# Patient Record
Sex: Male | Born: 1970 | Race: White | Hispanic: No | Marital: Married | Smoking: Never smoker
Health system: Southern US, Community
[De-identification: ages and names within clinical notes are randomized; demographics above are authoritative.]

## PROBLEM LIST (undated history)

## (undated) DIAGNOSIS — I4510 Unspecified right bundle-branch block: Secondary | ICD-10-CM

## (undated) DIAGNOSIS — K219 Gastro-esophageal reflux disease without esophagitis: Secondary | ICD-10-CM

## (undated) DIAGNOSIS — M722 Plantar fascial fibromatosis: Secondary | ICD-10-CM

## (undated) DIAGNOSIS — G4733 Obstructive sleep apnea (adult) (pediatric): Secondary | ICD-10-CM

## (undated) DIAGNOSIS — E669 Obesity, unspecified: Secondary | ICD-10-CM

## (undated) DIAGNOSIS — M109 Gout, unspecified: Secondary | ICD-10-CM

## (undated) DIAGNOSIS — Z6841 Body Mass Index (BMI) 40.0 and over, adult: Secondary | ICD-10-CM

## (undated) DIAGNOSIS — Z8739 Personal history of other diseases of the musculoskeletal system and connective tissue: Secondary | ICD-10-CM

## (undated) DIAGNOSIS — R5383 Other fatigue: Secondary | ICD-10-CM

## (undated) DIAGNOSIS — T4145XA Adverse effect of unspecified anesthetic, initial encounter: Secondary | ICD-10-CM

## (undated) DIAGNOSIS — I509 Heart failure, unspecified: Secondary | ICD-10-CM

## (undated) DIAGNOSIS — E7800 Pure hypercholesterolemia, unspecified: Secondary | ICD-10-CM

## (undated) DIAGNOSIS — I10 Essential (primary) hypertension: Secondary | ICD-10-CM

## (undated) DIAGNOSIS — R0789 Other chest pain: Secondary | ICD-10-CM

## (undated) DIAGNOSIS — E119 Type 2 diabetes mellitus without complications: Secondary | ICD-10-CM

## (undated) DIAGNOSIS — E349 Endocrine disorder, unspecified: Secondary | ICD-10-CM

## (undated) DIAGNOSIS — N529 Male erectile dysfunction, unspecified: Secondary | ICD-10-CM

## (undated) DIAGNOSIS — Z9989 Dependence on other enabling machines and devices: Secondary | ICD-10-CM

## (undated) DIAGNOSIS — I2510 Atherosclerotic heart disease of native coronary artery without angina pectoris: Secondary | ICD-10-CM

## (undated) DIAGNOSIS — R9431 Abnormal electrocardiogram [ECG] [EKG]: Secondary | ICD-10-CM

## (undated) HISTORY — DX: Other fatigue: R53.83

## (undated) HISTORY — DX: Body Mass Index (BMI) 40.0 and over, adult: Z684

## (undated) HISTORY — DX: Gastro-esophageal reflux disease without esophagitis: K21.9

## (undated) HISTORY — DX: Other chest pain: R07.89

## (undated) HISTORY — DX: Essential (primary) hypertension: I10

## (undated) HISTORY — DX: Plantar fascial fibromatosis: M72.2

## (undated) HISTORY — DX: Obesity, unspecified: E66.9

## (undated) HISTORY — DX: Endocrine disorder, unspecified: E34.9

## (undated) HISTORY — DX: Male erectile dysfunction, unspecified: N52.9

## (undated) HISTORY — DX: Gout, unspecified: M10.9

## (undated) HISTORY — DX: Abnormal electrocardiogram (ECG) (EKG): R94.31

## (undated) HISTORY — PX: LUMBAR SPINE SURGERY: SHX701

## (undated) HISTORY — DX: Unspecified right bundle-branch block: I45.10

## (undated) HISTORY — DX: Obstructive sleep apnea (adult) (pediatric): G47.33

## (undated) HISTORY — PX: BACK SURGERY: SHX140

## (undated) HISTORY — PX: KNEE ARTHROSCOPY: SHX127

---

## 1993-06-19 DIAGNOSIS — T8859XA Other complications of anesthesia, initial encounter: Secondary | ICD-10-CM

## 1993-06-19 HISTORY — DX: Other complications of anesthesia, initial encounter: T88.59XA

## 1993-06-19 HISTORY — PX: VASECTOMY: SHX75

## 2015-03-08 ENCOUNTER — Ambulatory Visit (INDEPENDENT_AMBULATORY_CARE_PROVIDER_SITE_OTHER): Payer: Managed Care, Other (non HMO) | Admitting: Cardiovascular Disease

## 2015-03-08 ENCOUNTER — Encounter: Payer: Self-pay | Admitting: Cardiovascular Disease

## 2015-03-08 VITALS — BP 130/100 | HR 93 | Ht 75.0 in | Wt 386.8 lb

## 2015-03-08 DIAGNOSIS — M722 Plantar fascial fibromatosis: Secondary | ICD-10-CM | POA: Insufficient documentation

## 2015-03-08 DIAGNOSIS — E349 Endocrine disorder, unspecified: Secondary | ICD-10-CM | POA: Insufficient documentation

## 2015-03-08 DIAGNOSIS — M109 Gout, unspecified: Secondary | ICD-10-CM | POA: Insufficient documentation

## 2015-03-08 DIAGNOSIS — R0789 Other chest pain: Secondary | ICD-10-CM | POA: Insufficient documentation

## 2015-03-08 DIAGNOSIS — I4510 Unspecified right bundle-branch block: Secondary | ICD-10-CM

## 2015-03-08 DIAGNOSIS — R5383 Other fatigue: Secondary | ICD-10-CM | POA: Insufficient documentation

## 2015-03-08 DIAGNOSIS — I10 Essential (primary) hypertension: Secondary | ICD-10-CM | POA: Diagnosis not present

## 2015-03-08 DIAGNOSIS — N529 Male erectile dysfunction, unspecified: Secondary | ICD-10-CM | POA: Insufficient documentation

## 2015-03-08 DIAGNOSIS — G4733 Obstructive sleep apnea (adult) (pediatric): Secondary | ICD-10-CM | POA: Insufficient documentation

## 2015-03-08 DIAGNOSIS — R9431 Abnormal electrocardiogram [ECG] [EKG]: Secondary | ICD-10-CM

## 2015-03-08 DIAGNOSIS — K219 Gastro-esophageal reflux disease without esophagitis: Secondary | ICD-10-CM | POA: Insufficient documentation

## 2015-03-08 DIAGNOSIS — E669 Obesity, unspecified: Secondary | ICD-10-CM | POA: Insufficient documentation

## 2015-03-08 NOTE — Patient Instructions (Addendum)
Estelle Grumbles, Nutritionist  [phone]   Medication Instructions:  Your physician recommends that you continue on your current medications as directed. Please refer to the Current Medication list given to you today.   Labwork: None Ordered   Testing/Procedures: None Ordered   Follow-Up: Your physician recommends that you schedule a follow-up appointment in: as needed with Dr. Acie Fredrickson.

## 2015-03-08 NOTE — Progress Notes (Signed)
Cardiology Office Note   Date:  03/08/2015   ID:  Joe [name], DOB July 13, 1970, MRN [id number]  PCP:  Fae Pippin  Cardiologist:   Nahser, Wonda Cheng, MD   Chief Complaint  Patient presents with  . Abnormal ECG   Problem LIst 1. RBBB 2. Chronic back pain  3.  Mild OSA      History of Present Illness: Joe [name] is a 44 y.o. male who presents for evaluation of a RBBB that was noted incidentally on has physical.    Has occasional episodes of musculoskeletal type chest pain. Does not get any regular exercise.  His job is occasionally active.   Has cut back on his salt recently .    eatslots of fast foods. Most lunches are fast food  Does not have any CP with exertion. Has some DOE .      Past Medical History  Diagnosis Date  . Fatigue   . Obstructive sleep apnea   . Plantar fasciitis   . GERD (gastroesophageal reflux disease)   . Erectile dysfunction   . Essential hypertension, benign   . Obesity   . Testosterone deficiency   . Gout   . Abnormal EKG   . Right bundle branch block   . Muscular chest pain   . Body mass index 45.0-49.9, adult     Past Surgical History  Procedure Laterality Date  . Vasectomy       Current Outpatient Prescriptions  Medication Sig Dispense Refill  . allopurinol (ZYLOPRIM) 100 MG tablet Take 100 mg by mouth daily.    . indomethacin (INDOCIN) 50 MG capsule Take 50 mg by mouth 3 (three) times daily as needed.     . meloxicam (MOBIC) 15 MG tablet Take 15 mg by mouth daily.    Marland Kitchen omeprazole (PRILOSEC) 40 MG capsule Take 40 mg by mouth daily.    . TESTOSTERONE IM Inject 200 mg into the muscle every 14 (fourteen) days.     No current facility-administered medications for this visit.    Allergies:   Review of patient's allergies indicates no known allergies.    Social History:  The patient  reports that he has never smoked. He does not have any smokeless tobacco history on file.   Family History:  The patient's  family history includes Diabetes Mellitus II in his brother and paternal grandfather; Lymphoma in his father.    ROS:  Please see the history of present illness.    Review of Systems: Constitutional:  denies fever, chills, diaphoresis, appetite change and fatigue.  HEENT: denies photophobia, eye pain, redness, hearing loss, ear pain, congestion, sore throat, rhinorrhea, sneezing, neck pain, neck stiffness and tinnitus.  Respiratory: admits to  DOE,    Cardiovascular: denies chest pain, palpitations and leg swelling.  Gastrointestinal: denies nausea, vomiting, abdominal pain, diarrhea, constipation, blood in stool.  Genitourinary: denies dysuria, urgency, frequency, hematuria, flank pain and difficulty urinating.  Musculoskeletal: denies  myalgias, back pain, joint swelling, arthralgias and gait problem.   Skin: denies pallor, rash and wound.  Neurological: denies dizziness, seizures, syncope, weakness, light-headedness, numbness and headaches.   Hematological: denies adenopathy, easy bruising, personal or family bleeding history.  Psychiatric/ Behavioral: denies suicidal ideation, mood changes, confusion, nervousness, sleep disturbance and agitation.       All other systems are reviewed and negative.    PHYSICAL EXAM: VS:  BP 130/100 mmHg  Pulse 93  Ht 6\' 3"  (1.905 m)  Wt 175.451 kg (386 lb 12.8 oz)  BMI 48.35 kg/m2 , BMI Body mass index is 48.35 kg/(m^2). GEN: Well nourished, well developed, in no acute distress HEENT: normal Neck: no JVD, carotid bruits, or masses Cardiac: RRR; no murmurs, rubs, or gallops,no edema  Respiratory:  clear to auscultation bilaterally, normal work of breathing GI: soft, nontender, nondistended, + BS MS: no deformity or atrophy Skin: warm and dry, no rash Neuro:  Strength and sensation are intact Psych: normal   EKG:  EKG is ordered today. The ekg ordered today demonstrates NSR,  RBBB   Recent Labs: No results found for requested labs  within last 365 days.    Lipid Panel No results found for: CHOL, TRIG, HDL, CHOLHDL, VLDL, LDLCALC, LDLDIRECT    Wt Readings from Last 3 Encounters:  03/08/15 175.451 kg (386 lb 12.8 oz)      Other studies Reviewed: Additional studies/ records that were reviewed today include: . Review of the above records demonstrates:    ASSESSMENT AND PLAN:  1.  Right bundle branch block: Patient presents with right bundle branch block. At this point he does not have any signs or symptoms of congestive heart failure or coronary artery disease. I've reassured him that this is a benign condition at this time. He'll be getting EKGs on a regular basis. Ive recommended that he start a regular exercise program. I've advised him to call me back if he has any episodes of chest pain or shortness breath and is not able to advance in his exercise program. We'll see him back on an as-needed basis.    2. Elevated blood pressure: The patient has intermittent episodes of elevated blood pressure. He eats a relatively high salt diet. He eats fast food 3 orally. I've recommended that he take his lunch to work. Recommended weight loss program.     Current medicines are reviewed at length with the patient today.  The patient does not have concerns regarding medicines.  The following changes have been made:  no change  Labs/ tests ordered today include:  No orders of the defined types were placed in this encounter.     Disposition:   FU with me as needed      Nahser, Wonda Cheng, MD  03/08/2015 10:26 AM    [facility] [place], [place], [place]  [address] Phone: [phone]; Fax: [phone]   [facility]  [address] [place], [place]  [address] [phone]   Fax [phone]

## 2015-03-31 ENCOUNTER — Ambulatory Visit: Payer: Self-pay | Admitting: Cardiology

## 2015-08-16 DIAGNOSIS — M79642 Pain in left hand: Secondary | ICD-10-CM | POA: Insufficient documentation

## 2015-08-16 DIAGNOSIS — G5602 Carpal tunnel syndrome, left upper limb: Secondary | ICD-10-CM | POA: Insufficient documentation

## 2015-08-27 DIAGNOSIS — G5601 Carpal tunnel syndrome, right upper limb: Secondary | ICD-10-CM | POA: Insufficient documentation

## 2017-08-31 ENCOUNTER — Telehealth: Payer: Self-pay

## 2017-08-31 NOTE — Telephone Encounter (Signed)
REFERRAL SENT TO [place]

## 2017-09-05 ENCOUNTER — Ambulatory Visit (HOSPITAL_BASED_OUTPATIENT_CLINIC_OR_DEPARTMENT_OTHER)
Admission: RE | Admit: 2017-09-05 | Discharge: 2017-09-05 | Disposition: A | Discharge status: Home / Self Care | Payer: BLUE CROSS/BLUE SHIELD | Source: Ambulatory Visit | Attending: Cardiology | Admitting: Cardiology

## 2017-09-05 ENCOUNTER — Encounter: Payer: Self-pay | Admitting: Cardiology

## 2017-09-05 ENCOUNTER — Ambulatory Visit (INDEPENDENT_AMBULATORY_CARE_PROVIDER_SITE_OTHER): Payer: BLUE CROSS/BLUE SHIELD | Admitting: Cardiology

## 2017-09-05 VITALS — BP 120/88 | HR 112 | Ht 75.0 in | Wt 386.8 lb

## 2017-09-05 DIAGNOSIS — R0602 Shortness of breath: Secondary | ICD-10-CM

## 2017-09-05 DIAGNOSIS — R918 Other nonspecific abnormal finding of lung field: Secondary | ICD-10-CM | POA: Insufficient documentation

## 2017-09-05 DIAGNOSIS — G4733 Obstructive sleep apnea (adult) (pediatric): Secondary | ICD-10-CM | POA: Diagnosis not present

## 2017-09-05 DIAGNOSIS — R9431 Abnormal electrocardiogram [ECG] [EKG]: Secondary | ICD-10-CM

## 2017-09-05 DIAGNOSIS — I209 Angina pectoris, unspecified: Secondary | ICD-10-CM | POA: Insufficient documentation

## 2017-09-05 DIAGNOSIS — R072 Precordial pain: Secondary | ICD-10-CM | POA: Diagnosis not present

## 2017-09-05 DIAGNOSIS — I10 Essential (primary) hypertension: Secondary | ICD-10-CM

## 2017-09-05 DIAGNOSIS — E118 Type 2 diabetes mellitus with unspecified complications: Secondary | ICD-10-CM | POA: Diagnosis not present

## 2017-09-05 DIAGNOSIS — I4510 Unspecified right bundle-branch block: Secondary | ICD-10-CM

## 2017-09-05 DIAGNOSIS — R079 Chest pain, unspecified: Secondary | ICD-10-CM

## 2017-09-05 HISTORY — DX: Angina pectoris, unspecified: I20.9

## 2017-09-05 HISTORY — DX: Shortness of breath: R06.02

## 2017-09-05 HISTORY — DX: Type 2 diabetes mellitus with unspecified complications: E11.8

## 2017-09-05 MED ORDER — ASPIRIN EC 81 MG PO TBEC: 81.0000 mg | DELAYED_RELEASE_TABLET | Freq: Every day | ORAL | 3 refills | Status: DC

## 2017-09-05 MED ORDER — ROSUVASTATIN CALCIUM 40 MG PO TABS: 40.0000 mg | ORAL_TABLET | Freq: Every evening | ORAL | 3 refills | Status: DC

## 2017-09-05 MED ORDER — NITROGLYCERIN 0.4 MG SL SUBL: 0.4000 mg | SUBLINGUAL_TABLET | SUBLINGUAL | 11 refills | Status: DC | PRN

## 2017-09-05 NOTE — Progress Notes (Signed)
Cardiology Consultation:    Date:  09/05/2017   ID:  Joe [name], DOB 11/13/1970, MRN [id number]  PCP:  Cyndi Bender, PA-C  Cardiologist:  Jenne Campus, MD   Referring MD: Cyndi Bender, PA-C   Chief Complaint  Patient presents with  . Shortness of Breath  . Chest Pain  I have a chest pain  History of Present Illness:    Joe [name] is a 47 y.o. male who is being seen today for the evaluation of chest pain at the request of Cyndi Bender, PA-C.  For last 2 weeks he experienced exertional chest tightness.  This is associated with significant shortness of breath.  He comes to my office with his wife and she clearly states that when he goes outside goes from one building to the other building he will get tightness in the chest to the point that he needed to slow down and also significant shortness of breath.  He kind of down complained of symptoms he said he had it all the time however he admits that definitely within last 2 weeks this situation became significantly worse.  He ended up going to his primary care physician who recognized new onset of diabetes as well as hypertension and started appropriate therapy for it.  He was also referred to [place].  Also one episode of chest pain happening at rest.  That happened a few days ago.  Not exercise on the regular basis he is at work and does not required much physical effort. Have history of significant sleep apnea however have difficulty with the equipment and basically does not use any CPAP mask now.  He is also morbidly obese.  Past Medical History:  Diagnosis Date  . Abnormal EKG   . Body mass index 45.0-49.9, adult ([place])   . Diabetes mellitus without complication ([place])   . Erectile dysfunction   . Essential hypertension, benign   . Fatigue   . GERD (gastroesophageal reflux disease)   . Gout   . Muscular chest pain   . Obesity   . Obstructive sleep apnea   . Plantar fasciitis   . Right bundle branch block   .  Testosterone deficiency     Past Surgical History:  Procedure Laterality Date  . BACK SURGERY    . KNEE SURGERY    . VASECTOMY      Current Medications: Current Meds  Medication Sig  . allopurinol (ZYLOPRIM) 100 MG tablet Take 100 mg by mouth daily.  . indomethacin (INDOCIN) 50 MG capsule Take 50 mg by mouth 3 (three) times daily as needed.   . meloxicam (MOBIC) 15 MG tablet Take 15 mg by mouth daily.  . metoprolol succinate (TOPROL-XL) 25 MG 24 hr tablet Take 1 tablet by mouth daily.  Marland Kitchen omeprazole (PRILOSEC) 40 MG capsule Take 40 mg by mouth daily.  . TESTOSTERONE IM Inject 200 mg into the muscle every 14 (fourteen) days.     Allergies:   Patient has no known allergies.   Social History   Socioeconomic History  . Marital status: Married    Spouse name: None  . Number of children: None  . Years of education: None  . Highest education level: None  Social Needs  . Financial resource strain: None  . Food insecurity - worry: None  . Food insecurity - inability: None  . Transportation needs - medical: None  . Transportation needs - non-medical: None  Occupational History  . None  Tobacco Use  . Smoking status: Never  Smoker  . Smokeless tobacco: Never Used  Substance and Sexual Activity  . Alcohol use: No    Frequency: Never  . Drug use: No  . Sexual activity: None  Other Topics Concern  . None  Social History Narrative  . None     Family History: The patient's family history includes Brain cancer in his mother; Diabetes Mellitus II in his brother and paternal grandfather; Lung cancer in his mother; Lymphoma in his father. ROS:   Please see the history of present illness.    All 14 point review of systems negative except as described per history of present illness.  EKGs/Labs/Other Studies Reviewed:    The following studies were reviewed today:   EKG:  EKG is  ordered today.  The ekg ordered today demonstrates normal sinus rhythm normal PR interval, right  bundle branch block left anterior hemiblock.  Recent Labs: No results found for requested labs within last 8760 hours.  Recent Lipid Panel No results found for: CHOL, TRIG, HDL, CHOLHDL, VLDL, LDLCALC, LDLDIRECT  Physical Exam:    VS:  BP 120/88   Pulse (!) 112   Ht 6\' 3"  (1.905 m)   Wt (!) 386 lb 12.8 oz (175.5 kg)   SpO2 95%   BMI 48.35 kg/m     Wt Readings from Last 3 Encounters:  09/05/17 (!) 386 lb 12.8 oz (175.5 kg)  03/08/15 (!) 386 lb 12.8 oz (175.5 kg)     GEN:  Well nourished, well developed in no acute distress HEENT: Normal NECK: No JVD; No carotid bruits LYMPHATICS: No lymphadenopathy CARDIAC: RRR, no murmurs, no rubs, no gallops RESPIRATORY:  Clear to auscultation without rales, wheezing or rhonchi  ABDOMEN: Soft, non-tender, non-distended MUSCULOSKELETAL:  No edema; No deformity  SKIN: Warm and dry NEUROLOGIC:  Alert and oriented x 3 PSYCHIATRIC:  Normal affect   ASSESSMENT:    1. Essential hypertension, benign   2. Type 2 diabetes mellitus with complication, without long-term current use of insulin ([place])   3. Right bundle branch block   4. Obstructive sleep apnea   5. Class 3 severe obesity due to excess calories without serious comorbidity in adult, unspecified BMI ([place])   6. Precordial pain    PLAN:    In order of problems listed above:  1. Exertional chest pain very suspicious for angina pectoris.  He does have multiple risk factors for coronary artery disease.  We discussed options for this situation option being medical therapy versus stress testing versus cardiac catheterization.  In his situation with his symptomatology in my opinion the best approach will be to proceed with cardiac catheterization.  Procedure was explained to him including all risk benefits as well as alternatives.  We will proceed.  In the meantime I will initiate aspirin.  He was given aspirin by his primary care physician but did not have a chance to take it I gave him samples  today and will initiate this medication right away.  Will give him nitroglycerin prescription.  He is already on beta-blocker which I will continue.  He denies having any recent chest pain.  Also told him until we do cardiac catheterization not to exert himself.  I will also start statin therapy.  His LDL was 110.  I will initiate Crestor 40 mg daily. 2. Type 2 diabetes: Recently recognized.  Metformin has been initiated. 3. Obstructive sleep apnea which appears to be severe.  Unable to tolerate mask.  Will continue discussion regarding that problem after he  comes back after cardiac catheterization. 4. Obesity: Significant problem I told him right away if he will be able to lose about 50 pounds his diabetes will likely significantly improve his cholesterol will be better overall his well-being will improve dramatically.  But again this is discussion that we will continue after cardiac catheterization will be done.  Apparently he sign up for the gym however I told him not to go there until the cardiac catheterization   Medication Adjustments/Labs and Tests Ordered: Current medicines are reviewed at length with the patient today.  Concerns regarding medicines are outlined above.  No orders of the defined types were placed in this encounter.  No orders of the defined types were placed in this encounter.   Signed, Park Liter, MD, [facility]. 09/05/2017 3:22 PM    [place]

## 2017-09-05 NOTE — Patient Instructions (Addendum)
Medication Instructions:  Your physician has recommended you make the following change in your medication:  START crestor 40 mg daily START aspirin 81 mg daily START nitroglycerin as needed for chest pain. If you start having chest pain, stop what you are doing and sit down. Put a nitroglycerin tablet under your tongue and let it dissolve. Wait 5 minutes. If you are still having chest pain, this can be done 2 more times. After the third nitroglycerin, call 911.   Labwork: Your physician recommends that you return for lab work today: INR  Testing/Procedures:   [facility] [address] [place] [address] Dept: [phone] Loc: [place]  09/05/2017  You are scheduled for a Cardiac Catheterization on Thursday, March 21 with Dr. Larae Grooms.  1. Please arrive at the [facility] (Main Entrance A) at [facility]: [address] [place], [place] [address] at 5:30 AM (two hours before your procedure to ensure your preparation). Free valet parking service is available.   Special note: Every effort is made to have your procedure done on time. Please understand that emergencies sometimes delay scheduled procedures.  2. Diet: Do not eat or drink anything after midnight prior to your procedure except sips of water to take medications.  3. Labs: None needed.  4. Medication instructions in preparation for your procedure:   Current Outpatient Medications (Endocrine & Metabolic):  .  TESTOSTERONE IM, Inject 200 mg into the muscle every 14 (fourteen) days. .  metFORMIN (GLUCOPHAGE-XR) 500 MG 24 hr tablet, Take 2 tablets by mouth 2 (two) times daily.  Current Outpatient Medications (Cardiovascular):  .  metoprolol succinate (TOPROL-XL) 25 MG 24 hr tablet, Take 1 tablet by mouth daily. .  nitroGLYCERIN (NITROSTAT) 0.4 MG SL tablet, Place 1 tablet (0.4 mg total) under  the tongue every 5 (five) minutes as needed for chest pain. .  rosuvastatin (CRESTOR) 40 MG tablet, Take 1 tablet (40 mg total) by mouth every evening.   Current Outpatient Medications (Analgesics):  .  allopurinol (ZYLOPRIM) 100 MG tablet, Take 100 mg by mouth daily. .  indomethacin (INDOCIN) 50 MG capsule, Take 50 mg by mouth 3 (three) times daily as needed.  .  meloxicam (MOBIC) 15 MG tablet, Take 15 mg by mouth daily. Marland Kitchen  aspirin EC 81 MG tablet, Take 1 tablet (81 mg total) by mouth daily.   Current Outpatient Medications (Other):  .  omeprazole (PRILOSEC) 40 MG capsule, Take 40 mg by mouth daily. *For reference purposes while preparing patient instructions.   Delete this med list prior to printing instructions for patient.*  Stop taking, Glucophage (Metformin) on Thursday, March 20.  Hold for 48 hours after procedure.   On the morning of your procedure, take your Aspirin and any morning medicines NOT listed above.  You may use sips of water.  5. Plan for one night stay--bring personal belongings. 6. Bring a current list of your medications and current insurance cards. 7. You MUST have a responsible person to drive you home. 8. Someone MUST be with you the first 24 hours after you arrive home or your discharge will be delayed. 9. Please wear clothes that are easy to get on and off and wear slip-on shoes.  Thank you for allowing [place] to care for you!   -- [facility]   Follow-Up: Your physician recommends that you schedule a follow-up appointment in: 1 month.  Any Other Special Instructions Will Be Listed Below (If Applicable).     If you need a refill on your cardiac medications before your next appointment, please call your pharmacy.

## 2017-09-06 ENCOUNTER — Other Ambulatory Visit (HOSPITAL_COMMUNITY): Payer: BLUE CROSS/BLUE SHIELD

## 2017-09-06 ENCOUNTER — Encounter (HOSPITAL_COMMUNITY): Payer: Self-pay | Admitting: Interventional Cardiology

## 2017-09-06 ENCOUNTER — Inpatient Hospital Stay (HOSPITAL_COMMUNITY)
Admission: AD | Disposition: A | Discharge status: Home / Self Care | Payer: Self-pay | Source: Ambulatory Visit | Attending: Interventional Cardiology

## 2017-09-06 ENCOUNTER — Inpatient Hospital Stay (HOSPITAL_COMMUNITY)
Admission: AD | Admit: 2017-09-06 | Discharge: 2017-09-08 | DRG: 246 | Disposition: A | Discharge status: Home / Self Care | Payer: BLUE CROSS/BLUE SHIELD | Source: Ambulatory Visit | Attending: Interventional Cardiology | Admitting: Interventional Cardiology

## 2017-09-06 ENCOUNTER — Other Ambulatory Visit: Payer: Self-pay

## 2017-09-06 DIAGNOSIS — I10 Essential (primary) hypertension: Secondary | ICD-10-CM | POA: Diagnosis present

## 2017-09-06 DIAGNOSIS — I2510 Atherosclerotic heart disease of native coronary artery without angina pectoris: Secondary | ICD-10-CM | POA: Diagnosis not present

## 2017-09-06 DIAGNOSIS — E669 Obesity, unspecified: Secondary | ICD-10-CM | POA: Diagnosis present

## 2017-09-06 DIAGNOSIS — Z807 Family history of other malignant neoplasms of lymphoid, hematopoietic and related tissues: Secondary | ICD-10-CM

## 2017-09-06 DIAGNOSIS — E118 Type 2 diabetes mellitus with unspecified complications: Secondary | ICD-10-CM | POA: Diagnosis not present

## 2017-09-06 DIAGNOSIS — K219 Gastro-esophageal reflux disease without esophagitis: Secondary | ICD-10-CM | POA: Diagnosis present

## 2017-09-06 DIAGNOSIS — Z833 Family history of diabetes mellitus: Secondary | ICD-10-CM

## 2017-09-06 DIAGNOSIS — I255 Ischemic cardiomyopathy: Secondary | ICD-10-CM | POA: Diagnosis present

## 2017-09-06 DIAGNOSIS — I209 Angina pectoris, unspecified: Secondary | ICD-10-CM | POA: Diagnosis present

## 2017-09-06 DIAGNOSIS — E119 Type 2 diabetes mellitus without complications: Secondary | ICD-10-CM | POA: Diagnosis present

## 2017-09-06 DIAGNOSIS — Z9861 Coronary angioplasty status: Secondary | ICD-10-CM

## 2017-09-06 DIAGNOSIS — I5021 Acute systolic (congestive) heart failure: Secondary | ICD-10-CM

## 2017-09-06 DIAGNOSIS — G4733 Obstructive sleep apnea (adult) (pediatric): Secondary | ICD-10-CM | POA: Diagnosis present

## 2017-09-06 DIAGNOSIS — Z6841 Body Mass Index (BMI) 40.0 and over, adult: Secondary | ICD-10-CM | POA: Diagnosis not present

## 2017-09-06 DIAGNOSIS — Z801 Family history of malignant neoplasm of trachea, bronchus and lung: Secondary | ICD-10-CM

## 2017-09-06 DIAGNOSIS — I5023 Acute on chronic systolic (congestive) heart failure: Secondary | ICD-10-CM | POA: Diagnosis present

## 2017-09-06 DIAGNOSIS — I110 Hypertensive heart disease with heart failure: Secondary | ICD-10-CM | POA: Diagnosis present

## 2017-09-06 DIAGNOSIS — I4510 Unspecified right bundle-branch block: Secondary | ICD-10-CM | POA: Diagnosis present

## 2017-09-06 DIAGNOSIS — I340 Nonrheumatic mitral (valve) insufficiency: Secondary | ICD-10-CM | POA: Diagnosis not present

## 2017-09-06 DIAGNOSIS — M109 Gout, unspecified: Secondary | ICD-10-CM | POA: Diagnosis present

## 2017-09-06 DIAGNOSIS — R0602 Shortness of breath: Secondary | ICD-10-CM | POA: Diagnosis present

## 2017-09-06 DIAGNOSIS — I429 Cardiomyopathy, unspecified: Secondary | ICD-10-CM | POA: Diagnosis not present

## 2017-09-06 DIAGNOSIS — I25119 Atherosclerotic heart disease of native coronary artery with unspecified angina pectoris: Secondary | ICD-10-CM | POA: Diagnosis present

## 2017-09-06 DIAGNOSIS — Z955 Presence of coronary angioplasty implant and graft: Secondary | ICD-10-CM

## 2017-09-06 DIAGNOSIS — Z808 Family history of malignant neoplasm of other organs or systems: Secondary | ICD-10-CM | POA: Diagnosis not present

## 2017-09-06 HISTORY — DX: Type 2 diabetes mellitus without complications: E11.9

## 2017-09-06 HISTORY — PX: CORONARY ANGIOPLASTY WITH STENT PLACEMENT: SHX49

## 2017-09-06 HISTORY — PX: LEFT HEART CATH AND CORONARY ANGIOGRAPHY: CATH118249

## 2017-09-06 HISTORY — DX: Obstructive sleep apnea (adult) (pediatric): G47.33

## 2017-09-06 HISTORY — DX: Pure hypercholesterolemia, unspecified: E78.00

## 2017-09-06 HISTORY — PX: CORONARY STENT INTERVENTION: CATH118234

## 2017-09-06 HISTORY — DX: Dependence on other enabling machines and devices: Z99.89

## 2017-09-06 HISTORY — DX: Personal history of other diseases of the musculoskeletal system and connective tissue: Z87.39

## 2017-09-06 HISTORY — DX: Atherosclerotic heart disease of native coronary artery without angina pectoris: I25.10

## 2017-09-06 HISTORY — DX: Acute systolic (congestive) heart failure: I50.21

## 2017-09-06 HISTORY — DX: Adverse effect of unspecified anesthetic, initial encounter: T41.45XA

## 2017-09-06 LAB — GLUCOSE, CAPILLARY
Glucose-Capillary: 217 mg/dL — ABNORMAL HIGH (ref 65–99)
Glucose-Capillary: 218 mg/dL — ABNORMAL HIGH (ref 65–99)
Glucose-Capillary: 263 mg/dL — ABNORMAL HIGH (ref 65–99)
Glucose-Capillary: 251 mg/dL — ABNORMAL HIGH (ref 65–99)
Glucose-Capillary: 189 mg/dL — ABNORMAL HIGH (ref 65–99)

## 2017-09-06 LAB — PROTIME-INR
INR: 1 (ref 0.8–1.2)
INR: 1.03
Prothrombin Time: 10.7 s (ref 9.1–12.0)
Prothrombin Time: 13.4 seconds (ref 11.4–15.2)

## 2017-09-06 LAB — POCT ACTIVATED CLOTTING TIME
Activated Clotting Time: 252 seconds
Activated Clotting Time: 417 seconds

## 2017-09-06 SURGERY — LEFT HEART CATH AND CORONARY ANGIOGRAPHY
Anesthesia: LOCAL

## 2017-09-06 MED ORDER — ALLOPURINOL 100 MG PO TABS: 100.0000 mg | ORAL_TABLET | Freq: Every day | ORAL | Status: DC | PRN

## 2017-09-06 MED ORDER — FENTANYL CITRATE (PF) 100 MCG/2ML IJ SOLN
INTRAMUSCULAR | Status: AC
  Filled 2017-09-06: qty 2

## 2017-09-06 MED ORDER — NITROGLYCERIN 1 MG/10 ML FOR IR/CATH LAB
INTRA_ARTERIAL | Status: DC | PRN
  Administered 2017-09-06 (×2): 200 ug via INTRACORONARY
  Administered 2017-09-06: 100 ug via INTRACORONARY

## 2017-09-06 MED ORDER — SODIUM CHLORIDE 0.9% FLUSH: 3.0000 mL | INTRAVENOUS | Status: DC | PRN

## 2017-09-06 MED ORDER — SODIUM CHLORIDE 0.9 % IV SOLN: 250.0000 mL | INTRAVENOUS | Status: DC | PRN

## 2017-09-06 MED ORDER — HEPARIN SODIUM (PORCINE) 1000 UNIT/ML IJ SOLN
INTRAMUSCULAR | Status: DC | PRN
  Administered 2017-09-06: 10000 [IU] via INTRAVENOUS
  Administered 2017-09-06: 6000 [IU] via INTRAVENOUS
  Administered 2017-09-06: 4000 [IU] via INTRAVENOUS

## 2017-09-06 MED ORDER — TIROFIBAN (AGGRASTAT) BOLUS VIA INFUSION
INTRAVENOUS | Status: DC | PRN
  Administered 2017-09-06: 4377.5 ug via INTRAVENOUS

## 2017-09-06 MED ORDER — IOPAMIDOL (ISOVUE-370) INJECTION 76%
INTRAVENOUS | Status: AC
  Filled 2017-09-06: qty 100

## 2017-09-06 MED ORDER — TICAGRELOR 90 MG PO TABS
ORAL_TABLET | ORAL | Status: AC
  Filled 2017-09-06: qty 2

## 2017-09-06 MED ORDER — ASPIRIN 81 MG PO CHEW: 81.0000 mg | CHEWABLE_TABLET | ORAL | Status: DC

## 2017-09-06 MED ORDER — SODIUM CHLORIDE 0.9% FLUSH
3.0000 mL | Freq: Two times a day (BID) | INTRAVENOUS | Status: DC
  Administered 2017-09-06: 3 mL via INTRAVENOUS

## 2017-09-06 MED ORDER — ONDANSETRON HCL 4 MG/2ML IJ SOLN: 4.0000 mg | Freq: Four times a day (QID) | INTRAMUSCULAR | Status: DC | PRN

## 2017-09-06 MED ORDER — SODIUM CHLORIDE 0.9 % WEIGHT BASED INFUSION: 1.0000 mL/kg/h | INTRAVENOUS | Status: DC

## 2017-09-06 MED ORDER — IOPAMIDOL (ISOVUE-370) INJECTION 76%
INTRAVENOUS | Status: DC | PRN
  Administered 2017-09-06: 170 mL via INTRA_ARTERIAL

## 2017-09-06 MED ORDER — FENTANYL CITRATE (PF) 100 MCG/2ML IJ SOLN
INTRAMUSCULAR | Status: DC | PRN
  Administered 2017-09-06: 25 ug via INTRAVENOUS

## 2017-09-06 MED ORDER — HEPARIN SODIUM (PORCINE) 1000 UNIT/ML IJ SOLN
INTRAMUSCULAR | Status: AC
  Filled 2017-09-06: qty 1

## 2017-09-06 MED ORDER — ROSUVASTATIN CALCIUM 40 MG PO TABS
40.0000 mg | ORAL_TABLET | Freq: Every evening | ORAL | Status: DC
  Administered 2017-09-06 – 2017-09-07 (×2): 40 mg via ORAL
  Filled 2017-09-06: qty 1
  Filled 2017-09-06 (×2): qty 2
  Filled 2017-09-06: qty 1

## 2017-09-06 MED ORDER — ASPIRIN EC 81 MG PO TBEC
81.0000 mg | DELAYED_RELEASE_TABLET | Freq: Every day | ORAL | Status: DC
  Administered 2017-09-07 – 2017-09-08 (×2): 81 mg via ORAL
  Filled 2017-09-06 (×2): qty 1

## 2017-09-06 MED ORDER — TIROFIBAN HCL IN NACL 5-0.9 MG/100ML-% IV SOLN
INTRAVENOUS | Status: AC
  Filled 2017-09-06: qty 100

## 2017-09-06 MED ORDER — TICAGRELOR 90 MG PO TABS
90.0000 mg | ORAL_TABLET | Freq: Two times a day (BID) | ORAL | Status: DC
  Administered 2017-09-06 – 2017-09-08 (×4): 90 mg via ORAL
  Filled 2017-09-06 (×4): qty 1

## 2017-09-06 MED ORDER — NITROGLYCERIN 1 MG/10 ML FOR IR/CATH LAB
INTRA_ARTERIAL | Status: AC
  Filled 2017-09-06: qty 10

## 2017-09-06 MED ORDER — THE SENSUOUS HEART BOOK
Freq: Once | Status: AC
  Administered 2017-09-07: 04:00:00
  Filled 2017-09-06: qty 1

## 2017-09-06 MED ORDER — HEPARIN (PORCINE) IN NACL 2-0.9 UNIT/ML-% IJ SOLN
INTRAMUSCULAR | Status: AC | PRN
  Administered 2017-09-06 (×2): 500 mL

## 2017-09-06 MED ORDER — HEPARIN (PORCINE) IN NACL 2-0.9 UNIT/ML-% IJ SOLN
INTRAMUSCULAR | Status: AC
  Filled 2017-09-06: qty 1000

## 2017-09-06 MED ORDER — HYDRALAZINE HCL 20 MG/ML IJ SOLN: 5.0000 mg | INTRAMUSCULAR | Status: AC | PRN

## 2017-09-06 MED ORDER — METOPROLOL SUCCINATE ER 25 MG PO TB24
25.0000 mg | ORAL_TABLET | Freq: Every day | ORAL | Status: DC
  Administered 2017-09-07: 10:00:00 25 mg via ORAL
  Filled 2017-09-06: qty 1

## 2017-09-06 MED ORDER — INSULIN ASPART 100 UNIT/ML ~~LOC~~ SOLN
0.0000 [IU] | Freq: Three times a day (TID) | SUBCUTANEOUS | Status: DC
  Administered 2017-09-06 (×2): 8 [IU] via SUBCUTANEOUS
  Administered 2017-09-07 (×2): 5 [IU] via SUBCUTANEOUS
  Administered 2017-09-07: 18:00:00 3 [IU] via SUBCUTANEOUS

## 2017-09-06 MED ORDER — ACETAMINOPHEN 325 MG PO TABS: 650.0000 mg | ORAL_TABLET | ORAL | Status: DC | PRN

## 2017-09-06 MED ORDER — TICAGRELOR 90 MG PO TABS
ORAL_TABLET | ORAL | Status: DC | PRN
  Administered 2017-09-06: 180 mg via ORAL

## 2017-09-06 MED ORDER — METOPROLOL TARTRATE 5 MG/5ML IV SOLN
INTRAVENOUS | Status: AC
  Filled 2017-09-06: qty 5

## 2017-09-06 MED ORDER — SODIUM CHLORIDE 0.9 % WEIGHT BASED INFUSION
3.0000 mL/kg/h | INTRAVENOUS | Status: DC
  Administered 2017-09-06: 3 mL/kg/h via INTRAVENOUS

## 2017-09-06 MED ORDER — FUROSEMIDE 10 MG/ML IJ SOLN
40.0000 mg | Freq: Two times a day (BID) | INTRAMUSCULAR | Status: DC
  Administered 2017-09-06 – 2017-09-08 (×5): 40 mg via INTRAVENOUS
  Filled 2017-09-06 (×4): qty 4

## 2017-09-06 MED ORDER — ANGIOPLASTY BOOK
Freq: Once | Status: AC
  Administered 2017-09-07: 04:00:00
  Filled 2017-09-06: qty 1

## 2017-09-06 MED ORDER — TIROFIBAN HCL IN NACL 5-0.9 MG/100ML-% IV SOLN
INTRAVENOUS | Status: AC | PRN
  Administered 2017-09-06: 09:00:00
  Administered 2017-09-06: 0.15 ug/kg/min via INTRAVENOUS

## 2017-09-06 MED ORDER — LIDOCAINE HCL (PF) 1 % IJ SOLN
INTRAMUSCULAR | Status: AC
  Filled 2017-09-06: qty 30

## 2017-09-06 MED ORDER — NITROGLYCERIN 0.4 MG SL SUBL: 0.4000 mg | SUBLINGUAL_TABLET | SUBLINGUAL | Status: DC | PRN

## 2017-09-06 MED ORDER — PANTOPRAZOLE SODIUM 40 MG PO TBEC
40.0000 mg | DELAYED_RELEASE_TABLET | Freq: Every day | ORAL | Status: DC
  Administered 2017-09-07 – 2017-09-08 (×2): 40 mg via ORAL
  Filled 2017-09-06 (×2): qty 1

## 2017-09-06 MED ORDER — MIDAZOLAM HCL 2 MG/2ML IJ SOLN
INTRAMUSCULAR | Status: DC | PRN
  Administered 2017-09-06: 2 mg via INTRAVENOUS

## 2017-09-06 MED ORDER — MIDAZOLAM HCL 2 MG/2ML IJ SOLN
INTRAMUSCULAR | Status: AC
  Filled 2017-09-06: qty 2

## 2017-09-06 MED ORDER — FUROSEMIDE 10 MG/ML IJ SOLN
INTRAMUSCULAR | Status: AC
  Filled 2017-09-06: qty 4

## 2017-09-06 MED ORDER — VERAPAMIL HCL 2.5 MG/ML IV SOLN
INTRAVENOUS | Status: DC | PRN
  Administered 2017-09-06: 10 mL via INTRA_ARTERIAL

## 2017-09-06 MED ORDER — ASPIRIN 81 MG PO CHEW: 81.0000 mg | CHEWABLE_TABLET | Freq: Every day | ORAL | Status: DC

## 2017-09-06 MED ORDER — SODIUM CHLORIDE 0.9% FLUSH
3.0000 mL | Freq: Two times a day (BID) | INTRAVENOUS | Status: DC
  Administered 2017-09-06 – 2017-09-07 (×3): 3 mL via INTRAVENOUS

## 2017-09-06 MED ORDER — METOPROLOL TARTRATE 5 MG/5ML IV SOLN
INTRAVENOUS | Status: DC | PRN
  Administered 2017-09-06 (×2): 5 mg via INTRAVENOUS

## 2017-09-06 MED ORDER — LIDOCAINE HCL (PF) 1 % IJ SOLN
INTRAMUSCULAR | Status: DC | PRN
  Administered 2017-09-06: 2 mL

## 2017-09-06 MED ORDER — LABETALOL HCL 5 MG/ML IV SOLN: 10.0000 mg | INTRAVENOUS | Status: AC | PRN

## 2017-09-06 MED ORDER — VERAPAMIL HCL 2.5 MG/ML IV SOLN
INTRAVENOUS | Status: AC
  Filled 2017-09-06: qty 2

## 2017-09-06 SURGICAL SUPPLY — 19 items
BALLN SAPPHIRE 2.25X15 (BALLOONS) ×2
BALLOON SAPPHIRE 2.25X15 (BALLOONS) ×1 IMPLANT
BAND ZEPHYR COMPRESS 30 LONG (HEMOSTASIS) ×2 IMPLANT
CATH IMPULSE 5F ANG/FL3.5 (CATHETERS) ×2 IMPLANT
CATH VISTA GUIDE 6FR XBLAD3.5 (CATHETERS) ×2 IMPLANT
GUIDEWIRE INQWIRE 1.5J.035X260 (WIRE) ×1 IMPLANT
INQWIRE 1.5J .035X260CM (WIRE) ×2
KIT ENCORE 26 ADVANTAGE (KITS) ×2 IMPLANT
KIT HEART LEFT (KITS) ×2 IMPLANT
KIT HEMO VALVE WATCHDOG (MISCELLANEOUS) ×2 IMPLANT
NEEDLE PERC 21GX4CM (NEEDLE) ×2 IMPLANT
PACK CARDIAC CATHETERIZATION (CUSTOM PROCEDURE TRAY) ×2 IMPLANT
SHEATH RAIN RADIAL 21G 6FR (SHEATH) ×2 IMPLANT
SHIELD RADPAD SCOOP 12X17 (MISCELLANEOUS) ×2 IMPLANT
STENT SYNERGY DES 2.25X28 (Permanent Stent) ×2 IMPLANT
STENT SYNERGY DES 2.25X32 (Permanent Stent) ×2 IMPLANT
STENT SYNERGY DES 2.5X12 (Permanent Stent) ×2 IMPLANT
TRANSDUCER W/STOPCOCK (MISCELLANEOUS) ×2 IMPLANT
TUBING CIL FLEX 10 FLL-RA (TUBING) ×2 IMPLANT

## 2017-09-06 NOTE — Care Management Note (Addendum)
Case Management Note  Patient Details  Name: Joe [name] MRN: [id number] Date of Birth: 12/20/1970  Subjective/Objective:  Pt presented for Chest Pain- s/p cath. Plan for home on Brilinta. Benefits Check completed. CM did provide pt with [employer].  [facility] [place] and medication is available. No further needs from CM at this time.                  Action/Plan: S/W TRACY @ [facility] RX # [phone]    BRILINTA 90 MG BID  COVER- YES  CO-PAY- $ 30.00  TIER- 3 DRUG  PRIOR APPROVAL- NO   PREFERRED PHARM[facility]Y [facility]   Expected Discharge Date:                  Expected Discharge Plan:  Home/Self Care  In-House Referral:  NA  Discharge planning Services  CM Consult  Post Acute Care Choice:  NA Choice offered to:  NA  DME Arranged:  N/A DME Agency:  NA  HH Arranged:  NA HH Agency:  NA  Status of Service:  Completed, signed off  If discussed at [place] of Stay Meetings, dates discussed:    Additional Comments:  Bethena Roys, RN 09/06/2017, 4:15 PM

## 2017-09-06 NOTE — Progress Notes (Signed)
Patient arrived to unit with shortness of breath. O2 sats 99-100% on 2liters oxygen [place]. Reviewed information on Brilinta and shortness of breath experienced by some patients. Patient stated ,"I can't catch my breath." which is the exact description used by patients who experience the side effect. Patient given diet coke to drink and experienced some relief. He had several episodes of shortness of breath, relieved with sips of diet coke.  Episodes lessened and patient maintained O2 sats 98-100 on room air. Encouraged to try having a can of diet coke when taking Brilinta this evening to see if that would decrease symptoms. Patient and wife verbalizing good understanding of information given.

## 2017-09-06 NOTE — Progress Notes (Signed)
[facility] BAND REMOVAL  LOCATION:    right radial  DEFLATED PER PROTOCOL:    Yes.    TIME BAND OFF / DRESSING APPLIED:    1500   SITE UPON ARRIVAL:    Level 0  SITE AFTER BAND REMOVAL:    Level 0  CIRCULATION SENSATION AND MOVEMENT:    Within Normal Limits   Yes.    COMMENTS:   Rechecked during shift with no change

## 2017-09-06 NOTE — Research (Signed)
CADFEM Informed Consent   Subject Name: Joe [name]  Subject met inclusion and exclusion criteria.  The informed consent form, study requirements and expectations were reviewed with the subject and questions and concerns were addressed prior to the signing of the consent form.  The subject verbalized understanding of the trail requirements.  The subject agreed to participate in the CADFEM trial and signed the informed consent.  The informed consent was obtained prior to performance of any protocol-specific procedures for the subject.  A copy of the signed informed consent was given to the subject and a copy was placed in the subject's medical record.  Christena Flake 09/06/2017, 06:47 AM

## 2017-09-06 NOTE — Progress Notes (Signed)
1045-post ambulating to rest room pt became diaphoretic, short of breath (o2 sat high 90's) increased WOB. Experienced quick recovery in recliner. Dr Irish Lack notified.

## 2017-09-06 NOTE — Progress Notes (Signed)
    Cath Lab Visit (complete for each Cath Lab visit)  Clinical Evaluation Leading to the Procedure:   ACS: No.  Non-ACS:    Anginal Classification: CCS III  Anti-ischemic medical therapy: Minimal Therapy (1 class of medications)  Non-Invasive Test Results: No non-invasive testing performed  Prior CABG: No previous CABG   Patient with sx concerning for angina.  PLan for cath per Dr. Agustin Cree.  Joe Booze, MD

## 2017-09-06 NOTE — Progress Notes (Signed)
Aggrastst off at 1109.

## 2017-09-06 NOTE — Progress Notes (Addendum)
Noted several episodes today when patients heart rate dropped to the 30s. Spoke to patient and wife, patient has been diagnosed with sleep apnea and has a CPAP but the mask is not the right one and patient will not use it. They discussed this with PCP Dr. Ovid Curd and he is working on getting him the proper mask so he can resume using his CPAP machine. Discussed the importance of using the machine and patient and wife verbalized understanding. Notifying provider of above information. [place] Notified Reino Bellis of above, no orders or change in care.

## 2017-09-07 ENCOUNTER — Inpatient Hospital Stay (HOSPITAL_COMMUNITY): Payer: BLUE CROSS/BLUE SHIELD

## 2017-09-07 DIAGNOSIS — I255 Ischemic cardiomyopathy: Secondary | ICD-10-CM | POA: Diagnosis present

## 2017-09-07 DIAGNOSIS — I2510 Atherosclerotic heart disease of native coronary artery without angina pectoris: Secondary | ICD-10-CM

## 2017-09-07 DIAGNOSIS — E118 Type 2 diabetes mellitus with unspecified complications: Secondary | ICD-10-CM

## 2017-09-07 DIAGNOSIS — G4733 Obstructive sleep apnea (adult) (pediatric): Secondary | ICD-10-CM

## 2017-09-07 DIAGNOSIS — Z9861 Coronary angioplasty status: Secondary | ICD-10-CM

## 2017-09-07 DIAGNOSIS — I209 Angina pectoris, unspecified: Secondary | ICD-10-CM

## 2017-09-07 DIAGNOSIS — I5021 Acute systolic (congestive) heart failure: Secondary | ICD-10-CM

## 2017-09-07 DIAGNOSIS — I340 Nonrheumatic mitral (valve) insufficiency: Secondary | ICD-10-CM

## 2017-09-07 DIAGNOSIS — I10 Essential (primary) hypertension: Secondary | ICD-10-CM

## 2017-09-07 HISTORY — DX: Atherosclerotic heart disease of native coronary artery without angina pectoris: I25.10

## 2017-09-07 HISTORY — DX: Ischemic cardiomyopathy: I25.5

## 2017-09-07 HISTORY — DX: Atherosclerotic heart disease of native coronary artery without angina pectoris: Z98.61

## 2017-09-07 LAB — ECHOCARDIOGRAM COMPLETE
Height: 75 in
Weight: 5936.55 oz

## 2017-09-07 LAB — GLUCOSE, CAPILLARY
Glucose-Capillary: 220 mg/dL — ABNORMAL HIGH (ref 65–99)
Glucose-Capillary: 211 mg/dL — ABNORMAL HIGH (ref 65–99)
Glucose-Capillary: 182 mg/dL — ABNORMAL HIGH (ref 65–99)
Glucose-Capillary: 214 mg/dL — ABNORMAL HIGH (ref 65–99)

## 2017-09-07 LAB — BASIC METABOLIC PANEL
Anion gap: 10 (ref 5–15)
BUN: 13 mg/dL (ref 6–20)
CO2: 26 mmol/L (ref 22–32)
Calcium: 8.9 mg/dL (ref 8.9–10.3)
Chloride: 99 mmol/L — ABNORMAL LOW (ref 101–111)
Creatinine, Ser: 1.03 mg/dL (ref 0.61–1.24)
GFR calc Af Amer: >60 mL/min (ref >60)
GFR calc non Af Amer: >60 mL/min (ref >60)
Glucose, Bld: 217 mg/dL — ABNORMAL HIGH (ref 65–99)
Potassium: 4.1 mmol/L (ref 3.5–5.1)
Sodium: 135 mmol/L (ref 135–145)

## 2017-09-07 LAB — CBC
HCT: 43.9 % (ref 39.0–52.0)
Hemoglobin: 15.2 g/dL (ref 13.0–17.0)
MCH: 31.5 pg (ref 26.0–34.0)
MCHC: 34.6 g/dL (ref 30.0–36.0)
MCV: 90.9 fL (ref 78.0–100.0)
Platelets: 224 10*3/uL (ref 150–400)
RBC: 4.83 MIL/uL (ref 4.22–5.81)
RDW: 12.6 % (ref 11.5–15.5)
WBC: 8.6 10*3/uL (ref 4.0–10.5)

## 2017-09-07 MED ORDER — CARVEDILOL 3.125 MG PO TABS
6.2500 mg | ORAL_TABLET | Freq: Two times a day (BID) | ORAL | Status: DC
  Administered 2017-09-07 – 2017-09-08 (×2): 6.25 mg via ORAL
  Filled 2017-09-07 (×2): qty 2

## 2017-09-07 MED ORDER — SPIRONOLACTONE 12.5 MG HALF TABLET
12.5000 mg | ORAL_TABLET | Freq: Every day | ORAL | Status: DC
  Administered 2017-09-07 – 2017-09-08 (×2): 12.5 mg via ORAL
  Filled 2017-09-07 (×3): qty 1

## 2017-09-07 MED ORDER — METFORMIN HCL 500 MG PO TABS: 500.0000 mg | ORAL_TABLET | Freq: Two times a day (BID) | ORAL | Status: DC

## 2017-09-07 MED ORDER — PERFLUTREN LIPID MICROSPHERE
1.0000 mL | INTRAVENOUS | Status: AC | PRN
  Administered 2017-09-07: 2 mL via INTRAVENOUS
  Filled 2017-09-07: qty 10

## 2017-09-07 MED ORDER — LIVING BETTER WITH HEART FAILURE BOOK
Freq: Once | Status: AC
  Administered 2017-09-07: 07:00:00

## 2017-09-07 MED ORDER — IRBESARTAN 75 MG PO TABS
75.0000 mg | ORAL_TABLET | Freq: Every day | ORAL | Status: DC
  Administered 2017-09-07 – 2017-09-08 (×2): 75 mg via ORAL
  Filled 2017-09-07 (×2): qty 1

## 2017-09-07 MED ORDER — LIVING WELL WITH DIABETES BOOK
Freq: Once | Status: AC
  Administered 2017-09-07: 13:00:00
  Filled 2017-09-07 (×2): qty 1

## 2017-09-07 MED ORDER — INSULIN ASPART 100 UNIT/ML ~~LOC~~ SOLN
0.0000 [IU] | Freq: Three times a day (TID) | SUBCUTANEOUS | Status: DC
  Administered 2017-09-08: 5 [IU] via SUBCUTANEOUS

## 2017-09-07 MED ORDER — INSULIN ASPART 100 UNIT/ML ~~LOC~~ SOLN
0.0000 [IU] | Freq: Every day | SUBCUTANEOUS | Status: DC
  Administered 2017-09-07: 22:00:00 2 [IU] via SUBCUTANEOUS

## 2017-09-07 MED FILL — Heparin Sodium (Porcine) 2 Unit/ML in Sodium Chloride 0.9%: INTRAMUSCULAR | Qty: 1000 | Status: AC

## 2017-09-07 NOTE — Progress Notes (Signed)
  Echocardiogram 2D Echocardiogram has been performed.  Jennette Dubin 09/07/2017, 9:30 AM

## 2017-09-07 NOTE — Progress Notes (Signed)
Inpatient Diabetes Program Recommendations  AACE/ADA: New Consensus Statement on Inpatient Glycemic Control (2015)  Target Ranges:  Prepandial:   less than 140 mg/dL      Peak postprandial:   less than 180 mg/dL (1-2 hours)      Critically ill patients:  140 - 180 mg/dL   Spoke with patient about DM. Patient reports being newly diagnosed last week at his PCP office and was told to take Metformin 500 mg BID. Patient reports his A1c was around 9%. Wife at bedside also reports having DM. Spoke with patient and wife about glucose and A1C goals. Discussed importance of checking CBGs and maintaining good CBG control to prevent long-term and short-term complications. Explained how hyperglycemia leads to damage within blood vessels which lead to the common complications seen with uncontrolled diabetes. Stressed to the patient the importance of improving glycemic control to prevent further complications from uncontrolled diabetes. Discussed impact of nutrition, exercise, stress, sickness, and medications on diabetes control. Discussed carbohydrates, carbohydrate goals per day and meal, along with portion sizes. Encouraged them to check glucose at least 2 times a day. Wife had questions about the freestyle Libre continuous glucose monitor. Encouraged patient and wife to seek out an Musician. Patient and wife verbalize understanding of information discussed and have no further questions at this time related to diabetes.  Thanks,  Tama Headings RN, MSN, [facility] Inpatient Diabetes Coordinator Team Pager [phone] (8a-5p)

## 2017-09-07 NOTE — Progress Notes (Signed)
Inpatient Diabetes Program Recommendations  AACE/ADA: New Consensus Statement on Inpatient Glycemic Control (2015)  Target Ranges:  Prepandial:   less than 140 mg/dL      Peak postprandial:   less than 180 mg/dL (1-2 hours)      Critically ill patients:  140 - 180 mg/dL   Results for ZANNIE, RUNKLE (MRN [id number]) as of 09/07/2017 12:28  Ref. Range 09/06/2017 05:53 09/06/2017 09:12 09/06/2017 12:08 09/06/2017 17:03 09/06/2017 20:52 09/07/2017 06:11 09/07/2017 12:14  Glucose-Capillary Latest Ref Range: 65 - 99 mg/dL 217 (H) 218 (H) 263 (H) 251 (H) 189 (H) 220 (H) 211 (H)   Review of Glycemic Control  Diabetes history: DM2 Outpatient Diabetes medications: Metformin 1000 mg BID Current orders for Inpatient glycemic control: Novolog 0-15 units TID with meals, Metformin 500 mg BID (start 09/09/17)  Inpatient Diabetes Program Recommendations:  Insulin - Basal: While inpatient, please consider ordering Lantus 10 units Q24H (starting now). HgbA1C: Please consider ordering an A1C to evaluate glycemic control over the past 2-3 months.  Thanks, Barnie Alderman, RN, MSN, CDE Diabetes Coordinator Inpatient Diabetes Program [phone] (Team Pager from 8am to 5pm)

## 2017-09-07 NOTE — Progress Notes (Addendum)
Progress Note  Patient Name: Joe [name] Date of Encounter: 09/07/2017  Primary Cardiologist: Dr Agustin Cree   Subjective   Just back from echo. No SOB at rest  Inpatient Medications    Scheduled Meds: . aspirin EC  81 mg Oral Daily  . furosemide  40 mg Intravenous BID  . insulin aspart  0-15 Units Subcutaneous TID WC  . metoprolol succinate  25 mg Oral Daily  . pantoprazole  40 mg Oral Daily  . rosuvastatin  40 mg Oral QPM  . sodium chloride flush  3 mL Intravenous Q12H  . ticagrelor  90 mg Oral BID   Continuous Infusions: . sodium chloride     PRN Meds: sodium chloride, acetaminophen, allopurinol, nitroGLYCERIN, ondansetron (ZOFRAN) IV, sodium chloride flush   Vital Signs    Vitals:   09/06/17 2000 09/06/17 2015 09/07/17 0324 09/07/17 0809  BP:  137/88 (!) 152/112 (!) 134/99  Pulse:  (!) 103 (!) 104 98  Resp: (!) 21 16 (!) 22 18  Temp:  97.8 F (36.6 C) 97.6 F (36.4 C) 98 F (36.7 C)  TempSrc:  Oral Oral Oral  SpO2:  94% 96% 94%  Weight:   (!) 371 lb 0.6 oz (168.3 kg)   Height:        Intake/Output Summary (Last 24 hours) at 09/07/2017 0855 Last data filed at 09/07/2017 0700 Gross per 24 hour  Intake 1146 ml  Output 2950 ml  Net -1804 ml   Filed Weights   09/06/17 0541 09/07/17 0324  Weight: (!) 386 lb (175.1 kg) (!) 371 lb 0.6 oz (168.3 kg)    Telemetry    NSR, ST- Personally Reviewed  ECG    09/07/17- NSR-96, LAD RBBB - Personally Reviewed  Physical Exam   GEN: Morbidly obese male, no acute distress.   Neck: No JVD Cardiac: RRR, no murmurs, rubs, or gallops.  Respiratory: Clear to auscultation bilaterally. GI: Soft, nontender, non-distended  MS: No edema; No deformity. Rt radial site without hematoma Neuro:  Nonfocal  Psych: Normal affect   Labs    Chemistry Recent Labs  Lab 09/07/17 0341  NA 135  K 4.1  CL 99*  CO2 26  GLUCOSE 217*  BUN 13  CREATININE 1.03  CALCIUM 8.9  GFRNONAA >60  GFRAA >60  ANIONGAP 10      Hematology Recent Labs  Lab 09/07/17 0341  WBC 8.6  RBC 4.83  HGB 15.2  HCT 43.9  MCV 90.9  MCH 31.5  MCHC 34.6  RDW 12.6  PLT 224    Cardiac EnzymesNo results for input(s): TROPONINI in the last 168 hours. No results for input(s): TROPIPOC in the last 168 hours.   BNPNo results for input(s): BNP, PROBNP in the last 168 hours.   DDimer No results for input(s): DDIMER in the last 168 hours.   Radiology    Dg Chest 2 View  Result Date: 09/06/2017 CLINICAL DATA:  Shortness of breath, right-sided chest pain, headache, and dizziness for the past 2 weeks. History of angina pectoris and CHF. Nonsmoker. EXAM: CHEST - 2 VIEW COMPARISON:  None in PACs FINDINGS: The lungs are well-expanded. The interstitial markings are mildly prominent. The cardiac silhouette is mildly enlarged. The pulmonary vascularity is not clearly engorged. The mediastinum is normal in width. There is no pleural effusion. The bony thorax exhibits no acute abnormality. IMPRESSION: Mild interstitial prominence may reflect acute bronchitic change. One cannot exclude low-grade pulmonary edema in the appropriate clinical setting. There is no alveolar pneumonia.  Electronically Signed   By: David  Martinique M.D.   On: 09/06/2017 09:27    Cardiac Studies   Echo pending-  See cath report 09/06/17-  Patient Profile     47 y.o. male with a history of obesity, OSA, NIDDM, RBBB, and HLD seen as an OP 09/05/17 with c/o dyspnea and chest pain c/w angina. Pt admitted for cath 09/06/17.   Assessment & Plan    Exertional angina- Presenting symptoms 09/05/17  CAD s/p PCI- Cath 09/06/17 showed moderate CFX disease (50%), moderate RCA disease (50% dRCA), and 75% mLAD and 90% dLAD. He underwent PCI with DES to both LAD lesions.  Acute systolic CHF- EF 95-28% at cath, elevated LVEDP at cath. He has diuresed 1.8L so far on Lasix 40 mg IV BID.   Obesity- BMI 48  Sleep apnea- Reportedly not compliant with C-pap  Type 2 NIDDM- On  Glucophage prior to admission  RBBB- Chronic  Plan: Dr Irish Lack felt the pt would need 24-48 hour of diuresis and adjustment of his medications, not ready for discharge today. Resume Glucophage 3/24. Change Toprol to Coreg  (DM and ICM), add ARB -Micardis is available as an OP. Pt will work with his PCP about C-pap mask replacement. He'll need a f/u echo in 2-3 months.  For questions or updates, please contact [place] Please consult [email or web address] for contact info under Cardiology/STEMI.      Signed, Kerin Ransom, PA-C  09/07/2017, 8:55 AM

## 2017-09-07 NOTE — Progress Notes (Signed)
CARDIAC REHAB PHASE I   PRE:  Rate/Rhythm: 107 ST    BP: sitting 149/107    SaO2:   MODE:  Ambulation: 800 ft   POST:  Rate/Rhythm: 119 ST    BP: sitting 141/83     SaO2:   Pt denied CP walking. He did have one instance of SOB after 600 ft with talking. HR 119 ST at the time. BP improved after walk, which made me question previous BPs accuracy. In depth education done with pt and wife. Discussed Brilinta, stent, restrictions, HF management, diet, ex, NTG, and CRPII. Pt with good reception and requests his referral be sent to [facility]. He understands importance of Brilinta. Encouraged him to read information today and ask questions tomorrow. To walk independently.  [phone]   [place], ACSM 09/07/2017 10:20 AM

## 2017-09-07 NOTE — Plan of Care (Signed)
  RD consulted for nutrition education regarding diabetes.   No results found for: HGBA1C   Spoke with pt at bedside (wife was sleeping soundly in recliner). He reports he was diagnosed with DM PTA at PCP's office. He was given a prescription for Metformin and a pamphlet for DM resources and support groups. Pt reports that he eats 3 meals per day and has cut back on fast food. Meals are usually eaten out and pt consumes foods such as [place], Hibachi, or pizza. Commonly consumed beverages are water, diet soda, and sweet and unsweetened tea, occasionally coffee with cream and sugar. 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  DM coordinator met with pt earlier; pt was able to teach back to this RD basics of DM self-management. RD also reinforced portion control and ways to make healthier choices at restaurants. Additionally, educated pt on low calorie beverage options.  Pt very receptive to recommendations, but verbalizes being overwhelmed with new information. Both he and his wife are receptive to attending outpatient diabetes self-management education classes. RD will place referral.  Case and plan discussed with RN.  RD provided "Heart Healthy, Consistent Carbohydrate" handout from the Academy of Nutrition and Dietetics. Reviewed patient's dietary recall. Provided examples on ways to decrease sodium and fat intake in diet. Discouraged intake of processed foods and use of salt shaker. Encouraged fresh fruits and vegetables as well as whole grain sources of carbohydrates to maximize fiber intake.  Discussed different food groups and their effects on blood sugar, emphasizing carbohydrate-containing foods. Provided list of carbohydrates and  recommended serving sizes of common foods.  Discussed importance of controlled and consistent carbohydrate intake throughout the day. Provided examples of ways to balance meals/snacks and encouraged intake of high-fiber, whole grain complex carbohydrates. Teach back method used.  Expect fair to good compliance.  Body mass index is 46.38 kg/m. Pt meets criteria for extreme obesity, class III based on current BMI.  Current diet order is Carb Modified, patient is consuming approximately 100% of meals at this time. Labs and medications reviewed. No further nutrition interventions warranted at this time. RD contact information provided. If additional nutrition issues arise, please re-consult RD.  Joe [name] A. Jimmye Norman, RD, LDN, CDE Pager: [phone] After hours Pager: [phone]

## 2017-09-08 DIAGNOSIS — I429 Cardiomyopathy, unspecified: Secondary | ICD-10-CM

## 2017-09-08 LAB — BASIC METABOLIC PANEL
Anion gap: 13 (ref 5–15)
BUN: 16 mg/dL (ref 6–20)
CO2: 25 mmol/L (ref 22–32)
Calcium: 8.6 mg/dL — ABNORMAL LOW (ref 8.9–10.3)
Chloride: 99 mmol/L — ABNORMAL LOW (ref 101–111)
Creatinine, Ser: 1.1 mg/dL (ref 0.61–1.24)
GFR calc Af Amer: >60 mL/min (ref >60)
GFR calc non Af Amer: >60 mL/min (ref >60)
Glucose, Bld: 203 mg/dL — ABNORMAL HIGH (ref 65–99)
Potassium: 4 mmol/L (ref 3.5–5.1)
Sodium: 137 mmol/L (ref 135–145)

## 2017-09-08 LAB — GLUCOSE, CAPILLARY: Glucose-Capillary: 220 mg/dL — ABNORMAL HIGH (ref 65–99)

## 2017-09-08 MED ORDER — SPIRONOLACTONE 25 MG PO TABS: 12.5000 mg | ORAL_TABLET | Freq: Every day | ORAL | 6 refills | Status: DC

## 2017-09-08 MED ORDER — FUROSEMIDE 40 MG PO TABS: 40.0000 mg | ORAL_TABLET | Freq: Every day | ORAL | 11 refills | Status: DC

## 2017-09-08 MED ORDER — TICAGRELOR 90 MG PO TABS: 90.0000 mg | ORAL_TABLET | Freq: Two times a day (BID) | ORAL | 11 refills | Status: DC

## 2017-09-08 MED ORDER — CARVEDILOL 6.25 MG PO TABS: 6.2500 mg | ORAL_TABLET | Freq: Two times a day (BID) | ORAL | 6 refills | Status: DC

## 2017-09-08 MED ORDER — IRBESARTAN 75 MG PO TABS: 75.0000 mg | ORAL_TABLET | Freq: Every day | ORAL | 6 refills | Status: DC

## 2017-09-08 NOTE — Discharge Summary (Signed)
Discharge Summary    Patient ID: Joe [name],  MRN: [id number], DOB/AGE: [birth year] 47 y.o.  Admit date: 09/06/2017 Discharge date: 09/08/2017  Primary Care Provider: Cyndi Bender Primary Cardiologist: Dr. Agustin Cree  Discharge Diagnoses    Principal Problem:   Angina pectoris [place]) Active Problems:   Obstructive sleep apnea   GERD (gastroesophageal reflux disease)   Essential hypertension, benign   Obesity   Right bundle branch block   Type 2 diabetes mellitus with complication, without long-term current use of insulin (HCC)   Acute systolic heart failure (HCC)   CAD S/P percutaneous coronary angioplasty   Ischemic cardiomyopathy   Allergies No Known Allergies  Diagnostic Studies/Procedures    Echo 09/07/17 Study Conclusions  - Left ventricle: Not well visualized, Definity utilized to help estimate EF. The cavity size was normal. Wall thickness was increased in a pattern of mild LVH. Systolic function was moderately reduced. The estimated ejection fraction was in the range of 35% to 40%. Diffuse hypokinesis. - Mitral valve: There was mild regurgitation. - Left atrium: The atrium was mildly dilated.  CORONARY STENT INTERVENTION 09/06/17  LEFT HEART CATH AND CORONARY ANGIOGRAPHY  Conclusion     Mid RCA lesion is 25% stenosed.  Ost RPDA to RPDA lesion is 50% stenosed.  Dist RCA lesion is 25% stenosed with 25% stenosed side branch in Post Atrio.  Prox Cx to Mid Cx lesion is 50% stenosed.  Dist LAD lesion is 40% stenosed.  Mid LAD-2 lesion is 90% stenosed.  A drug-eluting stent was successfully placed using a STENT SYNERGY DES 2.25X28.  Post intervention, there is a 0% residual stenosis.  More proximal Mid LAD-1 lesion is 75% stenosed.  A drug-eluting stent was successfully placed using a STENT SYNERGY DES 2.25X32.  A drug-eluting stent was successfully placed using a STENT SYNERGY DES 2.5X12, overlapping proximal edge of the above  stent.  Post intervention, there is a 0% residual stenosis.  There is moderate to severe left ventricular systolic dysfunction.  The left ventricular ejection fraction is 25-35% by visual estimate, although V-gram was suboptimal due to LV dilatation.  LV end diastolic pressure is moderately elevated.  Severe LAD disease treated with stents above. Residual diffuse distal/apical LAD disease. Acute systolic heart failure. He will need echo and diuresis. I expect he will be here for a few days in the hospital. He will need to be started on CHF meds as well. HR stayed elevated despite IV beta blocker. Unclear whether there could be some tachycardia component contributing to decreased LVEF.   Continue DAPT for 6-12 months. Higher likelihood of clopidogrel resistance given his size, so Brilinta was used.     History of Present Illness     47 y.o.malewith a history of obesity, OSA, NIDDM, RBBB, and HLD seen as an OP 09/05/17 with c/o dyspnea and chest pain c/w angina. Pt admitted for cath 09/06/17.  For the past 2 weeks, he experienced exertional chest tightness.  This is associated with significant shortness of breath.  When he goes outside from one building to the other building, he will get tightness in the chest to the point that he needed to slow down and also significant shortness of breath.  He kind of down complained of symptoms he said he had it all the time however he admits that definitely within last 2 weeks this situation became significantly worse.   Have history of significant sleep apnea however have difficulty with the equipment and basically does not use any  CPAP mask now.  He is also morbidly obese  Hospital Course     Consultants: None  1. Exertional angina/CAD s/p PCI - Cath 09/06/17 showed moderate CFX disease (50%), moderate RCA disease (50% dRCA), and 75% mLAD and 90% dLAD. He underwent PCI with DES to both LAD lesions. No recurrent chest pain. Ambulated well.    - Continue ASA, Brillinta and Crestor and BB.   2. Chronic systolic CHF/ICM - LVEF of 25-35% by cath. He diuresed 6.4L since admit. Weight down 22 lb (386-->364lb).  - He is euvolemic. Continue coreg, ivapro and spironolactone. Will discharge on po lasix 40mg  QD. Heart failure education given.  BMET during follow up.   3. Sinus arrhythmia - He does have some evidence of conduction system disease with IVCD and prolonged PR interval, pause (longest 3.38 sec) noted on telemetry during the early AM which was asymptomatic, although also in the setting of OSA. No further up titration of Coreg at this time.   4. Sleep apnea - He is working with PCP to get correct mask. Encouraged compliance and weight loss.   5. HLD - No results found for requested labs within last 8760 hours. managed by PCP. LDL goal less than 70. Continue statin.  6. DM - resume home meds  7. HTN - BP stable   The patient has been seen by Dr. Domenic Polite  today and deemed ready for discharge home. All follow-up appointments have been scheduled. Discharge medications are listed below.  _____________   Discharge Vitals Blood pressure 117/78, pulse 98, temperature 97.8 F (36.6 C), temperature source Oral, resp. rate 13, height 6\' 3"  (1.905 m), weight (!) 364 lb 3.2 oz (165.2 kg), SpO2 100 %.  Filed Weights   09/06/17 0541 09/07/17 0324 09/08/17 0318  Weight: (!) 386 lb (175.1 kg) (!) 371 lb 0.6 oz (168.3 kg) (!) 364 lb 3.2 oz (165.2 kg)    Labs & Radiologic Studies     CBC Recent Labs    09/07/17 0341  WBC 8.6  HGB 15.2  HCT 43.9  MCV 90.9  PLT 188   Basic Metabolic Panel Recent Labs    09/07/17 0341 09/08/17 0528  NA 135 137  K 4.1 4.0  CL 99* 99*  CO2 26 25  GLUCOSE 217* 203*  BUN 13 16  CREATININE 1.03 1.10  CALCIUM 8.9 8.6*    Dg Chest 2 View  Result Date: 09/06/2017 CLINICAL DATA:  Shortness of breath, right-sided chest pain, headache, and dizziness for the past 2 weeks. History of  angina pectoris and CHF. Nonsmoker. EXAM: CHEST - 2 VIEW COMPARISON:  None in PACs FINDINGS: The lungs are well-expanded. The interstitial markings are mildly prominent. The cardiac silhouette is mildly enlarged. The pulmonary vascularity is not clearly engorged. The mediastinum is normal in width. There is no pleural effusion. The bony thorax exhibits no acute abnormality. IMPRESSION: Mild interstitial prominence may reflect acute bronchitic change. One cannot exclude low-grade pulmonary edema in the appropriate clinical setting. There is no alveolar pneumonia. Electronically Signed   By: David  Martinique M.D.   On: 09/06/2017 09:27    Disposition   Pt is being discharged home today in good condition.  Follow-up Plans & Appointments    Follow-up Information    [facility] Follow up.   Specialty:  Cardiology Why:  office will call with time and date of appointment with Dr. Agustin Cree. Please call us if you did not heard by Tuesday noon  Contact information: [phone], [place]         Discharge Instructions    Amb Referral to Cardiac Rehabilitation   Complete by:  As directed    Diagnosis:   PTCA Coronary Stents     Amb Referral to Nutrition and Diabetic E   Complete by:  As directed    Diet - low sodium heart healthy   Complete by:  As directed    Discharge instructions   Complete by:  As directed    No driving for 48 hours. No lifting over 5 lbs for 1 week. No sexual activity for 1 week. You may return to work on 09/17/17. Keep procedure site clean & dry. If you notice increased pain, swelling, bleeding or pus, call/return!  You may shower, but no soaking baths/hot tubs/pools for 1 week.   Hold metformin today. Resume tomorrow 09/09/17.  *Weigh yourself on the same scale at same time of day and keep a log. *Report weight gain of > 3 lbs in 1 day or 5 lbs over the course of a week and/or symptoms of excess  fluid (shortness of breath, difficulty lying flat, swelling, poor appetite, abdominal fullness/bloating, etc) to your doctor immediately. *Avoid foods that are high in sodium (processed, pre-packaged/canned goods, fast foods, etc). *Please attend all scheduled and reccommended follow up appointments   Increase activity slowly   Complete by:  As directed       Discharge Medications   Allergies as of 09/08/2017   No Known Allergies     Medication List    STOP taking these medications   indomethacin 50 MG capsule Commonly known as:  INDOCIN   meloxicam 15 MG tablet Commonly known as:  MOBIC   metoprolol succinate 25 MG 24 hr tablet Commonly known as:  TOPROL-XL     TAKE these medications   allopurinol 100 MG tablet Commonly known as:  ZYLOPRIM Take 100 mg by mouth daily as needed (gout).   aspirin EC 81 MG tablet Take 1 tablet (81 mg total) by mouth daily.   carvedilol 6.25 MG tablet Commonly known as:  COREG Take 1 tablet (6.25 mg total) by mouth 2 (two) times daily with a meal.   furosemide 40 MG tablet Commonly known as:  LASIX Take 1 tablet (40 mg total) by mouth daily.   irbesartan 75 MG tablet Commonly known as:  AVAPRO Take 1 tablet (75 mg total) by mouth daily.   metFORMIN 500 MG 24 hr tablet Commonly known as:  GLUCOPHAGE-XR Take 1,000 mg by mouth 2 (two) times daily.   nitroGLYCERIN 0.4 MG SL tablet Commonly known as:  NITROSTAT Place 1 tablet (0.4 mg total) under the tongue every 5 (five) minutes as needed for chest pain.   omeprazole 40 MG capsule Commonly known as:  PRILOSEC Take 40 mg by mouth daily.   rosuvastatin 40 MG tablet Commonly known as:  CRESTOR Take 1 tablet (40 mg total) by mouth every evening.   spironolactone 25 MG tablet Commonly known as:  ALDACTONE Take 0.5 tablets (12.5 mg total) by mouth daily.   TESTOSTERONE IM Inject 200 mg into the muscle every 14 (fourteen) days.   ticagrelor 90 MG Tabs tablet Commonly known as:   BRILINTA Take 1 tablet (90 mg total) by mouth 2 (two) times daily.          Outstanding Labs/Studies   BMET  Duration of Discharge Encounter   Greater than 30 minutes including physician  time.  Jarrett Soho PA-C 09/08/2017, 8:02 AM

## 2017-09-08 NOTE — Progress Notes (Addendum)
Progress Note  Patient Name: Joe [name] Date of Encounter: 09/08/2017  Primary Cardiologist: Joe Campus, MD   Subjective   Feeling well. No chest pain, sob or palpitations.   Inpatient Medications    Scheduled Meds: . aspirin EC  81 mg Oral Daily  . carvedilol  6.25 mg Oral BID WC  . furosemide  40 mg Intravenous BID  . insulin aspart  0-15 Units Subcutaneous TID WC  . insulin aspart  0-5 Units Subcutaneous QHS  . irbesartan  75 mg Oral Daily  . [START ON 09/09/2017] metFORMIN  500 mg Oral BID WC  . pantoprazole  40 mg Oral Daily  . rosuvastatin  40 mg Oral QPM  . sodium chloride flush  3 mL Intravenous Q12H  . spironolactone  12.5 mg Oral Daily  . ticagrelor  90 mg Oral BID   Continuous Infusions: . sodium chloride     PRN Meds: sodium chloride, acetaminophen, allopurinol, nitroGLYCERIN, ondansetron (ZOFRAN) IV, sodium chloride flush   Vital Signs    Vitals:   09/07/17 1214 09/07/17 2035 09/08/17 0318 09/08/17 0707  BP: (!) 147/87 109/70 122/66 115/81  Pulse: (!) 104 (!) 104 87 95  Resp: (!) 23 (!) 27 19   Temp: 98.5 F (36.9 C) 98.1 F (36.7 C) 97.8 F (36.6 C)   TempSrc: Oral Oral Oral   SpO2: 97% 95% 100%   Weight:   (!) 364 lb 3.2 oz (165.2 kg)   Height:        Intake/Output Summary (Last 24 hours) at 09/08/2017 0729 Last data filed at 09/08/2017 2263 Gross per 24 hour  Intake 240 ml  Output 4150 ml  Net -3910 ml   Filed Weights   09/06/17 0541 09/07/17 0324 09/08/17 0318  Weight: (!) 386 lb (175.1 kg) (!) 371 lb 0.6 oz (168.3 kg) (!) 364 lb 3.2 oz (165.2 kg)    Telemetry    Sinus tachycardia at rate of 100s, intermittent pause, longest 3.38 sec  - Personally Reviewed  ECG    n/a  Physical Exam   GEN: obese male in acute distress.   Neck: No JVD Cardiac: RRR, no murmurs, rubs, or gallops.  Respiratory: Clear to auscultation bilaterally. GI: Soft, nontender, non-distended  MS: Trace edema; No deformity. Neuro:  Nonfocal    Psych: Normal affect   Labs    Chemistry Recent Labs  Lab 09/07/17 0341 09/08/17 0528  NA 135 137  K 4.1 4.0  CL 99* 99*  CO2 26 25  GLUCOSE 217* 203*  BUN 13 16  CREATININE 1.03 1.10  CALCIUM 8.9 8.6*  GFRNONAA >60 >60  GFRAA >60 >60  ANIONGAP 10 13     Hematology Recent Labs  Lab 09/07/17 0341  WBC 8.6  RBC 4.83  HGB 15.2  HCT 43.9  MCV 90.9  MCH 31.5  MCHC 34.6  RDW 12.6  PLT 224   Radiology    No results found.  Cardiac Studies   Echo 09/07/17 Study Conclusions  - Left ventricle: Not well visualized, Definity utilized to help   estimate EF. The cavity size was normal. Wall thickness was   increased in a pattern of mild LVH. Systolic function was   moderately reduced. The estimated ejection fraction was in the   range of 35% to 40%. Diffuse hypokinesis. - Mitral valve: There was mild regurgitation. - Left atrium: The atrium was mildly dilated.  CORONARY STENT INTERVENTION 09/06/17  LEFT HEART CATH AND CORONARY ANGIOGRAPHY  Conclusion  Mid RCA lesion is 25% stenosed.  Ost RPDA to RPDA lesion is 50% stenosed.  Dist RCA lesion is 25% stenosed with 25% stenosed side branch in Post Atrio.  Prox Cx to Mid Cx lesion is 50% stenosed.  Dist LAD lesion is 40% stenosed.  Mid LAD-2 lesion is 90% stenosed.  A drug-eluting stent was successfully placed using a STENT SYNERGY DES 2.25X28.  Post intervention, there is a 0% residual stenosis.  More proximal Mid LAD-1 lesion is 75% stenosed.  A drug-eluting stent was successfully placed using a STENT SYNERGY DES 2.25X32.  A drug-eluting stent was successfully placed using a STENT SYNERGY DES 2.5X12, overlapping proximal edge of the above stent.  Post intervention, there is a 0% residual stenosis.  There is moderate to severe left ventricular systolic dysfunction.  The left ventricular ejection fraction is 25-35% by visual estimate, although V-gram was suboptimal due to LV dilatation.  LV end  diastolic pressure is moderately elevated.   Severe LAD disease treated with stents above.  Residual diffuse distal/apical LAD disease.  Acute systolic heart failure.  He will need echo and diuresis.  I expect he will be here for a few days in the hospital.  He will need to be started on CHF meds as well.  HR stayed elevated despite IV beta blocker.  Unclear whether there could be some tachycardia component contributing to decreased LVEF.     Continue DAPT for 6-12 months.  Higher likelihood of clopidogrel resistance given his size, so Brilinta was used.       Patient Profile     47 y.o. male with a history of obesity, OSA, NIDDM, RBBB, and HLD seen as an OP 09/05/17 with c/o dyspnea and chest pain c/w angina. Pt admitted for cath 09/06/17.   Assessment & Plan    1. Exertional angina/CAD s/p PCI - Cath 09/06/17 showed moderate CFX disease (50%), moderate RCA disease (50% dRCA), and 75% mLAD and 90% dLAD. He underwent PCI with DES to both LAD lesions. No recurrent chest pain.  - Continue ASA, Brillinta and Crestor and BB.   2. Chronic systolic CHF/ICM - LVEF of 25-35% by cath. He diuresed 6.4L since admit. Weight down 22 lb (386-->364lb).  - He is very close to euvolemic. Continue coreg, ivapro and spironolactone. MD to decide po lasix dose. He is getting IV 40mg  BID here.  - His rate in 100s may be contributing to cardiomyopathy.   3. Sleep apnea - he is working with PCP to get correct mask.   4. Obesity - encouraged weight loss.  5. DM - resume home meds   For questions or updates, please contact Joe [name] Please consult [email or web address] for contact info under Cardiology/STEMI.      Joe Longest [place], PA  09/08/2017, 7:29 AM     Attending note:  Patient seen and examined. Case reviewed with Mr. Joe [name].  Mr. Joe [name] reports no chest pain shortness of breath.  Status post DES x2 to the LAD on March 21.  LVEF 35-40% by follow-up echocardiogram.  Medical therapy  has been adjusted during stay including the addition of Coreg.  He does have some evidence of conduction system disease with IVCD and prolonged PR interval, pause noted on telemetry during the early AM which was asymptomatic, although also in the setting of OSA.  On examination he appears comfortable this morning.  Heart rate 80s-90s in sinus rhythm by telemetry which I personally reviewed.  Blood pressure 117/78.  He is morbidly obese.  Lungs clear without labored breathing.  Cardiac exam with indistinct PMI, no gallop.  Lab work shows potassium 4.0, BUN 16, creatinine 1.1.  Echocardiogram shows LVEF 35-40% with diffuse hypokinesis.  Plan is for discharge home today, no further up titration of Coreg at this time.  Continue aspirin and Brilinta.  Change Lasix to 40 mg once daily for now and continue Aldactone.  He will need a follow-up BMET, office visit in about 7 days.  He will be out of work until office reassessment.  Satira Sark, M.D., F.A.C.C.

## 2017-09-09 ENCOUNTER — Other Ambulatory Visit: Payer: Self-pay | Admitting: Medical

## 2017-09-09 ENCOUNTER — Telehealth: Payer: Self-pay | Admitting: Medical

## 2017-09-09 DIAGNOSIS — I5022 Chronic systolic (congestive) heart failure: Secondary | ICD-10-CM

## 2017-09-09 MED ORDER — LISINOPRIL 10 MG PO TABS: 10.0000 mg | ORAL_TABLET | Freq: Every day | ORAL | 11 refills | Status: DC

## 2017-09-09 NOTE — Telephone Encounter (Signed)
Received call from wife stating they were unable to pick up irbesartan due to backorder. Prescription for lisinopril 10mg  daily as an alternative for HTN and systolic CHF was sent to [facility].

## 2017-09-10 ENCOUNTER — Telehealth (HOSPITAL_COMMUNITY): Payer: Self-pay

## 2017-09-10 ENCOUNTER — Telehealth: Payer: Self-pay | Admitting: *Deleted

## 2017-09-10 MED ORDER — IRBESARTAN 150 MG PO TABS: 75.0000 mg | ORAL_TABLET | Freq: Every day | ORAL | 0 refills | Status: DC

## 2017-09-10 NOTE — Telephone Encounter (Signed)
Patients insurance is active and benefits verified through [place] - $30.00 co-pay, no deductible, out of pocket amount of $6,000/$185.20 has been met, no co-insurance, and no pre-authorization is required. Passport/reference [id number]  Patient will be contacted and scheduled upon review by the RN Navigator.

## 2017-09-10 NOTE — Telephone Encounter (Signed)
Patient's wife, Wenda Low, called asking to resend prescription for avapro. Refill sent. Patient has follow up appointment on Friday, 09/14/2017.

## 2017-09-13 ENCOUNTER — Telehealth (HOSPITAL_COMMUNITY): Payer: Self-pay

## 2017-09-13 NOTE — Telephone Encounter (Signed)
Called and spoke with patient in regards to Cardiac Rehab - Patient wanted more information in regards to the program. Explained program to patient. He would like to follow up with his Cardiologist tomorrow then make a decision. Will follow up after appt has been completed.

## 2017-09-14 ENCOUNTER — Encounter: Payer: Self-pay | Admitting: *Deleted

## 2017-09-14 ENCOUNTER — Encounter: Payer: Self-pay | Admitting: Cardiology

## 2017-09-14 ENCOUNTER — Ambulatory Visit: Payer: BLUE CROSS/BLUE SHIELD | Admitting: Cardiology

## 2017-09-14 VITALS — BP 120/60 | HR 102 | Ht 75.0 in | Wt 368.4 lb

## 2017-09-14 DIAGNOSIS — E118 Type 2 diabetes mellitus with unspecified complications: Secondary | ICD-10-CM | POA: Diagnosis not present

## 2017-09-14 DIAGNOSIS — Z9861 Coronary angioplasty status: Secondary | ICD-10-CM | POA: Diagnosis not present

## 2017-09-14 DIAGNOSIS — I255 Ischemic cardiomyopathy: Secondary | ICD-10-CM

## 2017-09-14 DIAGNOSIS — I2510 Atherosclerotic heart disease of native coronary artery without angina pectoris: Secondary | ICD-10-CM

## 2017-09-14 DIAGNOSIS — G4733 Obstructive sleep apnea (adult) (pediatric): Secondary | ICD-10-CM | POA: Diagnosis not present

## 2017-09-14 MED ORDER — SACUBITRIL-VALSARTAN 49-51 MG PO TABS: 1.0000 | ORAL_TABLET | Freq: Two times a day (BID) | ORAL | 3 refills | Status: DC

## 2017-09-14 NOTE — Progress Notes (Signed)
Cardiology Office Note:    Date:  09/14/2017   ID:  Joe [name], DOB 1970/09/08, MRN [id number]  PCP:  Cyndi Bender, PA-C  Cardiologist:  Jenne Campus, MD    Referring MD: Cyndi Bender, PA-C   Chief Complaint  Patient presents with  . Follow up Cath  I have cardiac catheterization feel much better  History of Present Illness:    Joe [name] is a 47 y.o. male with coronary artery disease, cardiomyopathy which is most likely ischemic in origin, hypertension, morbid obesity, obstructive sleep apnea, diabetes.  Met him first on a few weeks ago because of his symptoms he was sent for cardiac catheterization following day cardiac catheterization revealed significant lesion in mid LAD which was addressed by stenting with drug-eluting stent.  He also spent a few days in the hospital but he required diuresis.  He is feeling dramatically better no shortness of breath no chest pain tightness squeezing pressure burning chest.  Spent the entire visit talking about what was done to him and explained to him what was the problem we discussed the issue of cardiomyopathy.  We were talking about the prevention.  We talked about fluid restriction good diet in the very conscious about that already.  His medications are appropriate which I will continue.  I stressed the importance of taking aspirin and Brilinta on the daily basis.  On top of that we did stress importance of taking Crestor on the regular basis.  In terms of his cardiomyopathy management.  I will give him samples of Entresto 49/51 today.  Check his Chem-7 if Chem-7 is normal will initiate Entresto twice daily and Avapro will be discontinued at the time.  He is already on beta-blocker which I will continue he is also on Aldactone which is very appropriate.  Again I will check his Chem-7 today to look at his kidney function and potassium.  Overall he seems to be doing well and I hope that his prognosis will be +1.  Within next few months we will  repeat echocardiogram to recheck his left ventricular ejection fraction.  Past Medical History:  Diagnosis Date  . Abnormal EKG   . Body mass index 45.0-49.9, adult ([place])   . Complication of anesthesia 1995   "had hard time waking up; breathing w/vasectomy"  . Coronary artery disease   . Erectile dysfunction   . Essential hypertension, benign   . Fatigue   . GERD (gastroesophageal reflux disease)   . High cholesterol    "just started tx yesterday" (09/06/2017)  . History of gout    "RX prn" (09/06/2017)  . Muscular chest pain   . Obesity   . OSA on CPAP   . Plantar fasciitis   . Right bundle branch block   . Testosterone deficiency   . Type II diabetes mellitus ([place])    "started tx 08/28/2017"    Past Surgical History:  Procedure Laterality Date  . BACK SURGERY    . CORONARY ANGIOPLASTY WITH STENT PLACEMENT  09/06/2017   "3 stents"  . CORONARY STENT INTERVENTION N/A 09/06/2017   Procedure: CORONARY STENT INTERVENTION;  Surgeon: Jettie Booze, MD;  Location: [facility];  Service: Cardiovascular;  Laterality: N/A;  . KNEE ARTHROSCOPY Right   . LEFT HEART CATH AND CORONARY ANGIOGRAPHY N/A 09/06/2017   Procedure: LEFT HEART CATH AND CORONARY ANGIOGRAPHY;  Surgeon: Jettie Booze, MD;  Location: [facility];  Service: Cardiovascular;  Laterality: N/A;  . LUMBAR SPINE SURGERY  ~  2001   Intradiscal Electrothermoplasty  . VASECTOMY  1995    Current Medications: Current Meds  Medication Sig  . allopurinol (ZYLOPRIM) 100 MG tablet Take 100 mg by mouth daily as needed (gout).   Marland Kitchen aspirin EC 81 MG tablet Take 1 tablet (81 mg total) by mouth daily.  . carvedilol (COREG) 6.25 MG tablet Take 1 tablet (6.25 mg total) by mouth 2 (two) times daily with a meal.  . furosemide (LASIX) 40 MG tablet Take 1 tablet (40 mg total) by mouth daily.  . irbesartan (AVAPRO) 150 MG tablet Take 0.5 tablets (75 mg total) by mouth daily.  . metFORMIN (GLUCOPHAGE-XR) 500 MG 24 hr  tablet Take 500 mg by mouth 2 (two) times daily.   . nitroGLYCERIN (NITROSTAT) 0.4 MG SL tablet Place 1 tablet (0.4 mg total) under the tongue every 5 (five) minutes as needed for chest pain.  Marland Kitchen omeprazole (PRILOSEC) 40 MG capsule Take 40 mg by mouth daily.  . rosuvastatin (CRESTOR) 40 MG tablet Take 1 tablet (40 mg total) by mouth every evening.  Marland Kitchen spironolactone (ALDACTONE) 25 MG tablet Take 0.5 tablets (12.5 mg total) by mouth daily.  . TESTOSTERONE IM Inject 200 mg into the muscle every 14 (fourteen) days.  . ticagrelor (BRILINTA) 90 MG TABS tablet Take 1 tablet (90 mg total) by mouth 2 (two) times daily.     Allergies:   Patient has no known allergies.   Social History   Socioeconomic History  . Marital status: Married    Spouse name: Not on file  . Number of children: Not on file  . Years of education: Not on file  . Highest education level: Not on file  Occupational History  . Not on file  Social Needs  . Financial resource strain: Not on file  . Food insecurity:    Worry: Not on file    Inability: Not on file  . Transportation needs:    Medical: Not on file    Non-medical: Not on file  Tobacco Use  . Smoking status: Never Smoker  . Smokeless tobacco: Never Used  Substance and Sexual Activity  . Alcohol use: No    Frequency: Never  . Drug use: Never  . Sexual activity: Not Currently  Lifestyle  . Physical activity:    Days per week: Not on file    Minutes per session: Not on file  . Stress: Not on file  Relationships  . Social connections:    Talks on phone: Not on file    Gets together: Not on file    Attends religious service: Not on file    Active member of club or organization: Not on file    Attends meetings of clubs or organizations: Not on file    Relationship status: Not on file  Other Topics Concern  . Not on file  Social History Narrative  . Not on file     Family History: The patient's family history includes Brain cancer in his mother;  Diabetes Mellitus II in his brother and paternal grandfather; Lung cancer in his mother; Lymphoma in his father. ROS:   Please see the history of present illness.    All 14 point review of systems negative except as described per history of present illness  EKGs/Labs/Other Studies Reviewed:      Recent Labs: 09/07/2017: Hemoglobin 15.2; Platelets 224 09/08/2017: BUN 16; Creatinine, Ser 1.10; Potassium 4.0; Sodium 137  Recent Lipid Panel No results found for: CHOL, TRIG, HDL, CHOLHDL, VLDL,  LDLCALC, LDLDIRECT  Physical Exam:    VS:  BP 120/60   Pulse (!) 102   Ht _0  (1.905 m)   Wt (!) 368 lb 6.4 oz (167.1 kg)   SpO2 97%   BMI 46.05 kg/m     Wt Readings from Last 3 Encounters:  09/14/17 (!) 368 lb 6.4 oz (167.1 kg)  09/08/17 (!) 364 lb 3.2 oz (165.2 kg)  09/05/17 (!) 386 lb 12.8 oz (175.5 kg)     GEN:  Well nourished, well developed in no acute distress HEENT: Normal NECK: No JVD; No carotid bruits LYMPHATICS: No lymphadenopathy CARDIAC: RRR, no murmurs, no rubs, no gallops RESPIRATORY:  Clear to auscultation without rales, wheezing or rhonchi  ABDOMEN: Soft, non-tender, non-distended MUSCULOSKELETAL:  No edema; No deformity  SKIN: Warm and dry LOWER EXTREMITIES: no swelling NEUROLOGIC:  Alert and oriented x 3 PSYCHIATRIC:  Normal affect   ASSESSMENT:    1. CAD S/P percutaneous coronary angioplasty   2. Obstructive sleep apnea   3. Type 2 diabetes mellitus with complication, without long-term current use of insulin ([place])   4. Ischemic cardiomyopathy    PLAN:    In order of problems listed above:  1. Coronary artery disease: Plan as outlined above.  Stable right now. 2. Type 2 diabetes: Followed by internal medicine team. 3. Obstructive sleep apnea: Scheduled to see pulmonary specialist for adjustment. 4. Cardiomyopathy with ejection fraction 25-50%.  On appropriate medications however we will switch him from ARB to [facility].  Directions given in terms of diet  avoiding salt daily weight that he already does.  We will continue still with diuretic at the present dose.  Him back in 1 month   Medication Adjustments/Labs and Tests Ordered: Current medicines are reviewed at length with the patient today.  Concerns regarding medicines are outlined above.  No orders of the defined types were placed in this encounter.  Medication changes: No orders of the defined types were placed in this encounter.   Signed, Park Liter, MD, [facility] 09/14/2017 9:53 AM    [place]

## 2017-09-14 NOTE — Patient Instructions (Addendum)
Medication Instructions:  Your physician has recommended you make the following change in your medication:  STOP irbesartan (avapro) START entresto 49-51 mg twice daily after you receive a phone call from our office about lab results. Do not start this medication until then. Samples will be provided to you.   Labwork: Your physician recommends that you return for lab work today: BMP.  Testing/Procedures: None  Follow-Up: Your physician recommends that you schedule a follow-up appointment in: 1 month.  Any Other Special Instructions Will Be Listed Below (If Applicable).     If you need a refill on your cardiac medications before your next appointment, please call your pharmacy.

## 2017-09-14 NOTE — Addendum Note (Signed)
Addended by: Austin Miles on: 09/14/2017 10:15 AM   Modules accepted: Orders

## 2017-09-15 LAB — BASIC METABOLIC PANEL
BUN/Creatinine Ratio: 13 (ref 9–20)
BUN: 15 mg/dL (ref 6–24)
CO2: 25 mmol/L (ref 20–29)
Calcium: 9.1 mg/dL (ref 8.7–10.2)
Chloride: 96 mmol/L (ref 96–106)
Creatinine, Ser: 1.12 mg/dL (ref 0.76–1.27)
GFR calc Af Amer: 91 mL/min/{1.73_m2} (ref >59)
GFR calc non Af Amer: 78 mL/min/{1.73_m2} (ref >59)
Glucose: 238 mg/dL — ABNORMAL HIGH (ref 65–99)
Potassium: 4.6 mmol/L (ref 3.5–5.2)
Sodium: 137 mmol/L (ref 134–144)

## 2017-09-18 ENCOUNTER — Telehealth (HOSPITAL_COMMUNITY): Payer: Self-pay

## 2017-09-18 NOTE — Telephone Encounter (Signed)
Attempted to call patient in regards to Cardiac Rehab - lm on vm °

## 2017-09-26 ENCOUNTER — Telehealth (HOSPITAL_COMMUNITY): Payer: Self-pay

## 2017-09-26 NOTE — Telephone Encounter (Signed)
Called and spoke with patient in regards to Cardiac Rehab - Scheduled orientation on 11/27/17 at 8:00am. Patient will attend the 2:45pm exc class. Went over insurance with patient and patient verbally stated he understands what he is responsible for. Mailed packet.

## 2017-09-29 ENCOUNTER — Other Ambulatory Visit: Payer: Self-pay | Admitting: Physician Assistant

## 2017-10-08 ENCOUNTER — Ambulatory Visit: Payer: BLUE CROSS/BLUE SHIELD | Admitting: Cardiology

## 2017-10-16 ENCOUNTER — Encounter: Payer: Self-pay | Admitting: Cardiology

## 2017-10-16 ENCOUNTER — Ambulatory Visit: Payer: BLUE CROSS/BLUE SHIELD | Admitting: Cardiology

## 2017-10-16 VITALS — BP 130/66 | HR 92 | Ht 75.0 in | Wt 364.8 lb

## 2017-10-16 DIAGNOSIS — Z9861 Coronary angioplasty status: Secondary | ICD-10-CM | POA: Diagnosis not present

## 2017-10-16 DIAGNOSIS — G4733 Obstructive sleep apnea (adult) (pediatric): Secondary | ICD-10-CM | POA: Diagnosis not present

## 2017-10-16 DIAGNOSIS — I255 Ischemic cardiomyopathy: Secondary | ICD-10-CM

## 2017-10-16 DIAGNOSIS — I10 Essential (primary) hypertension: Secondary | ICD-10-CM | POA: Diagnosis not present

## 2017-10-16 DIAGNOSIS — I2510 Atherosclerotic heart disease of native coronary artery without angina pectoris: Secondary | ICD-10-CM

## 2017-10-16 DIAGNOSIS — E118 Type 2 diabetes mellitus with unspecified complications: Secondary | ICD-10-CM | POA: Diagnosis not present

## 2017-10-16 NOTE — Progress Notes (Signed)
Cardiology Office Note:    Date:  10/16/2017   ID:  Joe [name], DOB Nov 16, 1970, MRN [id number]  PCP:  Joe Bender, PA-C  Cardiologist:  Joe Campus, MD    Referring MD: Joe Bender, PA-C   Chief Complaint  Patient presents with  . 1 month follow up  Doing well  History of Present Illness:    Joe [name] is a 47 y.o. male with coronary artery disease status post PTCA and stenting of LAD few months ago.  Doing well denies have any chest pain tightness squeezing pressure burning chest.  Still described to have some shortness of breath while walking is also when she is trying to bend forward he will get shortness of breath.  But overall feels dramatically better as compared to before his angioplasty.  He does have cardiomyopathy with a diminished ejection fraction with gradually to put him on the right medications.  I will ask him to have a EKG today if EKG is fine we will double the dose of carvedilol.  Past Medical History:  Diagnosis Date  . Abnormal EKG   . Body mass index 45.0-49.9, adult ([place])   . Complication of anesthesia 1995   "had hard time waking up; breathing w/vasectomy"  . Coronary artery disease   . Erectile dysfunction   . Essential hypertension, benign   . Fatigue   . GERD (gastroesophageal reflux disease)   . High cholesterol    "just started tx yesterday" (09/06/2017)  . History of gout    "RX prn" (09/06/2017)  . Muscular chest pain   . Obesity   . OSA on CPAP   . Plantar fasciitis   . Right bundle branch block   . Testosterone deficiency   . Type II diabetes mellitus ([place])    "started tx 08/28/2017"    Past Surgical History:  Procedure Laterality Date  . BACK SURGERY    . CORONARY ANGIOPLASTY WITH STENT PLACEMENT  09/06/2017   "3 stents"  . CORONARY STENT INTERVENTION N/A 09/06/2017   Procedure: CORONARY STENT INTERVENTION;  Surgeon: Jettie Booze, MD;  Location: [facility];  Service: Cardiovascular;  Laterality: N/A;  .  KNEE ARTHROSCOPY Right   . LEFT HEART CATH AND CORONARY ANGIOGRAPHY N/A 09/06/2017   Procedure: LEFT HEART CATH AND CORONARY ANGIOGRAPHY;  Surgeon: Jettie Booze, MD;  Location: [facility];  Service: Cardiovascular;  Laterality: N/A;  . LUMBAR SPINE SURGERY  ~ 2001   Intradiscal Electrothermoplasty  . VASECTOMY  1995    Current Medications: Current Meds  Medication Sig  . allopurinol (ZYLOPRIM) 100 MG tablet Take 100 mg by mouth daily as needed (gout).   Marland Kitchen aspirin EC 81 MG tablet Take 1 tablet (81 mg total) by mouth daily.  Marland Kitchen BRILINTA 90 MG TABS tablet TAKE 1 TABLET BY MOUTH TWICE A DAY  . carvedilol (COREG) 6.25 MG tablet Take 1 tablet (6.25 mg total) by mouth 2 (two) times daily with a meal.  . furosemide (LASIX) 40 MG tablet Take 1 tablet (40 mg total) by mouth daily.  . metFORMIN (GLUCOPHAGE-XR) 500 MG 24 hr tablet Take 500 mg by mouth 3 (three) times daily.   . nitroGLYCERIN (NITROSTAT) 0.4 MG SL tablet Place 1 tablet (0.4 mg total) under the tongue every 5 (five) minutes as needed for chest pain.  Marland Kitchen omeprazole (PRILOSEC) 40 MG capsule Take 40 mg by mouth daily.  . rosuvastatin (CRESTOR) 40 MG tablet Take 1 tablet (40 mg total) by mouth every  evening.  . sacubitril-valsartan (ENTRESTO) 49-51 MG Take 1 tablet by mouth 2 (two) times daily.  Marland Kitchen spironolactone (ALDACTONE) 25 MG tablet Take 0.5 tablets (12.5 mg total) by mouth daily.  . TESTOSTERONE IM Inject 200 mg into the muscle every 14 (fourteen) days.     Allergies:   Patient has no known allergies.   Social History   Socioeconomic History  . Marital status: Married    Spouse name: Not on file  . Number of children: Not on file  . Years of education: Not on file  . Highest education level: Not on file  Occupational History  . Not on file  Social Needs  . Financial resource strain: Not on file  . Food insecurity:    Worry: Not on file    Inability: Not on file  . Transportation needs:    Medical: Not on  file    Non-medical: Not on file  Tobacco Use  . Smoking status: Never Smoker  . Smokeless tobacco: Never Used  Substance and Sexual Activity  . Alcohol use: No    Frequency: Never  . Drug use: Never  . Sexual activity: Not Currently  Lifestyle  . Physical activity:    Days per week: Not on file    Minutes per session: Not on file  . Stress: Not on file  Relationships  . Social connections:    Talks on phone: Not on file    Gets together: Not on file    Attends religious service: Not on file    Active member of club or organization: Not on file    Attends meetings of clubs or organizations: Not on file    Relationship status: Not on file  Other Topics Concern  . Not on file  Social History Narrative  . Not on file     Family History: The patient's family history includes Brain cancer in his mother; Diabetes Mellitus II in his brother and paternal grandfather; Lung cancer in his mother; Lymphoma in his father. ROS:   Please see the history of present illness.    All 14 point review of systems negative except as described per history of present illness  EKGs/Labs/Other Studies Reviewed:      Recent Labs: 09/07/2017: Hemoglobin 15.2; Platelets 224 09/14/2017: BUN 15; Creatinine, Ser 1.12; Potassium 4.6; Sodium 137  Recent Lipid Panel No results found for: CHOL, TRIG, HDL, CHOLHDL, VLDL, LDLCALC, LDLDIRECT  Physical Exam:    VS:  BP 130/66   Pulse 92   Ht 6\' 3"  (1.905 m)   Wt (!) 364 lb 12.8 oz (165.5 kg)   SpO2 95%   BMI 45.60 kg/m     Wt Readings from Last 3 Encounters:  10/16/17 (!) 364 lb 12.8 oz (165.5 kg)  09/14/17 (!) 368 lb 6.4 oz (167.1 kg)  09/08/17 (!) 364 lb 3.2 oz (165.2 kg)     GEN:  Well nourished, well developed in no acute distress HEENT: Normal NECK: No JVD; No carotid bruits LYMPHATICS: No lymphadenopathy CARDIAC: RRR, no murmurs, no rubs, no gallops RESPIRATORY:  Clear to auscultation without rales, wheezing or rhonchi  ABDOMEN: Soft,  non-tender, non-distended MUSCULOSKELETAL:  No edema; No deformity  SKIN: Warm and dry LOWER EXTREMITIES: no swelling NEUROLOGIC:  Alert and oriented x 3 PSYCHIATRIC:  Normal affect   ASSESSMENT:    1. CAD S/P percutaneous coronary angioplasty   2. Essential hypertension, benign   3. Ischemic cardiomyopathy   4. Obstructive sleep apnea   5. Type  2 diabetes mellitus with complication, without long-term current use of insulin (HCC)    PLAN:    In order of problems listed above:  1. Coronary artery disease status post PTCA doing well we will continue present management. 2. Essential hypertension: Blood pressure seems to be well controlled continue present management. 3. Ischemic cardiomyopathy on Entresto which I will continue.  We will do EKG to see if we can increase dose of his beta-blocker. 4. Obstructive sleep apnea: Use mask on the regular basis doing well 5. Type 2 diabetes: Sugars still unacceptably high but he see his primary care physician quite often and hopefully his sugar will be better controlled.  In my opinion he can benefit from Jardiance   Medication Adjustments/Labs and Tests Ordered: Current medicines are reviewed at length with the patient today.  Concerns regarding medicines are outlined above.  No orders of the defined types were placed in this encounter.  Medication changes: No orders of the defined types were placed in this encounter.   Signed, Park Liter, MD, [facility] 10/16/2017 10:02 AM    [place]

## 2017-10-16 NOTE — Patient Instructions (Addendum)
Medication Instructions:  Your physician recommends that you continue on your current medications as directed. Please refer to the Current Medication list given to you today.  You were provided with entresto 49-51 mg samples today.   Labwork: None  Testing/Procedures: You had an EKG today.   Follow-Up: Your physician wants you to follow-up in: 3 months. You will receive a reminder letter in the mail two months in advance. If you don't receive a letter, please call our office to schedule the follow-up appointment.   Any Other Special Instructions Will Be Listed Below (If Applicable).     If you need a refill on your cardiac medications before your next appointment, please call your pharmacy.

## 2017-10-17 ENCOUNTER — Telehealth: Payer: Self-pay | Admitting: *Deleted

## 2017-10-17 NOTE — Telephone Encounter (Signed)
Returned Joe [name]'s call with Cover My Meds. She states that she needs to verify insurance information with the patient's pharmacy. Provided her with pharmacy phone number and she states she will call back with an update regarding prior authorization of entresto.

## 2017-10-19 NOTE — Telephone Encounter (Signed)
Received a phone call back from CoverMyMeds representative stating I needed to call and verify insurance information with patient/pharmacy. Received different insurance information than what we have on file. CoverMyMeds representative given this information. Will finish prior authorization information and resubmit.

## 2017-11-20 ENCOUNTER — Telehealth (HOSPITAL_COMMUNITY): Payer: Self-pay | Admitting: Pharmacist

## 2017-11-22 NOTE — Telephone Encounter (Signed)
Cardiac Rehab - Pharmacy Resident Documentation   Patient unable to be reached after three call attempts. Please complete allergy verification and medication review during patient's cardiac rehab appointment.    Joe [name], PharmD PGY1 Acute Care Pharmacy Resident

## 2017-11-27 ENCOUNTER — Ambulatory Visit (HOSPITAL_COMMUNITY): Payer: BLUE CROSS/BLUE SHIELD

## 2017-12-03 ENCOUNTER — Ambulatory Visit (HOSPITAL_COMMUNITY): Payer: BLUE CROSS/BLUE SHIELD

## 2017-12-05 ENCOUNTER — Ambulatory Visit (HOSPITAL_COMMUNITY): Payer: BLUE CROSS/BLUE SHIELD

## 2017-12-07 ENCOUNTER — Ambulatory Visit (HOSPITAL_COMMUNITY): Payer: BLUE CROSS/BLUE SHIELD

## 2017-12-10 ENCOUNTER — Ambulatory Visit (HOSPITAL_COMMUNITY): Payer: BLUE CROSS/BLUE SHIELD

## 2017-12-12 ENCOUNTER — Ambulatory Visit (HOSPITAL_COMMUNITY): Payer: BLUE CROSS/BLUE SHIELD

## 2017-12-14 ENCOUNTER — Ambulatory Visit (HOSPITAL_COMMUNITY): Payer: BLUE CROSS/BLUE SHIELD

## 2017-12-14 ENCOUNTER — Telehealth (HOSPITAL_COMMUNITY): Payer: Self-pay

## 2017-12-14 NOTE — Telephone Encounter (Signed)
Attempted to call patient to see if he was interested in rescheduling orientation for Cardiac Rehab - lm on vm

## 2017-12-17 ENCOUNTER — Ambulatory Visit (HOSPITAL_COMMUNITY): Payer: BLUE CROSS/BLUE SHIELD

## 2017-12-19 ENCOUNTER — Ambulatory Visit (HOSPITAL_COMMUNITY): Payer: BLUE CROSS/BLUE SHIELD

## 2017-12-21 ENCOUNTER — Ambulatory Visit (HOSPITAL_COMMUNITY): Payer: BLUE CROSS/BLUE SHIELD

## 2017-12-24 ENCOUNTER — Ambulatory Visit (HOSPITAL_COMMUNITY): Payer: BLUE CROSS/BLUE SHIELD

## 2017-12-26 ENCOUNTER — Ambulatory Visit (HOSPITAL_COMMUNITY): Payer: BLUE CROSS/BLUE SHIELD

## 2017-12-28 ENCOUNTER — Ambulatory Visit (HOSPITAL_COMMUNITY): Payer: BLUE CROSS/BLUE SHIELD

## 2017-12-31 ENCOUNTER — Ambulatory Visit (HOSPITAL_COMMUNITY): Payer: BLUE CROSS/BLUE SHIELD

## 2018-01-02 ENCOUNTER — Ambulatory Visit (HOSPITAL_COMMUNITY): Payer: BLUE CROSS/BLUE SHIELD

## 2018-01-04 ENCOUNTER — Ambulatory Visit (HOSPITAL_COMMUNITY): Payer: BLUE CROSS/BLUE SHIELD

## 2018-01-07 ENCOUNTER — Ambulatory Visit (HOSPITAL_COMMUNITY): Payer: BLUE CROSS/BLUE SHIELD

## 2018-01-09 ENCOUNTER — Ambulatory Visit (HOSPITAL_COMMUNITY): Payer: BLUE CROSS/BLUE SHIELD

## 2018-01-11 ENCOUNTER — Ambulatory Visit (HOSPITAL_COMMUNITY): Payer: BLUE CROSS/BLUE SHIELD

## 2018-01-14 ENCOUNTER — Ambulatory Visit (HOSPITAL_COMMUNITY): Payer: BLUE CROSS/BLUE SHIELD

## 2018-01-16 ENCOUNTER — Ambulatory Visit (HOSPITAL_COMMUNITY): Payer: BLUE CROSS/BLUE SHIELD

## 2018-01-18 ENCOUNTER — Ambulatory Visit (HOSPITAL_COMMUNITY): Payer: BLUE CROSS/BLUE SHIELD

## 2018-01-21 ENCOUNTER — Ambulatory Visit (HOSPITAL_COMMUNITY): Payer: BLUE CROSS/BLUE SHIELD

## 2018-01-23 ENCOUNTER — Ambulatory Visit (HOSPITAL_COMMUNITY): Payer: BLUE CROSS/BLUE SHIELD

## 2018-01-25 ENCOUNTER — Ambulatory Visit (HOSPITAL_COMMUNITY): Payer: BLUE CROSS/BLUE SHIELD

## 2018-01-28 ENCOUNTER — Ambulatory Visit (HOSPITAL_COMMUNITY): Payer: BLUE CROSS/BLUE SHIELD

## 2018-01-30 ENCOUNTER — Ambulatory Visit (HOSPITAL_COMMUNITY): Payer: BLUE CROSS/BLUE SHIELD

## 2018-02-01 ENCOUNTER — Ambulatory Visit (HOSPITAL_COMMUNITY): Payer: BLUE CROSS/BLUE SHIELD

## 2018-02-04 ENCOUNTER — Ambulatory Visit (HOSPITAL_COMMUNITY): Payer: BLUE CROSS/BLUE SHIELD

## 2018-02-06 ENCOUNTER — Ambulatory Visit (HOSPITAL_COMMUNITY): Payer: BLUE CROSS/BLUE SHIELD

## 2018-02-08 ENCOUNTER — Encounter: Payer: Self-pay | Admitting: Cardiology

## 2018-02-08 ENCOUNTER — Ambulatory Visit (HOSPITAL_COMMUNITY): Payer: BLUE CROSS/BLUE SHIELD

## 2018-02-08 ENCOUNTER — Ambulatory Visit (INDEPENDENT_AMBULATORY_CARE_PROVIDER_SITE_OTHER): Payer: BLUE CROSS/BLUE SHIELD | Admitting: Cardiology

## 2018-02-08 VITALS — BP 118/68 | HR 93 | Ht 75.0 in | Wt 377.0 lb

## 2018-02-08 DIAGNOSIS — I209 Angina pectoris, unspecified: Secondary | ICD-10-CM | POA: Diagnosis not present

## 2018-02-08 DIAGNOSIS — Z9861 Coronary angioplasty status: Secondary | ICD-10-CM

## 2018-02-08 DIAGNOSIS — I4510 Unspecified right bundle-branch block: Secondary | ICD-10-CM | POA: Diagnosis not present

## 2018-02-08 DIAGNOSIS — I2510 Atherosclerotic heart disease of native coronary artery without angina pectoris: Secondary | ICD-10-CM

## 2018-02-08 DIAGNOSIS — I10 Essential (primary) hypertension: Secondary | ICD-10-CM

## 2018-02-08 MED ORDER — RANOLAZINE ER 500 MG PO TB12: 500.0000 mg | ORAL_TABLET | Freq: Two times a day (BID) | ORAL | 3 refills | Status: DC

## 2018-02-08 NOTE — Progress Notes (Signed)
Cardiology Office Note:    Date:  02/08/2018   ID:  Joe [name], DOB 1970/12/20, MRN [id number]  PCP:  Cyndi Bender, PA-C  Cardiologist:  Jenne Campus, MD    Referring MD: Cyndi Bender, PA-C   No chief complaint on file. Not feeling well  History of Present Illness:    Joe [name] is a 47 y.o. male with coronary artery disease status post PTCA and stenting of LAD in March of this year.  He is been doing great after intervention however for last few weeks he started experiencing chest pain.  He said this is a similar to the sensation he had before.  He described to have some uneasy sensation of the chest did not have a chance to take nitroglycerin for it typically resting well relieved the pain.  Denies having any dizziness passing also swelling of lower extremities  Past Medical History:  Diagnosis Date  . Abnormal EKG   . Body mass index 45.0-49.9, adult ([place])   . Complication of anesthesia 1995   "had hard time waking up; breathing w/vasectomy"  . Coronary artery disease   . Erectile dysfunction   . Essential hypertension, benign   . Fatigue   . GERD (gastroesophageal reflux disease)   . High cholesterol    "just started tx yesterday" (09/06/2017)  . History of gout    "RX prn" (09/06/2017)  . Muscular chest pain   . Obesity   . OSA on CPAP   . Plantar fasciitis   . Right bundle branch block   . Testosterone deficiency   . Type II diabetes mellitus ([place])    "started tx 08/28/2017"    Past Surgical History:  Procedure Laterality Date  . BACK SURGERY    . CORONARY ANGIOPLASTY WITH STENT PLACEMENT  09/06/2017   "3 stents"  . CORONARY STENT INTERVENTION N/A 09/06/2017   Procedure: CORONARY STENT INTERVENTION;  Surgeon: Jettie Booze, MD;  Location: [facility];  Service: Cardiovascular;  Laterality: N/A;  . KNEE ARTHROSCOPY Right   . LEFT HEART CATH AND CORONARY ANGIOGRAPHY N/A 09/06/2017   Procedure: LEFT HEART CATH AND CORONARY ANGIOGRAPHY;   Surgeon: Jettie Booze, MD;  Location: [facility];  Service: Cardiovascular;  Laterality: N/A;  . LUMBAR SPINE SURGERY  ~ 2001   Intradiscal Electrothermoplasty  . VASECTOMY  1995    Current Medications: Current Meds  Medication Sig  . allopurinol (ZYLOPRIM) 100 MG tablet Take 100 mg by mouth daily as needed (gout).   Marland Kitchen aspirin EC 81 MG tablet Take 1 tablet (81 mg total) by mouth daily.  Marland Kitchen BRILINTA 90 MG TABS tablet TAKE 1 TABLET BY MOUTH TWICE A DAY  . carvedilol (COREG) 6.25 MG tablet Take 1 tablet (6.25 mg total) by mouth 2 (two) times daily with a meal.  . furosemide (LASIX) 40 MG tablet Take 1 tablet (40 mg total) by mouth daily.  . metFORMIN (GLUCOPHAGE-XR) 500 MG 24 hr tablet Take 500 mg by mouth 3 (three) times daily.   Marland Kitchen omeprazole (PRILOSEC) 40 MG capsule Take 40 mg by mouth daily.  . sacubitril-valsartan (ENTRESTO) 49-51 MG Take 1 tablet by mouth 2 (two) times daily.  Marland Kitchen spironolactone (ALDACTONE) 25 MG tablet Take 0.5 tablets (12.5 mg total) by mouth daily.     Allergies:   Patient has no known allergies.   Social History   Socioeconomic History  . Marital status: Married    Spouse name: Not on file  . Number of children:  Not on file  . Years of education: Not on file  . Highest education level: Not on file  Occupational History  . Not on file  Social Needs  . Financial resource strain: Not on file  . Food insecurity:    Worry: Not on file    Inability: Not on file  . Transportation needs:    Medical: Not on file    Non-medical: Not on file  Tobacco Use  . Smoking status: Never Smoker  . Smokeless tobacco: Never Used  Substance and Sexual Activity  . Alcohol use: No    Frequency: Never  . Drug use: Never  . Sexual activity: Not Currently  Lifestyle  . Physical activity:    Days per week: Not on file    Minutes per session: Not on file  . Stress: Not on file  Relationships  . Social connections:    Talks on phone: Not on file    Gets  together: Not on file    Attends religious service: Not on file    Active member of club or organization: Not on file    Attends meetings of clubs or organizations: Not on file    Relationship status: Not on file  Other Topics Concern  . Not on file  Social History Narrative  . Not on file     Family History: The patient's family history includes Brain cancer in his mother; Diabetes Mellitus II in his brother and paternal grandfather; Lung cancer in his mother; Lymphoma in his father. ROS:   Please see the history of present illness.    All 14 point review of systems negative except as described per history of present illness  EKGs/Labs/Other Studies Reviewed:      Recent Labs: 09/07/2017: Hemoglobin 15.2; Platelets 224 09/14/2017: BUN 15; Creatinine, Ser 1.12; Potassium 4.6; Sodium 137  Recent Lipid Panel No results found for: CHOL, TRIG, HDL, CHOLHDL, VLDL, LDLCALC, LDLDIRECT  Physical Exam:    VS:  BP 118/68 (BP Location: Right Arm, Patient Position: Sitting, Cuff Size: Normal)   Pulse 93   Ht 6\' 3"  (1.905 m)   Wt (!) 377 lb (171 kg)   SpO2 98%   BMI 47.12 kg/m     Wt Readings from Last 3 Encounters:  02/08/18 (!) 377 lb (171 kg)  10/16/17 (!) 364 lb 12.8 oz (165.5 kg)  09/14/17 (!) 368 lb 6.4 oz (167.1 kg)     GEN:  Well nourished, well developed in no acute distress HEENT: Normal NECK: No JVD; No carotid bruits LYMPHATICS: No lymphadenopathy CARDIAC: RRR, no murmurs, no rubs, no gallops RESPIRATORY:  Clear to auscultation without rales, wheezing or rhonchi  ABDOMEN: Soft, non-tender, non-distended MUSCULOSKELETAL:  No edema; No deformity  SKIN: Warm and dry LOWER EXTREMITIES: no swelling NEUROLOGIC:  Alert and oriented x 3 PSYCHIATRIC:  Normal affect   ASSESSMENT:    1. Angina pectoris ([place])   2. CAD S/P percutaneous coronary angioplasty   3. Essential hypertension, benign   4. Right bundle branch block    PLAN:    In order of problems listed  above:  1. Coronary artery disease it looks like we have reactivation of the problem history of having symptoms again starting about 3 weeks ago.  I will send in the hospital to get troponin I.  His EKG was nondiagnostic today.  I will also give him rales and if his troponin is negative we may elect to go with a stress testing however he is pushing  towards getting back to cardiac catheterization.  I know that he does have residual disease in 2 additional vessels therefore visualization of the location of the problem may be beneficial therefore I think stress test will be the way to go unless he get abnormal troponin I. 2. Right bundle branch block is a chronic problem.  Noted. 3. Essential hypertension blood pressure well controlled continue present management.   Medication Adjustments/Labs and Tests Ordered: Current medicines are reviewed at length with the patient today.  Concerns regarding medicines are outlined above.  Orders Placed This Encounter  Procedures  . Troponin I  . EKG 12-Lead   Medication changes:  Meds ordered this encounter  Medications  . ranolazine (RANEXA) 500 MG 12 hr tablet    Sig: Take 1 tablet (500 mg total) by mouth 2 (two) times daily.    Dispense:  60 tablet    Refill:  3    Signed, Park Liter, MD, [facility] 02/08/2018 2:56 PM    [place]

## 2018-02-08 NOTE — Patient Instructions (Addendum)
Medication Instructions:  Your physician has recommended you make the following change in your medication:   START ranolazine (ranexa) 500 mg twice daily    Labwork: Your physician recommends that you return for lab work today: STAT troponin I.   Testing/Procedures: You had an EKG today.   Follow-Up: Your physician recommends that you schedule a follow-up appointment in: 1 month.   If you need a refill on your cardiac medications before your next appointment, please call your pharmacy.   Thank you for choosing [facility]! Robyne Peers, RN [phone]   Cardiac-Specific Troponin I and T Test Why am I having this test? You may have this test if you have experienced chest pain. The test can be used to determine if you have had a heart attack or injury to heart (cardiac) muscle. This test can also help predict the possibility of future heart attacks. This test measures the concentration of cardiac-specific troponin in your blood. Troponins are proteins that help muscles contract. There are three forms of troponin, including troponins C, I, and T. The types of troponins I and T that are found in cardiac muscle are different from the troponins I and T that are found in skeletal muscle. Therefore, testing can be done for cardiac-specific troponins I and T. These types of troponin are normally present in very small quantities in the blood. When there is damage to heart muscle cells, cardiac troponins I and T are released into circulation. The more damage there is, the greater the concentration of troponins I and T. When a person has a heart attack, levels of troponin can become elevated in the blood within 3-4 hours after injury and may remain elevated for 10-14 days. What kind of sample is taken? A blood sample is required for this test. It is usually collected by inserting a needle into a vein. Usually, an initial blood sample is collected, and then another blood sample is collected  12 hours later. After these samples, you will have your blood tested daily for 3-5 days. You might also have it tested weekly for 5-6 weeks. How do I prepare for this test? There is no preparation required for this test. However, be aware that you will need to make arrangements to have your blood collected frequently. What are the reference ranges? Reference values are considered healthy values established after testing a large group of healthy people. Reference values may vary among different people, labs, and hospitals. It is your responsibility to obtain your test results. Ask the lab or department performing the test when and how you will get your results. Reference values for cardiac troponins are as follows:  Cardiac troponin T: less than 0.1 ng/mL.  Cardiac troponin I: less than 0.03 ng/mL.  What do the results mean? Troponin values above the reference values may indicate:  Injury to the heart muscle.  Heart attack.  Talk with your health care provider to discuss your results, treatment options, and if necessary, the need for more tests. Talk with your health care provider if you have any questions about your results. Talk with your health care provider to discuss your results, treatment options, and if necessary, the need for more tests. Talk with your health care provider if you have any questions about your results. This information is not intended to replace advice given to you by your health care provider. Make sure you discuss any questions you have with your health care provider. Document Released: 07/08/2004 Document Revised: 02/08/2016 Document Reviewed: 10/29/2013 Elsevier  Interactive Patient Education  [employer].

## 2018-02-08 NOTE — H&P (View-Only) (Signed)
Cardiology Office Note:    Date:  02/08/2018   ID:  Joe [name], DOB 07/27/1970, MRN [id number]  PCP:  Cyndi Bender, PA-C  Cardiologist:  Jenne Campus, MD    Referring MD: Cyndi Bender, PA-C   No chief complaint on file. Not feeling well  History of Present Illness:    Joe [name] is a 47 y.o. male with coronary artery disease status post PTCA and stenting of LAD in March of this year.  He is been doing great after intervention however for last few weeks he started experiencing chest pain.  He said this is a similar to the sensation he had before.  He described to have some uneasy sensation of the chest did not have a chance to take nitroglycerin for it typically resting well relieved the pain.  Denies having any dizziness passing also swelling of lower extremities  Past Medical History:  Diagnosis Date  . Abnormal EKG   . Body mass index 45.0-49.9, adult ([place])   . Complication of anesthesia 1995   "had hard time waking up; breathing w/vasectomy"  . Coronary artery disease   . Erectile dysfunction   . Essential hypertension, benign   . Fatigue   . GERD (gastroesophageal reflux disease)   . High cholesterol    "just started tx yesterday" (09/06/2017)  . History of gout    "RX prn" (09/06/2017)  . Muscular chest pain   . Obesity   . OSA on CPAP   . Plantar fasciitis   . Right bundle branch block   . Testosterone deficiency   . Type II diabetes mellitus ([place])    "started tx 08/28/2017"    Past Surgical History:  Procedure Laterality Date  . BACK SURGERY    . CORONARY ANGIOPLASTY WITH STENT PLACEMENT  09/06/2017   "3 stents"  . CORONARY STENT INTERVENTION N/A 09/06/2017   Procedure: CORONARY STENT INTERVENTION;  Surgeon: Jettie Booze, MD;  Location: [facility];  Service: Cardiovascular;  Laterality: N/A;  . KNEE ARTHROSCOPY Right   . LEFT HEART CATH AND CORONARY ANGIOGRAPHY N/A 09/06/2017   Procedure: LEFT HEART CATH AND CORONARY ANGIOGRAPHY;   Surgeon: Jettie Booze, MD;  Location: [facility];  Service: Cardiovascular;  Laterality: N/A;  . LUMBAR SPINE SURGERY  ~ 2001   Intradiscal Electrothermoplasty  . VASECTOMY  1995    Current Medications: Current Meds  Medication Sig  . allopurinol (ZYLOPRIM) 100 MG tablet Take 100 mg by mouth daily as needed (gout).   Marland Kitchen aspirin EC 81 MG tablet Take 1 tablet (81 mg total) by mouth daily.  Marland Kitchen BRILINTA 90 MG TABS tablet TAKE 1 TABLET BY MOUTH TWICE A DAY  . carvedilol (COREG) 6.25 MG tablet Take 1 tablet (6.25 mg total) by mouth 2 (two) times daily with a meal.  . furosemide (LASIX) 40 MG tablet Take 1 tablet (40 mg total) by mouth daily.  . metFORMIN (GLUCOPHAGE-XR) 500 MG 24 hr tablet Take 500 mg by mouth 3 (three) times daily.   Marland Kitchen omeprazole (PRILOSEC) 40 MG capsule Take 40 mg by mouth daily.  . sacubitril-valsartan (ENTRESTO) 49-51 MG Take 1 tablet by mouth 2 (two) times daily.  Marland Kitchen spironolactone (ALDACTONE) 25 MG tablet Take 0.5 tablets (12.5 mg total) by mouth daily.     Allergies:   Patient has no known allergies.   Social History   Socioeconomic History  . Marital status: Married    Spouse name: Not on file  . Number of children:  Not on file  . Years of education: Not on file  . Highest education level: Not on file  Occupational History  . Not on file  Social Needs  . Financial resource strain: Not on file  . Food insecurity:    Worry: Not on file    Inability: Not on file  . Transportation needs:    Medical: Not on file    Non-medical: Not on file  Tobacco Use  . Smoking status: Never Smoker  . Smokeless tobacco: Never Used  Substance and Sexual Activity  . Alcohol use: No    Frequency: Never  . Drug use: Never  . Sexual activity: Not Currently  Lifestyle  . Physical activity:    Days per week: Not on file    Minutes per session: Not on file  . Stress: Not on file  Relationships  . Social connections:    Talks on phone: Not on file    Gets  together: Not on file    Attends religious service: Not on file    Active member of club or organization: Not on file    Attends meetings of clubs or organizations: Not on file    Relationship status: Not on file  Other Topics Concern  . Not on file  Social History Narrative  . Not on file     Family History: The patient's family history includes Brain cancer in his mother; Diabetes Mellitus II in his brother and paternal grandfather; Lung cancer in his mother; Lymphoma in his father. ROS:   Please see the history of present illness.    All 14 point review of systems negative except as described per history of present illness  EKGs/Labs/Other Studies Reviewed:      Recent Labs: 09/07/2017: Hemoglobin 15.2; Platelets 224 09/14/2017: BUN 15; Creatinine, Ser 1.12; Potassium 4.6; Sodium 137  Recent Lipid Panel No results found for: CHOL, TRIG, HDL, CHOLHDL, VLDL, LDLCALC, LDLDIRECT  Physical Exam:    VS:  BP 118/68 (BP Location: Right Arm, Patient Position: Sitting, Cuff Size: Normal)   Pulse 93   Ht 6\' 3"  (1.905 m)   Wt (!) 377 lb (171 kg)   SpO2 98%   BMI 47.12 kg/m     Wt Readings from Last 3 Encounters:  02/08/18 (!) 377 lb (171 kg)  10/16/17 (!) 364 lb 12.8 oz (165.5 kg)  09/14/17 (!) 368 lb 6.4 oz (167.1 kg)     GEN:  Well nourished, well developed in no acute distress HEENT: Normal NECK: No JVD; No carotid bruits LYMPHATICS: No lymphadenopathy CARDIAC: RRR, no murmurs, no rubs, no gallops RESPIRATORY:  Clear to auscultation without rales, wheezing or rhonchi  ABDOMEN: Soft, non-tender, non-distended MUSCULOSKELETAL:  No edema; No deformity  SKIN: Warm and dry LOWER EXTREMITIES: no swelling NEUROLOGIC:  Alert and oriented x 3 PSYCHIATRIC:  Normal affect   ASSESSMENT:    1. Angina pectoris ([place])   2. CAD S/P percutaneous coronary angioplasty   3. Essential hypertension, benign   4. Right bundle branch block    PLAN:    In order of problems listed  above:  1. Coronary artery disease it looks like we have reactivation of the problem history of having symptoms again starting about 3 weeks ago.  I will send in the hospital to get troponin I.  His EKG was nondiagnostic today.  I will also give him rales and if his troponin is negative we may elect to go with a stress testing however he is pushing  towards getting back to cardiac catheterization.  I know that he does have residual disease in 2 additional vessels therefore visualization of the location of the problem may be beneficial therefore I think stress test will be the way to go unless he get abnormal troponin I. 2. Right bundle branch block is a chronic problem.  Noted. 3. Essential hypertension blood pressure well controlled continue present management.   Medication Adjustments/Labs and Tests Ordered: Current medicines are reviewed at length with the patient today.  Concerns regarding medicines are outlined above.  Orders Placed This Encounter  Procedures  . Troponin I  . EKG 12-Lead   Medication changes:  Meds ordered this encounter  Medications  . ranolazine (RANEXA) 500 MG 12 hr tablet    Sig: Take 1 tablet (500 mg total) by mouth 2 (two) times daily.    Dispense:  60 tablet    Refill:  3    Signed, Park Liter, MD, [name] 02/08/2018 2:56 PM    [place]

## 2018-02-11 ENCOUNTER — Encounter: Payer: Self-pay | Admitting: *Deleted

## 2018-02-11 ENCOUNTER — Ambulatory Visit (HOSPITAL_COMMUNITY): Payer: BLUE CROSS/BLUE SHIELD

## 2018-02-11 ENCOUNTER — Telehealth: Payer: Self-pay | Admitting: *Deleted

## 2018-02-11 DIAGNOSIS — Z01818 Encounter for other preprocedural examination: Secondary | ICD-10-CM | POA: Insufficient documentation

## 2018-02-11 HISTORY — DX: Encounter for other preprocedural examination: Z01.818

## 2018-02-11 NOTE — Telephone Encounter (Signed)
Called patient to check on him and see how he is doing per Dr. Agustin Cree. Patient is still experiencing occasional chest pain and shortness of breath on exertion. Dr. Agustin Cree recommended cardiac catheterization be done and patient is agreeable. Patient has been scheduled for heart catheterization on 02/15/18 at 10:30 am with Dr. Irish Lack. Reviewed pre-cath instructions and advised him to go to [facility] today or tomorrow for chest xray and lab work. Order forms have been faxed to [facility]. Patient verbalized understanding. No further questions. Copy of instructions sent to patient in the mail.

## 2018-02-12 ENCOUNTER — Other Ambulatory Visit: Payer: Self-pay

## 2018-02-12 DIAGNOSIS — Z01818 Encounter for other preprocedural examination: Secondary | ICD-10-CM

## 2018-02-13 ENCOUNTER — Ambulatory Visit (HOSPITAL_COMMUNITY): Payer: BLUE CROSS/BLUE SHIELD

## 2018-02-14 ENCOUNTER — Other Ambulatory Visit: Payer: Self-pay | Admitting: *Deleted

## 2018-02-14 ENCOUNTER — Telehealth: Payer: Self-pay | Admitting: *Deleted

## 2018-02-14 DIAGNOSIS — I209 Angina pectoris, unspecified: Secondary | ICD-10-CM

## 2018-02-14 DIAGNOSIS — Z01818 Encounter for other preprocedural examination: Secondary | ICD-10-CM

## 2018-02-14 NOTE — Telephone Encounter (Addendum)
Pt contacted pre-catheterization scheduled at [facility] for: Friday Augsut 30, 3019 10:30 AM Verified arrival time and place: [facility] Entrance A at: 8 AM  No solid food after midnight prior to cath, clear liquids until 5 AM day of procedure.  Hold: Metformin -day of procedure and 48 hours post procedure. Spironolactone -AM of procedure Furosemide-AM of procedure  Except hold medications AM meds can be  taken pre-cath with sip of water including: ASA 81 mg Brilinta 90 mg  Confirm patient has responsible person to drive home post procedure and for 24 hours after you arrive home.   LMTCB to discuss procedure instructions with patient.  I discussed instructions  with patient, he verbalized understanding, thanked me for call.

## 2018-02-15 ENCOUNTER — Other Ambulatory Visit: Payer: Self-pay

## 2018-02-15 ENCOUNTER — Ambulatory Visit (HOSPITAL_COMMUNITY): Payer: BLUE CROSS/BLUE SHIELD

## 2018-02-15 ENCOUNTER — Ambulatory Visit (HOSPITAL_COMMUNITY)
Admission: RE | Admit: 2018-02-15 | Discharge: 2018-02-15 | Disposition: A | Discharge status: Home / Self Care | Payer: BLUE CROSS/BLUE SHIELD | Source: Ambulatory Visit | Attending: Interventional Cardiology | Admitting: Interventional Cardiology

## 2018-02-15 ENCOUNTER — Encounter (HOSPITAL_COMMUNITY)
Admission: RE | Disposition: A | Discharge status: Home / Self Care | Payer: Self-pay | Source: Ambulatory Visit | Attending: Interventional Cardiology

## 2018-02-15 DIAGNOSIS — Z9889 Other specified postprocedural states: Secondary | ICD-10-CM | POA: Diagnosis not present

## 2018-02-15 DIAGNOSIS — I2510 Atherosclerotic heart disease of native coronary artery without angina pectoris: Secondary | ICD-10-CM

## 2018-02-15 DIAGNOSIS — I209 Angina pectoris, unspecified: Secondary | ICD-10-CM | POA: Diagnosis present

## 2018-02-15 DIAGNOSIS — Z6841 Body Mass Index (BMI) 40.0 and over, adult: Secondary | ICD-10-CM | POA: Insufficient documentation

## 2018-02-15 DIAGNOSIS — K219 Gastro-esophageal reflux disease without esophagitis: Secondary | ICD-10-CM | POA: Diagnosis not present

## 2018-02-15 DIAGNOSIS — E119 Type 2 diabetes mellitus without complications: Secondary | ICD-10-CM | POA: Diagnosis not present

## 2018-02-15 DIAGNOSIS — M109 Gout, unspecified: Secondary | ICD-10-CM | POA: Insufficient documentation

## 2018-02-15 DIAGNOSIS — Z833 Family history of diabetes mellitus: Secondary | ICD-10-CM | POA: Insufficient documentation

## 2018-02-15 DIAGNOSIS — Z79899 Other long term (current) drug therapy: Secondary | ICD-10-CM | POA: Insufficient documentation

## 2018-02-15 DIAGNOSIS — E118 Type 2 diabetes mellitus with unspecified complications: Secondary | ICD-10-CM | POA: Diagnosis present

## 2018-02-15 DIAGNOSIS — E669 Obesity, unspecified: Secondary | ICD-10-CM | POA: Diagnosis not present

## 2018-02-15 DIAGNOSIS — G4733 Obstructive sleep apnea (adult) (pediatric): Secondary | ICD-10-CM | POA: Diagnosis present

## 2018-02-15 DIAGNOSIS — E7800 Pure hypercholesterolemia, unspecified: Secondary | ICD-10-CM | POA: Diagnosis not present

## 2018-02-15 DIAGNOSIS — Z7984 Long term (current) use of oral hypoglycemic drugs: Secondary | ICD-10-CM | POA: Insufficient documentation

## 2018-02-15 DIAGNOSIS — Z955 Presence of coronary angioplasty implant and graft: Secondary | ICD-10-CM

## 2018-02-15 DIAGNOSIS — I4510 Unspecified right bundle-branch block: Secondary | ICD-10-CM | POA: Diagnosis present

## 2018-02-15 DIAGNOSIS — Z7982 Long term (current) use of aspirin: Secondary | ICD-10-CM | POA: Insufficient documentation

## 2018-02-15 DIAGNOSIS — Z9861 Coronary angioplasty status: Secondary | ICD-10-CM

## 2018-02-15 DIAGNOSIS — I10 Essential (primary) hypertension: Secondary | ICD-10-CM | POA: Diagnosis present

## 2018-02-15 DIAGNOSIS — I25118 Atherosclerotic heart disease of native coronary artery with other forms of angina pectoris: Secondary | ICD-10-CM

## 2018-02-15 DIAGNOSIS — I25119 Atherosclerotic heart disease of native coronary artery with unspecified angina pectoris: Secondary | ICD-10-CM | POA: Insufficient documentation

## 2018-02-15 DIAGNOSIS — Z9852 Vasectomy status: Secondary | ICD-10-CM | POA: Insufficient documentation

## 2018-02-15 HISTORY — PX: LEFT HEART CATH AND CORONARY ANGIOGRAPHY: CATH118249

## 2018-02-15 HISTORY — PX: CORONARY STENT INTERVENTION: CATH118234

## 2018-02-15 LAB — GLUCOSE, CAPILLARY
Glucose-Capillary: 164 mg/dL — ABNORMAL HIGH (ref 70–99)
Glucose-Capillary: 132 mg/dL — ABNORMAL HIGH (ref 70–99)

## 2018-02-15 LAB — POCT ACTIVATED CLOTTING TIME: Activated Clotting Time: 384 seconds

## 2018-02-15 SURGERY — LEFT HEART CATH AND CORONARY ANGIOGRAPHY
Anesthesia: LOCAL

## 2018-02-15 MED ORDER — MIDAZOLAM HCL 2 MG/2ML IJ SOLN
INTRAMUSCULAR | Status: AC
  Filled 2018-02-15: qty 2

## 2018-02-15 MED ORDER — TICAGRELOR 90 MG PO TABS: 90.0000 mg | ORAL_TABLET | Freq: Two times a day (BID) | ORAL | Status: DC

## 2018-02-15 MED ORDER — CARVEDILOL 6.25 MG PO TABS: 6.2500 mg | ORAL_TABLET | Freq: Two times a day (BID) | ORAL | Status: DC

## 2018-02-15 MED ORDER — ASPIRIN 81 MG PO CHEW: 81.0000 mg | CHEWABLE_TABLET | ORAL | Status: DC

## 2018-02-15 MED ORDER — FUROSEMIDE 40 MG PO TABS: 40.0000 mg | ORAL_TABLET | Freq: Every day | ORAL | Status: DC

## 2018-02-15 MED ORDER — HEPARIN SODIUM (PORCINE) 1000 UNIT/ML IJ SOLN
INTRAMUSCULAR | Status: DC | PRN
  Administered 2018-02-15: 6000 [IU] via INTRAVENOUS
  Administered 2018-02-15: 10000 [IU] via INTRAVENOUS

## 2018-02-15 MED ORDER — ONDANSETRON HCL 4 MG/2ML IJ SOLN: 4.0000 mg | Freq: Four times a day (QID) | INTRAMUSCULAR | Status: DC | PRN

## 2018-02-15 MED ORDER — LIDOCAINE HCL (PF) 1 % IJ SOLN
INTRAMUSCULAR | Status: DC | PRN
  Administered 2018-02-15: 3 mL

## 2018-02-15 MED ORDER — NITROGLYCERIN 0.4 MG SL SUBL: 0.4000 mg | SUBLINGUAL_TABLET | SUBLINGUAL | Status: DC | PRN

## 2018-02-15 MED ORDER — SPIRONOLACTONE 12.5 MG HALF TABLET: 12.5000 mg | ORAL_TABLET | Freq: Every day | ORAL | Status: DC

## 2018-02-15 MED ORDER — NITROGLYCERIN 1 MG/10 ML FOR IR/CATH LAB
INTRA_ARTERIAL | Status: DC | PRN
  Administered 2018-02-15: 200 ug via INTRACORONARY

## 2018-02-15 MED ORDER — HEPARIN (PORCINE) IN NACL 1000-0.9 UT/500ML-% IV SOLN
INTRAVENOUS | Status: AC
  Filled 2018-02-15: qty 1000

## 2018-02-15 MED ORDER — SACUBITRIL-VALSARTAN 49-51 MG PO TABS: 1.0000 | ORAL_TABLET | Freq: Two times a day (BID) | ORAL | Status: DC

## 2018-02-15 MED ORDER — ASPIRIN EC 81 MG PO TBEC: 81.0000 mg | DELAYED_RELEASE_TABLET | Freq: Every day | ORAL | Status: DC

## 2018-02-15 MED ORDER — LIDOCAINE HCL (PF) 1 % IJ SOLN
INTRAMUSCULAR | Status: AC
  Filled 2018-02-15: qty 30

## 2018-02-15 MED ORDER — FENTANYL CITRATE (PF) 100 MCG/2ML IJ SOLN
INTRAMUSCULAR | Status: AC
  Filled 2018-02-15: qty 2

## 2018-02-15 MED ORDER — SODIUM CHLORIDE 0.9% FLUSH: 3.0000 mL | Freq: Two times a day (BID) | INTRAVENOUS | Status: DC

## 2018-02-15 MED ORDER — SODIUM CHLORIDE 0.9 % IV SOLN
INTRAVENOUS | Status: DC
  Administered 2018-02-15: 09:00:00 via INTRAVENOUS

## 2018-02-15 MED ORDER — SODIUM CHLORIDE 0.9 % IV SOLN: INTRAVENOUS | Status: AC

## 2018-02-15 MED ORDER — VERAPAMIL HCL 2.5 MG/ML IV SOLN
INTRAVENOUS | Status: AC
  Filled 2018-02-15: qty 2

## 2018-02-15 MED ORDER — SODIUM CHLORIDE 0.9 % IV SOLN: 250.0000 mL | INTRAVENOUS | Status: DC | PRN

## 2018-02-15 MED ORDER — SODIUM CHLORIDE 0.9% FLUSH: 3.0000 mL | INTRAVENOUS | Status: DC | PRN

## 2018-02-15 MED ORDER — PANTOPRAZOLE SODIUM 40 MG PO TBEC: 40.0000 mg | DELAYED_RELEASE_TABLET | Freq: Every day | ORAL | Status: DC

## 2018-02-15 MED ORDER — ASPIRIN 81 MG PO CHEW: 81.0000 mg | CHEWABLE_TABLET | Freq: Every day | ORAL | Status: DC

## 2018-02-15 MED ORDER — IOHEXOL 350 MG/ML SOLN
INTRAVENOUS | Status: DC | PRN
  Administered 2018-02-15: 165 mL via INTRA_ARTERIAL

## 2018-02-15 MED ORDER — ROSUVASTATIN CALCIUM 40 MG PO TABS: 40.0000 mg | ORAL_TABLET | Freq: Every evening | ORAL | Status: DC

## 2018-02-15 MED ORDER — HEPARIN (PORCINE) IN NACL 1000-0.9 UT/500ML-% IV SOLN
INTRAVENOUS | Status: DC | PRN
  Administered 2018-02-15 (×2): 500 mL

## 2018-02-15 MED ORDER — VERAPAMIL HCL 2.5 MG/ML IV SOLN
INTRAVENOUS | Status: DC | PRN
  Administered 2018-02-15: 10 mL via INTRA_ARTERIAL

## 2018-02-15 MED ORDER — FENTANYL CITRATE (PF) 100 MCG/2ML IJ SOLN
INTRAMUSCULAR | Status: DC | PRN
  Administered 2018-02-15 (×2): 25 ug via INTRAVENOUS

## 2018-02-15 MED ORDER — HYDRALAZINE HCL 20 MG/ML IJ SOLN: 5.0000 mg | INTRAMUSCULAR | Status: DC | PRN

## 2018-02-15 MED ORDER — MIDAZOLAM HCL 2 MG/2ML IJ SOLN
INTRAMUSCULAR | Status: DC | PRN
  Administered 2018-02-15: 2 mg via INTRAVENOUS

## 2018-02-15 MED ORDER — LABETALOL HCL 5 MG/ML IV SOLN: 10.0000 mg | INTRAVENOUS | Status: DC | PRN

## 2018-02-15 MED ORDER — NITROGLYCERIN 1 MG/10 ML FOR IR/CATH LAB
INTRA_ARTERIAL | Status: AC
  Filled 2018-02-15: qty 10

## 2018-02-15 MED ORDER — ACETAMINOPHEN 325 MG PO TABS: 650.0000 mg | ORAL_TABLET | ORAL | Status: DC | PRN

## 2018-02-15 MED ORDER — HEPARIN SODIUM (PORCINE) 1000 UNIT/ML IJ SOLN
INTRAMUSCULAR | Status: AC
  Filled 2018-02-15: qty 1

## 2018-02-15 MED ORDER — ALLOPURINOL 100 MG PO TABS: 100.0000 mg | ORAL_TABLET | Freq: Every day | ORAL | Status: DC | PRN

## 2018-02-15 SURGICAL SUPPLY — 20 items
BALLN EUPHORA RX 2.5X15 (BALLOONS) ×2
BALLN ~~LOC~~ EUPHORA RX 3.25X12 (BALLOONS) ×2
BALLOON EUPHORA RX 2.5X15 (BALLOONS) ×1 IMPLANT
BALLOON ~~LOC~~ EUPHORA RX 3.25X12 (BALLOONS) ×1 IMPLANT
CATH IMPULSE 5F ANG/FL3.5 (CATHETERS) ×2 IMPLANT
CATH LAUNCHER 6FR EBU3.5 (CATHETERS) ×2 IMPLANT
DEVICE RAD COMP TR BAND LRG (VASCULAR PRODUCTS) ×2 IMPLANT
GLIDESHEATH SLEND SS 6F .021 (SHEATH) ×2 IMPLANT
GUIDEWIRE INQWIRE 1.5J.035X260 (WIRE) ×1 IMPLANT
INQWIRE 1.5J .035X260CM (WIRE) ×2
KIT ENCORE 26 ADVANTAGE (KITS) ×2 IMPLANT
KIT HEART LEFT (KITS) ×2 IMPLANT
KIT HEMO VALVE WATCHDOG (MISCELLANEOUS) ×2 IMPLANT
PACK CARDIAC CATHETERIZATION (CUSTOM PROCEDURE TRAY) ×2 IMPLANT
STENT RESOLUTE ONYX 2.75X22 (Permanent Stent) ×2 IMPLANT
STENT RESOLUTE ONYX 3.0X18 (Permanent Stent) ×2 IMPLANT
TRANSDUCER W/STOPCOCK (MISCELLANEOUS) ×2 IMPLANT
TUBING CIL FLEX 10 FLL-RA (TUBING) ×2 IMPLANT
WIRE ASAHI PROWATER 180CM (WIRE) ×2 IMPLANT
WIRE HI TORQ BMW 190CM (WIRE) ×2 IMPLANT

## 2018-02-15 NOTE — Discharge Instructions (Signed)
Radial Site Care Refer to this sheet in the next few weeks. These instructions provide you with information about caring for yourself after your procedure. Your health care provider may also give you more specific instructions. Your treatment has been planned according to current medical practices, but problems sometimes occur. Call your health care provider if you have any problems or questions after your procedure. What can I expect after the procedure? After your procedure, it is typical to have the following:  Bruising at the radial site that usually fades within 1-2 weeks.  Blood collecting in the tissue (hematoma) that may be painful to the touch. It should usually decrease in size and tenderness within 1-2 weeks.  Follow these instructions at home:  Take medicines only as directed by your health care provider.  You may shower 24-48 hours after the procedure or as directed by your health care provider. Remove the bandage (dressing) and gently wash the site with plain soap and water. Pat the area dry with a clean towel. Do not rub the site, because this may cause bleeding.  Do not take baths, swim, or use a hot tub until your health care provider approves.  Check your insertion site every day for redness, swelling, or drainage.  Do not apply powder or lotion to the site.  Do not flex or bend the affected arm for 24 hours or as directed by your health care provider.  Do not push or pull heavy objects with the affected arm for 24 hours or as directed by your health care provider.  Do not lift over 10 lb (4.5 kg) for 5 days after your procedure or as directed by your health care provider.  Ask your health care provider when it is okay to: ? Return to work or school. ? Resume usual physical activities or sports. ? Resume sexual activity.  Do not drive home if you are discharged the same day as the procedure. Have someone else drive you.  You may drive 24 hours after the procedure  unless otherwise instructed by your health care provider.  Do not operate machinery or power tools for 24 hours after the procedure.  If your procedure was done as an outpatient procedure, which means that you went home the same day as your procedure, a responsible adult should be with you for the first 24 hours after you arrive home.  Keep all follow-up visits as directed by your health care provider. This is important. Contact a health care provider if:  You have a fever.  You have chills.  You have increased bleeding from the radial site. Hold pressure on the site. Get help right away if:  You have unusual pain at the radial site.  You have redness, warmth, or swelling at the radial site.  You have drainage (other than a small amount of blood on the dressing) from the radial site.  The radial site is bleeding, and the bleeding does not stop after 30 minutes of holding steady pressure on the site.  Your arm or hand becomes pale, cool, tingly, or numb. This information is not intended to replace advice given to you by your health care provider. Make sure you discuss any questions you have with your health care provider. Document Released: 07/08/2010 Document Revised: 11/11/2015 Document Reviewed: 12/22/2013 Elsevier Interactive Patient Education  2018 [place] about your medication: Brilinta (anti-platelet agent)  Generic Name (Brand): ticagrelor (Brilinta), twice daily medication  PURPOSE: You are taking  this medication along with aspirin to lower your chance of having a heart attack, stroke, or blood clots in your heart stent. These can be fatal. Brilinta and aspirin help prevent platelets from sticking together and forming a clot that can block an artery or your stent.   Common SIDE EFFECTS you may experience include: bruising or bleeding more easily, shortness of breath  Do not stop taking BRILINTA without talking to the doctor who prescribes it  for you. People who are treated with a stent and stop taking Brilinta too soon, have a higher risk of getting a blood clot in the stent, having a heart attack, or dying. If you stop Brilinta because of bleeding, or for other reasons, your risk of a heart attack or stroke may increase.   Tell all of your doctors and dentists that you are taking Brilinta. They should talk to the doctor who prescribed Brilinta for you before you have any surgery or invasive procedure.   Contact your health care provider if you experience: severe or uncontrollable bleeding, pink/red/brown urine, vomiting blood or vomit that looks like "coffee grounds", red or black stools (looks like tar), coughing up blood or blood clots ----------------------------------------------------------------------------------------------------------------------

## 2018-02-15 NOTE — Interval H&P Note (Signed)
Cath Lab Visit (complete for each Cath Lab visit)  Clinical Evaluation Leading to the Procedure:   ACS: No.  Non-ACS:    Anginal Classification: CCS III  Anti-ischemic medical therapy: Minimal Therapy (1 class of medications)  Non-Invasive Test Results: No non-invasive testing performed  Prior CABG: No previous CABG      History and Physical Interval Note:  02/15/2018 10:51 AM  Joe [name]  has presented today for surgery, with the diagnosis of cp - shortness of breath  The various methods of treatment have been discussed with the patient and family. After consideration of risks, benefits and other options for treatment, the patient has consented to  Procedure(s): LEFT HEART CATH AND CORONARY ANGIOGRAPHY (N/A) as a surgical intervention .  The patient's history has been reviewed, patient examined, no change in status, stable for surgery.  I have reviewed the patient's chart and labs.  Questions were answered to the patient's satisfaction.     Larae Grooms

## 2018-02-15 NOTE — Progress Notes (Signed)
CARDIAC REHAB PHASE I   Education completed with pt and wife. Pt and wife educated on importance of Brilinta, ASA, statin, and NTG. Pt given stent card. Talked with pt about stress and stress reduction. Pt relates a lot of stress to his job. Pt given heart healthy and diabetic diets. Reviewed restrictions and exercise guidelines. Will refer to CRP II [place], and encouraged pt to attend.  [id number] Rufina Falco, RN BSN 02/15/2018 1:38 PM

## 2018-02-15 NOTE — Discharge Summary (Addendum)
Discharge Summary    Patient ID: Joe [name],  MRN: [id number], DOB/AGE: [birth year] 47 y.o.  Admit date: 02/15/2018 Discharge date: 02/15/2018  Primary Care Provider: Cyndi Bender Primary Cardiologist: Jenne Campus, MD  Discharge Diagnoses    Principal Problem:   CAD S/P percutaneous coronary angioplasty Active Problems:   Obstructive sleep apnea   Essential hypertension, benign   Obesity   Right bundle branch block   Type 2 diabetes mellitus with complication, without long-term current use of insulin (HCC)   Allergies No Known Allergies  Diagnostic Studies/Procedures    Cath 02/15/2018  Dist LAD lesion is 40% stenosed.  Previously placed Mid LAD-2 drug eluting stent is widely patent.  Previously placed Mid LAD-1 drug eluting stent is widely patent.  Mid RCA lesion is 25% stenosed.  Ost RPDA to RPDA lesion is 50% stenosed.  Dist RCA lesion is 25% stenosed with 25% stenosed side branch in Post Atrio.  Mid Cx lesion is 75% stenosed.  A drug-eluting stent was successfully placed using a STENT RESOLUTE ONYX [id number].  Post intervention, there is a 0% residual stenosis.  Prox Cx lesion is 80% stenosed.  A drug-eluting stent was successfully placed using a STENT RESOLUTE ONYX [id number].  Post intervention, there is a 0% residual stenosis.  The left ventricular ejection fraction is 35-45% by visual estimate.  LV end diastolic pressure is mildly elevated.  There is mild to moderate left ventricular systolic dysfunction.  There is no aortic valve stenosis.     Recommend uninterrupted dual antiplatelet therapy with Aspirin 81mg  daily and Ticagrelor 90mg  twice daily for a minimum of 12 months (ACS - Class I recommendation).   _____________   History of Present Illness     Joe [name] is a 47 y.o. male with coronary artery disease status post PTCA and stenting of LAD in March of this year.  He is been doing great after intervention however for  last few weeks he started experiencing chest pain.  He said this is a similar to the sensation he had before.  He described to have some uneasy sensation of the chest did not have a chance to take nitroglycerin for it typically resting well relieved the pain.  Denies having any dizziness passing also swelling of lower extremities.  Patient was prescribed Ranexa by Dr. Agustin Cree during the last office visit on 02/08/2018, unfortunately he never started on this.  Due to persistent intermittent chest pain, Dr. Agustin Cree recommended cardiac catheterization for more definitive evaluation.  Hospital Course    Patient presented for the planned procedure on 02/15/2018 which showed 40% distal LAD lesion, widely patent mid LAD stents, 50% ostial RPDA lesion, 75% mid left circumflex lesion treated with a 2.75 x 22 mm DES, 80% proximal left circumflex lesion treated with a 3.0 x 18 mm Onyx resolute DES.  Postprocedure, patient was instructed to continue aspirin and Brilinta for minimum of 12 months.  He will need to hold metformin for 2 days after cardiac catheterization, the first day to restart metformin is on February 17, 2018.  He will need to hold Lasix for the next 24 hours and memory resume Lasix afterward.  He has been instructed to take additional dose of Lasix on a as needed basis if weight increased by more than 3 pounds overnight or 5 pounds in single week.  Ranexa has been discontinued during this admission since the patient never started on it.  _____________  Discharge Vitals Blood pressure 95/64, pulse 81, temperature 98 F (  36.7 C), temperature source Oral, resp. rate 15, height 6\' 3"  (1.905 m), weight (!) 163.3 kg, SpO2 96 %.  Filed Weights   02/15/18 0804  Weight: (!) 163.3 kg    Labs & Radiologic Studies    CBC No results for input(s): WBC, NEUTROABS, HGB, HCT, MCV, PLT in the last 72 hours. Basic Metabolic Panel No results for input(s): NA, K, CL, CO2, GLUCOSE, BUN, CREATININE, CALCIUM,  MG, PHOS in the last 72 hours. Liver Function Tests No results for input(s): AST, ALT, ALKPHOS, BILITOT, PROT, ALBUMIN in the last 72 hours. No results for input(s): LIPASE, AMYLASE in the last 72 hours. Cardiac Enzymes No results for input(s): CKTOTAL, CKMB, CKMBINDEX, TROPONINI in the last 72 hours. BNP Invalid input(s): POCBNP D-Dimer No results for input(s): DDIMER in the last 72 hours. Hemoglobin A1C No results for input(s): HGBA1C in the last 72 hours. Fasting Lipid Panel No results for input(s): CHOL, HDL, LDLCALC, TRIG, CHOLHDL, LDLDIRECT in the last 72 hours. Thyroid Function Tests No results for input(s): TSH, T4TOTAL, T3FREE, THYROIDAB in the last 72 hours.  Invalid input(s): FREET3 _____________  No results found. Disposition   Pt is being discharged home today in good condition.  Follow-up Plans & Appointments    Follow-up Information    Park Liter, MD Follow up.   Specialty:  Cardiology Why:  Keep appt Sept 9 Contact information: [place] [address] [phone]          Discharge Instructions    AMB Referral to Cardiac Rehabilitation - Phase II   Complete by:  As directed    Diagnosis:   Coronary Stents Stable Angina     Amb Referral to Cardiac Rehabilitation   Complete by:  As directed    Diagnosis:  Coronary Stents      Discharge Medications   Allergies as of 02/15/2018   No Known Allergies     Medication List    STOP taking these medications   ranolazine 500 MG 12 hr tablet Commonly known as:  RANEXA     TAKE these medications   allopurinol 100 MG tablet Commonly known as:  ZYLOPRIM Take 100 mg by mouth daily as needed (gout).   aspirin EC 81 MG tablet Take 1 tablet (81 mg total) by mouth daily.   BRILINTA 90 MG Tabs tablet Generic drug:  ticagrelor TAKE 1 TABLET BY MOUTH TWICE A DAY   carvedilol 6.25 MG tablet Commonly known as:  COREG Take 1 tablet (6.25 mg total) by mouth 2 (two) times  daily with a meal.   furosemide 40 MG tablet Commonly known as:  LASIX Take 1 tablet (40 mg total) by mouth daily.   metFORMIN 500 MG 24 hr tablet Commonly known as:  GLUCOPHAGE-XR Take 500 mg by mouth 3 (three) times daily.   nitroGLYCERIN 0.4 MG SL tablet Commonly known as:  NITROSTAT Place 1 tablet (0.4 mg total) under the tongue every 5 (five) minutes as needed for chest pain.   omeprazole 40 MG capsule Commonly known as:  PRILOSEC Take 40 mg by mouth daily.   rosuvastatin 40 MG tablet Commonly known as:  CRESTOR Take 1 tablet (40 mg total) by mouth every evening.   sacubitril-valsartan 49-51 MG Commonly known as:  ENTRESTO Take 1 tablet by mouth 2 (two) times daily.   spironolactone 25 MG tablet Commonly known as:  ALDACTONE Take 0.5 tablets (12.5 mg total) by mouth daily.        Acute coronary  syndrome (MI, NSTEMI, STEMI, etc) this admission?: No.    Outstanding Labs/Studies   None  Duration of Discharge Encounter   Greater than 30 minutes including physician time.  Signed, Almyra Deforest, PA 02/15/2018, 4:55 PM  I have examined the patient and reviewed assessment and plan and discussed with patient.  Agree with above as stated.  S/p stents to circumflex.  EF still appears to be low.  He would benefit from diuresis as hs LVEDP was elevated.  He needs aggressive medical therapy and RF modification.  F/u with Dr. Agustin Cree.   Larae Grooms

## 2018-02-18 ENCOUNTER — Encounter (HOSPITAL_COMMUNITY): Payer: Self-pay | Admitting: Interventional Cardiology

## 2018-02-20 ENCOUNTER — Ambulatory Visit (HOSPITAL_COMMUNITY): Payer: BLUE CROSS/BLUE SHIELD

## 2018-02-20 ENCOUNTER — Telehealth (HOSPITAL_COMMUNITY): Payer: Self-pay

## 2018-02-20 ENCOUNTER — Telehealth: Payer: Self-pay | Admitting: Emergency Medicine

## 2018-02-20 ENCOUNTER — Other Ambulatory Visit: Payer: Self-pay | Admitting: Physician Assistant

## 2018-02-20 MED ORDER — CARVEDILOL 6.25 MG PO TABS: 6.2500 mg | ORAL_TABLET | Freq: Two times a day (BID) | ORAL | 3 refills | Status: DC

## 2018-02-20 NOTE — Telephone Encounter (Signed)
Attempted to contact patient in regards to Cardiac Rehab - lm on vm °

## 2018-02-20 NOTE — Telephone Encounter (Signed)
Left message for patient to return call.

## 2018-02-20 NOTE — Telephone Encounter (Signed)
Patients insurance is active and benefits verified through [place] - No co-pay, deductible amount of $500.00/$500.00 has been met, out of pocket amount of $6,000/$6,000 has been met, no co-insurance, and no pre-authorization is required. Passport/reference [id number]

## 2018-02-22 ENCOUNTER — Ambulatory Visit (HOSPITAL_COMMUNITY): Payer: BLUE CROSS/BLUE SHIELD

## 2018-02-25 ENCOUNTER — Ambulatory Visit (HOSPITAL_COMMUNITY): Payer: BLUE CROSS/BLUE SHIELD

## 2018-02-25 ENCOUNTER — Encounter: Payer: Self-pay | Admitting: Cardiology

## 2018-02-25 ENCOUNTER — Ambulatory Visit (INDEPENDENT_AMBULATORY_CARE_PROVIDER_SITE_OTHER): Payer: BLUE CROSS/BLUE SHIELD | Admitting: Cardiology

## 2018-02-25 ENCOUNTER — Ambulatory Visit: Payer: BLUE CROSS/BLUE SHIELD | Admitting: Cardiology

## 2018-02-25 VITALS — BP 112/64 | HR 91 | Ht 75.0 in | Wt 372.8 lb

## 2018-02-25 DIAGNOSIS — I10 Essential (primary) hypertension: Secondary | ICD-10-CM

## 2018-02-25 DIAGNOSIS — I209 Angina pectoris, unspecified: Secondary | ICD-10-CM

## 2018-02-25 DIAGNOSIS — I255 Ischemic cardiomyopathy: Secondary | ICD-10-CM

## 2018-02-25 DIAGNOSIS — Z9861 Coronary angioplasty status: Secondary | ICD-10-CM

## 2018-02-25 DIAGNOSIS — I2510 Atherosclerotic heart disease of native coronary artery without angina pectoris: Secondary | ICD-10-CM | POA: Diagnosis not present

## 2018-02-25 NOTE — Patient Instructions (Signed)
Medication Instructions:   Your physician recommends that you continue on your current medications as directed. Please refer to the Current Medication list given to you today.  Labwork:  None  Testing/Procedures:  None  Follow-Up:  Your physician recommends that you schedule a follow-up appointment in: 1 month  Any Other Special Instructions Will Be Listed Below (If Applicable).  If you need a refill on your cardiac medications before your next appointment, please call your pharmacy. 

## 2018-02-25 NOTE — Progress Notes (Signed)
Cardiology Office Note:    Date:  02/25/2018   ID:  Joe [name], DOB 11/28/1970, MRN [id number]  PCP:  Cyndi Bender, PA-C  Cardiologist:  Jenne Campus, MD    Referring MD: Cyndi Bender, PA-C   Chief Complaint  Patient presents with  . Follow up Cath  Doing better  History of Present Illness:    Joe [name] is a 47 y.o. male with coronary artery disease.  Few months ago he required stenting to left anterior descending artery he did great after that and then he started having problems again he was sent back to the cardiac Cath Lab this time 2 lesions in his circumflex artery were identified and addressed by PTCA and stenting since that time he is doing much better interestingly day after his been discharged from the hospital he took nitroglycerin first time ever.  With good relief since that time he said he gets short of breath quite easily while moving around when he goes to [place] he walks around he will get shortness of breath.  Sometimes also chest pain.  Overall he feels dramatically better compared to before I seen him last time.  With talking length about risk factors modification he sign up with rehab which is an excellent idea I asked him to go back on ranolazine to see if we can help with his symptoms.  Past Medical History:  Diagnosis Date  . Abnormal EKG   . Body mass index 45.0-49.9, adult ([place])   . Complication of anesthesia 1995   "had hard time waking up; breathing w/vasectomy"  . Coronary artery disease   . Erectile dysfunction   . Essential hypertension, benign   . Fatigue   . GERD (gastroesophageal reflux disease)   . High cholesterol    "just started tx yesterday" (09/06/2017)  . History of gout    "RX prn" (09/06/2017)  . Muscular chest pain   . Obesity   . OSA on CPAP   . Plantar fasciitis   . Right bundle branch block   . Testosterone deficiency   . Type II diabetes mellitus ([place])    "started tx 08/28/2017"    Past Surgical History:    Procedure Laterality Date  . BACK SURGERY    . CORONARY ANGIOPLASTY WITH STENT PLACEMENT  09/06/2017   "3 stents"  . CORONARY STENT INTERVENTION N/A 09/06/2017   Procedure: CORONARY STENT INTERVENTION;  Surgeon: Jettie Booze, MD;  Location: [facility];  Service: Cardiovascular;  Laterality: N/A;  . CORONARY STENT INTERVENTION N/A 02/15/2018   Procedure: CORONARY STENT INTERVENTION;  Surgeon: Jettie Booze, MD;  Location: [facility];  Service: Cardiovascular;  Laterality: N/A;  . KNEE ARTHROSCOPY Right   . LEFT HEART CATH AND CORONARY ANGIOGRAPHY N/A 09/06/2017   Procedure: LEFT HEART CATH AND CORONARY ANGIOGRAPHY;  Surgeon: Jettie Booze, MD;  Location: [facility];  Service: Cardiovascular;  Laterality: N/A;  . LEFT HEART CATH AND CORONARY ANGIOGRAPHY N/A 02/15/2018   Procedure: LEFT HEART CATH AND CORONARY ANGIOGRAPHY;  Surgeon: Jettie Booze, MD;  Location: [facility];  Service: Cardiovascular;  Laterality: N/A;  . LUMBAR SPINE SURGERY  ~ 2001   Intradiscal Electrothermoplasty  . VASECTOMY  1995    Current Medications: Current Meds  Medication Sig  . allopurinol (ZYLOPRIM) 100 MG tablet Take 100 mg by mouth daily as needed (gout).   Marland Kitchen aspirin EC 81 MG tablet Take 1 tablet (81 mg total) by mouth daily.  Marland Kitchen  BRILINTA 90 MG TABS tablet TAKE 1 TABLET BY MOUTH TWICE A DAY  . carvedilol (COREG) 6.25 MG tablet TAKE 1 TABLET (6.25 MG TOTAL) BY MOUTH 2 (TWO) TIMES DAILY WITH A MEAL.  . carvedilol (COREG) 6.25 MG tablet Take 1 tablet (6.25 mg total) by mouth 2 (two) times daily with a meal.  . furosemide (LASIX) 40 MG tablet Take 1 tablet (40 mg total) by mouth daily.  . metFORMIN (GLUCOPHAGE-XR) 500 MG 24 hr tablet Take 500 mg by mouth 3 (three) times daily.   . nitroGLYCERIN (NITROSTAT) 0.4 MG SL tablet Place 1 tablet (0.4 mg total) under the tongue every 5 (five) minutes as needed for chest pain.  Marland Kitchen omeprazole (PRILOSEC) 40 MG capsule  Take 40 mg by mouth daily.  . rosuvastatin (CRESTOR) 40 MG tablet Take 1 tablet (40 mg total) by mouth every evening.  . sacubitril-valsartan (ENTRESTO) 49-51 MG Take 1 tablet by mouth 2 (two) times daily.  Marland Kitchen spironolactone (ALDACTONE) 25 MG tablet Take 0.5 tablets (12.5 mg total) by mouth daily.     Allergies:   Patient has no known allergies.   Social History   Socioeconomic History  . Marital status: Married    Spouse name: Not on file  . Number of children: Not on file  . Years of education: Not on file  . Highest education level: Not on file  Occupational History  . Not on file  Social Needs  . Financial resource strain: Not on file  . Food insecurity:    Worry: Not on file    Inability: Not on file  . Transportation needs:    Medical: Not on file    Non-medical: Not on file  Tobacco Use  . Smoking status: Never Smoker  . Smokeless tobacco: Never Used  Substance and Sexual Activity  . Alcohol use: No    Frequency: Never  . Drug use: Never  . Sexual activity: Not Currently  Lifestyle  . Physical activity:    Days per week: Not on file    Minutes per session: Not on file  . Stress: Not on file  Relationships  . Social connections:    Talks on phone: Not on file    Gets together: Not on file    Attends religious service: Not on file    Active member of club or organization: Not on file    Attends meetings of clubs or organizations: Not on file    Relationship status: Not on file  Other Topics Concern  . Not on file  Social History Narrative  . Not on file     Family History: The patient's family history includes Brain cancer in his mother; Diabetes Mellitus II in his brother and paternal grandfather; Lung cancer in his mother; Lymphoma in his father. ROS:   Please see the history of present illness.    All 14 point review of systems negative except as described per history of present illness  EKGs/Labs/Other Studies Reviewed:      Recent  Labs: 09/07/2017: Hemoglobin 15.2; Platelets 224 09/14/2017: BUN 15; Creatinine, Ser 1.12; Potassium 4.6; Sodium 137  Recent Lipid Panel No results found for: CHOL, TRIG, HDL, CHOLHDL, VLDL, LDLCALC, LDLDIRECT  Physical Exam:    VS:  BP 112/64   Pulse 91   Ht 6\' 3"  (1.905 m)   Wt (!) 372 lb 12.8 oz (169.1 kg)   SpO2 96%   BMI 46.60 kg/m     Wt Readings from Last 3 Encounters:  02/25/18 (!) 372 lb 12.8 oz (169.1 kg)  02/15/18 (!) 360 lb (163.3 kg)  02/08/18 (!) 377 lb (171 kg)     GEN:  Well nourished, well developed in no acute distress HEENT: Normal NECK: No JVD; No carotid bruits LYMPHATICS: No lymphadenopathy CARDIAC: RRR, no murmurs, no rubs, no gallops RESPIRATORY:  Clear to auscultation without rales, wheezing or rhonchi  ABDOMEN: Soft, non-tender, non-distended MUSCULOSKELETAL:  No edema; No deformity  SKIN: Warm and dry LOWER EXTREMITIES: no swelling NEUROLOGIC:  Alert and oriented x 3 PSYCHIATRIC:  Normal affect   ASSESSMENT:    1. CAD S/P percutaneous coronary angioplasty   2. Essential hypertension, benign   3. Ischemic cardiomyopathy   4. Angina pectoris (HCC)   5. Class 3 severe obesity due to excess calories without serious comorbidity in adult, unspecified BMI ([place])    PLAN:    In order of problems listed above:  1. Coronary artery disease status post PTCA and stenting of the circumflex artery done at the end of last month.  We review medications as well as notes from hospital.  I explained to him what was done I stressed importance of needs to take all medication the proper way.  He is going to go and sign up with rehab which is very beneficial. 2. Essential hypertension blood pressure well controlled continue present management. 3. Cardiomyopathy he is on Coreg as well as Entresto which I will continue.  I see him back in my office very quickly within next month and at that time we will try to augment his therapy for cardiomyopathy. 4. Angina pectoris.   We will reinitiate ranolazine 500 mg twice daily. 5. Obesity again he is signing up for rehab which will be very beneficial for him.   Medication Adjustments/Labs and Tests Ordered: Current medicines are reviewed at length with the patient today.  Concerns regarding medicines are outlined above.  No orders of the defined types were placed in this encounter.  Medication changes: No orders of the defined types were placed in this encounter.   Signed, Park Liter, MD, [facility] 02/25/2018 11:06 AM    [place]

## 2018-02-27 ENCOUNTER — Encounter (HOSPITAL_COMMUNITY): Payer: Self-pay

## 2018-02-27 ENCOUNTER — Telehealth (HOSPITAL_COMMUNITY): Payer: Self-pay

## 2018-02-27 ENCOUNTER — Ambulatory Visit (HOSPITAL_COMMUNITY): Payer: BLUE CROSS/BLUE SHIELD

## 2018-02-27 NOTE — Telephone Encounter (Signed)
Attempted to call pt a 2nd time- LM ON VM ° °Mailed letter out °

## 2018-03-01 ENCOUNTER — Ambulatory Visit (HOSPITAL_COMMUNITY): Payer: BLUE CROSS/BLUE SHIELD

## 2018-03-04 ENCOUNTER — Ambulatory Visit (HOSPITAL_COMMUNITY): Payer: BLUE CROSS/BLUE SHIELD

## 2018-03-06 ENCOUNTER — Ambulatory Visit (HOSPITAL_COMMUNITY): Payer: BLUE CROSS/BLUE SHIELD

## 2018-03-11 ENCOUNTER — Telehealth (HOSPITAL_COMMUNITY): Payer: Self-pay

## 2018-03-11 NOTE — Telephone Encounter (Signed)
3rd attempt to contact patient in regards to Cardiac Rehab - lm on vm °

## 2018-03-27 ENCOUNTER — Encounter: Payer: Self-pay | Admitting: Cardiology

## 2018-03-27 ENCOUNTER — Ambulatory Visit (INDEPENDENT_AMBULATORY_CARE_PROVIDER_SITE_OTHER): Payer: BLUE CROSS/BLUE SHIELD | Admitting: Cardiology

## 2018-03-27 VITALS — BP 114/68 | HR 87 | Ht 75.0 in | Wt 379.0 lb

## 2018-03-27 DIAGNOSIS — I2510 Atherosclerotic heart disease of native coronary artery without angina pectoris: Secondary | ICD-10-CM | POA: Diagnosis not present

## 2018-03-27 DIAGNOSIS — E118 Type 2 diabetes mellitus with unspecified complications: Secondary | ICD-10-CM

## 2018-03-27 DIAGNOSIS — Z9861 Coronary angioplasty status: Secondary | ICD-10-CM | POA: Diagnosis not present

## 2018-03-27 DIAGNOSIS — I10 Essential (primary) hypertension: Secondary | ICD-10-CM | POA: Diagnosis not present

## 2018-03-27 DIAGNOSIS — I255 Ischemic cardiomyopathy: Secondary | ICD-10-CM

## 2018-03-27 MED ORDER — CARVEDILOL 12.5 MG PO TABS: 12.5000 mg | ORAL_TABLET | Freq: Two times a day (BID) | ORAL | 3 refills | Status: DC

## 2018-03-27 NOTE — Patient Instructions (Addendum)
Medication Instructions:  Your physician has recommended you make the following change in your medication:   INCREASE:  carvedilol to 12.5 mg twice daily.   If you need a refill on your cardiac medications before your next appointment, please call your pharmacy.   Lab work: Your physician recommends that you return for lab work today: LFT and LIPIDS  If you have labs (blood work) drawn today and your tests are completely normal, you will receive your results only by: Marland Kitchen MyChart Message (if you have MyChart) OR . A paper copy in the mail If you have any lab test that is abnormal or we need to change your treatment, we will call you to review the results.  Testing/Procedures: You had an ekg.   Follow-Up: At [facility], you and your health needs are our priority.  As part of our continuing mission to provide you with exceptional heart care, we have created designated Provider Care Teams.  These Care Teams include your primary Cardiologist (physician) and Advanced Practice Providers (APPs -  Physician Assistants and Nurse Practitioners) who all work together to provide you with the care you need, when you need it. You will need a follow up appointment in 3 months.  Please call our office 2 months in advance to schedule this appointment.  You may see Jenne Campus, MD or another member of our [place] Provider Team in [place]: Shirlee More, MD . Jyl Heinz, MD  Any Other Special Instructions Will Be Listed Below (If Applicable).

## 2018-03-27 NOTE — Progress Notes (Signed)
Cardiology Office Note:    Date:  03/27/2018   ID:  Joe [name], DOB 12/17/1970, MRN [id number]  PCP:  Cyndi Bender, PA-C  Cardiologist:  Jenne Campus, MD    Referring MD: Cyndi Bender, PA-C   Chief Complaint  Patient presents with  . 1 month follow up  Doing well  History of Present Illness:    Joe [name] is a 47 y.o. male with coronary artery disease with multiple stenting.  Overall doing well denies have any chest pain tightness squeezing pressure burning chest.  Shortness of breath with exertion still there but overall seems to be doing better.  She also have cardiomyopathy with ejection fraction 35 to 40% gradually and try to put him on right medications.  Past Medical History:  Diagnosis Date  . Abnormal EKG   . Body mass index 45.0-49.9, adult ([place])   . Complication of anesthesia 1995   "had hard time waking up; breathing w/vasectomy"  . Coronary artery disease   . Erectile dysfunction   . Essential hypertension, benign   . Fatigue   . GERD (gastroesophageal reflux disease)   . High cholesterol    "just started tx yesterday" (09/06/2017)  . History of gout    "RX prn" (09/06/2017)  . Muscular chest pain   . Obesity   . OSA on CPAP   . Plantar fasciitis   . Right bundle branch block   . Testosterone deficiency   . Type II diabetes mellitus ([place])    "started tx 08/28/2017"    Past Surgical History:  Procedure Laterality Date  . BACK SURGERY    . CORONARY ANGIOPLASTY WITH STENT PLACEMENT  09/06/2017   "3 stents"  . CORONARY STENT INTERVENTION N/A 09/06/2017   Procedure: CORONARY STENT INTERVENTION;  Surgeon: Jettie Booze, MD;  Location: [facility];  Service: Cardiovascular;  Laterality: N/A;  . CORONARY STENT INTERVENTION N/A 02/15/2018   Procedure: CORONARY STENT INTERVENTION;  Surgeon: Jettie Booze, MD;  Location: [facility];  Service: Cardiovascular;  Laterality: N/A;  . KNEE ARTHROSCOPY Right   . LEFT HEART CATH  AND CORONARY ANGIOGRAPHY N/A 09/06/2017   Procedure: LEFT HEART CATH AND CORONARY ANGIOGRAPHY;  Surgeon: Jettie Booze, MD;  Location: [facility];  Service: Cardiovascular;  Laterality: N/A;  . LEFT HEART CATH AND CORONARY ANGIOGRAPHY N/A 02/15/2018   Procedure: LEFT HEART CATH AND CORONARY ANGIOGRAPHY;  Surgeon: Jettie Booze, MD;  Location: [facility];  Service: Cardiovascular;  Laterality: N/A;  . LUMBAR SPINE SURGERY  ~ 2001   Intradiscal Electrothermoplasty  . VASECTOMY  1995    Current Medications: Current Meds  Medication Sig  . allopurinol (ZYLOPRIM) 100 MG tablet Take 100 mg by mouth daily as needed (gout).   Marland Kitchen aspirin EC 81 MG tablet Take 1 tablet (81 mg total) by mouth daily.  Marland Kitchen BRILINTA 90 MG TABS tablet TAKE 1 TABLET BY MOUTH TWICE A DAY  . carvedilol (COREG) 6.25 MG tablet TAKE 1 TABLET (6.25 MG TOTAL) BY MOUTH 2 (TWO) TIMES DAILY WITH A MEAL.  . furosemide (LASIX) 40 MG tablet Take 1 tablet (40 mg total) by mouth daily.  . metFORMIN (GLUCOPHAGE-XR) 500 MG 24 hr tablet Take 500 mg by mouth 3 (three) times daily.   . nitroGLYCERIN (NITROSTAT) 0.4 MG SL tablet Place 1 tablet (0.4 mg total) under the tongue every 5 (five) minutes as needed for chest pain.  Marland Kitchen omeprazole (PRILOSEC) 40 MG capsule Take 40 mg  by mouth daily.  . rosuvastatin (CRESTOR) 40 MG tablet Take 1 tablet (40 mg total) by mouth every evening.  . sacubitril-valsartan (ENTRESTO) 49-51 MG Take 1 tablet by mouth 2 (two) times daily.  Marland Kitchen spironolactone (ALDACTONE) 25 MG tablet Take 0.5 tablets (12.5 mg total) by mouth daily.     Allergies:   Patient has no known allergies.   Social History   Socioeconomic History  . Marital status: Married    Spouse name: Not on file  . Number of children: Not on file  . Years of education: Not on file  . Highest education level: Not on file  Occupational History  . Not on file  Social Needs  . Financial resource strain: Not on file  . Food  insecurity:    Worry: Not on file    Inability: Not on file  . Transportation needs:    Medical: Not on file    Non-medical: Not on file  Tobacco Use  . Smoking status: Never Smoker  . Smokeless tobacco: Never Used  Substance and Sexual Activity  . Alcohol use: No    Frequency: Never  . Drug use: Never  . Sexual activity: Not Currently  Lifestyle  . Physical activity:    Days per week: Not on file    Minutes per session: Not on file  . Stress: Not on file  Relationships  . Social connections:    Talks on phone: Not on file    Gets together: Not on file    Attends religious service: Not on file    Active member of club or organization: Not on file    Attends meetings of clubs or organizations: Not on file    Relationship status: Not on file  Other Topics Concern  . Not on file  Social History Narrative  . Not on file     Family History: The patient's family history includes Brain cancer in his mother; Diabetes Mellitus II in his brother and paternal grandfather; Lung cancer in his mother; Lymphoma in his father. ROS:   Please see the history of present illness.    All 14 point review of systems negative except as described per history of present illness  EKGs/Labs/Other Studies Reviewed:      Recent Labs: 09/07/2017: Hemoglobin 15.2; Platelets 224 09/14/2017: BUN 15; Creatinine, Ser 1.12; Potassium 4.6; Sodium 137  Recent Lipid Panel No results found for: CHOL, TRIG, HDL, CHOLHDL, VLDL, LDLCALC, LDLDIRECT  Physical Exam:    VS:  BP 114/68   Pulse 87   Ht 6\' 3"  (1.905 m)   Wt (!) 379 lb (171.9 kg)   SpO2 98%   BMI 47.37 kg/m     Wt Readings from Last 3 Encounters:  03/27/18 (!) 379 lb (171.9 kg)  02/25/18 (!) 372 lb 12.8 oz (169.1 kg)  02/15/18 (!) 360 lb (163.3 kg)     GEN:  Well nourished, well developed in no acute distress HEENT: Normal NECK: No JVD; No carotid bruits LYMPHATICS: No lymphadenopathy CARDIAC: RRR, no murmurs, no rubs, no  gallops RESPIRATORY:  Clear to auscultation without rales, wheezing or rhonchi  ABDOMEN: Soft, non-tender, non-distended MUSCULOSKELETAL:  No edema; No deformity  SKIN: Warm and dry LOWER EXTREMITIES: no swelling NEUROLOGIC:  Alert and oriented x 3 PSYCHIATRIC:  Normal affect   ASSESSMENT:    1. CAD S/P percutaneous coronary angioplasty   2. Ischemic cardiomyopathy   3. Essential hypertension, benign   4. Type 2 diabetes mellitus with complication, without long-term  current use of insulin ([place])    PLAN:    In order of problems listed above:  1. Coronary artery disease stable on appropriate medications I stressed importance of taking dual antiplatelets therapy he is compliant with that. 2. Essential hypertension blood pressure well controlled continue present management. 3. Ischemic cardiomyopathy he is on Entresto, also beta-blocker today I will try to increase dose of beta-blocker EKG will be done if EKG is fine will increase the dose of your echo to 12.5. 4. Dyslipidemia is time to check his fasting lipid profile which we will do today.  Overall he is doing well I see him back in 3 months   Medication Adjustments/Labs and Tests Ordered: Current medicines are reviewed at length with the patient today.  Concerns regarding medicines are outlined above.  No orders of the defined types were placed in this encounter.  Medication changes: No orders of the defined types were placed in this encounter.   Signed, Park Liter, MD, [facility] 03/27/2018 9:27 AM    Joe [name]

## 2018-03-28 LAB — LIPID PANEL
Chol/HDL Ratio: 3.5 ratio (ref 0.0–5.0)
Cholesterol, Total: 124 mg/dL (ref 100–199)
HDL: 35 mg/dL — ABNORMAL LOW (ref >39)
LDL Calculated: 45 mg/dL (ref 0–99)
Triglycerides: 218 mg/dL — ABNORMAL HIGH (ref 0–149)
VLDL Cholesterol Cal: 44 mg/dL — ABNORMAL HIGH (ref 5–40)

## 2018-03-28 LAB — HEPATIC FUNCTION PANEL
ALT: 18 IU/L (ref 0–44)
AST: 15 IU/L (ref 0–40)
Albumin: 4.1 g/dL (ref 3.5–5.5)
Alkaline Phosphatase: 65 IU/L (ref 39–117)
Bilirubin Total: 0.3 mg/dL (ref 0.0–1.2)
Bilirubin, Direct: 0.13 mg/dL (ref 0.00–0.40)
Total Protein: 6.9 g/dL (ref 6.0–8.5)

## 2018-03-29 ENCOUNTER — Telehealth: Payer: Self-pay | Admitting: Emergency Medicine

## 2018-03-29 NOTE — Telephone Encounter (Signed)
Left message for patient to return call regarding lab results.  

## 2018-04-01 ENCOUNTER — Telehealth (HOSPITAL_COMMUNITY): Payer: Self-pay

## 2018-04-01 NOTE — Telephone Encounter (Signed)
Patient returned phone call in regards to Cardiac rehab - scheduled orientation on 06/04/18 at 1:30pm. Patient will attend the 2:45pm exc class. Mailed packet.

## 2018-04-01 NOTE — Telephone Encounter (Signed)
Left message for patient to return call.

## 2018-04-30 ENCOUNTER — Telehealth (HOSPITAL_COMMUNITY): Payer: Self-pay

## 2018-05-13 MED ORDER — TICAGRELOR 90 MG PO TABS: 90.0000 mg | ORAL_TABLET | Freq: Two times a day (BID) | ORAL | 6 refills | Status: DC

## 2018-05-24 ENCOUNTER — Telehealth (HOSPITAL_COMMUNITY): Payer: Self-pay

## 2018-05-31 ENCOUNTER — Telehealth (HOSPITAL_COMMUNITY): Payer: Self-pay | Admitting: Pharmacist

## 2018-05-31 NOTE — Progress Notes (Signed)
Joe [name] 47 y.o. male DOB 09-18-1970 MRN [id number]       Nutrition  1. 02/15/2018 Stented coronary artery    Past Medical History:  Diagnosis Date  . Abnormal EKG   . Body mass index 45.0-49.9, adult ([place])   . Complication of anesthesia 1995   "had hard time waking up; breathing w/vasectomy"  . Coronary artery disease   . Erectile dysfunction   . Essential hypertension, benign   . Fatigue   . GERD (gastroesophageal reflux disease)   . High cholesterol    "just started tx yesterday" (09/06/2017)  . History of gout    "RX prn" (09/06/2017)  . Muscular chest pain   . Obesity   . OSA on CPAP   . Plantar fasciitis   . Right bundle branch block   . Testosterone deficiency   . Type II diabetes mellitus ([place])    "started tx 08/28/2017"   Meds reviewed.    Current Outpatient Medications (Endocrine & Metabolic):  .  metFORMIN (GLUCOPHAGE-XR) 500 MG 24 hr tablet, Take 500 mg by mouth 3 (three) times daily.   Current Outpatient Medications (Cardiovascular):  .  carvedilol (COREG) 12.5 MG tablet, Take 1 tablet (12.5 mg total) by mouth 2 (two) times daily. .  furosemide (LASIX) 40 MG tablet, Take 1 tablet (40 mg total) by mouth daily. .  nitroGLYCERIN (NITROSTAT) 0.4 MG SL tablet, Place 1 tablet (0.4 mg total) under the tongue every 5 (five) minutes as needed for chest pain. .  rosuvastatin (CRESTOR) 40 MG tablet, Take 1 tablet (40 mg total) by mouth every evening. .  sacubitril-valsartan (ENTRESTO) 49-51 MG, Take 1 tablet by mouth 2 (two) times daily. Marland Kitchen  spironolactone (ALDACTONE) 25 MG tablet, Take 0.5 tablets (12.5 mg total) by mouth daily.   Current Outpatient Medications (Analgesics):  .  allopurinol (ZYLOPRIM) 100 MG tablet, Take 100 mg by mouth daily as needed (gout).  Marland Kitchen  aspirin EC 81 MG tablet, Take 1 tablet (81 mg total) by mouth daily.  Current Outpatient Medications (Hematological):  .  ticagrelor (BRILINTA) 90 MG TABS tablet, Take 1 tablet (90 mg total) by mouth 2  (two) times daily.  Current Outpatient Medications (Other):  .  omeprazole (PRILOSEC) 40 MG capsule, Take 40 mg by mouth daily.   HT: Ht Readings from Last 1 Encounters:  03/27/18 6\' 3"  (1.905 m)    WT: Wt Readings from Last 5 Encounters:  03/27/18 (!) 379 lb (171.9 kg)  02/25/18 (!) 372 lb 12.8 oz (169.1 kg)  02/15/18 (!) 360 lb (163.3 kg)  02/08/18 (!) 377 lb (171 kg)  10/16/17 (!) 364 lb 12.8 oz (165.5 kg)     BMI= 47.37  03/27/18  Current tobacco use? No       Labs:  Lipid Panel     Component Value Date/Time   CHOL 124 03/27/2018 0940   TRIG 218 (H) 03/27/2018 0940   HDL 35 (L) 03/27/2018 0940   CHOLHDL 3.5 03/27/2018 0940   LDLCALC 45 03/27/2018 0940    No results found for: HGBA1C CBG (last 3)  No results for input(s): GLUCAP in the last 72 hours.  Nutrition Diagnosis ? Food-and nutrition-related knowledge deficit related to lack of exposure to information as related to diagnosis of: ? CVD ? Type 2 Diabetes ? Obese  III = 40+ related to excessive energy intake as evidenced by a BMI= 47.37  03/27/18  Nutrition Goal(s):  ? To be determined  Plan:  Pt to attend nutrition classes ?  Nutrition I ? Nutrition II ? Portion Distortion  ? Diabetes Blitz ? Diabetes Q & A Will provide client-centered nutrition education as part of interdisciplinary care.   Monitor and evaluate progress toward nutrition goal with team.  Laurina Bustle, MS, RD, LDN 05/31/2018 2:05 PM

## 2018-06-03 NOTE — Telephone Encounter (Signed)
Cardiac Rehab Medication Review by a Pharmacist  Does the patient  feel that his/her medications are working for him/her?  yes  Has the patient been experiencing any side effects to the medications prescribed?  no  Does the patient measure his/her own blood pressure or blood glucose at home?  Not often  Does the patient have any problems obtaining medications due to transportation or finances?   no  Understanding of regimen: good Understanding of indications: good Potential of compliance: good    Pharmacist comments: Patient reports that he does not frequently check blood pressure or blood glucose levels. The last time he reports checking his blood glucose levels was one week ago and was measured at around 150.    Gwenlyn Found, Sherian Rein D PGY1 Pharmacy Resident  Phone [phone] 06/03/2018   11:25 AM

## 2018-06-04 ENCOUNTER — Encounter (HOSPITAL_COMMUNITY)
Admission: RE | Admit: 2018-06-04 | Discharge: 2018-06-04 | Disposition: A | Discharge status: Home / Self Care | Payer: BLUE CROSS/BLUE SHIELD | Source: Ambulatory Visit | Attending: Cardiology | Admitting: Cardiology

## 2018-06-04 ENCOUNTER — Encounter (HOSPITAL_COMMUNITY): Payer: Self-pay

## 2018-06-04 VITALS — BP 118/60 | HR 81 | Ht 75.0 in | Wt 388.2 lb

## 2018-06-04 DIAGNOSIS — Z955 Presence of coronary angioplasty implant and graft: Secondary | ICD-10-CM | POA: Diagnosis not present

## 2018-06-04 LAB — GLUCOSE, CAPILLARY: Glucose-Capillary: 188 mg/dL — ABNORMAL HIGH (ref 70–99)

## 2018-06-04 NOTE — Progress Notes (Addendum)
Cardiac Individual Treatment Plan  Patient Details  Name: Joe [name] MRN: [id number] Date of Birth: 1970-10-07 Referring Provider:   Flowsheet Row CARDIAC REHAB PHASE II ORIENTATION from 06/04/2018 in [place]  Referring Provider  Park Liter, MD      Initial Encounter Date:  [facility] PHASE II ORIENTATION from 06/04/2018 in [place]  Date  06/04/18      Visit Diagnosis: 02/15/2018 Stented coronary artery, S/P DES CFX  Patient's Home Medications on Admission:  Current Outpatient Medications:  .  allopurinol (ZYLOPRIM) 100 MG tablet, Take 100 mg by mouth daily as needed (gout). , Disp: , Rfl:  .  aspirin EC 81 MG tablet, Take 1 tablet (81 mg total) by mouth daily., Disp: 90 tablet, Rfl: 3 .  carvedilol (COREG) 12.5 MG tablet, Take 1 tablet (12.5 mg total) by mouth 2 (two) times daily., Disp: 180 tablet, Rfl: 3 .  furosemide (LASIX) 40 MG tablet, Take 1 tablet (40 mg total) by mouth daily., Disp: 30 tablet, Rfl: 11 .  metFORMIN (GLUCOPHAGE-XR) 500 MG 24 hr tablet, Take 500 mg by mouth 3 (three) times daily. , Disp: , Rfl: 6 .  nitroGLYCERIN (NITROSTAT) 0.4 MG SL tablet, Place 1 tablet (0.4 mg total) under the tongue every 5 (five) minutes as needed for chest pain., Disp: 25 tablet, Rfl: 11 .  omeprazole (PRILOSEC) 40 MG capsule, Take 40 mg by mouth daily., Disp: , Rfl:  .  rosuvastatin (CRESTOR) 40 MG tablet, Take 1 tablet (40 mg total) by mouth every evening., Disp: 90 tablet, Rfl: 3 .  sacubitril-valsartan (ENTRESTO) 49-51 MG, Take 1 tablet by mouth 2 (two) times daily., Disp: 180 tablet, Rfl: 3 .  spironolactone (ALDACTONE) 25 MG tablet, Take 0.5 tablets (12.5 mg total) by mouth daily., Disp: 30 tablet, Rfl: 6 .  ticagrelor (BRILINTA) 90 MG TABS tablet, Take 1 tablet (90 mg total) by mouth 2 (two) times daily., Disp: 60 tablet, Rfl: 6  Past Medical History: Past Medical History:   Diagnosis Date  . Abnormal EKG   . Body mass index 45.0-49.9, adult ([place])   . Complication of anesthesia 1995   "had hard time waking up; breathing w/vasectomy"  . Coronary artery disease   . Erectile dysfunction   . Essential hypertension, benign   . Fatigue   . GERD (gastroesophageal reflux disease)   . High cholesterol    "just started tx yesterday" (09/06/2017)  . History of gout    "RX prn" (09/06/2017)  . Muscular chest pain   . Obesity   . OSA on CPAP   . Plantar fasciitis   . Right bundle branch block   . Testosterone deficiency   . Type II diabetes mellitus ([place])    "started tx 08/28/2017"    Tobacco Use: Social History   Tobacco Use  Smoking Status Never Smoker  Smokeless Tobacco Never Used    Labs: Recent Review Scientist, physiological    Labs for ITP Cardiac and Pulmonary Rehab Latest Ref Rng & Units 03/27/2018   Cholestrol 100 - 199 mg/dL 124   LDLCALC 0 - 99 mg/dL 45   HDL >39 mg/dL 35(L)   Trlycerides 0 - 149 mg/dL 218(H)      Capillary Blood Glucose: Lab Results  Component Value Date   GLUCAP 188 (H) 06/04/2018   GLUCAP 132 (H) 02/15/2018   GLUCAP 164 (H) 02/15/2018   GLUCAP 220 (H) 09/08/2017   GLUCAP 214 (H)  09/07/2017     Exercise Target Goals: Exercise Program Goal: Individual exercise prescription set using results from initial 6 min walk test and THRR while considering  patient's activity barriers and safety.   Exercise Prescription Goal: Initial exercise prescription builds to 30-45 minutes a day of aerobic activity, 2-3 days per week.  Home exercise guidelines will be given to patient during program as part of exercise prescription that the participant will acknowledge.  Activity Barriers & Risk Stratification: Activity Barriers & Cardiac Risk Stratification - 06/04/18 1422    Activity Barriers & Cardiac Risk Stratification          Activity Barriers  Back Problems;Other (comment)    Comments  Occassional back pain. Tingling sensation in  quads with a lot of walking.    Cardiac Risk Stratification  Moderate           6 Minute Walk: 6 Minute Walk    6 Minute Walk    Row Name 06/04/18 1431   Phase  Initial   Distance  1400 feet   Walk Time  6 minutes   # of Rest Breaks  0   MPH  2.65   METS  2.86   RPE  11   Perceived Dyspnea   0   VO2 Peak  10.02   Symptoms  Yes (comment)   Comments  Patient c/o chest tightness, whcih he rated a 1/10 on the pain scale. Chest tightness resolved with rest.   Resting HR  81 bpm   Resting BP  118/60   Resting Oxygen Saturation   98 %   Exercise Oxygen Saturation  during 6 min walk  95 %   Max Ex. HR  104 bpm   Max Ex. BP  118/62   2 Minute Post BP  104/62          Oxygen Initial Assessment:   Oxygen Re-Evaluation:   Oxygen Discharge (Final Oxygen Re-Evaluation):   Initial Exercise Prescription: Initial Exercise Prescription - 06/04/18 1500    Date of Initial Exercise RX and Referring Provider          Date  06/04/18    Referring Provider  Park Liter, MD    Expected Discharge Date  09/09/18        T5 Nustep          Level  3    SPM  85    Minutes  20    METs  2.8        Track          Laps  10    Minutes  10    METs  2.74        Prescription Details          Frequency (times per week)  3    Duration  Progress to 30 minutes of continuous aerobic without signs/symptoms of physical distress        Intensity          THRR 40-80% of Max Heartrate  69-138    Ratings of Perceived Exertion  11-13    Perceived Dyspnea  0-4        Progression          Progression  Continue to progress workloads to maintain intensity without signs/symptoms of physical distress.        Resistance Training          Training Prescription  Yes    Weight  4lbs    Reps  10-15           Perform Capillary Blood Glucose checks as needed.  Exercise Prescription Changes:   Exercise Comments:   Exercise Goals and Review:  Exercise Goals    Exercise  Goals    Row Name 06/04/18 1450   Increase Physical Activity  Yes   Intervention  Provide advice, education, support and counseling about physical activity/exercise needs.;Develop an individualized exercise prescription for aerobic and resistive training based on initial evaluation findings, risk stratification, comorbidities and participant's personal goals.   Expected Outcomes  Short Term: Attend rehab on a regular basis to increase amount of physical activity.;Long Term: Add in home exercise to make exercise part of routine and to increase amount of physical activity.;Long Term: Exercising regularly at least 3-5 days a week.   Increase Strength and Stamina  Yes   Intervention  Develop an individualized exercise prescription for aerobic and resistive training based on initial evaluation findings, risk stratification, comorbidities and participant's personal goals.;Provide advice, education, support and counseling about physical activity/exercise needs.   Expected Outcomes  Short Term: Increase workloads from initial exercise prescription for resistance, speed, and METs.;Short Term: Perform resistance training exercises routinely during rehab and add in resistance training at home;Long Term: Improve cardiorespiratory fitness, muscular endurance and strength as measured by increased METs and functional capacity (6MWT)   Able to understand and use rate of perceived exertion (RPE) scale  Yes   Intervention  Provide education and explanation on how to use RPE scale   Expected Outcomes  Short Term: Able to use RPE daily in rehab to express subjective intensity level;Long Term:  Able to use RPE to guide intensity level when exercising independently   Knowledge and understanding of Target Heart Rate Range (THRR)  Yes   Intervention  Provide education and explanation of THRR including how the numbers were predicted and where they are located for reference   Expected Outcomes  Short Term: Able to state/look up  THRR;Long Term: Able to use THRR to govern intensity when exercising independently;Short Term: Able to use daily as guideline for intensity in rehab   Able to check pulse independently  Yes   Intervention  Provide education and demonstration on how to check pulse in carotid and radial arteries.;Review the importance of being able to check your own pulse for safety during independent exercise   Expected Outcomes  Short Term: Able to explain why pulse checking is important during independent exercise;Long Term: Able to check pulse independently and accurately   Understanding of Exercise Prescription  Yes   Intervention  Provide education, explanation, and written materials on patient's individual exercise prescription   Expected Outcomes  Short Term: Able to explain program exercise prescription;Long Term: Able to explain home exercise prescription to exercise independently          Exercise Goals Re-Evaluation :   Discharge Exercise Prescription (Final Exercise Prescription Changes):   Nutrition:  Target Goals: Understanding of nutrition guidelines, daily intake of sodium 1500mg , cholesterol 200mg , calories 30% from fat and 7% or less from saturated fats, daily to have 5 or more servings of fruits and vegetables.  Biometrics: Pre Biometrics - 06/04/18 1431    Pre Biometrics          Height  6\' 3"  (1.905 m)    Weight  (Abnormal) 176.1 kg    Waist Circumference  58.5 inches    Hip Circumference  53.25 inches    Waist to Hip Ratio  1.1 %  BMI (Calculated)  48.53    Triceps Skinfold  58 mm    % Body Fat  48 %    Grip Strength  45.25 kg    Flexibility  0 in   Unable to straighten legs completely   Single Leg Stand  2.43 seconds            Nutrition Therapy Plan and Nutrition Goals:   Nutrition Assessments:   Nutrition Goals Re-Evaluation:   Nutrition Goals Re-Evaluation:   Nutrition Goals Discharge (Final Nutrition Goals Re-Evaluation):   Psychosocial: Target  Goals: Acknowledge presence or absence of significant depression and/or stress, maximize coping skills, provide positive support system. Participant is able to verbalize types and ability to use techniques and skills needed for reducing stress and depression.  Initial Review & Psychosocial Screening: Initial Psych Review & Screening - 06/04/18 1457    Initial Review          Current issues with  Current Stress Concerns    Source of Stress Concerns  Chronic Illness        Family Dynamics          Good Support System?  Yes   Carman has his wife and children for support       Barriers          Psychosocial barriers to participate in program  The patient should benefit from training in stress management and relaxation.        Screening Interventions          Interventions  Encouraged to exercise;To provide support and resources with identified psychosocial needs;Provide feedback about the scores to participant    Expected Outcomes  Short Term goal: Utilizing psychosocial counselor, staff and physician to assist with identification of specific Stressors or current issues interfering with healing process. Setting desired goal for each stressor or current issue identified.;Long Term Goal: Stressors or current issues are controlled or eliminated.;Short Term goal: Identification and review with participant of any Quality of Life or Depression concerns found by scoring the questionnaire.;Long Term goal: The participant improves quality of Life and PHQ9 Scores as seen by post scores and/or verbalization of changes           Quality of Life Scores: Quality of Life - 06/04/18 1405    Quality of Life          Select  Quality of Life        Quality of Life Scores          Health/Function Pre  16 %    Socioeconomic Pre  24.86 %    Psych/Spiritual Pre  13.71 %    Family Pre  21.6 %    GLOBAL Pre  18.18 %          Scores of 19 and below usually indicate a poorer quality of life in  these areas.  A difference of  2-3 points is a clinically meaningful difference.  A difference of 2-3 points in the total score of the Quality of Life Index has been associated with significant improvement in overall quality of life, self-image, physical symptoms, and general health in studies assessing change in quality of life.  PHQ-9: Recent Review Flowsheet Data    There is no flowsheet data to display.     Interpretation of Total Score  Total Score Depression Severity:  1-4 = Minimal depression, 5-9 = Mild depression, 10-14 = Moderate depression, 15-19 = Moderately severe depression, 20-27 = Severe depression   Psychosocial Evaluation  and Intervention:   Psychosocial Re-Evaluation:   Psychosocial Discharge (Final Psychosocial Re-Evaluation):   Vocational Rehabilitation: Provide vocational rehab assistance to qualifying candidates.   Vocational Rehab Evaluation & Intervention: Vocational Rehab - 06/04/18 1459    Initial Vocational Rehab Evaluation & Intervention          Assessment shows need for Vocational Rehabilitation  No   Wilmar is currently working and does not need vocational rehab at this time          Education: Education Goals: Education classes will be provided on a weekly basis, covering required topics. Participant will state understanding/return demonstration of topics presented.  Learning Barriers/Preferences: Learning Barriers/Preferences - 06/04/18 1458    Learning Barriers/Preferences          Learning Barriers  None    Learning Preferences  Skilled Demonstration;Pictoral;Video           Education Topics: Count Your Pulse:  -Group instruction provided by verbal instruction, demonstration, patient participation and written materials to support subject.  Instructors address importance of being able to find your pulse and how to count your pulse when at home without a heart monitor.  Patients get hands on experience counting their pulse with  staff help and individually.   Heart Attack, Angina, and Risk Factor Modification:  -Group instruction provided by verbal instruction, video, and written materials to support subject.  Instructors address signs and symptoms of angina and heart attacks.    Also discuss risk factors for heart disease and how to make changes to improve heart health risk factors.   Functional Fitness:  -Group instruction provided by verbal instruction, demonstration, patient participation, and written materials to support subject.  Instructors address safety measures for doing things around the house.  Discuss how to get up and down off the floor, how to pick things up properly, how to safely get out of a chair without assistance, and balance training.   Meditation and Mindfulness:  -Group instruction provided by verbal instruction, patient participation, and written materials to support subject.  Instructor addresses importance of mindfulness and meditation practice to help reduce stress and improve awareness.  Instructor also leads participants through a meditation exercise.    Stretching for Flexibility and Mobility:  -Group instruction provided by verbal instruction, patient participation, and written materials to support subject.  Instructors lead participants through series of stretches that are designed to increase flexibility thus improving mobility.  These stretches are additional exercise for major muscle groups that are typically performed during regular warm up and cool down.   Hands Only CPR:  -Group verbal, video, and participation provides a basic overview of AHA guidelines for community CPR. Role-play of emergencies allow participants the opportunity to practice calling for help and chest compression technique with discussion of AED use.   Hypertension: -Group verbal and written instruction that provides a basic overview of hypertension including the most recent diagnostic guidelines, risk factor  reduction with self-care instructions and medication management.    Nutrition I class: Heart Healthy Eating:  -Group instruction provided by PowerPoint slides, verbal discussion, and written materials to support subject matter. The instructor gives an explanation and review of the Therapeutic Lifestyle Changes diet recommendations, which includes a discussion on lipid goals, dietary fat, sodium, fiber, plant stanol/sterol esters, sugar, and the components of a well-balanced, healthy diet.   Nutrition II class: Lifestyle Skills:  -Group instruction provided by PowerPoint slides, verbal discussion, and written materials to support subject matter. The instructor gives an explanation and review  of label reading, grocery shopping for heart health, heart healthy recipe modifications, and ways to make healthier choices when eating out.   Diabetes Question & Answer:  -Group instruction provided by PowerPoint slides, verbal discussion, and written materials to support subject matter. The instructor gives an explanation and review of diabetes co-morbidities, pre- and post-prandial blood glucose goals, pre-exercise blood glucose goals, signs, symptoms, and treatment of hypoglycemia and hyperglycemia, and foot care basics.   Diabetes Blitz:  -Group instruction provided by PowerPoint slides, verbal discussion, and written materials to support subject matter. The instructor gives an explanation and review of the physiology behind type 1 and type 2 diabetes, diabetes medications and rational behind using different medications, pre- and post-prandial blood glucose recommendations and Hemoglobin A1c goals, diabetes diet, and exercise including blood glucose guidelines for exercising safely.    Portion Distortion:  -Group instruction provided by PowerPoint slides, verbal discussion, written materials, and food models to support subject matter. The instructor gives an explanation of serving size versus portion  size, changes in portions sizes over the last 20 years, and what consists of a serving from each food group.   Stress Management:  -Group instruction provided by verbal instruction, video, and written materials to support subject matter.  Instructors review role of stress in heart disease and how to cope with stress positively.     Exercising on Your Own:  -Group instruction provided by verbal instruction, power point, and written materials to support subject.  Instructors discuss benefits of exercise, components of exercise, frequency and intensity of exercise, and end points for exercise.  Also discuss use of nitroglycerin and activating EMS.  Review options of places to exercise outside of rehab.  Review guidelines for sex with heart disease.   Cardiac Drugs I:  -Group instruction provided by verbal instruction and written materials to support subject.  Instructor reviews cardiac drug classes: antiplatelets, anticoagulants, beta blockers, and statins.  Instructor discusses reasons, side effects, and lifestyle considerations for each drug class.   Cardiac Drugs II:  -Group instruction provided by verbal instruction and written materials to support subject.  Instructor reviews cardiac drug classes: angiotensin converting enzyme inhibitors (ACE-I), angiotensin II receptor blockers (ARBs), nitrates, and calcium channel blockers.  Instructor discusses reasons, side effects, and lifestyle considerations for each drug class.   Anatomy and Physiology of the Circulatory System:  Group verbal and written instruction and models provide basic cardiac anatomy and physiology, with the coronary electrical and arterial systems. Review of: AMI, Angina, Valve disease, Heart Failure, Peripheral Artery Disease, Cardiac Arrhythmia, Pacemakers, and the ICD.   Other Education:  -Group or individual verbal, written, or video instructions that support the educational goals of the cardiac rehab  program.   Holiday Eating Survival Tips:  -Group instruction provided by PowerPoint slides, verbal discussion, and written materials to support subject matter. The instructor gives patients tips, tricks, and techniques to help them not only survive but enjoy the holidays despite the onslaught of food that accompanies the holidays.   Knowledge Questionnaire Score: Knowledge Questionnaire Score - 06/04/18 1405    Knowledge Questionnaire Score          Pre Score  19/24           Core Components/Risk Factors/Patient Goals at Admission: Personal Goals and Risk Factors at Admission - 06/04/18 1452    Core Components/Risk Factors/Patient Goals on Admission           Weight Management  Yes;Obesity;Weight Loss    Intervention  Weight  Management/Obesity: Establish reasonable short term and long term weight goals.;Obesity: Provide education and appropriate resources to help participant work on and attain dietary goals.    Admit Weight  388 lb 3.7 oz (176.1 kg)    Goal Weight: Short Term  382 lb (173.3 kg)    Goal Weight: Long Term  288 lb (130.6 kg)    Expected Outcomes  Short Term: Continue to assess and modify interventions until short term weight is achieved;Long Term: Adherence to nutrition and physical activity/exercise program aimed toward attainment of established weight goal;Weight Loss: Understanding of general recommendations for a balanced deficit meal plan, which promotes 1-2 lb weight loss per week and includes a negative energy balance of [phone] kcal/d;Understanding recommendations for meals to include 15-35% energy as protein, 25-35% energy from fat, 35-60% energy from carbohydrates, less than 200mg  of dietary cholesterol, 20-35 gm of total fiber daily;Understanding of distribution of calorie intake throughout the day with the consumption of 4-5 meals/snacks    Diabetes  Yes    Intervention  Provide education about signs/symptoms and action to take for hypo/hyperglycemia.;Provide  education about proper nutrition, including hydration, and aerobic/resistive exercise prescription along with prescribed medications to achieve blood glucose in normal ranges: Fasting glucose 65-99 mg/dL    Expected Outcomes  Long Term: Attainment of HbA1C < 7%.;Short Term: Participant verbalizes understanding of the signs/symptoms and immediate care of hyper/hypoglycemia, proper foot care and importance of medication, aerobic/resistive exercise and nutrition plan for blood glucose control.    Hypertension  Yes    Intervention  Provide education on lifestyle modifcations including regular physical activity/exercise, weight management, moderate sodium restriction and increased consumption of fresh fruit, vegetables, and low fat dairy, alcohol moderation, and smoking cessation.;Monitor prescription use compliance.    Expected Outcomes  Short Term: Continued assessment and intervention until BP is < 140/32mm HG in hypertensive participants. < 130/90mm HG in hypertensive participants with diabetes, heart failure or chronic kidney disease.;Long Term: Maintenance of blood pressure at goal levels.    Lipids  Yes    Intervention  Provide education and support for participant on nutrition & aerobic/resistive exercise along with prescribed medications to achieve LDL 70mg , HDL >40mg .    Expected Outcomes  Short Term: Participant states understanding of desired cholesterol values and is compliant with medications prescribed. Participant is following exercise prescription and nutrition guidelines.;Long Term: Cholesterol controlled with medications as prescribed, with individualized exercise RX and with personalized nutrition plan. Value goals: LDL < 70mg , HDL > 40 mg.    Stress  Yes    Intervention  Offer individual and/or small group education and counseling on adjustment to heart disease, stress management and health-related lifestyle change. Teach and support self-help strategies.;Refer participants experiencing  significant psychosocial distress to appropriate mental health specialists for further evaluation and treatment. When possible, include family members and significant others in education/counseling sessions.    Expected Outcomes  Short Term: Participant demonstrates changes in health-related behavior, relaxation and other stress management skills, ability to obtain effective social support, and compliance with psychotropic medications if prescribed.;Long Term: Emotional wellbeing is indicated by absence of clinically significant psychosocial distress or social isolation.           Core Components/Risk Factors/Patient Goals Review:    Core Components/Risk Factors/Patient Goals at Discharge (Final Review):    ITP Comments: ITP Comments    Row Name 06/04/18 1412   ITP Comments  Medical Director- Dr. Fransico Him, MD      Comments: Patient attended orientation from 1400to [id number]  to review rules and guidelines for program. Completed 6 minute walk test, Intitial ITP, and exercise prescription.  VSS. Telemetry-sinus rhythm, RBBB, frequent PVC bigeminal  couplets at times. Pt c/o chest tightness with walk test, relieved with rest. Dr. Agustin Cree made aware.  Rhythm strips faxed for review. Office visit scheduled for 06/06/2018 @ 4:20pm.  Understanding verbalized.  Andi Hence, RN, BSN Cardiac Pulmonary Rehab

## 2018-06-04 NOTE — Progress Notes (Signed)
OUTPATIENT CARDIAC REHAB  PMH:  02/15/2018 DES Cfx  Primary Cardiologist:  Dr. Agustin Cree   Pt arrived at cardiac rehab orientation with telemetry sinus rhythm, RBBB with frequent PVC, bigeminal at times with couplets,  Pt currently asymptomatic. Pt report approximately 2 week increased dyspnea with ADLs primarily bathing and dressing, occasional fleeting chest pain. Pt also reports dry heaves qAM worse past few days. Otherwise asymptomatic. Pt does not weigh at home.  Weight today 176.1kg (387.42lb), which is up 9lb  from previous office weight in October. PC to Dr. Wendy Poet office to report symptoms and frequent arrhythmia. Dr. Agustin Cree recommends proceeding with 6 minute walk test.  Pt reported mild chest tightness and dyspnea with walking, relieved with rest. Ectopy decreased.  Work in office visit scheduled for Thursday 06/06/2018 at 4:20.  Pt given written and verbal instructions. Pt also instructed in proper NTG use including when to call 911. Lastly pt instructed to obtain weight for home use. Understanding verbalized.  Andi Hence, RN, BSN, Cardiac Pulmonary Rehab

## 2018-06-05 NOTE — Progress Notes (Signed)
Joe [name] 47 y.o. male DOB: 06-06-1971 MRN: [id number]      Nutrition Note  1. 02/15/2018 Stented coronary artery, S/P DES CFX    Past Medical History:  Diagnosis Date  . Abnormal EKG   . Body mass index 45.0-49.9, adult ([place])   . Complication of anesthesia 1995   "had hard time waking up; breathing w/vasectomy"  . Coronary artery disease   . Erectile dysfunction   . Essential hypertension, benign   . Fatigue   . GERD (gastroesophageal reflux disease)   . High cholesterol    "just started tx yesterday" (09/06/2017)  . History of gout    "RX prn" (09/06/2017)  . Muscular chest pain   . Obesity   . OSA on CPAP   . Plantar fasciitis   . Right bundle branch block   . Testosterone deficiency   . Type II diabetes mellitus ([place])    "started tx 08/28/2017"   Meds reviewed.   Current Outpatient Medications (Endocrine & Metabolic):  .  metFORMIN (GLUCOPHAGE-XR) 500 MG 24 hr tablet, Take 500 mg by mouth 3 (three) times daily.   Current Outpatient Medications (Cardiovascular):  .  carvedilol (COREG) 12.5 MG tablet, Take 1 tablet (12.5 mg total) by mouth 2 (two) times daily. .  furosemide (LASIX) 40 MG tablet, Take 1 tablet (40 mg total) by mouth daily. .  nitroGLYCERIN (NITROSTAT) 0.4 MG SL tablet, Place 1 tablet (0.4 mg total) under the tongue every 5 (five) minutes as needed for chest pain. .  rosuvastatin (CRESTOR) 40 MG tablet, Take 1 tablet (40 mg total) by mouth every evening. .  sacubitril-valsartan (ENTRESTO) 49-51 MG, Take 1 tablet by mouth 2 (two) times daily. Marland Kitchen  spironolactone (ALDACTONE) 25 MG tablet, Take 0.5 tablets (12.5 mg total) by mouth daily.   Current Outpatient Medications (Analgesics):  .  allopurinol (ZYLOPRIM) 100 MG tablet, Take 100 mg by mouth daily as needed (gout).  Marland Kitchen  aspirin EC 81 MG tablet, Take 1 tablet (81 mg total) by mouth daily.  Current Outpatient Medications (Hematological):  .  ticagrelor (BRILINTA) 90 MG TABS tablet, Take 1 tablet (90 mg  total) by mouth 2 (two) times daily.  Current Outpatient Medications (Other):  .  omeprazole (PRILOSEC) 40 MG capsule, Take 40 mg by mouth daily.   HT: Ht Readings from Last 1 Encounters:  06/04/18 6\' 3"  (1.905 m)    WT: Wt Readings from Last 5 Encounters:  06/04/18 (!) 388 lb 3.7 oz (176.1 kg)  03/27/18 (!) 379 lb (171.9 kg)  02/25/18 (!) 372 lb 12.8 oz (169.1 kg)  02/15/18 (!) 360 lb (163.3 kg)  02/08/18 (!) 377 lb (171 kg)     Body mass index is 48.53 kg/m.   Current tobacco use? No  Labs:  Lipid Panel     Component Value Date/Time   CHOL 124 03/27/2018 0940   TRIG 218 (H) 03/27/2018 0940   HDL 35 (L) 03/27/2018 0940   CHOLHDL 3.5 03/27/2018 0940   LDLCALC 45 03/27/2018 0940    No results found for: HGBA1C CBG (last 3)  Recent Labs    06/04/18 1414  GLUCAP 188*    Nutrition Note Spoke with pt. Nutrition plan and goals reviewed with pt. Pt is following Step 1 of the Therapeutic Lifestyle Changes diet. Pt wants to lose wt and eat to manage his heart disease and diabetes. Pt shared he had not been educated on eating to manage diabetes or heart disease. Pt expressed willingness to learn and  make changes. Heart healthy diabetic eating tips reviewed (label reading, how to build a healthy plate, portion sizes, eating frequently across the day). Taught pt the difference between simple and complex carbohydrates. Recommended pt replace simple carbs with complex carbs in his diet. Pt had not been taught how to count carbohydrates in his diet, along with label reading taught pt how to calculate carbohydrate servings. Distributed carb counting sheet and diabetic exchanges for carbohydrates. Discussed with pt how to balance eating carbohydrates across the day to manage blood glucose levels. Pt shared he typically eats 1-2 meals a day. Recommended pt eat a minimum of 3x a day to manage blood glucose levels. Discussed how to accomplish this with batch cooking, and meal prep and planning.  Pt has Type 2 Diabetes. Last A1c indicates blood glucose not optimally controlled. This Probation officer went over Diabetes Education test results. Pt checks CBG's 2 times a day. Fasting CBG's reportedly 150's-160's mg/dL. Reviewed the benefits of checking CBG's before and after meals with patient, as well as the benefits of keeping a log of CBG's. Pt was resistant tot he idea of keeping a log, will revisit topic at a later date. Per discussion, pt does not use canned/convenience foods often. Pt does not add salt to food. Pt does eat out frequently, pt shared that he is always on call. Discussed using batch cooking and meal prep, as well as heart healthy snacks to help minimize meals eating away from home. Pt expressed understanding of the information reviewed. Pt aware of nutrition education classes offered and would like to attend nutrition classes.  Nutrition Diagnosis ? Food-and nutrition-related knowledge deficit related to lack of exposure to information as related to diagnosis of: ? CVD ? Type 2 Diabetes ? Obese  III = 40+ related to excessive energy intake as evidenced by a Body mass index is 48.53 kg/m.  Nutrition Intervention ? Pt's individual nutrition plan and goals reviewed with pt. ? Pt given handouts for: ? Nutrition I class ? Nutrition II class  ? Diabetes Blitz Class ? Diabetes Q & A class ? low sodium ? DM   Nutrition Goal(s):  ? Pt to identify and limit food sources of saturated fat, trans fat, refined carbohydrates and sodium ? Pt to identify food quantities necessary to achieve weight loss of 6-24 lbs. at graduation from cardiac rehab ? Pt to eat more frequently across the day ? Pt able to name foods that affect blood glucose  Plan:  ? Pt to attend nutrition classes ? Nutrition I ? Nutrition II ? Portion Distortion  ? Diabetes Blitz ? Diabetes Q & Ae determined ? Will provide client-centered nutrition education as part of interdisciplinary care ? Monitor and evaluate progress toward  nutrition goal with team.  Laurina Bustle, MS, RD, LDN 06/05/2018 8:52 AM

## 2018-06-06 ENCOUNTER — Ambulatory Visit (INDEPENDENT_AMBULATORY_CARE_PROVIDER_SITE_OTHER): Payer: BLUE CROSS/BLUE SHIELD | Admitting: Cardiology

## 2018-06-06 ENCOUNTER — Encounter: Payer: Self-pay | Admitting: Cardiology

## 2018-06-06 VITALS — BP 122/60 | HR 89 | Ht 75.0 in | Wt 387.0 lb

## 2018-06-06 DIAGNOSIS — I255 Ischemic cardiomyopathy: Secondary | ICD-10-CM

## 2018-06-06 DIAGNOSIS — R079 Chest pain, unspecified: Secondary | ICD-10-CM | POA: Diagnosis not present

## 2018-06-06 DIAGNOSIS — I10 Essential (primary) hypertension: Secondary | ICD-10-CM

## 2018-06-06 DIAGNOSIS — R9431 Abnormal electrocardiogram [ECG] [EKG]: Secondary | ICD-10-CM

## 2018-06-06 NOTE — Patient Instructions (Signed)
Medication Instructions:  Your physician recommends that you continue on your current medications as directed. Please refer to the Current Medication list given to you today.  If you need a refill on your cardiac medications before your next appointment, please call your pharmacy.   Lab work: None.  If you have labs (blood work) drawn today and your tests are completely normal, you will receive your results only by: Marland Kitchen MyChart Message (if you have MyChart) OR . A paper copy in the mail If you have any lab test that is abnormal or we need to change your treatment, we will call you to review the results.  Testing/Procedures: Your physician has recommended that you wear a holter monitor. Holter monitors are medical devices that record the heart's electrical activity. Doctors most often use these monitors to diagnose arrhythmias. Arrhythmias are problems with the speed or rhythm of the heartbeat. The monitor is a small, portable device. You can wear one while you do your normal daily activities. This is usually used to diagnose what is causing palpitations/syncope (passing out). Wear for 48 hours   Your physician has requested that you have a lexiscan myoview. For further information please visit [email or web address]. Please follow instruction sheet, as given.      Follow-Up: At [facility], you and your health needs are our priority.  As part of our continuing mission to provide you with exceptional heart care, we have created designated Provider Care Teams.  These Care Teams include your primary Cardiologist (physician) and Advanced Practice Providers (APPs -  Physician Assistants and Nurse Practitioners) who all work together to provide you with the care you need, when you need it. You will need a follow up appointment in 1 months.  Please call our office 2 months in advance to schedule this appointment.  You may see Jenne Campus, MD or another member of our [place] Provider Team  in [place]: Shirlee More, MD . Jyl Heinz, MD  Any Other Special Instructions Will Be Listed Below (If Applicable).   Holter Monitoring A Holter monitor is a small device that is used to detect abnormal heart rhythms. It clips to your clothing and is connected by wires to flat, sticky disks (electrodes) that attach to your chest. It is worn continuously for 24-48 hours. Follow these instructions at home:  Wear your Holter monitor at all times, even while exercising and sleeping, for as long as directed by your health care provider.  Make sure that the Holter monitor is safely clipped to your clothing or close to your body as recommended by your health care provider.  Do not get the monitor or wires wet.  Do not put body lotion or moisturizer on your chest.  Keep your skin clean.  Keep a diary of your daily activities, such as walking and doing chores. If you feel that your heartbeat is abnormal or that your heart is fluttering or skipping a beat: ? Record what you are doing when it happens. ? Record what time of day the symptoms occur.  Return your Holter monitor as directed by your health care provider.  Keep all follow-up visits as directed by your health care provider. This is important. Get help right away if:  You feel lightheaded or you faint.  You have trouble breathing.  You feel pain in your chest, upper arm, or jaw.  You feel sick to your stomach and your skin is pale, cool, or damp.  You heartbeat feels unusual or abnormal. This information  is not intended to replace advice given to you by your health care provider. Make sure you discuss any questions you have with your health care provider. Document Released: 03/03/2004 Document Revised: 11/11/2015 Document Reviewed: 01/12/2014 Elsevier Interactive Patient Education  2018 [employer].    Cardiac Nuclear Scan A cardiac nuclear scan is a test that measures blood flow to the heart when a person is  resting and when he or she is exercising. The test looks for problems such as:  Not enough blood reaching a portion of the heart.  The heart muscle not working normally. You may need this test if:  You have heart disease.  You have had abnormal lab results.  You have had heart surgery or a balloon procedure to open up blocked arteries (angioplasty).  You have chest pain.  You have shortness of breath. In this test, a radioactive dye (tracer) is injected into your bloodstream. After the tracer has traveled to your heart, an imaging device is used to measure how much of the tracer is absorbed by or distributed to various areas of your heart. This procedure is usually done at a hospital and takes 2-4 hours. Tell a health care provider about:  Any allergies you have.  All medicines you are taking, including vitamins, herbs, eye drops, creams, and over-the-counter medicines.  Any problems you or family members have had with anesthetic medicines.  Any blood disorders you have.  Any surgeries you have had.  Any medical conditions you have.  Whether you are pregnant or may be pregnant. What are the risks? Generally, this is a safe procedure. However, problems may occur, including:  Serious chest pain and heart attack. This is only a risk if the stress portion of the test is done.  Rapid heartbeat.  Sensation of warmth in your chest. This usually passes quickly.  Allergic reaction to the tracer. What happens before the procedure?  Ask your health care provider about changing or stopping your regular medicines. This is especially important if you are taking diabetes medicines or blood thinners.  Follow instructions from your health care provider about eating or drinking restrictions.  Remove your jewelry on the day of the procedure. What happens during the procedure?  An IV will be inserted into one of your veins.  Your health care provider will inject a small amount of  radioactive tracer through the IV.  You will wait for 20-40 minutes while the tracer travels through your bloodstream.  Your heart activity will be monitored with an electrocardiogram (ECG).  You will lie down on an exam table.  Images of your heart will be taken for about 15-20 minutes.  You may also have a stress test. For this test, one of the following may be done: ? You will exercise on a treadmill or stationary bike. While you exercise, your heart's activity will be monitored with an ECG, and your blood pressure will be checked. ? You will be given medicines that will increase blood flow to parts of your heart. This is done if you are unable to exercise.  When blood flow to your heart has peaked, a tracer will again be injected through the IV.  After 20-40 minutes, you will get back on the exam table and have more images taken of your heart.  Depending on the type of tracer used, scans may need to be repeated 3-4 hours later.  Your IV line will be removed when the procedure is over. The procedure may vary among health  care providers and hospitals. What happens after the procedure?  Unless your health care provider tells you otherwise, you may return to your normal schedule, including diet, activities, and medicines.  Unless your health care provider tells you otherwise, you may increase your fluid intake. This will help to flush the contrast dye from your body. Drink enough fluid to keep your urine pale yellow.  Ask your health care provider, or the department that is doing the test: ? When will my results be ready? ? How will I get my results? Summary  A cardiac nuclear scan measures the blood flow to the heart when a person is resting and when he or she is exercising.  Tell your health care provider if you are pregnant.  Before the procedure, ask your health care provider about changing or stopping your regular medicines. This is especially important if you are taking  diabetes medicines or blood thinners.  After the procedure, unless your health care provider tells you otherwise, increase your fluid intake. This will help flush the contrast dye from your body.  After the procedure, unless your health care provider tells you otherwise, you may return to your normal schedule, including diet, activities, and medicines. This information is not intended to replace advice given to you by your health care provider. Make sure you discuss any questions you have with your health care provider. Document Released: 06/30/2004 Document Revised: 11/19/2017 Document Reviewed: 11/19/2017 Elsevier Interactive Patient Education  2019 [employer].

## 2018-06-06 NOTE — Progress Notes (Signed)
Cardiology Office Note:    Date:  06/06/2018   ID:  Joe [name], DOB [birth year], MRN [id number]  PCP:  Cyndi Bender, PA-C  Cardiologist:  Jenne Campus, MD    Referring MD: Cyndi Bender, PA-C   Chief Complaint  Patient presents with  . Chest Pain  . Shortness of Breath  Not feeling well  History of Present Illness:    Joe [name] is a 47 y.o. male with complex cardiac history.  He started rehab recently however he cannot perform well does have a lot of extrasystole.  Also complained of having shortness of breath and some atypical chest pain.  Recently went for a cruise had a good time but could not tolerate he could not walk much.  Past Medical History:  Diagnosis Date  . Abnormal EKG   . Body mass index 45.0-49.9, adult ([place])   . Complication of anesthesia 1995   "had hard time waking up; breathing w/vasectomy"  . Coronary artery disease   . Erectile dysfunction   . Essential hypertension, benign   . Fatigue   . GERD (gastroesophageal reflux disease)   . High cholesterol    "just started tx yesterday" (09/06/2017)  . History of gout    "RX prn" (09/06/2017)  . Muscular chest pain   . Obesity   . OSA on CPAP   . Plantar fasciitis   . Right bundle branch block   . Testosterone deficiency   . Type II diabetes mellitus ([place])    "started tx 08/28/2017"    Past Surgical History:  Procedure Laterality Date  . BACK SURGERY    . CORONARY ANGIOPLASTY WITH STENT PLACEMENT  09/06/2017   "3 stents"  . CORONARY STENT INTERVENTION N/A 09/06/2017   Procedure: CORONARY STENT INTERVENTION;  Surgeon: Jettie Booze, MD;  Location: [facility];  Service: Cardiovascular;  Laterality: N/A;  . CORONARY STENT INTERVENTION N/A 02/15/2018   Procedure: CORONARY STENT INTERVENTION;  Surgeon: Jettie Booze, MD;  Location: [facility];  Service: Cardiovascular;  Laterality: N/A;  . KNEE ARTHROSCOPY Right   . LEFT HEART CATH AND CORONARY ANGIOGRAPHY N/A  09/06/2017   Procedure: LEFT HEART CATH AND CORONARY ANGIOGRAPHY;  Surgeon: Jettie Booze, MD;  Location: [facility];  Service: Cardiovascular;  Laterality: N/A;  . LEFT HEART CATH AND CORONARY ANGIOGRAPHY N/A 02/15/2018   Procedure: LEFT HEART CATH AND CORONARY ANGIOGRAPHY;  Surgeon: Jettie Booze, MD;  Location: [facility];  Service: Cardiovascular;  Laterality: N/A;  . LUMBAR SPINE SURGERY  ~ 2001   Intradiscal Electrothermoplasty  . VASECTOMY  1995    Current Medications: Current Meds  Medication Sig  . allopurinol (ZYLOPRIM) 100 MG tablet Take 100 mg by mouth daily as needed (gout).   Marland Kitchen aspirin EC 81 MG tablet Take 1 tablet (81 mg total) by mouth daily.  . carvedilol (COREG) 12.5 MG tablet Take 1 tablet (12.5 mg total) by mouth 2 (two) times daily.  . furosemide (LASIX) 40 MG tablet Take 1 tablet (40 mg total) by mouth daily.  . metFORMIN (GLUCOPHAGE-XR) 500 MG 24 hr tablet Take 500 mg by mouth 3 (three) times daily.   . nitroGLYCERIN (NITROSTAT) 0.4 MG SL tablet Place 1 tablet (0.4 mg total) under the tongue every 5 (five) minutes as needed for chest pain.  Marland Kitchen omeprazole (PRILOSEC) 40 MG capsule Take 40 mg by mouth daily.  . rosuvastatin (CRESTOR) 40 MG tablet Take 1 tablet (40 mg total) by mouth  every evening.  . sacubitril-valsartan (ENTRESTO) 49-51 MG Take 1 tablet by mouth 2 (two) times daily.  Marland Kitchen spironolactone (ALDACTONE) 25 MG tablet Take 0.5 tablets (12.5 mg total) by mouth daily.  . ticagrelor (BRILINTA) 90 MG TABS tablet Take 1 tablet (90 mg total) by mouth 2 (two) times daily.     Allergies:   Patient has no known allergies.   Social History   Socioeconomic History  . Marital status: Married    Spouse name: Not on file  . Number of children: Not on file  . Years of education: Not on file  . Highest education level: Not on file  Occupational History  . Not on file  Social Needs  . Financial resource strain: Not on file  . Food  insecurity:    Worry: Not on file    Inability: Not on file  . Transportation needs:    Medical: Not on file    Non-medical: Not on file  Tobacco Use  . Smoking status: Never Smoker  . Smokeless tobacco: Never Used  Substance and Sexual Activity  . Alcohol use: No    Frequency: Never  . Drug use: Never  . Sexual activity: Not Currently  Lifestyle  . Physical activity:    Days per week: Not on file    Minutes per session: Not on file  . Stress: Not on file  Relationships  . Social connections:    Talks on phone: Not on file    Gets together: Not on file    Attends religious service: Not on file    Active member of club or organization: Not on file    Attends meetings of clubs or organizations: Not on file    Relationship status: Not on file  Other Topics Concern  . Not on file  Social History Narrative  . Not on file     Family History: The patient's family history includes Brain cancer in his mother; Diabetes Mellitus II in his brother and paternal grandfather; Lung cancer in his mother; Lymphoma in his father. ROS:   Please see the history of present illness.    All 14 point review of systems negative except as described per history of present illness  EKGs/Labs/Other Studies Reviewed:      Recent Labs: 09/07/2017: Hemoglobin 15.2; Platelets 224 09/14/2017: BUN 15; Creatinine, Ser 1.12; Potassium 4.6; Sodium 137 03/27/2018: ALT 18  Recent Lipid Panel    Component Value Date/Time   CHOL 124 03/27/2018 0940   TRIG 218 (H) 03/27/2018 0940   HDL 35 (L) 03/27/2018 0940   CHOLHDL 3.5 03/27/2018 0940   LDLCALC 45 03/27/2018 0940    Physical Exam:    VS:  BP 122/60   Pulse 89   Ht 6\' 3"  (1.905 m)   Wt (!) 387 lb (175.5 kg)   SpO2 96%   BMI 48.37 kg/m     Wt Readings from Last 3 Encounters:  06/06/18 (!) 387 lb (175.5 kg)  06/04/18 (!) 388 lb 3.7 oz (176.1 kg)  03/27/18 (!) 379 lb (171.9 kg)     GEN:  Well nourished, well developed in no acute  distress HEENT: Normal NECK: No JVD; No carotid bruits LYMPHATICS: No lymphadenopathy CARDIAC: RRR, no murmurs, no rubs, no gallops RESPIRATORY:  Clear to auscultation without rales, wheezing or rhonchi  ABDOMEN: Soft, non-tender, non-distended MUSCULOSKELETAL:  No edema; No deformity  SKIN: Warm and dry LOWER EXTREMITIES: no swelling NEUROLOGIC:  Alert and oriented x 3 PSYCHIATRIC:  Normal affect  ASSESSMENT:    1. Essential hypertension, benign   2. Abnormal EKG   3. Ischemic cardiomyopathy   4. Chest pain, unspecified type    PLAN:    In order of problems listed above:  1. Ischemic cardiomyopathy on appropriate medication that I will continue however he still having some new worrisome symptoms he will wear 48 hours Holter monitor.  I will schedule him to have 2 days protocol stress test.  In the meantime continue present management. 2. Essential hypertension blood pressure well controlled.   Medication Adjustments/Labs and Tests Ordered: Current medicines are reviewed at length with the patient today.  Concerns regarding medicines are outlined above.  Orders Placed This Encounter  Procedures  . MYOCARDIAL PERFUSION IMAGING  . Holter monitor - 48 hour  . EKG 12-Lead   Medication changes: No orders of the defined types were placed in this encounter.   Signed, Park Liter, MD, [facility] 06/06/2018 5:00 PM    [place]

## 2018-06-07 ENCOUNTER — Telehealth (HOSPITAL_COMMUNITY): Payer: Self-pay | Admitting: *Deleted

## 2018-06-10 ENCOUNTER — Ambulatory Visit (HOSPITAL_COMMUNITY): Payer: BLUE CROSS/BLUE SHIELD

## 2018-06-11 ENCOUNTER — Ambulatory Visit (INDEPENDENT_AMBULATORY_CARE_PROVIDER_SITE_OTHER): Payer: BLUE CROSS/BLUE SHIELD

## 2018-06-11 ENCOUNTER — Other Ambulatory Visit: Payer: Self-pay | Admitting: Cardiology

## 2018-06-11 DIAGNOSIS — I4949 Other premature depolarization: Secondary | ICD-10-CM

## 2018-06-11 DIAGNOSIS — R079 Chest pain, unspecified: Secondary | ICD-10-CM

## 2018-06-11 DIAGNOSIS — R9431 Abnormal electrocardiogram [ECG] [EKG]: Secondary | ICD-10-CM

## 2018-06-11 DIAGNOSIS — R002 Palpitations: Secondary | ICD-10-CM

## 2018-06-11 DIAGNOSIS — I10 Essential (primary) hypertension: Secondary | ICD-10-CM

## 2018-06-11 DIAGNOSIS — I255 Ischemic cardiomyopathy: Secondary | ICD-10-CM

## 2018-06-11 NOTE — Addendum Note (Signed)
Addended by: Jerl Santos R on: 06/11/2018 08:20 AM   Modules accepted: Orders

## 2018-06-14 ENCOUNTER — Telehealth: Payer: Self-pay | Admitting: Cardiology

## 2018-06-14 ENCOUNTER — Ambulatory Visit (HOSPITAL_COMMUNITY): Payer: BLUE CROSS/BLUE SHIELD

## 2018-06-14 DIAGNOSIS — R079 Chest pain, unspecified: Secondary | ICD-10-CM

## 2018-06-14 NOTE — Telephone Encounter (Signed)
Patient did the second half of lexiscan this morning at [facility]. Please call with results when received.

## 2018-06-14 NOTE — Telephone Encounter (Signed)
Results are not back will call patient when they are.

## 2018-06-17 ENCOUNTER — Ambulatory Visit (HOSPITAL_COMMUNITY): Payer: BLUE CROSS/BLUE SHIELD

## 2018-06-17 ENCOUNTER — Telehealth: Payer: Self-pay | Admitting: Cardiology

## 2018-06-17 NOTE — Telephone Encounter (Signed)
Per Dr. Agustin Cree patient informed that a office visit is needed to discuss results. Patient verbally understands. Patient is scheduled and aware

## 2018-06-21 ENCOUNTER — Ambulatory Visit (HOSPITAL_COMMUNITY): Payer: BLUE CROSS/BLUE SHIELD

## 2018-06-24 ENCOUNTER — Ambulatory Visit (HOSPITAL_COMMUNITY): Payer: BLUE CROSS/BLUE SHIELD

## 2018-06-25 ENCOUNTER — Ambulatory Visit: Payer: BLUE CROSS/BLUE SHIELD | Admitting: Cardiology

## 2018-06-25 ENCOUNTER — Encounter: Payer: Self-pay | Admitting: Cardiology

## 2018-06-25 VITALS — BP 100/70 | HR 56 | Wt 380.6 lb

## 2018-06-25 DIAGNOSIS — R9431 Abnormal electrocardiogram [ECG] [EKG]: Secondary | ICD-10-CM

## 2018-06-25 DIAGNOSIS — I255 Ischemic cardiomyopathy: Secondary | ICD-10-CM

## 2018-06-25 DIAGNOSIS — I2510 Atherosclerotic heart disease of native coronary artery without angina pectoris: Secondary | ICD-10-CM

## 2018-06-25 DIAGNOSIS — R0602 Shortness of breath: Secondary | ICD-10-CM

## 2018-06-25 DIAGNOSIS — I209 Angina pectoris, unspecified: Secondary | ICD-10-CM

## 2018-06-25 DIAGNOSIS — Z01818 Encounter for other preprocedural examination: Secondary | ICD-10-CM

## 2018-06-25 DIAGNOSIS — Z9861 Coronary angioplasty status: Secondary | ICD-10-CM

## 2018-06-25 NOTE — Patient Instructions (Addendum)
Medication Instructions:   Your physician recommends that you continue on your current medications as directed. Please refer to the Current Medication list given to you today.  If you need a refill on your cardiac medications before your next appointment, please call your pharmacy.   Lab work: Your physician recommends that you return for lab work today: CBC, BMP  If you have labs (blood work) drawn today and your tests are completely normal, you will receive your results only by: Marland Kitchen MyChart Message (if you have MyChart) OR . A paper copy in the mail If you have any lab test that is abnormal or we need to change your treatment, we will call you to review the results.  Testing/Procedures:   A chest x-ray takes a picture of the organs and structures inside the chest, including the heart, lungs, and blood vessels. This test can show several things, including, whether the heart is enlarges; whether fluid is building up in the lungs; and whether pacemaker / defibrillator leads are still in place.     [place] [id number] Dept: [phone] Loc: [place]  06/25/2018  You are scheduled for a Cardiac Catheterization on Thursday, January 9 with Dr. Larae Grooms.  1. Please arrive at the [facility] (Main Entrance A) at [facility]: [address] [place], [place] [address] at 10:00 AM (This time is two hours before your procedure to ensure your preparation). Free valet parking service is available.   Special note: Every effort is made to have your procedure done on time. Please understand that emergencies sometimes delay scheduled procedures.  2. Diet: Do not eat solid foods after midnight.  The patient may have clear liquids until 5am upon the day of the procedure.  3. Labs: NONE Needed   4. Medication instructions in preparation for your  procedure:   Contrast Allergy: No    Stop taking, Lasix (Furosemide)  Thursday, January 9, And hold Spirolactone    Do not take Diabetes Med Glucophage (Metformin) on the day of the procedure and HOLD 48 HOURS AFTER THE PROCEDURE.  On the morning of your procedure, take your Brilinta/Ticagrelor aspirin, and any morning medicines NOT listed above.  You may use sips of water.  5. Plan for one night stay--bring personal belongings. 6. Bring a current list of your medications and current insurance cards. 7. You MUST have a responsible person to drive you home. 8. Someone MUST be with you the first 24 hours after you arrive home or your discharge will be delayed. 9. Please wear clothes that are easy to get on and off and wear slip-on shoes.  Thank you for allowing [place] to care for you!   -- [place] [facility]   Follow-Up: At [facility], you and your health needs are our priority.  As part of our continuing mission to provide you with exceptional heart care, we have created designated Provider Care Teams.  These Care Teams include your primary Cardiologist (physician) and Advanced Practice Providers (APPs -  Physician Assistants and Nurse Practitioners) who all work together to provide you with the care you need, when you need it. . You will need a follow up appointment in 1 months.        Coronary Angiogram With Stent Coronary angiogram with stent placement is a procedure to widen or open a narrow blood vessel of the heart (coronary artery). Arteries  may become blocked by cholesterol buildup (plaques) in the lining of the wall. When a coronary artery becomes partially blocked, blood flow to that area decreases. This may lead to chest pain or a heart attack (myocardial infarction). A stent is a small piece of metal that looks like mesh or a spring. Stent placement may be done as treatment for a heart attack or right after a coronary angiogram in which a blocked  artery is found. Let your health care provider know about:  Any allergies you have.  All medicines you are taking, including vitamins, herbs, eye drops, creams, and over-the-counter medicines.  Any problems you or family members have had with anesthetic medicines.  Any blood disorders you have.  Any surgeries you have had.  Any medical conditions you have.  Whether you are pregnant or may be pregnant. What are the risks? Generally, this is a safe procedure. However, problems may occur, including:  Damage to the heart or its blood vessels.  A return of blockage.  Bleeding, infection, or bruising at the insertion site.  A collection of blood under the skin (hematoma) at the insertion site.  A blood clot in another part of the body.  Kidney injury.  Allergic reaction to the dye or contrast that is used.  Bleeding into the abdomen (retroperitoneal bleeding). What happens before the procedure? Staying hydrated Follow instructions from your health care provider about hydration, which may include:  Up to 2 hours before the procedure - you may continue to drink clear liquids, such as water, clear fruit juice, black coffee, and plain tea.  Eating and drinking restrictions Follow instructions from your health care provider about eating and drinking, which may include:  8 hours before the procedure - stop eating heavy meals or foods such as meat, fried foods, or fatty foods.  6 hours before the procedure - stop eating light meals or foods, such as toast or cereal.  2 hours before the procedure - stop drinking clear liquids. Ask your health care provider about:  Changing or stopping your regular medicines. This is especially important if you are taking diabetes medicines or blood thinners.  Taking medicines such as ibuprofen. These medicines can thin your blood. Do not take these medicines before your procedure if your health care provider instructs you not to. Generally,  aspirin is recommended before a procedure of passing a small, thin tube (catheter) through a blood vessel and into the heart (cardiac catheterization). What happens during the procedure?   An IV tube will be inserted into one of your veins.  You will be given one or more of the following: ? A medicine to help you relax (sedative). ? A medicine to numb the area where the catheter will be inserted into an artery (local anesthetic).  To reduce your risk of infection: ? Your health care team will wash or sanitize their hands. ? Your skin will be washed with soap. ? Hair may be removed from the area where the catheter will be inserted.  Using a guide wire, the catheter will be inserted into an artery. The location may be in your groin, in your wrist, or in the fold of your arm (near your elbow).  A type of X-ray (fluoroscopy) will be used to help guide the catheter to the opening of the arteries in the heart.  A dye will be injected into the catheter, and X-rays will be taken. The dye will help to show where any narrowing or blockages are located in  the arteries.  A tiny wire will be guided to the blocked spot, and a balloon will be inflated to make the artery wider.  The stent will be expanded and will crush the plaques into the wall of the vessel. The stent will hold the area open and improve the blood flow. Most stents have a drug coating to reduce the risk of the stent narrowing over time.  The artery may be made wider using a drill, laser, or other tools to remove plaques.  When the blood flow is better, the catheter will be removed. The lining of the artery will grow over the stent, which stays where it was placed. This procedure may vary among health care providers and hospitals. What happens after the procedure?  If the procedure is done through the leg, you will be kept in bed lying flat for about 6 hours. You will be instructed to not bend and not cross your legs.  The insertion  site will be checked frequently.  The pulse in your foot or wrist will be checked frequently.  You may have additional blood tests, X-rays, and a test that records the electrical activity of your heart (electrocardiogram, or ECG). This information is not intended to replace advice given to you by your health care provider. Make sure you discuss any questions you have with your health care provider. Document Released: 12/10/2002 Document Revised: 09/14/2017 Document Reviewed: 01/09/2016 Elsevier Interactive Patient Education  2019 [employer].

## 2018-06-25 NOTE — H&P (View-Only) (Signed)
Cardiology Office Note:    Date:  06/25/2018   ID:  Joe [name], DOB August 20, 1970, MRN [id number]  PCP:  Cyndi Bender, PA-C  Cardiologist:  Jenne Campus, MD    Referring MD: Cyndi Bender, PA-C   Chief Complaint  Patient presents with  . Follow up on Stress Echo  Not doing well  History of Present Illness:    Joe [name] is a 48 y.o. male with coronary artery disease status post PTCA and stenting to proximal LAD done in March of last year.  After that he had cardiac catheterization in August that required stenting of his circumflex artery.  Few weeks ago he showed up in my office complaining of not feeling well he said the same thing happening again.  Try medical therapy with limited response eventually we ended up doing stress testing stress test showed old apical infarct however also shows some peri-infarct ischemia he as well as his wife upset terrified with those findings I spent at least half an hour talking to them today explained to them what this finding indicates they did already Google search diuretic about ejection fraction being 30% which is reading by stress test and he absolutely paralyzed with those nurses.  I told him that this is something we can manage medically but I think honestly we reached the point that the only way to make him happy and satisfied will be to proceed again to cardiac catheterization.  If cardiac catheterization will not show any new target lesion then we continue medical therapy and then may be psychological support will be needed.  I explained to them cardiac catheterization again including all risk benefits as well as alternatives.  That will be third cardiac catheterization within a year for him.  Past Medical History:  Diagnosis Date  . Abnormal EKG   . Body mass index 45.0-49.9, adult ([place])   . Complication of anesthesia 1995   "had hard time waking up; breathing w/vasectomy"  . Coronary artery disease   . Erectile dysfunction   .  Essential hypertension, benign   . Fatigue   . GERD (gastroesophageal reflux disease)   . High cholesterol    "just started tx yesterday" (09/06/2017)  . History of gout    "RX prn" (09/06/2017)  . Muscular chest pain   . Obesity   . OSA on CPAP   . Plantar fasciitis   . Right bundle branch block   . Testosterone deficiency   . Type II diabetes mellitus ([place])    "started tx 08/28/2017"    Past Surgical History:  Procedure Laterality Date  . BACK SURGERY    . CORONARY ANGIOPLASTY WITH STENT PLACEMENT  09/06/2017   "3 stents"  . CORONARY STENT INTERVENTION N/A 09/06/2017   Procedure: CORONARY STENT INTERVENTION;  Surgeon: Jettie Booze, MD;  Location: [place] [facility];  Service: Cardiovascular;  Laterality: N/A;  . CORONARY STENT INTERVENTION N/A 02/15/2018   Procedure: CORONARY STENT INTERVENTION;  Surgeon: Jettie Booze, MD;  Location: [place] [facility];  Service: Cardiovascular;  Laterality: N/A;  . KNEE ARTHROSCOPY Right   . LEFT HEART CATH AND CORONARY ANGIOGRAPHY N/A 09/06/2017   Procedure: LEFT HEART CATH AND CORONARY ANGIOGRAPHY;  Surgeon: Jettie Booze, MD;  Location: [place] [facility];  Service: Cardiovascular;  Laterality: N/A;  . LEFT HEART CATH AND CORONARY ANGIOGRAPHY N/A 02/15/2018   Procedure: LEFT HEART CATH AND CORONARY ANGIOGRAPHY;  Surgeon: Jettie Booze, MD;  Location: [place] [facility];  Service:  Cardiovascular;  Laterality: N/A;  . LUMBAR SPINE SURGERY  ~ 2001   Intradiscal Electrothermoplasty  . VASECTOMY  1995    Current Medications: Current Meds  Medication Sig  . allopurinol (ZYLOPRIM) 100 MG tablet Take 100 mg by mouth daily as needed (gout).   Marland Kitchen aspirin EC 81 MG tablet Take 1 tablet (81 mg total) by mouth daily.  . carvedilol (COREG) 12.5 MG tablet Take 1 tablet (12.5 mg total) by mouth 2 (two) times daily.  . furosemide (LASIX) 40 MG tablet Take 1 tablet (40 mg total) by mouth daily.  . metFORMIN (GLUCOPHAGE-XR) 500 MG  24 hr tablet Take 500 mg by mouth 3 (three) times daily.   . nitroGLYCERIN (NITROSTAT) 0.4 MG SL tablet Place 1 tablet (0.4 mg total) under the tongue every 5 (five) minutes as needed for chest pain.  Marland Kitchen omeprazole (PRILOSEC) 40 MG capsule Take 40 mg by mouth daily.  . ranolazine (RANEXA) 500 MG 12 hr tablet Take 500 mg by mouth 2 (two) times daily.  . rosuvastatin (CRESTOR) 40 MG tablet Take 1 tablet (40 mg total) by mouth every evening.  . sacubitril-valsartan (ENTRESTO) 49-51 MG Take 1 tablet by mouth 2 (two) times daily.  Marland Kitchen spironolactone (ALDACTONE) 25 MG tablet Take 0.5 tablets (12.5 mg total) by mouth daily.  . ticagrelor (BRILINTA) 90 MG TABS tablet Take 1 tablet (90 mg total) by mouth 2 (two) times daily.     Allergies:   Patient has no known allergies.   Social History   Socioeconomic History  . Marital status: Married    Spouse name: Not on file  . Number of children: Not on file  . Years of education: Not on file  . Highest education level: Not on file  Occupational History  . Not on file  Social Needs  . Financial resource strain: Not on file  . Food insecurity:    Worry: Not on file    Inability: Not on file  . Transportation needs:    Medical: Not on file    Non-medical: Not on file  Tobacco Use  . Smoking status: Never Smoker  . Smokeless tobacco: Never Used  Substance and Sexual Activity  . Alcohol use: No    Frequency: Never  . Drug use: Never  . Sexual activity: Not Currently  Lifestyle  . Physical activity:    Days per week: Not on file    Minutes per session: Not on file  . Stress: Not on file  Relationships  . Social connections:    Talks on phone: Not on file    Gets together: Not on file    Attends religious service: Not on file    Active member of club or organization: Not on file    Attends meetings of clubs or organizations: Not on file    Relationship status: Not on file  Other Topics Concern  . Not on file  Social History Narrative  .  Not on file     Family History: The patient's family history includes Brain cancer in his mother; Diabetes Mellitus II in his brother and paternal grandfather; Lung cancer in his mother; Lymphoma in his father. ROS:   Please see the history of present illness.    All 14 point review of systems negative except as described per history of present illness  EKGs/Labs/Other Studies Reviewed:      Recent Labs: 09/07/2017: Hemoglobin 15.2; Platelets 224 09/14/2017: BUN 15; Creatinine, Ser 1.12; Potassium 4.6; Sodium 137 03/27/2018: ALT  18  Recent Lipid Panel    Component Value Date/Time   CHOL 124 03/27/2018 0940   TRIG 218 (H) 03/27/2018 0940   HDL 35 (L) 03/27/2018 0940   CHOLHDL 3.5 03/27/2018 0940   LDLCALC 45 03/27/2018 0940    Physical Exam:    VS:  BP 100/70   Pulse (!) 56   Wt (!) 380 lb 9.6 oz (172.6 kg)   SpO2 95%   BMI 47.57 kg/m     Wt Readings from Last 3 Encounters:  06/25/18 (!) 380 lb 9.6 oz (172.6 kg)  06/06/18 (!) 387 lb (175.5 kg)  06/04/18 (!) 388 lb 3.7 oz (176.1 kg)     GEN:  Well nourished, well developed in no acute distress HEENT: Normal NECK: No JVD; No carotid bruits LYMPHATICS: No lymphadenopathy CARDIAC: RRR, no murmurs, no rubs, no gallops RESPIRATORY:  Clear to auscultation without rales, wheezing or rhonchi  ABDOMEN: Soft, non-tender, non-distended MUSCULOSKELETAL:  No edema; No deformity  SKIN: Warm and dry LOWER EXTREMITIES: no swelling NEUROLOGIC:  Alert and oriented x 3 PSYCHIATRIC:  Normal affect   ASSESSMENT:    1. Abnormal EKG   2. Ischemic cardiomyopathy   3. Pre-operative clearance   4. Pre-op evaluation   5. CAD S/P percutaneous coronary angioplasty   6. Angina pectoris ([place])   7. Shortness of breath   8. Class 3 severe obesity due to excess calories without serious comorbidity in adult, unspecified BMI ([place])    PLAN:    In order of problems listed above:  1. Ischemic cardiomyopathy on maximal tolerated medication  the biggest limiting factor is blood pressure being low.  We will continue 2. Coronary artery disease status post PTCA and stenting to proximal LAD in March of last year and then PTCA and stenting circumflex done in August of last year.  Plan as outlined above in view of his symptoms and his anxiety medicine therapy situation will need to go to the definite test which will be cardiac catheterization. 3. Shortness of breath most likely multifactorial cardiac catheterization will be done to clarify cardiac nature of the problem 4. Ischemic cardiomyopathy with ejection fraction 35-40 in August by cardiac catheterization now stress test telling us ejection fraction 30%.  Again we reached the point of cardiac catheterization will be needed and will proceed.   Medication Adjustments/Labs and Tests Ordered: Current medicines are reviewed at length with the patient today.  Concerns regarding medicines are outlined above.  Orders Placed This Encounter  Procedures  . DG Chest 2 View  . CBC  . Basic metabolic panel  . EKG 12-Lead   Medication changes: No orders of the defined types were placed in this encounter.   Signed, Park Liter, MD, [facility] 06/25/2018 5:00 PM    [place]

## 2018-06-25 NOTE — Progress Notes (Signed)
Cardiology Office Note:    Date:  06/25/2018   ID:  Joe [name], DOB 11-27-1970, MRN [id number]  PCP:  Cyndi Bender, PA-C  Cardiologist:  Jenne Campus, MD    Referring MD: Cyndi Bender, PA-C   Chief Complaint  Patient presents with  . Follow up on Stress Echo  Not doing well  History of Present Illness:    Joe [name] is a 48 y.o. male with coronary artery disease status post PTCA and stenting to proximal LAD done in March of last year.  After that he had cardiac catheterization in August that required stenting of his circumflex artery.  Few weeks ago he showed up in my office complaining of not feeling well he said the same thing happening again.  Try medical therapy with limited response eventually we ended up doing stress testing stress test showed old apical infarct however also shows some peri-infarct ischemia he as well as his wife upset terrified with those findings I spent at least half an hour talking to them today explained to them what this finding indicates they did already Google search diuretic about ejection fraction being 30% which is reading by stress test and he absolutely paralyzed with those nurses.  I told him that this is something we can manage medically but I think honestly we reached the point that the only way to make him happy and satisfied will be to proceed again to cardiac catheterization.  If cardiac catheterization will not show any new target lesion then we continue medical therapy and then may be psychological support will be needed.  I explained to them cardiac catheterization again including all risk benefits as well as alternatives.  That will be third cardiac catheterization within a year for him.  Past Medical History:  Diagnosis Date  . Abnormal EKG   . Body mass index 45.0-49.9, adult ([place])   . Complication of anesthesia 1995   "had hard time waking up; breathing w/vasectomy"  . Coronary artery disease   . Erectile dysfunction   .  Essential hypertension, benign   . Fatigue   . GERD (gastroesophageal reflux disease)   . High cholesterol    "just started tx yesterday" (09/06/2017)  . History of gout    "RX prn" (09/06/2017)  . Muscular chest pain   . Obesity   . OSA on CPAP   . Plantar fasciitis   . Right bundle branch block   . Testosterone deficiency   . Type II diabetes mellitus ([place])    "started tx 08/28/2017"    Past Surgical History:  Procedure Laterality Date  . BACK SURGERY    . CORONARY ANGIOPLASTY WITH STENT PLACEMENT  09/06/2017   "3 stents"  . CORONARY STENT INTERVENTION N/A 09/06/2017   Procedure: CORONARY STENT INTERVENTION;  Surgeon: Jettie Booze, MD;  Location: [facility];  Service: Cardiovascular;  Laterality: N/A;  . CORONARY STENT INTERVENTION N/A 02/15/2018   Procedure: CORONARY STENT INTERVENTION;  Surgeon: Jettie Booze, MD;  Location: [facility];  Service: Cardiovascular;  Laterality: N/A;  . KNEE ARTHROSCOPY Right   . LEFT HEART CATH AND CORONARY ANGIOGRAPHY N/A 09/06/2017   Procedure: LEFT HEART CATH AND CORONARY ANGIOGRAPHY;  Surgeon: Jettie Booze, MD;  Location: [facility];  Service: Cardiovascular;  Laterality: N/A;  . LEFT HEART CATH AND CORONARY ANGIOGRAPHY N/A 02/15/2018   Procedure: LEFT HEART CATH AND CORONARY ANGIOGRAPHY;  Surgeon: Jettie Booze, MD;  Location: [facility];  Service:  Cardiovascular;  Laterality: N/A;  . LUMBAR SPINE SURGERY  ~ 2001   Intradiscal Electrothermoplasty  . VASECTOMY  1995    Current Medications: Current Meds  Medication Sig  . allopurinol (ZYLOPRIM) 100 MG tablet Take 100 mg by mouth daily as needed (gout).   Marland Kitchen aspirin EC 81 MG tablet Take 1 tablet (81 mg total) by mouth daily.  . carvedilol (COREG) 12.5 MG tablet Take 1 tablet (12.5 mg total) by mouth 2 (two) times daily.  . furosemide (LASIX) 40 MG tablet Take 1 tablet (40 mg total) by mouth daily.  . metFORMIN (GLUCOPHAGE-XR) 500 MG  24 hr tablet Take 500 mg by mouth 3 (three) times daily.   . nitroGLYCERIN (NITROSTAT) 0.4 MG SL tablet Place 1 tablet (0.4 mg total) under the tongue every 5 (five) minutes as needed for chest pain.  Marland Kitchen omeprazole (PRILOSEC) 40 MG capsule Take 40 mg by mouth daily.  . ranolazine (RANEXA) 500 MG 12 hr tablet Take 500 mg by mouth 2 (two) times daily.  . rosuvastatin (CRESTOR) 40 MG tablet Take 1 tablet (40 mg total) by mouth every evening.  . sacubitril-valsartan (ENTRESTO) 49-51 MG Take 1 tablet by mouth 2 (two) times daily.  Marland Kitchen spironolactone (ALDACTONE) 25 MG tablet Take 0.5 tablets (12.5 mg total) by mouth daily.  . ticagrelor (BRILINTA) 90 MG TABS tablet Take 1 tablet (90 mg total) by mouth 2 (two) times daily.     Allergies:   Patient has no known allergies.   Social History   Socioeconomic History  . Marital status: Married    Spouse name: Not on file  . Number of children: Not on file  . Years of education: Not on file  . Highest education level: Not on file  Occupational History  . Not on file  Social Needs  . Financial resource strain: Not on file  . Food insecurity:    Worry: Not on file    Inability: Not on file  . Transportation needs:    Medical: Not on file    Non-medical: Not on file  Tobacco Use  . Smoking status: Never Smoker  . Smokeless tobacco: Never Used  Substance and Sexual Activity  . Alcohol use: No    Frequency: Never  . Drug use: Never  . Sexual activity: Not Currently  Lifestyle  . Physical activity:    Days per week: Not on file    Minutes per session: Not on file  . Stress: Not on file  Relationships  . Social connections:    Talks on phone: Not on file    Gets together: Not on file    Attends religious service: Not on file    Active member of club or organization: Not on file    Attends meetings of clubs or organizations: Not on file    Relationship status: Not on file  Other Topics Concern  . Not on file  Social History Narrative  .  Not on file     Family History: The patient's family history includes Brain cancer in his mother; Diabetes Mellitus II in his brother and paternal grandfather; Lung cancer in his mother; Lymphoma in his father. ROS:   Please see the history of present illness.    All 14 point review of systems negative except as described per history of present illness  EKGs/Labs/Other Studies Reviewed:      Recent Labs: 09/07/2017: Hemoglobin 15.2; Platelets 224 09/14/2017: BUN 15; Creatinine, Ser 1.12; Potassium 4.6; Sodium 137 03/27/2018: ALT  18  Recent Lipid Panel    Component Value Date/Time   CHOL 124 03/27/2018 0940   TRIG 218 (H) 03/27/2018 0940   HDL 35 (L) 03/27/2018 0940   CHOLHDL 3.5 03/27/2018 0940   LDLCALC 45 03/27/2018 0940    Physical Exam:    VS:  BP 100/70   Pulse (!) 56   Wt (!) 380 lb 9.6 oz (172.6 kg)   SpO2 95%   BMI 47.57 kg/m     Wt Readings from Last 3 Encounters:  06/25/18 (!) 380 lb 9.6 oz (172.6 kg)  06/06/18 (!) 387 lb (175.5 kg)  06/04/18 (!) 388 lb 3.7 oz (176.1 kg)     GEN:  Well nourished, well developed in no acute distress HEENT: Normal NECK: No JVD; No carotid bruits LYMPHATICS: No lymphadenopathy CARDIAC: RRR, no murmurs, no rubs, no gallops RESPIRATORY:  Clear to auscultation without rales, wheezing or rhonchi  ABDOMEN: Soft, non-tender, non-distended MUSCULOSKELETAL:  No edema; No deformity  SKIN: Warm and dry LOWER EXTREMITIES: no swelling NEUROLOGIC:  Alert and oriented x 3 PSYCHIATRIC:  Normal affect   ASSESSMENT:    1. Abnormal EKG   2. Ischemic cardiomyopathy   3. Pre-operative clearance   4. Pre-op evaluation   5. CAD S/P percutaneous coronary angioplasty   6. Angina pectoris ([place])   7. Shortness of breath   8. Class 3 severe obesity due to excess calories without serious comorbidity in adult, unspecified BMI ([place])    PLAN:    In order of problems listed above:  1. Ischemic cardiomyopathy on maximal tolerated medication  the biggest limiting factor is blood pressure being low.  We will continue 2. Coronary artery disease status post PTCA and stenting to proximal LAD in March of last year and then PTCA and stenting circumflex done in August of last year.  Plan as outlined above in view of his symptoms and his anxiety medicine therapy situation will need to go to the definite test which will be cardiac catheterization. 3. Shortness of breath most likely multifactorial cardiac catheterization will be done to clarify cardiac nature of the problem 4. Ischemic cardiomyopathy with ejection fraction 35-40 in August by cardiac catheterization now stress test telling us ejection fraction 30%.  Again we reached the point of cardiac catheterization will be needed and will proceed.   Medication Adjustments/Labs and Tests Ordered: Current medicines are reviewed at length with the patient today.  Concerns regarding medicines are outlined above.  Orders Placed This Encounter  Procedures  . DG Chest 2 View  . CBC  . Basic metabolic panel  . EKG 12-Lead   Medication changes: No orders of the defined types were placed in this encounter.   Signed, Park Liter, MD, [facility] 06/25/2018 5:00 PM    [place]

## 2018-06-26 ENCOUNTER — Ambulatory Visit (HOSPITAL_COMMUNITY): Payer: BLUE CROSS/BLUE SHIELD

## 2018-06-26 LAB — CBC
Hematocrit: 42.5 % (ref 37.5–51.0)
Hemoglobin: 14.9 g/dL (ref 13.0–17.7)
MCH: 32 pg (ref 26.6–33.0)
MCHC: 35.1 g/dL (ref 31.5–35.7)
MCV: 91 fL (ref 79–97)
Platelets: 266 10*3/uL (ref 150–450)
RBC: 4.66 x10E6/uL (ref 4.14–5.80)
RDW: 12.9 % (ref 11.6–15.4)
WBC: 8.4 10*3/uL (ref 3.4–10.8)

## 2018-06-26 LAB — BASIC METABOLIC PANEL
BUN/Creatinine Ratio: 14 (ref 9–20)
BUN: 19 mg/dL (ref 6–24)
CO2: 25 mmol/L (ref 20–29)
Calcium: 9.6 mg/dL (ref 8.7–10.2)
Chloride: 99 mmol/L (ref 96–106)
Creatinine, Ser: 1.34 mg/dL — ABNORMAL HIGH (ref 0.76–1.27)
GFR calc Af Amer: 72 mL/min/{1.73_m2} (ref >59)
GFR calc non Af Amer: 63 mL/min/{1.73_m2} (ref >59)
Glucose: 140 mg/dL — ABNORMAL HIGH (ref 65–99)
Potassium: 4.6 mmol/L (ref 3.5–5.2)
Sodium: 140 mmol/L (ref 134–144)

## 2018-06-27 ENCOUNTER — Other Ambulatory Visit: Payer: Self-pay

## 2018-06-27 ENCOUNTER — Ambulatory Visit (HOSPITAL_COMMUNITY)
Admission: RE | Admit: 2018-06-27 | Discharge: 2018-06-27 | Disposition: A | Discharge status: Home / Self Care | Payer: BLUE CROSS/BLUE SHIELD | Attending: Interventional Cardiology | Admitting: Interventional Cardiology

## 2018-06-27 ENCOUNTER — Encounter (HOSPITAL_COMMUNITY): Payer: Self-pay | Admitting: *Deleted

## 2018-06-27 ENCOUNTER — Encounter (HOSPITAL_COMMUNITY)
Admission: RE | Disposition: A | Discharge status: Home / Self Care | Payer: Self-pay | Source: Home / Self Care | Attending: Interventional Cardiology

## 2018-06-27 DIAGNOSIS — K219 Gastro-esophageal reflux disease without esophagitis: Secondary | ICD-10-CM | POA: Diagnosis not present

## 2018-06-27 DIAGNOSIS — I2510 Atherosclerotic heart disease of native coronary artery without angina pectoris: Secondary | ICD-10-CM

## 2018-06-27 DIAGNOSIS — Z955 Presence of coronary angioplasty implant and graft: Secondary | ICD-10-CM

## 2018-06-27 DIAGNOSIS — I25118 Atherosclerotic heart disease of native coronary artery with other forms of angina pectoris: Secondary | ICD-10-CM | POA: Diagnosis present

## 2018-06-27 DIAGNOSIS — I255 Ischemic cardiomyopathy: Secondary | ICD-10-CM | POA: Diagnosis not present

## 2018-06-27 DIAGNOSIS — E7800 Pure hypercholesterolemia, unspecified: Secondary | ICD-10-CM | POA: Insufficient documentation

## 2018-06-27 DIAGNOSIS — G4733 Obstructive sleep apnea (adult) (pediatric): Secondary | ICD-10-CM | POA: Insufficient documentation

## 2018-06-27 DIAGNOSIS — Z7984 Long term (current) use of oral hypoglycemic drugs: Secondary | ICD-10-CM | POA: Diagnosis not present

## 2018-06-27 DIAGNOSIS — M109 Gout, unspecified: Secondary | ICD-10-CM | POA: Insufficient documentation

## 2018-06-27 DIAGNOSIS — F419 Anxiety disorder, unspecified: Secondary | ICD-10-CM | POA: Diagnosis not present

## 2018-06-27 DIAGNOSIS — I10 Essential (primary) hypertension: Secondary | ICD-10-CM | POA: Diagnosis not present

## 2018-06-27 DIAGNOSIS — Z7982 Long term (current) use of aspirin: Secondary | ICD-10-CM | POA: Insufficient documentation

## 2018-06-27 DIAGNOSIS — Z6841 Body Mass Index (BMI) 40.0 and over, adult: Secondary | ICD-10-CM | POA: Insufficient documentation

## 2018-06-27 DIAGNOSIS — E119 Type 2 diabetes mellitus without complications: Secondary | ICD-10-CM | POA: Insufficient documentation

## 2018-06-27 HISTORY — PX: LEFT HEART CATH AND CORONARY ANGIOGRAPHY: CATH118249

## 2018-06-27 LAB — GLUCOSE, CAPILLARY: Glucose-Capillary: 121 mg/dL — ABNORMAL HIGH (ref 70–99)

## 2018-06-27 SURGERY — LEFT HEART CATH AND CORONARY ANGIOGRAPHY
Anesthesia: LOCAL

## 2018-06-27 MED ORDER — VERAPAMIL HCL 2.5 MG/ML IV SOLN
INTRAVENOUS | Status: AC
  Filled 2018-06-27: qty 2

## 2018-06-27 MED ORDER — ASPIRIN 81 MG PO CHEW: 81.0000 mg | CHEWABLE_TABLET | ORAL | Status: DC

## 2018-06-27 MED ORDER — FENTANYL CITRATE (PF) 100 MCG/2ML IJ SOLN
INTRAMUSCULAR | Status: DC | PRN
  Administered 2018-06-27: 25 ug via INTRAVENOUS

## 2018-06-27 MED ORDER — SODIUM CHLORIDE 0.9 % IV SOLN
INTRAVENOUS | Status: DC
  Administered 2018-06-27: 11:00:00 via INTRAVENOUS

## 2018-06-27 MED ORDER — SODIUM CHLORIDE 0.9 % IV SOLN: 250.0000 mL | INTRAVENOUS | Status: DC | PRN

## 2018-06-27 MED ORDER — VERAPAMIL HCL 2.5 MG/ML IV SOLN
INTRAVENOUS | Status: DC | PRN
  Administered 2018-06-27: 10 mL via INTRA_ARTERIAL

## 2018-06-27 MED ORDER — SODIUM CHLORIDE 0.9% FLUSH: 3.0000 mL | Freq: Two times a day (BID) | INTRAVENOUS | Status: DC

## 2018-06-27 MED ORDER — HEPARIN SODIUM (PORCINE) 1000 UNIT/ML IJ SOLN
INTRAMUSCULAR | Status: DC | PRN
  Administered 2018-06-27: 6000 [IU] via INTRAVENOUS

## 2018-06-27 MED ORDER — ACETAMINOPHEN 325 MG PO TABS: 650.0000 mg | ORAL_TABLET | ORAL | Status: DC | PRN

## 2018-06-27 MED ORDER — METFORMIN HCL ER 500 MG PO TB24: 500.0000 mg | ORAL_TABLET | ORAL | 6 refills | Status: DC

## 2018-06-27 MED ORDER — HEPARIN (PORCINE) IN NACL 1000-0.9 UT/500ML-% IV SOLN
INTRAVENOUS | Status: AC
  Filled 2018-06-27: qty 1000

## 2018-06-27 MED ORDER — TICAGRELOR 90 MG PO TABS: 90.0000 mg | ORAL_TABLET | ORAL | Status: DC

## 2018-06-27 MED ORDER — MIDAZOLAM HCL 2 MG/2ML IJ SOLN
INTRAMUSCULAR | Status: AC
  Filled 2018-06-27: qty 2

## 2018-06-27 MED ORDER — SODIUM CHLORIDE 0.9% FLUSH: 3.0000 mL | INTRAVENOUS | Status: DC | PRN

## 2018-06-27 MED ORDER — FENTANYL CITRATE (PF) 100 MCG/2ML IJ SOLN
INTRAMUSCULAR | Status: AC
  Filled 2018-06-27: qty 2

## 2018-06-27 MED ORDER — MIDAZOLAM HCL 2 MG/2ML IJ SOLN
INTRAMUSCULAR | Status: DC | PRN
  Administered 2018-06-27: 1 mg via INTRAVENOUS

## 2018-06-27 MED ORDER — LIDOCAINE HCL (PF) 1 % IJ SOLN
INTRAMUSCULAR | Status: DC | PRN
  Administered 2018-06-27: 2 mL

## 2018-06-27 MED ORDER — IOHEXOL 350 MG/ML SOLN
INTRAVENOUS | Status: DC | PRN
  Administered 2018-06-27: 70 mL via INTRAVENOUS

## 2018-06-27 MED ORDER — ONDANSETRON HCL 4 MG/2ML IJ SOLN: 4.0000 mg | Freq: Four times a day (QID) | INTRAMUSCULAR | Status: DC | PRN

## 2018-06-27 MED ORDER — LIDOCAINE HCL (PF) 1 % IJ SOLN
INTRAMUSCULAR | Status: AC
  Filled 2018-06-27: qty 30

## 2018-06-27 MED ORDER — HEPARIN (PORCINE) IN NACL 1000-0.9 UT/500ML-% IV SOLN
INTRAVENOUS | Status: DC | PRN
  Administered 2018-06-27 (×2): 500 mL

## 2018-06-27 MED ORDER — HEPARIN SODIUM (PORCINE) 1000 UNIT/ML IJ SOLN
INTRAMUSCULAR | Status: AC
  Filled 2018-06-27: qty 1

## 2018-06-27 SURGICAL SUPPLY — 10 items
CATH INFINITI 5FR JL4 (CATHETERS) ×1 IMPLANT
CATH INFINITI JR4 5F (CATHETERS) ×1 IMPLANT
DEVICE RAD COMP TR BAND LRG (VASCULAR PRODUCTS) ×1 IMPLANT
GLIDESHEATH SLEND SS 6F .021 (SHEATH) ×1 IMPLANT
GUIDEWIRE INQWIRE 1.5J.035X260 (WIRE) IMPLANT
INQWIRE 1.5J .035X260CM (WIRE) ×2
KIT HEART LEFT (KITS) ×2 IMPLANT
PACK CARDIAC CATHETERIZATION (CUSTOM PROCEDURE TRAY) ×2 IMPLANT
TRANSDUCER W/STOPCOCK (MISCELLANEOUS) ×2 IMPLANT
TUBING CIL FLEX 10 FLL-RA (TUBING) ×2 IMPLANT

## 2018-06-27 NOTE — Interval H&P Note (Signed)
Cath Lab Visit (complete for each Cath Lab visit)  Clinical Evaluation Leading to the Procedure:   ACS: No.  Non-ACS:    Anginal Classification: CCS III  Anti-ischemic medical therapy: Minimal Therapy (1 class of medications)  Non-Invasive Test Results: Intermediate-risk stress test findings: cardiac mortality 1-3%/year  Prior CABG: No previous CABG    Low EF  History and Physical Interval Note:  06/27/2018 1:20 PM  Joe [name]  has presented today for surgery, with the diagnosis of abnormal nuc  The various methods of treatment have been discussed with the patient and family. After consideration of risks, benefits and other options for treatment, the patient has consented to  Procedure(s): LEFT HEART CATH AND CORONARY ANGIOGRAPHY (N/A) as a surgical intervention .  The patient's history has been reviewed, patient examined, no change in status, stable for surgery.  I have reviewed the patient's chart and labs.  Questions were answered to the patient's satisfaction.     Larae Grooms

## 2018-06-27 NOTE — Progress Notes (Signed)
Cardiac Individual Treatment Plan  Patient Details  Name: Joe [name] MRN: [id number] Date of Birth: 1971-03-23 Referring Provider:     CARDIAC REHAB PHASE II ORIENTATION from 06/04/2018 in [facility]  Referring Provider  Park Liter, MD      Initial Encounter Date:    CARDIAC REHAB PHASE II ORIENTATION from 06/04/2018 in [facility]  Date  06/04/18      Visit Diagnosis: 02/15/2018 Stented coronary artery, S/P DES CFX  Patient's Home Medications on Admission: No current facility-administered medications for this visit.   Current Outpatient Medications:  .  [START ON 06/29/2018] metFORMIN (GLUCOPHAGE-XR) 500 MG 24 hr tablet, Take 1-2 tablets (500-1,000 mg total) by mouth See admin instructions. Take 500 mg by mouth in the morning and take 1000 mg by mouth in the evening, Disp: , Rfl: 6  Facility-Administered Medications Ordered in Other Visits:  .  0.9 %  sodium chloride infusion, 250 mL, Intravenous, PRN, Park Liter, MD .  0.9 %  sodium chloride infusion, , Intravenous, Continuous, Park Liter, MD, Last Rate: 10 mL/hr at 06/27/18 1037 .  0.9 %  sodium chloride infusion, 250 mL, Intravenous, PRN, Jettie Booze, MD .  acetaminophen (TYLENOL) tablet 650 mg, 650 mg, Oral, Q4H PRN, Jettie Booze, MD .  aspirin chewable tablet 81 mg, 81 mg, Oral, Pre-Cath, Park Liter, MD .  ondansetron [facility]) injection 4 mg, 4 mg, Intravenous, Q6H PRN, Jettie Booze, MD .  sodium chloride flush (NS) 0.9 % injection 3 mL, 3 mL, Intravenous, Q12H, Park Liter, MD .  sodium chloride flush (NS) 0.9 % injection 3 mL, 3 mL, Intravenous, PRN, Park Liter, MD .  sodium chloride flush (NS) 0.9 % injection 3 mL, 3 mL, Intravenous, Q12H, Varanasi, Jayadeep S, MD .  sodium chloride flush (NS) 0.9 % injection 3 mL, 3 mL, Intravenous, PRN, Jettie Booze, MD .  ticagrelor  [facility]) tablet 90 mg, 90 mg, Oral, Pre-Cath, Park Liter, MD  Past Medical History: Past Medical History:  Diagnosis Date  . Abnormal EKG   . Body mass index 45.0-49.9, adult ([place])   . Complication of anesthesia 1995   "had hard time waking up; breathing w/vasectomy"  . Coronary artery disease   . Erectile dysfunction   . Essential hypertension, benign   . Fatigue   . GERD (gastroesophageal reflux disease)   . High cholesterol    "just started tx yesterday" (09/06/2017)  . History of gout    "RX prn" (09/06/2017)  . Muscular chest pain   . Obesity   . OSA on CPAP   . Plantar fasciitis   . Right bundle branch block   . Testosterone deficiency   . Type II diabetes mellitus ([place])    "started tx 08/28/2017"    Tobacco Use: Social History   Tobacco Use  Smoking Status Never Smoker  Smokeless Tobacco Never Used    Labs: Recent Review Scientist, physiological    Labs for ITP Cardiac and Pulmonary Rehab Latest Ref Rng & Units 03/27/2018   Cholestrol 100 - 199 mg/dL 124   LDLCALC 0 - 99 mg/dL 45   HDL >39 mg/dL 35(L)   Trlycerides 0 - 149 mg/dL 218(H)      Capillary Blood Glucose: Lab Results  Component Value Date   GLUCAP 121 (H) 06/27/2018   GLUCAP 188 (H) 06/04/2018   GLUCAP 132 (H) 02/15/2018   GLUCAP 164 (H)  02/15/2018   GLUCAP 220 (H) 09/08/2017     Exercise Target Goals: Exercise Program Goal: Individual exercise prescription set using results from initial 6 min walk test and THRR while considering  patient's activity barriers and safety.   Exercise Prescription Goal: Initial exercise prescription builds to 30-45 minutes a day of aerobic activity, 2-3 days per week.  Home exercise guidelines will be given to patient during program as part of exercise prescription that the participant will acknowledge.  Activity Barriers & Risk Stratification: Activity Barriers & Cardiac Risk Stratification - 06/04/18 1422      Activity Barriers & Cardiac Risk  Stratification   Activity Barriers  Back Problems;Other (comment)    Comments  Occassional back pain. Tingling sensation in quads with a lot of walking.    Cardiac Risk Stratification  Moderate       6 Minute Walk: 6 Minute Walk    Row Name 06/04/18 1431         6 Minute Walk   Phase  Initial     Distance  1400 feet     Walk Time  6 minutes     # of Rest Breaks  0     MPH  2.65     METS  2.86     RPE  11     Perceived Dyspnea   0     VO2 Peak  10.02     Symptoms  Yes (comment)     Comments  Patient c/o chest tightness, whcih he rated a 1/10 on the pain scale. Chest tightness resolved with rest.     Resting HR  81 bpm     Resting BP  118/60     Resting Oxygen Saturation   98 %     Exercise Oxygen Saturation  during 6 min walk  95 %     Max Ex. HR  104 bpm     Max Ex. BP  118/62     2 Minute Post BP  104/62        Oxygen Initial Assessment:   Oxygen Re-Evaluation:   Oxygen Discharge (Final Oxygen Re-Evaluation):   Initial Exercise Prescription: Initial Exercise Prescription - 06/04/18 1500      Date of Initial Exercise RX and Referring Provider   Date  06/04/18    Referring Provider  Park Liter, MD    Expected Discharge Date  09/09/18      T5 Nustep   Level  3    SPM  85    Minutes  20    METs  2.8      Track   Laps  10    Minutes  10    METs  2.74      Prescription Details   Frequency (times per week)  3    Duration  Progress to 30 minutes of continuous aerobic without signs/symptoms of physical distress      Intensity   THRR 40-80% of Max Heartrate  69-138    Ratings of Perceived Exertion  11-13    Perceived Dyspnea  0-4      Progression   Progression  Continue to progress workloads to maintain intensity without signs/symptoms of physical distress.      Resistance Training   Training Prescription  Yes    Weight  4lbs    Reps  10-15       Perform Capillary Blood Glucose checks as needed.  Exercise Prescription  Changes:   Exercise Comments:  Exercise Goals and Review:  Exercise Goals    Row Name 06/04/18 1450             Exercise Goals   Increase Physical Activity  Yes       Intervention  Provide advice, education, support and counseling about physical activity/exercise needs.;Develop an individualized exercise prescription for aerobic and resistive training based on initial evaluation findings, risk stratification, comorbidities and participant's personal goals.       Expected Outcomes  Short Term: Attend rehab on a regular basis to increase amount of physical activity.;Long Term: Add in home exercise to make exercise part of routine and to increase amount of physical activity.;Long Term: Exercising regularly at least 3-5 days a week.       Increase Strength and Stamina  Yes       Intervention  Develop an individualized exercise prescription for aerobic and resistive training based on initial evaluation findings, risk stratification, comorbidities and participant's personal goals.;Provide advice, education, support and counseling about physical activity/exercise needs.       Expected Outcomes  Short Term: Increase workloads from initial exercise prescription for resistance, speed, and METs.;Short Term: Perform resistance training exercises routinely during rehab and add in resistance training at home;Long Term: Improve cardiorespiratory fitness, muscular endurance and strength as measured by increased METs and functional capacity (6MWT)       Able to understand and use rate of perceived exertion (RPE) scale  Yes       Intervention  Provide education and explanation on how to use RPE scale       Expected Outcomes  Short Term: Able to use RPE daily in rehab to express subjective intensity level;Long Term:  Able to use RPE to guide intensity level when exercising independently       Knowledge and understanding of Target Heart Rate Range (THRR)  Yes       Intervention  Provide education and  explanation of THRR including how the numbers were predicted and where they are located for reference       Expected Outcomes  Short Term: Able to state/look up THRR;Long Term: Able to use THRR to govern intensity when exercising independently;Short Term: Able to use daily as guideline for intensity in rehab       Able to check pulse independently  Yes       Intervention  Provide education and demonstration on how to check pulse in carotid and radial arteries.;Review the importance of being able to check your own pulse for safety during independent exercise       Expected Outcomes  Short Term: Able to explain why pulse checking is important during independent exercise;Long Term: Able to check pulse independently and accurately       Understanding of Exercise Prescription  Yes       Intervention  Provide education, explanation, and written materials on patient's individual exercise prescription       Expected Outcomes  Short Term: Able to explain program exercise prescription;Long Term: Able to explain home exercise prescription to exercise independently          Exercise Goals Re-Evaluation :   Discharge Exercise Prescription (Final Exercise Prescription Changes):   Nutrition:  Target Goals: Understanding of nutrition guidelines, daily intake of sodium 1500mg , cholesterol 200mg , calories 30% from fat and 7% or less from saturated fats, daily to have 5 or more servings of fruits and vegetables.  Biometrics: Pre Biometrics - 06/04/18 1431      Pre Biometrics  Height  6\' 3"  (1.905 m)    Weight  (!) 388 lb 3.7 oz (176.1 kg)    Waist Circumference  58.5 inches    Hip Circumference  53.25 inches    Waist to Hip Ratio  1.1 %    BMI (Calculated)  48.53    Triceps Skinfold  58 mm    % Body Fat  48 %    Grip Strength  45.25 kg    Flexibility  0 in   Unable to straighten legs completely   Single Leg Stand  2.43 seconds        Nutrition Therapy Plan and Nutrition Goals: Nutrition  Therapy & Goals - 06/05/18 0904      Nutrition Therapy   Diet  heart healthy, diabetic      Personal Nutrition Goals   Nutrition Goal  Pt to identify and limit food sources of saturated fat, trans fat, refined carbohydrates and sodium    Personal Goal #2  Pt to identify food quantities necessary to achieve weight loss of 6-24 lbs. at graduation from cardiac rehab    Personal Goal #3  Pt to eat more frequently across the day    Personal Goal #4  Pt able to name foods that affect blood glucose.      Intervention Plan   Intervention  Prescribe, educate and counsel regarding individualized specific dietary modifications aiming towards targeted core components such as weight, hypertension, lipid management, diabetes, heart failure and other comorbidities.    Expected Outcomes  Short Term Goal: Understand basic principles of dietary content, such as calories, fat, sodium, cholesterol and nutrients.;Long Term Goal: Adherence to prescribed nutrition plan.       Nutrition Assessments: Nutrition Assessments - 06/05/18 0906      MEDFICTS Scores   Pre Score  69       Nutrition Goals Re-Evaluation:   Nutrition Goals Re-Evaluation:   Nutrition Goals Discharge (Final Nutrition Goals Re-Evaluation):   Psychosocial: Target Goals: Acknowledge presence or absence of significant depression and/or stress, maximize coping skills, provide positive support system. Participant is able to verbalize types and ability to use techniques and skills needed for reducing stress and depression.  Initial Review & Psychosocial Screening: Initial Psych Review & Screening - 06/04/18 1457      Initial Review   Current issues with  Current Stress Concerns    Source of Stress Concerns  Chronic Illness      Family Dynamics   Good Support System?  Yes   Derel has his wife and children for support     Barriers   Psychosocial barriers to participate in program  The patient should benefit from training in  stress management and relaxation.      Screening Interventions   Interventions  Encouraged to exercise;To provide support and resources with identified psychosocial needs;Provide feedback about the scores to participant    Expected Outcomes  Short Term goal: Utilizing psychosocial counselor, staff and physician to assist with identification of specific Stressors or current issues interfering with healing process. Setting desired goal for each stressor or current issue identified.;Long Term Goal: Stressors or current issues are controlled or eliminated.;Short Term goal: Identification and review with participant of any Quality of Life or Depression concerns found by scoring the questionnaire.;Long Term goal: The participant improves quality of Life and PHQ9 Scores as seen by post scores and/or verbalization of changes       Quality of Life Scores: Quality of Life - 06/04/18 1405  Quality of Life   Select  Quality of Life      Quality of Life Scores   Health/Function Pre  16 %    Socioeconomic Pre  24.86 %    Psych/Spiritual Pre  13.71 %    Family Pre  21.6 %    GLOBAL Pre  18.18 %      Scores of 19 and below usually indicate a poorer quality of life in these areas.  A difference of  2-3 points is a clinically meaningful difference.  A difference of 2-3 points in the total score of the Quality of Life Index has been associated with significant improvement in overall quality of life, self-image, physical symptoms, and general health in studies assessing change in quality of life.  PHQ-9: Recent Review Flowsheet Data    There is no flowsheet data to display.     Interpretation of Total Score  Total Score Depression Severity:  1-4 = Minimal depression, 5-9 = Mild depression, 10-14 = Moderate depression, 15-19 = Moderately severe depression, 20-27 = Severe depression   Psychosocial Evaluation and Intervention:   Psychosocial Re-Evaluation:   Psychosocial Discharge (Final  Psychosocial Re-Evaluation):   Vocational Rehabilitation: Provide vocational rehab assistance to qualifying candidates.   Vocational Rehab Evaluation & Intervention: Vocational Rehab - 06/04/18 1459      Initial Vocational Rehab Evaluation & Intervention   Assessment shows need for Vocational Rehabilitation  No   Shashwat is currently working and does not need vocational rehab at this time      Education: Education Goals: Education classes will be provided on a weekly basis, covering required topics. Participant will state understanding/return demonstration of topics presented.  Learning Barriers/Preferences: Learning Barriers/Preferences - 06/04/18 1458      Learning Barriers/Preferences   Learning Barriers  None    Learning Preferences  Skilled Demonstration;Pictoral;Video       Education Topics: Count Your Pulse:  -Group instruction provided by verbal instruction, demonstration, patient participation and written materials to support subject.  Instructors address importance of being able to find your pulse and how to count your pulse when at home without a heart monitor.  Patients get hands on experience counting their pulse with staff help and individually.   Heart Attack, Angina, and Risk Factor Modification:  -Group instruction provided by verbal instruction, video, and written materials to support subject.  Instructors address signs and symptoms of angina and heart attacks.    Also discuss risk factors for heart disease and how to make changes to improve heart health risk factors.   Functional Fitness:  -Group instruction provided by verbal instruction, demonstration, patient participation, and written materials to support subject.  Instructors address safety measures for doing things around the house.  Discuss how to get up and down off the floor, how to pick things up properly, how to safely get out of a chair without assistance, and balance training.   Meditation and  Mindfulness:  -Group instruction provided by verbal instruction, patient participation, and written materials to support subject.  Instructor addresses importance of mindfulness and meditation practice to help reduce stress and improve awareness.  Instructor also leads participants through a meditation exercise.    Stretching for Flexibility and Mobility:  -Group instruction provided by verbal instruction, patient participation, and written materials to support subject.  Instructors lead participants through series of stretches that are designed to increase flexibility thus improving mobility.  These stretches are additional exercise for major muscle groups that are typically performed during regular warm  up and cool down.   Hands Only CPR:  -Group verbal, video, and participation provides a basic overview of AHA guidelines for community CPR. Role-play of emergencies allow participants the opportunity to practice calling for help and chest compression technique with discussion of AED use.   Hypertension: -Group verbal and written instruction that provides a basic overview of hypertension including the most recent diagnostic guidelines, risk factor reduction with self-care instructions and medication management.    Nutrition I class: Heart Healthy Eating:  -Group instruction provided by PowerPoint slides, verbal discussion, and written materials to support subject matter. The instructor gives an explanation and review of the Therapeutic Lifestyle Changes diet recommendations, which includes a discussion on lipid goals, dietary fat, sodium, fiber, plant stanol/sterol esters, sugar, and the components of a well-balanced, healthy diet.   Nutrition II class: Lifestyle Skills:  -Group instruction provided by PowerPoint slides, verbal discussion, and written materials to support subject matter. The instructor gives an explanation and review of label reading, grocery shopping for heart health, heart  healthy recipe modifications, and ways to make healthier choices when eating out.   Diabetes Question & Answer:  -Group instruction provided by PowerPoint slides, verbal discussion, and written materials to support subject matter. The instructor gives an explanation and review of diabetes co-morbidities, pre- and post-prandial blood glucose goals, pre-exercise blood glucose goals, signs, symptoms, and treatment of hypoglycemia and hyperglycemia, and foot care basics.   Diabetes Blitz:  -Group instruction provided by PowerPoint slides, verbal discussion, and written materials to support subject matter. The instructor gives an explanation and review of the physiology behind type 1 and type 2 diabetes, diabetes medications and rational behind using different medications, pre- and post-prandial blood glucose recommendations and Hemoglobin A1c goals, diabetes diet, and exercise including blood glucose guidelines for exercising safely.    Portion Distortion:  -Group instruction provided by PowerPoint slides, verbal discussion, written materials, and food models to support subject matter. The instructor gives an explanation of serving size versus portion size, changes in portions sizes over the last 20 years, and what consists of a serving from each food group.   Stress Management:  -Group instruction provided by verbal instruction, video, and written materials to support subject matter.  Instructors review role of stress in heart disease and how to cope with stress positively.     Exercising on Your Own:  -Group instruction provided by verbal instruction, power point, and written materials to support subject.  Instructors discuss benefits of exercise, components of exercise, frequency and intensity of exercise, and end points for exercise.  Also discuss use of nitroglycerin and activating EMS.  Review options of places to exercise outside of rehab.  Review guidelines for sex with heart  disease.   Cardiac Drugs I:  -Group instruction provided by verbal instruction and written materials to support subject.  Instructor reviews cardiac drug classes: antiplatelets, anticoagulants, beta blockers, and statins.  Instructor discusses reasons, side effects, and lifestyle considerations for each drug class.   Cardiac Drugs II:  -Group instruction provided by verbal instruction and written materials to support subject.  Instructor reviews cardiac drug classes: angiotensin converting enzyme inhibitors (ACE-I), angiotensin II receptor blockers (ARBs), nitrates, and calcium channel blockers.  Instructor discusses reasons, side effects, and lifestyle considerations for each drug class.   Anatomy and Physiology of the Circulatory System:  Group verbal and written instruction and models provide basic cardiac anatomy and physiology, with the coronary electrical and arterial systems. Review of: AMI, Angina, Valve disease, Heart  Failure, Peripheral Artery Disease, Cardiac Arrhythmia, Pacemakers, and the ICD.   Other Education:  -Group or individual verbal, written, or video instructions that support the educational goals of the cardiac rehab program.   Holiday Eating Survival Tips:  -Group instruction provided by PowerPoint slides, verbal discussion, and written materials to support subject matter. The instructor gives patients tips, tricks, and techniques to help them not only survive but enjoy the holidays despite the onslaught of food that accompanies the holidays.   Knowledge Questionnaire Score: Knowledge Questionnaire Score - 06/04/18 1405      Knowledge Questionnaire Score   Pre Score  19/24       Core Components/Risk Factors/Patient Goals at Admission: Personal Goals and Risk Factors at Admission - 06/04/18 1452      Core Components/Risk Factors/Patient Goals on Admission    Weight Management  Yes;Obesity;Weight Loss    Intervention  Weight Management/Obesity: Establish  reasonable short term and long term weight goals.;Obesity: Provide education and appropriate resources to help participant work on and attain dietary goals.    Admit Weight  388 lb 3.7 oz (176.1 kg)    Goal Weight: Short Term  382 lb (173.3 kg)    Goal Weight: Long Term  288 lb (130.6 kg)    Expected Outcomes  Short Term: Continue to assess and modify interventions until short term weight is achieved;Long Term: Adherence to nutrition and physical activity/exercise program aimed toward attainment of established weight goal;Weight Loss: Understanding of general recommendations for a balanced deficit meal plan, which promotes 1-2 lb weight loss per week and includes a negative energy balance of [phone] kcal/d;Understanding recommendations for meals to include 15-35% energy as protein, 25-35% energy from fat, 35-60% energy from carbohydrates, less than 200mg  of dietary cholesterol, 20-35 gm of total fiber daily;Understanding of distribution of calorie intake throughout the day with the consumption of 4-5 meals/snacks    Diabetes  Yes    Intervention  Provide education about signs/symptoms and action to take for hypo/hyperglycemia.;Provide education about proper nutrition, including hydration, and aerobic/resistive exercise prescription along with prescribed medications to achieve blood glucose in normal ranges: Fasting glucose 65-99 mg/dL    Expected Outcomes  Long Term: Attainment of HbA1C < 7%.;Short Term: Participant verbalizes understanding of the signs/symptoms and immediate care of hyper/hypoglycemia, proper foot care and importance of medication, aerobic/resistive exercise and nutrition plan for blood glucose control.    Hypertension  Yes    Intervention  Provide education on lifestyle modifcations including regular physical activity/exercise, weight management, moderate sodium restriction and increased consumption of fresh fruit, vegetables, and low fat dairy, alcohol moderation, and smoking  cessation.;Monitor prescription use compliance.    Expected Outcomes  Short Term: Continued assessment and intervention until BP is < 140/80mm HG in hypertensive participants. < 130/63mm HG in hypertensive participants with diabetes, heart failure or chronic kidney disease.;Long Term: Maintenance of blood pressure at goal levels.    Lipids  Yes    Intervention  Provide education and support for participant on nutrition & aerobic/resistive exercise along with prescribed medications to achieve LDL 70mg , HDL >40mg .    Expected Outcomes  Short Term: Participant states understanding of desired cholesterol values and is compliant with medications prescribed. Participant is following exercise prescription and nutrition guidelines.;Long Term: Cholesterol controlled with medications as prescribed, with individualized exercise RX and with personalized nutrition plan. Value goals: LDL < 70mg , HDL > 40 mg.    Stress  Yes    Intervention  Offer individual and/or small group education and  counseling on adjustment to heart disease, stress management and health-related lifestyle change. Teach and support self-help strategies.;Refer participants experiencing significant psychosocial distress to appropriate mental health specialists for further evaluation and treatment. When possible, include family members and significant others in education/counseling sessions.    Expected Outcomes  Short Term: Participant demonstrates changes in health-related behavior, relaxation and other stress management skills, ability to obtain effective social support, and compliance with psychotropic medications if prescribed.;Long Term: Emotional wellbeing is indicated by absence of clinically significant psychosocial distress or social isolation.       Core Components/Risk Factors/Patient Goals Review:    Core Components/Risk Factors/Patient Goals at Discharge (Final Review):    ITP Comments: ITP Comments    Row Name 06/04/18 1412  06/27/18 1544         ITP Comments  Medical Director- Dr. Fransico Him, MD  30 Day ITP comment. Mr Pickel's cardiac rehab reamins on hold pending further testing         Comments: See ITP comments. Mr Violante's cardiac rehab is currently on hold.Barnet Pall, RN,BSN 06/27/2018 3:46 PM

## 2018-06-27 NOTE — Discharge Instructions (Signed)
Radial Site Care ° °This sheet gives you information about how to care for yourself after your procedure. Your health care provider may also give you more specific instructions. If you have problems or questions, contact your health care provider. °What can I expect after the procedure? °After the procedure, it is common to have: °· Bruising and tenderness at the catheter insertion area. °Follow these instructions at home: °Medicines °· Take over-the-counter and prescription medicines only as told by your health care provider. °Insertion site care °· Follow instructions from your health care provider about how to take care of your insertion site. Make sure you: °? Wash your hands with soap and water before you change your bandage (dressing). If soap and water are not available, use hand sanitizer. °? Change your dressing as told by your health care provider. °? Leave stitches (sutures), skin glue, or adhesive strips in place. These skin closures may need to stay in place for 2 weeks or longer. If adhesive strip edges start to loosen and curl up, you may trim the loose edges. Do not remove adhesive strips completely unless your health care provider tells you to do that. °· Check your insertion site every day for signs of infection. Check for: °? Redness, swelling, or pain. °? Fluid or blood. °? Pus or a bad smell. °? Warmth. °· Do not take baths, swim, or use a hot tub until your health care provider approves. °· You may shower 24-48 hours after the procedure, or as directed by your health care provider. °? Remove the dressing and gently wash the site with plain soap and water. °? Pat the area dry with a clean towel. °? Do not rub the site. That could cause bleeding. °· Do not apply powder or lotion to the site. °Activity ° °· For 24 hours after the procedure, or as directed by your health care provider: °? Do not flex or bend the affected arm. °? Do not push or pull heavy objects with the affected arm. °? Do not  drive yourself home from the hospital or clinic. You may drive 24 hours after the procedure unless your health care provider tells you not to. °? Do not operate machinery or power tools. °· Do not lift anything that is heavier than 10 lb (4.5 kg), or the limit that you are told, until your health care provider says that it is safe. °· Ask your health care provider when it is okay to: °? Return to work or school. °? Resume usual physical activities or sports. °? Resume sexual activity. °General instructions °· If the catheter site starts to bleed, raise your arm and put firm pressure on the site. If the bleeding does not stop, get help right away. This is a medical emergency. °· If you went home on the same day as your procedure, a responsible adult should be with you for the first 24 hours after you arrive home. °· Keep all follow-up visits as told by your health care provider. This is important. °Contact a health care provider if: °· You have a fever. °· You have redness, swelling, or yellow drainage around your insertion site. °Get help right away if: °· You have unusual pain at the radial site. °· The catheter insertion area swells very fast. °· The insertion area is bleeding, and the bleeding does not stop when you hold steady pressure on the area. °· Your arm or hand becomes pale, cool, tingly, or numb. °These symptoms may represent a serious problem   that is an emergency. Do not wait to see if the symptoms will go away. Get medical help right away. Call your local emergency services (911 in the [place] Do not drive yourself to the hospital. °Summary °· After the procedure, it is common to have bruising and tenderness at the site. °· Follow instructions from your health care provider about how to take care of your radial site wound. Check the wound every day for signs of infection. °· Do not lift anything that is heavier than 10 lb (4.5 kg), or the limit that you are told, until your health care provider says  that it is safe. °This information is not intended to replace advice given to you by your health care provider. Make sure you discuss any questions you have with your health care provider. °Document Released: 07/08/2010 Document Revised: 07/11/2017 Document Reviewed: 07/11/2017 °Elsevier Interactive Patient Education © 2019 [employer] ° °

## 2018-06-28 ENCOUNTER — Other Ambulatory Visit (HOSPITAL_COMMUNITY): Payer: Self-pay | Admitting: Physician Assistant

## 2018-06-28 ENCOUNTER — Encounter (HOSPITAL_COMMUNITY): Payer: Self-pay | Admitting: Interventional Cardiology

## 2018-06-28 ENCOUNTER — Ambulatory Visit (HOSPITAL_COMMUNITY): Payer: BLUE CROSS/BLUE SHIELD

## 2018-07-01 ENCOUNTER — Ambulatory Visit (HOSPITAL_COMMUNITY): Payer: BLUE CROSS/BLUE SHIELD

## 2018-07-02 ENCOUNTER — Ambulatory Visit: Payer: BLUE CROSS/BLUE SHIELD | Admitting: Cardiology

## 2018-07-02 ENCOUNTER — Encounter: Payer: Self-pay | Admitting: Cardiology

## 2018-07-02 VITALS — BP 132/68 | HR 64 | Ht 75.0 in | Wt 387.4 lb

## 2018-07-02 DIAGNOSIS — R5381 Other malaise: Secondary | ICD-10-CM

## 2018-07-02 DIAGNOSIS — I255 Ischemic cardiomyopathy: Secondary | ICD-10-CM

## 2018-07-02 DIAGNOSIS — R5383 Other fatigue: Secondary | ICD-10-CM

## 2018-07-02 DIAGNOSIS — I5021 Acute systolic (congestive) heart failure: Secondary | ICD-10-CM | POA: Diagnosis not present

## 2018-07-02 DIAGNOSIS — I10 Essential (primary) hypertension: Secondary | ICD-10-CM

## 2018-07-02 DIAGNOSIS — R0602 Shortness of breath: Secondary | ICD-10-CM

## 2018-07-02 NOTE — Patient Instructions (Addendum)
Medication Instructions:  Your physician recommends that you continue on your current medications as directed. Please refer to the Current Medication list given to you today.  If you need a refill on your cardiac medications before your next appointment, please call your pharmacy.   Lab work: Your physician recommends that you return for lab work today: Bmp, and bnp  If you have labs (blood work) drawn today and your tests are completely normal, you will receive your results only by: Marland Kitchen MyChart Message (if you have MyChart) OR . A paper copy in the mail If you have any lab test that is abnormal or we need to change your treatment, we will call you to review the results.  Testing/Procedures: Your physician has requested that you have an echocardiogram. Echocardiography is a painless test that uses sound waves to create images of your heart. It provides your doctor with information about the size and shape of your heart and how well your heart's chambers and valves are working. This procedure takes approximately one hour. There are no restrictions for this procedure.    Follow-Up: At [facility], you and your health needs are our priority.  As part of our continuing mission to provide you with exceptional heart care, we have created designated Provider Care Teams.  These Care Teams include your primary Cardiologist (physician) and Advanced Practice Providers (APPs -  Physician Assistants and Nurse Practitioners) who all work together to provide you with the care you need, when you need it. You will need a follow up appointment in 2 months.  Please call our office 2 months in advance to schedule this appointment.  You may see Jenne Campus, MD or another member of our [place] Provider Team in [place]: Shirlee More, MD . Jyl Heinz, MD  Any Other Special Instructions Will Be Listed Below (If Applicable).

## 2018-07-02 NOTE — Progress Notes (Signed)
Cardiology Office Note:    Date:  07/02/2018   ID:  Joe [name], DOB [birth year], MRN [id number]  PCP:  Cyndi Bender, PA-C  Cardiologist:  Jenne Campus, MD    Referring MD: Cyndi Bender, PA-C   Chief Complaint  Patient presents with  . Follow up on Cath  I had cardiac catheterization  History of Present Illness:    Joe [name] is a 48 y.o. male with coronary artery disease status post 3 cardiac catheterization within 1 year first led to stenting and proximal LAD second to stenting to me through circumflex artery and then 31 done just few weeks ago showed patent stents distal LAD appears to be diffusely diseased on the stress test that was done before cardiac catheterization he got ischemia involving apex.  Doing fine seems to be optimistic at this time complain of having exertional shortness of breath LVEDP was only mildly elevated.  We had a long discussion about what to do with the situation there is no doubt in my mind that significant weight play a role here he weighs almost 400 pounds.  I told him if he lose some weight that will significantly improve his overall wellbeing we talked about techniques how to do it he is committed to go to gym and I recommended to do that.  I will ask him today to have Chem-7 as well as proBNP based on that I will decide if he can increase dose of furosemide.  Past Medical History:  Diagnosis Date  . Abnormal EKG   . Body mass index 45.0-49.9, adult ([place])   . Complication of anesthesia 1995   "had hard time waking up; breathing w/vasectomy"  . Coronary artery disease   . Erectile dysfunction   . Essential hypertension, benign   . Fatigue   . GERD (gastroesophageal reflux disease)   . High cholesterol    "just started tx yesterday" (09/06/2017)  . History of gout    "RX prn" (09/06/2017)  . Muscular chest pain   . Obesity   . OSA on CPAP   . Plantar fasciitis   . Right bundle branch block   . Testosterone deficiency   . Type II  diabetes mellitus ([place])    "started tx 08/28/2017"    Past Surgical History:  Procedure Laterality Date  . BACK SURGERY    . CORONARY ANGIOPLASTY WITH STENT PLACEMENT  09/06/2017   "3 stents"  . CORONARY STENT INTERVENTION N/A 09/06/2017   Procedure: CORONARY STENT INTERVENTION;  Surgeon: Jettie Booze, MD;  Location: [facility];  Service: Cardiovascular;  Laterality: N/A;  . CORONARY STENT INTERVENTION N/A 02/15/2018   Procedure: CORONARY STENT INTERVENTION;  Surgeon: Jettie Booze, MD;  Location: [facility];  Service: Cardiovascular;  Laterality: N/A;  . KNEE ARTHROSCOPY Right   . LEFT HEART CATH AND CORONARY ANGIOGRAPHY N/A 09/06/2017   Procedure: LEFT HEART CATH AND CORONARY ANGIOGRAPHY;  Surgeon: Jettie Booze, MD;  Location: [facility];  Service: Cardiovascular;  Laterality: N/A;  . LEFT HEART CATH AND CORONARY ANGIOGRAPHY N/A 02/15/2018   Procedure: LEFT HEART CATH AND CORONARY ANGIOGRAPHY;  Surgeon: Jettie Booze, MD;  Location: [facility];  Service: Cardiovascular;  Laterality: N/A;  . LEFT HEART CATH AND CORONARY ANGIOGRAPHY N/A 06/27/2018   Procedure: LEFT HEART CATH AND CORONARY ANGIOGRAPHY;  Surgeon: Jettie Booze, MD;  Location: [facility];  Service: Cardiovascular;  Laterality: N/A;  . LUMBAR SPINE SURGERY  ~ 2001  Intradiscal Electrothermoplasty  . VASECTOMY  1995    Current Medications: Current Meds  Medication Sig  . allopurinol (ZYLOPRIM) 100 MG tablet Take 100 mg by mouth daily as needed (for gout).   Marland Kitchen aspirin EC 81 MG tablet Take 1 tablet (81 mg total) by mouth daily.  . carvedilol (COREG) 12.5 MG tablet Take 1 tablet (12.5 mg total) by mouth 2 (two) times daily.  . furosemide (LASIX) 40 MG tablet TAKE 1 TABLET BY MOUTH EVERY DAY  . metFORMIN (GLUCOPHAGE-XR) 500 MG 24 hr tablet Take 1-2 tablets (500-1,000 mg total) by mouth See admin instructions. Take 500 mg by mouth in the morning and take  1000 mg by mouth in the evening  . nitroGLYCERIN (NITROSTAT) 0.4 MG SL tablet Place 1 tablet (0.4 mg total) under the tongue every 5 (five) minutes as needed for chest pain.  Marland Kitchen omeprazole (PRILOSEC) 40 MG capsule Take 40 mg by mouth daily.  . Polyethylene Glycol 400 (BLINK TEARS OP) Place 2 drops into both eyes 2 (two) times daily as needed (for dry eyes).  . ranolazine (RANEXA) 500 MG 12 hr tablet Take 500 mg by mouth 2 (two) times daily.  . rosuvastatin (CRESTOR) 40 MG tablet Take 1 tablet (40 mg total) by mouth every evening.  . sacubitril-valsartan (ENTRESTO) 49-51 MG Take 1 tablet by mouth 2 (two) times daily.  Marland Kitchen spironolactone (ALDACTONE) 25 MG tablet Take 0.5 tablets (12.5 mg total) by mouth daily.  . ticagrelor (BRILINTA) 90 MG TABS tablet Take 1 tablet (90 mg total) by mouth 2 (two) times daily.     Allergies:   Patient has no known allergies.   Social History   Socioeconomic History  . Marital status: Married    Spouse name: Not on file  . Number of children: Not on file  . Years of education: Not on file  . Highest education level: Not on file  Occupational History  . Not on file  Social Needs  . Financial resource strain: Not on file  . Food insecurity:    Worry: Not on file    Inability: Not on file  . Transportation needs:    Medical: Not on file    Non-medical: Not on file  Tobacco Use  . Smoking status: Never Smoker  . Smokeless tobacco: Never Used  Substance and Sexual Activity  . Alcohol use: No    Frequency: Never  . Drug use: Never  . Sexual activity: Not Currently  Lifestyle  . Physical activity:    Days per week: Not on file    Minutes per session: Not on file  . Stress: Not on file  Relationships  . Social connections:    Talks on phone: Not on file    Gets together: Not on file    Attends religious service: Not on file    Active member of club or organization: Not on file    Attends meetings of clubs or organizations: Not on file     Relationship status: Not on file  Other Topics Concern  . Not on file  Social History Narrative  . Not on file     Family History: The patient's family history includes Brain cancer in his mother; Diabetes Mellitus II in his brother and paternal grandfather; Lung cancer in his mother; Lymphoma in his father. ROS:   Please see the history of present illness.    All 14 point review of systems negative except as described per history of present illness  EKGs/Labs/Other  Studies Reviewed:      Recent Labs: 03/27/2018: ALT 18 06/25/2018: BUN 19; Creatinine, Ser 1.34; Hemoglobin 14.9; Platelets 266; Potassium 4.6; Sodium 140  Recent Lipid Panel    Component Value Date/Time   CHOL 124 03/27/2018 0940   TRIG 218 (H) 03/27/2018 0940   HDL 35 (L) 03/27/2018 0940   CHOLHDL 3.5 03/27/2018 0940   LDLCALC 45 03/27/2018 0940    Physical Exam:    VS:  BP 132/68   Pulse 64   Ht 6\' 3"  (1.905 m)   Wt (!) 387 lb 6.4 oz (175.7 kg)   SpO2 96%   BMI 48.42 kg/m     Wt Readings from Last 3 Encounters:  07/02/18 (!) 387 lb 6.4 oz (175.7 kg)  06/25/18 (!) 380 lb 9.6 oz (172.6 kg)  06/06/18 (!) 387 lb (175.5 kg)     GEN:  Well nourished, well developed in no acute distress HEENT: Normal NECK: No JVD; No carotid bruits LYMPHATICS: No lymphadenopathy CARDIAC: RRR, no murmurs, no rubs, no gallops RESPIRATORY:  Clear to auscultation without rales, wheezing or rhonchi  ABDOMEN: Soft, non-tender, non-distended MUSCULOSKELETAL:  No edema; No deformity  SKIN: Warm and dry LOWER EXTREMITIES: no swelling NEUROLOGIC:  Alert and oriented x 3 PSYCHIATRIC:  Normal affect   ASSESSMENT:    1. Essential hypertension, benign   2. Acute systolic heart failure ([place])   3. Malaise   4. Fatigue, unspecified type   5. Ischemic cardiomyopathy   6. Shortness of breath    PLAN:    In order of problems listed above:  1. Essential hypertension blood pressure well controlled continue present  management. 2. Congestive heart failure appears to be compensated however in this obese gentleman with difficult to assess it.  Therefore, I will get Chem-7 as well as proBNP 3. Malaise and fatigue multiple factorial there is no documented exercises on the regular basis can help also losing weight can significantly help I will try to see if there is any role for increased diuresis.   Medication Adjustments/Labs and Tests Ordered: Current medicines are reviewed at length with the patient today.  Concerns regarding medicines are outlined above.  Orders Placed This Encounter  Procedures  . Basic metabolic panel  . Pro b natriuretic peptide (BNP)  . B12  . ECHOCARDIOGRAM COMPLETE   Medication changes: No orders of the defined types were placed in this encounter.   Signed, Park Liter, MD, [facility] 07/02/2018 5:21 PM    [place]

## 2018-07-03 ENCOUNTER — Ambulatory Visit (HOSPITAL_COMMUNITY): Payer: BLUE CROSS/BLUE SHIELD

## 2018-07-03 ENCOUNTER — Telehealth: Payer: Self-pay | Admitting: Emergency Medicine

## 2018-07-03 ENCOUNTER — Telehealth (HOSPITAL_COMMUNITY): Payer: Self-pay | Admitting: *Deleted

## 2018-07-03 DIAGNOSIS — I5021 Acute systolic (congestive) heart failure: Secondary | ICD-10-CM

## 2018-07-03 DIAGNOSIS — I10 Essential (primary) hypertension: Secondary | ICD-10-CM

## 2018-07-03 LAB — BASIC METABOLIC PANEL
BUN/Creatinine Ratio: 15 (ref 9–20)
BUN: 18 mg/dL (ref 6–24)
CO2: 24 mmol/L (ref 20–29)
Calcium: 9.1 mg/dL (ref 8.7–10.2)
Chloride: 99 mmol/L (ref 96–106)
Creatinine, Ser: 1.17 mg/dL (ref 0.76–1.27)
GFR calc Af Amer: 85 mL/min/{1.73_m2} (ref >59)
GFR calc non Af Amer: 74 mL/min/{1.73_m2} (ref >59)
Glucose: 159 mg/dL — ABNORMAL HIGH (ref 65–99)
Potassium: 4.5 mmol/L (ref 3.5–5.2)
Sodium: 141 mmol/L (ref 134–144)

## 2018-07-03 LAB — VITAMIN B12: Vitamin B-12: 412 pg/mL (ref 232–1245)

## 2018-07-03 LAB — PRO B NATRIURETIC PEPTIDE: NT-Pro BNP: 143 pg/mL — ABNORMAL HIGH (ref 0–121)

## 2018-07-03 MED ORDER — FUROSEMIDE 40 MG PO TABS: 60.0000 mg | ORAL_TABLET | Freq: Every day | ORAL | 1 refills | Status: DC

## 2018-07-03 NOTE — Telephone Encounter (Signed)
Patient informed of lab results. Informed patient to have labs drawn in 1 week and to increase his lasix to 60 mg daily

## 2018-07-03 NOTE — Telephone Encounter (Signed)
-----   Message from Park Liter, MD sent at 07/03/2018  3:05 PM EST ----- Regarding: RE: Return to cardiac rehab Yes he is ready to return to rehab  Cath showed target lesion  Thanks  Herbie Baltimore  ----- Message ----- From: Magda Kiel, RN Sent: 07/03/2018  12:01 PM EST To: Park Liter, MD Subject: Return to cardiac rehab                        Good afternoon Dr Agustin Cree,  Checking to see if you are okay with Mr Dauzat proceeding with exercise at cardiac rehab? Mr Gazda attended orientation on 06/04/18 and was noted to have frequent ectopy on the day during his walk test.  You saw Mr Bevins for post cath follow up yesterday and had him wear a holter monitor on 06/11/18.   Thank you for your input.   Please let us know if you have any concerns or restrictions.   Sincerely,  Barnet Pall RN Cardiac Rehab [phone]

## 2018-07-04 ENCOUNTER — Telehealth (HOSPITAL_COMMUNITY): Payer: Self-pay | Admitting: *Deleted

## 2018-07-05 ENCOUNTER — Ambulatory Visit (HOSPITAL_COMMUNITY): Payer: BLUE CROSS/BLUE SHIELD

## 2018-07-08 ENCOUNTER — Ambulatory Visit (HOSPITAL_COMMUNITY): Payer: BLUE CROSS/BLUE SHIELD

## 2018-07-10 ENCOUNTER — Ambulatory Visit (HOSPITAL_COMMUNITY): Payer: BLUE CROSS/BLUE SHIELD

## 2018-07-11 ENCOUNTER — Telehealth (HOSPITAL_COMMUNITY): Payer: Self-pay | Admitting: *Deleted

## 2018-07-11 ENCOUNTER — Ambulatory Visit (HOSPITAL_BASED_OUTPATIENT_CLINIC_OR_DEPARTMENT_OTHER)
Admission: RE | Admit: 2018-07-11 | Discharge: 2018-07-11 | Disposition: A | Discharge status: Home / Self Care | Payer: BLUE CROSS/BLUE SHIELD | Source: Ambulatory Visit | Attending: Cardiology | Admitting: Cardiology

## 2018-07-11 DIAGNOSIS — I10 Essential (primary) hypertension: Secondary | ICD-10-CM

## 2018-07-11 DIAGNOSIS — I5021 Acute systolic (congestive) heart failure: Secondary | ICD-10-CM | POA: Diagnosis not present

## 2018-07-11 MED ORDER — PERFLUTREN LIPID MICROSPHERE
1.0000 mL | INTRAVENOUS | Status: AC | PRN
  Administered 2018-07-11: 5 mL via INTRAVENOUS
  Filled 2018-07-11: qty 10

## 2018-07-11 NOTE — Progress Notes (Signed)
  Echocardiogram 2D Echocardiogram has been performed.  Sabah Zucco T Makyle Eslick 07/11/2018, 11:19 AM

## 2018-07-12 ENCOUNTER — Ambulatory Visit (HOSPITAL_COMMUNITY): Payer: BLUE CROSS/BLUE SHIELD

## 2018-07-12 ENCOUNTER — Other Ambulatory Visit: Payer: Self-pay

## 2018-07-12 ENCOUNTER — Encounter (HOSPITAL_BASED_OUTPATIENT_CLINIC_OR_DEPARTMENT_OTHER): Payer: Self-pay | Admitting: *Deleted

## 2018-07-12 ENCOUNTER — Emergency Department (HOSPITAL_BASED_OUTPATIENT_CLINIC_OR_DEPARTMENT_OTHER)
Admission: EM | Admit: 2018-07-12 | Discharge: 2018-07-12 | Disposition: A | Discharge status: Home / Self Care | Payer: BLUE CROSS/BLUE SHIELD | Attending: Emergency Medicine | Admitting: Emergency Medicine

## 2018-07-12 ENCOUNTER — Telehealth: Payer: Self-pay | Admitting: *Deleted

## 2018-07-12 DIAGNOSIS — R197 Diarrhea, unspecified: Secondary | ICD-10-CM | POA: Diagnosis not present

## 2018-07-12 DIAGNOSIS — Z7982 Long term (current) use of aspirin: Secondary | ICD-10-CM | POA: Insufficient documentation

## 2018-07-12 DIAGNOSIS — Z955 Presence of coronary angioplasty implant and graft: Secondary | ICD-10-CM | POA: Diagnosis not present

## 2018-07-12 DIAGNOSIS — I10 Essential (primary) hypertension: Secondary | ICD-10-CM | POA: Diagnosis not present

## 2018-07-12 DIAGNOSIS — I2510 Atherosclerotic heart disease of native coronary artery without angina pectoris: Secondary | ICD-10-CM | POA: Diagnosis not present

## 2018-07-12 DIAGNOSIS — R112 Nausea with vomiting, unspecified: Secondary | ICD-10-CM | POA: Insufficient documentation

## 2018-07-12 DIAGNOSIS — Z7984 Long term (current) use of oral hypoglycemic drugs: Secondary | ICD-10-CM | POA: Insufficient documentation

## 2018-07-12 DIAGNOSIS — E119 Type 2 diabetes mellitus without complications: Secondary | ICD-10-CM | POA: Insufficient documentation

## 2018-07-12 DIAGNOSIS — Z79899 Other long term (current) drug therapy: Secondary | ICD-10-CM | POA: Diagnosis not present

## 2018-07-12 LAB — COMPREHENSIVE METABOLIC PANEL
ALT: 25 U/L (ref 0–44)
AST: 20 U/L (ref 15–41)
Albumin: 3.9 g/dL (ref 3.5–5.0)
Alkaline Phosphatase: 49 U/L (ref 38–126)
Anion gap: 10 (ref 5–15)
BUN: 25 mg/dL — ABNORMAL HIGH (ref 6–20)
CO2: 20 mmol/L — ABNORMAL LOW (ref 22–32)
Calcium: 8.2 mg/dL — ABNORMAL LOW (ref 8.9–10.3)
Chloride: 102 mmol/L (ref 98–111)
Creatinine, Ser: 1.34 mg/dL — ABNORMAL HIGH (ref 0.61–1.24)
GFR calc Af Amer: >60 mL/min (ref >60)
GFR calc non Af Amer: >60 mL/min (ref >60)
Glucose, Bld: 158 mg/dL — ABNORMAL HIGH (ref 70–99)
Potassium: 3.5 mmol/L (ref 3.5–5.1)
Sodium: 132 mmol/L — ABNORMAL LOW (ref 135–145)
Total Bilirubin: 0.8 mg/dL (ref 0.3–1.2)
Total Protein: 7.2 g/dL (ref 6.5–8.1)

## 2018-07-12 LAB — CBC WITH DIFFERENTIAL/PLATELET
Abs Immature Granulocytes: 0.03 10*3/uL (ref 0.00–0.07)
Basophils Absolute: 0 10*3/uL (ref 0.0–0.1)
Basophils Relative: 0 %
Eosinophils Absolute: 0.1 10*3/uL (ref 0.0–0.5)
Eosinophils Relative: 2 %
HCT: 44.1 % (ref 39.0–52.0)
Hemoglobin: 14.8 g/dL (ref 13.0–17.0)
Immature Granulocytes: 0 %
Lymphocytes Relative: 10 %
Lymphs Abs: 0.7 10*3/uL (ref 0.7–4.0)
MCH: 30.6 pg (ref 26.0–34.0)
MCHC: 33.6 g/dL (ref 30.0–36.0)
MCV: 91.3 fL (ref 80.0–100.0)
Monocytes Absolute: 0.8 10*3/uL (ref 0.1–1.0)
Monocytes Relative: 11 %
Neutro Abs: 5.4 10*3/uL (ref 1.7–7.7)
Neutrophils Relative %: 77 %
Platelets: 225 10*3/uL (ref 150–400)
RBC: 4.83 MIL/uL (ref 4.22–5.81)
RDW: 12.3 % (ref 11.5–15.5)
WBC: 7 10*3/uL (ref 4.0–10.5)
nRBC: 0 % (ref 0.0–0.2)

## 2018-07-12 LAB — LIPASE, BLOOD: Lipase: 27 U/L (ref 11–51)

## 2018-07-12 LAB — MAGNESIUM: Magnesium: 2.2 mg/dL (ref 1.7–2.4)

## 2018-07-12 MED ORDER — ONDANSETRON 4 MG PO TBDP: ORAL_TABLET | ORAL | 0 refills | Status: DC

## 2018-07-12 MED ORDER — ONDANSETRON HCL 4 MG/2ML IJ SOLN
4.0000 mg | Freq: Once | INTRAMUSCULAR | Status: AC
  Administered 2018-07-12: 4 mg via INTRAVENOUS
  Filled 2018-07-12: qty 2

## 2018-07-12 MED ORDER — SODIUM CHLORIDE 0.9 % IV BOLUS
1000.0000 mL | Freq: Once | INTRAVENOUS | Status: AC
  Administered 2018-07-12: 1000 mL via INTRAVENOUS

## 2018-07-12 MED FILL — ONDANSETRON ODT 4 MG TABLET: 4 | 2 days supply | Qty: 9 | Fill #0

## 2018-07-12 NOTE — ED Provider Notes (Signed)
[facility] EMERGENCY DEPARTMENT Provider Note   CSN: [id number] Arrival date & time: 07/12/18  7209     History   Chief Complaint Chief Complaint  Patient presents with  . Diarrhea    HPI Joe [name] is a 48 y.o. male.  48 yo M with a chief complaint of nausea vomiting and diarrhea.  This been going on for the past 3 to 4 days.  The patient granddaughter came to visit about a week ago and had a similar illness.  He had a echocardiogram done yesterday and there was some difficulty starting IVs the patient is concerned that he must be dehydrated.  Has been able to tolerate some orals at home.  Has had persistently loose stools.  Thinks it somewhat improved.  Some subjective fevers and chills at the onset of illness but now resolved.  Denies dark or bloody diarrhea.  The history is provided by the patient and the spouse.  Diarrhea  Associated symptoms: vomiting   Associated symptoms: no abdominal pain, no arthralgias, no chills, no fever, no headaches and no myalgias   Illness  Severity:  Moderate Onset quality:  Sudden Duration:  2 days Timing:  Constant Progression:  Worsening Chronicity:  New Associated symptoms: diarrhea, nausea and vomiting   Associated symptoms: no abdominal pain, no chest pain, no congestion, no fever, no headaches, no myalgias, no rash and no shortness of breath     Past Medical History:  Diagnosis Date  . Abnormal EKG   . Body mass index 45.0-49.9, adult ([place])   . Complication of anesthesia 1995   "had hard time waking up; breathing w/vasectomy"  . Coronary artery disease   . Erectile dysfunction   . Essential hypertension, benign   . Fatigue   . GERD (gastroesophageal reflux disease)   . High cholesterol    "just started tx yesterday" (09/06/2017)  . History of gout    "RX prn" (09/06/2017)  . Muscular chest pain   . Obesity   . OSA on CPAP   . Plantar fasciitis   . Right bundle branch block   . Testosterone deficiency   .  Type II diabetes mellitus ([place])    "started tx 08/28/2017"    Patient Active Problem List   Diagnosis Date Noted  . Coronary artery disease   . Pre-operative clearance 02/11/2018  . CAD S/P percutaneous coronary angioplasty 09/07/2017  . Ischemic cardiomyopathy 09/07/2017  . Acute systolic heart failure ([place]) 09/06/2017  . Angina pectoris ([place]) 09/05/2017  . Shortness of breath 09/05/2017  . Type 2 diabetes mellitus with complication, without long-term current use of insulin ([place]) 09/05/2017  . Obstructive sleep apnea   . GERD (gastroesophageal reflux disease)   . Erectile dysfunction   . Essential hypertension, benign   . Obesity   . Testosterone deficiency   . Gout   . Abnormal EKG   . Right bundle branch block     Past Surgical History:  Procedure Laterality Date  . BACK SURGERY    . CORONARY ANGIOPLASTY WITH STENT PLACEMENT  09/06/2017   "3 stents"  . CORONARY STENT INTERVENTION N/A 09/06/2017   Procedure: CORONARY STENT INTERVENTION;  Surgeon: Jettie Booze, MD;  Location: [facility];  Service: Cardiovascular;  Laterality: N/A;  . CORONARY STENT INTERVENTION N/A 02/15/2018   Procedure: CORONARY STENT INTERVENTION;  Surgeon: Jettie Booze, MD;  Location: [facility];  Service: Cardiovascular;  Laterality: N/A;  . KNEE ARTHROSCOPY Right   . LEFT  HEART CATH AND CORONARY ANGIOGRAPHY N/A 09/06/2017   Procedure: LEFT HEART CATH AND CORONARY ANGIOGRAPHY;  Surgeon: Jettie Booze, MD;  Location: [facility] [place] [facility];  Service: Cardiovascular;  Laterality: N/A;  . LEFT HEART CATH AND CORONARY ANGIOGRAPHY N/A 02/15/2018   Procedure: LEFT HEART CATH AND CORONARY ANGIOGRAPHY;  Surgeon: Jettie Booze, MD;  Location: [facility];  Service: Cardiovascular;  Laterality: N/A;  . LEFT HEART CATH AND CORONARY ANGIOGRAPHY N/A 06/27/2018   Procedure: LEFT HEART CATH AND CORONARY ANGIOGRAPHY;  Surgeon: Jettie Booze, MD;  Location: [facility];  Service: Cardiovascular;  Laterality: N/A;  . LUMBAR SPINE SURGERY  ~ 2001   Intradiscal Electrothermoplasty  . [place] Medications    Prior to Admission medications   Medication Sig Start Date End Date Taking? Authorizing Provider  allopurinol (ZYLOPRIM) 100 MG tablet Take 100 mg by mouth daily as needed (for gout).     [provider]  aspirin EC 81 MG tablet Take 1 tablet (81 mg total) by mouth daily. 09/05/17   Park Liter, MD  carvedilol (COREG) 12.5 MG tablet Take 1 tablet (12.5 mg total) by mouth 2 (two) times daily. 03/27/18 07/03/19  Park Liter, MD  furosemide (LASIX) 40 MG tablet Take 1.5 tablets (60 mg total) by mouth daily. 07/03/18   Park Liter, MD  metFORMIN (GLUCOPHAGE-XR) 500 MG 24 hr tablet Take 1-2 tablets (500-1,000 mg total) by mouth See admin instructions. Take 500 mg by mouth in the morning and take 1000 mg by mouth in the evening 06/29/18   Jettie Booze, MD  nitroGLYCERIN (NITROSTAT) 0.4 MG SL tablet Place 1 tablet (0.4 mg total) under the tongue every 5 (five) minutes as needed for chest pain. 09/05/17 03/28/19  Park Liter, MD  omeprazole (PRILOSEC) 40 MG capsule Take 40 mg by mouth daily.    [provider]  ondansetron (ZOFRAN ODT) 4 MG disintegrating tablet 4mg  ODT q4 hours prn nausea/vomit 07/12/18   Deno Etienne, DO  Polyethylene Glycol 400 (BLINK TEARS OP) Place 2 drops into both eyes 2 (two) times daily as needed (for dry eyes).    [provider]  ranolazine (RANEXA) 500 MG 12 hr tablet Take 500 mg by mouth 2 (two) times daily.    [provider]  rosuvastatin (CRESTOR) 40 MG tablet Take 1 tablet (40 mg total) by mouth every evening. 09/05/17 03/28/19  Park Liter, MD  sacubitril-valsartan (ENTRESTO) 49-51 MG Take 1 tablet by mouth 2 (two) times daily. 09/14/17   Park Liter, MD  spironolactone (ALDACTONE) 25 MG tablet Take 0.5 tablets (12.5 mg total) by  mouth daily. 09/08/17   Leanor Kail, PA  ticagrelor (BRILINTA) 90 MG TABS tablet Take 1 tablet (90 mg total) by mouth 2 (two) times daily. 05/13/18   Park Liter, MD    Family History Family History  Problem Relation Age of Onset  . Lymphoma Father   . Diabetes Mellitus II Brother   . Diabetes Mellitus II Paternal Grandfather   . Lung cancer Mother   . Brain cancer Mother     Social History Social History   Tobacco Use  . Smoking status: Never Smoker  . Smokeless tobacco: Never Used  Substance Use Topics  . Alcohol use: No    Frequency: Never  . Drug use: Never     Allergies   Patient has no known allergies.  Review of Systems Review of Systems  Constitutional: Negative for chills and fever.  HENT: Negative for congestion and facial swelling.   Eyes: Negative for discharge and visual disturbance.  Respiratory: Negative for shortness of breath.   Cardiovascular: Negative for chest pain and palpitations.  Gastrointestinal: Positive for diarrhea, nausea and vomiting. Negative for abdominal pain.  Musculoskeletal: Negative for arthralgias and myalgias.  Skin: Negative for color change and rash.  Neurological: Negative for tremors, syncope and headaches.  Psychiatric/Behavioral: Negative for confusion and dysphoric mood.     Physical Exam Updated Vital Signs BP 102/70 (BP Location: Left Arm)   Pulse 88   Temp 98 F (36.7 C) (Oral)   Resp 18   Ht 6\' 4"  (1.93 m)   Wt (!) 166 kg   SpO2 98%   BMI 44.55 kg/m   Physical Exam Vitals signs and nursing note reviewed.  Constitutional:      Appearance: He is well-developed. He is obese.  HENT:     Head: Normocephalic and atraumatic.  Eyes:     Pupils: Pupils are equal, round, and reactive to light.  Neck:     Musculoskeletal: Normal range of motion and neck supple.     Vascular: No JVD.  Cardiovascular:     Rate and Rhythm: Normal rate and regular rhythm.     Heart sounds: No murmur. No friction  rub. No gallop.   Pulmonary:     Effort: No respiratory distress.     Breath sounds: No wheezing.  Abdominal:     General: There is distension (chronic per family).     Tenderness: There is no abdominal tenderness. There is no guarding or rebound.  Musculoskeletal: Normal range of motion.  Skin:    Coloration: Skin is not pale.     Findings: No rash.  Neurological:     Mental Status: He is alert and oriented to person, place, and time.  Psychiatric:        Behavior: Behavior normal.      ED Treatments / Results  Labs (all labs ordered are listed, but only abnormal results are displayed) Labs Reviewed  COMPREHENSIVE METABOLIC PANEL - Abnormal; Notable for the following components:      Result Value   Sodium 132 (*)    CO2 20 (*)    Glucose, Bld 158 (*)    BUN 25 (*)    Creatinine, Ser 1.34 (*)    Calcium 8.2 (*)    All other components within normal limits  CBC WITH DIFFERENTIAL/PLATELET  LIPASE, BLOOD  MAGNESIUM    EKG None  Radiology No results found.  Procedures Procedures (including critical care time)  Medications Ordered in ED Medications  sodium chloride 0.9 % bolus 1,000 mL ( Intravenous Stopped 07/12/18 0900)  ondansetron (ZOFRAN) injection 4 mg (4 mg Intravenous Given 07/12/18 0831)     Initial Impression / Assessment and Plan / ED Course  I have reviewed the triage vital signs and the nursing notes.  Pertinent labs & imaging results that were available during my care of the patient were reviewed by me and considered in my medical decision making (see chart for details).     48 yo M with a chief complaint of nausea vomiting diarrhea.  The patient is well-appearing nontoxic.  No abdominal tenderness.  Will check labs give a bolus of IV fluids.  Patient's laboratory evaluation without significant electrolyte abnormality.  Tolerating p.o. here.  Discharge home.  2:16 PM:  I have discussed the diagnosis/risks/treatment  options with the patient and  family and believe the pt to be eligible for discharge home to follow-up with PCP. We also discussed returning to the ED immediately if new or worsening sx occur. We discussed the sx which are most concerning (e.g., sudden worsening pain, fever, inability to tolerate by mouth) that necessitate immediate return. Medications administered to the patient during their visit and any new prescriptions provided to the patient are listed below.  Medications given during this visit Medications  sodium chloride 0.9 % bolus 1,000 mL ( Intravenous Stopped 07/12/18 0900)  ondansetron (ZOFRAN) injection 4 mg (4 mg Intravenous Given 07/12/18 0831)     The patient appears reasonably screen and/or stabilized for discharge and I doubt any other medical condition or other [facility] requiring further screening, evaluation, or treatment in the ED at this time prior to discharge.    Final Clinical Impressions(s) / ED Diagnoses   Final diagnoses:  Nausea vomiting and diarrhea    ED Discharge Orders         Ordered    ondansetron (ZOFRAN ODT) 4 MG disintegrating tablet     07/12/18 0859           Deno Etienne, DO 07/12/18 1416

## 2018-07-12 NOTE — Telephone Encounter (Signed)
Patient went to the emergency department here at the [facility] due to nausea, vomiting, and diarrhea. Patient reports that he has a virus/bug. The ED doctor asked that he stop by our office to ask if he should continue taking lasix as prescribed or reduce his dose since he was dehydrated when he presented to the ED.  Spoke with Dr. Bettina Gavia who advised patient since patient does not weigh himself every day to skip today's dose of lasix and then take lasix 40 mg daily until his sickness has resolved. At that time, patient will continue taking lasix 60 mg daily as prescribed. Patient verbalized understanding.   Patient informed of echocardiogram results and advised that Dr. Agustin Cree will review this report when he returns to the office next week and advise on a plan of treatment as far as medication changes and a possible referral to the heart failure clinic. Patient verbalized understanding. No further questions.

## 2018-07-12 NOTE — Discharge Instructions (Signed)
Try imodium for diarrhea.  Take the nausea meds as needed.  Return for worsening abdominal pain, fever, inability to eat or drink.   Try the brat diet, bananas rice applesauce and toast.  This is considered to be gentle in your stomach.

## 2018-07-12 NOTE — ED Triage Notes (Signed)
abd cramping and chills onset Tuesday  Wednesday onset diarrhea and vomited x 1 llast pm   And this am x 1

## 2018-07-15 ENCOUNTER — Ambulatory Visit (HOSPITAL_COMMUNITY): Payer: BLUE CROSS/BLUE SHIELD

## 2018-07-17 ENCOUNTER — Ambulatory Visit (HOSPITAL_COMMUNITY): Payer: BLUE CROSS/BLUE SHIELD

## 2018-07-19 ENCOUNTER — Ambulatory Visit (HOSPITAL_COMMUNITY): Payer: BLUE CROSS/BLUE SHIELD

## 2018-07-22 ENCOUNTER — Ambulatory Visit (HOSPITAL_COMMUNITY): Payer: BLUE CROSS/BLUE SHIELD

## 2018-07-24 ENCOUNTER — Ambulatory Visit (HOSPITAL_COMMUNITY): Payer: BLUE CROSS/BLUE SHIELD

## 2018-07-25 ENCOUNTER — Other Ambulatory Visit (HOSPITAL_COMMUNITY): Payer: Self-pay | Admitting: Physician Assistant

## 2018-07-26 ENCOUNTER — Ambulatory Visit (HOSPITAL_COMMUNITY): Payer: BLUE CROSS/BLUE SHIELD

## 2018-07-26 ENCOUNTER — Other Ambulatory Visit (HOSPITAL_COMMUNITY): Payer: Self-pay | Admitting: Physician Assistant

## 2018-07-29 ENCOUNTER — Ambulatory Visit (HOSPITAL_COMMUNITY): Payer: BLUE CROSS/BLUE SHIELD

## 2018-07-30 ENCOUNTER — Ambulatory Visit: Payer: BLUE CROSS/BLUE SHIELD | Admitting: Cardiology

## 2018-07-31 ENCOUNTER — Ambulatory Visit (HOSPITAL_COMMUNITY): Payer: BLUE CROSS/BLUE SHIELD

## 2018-08-02 ENCOUNTER — Ambulatory Visit (HOSPITAL_COMMUNITY): Payer: BLUE CROSS/BLUE SHIELD

## 2018-08-05 ENCOUNTER — Ambulatory Visit (HOSPITAL_COMMUNITY): Payer: BLUE CROSS/BLUE SHIELD

## 2018-08-07 ENCOUNTER — Other Ambulatory Visit: Payer: Self-pay | Admitting: Cardiology

## 2018-08-07 ENCOUNTER — Ambulatory Visit (HOSPITAL_COMMUNITY): Payer: BLUE CROSS/BLUE SHIELD

## 2018-08-09 ENCOUNTER — Ambulatory Visit (HOSPITAL_COMMUNITY): Payer: BLUE CROSS/BLUE SHIELD

## 2018-08-12 ENCOUNTER — Ambulatory Visit (HOSPITAL_COMMUNITY): Payer: BLUE CROSS/BLUE SHIELD

## 2018-08-14 ENCOUNTER — Ambulatory Visit (HOSPITAL_COMMUNITY): Payer: BLUE CROSS/BLUE SHIELD

## 2018-08-16 ENCOUNTER — Ambulatory Visit (HOSPITAL_COMMUNITY): Payer: BLUE CROSS/BLUE SHIELD

## 2018-08-19 ENCOUNTER — Ambulatory Visit (HOSPITAL_COMMUNITY): Payer: BLUE CROSS/BLUE SHIELD

## 2018-08-21 ENCOUNTER — Ambulatory Visit (HOSPITAL_COMMUNITY): Payer: BLUE CROSS/BLUE SHIELD

## 2018-08-23 ENCOUNTER — Ambulatory Visit (HOSPITAL_COMMUNITY): Payer: BLUE CROSS/BLUE SHIELD

## 2018-08-26 ENCOUNTER — Ambulatory Visit (HOSPITAL_COMMUNITY): Payer: BLUE CROSS/BLUE SHIELD

## 2018-08-28 ENCOUNTER — Ambulatory Visit (HOSPITAL_COMMUNITY): Payer: BLUE CROSS/BLUE SHIELD

## 2018-08-30 ENCOUNTER — Ambulatory Visit (HOSPITAL_COMMUNITY): Payer: BLUE CROSS/BLUE SHIELD

## 2018-09-02 ENCOUNTER — Other Ambulatory Visit: Payer: Self-pay

## 2018-09-02 ENCOUNTER — Ambulatory Visit (HOSPITAL_COMMUNITY): Payer: BLUE CROSS/BLUE SHIELD

## 2018-09-02 ENCOUNTER — Encounter: Payer: Self-pay | Admitting: Cardiology

## 2018-09-02 ENCOUNTER — Ambulatory Visit (INDEPENDENT_AMBULATORY_CARE_PROVIDER_SITE_OTHER): Payer: BLUE CROSS/BLUE SHIELD | Admitting: Cardiology

## 2018-09-02 VITALS — BP 120/60 | HR 82 | Ht 76.0 in | Wt 384.0 lb

## 2018-09-02 DIAGNOSIS — R0602 Shortness of breath: Secondary | ICD-10-CM

## 2018-09-02 DIAGNOSIS — I5021 Acute systolic (congestive) heart failure: Secondary | ICD-10-CM | POA: Diagnosis not present

## 2018-09-02 DIAGNOSIS — Z9861 Coronary angioplasty status: Secondary | ICD-10-CM

## 2018-09-02 DIAGNOSIS — I2510 Atherosclerotic heart disease of native coronary artery without angina pectoris: Secondary | ICD-10-CM | POA: Diagnosis not present

## 2018-09-02 DIAGNOSIS — G4733 Obstructive sleep apnea (adult) (pediatric): Secondary | ICD-10-CM

## 2018-09-02 DIAGNOSIS — I255 Ischemic cardiomyopathy: Secondary | ICD-10-CM

## 2018-09-02 NOTE — Progress Notes (Signed)
Cardiology Office Note:    Date:  09/02/2018   ID:  Joe [name], DOB [birth year], MRN [id number]  PCP:  Cyndi Bender, PA-C  Cardiologist:  Jenne Campus, MD    Referring MD: Cyndi Bender, PA-C   Chief Complaint  Patient presents with  . Follow-up  Short of breath  History of Present Illness:    Joe [name] is a 48 y.o. male with complex past medical history which include congestive heart failure, coronary artery disease frequent cardiac catheterizations.  Comes today to my office for follow-up of all he complained of being weak tired exhausted.  Also short of breath.  There is no swelling of lower extremities.  He did not check his weight.  Past Medical History:  Diagnosis Date  . Abnormal EKG   . Body mass index 45.0-49.9, adult ([place])   . Complication of anesthesia 1995   "had hard time waking up; breathing w/vasectomy"  . Coronary artery disease   . Erectile dysfunction   . Essential hypertension, benign   . Fatigue   . GERD (gastroesophageal reflux disease)   . High cholesterol    "just started tx yesterday" (09/06/2017)  . History of gout    "RX prn" (09/06/2017)  . Muscular chest pain   . Obesity   . OSA on CPAP   . Plantar fasciitis   . Right bundle branch block   . Testosterone deficiency   . Type II diabetes mellitus ([place])    "started tx 08/28/2017"    Past Surgical History:  Procedure Laterality Date  . BACK SURGERY    . CORONARY ANGIOPLASTY WITH STENT PLACEMENT  09/06/2017   "3 stents"  . CORONARY STENT INTERVENTION N/A 09/06/2017   Procedure: CORONARY STENT INTERVENTION;  Surgeon: Jettie Booze, MD;  Location: [facility];  Service: Cardiovascular;  Laterality: N/A;  . CORONARY STENT INTERVENTION N/A 02/15/2018   Procedure: CORONARY STENT INTERVENTION;  Surgeon: Jettie Booze, MD;  Location: [facility];  Service: Cardiovascular;  Laterality: N/A;  . KNEE ARTHROSCOPY Right   . LEFT HEART CATH AND CORONARY ANGIOGRAPHY  N/A 09/06/2017   Procedure: LEFT HEART CATH AND CORONARY ANGIOGRAPHY;  Surgeon: Jettie Booze, MD;  Location: [facility];  Service: Cardiovascular;  Laterality: N/A;  . LEFT HEART CATH AND CORONARY ANGIOGRAPHY N/A 02/15/2018   Procedure: LEFT HEART CATH AND CORONARY ANGIOGRAPHY;  Surgeon: Jettie Booze, MD;  Location: [facility];  Service: Cardiovascular;  Laterality: N/A;  . LEFT HEART CATH AND CORONARY ANGIOGRAPHY N/A 06/27/2018   Procedure: LEFT HEART CATH AND CORONARY ANGIOGRAPHY;  Surgeon: Jettie Booze, MD;  Location: [facility];  Service: Cardiovascular;  Laterality: N/A;  . LUMBAR SPINE SURGERY  ~ 2001   Intradiscal Electrothermoplasty  . VASECTOMY  1995    Current Medications: Current Meds  Medication Sig  . allopurinol (ZYLOPRIM) 100 MG tablet Take 100 mg by mouth daily as needed (for gout).   Marland Kitchen aspirin EC 81 MG tablet Take 1 tablet (81 mg total) by mouth daily.  . carvedilol (COREG) 12.5 MG tablet Take 1 tablet (12.5 mg total) by mouth 2 (two) times daily.  . furosemide (LASIX) 40 MG tablet TAKE 1 TABLET BY MOUTH EVERY DAY (Patient taking differently: 60 mg. )  . metFORMIN (GLUCOPHAGE-XR) 500 MG 24 hr tablet Take 1-2 tablets (500-1,000 mg total) by mouth See admin instructions. Take 500 mg by mouth in the morning and take 1000 mg by mouth in the  evening  . nitroGLYCERIN (NITROSTAT) 0.4 MG SL tablet Place 1 tablet (0.4 mg total) under the tongue every 5 (five) minutes as needed for chest pain.  Marland Kitchen omeprazole (PRILOSEC) 40 MG capsule Take 40 mg by mouth daily.  . ondansetron (ZOFRAN ODT) 4 MG disintegrating tablet 4mg  ODT q4 hours prn nausea/vomit  . Polyethylene Glycol 400 (BLINK TEARS OP) Place 2 drops into both eyes 2 (two) times daily as needed (for dry eyes).  . ranolazine (RANEXA) 500 MG 12 hr tablet Take 500 mg by mouth 2 (two) times daily.  . rosuvastatin (CRESTOR) 40 MG tablet TAKE 1 TABLET BY MOUTH EVERY DAY IN THE EVENING  .  sacubitril-valsartan (ENTRESTO) 49-51 MG Take 1 tablet by mouth 2 (two) times daily.  Marland Kitchen spironolactone (ALDACTONE) 25 MG tablet Take 0.5 tablets (12.5 mg total) by mouth daily.  . ticagrelor (BRILINTA) 90 MG TABS tablet Take 1 tablet (90 mg total) by mouth 2 (two) times daily.     Allergies:   Patient has no known allergies.   Social History   Socioeconomic History  . Marital status: Married    Spouse name: Not on file  . Number of children: Not on file  . Years of education: Not on file  . Highest education level: Not on file  Occupational History  . Not on file  Social Needs  . Financial resource strain: Not on file  . Food insecurity:    Worry: Not on file    Inability: Not on file  . Transportation needs:    Medical: Not on file    Non-medical: Not on file  Tobacco Use  . Smoking status: Never Smoker  . Smokeless tobacco: Never Used  Substance and Sexual Activity  . Alcohol use: No    Frequency: Never  . Drug use: Never  . Sexual activity: Not Currently  Lifestyle  . Physical activity:    Days per week: Not on file    Minutes per session: Not on file  . Stress: Not on file  Relationships  . Social connections:    Talks on phone: Not on file    Gets together: Not on file    Attends religious service: Not on file    Active member of club or organization: Not on file    Attends meetings of clubs or organizations: Not on file    Relationship status: Not on file  Other Topics Concern  . Not on file  Social History Narrative  . Not on file     Family History: The patient's family history includes Brain cancer in his mother; Diabetes Mellitus II in his brother and paternal grandfather; Lung cancer in his mother; Lymphoma in his father. ROS:   Please see the history of present illness.    All 14 point review of systems negative except as described per history of present illness  EKGs/Labs/Other Studies Reviewed:      Recent Labs: 07/02/2018: NT-Pro BNP 143  07/12/2018: ALT 25; BUN 25; Creatinine, Ser 1.34; Hemoglobin 14.8; Magnesium 2.2; Platelets 225; Potassium 3.5; Sodium 132  Recent Lipid Panel    Component Value Date/Time   CHOL 124 03/27/2018 0940   TRIG 218 (H) 03/27/2018 0940   HDL 35 (L) 03/27/2018 0940   CHOLHDL 3.5 03/27/2018 0940   LDLCALC 45 03/27/2018 0940    Physical Exam:    VS:  BP 120/60   Pulse 82   Ht 6\' 4"  (1.93 m)   Wt (!) 384 lb (174.2 kg)  SpO2 95%   BMI 46.74 kg/m     Wt Readings from Last 3 Encounters:  09/02/18 (!) 384 lb (174.2 kg)  07/12/18 (!) 366 lb (166 kg)  07/02/18 (!) 387 lb 6.4 oz (175.7 kg)     GEN:  Well nourished, well developed in no acute distress HEENT: Normal NECK: No JVD; No carotid bruits LYMPHATICS: No lymphadenopathy CARDIAC: RRR, no murmurs, no rubs, no gallops RESPIRATORY:  Clear to auscultation without rales, wheezing or rhonchi  ABDOMEN: Soft, non-tender, non-distended MUSCULOSKELETAL:  No edema; No deformity  SKIN: Warm and dry LOWER EXTREMITIES: no swelling NEUROLOGIC:  Alert and oriented x 3 PSYCHIATRIC:  Normal affect   ASSESSMENT:    1. Acute systolic heart failure ([place])   2. Ischemic cardiomyopathy   3. CAD S/P percutaneous coronary angioplasty   4. Shortness of breath   5. Obstructive sleep apnea    PLAN:    In order of problems listed above:  1. Systolic congestive heart failure I will check his proBNP as well as Chem-7 it is always difficult issue trying to determine if this is because of his weight if this is because of his congestive heart failure or lung condition.  Hopefully looking at proBNP will allow [place] to distinguish those 2 conditions.  Treatment will be initiated after that.  Anticipate need to increase the diuretic. 2. Coronary artery disease denies having any new symptoms or new problems. 3. Obstructive sleep apnea using CPAP mask.   Medication Adjustments/Labs and Tests Ordered: Current medicines are reviewed at length with the patient  today.  Concerns regarding medicines are outlined above.  Orders Placed This Encounter  Procedures  . Basic metabolic panel  . Brain natriuretic peptide   Medication changes: No orders of the defined types were placed in this encounter.   Signed, Park Liter, MD, [facility] 09/02/2018 4:51 PM    [facility]

## 2018-09-02 NOTE — Patient Instructions (Signed)
Medication Instructions:  Your physician recommends that you continue on your current medications as directed. Please refer to the Current Medication list given to you today.  If you need a refill on your cardiac medications before your next appointment, please call your pharmacy.   Lab work: Your physician recommends that you have the following labs drawn: BNP and BMP  If you have labs (blood work) drawn today and your tests are completely normal, you will receive your results only by: Marland Kitchen MyChart Message (if you have MyChart) OR . A paper copy in the mail If you have any lab test that is abnormal or we need to change your treatment, we will call you to review the results.  Testing/Procedures: None ordered  Follow-Up: At [facility], you and your health needs are our priority.  As part of our continuing mission to provide you with exceptional heart care, we have created designated Provider Care Teams.  These Care Teams include your primary Cardiologist (physician) and Advanced Practice Providers (APPs -  Physician Assistants and Nurse Practitioners) who all work together to provide you with the care you need, when you need it. You will need a follow up appointment in 1 months.   You may see Jenne Campus, MD or another member of our [facility] Provider Team in [place]: Shirlee More, MD . Jyl Heinz, MD

## 2018-09-03 ENCOUNTER — Other Ambulatory Visit: Payer: Self-pay

## 2018-09-03 LAB — BASIC METABOLIC PANEL
BUN/Creatinine Ratio: 11 (ref 9–20)
BUN: 10 mg/dL (ref 6–24)
CO2: 26 mmol/L (ref 20–29)
Calcium: 9.2 mg/dL (ref 8.7–10.2)
Chloride: 98 mmol/L (ref 96–106)
Creatinine, Ser: 0.88 mg/dL (ref 0.76–1.27)
GFR calc Af Amer: 118 mL/min/{1.73_m2} (ref >59)
GFR calc non Af Amer: 102 mL/min/{1.73_m2} (ref >59)
Glucose: 171 mg/dL — ABNORMAL HIGH (ref 65–99)
Potassium: 4.1 mmol/L (ref 3.5–5.2)
Sodium: 141 mmol/L (ref 134–144)

## 2018-09-03 LAB — BRAIN NATRIURETIC PEPTIDE: BNP: 113.5 pg/mL — ABNORMAL HIGH (ref 0.0–100.0)

## 2018-09-03 MED ORDER — FUROSEMIDE 40 MG PO TABS: 40.0000 mg | ORAL_TABLET | Freq: Every day | ORAL | 1 refills | Status: DC

## 2018-09-04 ENCOUNTER — Ambulatory Visit (HOSPITAL_COMMUNITY): Payer: BLUE CROSS/BLUE SHIELD

## 2018-09-04 ENCOUNTER — Telehealth: Payer: Self-pay | Admitting: Emergency Medicine

## 2018-09-04 DIAGNOSIS — I10 Essential (primary) hypertension: Secondary | ICD-10-CM

## 2018-09-04 MED ORDER — FUROSEMIDE 40 MG PO TABS: 80.0000 mg | ORAL_TABLET | Freq: Every day | ORAL | 1 refills | Status: DC

## 2018-09-04 MED ORDER — POTASSIUM CHLORIDE ER 20 MEQ PO TBCR: 20.0000 meq | EXTENDED_RELEASE_TABLET | Freq: Every day | ORAL | 1 refills | Status: DC

## 2018-09-04 NOTE — Telephone Encounter (Signed)
Patient informed of lab results. Patient advised to increase lasix to 80 mg daily and start potassium 20 meq daily. Patient also advised to have lab work redrawn in 1 week. Patient verbally understands. No further questions.

## 2018-09-06 ENCOUNTER — Ambulatory Visit (HOSPITAL_COMMUNITY): Payer: BLUE CROSS/BLUE SHIELD

## 2018-09-09 ENCOUNTER — Ambulatory Visit (HOSPITAL_COMMUNITY): Payer: BLUE CROSS/BLUE SHIELD

## 2018-09-11 ENCOUNTER — Ambulatory Visit (HOSPITAL_COMMUNITY): Payer: BLUE CROSS/BLUE SHIELD

## 2018-09-27 ENCOUNTER — Other Ambulatory Visit: Payer: Self-pay | Admitting: Physician Assistant

## 2018-09-30 ENCOUNTER — Telehealth: Payer: Self-pay | Admitting: Emergency Medicine

## 2018-09-30 NOTE — Telephone Encounter (Signed)
Left message for patient to return call regarding appt confirming a televisit on Thursday.

## 2018-10-01 NOTE — Telephone Encounter (Signed)
Patient called back, confirmed appointment as a televisit. Will send consent to patients mychart.

## 2018-10-03 ENCOUNTER — Telehealth (INDEPENDENT_AMBULATORY_CARE_PROVIDER_SITE_OTHER): Payer: BLUE CROSS/BLUE SHIELD | Admitting: Cardiology

## 2018-10-03 ENCOUNTER — Encounter: Payer: Self-pay | Admitting: Cardiology

## 2018-10-03 ENCOUNTER — Other Ambulatory Visit: Payer: Self-pay

## 2018-10-03 VITALS — Wt 373.0 lb

## 2018-10-03 DIAGNOSIS — I209 Angina pectoris, unspecified: Secondary | ICD-10-CM

## 2018-10-03 DIAGNOSIS — I255 Ischemic cardiomyopathy: Secondary | ICD-10-CM | POA: Diagnosis not present

## 2018-10-03 DIAGNOSIS — E118 Type 2 diabetes mellitus with unspecified complications: Secondary | ICD-10-CM

## 2018-10-03 DIAGNOSIS — R0602 Shortness of breath: Secondary | ICD-10-CM

## 2018-10-03 DIAGNOSIS — I10 Essential (primary) hypertension: Secondary | ICD-10-CM

## 2018-10-03 MED ORDER — OMEPRAZOLE 40 MG PO CPDR: 40.0000 mg | DELAYED_RELEASE_CAPSULE | Freq: Every day | ORAL | 1 refills | Status: DC

## 2018-10-03 NOTE — Patient Instructions (Signed)
Medication Instructions:  Your physician recommends that you continue on your current medications as directed. Please refer to the Current Medication list given to you today.  If you need a refill on your cardiac medications before your next appointment, please call your pharmacy.   Lab work: None.  If you have labs (blood work) drawn today and your tests are completely normal, you will receive your results only by: . MyChart Message (if you have MyChart) OR . A paper copy in the mail If you have any lab test that is abnormal or we need to change your treatment, we will call you to review the results.  Testing/Procedures: None.   Follow-Up: At [facility], you and your health needs are our priority.  As part of our continuing mission to provide you with exceptional heart care, we have created designated Provider Care Teams.  These Care Teams include your primary Cardiologist (physician) and Advanced Practice Providers (APPs -  Physician Assistants and Nurse Practitioners) who all work together to provide you with the care you need, when you need it. You will need a follow up appointment in 2 months.  Please call our office 2 months in advance to schedule this appointment.  You may see Robert Krasowski, MD or another member of our [facility] Provider Team in [place]: Brian Munley, MD . Rajan Revankar, MD  Any Other Special Instructions Will Be Listed Below (If Applicable).     

## 2018-10-04 NOTE — Progress Notes (Signed)
Virtual Visit via Telephone Note   This visit type was conducted due to national recommendations for restrictions regarding the COVID-19 Pandemic (e.g. social distancing) in an effort to limit this patient's exposure and mitigate transmission in our community.  Due to his co-morbid illnesses, this patient is at least at moderate risk for complications without adequate follow up.  This format is felt to be most appropriate for this patient at this time.  The patient did not have access to video technology/had technical difficulties with video requiring transitioning to audio format only (telephone).  All issues noted in this document were discussed and addressed.  No physical exam could be performed with this format.  Please refer to the patient's chart for his  consent to telehealth for [facility].  Evaluation Performed:  Follow-up visit  This visit type was conducted due to national recommendations for restrictions regarding the COVID-19 Pandemic (e.g. social distancing).  This format is felt to be most appropriate for this patient at this time.  All issues noted in this document were discussed and addressed.  No physical exam was performed (except for noted visual exam findings with Video Visits).  Please refer to the patient's chart (MyChart message for video visits and phone note for telephone visits) for the patient's consent to telehealth for [facility].  Date:  10/04/2018  ID: Joe [name], DOB 11-18-1970, MRN [id number]   Patient Location: [place] [address]   Provider location:   [place] Office  PCP:  Cyndi Bender, PA-C  Cardiologist:  Jenne Campus, MD     Chief Complaint: Doing well  History of Present Illness:    Joe [name] is a 48 y.o. male  who presents via audio/video conferencing for a telehealth visit today.  With cardiomyopathy, coronary artery disease, recent cardiac catheterization showed nonobstructive lesions.  He  has been struggling with exertional shortness of breath.  I think significant reasons for his problem is his significant weight he had difficulty losing.  On talking to him today to the phone.  He actually forgot about our visits and he was driving his car when we called him.  He had to pull over and then we had a conversation.  Overall he said he is feeling better he is less shortness of breath but still complain of being tired weak and exhausted.   The patient does not have symptoms concerning for COVID-19 infection (fever, chills, cough, or new SHORTNESS OF BREATH).    Prior CV studies:   The following studies were reviewed today:       Past Medical History:  Diagnosis Date   Abnormal EKG    Body mass index 45.0-49.9, adult (HCC)    Complication of anesthesia 1995   "had hard time waking up; breathing w/vasectomy"   Coronary artery disease    Erectile dysfunction    Essential hypertension, benign    Fatigue    GERD (gastroesophageal reflux disease)    High cholesterol    "just started tx yesterday" (09/06/2017)   History of gout    "RX prn" (09/06/2017)   Muscular chest pain    Obesity    OSA on CPAP    Plantar fasciitis    Right bundle branch block    Testosterone deficiency    Type II diabetes mellitus ([place])    "started tx 08/28/2017"    Past Surgical History:  Procedure Laterality Date   BACK SURGERY     CORONARY ANGIOPLASTY WITH STENT  PLACEMENT  09/06/2017   "3 stents"   CORONARY STENT INTERVENTION N/A 09/06/2017   Procedure: CORONARY STENT INTERVENTION;  Surgeon: Jettie Booze, MD;  Location: [place] [facility];  Service: Cardiovascular;  Laterality: N/A;   CORONARY STENT INTERVENTION N/A 02/15/2018   Procedure: CORONARY STENT INTERVENTION;  Surgeon: Jettie Booze, MD;  Location: [place] [facility];  Service: Cardiovascular;  Laterality: N/A;   KNEE ARTHROSCOPY Right    LEFT HEART CATH AND CORONARY ANGIOGRAPHY N/A 09/06/2017    Procedure: LEFT HEART CATH AND CORONARY ANGIOGRAPHY;  Surgeon: Jettie Booze, MD;  Location: [place] [facility];  Service: Cardiovascular;  Laterality: N/A;   LEFT HEART CATH AND CORONARY ANGIOGRAPHY N/A 02/15/2018   Procedure: LEFT HEART CATH AND CORONARY ANGIOGRAPHY;  Surgeon: Jettie Booze, MD;  Location: [place] [facility];  Service: Cardiovascular;  Laterality: N/A;   LEFT HEART CATH AND CORONARY ANGIOGRAPHY N/A 06/27/2018   Procedure: LEFT HEART CATH AND CORONARY ANGIOGRAPHY;  Surgeon: Jettie Booze, MD;  Location: [place] [facility];  Service: Cardiovascular;  Laterality: N/A;   LUMBAR SPINE SURGERY  ~ 2001   Intradiscal Electrothermoplasty   VASECTOMY  1995     Current Meds  Medication Sig   allopurinol (ZYLOPRIM) 100 MG tablet Take 100 mg by mouth daily as needed (for gout).    aspirin EC 81 MG tablet Take 1 tablet (81 mg total) by mouth daily.   carvedilol (COREG) 12.5 MG tablet Take 1 tablet (12.5 mg total) by mouth 2 (two) times daily.   furosemide (LASIX) 40 MG tablet Take 2 tablets (80 mg total) by mouth daily.   metFORMIN (GLUCOPHAGE-XR) 500 MG 24 hr tablet Take 1-2 tablets (500-1,000 mg total) by mouth See admin instructions. Take 500 mg by mouth in the morning and take 1000 mg by mouth in the evening   nitroGLYCERIN (NITROSTAT) 0.4 MG SL tablet Place 1 tablet (0.4 mg total) under the tongue every 5 (five) minutes as needed for chest pain.   omeprazole (PRILOSEC) 40 MG capsule Take 1 capsule (40 mg total) by mouth daily.   ondansetron (ZOFRAN ODT) 4 MG disintegrating tablet 4mg  ODT q4 hours prn nausea/vomit   Polyethylene Glycol 400 (BLINK TEARS OP) Place 2 drops into both eyes 2 (two) times daily as needed (for dry eyes).   potassium chloride 20 MEQ TBCR Take 20 mEq by mouth daily.   ranolazine (RANEXA) 500 MG 12 hr tablet Take 500 mg by mouth 2 (two) times daily.   rosuvastatin (CRESTOR) 40 MG tablet TAKE 1 TABLET BY MOUTH EVERY DAY IN THE  EVENING   sacubitril-valsartan (ENTRESTO) 49-51 MG Take 1 tablet by mouth 2 (two) times daily.   spironolactone (ALDACTONE) 25 MG tablet TAKE 0.5 TABLETS BY MOUTH EVERY DAY   ticagrelor (BRILINTA) 90 MG TABS tablet Take 1 tablet (90 mg total) by mouth 2 (two) times daily.   [DISCONTINUED] omeprazole (PRILOSEC) 40 MG capsule Take 40 mg by mouth daily.      Family History: The patient's family history includes Brain cancer in his mother; Diabetes Mellitus II in his brother and paternal grandfather; Lung cancer in his mother; Lymphoma in his father.   ROS:   Please see the history of present illness.     All other systems reviewed and are negative.   Labs/Other Tests and Data Reviewed:     Recent Labs: 07/02/2018: NT-Pro BNP 143 07/12/2018: ALT 25; Hemoglobin 14.8; Magnesium 2.2; Platelets 225 09/02/2018: BNP 113.5; BUN 10;  Creatinine, Ser 0.88; Potassium 4.1; Sodium 141  Recent Lipid Panel    Component Value Date/Time   CHOL 124 03/27/2018 0940   TRIG 218 (H) 03/27/2018 0940   HDL 35 (L) 03/27/2018 0940   CHOLHDL 3.5 03/27/2018 0940   LDLCALC 45 03/27/2018 0940      Exam:    Vital Signs:  Wt (!) 373 lb (169.2 kg)    BMI 45.40 kg/m     Wt Readings from Last 3 Encounters:  10/03/18 (!) 373 lb (169.2 kg)  09/02/18 (!) 384 lb (174.2 kg)  07/12/18 (!) 366 lb (166 kg)     Well nourished, well developed in no acute distress. Alert awake 99 3 quite cheerful on the phone.  Diagnosis for this visit:   1. Essential hypertension, benign   2. Angina pectoris ([place])   3. Ischemic cardiomyopathy   4. Type 2 diabetes mellitus with complication, without long-term current use of insulin ([place])   5. Shortness of breath      ASSESSMENT & PLAN:    1.  Essential hypertension.  Blood pressure well controlled we will continue present management. 2.  Angina pectoris he described one episode of chest pain that happened when he was laying down.  Again recent cardiac catheterization  reviewed did not show any target lesion for intervention. 3.  Ischemic cardiomyopathy we tried putting gradually on appropriate medication since he seems to be doing well from that point of view.  Compensated. 4.  Type 2 diabetes apparently stable. 5.  Shortness of breath multifactorial I think his weight play significant role here.  He still struggling trying to lose it.  He is somebody who may benefit from gastric bypass surgery.  COVID-19 Education: The signs and symptoms of COVID-19 were discussed with the patient and how to seek care for testing (follow up with PCP or arrange E-visit).  The importance of social distancing was discussed today.  Patient Risk:   After full review of this patients clinical status, I feel that they are at least moderate risk at this time.  Time:   Today, I have spent 18 minutes with the patient with telehealth technology discussing pt health issues.  I spent 37minutes reviewing her chart before the visit.  Visit was finished at 1615 p.m.    Medication Adjustments/Labs and Tests Ordered: Current medicines are reviewed at length with the patient today.  Concerns regarding medicines are outlined above.  No orders of the defined types were placed in this encounter.  Medication changes:  Meds ordered this encounter  Medications   omeprazole (PRILOSEC) 40 MG capsule    Sig: Take 1 capsule (40 mg total) by mouth daily.    Dispense:  90 capsule    Refill:  1     Disposition: 3 Months  Signed, Park Liter, MD, [facility] 10/04/2018 10:44 AM    [place]

## 2018-10-29 ENCOUNTER — Telehealth: Payer: Self-pay | Admitting: *Deleted

## 2018-10-29 MED ORDER — NITROGLYCERIN 0.4 MG SL SUBL: 0.4000 mg | SUBLINGUAL_TABLET | SUBLINGUAL | 6 refills | Status: DC | PRN

## 2018-10-29 MED ORDER — SACUBITRIL-VALSARTAN 49-51 MG PO TABS: 1.0000 | ORAL_TABLET | Freq: Two times a day (BID) | ORAL | 1 refills | Status: DC

## 2018-10-29 NOTE — Telephone Encounter (Signed)
Rx refill sent to pharmacy.  *STAT* If patient is at the pharmacy, call can be transferred to refill team.   1. Which medications need to be refilled? (please list name of each medication and dose if known) Entresto 49-51 and nitroglycerine.  2. Which pharmacy/location (including street and city if local pharmacy) is medication to be sent to[facility] [place]  3. Do they need a 30 day or 90 day supply? [place]

## 2018-12-06 ENCOUNTER — Encounter: Payer: Self-pay | Admitting: Cardiology

## 2018-12-06 ENCOUNTER — Other Ambulatory Visit: Payer: Self-pay

## 2018-12-06 ENCOUNTER — Telehealth (INDEPENDENT_AMBULATORY_CARE_PROVIDER_SITE_OTHER): Payer: BC Managed Care – PPO | Admitting: Cardiology

## 2018-12-06 VITALS — BP 109/64

## 2018-12-06 DIAGNOSIS — I255 Ischemic cardiomyopathy: Secondary | ICD-10-CM

## 2018-12-06 DIAGNOSIS — I10 Essential (primary) hypertension: Secondary | ICD-10-CM

## 2018-12-06 DIAGNOSIS — I4510 Unspecified right bundle-branch block: Secondary | ICD-10-CM

## 2018-12-06 DIAGNOSIS — G4733 Obstructive sleep apnea (adult) (pediatric): Secondary | ICD-10-CM

## 2018-12-06 DIAGNOSIS — I5021 Acute systolic (congestive) heart failure: Secondary | ICD-10-CM

## 2018-12-06 NOTE — Progress Notes (Signed)
Virtual Visit via Video Note   This visit type was conducted due to national recommendations for restrictions regarding the COVID-19 Pandemic (e.g. social distancing) in an effort to limit this patient's exposure and mitigate transmission in our community.  Due to his co-morbid illnesses, this patient is at least at moderate risk for complications without adequate follow up.  This format is felt to be most appropriate for this patient at this time.  All issues noted in this document were discussed and addressed.  A limited physical exam was performed with this format.  Please refer to the patient's chart for his consent to telehealth for [facility].  Evaluation Performed:  Follow-up visit  This visit type was conducted due to national recommendations for restrictions regarding the COVID-19 Pandemic (e.g. social distancing).  This format is felt to be most appropriate for this patient at this time.  All issues noted in this document were discussed and addressed.  No physical exam was performed (except for noted visual exam findings with Video Visits).  Please refer to the patient's chart (MyChart message for video visits and phone note for telephone visits) for the patient's consent to telehealth for [facility].  Date:  12/06/2018  ID: Joe [name], DOB [birth year], MRN [id number]   Patient Location: [place] [address]   Provider location:   [place] Office  PCP:  Cyndi Bender, PA-C  Cardiologist:  Jenne Campus, MD     Chief Complaint: Not feeling well  History of Present Illness:    Joe [name] is a 48 y.o. male  who presents via audio/video conferencing for a telehealth visit today.  Past medical history significant for cardiomyopathy which is ischemic in origin with last estimation of ejection fraction from January being 26.  He did have a cardiac catheterization done at that time which showed no palpitations for intervention.  He is  not doing well he complained of being tired exhausted short of breath he also described to have some episodes where his blood pressure dropping.  Few times he have to take nitroglycerin for some atypical chest pain with relief.  The plans were to put him on the right medications he is already on Entresto as well as beta-blocker continue with this medication but will check his left ventricular ejection fraction and that is what we will schedule him to have Echocardiogram will be done if there is no improvement we can talk about ICD with potentially biventricular pacing.  He does have right bundle branch block however his QRS complex is quite wide therefore he may benefit from this procedure.  In the meantime I asked him to increase dose of ranolazine he will call us later today and tell us exactly if he take that medication because he is not sure about that today.   The patient does not have symptoms concerning for COVID-19 infection (fever, chills, cough, or new SHORTNESS OF BREATH).    Prior CV studies:   The following studies were reviewed today:  Cardiac catheterization from January showing distal LAD 40% patent LAD stent patent stent in the circumflex mid RCA 25% 50% ostial right PDA.  Distal RCA got 25% lesion.  Mildly elevated LVEDP     Past Medical History:  Diagnosis Date  . Abnormal EKG   . Body mass index 45.0-49.9, adult ([place])   . Complication of anesthesia 1995   "had hard time waking up; breathing w/vasectomy"  . Coronary artery disease   . Erectile  dysfunction   . Essential hypertension, benign   . Fatigue   . GERD (gastroesophageal reflux disease)   . High cholesterol    "just started tx yesterday" (09/06/2017)  . History of gout    "RX prn" (09/06/2017)  . Muscular chest pain   . Obesity   . OSA on CPAP   . Plantar fasciitis   . Right bundle branch block   . Testosterone deficiency   . Type II diabetes mellitus ([place])    "started tx 08/28/2017"    Past Surgical  History:  Procedure Laterality Date  . BACK SURGERY    . CORONARY ANGIOPLASTY WITH STENT PLACEMENT  09/06/2017   "3 stents"  . CORONARY STENT INTERVENTION N/A 09/06/2017   Procedure: CORONARY STENT INTERVENTION;  Surgeon: Jettie Booze, MD;  Location: [place] [facility];  Service: Cardiovascular;  Laterality: N/A;  . CORONARY STENT INTERVENTION N/A 02/15/2018   Procedure: CORONARY STENT INTERVENTION;  Surgeon: Jettie Booze, MD;  Location: [place] [facility];  Service: Cardiovascular;  Laterality: N/A;  . KNEE ARTHROSCOPY Right   . LEFT HEART CATH AND CORONARY ANGIOGRAPHY N/A 09/06/2017   Procedure: LEFT HEART CATH AND CORONARY ANGIOGRAPHY;  Surgeon: Jettie Booze, MD;  Location: [place] [facility];  Service: Cardiovascular;  Laterality: N/A;  . LEFT HEART CATH AND CORONARY ANGIOGRAPHY N/A 02/15/2018   Procedure: LEFT HEART CATH AND CORONARY ANGIOGRAPHY;  Surgeon: Jettie Booze, MD;  Location: [place] [facility];  Service: Cardiovascular;  Laterality: N/A;  . LEFT HEART CATH AND CORONARY ANGIOGRAPHY N/A 06/27/2018   Procedure: LEFT HEART CATH AND CORONARY ANGIOGRAPHY;  Surgeon: Jettie Booze, MD;  Location: [place] [facility];  Service: Cardiovascular;  Laterality: N/A;  . LUMBAR SPINE SURGERY  ~ 2001   Intradiscal Electrothermoplasty  . VASECTOMY  1995     Current Meds  Medication Sig  . allopurinol (ZYLOPRIM) 100 MG tablet Take 100 mg by mouth daily as needed (for gout).   Marland Kitchen aspirin EC 81 MG tablet Take 1 tablet (81 mg total) by mouth daily.  . carvedilol (COREG) 12.5 MG tablet Take 1 tablet (12.5 mg total) by mouth 2 (two) times daily.  . furosemide (LASIX) 40 MG tablet Take 2 tablets (80 mg total) by mouth daily.  . metFORMIN (GLUCOPHAGE-XR) 500 MG 24 hr tablet Take 1-2 tablets (500-1,000 mg total) by mouth See admin instructions. Take 500 mg by mouth in the morning and take 1000 mg by mouth in the evening  . nitroGLYCERIN (NITROSTAT) 0.4 MG SL tablet  Place 1 tablet (0.4 mg total) under the tongue every 5 (five) minutes as needed for chest pain.  Marland Kitchen omeprazole (PRILOSEC) 40 MG capsule Take 1 capsule (40 mg total) by mouth daily.  . ondansetron (ZOFRAN ODT) 4 MG disintegrating tablet 4mg  ODT q4 hours prn nausea/vomit  . Polyethylene Glycol 400 (BLINK TEARS OP) Place 2 drops into both eyes 2 (two) times daily as needed (for dry eyes).  . potassium chloride 20 MEQ TBCR Take 20 mEq by mouth daily.  . rosuvastatin (CRESTOR) 40 MG tablet TAKE 1 TABLET BY MOUTH EVERY DAY IN THE EVENING  . sacubitril-valsartan (ENTRESTO) 49-51 MG Take 1 tablet by mouth 2 (two) times daily.  Marland Kitchen spironolactone (ALDACTONE) 25 MG tablet TAKE 0.5 TABLETS BY MOUTH EVERY DAY  . ticagrelor (BRILINTA) 90 MG TABS tablet Take 1 tablet (90 mg total) by mouth 2 (two) times daily.      Family History: The patient's family history  includes Brain cancer in his mother; Diabetes Mellitus II in his brother and paternal grandfather; Lung cancer in his mother; Lymphoma in his father.   ROS:   Please see the history of present illness.     All other systems reviewed and are negative.   Labs/Other Tests and Data Reviewed:     Recent Labs: 07/02/2018: NT-Pro BNP 143 07/12/2018: ALT 25; Hemoglobin 14.8; Magnesium 2.2; Platelets 225 09/02/2018: BNP 113.5; BUN 10; Creatinine, Ser 0.88; Potassium 4.1; Sodium 141  Recent Lipid Panel    Component Value Date/Time   CHOL 124 03/27/2018 0940   TRIG 218 (H) 03/27/2018 0940   HDL 35 (L) 03/27/2018 0940   CHOLHDL 3.5 03/27/2018 0940   LDLCALC 45 03/27/2018 0940      Exam:    Vital Signs:  BP 109/64 Comment: Yesterday    Wt Readings from Last 3 Encounters:  10/03/18 (!) 373 lb (169.2 kg)  09/02/18 (!) 384 lb (174.2 kg)  07/12/18 (!) 366 lb (166 kg)     Well nourished, well developed in no acute distress. Alert oriented x3 and talking to him a video link.  He talks in [employer] home improvement store shopping.  Does not appear to  be in any distress.  Diagnosis for this visit:   1. Ischemic cardiomyopathy   2. Essential hypertension, benign   3. Right bundle branch block   4. Acute systolic heart failure ([place])   5. Obstructive sleep apnea   6. Class 3 severe obesity due to excess calories without serious comorbidity in adult, unspecified BMI ([place])      ASSESSMENT & PLAN:    1.  Ischemic cardiomyopathy complicated situation plan as outlined above we will get echocardiogram to establish his ejection fraction.  I asked him to increase dose of ranolazine to see if we can improve his symptomatology and ask him to continue taking nitroglycerin on as-needed basis.  Cardiac catheterization from January reviewed. 2.  Essential hypertension blood pressure well controlled actually facing opposite problem blood pressure being low 3.  Right bundle branch block noted.  Plan as outlined above 4.  Obstructive sleep apnea followed by internal medicine team 5.  Morbid obesity obviously a problem he understand he is to lose weight  COVID-19 Education: The signs and symptoms of COVID-19 were discussed with the patient and how to seek care for testing (follow up with PCP or arrange E-visit).  The importance of social distancing was discussed today.  Patient Risk:   After full review of this patients clinical status, I feel that they are at least moderate risk at this time.  Time:   Today, I have spent 16 minutes with the patient with telehealth technology discussing pt health issues.  I spent 5 minutes reviewing her chart before the visit.  Visit was finished at 10:30 AM.    Medication Adjustments/Labs and Tests Ordered: Current medicines are reviewed at length with the patient today.  Concerns regarding medicines are outlined above.  No orders of the defined types were placed in this encounter.  Medication changes: No orders of the defined types were placed in this encounter.    Disposition: Follow-up in 1 month in the   Signed, Park Liter, MD, [facility] 12/06/2018 11:29 AM    [place]

## 2018-12-06 NOTE — Patient Instructions (Signed)
Medication Instructions:  Your physician recommends that you continue on your current medications as directed. Please refer to the Current Medication list given to you today.  If you need a refill on your cardiac medications before your next appointment, please call your pharmacy.   Lab work: None.  If you have labs (blood work) drawn today and your tests are completely normal, you will receive your results only by: Marland Kitchen MyChart Message (if you have MyChart) OR . A paper copy in the mail If you have any lab test that is abnormal or we need to change your treatment, we will call you to review the results.  Testing/Procedures: Your physician has requested that you have an echocardiogram. Echocardiography is a painless test that uses sound waves to create images of your heart. It provides your doctor with information about the size and shape of your heart and how well your heart's chambers and valves are working. This procedure takes approximately one hour. There are no restrictions for this procedure.    Follow-Up: At [facility], you and your health needs are our priority.  As part of our continuing mission to provide you with exceptional heart care, we have created designated Provider Care Teams.  These Care Teams include your primary Cardiologist (physician) and Advanced Practice Providers (APPs -  Physician Assistants and Nurse Practitioners) who all work together to provide you with the care you need, when you need it. You will need a follow up appointment in 1 months.  Please call our office 2 months in advance to schedule this appointment.  You may see Jenne Campus, MD or another member of our [place] Provider Team in [place]: Shirlee More, MD . Jyl Heinz, MD  Any Other Special Instructions Will Be Listed Below (If Applicable).   Echocardiogram An echocardiogram is a procedure that uses painless sound waves (ultrasound) to produce an image of the heart. Images from  an echocardiogram can provide important information about:  Signs of coronary artery disease (CAD).  Aneurysm detection. An aneurysm is a weak or damaged part of an artery wall that bulges out from the normal force of blood pumping through the body.  Heart size and shape. Changes in the size or shape of the heart can be associated with certain conditions, including heart failure, aneurysm, and CAD.  Heart muscle function.  Heart valve function.  Signs of a past heart attack.  Fluid buildup around the heart.  Thickening of the heart muscle.  A tumor or infectious growth around the heart valves. Tell a health care provider about:  Any allergies you have.  All medicines you are taking, including vitamins, herbs, eye drops, creams, and over-the-counter medicines.  Any blood disorders you have.  Any surgeries you have had.  Any medical conditions you have.  Whether you are pregnant or may be pregnant. What are the risks? Generally, this is a safe procedure. However, problems may occur, including:  Allergic reaction to dye (contrast) that may be used during the procedure. What happens before the procedure? No specific preparation is needed. You may eat and drink normally. What happens during the procedure?   An IV tube may be inserted into one of your veins.  You may receive contrast through this tube. A contrast is an injection that improves the quality of the pictures from your heart.  A gel will be applied to your chest.  A wand-like tool (transducer) will be moved over your chest. The gel will help to transmit the sound  waves from the transducer.  The sound waves will harmlessly bounce off of your heart to allow the heart images to be captured in real-time motion. The images will be recorded on a computer. The procedure may vary among health care providers and hospitals. What happens after the procedure?  You may return to your normal, everyday life, including diet,  activities, and medicines, unless your health care provider tells you not to do that. Summary  An echocardiogram is a procedure that uses painless sound waves (ultrasound) to produce an image of the heart.  Images from an echocardiogram can provide important information about the size and shape of your heart, heart muscle function, heart valve function, and fluid buildup around your heart.  You do not need to do anything to prepare before this procedure. You may eat and drink normally.  After the echocardiogram is completed, you may return to your normal, everyday life, unless your health care provider tells you not to do that. This information is not intended to replace advice given to you by your health care provider. Make sure you discuss any questions you have with your health care provider. Document Released: 06/02/2000 Document Revised: 07/08/2016 Document Reviewed: 07/08/2016 Elsevier Interactive Patient Education  2019 [employer].

## 2018-12-06 NOTE — Addendum Note (Signed)
Addended by: Linna Hoff R on: 12/06/2018 11:59 AM   Modules accepted: Orders

## 2018-12-09 ENCOUNTER — Other Ambulatory Visit: Payer: Self-pay

## 2018-12-09 ENCOUNTER — Other Ambulatory Visit: Payer: Self-pay | Admitting: Cardiology

## 2018-12-09 MED ORDER — ROSUVASTATIN CALCIUM 40 MG PO TABS: 40.0000 mg | ORAL_TABLET | Freq: Every evening | ORAL | 6 refills | Status: DC

## 2018-12-09 MED ORDER — RANOLAZINE ER 500 MG PO TB12: 500.0000 mg | ORAL_TABLET | Freq: Two times a day (BID) | ORAL | 6 refills | Status: DC

## 2018-12-12 ENCOUNTER — Other Ambulatory Visit: Payer: Self-pay | Admitting: Cardiology

## 2018-12-12 NOTE — Telephone Encounter (Signed)
Brilinta refill sent to [facility] in [place]

## 2018-12-20 ENCOUNTER — Emergency Department (HOSPITAL_BASED_OUTPATIENT_CLINIC_OR_DEPARTMENT_OTHER)
Admission: EM | Admit: 2018-12-20 | Discharge: 2018-12-21 | Disposition: A | Discharge status: Home / Self Care | Payer: BC Managed Care – PPO | Attending: Emergency Medicine | Admitting: Emergency Medicine

## 2018-12-20 ENCOUNTER — Other Ambulatory Visit: Payer: Self-pay

## 2018-12-20 ENCOUNTER — Emergency Department (HOSPITAL_BASED_OUTPATIENT_CLINIC_OR_DEPARTMENT_OTHER): Payer: BC Managed Care – PPO

## 2018-12-20 ENCOUNTER — Encounter (HOSPITAL_BASED_OUTPATIENT_CLINIC_OR_DEPARTMENT_OTHER): Payer: Self-pay

## 2018-12-20 DIAGNOSIS — Z20828 Contact with and (suspected) exposure to other viral communicable diseases: Secondary | ICD-10-CM | POA: Insufficient documentation

## 2018-12-20 DIAGNOSIS — R079 Chest pain, unspecified: Secondary | ICD-10-CM | POA: Diagnosis present

## 2018-12-20 DIAGNOSIS — E119 Type 2 diabetes mellitus without complications: Secondary | ICD-10-CM | POA: Diagnosis not present

## 2018-12-20 DIAGNOSIS — Z79899 Other long term (current) drug therapy: Secondary | ICD-10-CM | POA: Diagnosis not present

## 2018-12-20 DIAGNOSIS — I252 Old myocardial infarction: Secondary | ICD-10-CM | POA: Insufficient documentation

## 2018-12-20 DIAGNOSIS — I10 Essential (primary) hypertension: Secondary | ICD-10-CM | POA: Insufficient documentation

## 2018-12-20 DIAGNOSIS — N179 Acute kidney failure, unspecified: Secondary | ICD-10-CM

## 2018-12-20 DIAGNOSIS — I2510 Atherosclerotic heart disease of native coronary artery without angina pectoris: Secondary | ICD-10-CM | POA: Diagnosis not present

## 2018-12-20 HISTORY — DX: Heart failure, unspecified: I50.9

## 2018-12-20 LAB — HEPATIC FUNCTION PANEL
ALT: 23 U/L (ref 0–44)
AST: 19 U/L (ref 15–41)
Albumin: 3.6 g/dL (ref 3.5–5.0)
Alkaline Phosphatase: 60 U/L (ref 38–126)
Bilirubin, Direct: 0.2 mg/dL (ref 0.0–0.2)
Indirect Bilirubin: 0.8 mg/dL (ref 0.3–0.9)
Total Bilirubin: 1 mg/dL (ref 0.3–1.2)
Total Protein: 7 g/dL (ref 6.5–8.1)

## 2018-12-20 LAB — CBC
HCT: 44.2 % (ref 39.0–52.0)
Hemoglobin: 14.6 g/dL (ref 13.0–17.0)
MCH: 30.7 pg (ref 26.0–34.0)
MCHC: 33 g/dL (ref 30.0–36.0)
MCV: 92.9 fL (ref 80.0–100.0)
Platelets: 260 10*3/uL (ref 150–400)
RBC: 4.76 MIL/uL (ref 4.22–5.81)
RDW: 12.4 % (ref 11.5–15.5)
WBC: 7.6 10*3/uL (ref 4.0–10.5)
nRBC: 0 % (ref 0.0–0.2)

## 2018-12-20 LAB — BASIC METABOLIC PANEL
Anion gap: 10 (ref 5–15)
BUN: 15 mg/dL (ref 6–20)
CO2: 28 mmol/L (ref 22–32)
Calcium: 9.2 mg/dL (ref 8.9–10.3)
Chloride: 100 mmol/L (ref 98–111)
Creatinine, Ser: 1.44 mg/dL — ABNORMAL HIGH (ref 0.61–1.24)
GFR calc Af Amer: >60 mL/min (ref >60)
GFR calc non Af Amer: 57 mL/min — ABNORMAL LOW (ref >60)
Glucose, Bld: 157 mg/dL — ABNORMAL HIGH (ref 70–99)
Potassium: 4.3 mmol/L (ref 3.5–5.1)
Sodium: 138 mmol/L (ref 135–145)

## 2018-12-20 LAB — TROPONIN I (HIGH SENSITIVITY)
Troponin I (High Sensitivity): 15 ng/L (ref <18)
Troponin I (High Sensitivity): 13 ng/L (ref <18)

## 2018-12-20 LAB — LIPASE, BLOOD: Lipase: 24 U/L (ref 11–51)

## 2018-12-20 LAB — SARS CORONAVIRUS 2 AG (30 MIN TAT): SARS Coronavirus 2 Ag: NEGATIVE

## 2018-12-20 MED ORDER — IOHEXOL 350 MG/ML SOLN
100.0000 mL | Freq: Once | INTRAVENOUS | Status: AC | PRN
  Administered 2018-12-20: 100 mL via INTRAVENOUS

## 2018-12-20 MED ORDER — ASPIRIN 81 MG PO CHEW
81.0000 mg | CHEWABLE_TABLET | Freq: Once | ORAL | Status: AC
  Administered 2018-12-20: 81 mg via ORAL
  Filled 2018-12-20: qty 1

## 2018-12-20 MED ORDER — SODIUM CHLORIDE 0.9 % IV BOLUS
500.0000 mL | Freq: Once | INTRAVENOUS | Status: AC
  Administered 2018-12-20: 500 mL via INTRAVENOUS

## 2018-12-20 MED ORDER — SODIUM CHLORIDE 0.9% FLUSH
3.0000 mL | Freq: Once | INTRAVENOUS | Status: DC
  Filled 2018-12-20: qty 3

## 2018-12-20 MED ORDER — SODIUM CHLORIDE 0.9 % IV BOLUS
1000.0000 mL | Freq: Once | INTRAVENOUS | Status: AC
  Administered 2018-12-20: 1000 mL via INTRAVENOUS

## 2018-12-20 NOTE — ED Notes (Signed)
pts family member was updated per his permission.

## 2018-12-20 NOTE — ED Notes (Signed)
EDP Trixie Deis notified of patients vital signs orders received. Pt also requested update on condition. RN informed EDP.

## 2018-12-20 NOTE — ED Provider Notes (Signed)
[facility] EMERGENCY DEPARTMENT Provider Note   CSN: [id number] Arrival date & time: 12/20/18  1638    History   Chief Complaint Chief Complaint  Patient presents with   Chest Pain    HPI Joe [name] is a 48 y.o. male with history of CAD, 3 stent placement, MI, hypertension, morbid obesity, diabetes, sleep apnea presents today for chest and abdominal pain.  Patient reports that yesterday he began having a pain to his upper abdomen ache "knot-like" feeling aching and pulling that has been constant since onset with some nausea patient reports that starting this morning the pain began to radiate up into his chest, substernal constant without aggravating or alleviating factors moderate to severe in intensity.  Patient reports compliance with all of his home medications.     HPI  Past Medical History:  Diagnosis Date   Abnormal EKG    Body mass index 45.0-49.9, adult (HCC)    CHF (congestive heart failure) (HCC)    Complication of anesthesia 1995   "had hard time waking up; breathing w/vasectomy"   Coronary artery disease    Erectile dysfunction    Essential hypertension, benign    Fatigue    GERD (gastroesophageal reflux disease)    High cholesterol    "just started tx yesterday" (09/06/2017)   History of gout    "RX prn" (09/06/2017)   Muscular chest pain    Obesity    OSA on CPAP    Plantar fasciitis    Right bundle branch block    Testosterone deficiency    Type II diabetes mellitus ([employer])    "started tx 08/28/2017"    Patient Active Problem List   Diagnosis Date Noted   Coronary artery disease    Pre-operative clearance 02/11/2018   CAD S/P percutaneous coronary angioplasty 09/07/2017   Ischemic cardiomyopathy 18/84/1660   Acute systolic heart failure ([place]) 09/06/2017   Angina pectoris ([place]) 09/05/2017   Shortness of breath 09/05/2017   Type 2 diabetes mellitus with complication, without long-term current use of insulin  ([place]) 09/05/2017   Obstructive sleep apnea    GERD (gastroesophageal reflux disease)    Erectile dysfunction    Essential hypertension, benign    Obesity    Testosterone deficiency    Gout    Abnormal EKG    Right bundle branch block     Past Surgical History:  Procedure Laterality Date   BACK SURGERY     CORONARY ANGIOPLASTY WITH STENT PLACEMENT  09/06/2017   "3 stents"   CORONARY STENT INTERVENTION N/A 09/06/2017   Procedure: CORONARY STENT INTERVENTION;  Surgeon: Jettie Booze, MD;  Location: [facility];  Service: Cardiovascular;  Laterality: N/A;   CORONARY STENT INTERVENTION N/A 02/15/2018   Procedure: CORONARY STENT INTERVENTION;  Surgeon: Jettie Booze, MD;  Location: [facility];  Service: Cardiovascular;  Laterality: N/A;   KNEE ARTHROSCOPY Right    LEFT HEART CATH AND CORONARY ANGIOGRAPHY N/A 09/06/2017   Procedure: LEFT HEART CATH AND CORONARY ANGIOGRAPHY;  Surgeon: Jettie Booze, MD;  Location: [facility];  Service: Cardiovascular;  Laterality: N/A;   LEFT HEART CATH AND CORONARY ANGIOGRAPHY N/A 02/15/2018   Procedure: LEFT HEART CATH AND CORONARY ANGIOGRAPHY;  Surgeon: Jettie Booze, MD;  Location: [facility];  Service: Cardiovascular;  Laterality: N/A;   LEFT HEART CATH AND CORONARY ANGIOGRAPHY N/A 06/27/2018   Procedure: LEFT HEART CATH AND CORONARY ANGIOGRAPHY;  Surgeon: Jettie Booze, MD;  Location:  [facility];  Service: Cardiovascular;  Laterality: N/A;   LUMBAR SPINE SURGERY  ~ 2001   [place] Medications    Prior to Admission medications   Medication Sig Start Date End Date Taking? Authorizing Provider  allopurinol (ZYLOPRIM) 100 MG tablet Take 100 mg by mouth daily as needed (for gout).     [provider]  aspirin EC 81 MG tablet Take 1 tablet (81 mg total) by mouth daily. 09/05/17   Park Liter, MD    BRILINTA 90 MG TABS tablet TAKE 1 TABLET BY MOUTH TWICE A DAY 12/12/18   Park Liter, MD  carvedilol (COREG) 12.5 MG tablet Take 1 tablet (12.5 mg total) by mouth 2 (two) times daily. 03/27/18 07/03/19  Park Liter, MD  furosemide (LASIX) 40 MG tablet Take 2 tablets (80 mg total) by mouth daily. 09/04/18   Park Liter, MD  metFORMIN (GLUCOPHAGE-XR) 500 MG 24 hr tablet Take 1-2 tablets (500-1,000 mg total) by mouth See admin instructions. Take 500 mg by mouth in the morning and take 1000 mg by mouth in the evening 06/29/18   Jettie Booze, MD  nitroGLYCERIN (NITROSTAT) 0.4 MG SL tablet Place 1 tablet (0.4 mg total) under the tongue every 5 (five) minutes as needed for chest pain. 10/29/18 01/27/19  Park Liter, MD  omeprazole (PRILOSEC) 40 MG capsule Take 1 capsule (40 mg total) by mouth daily. 10/03/18   Park Liter, MD  ondansetron (ZOFRAN ODT) 4 MG disintegrating tablet 4mg  ODT q4 hours prn nausea/vomit 07/12/18   Deno Etienne, DO  Polyethylene Glycol 400 (BLINK TEARS OP) Place 2 drops into both eyes 2 (two) times daily as needed (for dry eyes).    [provider]  potassium chloride 20 MEQ TBCR Take 20 mEq by mouth daily. 09/04/18 12/06/19  Park Liter, MD  ranolazine (RANEXA) 500 MG 12 hr tablet Take 1 tablet (500 mg total) by mouth 2 (two) times daily. 12/09/18   Park Liter, MD  rosuvastatin (CRESTOR) 40 MG tablet Take 1 tablet (40 mg total) by mouth every evening. 12/09/18   Park Liter, MD  sacubitril-valsartan (ENTRESTO) 49-51 MG Take 1 tablet by mouth 2 (two) times daily. 10/29/18   Park Liter, MD  spironolactone (ALDACTONE) 25 MG tablet TAKE 0.5 TABLETS BY MOUTH EVERY DAY 09/27/18   Leanor Kail, PA    Family History Family History  Problem Relation Age of Onset   Lymphoma Father    Diabetes Mellitus II Brother    Diabetes Mellitus II Paternal Grandfather    Lung cancer Mother    Brain cancer Mother      Social History Social History   Tobacco Use   Smoking status: Never Smoker   Smokeless tobacco: Never Used  Substance Use Topics   Alcohol use: No    Frequency: Never   Drug use: Never     Allergies   Patient has no known allergies.   Review of Systems Review of Systems  Constitutional: Negative.  Negative for chills and fever.  Respiratory: Negative.  Negative for cough and shortness of breath.   Cardiovascular: Positive for chest pain. Negative for palpitations.  Gastrointestinal: Positive for abdominal pain and nausea. Negative for diarrhea and vomiting.  Musculoskeletal: Negative.  Negative for back pain and neck pain.  Neurological: Negative.  Negative for headaches.  All other systems reviewed and are negative.  Physical Exam Updated Vital Signs BP 92/65    Pulse 94    Temp 97.8 F (36.6 C) (Oral)    Resp 18    Ht 6\' 3"  (1.905 m)    Wt (!) 167.4 kg    SpO2 95%    BMI 46.12 kg/m   Physical Exam Constitutional:      General: He is not in acute distress.    Appearance: Normal appearance. He is well-developed. He is obese. He is not ill-appearing or diaphoretic.     Comments: Uncomfortable appearing  HENT:     Head: Normocephalic and atraumatic.     Right Ear: External ear normal.     Left Ear: External ear normal.     Nose: Nose normal.  Eyes:     General: Vision grossly intact. Gaze aligned appropriately.     Pupils: Pupils are equal, round, and reactive to light.  Neck:     Musculoskeletal: Normal range of motion.     Trachea: Trachea and phonation normal. No tracheal deviation.  Cardiovascular:     Rate and Rhythm: Normal rate and regular rhythm.     Pulses:          Radial pulses are 2+ on the right side and 2+ on the left side.       Dorsalis pedis pulses are 2+ on the right side and 2+ on the left side.     Heart sounds: Normal heart sounds.  Pulmonary:     Effort: Pulmonary effort is normal. No respiratory distress.     Breath sounds:  Normal breath sounds.  Chest:     Chest wall: No deformity, tenderness or crepitus.  Abdominal:     General: There is no distension.     Palpations: Abdomen is soft.     Tenderness: There is no abdominal tenderness. There is no guarding or rebound.  Musculoskeletal: Normal range of motion.     Right lower leg: He exhibits no tenderness.     Left lower leg: He exhibits no tenderness.  Skin:    General: Skin is warm and dry.  Neurological:     Mental Status: He is alert.     GCS: GCS eye subscore is 4. GCS verbal subscore is 5. GCS motor subscore is 6.     Comments: Speech is clear and goal oriented, follows commands Major Cranial nerves without deficit, no facial droop Moves extremities without ataxia, coordination intact  Psychiatric:        Behavior: Behavior normal.    ED Treatments / Results  Labs (all labs ordered are listed, but only abnormal results are displayed) Labs Reviewed  BASIC METABOLIC PANEL - Abnormal; Notable for the following components:      Result Value   Glucose, Bld 157 (*)    Creatinine, Ser 1.44 (*)    GFR calc non Af Amer 57 (*)    All other components within normal limits  SARS CORONAVIRUS 2 (HOSP ORDER, PERFORMED IN [facility] VIA ABBOTT ID)  CBC  TROPONIN I (HIGH SENSITIVITY)  TROPONIN I (HIGH SENSITIVITY)    EKG EKG Interpretation  Date/Time:  Friday December 20 2018 16:48:02 EDT Ventricular Rate:  107 PR Interval:    QRS Duration: 174 QT Interval:  424 QTC Calculation: 566 R Axis:   -67 Text Interpretation:  Sinus tachycardia with frequent Premature ventricular complexes Left axis deviation Right bundle branch block Left ventricular hypertrophy with repolarization abnormality SImilar to previous Confirmed by Elnora Morrison [phone]) on  12/20/2018 5:06:37 PM   Radiology Dg Chest 2 View  Result Date: 12/20/2018 CLINICAL DATA:  Chest pain. EXAM: CHEST - 2 VIEW COMPARISON:  June 26, 2018 FINDINGS: The heart size and mediastinal contours  are within normal limits. Both lungs are clear. The visualized skeletal structures are unremarkable. IMPRESSION: No active cardiopulmonary disease. Electronically Signed   By: Dorise Bullion III M.D   On: 12/20/2018 17:47   Ct Angio Chest/abd/pel For Dissection W And/or Wo Contrast  Result Date: 12/20/2018 CLINICAL DATA:  Mid chest and upper abdominal pain for 1 day. Nausea. EXAM: CT ANGIOGRAPHY CHEST, ABDOMEN AND PELVIS TECHNIQUE: Multidetector CT imaging through the chest, abdomen and pelvis was performed using the standard protocol during bolus administration of intravenous contrast. Multiplanar reconstructed images and MIPs were obtained and reviewed to evaluate the vascular anatomy. CONTRAST:  125mL OMNIPAQUE IOHEXOL 350 MG/ML SOLN COMPARISON:  Chest x-ray December 20, 2018 FINDINGS: CTA CHEST FINDINGS Cardiovascular: The heart size is borderline. Stents are identified in the LAD and circumflex coronary arteries. The thoracic aorta is nonaneurysmal with no significant atherosclerotic change. No dissection. The central pulmonary arteries are unremarkable. Mediastinum/Nodes: No enlarged mediastinal, hilar, or axillary lymph nodes. Thyroid gland, trachea, and esophagus demonstrate no significant findings. Lungs/Pleura: Central airways are normal. No pneumothorax identified. There is a nodule in the right mid lung on series 7, image 32 and coronal image 130 measuring 5.3 mm, adjacent to the fissure. No other suspicious nodules or masses. No focal infiltrates identified. Musculoskeletal: No chest wall abnormality. No acute or significant osseous findings. Review of the MIP images confirms the above findings. CTA ABDOMEN AND PELVIS FINDINGS VASCULAR Aorta: Normal caliber aorta without aneurysm, dissection, vasculitis or significant stenosis. Celiac: Patent without evidence of aneurysm, dissection, vasculitis or significant stenosis. SMA: Patent without evidence of aneurysm, dissection, vasculitis or significant  stenosis. Renals: Both renal arteries are patent without evidence of aneurysm, dissection, vasculitis, fibromuscular dysplasia or significant stenosis. IMA: Patent without evidence of aneurysm, dissection, vasculitis or significant stenosis. Iliac vessels: Normal. Inflow: Patent without evidence of aneurysm, dissection, vasculitis or significant stenosis. Veins: No obvious venous abnormality within the limitations of this arterial phase study. Review of the MIP images confirms the above findings. NON-VASCULAR Hepatobiliary: No focal liver abnormality is seen. No gallstones, gallbladder wall thickening, or biliary dilatation. Pancreas: Unremarkable. No pancreatic ductal dilatation or surrounding inflammatory changes. Spleen: Normal in size without focal abnormality. Adrenals/Urinary Tract: There is a probable cyst in the upper pole the left kidney, too small to characterize. The adrenal glands, kidneys, ureters, and bladder are otherwise normal. Stomach/Bowel: Stomach is within normal limits. Appendix appears normal. No evidence of bowel wall thickening, distention, or inflammatory changes. Lymphatic: No significant vascular findings are present. No enlarged abdominal or pelvic lymph nodes. Reproductive: Prostate is unremarkable. Other: There is a fat containing left inguinal hernia. There is a fat containing umbilical hernia. No free air or free fluid. Musculoskeletal: No acute or significant osseous findings. Review of the MIP images confirms the above findings. IMPRESSION: 1. No aneurysm or dissection in the thoracic or abdominal aorta. 2. Stents in the left circumflex and anterior descending coronary arteries. 3. 5.3 mm nodule in the right lung. No follow-up needed if patient is low-risk. Non-contrast chest CT can be considered in 12 months if patient is high-risk. This recommendation follows the consensus statement: Guidelines for Management of Incidental Pulmonary Nodules Detected on CT Images: From the  Fleischner Society 2017; Radiology 2017; 284:228-243. Electronically Signed   By: Shanon Brow  Jimmye Norman III M.D   On: 12/20/2018 19:55    Procedures Procedures (including critical care time)  Medications Ordered in ED Medications  sodium chloride flush (NS) 0.9 % injection 3 mL (3 mLs Intravenous Not Given 12/20/18 1918)  sodium chloride 0.9 % bolus 1,000 mL (0 mLs Intravenous Stopped 12/20/18 2019)  iohexol (OMNIPAQUE) 350 MG/ML injection 100 mL (100 mLs Intravenous Contrast Given 12/20/18 1925)     Initial Impression / Assessment and Plan / ED Course  I have reviewed the triage vital signs and the nursing notes.  Pertinent labs & imaging results that were available during my care of the patient were reviewed by me and considered in my medical decision making (see chart for details).  Clinical Course as of Dec 19 2244  Fri Dec 20, 2018  1854 Called into room by nursing staff for blood pressures consistently 81W systolic, 1 L fluid bolus running.  CBC within normal limits, chest x-ray negative, EKG without changes.  Exam patient appears somewhat uncomfortable, distal pulses intact in all 4 extremities and equal, lungs clear, heart regular rate and rhythm without murmur.  Discussed case with Dr. Reather Converse will pursue CT dissection study as he is with new abdominal pain radiating up into his chest with hypotension.   [BM]  2014 High-sensitivity troponin negative BMP with elevated creatinine    [BM]  2014 CT dissection study: IMPRESSION: 1. No aneurysm or dissection in the thoracic or abdominal aorta. 2. Stents in the left circumflex and anterior descending coronary arteries. 3. 5.3 mm nodule in the right lung. No follow-up needed if patient is low-risk. Non-contrast chest CT can be considered in 12 months if patient is high-risk. This recommendation follows the consensus statement: Guidelines for Management of Incidental Pulmonary Nodules Detected on CT Images: From the Fleischner Society 2017;  Radiology 2017; 284:228-243.   [BM]  2014 Patient with high risk cardiac history will need admission, consult placed to hospitalist service, screening COVID-19 test pending. Patient seen and evaluated by Dr. Reather Converse.   [BM]  2037 Discussed case with hospitalist, Dr. Myna Hidalgo.  We will add lipase and LFTs to work-up, labs if non-revealing and delta troponin without elevation will call back for admission.   [BM]  2201 LFTs and lipase within normal limits.  Awaiting delta troponin.   [BM]  2246 Care handoff given to Dr. Steffanie Dunn at shift change, plan of care discussed and agreed upon, admission to hospitalist if no acute increasing troponin.   [BM]    Clinical Course User Index [BM] Deliah Boston, PA-C   Patient reassessed multiple times during this visit, stable appearing after fluid bolus no acute distress states understanding of plan for admission and is agreeable.  Note: Portions of this report may have been transcribed using voice recognition software. Every effort was made to ensure accuracy; however, inadvertent computerized transcription errors may still be present. Final Clinical Impressions(s) / ED Diagnoses   Final diagnoses:  Chest pain, unspecified type    ED Discharge Orders    None       Gari Crown 12/20/18 2248    Elnora Morrison, MD 12/20/18 [phone]

## 2018-12-20 NOTE — ED Notes (Signed)
Patient transported to CT 

## 2018-12-20 NOTE — ED Notes (Signed)
Chest pain x 2 days w some sob and nausea

## 2018-12-20 NOTE — Discharge Instructions (Signed)
Call your heart doctor Monday. Return for new or worsening symptoms.

## 2018-12-20 NOTE — ED Triage Notes (Signed)
C/o central CP and upper abd pain day 2-NAD-steady gait

## 2018-12-21 NOTE — ED Notes (Signed)
Pt understood dc material. NAD noted. All questions answered to satisfaction. Pt ambulated to checkout counter without difficulty.

## 2018-12-21 NOTE — ED Notes (Signed)
EDP aware of vital signs at dc

## 2018-12-23 ENCOUNTER — Telehealth: Payer: Self-pay | Admitting: *Deleted

## 2018-12-23 NOTE — Telephone Encounter (Signed)
Telephone call to patient. Patient had sent an My chart message stating dark urine and dry lips. Noticed he went to the ED the same day and was checking on him. He was not admitted and rule out for MI. States feeling better and has an echo coming up. NO further questions from patient

## 2018-12-27 ENCOUNTER — Other Ambulatory Visit: Payer: Self-pay

## 2018-12-27 ENCOUNTER — Ambulatory Visit (INDEPENDENT_AMBULATORY_CARE_PROVIDER_SITE_OTHER): Payer: BC Managed Care – PPO

## 2018-12-27 DIAGNOSIS — I255 Ischemic cardiomyopathy: Secondary | ICD-10-CM

## 2018-12-27 MED ORDER — PERFLUTREN LIPID MICROSPHERE: 1.0000 mL | INTRAVENOUS | 0 refills | Status: DC | PRN

## 2018-12-27 NOTE — Progress Notes (Signed)
IV was inserted into the right AC area with 1 attempt. 6 mL of Definity was used. IV was removed, Area was intact, Definity(Perflutrin) order was attempted to be entered but was unsuccessful.   Jimmy Zachari Alberta RDCS, RVT

## 2018-12-27 NOTE — Progress Notes (Signed)
Complete echocardiogram with contrast has been performed.   Jimmy Gailen Venne RDCS, RVT 

## 2018-12-31 ENCOUNTER — Telehealth: Payer: Self-pay | Admitting: *Deleted

## 2018-12-31 NOTE — Telephone Encounter (Signed)
Telephone call to patient. Informed of echo results. Reminded of up coming appointment 01/17/19.

## 2018-12-31 NOTE — Telephone Encounter (Signed)
-----   Message from Park Liter, MD sent at 12/29/2018  5:50 PM EDT ----- Echo showed still diminished LVEF, no improvement,was 30-35% now 25%,

## 2019-01-01 NOTE — Telephone Encounter (Signed)
Telephone call to wife. Asking if Pedilyte is Ok to drink to prevent dehydration. Informed to just keep liquids to no more than 2 L /day and discuss with Dr. Agustin Cree on 01/17/19 appointment

## 2019-01-01 NOTE — Telephone Encounter (Signed)
Please call patients wife regarding these results,

## 2019-01-04 ENCOUNTER — Other Ambulatory Visit: Payer: Self-pay | Admitting: Cardiology

## 2019-01-07 ENCOUNTER — Other Ambulatory Visit: Payer: Self-pay | Admitting: Cardiology

## 2019-01-09 NOTE — Telephone Encounter (Signed)
brilinta and KCL refills sent

## 2019-01-14 ENCOUNTER — Other Ambulatory Visit: Payer: Self-pay

## 2019-01-14 ENCOUNTER — Ambulatory Visit (INDEPENDENT_AMBULATORY_CARE_PROVIDER_SITE_OTHER): Payer: BC Managed Care – PPO | Admitting: Cardiology

## 2019-01-14 ENCOUNTER — Encounter: Payer: Self-pay | Admitting: Cardiology

## 2019-01-14 VITALS — BP 110/64 | HR 94 | Wt 380.0 lb

## 2019-01-14 DIAGNOSIS — Z9861 Coronary angioplasty status: Secondary | ICD-10-CM

## 2019-01-14 DIAGNOSIS — R0602 Shortness of breath: Secondary | ICD-10-CM

## 2019-01-14 DIAGNOSIS — I255 Ischemic cardiomyopathy: Secondary | ICD-10-CM

## 2019-01-14 DIAGNOSIS — I10 Essential (primary) hypertension: Secondary | ICD-10-CM

## 2019-01-14 DIAGNOSIS — I2510 Atherosclerotic heart disease of native coronary artery without angina pectoris: Secondary | ICD-10-CM | POA: Diagnosis not present

## 2019-01-14 MED ORDER — TICAGRELOR 90 MG PO TABS: 90.0000 mg | ORAL_TABLET | Freq: Two times a day (BID) | ORAL | 0 refills | Status: DC

## 2019-01-14 MED ORDER — SACUBITRIL-VALSARTAN 49-51 MG PO TABS: 1.0000 | ORAL_TABLET | Freq: Two times a day (BID) | ORAL | 1 refills | Status: DC

## 2019-01-14 MED ORDER — OMEPRAZOLE 40 MG PO CPDR: 40.0000 mg | DELAYED_RELEASE_CAPSULE | Freq: Every day | ORAL | 1 refills | Status: AC

## 2019-01-14 NOTE — Addendum Note (Signed)
Addended by: Ashok Norris on: 01/14/2019 03:06 PM   Modules accepted: Orders

## 2019-01-14 NOTE — Patient Instructions (Signed)
Medication Instructions:  Your physician recommends that you continue on your current medications as directed. Please refer to the Current Medication list given to you today.  If you need a refill on your cardiac medications before your next appointment, please call your pharmacy.   Lab work: None.  If you have labs (blood work) drawn today and your tests are completely normal, you will receive your results only by: Marland Kitchen MyChart Message (if you have MyChart) OR . A paper copy in the mail If you have any lab test that is abnormal or we need to change your treatment, we will call you to review the results.  Testing/Procedures: None.   Follow-Up: At [facility], you and your health needs are our priority.  As part of our continuing mission to provide you with exceptional heart care, we have created designated Provider Care Teams.  These Care Teams include your primary Cardiologist (physician) and Advanced Practice Providers (APPs -  Physician Assistants and Nurse Practitioners) who all work together to provide you with the care you need, when you need it. You will need a follow up appointment in 1 months.  Please call our office 2 months in advance to schedule this appointment.  You may see Jenne Campus, MD or another member of our [place] Provider Team in [place]: Shirlee More, MD . Jyl Heinz, MD  Any Other Special Instructions Will Be Listed Below (If Applicable).  Dr. Agustin Cree has referred you to see Dr. Sung Amabile with the heart failure clinic their office should call you within 1 week for an appointment if not please call our office.   Dr. Agustin Cree has referred you to see Dr. Curt Bears with electrophysiology please call our office you you do not hear from them within 1 week.

## 2019-01-14 NOTE — Progress Notes (Signed)
Cardiology Office Note:    Date:  01/14/2019   ID:  Joe [name], DOB 1970-12-18, MRN [id number]  PCP:  Cyndi Bender, PA-C  Cardiologist:  Jenne Campus, MD    Referring MD: Cyndi Bender, PA-C   Chief Complaint  Patient presents with  . Follow-up  Not doing well getting short of breath  History of Present Illness:    Joe [name] is a 48 y.o. male's.  He did have a stent to mid and distal LAD in March 2019 after that he recovered quite nicely did very well however gradually started deteriorating.  Eventually end up having another cardiac catheterization in January of this year which showed 40% distal LAD, stents were patent, mid RCA got 25% lesion right PDA had 50% stenosis the circumflex artery were open.  No intervention has been required.  Since that time he gradually deteriorated.  His last echocardiogram showed ejection fraction 20 to 25% in spite of optimal medical therapy which include Entresto Aldactone as well as Coreg.  He is symptomatic he is New York Heart Association class is 3.  An effort will bring significant shortness of breath.  There is no swelling of lower extremities.  I brought him today to office to talk about more advanced measures that can be taken for situation like that.  We discussed the issue of ICD with potential CRT therapy, he does have baseline right bundle branch block with a QRS complex duration of 150 ms.  Therefore he will qualify for CRT.  I will also refer him to advanced congestive heart failure clinic.  Past Medical History:  Diagnosis Date  . Abnormal EKG   . Body mass index 45.0-49.9, adult ([place])   . CHF (congestive heart failure) ([place])   . Complication of anesthesia 1995   "had hard time waking up; breathing w/vasectomy"  . Coronary artery disease   . Erectile dysfunction   . Essential hypertension, benign   . Fatigue   . GERD (gastroesophageal reflux disease)   . High cholesterol    "just started tx yesterday" (09/06/2017)  .  History of gout    "RX prn" (09/06/2017)  . Muscular chest pain   . Obesity   . OSA on CPAP   . Plantar fasciitis   . Right bundle branch block   . Testosterone deficiency   . Type II diabetes mellitus ([place])    "started tx 08/28/2017"    Past Surgical History:  Procedure Laterality Date  . BACK SURGERY    . CORONARY ANGIOPLASTY WITH STENT PLACEMENT  09/06/2017   "3 stents"  . CORONARY STENT INTERVENTION N/A 09/06/2017   Procedure: CORONARY STENT INTERVENTION;  Surgeon: Jettie Booze, MD;  Location: [facility];  Service: Cardiovascular;  Laterality: N/A;  . CORONARY STENT INTERVENTION N/A 02/15/2018   Procedure: CORONARY STENT INTERVENTION;  Surgeon: Jettie Booze, MD;  Location: [place];  Service: Cardiovascular;  Laterality: N/A;  . KNEE ARTHROSCOPY Right   . LEFT HEART CATH AND CORONARY ANGIOGRAPHY N/A 09/06/2017   Procedure: LEFT HEART CATH AND CORONARY ANGIOGRAPHY;  Surgeon: Jettie Booze, MD;  Location: [facility];  Service: Cardiovascular;  Laterality: N/A;  . LEFT HEART CATH AND CORONARY ANGIOGRAPHY N/A 02/15/2018   Procedure: LEFT HEART CATH AND CORONARY ANGIOGRAPHY;  Surgeon: Jettie Booze, MD;  Location: [facility];  Service: Cardiovascular;  Laterality: N/A;  . LEFT HEART CATH AND CORONARY ANGIOGRAPHY N/A 06/27/2018   Procedure: LEFT HEART CATH AND  CORONARY ANGIOGRAPHY;  Surgeon: Jettie Booze, MD;  Location: [facility];  Service: Cardiovascular;  Laterality: N/A;  . LUMBAR SPINE SURGERY  ~ 2001   Intradiscal Electrothermoplasty  . VASECTOMY  1995    Current Medications: Current Meds  Medication Sig  . allopurinol (ZYLOPRIM) 100 MG tablet Take 100 mg by mouth daily as needed (for gout).   Marland Kitchen aspirin EC 81 MG tablet Take 1 tablet (81 mg total) by mouth daily.  Marland Kitchen BRILINTA 90 MG TABS tablet TAKE 1 TABLET BY MOUTH TWICE A DAY  . carvedilol (COREG) 12.5 MG tablet Take 1 tablet (12.5 mg total) by mouth 2  (two) times daily.  . furosemide (LASIX) 40 MG tablet Take 2 tablets (80 mg total) by mouth daily.  . metFORMIN (GLUCOPHAGE-XR) 500 MG 24 hr tablet Take 1-2 tablets (500-1,000 mg total) by mouth See admin instructions. Take 500 mg by mouth in the morning and take 1000 mg by mouth in the evening  . nitroGLYCERIN (NITROSTAT) 0.4 MG SL tablet Place 1 tablet (0.4 mg total) under the tongue every 5 (five) minutes as needed for chest pain.  Marland Kitchen omeprazole (PRILOSEC) 40 MG capsule Take 1 capsule (40 mg total) by mouth daily.  . ondansetron (ZOFRAN ODT) 4 MG disintegrating tablet 4mg  ODT q4 hours prn nausea/vomit  . Polyethylene Glycol 400 (BLINK TEARS OP) Place 2 drops into both eyes 2 (two) times daily as needed (for dry eyes).  . Potassium Chloride ER 20 MEQ TBCR TAKE 1 TABLET BY MOUTH EVERY DAY  . ranolazine (RANEXA) 500 MG 12 hr tablet Take 1 tablet (500 mg total) by mouth 2 (two) times daily.  . rosuvastatin (CRESTOR) 40 MG tablet Take 1 tablet (40 mg total) by mouth every evening.  . sacubitril-valsartan (ENTRESTO) 49-51 MG Take 1 tablet by mouth 2 (two) times daily.  Marland Kitchen spironolactone (ALDACTONE) 25 MG tablet TAKE 0.5 TABLETS BY MOUTH EVERY DAY     Allergies:   Patient has no known allergies.   Social History   Socioeconomic History  . Marital status: Married    Spouse name: Not on file  . Number of children: Not on file  . Years of education: Not on file  . Highest education level: Not on file  Occupational History  . Not on file  Social Needs  . Financial resource strain: Not on file  . Food insecurity    Worry: Not on file    Inability: Not on file  . Transportation needs    Medical: Not on file    Non-medical: Not on file  Tobacco Use  . Smoking status: Never Smoker  . Smokeless tobacco: Never Used  Substance and Sexual Activity  . Alcohol use: No    Frequency: Never  . Drug use: Never  . Sexual activity: Not on file  Lifestyle  . Physical activity    Days per week: Not  on file    Minutes per session: Not on file  . Stress: Not on file  Relationships  . Social Herbalist on phone: Not on file    Gets together: Not on file    Attends religious service: Not on file    Active member of club or organization: Not on file    Attends meetings of clubs or organizations: Not on file    Relationship status: Not on file  Other Topics Concern  . Not on file  Social History Narrative  . Not on file  Family History: The patient's family history includes Brain cancer in his mother; Diabetes Mellitus II in his brother and paternal grandfather; Lung cancer in his mother; Lymphoma in his father. ROS:   Please see the history of present illness.    All 14 point review of systems negative except as described per history of present illness  EKGs/Labs/Other Studies Reviewed:      Recent Labs: 07/02/2018: NT-Pro BNP 143 07/12/2018: Magnesium 2.2 09/02/2018: BNP 113.5 12/20/2018: ALT 23; BUN 15; Creatinine, Ser 1.44; Hemoglobin 14.6; Platelets 260; Potassium 4.3; Sodium 138  Recent Lipid Panel    Component Value Date/Time   CHOL 124 03/27/2018 0940   TRIG 218 (H) 03/27/2018 0940   HDL 35 (L) 03/27/2018 0940   CHOLHDL 3.5 03/27/2018 0940   LDLCALC 45 03/27/2018 0940    Physical Exam:    VS:  BP 110/64   Pulse 94   Wt (!) 380 lb (172.4 kg)   SpO2 96%   BMI 47.50 kg/m     Wt Readings from Last 3 Encounters:  01/14/19 (!) 380 lb (172.4 kg)  12/20/18 (!) 369 lb (167.4 kg)  10/03/18 (!) 373 lb (169.2 kg)     GEN:  Well nourished, well developed in no acute distress HEENT: Normal NECK: No JVD; No carotid bruits LYMPHATICS: No lymphadenopathy CARDIAC: RRR, no murmurs, no rubs, no gallops RESPIRATORY:  Clear to auscultation without rales, wheezing or rhonchi  ABDOMEN: Soft, non-tender, non-distended MUSCULOSKELETAL:  No edema; No deformity  SKIN: Warm and dry LOWER EXTREMITIES: no swelling NEUROLOGIC:  Alert and oriented x 3 PSYCHIATRIC:   Normal affect   ASSESSMENT:    1. Ischemic cardiomyopathy   2. Essential hypertension, benign   3. CAD S/P percutaneous coronary angioplasty   4. Class 3 severe obesity due to excess calories without serious comorbidity in adult, unspecified BMI ([place])   5. Shortness of breath    PLAN:    In order of problems listed above:  1. Ischemic cardiomyopathy on appropriate medical therapy in spite of that deteriorating referral will be made to advanced congestive heart failure clinic 2. Essential hypertension blood pressure well controlled continue present management. 3. CAD.  Present last cardiac catheterization in January reviewed no critical lesions at that time 4. Shortness of breath overall he is getting worse. 5.    Medication Adjustments/Labs and Tests Ordered: Current medicines are reviewed at length with the patient today.  Concerns regarding medicines are outlined above.  No orders of the defined types were placed in this encounter.  Medication changes: No orders of the defined types were placed in this encounter.   Signed, Park Liter, MD, [facility] 01/14/2019 2:53 PM    [place]

## 2019-01-17 ENCOUNTER — Ambulatory Visit: Payer: BC Managed Care – PPO | Admitting: Cardiology

## 2019-02-14 ENCOUNTER — Other Ambulatory Visit: Payer: Self-pay

## 2019-02-14 ENCOUNTER — Ambulatory Visit (INDEPENDENT_AMBULATORY_CARE_PROVIDER_SITE_OTHER): Payer: BC Managed Care – PPO | Admitting: Cardiology

## 2019-02-14 ENCOUNTER — Telehealth: Payer: Self-pay | Admitting: Emergency Medicine

## 2019-02-14 ENCOUNTER — Encounter: Payer: Self-pay | Admitting: Cardiology

## 2019-02-14 VITALS — BP 120/64 | HR 84 | Ht 75.0 in | Wt 386.0 lb

## 2019-02-14 DIAGNOSIS — I2510 Atherosclerotic heart disease of native coronary artery without angina pectoris: Secondary | ICD-10-CM

## 2019-02-14 DIAGNOSIS — I10 Essential (primary) hypertension: Secondary | ICD-10-CM

## 2019-02-14 DIAGNOSIS — G4733 Obstructive sleep apnea (adult) (pediatric): Secondary | ICD-10-CM | POA: Diagnosis not present

## 2019-02-14 DIAGNOSIS — I255 Ischemic cardiomyopathy: Secondary | ICD-10-CM

## 2019-02-14 MED ORDER — CLOPIDOGREL BISULFATE 75 MG PO TABS: 75.0000 mg | ORAL_TABLET | Freq: Every day | ORAL | 1 refills | Status: DC

## 2019-02-14 NOTE — Telephone Encounter (Signed)
Patient called and informed to stop brilinta on 02/17/2019 and start plavix 75 mg daily. Per Dr. Agustin Cree. He verbally understands. No further questions.

## 2019-02-14 NOTE — Addendum Note (Signed)
Addended by: Ashok Norris on: 02/14/2019 03:24 PM   Modules accepted: Orders

## 2019-02-14 NOTE — Progress Notes (Signed)
Cardiology Office Note:    Date:  02/14/2019   ID:  Joe [name], DOB [birth year], MRN [id number]  PCP:  Cyndi Bender, PA-C  Cardiologist:  Jenne Campus, MD    Referring MD: Cyndi Bender, PA-C   Chief Complaint  Patient presents with  . Follow-up  Still short of breath  History of Present Illness:    Joe [name] is a 47 y.o. male    He did have a stent to mid and distal LAD in March 2019 after that he recovered quite nicely did very well however gradually started deteriorating.  Eventually end up having another cardiac catheterization in January of this year which showed 40% distal LAD, stents were patent, mid RCA got 25% lesion right PDA had 50% stenosis the circumflex artery were open.  No intervention has been required.  Since that time he gradually deteriorated.  His last echocardiogram showed ejection fraction 20 to 25% in spite of optimal medical therapy which include Entresto Aldactone as well as Coreg.  He is symptomatic he is New York Heart Association class is 3.  An effort will bring significant shortness of breath.  There is no swelling of lower extremities.    He comes today to my office for follow-up complaining of being tired and exhausted.  Interesting scenario he said in the morning he gets up and is very tired and exhausted then 1 day progressed he started getting stronger and stronger.  He does have sleep apnea and he does not use any CPAP mask I think that is an opportunity to make him feel better to manage his CPAP better.  He is scheduled to see our EP team with consideration for ICD by V pacing.  For some reason appointment with advanced congestive heart failure did not happen we will work on that.  He does not have swelling of lower extremities shortness of breath and fatigue is his leading  symptom.  Past Medical History:  Diagnosis Date  . Abnormal EKG   . Body mass index 45.0-49.9, adult ([place])   . CHF (congestive heart failure) ([place])   . Complication of  anesthesia 1995   "had hard time waking up; breathing w/vasectomy"  . Coronary artery disease   . Erectile dysfunction   . Essential hypertension, benign   . Fatigue   . GERD (gastroesophageal reflux disease)   . High cholesterol    "just started tx yesterday" (09/06/2017)  . History of gout    "RX prn" (09/06/2017)  . Muscular chest pain   . Obesity   . OSA on CPAP   . Plantar fasciitis   . Right bundle branch block   . Testosterone deficiency   . Type II diabetes mellitus ([place])    "started tx 08/28/2017"    Past Surgical History:  Procedure Laterality Date  . BACK SURGERY    . CORONARY ANGIOPLASTY WITH STENT PLACEMENT  09/06/2017   "3 stents"  . CORONARY STENT INTERVENTION N/A 09/06/2017   Procedure: CORONARY STENT INTERVENTION;  Surgeon: Jettie Booze, MD;  Location: [facility];  Service: Cardiovascular;  Laterality: N/A;  . CORONARY STENT INTERVENTION N/A 02/15/2018   Procedure: CORONARY STENT INTERVENTION;  Surgeon: Jettie Booze, MD;  Location: [facility];  Service: Cardiovascular;  Laterality: N/A;  . KNEE ARTHROSCOPY Right   . LEFT HEART CATH AND CORONARY ANGIOGRAPHY N/A 09/06/2017   Procedure: LEFT HEART CATH AND CORONARY ANGIOGRAPHY;  Surgeon: Jettie Booze, MD;  Location: [facility];  Service: Cardiovascular;  Laterality: N/A;  . LEFT HEART CATH AND CORONARY ANGIOGRAPHY N/A 02/15/2018   Procedure: LEFT HEART CATH AND CORONARY ANGIOGRAPHY;  Surgeon: Jettie Booze, MD;  Location: [facility];  Service: Cardiovascular;  Laterality: N/A;  . LEFT HEART CATH AND CORONARY ANGIOGRAPHY N/A 06/27/2018   Procedure: LEFT HEART CATH AND CORONARY ANGIOGRAPHY;  Surgeon: Jettie Booze, MD;  Location: [facility];  Service: Cardiovascular;  Laterality: N/A;  . LUMBAR SPINE SURGERY  ~ 2001   Intradiscal Electrothermoplasty  . VASECTOMY  1995    Current Medications: Current Meds  Medication Sig  . allopurinol  (ZYLOPRIM) 100 MG tablet Take 100 mg by mouth daily as needed (for gout).   Marland Kitchen aspirin EC 81 MG tablet Take 1 tablet (81 mg total) by mouth daily.  . carvedilol (COREG) 12.5 MG tablet Take 1 tablet (12.5 mg total) by mouth 2 (two) times daily.  . furosemide (LASIX) 40 MG tablet Take 2 tablets (80 mg total) by mouth daily.  . metFORMIN (GLUCOPHAGE-XR) 500 MG 24 hr tablet Take 1-2 tablets (500-1,000 mg total) by mouth See admin instructions. Take 500 mg by mouth in the morning and take 1000 mg by mouth in the evening  . omeprazole (PRILOSEC) 40 MG capsule Take 1 capsule (40 mg total) by mouth daily.  . ondansetron (ZOFRAN ODT) 4 MG disintegrating tablet 4mg  ODT q4 hours prn nausea/vomit  . Polyethylene Glycol 400 (BLINK TEARS OP) Place 2 drops into both eyes 2 (two) times daily as needed (for dry eyes).  . Potassium Chloride ER 20 MEQ TBCR TAKE 1 TABLET BY MOUTH EVERY DAY  . ranolazine (RANEXA) 500 MG 12 hr tablet Take 1 tablet (500 mg total) by mouth 2 (two) times daily.  . rosuvastatin (CRESTOR) 40 MG tablet Take 1 tablet (40 mg total) by mouth every evening.  . sacubitril-valsartan (ENTRESTO) 49-51 MG Take 1 tablet by mouth 2 (two) times daily.  Marland Kitchen spironolactone (ALDACTONE) 25 MG tablet TAKE 0.5 TABLETS BY MOUTH EVERY DAY  . ticagrelor (BRILINTA) 90 MG TABS tablet Take 1 tablet (90 mg total) by mouth 2 (two) times daily.     Allergies:   Patient has no known allergies.   Social History   Socioeconomic History  . Marital status: Married    Spouse name: Not on file  . Number of children: Not on file  . Years of education: Not on file  . Highest education level: Not on file  Occupational History  . Not on file  Social Needs  . Financial resource strain: Not on file  . Food insecurity    Worry: Not on file    Inability: Not on file  . Transportation needs    Medical: Not on file    Non-medical: Not on file  Tobacco Use  . Smoking status: Never Smoker  . Smokeless tobacco: Never Used   Substance and Sexual Activity  . Alcohol use: No    Frequency: Never  . Drug use: Never  . Sexual activity: Not on file  Lifestyle  . Physical activity    Days per week: Not on file    Minutes per session: Not on file  . Stress: Not on file  Relationships  . Social Herbalist on phone: Not on file    Gets together: Not on file    Attends religious service: Not on file    Active member of club or organization: Not on file  Attends meetings of clubs or organizations: Not on file    Relationship status: Not on file  Other Topics Concern  . Not on file  Social History Narrative  . Not on file     Family History: The patient's family history includes Brain cancer in his mother; Diabetes Mellitus II in his brother and paternal grandfather; Lung cancer in his mother; Lymphoma in his father. ROS:   Please see the history of present illness.    All 14 point review of systems negative except as described per history of present illness  EKGs/Labs/Other Studies Reviewed:      Recent Labs: 07/02/2018: NT-Pro BNP 143 07/12/2018: Magnesium 2.2 09/02/2018: BNP 113.5 12/20/2018: ALT 23; BUN 15; Creatinine, Ser 1.44; Hemoglobin 14.6; Platelets 260; Potassium 4.3; Sodium 138  Recent Lipid Panel    Component Value Date/Time   CHOL 124 03/27/2018 0940   TRIG 218 (H) 03/27/2018 0940   HDL 35 (L) 03/27/2018 0940   CHOLHDL 3.5 03/27/2018 0940   LDLCALC 45 03/27/2018 0940    Physical Exam:    VS:  BP 120/64   Pulse 84   Ht 6\' 3"  (1.905 m)   Wt (!) 386 lb (175.1 kg)   SpO2 96%   BMI 48.25 kg/m     Wt Readings from Last 3 Encounters:  02/14/19 (!) 386 lb (175.1 kg)  01/14/19 (!) 380 lb (172.4 kg)  12/20/18 (!) 369 lb (167.4 kg)     GEN:  Well nourished, well developed in no acute distress HEENT: Normal NECK: No JVD; No carotid bruits LYMPHATICS: No lymphadenopathy CARDIAC: RRR, no murmurs, no rubs, no gallops RESPIRATORY:  Clear to auscultation without rales,  wheezing or rhonchi  ABDOMEN: Soft, non-tender, non-distended MUSCULOSKELETAL:  No edema; No deformity  SKIN: Warm and dry LOWER EXTREMITIES: no swelling NEUROLOGIC:  Alert and oriented x 3 PSYCHIATRIC:  Normal affect   ASSESSMENT:    1. Ischemic cardiomyopathy   2. Essential hypertension, benign   3. Coronary artery disease involving native coronary artery of native heart without angina pectoris   4. Obstructive sleep apnea    PLAN:    In order of problems listed above:  1. Ischemic cardiomyopathy.  I will check Chem-7 today if Chem-7 is fine we will try to increase the dose of Entresto.  We will continue with carvedilol as well as spironolactone. 2. Essential hypertension blood pressure well controlled we will continue present management. 3. Coronary artery disease status post PTCA and stenting done on 30 August of 2019 to circumflex lesion.  He is reaching 1 year anniversary of the stenting will be able to discontinue his Brilinta however because of his multiple risk factors I think we should continue either Plavix or low dose of Xarelto.  He preferred Plavix. 4. Obstructive sleep apnea.  I will be referring him to sleep specialist.   Medication Adjustments/Labs and Tests Ordered: Current medicines are reviewed at length with the patient today.  Concerns regarding medicines are outlined above.  No orders of the defined types were placed in this encounter.  Medication changes: No orders of the defined types were placed in this encounter.   Signed, Park Liter, MD, [facility] 02/14/2019 3:13 PM    [place]

## 2019-02-14 NOTE — Patient Instructions (Signed)
Medication Instructions:  Your physician recommends that you continue on your current medications as directed. Please refer to the Current Medication list given to you today.  If you need a refill on your cardiac medications before your next appointment, please call your pharmacy.   Lab work: Your physician recommends that you return for lab work today: bmp   If you have labs (blood work) drawn today and your tests are completely normal, you will receive your results only by: Marland Kitchen MyChart Message (if you have MyChart) OR . A paper copy in the mail If you have any lab test that is abnormal or we need to change your treatment, we will call you to review the results.  Testing/Procedures: None.   Follow-Up: At [facility], you and your health needs are our priority.  As part of our continuing mission to provide you with exceptional heart care, we have created designated Provider Care Teams.  These Care Teams include your primary Cardiologist (physician) and Advanced Practice Providers (APPs -  Physician Assistants and Nurse Practitioners) who all work together to provide you with the care you need, when you need it. You will need a follow up appointment in 1 months.  Please call our office 2 months in advance to schedule this appointment.  You may see Jenne Campus, MD or another member of our [place] Provider Team in [place]: Shirlee More, MD . Jyl Heinz, MD  Any Other Special Instructions Will Be Listed Below (If Applicable).  Dr. Agustin Cree has referred you to sleep specialist. They should call you within one week, if not please call our office.

## 2019-02-15 LAB — BASIC METABOLIC PANEL
BUN/Creatinine Ratio: 13 (ref 9–20)
BUN: 15 mg/dL (ref 6–24)
CO2: 24 mmol/L (ref 20–29)
Calcium: 8.6 mg/dL — ABNORMAL LOW (ref 8.7–10.2)
Chloride: 101 mmol/L (ref 96–106)
Creatinine, Ser: 1.12 mg/dL (ref 0.76–1.27)
GFR calc Af Amer: 89 mL/min/{1.73_m2} (ref >59)
GFR calc non Af Amer: 77 mL/min/{1.73_m2} (ref >59)
Glucose: 228 mg/dL — ABNORMAL HIGH (ref 65–99)
Potassium: 4.1 mmol/L (ref 3.5–5.2)
Sodium: 139 mmol/L (ref 134–144)

## 2019-02-17 ENCOUNTER — Telehealth: Payer: Self-pay | Admitting: Emergency Medicine

## 2019-02-17 NOTE — Telephone Encounter (Signed)
Left message with Kevan Rosebush, RN regaurding patient's referral to heart failure clinic.

## 2019-02-19 DIAGNOSIS — C4491 Basal cell carcinoma of skin, unspecified: Secondary | ICD-10-CM | POA: Insufficient documentation

## 2019-02-19 HISTORY — DX: Basal cell carcinoma of skin, unspecified: C44.91

## 2019-02-20 ENCOUNTER — Other Ambulatory Visit: Payer: Self-pay | Admitting: Cardiology

## 2019-02-25 ENCOUNTER — Telehealth: Payer: Self-pay | Admitting: Emergency Medicine

## 2019-02-25 DIAGNOSIS — I5021 Acute systolic (congestive) heart failure: Secondary | ICD-10-CM

## 2019-02-25 MED ORDER — ENTRESTO 97-103 MG PO TABS: 1.0000 | ORAL_TABLET | Freq: Two times a day (BID) | ORAL | 2 refills | Status: DC

## 2019-02-25 NOTE — Telephone Encounter (Signed)
Patient informed of lab results and Dr. Wendy Poet recommendation to increase entresto to 97/103 twice daily. He will also need labs in 1 week. Patient verbally understands. No further questions.

## 2019-03-03 ENCOUNTER — Encounter: Payer: Self-pay | Admitting: Cardiology

## 2019-03-03 ENCOUNTER — Other Ambulatory Visit: Payer: Self-pay

## 2019-03-03 ENCOUNTER — Ambulatory Visit (INDEPENDENT_AMBULATORY_CARE_PROVIDER_SITE_OTHER): Payer: BC Managed Care – PPO | Admitting: Cardiology

## 2019-03-03 VITALS — BP 108/72 | HR 98 | Ht 75.0 in | Wt 387.0 lb

## 2019-03-03 DIAGNOSIS — Z01812 Encounter for preprocedural laboratory examination: Secondary | ICD-10-CM | POA: Diagnosis not present

## 2019-03-03 DIAGNOSIS — I255 Ischemic cardiomyopathy: Secondary | ICD-10-CM | POA: Diagnosis not present

## 2019-03-03 NOTE — Progress Notes (Signed)
Electrophysiology Office Note   Date:  03/03/2019   ID:  Joe [name], DOB [birth year], MRN [id number]  PCP:  Cyndi Bender, PA-C  Cardiologist: Agustin Cree Primary Electrophysiologist:  Nataleah Scioneaux Meredith Leeds, MD    Chief Complaint: CHF   History of Present Illness: Joe [name] is a 48 y.o. male who is being seen today for the evaluation of CHF at the request of Park Liter, MD. Presenting today for electrophysiology evaluation.  He has a history of chronic systolic heart failure, coronary artery disease, hypertension, hyperlipidemia, obesity, OSA on CPAP, and type 2 diabetes.  He currently is on optimal medical therapy with Entresto, Aldactone, and Coreg.  He has NYHA class III symptoms.  His main complaint today is fatigue.  He is certainly fatigue in the morning.  He does not get morning headaches, but he does have significant issues with shortness of breath.  Today, he denies symptoms of palpitations, chest pain, orthopnea, PND, lower extremity edema, claudication, dizziness, presyncope, syncope, bleeding, or neurologic sequela. The patient is tolerating medications without difficulties.    Past Medical History:  Diagnosis Date   Abnormal EKG    Body mass index 45.0-49.9, adult (HCC)    CHF (congestive heart failure) (HCC)    Complication of anesthesia 1995   "had hard time waking up; breathing w/vasectomy"   Coronary artery disease    Erectile dysfunction    Essential hypertension, benign    Fatigue    GERD (gastroesophageal reflux disease)    High cholesterol    "just started tx yesterday" (09/06/2017)   History of gout    "RX prn" (09/06/2017)   Muscular chest pain    Obesity    OSA on CPAP    Plantar fasciitis    Right bundle branch block    Testosterone deficiency    Type II diabetes mellitus ([place])    "started tx 08/28/2017"   Past Surgical History:  Procedure Laterality Date   BACK SURGERY     CORONARY ANGIOPLASTY WITH STENT  PLACEMENT  09/06/2017   "3 stents"   CORONARY STENT INTERVENTION N/A 09/06/2017   Procedure: CORONARY STENT INTERVENTION;  Surgeon: Jettie Booze, MD;  Location: [facility];  Service: Cardiovascular;  Laterality: N/A;   CORONARY STENT INTERVENTION N/A 02/15/2018   Procedure: CORONARY STENT INTERVENTION;  Surgeon: Jettie Booze, MD;  Location: [facility];  Service: Cardiovascular;  Laterality: N/A;   KNEE ARTHROSCOPY Right    LEFT HEART CATH AND CORONARY ANGIOGRAPHY N/A 09/06/2017   Procedure: LEFT HEART CATH AND CORONARY ANGIOGRAPHY;  Surgeon: Jettie Booze, MD;  Location: [facility];  Service: Cardiovascular;  Laterality: N/A;   LEFT HEART CATH AND CORONARY ANGIOGRAPHY N/A 02/15/2018   Procedure: LEFT HEART CATH AND CORONARY ANGIOGRAPHY;  Surgeon: Jettie Booze, MD;  Location: [facility];  Service: Cardiovascular;  Laterality: N/A;   LEFT HEART CATH AND CORONARY ANGIOGRAPHY N/A 06/27/2018   Procedure: LEFT HEART CATH AND CORONARY ANGIOGRAPHY;  Surgeon: Jettie Booze, MD;  Location: [facility];  Service: Cardiovascular;  Laterality: N/A;   LUMBAR SPINE SURGERY  ~ 2001   Intradiscal Electrothermoplasty   VASECTOMY  1995     Current Outpatient Medications  Medication Sig Dispense Refill   allopurinol (ZYLOPRIM) 100 MG tablet Take 100 mg by mouth daily as needed (for gout).      aspirin EC 81 MG tablet Take 1 tablet (81 mg total) by mouth daily. 90 tablet 3  carvedilol (COREG) 12.5 MG tablet TAKE 1 TABLET BY MOUTH TWICE A DAY 180 tablet 2   clopidogrel (PLAVIX) 75 MG tablet Take 1 tablet (75 mg total) by mouth daily. 90 tablet 1   furosemide (LASIX) 40 MG tablet Take 2 tablets (80 mg total) by mouth daily. 180 tablet 1   metFORMIN (GLUCOPHAGE-XR) 500 MG 24 hr tablet Take 1-2 tablets (500-1,000 mg total) by mouth See admin instructions. Take 500 mg by mouth in the morning and take 1000 mg by mouth in the evening   6   omeprazole (PRILOSEC) 40 MG capsule Take 1 capsule (40 mg total) by mouth daily. 90 capsule 1   ondansetron (ZOFRAN ODT) 4 MG disintegrating tablet 4mg  ODT q4 hours prn nausea/vomit 20 tablet 0   Polyethylene Glycol 400 (BLINK TEARS OP) Place 2 drops into both eyes 2 (two) times daily as needed (for dry eyes).     Potassium Chloride ER 20 MEQ TBCR TAKE 1 TABLET BY MOUTH EVERY DAY 90 tablet 1   ranolazine (RANEXA) 500 MG 12 hr tablet Take 1 tablet (500 mg total) by mouth 2 (two) times daily. 60 tablet 6   rosuvastatin (CRESTOR) 40 MG tablet Take 1 tablet (40 mg total) by mouth every evening. 30 tablet 6   sacubitril-valsartan (ENTRESTO) 97-103 MG Take 1 tablet by mouth 2 (two) times daily. 60 tablet 2   spironolactone (ALDACTONE) 25 MG tablet TAKE 0.5 TABLETS BY MOUTH EVERY DAY 45 tablet 4   nitroGLYCERIN (NITROSTAT) 0.4 MG SL tablet Place 1 tablet (0.4 mg total) under the tongue every 5 (five) minutes as needed for chest pain. 25 tablet 6   ticagrelor (BRILINTA) 90 MG TABS tablet Take 1 tablet (90 mg total) by mouth 2 (two) times daily. (Patient not taking: Reported on 03/03/2019) 60 tablet 0   No current facility-administered medications for this visit.     Allergies:   Patient has no known allergies.   Social History:  The patient  reports that he has never smoked. He has never used smokeless tobacco. He reports that he does not drink alcohol or use drugs.   Family History:  The patient's family history includes Brain cancer in his mother; Diabetes Mellitus II in his brother and paternal grandfather; Lung cancer in his mother; Lymphoma in his father.    ROS:  Please see the history of present illness.   Otherwise, review of systems is positive for none.   All other systems are reviewed and negative.    PHYSICAL EXAM: VS:  BP 108/72    Pulse 98    Ht 6\' 3"  (1.905 m)    Wt (!) 387 lb (175.5 kg)    BMI 48.37 kg/m  , BMI Body mass index is 48.37 kg/m. GEN: Well nourished,  well developed, in no acute distress  HEENT: normal  Neck: no JVD, carotid bruits, or masses Cardiac: iRRR; no murmurs, rubs, or gallops,no edema  Respiratory:  clear to auscultation bilaterally, normal work of breathing GI: soft, nontender, nondistended, + BS MS: no deformity or atrophy  Skin: warm and dry Neuro:  Strength and sensation are intact Psych: euthymic mood, full affect  EKG:  EKG is ordered today. Personal review of the ekg ordered shows sinus rhythm, right bundle branch block, left anterior fascicular block, PVCs  Recent Labs: 07/02/2018: NT-Pro BNP 143 07/12/2018: Magnesium 2.2 09/02/2018: BNP 113.5 12/20/2018: ALT 23; Hemoglobin 14.6; Platelets 260 02/14/2019: BUN 15; Creatinine, Ser 1.12; Potassium 4.1; Sodium 139    Lipid  Panel     Component Value Date/Time   CHOL 124 03/27/2018 0940   TRIG 218 (H) 03/27/2018 0940   HDL 35 (L) 03/27/2018 0940   CHOLHDL 3.5 03/27/2018 0940   LDLCALC 45 03/27/2018 0940     Wt Readings from Last 3 Encounters:  03/03/19 (!) 387 lb (175.5 kg)  02/14/19 (!) 386 lb (175.1 kg)  01/14/19 (!) 380 lb (172.4 kg)      Other studies Reviewed: Additional studies/ records that were reviewed today include: TTE 12/27/18  Review of the above records today demonstrates:   1. The left ventricle has severely reduced systolic function, with severe diffuse hypokinesia and an ejection fraction of 20-25%. The cavity size was moderately dilated. There is severe concentric left ventricular hypertrophy. Left ventricular diastolic  Doppler parameters are indeterminate.  2. The right ventricle has moderately reduced systolic function. The cavity was mildly enlarged. There is no increase in right ventricular wall thickness.  3. The aortic root, ascending aorta and aortic arch are normal in size and structure.  4. No evidence of mitral valve stenosis.  5. The aortic valve has an indeterminate number of cusps. No stenosis of the aortic valve.  LHC  06/2018  Dist LAD lesion is 40% stenosed. Diffusely diseased apical LAD.  LAD stents widely patent.  Previously placed Mid Cx drug eluting stent is widely patent.  Previously placed Prox Cx drug eluting stent is widely patent.  Mid RCA lesion is 25% stenosed.  Ost RPDA to RPDA lesion is 50% stenosed.  Dist RCA lesion is 25% stenosed with 25% stenosed side branch in Post Atrio.  LV end diastolic pressure is mildly elevated. LVEDP 19 mm Hg.  There is no aortic valve stenosis.  ASSESSMENT AND PLAN:  1.  Systolic heart failure due to ischemic cardiomyopathy: Currently on optimal medical therapy with Coreg, Entresto, and Aldactone.  He would likely benefit from an ICD.  Risks and benefits were discussed which include bleeding, tamponade, infection, pneumothorax, among others.  He understands these risks and has agreed to the procedure.  2.  Hypertension: Blood pressure well controlled  3.  Coronary artery disease: No current chest pain.  Continue with current management.  4.  Obstructive sleep apnea: He does not wear CPAP, but is amenable to referral for further evaluation.  He has been diagnosed with sleep apnea which I feel is likely a large reason for his symptomatology.  Case discussed with referring cardiologist  Current medicines are reviewed at length with the patient today.   The patient does not have concerns regarding his medicines.  The following changes were made today:  none  Labs/ tests ordered today include:  Orders Placed This Encounter  Procedures   Basic metabolic panel   CBC   EKG 12-Lead     Disposition:   FU with Amaryllis Malmquist 3 months  Signed, Darvis Croft Meredith Leeds, MD  03/03/2019 3:05 PM     [facility] [address] [place] [address] [phone] (office) [phone] (fax)

## 2019-03-03 NOTE — Patient Instructions (Signed)
Medication Instructions:  Your physician recommends that you continue on your current medications as directed. Please refer to the Current Medication list given to you today.     * If you need a refill on your cardiac medications before your next appointment, please call your pharmacy. *   Labwork: Pre procedure lab work today: BMET & CBC If you have labs (blood work) drawn today and your tests are completely normal, you will receive your results only by:  [employer] (if you have MyChart) OR  A paper copy in the mail If you have any lab test that is abnormal or we need to change your treatment, we will call you to review the results.   Testing/Procedures: Your physician has recommended that you have a defibrillator inserted. An implantable cardioverter defibrillator (ICD) is a small device that is placed in your chest or, in rare cases, your abdomen. This device uses electrical pulses or shocks to help control life-threatening, irregular heartbeats that could lead the heart to suddenly stop beating (sudden cardiac arrest). Leads are attached to the ICD that goes into your heart. This is done in the hospital and usually requires an overnight stay. Please follow the instructions below, located under the special instructions section.   Follow-Up: Your physician recommends that you schedule a wound check appointment 15 days, after your procedure on 03/19/19, with the device clinic.  Your physician recommends that you schedule a follow up appointment in 91 days, after your procedure on 03/19/19, with Dr. Curt Bears.  * Please note that any paperwork needing to be filled out by the provider will need to be addressed at the front desk prior to seeing the provider.  Please note that any FMLA, disability or other documents regarding health condition is subject to a $25.00 charge that must be received prior to completion of paperwork in the form of a money order or check. *  Thank you for choosing  [facility]!!   Trinidad Curet, RN [phone]   Any Other Special Instructions Will Be Listed Below (If Applicable).    Implantable Device Instructions  You are scheduled for: Defibrillator Implant on 03/19/19 with Dr. Curt Bears.  1.   Pre procedure testing-             A.  LAB WORK--- On 03/03/19.  You do not need to be fasting.               B. COVID TEST-- On 03/15/19 @  - You will go to [facility] hospital ([place]) for your Covid testing.   This is a drive thru test site.  There will be multiple testing areas.  Be sure to share with the first checkpoint that you are there for pre-procedure/surgery testing. This will put you into the right (yellow) lane that leads to the PAT testing team.   Stay in your car and the nurse team will come to your car to test you.  After you are tested please go home and self quarantine until the day of your procedure.    2. On the day of your procedure 03/19/19 you will go to [facility] [phone]) at 9:30 am.  Dennis Bast will go to the main entrance [address] [employer]) and enter where the [employer] are.  You will check in at ADMITTING.  You may have one support person come in to the hospital with you.  They will be asked to wait in the waiting room.  3.   Do not eat or drink after midnight prior to your procedure.   4.   On the morning of your procedure do NOT take any medication.  5.  The night before your procedure and the morning of your procedure scrub your neck/chest with surgical scrub.  An instruction letter is included with this letter.    5.  Plan for an overnight stay.  If you use your phone frequently bring your phone charger.  When you are discharged you will need someone to drive you home.   6.  You will follow up with the [place] clinic 10-14 days after your procedure. You will follow up with Dr. Curt Bears 91 days after your procedure.  These appointments will be made for you.   * If you  have ANY questions after you get home, please call the office [phone] and ask for Cavon Nicolls RN or send a MyChart message.     [facility] - Preparing For Surgery  Before surgery, you can play an important role. Because skin is not sterile, your skin needs to be as free of germs as possible. You can reduce the number of germs on your skin by washing with CHG (chlorahexidine gluconate) Soap before surgery.  CHG is an antiseptic cleaner which kills germs and bonds with the skin to continue killing germs even after washing.   Please do not use if you have an allergy to CHG or antibacterial soaps.  If your skin becomes reddened/irritated stop using the CHG.   Do not shave (including legs and underarms) for at least 48 hours prior to first CHG shower.  It is OK to shave your face.  Please follow these instructions carefully:  1.  Shower the night before surgery and the morning of surgery with CHG.  2.  If you choose to wash your hair, wash your hair first as usual with your normal shampoo.  3.  After you shampoo, rinse your hair and body thoroughly to remove the shampoo.  4.  Use CHG as you would any other liquid soap.  You can apply CHG directly to the skin and wash gently with a clean washcloth. 5.  Apply the CHG Soap to your body ONLY FROM THE NECK DOWN.  Do not use on open wounds or open sores.  Avoid contact with your eyes, ears, mouth and genitals (private parts).  Wash genitals (private parts) with your normal soap.  6.  Wash thoroughly, paying special attention to the area where your surgery will be performed.  7.  Thoroughly rinse your body with warm water from the neck down.   8.  DO NOT shower/wash with your normal soap after using and rinsing off the CHG soap.  9.  Pat yourself dry with a clean towel.           10.  Wear clean pajamas.           11.  Place clean sheets on your bed the night of your first shower and do not sleep with pets.  Day of Surgery: Do not apply any  deodorants/lotions.  Please wear clean clothes to the hospital/surgery center.     Cardioverter Defibrillator Implantation An implantable cardioverter defibrillator (ICD) is a small, lightweight, battery-powered device that is placed (implanted) under the skin in the chest or abdomen. Your caregiver may prescribe an ICD if:  You have had an irregular heart rhythm (arrhythmia) that originated in the lower chambers of the heart (ventricles).  Your heart has been damaged by a disease (such as coronary artery disease) or heart condition (such as a heart attack). An ICD consists of a battery that lasts several years, a small computer called a pulse generator, and wires called leads that go into the heart. It is used to detect and correct two dangerous arrhythmias: a rapid heart rhythm (tachycardia) and an arrhythmia in which the ventricles contract in an uncoordinated way (fibrillation). When an ICD detects tachycardia, it sends an electrical signal to the heart that restores the heartbeat to normal (cardioversion). This signal is usually painless. If cardioversion does not work or if the ICD detects fibrillation, it delivers a small electrical shock to the heart (defibrillation) to restart the heart. The shock may feel like a strong jolt in the chest. ICDs may be programmed to correct other problems. Sometimes, ICDs are programmed to act as another type of implantable device called a pacemaker. Pacemakers are used to treat a slow heartbeat (bradycardia). LET YOUR CAREGIVER KNOW ABOUT:  Any allergies you have.  All medicines you are taking, including vitamins, herbs, eyedrops, and over-the-counter medicines and creams.  Previous problems you or members of your family have had with the use of anesthetics.  Any blood disorders you have had.  Other health problems you have. RISKS AND COMPLICATIONS Generally, the procedure to implant an ICD is safe. However, as with any surgical procedure,  complications can occur. Possible complications associated with implanting an ICD include:  Swelling, bleeding, or bruising at the site where the ICD was implanted.  Infection at the site where the ICD was implanted.  A reaction to medicine used during the procedure.  Nerve, heart, or blood vessel damage.  Blood clots. BEFORE THE PROCEDURE  You may need to have blood tests, heart tests, or a chest X-ray done before the day of the procedure.  Ask your caregiver about changing or stopping your regular medicines.  Make plans to have someone drive you home. You may need to stay in the hospital overnight after the procedure.  Stop smoking at least 24 hours before the procedure.  Take a bath or shower the night before the procedure. You may need to scrub your chest or abdomen with a special type of soap.  Do not eat or drink before your procedure for as long as directed by your caregiver. Ask if it is okay to take any needed medicine with a small sip of water. PROCEDURE  The procedure to implant an ICD in your chest or abdomen is usually done at a hospital in a room that has a large X-ray machine called a fluoroscope. The machine will be above you during the procedure. It will help your caregiver see your heart during the procedure. Implanting an ICD usually takes 1-3 hours. Before the procedure:   Small monitors will be put on your body. They will be used to check your heart, blood pressure, and oxygen level.  A needle will be put into a vein in your hand or arm. This is called an intravenous (IV) access tube. Fluids and medicine will flow directly into your body through the IV tube.  Your chest or abdomen will be cleaned with a germ-killing (antiseptic) solution. The area may be shaved.  You may be given medicine to help you relax (sedative).  You will be given a medicine called a local anesthetic. This medicine will make the surgical site numb while the ICD is implanted. You will be  sleepy but awake during the  procedure. After you are numb the procedure will begin. The caregiver will:  Make a small cut (incision). This will make a pocket deep under your skin that will hold the pulse generator.  Guide the leads through a large blood vessel into your heart and attach them to the heart muscles. Depending on the ICD, the leads may go into one ventricle or they may go to both ventricles and into an upper chamber of the heart (atrium).  Test the ICD.  Close the incision with stitches, glue, or staples. AFTER THE PROCEDURE  You may feel pain. Some pain is normal. It may last a few days.  You may stay in a recovery area until the local anesthetic has worn off. Your blood pressure and pulse will be checked often. You will be taken to a room where your heart will be monitored.  A chest X-ray will be taken. This is done to check that the cardioverter defibrillator is in the right place.  You may stay in the hospital overnight.  A slight bump may be seen over the skin where the ICD was placed. Sometimes, it is possible to feel the ICD under the skin. This is normal.  In the months and years afterward, your caregiver will check the device, the leads, and the battery every few months. Eventually, when the battery is low, the ICD will be replaced.   This information is not intended to replace advice given to you by your health care provider. Make sure you discuss any questions you have with your health care provider.   Document Released: 02/25/2002 Document Revised: 03/26/2013 Document Reviewed: 06/24/2012 Elsevier Interactive Patient Education 2016 [place] Defibrillator Implantation, Care After This sheet gives you information about how to care for yourself after your procedure. Your health care provider may also give you more specific instructions. If you have problems or questions, contact your health care provider. What can I expect after the  procedure? After the procedure, it is common to have:  Some pain. It may last a few days.  A slight bump over the skin where the device was placed. Sometimes, it is possible to feel the device under the skin. This is normal.  During the months and years after your procedure, your health care provider will check the device, the leads, and the battery every few months. Eventually, when the battery is low, the device will be replaced. Follow these instructions at home: Medicines  Take over-the-counter and prescription medicines only as told by your health care provider.  If you were prescribed an antibiotic medicine, take it as told by your health care provider. Do not stop taking the antibiotic even if you start to feel better. Incision care   Follow instructions from your health care provider about how to take care of your incision area. Make sure you: ? Wash your hands with soap and water before you change your bandage (dressing). If soap and water are not available, use hand sanitizer. ? Change your dressing as told by your health care provider. ? Leave stitches (sutures), skin glue, or adhesive strips in place. These skin closures may need to stay in place for 2 weeks or longer. If adhesive strip edges start to loosen and curl up, you may trim the loose edges. Do not remove adhesive strips completely unless your health care provider tells you to do that.  Check your incision area every day for signs of infection. Check for: ? More redness, swelling, or  pain. ? More fluid or blood. ? Warmth. ? Pus or a bad smell.  Do not use lotions or ointments near the incision area unless told by your health care provider.  Keep the incision area clean and dry for 2-3 days after the procedure or for as long as told by your health care provider. It takes several weeks for the incision site to heal completely.  Do not take baths, swim, or use a hot tub until your health care provider approves.  Activity  Try to walk a little every day. Exercising is important after this procedure. Also, use your shoulder on the side of the defibrillator in daily tasks that do not require a lot of motion.  For at least 6 weeks: ? Do not lift your upper arm above your shoulders. This means no tennis, golf, or swimming for this period of time. If you tend to sleep with your arm above your head, use a restraint to prevent this during sleep. ? Avoid sudden jerking, pulling, or chopping movements that pull your upper arm far away from your body.  Ask your health care provider when you may go back to work.  Check with your health care provider before you start to drive or play sports. Electric and magnetic fields  Tell all health care providers that you have a defibrillator. This may prevent them from giving you an MRI scan because strong magnets are used for that test.  If you must pass through a metal detector, quickly walk through it. Do not stop under the detector, and do not stand near it.  Avoid places or objects that have a strong electric or magnetic field, including: ? Airport Herbalist. At the airport, let officials know that you have a defibrillator. Your defibrillator ID card will let you be checked in a way that is safe for you and will not damage your defibrillator. Also, do not let a security person wave a magnetic wand near your defibrillator. That can make it stop working. ? Power plants. ? Large electrical generators. ? Anti-theft systems or electronic article surveillance (EAS). ? Radiofrequency transmission towers, such as cell phone and radio towers.  Do not use amateur (ham) radio equipment or electric (arc) welding torches. Some devices are safe to use if held at least 12 inches (30 cm) from your defibrillator. These include power tools, lawn mowers, and speakers. If you are unsure if something is safe to use, ask your health care provider.  Do not use MP3 player headphones.  They have magnets.  You may safely use electric blankets, heating pads, computers, and microwave ovens.  When using your cell phone, hold it to the ear that is on the opposite side from the defibrillator. Do not leave your cell phone in a pocket over the defibrillator. General instructions  Follow diet instructions from your health care provider, if this applies.  Always keep your defibrillator ID card with you. The card should list the implant date, device model, and manufacturer. Consider wearing a medical alert bracelet or necklace.  Have your defibrillator checked every 3-6 months or as often as told by your health care provider. Most defibrillators last for 4-8 years.  Keep all follow-up visits as told by your health care provider. This is important for your health care provider to make sure your chest is healing the way it should. Ask your health care provider when you should come back to have your stitches or staples taken out. Contact a health care provider  if:  You feel one shock in your chest.  You gain weight suddenly.  Your legs or feet swell more than they have before.  It feels like your heart is fluttering or skipping beats (heart palpitations).  You have more redness, swelling, or pain around your incision.  You have more fluid or blood coming from your incision.  Your incision feels warm to the touch.  You have pus or a bad smell coming from your incision.  You have a fever. Get help right away if:  You have chest pain.  You feel more than one shock.  You feel more short of breath than you have felt before.  You feel more light-headed than you have felt before.  Your incision starts to open up. This information is not intended to replace advice given to you by your health care provider. Make sure you discuss any questions you have with your health care provider. Document Released: 12/23/2004 Document Revised: 12/24/2015 Document Reviewed: 11/10/2015  Elsevier Interactive Patient Education  2018 [place] Discharge Instructions for  Pacemaker/Defibrillator Patients  ACTIVITY No heavy lifting or vigorous activity with your left/right arm for 6 to 8 weeks.  Do not raise your left/right arm above your head for one week.  Gradually raise your affected arm as drawn below.           __  NO DRIVING for     ; you may begin driving on     .  WOUND CARE - Keep the wound area clean and dry.  Do not get this area wet for one week. No showers for one week; you may shower on     . - The tape/steri-strips on your wound will fall off; do not pull them off.  No bandage is needed on the site.  DO  NOT apply any creams, oils, or ointments to the wound area. - If you notice any drainage or discharge from the wound, any swelling or bruising at the site, or you develop a fever > 101? F after you are discharged home, call the office at once.  SPECIAL INSTRUCTIONS - You are still able to use cellular telephones; use the ear opposite the side where you have your pacemaker/defibrillator.  Avoid carrying your cellular phone near your device. - When traveling through airports, show security personnel your identification card to avoid being screened in the metal detectors.  Ask the security personnel to use the hand wand. - Avoid arc welding equipment, MRI testing (magnetic resonance imaging), TENS units (transcutaneous nerve stimulators).  Call the office for questions about other devices. - Avoid electrical appliances that are in poor condition or are not properly grounded. - Microwave ovens are safe to be near or to operate.  ADDITIONAL INFORMATION FOR DEFIBRILLATOR PATIENTS SHOULD YOUR DEVICE GO OFF: - If your device goes off ONCE and you feel fine afterward, notify the device clinic nurses. - If your device goes off ONCE and you do not feel well afterward, call 911. - If your device goes off TWICE, call 911. - If your device goes  off [place], call 911.  DO NOT DRIVE YOURSELF OR A FAMILY MEMBER WITH A DEFIBRILLATOR TO THE HOSPITAL-CALL 911.

## 2019-03-04 LAB — BASIC METABOLIC PANEL
BUN/Creatinine Ratio: 9 (ref 9–20)
BUN: 11 mg/dL (ref 6–24)
CO2: 26 mmol/L (ref 20–29)
Calcium: 9.3 mg/dL (ref 8.7–10.2)
Chloride: 99 mmol/L (ref 96–106)
Creatinine, Ser: 1.22 mg/dL (ref 0.76–1.27)
GFR calc Af Amer: 81 mL/min/{1.73_m2} (ref >59)
GFR calc non Af Amer: 70 mL/min/{1.73_m2} (ref >59)
Glucose: 211 mg/dL — ABNORMAL HIGH (ref 65–99)
Potassium: 4.1 mmol/L (ref 3.5–5.2)
Sodium: 139 mmol/L (ref 134–144)

## 2019-03-04 LAB — CBC
Hematocrit: 40.4 % (ref 37.5–51.0)
Hemoglobin: 13.3 g/dL (ref 13.0–17.7)
MCH: 30.9 pg (ref 26.6–33.0)
MCHC: 32.9 g/dL (ref 31.5–35.7)
MCV: 94 fL (ref 79–97)
Platelets: 222 10*3/uL (ref 150–450)
RBC: 4.31 x10E6/uL (ref 4.14–5.80)
RDW: 13.3 % (ref 11.6–15.4)
WBC: 5.8 10*3/uL (ref 3.4–10.8)

## 2019-03-05 ENCOUNTER — Telehealth: Payer: Self-pay | Admitting: Emergency Medicine

## 2019-03-05 DIAGNOSIS — G4733 Obstructive sleep apnea (adult) (pediatric): Secondary | ICD-10-CM

## 2019-03-05 NOTE — Telephone Encounter (Signed)
Called and spoke with scheduler Joe [name] with heart failure clinic about this patient's referral and how he has still not been scheduled. She will look into and give me a call back.

## 2019-03-05 NOTE — Telephone Encounter (Signed)
Updated referral for sleep specialist they should be calling patient soon for that referral as well.

## 2019-03-15 ENCOUNTER — Other Ambulatory Visit (HOSPITAL_COMMUNITY)
Admission: RE | Admit: 2019-03-15 | Discharge: 2019-03-15 | Disposition: A | Discharge status: Home / Self Care | Payer: BC Managed Care – PPO | Source: Ambulatory Visit | Attending: Cardiology | Admitting: Cardiology

## 2019-03-15 DIAGNOSIS — Z20828 Contact with and (suspected) exposure to other viral communicable diseases: Secondary | ICD-10-CM | POA: Insufficient documentation

## 2019-03-15 DIAGNOSIS — Z01812 Encounter for preprocedural laboratory examination: Secondary | ICD-10-CM | POA: Insufficient documentation

## 2019-03-15 DIAGNOSIS — I429 Cardiomyopathy, unspecified: Secondary | ICD-10-CM | POA: Insufficient documentation

## 2019-03-16 LAB — NOVEL CORONAVIRUS, NAA (HOSP ORDER, SEND-OUT TO REF LAB; TAT 18-24 HRS): SARS-CoV-2, NAA: NOT DETECTED

## 2019-03-17 ENCOUNTER — Telehealth: Payer: Self-pay | Admitting: *Deleted

## 2019-03-17 NOTE — Telephone Encounter (Signed)
Pt has been notified of negative covid test. Pt asked can his wife stay while he has his surgery. I assured the pt I will send a message to Sherri P. RN for Dr. Curt Bears to reach out in regards to pt's question. Pt thanked me for the call. The patient has been notified of the result and verbalized understanding.  All questions (if any) were answered. Julaine Hua, [place] 03/17/2019 12:02 PM

## 2019-03-17 NOTE — Telephone Encounter (Signed)
-----   Message from Will Meredith Leeds, MD sent at 03/17/2019 11:56 AM EDT ----- Stable preop labs

## 2019-03-17 NOTE — Telephone Encounter (Signed)
Informed wife may accompany pt but she will have to wait in waiting room.  Informed that she may visit him in his room, once he gets in one post procedure but that she may not stay overnight in room with him. Patient verbalized understanding and agreeable to plan.

## 2019-03-19 ENCOUNTER — Other Ambulatory Visit: Payer: Self-pay

## 2019-03-19 ENCOUNTER — Encounter (HOSPITAL_COMMUNITY): Payer: Self-pay

## 2019-03-19 ENCOUNTER — Ambulatory Visit (HOSPITAL_COMMUNITY)
Admission: RE | Disposition: A | Discharge status: Home / Self Care | Payer: BC Managed Care – PPO | Source: Home / Self Care | Attending: Cardiology

## 2019-03-19 ENCOUNTER — Ambulatory Visit (HOSPITAL_COMMUNITY)
Admission: RE | Admit: 2019-03-19 | Discharge: 2019-03-20 | Disposition: A | Discharge status: Home / Self Care | Payer: BC Managed Care – PPO | Attending: Cardiology | Admitting: Cardiology

## 2019-03-19 DIAGNOSIS — I447 Left bundle-branch block, unspecified: Secondary | ICD-10-CM | POA: Insufficient documentation

## 2019-03-19 DIAGNOSIS — E785 Hyperlipidemia, unspecified: Secondary | ICD-10-CM | POA: Diagnosis not present

## 2019-03-19 DIAGNOSIS — Z95818 Presence of other cardiac implants and grafts: Secondary | ICD-10-CM

## 2019-03-19 DIAGNOSIS — I2510 Atherosclerotic heart disease of native coronary artery without angina pectoris: Secondary | ICD-10-CM | POA: Insufficient documentation

## 2019-03-19 DIAGNOSIS — Z23 Encounter for immunization: Secondary | ICD-10-CM | POA: Diagnosis not present

## 2019-03-19 DIAGNOSIS — G4733 Obstructive sleep apnea (adult) (pediatric): Secondary | ICD-10-CM | POA: Diagnosis not present

## 2019-03-19 DIAGNOSIS — Z79899 Other long term (current) drug therapy: Secondary | ICD-10-CM | POA: Insufficient documentation

## 2019-03-19 DIAGNOSIS — E119 Type 2 diabetes mellitus without complications: Secondary | ICD-10-CM | POA: Diagnosis not present

## 2019-03-19 DIAGNOSIS — I255 Ischemic cardiomyopathy: Secondary | ICD-10-CM | POA: Insufficient documentation

## 2019-03-19 DIAGNOSIS — Z006 Encounter for examination for normal comparison and control in clinical research program: Secondary | ICD-10-CM | POA: Insufficient documentation

## 2019-03-19 DIAGNOSIS — I5022 Chronic systolic (congestive) heart failure: Secondary | ICD-10-CM | POA: Diagnosis present

## 2019-03-19 DIAGNOSIS — I110 Hypertensive heart disease with heart failure: Secondary | ICD-10-CM | POA: Insufficient documentation

## 2019-03-19 HISTORY — PX: BIV ICD INSERTION CRT-D: EP1195

## 2019-03-19 HISTORY — DX: Chronic systolic (congestive) heart failure: I50.22

## 2019-03-19 LAB — GLUCOSE, CAPILLARY
Glucose-Capillary: 171 mg/dL — ABNORMAL HIGH (ref 70–99)
Glucose-Capillary: 142 mg/dL — ABNORMAL HIGH (ref 70–99)
Glucose-Capillary: 243 mg/dL — ABNORMAL HIGH (ref 70–99)
Glucose-Capillary: 177 mg/dL — ABNORMAL HIGH (ref 70–99)

## 2019-03-19 LAB — SURGICAL PCR SCREEN
MRSA, PCR: NEGATIVE
Staphylococcus aureus: NEGATIVE

## 2019-03-19 SURGERY — BIV ICD INSERTION CRT-D

## 2019-03-19 MED ORDER — MUPIROCIN 2 % EX OINT
TOPICAL_OINTMENT | CUTANEOUS | Status: AC
  Administered 2019-03-19: 1 via TOPICAL
  Filled 2019-03-19: qty 22

## 2019-03-19 MED ORDER — SACUBITRIL-VALSARTAN 97-103 MG PO TABS
1.0000 | ORAL_TABLET | Freq: Two times a day (BID) | ORAL | Status: DC
  Administered 2019-03-19 – 2019-03-20 (×2): 1 via ORAL
  Filled 2019-03-19 (×2): qty 1

## 2019-03-19 MED ORDER — ONDANSETRON HCL 4 MG/2ML IJ SOLN: 4.0000 mg | Freq: Four times a day (QID) | INTRAMUSCULAR | Status: DC | PRN

## 2019-03-19 MED ORDER — HEPARIN (PORCINE) IN NACL 1000-0.9 UT/500ML-% IV SOLN
INTRAVENOUS | Status: AC
  Filled 2019-03-19: qty 500

## 2019-03-19 MED ORDER — POTASSIUM CHLORIDE CRYS ER 20 MEQ PO TBCR
20.0000 meq | EXTENDED_RELEASE_TABLET | Freq: Every day | ORAL | Status: DC
  Administered 2019-03-19 – 2019-03-20 (×2): 20 meq via ORAL
  Filled 2019-03-19 (×2): qty 1

## 2019-03-19 MED ORDER — CLOPIDOGREL BISULFATE 75 MG PO TABS
75.0000 mg | ORAL_TABLET | Freq: Every day | ORAL | Status: DC
  Administered 2019-03-19: 75 mg via ORAL
  Filled 2019-03-19: qty 1

## 2019-03-19 MED ORDER — MUPIROCIN 2 % EX OINT
1.0000 "application " | TOPICAL_OINTMENT | Freq: Once | CUTANEOUS | Status: AC
  Administered 2019-03-19: 10:00:00 1 via TOPICAL

## 2019-03-19 MED ORDER — CEFAZOLIN SODIUM-DEXTROSE 1-4 GM/50ML-% IV SOLN
1.0000 g | Freq: Four times a day (QID) | INTRAVENOUS | Status: AC
  Administered 2019-03-19 – 2019-03-20 (×3): 1 g via INTRAVENOUS
  Filled 2019-03-19 (×3): qty 50

## 2019-03-19 MED ORDER — SODIUM CHLORIDE 0.9 % IV SOLN
INTRAVENOUS | Status: AC
  Filled 2019-03-19: qty 2

## 2019-03-19 MED ORDER — MIDAZOLAM HCL 5 MG/5ML IJ SOLN
INTRAMUSCULAR | Status: DC | PRN
  Administered 2019-03-19 (×3): 1 mg via INTRAVENOUS

## 2019-03-19 MED ORDER — SODIUM CHLORIDE 0.9 % IV SOLN
INTRAVENOUS | Status: DC
  Administered 2019-03-19: 10:00:00 via INTRAVENOUS

## 2019-03-19 MED ORDER — ASPIRIN EC 81 MG PO TBEC
81.0000 mg | DELAYED_RELEASE_TABLET | Freq: Every day | ORAL | Status: DC
  Administered 2019-03-19 – 2019-03-20 (×2): 81 mg via ORAL
  Filled 2019-03-19 (×2): qty 1

## 2019-03-19 MED ORDER — PNEUMOCOCCAL VAC POLYVALENT 25 MCG/0.5ML IJ INJ
0.5000 mL | INJECTION | INTRAMUSCULAR | Status: AC
  Administered 2019-03-20: 0.5 mL via INTRAMUSCULAR
  Filled 2019-03-19: qty 0.5

## 2019-03-19 MED ORDER — MIDAZOLAM HCL 5 MG/5ML IJ SOLN
INTRAMUSCULAR | Status: AC
  Filled 2019-03-19: qty 5

## 2019-03-19 MED ORDER — CHLORHEXIDINE GLUCONATE 4 % EX LIQD
60.0000 mL | Freq: Once | CUTANEOUS | Status: DC
  Filled 2019-03-19: qty 60

## 2019-03-19 MED ORDER — SODIUM CHLORIDE 0.9 % IV SOLN
80.0000 mg | INTRAVENOUS | Status: AC
  Administered 2019-03-19: 80 mg

## 2019-03-19 MED ORDER — ONDANSETRON 4 MG PO TBDP
4.0000 mg | ORAL_TABLET | ORAL | Status: DC | PRN
  Filled 2019-03-19: qty 1

## 2019-03-19 MED ORDER — PANTOPRAZOLE SODIUM 40 MG PO TBEC
40.0000 mg | DELAYED_RELEASE_TABLET | Freq: Every day | ORAL | Status: DC
  Administered 2019-03-19 – 2019-03-20 (×2): 40 mg via ORAL
  Filled 2019-03-19 (×2): qty 1

## 2019-03-19 MED ORDER — IBUPROFEN 800 MG PO TABS
800.0000 mg | ORAL_TABLET | Freq: Three times a day (TID) | ORAL | Status: DC | PRN
  Administered 2019-03-19: 800 mg via ORAL
  Filled 2019-03-19: qty 1

## 2019-03-19 MED ORDER — ROSUVASTATIN CALCIUM 20 MG PO TABS
40.0000 mg | ORAL_TABLET | Freq: Every evening | ORAL | Status: DC
  Administered 2019-03-19: 40 mg via ORAL
  Filled 2019-03-19: qty 2

## 2019-03-19 MED ORDER — POTASSIUM CHLORIDE ER 20 MEQ PO TBCR: 20.0000 meq | EXTENDED_RELEASE_TABLET | Freq: Every day | ORAL | Status: DC

## 2019-03-19 MED ORDER — RANOLAZINE ER 500 MG PO TB12
500.0000 mg | ORAL_TABLET | Freq: Two times a day (BID) | ORAL | Status: DC
  Administered 2019-03-19 – 2019-03-20 (×2): 500 mg via ORAL
  Filled 2019-03-19 (×2): qty 1

## 2019-03-19 MED ORDER — SPIRONOLACTONE 12.5 MG HALF TABLET
12.5000 mg | ORAL_TABLET | Freq: Every day | ORAL | Status: DC
  Administered 2019-03-19 – 2019-03-20 (×2): 12.5 mg via ORAL
  Filled 2019-03-19 (×2): qty 1

## 2019-03-19 MED ORDER — METFORMIN HCL ER 500 MG PO TB24
1000.0000 mg | ORAL_TABLET | Freq: Every day | ORAL | Status: DC
  Administered 2019-03-19: 1000 mg via ORAL
  Filled 2019-03-19: qty 2

## 2019-03-19 MED ORDER — DEXTROSE 5 % IV SOLN
3.0000 g | INTRAVENOUS | Status: AC
  Administered 2019-03-19: 3 g via INTRAVENOUS
  Filled 2019-03-19: qty 3000

## 2019-03-19 MED ORDER — FUROSEMIDE 80 MG PO TABS
80.0000 mg | ORAL_TABLET | Freq: Every day | ORAL | Status: DC
  Administered 2019-03-19 – 2019-03-20 (×2): 80 mg via ORAL
  Filled 2019-03-19 (×2): qty 1

## 2019-03-19 MED ORDER — FENTANYL CITRATE (PF) 100 MCG/2ML IJ SOLN
INTRAMUSCULAR | Status: DC | PRN
  Administered 2019-03-19 (×3): 25 ug via INTRAVENOUS

## 2019-03-19 MED ORDER — ALLOPURINOL 100 MG PO TABS: 100.0000 mg | ORAL_TABLET | Freq: Every day | ORAL | Status: DC | PRN

## 2019-03-19 MED ORDER — LIDOCAINE HCL (PF) 1 % IJ SOLN
INTRAMUSCULAR | Status: DC | PRN
  Administered 2019-03-19: 80 mL

## 2019-03-19 MED ORDER — CARVEDILOL 12.5 MG PO TABS
12.5000 mg | ORAL_TABLET | Freq: Two times a day (BID) | ORAL | Status: DC
  Administered 2019-03-19 – 2019-03-20 (×2): 12.5 mg via ORAL
  Filled 2019-03-19 (×2): qty 1

## 2019-03-19 MED ORDER — NITROGLYCERIN 0.4 MG SL SUBL: 0.4000 mg | SUBLINGUAL_TABLET | SUBLINGUAL | Status: DC | PRN

## 2019-03-19 MED ORDER — IOHEXOL 350 MG/ML SOLN
INTRAVENOUS | Status: DC | PRN
  Administered 2019-03-19: 9 mL
  Administered 2019-03-19: 13:00:00 20 mL

## 2019-03-19 MED ORDER — ACETAMINOPHEN 500 MG PO TABS
1000.0000 mg | ORAL_TABLET | Freq: Four times a day (QID) | ORAL | Status: DC | PRN
  Administered 2019-03-19: 1000 mg via ORAL
  Filled 2019-03-19: qty 2

## 2019-03-19 MED ORDER — POLYVINYL ALCOHOL 1.4 % OP SOLN
Freq: Two times a day (BID) | OPHTHALMIC | Status: DC | PRN
  Filled 2019-03-19: qty 15

## 2019-03-19 MED ORDER — LIDOCAINE HCL 1 % IJ SOLN
INTRAMUSCULAR | Status: AC
  Filled 2019-03-19: qty 20

## 2019-03-19 MED ORDER — FENTANYL CITRATE (PF) 100 MCG/2ML IJ SOLN
INTRAMUSCULAR | Status: AC
  Filled 2019-03-19: qty 2

## 2019-03-19 MED ORDER — METFORMIN HCL ER 500 MG PO TB24
500.0000 mg | ORAL_TABLET | Freq: Every day | ORAL | Status: DC
  Administered 2019-03-20: 500 mg via ORAL
  Filled 2019-03-19: qty 1

## 2019-03-19 MED ORDER — LIDOCAINE HCL 1 % IJ SOLN
INTRAMUSCULAR | Status: AC
  Filled 2019-03-19: qty 60

## 2019-03-19 MED ORDER — YOU HAVE A PACEMAKER BOOK
Freq: Once | Status: AC
  Administered 2019-03-19: 22:00:00
  Filled 2019-03-19: qty 1

## 2019-03-19 MED ORDER — HEPARIN (PORCINE) IN NACL 1000-0.9 UT/500ML-% IV SOLN
INTRAVENOUS | Status: DC | PRN
  Administered 2019-03-19: 500 mL

## 2019-03-19 SURGICAL SUPPLY — 21 items
ASSURA CRTD CD3369-40Q (ICD Generator) ×2 IMPLANT
ATTRACTOMAT 16X20 MAGNETIC DRP (DRAPES) ×2 IMPLANT
BALLN COR SINUS VENO 6FR 80 (BALLOONS) ×2
BALLOON COR SINUS VENO 6FR 80 (BALLOONS) ×1 IMPLANT
CABLE SURGICAL S-101-97-12 (CABLE) ×2 IMPLANT
CATH CPS DIRECT 135 DS2C020 (CATHETERS) ×2 IMPLANT
CATH CPS QUART CN DS2N029-65 (CATHETERS) ×2 IMPLANT
CPS IMPLANT KIT 410190 (MISCELLANEOUS) ×2 IMPLANT
DEFIB ASSURA MULTI-CHMBR CRT-D (ICD Generator) ×1 IMPLANT
HOVERMATT SINGLE USE (MISCELLANEOUS) ×2 IMPLANT
KIT ESSENTIALS PG (KITS) ×2 IMPLANT
LEAD DURATA 7122Q-65CM (Lead) ×2 IMPLANT
LEAD QUARTET 1458Q-86CM (Lead) ×2 IMPLANT
LEAD TENDRIL MRI 52CM LPA1200M (Lead) ×2 IMPLANT
PAD PRO RADIOLUCENT 2001M-C (PAD) ×2 IMPLANT
SHEATH 7FR PRELUDE SNAP 13 (SHEATH) ×2 IMPLANT
SHEATH 8FR PRELUDE SNAP 13 (SHEATH) ×2 IMPLANT
TRAY PACEMAKER INSERTION (PACKS) ×2 IMPLANT
WIRE ACUITY WHISPER EDS 4648 (WIRE) ×2 IMPLANT
WIRE HI TORQ VERSACORE-J 145CM (WIRE) ×2 IMPLANT
WIRE MAILMAN 182CM (WIRE) ×2 IMPLANT

## 2019-03-19 NOTE — H&P (Signed)
ICD Criteria  Current LVEF:25-30%. Within 12 months prior to implant: Yes   Heart failure history: Yes, Class II  Cardiomyopathy history: Yes, Ischemic Cardiomyopathy - Prior MI.  Atrial Fibrillation/Atrial Flutter: No.  Ventricular tachycardia history: No.  Cardiac arrest history: No.  History of syndromes with risk of sudden death: No.  Previous ICD: No.  Current ICD indication: Primary  PPM indication: No.  Class I or II Bradycardia indication present: No  Beta Blocker therapy for 3 or more months: Yes, prescribed.   Ace Inhibitor/ARB therapy for 3 or more months: Yes, prescribed.    I have seen Joe [name] is a 48 y.o. malepre-procedural and has been referred by [facility] for consideration of ICD implant for primary prevention of sudden death.  The patient's chart has been reviewed and they meet criteria for ICD implant.  I have had a thorough discussion with the patient reviewing options.  The patient and their family (if available) have had opportunities to ask questions and have them answered. The patient and I have decided together through the [place] [employer] to implant ICD at this time.  Risks, benefits, alternatives to ICD implantation were discussed in detail with the patient today. The patient  understands that the risks include but are not limited to bleeding, infection, pneumothorax, perforation, tamponade, vascular damage, renal failure, MI, stroke, death, inappropriate shocks, and lead dislodgement and  wishes to proceed.

## 2019-03-19 NOTE — Discharge Summary (Addendum)
ELECTROPHYSIOLOGY PROCEDURE DISCHARGE SUMMARY    Patient ID: Joe [name],  MRN: [id number], DOB/AGE: Jun 06, 1971 48 y.o.  Admit date: 03/19/2019 Discharge date: 03/20/2019  Primary Care Physician: Cyndi Bender, PA-C  Primary Cardiologist: Dr. Agustin Cree Electrophysiologist: Dr. Curt Bears  Primary Discharge Diagnosis:  1. ICM  Secondary Discharge Diagnosis:  1. CAD 2. Chronic CHF (systolic) 3. HTN 4. HLD 5. OSA w/CPAP 6. DM   No Known Allergies   Procedures This Admission:  1.  Implantation of a SJM CRT-D on 03/19/2019 by Dr Curt Bears .  The patient received a [address] Jude Model Quadra [employer] [facility] [id number] CRT-D (serial Number [id number] ) device, St. Jude model Tendril MRI [id number] (serial # [id number] ) right atrial lead and a St. Jude model Durata [id number] (serial number [id number]) right ventricular defibrillator lead, St. Jude model Quartet [phone] (serial number [id number] ) lead DFT's were deferred at time of implant.   There were no immediate post procedure complications. 2.  CXR on 03/20/2019 demonstrated no pneumothorax status post device implantation.   Brief HPI: Joe [name] is a 48 y.o. male was referred to electrophysiology in the outpatient setting for consideration of ICD implantation.  Past medical history includes above.  The patient has persistent LV dysfunction despite guideline directed therapy.  Risks, benefits, and alternatives to ICD implantation were reviewed with the patient who wished to proceed.   Hospital Course:  The patient was admitted and underwent implantation of an ICD with details as outlined above. He was monitored on telemetry overnight which demonstrated SR/VP.  Left chest was without hematoma or ecchymosis.  The device was interrogated and found to be functioning normally.  CXR was obtained and demonstrated no pneumothorax status post device implantation.  Wound care, arm mobility, and restrictions were reviewed with the patient.  The patient  feels well this morning, no CP or SOB. He was examined by Dr. Curt Bears and considered stable for discharge to home.   The patient's discharge medications include an ACE/ARB (Entresto) and beta blocker (carvedilol).   Physical Exam: Vitals:   03/19/19 2157 03/20/19 0600 03/20/19 0832 03/20/19 0837  BP:  116/60 102/61 102/61  Pulse: 98 (!) 49 93   Resp:  18    Temp:  (!) 97.5 F (36.4 C)    TempSrc:  Oral    SpO2: 93% 95%    Weight:  (!) 148.7 kg    Height:        GEN- The patient is well appearing, alert and oriented x 3 today.   HEENT: normocephalic, atraumatic; sclera clear, conjunctiva pink; hearing intact; oropharynx clear Lungs- CTA b/l, normal work of breathing.  No wheezes, rales, rhonchi Heart- RRR, no murmurs, rubs or gallops, PMI not laterally displaced GI- soft, non-tender, non-distended Extremities- no clubbing, cyanosis, or edema MS- no significant deformity or atrophy Skin- warm and dry, no rash or lesion, left chest without hematoma/ecchymosis Psych- euthymic mood, full affect Neuro- no gross defecits  Labs:   Lab Results  Component Value Date   WBC 5.8 03/03/2019   HGB 13.3 03/03/2019   HCT 40.4 03/03/2019   MCV 94 03/03/2019   PLT 222 03/03/2019   No results for input(s): NA, K, CL, CO2, BUN, CREATININE, CALCIUM, PROT, BILITOT, ALKPHOS, ALT, AST, GLUCOSE in the last 168 hours.  Invalid input(s): LABALBU  Discharge Medications:  Allergies as of 03/20/2019   No Known Allergies     Medication List    STOP taking these medications   ticagrelor 90 MG Tabs tablet  Commonly known as: Brilinta     TAKE these medications   acetaminophen 500 MG tablet Commonly known as: TYLENOL Take 1,000 mg by mouth every 6 (six) hours as needed for moderate pain or headache.   allopurinol 100 MG tablet Commonly known as: ZYLOPRIM Take 100 mg by mouth daily as needed (for gout).   aspirin EC 81 MG tablet Take 1 tablet (81 mg total) by mouth daily.   BLINK TEARS OP  Place 2 drops into both eyes 2 (two) times daily as needed (for dry eyes).   carvedilol 12.5 MG tablet Commonly known as: COREG TAKE 1 TABLET BY MOUTH TWICE A DAY   clopidogrel 75 MG tablet Commonly known as: PLAVIX Take 1 tablet (75 mg total) by mouth daily. What changed: when to take this   Entresto 97-103 MG Generic drug: sacubitril-valsartan Take 1 tablet by mouth 2 (two) times daily.   furosemide 40 MG tablet Commonly known as: LASIX Take 2 tablets (80 mg total) by mouth daily.   ibuprofen 200 MG tablet Commonly known as: ADVIL Take 800 mg by mouth every 8 (eight) hours as needed for headache or moderate pain.   metFORMIN 500 MG 24 hr tablet Commonly known as: GLUCOPHAGE-XR Take 1-2 tablets (500-1,000 mg total) by mouth See admin instructions. Take 500 mg by mouth in the morning and take 1000 mg by mouth in the evening   nitroGLYCERIN 0.4 MG SL tablet Commonly known as: NITROSTAT Place 1 tablet (0.4 mg total) under the tongue every 5 (five) minutes as needed for chest pain.   omeprazole 40 MG capsule Commonly known as: PRILOSEC Take 1 capsule (40 mg total) by mouth daily.   ondansetron 4 MG disintegrating tablet Commonly known as: Zofran ODT 4mg  ODT q4 hours prn nausea/vomit What changed:   how much to take  how to take this  when to take this  reasons to take this  additional instructions   Potassium Chloride ER 20 MEQ Tbcr TAKE 1 TABLET BY MOUTH EVERY DAY What changed: how much to take   ranolazine 500 MG 12 hr tablet Commonly known as: RANEXA Take 1 tablet (500 mg total) by mouth 2 (two) times daily.   rosuvastatin 40 MG tablet Commonly known as: CRESTOR Take 1 tablet (40 mg total) by mouth every evening.   spironolactone 25 MG tablet Commonly known as: ALDACTONE TAKE 0.5 TABLETS BY MOUTH EVERY DAY What changed: See the new instructions.       Disposition:  Home  Discharge Instructions    Diet - low sodium heart healthy   Complete by:  As directed    Increase activity slowly   Complete by: As directed      Follow-up Information    [facility].   Specialty: Cardiology Why: 04/01/2019 @ 9:30AM, wound check visit Contact information: [address] [place]       Constance Haw, MD Follow up.   Specialty: Cardiology Why: you Dairon Procter be called by Dr. Macky Lower scheduler to make a 3 month follow up visit Contact information: [place] [address] [phone]           Duration of Discharge Encounter: Greater than 30 minutes including physician time.  SignedTommye Standard, PA-C 03/20/2019 9:17 AM  I have seen and examined this patient with Tommye Standard.  Agree with above, note added to reflect my findings.  On exam, RRR, no  murmurs, lungs clear.  She is now status post St. Jude CRT-D for chronic systolic heart failure.  Device functioning appropriately.  Chest x-ray and interrogation without issue.  Plan for discharge today with follow-up in device clinic.  Caelen Higinbotham M. Bradely Rudin MD 03/20/2019 9:23 AM

## 2019-03-20 ENCOUNTER — Ambulatory Visit (HOSPITAL_COMMUNITY): Payer: BC Managed Care – PPO

## 2019-03-20 DIAGNOSIS — I110 Hypertensive heart disease with heart failure: Secondary | ICD-10-CM | POA: Diagnosis not present

## 2019-03-20 DIAGNOSIS — I5022 Chronic systolic (congestive) heart failure: Secondary | ICD-10-CM | POA: Diagnosis not present

## 2019-03-20 DIAGNOSIS — Z23 Encounter for immunization: Secondary | ICD-10-CM | POA: Diagnosis not present

## 2019-03-20 DIAGNOSIS — Z006 Encounter for examination for normal comparison and control in clinical research program: Secondary | ICD-10-CM | POA: Diagnosis not present

## 2019-03-20 DIAGNOSIS — I255 Ischemic cardiomyopathy: Secondary | ICD-10-CM

## 2019-03-20 LAB — GLUCOSE, CAPILLARY: Glucose-Capillary: 202 mg/dL — ABNORMAL HIGH (ref 70–99)

## 2019-03-20 MED FILL — Lidocaine HCl Local Inj 1%: INTRAMUSCULAR | Qty: 80 | Status: AC

## 2019-03-20 NOTE — Discharge Instructions (Signed)
° ° °  Supplemental Discharge Instructions for  Pacemaker/Defibrillator Patients  Activity No heavy lifting or vigorous activity with your left/right arm for 6 to 8 weeks.  Do not raise your left/right arm above your head for one week.  Gradually raise your affected arm as drawn below.             03/23/2019                 03/24/2019                03/25/2019               03/26/2019 __  NO DRIVING for until cleared to at your wound check visit Marks the wound area clean and dry.  Do not get this area wet, no showers until cleared to at your wound check visit. - The tape/steri-strips on your wound will fall off; do not pull them off.  No bandage is needed on the site.  DO  NOT apply any creams, oils, or ointments to the wound area. - If you notice any drainage or discharge from the wound, any swelling or bruising at the site, or you develop a fever > 101? F after you are discharged home, call the office at once.  Special Instructions - You are still able to use cellular telephones; use the ear opposite the side where you have your pacemaker/defibrillator.  Avoid carrying your cellular phone near your device. - When traveling through airports, show security personnel your identification card to avoid being screened in the metal detectors.  Ask the security personnel to use the hand wand. - Avoid arc welding equipment, MRI testing (magnetic resonance imaging), TENS units (transcutaneous nerve stimulators).  Call the office for questions about other devices. - Avoid electrical appliances that are in poor condition or are not properly grounded. - Microwave ovens are safe to be near or to operate.  Additional information for defibrillator patients should your device go off: - If your device goes off ONCE and you feel fine afterward, notify the device clinic nurses. - If your device goes off ONCE and you do not feel well afterward, call 911. - If your device goes off TWICE, call  911. - If your device goes off THREE times in one day, call 911.  DO NOT DRIVE YOURSELF OR A FAMILY MEMBER WITH A DEFIBRILLATOR TO THE HOSPITAL--CALL 911.

## 2019-03-26 ENCOUNTER — Ambulatory Visit (HOSPITAL_COMMUNITY)
Admission: RE | Admit: 2019-03-26 | Discharge: 2019-03-26 | Disposition: A | Discharge status: Home / Self Care | Payer: BC Managed Care – PPO | Source: Ambulatory Visit | Attending: Internal Medicine | Admitting: Internal Medicine

## 2019-03-26 ENCOUNTER — Other Ambulatory Visit: Payer: Self-pay

## 2019-03-26 ENCOUNTER — Encounter (HOSPITAL_COMMUNITY): Payer: Self-pay | Admitting: Internal Medicine

## 2019-03-26 VITALS — BP 97/64 | HR 93 | Wt 384.2 lb

## 2019-03-26 DIAGNOSIS — Z833 Family history of diabetes mellitus: Secondary | ICD-10-CM | POA: Insufficient documentation

## 2019-03-26 DIAGNOSIS — Z95810 Presence of automatic (implantable) cardiac defibrillator: Secondary | ICD-10-CM | POA: Diagnosis not present

## 2019-03-26 DIAGNOSIS — I2510 Atherosclerotic heart disease of native coronary artery without angina pectoris: Secondary | ICD-10-CM | POA: Diagnosis not present

## 2019-03-26 DIAGNOSIS — E119 Type 2 diabetes mellitus without complications: Secondary | ICD-10-CM | POA: Diagnosis not present

## 2019-03-26 DIAGNOSIS — E785 Hyperlipidemia, unspecified: Secondary | ICD-10-CM | POA: Insufficient documentation

## 2019-03-26 DIAGNOSIS — G4733 Obstructive sleep apnea (adult) (pediatric): Secondary | ICD-10-CM | POA: Diagnosis not present

## 2019-03-26 DIAGNOSIS — K219 Gastro-esophageal reflux disease without esophagitis: Secondary | ICD-10-CM | POA: Diagnosis not present

## 2019-03-26 DIAGNOSIS — R0683 Snoring: Secondary | ICD-10-CM | POA: Diagnosis not present

## 2019-03-26 DIAGNOSIS — Z955 Presence of coronary angioplasty implant and graft: Secondary | ICD-10-CM | POA: Diagnosis not present

## 2019-03-26 DIAGNOSIS — Z7902 Long term (current) use of antithrombotics/antiplatelets: Secondary | ICD-10-CM | POA: Insufficient documentation

## 2019-03-26 DIAGNOSIS — Z79899 Other long term (current) drug therapy: Secondary | ICD-10-CM | POA: Insufficient documentation

## 2019-03-26 DIAGNOSIS — Z7984 Long term (current) use of oral hypoglycemic drugs: Secondary | ICD-10-CM | POA: Diagnosis not present

## 2019-03-26 DIAGNOSIS — Z7982 Long term (current) use of aspirin: Secondary | ICD-10-CM | POA: Insufficient documentation

## 2019-03-26 DIAGNOSIS — E7800 Pure hypercholesterolemia, unspecified: Secondary | ICD-10-CM | POA: Diagnosis not present

## 2019-03-26 DIAGNOSIS — I5022 Chronic systolic (congestive) heart failure: Secondary | ICD-10-CM

## 2019-03-26 DIAGNOSIS — I4510 Unspecified right bundle-branch block: Secondary | ICD-10-CM | POA: Diagnosis not present

## 2019-03-26 DIAGNOSIS — I110 Hypertensive heart disease with heart failure: Secondary | ICD-10-CM | POA: Diagnosis not present

## 2019-03-26 DIAGNOSIS — Z9861 Coronary angioplasty status: Secondary | ICD-10-CM

## 2019-03-26 LAB — BASIC METABOLIC PANEL
Anion gap: 10 (ref 5–15)
BUN: 14 mg/dL (ref 6–20)
CO2: 25 mmol/L (ref 22–32)
Calcium: 8.9 mg/dL (ref 8.9–10.3)
Chloride: 100 mmol/L (ref 98–111)
Creatinine, Ser: 1.22 mg/dL (ref 0.61–1.24)
GFR calc Af Amer: >60 mL/min (ref >60)
GFR calc non Af Amer: >60 mL/min (ref >60)
Glucose, Bld: 244 mg/dL — ABNORMAL HIGH (ref 70–99)
Potassium: 4.4 mmol/L (ref 3.5–5.1)
Sodium: 135 mmol/L (ref 135–145)

## 2019-03-26 LAB — BRAIN NATRIURETIC PEPTIDE: B Natriuretic Peptide: 52 pg/mL (ref 0.0–100.0)

## 2019-03-26 MED ORDER — FARXIGA 10 MG PO TABS: 10.0000 mg | ORAL_TABLET | Freq: Every day | ORAL | 6 refills | Status: DC

## 2019-03-26 MED ORDER — DIGOXIN 125 MCG PO TABS: 0.1250 mg | ORAL_TABLET | Freq: Every day | ORAL | 3 refills | Status: DC

## 2019-03-26 NOTE — Progress Notes (Signed)
Patient Name: Joe [name]         DOB: 05/19/1971      Height: 65'3    [id number]  Office Name: Advanced [facility] Failure at [facility]         Referring Provider:Daniel Bensimhon  Today's Date: 03/26/19  Date:   STOP BANG RISK ASSESSMENT S (snore) Have you been told that you snore?     YES   T (tired) Are you often tired, fatigued, or sleepy during the day?   YES  O (obstruction) Do you stop breathing, choke, or gasp during sleep? YES   P (pressure) Do you have or are you being treated for high blood pressure? NO   B (BMI) Is your body index greater than 35 kg/m? YES   A (age) Are you 65 years old or older? NO   N (neck) Do you have a neck circumference greater than 16 inches?   NA   G (gender) Are you a male? YES   TOTAL STOP/BANG "YES" ANSWERS 5                                                                       For Office Use Only              Procedure Order Form    YES to 3+ Stop Bang questions OR two clinical symptoms - patient qualifies for WatchPAT (CPT 95800)     Submit: This Form + Patient Face Sheet + Clinical Note via CloudPAT or Fax: [phone]         Clinical Notes: Will consult Sleep Specialist and refer for management of therapy due to patient increased risk of Sleep Apnea. Ordering a sleep study due to the following two clinical symptoms: Excessive daytime sleepiness G47.10 / Gastroesophageal reflux K21.9 / Nocturia R35.1 / Morning Headaches G44.221 / Difficulty concentrating R41.840 / Memory problems or poor judgment G31.84 / Personality changes or irritability R45.4 / Loud snoring R06.83 / Depression F32.9 / Unrefreshed by sleep G47.8 / Impotence N52.9 / History of high blood pressure R03.0 / Insomnia G47.00    I understand that I am proceeding with a home sleep apnea test as ordered by my treating physician. I understand that untreated sleep apnea is a serious cardiovascular risk factor and it is my responsibility to perform the test and seek management  for sleep apnea. I will be contacted with the results and be managed for sleep apnea by a local sleep physician. I will be receiving equipment and further instructions from [facility]. I shall promptly ship back the equipment via the included mailing label. I understand my insurance will be billed for the test and as the patient I am responsible for any insurance related out-of-pocket costs incurred. I have been provided with written instructions and can call for additional video or telephonic instruction, with 24-hour availability of qualified personnel to answer any questions: Patient Help Desk [phone].  Patient Signature ______________________________________________________   Date______________________ Patient Telemedicine Verbal Consent

## 2019-03-26 NOTE — Progress Notes (Signed)
ADVANCED HF CLINIC CONSULT NOTE  Referring Physician: Agustin Cree Primary Care: Cyndi Bender Primary Cardiologist: Agustin Cree   HPI:  Joe [name] is a 48 y/o male with h/o morbid obesity, DM2, HTN, HL, OSA, CAD and systolic HF (EF 0000000) referred by Dr. Agustin Cree for further management of his HF   Has h/o RBBB but never had any other heart problems until 3/19. Saw Dr. Drue Dun in 3/19 due to CP and SOB. Cath EF 25-35% Severe diabetic CAD with LAD disease (m90 and m75),  RCA d50, LCA m50.   Echo 7/20 EF 20-25% moderate RV dysfunction  Repeat cath in 8/10 with patent LAD stents. LCX p80 m75 -> 2 more stents.Eventually end up having another cardiac catheterization in January 2020 which showed 40% distal LAD, stents were patent, mid RCA got 25% lesion right PDA had 50% stenosis the circumflex artery were open. No intervention has been required.   Myoview 12/19 EF 30% Apical and infero-apical scar  Had St. Jude ICD placed last 03/19/19 with Dr. Curt Bears.  Has been on Entresto (recently increased to full dose), spiro and carvedilol. Mostly with NYHA III symptoms but recently feeling better lately. Still working at [employer]. Has OSA but not wearing CPAP. (Has been referred to sleep doctor but I don't see appt in Epic). Denies edema, orthopnea or PND. Says he thinks his DM doing ok but doesn't follow sugars regularly     Review of Systems: [y] = yes, [ ]  = no   General: Weight gain [ ] ; Weight loss [ ] ; Anorexia [ ] ; Fatigue Blue.Reese ]; Fever [ ] ; Chills [ ] ; Weakness [ ]   Cardiac: Chest pain/pressure [ ] ; Resting SOB [ ] ; Exertional SOB [ ] ; Orthopnea [ ] ; Pedal Edema [ ] ; Palpitations [ ] ; Syncope [ ] ; Presyncope [ ] ; Paroxysmal nocturnal dyspnea[ ]   Pulmonary: Cough [ ] ; Wheezing[ ] ; Hemoptysis[ ] ; Sputum [ ] ; Snoring [ ]   GI: Vomiting[ ] ; Dysphagia[ ] ; Melena[ ] ; Hematochezia [ ] ; Heartburn[ ] ; Abdominal pain [ ] ; Constipation [ ] ; Diarrhea [ ] ; BRBPR [ ]   GU:  Hematuria[ ] ; Dysuria [ ] ; Nocturia[ ]   Vascular: Pain in legs with walking [ ] ; Pain in feet with lying flat [ ] ; Non-healing sores [ ] ; Stroke [ ] ; TIA [ ] ; Slurred speech [ ] ;  Neuro: Headaches[ ] ; Vertigo[ ] ; Seizures[ ] ; Paresthesias[ ] ;Blurred vision [ ] ; Diplopia [ ] ; Vision changes [ ]   Ortho/Skin: Arthritis Blue.Reese ]; Joint pain [ y]; Muscle pain [ ] ; Joint swelling [ ] ; Back Pain [ ] ; Rash [ ]   Psych: Depression[ ] ; Anxiety[ ]   Heme: Bleeding problems [ ] ; Clotting disorders [ ] ; Anemia [ ]   Endocrine: Diabetes Blue.Reese ]; Thyroid dysfunction[ ]    Past Medical History:  Diagnosis Date  . Abnormal EKG   . Body mass index 45.0-49.9, adult ([place])   . CHF (congestive heart failure) ([place])   . Complication of anesthesia 1995   "had hard time waking up; breathing w/vasectomy"  . Coronary artery disease   . Erectile dysfunction   . Essential hypertension, benign   . Fatigue   . GERD (gastroesophageal reflux disease)   . High cholesterol    "just started tx yesterday" (09/06/2017)  . History of gout    "RX prn" (09/06/2017)  . Muscular chest pain   . Obesity   . OSA on CPAP   . Plantar fasciitis   . Right bundle branch block   . Testosterone deficiency   . Type  II diabetes mellitus ([place])    "started tx 08/28/2017"    Current Outpatient Medications  Medication Sig Dispense Refill  . acetaminophen (TYLENOL) 500 MG tablet Take 1,000 mg by mouth every 6 (six) hours as needed for moderate pain or headache.    . allopurinol (ZYLOPRIM) 100 MG tablet Take 100 mg by mouth daily as needed (for gout).     Marland Kitchen aspirin EC 81 MG tablet Take 1 tablet (81 mg total) by mouth daily. 90 tablet 3  . carvedilol (COREG) 12.5 MG tablet TAKE 1 TABLET BY MOUTH TWICE A DAY (Patient taking differently: Take 12.5 mg by mouth 2 (two) times daily. ) 180 tablet 2  . clopidogrel (PLAVIX) 75 MG tablet Take 1 tablet (75 mg total) by mouth daily. (Patient taking differently: Take 75 mg by mouth at bedtime. ) 90 tablet 1  .  furosemide (LASIX) 40 MG tablet Take 2 tablets (80 mg total) by mouth daily. 180 tablet 1  . ibuprofen (ADVIL) 200 MG tablet Take 800 mg by mouth every 8 (eight) hours as needed for headache or moderate pain.    . indomethacin (INDOCIN) 50 MG capsule TAKE 1 CAPSULE 3 TIMES A DAY AS NEEDED FOR GOUT FLARE SYMPTOMS    . metFORMIN (GLUCOPHAGE-XR) 500 MG 24 hr tablet Take 1-2 tablets (500-1,000 mg total) by mouth See admin instructions. Take 500 mg by mouth in the morning and take 1000 mg by mouth in the evening  6  . nitroGLYCERIN (NITROSTAT) 0.4 MG SL tablet Place 1 tablet (0.4 mg total) under the tongue every 5 (five) minutes as needed for chest pain. 25 tablet 6  . omeprazole (PRILOSEC) 40 MG capsule Take 1 capsule (40 mg total) by mouth daily. 90 capsule 1  . ondansetron (ZOFRAN ODT) 4 MG disintegrating tablet 4mg  ODT q4 hours prn nausea/vomit (Patient taking differently: Take 4 mg by mouth every 4 (four) hours as needed for nausea or vomiting. ) 20 tablet 0  . Polyethylene Glycol 400 (BLINK TEARS OP) Place 2 drops into both eyes 2 (two) times daily as needed (for dry eyes).    . Potassium Chloride ER 20 MEQ TBCR TAKE 1 TABLET BY MOUTH EVERY DAY (Patient taking differently: Take 20 mEq by mouth daily. ) 90 tablet 1  . ranolazine (RANEXA) 500 MG 12 hr tablet Take 1 tablet (500 mg total) by mouth 2 (two) times daily. 60 tablet 6  . rosuvastatin (CRESTOR) 40 MG tablet Take 1 tablet (40 mg total) by mouth every evening. 30 tablet 6  . sacubitril-valsartan (ENTRESTO) 97-103 MG Take 1 tablet by mouth 2 (two) times daily. 60 tablet 2  . spironolactone (ALDACTONE) 25 MG tablet TAKE 0.5 TABLETS BY MOUTH EVERY DAY (Patient taking differently: Take 12.5 mg by mouth daily. ) 45 tablet 4   No current facility-administered medications for this encounter.     No Known Allergies    Social History   Socioeconomic History  . Marital status: Married    Spouse name: Not on file  . Number of children: Not on  file  . Years of education: Not on file  . Highest education level: Not on file  Occupational History  . Not on file  Social Needs  . Financial resource strain: Not on file  . Food insecurity    Worry: Not on file    Inability: Not on file  . Transportation needs    Medical: Not on file    Non-medical: Not on file  Tobacco Use  .  Smoking status: Never Smoker  . Smokeless tobacco: Never Used  Substance and Sexual Activity  . Alcohol use: No    Frequency: Never  . Drug use: Never  . Sexual activity: Not on file  Lifestyle  . Physical activity    Days per week: Not on file    Minutes per session: Not on file  . Stress: Not on file  Relationships  . Social Herbalist on phone: Not on file    Gets together: Not on file    Attends religious service: Not on file    Active member of club or organization: Not on file    Attends meetings of clubs or organizations: Not on file    Relationship status: Not on file  . Intimate partner violence    Fear of current or ex partner: Not on file    Emotionally abused: Not on file    Physically abused: Not on file    Forced sexual activity: Not on file  Other Topics Concern  . Not on file  Social History Narrative  . Not on file      Family History  Problem Relation Age of Onset  . Lymphoma Father   . Diabetes Mellitus II Brother   . Diabetes Mellitus II Paternal Grandfather   . Lung cancer Mother   . Brain cancer Mother     Vitals:   03/26/19 1131  BP: 97/64  Pulse: 93  SpO2: 95%  Weight: (!) 174.3 kg (384 lb 3.2 oz)    PHYSICAL EXAM: General:  Well appearing. No respiratory difficulty HEENT: normal Neck: supple. no JVD. Carotids 2+ bilat; no bruits. No lymphadenopathy or thryomegaly appreciated. Cor: PMI nondisplaced. Regular rate & rhythm. No rubs, gallops or murmurs. Lungs: clear Abdomen: obese soft, nontender, nondistended. No hepatosplenomegaly. No bruits or masses. Good bowel sounds. Extremities: no  cyanosis, clubbing, rash, edema Neuro: alert & oriented x 3, cranial nerves grossly intact. moves all 4 extremities w/o difficulty. Affect pleasant.  ASSESSMENT & PLAN:  1. Chronic systolic HF - he has significant CAD but EF reduction seems a bit out of proportion to CAD. ? mixed CM due to OSA or DM - Echo EF 20-25%  - s/p STJ ICD - NYHA II-III - Volume status perhaps mildly elevated  - On excellent meds - Continue Entresto 97/103 bid - Continue Spiro 12.5 daily - Continue carvedilol 12.5 bid - Continue lasix 80 daily  - Add Farxiga 10.  - Will order home sleep study - Discussed possible eventual need for advanced therapies  2. CAD - s/p multiple stents to LAD and LCX - last cath stable CAD - no s/s ischemia - Continue DAPT and statin and Ranexa  3. DM2 - add Farxiga  4. OSA - not using CPAP - repeat sleep study  5. Severe obesity - has struggled with weight loss - discussed [employer]. Attempt to lose 1 pound per week.   Glori Bickers, MD  3:09 PM

## 2019-03-26 NOTE — Patient Instructions (Signed)
START Joe [name] 10mg  (1 tab) daily  START Digoxin 0.125mg  (1 tab) daily  Labs today We will only contact you if something comes back abnormal or we need to make some changes. Otherwise no news is good news!  You have been ordered for a sleep study. You will be contacted to arrange shipment of this equipment.  You have been referred to General Cardiology Dr Radford Pax for sleep evaluation.  Your physician recommends that you schedule a follow-up appointment in: 3 months with Dr Joe [name]. You will get a call to schedule this appointment.   At the [facility], you and your health needs are our priority. As part of our continuing mission to provide you with exceptional heart care, we have created designated Provider Care Teams. These Care Teams include your primary Cardiologist (physician) and Advanced Practice Providers (APPs- Physician Assistants and Nurse Practitioners) who all work together to provide you with the care you need, when you need it.   You may see any of the following providers on your designated Care Team at your next follow up: Joe [name] Kitchen Dr Joe [name] . Dr Joe [name] . Joe Grinder, NP   Please be sure to bring in all your medications bottles to every appointment.

## 2019-03-28 ENCOUNTER — Telehealth (HOSPITAL_COMMUNITY): Payer: Self-pay

## 2019-03-28 NOTE — Telephone Encounter (Signed)
Order, OV note, stop bang and demographics all faxed to [facility] at [phone] via epic  

## 2019-04-01 ENCOUNTER — Ambulatory Visit (INDEPENDENT_AMBULATORY_CARE_PROVIDER_SITE_OTHER): Payer: BC Managed Care – PPO | Admitting: Student

## 2019-04-01 ENCOUNTER — Other Ambulatory Visit: Payer: Self-pay

## 2019-04-01 DIAGNOSIS — G4733 Obstructive sleep apnea (adult) (pediatric): Secondary | ICD-10-CM | POA: Diagnosis not present

## 2019-04-01 DIAGNOSIS — I2510 Atherosclerotic heart disease of native coronary artery without angina pectoris: Secondary | ICD-10-CM

## 2019-04-01 DIAGNOSIS — Z9861 Coronary angioplasty status: Secondary | ICD-10-CM

## 2019-04-01 DIAGNOSIS — I5022 Chronic systolic (congestive) heart failure: Secondary | ICD-10-CM

## 2019-04-01 LAB — CUP PACEART INCLINIC DEVICE CHECK
Battery Remaining Longevity: 52 mo
Brady Statistic RA Percent Paced: 0.02 %
Brady Statistic RV Percent Paced: 93 %
Date Time Interrogation Session: 20201013095530
HighPow Impedance: 64.125
Implantable Lead Implant Date: 20200930
Implantable Lead Implant Date: 20200930
Implantable Lead Implant Date: 20200930
Implantable Lead Location: 753858
Implantable Lead Location: 753859
Implantable Lead Location: 753860
Implantable Pulse Generator Implant Date: 20200930
Lead Channel Impedance Value: 437.5 Ohm
Lead Channel Impedance Value: 475 Ohm
Lead Channel Impedance Value: 975 Ohm
Lead Channel Pacing Threshold Amplitude: 0.75 V
Lead Channel Pacing Threshold Amplitude: 0.75 V
Lead Channel Pacing Threshold Amplitude: 0.75 V
Lead Channel Pacing Threshold Amplitude: 0.75 V
Lead Channel Pacing Threshold Amplitude: 1 V
Lead Channel Pacing Threshold Amplitude: 1 V
Lead Channel Pacing Threshold Pulse Width: 0.5 ms
Lead Channel Pacing Threshold Pulse Width: 0.5 ms
Lead Channel Pacing Threshold Pulse Width: 0.5 ms
Lead Channel Pacing Threshold Pulse Width: 0.5 ms
Lead Channel Pacing Threshold Pulse Width: 0.5 ms
Lead Channel Pacing Threshold Pulse Width: 0.5 ms
Lead Channel Sensing Intrinsic Amplitude: 11.7 mV
Lead Channel Sensing Intrinsic Amplitude: 2.6 mV
Lead Channel Setting Pacing Amplitude: 3.5 V
Lead Channel Setting Pacing Amplitude: 3.5 V
Lead Channel Setting Pacing Amplitude: 3.5 V
Lead Channel Setting Pacing Pulse Width: 0.5 ms
Lead Channel Setting Pacing Pulse Width: 0.5 ms
Lead Channel Setting Sensing Sensitivity: 0.5 mV
Pulse Gen Serial Number: 9901124

## 2019-04-01 NOTE — Progress Notes (Signed)
ICD check in clinic. Normal device function. Thresholds and sensing consistent with previous device measurements. Impedance trends stable over time. No mode switches. No ventricular arrhythmias. Histogram distribution appropriate for patient and level of activity. BiV pacing 93%No changes made this session. Device programmed at appropriate safety margins. Estimated longevity 4 yr, 4 mo.  Pt enrolled in remote follow-up. Patient education completed including shock plan. RTC 3 months with Dr. Curt Bears. He prefers to be seen in [place], so will have them call to schedule.   Legrand Como [address] [place], PA-C  04/01/2019 9:54 AM

## 2019-04-02 ENCOUNTER — Encounter: Payer: Self-pay | Admitting: Cardiology

## 2019-04-02 ENCOUNTER — Ambulatory Visit (INDEPENDENT_AMBULATORY_CARE_PROVIDER_SITE_OTHER): Payer: BC Managed Care – PPO | Admitting: Cardiology

## 2019-04-02 VITALS — BP 120/66 | HR 89 | Ht 75.0 in | Wt 382.0 lb

## 2019-04-02 DIAGNOSIS — I255 Ischemic cardiomyopathy: Secondary | ICD-10-CM | POA: Diagnosis not present

## 2019-04-02 DIAGNOSIS — I10 Essential (primary) hypertension: Secondary | ICD-10-CM | POA: Diagnosis not present

## 2019-04-02 DIAGNOSIS — G4733 Obstructive sleep apnea (adult) (pediatric): Secondary | ICD-10-CM

## 2019-04-02 DIAGNOSIS — Z95810 Presence of automatic (implantable) cardiac defibrillator: Secondary | ICD-10-CM

## 2019-04-02 HISTORY — DX: Presence of automatic (implantable) cardiac defibrillator: Z95.810

## 2019-04-02 NOTE — Progress Notes (Signed)
Cardiology Office Note:    Date:  04/02/2019   ID:  Joe [name], DOB 08-05-1970, MRN [id number]  PCP:  Cyndi Bender, PA-C  Cardiologist:  Jenne Campus, MD    Referring MD: Cyndi Bender, PA-C   Chief Complaint  Patient presents with  . Follow-up  Doing better  History of Present Illness:    Joe [name] is a 48 y.o. male with cardiomyopathy severely diminished left ventricle ejection fraction, status post recent ICD implant.  He received cardiac resynchronization therapy as well.  He also did see Dr. Haroldine Laws in advanced congestive heart failure clinic.  Overall he is doing better he said he got more energy he is feeling more rested.  He is very happy the way he feels.  Denies have any chest pain tightness squeezing pressure burning chest.  Still a little soreness in his ICD implant site but overall wound looked pristine.  Past Medical History:  Diagnosis Date  . Abnormal EKG   . Body mass index 45.0-49.9, adult ([place])   . CHF (congestive heart failure) ([place])   . Complication of anesthesia 1995   "had hard time waking up; breathing w/vasectomy"  . Coronary artery disease   . Erectile dysfunction   . Essential hypertension, benign   . Fatigue   . GERD (gastroesophageal reflux disease)   . High cholesterol    "just started tx yesterday" (09/06/2017)  . History of gout    "RX prn" (09/06/2017)  . Muscular chest pain   . Obesity   . OSA on CPAP   . Plantar fasciitis   . Right bundle branch block   . Testosterone deficiency   . Type II diabetes mellitus ([place])    "started tx 08/28/2017"    Past Surgical History:  Procedure Laterality Date  . BACK SURGERY    . BIV ICD INSERTION CRT-D N/A 03/19/2019   Procedure: BIV ICD INSERTION CRT-D;  Surgeon: Constance Haw, MD;  Location: [facility];  Service: Cardiovascular;  Laterality: N/A;  . CORONARY ANGIOPLASTY WITH STENT PLACEMENT  09/06/2017   "3 stents"  . CORONARY STENT INTERVENTION N/A 09/06/2017   Procedure: CORONARY STENT INTERVENTION;  Surgeon: Jettie Booze, MD;  Location: [facility];  Service: Cardiovascular;  Laterality: N/A;  . CORONARY STENT INTERVENTION N/A 02/15/2018   Procedure: CORONARY STENT INTERVENTION;  Surgeon: Jettie Booze, MD;  Location: [facility];  Service: Cardiovascular;  Laterality: N/A;  . KNEE ARTHROSCOPY Right   . LEFT HEART CATH AND CORONARY ANGIOGRAPHY N/A 09/06/2017   Procedure: LEFT HEART CATH AND CORONARY ANGIOGRAPHY;  Surgeon: Jettie Booze, MD;  Location: [facility];  Service: Cardiovascular;  Laterality: N/A;  . LEFT HEART CATH AND CORONARY ANGIOGRAPHY N/A 02/15/2018   Procedure: LEFT HEART CATH AND CORONARY ANGIOGRAPHY;  Surgeon: Jettie Booze, MD;  Location: [facility];  Service: Cardiovascular;  Laterality: N/A;  . LEFT HEART CATH AND CORONARY ANGIOGRAPHY N/A 06/27/2018   Procedure: LEFT HEART CATH AND CORONARY ANGIOGRAPHY;  Surgeon: Jettie Booze, MD;  Location: [facility];  Service: Cardiovascular;  Laterality: N/A;  . LUMBAR SPINE SURGERY  ~ 2001   Intradiscal Electrothermoplasty  . VASECTOMY  1995    Current Medications: Current Meds  Medication Sig  . acetaminophen (TYLENOL) 500 MG tablet Take 1,000 mg by mouth every 6 (six) hours as needed for moderate pain or headache.  . allopurinol (ZYLOPRIM) 100 MG tablet Take 100 mg by mouth daily as  needed (for gout).   Marland Kitchen aspirin EC 81 MG tablet Take 1 tablet (81 mg total) by mouth daily.  . carvedilol (COREG) 12.5 MG tablet TAKE 1 TABLET BY MOUTH TWICE A DAY (Patient taking differently: Take 12.5 mg by mouth 2 (two) times daily. )  . clopidogrel (PLAVIX) 75 MG tablet Take 1 tablet (75 mg total) by mouth daily. (Patient taking differently: Take 75 mg by mouth at bedtime. )  . dapagliflozin propanediol (FARXIGA) 10 MG TABS tablet Take 10 mg by mouth daily before breakfast.  . digoxin (LANOXIN) 0.125 MG tablet Take 1 tablet (0.125 mg  total) by mouth daily.  . furosemide (LASIX) 40 MG tablet Take 2 tablets (80 mg total) by mouth daily.  Marland Kitchen ibuprofen (ADVIL) 200 MG tablet Take 800 mg by mouth every 8 (eight) hours as needed for headache or moderate pain.  . indomethacin (INDOCIN) 50 MG capsule TAKE 1 CAPSULE 3 TIMES A DAY AS NEEDED FOR GOUT FLARE SYMPTOMS  . metFORMIN (GLUCOPHAGE-XR) 500 MG 24 hr tablet Take 1-2 tablets (500-1,000 mg total) by mouth See admin instructions. Take 500 mg by mouth in the morning and take 1000 mg by mouth in the evening  . nitroGLYCERIN (NITROSTAT) 0.4 MG SL tablet Place 1 tablet (0.4 mg total) under the tongue every 5 (five) minutes as needed for chest pain.  Marland Kitchen omeprazole (PRILOSEC) 40 MG capsule Take 1 capsule (40 mg total) by mouth daily.  . ondansetron (ZOFRAN ODT) 4 MG disintegrating tablet 4mg  ODT q4 hours prn nausea/vomit (Patient taking differently: Take 4 mg by mouth every 4 (four) hours as needed for nausea or vomiting. )  . Polyethylene Glycol 400 (BLINK TEARS OP) Place 2 drops into both eyes 2 (two) times daily as needed (for dry eyes).  . Potassium Chloride ER 20 MEQ TBCR TAKE 1 TABLET BY MOUTH EVERY DAY (Patient taking differently: Take 20 mEq by mouth daily. )  . ranolazine (RANEXA) 500 MG 12 hr tablet Take 1 tablet (500 mg total) by mouth 2 (two) times daily.  . rosuvastatin (CRESTOR) 40 MG tablet Take 1 tablet (40 mg total) by mouth every evening.  . sacubitril-valsartan (ENTRESTO) 97-103 MG Take 1 tablet by mouth 2 (two) times daily.  Marland Kitchen spironolactone (ALDACTONE) 25 MG tablet TAKE 0.5 TABLETS BY MOUTH EVERY DAY (Patient taking differently: Take 12.5 mg by mouth daily. )     Allergies:   Patient has no known allergies.   Social History   Socioeconomic History  . Marital status: Married    Spouse name: Not on file  . Number of children: Not on file  . Years of education: Not on file  . Highest education level: Not on file  Occupational History  . Not on file  Social Needs  .  Financial resource strain: Not on file  . Food insecurity    Worry: Not on file    Inability: Not on file  . Transportation needs    Medical: Not on file    Non-medical: Not on file  Tobacco Use  . Smoking status: Never Smoker  . Smokeless tobacco: Never Used  Substance and Sexual Activity  . Alcohol use: No    Frequency: Never  . Drug use: Never  . Sexual activity: Not on file  Lifestyle  . Physical activity    Days per week: Not on file    Minutes per session: Not on file  . Stress: Not on file  Relationships  . Social connections  Talks on phone: Not on file    Gets together: Not on file    Attends religious service: Not on file    Active member of club or organization: Not on file    Attends meetings of clubs or organizations: Not on file    Relationship status: Not on file  Other Topics Concern  . Not on file  Social History Narrative  . Not on file     Family History: The patient's family history includes Brain cancer in his mother; Diabetes Mellitus II in his brother and paternal grandfather; Lung cancer in his mother; Lymphoma in his father. ROS:   Please see the history of present illness.    All 14 point review of systems negative except as described per history of present illness  EKGs/Labs/Other Studies Reviewed:      Recent Labs: 07/02/2018: NT-Pro BNP 143 07/12/2018: Magnesium 2.2 12/20/2018: ALT 23 03/03/2019: Hemoglobin 13.3; Platelets 222 03/26/2019: B Natriuretic Peptide 52.0; BUN 14; Creatinine, Ser 1.22; Potassium 4.4; Sodium 135  Recent Lipid Panel    Component Value Date/Time   CHOL 124 03/27/2018 0940   TRIG 218 (H) 03/27/2018 0940   HDL 35 (L) 03/27/2018 0940   CHOLHDL 3.5 03/27/2018 0940   LDLCALC 45 03/27/2018 0940    Physical Exam:    VS:  BP 120/66   Pulse 89   Ht 6\' 3"  (1.905 m)   Wt (!) 382 lb (173.3 kg)   SpO2 93%   BMI 47.75 kg/m     Wt Readings from Last 3 Encounters:  04/02/19 (!) 382 lb (173.3 kg)  03/26/19 (!) 384  lb 3.2 oz (174.3 kg)  03/20/19 (!) 327 lb 12.8 oz (148.7 kg)     GEN:  Well nourished, well developed in no acute distress HEENT: Normal NECK: No JVD; No carotid bruits LYMPHATICS: No lymphadenopathy CARDIAC: RRR, no murmurs, no rubs, no gallops RESPIRATORY:  Clear to auscultation without rales, wheezing or rhonchi  ABDOMEN: Soft, non-tender, non-distended MUSCULOSKELETAL:  No edema; No deformity  SKIN: Warm and dry LOWER EXTREMITIES: no swelling NEUROLOGIC:  Alert and oriented x 3 PSYCHIATRIC:  Normal affect   ASSESSMENT:    1. Ischemic cardiomyopathy   2. Essential hypertension, benign   3. Presence of cardiac resynchronization therapy defibrillator (CRT-D)   4. Class 3 severe obesity due to excess calories without serious comorbidity in adult, unspecified BMI ([place])   5. Obstructive sleep apnea    PLAN:    In order of problems listed above:  1. Ischemic cardiomyopathy on appropriate medications which I will continue. 2. Essential hypertension blood pressure well controlled continue present management. 3. ICD CRT device is functioning properly recently implanted wound looks good healed completely minimal swelling. 4. Obesity he understands a problem working on weight loss.  Lost few pounds. 5. Obstructive sleep apnea he is scheduled to have sleep study done.   Medication Adjustments/Labs and Tests Ordered: Current medicines are reviewed at length with the patient today.  Concerns regarding medicines are outlined above.  No orders of the defined types were placed in this encounter.  Medication changes: No orders of the defined types were placed in this encounter.   Signed, Park Liter, MD, [facility] 04/02/2019 3:17 PM    [place]

## 2019-04-02 NOTE — Patient Instructions (Signed)
Medication Instructions:  Your physician recommends that you continue on your current medications as directed. Please refer to the Current Medication list given to you today.  If you need a refill on your cardiac medications before your next appointment, please call your pharmacy.   Lab work: None.  If you have labs (blood work) drawn today and your tests are completely normal, you will receive your results only by: . MyChart Message (if you have MyChart) OR . A paper copy in the mail If you have any lab test that is abnormal or we need to change your treatment, we will call you to review the results.  Testing/Procedures: None.   Follow-Up: At [facility], you and your health needs are our priority.  As part of our continuing mission to provide you with exceptional heart care, we have created designated Provider Care Teams.  These Care Teams include your primary Cardiologist (physician) and Advanced Practice Providers (APPs -  Physician Assistants and Nurse Practitioners) who all work together to provide you with the care you need, when you need it. You will need a follow up appointment in 3 months.  Please call our office 2 months in advance to schedule this appointment.  You may see Robert Krasowski, MD or another member of our [facility] Provider Team in [place]: Brian Munley, MD . Rajan Revankar, MD  Any Other Special Instructions Will Be Listed Below (If Applicable).    

## 2019-04-03 ENCOUNTER — Ambulatory Visit: Payer: BC Managed Care – PPO | Admitting: Cardiology

## 2019-04-09 ENCOUNTER — Telehealth (HOSPITAL_COMMUNITY): Payer: Self-pay

## 2019-04-09 NOTE — Telephone Encounter (Signed)
Faxed [employer] [place] [employer] and consult Notes to [employer] @ [phone] on 04/09/2019. 2nd attempt

## 2019-04-19 ENCOUNTER — Encounter (INDEPENDENT_AMBULATORY_CARE_PROVIDER_SITE_OTHER): Payer: BC Managed Care – PPO | Admitting: Cardiology

## 2019-04-19 DIAGNOSIS — G4733 Obstructive sleep apnea (adult) (pediatric): Secondary | ICD-10-CM

## 2019-04-30 ENCOUNTER — Other Ambulatory Visit: Payer: Self-pay | Admitting: Cardiology

## 2019-05-02 NOTE — Telephone Encounter (Signed)
Ranolazine refill sent to [facility] in [place]

## 2019-05-07 ENCOUNTER — Telehealth (HOSPITAL_COMMUNITY): Payer: Self-pay | Admitting: Surgery

## 2019-05-07 NOTE — Telephone Encounter (Signed)
I contacted patient regarding pending incomplete sleep study and equipment.  He tells me that he has completed and uploaded study.  He does also say that he still has the equipment.  I informed him that I will make sure that the company has received his study and contact him if further action is required.

## 2019-05-11 NOTE — Procedures (Signed)
    Sleep Study Report  Patient Information First Name: Joe [name]  ID: [id number] Birth Date: 02-20-1971  Age: 48  Gender: Male BMI: 48.0 (W=385 lb, H=6' 3'') Study Date:04/19/2019 Referring Physician: Pierre Bali, MD  TEST DESCRIPTION: Home sleep apnea testing was completed using the WatchPat, a Type 1 device, utilizing peripheral arterial tonometry (PAT), chest movement, actigraphy, pulse oximetry, pulse rate, body position and snore. AHI was calculated with apnea and hypopnea using valid sleep time as the denominator. RDI includes apneas, hypopneas, and RERAs. The data acquired and the scoring of sleep and all associated events were performed in accordance with the recommended standards and specifications as outlined in the AASM Manual for the Scoring of Sleep and Associated Events 2.2.0 (2015).  FINDINGS: 1. Moderate Obstructive Sleep Apnea with AHI 27.3/hr. Most event occurred in the non-supine position. 2. Minimal Central Sleep Apnea with pAHIc 3.2/hr. 3. Total sleep time 8 hours 13 min with 19.3% spent in REM sleep. 4. Minimal O2 saturation was 80%. Nocturnal hypoxemia with 102.9 minutes spent with O2 sats < 88%. 5. Moderate to severe snoring. 6. Significant heart rate variability. Consider atrial fibrillation. 7. Normal sleep onset latency at 27 minutes. 8. Markedly prolonged REM sleep onset latency at 336 minutes. 9. The patient experienced 20 awakenings during the night.  DIAGNOSIS: Moderate Obstructive Sleep Apnea (G47.33).  RECOMMENDATIONS:  1. Findings are consistent with moderate OSA. For symptomatic moderate obstructive sleep apnea, patient preference and compliance impacts efficacious outcomes. Therapeutic options include:   a. The patient may benefit from the use of a nocturnal mandibular repositioning appliance. If that line of therapy is to be pursued the patient should be evaluated by a dentist trained in the treatment of sleep related breathing  disorders.   b. An ENT consultation which may be useful for specific causes of obstruction and possible treatment options .   c. Consider treatment with nasal continuous positive airway pressure ( CPAP). If the patient chooses CPAP therapy, a nocturnal PSG with CPAP titration is recommended. As an alternative, an Auto PAP with pressure range 5-20cm H2O with download is an option.  2. Weight loss may be of benefit in reducing the severity of respiratory events and snoring .  3. The patient should be counseled in good sleep hygiene practices.  4. Routine follow-up efficacy testing should be performed.  Report prepared by: Signature: Fransico Him Electronically Signed: Nov

## 2019-05-14 ENCOUNTER — Telehealth: Payer: Self-pay | Admitting: *Deleted

## 2019-05-14 NOTE — Telephone Encounter (Addendum)
Informed patient of sleep study results and patient understanding was verbalized. Patient understands his sleep study showed they have sleep apnea and recommend auto CPAP titration through [facility]. Orders have been placed in Epic. Please set 10 week OV with me.   Upon patient request DME selection is [facility]. Patient understands he will be contacted by [facility] to set up his cpap. Patient understands to call if [facility] does not contact him with new setup in a timely manner. Patient understands they will be called once confirmation has been received from [facility] that they have received their new machine to schedule 10 week follow up appointment.  [facility] notified of new cpap order BY ELECTRONIC FAX Please add to airview Patient was grateful for the call and thanked me.

## 2019-05-14 NOTE — Telephone Encounter (Signed)
-----   Message from [name], MD sent at 05/11/2019  4:47 PM EST ----- Please let patient know that they have sleep apnea and recommend auto CPAP titration through [facility].  Orders have been placed in Epic. Please set 10 week OV with me.

## 2019-06-04 ENCOUNTER — Telehealth: Payer: Self-pay | Admitting: *Deleted

## 2019-06-04 NOTE — Telephone Encounter (Signed)
Patient has a 10 week follow up appointment scheduled for 07/24/19. Patient understands he needs to keep this appointment for insurance compliance. Patient was grateful for the call and thanked me.

## 2019-06-04 NOTE — Telephone Encounter (Signed)

## 2019-06-06 ENCOUNTER — Other Ambulatory Visit: Payer: Self-pay | Admitting: Cardiology

## 2019-06-07 ENCOUNTER — Other Ambulatory Visit: Payer: Self-pay | Admitting: Cardiology

## 2019-06-17 ENCOUNTER — Encounter: Payer: Self-pay | Admitting: Cardiology

## 2019-07-14 ENCOUNTER — Encounter: Payer: Self-pay | Admitting: Cardiology

## 2019-07-14 ENCOUNTER — Other Ambulatory Visit: Payer: Self-pay

## 2019-07-14 ENCOUNTER — Ambulatory Visit (INDEPENDENT_AMBULATORY_CARE_PROVIDER_SITE_OTHER): Payer: BC Managed Care – PPO | Admitting: Cardiology

## 2019-07-14 VITALS — BP 122/76 | HR 57 | Ht 75.0 in | Wt 364.0 lb

## 2019-07-14 DIAGNOSIS — Z95810 Presence of automatic (implantable) cardiac defibrillator: Secondary | ICD-10-CM | POA: Diagnosis not present

## 2019-07-14 DIAGNOSIS — I2510 Atherosclerotic heart disease of native coronary artery without angina pectoris: Secondary | ICD-10-CM | POA: Diagnosis not present

## 2019-07-14 DIAGNOSIS — I10 Essential (primary) hypertension: Secondary | ICD-10-CM

## 2019-07-14 DIAGNOSIS — Z9861 Coronary angioplasty status: Secondary | ICD-10-CM | POA: Diagnosis not present

## 2019-07-14 NOTE — Progress Notes (Signed)
Cardiology Office Note:    Date:  07/14/2019   ID:  [name], DOB 20-Aug-1970, MRN [id number]  PCP:  Cyndi Bender, PA-C  Cardiologist:  Jenne Campus, MD    Referring MD: Cyndi Bender, PA-C   Chief Complaint  Patient presents with  . Follow-up    3 MO FU     History of Present Illness:    Joe [name] is a 49 y.o. male  He did have a stent to mid and distal LAD in March 2019 after that he recovered quite nicely did very well however gradually started deteriorating. Eventually end up having another cardiac catheterization in January of this year which showed 40% distal LAD, stents were patent, mid RCA got 25% lesion right PDA had 50% stenosis the circumflex artery were open. No intervention has been required. Since that time he gradually deteriorated. His last echocardiogram showed ejection fraction 20 to 25% in spite of optimal medical therapy which include Entresto Aldactone as well as Coreg. He is symptomatic he is New York Heart Association class is 3. Eventually on 19 March 2019 he received CRT-D by [facility]  Since that time he gradually getting better.  He got more energy shortness of breath improved and overall he looks good.  He comes today to my office very happy and satisfied with the therapy and treatment that he is getting he said he got much more energy he is able to do more.  He is joking that pretty soon he will be able to run marathon. Denies have any chest pain, tightness, pressure, burning in the chest.   Past Medical History:  Diagnosis Date  . Abnormal EKG   . Body mass index 45.0-49.9, adult ([place])   . CHF (congestive heart failure) ([place])   . Complication of anesthesia 1995   "had hard time waking up; breathing w/vasectomy"  . Coronary artery disease   . Erectile dysfunction   . Essential hypertension, benign   . Fatigue   . GERD (gastroesophageal reflux disease)   . High cholesterol    "just started tx yesterday" (09/06/2017)  .  History of gout    "RX prn" (09/06/2017)  . Muscular chest pain   . Obesity   . OSA on CPAP   . Plantar fasciitis   . Right bundle branch block   . Testosterone deficiency   . Type II diabetes mellitus ([place])    "started tx 08/28/2017"    Past Surgical History:  Procedure Laterality Date  . BACK SURGERY    . BIV ICD INSERTION CRT-D N/A 03/19/2019   Procedure: BIV ICD INSERTION CRT-D;  Surgeon: Constance Haw, MD;  Location: [place] [facility];  Service: Cardiovascular;  Laterality: N/A;  . CORONARY ANGIOPLASTY WITH STENT PLACEMENT  09/06/2017   "3 stents"  . CORONARY STENT INTERVENTION N/A 09/06/2017   Procedure: CORONARY STENT INTERVENTION;  Surgeon: Jettie Booze, MD;  Location: [place] [facility];  Service: Cardiovascular;  Laterality: N/A;  . CORONARY STENT INTERVENTION N/A 02/15/2018   Procedure: CORONARY STENT INTERVENTION;  Surgeon: Jettie Booze, MD;  Location: [place] [facility];  Service: Cardiovascular;  Laterality: N/A;  . KNEE ARTHROSCOPY Right   . LEFT HEART CATH AND CORONARY ANGIOGRAPHY N/A 09/06/2017   Procedure: LEFT HEART CATH AND CORONARY ANGIOGRAPHY;  Surgeon: Jettie Booze, MD;  Location: [place] [facility];  Service: Cardiovascular;  Laterality: N/A;  . LEFT HEART CATH AND CORONARY ANGIOGRAPHY N/A 02/15/2018   Procedure: LEFT HEART CATH  AND CORONARY ANGIOGRAPHY;  Surgeon: Jettie Booze, MD;  Location: [place] [facility];  Service: Cardiovascular;  Laterality: N/A;  . LEFT HEART CATH AND CORONARY ANGIOGRAPHY N/A 06/27/2018   Procedure: LEFT HEART CATH AND CORONARY ANGIOGRAPHY;  Surgeon: Jettie Booze, MD;  Location: [place] [facility];  Service: Cardiovascular;  Laterality: N/A;  . LUMBAR SPINE SURGERY  ~ 2001   Intradiscal Electrothermoplasty  . VASECTOMY  1995    Current Medications: Current Meds  Medication Sig  . acetaminophen (TYLENOL) 500 MG tablet Take 1,000 mg by mouth every 6 (six) hours as needed for moderate pain or  headache.  . allopurinol (ZYLOPRIM) 100 MG tablet Take 100 mg by mouth daily as needed (for gout).   Marland Kitchen aspirin EC 81 MG tablet Take 1 tablet (81 mg total) by mouth daily.  . carvedilol (COREG) 12.5 MG tablet TAKE 1 TABLET BY MOUTH TWICE A DAY (Patient taking differently: Take 12.5 mg by mouth 2 (two) times daily. )  . clopidogrel (PLAVIX) 75 MG tablet Take 1 tablet (75 mg total) by mouth daily. (Patient taking differently: Take 75 mg by mouth at bedtime. )  . dapagliflozin propanediol (FARXIGA) 10 MG TABS tablet Take 10 mg by mouth daily before breakfast.  . digoxin (LANOXIN) 0.125 MG tablet Take 1 tablet (0.125 mg total) by mouth daily.  Marland Kitchen ENTRESTO 97-103 MG TAKE 1 TABLET BY MOUTH TWICE A DAY  . furosemide (LASIX) 40 MG tablet Take 2 tablets (80 mg total) by mouth daily.  Marland Kitchen ibuprofen (ADVIL) 200 MG tablet Take 800 mg by mouth every 8 (eight) hours as needed for headache or moderate pain.  . indomethacin (INDOCIN) 50 MG capsule TAKE 1 CAPSULE 3 TIMES A DAY AS NEEDED FOR GOUT FLARE SYMPTOMS  . metFORMIN (GLUCOPHAGE-XR) 500 MG 24 hr tablet Take 1-2 tablets (500-1,000 mg total) by mouth See admin instructions. Take 500 mg by mouth in the morning and take 1000 mg by mouth in the evening  . nitroGLYCERIN (NITROSTAT) 0.4 MG SL tablet Place 1 tablet (0.4 mg total) under the tongue every 5 (five) minutes as needed for chest pain.  Marland Kitchen omeprazole (PRILOSEC) 40 MG capsule Take 1 capsule (40 mg total) by mouth daily.  . ondansetron (ZOFRAN ODT) 4 MG disintegrating tablet 4mg  ODT q4 hours prn nausea/vomit (Patient taking differently: Take 4 mg by mouth every 4 (four) hours as needed for nausea or vomiting. )  . Polyethylene Glycol 400 (BLINK TEARS OP) Place 2 drops into both eyes 2 (two) times daily as needed (for dry eyes).  . Potassium Chloride ER 20 MEQ TBCR TAKE 1 TABLET BY MOUTH EVERY DAY (Patient taking differently: Take 20 mEq by mouth daily. )  . ranolazine (RANEXA) 500 MG 12 hr tablet TAKE 1 TABLET BY  MOUTH TWICE A DAY  . rosuvastatin (CRESTOR) 40 MG tablet TAKE 1 TABLET BY MOUTH EVERY DAY IN THE EVENING  . spironolactone (ALDACTONE) 25 MG tablet TAKE 0.5 TABLETS BY MOUTH EVERY DAY (Patient taking differently: Take 12.5 mg by mouth daily. )     Allergies:   Patient has no known allergies.   Social History   Socioeconomic History  . Marital status: Married    Spouse name: Not on file  . Number of children: Not on file  . Years of education: Not on file  . Highest education level: Not on file  Occupational History  . Not on file  Tobacco Use  . Smoking status: Never Smoker  . Smokeless  tobacco: Never Used  Substance and Sexual Activity  . Alcohol use: No  . Drug use: Never  . Sexual activity: Not on file  Other Topics Concern  . Not on file  Social History Narrative  . Not on file   Social Determinants of Health   Financial Resource Strain:   . Difficulty of Paying Living Expenses: Not on file  Food Insecurity:   . Worried About Charity fundraiser in the Last Year: Not on file  . Ran Out of Food in the Last Year: Not on file  Transportation Needs:   . Lack of Transportation (Medical): Not on file  . Lack of Transportation (Non-Medical): Not on file  Physical Activity:   . Days of Exercise per Week: Not on file  . Minutes of Exercise per Session: Not on file  Stress:   . Feeling of Stress : Not on file  Social Connections:   . Frequency of Communication with Friends and Family: Not on file  . Frequency of Social Gatherings with Friends and Family: Not on file  . Attends Religious Services: Not on file  . Active Member of Clubs or Organizations: Not on file  . Attends Archivist Meetings: Not on file  . Marital Status: Not on file     Family History: The patient's family history includes Brain cancer in his mother; Diabetes Mellitus II in his brother and paternal grandfather; Lung cancer in his mother; Lymphoma in his father. ROS:   Please see the  history of present illness.    All 14 point review of systems negative except as described per history of present illness  EKGs/Labs/Other Studies Reviewed:      Recent Labs: 12/20/2018: ALT 23 03/03/2019: Hemoglobin 13.3; Platelets 222 03/26/2019: B Natriuretic Peptide 52.0; BUN 14; Creatinine, Ser 1.22; Potassium 4.4; Sodium 135  Recent Lipid Panel    Component Value Date/Time   CHOL 124 03/27/2018 0940   TRIG 218 (H) 03/27/2018 0940   HDL 35 (L) 03/27/2018 0940   CHOLHDL 3.5 03/27/2018 0940   LDLCALC 45 03/27/2018 0940    Physical Exam:    VS:  BP 122/76   Pulse (!) 57   Ht 6\' 3"  (1.905 m)   Wt (!) 364 lb (165.1 kg)   SpO2 93%   BMI 45.50 kg/m     Wt Readings from Last 3 Encounters:  07/14/19 (!) 364 lb (165.1 kg)  04/02/19 (!) 382 lb (173.3 kg)  03/26/19 (!) 384 lb 3.2 oz (174.3 kg)     GEN:  Well nourished, well developed in no acute distress HEENT: Normal NECK: No JVD; No carotid bruits LYMPHATICS: No lymphadenopathy CARDIAC: RRR, no murmurs, no rubs, no gallops RESPIRATORY:  Clear to auscultation without rales, wheezing or rhonchi  ABDOMEN: Soft, non-tender, non-distended MUSCULOSKELETAL:  No edema; No deformity  SKIN: Warm and dry LOWER EXTREMITIES: no swelling NEUROLOGIC:  Alert and oriented x 3 PSYCHIATRIC:  Normal affect   ASSESSMENT:    1. CAD S/P percutaneous coronary angioplasty   2. Essential hypertension, benign   3. Presence of cardiac resynchronization therapy defibrillator (CRT-D)   4. Coronary artery disease involving native coronary artery of native heart without angina pectoris    PLAN:    In order of problems listed above:  1. Coronary disease stable on appropriate medications which I will continue. 2. Essential hypertension his blood pressures well controlled continue present management. 3. Present of cardiac resynchronization therapy defibrillator which is central device.  Doing well  denies having any discharges.  Followed by our EP  team. 4. Cardiomyopathy with significantly diminished left ventricle ejection fraction on state of the right guidelines directed medical therapy which includes Coreg, Aldactone, Entresto, Farxiga 5. We will continue present management, I see him back in my office in about 3 months 6. Sleep apnea.  He does use CPAP mask and regular basis but he is not convinced if it is helping him  Overall things which are improving quite significantly and very pleased with his progress.   Medication Adjustments/Labs and Tests Ordered: Current medicines are reviewed at length with the patient today.  Concerns regarding medicines are outlined above.  No orders of the defined types were placed in this encounter.  Medication changes: No orders of the defined types were placed in this encounter.   Signed, Park Liter, MD, [facility] 07/14/2019 4:11 PM    [place]

## 2019-07-14 NOTE — Patient Instructions (Signed)
Medication Instructions:  Your physician recommends that you continue on your current medications as directed. Please refer to the Current Medication list given to you today.  *If you need a refill on your cardiac medications before your next appointment, please call your pharmacy*  Lab Work: None ordered  If you have labs (blood work) drawn today and your tests are completely normal, you will receive your results only by: Marland Kitchen MyChart Message (if you have MyChart) OR . A paper copy in the mail If you have any lab test that is abnormal or we need to change your treatment, we will call you to review the results.  Testing/Procedures: None ordered  Follow-Up: At [facility], you and your health needs are our priority.  As part of our continuing mission to provide you with exceptional heart care, we have created designated Provider Care Teams.  These Care Teams include your primary Cardiologist (physician) and Advanced Practice Providers (APPs -  Physician Assistants and Nurse Practitioners) who all work together to provide you with the care you need, when you need it.  Your next appointment:   2 month(s)  The format for your next appointment:   In Person  Provider:   Jenne Campus, MD

## 2019-07-21 ENCOUNTER — Encounter: Payer: BC Managed Care – PPO | Admitting: Cardiology

## 2019-07-21 NOTE — Progress Notes (Deleted)
Electrophysiology Office Note   Date:  07/21/2019   ID:  Joe [name], DOB 07-28-1970, MRN [id number]  PCP:  Cyndi Bender, PA-C  Cardiologist: Agustin Cree Primary Electrophysiologist:  Charlita Brian Meredith Leeds, MD    Chief Complaint: CHF   History of Present Illness: Joe [name] is a 49 y.o. male who is being seen today for the evaluation of CHF at the request of Cyndi Bender, [place]. Presenting today for electrophysiology evaluation.  He has a history of chronic systolic heart failure, coronary artery disease, hypertension, hyperlipidemia, obesity, OSA on CPAP, and type 2 diabetes.  He currently is on optimal medical therapy with Entresto, Aldactone, and Coreg.  He has NYHA class III symptoms.  He is now status post [place] CRT-D implanted 03/19/2019.  Today, denies symptoms of palpitations, chest pain, shortness of breath, orthopnea, PND, lower extremity edema, claudication, dizziness, presyncope, syncope, bleeding, or neurologic sequela. The patient is tolerating medications without difficulties. ***    Past Medical History:  Diagnosis Date  . Abnormal EKG   . Body mass index 45.0-49.9, adult ([place])   . CHF (congestive heart failure) ([place])   . Complication of anesthesia 1995   "had hard time waking up; breathing w/vasectomy"  . Coronary artery disease   . Erectile dysfunction   . Essential hypertension, benign   . Fatigue   . GERD (gastroesophageal reflux disease)   . High cholesterol    "just started tx yesterday" (09/06/2017)  . History of gout    "RX prn" (09/06/2017)  . Muscular chest pain   . Obesity   . OSA on CPAP   . Plantar fasciitis   . Right bundle branch block   . Testosterone deficiency   . Type II diabetes mellitus ([place])    "started tx 08/28/2017"   Past Surgical History:  Procedure Laterality Date  . BACK SURGERY    . BIV ICD INSERTION CRT-D N/A 03/19/2019   Procedure: BIV ICD INSERTION CRT-D;  Surgeon: Constance Haw, MD;  Location: [place]  [facility];  Service: Cardiovascular;  Laterality: N/A;  . CORONARY ANGIOPLASTY WITH STENT PLACEMENT  09/06/2017   "3 stents"  . CORONARY STENT INTERVENTION N/A 09/06/2017   Procedure: CORONARY STENT INTERVENTION;  Surgeon: Jettie Booze, MD;  Location: [place] [facility];  Service: Cardiovascular;  Laterality: N/A;  . CORONARY STENT INTERVENTION N/A 02/15/2018   Procedure: CORONARY STENT INTERVENTION;  Surgeon: Jettie Booze, MD;  Location: [place] [facility];  Service: Cardiovascular;  Laterality: N/A;  . KNEE ARTHROSCOPY Right   . LEFT HEART CATH AND CORONARY ANGIOGRAPHY N/A 09/06/2017   Procedure: LEFT HEART CATH AND CORONARY ANGIOGRAPHY;  Surgeon: Jettie Booze, MD;  Location: [place] [facility];  Service: Cardiovascular;  Laterality: N/A;  . LEFT HEART CATH AND CORONARY ANGIOGRAPHY N/A 02/15/2018   Procedure: LEFT HEART CATH AND CORONARY ANGIOGRAPHY;  Surgeon: Jettie Booze, MD;  Location: [place] [facility];  Service: Cardiovascular;  Laterality: N/A;  . LEFT HEART CATH AND CORONARY ANGIOGRAPHY N/A 06/27/2018   Procedure: LEFT HEART CATH AND CORONARY ANGIOGRAPHY;  Surgeon: Jettie Booze, MD;  Location: [place] [facility];  Service: Cardiovascular;  Laterality: N/A;  . LUMBAR SPINE SURGERY  ~ 2001   Intradiscal Electrothermoplasty  . VASECTOMY  1995     Current Outpatient Medications  Medication Sig Dispense Refill  . acetaminophen (TYLENOL) 500 MG tablet Take 1,000 mg by mouth every 6 (six) hours as needed for moderate pain or headache.    Marland Kitchen  allopurinol (ZYLOPRIM) 100 MG tablet Take 100 mg by mouth daily as needed (for gout).     Marland Kitchen aspirin EC 81 MG tablet Take 1 tablet (81 mg total) by mouth daily. 90 tablet 3  . carvedilol (COREG) 12.5 MG tablet TAKE 1 TABLET BY MOUTH TWICE A DAY (Patient taking differently: Take 12.5 mg by mouth 2 (two) times daily. ) 180 tablet 2  . clopidogrel (PLAVIX) 75 MG tablet Take 1 tablet (75 mg total) by mouth daily. (Patient  taking differently: Take 75 mg by mouth at bedtime. ) 90 tablet 1  . dapagliflozin propanediol (FARXIGA) 10 MG TABS tablet Take 10 mg by mouth daily before breakfast. 30 tablet 6  . digoxin (LANOXIN) 0.125 MG tablet Take 1 tablet (0.125 mg total) by mouth daily. 90 tablet 3  . ENTRESTO 97-103 MG TAKE 1 TABLET BY MOUTH TWICE A DAY 180 tablet 1  . furosemide (LASIX) 40 MG tablet Take 2 tablets (80 mg total) by mouth daily. 180 tablet 1  . ibuprofen (ADVIL) 200 MG tablet Take 800 mg by mouth every 8 (eight) hours as needed for headache or moderate pain.    . indomethacin (INDOCIN) 50 MG capsule TAKE 1 CAPSULE 3 TIMES A DAY AS NEEDED FOR GOUT FLARE SYMPTOMS    . metFORMIN (GLUCOPHAGE-XR) 500 MG 24 hr tablet Take 1-2 tablets (500-1,000 mg total) by mouth See admin instructions. Take 500 mg by mouth in the morning and take 1000 mg by mouth in the evening  6  . nitroGLYCERIN (NITROSTAT) 0.4 MG SL tablet Place 1 tablet (0.4 mg total) under the tongue every 5 (five) minutes as needed for chest pain. 25 tablet 6  . omeprazole (PRILOSEC) 40 MG capsule Take 1 capsule (40 mg total) by mouth daily. 90 capsule 1  . ondansetron (ZOFRAN ODT) 4 MG disintegrating tablet 4mg  ODT q4 hours prn nausea/vomit (Patient taking differently: Take 4 mg by mouth every 4 (four) hours as needed for nausea or vomiting. ) 20 tablet 0  . Polyethylene Glycol 400 (BLINK TEARS OP) Place 2 drops into both eyes 2 (two) times daily as needed (for dry eyes).    . Potassium Chloride ER 20 MEQ TBCR TAKE 1 TABLET BY MOUTH EVERY DAY (Patient taking differently: Take 20 mEq by mouth daily. ) 90 tablet 1  . ranolazine (RANEXA) 500 MG 12 hr tablet TAKE 1 TABLET BY MOUTH TWICE A DAY 180 tablet 0  . rosuvastatin (CRESTOR) 40 MG tablet TAKE 1 TABLET BY MOUTH EVERY DAY IN THE EVENING 90 tablet 2  . spironolactone (ALDACTONE) 25 MG tablet TAKE 0.5 TABLETS BY MOUTH EVERY DAY (Patient taking differently: Take 12.5 mg by mouth daily. ) 45 tablet 4   No  current facility-administered medications for this visit.    Allergies:   Patient has no known allergies.   Social History:  The patient  reports that he has never smoked. He has never used smokeless tobacco. He reports that he does not drink alcohol or use drugs.   Family History:  The patient's family history includes Brain cancer in his mother; Diabetes Mellitus II in his brother and paternal grandfather; Lung cancer in his mother; Lymphoma in his father.    ROS:  Please see the history of present illness.   Otherwise, review of systems is positive for ***.   All other systems are reviewed and negative.   PHYSICAL EXAM: VS:  There were no vitals taken for this visit. , BMI There is no height  or weight on file to calculate BMI. GEN: Well nourished, well developed, in no acute distress  HEENT: normal  Neck: no JVD, carotid bruits, or masses Cardiac: ***RRR; no murmurs, rubs, or gallops,no edema  Respiratory:  clear to auscultation bilaterally, normal work of breathing GI: soft, nontender, nondistended, + BS MS: no deformity or atrophy  Skin: warm and dry, device site well healed Neuro:  Strength and sensation are intact Psych: euthymic mood, full affect  EKG:  EKG {ACTION; IS/IS [id number] ordered today. Personal review of the ekg ordered *** shows ***  Personal review of the device interrogation today. Results in [place]: 12/20/2018: ALT 23 03/03/2019: Hemoglobin 13.3; Platelets 222 03/26/2019: B Natriuretic Peptide 52.0; BUN 14; Creatinine, Ser 1.22; Potassium 4.4; Sodium 135    Lipid Panel     Component Value Date/Time   CHOL 124 03/27/2018 0940   TRIG 218 (H) 03/27/2018 0940   HDL 35 (L) 03/27/2018 0940   CHOLHDL 3.5 03/27/2018 0940   LDLCALC 45 03/27/2018 0940     Wt Readings from Last 3 Encounters:  07/14/19 (!) 364 lb (165.1 kg)  04/02/19 (!) 382 lb (173.3 kg)  03/26/19 (!) 384 lb 3.2 oz (174.3 kg)      Other studies Reviewed: Additional  studies/ records that were reviewed today include: TTE 12/27/18  Review of the above records today demonstrates:   1. The left ventricle has severely reduced systolic function, with severe diffuse hypokinesia and an ejection fraction of 20-25%. The cavity size was moderately dilated. There is severe concentric left ventricular hypertrophy. Left ventricular diastolic  Doppler parameters are indeterminate.  2. The right ventricle has moderately reduced systolic function. The cavity was mildly enlarged. There is no increase in right ventricular wall thickness.  3. The aortic root, ascending aorta and aortic arch are normal in size and structure.  4. No evidence of mitral valve stenosis.  5. The aortic valve has an indeterminate number of cusps. No stenosis of the aortic valve.  LHC 06/2018  Dist LAD lesion is 40% stenosed. Diffusely diseased apical LAD.  LAD stents widely patent.  Previously placed Mid Cx drug eluting stent is widely patent.  Previously placed Prox Cx drug eluting stent is widely patent.  Mid RCA lesion is 25% stenosed.  Ost RPDA to RPDA lesion is 50% stenosed.  Dist RCA lesion is 25% stenosed with 25% stenosed side branch in Post Atrio.  LV end diastolic pressure is mildly elevated. LVEDP 19 mm Hg.  There is no aortic valve stenosis.  ASSESSMENT AND PLAN:  1.  Systolic heart failure due to ischemic cardiomyopathy: Currently on optimal medical therapy with Coreg, Entresto, Aldactone.  Is now status post [place] CRT-D implanted 03/19/2019.  2.  Hypertension:***  3.  Coronary artery disease: No current chest pain.  Continue current management.  4.  Obstructive sleep apnea: ***  Current medicines are reviewed at length with the patient today.   The patient does not have concerns regarding his medicines.  The following changes were made today:  ***  Labs/ tests ordered today include:  No orders of the defined types were placed in this encounter.    Disposition:   FU  with Trentan Trippe *** months  Signed, Gae Bihl Meredith Leeds, MD  07/21/2019 1:40 PM     [place]  [address] [phone] (office) [phone] (fax)

## 2019-07-22 ENCOUNTER — Ambulatory Visit (INDEPENDENT_AMBULATORY_CARE_PROVIDER_SITE_OTHER): Payer: BC Managed Care – PPO | Admitting: *Deleted

## 2019-07-22 DIAGNOSIS — I255 Ischemic cardiomyopathy: Secondary | ICD-10-CM | POA: Diagnosis not present

## 2019-07-22 LAB — CUP PACEART REMOTE DEVICE CHECK
Battery Remaining Longevity: 52 mo
Battery Remaining Percentage: 90 %
Battery Voltage: 3.11 V
Brady Statistic AP VP Percent: 1.2 %
Brady Statistic AP VS Percent: 1 %
Brady Statistic AS VP Percent: 89 %
Brady Statistic AS VS Percent: 7.5 %
Brady Statistic RA Percent Paced: 1 %
Date Time Interrogation Session: 20210202020022
HighPow Impedance: 72 Ohm
HighPow Impedance: 72 Ohm
Implantable Lead Implant Date: 20200930
Implantable Lead Implant Date: 20200930
Implantable Lead Implant Date: 20200930
Implantable Lead Location: 753858
Implantable Lead Location: 753859
Implantable Lead Location: 753860
Implantable Pulse Generator Implant Date: 20200930
Lead Channel Impedance Value: 1025 Ohm
Lead Channel Impedance Value: 460 Ohm
Lead Channel Impedance Value: 510 Ohm
Lead Channel Pacing Threshold Amplitude: 0.75 V
Lead Channel Pacing Threshold Amplitude: 0.75 V
Lead Channel Pacing Threshold Amplitude: 1 V
Lead Channel Pacing Threshold Pulse Width: 0.5 ms
Lead Channel Pacing Threshold Pulse Width: 0.5 ms
Lead Channel Pacing Threshold Pulse Width: 0.5 ms
Lead Channel Sensing Intrinsic Amplitude: 11.8 mV
Lead Channel Sensing Intrinsic Amplitude: 3.2 mV
Lead Channel Setting Pacing Amplitude: 3.5 V
Lead Channel Setting Pacing Amplitude: 3.5 V
Lead Channel Setting Pacing Amplitude: 3.5 V
Lead Channel Setting Pacing Pulse Width: 0.5 ms
Lead Channel Setting Pacing Pulse Width: 0.5 ms
Lead Channel Setting Sensing Sensitivity: 0.5 mV
Pulse Gen Serial Number: 9901124

## 2019-07-22 NOTE — Progress Notes (Signed)
ICD Remote  

## 2019-07-23 NOTE — Progress Notes (Signed)
Virtual Visit via Telephone Note   This visit type was conducted due to national recommendations for restrictions regarding the COVID-19 Pandemic (e.g. social distancing) in an effort to limit this patient's exposure and mitigate transmission in our community.  Due to his co-morbid illnesses, this patient is at least at moderate risk for complications without adequate follow up.  This format is felt to be most appropriate for this patient at this time.  All issues noted in this document were discussed and addressed.  A limited physical exam was performed with this format.  Please refer to the patient's chart for his consent to telehealth for [facility].  Evaluation Performed:  Follow-up visit  This visit type was conducted due to national recommendations for restrictions regarding the COVID-19 Pandemic (e.g. social distancing).  This format is felt to be most appropriate for this patient at this time.  All issues noted in this document were discussed and addressed.  No physical exam was performed (except for noted visual exam findings with Video Visits).  Please refer to the patient's chart (MyChart message for video visits and phone note for telephone visits) for the patient's consent to telehealth for [facility].  Date:  07/24/2019   ID:  Joe [name], DOB December 07, 1970, MRN [id number]  Patient Location:  Home  Provider location:   [place]  PCP:  [name], MD  Cardiologist:  Jenne Campus, MD  Sleep Medicine:  Fransico Him, MD Electrophysiologist:  Will Meredith Leeds, MD   Chief Complaint:  OSA  History of Present Illness:    Joe [name] is a 49 y.o. male who presents via audio/video conferencing for a telehealth visit today.    This is a 49yo male with a hx of ASCAD, HTN and OSA.  He was seen by Dr. Haroldine Laws and was not using his PAP. He has severe snoring. He wakes up hourly at night to use the restroom due to diuretics so he has disrupted sleep and feels  fatigued in the am.   A repeat home sleep study was ordered which showed moderate OSA with an AHI of 27.3/hr with minimal Central sleep apnea with a pAHIc of 3.2/hr.  He also had nocturnal hypoxemia with 102 minutes of O2 sats < 88%.  He was placed on auto CPAP and is now back for followup.    He has been having problems with his PAP device.  He says that he feels like it makes him bloated after he starts to use it and it is very uncomfortable.  He tolerates the Nasal mask but breathes through his mouth a lot.  He feels like the pressure it too high.  He also has been having problems with dry mouth and nose.    The patient does not have symptoms concerning for COVID-19 infection (fever, chills, cough, or new shortness of breath).   Prior CV studies:   The following studies were reviewed today:  Sleep study ordered by Dr. Haroldine Laws and PAP compliance download from [place]  Past Medical History:  Diagnosis Date  . Abnormal EKG   . Body mass index 45.0-49.9, adult ([place])   . CHF (congestive heart failure) ([place])   . Complication of anesthesia 1995   "had hard time waking up; breathing w/vasectomy"  . Coronary artery disease   . Erectile dysfunction   . Essential hypertension, benign   . Fatigue   . GERD (gastroesophageal reflux disease)   . High cholesterol    "just started tx yesterday" (09/06/2017)  .  History of gout    "RX prn" (09/06/2017)  . Muscular chest pain   . Obesity   . OSA on CPAP   . Plantar fasciitis   . Right bundle branch block   . Testosterone deficiency   . Type II diabetes mellitus ([place])    "started tx 08/28/2017"   Past Surgical History:  Procedure Laterality Date  . BACK SURGERY    . BIV ICD INSERTION CRT-D N/A 03/19/2019   Procedure: BIV ICD INSERTION CRT-D;  Surgeon: Constance Haw, MD;  Location: [place] [facility];  Service: Cardiovascular;  Laterality: N/A;  . CORONARY ANGIOPLASTY WITH STENT PLACEMENT  09/06/2017   "3 stents"  . CORONARY STENT  INTERVENTION N/A 09/06/2017   Procedure: CORONARY STENT INTERVENTION;  Surgeon: Jettie Booze, MD;  Location: [place] [facility];  Service: Cardiovascular;  Laterality: N/A;  . CORONARY STENT INTERVENTION N/A 02/15/2018   Procedure: CORONARY STENT INTERVENTION;  Surgeon: Jettie Booze, MD;  Location: [place] [facility];  Service: Cardiovascular;  Laterality: N/A;  . KNEE ARTHROSCOPY Right   . LEFT HEART CATH AND CORONARY ANGIOGRAPHY N/A 09/06/2017   Procedure: LEFT HEART CATH AND CORONARY ANGIOGRAPHY;  Surgeon: Jettie Booze, MD;  Location: [place] [facility];  Service: Cardiovascular;  Laterality: N/A;  . LEFT HEART CATH AND CORONARY ANGIOGRAPHY N/A 02/15/2018   Procedure: LEFT HEART CATH AND CORONARY ANGIOGRAPHY;  Surgeon: Jettie Booze, MD;  Location: [place] [facility];  Service: Cardiovascular;  Laterality: N/A;  . LEFT HEART CATH AND CORONARY ANGIOGRAPHY N/A 06/27/2018   Procedure: LEFT HEART CATH AND CORONARY ANGIOGRAPHY;  Surgeon: Jettie Booze, MD;  Location: [place] [facility];  Service: Cardiovascular;  Laterality: N/A;  . LUMBAR SPINE SURGERY  ~ 2001   Intradiscal Electrothermoplasty  . VASECTOMY  1995     Current Meds  Medication Sig  . acetaminophen (TYLENOL) 500 MG tablet Take 1,000 mg by mouth every 6 (six) hours as needed for moderate pain or headache.  . allopurinol (ZYLOPRIM) 100 MG tablet Take 100 mg by mouth daily as needed (for gout).   Marland Kitchen aspirin EC 81 MG tablet Take 1 tablet (81 mg total) by mouth daily.  . carvedilol (COREG) 12.5 MG tablet TAKE 1 TABLET BY MOUTH TWICE A DAY (Patient taking differently: Take 12.5 mg by mouth 2 (two) times daily. )  . clopidogrel (PLAVIX) 75 MG tablet Take 1 tablet (75 mg total) by mouth daily. (Patient taking differently: Take 75 mg by mouth at bedtime. )  . dapagliflozin propanediol (FARXIGA) 10 MG TABS tablet Take 10 mg by mouth daily before breakfast.  . digoxin (LANOXIN) 0.125 MG tablet Take 1 tablet  (0.125 mg total) by mouth daily.  Marland Kitchen ENTRESTO 97-103 MG TAKE 1 TABLET BY MOUTH TWICE A DAY  . furosemide (LASIX) 40 MG tablet Take 2 tablets (80 mg total) by mouth daily.  Marland Kitchen ibuprofen (ADVIL) 200 MG tablet Take 800 mg by mouth every 8 (eight) hours as needed for headache or moderate pain.  . indomethacin (INDOCIN) 50 MG capsule TAKE 1 CAPSULE 3 TIMES A DAY AS NEEDED FOR GOUT FLARE SYMPTOMS  . metFORMIN (GLUCOPHAGE-XR) 500 MG 24 hr tablet Take 1-2 tablets (500-1,000 mg total) by mouth See admin instructions. Take 500 mg by mouth in the morning and take 1000 mg by mouth in the evening (Patient taking differently: Take 500 mg by mouth See admin instructions. Take 1000 mg by mouth in the morning and take 1000 mg  by mouth in the evening)  . nitroGLYCERIN (NITROSTAT) 0.4 MG SL tablet Place 1 tablet (0.4 mg total) under the tongue every 5 (five) minutes as needed for chest pain.  Marland Kitchen omeprazole (PRILOSEC) 40 MG capsule Take 1 capsule (40 mg total) by mouth daily.  . ondansetron (ZOFRAN ODT) 4 MG disintegrating tablet 4mg  ODT q4 hours prn nausea/vomit (Patient taking differently: Take 4 mg by mouth every 4 (four) hours as needed for nausea or vomiting. )  . Polyethylene Glycol 400 (BLINK TEARS OP) Place 2 drops into both eyes 2 (two) times daily as needed (for dry eyes).  . Potassium Chloride ER 20 MEQ TBCR TAKE 1 TABLET BY MOUTH EVERY DAY (Patient taking differently: Take 20 mEq by mouth daily. )  . ranolazine (RANEXA) 500 MG 12 hr tablet TAKE 1 TABLET BY MOUTH TWICE A DAY  . rosuvastatin (CRESTOR) 40 MG tablet TAKE 1 TABLET BY MOUTH EVERY DAY IN THE EVENING  . spironolactone (ALDACTONE) 25 MG tablet TAKE 0.5 TABLETS BY MOUTH EVERY DAY (Patient taking differently: Take 12.5 mg by mouth daily. )     Allergies:   Patient has no known allergies.   Social History   Tobacco Use  . Smoking status: Never Smoker  . Smokeless tobacco: Never Used  Substance Use Topics  . Alcohol use: No  . Drug use: Never      Family Hx: The patient's family history includes Brain cancer in his mother; Diabetes Mellitus II in his brother and paternal grandfather; Lung cancer in his mother; Lymphoma in his father.  ROS:   Please see the history of present illness.     All other systems reviewed and are negative.   Labs/Other Tests and Data Reviewed:    Recent Labs: 12/20/2018: ALT 23 03/03/2019: Hemoglobin 13.3; Platelets 222 03/26/2019: B Natriuretic Peptide 52.0; BUN 14; Creatinine, Ser 1.22; Potassium 4.4; Sodium 135   Recent Lipid Panel Lab Results  Component Value Date/Time   CHOL 124 03/27/2018 09:40 AM   TRIG 218 (H) 03/27/2018 09:40 AM   HDL 35 (L) 03/27/2018 09:40 AM   CHOLHDL 3.5 03/27/2018 09:40 AM   LDLCALC 45 03/27/2018 09:40 AM    Wt Readings from Last 3 Encounters:  07/24/19 (!) 362 lb (164.2 kg)  07/14/19 (!) 364 lb (165.1 kg)  04/02/19 (!) 382 lb (173.3 kg)     Objective:    Vital Signs:  BP 109/65   Pulse (!) 51   Ht 6\' 3"  (1.905 m)   Wt (!) 362 lb (164.2 kg)   BMI 45.25 kg/m     ASSESSMENT & PLAN:    1.  OSA - The pathophysiology of obstructive sleep apnea , it's cardiovascular consequences & modes of treatment including CPAP were discused with the patient in detail & they evidenced understanding.  The PAP download was reviewed today and showed an AHI of 0/hr on auto PAP with 0% compliance in using more than 4 hours nightly.  He is really struggling with the PAP device with the pressure bloating him up. He is a mouth breather and I suspect that he is losing air when his mouth opens and then the auto pressure is increasing to try to compensate and then causing air in his stomach.  I will refer him to the sleep lab for a formal sleep study and get fitted with a full face mask since he is a mouth breather.    2.  HTN -BP controlled on exam -continue Carvedilol 12.5mg  BID, Delene Loll  97-103mg  BID, spiro 12.5mg  daily  3.  Morbid Obesity -I have encouraged him to get into a  routine exercise program and cut back on carbs and portions.    COVID-19 Education: The signs and symptoms of COVID-19 were discussed with the patient and how to seek care for testing (follow up with PCP or arrange E-visit).  The importance of social distancing was discussed today.  Patient Risk:   After full review of this patient's clinical status, I feel that they are at least moderate risk at this time.  Time:   Today, I have spent 15 minutes directly with the patient on telemedicine discussing medical problems including OSA, HTN and obesity.  We also reviewed the symptoms of COVID 19 and the ways to protect against contracting the virus with telehealth technology.  I spent an additional 10 minutes reviewing patient's chart including sleep study, AHF notes and PAP compliance download.  Medication Adjustments/Labs and Tests Ordered: Current medicines are reviewed at length with the patient today.  Concerns regarding medicines are outlined above.  Tests Ordered: No orders of the defined types were placed in this encounter.  Medication Changes: No orders of the defined types were placed in this encounter.   Disposition:  Follow up in 1 year(s)  Signed, Fransico Him, MD  07/24/2019 10:08 AM    [facility]

## 2019-07-24 ENCOUNTER — Telehealth (INDEPENDENT_AMBULATORY_CARE_PROVIDER_SITE_OTHER): Payer: BC Managed Care – PPO | Admitting: Cardiology

## 2019-07-24 ENCOUNTER — Encounter: Payer: Self-pay | Admitting: Cardiology

## 2019-07-24 ENCOUNTER — Telehealth: Payer: Self-pay | Admitting: *Deleted

## 2019-07-24 ENCOUNTER — Other Ambulatory Visit: Payer: Self-pay

## 2019-07-24 VITALS — BP 109/65 | HR 51 | Ht 75.0 in | Wt 362.0 lb

## 2019-07-24 DIAGNOSIS — G4733 Obstructive sleep apnea (adult) (pediatric): Secondary | ICD-10-CM | POA: Diagnosis not present

## 2019-07-24 DIAGNOSIS — I10 Essential (primary) hypertension: Secondary | ICD-10-CM | POA: Diagnosis not present

## 2019-07-24 NOTE — Telephone Encounter (Signed)
Titration sent to sleep pool.

## 2019-07-24 NOTE — Telephone Encounter (Signed)
-----   Message from [name], MD sent at 07/24/2019 10:18 AM EST ----- Patient is not tolerating his PAP. Please set him up for inlab CPAP titration with a full face mask  He will need followup with me about 3 weeks after his study  Joe [name]

## 2019-07-24 NOTE — Telephone Encounter (Signed)
Faxed PA to [employer] for CPAP titration . [phone].

## 2019-07-25 ENCOUNTER — Telehealth: Payer: Self-pay | Admitting: Nurse Practitioner

## 2019-07-25 ENCOUNTER — Other Ambulatory Visit: Payer: Self-pay | Admitting: Nurse Practitioner

## 2019-07-25 DIAGNOSIS — U071 COVID-19: Secondary | ICD-10-CM

## 2019-07-25 DIAGNOSIS — I5022 Chronic systolic (congestive) heart failure: Secondary | ICD-10-CM

## 2019-07-25 DIAGNOSIS — I255 Ischemic cardiomyopathy: Secondary | ICD-10-CM

## 2019-07-25 DIAGNOSIS — E118 Type 2 diabetes mellitus with unspecified complications: Secondary | ICD-10-CM

## 2019-07-25 NOTE — Telephone Encounter (Signed)
Called to discuss with Joe [name] about Covid symptoms and the use of bamlanivimab, a monoclonal antibody infusion for those with mild to moderate Covid symptoms and at a high risk of hospitalization.     Pt is qualified for this infusion at the [facility] infusion center due to co-morbid conditions and/or a member of an at-risk group.   Symptoms tier reviewed as well as criteria for ending isolation.  Symptoms reviewed that would warrant ED/Hospital evaluation. Preventative practices reviewed. Patient verbalized understanding. Patient is requesting to receive infusion   At his request patient has been scheduled for 07/26/19 @ 0830. He verbalized understanding of treatment and appointment details.   MyChart message also sent with appointment details as requested.     Patient Active Problem List   Diagnosis Date Noted  . 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/02/2019  . Chronic systolic (congestive) heart failure ([place]) 03/19/2019  . Coronary artery disease   . Pre-operative clearance 02/11/2018  . CAD S/P percutaneous coronary angioplasty 09/07/2017  . Ischemic cardiomyopathy 09/07/2017  . Acute systolic heart failure ([place]) 09/06/2017  . Angina pectoris ([place]) 09/05/2017  . Shortness of breath 09/05/2017  . Type 2 diabetes mellitus with complication, without long-term current use of insulin ([place]) 09/05/2017  . Obstructive sleep apnea   . GERD (gastroesophageal reflux disease)   . Erectile dysfunction   . Essential hypertension, benign   . Obesity   . Testosterone deficiency   . Gout   . Abnormal EKG   . Right bundle branch block     Alda Lea, AGPCNP-BC Pager: [phone] Amion: N. Cousar

## 2019-07-25 NOTE — Progress Notes (Signed)
  I connected by phone with Joe [name] on 07/25/2019 at 10:47 AM to discuss the potential use of an new treatment for mild to moderate COVID-19 viral infection in non-hospitalized patients.  This patient is a 49 y.o. male that meets the FDA criteria for Emergency Use Authorization of bamlanivimab or casirivimab\imdevimab.  Has a (+) direct SARS-CoV-2 viral test result  Has mild or moderate COVID-19   Is ? 49 years of age and weighs ? 40 kg  Is NOT hospitalized due to COVID-19  Is NOT requiring oxygen therapy or requiring an increase in baseline oxygen flow rate due to COVID-19  Is within 10 days of symptom onset  Has at least one of the high risk factor(s) for progression to severe COVID-19 and/or hospitalization as defined in EUA.  Specific high risk criteria : Cardiovascular Disease   I have spoken and communicated the following to the patient or parent/caregiver:  1. FDA has authorized the emergency use of bamlanivimab and casirivimab\imdevimab for the treatment of mild to moderate COVID-19 in adults and pediatric patients with positive results of direct SARS-CoV-2 viral testing who are 79 years of age and older weighing at least 40 kg, and who are at high risk for progressing to severe COVID-19 and/or hospitalization.  2. The significant known and potential risks and benefits of bamlanivimab and casirivimab\imdevimab, and the extent to which such potential risks and benefits are unknown.  3. Information on available alternative treatments and the risks and benefits of those alternatives, including clinical trials.  4. Patients treated with bamlanivimab and casirivimab\imdevimab should continue to self-isolate and use infection control measures (e.g., wear mask, isolate, social distance, avoid sharing personal items, clean and disinfect "high touch" surfaces, and frequent handwashing) according to CDC guidelines.   5. The patient or parent/caregiver has the option to accept or  refuse bamlanivimab or casirivimab\imdevimab .  After reviewing this information with the patient, The patient agreed to proceed with receiving the bamlanimivab infusion and will be provided a copy of the Fact sheet prior to receiving the infusion.   Nikki Pickenpack-Cousar 07/25/2019 10:47 AM

## 2019-07-25 NOTE — Telephone Encounter (Signed)
Called to discuss with [name] about Covid symptoms and the use of bamlanivimab, a monoclonal antibody infusion for those with mild to moderate Covid symptoms and at a high risk of hospitalization.     Pt is qualified for this infusion at the [facility] infusion center due to co-morbid conditions and/or a member of an at-risk group.   Referral received via Covid Infusion hotline.   Unable to reach patient. HIPPA appropriate voicemail left. MyChart message also sent.   Patient Active Problem List   Diagnosis Date Noted  . 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/02/2019  . Chronic systolic (congestive) heart failure ([place]) 03/19/2019  . Coronary artery disease   . Pre-operative clearance 02/11/2018  . CAD S/P percutaneous coronary angioplasty 09/07/2017  . Ischemic cardiomyopathy 09/07/2017  . Acute systolic heart failure ([place]) 09/06/2017  . Angina pectoris ([place]) 09/05/2017  . Shortness of breath 09/05/2017  . Type 2 diabetes mellitus with complication, without long-term current use of insulin ([place]) 09/05/2017  . Obstructive sleep apnea   . GERD (gastroesophageal reflux disease)   . Erectile dysfunction   . Essential hypertension, benign   . Obesity   . Testosterone deficiency   . Gout   . Abnormal EKG   . Right bundle branch block     Alda Lea, AGPCNP-BC Pager: [phone] Amion: N. Cousar

## 2019-07-26 ENCOUNTER — Ambulatory Visit (HOSPITAL_COMMUNITY)
Admission: RE | Admit: 2019-07-26 | Discharge: 2019-07-26 | Disposition: A | Discharge status: Home / Self Care | Payer: BC Managed Care – PPO | Source: Ambulatory Visit | Attending: Pulmonary Disease | Admitting: Pulmonary Disease

## 2019-07-26 DIAGNOSIS — U071 COVID-19: Secondary | ICD-10-CM | POA: Diagnosis not present

## 2019-07-26 DIAGNOSIS — I255 Ischemic cardiomyopathy: Secondary | ICD-10-CM

## 2019-07-26 DIAGNOSIS — E118 Type 2 diabetes mellitus with unspecified complications: Secondary | ICD-10-CM | POA: Diagnosis not present

## 2019-07-26 DIAGNOSIS — I5022 Chronic systolic (congestive) heart failure: Secondary | ICD-10-CM | POA: Diagnosis not present

## 2019-07-26 MED ORDER — METHYLPREDNISOLONE SODIUM SUCC 125 MG IJ SOLR: 125.0000 mg | Freq: Once | INTRAMUSCULAR | Status: DC | PRN

## 2019-07-26 MED ORDER — SODIUM CHLORIDE 0.9 % IV SOLN
INTRAVENOUS | Status: DC | PRN
  Administered 2019-07-26: 250 mL via INTRAVENOUS

## 2019-07-26 MED ORDER — FAMOTIDINE IN NACL 20-0.9 MG/50ML-% IV SOLN: 20.0000 mg | Freq: Once | INTRAVENOUS | Status: DC | PRN

## 2019-07-26 MED ORDER — DIPHENHYDRAMINE HCL 50 MG/ML IJ SOLN: 50.0000 mg | Freq: Once | INTRAMUSCULAR | Status: DC | PRN

## 2019-07-26 MED ORDER — SODIUM CHLORIDE 0.9 % IV SOLN
700.0000 mg | Freq: Once | INTRAVENOUS | Status: AC
  Administered 2019-07-26: 700 mg via INTRAVENOUS
  Filled 2019-07-26: qty 20

## 2019-07-26 MED ORDER — EPINEPHRINE 0.3 MG/0.3ML IJ SOAJ: 0.3000 mg | Freq: Once | INTRAMUSCULAR | Status: DC | PRN

## 2019-07-26 MED ORDER — ALBUTEROL SULFATE HFA 108 (90 BASE) MCG/ACT IN AERS: 2.0000 | INHALATION_SPRAY | Freq: Once | RESPIRATORY_TRACT | Status: DC | PRN

## 2019-07-26 NOTE — Discharge Instructions (Signed)
10 Things You Can Do to Manage Your COVID-19 Symptoms at Home If you have possible or confirmed COVID-19: 1. Stay home from work and school. And stay away from other public places. If you must go out, avoid using any kind of public transportation, ridesharing, or taxis. 2. Monitor your symptoms carefully. If your symptoms get worse, call your healthcare provider immediately. 3. Get rest and stay hydrated. 4. If you have a medical appointment, call the healthcare provider ahead of time and tell them that you have or may have COVID-19. 5. For medical emergencies, call 911 and notify the dispatch personnel that you have or may have COVID-19. 6. Cover your cough and sneezes with a tissue or use the inside of your elbow. 7. Wash your hands often with soap and water for at least 20 seconds or clean your hands with an alcohol-based hand sanitizer that contains at least 60% alcohol. 8. As much as possible, stay in a specific room and away from other people in your home. Also, you should use a separate bathroom, if available. If you need to be around other people in or outside of the home, wear a mask. 9. Avoid sharing personal items with other people in your household, like dishes, towels, and bedding. 10. Clean all surfaces that are touched often, like counters, tabletops, and doorknobs. Use household cleaning sprays or wipes according to the label instructions. [email or web address] 12/18/2018 This information is not intended to replace advice given to you by your health care provider. Make sure you discuss any questions you have with your health care provider. Document Revised: 05/22/2019 Document Reviewed: 05/22/2019 Elsevier Patient Education  [place]. Before What types of side effects do monoclonal antibody drugs cause?  Common side effects  In general, the more common side effects caused by monoclonal antibody drugs include: . Allergic reactions, such as hives or itching . Flu-like  signs and symptoms, including chills, fatigue, fever, and muscle aches and pains . Nausea, vomiting . Diarrhea . Skin rashes . Low blood pressure   The CDC is recommending patients who receive monoclonal antibody treatments wait at least 90 days before being vaccinated.  Currently, there are no data on the safety and efficacy of mRNA COVID-19 vaccines in persons who received monoclonal antibodies or convalescent plasma as part of COVID-19 treatment. Based on the estimated half-life of such therapies as well as evidence suggesting that reinfection is uncommon in the 90 days after initial infection, vaccination should be deferred for at least 90 days, as a precautionary measure until additional information becomes available, to avoid interference of the antibody treatment with vaccine-induced immune responses.

## 2019-07-26 NOTE — Progress Notes (Signed)
  Diagnosis: COVID-19  Physician: Dr. Joya Gaskins  Procedure: Covid Infusion Clinic Med: bamlanivimab infusion - Provided patient with bamlanimivab fact sheet for patients, parents and caregivers prior to infusion.  Complications: No immediate complications noted.  Discharge: Discharged home   Joe [name], Joe [name] 07/26/2019

## 2019-07-31 ENCOUNTER — Other Ambulatory Visit: Payer: Self-pay | Admitting: Cardiology

## 2019-08-04 ENCOUNTER — Telehealth: Payer: Self-pay | Admitting: *Deleted

## 2019-08-04 NOTE — Telephone Encounter (Signed)
Staff message sent to Gae Bon ok to schedule CPAP titration. Per patient's insurance [employer] no PA is required. Call reference # [id number].

## 2019-08-04 NOTE — Telephone Encounter (Signed)
-----   Message from Freada Bergeron, [place] sent at 07/24/2019  3:26 PM EST ----- Regarding: precert Cpap titration ----- Message ----- From: [name], MD Sent: 07/24/2019  10:18 AM EST To: Freada Bergeron, CMA  Patient is not tolerating his PAP. Please set him up for inlab CPAP titration with a full face mask  He will need followup with me about 3 weeks after his study  Traci

## 2019-08-08 ENCOUNTER — Other Ambulatory Visit: Payer: Self-pay | Admitting: Cardiology

## 2019-08-11 ENCOUNTER — Telehealth: Payer: Self-pay | Admitting: *Deleted

## 2019-08-11 NOTE — Telephone Encounter (Signed)
Patient is scheduled for CPAP Titration on 08/24/19. PT is scheduled for COVID screening on 08/22/19 11:40 prior to TITRATION. Patient understands his titration study will be done at [facility] sleep lab. Patient understands he will receive a letter in a week or so detailing appointment, date, time, and location. Patient understands to call if he does not receive the letter  in a timely manner. Left detailed message on voicemail with date and time of titration and informed patient to call back to confirm or reschedule.

## 2019-08-11 NOTE — Telephone Encounter (Signed)
-----   Message from Lauralee Evener, [place] sent at 08/04/2019 11:12 AM EST ----- Regarding: RE: precert Per patient's insurance no PA is required. Ok to schedule. Call reference # [id number]. ----- Message ----- From: Freada Bergeron, CMA Sent: 07/24/2019   3:26 PM EST To: Cv Div Sleep Studies Subject: precert                                        Cpap titration ----- Message ----- From: [name], MD Sent: 07/24/2019  10:18 AM EST To: Freada Bergeron, CMA  Patient is not tolerating his PAP. Please set him up for inlab CPAP titration with a full face mask  He will need followup with me about 3 weeks after his study  Traci

## 2019-08-22 ENCOUNTER — Other Ambulatory Visit (HOSPITAL_COMMUNITY): Payer: BC Managed Care – PPO | Attending: Cardiology

## 2019-08-24 ENCOUNTER — Other Ambulatory Visit: Payer: Self-pay

## 2019-08-24 ENCOUNTER — Ambulatory Visit (HOSPITAL_BASED_OUTPATIENT_CLINIC_OR_DEPARTMENT_OTHER): Payer: BC Managed Care – PPO | Attending: Cardiology | Admitting: Cardiology

## 2019-08-24 DIAGNOSIS — G4733 Obstructive sleep apnea (adult) (pediatric): Secondary | ICD-10-CM | POA: Insufficient documentation

## 2019-08-28 NOTE — Procedures (Signed)
     Patient Name: Joe [name], [name] Date: 08/24/2019 Gender: Male D.O.B: [birth year] Age (years): 7 Referring Provider: Fransico Him MD, ABSM Height (inches): 75 Interpreting Physician: Fransico Him MD, ABSM Weight (lbs): 365 RPSGT: Gwenyth Allegra BMI: 46 MRN: [id number] Neck Size: 21.00  CLINICAL INFORMATION The patient is referred for a CPAP titration to treat sleep apnea.  SLEEP STUDY TECHNIQUE As per the AASM Manual for the Scoring of Sleep and Associated Events v2.3 (April 2016) with a hypopnea requiring 4% desaturations.  The channels recorded and monitored were frontal, central and occipital EEG, electrooculogram (EOG), submentalis EMG (chin), nasal and oral airflow, thoracic and abdominal wall motion, anterior tibialis EMG, snore microphone, electrocardiogram, and pulse oximetry. Continuous positive airway pressure (CPAP) was initiated at the beginning of the study and titrated to treat sleep-disordered breathing.  MEDICATIONS Medications self-administered by patient taken the night of the study : N/A  TECHNICIAN COMMENTS Comments added by technician: None Comments added by scorer: N/A  RESPIRATORY PARAMETERS Optimal PAP Pressure (cm): 18  AHI at Optimal Pressure (/hr):0.0 Overall Minimal O2 (%):86.0  Supine % at Optimal Pressure (%):0 Minimal O2 at Optimal Pressure (%): 90.0   SLEEP ARCHITECTURE The study was initiated at 10:13:35 PM and ended at 4:24:13 AM.  Sleep onset time was 2.3 minutes and the sleep efficiency was 74.3%. The total sleep time was 275.5 minutes.  The patient spent 6.9% of the night in stage N1 sleep, 74.2% in stage N2 sleep, 0.0% in stage N3 and 18.9% in REM.Stage REM latency was 176.5 minutes  Wake after sleep onset was 92.8. Alpha intrusion was absent. Supine sleep was 28.68%.  CARDIAC DATA The 2 lead EKG demonstrated NSR. The mean heart rate was 77.7 beats per minute. Other EKG findings include: Multifocal PVCs, ventricular  couplets, salvos and 4 beat runs of wide complex tachycardia.  LEG MOVEMENT DATA The total Periodic Limb Movements of Sleep (PLMS) were 0. The PLMS index was 0.0. A PLMS index of <15 is considered normal in adults.  IMPRESSIONS - The optimal PAP pressure was 18 cm of water. - Central sleep apnea was not noted during this titration (CAI = 0.0/h). - Moderate oxygen desaturations were observed during this titration (min O2 = 86.0%). - The patient snored with soft snoring volume during this titration study. - Multifocal PVCs, ventricular couplets, salvos and 4 beat runs of wide complex tachycardia. were observed during this study. - Clinically significant periodic limb movements were not noted during this study. Arousals associated with PLMs were rare.  DIAGNOSIS - Obstructive Sleep Apnea (327.23 [G47.33 ICD-10])  RECOMMENDATIONS - Trial of CPAP therapy on 18 cm H2O with a Large size Fisher&Paykel Full Face Mask Simplus mask and heated humidification. - Avoid alcohol, sedatives and other CNS depressants that may worsen sleep apnea and disrupt normal sleep architecture. - Sleep hygiene should be reviewed to assess factors that may improve sleep quality. - Weight management and regular exercise should be initiated or continued. - Return to Sleep Center for re-evaluation after 8 weeks of therapy  [Electronically signed] 08/28/2019 09:33 PM  Fransico Him MD, ABSM Diplomate, American Board of Sleep Medicine

## 2019-09-01 ENCOUNTER — Telehealth: Payer: Self-pay | Admitting: *Deleted

## 2019-09-01 NOTE — Telephone Encounter (Signed)
-----   Message from [name], MD sent at 08/28/2019  9:39 PM EST ----- Please let patient know that they had a successful PAP titration and let DME know that orders are in EPIC.  Please set up 10 week OV with me.

## 2019-09-01 NOTE — Telephone Encounter (Signed)
Informed patient of titration results and verbalized understanding was indicated. Patient understands his sleep titration showed they had a successful PAP titration and let DME know that orders are in EPIC. Please set up 10 week OV with me.   Upon patient request DME selection is [facility]. Patient understands he will be contacted by [facility] to set up his cpap. Patient understands to call if [facility] does not contact him with new setup in a timely manner. Patient understands they will be called once confirmation has been received from [facility] that they have received their new machine to schedule 10 week follow up appointment.  [facility] notified of new cpap order  Please add to airview Patient was grateful for the call and thanked me.

## 2019-09-04 NOTE — Telephone Encounter (Signed)
Yes 3 weeks after getting device

## 2019-09-04 NOTE — Telephone Encounter (Signed)
Patient completed his titration on 08/24/19 and is awaiting to be set up for cpap, do you still want to see him in 3 weeks?

## 2019-09-07 ENCOUNTER — Other Ambulatory Visit: Payer: Self-pay | Admitting: Cardiology

## 2019-09-12 ENCOUNTER — Encounter: Payer: Self-pay | Admitting: Cardiology

## 2019-09-12 ENCOUNTER — Ambulatory Visit: Payer: BC Managed Care – PPO | Admitting: Cardiology

## 2019-09-12 ENCOUNTER — Other Ambulatory Visit: Payer: Self-pay

## 2019-09-12 VITALS — BP 102/62 | HR 68 | Temp 97.5°F | Ht 75.0 in | Wt 373.5 lb

## 2019-09-12 DIAGNOSIS — I4510 Unspecified right bundle-branch block: Secondary | ICD-10-CM | POA: Diagnosis not present

## 2019-09-12 DIAGNOSIS — I2510 Atherosclerotic heart disease of native coronary artery without angina pectoris: Secondary | ICD-10-CM

## 2019-09-12 DIAGNOSIS — I10 Essential (primary) hypertension: Secondary | ICD-10-CM

## 2019-09-12 DIAGNOSIS — I255 Ischemic cardiomyopathy: Secondary | ICD-10-CM

## 2019-09-12 DIAGNOSIS — R0602 Shortness of breath: Secondary | ICD-10-CM

## 2019-09-12 DIAGNOSIS — I5022 Chronic systolic (congestive) heart failure: Secondary | ICD-10-CM | POA: Diagnosis not present

## 2019-09-12 DIAGNOSIS — Z95810 Presence of automatic (implantable) cardiac defibrillator: Secondary | ICD-10-CM

## 2019-09-12 DIAGNOSIS — I5021 Acute systolic (congestive) heart failure: Secondary | ICD-10-CM | POA: Diagnosis not present

## 2019-09-12 NOTE — Patient Instructions (Signed)
Medication Instructions:  Your physician recommends that you continue on your current medications as directed. Please refer to the Current Medication list given to you today.  *If you need a refill on your cardiac medications before your next appointment, please call your pharmacy*   Lab Work: Your physician recommends that you return for lab work today: bmp   If you have labs (blood work) drawn today and your tests are completely normal, you will receive your results only by: MyChart Message (if you have MyChart) OR A paper copy in the mail If you have any lab test that is abnormal or we need to change your treatment, we will call you to review the results.   Testing/Procedures: None   Follow-Up: At [facility], you and your health needs are our priority.  As part of our continuing mission to provide you with exceptional heart care, we have created designated Provider Care Teams.  These Care Teams include your primary Cardiologist (physician) and Advanced Practice Providers (APPs -  Physician Assistants and Nurse Practitioners) who all work together to provide you with the care you need, when you need it.  We recommend signing up for the patient portal called "MyChart".  Sign up information is provided on this After Visit Summary.  MyChart is used to connect with patients for Virtual Visits (Telemedicine).  Patients are able to view lab/test results, encounter notes, upcoming appointments, etc.  Non-urgent messages can be sent to your provider as well.   To learn more about what you can do with MyChart, go to [email or web address].    Your next appointment:   3 month(s)  The format for your next appointment:   In Person  Provider:   Robert Krasowski, MD    Other Instructions    

## 2019-09-12 NOTE — Progress Notes (Signed)
Cardiology Office Note:    Date:  09/12/2019   ID:  Joe [name], DOB [birth year], MRN [id number]  PCP:  [name], MD  Cardiologist:  Jenne Campus, MD    Referring MD: Cyndi Bender, PA-C   No chief complaint on file. Doing fine  History of Present Illness:    Joe [name] is a 49 y.o. male He did have a stent to mid and distal LAD in March 2019 after that he recovered quite nicely did very well however gradually started deteriorating. Eventually end up having another cardiac catheterization in January of this year which showed 40% distal LAD, stents were patent, mid RCA got 25% lesion right PDA had 50% stenosis the circumflex artery were open. No intervention has been required. Since that time he gradually deteriorated. His last echocardiogram showed ejection fraction 20 to 25% in spite of optimal medical therapy which include Entresto Aldactone as well as Coreg. He is symptomatic he is New York Heart Association class is 3. Eventually on 19 March 2019 he received CRT[facility]D by [facility].,  Since that time he gradually getting better. About a month ago he also was sick with COVID-19 infection.  It looks like he received antibiotic with good response.  And recovered quite nicely. He comes today to my for follow-up of all doing well he said he got good days bad days.  But overall seems to be functioning quite well.  Overall after implantation of dual-chamber CRT feeling much better.  Denies having any swelling of lower extremities no palpitations.  Past Medical History:  Diagnosis Date  . Abnormal EKG   . Body mass index 45.0-49.9, adult ([place])   . CHF (congestive heart failure) ([place])   . Complication of anesthesia 1995   "had hard time waking up; breathing w/vasectomy"  . Coronary artery disease   . Erectile dysfunction   . Essential hypertension, benign   . Fatigue   . GERD (gastroesophageal reflux disease)   . High cholesterol    "just started tx yesterday"  (09/06/2017)  . History of gout    "RX prn" (09/06/2017)  . Muscular chest pain   . Obesity   . OSA on CPAP   . Plantar fasciitis   . Right bundle branch block   . Testosterone deficiency   . Type II diabetes mellitus ([place])    "started tx 08/28/2017"    Past Surgical History:  Procedure Laterality Date  . BACK SURGERY    . BIV ICD INSERTION CRT-D N/A 03/19/2019   Procedure: BIV ICD INSERTION CRT-D;  Surgeon: Constance Haw, MD;  Location: [facility];  Service: Cardiovascular;  Laterality: N/A;  . CORONARY ANGIOPLASTY WITH STENT PLACEMENT  09/06/2017   "3 stents"  . CORONARY STENT INTERVENTION N/A 09/06/2017   Procedure: CORONARY STENT INTERVENTION;  Surgeon: Jettie Booze, MD;  Location: [facility];  Service: Cardiovascular;  Laterality: N/A;  . CORONARY STENT INTERVENTION N/A 02/15/2018   Procedure: CORONARY STENT INTERVENTION;  Surgeon: Jettie Booze, MD;  Location: [facility];  Service: Cardiovascular;  Laterality: N/A;  . KNEE ARTHROSCOPY Right   . LEFT HEART CATH AND CORONARY ANGIOGRAPHY N/A 09/06/2017   Procedure: LEFT HEART CATH AND CORONARY ANGIOGRAPHY;  Surgeon: Jettie Booze, MD;  Location: [facility];  Service: Cardiovascular;  Laterality: N/A;  . LEFT HEART CATH AND CORONARY ANGIOGRAPHY N/A 02/15/2018   Procedure: LEFT HEART CATH AND CORONARY ANGIOGRAPHY;  Surgeon: Jettie Booze, MD;  Location: [facility];  Service: Cardiovascular;  Laterality: N/A;  . LEFT HEART CATH AND CORONARY ANGIOGRAPHY N/A 06/27/2018   Procedure: LEFT HEART CATH AND CORONARY ANGIOGRAPHY;  Surgeon: Jettie Booze, MD;  Location: [facility];  Service: Cardiovascular;  Laterality: N/A;  . LUMBAR SPINE SURGERY  ~ 2001   Intradiscal Electrothermoplasty  . VASECTOMY  1995    Current Medications: Current Meds  Medication Sig  . acetaminophen (TYLENOL) 500 MG tablet Take 1,000 mg by mouth every 6 (six) hours as needed for  moderate pain or headache.  . allopurinol (ZYLOPRIM) 100 MG tablet Take 100 mg by mouth daily as needed (for gout).   Marland Kitchen aspirin EC 81 MG tablet Take 1 tablet (81 mg total) by mouth daily.  . Blood Glucose Monitoring Suppl (ONE TOUCH ULTRA 2) w/Device KIT 1 Device by Other route 2 (two) times daily.  . carvedilol (COREG) 12.5 MG tablet TAKE 1 TABLET BY MOUTH TWICE A DAY  . clopidogrel (PLAVIX) 75 MG tablet TAKE 1 TABLET (75 MG TOTAL) BY MOUTH AT BEDTIME.  . dapagliflozin propanediol (FARXIGA) 10 MG TABS tablet Take 10 mg by mouth daily before breakfast.  . digoxin (LANOXIN) 0.125 MG tablet Take 1 tablet (0.125 mg total) by mouth daily.  Marland Kitchen ENTRESTO 97-103 MG TAKE 1 TABLET BY MOUTH TWICE A DAY  . furosemide (LASIX) 40 MG tablet TAKE 2 TABLETS BY MOUTH EVERY DAY  . ibuprofen (ADVIL) 200 MG tablet Take 800 mg by mouth every 8 (eight) hours as needed for headache or moderate pain.  . indomethacin (INDOCIN) 50 MG capsule TAKE 1 CAPSULE 3 TIMES A DAY AS NEEDED FOR GOUT FLARE SYMPTOMS  . metFORMIN (GLUCOPHAGE-XR) 500 MG 24 hr tablet Take 500 mg by mouth in the morning, at noon, in the evening, and at bedtime. 2 tablets in the morning and 2 tablets at night  . nitroGLYCERIN (NITROSTAT) 0.4 MG SL tablet Place 1 tablet (0.4 mg total) under the tongue every 5 (five) minutes as needed for chest pain.  Marland Kitchen omeprazole (PRILOSEC) 40 MG capsule Take 1 capsule (40 mg total) by mouth daily.  . ondansetron (ZOFRAN ODT) 4 MG disintegrating tablet '4mg'$  ODT q4 hours prn nausea/vomit  . ONETOUCH ULTRA test strip 1 each by Other route 2 (two) times daily.  . Polyethylene Glycol 400 (BLINK TEARS OP) Place 2 drops into both eyes 2 (two) times daily as needed (for dry eyes).  . Potassium Chloride ER 20 MEQ TBCR TAKE 1 TABLET BY MOUTH EVERY DAY  . ranolazine (RANEXA) 500 MG 12 hr tablet TAKE 1 TABLET BY MOUTH TWICE A DAY  . rosuvastatin (CRESTOR) 40 MG tablet TAKE 1 TABLET BY MOUTH EVERY DAY IN THE EVENING  . spironolactone  (ALDACTONE) 25 MG tablet TAKE 0.5 TABLETS BY MOUTH EVERY DAY     Allergies:   Patient has no known allergies.   Social History   Socioeconomic History  . Marital status: Married    Spouse name: Not on file  . Number of children: Not on file  . Years of education: Not on file  . Highest education level: Not on file  Occupational History  . Not on file  Tobacco Use  . Smoking status: Never Smoker  . Smokeless tobacco: Never Used  Substance and Sexual Activity  . Alcohol use: No  . Drug use: Never  . Sexual activity: Not on file  Other Topics Concern  . Not on file  Social History Narrative  .  Not on file   Social Determinants of Health   Financial Resource Strain:   . Difficulty of Paying Living Expenses:   Food Insecurity:   . Worried About Charity fundraiser in the Last Year:   . Arboriculturist in the Last Year:   Transportation Needs:   . Film/video editor (Medical):   Marland Kitchen Lack of Transportation (Non-Medical):   Physical Activity:   . Days of Exercise per Week:   . Minutes of Exercise per Session:   Stress:   . Feeling of Stress :   Social Connections:   . Frequency of Communication with Friends and Family:   . Frequency of Social Gatherings with Friends and Family:   . Attends Religious Services:   . Active Member of Clubs or Organizations:   . Attends Archivist Meetings:   Marland Kitchen Marital Status:      Family History: The patient's family history includes Brain cancer in his mother; Diabetes Mellitus II in his brother and paternal grandfather; Lung cancer in his mother; Lymphoma in his father. ROS:   Please see the history of present illness.    All 14 point review of systems negative except as described per history of present illness  EKGs/Labs/Other Studies Reviewed:      Recent Labs: 12/20/2018: ALT 23 03/03/2019: Hemoglobin 13.3; Platelets 222 03/26/2019: B Natriuretic Peptide 52.0; BUN 14; Creatinine, Ser 1.22; Potassium 4.4; Sodium 135    Recent Lipid Panel    Component Value Date/Time   CHOL 124 03/27/2018 0940   TRIG 218 (H) 03/27/2018 0940   HDL 35 (L) 03/27/2018 0940   CHOLHDL 3.5 03/27/2018 0940   LDLCALC 45 03/27/2018 0940    Physical Exam:    VS:  BP 102/62   Pulse 68   Temp (!) 97.5 F (36.4 C) (Temporal)   Ht '6\' 3"'$  (1.905 m)   Wt (!) 373 lb 8 oz (169.4 kg)   SpO2 95%   BMI 46.68 kg/m     Wt Readings from Last 3 Encounters:  09/12/19 (!) 373 lb 8 oz (169.4 kg)  08/24/19 (!) 365 lb (165.6 kg)  07/24/19 (!) 362 lb (164.2 kg)     GEN:  Well nourished, well developed in no acute distress HEENT: Normal NECK: No JVD; No carotid bruits LYMPHATICS: No lymphadenopathy CARDIAC: RRR, no murmurs, no rubs, no gallops RESPIRATORY:  Clear to auscultation without rales, wheezing or rhonchi  ABDOMEN: Soft, non-tender, non-distended MUSCULOSKELETAL:  No edema; No deformity  SKIN: Warm and dry LOWER EXTREMITIES: no swelling NEUROLOGIC:  Alert and oriented x 3 PSYCHIATRIC:  Normal affect   ASSESSMENT:    1. Essential hypertension, benign   2. Right bundle branch block   3. Acute systolic heart failure ([place])   4. Chronic systolic (congestive) heart failure ([facility])   5. Coronary artery disease involving native coronary artery of native heart without angina pectoris   6. Ischemic cardiomyopathy   7. Shortness of breath   8. Presence of cardiac resynchronization therapy defibrillator (CRT-D)    PLAN:    In order of problems listed above:  1. Congestive heart failure.  New York Heart Association class II.  On appropriate guideline directed medical therapy at maximal dosages.  I will continue. 2. Synchronization therapy: He is interrogation reviewed.  Normal battery status, normal leads parameters, however BiV pacing only done less than 90% within last 24 hours.  Overall only 91.  The decision has been made to increase dose of Coreg.  The reason for diminishing pacing is probably frequent ventricular ectopy.   That being addressed by EP team. 3. Essential hypertension blood pressure well controlled continue present management. 4. Dyslipidemia: We will make arrangements for fasting lipid profile.  In few months.  Last fasting lipid profile I have is from December 29 showing LDL of 63 and HDL 32. 5. Diabetes stable 6. Status post COVID-19 infection.  He recovered quite nicely.   Medication Adjustments/Labs and Tests Ordered: Current medicines are reviewed at length with the patient today.  Concerns regarding medicines are outlined above.  Orders Placed This Encounter  Procedures  . Basic metabolic panel  . EKG 12-Lead   Medication changes: No orders of the defined types were placed in this encounter.   Signed, Park Liter, MD, [facility] 09/12/2019 4:44 PM    [place]

## 2019-09-13 LAB — BASIC METABOLIC PANEL
BUN/Creatinine Ratio: 9 (ref 9–20)
BUN: 10 mg/dL (ref 6–24)
CO2: 21 mmol/L (ref 20–29)
Calcium: 8.7 mg/dL (ref 8.7–10.2)
Chloride: 105 mmol/L (ref 96–106)
Creatinine, Ser: 1.08 mg/dL (ref 0.76–1.27)
GFR calc Af Amer: 93 mL/min/{1.73_m2} (ref >59)
GFR calc non Af Amer: 81 mL/min/{1.73_m2} (ref >59)
Glucose: 184 mg/dL — ABNORMAL HIGH (ref 65–99)
Potassium: 4.1 mmol/L (ref 3.5–5.2)
Sodium: 140 mmol/L (ref 134–144)

## 2019-09-22 ENCOUNTER — Ambulatory Visit (INDEPENDENT_AMBULATORY_CARE_PROVIDER_SITE_OTHER): Payer: BC Managed Care – PPO | Admitting: Cardiology

## 2019-09-22 ENCOUNTER — Encounter: Payer: Self-pay | Admitting: Cardiology

## 2019-09-22 ENCOUNTER — Other Ambulatory Visit: Payer: Self-pay

## 2019-09-22 VITALS — BP 120/72 | HR 89 | Ht 75.0 in | Wt 366.0 lb

## 2019-09-22 DIAGNOSIS — I255 Ischemic cardiomyopathy: Secondary | ICD-10-CM | POA: Diagnosis not present

## 2019-09-22 DIAGNOSIS — Z95810 Presence of automatic (implantable) cardiac defibrillator: Secondary | ICD-10-CM

## 2019-09-22 LAB — CUP PACEART INCLINIC DEVICE CHECK
Battery Remaining Longevity: 72 mo
Brady Statistic RA Percent Paced: 0.17 %
Brady Statistic RV Percent Paced: 88 %
Date Time Interrogation Session: 20210405173739
HighPow Impedance: 78.75 Ohm
Implantable Lead Implant Date: 20200930
Implantable Lead Implant Date: 20200930
Implantable Lead Implant Date: 20200930
Implantable Lead Location: 753858
Implantable Lead Location: 753859
Implantable Lead Location: 753860
Implantable Pulse Generator Implant Date: 20200930
Lead Channel Impedance Value: 1025 Ohm
Lead Channel Impedance Value: 450 Ohm
Lead Channel Impedance Value: 512.5 Ohm
Lead Channel Pacing Threshold Amplitude: 0.75 V
Lead Channel Pacing Threshold Amplitude: 0.75 V
Lead Channel Pacing Threshold Amplitude: 1.5 V
Lead Channel Pacing Threshold Pulse Width: 0.5 ms
Lead Channel Pacing Threshold Pulse Width: 0.5 ms
Lead Channel Pacing Threshold Pulse Width: 0.5 ms
Lead Channel Sensing Intrinsic Amplitude: 12 mV
Lead Channel Sensing Intrinsic Amplitude: 2.3 mV
Lead Channel Setting Pacing Amplitude: 2 V
Lead Channel Setting Pacing Amplitude: 2.5 V
Lead Channel Setting Pacing Amplitude: 2.5 V
Lead Channel Setting Pacing Pulse Width: 0.5 ms
Lead Channel Setting Pacing Pulse Width: 0.5 ms
Lead Channel Setting Sensing Sensitivity: 0.5 mV
Pulse Gen Serial Number: 9901124

## 2019-09-22 MED ORDER — MEXILETINE HCL 200 MG PO CAPS: 200.0000 mg | ORAL_CAPSULE | Freq: Two times a day (BID) | ORAL | 6 refills | Status: DC

## 2019-09-22 NOTE — Patient Instructions (Addendum)
Medication Instructions:  Your physician has recommended you make the following change in your medication: 1. START Mexiletine 200 mg twice daily  *If you need a refill on your cardiac medications before your next appointment, please call your pharmacy*   Lab Work: None ordered   Testing/Procedures: None ordered   Follow-Up: Remote monitoring is used to monitor your Pacemaker of ICD from home. This monitoring reduces the number of office visits required to check your device to one time per year. It allows [place] to keep an eye on the functioning of your device to ensure it is working properly. You are scheduled for a device check from home on 10/20/2019. You may send your transmission at any time that day. If you have a wireless device, the transmission will be sent automatically. After your physician reviews your transmission, you will receive a postcard with your next transmission date.  At [facility], you and your health needs are our priority.  As part of our continuing mission to provide you with exceptional heart care, we have created designated Provider Care Teams.  These Care Teams include your primary Cardiologist (physician) and Advanced Practice Providers (APPs -  Physician Assistants and Nurse Practitioners) who all work together to provide you with the care you need, when you need it.  We recommend signing up for the patient portal called "MyChart".  Sign up information is provided on this After Visit Summary.  MyChart is used to connect with patients for Virtual Visits (Telemedicine).  Patients are able to view lab/test results, encounter notes, upcoming appointments, etc.  Non-urgent messages can be sent to your provider as well.   To learn more about what you can do with MyChart, go to [email or web address].    Your next appointment:   6 month(s)  The format for your next appointment:   In Person  Provider:   Allegra Lai, MD   Thank you for choosing [facility]!!   Trinidad Curet, RN [phone]    Other Instructions Please be complaint with your CPAP.   Mexiletine capsules What is this medicine? MEXILETINE (mex [place] e teen) is an antiarrhythmic agent. This medicine is used to treat irregular heart rhythm and can slow rapid heartbeats. It can help your heart to return to and maintain a normal rhythm. Because of the side effects caused by this medicine, it is usually used for heartbeat problems that may be life-threatening. This medicine may be used for other purposes; ask your health care provider or pharmacist if you have questions. COMMON BRAND NAME(S): Mexitil What should I tell my health care provider before I take this medicine? They need to know if you have any of these conditions:  liver disease  other heart problems  previous heart attack  an unusual or allergic reaction to mexiletine, other medicines, foods, dyes, or preservatives  pregnant or trying to get pregnant  breast-feeding How should I use this medicine? Take this medicine by mouth with a glass of water. Follow the directions on the prescription label. It is recommended that you take this medicine with food or an antacid. Take your doses at regular intervals. Do not take your medicine more often than directed. Do not stop taking except on the advice of your doctor or health care professional. Talk to your pediatrician regarding the use of this medicine in children. Special care may be needed. Overdosage: If you think you have taken too much of this medicine contact a poison control center or emergency room at once.  NOTE: This medicine is only for you. Do not share this medicine with others. What if I miss a dose? If you miss a dose, take it as soon as you can. If it is almost time for your next dose, take only that dose. Do not take double or extra doses. What may interact with this medicine? Do not take this medicine with any of the following  medications:  dofetilide This medicine may also interact with the following medications:  caffeine  cimetidine  medicines for depression, anxiety, or psychotic disturbances  medicines to control heart rhythm  phenobarbital  phenytoin  rifampin  theophylline This list may not describe all possible interactions. Give your health care provider a list of all the medicines, herbs, non-prescription drugs, or dietary supplements you use. Also tell them if you smoke, drink alcohol, or use illegal drugs. Some items may interact with your medicine. What should I watch for while using this medicine? Your condition will be monitored closely when you first begin therapy. Often, this drug is first started in a hospital or other monitored health care setting. Once you are on maintenance therapy, visit your doctor or health care provider for regular checks on your progress. Because your condition and use of this medicine carry some risk, it is a good idea to carry an identification card, necklace or bracelet with details of your condition, medications, and doctor or health care provider. You may get drowsy or dizzy. Do not drive, use machinery, or do anything that needs mental alertness until you know how this medicine affects you. Do not stand or sit up quickly, especially if you are an older patient. This reduces the risk of dizzy or fainting spells. Alcohol can make you more dizzy, increase flushing and rapid heartbeats. Avoid alcoholic drinks. This medicine may cause serious skin reactions. They can happen weeks to months after starting the medicine. Contact your health care provider right away if you notice fevers or flu-like symptoms with a rash. The rash may be red or purple and then turn into blisters or peeling of the skin. Or, you might notice a red rash with swelling of the face, lips or lymph nodes in your neck or under your arms. What side effects may I notice from receiving this  medicine? Side effects that you should report to your doctor or health care professional as soon as possible:  allergic reactions like skin rash, itching or hives, swelling of the face, lips, or tongue  breathing problems  chest pain, continued irregular heartbeats  rash, fever, and swollen lymph nodes  redness, blistering, peeling or loosening of the skin, including inside the mouth  seizures  skin rash  trembling, shaking  unusual bleeding or bruising  unusually weak or tired Side effects that usually do not require medical attention (report to your doctor or health care professional if they continue or are bothersome):  blurred vision  difficulty walking  heartburn  nausea, vomiting  nervousness  numbness, or tingling in the fingers or toes This list may not describe all possible side effects. Call your doctor for medical advice about side effects. You may report side effects to FDA at 1-800-FDA-1088. Where should I keep my medicine? Keep out of reach of children. Store at room temperature between 15 and 30 degrees C (59 and 86 degrees F). Throw away any unused medicine after the expiration date. NOTE: This sheet is a summary. It may not cover all possible information. If you have questions about this medicine, talk  to your doctor, pharmacist, or health care provider.  2020 Elsevier/Gold Standard (2018-09-11 09:25:42)

## 2019-09-22 NOTE — Progress Notes (Signed)
Electrophysiology Office Note   Date:  09/22/2019   ID:  Joe [name], DOB Nov 04, 1970, MRN [id number]  PCP:  [name], MD  Cardiologist: Agustin Cree Primary Electrophysiologist:  Blue Winther Meredith Leeds, MD    Chief Complaint: CHF   History of Present Illness: Joe [name] is a 49 y.o. male who is being seen today for the evaluation of CHF at the request of Turner, Eber Hong, MD. Presenting today for electrophysiology evaluation.  He has a history of chronic systolic heart failure, coronary artery disease, hypertension, hyperlipidemia, obesity, OSA on CPAP, and type 2 diabetes.  He currently is on optimal medical therapy with Entresto, Aldactone, and Coreg.  He has NYHA class III symptoms.  He was having quite a bit of fatigue and thus he had a sleep study which showed sleep apnea and is now on CPAP.  He is also now status post [place] CRT-D implanted 03/19/2019.  Today, denies symptoms of palpitations, chest pain, shortness of breath, orthopnea, PND, lower extremity edema, claudication, dizziness, presyncope, syncope, bleeding, or neurologic sequela. The patient is tolerating medications without difficulties.  Since his device implant he has felt better.  He continues to have a few days of feeling weak and fatigued, but overall his energy has improved.  Device interrogation shows biventricular pacing at 88%.  He is having quite a few PVCs and nonsustained VT noted on device interrogation.   Past Medical History:  Diagnosis Date  . Abnormal EKG   . Body mass index 45.0-49.9, adult ([place])   . CHF (congestive heart failure) ([place])   . Complication of anesthesia 1995   "had hard time waking up; breathing w/vasectomy"  . Coronary artery disease   . Erectile dysfunction   . Essential hypertension, benign   . Fatigue   . GERD (gastroesophageal reflux disease)   . High cholesterol    "just started tx yesterday" (09/06/2017)  . History of gout    "RX prn" (09/06/2017)  . Muscular chest  pain   . Obesity   . OSA on CPAP   . Plantar fasciitis   . Right bundle branch block   . Testosterone deficiency   . Type II diabetes mellitus ([place])    "started tx 08/28/2017"   Past Surgical History:  Procedure Laterality Date  . BACK SURGERY    . BIV ICD INSERTION CRT-D N/A 03/19/2019   Procedure: BIV ICD INSERTION CRT-D;  Surgeon: Constance Haw, MD;  Location: [facility];  Service: Cardiovascular;  Laterality: N/A;  . CORONARY ANGIOPLASTY WITH STENT PLACEMENT  09/06/2017   "3 stents"  . CORONARY STENT INTERVENTION N/A 09/06/2017   Procedure: CORONARY STENT INTERVENTION;  Surgeon: Jettie Booze, MD;  Location: [place] [facility];  Service: Cardiovascular;  Laterality: N/A;  . CORONARY STENT INTERVENTION N/A 02/15/2018   Procedure: CORONARY STENT INTERVENTION;  Surgeon: Jettie Booze, MD;  Location: [place] [facility];  Service: Cardiovascular;  Laterality: N/A;  . KNEE ARTHROSCOPY Right   . LEFT HEART CATH AND CORONARY ANGIOGRAPHY N/A 09/06/2017   Procedure: LEFT HEART CATH AND CORONARY ANGIOGRAPHY;  Surgeon: Jettie Booze, MD;  Location: [place];  Service: Cardiovascular;  Laterality: N/A;  . LEFT HEART CATH AND CORONARY ANGIOGRAPHY N/A 02/15/2018   Procedure: LEFT HEART CATH AND CORONARY ANGIOGRAPHY;  Surgeon: Jettie Booze, MD;  Location: [place] [facility];  Service: Cardiovascular;  Laterality: N/A;  . LEFT HEART CATH AND CORONARY ANGIOGRAPHY N/A 06/27/2018   Procedure: LEFT  HEART CATH AND CORONARY ANGIOGRAPHY;  Surgeon: Jettie Booze, MD;  Location: [facility];  Service: Cardiovascular;  Laterality: N/A;  . LUMBAR SPINE SURGERY  ~ 2001   Intradiscal Electrothermoplasty  . VASECTOMY  1995     Current Outpatient Medications  Medication Sig Dispense Refill  . acetaminophen (TYLENOL) 500 MG tablet Take 1,000 mg by mouth every 6 (six) hours as needed for moderate pain or headache.    . allopurinol (ZYLOPRIM) 100 MG  tablet Take 100 mg by mouth daily as needed (for gout).     Marland Kitchen aspirin EC 81 MG tablet Take 1 tablet (81 mg total) by mouth daily. 90 tablet 3  . Blood Glucose Monitoring Suppl (ONE TOUCH ULTRA 2) w/Device KIT 1 Device by Other route 2 (two) times daily.    . carvedilol (COREG) 12.5 MG tablet TAKE 1 TABLET BY MOUTH TWICE A DAY 180 tablet 2  . clopidogrel (PLAVIX) 75 MG tablet TAKE 1 TABLET (75 MG TOTAL) BY MOUTH AT BEDTIME. 90 tablet 2  . dapagliflozin propanediol (FARXIGA) 10 MG TABS tablet Take 10 mg by mouth daily before breakfast. 30 tablet 6  . digoxin (LANOXIN) 0.125 MG tablet Take 1 tablet (0.125 mg total) by mouth daily. 90 tablet 3  . ENTRESTO 97-103 MG TAKE 1 TABLET BY MOUTH TWICE A DAY 180 tablet 1  . furosemide (LASIX) 40 MG tablet TAKE 2 TABLETS BY MOUTH EVERY DAY 180 tablet 1  . ibuprofen (ADVIL) 200 MG tablet Take 800 mg by mouth every 8 (eight) hours as needed for headache or moderate pain.    . indomethacin (INDOCIN) 50 MG capsule TAKE 1 CAPSULE 3 TIMES A DAY AS NEEDED FOR GOUT FLARE SYMPTOMS    . metFORMIN (GLUCOPHAGE-XR) 500 MG 24 hr tablet Take 1,000 mg by mouth in the morning and at bedtime.     . nitroGLYCERIN (NITROSTAT) 0.4 MG SL tablet Place 1 tablet (0.4 mg total) under the tongue every 5 (five) minutes as needed for chest pain. 25 tablet 6  . omeprazole (PRILOSEC) 40 MG capsule Take 1 capsule (40 mg total) by mouth daily. 90 capsule 1  . ondansetron (ZOFRAN ODT) 4 MG disintegrating tablet '4mg'$  ODT q4 hours prn nausea/vomit 20 tablet 0  . ONETOUCH ULTRA test strip 1 each by Other route 2 (two) times daily.    . Polyethylene Glycol 400 (BLINK TEARS OP) Place 2 drops into both eyes 2 (two) times daily as needed (for dry eyes).    . Potassium Chloride ER 20 MEQ TBCR TAKE 1 TABLET BY MOUTH EVERY DAY 90 tablet 2  . ranolazine (RANEXA) 500 MG 12 hr tablet TAKE 1 TABLET BY MOUTH TWICE A DAY 180 tablet 3  . rosuvastatin (CRESTOR) 40 MG tablet TAKE 1 TABLET BY MOUTH EVERY DAY IN THE  EVENING 90 tablet 2  . spironolactone (ALDACTONE) 25 MG tablet TAKE 0.5 TABLETS BY MOUTH EVERY DAY 45 tablet 4   No current facility-administered medications for this visit.    Allergies:   Patient has no known allergies.   Social History:  The patient  reports that he has never smoked. He has never used smokeless tobacco. He reports that he does not drink alcohol or use drugs.   Family History:  The patient's family history includes Brain cancer in his mother; Diabetes Mellitus II in his brother and paternal grandfather; Lung cancer in his mother; Lymphoma in his father.   ROS:  Please see the history of present illness.  Otherwise, review of systems is positive for none.   All other systems are reviewed and negative.   PHYSICAL EXAM: VS:  BP 120/72   Pulse 89   Ht '6\' 3"'$  (1.905 m)   Wt (!) 366 lb (166 kg)   SpO2 95%   BMI 45.75 kg/m  , BMI Body mass index is 45.75 kg/m. GEN: Well nourished, well developed, in no acute distress  HEENT: normal  Neck: no JVD, carotid bruits, or masses Cardiac: irregular; no murmurs, rubs, or gallops,no edema  Respiratory:  clear to auscultation bilaterally, normal work of breathing GI: soft, nontender, nondistended, + BS MS: no deformity or atrophy  Skin: warm and dry, device site well healed Neuro:  Strength and sensation are intact Psych: euthymic mood, full affect  EKG:  EKG is ordered today. Personal review of the ekg ordered shows atrial sensed, ventricular paced, frequent multifocal PVCs  Personal review of the device interrogation today. Results in [place]: 12/20/2018: ALT 23 03/03/2019: Hemoglobin 13.3; Platelets 222 03/26/2019: B Natriuretic Peptide 52.0 09/12/2019: BUN 10; Creatinine, Ser 1.08; Potassium 4.1; Sodium 140    Lipid Panel     Component Value Date/Time   CHOL 124 03/27/2018 0940   TRIG 218 (H) 03/27/2018 0940   HDL 35 (L) 03/27/2018 0940   CHOLHDL 3.5 03/27/2018 0940   LDLCALC 45 03/27/2018 0940      Wt Readings from Last 3 Encounters:  09/22/19 (!) 366 lb (166 kg)  09/12/19 (!) 373 lb 8 oz (169.4 kg)  08/24/19 (!) 365 lb (165.6 kg)      Other studies Reviewed: Additional studies/ records that were reviewed today include: TTE 12/27/18  Review of the above records today demonstrates:   1. The left ventricle has severely reduced systolic function, with severe diffuse hypokinesia and an ejection fraction of 20-25%. The cavity size was moderately dilated. There is severe concentric left ventricular hypertrophy. Left ventricular diastolic  Doppler parameters are indeterminate.  2. The right ventricle has moderately reduced systolic function. The cavity was mildly enlarged. There is no increase in right ventricular wall thickness.  3. The aortic root, ascending aorta and aortic arch are normal in size and structure.  4. No evidence of mitral valve stenosis.  5. The aortic valve has an indeterminate number of cusps. No stenosis of the aortic valve.  LHC 06/2018  Dist LAD lesion is 40% stenosed. Diffusely diseased apical LAD.  LAD stents widely patent.  Previously placed Mid Cx drug eluting stent is widely patent.  Previously placed Prox Cx drug eluting stent is widely patent.  Mid RCA lesion is 25% stenosed.  Ost RPDA to RPDA lesion is 50% stenosed.  Dist RCA lesion is 25% stenosed with 25% stenosed side branch in Post Atrio.  LV end diastolic pressure is mildly elevated. LVEDP 19 mm Hg.  There is no aortic valve stenosis.  ASSESSMENT AND PLAN:  1.  Systolic heart failure due to ischemic cardiomyopathy: Currently on optimal medical therapy with carvedilol, Entresto, Aldactone.  He is status post [place] CRT-D implanted 03/19/2019.  Device functioning appropriately.  No changes at this time.    2.  Hypertension: Currently well controlled  3.  Coronary artery disease: No current chest pain.  Continue with current management.  4.  Obstructive sleep apnea: CPAP compliance  encouraged  5.  PVCs: Elevated burden which is reducing his BiV pacing.  Unfortunately he is having multifocal PVCs.  Due to that, we Maryon Kemnitz start him on mexiletine  150 mg.  Current medicines are reviewed at length with the patient today.   The patient does not have concerns regarding his medicines.  The following changes were made today: Start mexiletine  Labs/ tests ordered today include:  Orders Placed This Encounter  Procedures  . EKG 12-Lead     Disposition:   FU with Ramy Greth 6 months  Signed, Ariyan Sinnett Meredith Leeds, MD  09/22/2019 3:23 PM     [facility] [address] [place] [address] [phone] (office) [phone] (fax)

## 2019-10-20 ENCOUNTER — Ambulatory Visit (INDEPENDENT_AMBULATORY_CARE_PROVIDER_SITE_OTHER): Payer: BC Managed Care – PPO | Admitting: *Deleted

## 2019-10-20 DIAGNOSIS — I5022 Chronic systolic (congestive) heart failure: Secondary | ICD-10-CM

## 2019-10-20 DIAGNOSIS — I255 Ischemic cardiomyopathy: Secondary | ICD-10-CM

## 2019-10-20 LAB — CUP PACEART REMOTE DEVICE CHECK
Battery Remaining Longevity: 71 mo
Battery Remaining Percentage: 87 %
Battery Voltage: 3.05 V
Brady Statistic AP VP Percent: 1.3 %
Brady Statistic AP VS Percent: 1 %
Brady Statistic AS VP Percent: 88 %
Brady Statistic AS VS Percent: 8.3 %
Brady Statistic RA Percent Paced: 1 %
Date Time Interrogation Session: 20210503020015
HighPow Impedance: 74 Ohm
HighPow Impedance: 74 Ohm
Implantable Lead Implant Date: 20200930
Implantable Lead Implant Date: 20200930
Implantable Lead Implant Date: 20200930
Implantable Lead Location: 753858
Implantable Lead Location: 753859
Implantable Lead Location: 753860
Implantable Pulse Generator Implant Date: 20200930
Lead Channel Impedance Value: 1075 Ohm
Lead Channel Impedance Value: 450 Ohm
Lead Channel Impedance Value: 480 Ohm
Lead Channel Pacing Threshold Amplitude: 0.75 V
Lead Channel Pacing Threshold Amplitude: 0.75 V
Lead Channel Pacing Threshold Amplitude: 1.5 V
Lead Channel Pacing Threshold Pulse Width: 0.5 ms
Lead Channel Pacing Threshold Pulse Width: 0.5 ms
Lead Channel Pacing Threshold Pulse Width: 0.5 ms
Lead Channel Sensing Intrinsic Amplitude: 12 mV
Lead Channel Sensing Intrinsic Amplitude: 3.6 mV
Lead Channel Setting Pacing Amplitude: 2 V
Lead Channel Setting Pacing Amplitude: 2.5 V
Lead Channel Setting Pacing Amplitude: 2.5 V
Lead Channel Setting Pacing Pulse Width: 0.5 ms
Lead Channel Setting Pacing Pulse Width: 0.5 ms
Lead Channel Setting Sensing Sensitivity: 0.5 mV
Pulse Gen Serial Number: 9901124

## 2019-10-21 NOTE — Progress Notes (Signed)
Remote ICD transmission.   

## 2019-11-19 ENCOUNTER — Encounter: Payer: Self-pay | Admitting: Cardiology

## 2019-11-20 NOTE — Telephone Encounter (Signed)
Patient missed his appointment and was waiting for the dme to call him back. I reached out to the dme and they are waiting for patient to call them back. Patient has been encouraged to call his dme back to get set up.

## 2019-12-07 ENCOUNTER — Other Ambulatory Visit: Payer: Self-pay | Admitting: Cardiology

## 2019-12-08 ENCOUNTER — Other Ambulatory Visit (HOSPITAL_COMMUNITY): Payer: Self-pay | Admitting: Internal Medicine

## 2019-12-09 ENCOUNTER — Other Ambulatory Visit: Payer: Self-pay | Admitting: Physician Assistant

## 2019-12-23 ENCOUNTER — Ambulatory Visit: Payer: BC Managed Care – PPO | Admitting: Cardiology

## 2019-12-24 ENCOUNTER — Ambulatory Visit: Payer: BC Managed Care – PPO | Admitting: Cardiology

## 2019-12-24 ENCOUNTER — Encounter: Payer: Self-pay | Admitting: Cardiology

## 2019-12-24 ENCOUNTER — Other Ambulatory Visit: Payer: Self-pay

## 2019-12-24 VITALS — BP 105/71 | HR 74 | Ht 75.0 in | Wt 364.4 lb

## 2019-12-24 DIAGNOSIS — Z95810 Presence of automatic (implantable) cardiac defibrillator: Secondary | ICD-10-CM

## 2019-12-24 DIAGNOSIS — Z9861 Coronary angioplasty status: Secondary | ICD-10-CM

## 2019-12-24 DIAGNOSIS — I255 Ischemic cardiomyopathy: Secondary | ICD-10-CM

## 2019-12-24 DIAGNOSIS — I2510 Atherosclerotic heart disease of native coronary artery without angina pectoris: Secondary | ICD-10-CM | POA: Diagnosis not present

## 2019-12-24 DIAGNOSIS — I10 Essential (primary) hypertension: Secondary | ICD-10-CM | POA: Diagnosis not present

## 2019-12-24 NOTE — Progress Notes (Signed)
Cardiology Office Note:    Date:  12/24/2019   ID:  Joe [name], DOB [birth year], MRN [id number]  PCP:  Joe Bender, PA-C  Cardiologist:  Joe Campus, MD    Referring MD: Joe Margarita, MD   No chief complaint on file. Doing fine  History of Present Illness:    Joe [name] is a 49 y.o. male He did have a stent to mid and distal LAD in March 2019 after that he recovered quite nicely did very well however gradually started deteriorating. Eventually end up having another cardiac catheterization in January of this year which showed 40% distal LAD, stents were patent, mid RCA got 25% lesion right PDA had 50% stenosis the circumflex artery were open. No intervention has been required. Since that time he gradually deteriorated. His last echocardiogram showed ejection fraction 20 to 25% in spite of optimal medical therapy which include Entresto Aldactone as well as Coreg. He is symptomatic he is [place] Heart Association class is 3. Eventually on 19 March 2019 he received CRT[facility]D by [facility]  Since that time he gradually getting better.  He got more energy shortness of breath improved and overall he looks good.    He comes today 2 months of follow-up he complained of having good days bad days overall he said he feels about 50% better.  Denies have any chest pain tightness squeezing pressure burning chest chest fatigue tiredness and exhaustion.  Past Medical History:  Diagnosis Date  . Abnormal EKG   . Body mass index 45.0-49.9, adult ([place])   . CHF (congestive heart failure) ([place])   . Complication of anesthesia 1995   "had hard time waking up; breathing w/vasectomy"  . Coronary artery disease   . Erectile dysfunction   . Essential hypertension, benign   . Fatigue   . GERD (gastroesophageal reflux disease)   . High cholesterol    "just started tx yesterday" (09/06/2017)  . History of gout    "RX prn" (09/06/2017)  . Muscular chest pain   . 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 Obesity   . OSA on CPAP   . Plantar fasciitis   . Right bundle branch block   . Testosterone deficiency   . Type II diabetes mellitus ([place])    "started tx 08/28/2017"    Past Surgical History:  Procedure Laterality Date  . BACK SURGERY    . BIV ICD INSERTION CRT-D N/A 03/19/2019   Procedure: BIV ICD INSERTION CRT-D;  Surgeon: Constance Haw, MD;  Location: [facility];  Service: Cardiovascular;  Laterality: N/A;  . CORONARY ANGIOPLASTY WITH STENT PLACEMENT  09/06/2017   "3 stents"  . CORONARY STENT INTERVENTION N/A 09/06/2017   Procedure: CORONARY STENT INTERVENTION;  Surgeon: Jettie Booze, MD;  Location: [facility];  Service: Cardiovascular;  Laterality: N/A;  . CORONARY STENT INTERVENTION N/A 02/15/2018   Procedure: CORONARY STENT INTERVENTION;  Surgeon: Jettie Booze, MD;  Location: [place] [facility];  Service: Cardiovascular;  Laterality: N/A;  . KNEE ARTHROSCOPY Right   . LEFT HEART CATH AND CORONARY ANGIOGRAPHY N/A 09/06/2017   Procedure: LEFT HEART CATH AND CORONARY ANGIOGRAPHY;  Surgeon: Jettie Booze, MD;  Location: [facility] [facility];  Service: Cardiovascular;  Laterality: N/A;  . LEFT HEART CATH AND CORONARY ANGIOGRAPHY N/A 02/15/2018   Procedure: LEFT HEART CATH AND CORONARY ANGIOGRAPHY;  Surgeon: Jettie Booze, MD;  Location: [facility];  Service: Cardiovascular;  Laterality: N/A;  . LEFT HEART CATH AND CORONARY ANGIOGRAPHY N/A 06/27/2018   Procedure: LEFT HEART CATH AND CORONARY ANGIOGRAPHY;  Surgeon: Jettie Booze, MD;  Location: [facility];  Service: Cardiovascular;  Laterality: N/A;  . LUMBAR SPINE SURGERY  ~ 2001   Intradiscal Electrothermoplasty  . VASECTOMY  1995    Current Medications: Current Meds  Medication Sig  . acetaminophen (TYLENOL) 500 MG tablet Take 1,000 mg by mouth every 6 (six) hours as needed for moderate pain or headache.    . allopurinol (ZYLOPRIM) 100 MG tablet Take 100 mg by mouth daily as needed (for gout).   Marland Kitchen aspirin EC 81 MG tablet Take 1 tablet (81 mg total) by mouth daily.  . Blood Glucose Monitoring Suppl (ONE TOUCH ULTRA 2) w/Device KIT 1 Device by Other route 2 (two) times daily.  . carvedilol (COREG) 12.5 MG tablet TAKE 1 TABLET BY MOUTH TWICE A DAY  . clopidogrel (PLAVIX) 75 MG tablet TAKE 1 TABLET (75 MG TOTAL) BY MOUTH AT BEDTIME.  . dapagliflozin propanediol (FARXIGA) 10 MG TABS tablet Take 1 tablet (10 mg total) by mouth daily. Needs appt  . digoxin (LANOXIN) 0.125 MG tablet Take 1 tablet (0.125 mg total) by mouth daily.  Marland Kitchen ENTRESTO 97-103 MG TAKE 1 TABLET BY MOUTH TWICE A DAY  . furosemide (LASIX) 40 MG tablet TAKE 2 TABLETS BY MOUTH EVERY DAY  . ibuprofen (ADVIL) 200 MG tablet Take 800 mg by mouth every 8 (eight) hours as needed for headache or moderate pain.  . indomethacin (INDOCIN) 50 MG capsule TAKE 1 CAPSULE 3 TIMES A DAY AS NEEDED FOR GOUT FLARE SYMPTOMS  . metFORMIN (GLUCOPHAGE-XR) 500 MG 24 hr tablet Take 1,000 mg by mouth in the morning and at bedtime.   . mexiletine (MEXITIL) 200 MG capsule Take 1 capsule (200 mg total) by mouth 2 (two) times daily.  . nitroGLYCERIN (NITROSTAT) 0.4 MG SL tablet Place 1 tablet (0.4 mg total) under the tongue every 5 (five) minutes as needed for chest pain.  Marland Kitchen omeprazole (PRILOSEC) 40 MG capsule Take 1 capsule (40 mg total) by mouth daily.  . ondansetron (ZOFRAN ODT) 4 MG disintegrating tablet 50m ODT q4 hours prn nausea/vomit  . ONETOUCH ULTRA test strip 1 each by Other route 2 (two) times daily.  . Polyethylene Glycol 400 (BLINK TEARS OP) Place 2 drops into both eyes 2 (two) times daily as needed (for dry eyes).  . Potassium Chloride ER 20 MEQ TBCR TAKE 1 TABLET BY MOUTH EVERY DAY  . ranolazine (RANEXA) 500 MG 12 hr tablet TAKE 1 TABLET BY MOUTH TWICE A DAY  . rosuvastatin (CRESTOR) 40 MG tablet TAKE 1 TABLET BY MOUTH EVERY DAY IN THE EVENING  .  spironolactone (ALDACTONE) 25 MG tablet TAKE 1/2 TABLET BY MOUTH EVERY DAY     Allergies:   Patient has no known allergies.   Social History   Socioeconomic History  . Marital status: Married    Spouse name: Not on file  . Number of children: Not on file  . Years of education: Not on file  . Highest education level: Not on file  Occupational History  . Not on file  Tobacco Use  . Smoking status: Never Smoker  . Smokeless tobacco: Never Used  Vaping Use  . Vaping Use: Never used  Substance and Sexual Activity  . Alcohol use: No  . Drug use: Never  . Sexual activity: Not on file  Other Topics Concern  . Not on file  Social History Narrative  . Not on file   Social Determinants of Health   Financial Resource Strain:   . Difficulty of Paying Living Expenses:   Food Insecurity:   . Worried About Charity fundraiser in the Last Year:   . Arboriculturist in the Last Year:   Transportation Needs:   . Film/video editor (Medical):   Marland Kitchen Lack of Transportation (Non-Medical):   Physical Activity:   . Days of Exercise per Week:   . Minutes of Exercise per Session:   Stress:   . Feeling of Stress :   Social Connections:   . Frequency of Communication with Friends and Family:   . Frequency of Social Gatherings with Friends and Family:   . Attends Religious Services:   . Active Member of Clubs or Organizations:   . Attends Archivist Meetings:   Marland Kitchen Marital Status:      Family History: The patient's family history includes Brain cancer in his mother; Diabetes Mellitus II in his brother and paternal grandfather; Lung cancer in his mother; Lymphoma in his father. ROS:   Please see the history of present illness.    All 14 point review of systems negative except as described per history of present illness  EKGs/Labs/Other Studies Reviewed:      Recent Labs: 03/03/2019: Hemoglobin 13.3; Platelets 222 03/26/2019: B Natriuretic Peptide 52.0 09/12/2019: BUN 10;  Creatinine, Ser 1.08; Potassium 4.1; Sodium 140  Recent Lipid Panel    Component Value Date/Time   CHOL 124 03/27/2018 0940   TRIG 218 (H) 03/27/2018 0940   HDL 35 (L) 03/27/2018 0940   CHOLHDL 3.5 03/27/2018 0940   LDLCALC 45 03/27/2018 0940    Physical Exam:    VS:  BP 105/71 (BP Location: Right Arm, Patient Position: Sitting, Cuff Size: Normal)   Pulse 74   Ht _0  (1.905 m)   Wt (!) 364 lb 6.4 oz (165.3 kg)   SpO2 95%   BMI 45.55 kg/m     Wt Readings from Last 3 Encounters:  12/24/19 (!) 364 lb 6.4 oz (165.3 kg)  09/22/19 (!) 366 lb (166 kg)  09/12/19 (!) 373 lb 8 oz (169.4 kg)     GEN:  Well nourished, well developed in no acute distress HEENT: Normal NECK: No JVD; No carotid bruits LYMPHATICS: No lymphadenopathy CARDIAC: RRR, no murmurs, no rubs, no gallops RESPIRATORY:  Clear to auscultation without rales, wheezing or rhonchi  ABDOMEN: Soft, non-tender, non-distended MUSCULOSKELETAL:  No edema; No deformity  SKIN: Warm and dry LOWER EXTREMITIES: no swelling NEUROLOGIC:  Alert and oriented x 3 PSYCHIATRIC:  Normal affect   ASSESSMENT:    1. Ischemic cardiomyopathy   2. Essential hypertension, benign   3. CAD S/P percutaneous coronary angioplasty   4. Presence of cardiac resynchronization therapy defibrillator (CRT-D)    PLAN:    In order of problems listed above:  1. Ischemic cardiomyopathy on appropriate guideline directed medical therapy.  Seems to be stable.  I will ask him to have another echocardiogram done to recheck left ventricle ejection fraction response to medical and therapy. 2. Essential hypertension blood pressure well controlled continue present management. 3. Coronary disease: Stable we will continue present management.  ICD 4. ICD with by V pacing present.  I did review interrogation he did have a lot of extrasystole mexiletine has been started likely he tolerated this medication very well.  Will wait for  another interrogation to see what  is the burden of extrasystoles.  Sadly his PVCs are multifocal therefore ablation is probably not the best option.  I did review note by EP for this visit   Medication Adjustments/Labs and Tests Ordered: Current medicines are reviewed at length with the patient today.  Concerns regarding medicines are outlined above.  Orders Placed This Encounter  Procedures  . Basic metabolic panel  . ECHOCARDIOGRAM COMPLETE   Medication changes: No orders of the defined types were placed in this encounter.   Signed, Park Liter, MD, [facility] 12/24/2019 4:44 PM    [facility]

## 2019-12-24 NOTE — Patient Instructions (Signed)
Medication Instructions:  Your physician recommends that you continue on your current medications as directed. Please refer to the Current Medication list given to you today.  *If you need a refill on your cardiac medications before your next appointment, please call your pharmacy*  Lab Work: TODAY: BMET If you have labs (blood work) drawn today and your tests are completely normal, you will receive your results only by: Marland Kitchen MyChart Message (if you have MyChart) OR . A paper copy in the mail If you have any lab test that is abnormal or we need to change your treatment, we will call you to review the results.  Testing/Procedures: Your physician has requested that you have an echocardiogram. Echocardiography is a painless test that uses sound waves to create images of your heart. It provides your doctor with information about the size and shape of your heart and how well your heart's chambers and valves are working. This procedure takes approximately one hour. There are no restrictions for this procedure.  Follow-Up: At [facility], you and your health needs are our priority.  As part of our continuing mission to provide you with exceptional heart care, we have created designated Provider Care Teams.  These Care Teams include your primary Cardiologist (physician) and Advanced Practice Providers (APPs -  Physician Assistants and Nurse Practitioners) who all work together to provide you with the care you need, when you need it.  We recommend signing up for the patient portal called "MyChart".  Sign up information is provided on this After Visit Summary.  MyChart is used to connect with patients for Virtual Visits (Telemedicine).  Patients are able to view lab/test results, encounter notes, upcoming appointments, etc.  Non-urgent messages can be sent to your provider as well.   To learn more about what you can do with MyChart, go to [email or web address].    Your next appointment:   4  month(s)  The format for your next appointment:   In Person  Provider:   Jenne Campus, MD

## 2019-12-25 LAB — BASIC METABOLIC PANEL
BUN/Creatinine Ratio: 9 (ref 9–20)
BUN: 11 mg/dL (ref 6–24)
CO2: 23 mmol/L (ref 20–29)
Calcium: 9.4 mg/dL (ref 8.7–10.2)
Chloride: 98 mmol/L (ref 96–106)
Creatinine, Ser: 1.2 mg/dL (ref 0.76–1.27)
GFR calc Af Amer: 82 mL/min/{1.73_m2} (ref >59)
GFR calc non Af Amer: 71 mL/min/{1.73_m2} (ref >59)
Glucose: 151 mg/dL — ABNORMAL HIGH (ref 65–99)
Potassium: 4.4 mmol/L (ref 3.5–5.2)
Sodium: 139 mmol/L (ref 134–144)

## 2019-12-30 ENCOUNTER — Other Ambulatory Visit: Payer: Self-pay

## 2019-12-30 ENCOUNTER — Encounter: Payer: Self-pay | Admitting: Dermatology

## 2019-12-30 ENCOUNTER — Ambulatory Visit: Payer: BC Managed Care – PPO | Admitting: Dermatology

## 2019-12-30 DIAGNOSIS — D485 Neoplasm of uncertain behavior of skin: Secondary | ICD-10-CM | POA: Diagnosis not present

## 2019-12-30 MED ORDER — HALOBETASOL PROPIONATE 0.05 % EX CREA: TOPICAL_CREAM | Freq: Every day | CUTANEOUS | 1 refills | Status: DC

## 2019-12-30 NOTE — Patient Instructions (Signed)
Biopsy, Surgery (Curettage) & Surgery (Excision) Aftercare Instructions  1. Okay to remove bandage in 24 hours  2. Wash area with soap and water  3. Apply Vaseline to area twice daily until healed (Not Neosporin)  4. Okay to cover with a Band-Aid to decrease the chance of infection or prevent irritation from clothing; also it's okay to uncover lesion at home.  5. Suture instructions: return to our office in 7-10 or 10-14 days for a nurse visit for suture removal. Variable healing with sutures, if pain or itching occurs call our office. It's okay to shower or bathe 24 hours after sutures are given.  6. The following risks may occur after a biopsy, curettage or excision: bleeding, scarring, discoloration, recurrence, infection (redness, yellow drainage, pain or swelling).  7. For questions, concerns and results call our office at [place] before 4pm & Friday before 3pm. Biopsy results will be available in 1 week.  Biopsy, Surgery (Curettage) & Surgery (Excision) Aftercare Instructions  1. Okay to remove bandage in 24 hours  2. Wash area with soap and water  3. Apply Vaseline to area twice daily until healed (Not Neosporin)  4. Okay to cover with a Band-Aid to decrease the chance of infection or prevent irritation from clothing; also it's okay to uncover lesion at home.  5. Suture instructions: return to our office in 7-10 or 10-14 days for a nurse visit for suture removal. Variable healing with sutures, if pain or itching occurs call our office. It's okay to shower or bathe 24 hours after sutures are given.  6. The following risks may occur after a biopsy, curettage or excision: bleeding, scarring, discoloration, recurrence, infection (redness, yellow drainage, pain or swelling).  7. For questions, concerns and results call our office at [place] before 4pm & Friday before 3pm. Biopsy results will be available in 1 week.  Biopsy, Surgery (Curettage) & Surgery (Excision)  Aftercare Instructions  1. Okay to remove bandage in 24 hours  2. Wash area with soap and water  3. Apply Vaseline to area twice daily until healed (Not Neosporin)  4. Okay to cover with a Band-Aid to decrease the chance of infection or prevent irritation from clothing; also it's okay to uncover lesion at home.  5. Suture instructions: return to our office in 7-10 or 10-14 days for a nurse visit for suture removal. Variable healing with sutures, if pain or itching occurs call our office. It's okay to shower or bathe 24 hours after sutures are given.  6. The following risks may occur after a biopsy, curettage or excision: bleeding, scarring, discoloration, recurrence, infection (redness, yellow drainage, pain or swelling).  7. For questions, concerns and results call our office at [place] before 4pm & Friday before 3pm. Biopsy results will be available in 1 week.  Biopsy, Surgery (Curettage) & Surgery (Excision) Aftercare Instructions  1. Okay to remove bandage in 24 hours  2. Wash area with soap and water  3. Apply Vaseline to area twice daily until healed (Not Neosporin)  4. Okay to cover with a Band-Aid to decrease the chance of infection or prevent irritation from clothing; also it's okay to uncover lesion at home.  5. Suture instructions: return to our office in 7-10 or 10-14 days for a nurse visit for suture removal. Variable healing with sutures, if pain or itching occurs call our office. It's okay to shower or bathe 24 hours after sutures are given.  6. The following risks may occur after a biopsy, curettage or excision: bleeding,  scarring, discoloration, recurrence, infection (redness, yellow drainage, pain or swelling).  7. For questions, concerns and results call our office at [place] before 4pm & Friday before 3pm. Biopsy results will be available in 1 week.

## 2019-12-31 ENCOUNTER — Telehealth: Payer: Self-pay | Admitting: Cardiology

## 2019-12-31 MED ORDER — ISOSORBIDE MONONITRATE ER 30 MG PO TB24: 30.0000 mg | ORAL_TABLET | Freq: Every day | ORAL | 1 refills | Status: DC

## 2019-12-31 NOTE — Telephone Encounter (Signed)
Called patient's wife per dpr. She reports that for the past 3 weeks patient has been very fatigued complains of intermittent chest pain. She reports that he didn't give Dr. Agustin Cree the truth at his last visit. The wife is having same concerns she had before that last heart cath. She is also worried that the other blockage of 40 % has gotten worse. She wants to know if she thinks Dr. Agustin Cree wold advise another heart cath. She reports the patient told her that they are trying to buy a home and he is worried about finding something else wrong that could impact the closing on their home that's why he hasn't said anything. Will consult with Dr. Agustin Cree.

## 2019-12-31 NOTE — Telephone Encounter (Signed)
Called and spoke to patient's wife. Informed her per Dr. Agustin Cree to have him start imdur 30 mg daily and take nitro as needed for chest pain. I also advised her if chest pain comes back or conditions get worse to go to the emergency department and/or reach out to [place]. No further questions.

## 2019-12-31 NOTE — Telephone Encounter (Signed)
Few options:  Start Imdur 30 mg po qd and see if he is better,  Take only NTG on PRN basis,  Only if that does not help we need to talk about cardiac cath

## 2019-12-31 NOTE — Telephone Encounter (Signed)
   Pt's wife calling, she said pt has not been truthful with Dr. Raliegh Ip from his last visit, she said pt seems like getting worst, getting more tired and getting more SOB. She said she wanted to discuss with the nurse and if possible with Dr. Raliegh Ip if pt need to have another heart cath  Please advise

## 2020-01-01 ENCOUNTER — Telehealth: Payer: Self-pay

## 2020-01-01 NOTE — Telephone Encounter (Signed)
-----   Message from Lavonna Monarch, MD sent at 01/01/2020  6:41 AM EDT ----- No cancer but schedule in late fall or winter to decide on [place] or PDT

## 2020-01-01 NOTE — Telephone Encounter (Signed)
Phone call to patient with his pathology results. Patient aware of results.  

## 2020-01-19 ENCOUNTER — Ambulatory Visit (INDEPENDENT_AMBULATORY_CARE_PROVIDER_SITE_OTHER): Payer: BC Managed Care – PPO | Admitting: *Deleted

## 2020-01-19 DIAGNOSIS — I5021 Acute systolic (congestive) heart failure: Secondary | ICD-10-CM

## 2020-01-19 DIAGNOSIS — I255 Ischemic cardiomyopathy: Secondary | ICD-10-CM

## 2020-01-21 LAB — CUP PACEART REMOTE DEVICE CHECK
Battery Remaining Longevity: 68 mo
Battery Remaining Percentage: 84 %
Battery Voltage: 3.02 V
Brady Statistic AP VP Percent: 1.9 %
Brady Statistic AP VS Percent: 1 %
Brady Statistic AS VP Percent: 87 %
Brady Statistic AS VS Percent: 8.4 %
Brady Statistic RA Percent Paced: 1 %
Date Time Interrogation Session: 20210803182704
HighPow Impedance: 71 Ohm
HighPow Impedance: 71 Ohm
Implantable Lead Implant Date: 20200930
Implantable Lead Implant Date: 20200930
Implantable Lead Implant Date: 20200930
Implantable Lead Location: 753858
Implantable Lead Location: 753859
Implantable Lead Location: 753860
Implantable Pulse Generator Implant Date: 20200930
Lead Channel Impedance Value: 1050 Ohm
Lead Channel Impedance Value: 480 Ohm
Lead Channel Impedance Value: 490 Ohm
Lead Channel Pacing Threshold Amplitude: 0.75 V
Lead Channel Pacing Threshold Amplitude: 0.75 V
Lead Channel Pacing Threshold Amplitude: 1.5 V
Lead Channel Pacing Threshold Pulse Width: 0.5 ms
Lead Channel Pacing Threshold Pulse Width: 0.5 ms
Lead Channel Pacing Threshold Pulse Width: 0.5 ms
Lead Channel Sensing Intrinsic Amplitude: 11.7 mV
Lead Channel Sensing Intrinsic Amplitude: 3.5 mV
Lead Channel Setting Pacing Amplitude: 2 V
Lead Channel Setting Pacing Amplitude: 2.5 V
Lead Channel Setting Pacing Amplitude: 2.5 V
Lead Channel Setting Pacing Pulse Width: 0.5 ms
Lead Channel Setting Pacing Pulse Width: 0.5 ms
Lead Channel Setting Sensing Sensitivity: 0.5 mV
Pulse Gen Serial Number: 9901124

## 2020-01-22 ENCOUNTER — Encounter: Payer: Self-pay | Admitting: Dermatology

## 2020-01-22 NOTE — Progress Notes (Signed)
Remote ICD transmission.   

## 2020-01-22 NOTE — Progress Notes (Signed)
   Follow-Up Visit   Subjective  Joe [name] is a 49 y.o. male who presents for the following: Skin Problem (Patient here today for spot on left cheek x few months.  Per patient getting larger, no bleeding per patient).  Growths Location: Left cheek duration:  Quality:  Associated Signs/Symptoms: Modifying Factors:  Severity:  Timing: Context:   Objective  Well appearing patient in no apparent distress; mood and affect are within normal limits.  A focused examination was performed including Head and neck.. Relevant physical exam findings are noted in the Assessment and Plan.   Assessment & Plan    Neoplasm of uncertain behavior of skin (2) Left cheek superior  Skin / nail biopsy Type of biopsy: tangential   Informed consent: discussed and consent obtained   Timeout: patient name, date of birth, surgical site, and procedure verified   Procedure prep:  Patient was prepped and draped in usual sterile fashion Prep type:  Chlorhexidine Anesthesia: the lesion was anesthetized in a standard fashion   Anesthetic:  1% lidocaine w/ epinephrine 1-100,000 local infiltration Instrument used: flexible razor blade   Hemostasis achieved with: ferric subsulfate   Outcome: patient tolerated procedure well   Post-procedure details: wound care instructions given    Specimen 1 - Surgical pathology Differential Diagnosis: bcc scc Check Margins: No  Left Cheek inf  Skin / nail biopsy Type of biopsy: tangential   Informed consent: discussed and consent obtained   Timeout: patient name, date of birth, surgical site, and procedure verified   Procedure prep:  Patient was prepped and draped in usual sterile fashion Prep type:  Chlorhexidine Anesthesia: the lesion was anesthetized in a standard fashion   Anesthetic:  1% lidocaine w/ epinephrine 1-100,000 local infiltration Instrument used: flexible razor blade   Hemostasis achieved with: ferric subsulfate   Outcome: patient tolerated  procedure well   Post-procedure details: wound care instructions given    Specimen 2 - Surgical pathology Differential Diagnosis: bcc scc Check Margins: No     I, Lavonna Monarch, MD, have reviewed all documentation for this visit.  The documentation on 01/22/20 for the exam, diagnosis, procedures, and orders are all accurate and complete.

## 2020-02-04 ENCOUNTER — Other Ambulatory Visit (HOSPITAL_COMMUNITY): Payer: Self-pay | Admitting: *Deleted

## 2020-02-04 MED ORDER — DIGOXIN 125 MCG PO TABS: 0.1250 mg | ORAL_TABLET | Freq: Every day | ORAL | 3 refills | Status: DC

## 2020-02-06 ENCOUNTER — Other Ambulatory Visit: Payer: Self-pay

## 2020-02-06 ENCOUNTER — Ambulatory Visit (HOSPITAL_BASED_OUTPATIENT_CLINIC_OR_DEPARTMENT_OTHER)
Admission: RE | Admit: 2020-02-06 | Discharge: 2020-02-06 | Disposition: A | Discharge status: Home / Self Care | Payer: BC Managed Care – PPO | Source: Ambulatory Visit | Attending: Cardiology | Admitting: Cardiology

## 2020-02-06 DIAGNOSIS — I255 Ischemic cardiomyopathy: Secondary | ICD-10-CM | POA: Diagnosis not present

## 2020-02-06 LAB — ECHOCARDIOGRAM COMPLETE
AR max vel: 2.74 cm2
AV Area VTI: 2.8 cm2
AV Area mean vel: 2.94 cm2
AV Mean grad: 4 mmHg
AV Peak grad: 7.7 mmHg
Ao pk vel: 1.39 m/s
Area-P 1/2: 4.21 cm2
Calc EF: 31.6 %
MV M vel: 4.13 m/s
MV Peak grad: 68.2 mmHg
Single Plane A2C EF: 40.8 %
Single Plane A4C EF: 20.5 %

## 2020-02-06 MED ORDER — PERFLUTREN LIPID MICROSPHERE
1.0000 mL | INTRAVENOUS | Status: AC | PRN
  Filled 2020-02-06: qty 10

## 2020-02-11 ENCOUNTER — Telehealth: Payer: Self-pay | Admitting: Cardiology

## 2020-02-11 NOTE — Telephone Encounter (Signed)
Patient returned call for Echo results.  ?

## 2020-02-11 NOTE — Telephone Encounter (Signed)
Called patient back informed him of results.

## 2020-03-09 ENCOUNTER — Other Ambulatory Visit: Payer: Self-pay | Admitting: Cardiology

## 2020-03-13 ENCOUNTER — Other Ambulatory Visit: Payer: Self-pay | Admitting: Cardiology

## 2020-04-09 ENCOUNTER — Other Ambulatory Visit: Payer: Self-pay | Admitting: Cardiology

## 2020-04-19 ENCOUNTER — Ambulatory Visit (INDEPENDENT_AMBULATORY_CARE_PROVIDER_SITE_OTHER): Payer: BC Managed Care – PPO

## 2020-04-19 DIAGNOSIS — I255 Ischemic cardiomyopathy: Secondary | ICD-10-CM

## 2020-04-20 LAB — CUP PACEART REMOTE DEVICE CHECK
Battery Remaining Longevity: 67 mo
Battery Remaining Percentage: 81 %
Battery Voltage: 3.01 V
Brady Statistic AP VP Percent: 2.5 %
Brady Statistic AP VS Percent: 1 %
Brady Statistic AS VP Percent: 84 %
Brady Statistic AS VS Percent: 9.4 %
Brady Statistic RA Percent Paced: 1 %
Date Time Interrogation Session: 20211101033343
HighPow Impedance: 73 Ohm
HighPow Impedance: 73 Ohm
Implantable Lead Implant Date: 20200930
Implantable Lead Implant Date: 20200930
Implantable Lead Implant Date: 20200930
Implantable Lead Location: 753858
Implantable Lead Location: 753859
Implantable Lead Location: 753860
Implantable Pulse Generator Implant Date: 20200930
Lead Channel Impedance Value: 1050 Ohm
Lead Channel Impedance Value: 440 Ohm
Lead Channel Impedance Value: 530 Ohm
Lead Channel Pacing Threshold Amplitude: 0.75 V
Lead Channel Pacing Threshold Amplitude: 0.75 V
Lead Channel Pacing Threshold Amplitude: 1.5 V
Lead Channel Pacing Threshold Pulse Width: 0.5 ms
Lead Channel Pacing Threshold Pulse Width: 0.5 ms
Lead Channel Pacing Threshold Pulse Width: 0.5 ms
Lead Channel Sensing Intrinsic Amplitude: 12 mV
Lead Channel Sensing Intrinsic Amplitude: 2.1 mV
Lead Channel Setting Pacing Amplitude: 2 V
Lead Channel Setting Pacing Amplitude: 2.5 V
Lead Channel Setting Pacing Amplitude: 2.5 V
Lead Channel Setting Pacing Pulse Width: 0.5 ms
Lead Channel Setting Pacing Pulse Width: 0.5 ms
Lead Channel Setting Sensing Sensitivity: 0.5 mV
Pulse Gen Serial Number: 9901124

## 2020-04-22 NOTE — Progress Notes (Signed)
Remote ICD transmission.   

## 2020-05-04 ENCOUNTER — Other Ambulatory Visit: Payer: Self-pay

## 2020-05-04 DIAGNOSIS — R5383 Other fatigue: Secondary | ICD-10-CM | POA: Insufficient documentation

## 2020-05-04 DIAGNOSIS — Z9989 Dependence on other enabling machines and devices: Secondary | ICD-10-CM | POA: Insufficient documentation

## 2020-05-04 DIAGNOSIS — E7800 Pure hypercholesterolemia, unspecified: Secondary | ICD-10-CM | POA: Insufficient documentation

## 2020-05-04 DIAGNOSIS — E785 Hyperlipidemia, unspecified: Secondary | ICD-10-CM | POA: Insufficient documentation

## 2020-05-04 DIAGNOSIS — I509 Heart failure, unspecified: Secondary | ICD-10-CM | POA: Insufficient documentation

## 2020-05-04 DIAGNOSIS — R0789 Other chest pain: Secondary | ICD-10-CM | POA: Insufficient documentation

## 2020-05-04 DIAGNOSIS — E119 Type 2 diabetes mellitus without complications: Secondary | ICD-10-CM | POA: Insufficient documentation

## 2020-05-04 DIAGNOSIS — Z8739 Personal history of other diseases of the musculoskeletal system and connective tissue: Secondary | ICD-10-CM | POA: Insufficient documentation

## 2020-05-04 DIAGNOSIS — G4733 Obstructive sleep apnea (adult) (pediatric): Secondary | ICD-10-CM | POA: Insufficient documentation

## 2020-05-04 DIAGNOSIS — M722 Plantar fascial fibromatosis: Secondary | ICD-10-CM | POA: Insufficient documentation

## 2020-05-04 DIAGNOSIS — Z6841 Body Mass Index (BMI) 40.0 and over, adult: Secondary | ICD-10-CM | POA: Insufficient documentation

## 2020-05-05 ENCOUNTER — Ambulatory Visit: Payer: BC Managed Care – PPO | Admitting: Cardiology

## 2020-05-05 ENCOUNTER — Encounter: Payer: Self-pay | Admitting: Cardiology

## 2020-05-05 ENCOUNTER — Other Ambulatory Visit: Payer: Self-pay

## 2020-05-05 VITALS — BP 108/60 | HR 51 | Ht 74.0 in | Wt 360.0 lb

## 2020-05-05 DIAGNOSIS — Z9861 Coronary angioplasty status: Secondary | ICD-10-CM

## 2020-05-05 DIAGNOSIS — I5022 Chronic systolic (congestive) heart failure: Secondary | ICD-10-CM

## 2020-05-05 DIAGNOSIS — Z6841 Body Mass Index (BMI) 40.0 and over, adult: Secondary | ICD-10-CM

## 2020-05-05 DIAGNOSIS — I2510 Atherosclerotic heart disease of native coronary artery without angina pectoris: Secondary | ICD-10-CM | POA: Diagnosis not present

## 2020-05-05 DIAGNOSIS — I255 Ischemic cardiomyopathy: Secondary | ICD-10-CM | POA: Diagnosis not present

## 2020-05-05 MED ORDER — CARVEDILOL 12.5 MG PO TABS: ORAL_TABLET | ORAL | 1 refills | Status: DC

## 2020-05-05 MED ORDER — FUROSEMIDE 40 MG PO TABS: 40.0000 mg | ORAL_TABLET | Freq: Every day | ORAL | 1 refills | Status: DC

## 2020-05-05 NOTE — Progress Notes (Signed)
Cardiology Office Note:    Date:  05/05/2020   ID:  Joe [name], DOB 11/01/1970, MRN [id number]  PCP:  Cyndi Bender, PA-C  Cardiologist:  Jenne Campus, MD    Referring MD: Cyndi Bender, PA-C   Chief Complaint  Patient presents with  . Follow-up  I am doing fine but have a problem with gout  History of Present Illness:    Joe [name] is a 49 y.o. male with past medical history significant for coronary artery disease in March 2019 he required stenting to mid to distal LAD after that he was doing quite nicely but then started getting symptoms again did have a cardiac catheterization in January 2021 which showed 40% stenosis of distal LAD stents were patent, mid RCA had about 25% lesion PDA also got to 50% stenosis.  Circumflex was normal.  At the same time he was noted to have diminished left ventricle ejection fraction with echocardiogram showing ejection fraction 20 to 25%.  Eventually on March 19, 2019 he did receive CRT-D which is centered device.  He feels better however repeated echocardiogram showed continuation of diminished ejection fraction with some improvement to 30 to 35%.  He denies have any chest pain tightness squeezing pressure burning chest exertional fatigue tiredness is there.  He still continues to work.  Sometimes he got very days that he is very tired.  Another complaint that he has right now is gout he takes allopurinol with indomethacin on as-needed basis for it.  Past Medical History:  Diagnosis Date  . Abnormal EKG   . Acute systolic heart failure ([place]) 09/06/2017  . Angina pectoris ([place]) 09/05/2017  . Body mass index 45.0-49.9, adult ([place])   . CAD S/P percutaneous coronary angioplasty 09/07/2017   mLAD and dLAD PCI with DES 09/06/17  . CHF (congestive heart failure) ([place])   . Chronic systolic (congestive) heart failure ([place]) 03/19/2019  . Complication of anesthesia 1995   "had hard time waking up; breathing w/vasectomy"  . Coronary artery disease     . Erectile dysfunction   . Essential hypertension, benign   . Fatigue   . GERD (gastroesophageal reflux disease)   . Gout   . High cholesterol    "just started tx yesterday" (09/06/2017)  . History of gout    "RX prn" (09/06/2017)  . Ischemic cardiomyopathy 09/07/2017   EF 25-35%  . Muscular chest pain   . 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 Obesity   . Obstructive sleep apnea   . OSA on CPAP   . Plantar fasciitis   . Pre-operative clearance 02/11/2018  . 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/02/2019   [facility]  . Right bundle branch block   . Shortness of breath 09/05/2017  . Testosterone deficiency   . Type 2 diabetes mellitus with complication, without long-term current use of insulin ([place]) 09/05/2017  . Type II diabetes mellitus ([place])    "started tx 08/28/2017"    Past Surgical History:  Procedure Laterality Date  . BACK SURGERY    . BIV ICD INSERTION CRT-D N/A 03/19/2019   Procedure: BIV ICD INSERTION CRT-D;  Surgeon: Constance Haw, MD;  Location: [facility];  Service: Cardiovascular;  Laterality: N/A;  . CORONARY ANGIOPLASTY WITH STENT PLACEMENT  09/06/2017   "3 stents"  . CORONARY STENT INTERVENTION N/A 09/06/2017   Procedure: CORONARY STENT INTERVENTION;  Surgeon: Jettie Booze, MD;  Location: [facility];  Service:  Cardiovascular;  Laterality: N/A;  . CORONARY STENT INTERVENTION N/A 02/15/2018   Procedure: CORONARY STENT INTERVENTION;  Surgeon: Jettie Booze, MD;  Location: [facility];  Service: Cardiovascular;  Laterality: N/A;  . KNEE ARTHROSCOPY Right   . LEFT HEART CATH AND CORONARY ANGIOGRAPHY N/A 09/06/2017   Procedure: LEFT HEART CATH AND CORONARY ANGIOGRAPHY;  Surgeon: Jettie Booze, MD;  Location: [facility];  Service: Cardiovascular;  Laterality: N/A;  . LEFT HEART CATH AND CORONARY ANGIOGRAPHY N/A 02/15/2018   Procedure: LEFT HEART  CATH AND CORONARY ANGIOGRAPHY;  Surgeon: Jettie Booze, MD;  Location: [facility];  Service: Cardiovascular;  Laterality: N/A;  . LEFT HEART CATH AND CORONARY ANGIOGRAPHY N/A 06/27/2018   Procedure: LEFT HEART CATH AND CORONARY ANGIOGRAPHY;  Surgeon: Jettie Booze, MD;  Location: [facility];  Service: Cardiovascular;  Laterality: N/A;  . LUMBAR SPINE SURGERY  ~ 2001   Intradiscal Electrothermoplasty  . VASECTOMY  1995    Current Medications: Current Meds  Medication Sig  . acetaminophen (TYLENOL) 500 MG tablet Take 1,000 mg by mouth every 6 (six) hours as needed for moderate pain or headache.  . allopurinol (ZYLOPRIM) 100 MG tablet Take 100 mg by mouth daily as needed (for gout).   Marland Kitchen aspirin EC 81 MG tablet Take 1 tablet (81 mg total) by mouth daily.  . carvedilol (COREG) 12.5 MG tablet TAKE 1 TABLET BY MOUTH TWICE A DAY  . clopidogrel (PLAVIX) 75 MG tablet TAKE 1 TABLET (75 MG TOTAL) BY MOUTH AT BEDTIME.  . dapagliflozin propanediol (FARXIGA) 10 MG TABS tablet Take 1 tablet (10 mg total) by mouth daily. Needs appt  . digoxin (LANOXIN) 0.125 MG tablet Take 1 tablet (0.125 mg total) by mouth daily.  Marland Kitchen ENTRESTO 97-103 MG TAKE 1 TABLET BY MOUTH TWICE A DAY  . furosemide (LASIX) 40 MG tablet TAKE 2 TABLETS BY MOUTH EVERY DAY  . ibuprofen (ADVIL) 200 MG tablet Take 800 mg by mouth every 8 (eight) hours as needed for headache or moderate pain.  . indomethacin (INDOCIN) 50 MG capsule TAKE 1 CAPSULE 3 TIMES A DAY AS NEEDED FOR GOUT FLARE SYMPTOMS  . isosorbide mononitrate (IMDUR) 30 MG 24 hr tablet Take 1 tablet (30 mg total) by mouth daily.  . metFORMIN (GLUCOPHAGE-XR) 500 MG 24 hr tablet Take 1,000 mg by mouth in the morning and at bedtime.   . metFORMIN (GLUCOPHAGE-XR) 500 MG 24 hr tablet Take 4 tablets by mouth in the morning and at bedtime.  . mexiletine (MEXITIL) 200 MG capsule TAKE 1 CAPSULE BY MOUTH TWICE A DAY  . nitroGLYCERIN (NITROSTAT) 0.4 MG SL tablet Place  1 tablet (0.4 mg total) under the tongue every 5 (five) minutes as needed for chest pain.  Marland Kitchen omeprazole (PRILOSEC) 40 MG capsule Take 1 capsule (40 mg total) by mouth daily.  . ondansetron (ZOFRAN ODT) 4 MG disintegrating tablet 4mg  ODT q4 hours prn nausea/vomit  . Polyethylene Glycol 400 (BLINK TEARS OP) Place 2 drops into both eyes 2 (two) times daily as needed (for dry eyes).  . Potassium Chloride ER 20 MEQ TBCR TAKE 1 TABLET BY MOUTH EVERY DAY  . ranolazine (RANEXA) 500 MG 12 hr tablet TAKE 1 TABLET BY MOUTH TWICE A DAY  . rosuvastatin (CRESTOR) 40 MG tablet TAKE 1 TABLET BY MOUTH EVERY DAY IN THE EVENING  . spironolactone (ALDACTONE) 25 MG tablet Take 25 mg by mouth 2 (two) times daily.  Allergies:   Patient has no known allergies.   Social History   Socioeconomic History  . Marital status: Married    Spouse name: Not on file  . Number of children: Not on file  . Years of education: Not on file  . Highest education level: Not on file  Occupational History  . Not on file  Tobacco Use  . Smoking status: Never Smoker  . Smokeless tobacco: Never Used  Vaping Use  . Vaping Use: Never used  Substance and Sexual Activity  . Alcohol use: No  . Drug use: Never  . Sexual activity: Not on file  Other Topics Concern  . Not on file  Social History Narrative  . Not on file   Social Determinants of Health   Financial Resource Strain:   . Difficulty of Paying Living Expenses: Not on file  Food Insecurity:   . Worried About Charity fundraiser in the Last Year: Not on file  . Ran Out of Food in the Last Year: Not on file  Transportation Needs:   . Lack of Transportation (Medical): Not on file  . Lack of Transportation (Non-Medical): Not on file  Physical Activity:   . Days of Exercise per Week: Not on file  . Minutes of Exercise per Session: Not on file  Stress:   . Feeling of Stress : Not on file  Social Connections:   . Frequency of Communication with Friends and Family:  Not on file  . Frequency of Social Gatherings with Friends and Family: Not on file  . Attends Religious Services: Not on file  . Active Member of Clubs or Organizations: Not on file  . Attends Archivist Meetings: Not on file  . Marital Status: Not on file     Family History: The patient's family history includes Brain cancer in his mother; Diabetes Mellitus II in his brother and paternal grandfather; Lung cancer in his mother; Lymphoma in his father. ROS:   Please see the history of present illness.    All 14 point review of systems negative except as described per history of present illness  EKGs/Labs/Other Studies Reviewed:      Recent Labs: 12/24/2019: BUN 11; Creatinine, Ser 1.20; Potassium 4.4; Sodium 139  Recent Lipid Panel    Component Value Date/Time   CHOL 124 03/27/2018 0940   TRIG 218 (H) 03/27/2018 0940   HDL 35 (L) 03/27/2018 0940   CHOLHDL 3.5 03/27/2018 0940   LDLCALC 45 03/27/2018 0940    Physical Exam:    VS:  BP 108/60 (BP Location: Left Arm, Patient Position: Sitting)   Pulse (!) 51   Ht 6\' 2"  (1.88 m)   Wt (!) 360 lb (163.3 kg)   SpO2 91%   BMI 46.22 kg/m     Wt Readings from Last 3 Encounters:  05/05/20 (!) 360 lb (163.3 kg)  12/24/19 (!) 364 lb 6.4 oz (165.3 kg)  09/22/19 (!) 366 lb (166 kg)     GEN:  Well nourished, well developed in no acute distress HEENT: Normal NECK: No JVD; No carotid bruits LYMPHATICS: No lymphadenopathy CARDIAC: RRR, no murmurs, no rubs, no gallops RESPIRATORY:  Clear to auscultation without rales, wheezing or rhonchi  ABDOMEN: Soft, non-tender, non-distended MUSCULOSKELETAL:  No edema; No deformity  SKIN: Warm and dry LOWER EXTREMITIES: no swelling NEUROLOGIC:  Alert and oriented x 3 PSYCHIATRIC:  Normal affect   ASSESSMENT:    1. Ischemic cardiomyopathy   2. Chronic systolic (congestive) heart failure (  [place])   3. CAD S/P percutaneous coronary angioplasty   4. Body mass index 45.0-49.9, adult (H[facility])     PLAN:    In order of problems listed above:  1. Ischemic cardiomyopathy on appropriate medication which include carvedilol Aldactone and Entresto flex sig and I will continue.  He does have CRT-D device however percentage of ventricular pacing is suboptimal.  He is taking mexiletine secondary to frequent PVCs that he have.  I will also try to increase dose of carvedilol from 12.5 to twice daily to 1-1/2 tablets of twice daily.  I warned him about potential side effects which include weakness fatigue tiredness and asking to go back to only 1 tablet twice daily if he develops those.  In the meantime I will ask him to cut down his diuretic he takes Lasix 40 mg twice daily I will reduce this to only once a day on the condition that he will check his weight every single day PCP his weight going up for 2 consecutive days he need to go back on the previous dose.  I am doing this also with hope that they will improve chances for him to get. 2. Chronic systolic congestive heart failure appears to be compensated today. 3. Coronary disease status post PTCA and stenting stable no new issues. 4. Morbid obesity not a problem he understand he start lose weight. 5. Dyslipidemia I did review his K PN from 11/20/2019 showing LDL of 67 HDL of 29.  We will continue present management   Medication Adjustments/Labs and Tests Ordered: Current medicines are reviewed at length with the patient today.  Concerns regarding medicines are outlined above.  No orders of the defined types were placed in this encounter.  Medication changes: No orders of the defined types were placed in this encounter.   Signed, Park Liter, MD, [facility] 05/05/2020 4:25 PM    [place]

## 2020-05-05 NOTE — Patient Instructions (Signed)
Medication Instructions:  Your physician has recommended you make the following change in your medication:   DECREASE: Lasix to 40 mg daily   INCREASE: Carvedilol to 1 and 1/2 tablet twice daily  *If you need a refill on your cardiac medications before your next appointment, please call your pharmacy*   Lab Work: None. If you have labs (blood work) drawn today and your tests are completely normal, you will receive your results only by: Marland Kitchen MyChart Message (if you have MyChart) OR . A paper copy in the mail If you have any lab test that is abnormal or we need to change your treatment, we will call you to review the results.   Testing/Procedures: None   Follow-Up: At [facility], you and your health needs are our priority.  As part of our continuing mission to provide you with exceptional heart care, we have created designated Provider Care Teams.  These Care Teams include your primary Cardiologist (physician) and Advanced Practice Providers (APPs -  Physician Assistants and Nurse Practitioners) who all work together to provide you with the care you need, when you need it.  We recommend signing up for the patient portal called "MyChart".  Sign up information is provided on this After Visit Summary.  MyChart is used to connect with patients for Virtual Visits (Telemedicine).  Patients are able to view lab/test results, encounter notes, upcoming appointments, etc.  Non-urgent messages can be sent to your provider as well.   To learn more about what you can do with MyChart, go to [email or web address].    Your next appointment:   3 month(s)  The format for your next appointment:   In Person  Provider:   Jenne Campus, MD   Other Instructions

## 2020-05-10 ENCOUNTER — Ambulatory Visit: Payer: BC Managed Care – PPO | Admitting: Dermatology

## 2020-06-02 ENCOUNTER — Other Ambulatory Visit: Payer: Self-pay | Admitting: Dermatology

## 2020-06-08 ENCOUNTER — Other Ambulatory Visit: Payer: Self-pay | Admitting: Cardiology

## 2020-06-08 NOTE — Telephone Encounter (Signed)
Rx refill sent to pharmacy. 

## 2020-06-22 ENCOUNTER — Other Ambulatory Visit: Payer: Self-pay

## 2020-06-22 ENCOUNTER — Telehealth: Payer: Self-pay | Admitting: Cardiology

## 2020-06-22 NOTE — Telephone Encounter (Signed)
Pt c/o of Chest Pain: STAT if CP now or developed within 24 hours  1. Are you having CP right now? Per wife, if he is he is not telling her. He finally told her last night that he was having chest tightness for the last couple of weeks.   2. Are you experiencing any other symptoms (ex. SOB, nausea, vomiting, sweating)? fatigue  3. How long have you been experiencing CP? A couple of weeks  4. Is your CP continuous or coming and going? She's not sure.   5. Have you taken Nitroglycerin? no ?   Patient has an appt tomorrow with Dr. Bing Matter.

## 2020-06-22 NOTE — Telephone Encounter (Signed)
We will talk tomorrow.

## 2020-06-22 NOTE — Telephone Encounter (Signed)
Left message for patient to return call.

## 2020-06-23 ENCOUNTER — Ambulatory Visit: Payer: BC Managed Care – PPO | Admitting: Cardiology

## 2020-06-23 ENCOUNTER — Other Ambulatory Visit: Payer: Self-pay

## 2020-06-23 ENCOUNTER — Other Ambulatory Visit: Payer: Self-pay | Admitting: Cardiology

## 2020-06-23 ENCOUNTER — Ambulatory Visit: Payer: BC Managed Care – PPO | Admitting: Dermatology

## 2020-06-23 ENCOUNTER — Encounter: Payer: Self-pay | Admitting: Cardiology

## 2020-06-23 VITALS — BP 100/70 | HR 85 | Ht 75.0 in | Wt 357.0 lb

## 2020-06-23 DIAGNOSIS — Z9861 Coronary angioplasty status: Secondary | ICD-10-CM

## 2020-06-23 DIAGNOSIS — I255 Ischemic cardiomyopathy: Secondary | ICD-10-CM

## 2020-06-23 DIAGNOSIS — R079 Chest pain, unspecified: Secondary | ICD-10-CM | POA: Diagnosis not present

## 2020-06-23 DIAGNOSIS — I10 Essential (primary) hypertension: Secondary | ICD-10-CM

## 2020-06-23 DIAGNOSIS — E118 Type 2 diabetes mellitus with unspecified complications: Secondary | ICD-10-CM

## 2020-06-23 DIAGNOSIS — I5022 Chronic systolic (congestive) heart failure: Secondary | ICD-10-CM

## 2020-06-23 DIAGNOSIS — I2510 Atherosclerotic heart disease of native coronary artery without angina pectoris: Secondary | ICD-10-CM | POA: Diagnosis not present

## 2020-06-23 MED ORDER — RANOLAZINE ER 1000 MG PO TB12: 1000.0000 mg | ORAL_TABLET | Freq: Two times a day (BID) | ORAL | 1 refills | Status: DC

## 2020-06-23 MED ORDER — EZETIMIBE 10 MG PO TABS: 10.0000 mg | ORAL_TABLET | Freq: Every day | ORAL | 1 refills | Status: DC

## 2020-06-23 NOTE — Patient Instructions (Signed)
Medication Instructions:  Your physician has recommended you make the following change in your medication:  INCREASE: Ranexa to 1000 mg twice daily   START: Zetia 10 mg daily   *If you need a refill on your cardiac medications before your next appointment, please call your pharmacy*   Lab Work: Your physician recommends that you return for lab work today: bmp, cbc  If you have labs (blood work) drawn today and your tests are completely normal, you will receive your results only by: Marland Kitchen MyChart Message (if you have MyChart) OR . A paper copy in the mail If you have any lab test that is abnormal or we need to change your treatment, we will call you to review the results.   Testing/Procedures:    [facility]ARDIOVASCULAR DIV[facility] AT [facility] [address] [place] [address] Dept: [phone] Loc: [phone]  Donn Zanetti  06/23/2020  You are scheduled for a Cardiac Catheterization on Monday, January 10 with Dr. Peter [place].  1. Please arrive at the [facility] (Main Entrance A) at [facility]: [address] [place], [place] [address] at 7:00 AM (This time is two hours before your procedure to ensure your preparation). Free valet parking service is available.   Special note: Every effort is made to have your procedure done on time. Please understand that emergencies sometimes delay scheduled procedures.  2. Diet: Do not eat solid foods after midnight.   The patient may have clear liquids until 5am upon the day of the procedure.  3. Labs: You will need to have blood drawn: TODAY    4. Medication instructions in preparation for your procedure:   Contrast Allergy: No    Hold FARXIGA the morning of procedure.  Hold Lasix the morning of the procedure.   Hold spironolactone the morning of procedure.   Do not take Diabetes Med Glucophage (Metformin) on the day of the procedure and HOLD 48 HOURS AFTER THE  PROCEDURE.  On the morning of your procedure, take your Aspirin and any morning medicines NOT listed above.  You may use sips of water.  5. Plan for one night stay--bring personal belongings. 6. Bring a current list of your medications and current insurance cards. 7. You MUST have a responsible person to drive you home. 8. Someone MUST be with you the first 24 hours after you arrive home or your discharge will be delayed. 9. Please wear clothes that are easy to get on and off and wear slip-on shoes.  Thank you for allowing [place] to care for you!   -- [facility]    Follow-Up: At [facility], you and your health needs are our priority.  As part of our continuing mission to provide you with exceptional heart care, we have created designated Provider Care Teams.  These Care Teams include your primary Cardiologist (physician) and Advanced Practice Providers (APPs -  Physician Assistants and Nurse Practitioners) who all work together to provide you with the care you need, when you need it.  We recommend signing up for the patient portal called "MyChart".  Sign up information is provided on this After Visit Summary.  MyChart is used to connect with patients for Virtual Visits (Telemedicine).  Patients are able to view lab/test results, encounter notes, upcoming appointments, etc.  Non-urgent messages can be sent to your provider as well.   To learn more about what you can do with MyChart, go to [email or web address].  Your next appointment:   2 week(s)  The format for your next appointment:   In Person  Provider:   Jenne Campus, MD   Other Instructions   Coronary Angiogram With Stent Coronary angiogram with stent placement is a procedure to widen or open a narrow blood vessel of the heart (coronary artery). Arteries may become blocked by cholesterol buildup (plaques) in the lining of the artery wall. When a coronary artery becomes partially  blocked, blood flow to that area decreases. This may lead to chest pain or a heart attack (myocardial infarction). A stent is a small piece of metal that looks like mesh or spring. Stent placement may be done as treatment after a heart attack, or to prevent a heart attack if a blocked artery is found by a coronary angiogram. Let your health care provider know about:  Any allergies you have, including allergies to medicines or contrast dye.  All medicines you are taking, including vitamins, herbs, eye drops, creams, and over-the-counter medicines.  Any problems you or family members have had with anesthetic medicines.  Any blood disorders you have.  Any surgeries you have had.  Any medical conditions you have, including kidney problems or kidney failure.  Whether you are pregnant or may be pregnant.  Whether you are breastfeeding. What are the risks? Generally, this is a safe procedure. However, serious problems may occur, including:  Damage to nearby structures or organs, such as the heart, blood vessels, or kidneys.  A return of blockage.  Bleeding, infection, or bruising at the insertion site.  A collection of blood under the skin (hematoma) at the insertion site.  A blood clot in another part of the body.  Allergic reaction to medicines or dyes.  Bleeding into the abdomen (retroperitoneal bleeding).  Stroke (rare).  Heart attack (rare). What happens before the procedure? Staying hydrated Follow instructions from your health care provider about hydration, which may include:  Up to 2 hours before the procedure - you may continue to drink clear liquids, such as water, clear fruit juice, black coffee, and plain tea.  Eating and drinking restrictions Follow instructions from your health care provider about eating and drinking, which may include:  8 hours before the procedure - stop eating heavy meals or foods, such as meat, fried foods, or fatty foods.  6 hours before  the procedure - stop eating light meals or foods, such as toast or cereal.  2 hours before the procedure - stop drinking clear liquids. Medicines Ask your health care provider about:  Changing or stopping your regular medicines. This is especially important if you are taking diabetes medicines or blood thinners.  Taking medicines such as aspirin and ibuprofen. These medicines can thin your blood. Do not take these medicines unless your health care provider tells you to take them. ? Generally, aspirin is recommended before a thin tube, called a catheter, is passed through a blood vessel and inserted into the heart (cardiac catheterization).  Taking over-the-counter medicines, vitamins, herbs, and supplements. General instructions  Do not use any products that contain nicotine or tobacco for at least 4 weeks before the procedure. These products include cigarettes, e-cigarettes, and chewing tobacco. If you need help quitting, ask your health care provider.  Plan to have someone take you home from the hospital or clinic.  If you will be going home right after the procedure, plan to have someone with you for 24 hours.  You may have tests and imaging procedures.  Ask your health  care provider: ? How your insertion site will be marked. Ask which artery will be used for the procedure. ? What steps will be taken to help prevent infection. These may include:  Removing hair at the insertion site.  Washing skin with a germ-killing soap.  Taking antibiotic medicine. What happens during the procedure?   An IV will be inserted into one of your veins.  Electrodes may be placed on your chest to monitor your heart rate during the procedure.  You will be given one or more of the following: ? A medicine to help you relax (sedative). ? A medicine to numb the area (local anesthetic) for catheter insertion.  A small incision will be made for catheter insertion.  The catheter will be inserted  into an artery using a guide wire. The location may be in your groin, your wrist, or the fold of your arm (near your elbow).  An X-ray procedure (fluoroscopy) will be used to help guide the catheter to the opening of the heart arteries.  A dye will be injected into the catheter. X-rays will be taken. The dye helps to show where any narrowing or blockages are located in the arteries.  Tell your health care provider if you have chest pain or trouble breathing.  A tiny wire will be guided to the blocked spot, and a balloon will be inflated to make the artery wider.  The stent will be expanded to crush the plaques into the wall of the vessel. The stent will hold the area open and improve the blood flow. Most stents have a drug coating to reduce the risk of the stent narrowing over time.  The artery may be made wider using a drill, laser, or other tools that remove plaques.  The catheter will be removed when the blood flow improves. The stent will stay where it was placed, and the lining of the artery will grow over it.  A bandage (dressing) will be placed on the insertion site. Pressure will be applied to stop bleeding.  The IV will be removed. This procedure may vary among health care providers and hospitals. What happens after the procedure?  Your blood pressure, heart rate, breathing rate, and blood oxygen level will be monitored until you leave the hospital or clinic.  If the procedure is done through the leg, you will lie flat in bed for a few hours or for as long as told by your health care provider. You will be instructed not to bend or cross your legs.  The insertion site and the pulse in your foot or wrist will be checked often.  You may have more blood tests, X-rays, and a test that records the electrical activity of your heart (electrocardiogram, or ECG).  Do not drive for 24 hours if you were given a sedative during your procedure. Summary  Coronary angiogram with stent  placement is a procedure to widen or open a narrowed coronary artery. This is done to treat heart problems.  Before the procedure, let your health care provider know about all the medical conditions and surgeries you have or have had.  This is a safe procedure. However, some problems may occur, including damage to nearby structures or organs, bleeding, blood clots, or allergies.  Follow your health care provider's instructions about eating, drinking, medicines, and other lifestyle changes, such as quitting tobacco use before the procedure. This information is not intended to replace advice given to you by your health care provider. Make sure you discuss  any questions you have with your health care provider. Document Revised: 12/25/2018 Document Reviewed: 12/25/2018 Elsevier Patient Education  [place].

## 2020-06-23 NOTE — H&P (View-Only) (Signed)
Cardiology Office Note:    Date:  06/23/2020   ID:  Joe [name], DOB 06-14-1971, MRN [id number]  PCP:  Cyndi Bender, PA-C  Cardiologist:  Jenne Campus, MD  Electrophysiologist:  Constance Haw, MD   Referring MD: Cyndi Bender, PA-C   Chief Complaint  Patient presents with  . Chest Pain    Palpitations     . Palpitations  . Fatigue    History of Present Illness:    Joe [name] is a 50 y.o. male with a hx of coronary artery disease status post stents in the LAD in 2019, recent left heart catheterization in January 2020 showed 40% stenosis in the LAD, mid RCA 25%, PDA 50% stenosis, ischemic cardiomyopathy in September 2020 EF 20 to 25% status post CRT-D, heart failure with reduced ejection fraction, hypertension, hyperlipidemia.  The patient follows with Dr. Agustin Cree and was last seen by him in November 2021 at that time he was continued on his mexiletine for frequent PVCs his carvedilol was increased his Lasix was cut back.    The patient requested to be seen today as over the last week he has been experiencing intermittent chest pain.  He describes it as a pressure-like sensation which is midsternal and sometimes radiates to his chest.  He has not tried his nitroglycerin.  But he notes that there is some familiarity with his first event where he felt as if elephant was sitting on his chest this time he feels that there is still an elephant but not as heavy as prior.  There has been some intermittent shortness of breath which is associated.  As well as a great deal of fatigue.  He notes palpitations but he tells me this is not new.  Past Medical History:  Diagnosis Date  . Abnormal EKG   . Acute systolic heart failure ([place]) 09/06/2017  . Angina pectoris ([place]) 09/05/2017  . Body mass index 45.0-49.9, adult ([place])   . CAD S/P percutaneous coronary angioplasty 09/07/2017   mLAD and dLAD PCI with DES 09/06/17  . CHF (congestive heart failure) ([place])   . Chronic systolic  (congestive) heart failure ([place]) 03/19/2019  . Complication of anesthesia 1995   "had hard time waking up; breathing w/vasectomy"  . Coronary artery disease   . Erectile dysfunction   . Essential hypertension, benign   . Fatigue   . GERD (gastroesophageal reflux disease)   . Gout   . High cholesterol    "just started tx yesterday" (09/06/2017)  . History of gout    "RX prn" (09/06/2017)  . Ischemic cardiomyopathy 09/07/2017   EF 25-35%  . Muscular chest pain   . 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 Obesity   . Obstructive sleep apnea   . OSA on CPAP   . Plantar fasciitis   . Pre-operative clearance 02/11/2018  . 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/02/2019   [facility]  . Right bundle branch block   . Shortness of breath 09/05/2017  . Testosterone deficiency   . Type 2 diabetes mellitus with complication, without long-term current use of insulin ([place]) 09/05/2017  . Type II diabetes mellitus ([place])    "started tx 08/28/2017"    Past Surgical History:  Procedure Laterality Date  . BACK SURGERY    . BIV ICD INSERTION CRT-D N/A 03/19/2019   Procedure: BIV ICD INSERTION CRT-D;  Surgeon: Constance Haw, MD;  Location: [facility];  Service: Cardiovascular;  Laterality: N/A;  . CORONARY ANGIOPLASTY WITH STENT PLACEMENT  09/06/2017   "3 stents"  . CORONARY STENT INTERVENTION N/A 09/06/2017   Procedure: CORONARY STENT INTERVENTION;  Surgeon: Jettie Booze, MD;  Location: [place] [facility];  Service: Cardiovascular;  Laterality: N/A;  . CORONARY STENT INTERVENTION N/A 02/15/2018   Procedure: CORONARY STENT INTERVENTION;  Surgeon: Jettie Booze, MD;  Location: [place] [facility];  Service: Cardiovascular;  Laterality: N/A;  . KNEE ARTHROSCOPY Right   . LEFT HEART CATH AND CORONARY ANGIOGRAPHY N/A 09/06/2017   Procedure: LEFT HEART CATH AND CORONARY ANGIOGRAPHY;  Surgeon: Jettie Booze, MD;   Location: [place] [facility];  Service: Cardiovascular;  Laterality: N/A;  . LEFT HEART CATH AND CORONARY ANGIOGRAPHY N/A 02/15/2018   Procedure: LEFT HEART CATH AND CORONARY ANGIOGRAPHY;  Surgeon: Jettie Booze, MD;  Location: [facility] [facility];  Service: Cardiovascular;  Laterality: N/A;  . LEFT HEART CATH AND CORONARY ANGIOGRAPHY N/A 06/27/2018   Procedure: LEFT HEART CATH AND CORONARY ANGIOGRAPHY;  Surgeon: Jettie Booze, MD;  Location: [place] [facility];  Service: Cardiovascular;  Laterality: N/A;  . LUMBAR SPINE SURGERY  ~ 2001   Intradiscal Electrothermoplasty  . VASECTOMY  1995    Current Medications: Current Meds  Medication Sig  . acetaminophen (TYLENOL) 500 MG tablet Take 1,000 mg by mouth every 6 (six) hours as needed for moderate pain or headache.  . allopurinol (ZYLOPRIM) 100 MG tablet Take 100 mg by mouth daily as needed (for gout).   Marland Kitchen aspirin EC 81 MG tablet Take 1 tablet (81 mg total) by mouth daily.  . carvedilol (COREG) 12.5 MG tablet Take 1 and 1/2 tablet twice daily.  . clopidogrel (PLAVIX) 75 MG tablet TAKE 1 TABLET (75 MG TOTAL) BY MOUTH AT BEDTIME.  . colchicine 0.6 MG tablet Take 0.6 mg by mouth daily.  . dapagliflozin propanediol (FARXIGA) 10 MG TABS tablet Take 1 tablet (10 mg total) by mouth daily. Needs appt  . digoxin (LANOXIN) 0.125 MG tablet Take 1 tablet (0.125 mg total) by mouth daily.  Marland Kitchen ENTRESTO 97-103 MG TAKE 1 TABLET BY MOUTH TWICE A DAY  . furosemide (LASIX) 40 MG tablet Take 1 tablet (40 mg total) by mouth daily.  . halobetasol (ULTRAVATE) 0.05 % cream APPLY TO AFFECTED AREA EVERY DAY AT BEDTIME  . ibuprofen (ADVIL) 200 MG tablet Take 800 mg by mouth every 8 (eight) hours as needed for headache or moderate pain.  . indomethacin (INDOCIN) 50 MG capsule TAKE 1 CAPSULE 3 TIMES A DAY AS NEEDED FOR GOUT FLARE SYMPTOMS  . isosorbide mononitrate (IMDUR) 30 MG 24 hr tablet Take 1 tablet (30 mg total) by mouth daily.  . metFORMIN  (GLUCOPHAGE-XR) 500 MG 24 hr tablet Take 1,000 mg by mouth in the morning and at bedtime.   . mexiletine (MEXITIL) 200 MG capsule TAKE 1 CAPSULE BY MOUTH TWICE A DAY  . nitroGLYCERIN (NITROSTAT) 0.4 MG SL tablet Place 1 tablet (0.4 mg total) under the tongue every 5 (five) minutes as needed for chest pain.  Marland Kitchen omeprazole (PRILOSEC) 40 MG capsule Take 1 capsule (40 mg total) by mouth daily.  . ondansetron (ZOFRAN ODT) 4 MG disintegrating tablet 4mg  ODT q4 hours prn nausea/vomit  . Polyethylene Glycol 400 (BLINK TEARS OP) Place 2 drops into both eyes 2 (two) times daily as needed (for dry eyes).  . Potassium Chloride ER 20 MEQ TBCR TAKE 1 TABLET BY MOUTH EVERY DAY  . ranolazine (RANEXA)  500 MG 12 hr tablet TAKE 1 TABLET BY MOUTH TWICE A DAY  . rosuvastatin (CRESTOR) 40 MG tablet TAKE 1 TABLET BY MOUTH EVERY DAY IN THE EVENING  . spironolactone (ALDACTONE) 25 MG tablet Take 25 mg by mouth 2 (two) times daily.     Allergies:   Patient has no known allergies.   Social History   Socioeconomic History  . Marital status: Married    Spouse name: Not on file  . Number of children: Not on file  . Years of education: Not on file  . Highest education level: Not on file  Occupational History  . Not on file  Tobacco Use  . Smoking status: Never Smoker  . Smokeless tobacco: Never Used  Vaping Use  . Vaping Use: Never used  Substance and Sexual Activity  . Alcohol use: No  . Drug use: Never  . Sexual activity: Not on file  Other Topics Concern  . Not on file  Social History Narrative  . Not on file   Social Determinants of Health   Financial Resource Strain: Not on file  Food Insecurity: Not on file  Transportation Needs: Not on file  Physical Activity: Not on file  Stress: Not on file  Social Connections: Not on file     Family History: The patient's family history includes Brain cancer in his mother; Diabetes Mellitus II in his brother and paternal grandfather; Lung cancer in his  mother; Lymphoma in his father.  ROS:   Review of Systems  Constitution: Negative for decreased appetite, fever and weight gain.  HENT: Negative for congestion, ear discharge, hoarse voice and sore throat.   Eyes: Negative for discharge, redness, vision loss in right eye and visual halos.  Cardiovascular: Negative for chest pain, dyspnea on exertion, leg swelling, orthopnea and palpitations.  Respiratory: Negative for cough, hemoptysis, shortness of breath and snoring.   Endocrine: Negative for heat intolerance and polyphagia.  Hematologic/Lymphatic: Negative for bleeding problem. Does not bruise/bleed easily.  Skin: Negative for flushing, nail changes, rash and suspicious lesions.  Musculoskeletal: Negative for arthritis, joint pain, muscle cramps, myalgias, neck pain and stiffness.  Gastrointestinal: Negative for abdominal pain, bowel incontinence, diarrhea and excessive appetite.  Genitourinary: Negative for decreased libido, genital sores and incomplete emptying.  Neurological: Negative for brief paralysis, focal weakness, headaches and loss of balance.  Psychiatric/Behavioral: Negative for altered mental status, depression and suicidal ideas.  Allergic/Immunologic: Negative for HIV exposure and persistent infections.    EKGs/Labs/Other Studies Reviewed:    The following studies were reviewed today:   EKG:  The ekg ordered today demonstrates atrial synchrony with ventricular paced rhythm and frequent PVCs.  Left heart catheterization January 2020   Dist LAD lesion is 40% stenosed. Diffusely diseased apical LAD.  LAD stents widely patent.  Previously placed Mid Cx drug eluting stent is widely patent.  Previously placed Prox Cx drug eluting stent is widely patent.  Mid RCA lesion is 25% stenosed.  Ost RPDA to RPDA lesion is 50% stenosed.  Dist RCA lesion is 25% stenosed with 25% stenosed side branch in Post Atrio.  LV end diastolic pressure is mildly elevated. LVEDP 19  mm Hg.  There is no aortic valve stenosis.   Patent stents in the left circumflex and LAD.    Echocardiogram in August 2021 1. Left ventricular ejection fraction, by estimation, is 25 to 30%. The  left ventricle has severely decreased function. The left ventricle  demonstrates global hypokinesis. Left ventricular diastolic parameters are  indeterminate.  2. Right ventricular systolic function is normal. The right ventricular  size is normal. There is mildly elevated pulmonary artery systolic  pressure.  3. The mitral valve was not well visualized. No evidence of mitral valve  regurgitation. No evidence of mitral stenosis.  4. The aortic valve was not well visualized. Aortic valve regurgitation  is not visualized. No aortic stenosis is present.  5. The inferior vena cava is normal in size with greater than 50%  respiratory variability, suggesting right atrial pressure of 3 mmHg.    Recent Labs: 12/24/2019: BUN 11; Creatinine, Ser 1.20; Potassium 4.4; Sodium 139  Recent Lipid Panel    Component Value Date/Time   CHOL 124 03/27/2018 0940   TRIG 218 (H) 03/27/2018 0940   HDL 35 (L) 03/27/2018 0940   CHOLHDL 3.5 03/27/2018 0940   LDLCALC 45 03/27/2018 0940    Physical Exam:    VS:  BP 100/70 (BP Location: Right Arm, Patient Position: Sitting)   Pulse 85   Ht 6\' 3"  (1.905 m)   Wt (!) 357 lb (161.9 kg)   SpO2 91%   BMI 44.62 kg/m     Wt Readings from Last 3 Encounters:  06/23/20 (!) 357 lb (161.9 kg)  05/05/20 (!) 360 lb (163.3 kg)  12/24/19 (!) 364 lb 6.4 oz (165.3 kg)     GEN: Well nourished, well developed in no acute distress HEENT: Normal NECK: No JVD; No carotid bruits LYMPHATICS: No lymphadenopathy CARDIAC: S1S2 noted,RRR, no murmurs, rubs, gallops RESPIRATORY:  Clear to auscultation without rales, wheezing or rhonchi  ABDOMEN: Soft, non-tender, non-distended, +bowel sounds, no guarding. EXTREMITIES: No edema, No cyanosis, no clubbing MUSCULOSKELETAL:  No  deformity  SKIN: Warm and dry NEUROLOGIC:  Alert and oriented x 3, non-focal PSYCHIATRIC:  Normal affect, good insight  ASSESSMENT:    1. Chest pain, unspecified type   2. CAD S/P percutaneous coronary angioplasty   3. Essential hypertension, benign   4. Ischemic cardiomyopathy   5. Chronic systolic (congestive) heart failure (HCC)   6. Type 2 diabetes mellitus with complication, without long-term current use of insulin (HCC)    PLAN:    Coronary artery disease-continue patient on his dual antiplatelet therapy with Plavix and aspirin. I am concerned for his chest pain which could be angina he is on multiple antianginals including Imdur as well as Ranexa.  I am going to increase his Ranexa to 1000 mg twice a day.  In addition he has high risk factors for progression of his coronary artery disease therefore a left heart catheterization will be appropriate at this time.  The patient understands that risks include but are not limited to stroke (1 in 1000), death (1 in 51), kidney failure [usually temporary] (1 in 500), bleeding (1 in 200), allergic reaction [possibly serious] (1 in 200), and agrees to proceed.  Reviewed his lipid profile in June 2021 which showed LDL of 67 ideally he should be less than 50 he is on rosuvastatin 40 mg daily I am going to add Zetia 10 mg hopefully this will help [place] reach our goal.  His blood pressure deceptively in the office no changes will be made to his antihypertensive regimen.  Diabetes mellitus this is being treated by his primary care doctor continue his current regimen.  Frequent PVCs he is on mexiletine as well as beta-blocker he notes there is still some intermittent palpitation however has improved  Ischemic cardiomyopathy recent EF 25 to 30% an echo in August 2021 continue his  current medication regimen which includes Entresto 97-103 mg, Farxiga 10 mg daily, carvedilol 12.5 mg twice a day, and Aldactone.  Morbid obesity-the patient understands  the need to lose weight with diet and exercise. We have discussed specific strategies for this.   The patient is in agreement with the above plan. The patient left the office in stable condition.  The patient will follow up in 2 weeks postcatheterization with Dr. Marybelle Killings.   Medication Adjustments/Labs and Tests Ordered: Current medicines are reviewed at length with the patient today.  Concerns regarding medicines are outlined above.  Orders Placed This Encounter  Procedures  . EKG 12-Lead   No orders of the defined types were placed in this encounter.   There are no Patient Instructions on file for this visit.   Adopting a Healthy Lifestyle.  Know what a healthy weight is for you (roughly BMI <25) and aim to maintain this   Aim for 7+ servings of fruits and vegetables daily   65-80+ fluid ounces of water or unsweet tea for healthy kidneys   Limit to max 1 drink of alcohol per day; avoid smoking/tobacco   Limit animal fats in diet for cholesterol and heart health - choose grass fed whenever available   Avoid highly processed foods, and foods high in saturated/trans fats   Aim for low stress - take time to unwind and care for your mental health   Aim for 150 min of moderate intensity exercise weekly for heart health, and weights twice weekly for bone health   Aim for 7-9 hours of sleep daily   When it comes to diets, agreement about the perfect plan isnt easy to find, even among the experts. Experts at the [facility] of [employer] developed an idea known as the Healthy Eating Plate. Just imagine a plate divided into logical, healthy portions.   The emphasis is on diet quality:   Load up on vegetables and fruits - one-half of your plate: Aim for color and variety, and remember that potatoes dont count.   Go for whole grains - one-quarter of your plate: Whole wheat, barley, wheat berries, quinoa, oats, brown rice, and foods made with them. If you want pasta, go with  whole wheat pasta.   Protein power - one-quarter of your plate: Fish, chicken, beans, and nuts are all healthy, versatile protein sources. Limit red meat.   The diet, however, does go beyond the plate, offering a few other suggestions.   Use healthy plant oils, such as olive, canola, soy, corn, sunflower and peanut. Check the labels, and avoid partially hydrogenated oil, which have unhealthy trans fats.   If youre thirsty, drink water. Coffee and tea are good in moderation, but skip sugary drinks and limit milk and dairy products to one or two daily servings.   The type of carbohydrate in the diet is more important than the amount. Some sources of carbohydrates, such as vegetables, fruits, whole grains, and beans-are healthier than others.   Finally, stay active  Signed, Thomasene Ripple, DO  06/23/2020 9:15 AM    [facility]

## 2020-06-23 NOTE — Telephone Encounter (Signed)
Patient was seen today.

## 2020-06-23 NOTE — Progress Notes (Signed)
Cardiology Office Note:    Date:  06/23/2020   ID:  Joe [name], DOB [birth year], MRN [id number]  PCP:  Joe Bender, PA-C  Cardiologist:  Joe Campus, MD  Electrophysiologist:  Joe Haw, MD   Referring MD: Joe Bender, PA-C   Chief Complaint  Patient presents with  . Chest Pain    Palpitations     . Palpitations  . Fatigue    History of Present Illness:    Joe [name] is a 50 y.o. male with a hx of coronary artery disease status post stents in the LAD in 2019, recent left heart catheterization in January 2020 showed 40% stenosis in the LAD, mid RCA 25%, PDA 50% stenosis, ischemic cardiomyopathy in September 2020 EF 20 to 25% status post CRT-D, heart failure with reduced ejection fraction, hypertension, hyperlipidemia.  The patient follows with Dr. Agustin [name] and was last seen by him in November 2021 at that time he was continued on his mexiletine for frequent PVCs his carvedilol was increased his Lasix was cut back.    The patient requested to be seen today as over the last week he has been experiencing intermittent chest pain.  He describes it as a pressure-like sensation which is midsternal and sometimes radiates to his chest.  He has not tried his nitroglycerin.  But he notes that there is some familiarity with his first event where he felt as if elephant was sitting on his chest this time he feels that there is still an elephant but not as heavy as prior.  There has been some intermittent shortness of breath which is associated.  As well as a great deal of fatigue.  He notes palpitations but he tells me this is not new.  Past Medical History:  Diagnosis Date  . Abnormal EKG   . Acute systolic heart failure ([place]) 09/06/2017  . Angina pectoris ([place]) 09/05/2017  . Body mass index 45.0-49.9, adult ([place])   . CAD S/P percutaneous coronary angioplasty 09/07/2017   mLAD and dLAD PCI with DES 09/06/17  . CHF (congestive heart failure) ([place])   . Chronic systolic  (congestive) heart failure ([place]) 03/19/2019  . Complication of anesthesia 1995   "had hard time waking up; breathing w/vasectomy"  . Coronary artery disease   . Erectile dysfunction   . Essential hypertension, benign   . Fatigue   . GERD (gastroesophageal reflux disease)   . Gout   . High cholesterol    "just started tx yesterday" (09/06/2017)  . History of gout    "RX prn" (09/06/2017)  . Ischemic cardiomyopathy 09/07/2017   EF 25-35%  . Muscular chest pain   . 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 Obesity   . Obstructive sleep apnea   . OSA on CPAP   . Plantar fasciitis   . Pre-operative clearance 02/11/2018  . 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/02/2019   [facility]  . Right bundle branch block   . Shortness of breath 09/05/2017  . Testosterone deficiency   . Type 2 diabetes mellitus with complication, without long-term current use of insulin ([place]) 09/05/2017  . Type II diabetes mellitus ([place])    "started tx 08/28/2017"    Past Surgical History:  Procedure Laterality Date  . BACK SURGERY    . BIV ICD INSERTION CRT-D N/A 03/19/2019   Procedure: BIV ICD INSERTION CRT-D;  Surgeon: Joe Haw, MD;  Location: [facility];  Service: Cardiovascular;  Laterality: N/A;  . CORONARY ANGIOPLASTY WITH STENT PLACEMENT  09/06/2017   "3 stents"  . CORONARY STENT INTERVENTION N/A 09/06/2017   Procedure: CORONARY STENT INTERVENTION;  Surgeon: Jettie Booze, MD;  Location: [facility];  Service: Cardiovascular;  Laterality: N/A;  . CORONARY STENT INTERVENTION N/A 02/15/2018   Procedure: CORONARY STENT INTERVENTION;  Surgeon: Jettie Booze, MD;  Location: [facility];  Service: Cardiovascular;  Laterality: N/A;  . KNEE ARTHROSCOPY Right   . LEFT HEART CATH AND CORONARY ANGIOGRAPHY N/A 09/06/2017   Procedure: LEFT HEART CATH AND CORONARY ANGIOGRAPHY;  Surgeon: Jettie Booze, MD;   Location: [facility];  Service: Cardiovascular;  Laterality: N/A;  . LEFT HEART CATH AND CORONARY ANGIOGRAPHY N/A 02/15/2018   Procedure: LEFT HEART CATH AND CORONARY ANGIOGRAPHY;  Surgeon: Jettie Booze, MD;  Location: [facility];  Service: Cardiovascular;  Laterality: N/A;  . LEFT HEART CATH AND CORONARY ANGIOGRAPHY N/A 06/27/2018   Procedure: LEFT HEART CATH AND CORONARY ANGIOGRAPHY;  Surgeon: Jettie Booze, MD;  Location: [facility];  Service: Cardiovascular;  Laterality: N/A;  . LUMBAR SPINE SURGERY  ~ 2001   Intradiscal Electrothermoplasty  . VASECTOMY  1995    Current Medications: Current Meds  Medication Sig  . acetaminophen (TYLENOL) 500 MG tablet Take 1,000 mg by mouth every 6 (six) hours as needed for moderate pain or headache.  . allopurinol (ZYLOPRIM) 100 MG tablet Take 100 mg by mouth daily as needed (for gout).   Marland Kitchen aspirin EC 81 MG tablet Take 1 tablet (81 mg total) by mouth daily.  . carvedilol (COREG) 12.5 MG tablet Take 1 and 1/2 tablet twice daily.  . clopidogrel (PLAVIX) 75 MG tablet TAKE 1 TABLET (75 MG TOTAL) BY MOUTH AT BEDTIME.  . colchicine 0.6 MG tablet Take 0.6 mg by mouth daily.  . dapagliflozin propanediol (FARXIGA) 10 MG TABS tablet Take 1 tablet (10 mg total) by mouth daily. Needs appt  . digoxin (LANOXIN) 0.125 MG tablet Take 1 tablet (0.125 mg total) by mouth daily.  Marland Kitchen ENTRESTO 97-103 MG TAKE 1 TABLET BY MOUTH TWICE A DAY  . furosemide (LASIX) 40 MG tablet Take 1 tablet (40 mg total) by mouth daily.  . halobetasol (ULTRAVATE) 0.05 % cream APPLY TO AFFECTED AREA EVERY DAY AT BEDTIME  . ibuprofen (ADVIL) 200 MG tablet Take 800 mg by mouth every 8 (eight) hours as needed for headache or moderate pain.  . indomethacin (INDOCIN) 50 MG capsule TAKE 1 CAPSULE 3 TIMES A DAY AS NEEDED FOR GOUT FLARE SYMPTOMS  . isosorbide mononitrate (IMDUR) 30 MG 24 hr tablet Take 1 tablet (30 mg total) by mouth daily.  . metFORMIN  (GLUCOPHAGE-XR) 500 MG 24 hr tablet Take 1,000 mg by mouth in the morning and at bedtime.   . mexiletine (MEXITIL) 200 MG capsule TAKE 1 CAPSULE BY MOUTH TWICE A DAY  . nitroGLYCERIN (NITROSTAT) 0.4 MG SL tablet Place 1 tablet (0.4 mg total) under the tongue every 5 (five) minutes as needed for chest pain.  Marland Kitchen omeprazole (PRILOSEC) 40 MG capsule Take 1 capsule (40 mg total) by mouth daily.  . ondansetron (ZOFRAN ODT) 4 MG disintegrating tablet 4mg  ODT q4 hours prn nausea/vomit  . Polyethylene Glycol 400 (BLINK TEARS OP) Place 2 drops into both eyes 2 (two) times daily as needed (for dry eyes).  . Potassium Chloride ER 20 MEQ TBCR TAKE 1 TABLET BY MOUTH EVERY DAY  . ranolazine (RANEXA)  500 MG 12 hr tablet TAKE 1 TABLET BY MOUTH TWICE A DAY  . rosuvastatin (CRESTOR) 40 MG tablet TAKE 1 TABLET BY MOUTH EVERY DAY IN THE EVENING  . spironolactone (ALDACTONE) 25 MG tablet Take 25 mg by mouth 2 (two) times daily.     Allergies:   Patient has no known allergies.   Social History   Socioeconomic History  . Marital status: Married    Spouse name: Not on file  . Number of children: Not on file  . Years of education: Not on file  . Highest education level: Not on file  Occupational History  . Not on file  Tobacco Use  . Smoking status: Never Smoker  . Smokeless tobacco: Never Used  Vaping Use  . Vaping Use: Never used  Substance and Sexual Activity  . Alcohol use: No  . Drug use: Never  . Sexual activity: Not on file  Other Topics Concern  . Not on file  Social History Narrative  . Not on file   Social Determinants of Health   Financial Resource Strain: Not on file  Food Insecurity: Not on file  Transportation Needs: Not on file  Physical Activity: Not on file  Stress: Not on file  Social Connections: Not on file     Family History: The patient's family history includes Brain cancer in his mother; Diabetes Mellitus II in his brother and paternal grandfather; Lung cancer in his  mother; Lymphoma in his father.  ROS:   Review of Systems  Constitution: Negative for decreased appetite, fever and weight gain.  HENT: Negative for congestion, ear discharge, hoarse voice and sore throat.   Eyes: Negative for discharge, redness, vision loss in right eye and visual halos.  Cardiovascular: Negative for chest pain, dyspnea on exertion, leg swelling, orthopnea and palpitations.  Respiratory: Negative for cough, hemoptysis, shortness of breath and snoring.   Endocrine: Negative for heat intolerance and polyphagia.  Hematologic/Lymphatic: Negative for bleeding problem. Does not bruise/bleed easily.  Skin: Negative for flushing, nail changes, rash and suspicious lesions.  Musculoskeletal: Negative for arthritis, joint pain, muscle cramps, myalgias, neck pain and stiffness.  Gastrointestinal: Negative for abdominal pain, bowel incontinence, diarrhea and excessive appetite.  Genitourinary: Negative for decreased libido, genital sores and incomplete emptying.  Neurological: Negative for brief paralysis, focal weakness, headaches and loss of balance.  Psychiatric/Behavioral: Negative for altered mental status, depression and suicidal ideas.  Allergic/Immunologic: Negative for HIV exposure and persistent infections.    EKGs/Labs/Other Studies Reviewed:    The following studies were reviewed today:   EKG:  The ekg ordered today demonstrates atrial synchrony with ventricular paced rhythm and frequent PVCs.  Left heart catheterization January 2020   Dist LAD lesion is 40% stenosed. Diffusely diseased apical LAD.  LAD stents widely patent.  Previously placed Mid Cx drug eluting stent is widely patent.  Previously placed Prox Cx drug eluting stent is widely patent.  Mid RCA lesion is 25% stenosed.  Ost RPDA to RPDA lesion is 50% stenosed.  Dist RCA lesion is 25% stenosed with 25% stenosed side branch in Post Atrio.  LV end diastolic pressure is mildly elevated. LVEDP 19  mm Hg.  There is no aortic valve stenosis.   Patent stents in the left circumflex and LAD.    Echocardiogram in August 2021 1. Left ventricular ejection fraction, by estimation, is 25 to 30%. The  left ventricle has severely decreased function. The left ventricle  demonstrates global hypokinesis. Left ventricular diastolic parameters are  indeterminate.  2. Right ventricular systolic function is normal. The right ventricular  size is normal. There is mildly elevated pulmonary artery systolic  pressure.  3. The mitral valve was not well visualized. No evidence of mitral valve  regurgitation. No evidence of mitral stenosis.  4. The aortic valve was not well visualized. Aortic valve regurgitation  is not visualized. No aortic stenosis is present.  5. The inferior vena cava is normal in size with greater than 50%  respiratory variability, suggesting right atrial pressure of 3 mmHg.    Recent Labs: 12/24/2019: BUN 11; Creatinine, Ser 1.20; Potassium 4.4; Sodium 139  Recent Lipid Panel    Component Value Date/Time   CHOL 124 03/27/2018 0940   TRIG 218 (H) 03/27/2018 0940   HDL 35 (L) 03/27/2018 0940   CHOLHDL 3.5 03/27/2018 0940   LDLCALC 45 03/27/2018 0940    Physical Exam:    VS:  BP 100/70 (BP Location: Right Arm, Patient Position: Sitting)   Pulse 85   Ht 6\' 3"  (1.905 m)   Wt (!) 357 lb (161.9 kg)   SpO2 91%   BMI 44.62 kg/m     Wt Readings from Last 3 Encounters:  06/23/20 (!) 357 lb (161.9 kg)  05/05/20 (!) 360 lb (163.3 kg)  12/24/19 (!) 364 lb 6.4 oz (165.3 kg)     GEN: Well nourished, well developed in no acute distress HEENT: Normal NECK: No JVD; No carotid bruits LYMPHATICS: No lymphadenopathy CARDIAC: S1S2 noted,RRR, no murmurs, rubs, gallops RESPIRATORY:  Clear to auscultation without rales, wheezing or rhonchi  ABDOMEN: Soft, non-tender, non-distended, +bowel sounds, no guarding. EXTREMITIES: No edema, No cyanosis, no clubbing MUSCULOSKELETAL:  No  deformity  SKIN: Warm and dry NEUROLOGIC:  Alert and oriented x 3, non-focal PSYCHIATRIC:  Normal affect, good insight  ASSESSMENT:    1. Chest pain, unspecified type   2. CAD S/P percutaneous coronary angioplasty   3. Essential hypertension, benign   4. Ischemic cardiomyopathy   5. Chronic systolic (congestive) heart failure (HCC)   6. Type 2 diabetes mellitus with complication, without long-term current use of insulin (HCC)    PLAN:    Coronary artery disease-continue patient on his dual antiplatelet therapy with Plavix and aspirin. I am concerned for his chest pain which could be angina he is on multiple antianginals including Imdur as well as Ranexa.  I am going to increase his Ranexa to 1000 mg twice a day.  In addition he has high risk factors for progression of his coronary artery disease therefore a left heart catheterization will be appropriate at this time.  The patient understands that risks include but are not limited to stroke (1 in 1000), death (1 in 51), kidney failure [usually temporary] (1 in 500), bleeding (1 in 200), allergic reaction [possibly serious] (1 in 200), and agrees to proceed.  Reviewed his lipid profile in June 2021 which showed LDL of 67 ideally he should be less than 50 he is on rosuvastatin 40 mg daily I am going to add Zetia 10 mg hopefully this will help [place] reach our goal.  His blood pressure deceptively in the office no changes will be made to his antihypertensive regimen.  Diabetes mellitus this is being treated by his primary care doctor continue his current regimen.  Frequent PVCs he is on mexiletine as well as beta-blocker he notes there is still some intermittent palpitation however has improved  Ischemic cardiomyopathy recent EF 25 to 30% an echo in August 2021 continue his  current medication regimen which includes Entresto 97-103 mg, Farxiga 10 mg daily, carvedilol 12.5 mg twice a day, and Aldactone.  Morbid obesity-the patient understands  the need to lose weight with diet and exercise. We have discussed specific strategies for this.   The patient is in agreement with the above plan. The patient left the office in stable condition.  The patient will follow up in 2 weeks postcatheterization with Dr. Marybelle Killings.   Medication Adjustments/Labs and Tests Ordered: Current medicines are reviewed at length with the patient today.  Concerns regarding medicines are outlined above.  Orders Placed This Encounter  Procedures  . EKG 12-Lead   No orders of the defined types were placed in this encounter.   There are no Patient Instructions on file for this visit.   Adopting a Healthy Lifestyle.  Know what a healthy weight is for you (roughly BMI <25) and aim to maintain this   Aim for 7+ servings of fruits and vegetables daily   65-80+ fluid ounces of water or unsweet tea for healthy kidneys   Limit to max 1 drink of alcohol per day; avoid smoking/tobacco   Limit animal fats in diet for cholesterol and heart health - choose grass fed whenever available   Avoid highly processed foods, and foods high in saturated/trans fats   Aim for low stress - take time to unwind and care for your mental health   Aim for 150 min of moderate intensity exercise weekly for heart health, and weights twice weekly for bone health   Aim for 7-9 hours of sleep daily   When it comes to diets, agreement about the perfect plan isnt easy to find, even among the experts. Experts at the [facility] of [employer] developed an idea known as the Healthy Eating Plate. Just imagine a plate divided into logical, healthy portions.   The emphasis is on diet quality:   Load up on vegetables and fruits - one-half of your plate: Aim for color and variety, and remember that potatoes dont count.   Go for whole grains - one-quarter of your plate: Whole wheat, barley, wheat berries, quinoa, oats, brown rice, and foods made with them. If you want pasta, go with  whole wheat pasta.   Protein power - one-quarter of your plate: Fish, chicken, beans, and nuts are all healthy, versatile protein sources. Limit red meat.   The diet, however, does go beyond the plate, offering a few other suggestions.   Use healthy plant oils, such as olive, canola, soy, corn, sunflower and peanut. Check the labels, and avoid partially hydrogenated oil, which have unhealthy trans fats.   If youre thirsty, drink water. Coffee and tea are good in moderation, but skip sugary drinks and limit milk and dairy products to one or two daily servings.   The type of carbohydrate in the diet is more important than the amount. Some sources of carbohydrates, such as vegetables, fruits, whole grains, and beans-are healthier than others.   Finally, stay active  Signed, Thomasene Ripple, DO  06/23/2020 9:15 AM    [facility]

## 2020-06-24 ENCOUNTER — Telehealth: Payer: Self-pay | Admitting: *Deleted

## 2020-06-24 LAB — CBC
Hematocrit: 47.1 % (ref 37.5–51.0)
Hemoglobin: 15.6 g/dL (ref 13.0–17.7)
MCH: 31.1 pg (ref 26.6–33.0)
MCHC: 33.1 g/dL (ref 31.5–35.7)
MCV: 94 fL (ref 79–97)
Platelets: 253 10*3/uL (ref 150–450)
RBC: 5.02 x10E6/uL (ref 4.14–5.80)
RDW: 13 % (ref 11.6–15.4)
WBC: 6.8 10*3/uL (ref 3.4–10.8)

## 2020-06-24 LAB — BASIC METABOLIC PANEL
BUN/Creatinine Ratio: 14 (ref 9–20)
BUN: 14 mg/dL (ref 6–24)
CO2: 21 mmol/L (ref 20–29)
Calcium: 9.1 mg/dL (ref 8.7–10.2)
Chloride: 101 mmol/L (ref 96–106)
Creatinine, Ser: 1.03 mg/dL (ref 0.76–1.27)
GFR calc Af Amer: 98 mL/min/{1.73_m2} (ref >59)
GFR calc non Af Amer: 85 mL/min/{1.73_m2} (ref >59)
Glucose: 149 mg/dL — ABNORMAL HIGH (ref 65–99)
Potassium: 4.5 mmol/L (ref 3.5–5.2)
Sodium: 140 mmol/L (ref 134–144)

## 2020-06-24 NOTE — Telephone Encounter (Signed)
Pt contacted pre-catheterization scheduled at [facility] for: Monday June 28, 2020 9 AM Verified arrival time and place: [facility] Main Entrance [facility]) at: 7 AM   No solid food after midnight prior to cath, clear liquids until 5 AM day of procedure.  Hold: Farxiga-AM of procedure Metformin-day of procedure and 48 hours post procedure Lasix/KCl-AM of procedure Spironolactone-AM of procedure  Except hold medications AM meds can be  taken pre-cath with sips of water including: ASA 81 mg Plavix 75 mg   Confirmed patient has responsible adult to drive home post procedure and be with patient first 24 hours after arriving home:yes  You are allowed ONE visitor in the waiting room during the time you are at the hospital for your procedure. Both you and your visitor must wear a mask once you enter the hospital.     Reviewed procedure/mas/visitor instructions with patient.

## 2020-06-25 ENCOUNTER — Other Ambulatory Visit (HOSPITAL_COMMUNITY)
Admission: RE | Admit: 2020-06-25 | Discharge: 2020-06-25 | Disposition: A | Discharge status: Home / Self Care | Payer: BC Managed Care – PPO | Source: Ambulatory Visit | Attending: Cardiology | Admitting: Cardiology

## 2020-06-25 DIAGNOSIS — Z6841 Body Mass Index (BMI) 40.0 and over, adult: Secondary | ICD-10-CM | POA: Diagnosis not present

## 2020-06-25 DIAGNOSIS — Z01812 Encounter for preprocedural laboratory examination: Secondary | ICD-10-CM | POA: Insufficient documentation

## 2020-06-25 DIAGNOSIS — Z20822 Contact with and (suspected) exposure to covid-19: Secondary | ICD-10-CM | POA: Insufficient documentation

## 2020-06-25 DIAGNOSIS — E119 Type 2 diabetes mellitus without complications: Secondary | ICD-10-CM | POA: Diagnosis not present

## 2020-06-25 DIAGNOSIS — I5022 Chronic systolic (congestive) heart failure: Secondary | ICD-10-CM | POA: Diagnosis not present

## 2020-06-25 DIAGNOSIS — I110 Hypertensive heart disease with heart failure: Secondary | ICD-10-CM | POA: Diagnosis not present

## 2020-06-25 DIAGNOSIS — Z79899 Other long term (current) drug therapy: Secondary | ICD-10-CM | POA: Diagnosis not present

## 2020-06-25 DIAGNOSIS — I25119 Atherosclerotic heart disease of native coronary artery with unspecified angina pectoris: Secondary | ICD-10-CM | POA: Diagnosis not present

## 2020-06-25 DIAGNOSIS — Z7982 Long term (current) use of aspirin: Secondary | ICD-10-CM | POA: Diagnosis not present

## 2020-06-25 DIAGNOSIS — Z7902 Long term (current) use of antithrombotics/antiplatelets: Secondary | ICD-10-CM | POA: Diagnosis not present

## 2020-06-25 DIAGNOSIS — Z955 Presence of coronary angioplasty implant and graft: Secondary | ICD-10-CM | POA: Diagnosis not present

## 2020-06-25 DIAGNOSIS — Z7984 Long term (current) use of oral hypoglycemic drugs: Secondary | ICD-10-CM | POA: Diagnosis not present

## 2020-06-25 DIAGNOSIS — I255 Ischemic cardiomyopathy: Secondary | ICD-10-CM | POA: Diagnosis not present

## 2020-06-25 LAB — SARS CORONAVIRUS 2 (TAT 6-24 HRS): SARS Coronavirus 2: NEGATIVE

## 2020-06-28 ENCOUNTER — Other Ambulatory Visit: Payer: Self-pay

## 2020-06-28 ENCOUNTER — Encounter (HOSPITAL_COMMUNITY): Payer: Self-pay | Admitting: Cardiology

## 2020-06-28 ENCOUNTER — Ambulatory Visit (HOSPITAL_COMMUNITY)
Admission: RE | Admit: 2020-06-28 | Discharge: 2020-06-28 | Disposition: A | Discharge status: Home / Self Care | Payer: BC Managed Care – PPO | Attending: Cardiology | Admitting: Cardiology

## 2020-06-28 ENCOUNTER — Ambulatory Visit (HOSPITAL_COMMUNITY)
Admission: RE | Disposition: A | Discharge status: Home / Self Care | Payer: Self-pay | Source: Home / Self Care | Attending: Cardiology

## 2020-06-28 DIAGNOSIS — I10 Essential (primary) hypertension: Secondary | ICD-10-CM | POA: Diagnosis present

## 2020-06-28 DIAGNOSIS — I209 Angina pectoris, unspecified: Secondary | ICD-10-CM | POA: Diagnosis present

## 2020-06-28 DIAGNOSIS — Z7982 Long term (current) use of aspirin: Secondary | ICD-10-CM | POA: Insufficient documentation

## 2020-06-28 DIAGNOSIS — I255 Ischemic cardiomyopathy: Secondary | ICD-10-CM | POA: Diagnosis present

## 2020-06-28 DIAGNOSIS — G4733 Obstructive sleep apnea (adult) (pediatric): Secondary | ICD-10-CM | POA: Diagnosis present

## 2020-06-28 DIAGNOSIS — Z7984 Long term (current) use of oral hypoglycemic drugs: Secondary | ICD-10-CM | POA: Insufficient documentation

## 2020-06-28 DIAGNOSIS — I110 Hypertensive heart disease with heart failure: Secondary | ICD-10-CM | POA: Insufficient documentation

## 2020-06-28 DIAGNOSIS — I5022 Chronic systolic (congestive) heart failure: Secondary | ICD-10-CM | POA: Diagnosis present

## 2020-06-28 DIAGNOSIS — Z79899 Other long term (current) drug therapy: Secondary | ICD-10-CM | POA: Insufficient documentation

## 2020-06-28 DIAGNOSIS — I25119 Atherosclerotic heart disease of native coronary artery with unspecified angina pectoris: Secondary | ICD-10-CM | POA: Insufficient documentation

## 2020-06-28 DIAGNOSIS — Z20822 Contact with and (suspected) exposure to covid-19: Secondary | ICD-10-CM | POA: Insufficient documentation

## 2020-06-28 DIAGNOSIS — Z6841 Body Mass Index (BMI) 40.0 and over, adult: Secondary | ICD-10-CM | POA: Insufficient documentation

## 2020-06-28 DIAGNOSIS — I2510 Atherosclerotic heart disease of native coronary artery without angina pectoris: Secondary | ICD-10-CM

## 2020-06-28 DIAGNOSIS — E119 Type 2 diabetes mellitus without complications: Secondary | ICD-10-CM

## 2020-06-28 DIAGNOSIS — Z955 Presence of coronary angioplasty implant and graft: Secondary | ICD-10-CM | POA: Insufficient documentation

## 2020-06-28 DIAGNOSIS — Z7902 Long term (current) use of antithrombotics/antiplatelets: Secondary | ICD-10-CM | POA: Insufficient documentation

## 2020-06-28 DIAGNOSIS — Z9861 Coronary angioplasty status: Secondary | ICD-10-CM

## 2020-06-28 DIAGNOSIS — I4510 Unspecified right bundle-branch block: Secondary | ICD-10-CM | POA: Diagnosis present

## 2020-06-28 HISTORY — PX: LEFT HEART CATH AND CORONARY ANGIOGRAPHY: CATH118249

## 2020-06-28 LAB — GLUCOSE, CAPILLARY
Glucose-Capillary: 159 mg/dL — ABNORMAL HIGH (ref 70–99)
Glucose-Capillary: 138 mg/dL — ABNORMAL HIGH (ref 70–99)

## 2020-06-28 SURGERY — LEFT HEART CATH AND CORONARY ANGIOGRAPHY
Anesthesia: LOCAL

## 2020-06-28 MED ORDER — METFORMIN HCL ER 500 MG PO TB24: 1000.0000 mg | ORAL_TABLET | Freq: Two times a day (BID) | ORAL | Status: DC

## 2020-06-28 MED ORDER — HEPARIN (PORCINE) IN NACL 1000-0.9 UT/500ML-% IV SOLN
INTRAVENOUS | Status: AC
  Filled 2020-06-28: qty 1000

## 2020-06-28 MED ORDER — SODIUM CHLORIDE 0.9% FLUSH: 3.0000 mL | Freq: Two times a day (BID) | INTRAVENOUS | Status: DC

## 2020-06-28 MED ORDER — ASPIRIN 81 MG PO CHEW: 81.0000 mg | CHEWABLE_TABLET | ORAL | Status: DC

## 2020-06-28 MED ORDER — VERAPAMIL HCL 2.5 MG/ML IV SOLN
INTRAVENOUS | Status: AC
  Filled 2020-06-28: qty 2

## 2020-06-28 MED ORDER — ONDANSETRON HCL 4 MG/2ML IJ SOLN: 4.0000 mg | Freq: Four times a day (QID) | INTRAMUSCULAR | Status: DC | PRN

## 2020-06-28 MED ORDER — VERAPAMIL HCL 2.5 MG/ML IV SOLN
INTRAVENOUS | Status: DC | PRN
  Administered 2020-06-28: 10 mL via INTRA_ARTERIAL

## 2020-06-28 MED ORDER — LIDOCAINE HCL (PF) 1 % IJ SOLN
INTRAMUSCULAR | Status: AC
  Filled 2020-06-28: qty 30

## 2020-06-28 MED ORDER — SODIUM CHLORIDE 0.9 % IV SOLN: 250.0000 mL | INTRAVENOUS | Status: DC | PRN

## 2020-06-28 MED ORDER — FENTANYL CITRATE (PF) 100 MCG/2ML IJ SOLN
INTRAMUSCULAR | Status: DC | PRN
  Administered 2020-06-28: 25 ug via INTRAVENOUS

## 2020-06-28 MED ORDER — ACETAMINOPHEN 325 MG PO TABS: 650.0000 mg | ORAL_TABLET | ORAL | Status: DC | PRN

## 2020-06-28 MED ORDER — MIDAZOLAM HCL 2 MG/2ML IJ SOLN
INTRAMUSCULAR | Status: AC
  Filled 2020-06-28: qty 2

## 2020-06-28 MED ORDER — SODIUM CHLORIDE 0.9% FLUSH: 3.0000 mL | INTRAVENOUS | Status: DC | PRN

## 2020-06-28 MED ORDER — CLOPIDOGREL BISULFATE 75 MG PO TABS
75.0000 mg | ORAL_TABLET | ORAL | Status: AC
  Administered 2020-06-28: 75 mg via ORAL
  Filled 2020-06-28: qty 1

## 2020-06-28 MED ORDER — SODIUM CHLORIDE 0.9 % IV SOLN: INTRAVENOUS | Status: DC

## 2020-06-28 MED ORDER — MIDAZOLAM HCL 2 MG/2ML IJ SOLN
INTRAMUSCULAR | Status: DC | PRN
  Administered 2020-06-28: 1 mg via INTRAVENOUS

## 2020-06-28 MED ORDER — FENTANYL CITRATE (PF) 100 MCG/2ML IJ SOLN
INTRAMUSCULAR | Status: AC
  Filled 2020-06-28: qty 2

## 2020-06-28 MED ORDER — HEPARIN SODIUM (PORCINE) 1000 UNIT/ML IJ SOLN
INTRAMUSCULAR | Status: AC
  Filled 2020-06-28: qty 1

## 2020-06-28 MED ORDER — SODIUM CHLORIDE 0.9 % IV SOLN
250.0000 mL | INTRAVENOUS | Status: DC | PRN
Start: 2020-06-28 — End: 2020-06-28

## 2020-06-28 MED ORDER — HEPARIN SODIUM (PORCINE) 1000 UNIT/ML IJ SOLN
INTRAMUSCULAR | Status: DC | PRN
  Administered 2020-06-28: 6000 [IU] via INTRAVENOUS

## 2020-06-28 MED ORDER — HEPARIN (PORCINE) IN NACL 1000-0.9 UT/500ML-% IV SOLN
INTRAVENOUS | Status: DC | PRN
  Administered 2020-06-28 (×2): 500 mL

## 2020-06-28 SURGICAL SUPPLY — 10 items
CATH 5FR JL3.5 JR4 ANG PIG MP (CATHETERS) ×2 IMPLANT
DEVICE RAD COMP TR BAND LRG (VASCULAR PRODUCTS) ×2 IMPLANT
GLIDESHEATH SLEND SS 6F .021 (SHEATH) ×2 IMPLANT
GUIDEWIRE INQWIRE 1.5J.035X260 (WIRE) ×1 IMPLANT
INQWIRE 1.5J .035X260CM (WIRE) ×2
KIT HEART LEFT (KITS) ×2 IMPLANT
PACK CARDIAC CATHETERIZATION (CUSTOM PROCEDURE TRAY) ×2 IMPLANT
SHEATH PROBE COVER 6X72 (BAG) ×2 IMPLANT
TRANSDUCER W/STOPCOCK (MISCELLANEOUS) ×2 IMPLANT
TUBING CIL FLEX 10 FLL-RA (TUBING) ×2 IMPLANT

## 2020-06-28 NOTE — Discharge Instructions (Signed)
NO METFORMIN/ GLUCOPHAGE FOR 2 DAYS   Radial Site Care  This sheet gives you information about how to care for yourself after your procedure. Your health care provider may also give you more specific instructions. If you have problems or questions, contact your health care provider. What can I expect after the procedure? After the procedure, it is common to have:  Bruising and tenderness at the catheter insertion area. Follow these instructions at home: Medicines  Take over-the-counter and prescription medicines only as told by your health care provider. Insertion site care  Follow instructions from your health care provider about how to take care of your insertion site. Make sure you: ? Wash your hands with soap and water before you change your bandage (dressing). If soap and water are not available, use hand sanitizer. ? Change your dressing as told by your health care provider. ? Leave stitches (sutures), skin glue, or adhesive strips in place. These skin closures may need to stay in place for 2 weeks or longer. If adhesive strip edges start to loosen and curl up, you may trim the loose edges. Do not remove adhesive strips completely unless your health care provider tells you to do that.  Check your insertion site every day for signs of infection. Check for: ? Redness, swelling, or pain. ? Fluid or blood. ? Pus or a bad smell. ? Warmth.  Do not take baths, swim, or use a hot tub until your health care provider approves.  You may shower 24-48 hours after the procedure, or as directed by your health care provider. ? Remove the dressing and gently wash the site with plain soap and water. ? Pat the area dry with a clean towel. ? Do not rub the site. That could cause bleeding.  Do not apply powder or lotion to the site. Activity  For 24 hours after the procedure, or as directed by your health care provider: ? Do not flex or bend the affected arm. ? Do not push or pull heavy  objects with the affected arm. ? Do not drive yourself home from the hospital or clinic. You may drive 24 hours after the procedure unless your health care provider tells you not to. ? Do not operate machinery or power tools.  Do not lift anything that is heavier than 10 lb (4.5 kg), or the limit that you are told, until your health care provider says that it is safe.  Ask your health care provider when it is okay to: ? Return to work or school. ? Resume usual physical activities or sports. ? Resume sexual activity.   General instructions  If the catheter site starts to bleed, raise your arm and put firm pressure on the site. If the bleeding does not stop, get help right away. This is a medical emergency.  If you went home on the same day as your procedure, a responsible adult should be with you for the first 24 hours after you arrive home.  Keep all follow-up visits as told by your health care provider. This is important. Contact a health care provider if:  You have a fever.  You have redness, swelling, or yellow drainage around your insertion site. Get help right away if:  You have unusual pain at the radial site.  The catheter insertion area swells very fast.  The insertion area is bleeding, and the bleeding does not stop when you hold steady pressure on the area.  Your arm or hand becomes pale, cool, tingly,   or numb. These symptoms may represent a serious problem that is an emergency. Do not wait to see if the symptoms will go away. Get medical help right away. Call your local emergency services (911 in the [place] Do not drive yourself to the hospital. Summary  After the procedure, it is common to have bruising and tenderness at the site.  Follow instructions from your health care provider about how to take care of your radial site wound. Check the wound every day for signs of infection.  Do not lift anything that is heavier than 10 lb (4.5 kg), or the limit that you are  told, until your health care provider says that it is safe. This information is not intended to replace advice given to you by your health care provider. Make sure you discuss any questions you have with your health care provider. Document Revised: 07/11/2017 Document Reviewed: 07/11/2017 Elsevier Patient Education  2021 Elsevier Inc.  

## 2020-06-28 NOTE — Interval H&P Note (Signed)
History and Physical Interval Note:  06/28/2020 10:07 AM  Joe [name]  has presented today for surgery, with the diagnosis of CAD w/ angina.  The various methods of treatment have been discussed with the patient and family. After consideration of risks, benefits and other options for treatment, the patient has consented to  Procedure(s): LEFT HEART CATH AND CORONARY ANGIOGRAPHY (N/A) as a surgical intervention.  The patient's history has been reviewed, patient examined, no change in status, stable for surgery.  I have reviewed the patient's chart and labs.  Questions were answered to the patient's satisfaction.    Cath Lab Visit (complete for each Cath Lab visit)  Clinical Evaluation Leading to the Procedure:   ACS: Yes.    Non-ACS:    Anginal Classification: CCS III  Anti-ischemic medical therapy: Minimal Therapy (1 class of medications)  Non-Invasive Test Results: No non-invasive testing performed  Prior CABG: No previous CABG        Rasheda Ledger JordanMD,FACC] 06/28/2020 10:07 AM

## 2020-06-29 ENCOUNTER — Other Ambulatory Visit: Payer: Self-pay | Admitting: Cardiology

## 2020-06-29 MED FILL — Lidocaine HCl Local Preservative Free (PF) Inj 1%: INTRAMUSCULAR | Qty: 30 | Status: AC

## 2020-07-13 ENCOUNTER — Other Ambulatory Visit: Payer: Self-pay

## 2020-07-13 ENCOUNTER — Encounter: Payer: Self-pay | Admitting: Cardiology

## 2020-07-13 ENCOUNTER — Ambulatory Visit: Payer: BC Managed Care – PPO | Admitting: Cardiology

## 2020-07-13 VITALS — BP 90/60 | HR 86 | Ht 75.0 in | Wt 361.0 lb

## 2020-07-13 DIAGNOSIS — I4510 Unspecified right bundle-branch block: Secondary | ICD-10-CM

## 2020-07-13 DIAGNOSIS — I10 Essential (primary) hypertension: Secondary | ICD-10-CM | POA: Diagnosis not present

## 2020-07-13 DIAGNOSIS — Z9861 Coronary angioplasty status: Secondary | ICD-10-CM

## 2020-07-13 DIAGNOSIS — I2510 Atherosclerotic heart disease of native coronary artery without angina pectoris: Secondary | ICD-10-CM | POA: Diagnosis not present

## 2020-07-13 DIAGNOSIS — I255 Ischemic cardiomyopathy: Secondary | ICD-10-CM

## 2020-07-13 MED ORDER — ISOSORBIDE MONONITRATE ER 60 MG PO TB24: 60.0000 mg | ORAL_TABLET | Freq: Every day | ORAL | 1 refills | Status: DC

## 2020-07-13 NOTE — Patient Instructions (Signed)
Medication Instructions:  Your physician has recommended you make the following change in your medication:   INCREASE: Imdur to 60 mg daily  *If you need a refill on your cardiac medications before your next appointment, please call your pharmacy*   Lab Work: None If you have labs (blood work) drawn today and your tests are completely normal, you will receive your results only by: Marland Kitchen MyChart Message (if you have MyChart) OR . A paper copy in the mail If you have any lab test that is abnormal or we need to change your treatment, we will call you to review the results.   Testing/Procedures: Your physician has requested that you have an echocardiogram. Echocardiography is a painless test that uses sound waves to create images of your heart. It provides your doctor with information about the size and shape of your heart and how well your heart's chambers and valves are working. This procedure takes approximately one hour. There are no restrictions for this procedure.     Follow-Up: At [facility], you and your health needs are our priority.  As part of our continuing mission to provide you with exceptional heart care, we have created designated Provider Care Teams.  These Care Teams include your primary Cardiologist (physician) and Advanced Practice Providers (APPs -  Physician Assistants and Nurse Practitioners) who all work together to provide you with the care you need, when you need it.  We recommend signing up for the patient portal called "MyChart".  Sign up information is provided on this After Visit Summary.  MyChart is used to connect with patients for Virtual Visits (Telemedicine).  Patients are able to view lab/test results, encounter notes, upcoming appointments, etc.  Non-urgent messages can be sent to your provider as well.   To learn more about what you can do with MyChart, go to [email or web address].    Your next appointment:   3 month(s)  The format for your next  appointment:   In Person  Provider:   Jenne Campus, MD   Other Instructions   Echocardiogram An echocardiogram is a test that uses sound waves (ultrasound) to produce images of the heart. Images from an echocardiogram can provide important information about:  Heart size and shape.  The size and thickness and movement of your heart's walls.  Heart muscle function and strength.  Heart valve function or if you have stenosis. Stenosis is when the heart valves are too narrow.  If blood is flowing backward through the heart valves (regurgitation).  A tumor or infectious growth around the heart valves.  Areas of heart muscle that are not working well because of poor blood flow or injury from a heart attack.  Aneurysm detection. An aneurysm is a weak or damaged part of an artery wall. The wall bulges out from the normal force of blood pumping through the body. Tell a health care provider about:  Any allergies you have.  All medicines you are taking, including vitamins, herbs, eye drops, creams, and over-the-counter medicines.  Any blood disorders you have.  Any surgeries you have had.  Any medical conditions you have.  Whether you are pregnant or may be pregnant. What are the risks? Generally, this is a safe test. However, problems may occur, including an allergic reaction to dye (contrast) that may be used during the test. What happens before the test? No specific preparation is needed. You may eat and drink normally. What happens during the test?  You will take off your clothes from the  waist up and put on a hospital gown.  Electrodes or electrocardiogram (ECG)patches may be placed on your chest. The electrodes or patches are then connected to a device that monitors your heart rate and rhythm.  You will lie down on a table for an ultrasound exam. A gel will be applied to your chest to help sound waves pass through your skin.  A handheld device, called a transducer,  will be pressed against your chest and moved over your heart. The transducer produces sound waves that travel to your heart and bounce back (or "echo" back) to the transducer. These sound waves will be captured in real-time and changed into images of your heart that can be viewed on a video monitor. The images will be recorded on a computer and reviewed by your health care provider.  You may be asked to change positions or hold your breath for a short time. This makes it easier to get different views or better views of your heart.  In some cases, you may receive contrast through an IV in one of your veins. This can improve the quality of the pictures from your heart. The procedure may vary among health care providers and hospitals.   What can I expect after the test? You may return to your normal, everyday life, including diet, activities, and medicines, unless your health care provider tells you not to do that. Follow these instructions at home:  It is up to you to get the results of your test. Ask your health care provider, or the department that is doing the test, when your results will be ready.  Keep all follow-up visits. This is important. Summary  An echocardiogram is a test that uses sound waves (ultrasound) to produce images of the heart.  Images from an echocardiogram can provide important information about the size and shape of your heart, heart muscle function, heart valve function, and other possible heart problems.  You do not need to do anything to prepare before this test. You may eat and drink normally.  After the echocardiogram is completed, you may return to your normal, everyday life, unless your health care provider tells you not to do that. This information is not intended to replace advice given to you by your health care provider. Make sure you discuss any questions you have with your health care provider. Document Revised: 01/27/2020 Document Reviewed:  01/27/2020 Elsevier Patient Education  2021 [employer].

## 2020-07-13 NOTE — Progress Notes (Signed)
Cardiology Office Note:    Date:  07/13/2020   ID:  Joe [name], DOB [birth year], MRN [id number]  PCP:  Cyndi Bender, PA-C  Cardiologist:  Jenne Campus, MD    Referring MD: Cyndi Bender, PA-C   Chief Complaint  Patient presents with  . Follow-up  I am still not doing well  History of Present Illness:    Joe [name] is a 50 y.o. male  with past medical history significant for coronary artery disease in March 2019 he required stenting to mid to distal LAD after that he was doing quite nicely but then started getting symptoms again did have a cardiac catheterization in January 2021 which showed 40% stenosis of distal LAD stents were patent, mid RCA had about 25% lesion PDA also got to 50% stenosis.  Circumflex was normal.  At the same time he was noted to have diminished left ventricle ejection fraction with echocardiogram showing ejection fraction 20 to 25%.  Eventually on March 19, 2019 he did receive CRT-D which is centered device.   Recently he ended going to [facility] for elective cardiac catheterization because of recurrence of chest pain.  Cardiac catheterization did not show any target lesion for intervention.  He is disappointed he was hoping that that be something can be fixed to make him feel better.  Still described to have some chest pain some exertional.  He is tired exhausted and fatigued.  He has difficulty moving around because of the still works though.  After work he comes back and is very tired.  Past Medical History:  Diagnosis Date  . Abnormal EKG   . Acute systolic heart failure ([place]) 09/06/2017  . Angina pectoris ([place]) 09/05/2017  . Body mass index 45.0-49.9, adult ([place])   . CAD S/P percutaneous coronary angioplasty 09/07/2017   mLAD and dLAD PCI with DES 09/06/17  . CHF (congestive heart failure) ([place])   . Chronic systolic (congestive) heart failure ([place]) 03/19/2019  . Complication of anesthesia 1995   "had hard time waking up; breathing w/vasectomy"   . Coronary artery disease   . Erectile dysfunction   . Essential hypertension, benign   . Fatigue   . GERD (gastroesophageal reflux disease)   . Gout   . High cholesterol    "just started tx yesterday" (09/06/2017)  . History of gout    "RX prn" (09/06/2017)  . Ischemic cardiomyopathy 09/07/2017   EF 25-35%  . Muscular chest pain   . 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 Obesity   . Obstructive sleep apnea   . OSA on CPAP   . Plantar fasciitis   . Pre-operative clearance 02/11/2018  . 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/02/2019   [facility]  . Right bundle branch block   . Shortness of breath 09/05/2017  . Testosterone deficiency   . Type 2 diabetes mellitus with complication, without long-term current use of insulin ([place]) 09/05/2017  . Type II diabetes mellitus ([place])    "started tx 08/28/2017"    Past Surgical History:  Procedure Laterality Date  . BACK SURGERY    . BIV ICD INSERTION CRT-D N/A 03/19/2019   Procedure: BIV ICD INSERTION CRT-D;  Surgeon: Constance Haw, MD;  Location: [place] [facility];  Service: Cardiovascular;  Laterality: N/A;  . CORONARY ANGIOPLASTY WITH STENT PLACEMENT  09/06/2017   "3 stents"  . CORONARY STENT INTERVENTION N/A 09/06/2017   Procedure: CORONARY STENT INTERVENTION;  Surgeon: Larae Grooms  S, MD;  Location: [place] [facility];  Service: Cardiovascular;  Laterality: N/A;  . CORONARY STENT INTERVENTION N/A 02/15/2018   Procedure: CORONARY STENT INTERVENTION;  Surgeon: Jettie Booze, MD;  Location: [place] [facility];  Service: Cardiovascular;  Laterality: N/A;  . KNEE ARTHROSCOPY Right   . LEFT HEART CATH AND CORONARY ANGIOGRAPHY N/A 09/06/2017   Procedure: LEFT HEART CATH AND CORONARY ANGIOGRAPHY;  Surgeon: Jettie Booze, MD;  Location: [place] [facility];  Service: Cardiovascular;  Laterality: N/A;  . LEFT HEART CATH AND CORONARY ANGIOGRAPHY N/A  02/15/2018   Procedure: LEFT HEART CATH AND CORONARY ANGIOGRAPHY;  Surgeon: Jettie Booze, MD;  Location: [place] [facility];  Service: Cardiovascular;  Laterality: N/A;  . LEFT HEART CATH AND CORONARY ANGIOGRAPHY N/A 06/27/2018   Procedure: LEFT HEART CATH AND CORONARY ANGIOGRAPHY;  Surgeon: Jettie Booze, MD;  Location: [place] [facility];  Service: Cardiovascular;  Laterality: N/A;  . LEFT HEART CATH AND CORONARY ANGIOGRAPHY N/A 06/28/2020   Procedure: LEFT HEART CATH AND CORONARY ANGIOGRAPHY;  Surgeon: Martinique, Peter M, MD;  Location: [place] [facility];  Service: Cardiovascular;  Laterality: N/A;  . LUMBAR SPINE SURGERY  ~ 2001   Intradiscal Electrothermoplasty  . VASECTOMY  1995    Current Medications: Current Meds  Medication Sig  . acetaminophen (TYLENOL) 500 MG tablet Take 1,000 mg by mouth every 6 (six) hours as needed for moderate pain or headache.  . allopurinol (ZYLOPRIM) 100 MG tablet Take 100 mg by mouth daily as needed (for gout).   Marland Kitchen aspirin EC 81 MG tablet Take 1 tablet (81 mg total) by mouth daily.  . carvedilol (COREG) 12.5 MG tablet Take 1 and 1/2 tablet twice daily. (Patient taking differently: Take 18.75 mg by mouth 2 (two) times daily with a meal.)  . clopidogrel (PLAVIX) 75 MG tablet TAKE 1 TABLET (75 MG TOTAL) BY MOUTH AT BEDTIME.  . colchicine 0.6 MG tablet Take 0.6 mg by mouth daily as needed (Gout).  . dapagliflozin propanediol (FARXIGA) 10 MG TABS tablet Take 1 tablet (10 mg total) by mouth daily. Needs appt (Patient taking differently: Take 10 mg by mouth daily.)  . digoxin (LANOXIN) 0.125 MG tablet Take 1 tablet (0.125 mg total) by mouth daily.  Marland Kitchen ENTRESTO 97-103 MG TAKE 1 TABLET BY MOUTH TWICE A DAY (Patient taking differently: Take 1 tablet by mouth 2 (two) times daily.)  . ezetimibe (ZETIA) 10 MG tablet Take 1 tablet (10 mg total) by mouth daily.  . furosemide (LASIX) 80 MG tablet Take 80 mg by mouth daily.  . halobetasol (ULTRAVATE) 0.05 % cream  APPLY TO AFFECTED AREA EVERY DAY AT BEDTIME (Patient taking differently: Apply 1 application topically daily as needed ([place]).)  . ibuprofen (ADVIL) 200 MG tablet Take 800 mg by mouth every 8 (eight) hours as needed for headache or moderate pain.  . indomethacin (INDOCIN) 50 MG capsule Take 50 mg by mouth daily as needed (Gout).  . isosorbide mononitrate (IMDUR) 30 MG 24 hr tablet TAKE 1 TABLET BY MOUTH EVERY DAY  . metFORMIN (GLUCOPHAGE-XR) 500 MG 24 hr tablet Take 2 tablets (1,000 mg total) by mouth in the morning and at bedtime.  . mexiletine (MEXITIL) 200 MG capsule TAKE 1 CAPSULE BY MOUTH TWICE A DAY (Patient taking differently: Take 200 mg by mouth 2 (two) times daily.)  . omeprazole (PRILOSEC) 40 MG capsule Take 1 capsule (40 mg total) by mouth daily.  . Polyethylene Glycol 400 (BLINK TEARS  OP) Place 2 drops into both eyes 2 (two) times daily as needed (for dry eyes).  . Potassium Chloride ER 20 MEQ TBCR TAKE 1 TABLET BY MOUTH EVERY DAY (Patient taking differently: Take 20 mEq by mouth daily.)  . ranolazine (RANEXA) 1000 MG SR tablet Take 1 tablet (1,000 mg total) by mouth 2 (two) times daily.  . rosuvastatin (CRESTOR) 40 MG tablet TAKE 1 TABLET BY MOUTH EVERY DAY IN THE EVENING (Patient taking differently: Take 40 mg by mouth at bedtime.)  . spironolactone (ALDACTONE) 25 MG tablet Take 12.5 mg by mouth daily.  . [DISCONTINUED] furosemide (LASIX) 40 MG tablet Take 1 tablet (40 mg total) by mouth daily. (Patient taking differently: Take 80 mg by mouth daily.)     Allergies:   Patient has no known allergies.   Social History   Socioeconomic History  . Marital status: Married    Spouse name: Not on file  . Number of children: Not on file  . Years of education: Not on file  . Highest education level: Not on file  Occupational History  . Not on file  Tobacco Use  . Smoking status: Never Smoker  . Smokeless tobacco: Never Used  Vaping Use  . Vaping Use: Never used  Substance and  Sexual Activity  . Alcohol use: No  . Drug use: Never  . Sexual activity: Not on file  Other Topics Concern  . Not on file  Social History Narrative  . Not on file   Social Determinants of Health   Financial Resource Strain: Not on file  Food Insecurity: Not on file  Transportation Needs: Not on file  Physical Activity: Not on file  Stress: Not on file  Social Connections: Not on file     Family History: The patient's family history includes Brain cancer in his mother; Diabetes Mellitus II in his brother and paternal grandfather; Lung cancer in his mother; Lymphoma in his father. ROS:   Please see the history of present illness.    All 14 point review of systems negative except as described per history of present illness  EKGs/Labs/Other Studies Reviewed:      Recent Labs: 06/23/2020: BUN 14; Creatinine, Ser 1.03; Hemoglobin 15.6; Platelets 253; Potassium 4.5; Sodium 140  Recent Lipid Panel    Component Value Date/Time   CHOL 124 03/27/2018 0940   TRIG 218 (H) 03/27/2018 0940   HDL 35 (L) 03/27/2018 0940   CHOLHDL 3.5 03/27/2018 0940   LDLCALC 45 03/27/2018 0940    Physical Exam:    VS:  BP 90/60 (BP Location: Left Arm, Patient Position: Sitting, Cuff Size: Large)   Pulse 86   Ht 6\' 3"  (1.905 m)   Wt (!) 361 lb (163.7 kg)   SpO2 91%   BMI 45.12 kg/m     Wt Readings from Last 3 Encounters:  07/13/20 (!) 361 lb (163.7 kg)  06/28/20 (!) 353 lb (160.1 kg)  06/23/20 (!) 357 lb (161.9 kg)     GEN:  Well nourished, well developed in no acute distress HEENT: Normal NECK: No JVD; No carotid bruits LYMPHATICS: No lymphadenopathy CARDIAC: RRR, no murmurs, no rubs, no gallops RESPIRATORY:  Clear to auscultation without rales, wheezing or rhonchi  ABDOMEN: Soft, non-tender, non-distended MUSCULOSKELETAL:  No edema; No deformity  SKIN: Warm and dry LOWER EXTREMITIES: no swelling NEUROLOGIC:  Alert and oriented x 3 PSYCHIATRIC:  Normal affect   ASSESSMENT:    1.  Right bundle branch block   2. Essential hypertension,  benign   3. CAD S/P percutaneous coronary angioplasty   4. Ischemic cardiomyopathy    PLAN:    In order of problems listed above:  1. Coronary disease.  Cardiac catheterization reviewed.  No target lesion for intervention however he does have symptomatology suggesting angina.  I will ask him to increase dose of Imdur to 60 mg daily.  Concern is his blood pressure being low.  He does have a normal left ventricle filling pressure on the cardiac catheterization, will repeat echocardiogram to recheck left ventricle ejection fraction in the future we may cut down some diuretics. 2. Essential hypertension blood pressure is on the lower side right now. 3. Dyslipidemia he is on statin with appropriately, I will schedule him later to recheck fasting ED profile. 4. We had a long discussion about his situation which is difficult in my opinion significant portion of his symptomatology is related to his severe morbid obesity.  He is asking about potentially having gastric bypass surgery of heart but he with his cardiomyopathy is not advisable.  He used to take some medications before to help him lose weight however again with his heart condition is not advisable.  We did talk in length about diet need to exercise on the regular basis which I encouraged him to do.  I see if he will succeed.  It was a long visit I did review cardiac catheterization for him for this visit   Medication Adjustments/Labs and Tests Ordered: Current medicines are reviewed at length with the patient today.  Concerns regarding medicines are outlined above.  No orders of the defined types were placed in this encounter.  Medication changes: No orders of the defined types were placed in this encounter.   Signed, Park Liter, MD, [facility] 07/13/2020 4:36 PM    [place]

## 2020-07-13 NOTE — Addendum Note (Signed)
Addended by: Senaida Ores on: 07/13/2020 04:52 PM   Modules accepted: Orders

## 2020-07-16 ENCOUNTER — Other Ambulatory Visit: Payer: Self-pay | Admitting: Cardiology

## 2020-07-19 ENCOUNTER — Ambulatory Visit (INDEPENDENT_AMBULATORY_CARE_PROVIDER_SITE_OTHER): Payer: BC Managed Care – PPO

## 2020-07-19 DIAGNOSIS — I255 Ischemic cardiomyopathy: Secondary | ICD-10-CM

## 2020-07-19 LAB — CUP PACEART REMOTE DEVICE CHECK
Battery Remaining Longevity: 67 mo
Battery Remaining Percentage: 78 %
Battery Voltage: 2.99 V
Brady Statistic AP VP Percent: 3.9 %
Brady Statistic AP VS Percent: 1 %
Brady Statistic AS VP Percent: 83 %
Brady Statistic AS VS Percent: 8.3 %
Brady Statistic RA Percent Paced: 1 %
Date Time Interrogation Session: 20220131031138
HighPow Impedance: 75 Ohm
HighPow Impedance: 75 Ohm
Implantable Lead Implant Date: 20200930
Implantable Lead Implant Date: 20200930
Implantable Lead Implant Date: 20200930
Implantable Lead Location: 753858
Implantable Lead Location: 753859
Implantable Lead Location: 753860
Implantable Pulse Generator Implant Date: 20200930
Lead Channel Impedance Value: 1050 Ohm
Lead Channel Impedance Value: 510 Ohm
Lead Channel Impedance Value: 600 Ohm
Lead Channel Pacing Threshold Amplitude: 0.75 V
Lead Channel Pacing Threshold Amplitude: 0.75 V
Lead Channel Pacing Threshold Amplitude: 1.5 V
Lead Channel Pacing Threshold Pulse Width: 0.5 ms
Lead Channel Pacing Threshold Pulse Width: 0.5 ms
Lead Channel Pacing Threshold Pulse Width: 0.5 ms
Lead Channel Sensing Intrinsic Amplitude: 12 mV
Lead Channel Sensing Intrinsic Amplitude: 3.6 mV
Lead Channel Setting Pacing Amplitude: 2 V
Lead Channel Setting Pacing Amplitude: 2.5 V
Lead Channel Setting Pacing Amplitude: 2.5 V
Lead Channel Setting Pacing Pulse Width: 0.5 ms
Lead Channel Setting Pacing Pulse Width: 0.5 ms
Lead Channel Setting Sensing Sensitivity: 0.5 mV
Pulse Gen Serial Number: 9901124

## 2020-07-28 NOTE — Progress Notes (Signed)
Remote ICD transmission.   

## 2020-07-29 ENCOUNTER — Ambulatory Visit: Payer: BC Managed Care – PPO | Admitting: Cardiology

## 2020-08-06 ENCOUNTER — Other Ambulatory Visit: Payer: Self-pay

## 2020-08-06 ENCOUNTER — Ambulatory Visit (HOSPITAL_BASED_OUTPATIENT_CLINIC_OR_DEPARTMENT_OTHER)
Admission: RE | Admit: 2020-08-06 | Discharge: 2020-08-06 | Disposition: A | Discharge status: Home / Self Care | Payer: BC Managed Care – PPO | Source: Ambulatory Visit | Attending: Cardiology | Admitting: Cardiology

## 2020-08-06 ENCOUNTER — Other Ambulatory Visit: Payer: Self-pay | Admitting: *Deleted

## 2020-08-06 DIAGNOSIS — I255 Ischemic cardiomyopathy: Secondary | ICD-10-CM

## 2020-08-06 DIAGNOSIS — Z9861 Coronary angioplasty status: Secondary | ICD-10-CM | POA: Insufficient documentation

## 2020-08-06 DIAGNOSIS — I10 Essential (primary) hypertension: Secondary | ICD-10-CM | POA: Diagnosis not present

## 2020-08-06 DIAGNOSIS — I2510 Atherosclerotic heart disease of native coronary artery without angina pectoris: Secondary | ICD-10-CM

## 2020-08-06 DIAGNOSIS — R0602 Shortness of breath: Secondary | ICD-10-CM

## 2020-08-06 DIAGNOSIS — I4510 Unspecified right bundle-branch block: Secondary | ICD-10-CM

## 2020-08-06 LAB — ECHOCARDIOGRAM COMPLETE
AR max vel: 1.74 cm2
AV Area VTI: 1.88 cm2
AV Area mean vel: 1.81 cm2
AV Mean grad: 9 mmHg
AV Peak grad: 18.8 mmHg
Ao pk vel: 2.17 m/s
Area-P 1/2: 4.06 cm2

## 2020-08-06 MED ORDER — PERFLUTREN LIPID MICROSPHERE
1.0000 mL | INTRAVENOUS | Status: AC | PRN
  Administered 2020-08-06: 3 mL via INTRAVENOUS

## 2020-08-06 NOTE — Progress Notes (Signed)
Patient was brought up from Echo, he was not feeling well. Dr. Harriet Masson gave orders to have lab work drawn.

## 2020-08-07 LAB — BASIC METABOLIC PANEL
BUN/Creatinine Ratio: 13 (ref 9–20)
BUN: 20 mg/dL (ref 6–24)
CO2: 18 mmol/L — ABNORMAL LOW (ref 20–29)
Calcium: 9.1 mg/dL (ref 8.7–10.2)
Chloride: 99 mmol/L (ref 96–106)
Creatinine, Ser: 1.52 mg/dL — ABNORMAL HIGH (ref 0.76–1.27)
GFR calc Af Amer: 61 mL/min/{1.73_m2} (ref >59)
GFR calc non Af Amer: 53 mL/min/{1.73_m2} — ABNORMAL LOW (ref >59)
Glucose: 163 mg/dL — ABNORMAL HIGH (ref 65–99)
Potassium: 4.5 mmol/L (ref 3.5–5.2)
Sodium: 139 mmol/L (ref 134–144)

## 2020-08-07 LAB — MAGNESIUM: Magnesium: 2.5 mg/dL — ABNORMAL HIGH (ref 1.6–2.3)

## 2020-08-10 ENCOUNTER — Telehealth: Payer: Self-pay | Admitting: Emergency Medicine

## 2020-08-10 DIAGNOSIS — R7989 Other specified abnormal findings of blood chemistry: Secondary | ICD-10-CM

## 2020-08-10 NOTE — Telephone Encounter (Signed)
-----   Message from Park Liter, MD sent at 08/10/2020  2:31 PM EST ----- It looks good, the only concern is kidney dysfunction,  Repeat chem 7 in 10 days   ----- Message ----- From: Senaida Ores, RN Sent: 08/09/2020   4:56 PM EST To: Park Liter, MD  Do you want to advise anything on this?? ----- Message ----- From: Berniece Salines, DO Sent: 08/07/2020   1:43 PM EST To: Park Liter, MD, Senaida Ores, RN  Here is the lab work from Friday for Joe [name] when he came for his echo.

## 2020-08-10 NOTE — Telephone Encounter (Signed)
Called patient informed him of results. He will repeat labs in 10 days.

## 2020-08-25 ENCOUNTER — Other Ambulatory Visit: Payer: Self-pay | Admitting: Cardiology

## 2020-09-07 ENCOUNTER — Ambulatory Visit: Payer: BC Managed Care – PPO | Admitting: Cardiology

## 2020-09-07 ENCOUNTER — Encounter: Payer: Self-pay | Admitting: Cardiology

## 2020-09-07 ENCOUNTER — Other Ambulatory Visit: Payer: Self-pay

## 2020-09-07 VITALS — BP 128/74 | HR 90 | Ht 72.0 in | Wt 365.0 lb

## 2020-09-07 DIAGNOSIS — Z9861 Coronary angioplasty status: Secondary | ICD-10-CM

## 2020-09-07 DIAGNOSIS — I5022 Chronic systolic (congestive) heart failure: Secondary | ICD-10-CM

## 2020-09-07 DIAGNOSIS — G4733 Obstructive sleep apnea (adult) (pediatric): Secondary | ICD-10-CM

## 2020-09-07 DIAGNOSIS — I2510 Atherosclerotic heart disease of native coronary artery without angina pectoris: Secondary | ICD-10-CM | POA: Diagnosis not present

## 2020-09-07 DIAGNOSIS — Z95810 Presence of automatic (implantable) cardiac defibrillator: Secondary | ICD-10-CM

## 2020-09-07 DIAGNOSIS — E118 Type 2 diabetes mellitus with unspecified complications: Secondary | ICD-10-CM | POA: Diagnosis not present

## 2020-09-07 DIAGNOSIS — Z6841 Body Mass Index (BMI) 40.0 and over, adult: Secondary | ICD-10-CM

## 2020-09-07 DIAGNOSIS — Z9989 Dependence on other enabling machines and devices: Secondary | ICD-10-CM

## 2020-09-07 NOTE — Progress Notes (Signed)
Cardiology Office Note:    Date:  09/07/2020   ID:  Joe [name], DOB 08-22-1970, MRN [id number]  PCP:  Cyndi Bender, PA-C  Cardiologist:  Jenne Campus, MD    Referring MD: Cyndi Bender, PA-C   Chief Complaint  Patient presents with  . Chest Pain  Still having substantial fatigue and tiredness and exhaustion  History of Present Illness:    Joe [name] is a 50 y.o. male  with past medical history significant for coronary artery disease in March 2019 he required stenting to mid to distal LAD after that he was doing quite nicely but then started getting symptoms again did have a cardiac catheterization in January 2021 which showed 40% stenosis of distal LAD stents were patent, mid RCA had about 25% lesion PDA also got to 50% stenosis. Circumflex was normal. At the same time he was noted to have diminished left ventricle ejection fraction with echocardiogram showing ejection fraction 20 to 25%. Eventually on March 19, 2019 he did receive CRT-D Recently he ended going to [facility] because of atypical chest pain, cardiac catheterization being done, no target lesion for intervention has been identified.  He has been disappointed since that time.  He complained of being weak tired exhausted having some atypical chest pain.  He gained some weight.  He did have a stress test which showed improvement left ventricle ejection fraction to about 35% now.  Past Medical History:  Diagnosis Date  . Abnormal EKG   . Acute systolic heart failure ([place]) 09/06/2017  . Angina pectoris ([place]) 09/05/2017  . Body mass index 45.0-49.9, adult ([place])   . CAD S/P percutaneous coronary angioplasty 09/07/2017   mLAD and dLAD PCI with DES 09/06/17  . CHF (congestive heart failure) ([place])   . Chronic systolic (congestive) heart failure ([place]) 03/19/2019  . Complication of anesthesia 1995   "had hard time waking up; breathing w/vasectomy"  . Coronary artery disease   . Erectile dysfunction   . Essential  hypertension, benign   . Fatigue   . GERD (gastroesophageal reflux disease)   . Gout   . High cholesterol    "just started tx yesterday" (09/06/2017)  . History of gout    "RX prn" (09/06/2017)  . Ischemic cardiomyopathy 09/07/2017   EF 25-35%  . Muscular chest pain   . 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 Obesity   . Obstructive sleep apnea   . OSA on CPAP   . Plantar fasciitis   . Pre-operative clearance 02/11/2018  . 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/02/2019   [facility]  . Right bundle branch block   . Shortness of breath 09/05/2017  . Testosterone deficiency   . Type 2 diabetes mellitus with complication, without long-term current use of insulin ([place]) 09/05/2017  . Type II diabetes mellitus ([place])    "started tx 08/28/2017"    Past Surgical History:  Procedure Laterality Date  . BACK SURGERY    . BIV ICD INSERTION CRT-D N/A 03/19/2019   Procedure: BIV ICD INSERTION CRT-D;  Surgeon: Constance Haw, MD;  Location: [facility] [facility];  Service: Cardiovascular;  Laterality: N/A;  . CORONARY ANGIOPLASTY WITH STENT PLACEMENT  09/06/2017   "3 stents"  . CORONARY STENT INTERVENTION N/A 09/06/2017   Procedure: CORONARY STENT INTERVENTION;  Surgeon: Jettie Booze, MD;  Location: [place] [facility];  Service: Cardiovascular;  Laterality: N/A;  . CORONARY STENT INTERVENTION N/A 02/15/2018   Procedure:  CORONARY STENT INTERVENTION;  Surgeon: Jettie Booze, MD;  Location: [place] [facility];  Service: Cardiovascular;  Laterality: N/A;  . KNEE ARTHROSCOPY Right   . LEFT HEART CATH AND CORONARY ANGIOGRAPHY N/A 09/06/2017   Procedure: LEFT HEART CATH AND CORONARY ANGIOGRAPHY;  Surgeon: Jettie Booze, MD;  Location: [place] [facility];  Service: Cardiovascular;  Laterality: N/A;  . LEFT HEART CATH AND CORONARY ANGIOGRAPHY N/A 02/15/2018   Procedure: LEFT HEART CATH AND CORONARY ANGIOGRAPHY;  Surgeon:  Jettie Booze, MD;  Location: [place] [facility];  Service: Cardiovascular;  Laterality: N/A;  . LEFT HEART CATH AND CORONARY ANGIOGRAPHY N/A 06/27/2018   Procedure: LEFT HEART CATH AND CORONARY ANGIOGRAPHY;  Surgeon: Jettie Booze, MD;  Location: [place] [facility];  Service: Cardiovascular;  Laterality: N/A;  . LEFT HEART CATH AND CORONARY ANGIOGRAPHY N/A 06/28/2020   Procedure: LEFT HEART CATH AND CORONARY ANGIOGRAPHY;  Surgeon: Martinique, Peter M, MD;  Location: [place] [facility];  Service: Cardiovascular;  Laterality: N/A;  . LUMBAR SPINE SURGERY  ~ 2001   Intradiscal Electrothermoplasty  . VASECTOMY  1995    Current Medications: Current Meds  Medication Sig  . acetaminophen (TYLENOL) 500 MG tablet Take 1,000 mg by mouth every 6 (six) hours as needed for moderate pain or headache.  . allopurinol (ZYLOPRIM) 100 MG tablet Take 100 mg by mouth daily as needed (for gout).   Marland Kitchen aspirin EC 81 MG tablet Take 1 tablet (81 mg total) by mouth daily.  . carvedilol (COREG) 12.5 MG tablet Take 1 and 1/2 tablet twice daily. (Patient taking differently: Take 18.75 mg by mouth 2 (two) times daily with a meal.)  . clopidogrel (PLAVIX) 75 MG tablet TAKE 1 TABLET BY MOUTH EVERY DAY AT BEDTIME  . colchicine 0.6 MG tablet Take 0.6 mg by mouth daily as needed (Gout).  . dapagliflozin propanediol (FARXIGA) 10 MG TABS tablet Take 1 tablet (10 mg total) by mouth daily. Needs appt (Patient taking differently: Take 10 mg by mouth daily.)  . digoxin (LANOXIN) 0.125 MG tablet Take 1 tablet (0.125 mg total) by mouth daily.  Marland Kitchen ENTRESTO 97-103 MG TAKE 1 TABLET BY MOUTH TWICE A DAY (Patient taking differently: Take 1 tablet by mouth 2 (two) times daily.)  . ezetimibe (ZETIA) 10 MG tablet Take 1 tablet (10 mg total) by mouth daily.  . furosemide (LASIX) 80 MG tablet Take 80 mg by mouth daily.  . halobetasol (ULTRAVATE) 0.05 % cream APPLY TO AFFECTED AREA EVERY DAY AT BEDTIME (Patient taking differently: Apply 1  application topically daily as needed ([place]).)  . ibuprofen (ADVIL) 200 MG tablet Take 800 mg by mouth every 8 (eight) hours as needed for headache or moderate pain.  . indomethacin (INDOCIN) 50 MG capsule Take 50 mg by mouth daily as needed (Gout).  . isosorbide mononitrate (IMDUR) 60 MG 24 hr tablet Take 1 tablet (60 mg total) by mouth daily. (Patient taking differently: Take 60 mg by mouth 2 (two) times daily.)  . metFORMIN (GLUCOPHAGE-XR) 500 MG 24 hr tablet Take 2 tablets (1,000 mg total) by mouth in the morning and at bedtime.  . mexiletine (MEXITIL) 200 MG capsule TAKE 1 CAPSULE BY MOUTH TWICE A DAY (Patient taking differently: Take 200 mg by mouth 2 (two) times daily.)  . nitroGLYCERIN (NITROSTAT) 0.4 MG SL tablet Place 1 tablet (0.4 mg total) under the tongue every 5 (five) minutes as needed for chest pain.  Marland Kitchen omeprazole (PRILOSEC) 40 MG capsule Take  1 capsule (40 mg total) by mouth daily.  . Polyethylene Glycol 400 (BLINK TEARS OP) Place 2 drops into both eyes 2 (two) times daily as needed (for dry eyes).  . Potassium Chloride ER 20 MEQ TBCR TAKE 1 TABLET BY MOUTH EVERY DAY (Patient taking differently: Take 20 mEq by mouth daily.)  . ranolazine (RANEXA) 1000 MG SR tablet TAKE 1 TABLET BY MOUTH TWICE A DAY  . rosuvastatin (CRESTOR) 40 MG tablet TAKE 1 TABLET BY MOUTH EVERY DAY IN THE EVENING (Patient taking differently: Take 40 mg by mouth at bedtime.)  . spironolactone (ALDACTONE) 25 MG tablet Take 12.5 mg by mouth daily.     Allergies:   Patient has no known allergies.   Social History   Socioeconomic History  . Marital status: Married    Spouse name: Not on file  . Number of children: Not on file  . Years of education: Not on file  . Highest education level: Not on file  Occupational History  . Not on file  Tobacco Use  . Smoking status: Never Smoker  . Smokeless tobacco: Never Used  Vaping Use  . Vaping Use: Never used  Substance and Sexual Activity  . Alcohol use: No   . Drug use: Never  . Sexual activity: Not on file  Other Topics Concern  . Not on file  Social History Narrative  . Not on file   Social Determinants of Health   Financial Resource Strain: Not on file  Food Insecurity: Not on file  Transportation Needs: Not on file  Physical Activity: Not on file  Stress: Not on file  Social Connections: Not on file     Family History: The patient's family history includes Brain cancer in his mother; Diabetes Mellitus II in his brother and paternal grandfather; Lung cancer in his mother; Lymphoma in his father. ROS:   Please see the history of present illness.    All 14 point review of systems negative except as described per history of present illness  EKGs/Labs/Other Studies Reviewed:      Recent Labs: 06/23/2020: Hemoglobin 15.6; Platelets 253 08/06/2020: BUN 20; Creatinine, Ser 1.52; Magnesium 2.5; Potassium 4.5; Sodium 139  Recent Lipid Panel    Component Value Date/Time   CHOL 124 03/27/2018 0940   TRIG 218 (H) 03/27/2018 0940   HDL 35 (L) 03/27/2018 0940   CHOLHDL 3.5 03/27/2018 0940   LDLCALC 45 03/27/2018 0940    Physical Exam:    VS:  BP 128/74 (BP Location: Right Arm, Patient Position: Sitting)   Pulse 90   Ht 6' (1.829 m)   Wt (!) 365 lb (165.6 kg)   SpO2 96%   BMI 49.50 kg/m     Wt Readings from Last 3 Encounters:  09/07/20 (!) 365 lb (165.6 kg)  07/13/20 (!) 361 lb (163.7 kg)  06/28/20 (!) 353 lb (160.1 kg)     GEN:  Well nourished, well developed in no acute distress HEENT: Normal NECK: No JVD; No carotid bruits LYMPHATICS: No lymphadenopathy CARDIAC: RRR, no murmurs, no rubs, no gallops RESPIRATORY:  Clear to auscultation without rales, wheezing or rhonchi  ABDOMEN: Soft, non-tender, non-distended MUSCULOSKELETAL:  No edema; No deformity  SKIN: Warm and dry LOWER EXTREMITIES: no swelling NEUROLOGIC:  Alert and oriented x 3 PSYCHIATRIC:  Normal affect   ASSESSMENT:    1. Chronic systolic  (congestive) heart failure ([place])   2. CAD S/P percutaneous coronary angioplasty   3. OSA on CPAP   4. Type 2  diabetes mellitus with complication, without long-term current use of insulin ([place])   5. Presence of cardiac resynchronization therapy defibrillator (CRT-D)   6. Body mass index 45.0-49.9, adult ([facility])    PLAN:    In order of problems listed above:  1. Coronary artery disease, stable from that point review, no target lesions for intervention medical therapy which include dual antiplatelet therapy. 2. Cardiomyopathy with significantly reduced left ventricle ejection fraction but improved overall with BiV pacing as well as appropriate guideline directed medical therapy.  His ejection fraction improved to 30 to 35%. 3. Type 2 diabetes that being followed by internal medicine team.  I have last hemoglobin A1c 6.9 this is from summer of last year. 4. Presence of cardiac resynchronization therapy.  Normal function continue present management. 5. I find Nello today in pretty good spirit.  We did talk about the fact that he need to take care of himself take some rest.  He does have occasional schedule in July.  He is looking forward to it.   Medication Adjustments/Labs and Tests Ordered: Current medicines are reviewed at length with the patient today.  Concerns regarding medicines are outlined above.  No orders of the defined types were placed in this encounter.  Medication changes: No orders of the defined types were placed in this encounter.   Signed, Park Liter, MD, [facility] 09/07/2020 3:21 PM    [place]

## 2020-09-07 NOTE — Addendum Note (Signed)
Addended by: Darrel Reach on: 09/07/2020 03:49 PM   Modules accepted: Orders

## 2020-09-07 NOTE — Patient Instructions (Signed)

## 2020-09-14 ENCOUNTER — Other Ambulatory Visit: Payer: Self-pay | Admitting: Physician Assistant

## 2020-09-14 ENCOUNTER — Other Ambulatory Visit: Payer: Self-pay | Admitting: Cardiology

## 2020-09-14 NOTE — Telephone Encounter (Signed)
This is a [place] pt °

## 2020-09-14 NOTE — Telephone Encounter (Signed)
Refill sent to pharmacy.   

## 2020-10-11 ENCOUNTER — Ambulatory Visit: Payer: BC Managed Care – PPO | Admitting: Cardiology

## 2020-10-18 ENCOUNTER — Ambulatory Visit (INDEPENDENT_AMBULATORY_CARE_PROVIDER_SITE_OTHER): Payer: BC Managed Care – PPO

## 2020-10-18 DIAGNOSIS — I255 Ischemic cardiomyopathy: Secondary | ICD-10-CM | POA: Diagnosis not present

## 2020-10-19 LAB — CUP PACEART REMOTE DEVICE CHECK
Battery Remaining Longevity: 64 mo
Battery Remaining Percentage: 75 %
Battery Voltage: 2.98 V
Brady Statistic AP VP Percent: 4.1 %
Brady Statistic AP VS Percent: 1 %
Brady Statistic AS VP Percent: 84 %
Brady Statistic AS VS Percent: 7.3 %
Brady Statistic RA Percent Paced: 1 %
Date Time Interrogation Session: 20220502020015
HighPow Impedance: 80 Ohm
HighPow Impedance: 80 Ohm
Implantable Lead Implant Date: 20200930
Implantable Lead Implant Date: 20200930
Implantable Lead Implant Date: 20200930
Implantable Lead Location: 753858
Implantable Lead Location: 753859
Implantable Lead Location: 753860
Implantable Pulse Generator Implant Date: 20200930
Lead Channel Impedance Value: 1025 Ohm
Lead Channel Impedance Value: 440 Ohm
Lead Channel Impedance Value: 530 Ohm
Lead Channel Pacing Threshold Amplitude: 0.75 V
Lead Channel Pacing Threshold Amplitude: 0.75 V
Lead Channel Pacing Threshold Amplitude: 1.5 V
Lead Channel Pacing Threshold Pulse Width: 0.5 ms
Lead Channel Pacing Threshold Pulse Width: 0.5 ms
Lead Channel Pacing Threshold Pulse Width: 0.5 ms
Lead Channel Sensing Intrinsic Amplitude: 12 mV
Lead Channel Sensing Intrinsic Amplitude: 3.3 mV
Lead Channel Setting Pacing Amplitude: 2 V
Lead Channel Setting Pacing Amplitude: 2.5 V
Lead Channel Setting Pacing Amplitude: 2.5 V
Lead Channel Setting Pacing Pulse Width: 0.5 ms
Lead Channel Setting Pacing Pulse Width: 0.5 ms
Lead Channel Setting Sensing Sensitivity: 0.5 mV
Pulse Gen Serial Number: 9901124

## 2020-10-26 ENCOUNTER — Other Ambulatory Visit: Payer: Self-pay | Admitting: Cardiology

## 2020-10-26 ENCOUNTER — Telehealth: Payer: Self-pay | Admitting: Cardiology

## 2020-10-26 DIAGNOSIS — I493 Ventricular premature depolarization: Secondary | ICD-10-CM

## 2020-10-26 NOTE — Telephone Encounter (Signed)
Informed patient of results and verbal understanding expressed. He is aware monitor will be mailed.

## 2020-10-26 NOTE — Telephone Encounter (Signed)
Joe Haw, MD  [name], RN Normal remote reviewed. Battery and lead parameters stable. CRT pacing reduced. Needs 7 day monitor for PVC burden.

## 2020-10-26 NOTE — Telephone Encounter (Signed)
Patient returning call to [facility] for pacemaker transmission.

## 2020-10-28 NOTE — Telephone Encounter (Signed)
Ordered placed. Instructions sent via [employer]

## 2020-10-29 ENCOUNTER — Encounter: Payer: Self-pay | Admitting: Radiology

## 2020-10-29 ENCOUNTER — Ambulatory Visit (INDEPENDENT_AMBULATORY_CARE_PROVIDER_SITE_OTHER): Payer: BC Managed Care – PPO

## 2020-10-29 ENCOUNTER — Telehealth: Payer: Self-pay | Admitting: Student

## 2020-10-29 DIAGNOSIS — I493 Ventricular premature depolarization: Secondary | ICD-10-CM

## 2020-10-29 NOTE — Telephone Encounter (Addendum)
   Wife called answering service with concerns around 7:30am with concerns of hypotension and lightheadedness/dizzinse. Called and spoke with wife. She reports BP 80/50 this morning and patient having lightheadedness/dizziness sitting down. No chest pain, shortness of breath, or syncope. He is on multiple antihypertensives due to cardiomyopathy. He has not taken any of his mediations today. Advised patient to hold all BP medications today (Entresto, Coreg, Lasix, Spironolactone, Imdur). Advised patient to increase fluid intake and sodium intake for today. Advised patient/wife check BP multiple times today. If BP does not improve or symptoms continue, he should go to the ED. Also recommended going to the ED if he develops any other symptoms. Advised patient/wife to keep BP log for the next several days and then send this to Dr. Agustin Cree so that he can make appropriate medication changes.  Will route note to Dr. Drue Dun so he can give input on what medications changes he would recommend going into the weekend.   Triage, can you also call back later today to check on patient and make sure BP has improved? Thank you!  Darreld Mclean, PA-C 10/29/2020 8:36 AM

## 2020-10-29 NOTE — Telephone Encounter (Signed)
Called patient wife informed her per dpr to hold all bp meds this week and keep log of bp and hr this weekend. Advised her to call if patient bp or hr get too high or low this weekend. She understood. She will call Monday with readings regardless. No further questions.

## 2020-10-29 NOTE — Progress Notes (Signed)
Enrolled patient for a 7 day Zio XT monitor to be mailed to patients home.  

## 2020-10-29 NOTE — Telephone Encounter (Signed)
Triage, please see Dr. Flo Shanks note below. Can you please call to check on patient this afternoon? Let's just hold BP medications throughout the weekend and have patient keep BP/HR log throughout the weekend and then notify us of these readings on Monday that way Dr. Drue Dun can give medication recommendations at that time. If they have any concerns about BP over the weekend (BP continues to remain low or all of a sudden spikes), they can call on-call provider.  Thank you!

## 2020-11-01 DIAGNOSIS — I493 Ventricular premature depolarization: Secondary | ICD-10-CM

## 2020-11-03 NOTE — Telephone Encounter (Signed)
Left message for patient to return call.

## 2020-11-04 ENCOUNTER — Other Ambulatory Visit: Payer: Self-pay

## 2020-11-04 ENCOUNTER — Encounter: Payer: Self-pay | Admitting: Cardiology

## 2020-11-04 ENCOUNTER — Ambulatory Visit: Payer: BC Managed Care – PPO | Admitting: Cardiology

## 2020-11-04 VITALS — BP 118/78 | HR 70 | Ht 72.0 in | Wt 374.0 lb

## 2020-11-04 DIAGNOSIS — I255 Ischemic cardiomyopathy: Secondary | ICD-10-CM

## 2020-11-04 DIAGNOSIS — G4733 Obstructive sleep apnea (adult) (pediatric): Secondary | ICD-10-CM | POA: Diagnosis not present

## 2020-11-04 DIAGNOSIS — I10 Essential (primary) hypertension: Secondary | ICD-10-CM | POA: Diagnosis not present

## 2020-11-04 MED ORDER — ENTRESTO 24-26 MG PO TABS: 1.0000 | ORAL_TABLET | Freq: Two times a day (BID) | ORAL | 2 refills | Status: DC

## 2020-11-04 NOTE — Patient Instructions (Signed)
Medication Instructions:  Your physician has recommended you make the following change in your medication:   DECREASE: Entresto 24/26 mg twice daily   After a few days if you are doing fine you can go back on coreg.   *If you need a refill on your cardiac medications before your next appointment, please call your pharmacy*   Lab Work: Your physician recommends that you return for lab work today: bmp, Pro bnp   If you have labs (blood work) drawn today and your tests are completely normal, you will receive your results only by: Marland Kitchen MyChart Message (if you have MyChart) OR . A paper copy in the mail If you have any lab test that is abnormal or we need to change your treatment, we will call you to review the results.   Testing/Procedures: none   Follow-Up: At [facility], you and your health needs are our priority.  As part of our continuing mission to provide you with exceptional heart care, we have created designated Provider Care Teams.  These Care Teams include your primary Cardiologist (physician) and Advanced Practice Providers (APPs -  Physician Assistants and Nurse Practitioners) who all work together to provide you with the care you need, when you need it.  We recommend signing up for the patient portal called "MyChart".  Sign up information is provided on this After Visit Summary.  MyChart is used to connect with patients for Virtual Visits (Telemedicine).  Patients are able to view lab/test results, encounter notes, upcoming appointments, etc.  Non-urgent messages can be sent to your provider as well.   To learn more about what you can do with MyChart, go to [email or web address].    Your next appointment:   1 week(s)  The format for your next appointment:   In Person  Provider:   Jenne Campus, MD   Other Instructions

## 2020-11-04 NOTE — Progress Notes (Signed)
Cardiology Office Note:    Date:  11/04/2020   ID:  [name], DOB Oct 28, 1970, MRN [id number]  PCP:  Cyndi Bender, PA-C  Cardiologist:  Jenne Campus, MD    Referring MD: Cyndi Bender, PA-C   Chief Complaint  Patient presents with  . Hypotension    History of Present Illness:    Joe [name] is a 50 y.o. male   with past medical history significant for coronary artery disease in March 2019 he required stenting to mid to distal LAD after that he was doing quite nicely but then started getting symptoms again did have a cardiac catheterization in January 2021 which showed 40% stenosis of distal LAD stents were patent, mid RCA had about 25% lesion PDA also got to 50% stenosis. Circumflex was normal. At the same time he was noted to have diminished left ventricle ejection fraction with echocardiogram showing ejection fraction 20 to 25%. Eventually on March 19, 2019 he did receive CRT-D Recently he ended going to [facility] because of atypical chest pain, cardiac catheterization being done, no target lesion for intervention has been identified.  He requested to be seen because at the end of last week he started feeling very poorly.  He checked his blood pressure repeatedly his blood pressure was very low he call our on-call team and they advised him to stop many medication that reduces blood pressure.  Since that time he has been doing slightly better but still complaining of being weak tired and exhausted his blood pressure is good today.  Also recently had interrogation of his device showed frequent ventricular ectopy.  He is wearing Zio patch right now try to estimate the burden of PVCs.  Past Medical History:  Diagnosis Date  . Abnormal EKG   . Acute systolic heart failure ([place]) 09/06/2017  . Angina pectoris ([place]) 09/05/2017  . Body mass index 45.0-49.9, adult ([place])   . CAD S/P percutaneous coronary angioplasty 09/07/2017   mLAD and dLAD PCI with DES 09/06/17  . CHF  (congestive heart failure) ([place])   . Chronic systolic (congestive) heart failure ([place]) 03/19/2019  . Complication of anesthesia 1995   "had hard time waking up; breathing w/vasectomy"  . Coronary artery disease   . Erectile dysfunction   . Essential hypertension, benign   . Fatigue   . GERD (gastroesophageal reflux disease)   . Gout   . High cholesterol    "just started tx yesterday" (09/06/2017)  . History of gout    "RX prn" (09/06/2017)  . Ischemic cardiomyopathy 09/07/2017   EF 25-35%  . Muscular chest pain   . 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 Obesity   . Obstructive sleep apnea   . OSA on CPAP   . Plantar fasciitis   . Pre-operative clearance 02/11/2018  . 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/02/2019   [facility]  . Right bundle branch block   . Shortness of breath 09/05/2017  . Testosterone deficiency   . Type 2 diabetes mellitus with complication, without long-term current use of insulin ([place]) 09/05/2017  . Type II diabetes mellitus ([place])    "started tx 08/28/2017"    Past Surgical History:  Procedure Laterality Date  . BACK SURGERY    . BIV ICD INSERTION CRT-D N/A 03/19/2019   Procedure: BIV ICD INSERTION CRT-D;  Surgeon: Constance Haw, MD;  Location: [place] [facility];  Service: Cardiovascular;  Laterality: N/A;  . CORONARY ANGIOPLASTY WITH  STENT PLACEMENT  09/06/2017   "3 stents"  . CORONARY STENT INTERVENTION N/A 09/06/2017   Procedure: CORONARY STENT INTERVENTION;  Surgeon: Jettie Booze, MD;  Location: [place] [facility];  Service: Cardiovascular;  Laterality: N/A;  . CORONARY STENT INTERVENTION N/A 02/15/2018   Procedure: CORONARY STENT INTERVENTION;  Surgeon: Jettie Booze, MD;  Location: [place] [facility];  Service: Cardiovascular;  Laterality: N/A;  . KNEE ARTHROSCOPY Right   . LEFT HEART CATH AND CORONARY ANGIOGRAPHY N/A 09/06/2017   Procedure: LEFT HEART CATH AND  CORONARY ANGIOGRAPHY;  Surgeon: Jettie Booze, MD;  Location: [place] [facility];  Service: Cardiovascular;  Laterality: N/A;  . LEFT HEART CATH AND CORONARY ANGIOGRAPHY N/A 02/15/2018   Procedure: LEFT HEART CATH AND CORONARY ANGIOGRAPHY;  Surgeon: Jettie Booze, MD;  Location: [place] [facility];  Service: Cardiovascular;  Laterality: N/A;  . LEFT HEART CATH AND CORONARY ANGIOGRAPHY N/A 06/27/2018   Procedure: LEFT HEART CATH AND CORONARY ANGIOGRAPHY;  Surgeon: Jettie Booze, MD;  Location: [place] [facility];  Service: Cardiovascular;  Laterality: N/A;  . LEFT HEART CATH AND CORONARY ANGIOGRAPHY N/A 06/28/2020   Procedure: LEFT HEART CATH AND CORONARY ANGIOGRAPHY;  Surgeon: Martinique, Peter M, MD;  Location: [place] [facility];  Service: Cardiovascular;  Laterality: N/A;  . LUMBAR SPINE SURGERY  ~ 2001   Intradiscal Electrothermoplasty  . VASECTOMY  1995    Current Medications: Current Meds  Medication Sig  . acetaminophen (TYLENOL) 500 MG tablet Take 1,000 mg by mouth every 6 (six) hours as needed for moderate pain or headache.  . allopurinol (ZYLOPRIM) 100 MG tablet Take 100 mg by mouth daily as needed (for gout).   Marland Kitchen aspirin EC 81 MG tablet Take 1 tablet (81 mg total) by mouth daily.  . carvedilol (COREG) 12.5 MG tablet Take 1 and 1/2 tablet twice daily.  . carvedilol (COREG) 12.5 MG tablet Take 18.75 mg by mouth 2 (two) times daily with a meal. Take 1 1/2 daily  . colchicine 0.6 MG tablet Take 0.6 mg by mouth daily as needed (Gout).  . dapagliflozin propanediol (FARXIGA) 10 MG TABS tablet Take 1 tablet (10 mg total) by mouth daily. Needs appt  . digoxin (LANOXIN) 0.125 MG tablet Take 1 tablet (0.125 mg total) by mouth daily.  Marland Kitchen ezetimibe (ZETIA) 10 MG tablet Take 1 tablet (10 mg total) by mouth daily.  . furosemide (LASIX) 80 MG tablet Take 80 mg by mouth daily.  . halobetasol (ULTRAVATE) 0.05 % cream Apply 1 application topically at bedtime as needed (Ecz[place]).  Marland Kitchen  ibuprofen (ADVIL) 200 MG tablet Take 800 mg by mouth every 8 (eight) hours as needed for headache or moderate pain.  . indomethacin (INDOCIN) 50 MG capsule Take 50 mg by mouth daily as needed (Gout).  . isosorbide mononitrate (IMDUR) 60 MG 24 hr tablet Take 60 mg by mouth daily.  . metFORMIN (GLUCOPHAGE-XR) 500 MG 24 hr tablet Take 2 tablets (1,000 mg total) by mouth in the morning and at bedtime.  . mexiletine (MEXITIL) 200 MG capsule Take 1 capsule (200 mg total) by mouth 2 (two) times daily. Please make yearly appt with Dr. Curt Bears for April 2022 for future refills. Thank you 1st attempt  . nitroGLYCERIN (NITROSTAT) 0.4 MG SL tablet Place 1 tablet (0.4 mg total) under the tongue every 5 (five) minutes as needed for chest pain.  Marland Kitchen omeprazole (PRILOSEC) 40 MG capsule Take 1 capsule (40 mg total) by mouth daily.  Marland Kitchen  Polyethylene Glycol 400 (BLINK TEARS OP) Place 2 drops into both eyes 2 (two) times daily as needed (for dry eyes).  . potassium chloride SA (KLOR-CON) 20 MEQ tablet Take 20 mEq by mouth daily.  . ranolazine (RANEXA) 1000 MG SR tablet Take 1,000 mg by mouth 2 (two) times daily.  . rosuvastatin (CRESTOR) 40 MG tablet Take 40 mg by mouth every evening.  . sacubitril-valsartan (ENTRESTO) 97-103 MG Take 1 tablet by mouth 2 (two) times daily.  Marland Kitchen spironolactone (ALDACTONE) 25 MG tablet Take 12.5 mg by mouth daily. Take 1/2 daily     Allergies:   Patient has no known allergies.   Social History   Socioeconomic History  . Marital status: Married    Spouse name: Not on file  . Number of children: Not on file  . Years of education: Not on file  . Highest education level: Not on file  Occupational History  . Not on file  Tobacco Use  . Smoking status: Never Smoker  . Smokeless tobacco: Never Used  Vaping Use  . Vaping Use: Never used  Substance and Sexual Activity  . Alcohol use: No  . Drug use: Never  . Sexual activity: Not on file  Other Topics Concern  . Not on file  Social  History Narrative  . Not on file   Social Determinants of Health   Financial Resource Strain: Not on file  Food Insecurity: Not on file  Transportation Needs: Not on file  Physical Activity: Not on file  Stress: Not on file  Social Connections: Not on file     Family History: The patient's family history includes Brain cancer in his mother; Diabetes Mellitus II in his brother and paternal grandfather; Lung cancer in his mother; Lymphoma in his father. ROS:   Please see the history of present illness.    All 14 point review of systems negative except as described per history of present illness  EKGs/Labs/Other Studies Reviewed:      Recent Labs: 06/23/2020: Hemoglobin 15.6; Platelets 253 08/06/2020: BUN 20; Creatinine, Ser 1.52; Magnesium 2.5; Potassium 4.5; Sodium 139  Recent Lipid Panel    Component Value Date/Time   CHOL 124 03/27/2018 0940   TRIG 218 (H) 03/27/2018 0940   HDL 35 (L) 03/27/2018 0940   CHOLHDL 3.5 03/27/2018 0940   LDLCALC 45 03/27/2018 0940    Physical Exam:    VS:  BP 118/78 (BP Location: Right Arm, Patient Position: Sitting, Cuff Size: Normal)   Pulse 70   Ht 6' (1.829 m)   Wt (!) 374 lb (169.6 kg)   SpO2 94%   BMI 50.72 kg/m     Wt Readings from Last 3 Encounters:  11/04/20 (!) 374 lb (169.6 kg)  09/07/20 (!) 365 lb (165.6 kg)  07/13/20 (!) 361 lb (163.7 kg)     GEN:  Well nourished, well developed in no acute distress HEENT: Normal NECK: No JVD; No carotid bruits LYMPHATICS: No lymphadenopathy CARDIAC: RRR, no murmurs, no rubs, no gallops RESPIRATORY:  Clear to auscultation without rales, wheezing or rhonchi  ABDOMEN: Soft, non-tender, non-distended MUSCULOSKELETAL:  No edema; No deformity  SKIN: Warm and dry LOWER EXTREMITIES: no swelling NEUROLOGIC:  Alert and oriented x 3 PSYCHIATRIC:  Normal affect   ASSESSMENT:    1. Ischemic cardiomyopathy   2. Essential hypertension, benign   3. Obstructive sleep apnea    PLAN:    In  order of problems listed above:  1. Ischemic cardiomyopathy last estimation showed ejection  fraction about 35%.  We had to withdrawal many medication because of low blood pressure.  Today I started gradually putting him back on medications.  I gave him samples of Entresto 2426 twice daily I also asked him to start carvedilol 12.5 twice daily.  Ask him to check his weight on the regular basis.  I check Chem-7 and proBNP today. 2. Essential hypertension we are facing opposite problem blood pressure being low. 3. Obstructive sleep apnea that managed by antimedicine team.  4. Frequent ventricular ectopy.  He is wearing Zio patch to assess the burden of PVCs.  I am concerned about Daishon, trying to find out why he became hypotensive.  Recent echocardiogram showed actually mild improvement left ventricle ejection fraction but still significantly diminished.  I suspect may be a part of the reason for his low blood pressure is simply the fact that weather became very hot and he may be somewhat dehydrated.  We still holding on diuretic but I told him to check his weight if weight goes up more than 5 pounds then he need to go back on diuretic.  I see him very quickly at the beginning of next week because I am considering the situation very fragile.   Medication Adjustments/Labs and Tests Ordered: Current medicines are reviewed at length with the patient today.  Concerns regarding medicines are outlined above.  No orders of the defined types were placed in this encounter.  Medication changes: No orders of the defined types were placed in this encounter.   Signed, Park Liter, MD, [facility] 11/04/2020 11:06 AM    [place]

## 2020-11-05 LAB — BASIC METABOLIC PANEL
BUN/Creatinine Ratio: 9 (ref 9–20)
BUN: 8 mg/dL (ref 6–24)
CO2: 21 mmol/L (ref 20–29)
Calcium: 8.3 mg/dL — ABNORMAL LOW (ref 8.7–10.2)
Chloride: 101 mmol/L (ref 96–106)
Creatinine, Ser: 0.9 mg/dL (ref 0.76–1.27)
Glucose: 268 mg/dL — ABNORMAL HIGH (ref 65–99)
Potassium: 4 mmol/L (ref 3.5–5.2)
Sodium: 138 mmol/L (ref 134–144)
eGFR: 104 mL/min/{1.73_m2} (ref >59)

## 2020-11-05 LAB — PRO B NATRIURETIC PEPTIDE: NT-Pro BNP: 76 pg/mL (ref 0–121)

## 2020-11-09 NOTE — Progress Notes (Signed)
Remote ICD transmission.   

## 2020-11-11 ENCOUNTER — Other Ambulatory Visit: Payer: Self-pay

## 2020-11-11 ENCOUNTER — Encounter: Payer: Self-pay | Admitting: Cardiology

## 2020-11-11 ENCOUNTER — Ambulatory Visit: Payer: BC Managed Care – PPO | Admitting: Cardiology

## 2020-11-11 VITALS — BP 124/70 | HR 74 | Ht 75.0 in | Wt 367.0 lb

## 2020-11-11 DIAGNOSIS — I10 Essential (primary) hypertension: Secondary | ICD-10-CM | POA: Diagnosis not present

## 2020-11-11 DIAGNOSIS — I2510 Atherosclerotic heart disease of native coronary artery without angina pectoris: Secondary | ICD-10-CM

## 2020-11-11 DIAGNOSIS — R7989 Other specified abnormal findings of blood chemistry: Secondary | ICD-10-CM

## 2020-11-11 DIAGNOSIS — I255 Ischemic cardiomyopathy: Secondary | ICD-10-CM | POA: Diagnosis not present

## 2020-11-11 DIAGNOSIS — E118 Type 2 diabetes mellitus with unspecified complications: Secondary | ICD-10-CM

## 2020-11-11 DIAGNOSIS — E66813 Obesity, class 3: Secondary | ICD-10-CM

## 2020-11-11 DIAGNOSIS — I5022 Chronic systolic (congestive) heart failure: Secondary | ICD-10-CM

## 2020-11-11 DIAGNOSIS — Z9861 Coronary angioplasty status: Secondary | ICD-10-CM

## 2020-11-11 NOTE — Patient Instructions (Signed)
Medication Instructions:  Your physician recommends that you continue on your current medications as directed. Please refer to the Current Medication list given to you today.  *If you need a refill on your cardiac medications before your next appointment, please call your pharmacy*   Lab Work: Your physician recommends that you return for lab work in: CMET, Testosterone, TSH  If you have labs (blood work) drawn today and your tests are completely normal, you will receive your results only by: Marland Kitchen MyChart Message (if you have MyChart) OR . A paper copy in the mail If you have any lab test that is abnormal or we need to change your treatment, we will call you to review the results.   Testing/Procedures: None   Follow-Up: At [facility], you and your health needs are our priority.  As part of our continuing mission to provide you with exceptional heart care, we have created designated Provider Care Teams.  These Care Teams include your primary Cardiologist (physician) and Advanced Practice Providers (APPs -  Physician Assistants and Nurse Practitioners) who all work together to provide you with the care you need, when you need it.  We recommend signing up for the patient portal called "MyChart".  Sign up information is provided on this After Visit Summary.  MyChart is used to connect with patients for Virtual Visits (Telemedicine).  Patients are able to view lab/test results, encounter notes, upcoming appointments, etc.  Non-urgent messages can be sent to your provider as well.   To learn more about what you can do with MyChart, go to [email or web address].    Your next appointment:   2 week(s)  The format for your next appointment:   In Person  Provider:   Jenne Campus, MD    Other Instructions

## 2020-11-11 NOTE — Progress Notes (Signed)
Cardiology Office Note:    Date:  11/11/2020   ID:  Joe [name], DOB 1971/02/25, MRN [id number]  PCP:  Cyndi Bender, PA-C  Cardiologist:  Jenne Campus, MD    Referring MD: Cyndi Bender, PA-C   Chief Complaint  Patient presents with  . Dizziness    History of Present Illness:    Joe [name] is a 50 y.o. male  with past medical history significant for coronary artery disease in March 2019 he required stenting to mid to distal LAD after that he was doing quite nicely but then started getting symptoms again did have a cardiac catheterization in January 2021 which showed 40% stenosis of distal LAD stents were patent, mid RCA had about 25% lesion PDA also got to 50% stenosis. Circumflex was normal. At the same time he was noted to have diminished left ventricle ejection fraction with echocardiogram showing ejection fraction 20 to 25%. Eventually on March 19, 2019 he did receive CRT-D Recently he ended going to [facility] because of atypical chest pain, cardiac catheterization being done, no target lesion for intervention has been identified. I did see him a week ago because of low blood pressure.  Apparently a lot of medication has been withdrawn he started feeling better we still have no good explanation for his low blood pressure.  I put him on small dosages of medication and he comes today to my office for follow-up and interestingly he went back on the full dosages of medication including high dose of Entresto high dose of carvedilol.  In spite of that he did not report an episode of significant low blood pressure he complained of being weak tired and exhausted however.  No chest pain tightness squeezing pressure burning chest.  No swelling of lower extremities, his weight is stable  Past Medical History:  Diagnosis Date  . Abnormal EKG   . Acute systolic heart failure ([place]) 09/06/2017  . Angina pectoris ([place]) 09/05/2017  . Body mass index 45.0-49.9, adult ([place])   . CAD  S/P percutaneous coronary angioplasty 09/07/2017   mLAD and dLAD PCI with DES 09/06/17  . CHF (congestive heart failure) ([place])   . Chronic systolic (congestive) heart failure ([place]) 03/19/2019  . Complication of anesthesia 1995   "had hard time waking up; breathing w/vasectomy"  . Coronary artery disease   . Erectile dysfunction   . Essential hypertension, benign   . Fatigue   . GERD (gastroesophageal reflux disease)   . Gout   . High cholesterol    "just started tx yesterday" (09/06/2017)  . History of gout    "RX prn" (09/06/2017)  . Ischemic cardiomyopathy 09/07/2017   EF 25-35%  . Muscular chest pain   . 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 Obesity   . Obstructive sleep apnea   . OSA on CPAP   . Plantar fasciitis   . Pre-operative clearance 02/11/2018  . 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/02/2019   [facility] Jude  . Right bundle branch block   . Shortness of breath 09/05/2017  . Testosterone deficiency   . Type 2 diabetes mellitus with complication, without long-term current use of insulin ([place]) 09/05/2017  . Type II diabetes mellitus ([place])    "started tx 08/28/2017"    Past Surgical History:  Procedure Laterality Date  . BACK SURGERY    . BIV ICD INSERTION CRT-D N/A 03/19/2019   Procedure: BIV ICD INSERTION CRT-D;  Surgeon: Constance Haw, MD;  Location: [place] [facility];  Service: Cardiovascular;  Laterality: N/A;  . CORONARY ANGIOPLASTY WITH STENT PLACEMENT  09/06/2017   "3 stents"  . CORONARY STENT INTERVENTION N/A 09/06/2017   Procedure: CORONARY STENT INTERVENTION;  Surgeon: Jettie Booze, MD;  Location: [place] [facility];  Service: Cardiovascular;  Laterality: N/A;  . CORONARY STENT INTERVENTION N/A 02/15/2018   Procedure: CORONARY STENT INTERVENTION;  Surgeon: Jettie Booze, MD;  Location: [place] [facility];  Service: Cardiovascular;  Laterality: N/A;  . KNEE ARTHROSCOPY Right    . LEFT HEART CATH AND CORONARY ANGIOGRAPHY N/A 09/06/2017   Procedure: LEFT HEART CATH AND CORONARY ANGIOGRAPHY;  Surgeon: Jettie Booze, MD;  Location: [place] [facility];  Service: Cardiovascular;  Laterality: N/A;  . LEFT HEART CATH AND CORONARY ANGIOGRAPHY N/A 02/15/2018   Procedure: LEFT HEART CATH AND CORONARY ANGIOGRAPHY;  Surgeon: Jettie Booze, MD;  Location: [place] [facility];  Service: Cardiovascular;  Laterality: N/A;  . LEFT HEART CATH AND CORONARY ANGIOGRAPHY N/A 06/27/2018   Procedure: LEFT HEART CATH AND CORONARY ANGIOGRAPHY;  Surgeon: Jettie Booze, MD;  Location: [place] [facility];  Service: Cardiovascular;  Laterality: N/A;  . LEFT HEART CATH AND CORONARY ANGIOGRAPHY N/A 06/28/2020   Procedure: LEFT HEART CATH AND CORONARY ANGIOGRAPHY;  Surgeon: Martinique, Peter M, MD;  Location: [place] [facility];  Service: Cardiovascular;  Laterality: N/A;  . LUMBAR SPINE SURGERY  ~ 2001   Intradiscal Electrothermoplasty  . VASECTOMY  1995    Current Medications: Current Meds  Medication Sig  . acetaminophen (TYLENOL) 500 MG tablet Take 1,000 mg by mouth every 6 (six) hours as needed for moderate pain or headache.  . allopurinol (ZYLOPRIM) 100 MG tablet Take 100 mg by mouth daily as needed (for gout).   Marland Kitchen aspirin EC 81 MG tablet Take 1 tablet (81 mg total) by mouth daily.  . carvedilol (COREG) 12.5 MG tablet Take 1 and 1/2 tablet twice daily. (Patient taking differently: Take 37.5 mg by mouth 2 (two) times daily with a meal. Take 1 and 1/2 tablet twice daily.)  . dapagliflozin propanediol (FARXIGA) 10 MG TABS tablet Take 1 tablet (10 mg total) by mouth daily. Needs appt  . digoxin (LANOXIN) 0.125 MG tablet Take 1 tablet (0.125 mg total) by mouth daily.  Marland Kitchen ezetimibe (ZETIA) 10 MG tablet Take 1 tablet (10 mg total) by mouth daily.  . furosemide (LASIX) 80 MG tablet Take 80 mg by mouth daily.  . halobetasol (ULTRAVATE) 0.05 % cream Apply 1 application topically at bedtime  as needed (Eczema).  Marland Kitchen ibuprofen (ADVIL) 200 MG tablet Take 800 mg by mouth every 8 (eight) hours as needed for headache or moderate pain.  . indomethacin (INDOCIN) 50 MG capsule Take 50 mg by mouth daily as needed (Gout).  . isosorbide mononitrate (IMDUR) 60 MG 24 hr tablet Take 60 mg by mouth daily.  . metFORMIN (GLUCOPHAGE-XR) 500 MG 24 hr tablet Take 2 tablets (1,000 mg total) by mouth in the morning and at bedtime.  . mexiletine (MEXITIL) 200 MG capsule Take 1 capsule (200 mg total) by mouth 2 (two) times daily. Please make yearly appt with Dr. Curt Bears for April 2022 for future refills. Thank you 1st attempt  . nitroGLYCERIN (NITROSTAT) 0.4 MG SL tablet Place 1 tablet (0.4 mg total) under the tongue every 5 (five) minutes as needed for chest pain.  Marland Kitchen omeprazole (PRILOSEC) 40 MG capsule Take 1 capsule (40 mg total) by mouth daily.  Marland Kitchen  Polyethylene Glycol 400 (BLINK TEARS OP) Place 2 drops into both eyes 2 (two) times daily as needed (for dry eyes).  . potassium chloride SA (KLOR-CON) 20 MEQ tablet Take 20 mEq by mouth daily.  . ranolazine (RANEXA) 1000 MG SR tablet Take 1,000 mg by mouth 2 (two) times daily.  . rosuvastatin (CRESTOR) 40 MG tablet Take 40 mg by mouth every evening.  . sacubitril-valsartan (ENTRESTO) 24-26 MG Take 1 tablet by mouth 2 (two) times daily.  Marland Kitchen spironolactone (ALDACTONE) 25 MG tablet Take 12.5 mg by mouth daily. Take 1/2 daily     Allergies:   Patient has no known allergies.   Social History   Socioeconomic History  . Marital status: Married    Spouse name: Not on file  . Number of children: Not on file  . Years of education: Not on file  . Highest education level: Not on file  Occupational History  . Not on file  Tobacco Use  . Smoking status: Never Smoker  . Smokeless tobacco: Never Used  Vaping Use  . Vaping Use: Never used  Substance and Sexual Activity  . Alcohol use: No  . Drug use: Never  . Sexual activity: Not on file  Other Topics Concern  .  Not on file  Social History Narrative  . Not on file   Social Determinants of Health   Financial Resource Strain: Not on file  Food Insecurity: Not on file  Transportation Needs: Not on file  Physical Activity: Not on file  Stress: Not on file  Social Connections: Not on file     Family History: The patient's family history includes Brain cancer in his mother; Diabetes Mellitus II in his brother and paternal grandfather; Lung cancer in his mother; Lymphoma in his father. ROS:   Please see the history of present illness.    All 14 point review of systems negative except as described per history of present illness  EKGs/Labs/Other Studies Reviewed:      Recent Labs: 06/23/2020: Hemoglobin 15.6; Platelets 253 08/06/2020: Magnesium 2.5 11/04/2020: BUN 8; Creatinine, Ser 0.90; NT-Pro BNP 76; Potassium 4.0; Sodium 138  Recent Lipid Panel    Component Value Date/Time   CHOL 124 03/27/2018 0940   TRIG 218 (H) 03/27/2018 0940   HDL 35 (L) 03/27/2018 0940   CHOLHDL 3.5 03/27/2018 0940   LDLCALC 45 03/27/2018 0940    Physical Exam:    VS:  BP 124/70 (BP Location: Right Arm, Patient Position: Sitting)   Pulse 74   Ht 6\' 3"  (1.905 m)   Wt (!) 367 lb (166.5 kg)   SpO2 93%   BMI 45.87 kg/m     Wt Readings from Last 3 Encounters:  11/11/20 (!) 367 lb (166.5 kg)  11/04/20 (!) 374 lb (169.6 kg)  09/07/20 (!) 365 lb (165.6 kg)     GEN:  Well nourished, well developed in no acute distress HEENT: Normal NECK: No JVD; No carotid bruits LYMPHATICS: No lymphadenopathy CARDIAC: RRR, no murmurs, no rubs, no gallops RESPIRATORY:  Clear to auscultation without rales, wheezing or rhonchi  ABDOMEN: Soft, non-tender, non-distended MUSCULOSKELETAL:  No edema; No deformity  SKIN: Warm and dry LOWER EXTREMITIES: no swelling NEUROLOGIC:  Alert and oriented x 3 PSYCHIATRIC:  Normal affect   ASSESSMENT:    1. Chronic systolic (congestive) heart failure ([place])   2. CAD S/P percutaneous  coronary angioplasty   3. Essential hypertension, benign   4. Ischemic cardiomyopathy    PLAN:    In  order of problems listed above:  1. Chronic systolic congestive heart failure seems to be compensated, proBNP done last time was normal.  He is continuing his maintenance medication seems to be tolerating this quite well.  He does have a trip to [place] to help his sister who got ALS and he is worried about his condition before he goes therefore I will follow-up with him in 2 weeks which will be just before he goes to [place].  However today on the physical exam he looks compensated.  We still trying to answer the question why he became hypotensive.  I will check his thyroid testosterone as well as calcium that was low on last time when we check it. 2. Coronary disease stable denies have any chest pain, tightness, pressure, burning in the chest last cardiac catheterization did not show any target lesion for intervention. 3. Essential hypertension blood pressure well controlled continue present management. 4. Ischemic cardiomyopathy with latest estimation of all 30 to 35%.  He did wear monitor Zio patch to try to see the burden of PVCs which can contribute to his feelings.  We waiting for results of it.  Medication Adjustments/Labs and Tests Ordered: Current medicines are reviewed at length with the patient today.  Concerns regarding medicines are outlined above.  No orders of the defined types were placed in this encounter.  Medication changes: No orders of the defined types were placed in this encounter.   Signed, Park Liter, MD, [facility] 11/11/2020 8:26 AM    [place]

## 2020-11-12 LAB — COMPREHENSIVE METABOLIC PANEL
ALT: 16 IU/L (ref 0–44)
AST: 20 IU/L (ref 0–40)
Albumin/Globulin Ratio: 1.8 (ref 1.2–2.2)
Albumin: 4.4 g/dL (ref 4.0–5.0)
Alkaline Phosphatase: 92 IU/L (ref 44–121)
BUN/Creatinine Ratio: 11 (ref 9–20)
BUN: 12 mg/dL (ref 6–24)
Bilirubin Total: 0.4 mg/dL (ref 0.0–1.2)
CO2: 19 mmol/L — ABNORMAL LOW (ref 20–29)
Calcium: 9.2 mg/dL (ref 8.7–10.2)
Chloride: 98 mmol/L (ref 96–106)
Creatinine, Ser: 1.13 mg/dL (ref 0.76–1.27)
Globulin, Total: 2.4 g/dL (ref 1.5–4.5)
Glucose: 239 mg/dL — ABNORMAL HIGH (ref 65–99)
Potassium: 4.3 mmol/L (ref 3.5–5.2)
Sodium: 137 mmol/L (ref 134–144)
Total Protein: 6.8 g/dL (ref 6.0–8.5)
eGFR: 79 mL/min/{1.73_m2} (ref >59)

## 2020-11-12 LAB — TSH: TSH: 2.98 u[IU]/mL (ref 0.450–4.500)

## 2020-11-12 LAB — TESTOSTERONE: Testosterone: 173 ng/dL — ABNORMAL LOW (ref 264–916)

## 2020-11-16 ENCOUNTER — Telehealth: Payer: Self-pay

## 2020-11-16 NOTE — Telephone Encounter (Signed)
Patient notified of test results 

## 2020-11-16 NOTE — Telephone Encounter (Signed)
-----   Message from Park Liter, MD sent at 11/16/2020  3:08 PM EDT ----- Labs are looking good, testosterone is low

## 2020-11-24 ENCOUNTER — Ambulatory Visit: Payer: BC Managed Care – PPO | Admitting: Cardiology

## 2020-11-24 ENCOUNTER — Encounter: Payer: Self-pay | Admitting: Cardiology

## 2020-11-24 ENCOUNTER — Other Ambulatory Visit: Payer: Self-pay

## 2020-11-24 VITALS — BP 90/62 | HR 68 | Ht 75.0 in | Wt 368.0 lb

## 2020-11-24 DIAGNOSIS — Z95810 Presence of automatic (implantable) cardiac defibrillator: Secondary | ICD-10-CM

## 2020-11-24 DIAGNOSIS — Z6841 Body Mass Index (BMI) 40.0 and over, adult: Secondary | ICD-10-CM

## 2020-11-24 DIAGNOSIS — E118 Type 2 diabetes mellitus with unspecified complications: Secondary | ICD-10-CM

## 2020-11-24 DIAGNOSIS — I2510 Atherosclerotic heart disease of native coronary artery without angina pectoris: Secondary | ICD-10-CM | POA: Diagnosis not present

## 2020-11-24 DIAGNOSIS — I10 Essential (primary) hypertension: Secondary | ICD-10-CM | POA: Diagnosis not present

## 2020-11-24 DIAGNOSIS — I255 Ischemic cardiomyopathy: Secondary | ICD-10-CM | POA: Diagnosis not present

## 2020-11-24 NOTE — Patient Instructions (Signed)

## 2020-11-24 NOTE — Progress Notes (Signed)
Cardiology Office Note:    Date:  11/24/2020   ID:  Joe [name], DOB [birth year], MRN [id number]  PCP:  Cyndi Bender, PA-C  Cardiologist:  Jenne Campus, MD    Referring MD: Cyndi Bender, PA-C   Chief Complaint  Patient presents with  . Follow-up    History of Present Illness:    Joe [name] is a 50 y.o. male  with past medical history significant for coronary artery disease in March 2019 he required stenting to mid to distal LAD after that he was doing quite nicely but then started getting symptoms again did have a cardiac catheterization in January 2021 which showed 40% stenosis of distal LAD stents were patent, mid RCA had about 25% lesion PDA also got to 50% stenosis. Circumflex was normal. At the same time he was noted to have diminished left ventricle ejection fraction with echocardiogram showing ejection fraction 20 to 25%. Eventually on March 19, 2019 he did receive CRT-D Recently he ended going to [facility] because of atypical chest pain, cardiac catheterization being done, no target lesion for intervention has been identified. He comes today to my office for follow-up.  Overall he seems to be doing well but described the fact that yesterday he felt weak tired and exhausted.  Then today we have again similar scenario his blood pressure being low only 90 systolic.  I suspect his weakness and fatigue is related to low blood pressure and dehydration.  Denies have any chest pain tightness squeezing pressure burning chest no passing out.  Past Medical History:  Diagnosis Date  . Abnormal EKG   . Acute systolic heart failure ([place]) 09/06/2017  . Angina pectoris ([place]) 09/05/2017  . Body mass index 45.0-49.9, adult ([place])   . CAD S/P percutaneous coronary angioplasty 09/07/2017   mLAD and dLAD PCI with DES 09/06/17  . CHF (congestive heart failure) ([place])   . Chronic systolic (congestive) heart failure ([place]) 03/19/2019  . Complication of anesthesia 1995   "had hard time  waking up; breathing w/vasectomy"  . Coronary artery disease   . Erectile dysfunction   . Essential hypertension, benign   . Fatigue   . GERD (gastroesophageal reflux disease)   . Gout   . High cholesterol    "just started tx yesterday" (09/06/2017)  . History of gout    "RX prn" (09/06/2017)  . Ischemic cardiomyopathy 09/07/2017   EF 25-35%  . Muscular chest pain   . 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 Obesity   . Obstructive sleep apnea   . OSA on CPAP   . Plantar fasciitis   . Pre-operative clearance 02/11/2018  . 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/02/2019   [facility] Jude  . Right bundle branch block   . Shortness of breath 09/05/2017  . Testosterone deficiency   . Type 2 diabetes mellitus with complication, without long-term current use of insulin ([place]) 09/05/2017  . Type II diabetes mellitus ([place])    "started tx 08/28/2017"    Past Surgical History:  Procedure Laterality Date  . BACK SURGERY    . BIV ICD INSERTION CRT-D N/A 03/19/2019   Procedure: BIV ICD INSERTION CRT-D;  Surgeon: Constance Haw, MD;  Location: [place] [facility];  Service: Cardiovascular;  Laterality: N/A;  . CORONARY ANGIOPLASTY WITH STENT PLACEMENT  09/06/2017   "3 stents"  . CORONARY STENT INTERVENTION N/A 09/06/2017   Procedure: CORONARY STENT INTERVENTION;  Surgeon: Jettie Booze, MD;  Location: [place] [facility];  Service: Cardiovascular;  Laterality: N/A;  . CORONARY STENT INTERVENTION N/A 02/15/2018   Procedure: CORONARY STENT INTERVENTION;  Surgeon: Jettie Booze, MD;  Location: [place] [facility];  Service: Cardiovascular;  Laterality: N/A;  . KNEE ARTHROSCOPY Right   . LEFT HEART CATH AND CORONARY ANGIOGRAPHY N/A 09/06/2017   Procedure: LEFT HEART CATH AND CORONARY ANGIOGRAPHY;  Surgeon: Jettie Booze, MD;  Location: [place] [facility];  Service: Cardiovascular;  Laterality: N/A;  . LEFT HEART CATH  AND CORONARY ANGIOGRAPHY N/A 02/15/2018   Procedure: LEFT HEART CATH AND CORONARY ANGIOGRAPHY;  Surgeon: Jettie Booze, MD;  Location: [place] [facility];  Service: Cardiovascular;  Laterality: N/A;  . LEFT HEART CATH AND CORONARY ANGIOGRAPHY N/A 06/27/2018   Procedure: LEFT HEART CATH AND CORONARY ANGIOGRAPHY;  Surgeon: Jettie Booze, MD;  Location: [place] [facility];  Service: Cardiovascular;  Laterality: N/A;  . LEFT HEART CATH AND CORONARY ANGIOGRAPHY N/A 06/28/2020   Procedure: LEFT HEART CATH AND CORONARY ANGIOGRAPHY;  Surgeon: Martinique, Peter M, MD;  Location: [place] [facility];  Service: Cardiovascular;  Laterality: N/A;  . LUMBAR SPINE SURGERY  ~ 2001   Intradiscal Electrothermoplasty  . VASECTOMY  1995    Current Medications: Current Meds  Medication Sig  . acetaminophen (TYLENOL) 500 MG tablet Take 1,000 mg by mouth every 6 (six) hours as needed for moderate pain or headache.  . allopurinol (ZYLOPRIM) 100 MG tablet Take 100 mg by mouth daily as needed (for gout).   Marland Kitchen aspirin EC 81 MG tablet Take 1 tablet (81 mg total) by mouth daily.  . carvedilol (COREG) 12.5 MG tablet Take 1 and 1/2 tablet twice daily. (Patient taking differently: Take 37.5 mg by mouth 2 (two) times daily with a meal. Take 1 and 1/2 tablet twice daily.)  . colchicine 0.6 MG tablet Take 0.6 mg by mouth daily as needed (Gout).  . dapagliflozin propanediol (FARXIGA) 10 MG TABS tablet Take 1 tablet (10 mg total) by mouth daily. Needs appt  . digoxin (LANOXIN) 0.125 MG tablet Take 1 tablet (0.125 mg total) by mouth daily.  Marland Kitchen ezetimibe (ZETIA) 10 MG tablet Take 1 tablet (10 mg total) by mouth daily.  . furosemide (LASIX) 80 MG tablet Take 80 mg by mouth daily.  . halobetasol (ULTRAVATE) 0.05 % cream Apply 1 application topically at bedtime as needed (Eczema).  Marland Kitchen ibuprofen (ADVIL) 200 MG tablet Take 800 mg by mouth every 8 (eight) hours as needed for headache or moderate pain.  . indomethacin (INDOCIN) 50 MG  capsule Take 50 mg by mouth daily as needed for mild pain or moderate pain (Gout).  . isosorbide mononitrate (IMDUR) 60 MG 24 hr tablet Take 60 mg by mouth daily.  . metFORMIN (GLUCOPHAGE-XR) 500 MG 24 hr tablet Take 2 tablets (1,000 mg total) by mouth in the morning and at bedtime.  . mexiletine (MEXITIL) 200 MG capsule Take 1 capsule (200 mg total) by mouth 2 (two) times daily. Please make yearly appt with Dr. Curt Bears for April 2022 for future refills. Thank you 1st attempt  . nitroGLYCERIN (NITROSTAT) 0.4 MG SL tablet Place 1 tablet (0.4 mg total) under the tongue every 5 (five) minutes as needed for chest pain.  Marland Kitchen omeprazole (PRILOSEC) 40 MG capsule Take 1 capsule (40 mg total) by mouth daily.  . Polyethylene Glycol 400 (BLINK TEARS OP) Place 2 drops into both eyes 2 (two) times daily as needed (for dry eyes).  Marland Kitchen  potassium chloride SA (KLOR-CON) 20 MEQ tablet Take 20 mEq by mouth daily.  . ranolazine (RANEXA) 1000 MG SR tablet Take 1,000 mg by mouth 2 (two) times daily.  . rosuvastatin (CRESTOR) 40 MG tablet Take 40 mg by mouth every evening.  . sacubitril-valsartan (ENTRESTO) 97-103 MG Take 1 tablet by mouth 2 (two) times daily.  Marland Kitchen spironolactone (ALDACTONE) 25 MG tablet Take 12.5 mg by mouth daily. Take 1/2 daily     Allergies:   Patient has no known allergies.   Social History   Socioeconomic History  . Marital status: Married    Spouse name: Not on file  . Number of children: Not on file  . Years of education: Not on file  . Highest education level: Not on file  Occupational History  . Not on file  Tobacco Use  . Smoking status: Never Smoker  . Smokeless tobacco: Never Used  Vaping Use  . Vaping Use: Never used  Substance and Sexual Activity  . Alcohol use: No  . Drug use: Never  . Sexual activity: Not on file  Other Topics Concern  . Not on file  Social History Narrative  . Not on file   Social Determinants of Health   Financial Resource Strain: Not on file  Food  Insecurity: Not on file  Transportation Needs: Not on file  Physical Activity: Not on file  Stress: Not on file  Social Connections: Not on file     Family History: The patient's family history includes Brain cancer in his mother; Diabetes Mellitus II in his brother and paternal grandfather; Lung cancer in his mother; Lymphoma in his father. ROS:   Please see the history of present illness.    All 14 point review of systems negative except as described per history of present illness  EKGs/Labs/Other Studies Reviewed:      Recent Labs: 06/23/2020: Hemoglobin 15.6; Platelets 253 08/06/2020: Magnesium 2.5 11/04/2020: NT-Pro BNP 76 11/11/2020: ALT 16; BUN 12; Creatinine, Ser 1.13; Potassium 4.3; Sodium 137; TSH 2.980  Recent Lipid Panel    Component Value Date/Time   CHOL 124 03/27/2018 0940   TRIG 218 (H) 03/27/2018 0940   HDL 35 (L) 03/27/2018 0940   CHOLHDL 3.5 03/27/2018 0940   LDLCALC 45 03/27/2018 0940    Physical Exam:    VS:  BP 90/62 (BP Location: Right Arm, Patient Position: Sitting)   Pulse 68   Ht 6\' 3"  (1.905 m)   Wt (!) 368 lb (166.9 kg)   SpO2 91%   BMI 46.00 kg/m     Wt Readings from Last 3 Encounters:  11/24/20 (!) 368 lb (166.9 kg)  11/11/20 (!) 367 lb (166.5 kg)  11/04/20 (!) 374 lb (169.6 kg)     GEN:  Well nourished, well developed in no acute distress HEENT: Normal NECK: No JVD; No carotid bruits LYMPHATICS: No lymphadenopathy CARDIAC: RRR, no murmurs, no rubs, no gallops RESPIRATORY:  Clear to auscultation without rales, wheezing or rhonchi  ABDOMEN: Soft, non-tender, non-distended MUSCULOSKELETAL:  No edema; No deformity  SKIN: Warm and dry LOWER EXTREMITIES: no swelling NEUROLOGIC:  Alert and oriented x 3 PSYCHIATRIC:  Normal affect   ASSESSMENT:    1. Ischemic cardiomyopathy   2. Coronary artery disease involving native coronary artery of native heart without angina pectoris   3. Essential hypertension, benign   4. Presence of cardiac  resynchronization therapy defibrillator (CRT-D)   5. Body mass index 45.0-49.9, adult ([place])   6. Type 2 diabetes mellitus with  complication, without long-term current use of insulin ([facility])    PLAN:    In order of problems listed above:  1. Ischemic cardiomyopathy on appropriate medications ask him actually to cut down his diuretic Lasix from 80 mg to 40 mg daily I gave him instruction to check his weight every single day after he wakes up in the morning and to urinate check his weight if he notices that his weight going up but at least 2 pounds in 2 consecutive days he needs to take 80 mg of furosemide.  He is planning to go to [place] to help his sister who got ALS and I warned him about be careful with a hot environment of [place].  I want him to check his weight every single day and he said he will be able to do it and I will ask him to reduce dose of furosemide to 40 mg daily and if his weight keeps going down may be been continuously discontinuing temporarily that medication.  I also told him if he have any questions about that to send [place] a message. 2. Essential hypertension we do have low blood pressure right now.  Plan as outlined above. 3. Cardiac resynchronization desires present.  I did review interrogation normal function, recently had monitor which showed 5.4% burden of PVCs.  He is already on beta-blocker which I will continue.  We will continue present management. 4. Morbid obesity obviously a problem he can clearly benefit from weight loss he is trying to work on that. 5. Type 2 diabetes.  Last hemoglobin A1c I did review from [facility] show 6.9 which is quite decently controlled.  We will continue present management.   Medication Adjustments/Labs and Tests Ordered: Current medicines are reviewed at length with the patient today.  Concerns regarding medicines are outlined above.  No orders of the defined types were placed in this encounter.  Medication changes: No orders of the defined  types were placed in this encounter.   Signed, Park Liter, MD, [facility] 11/24/2020 8:39 AM    [place]

## 2020-12-09 ENCOUNTER — Ambulatory Visit: Payer: BC Managed Care – PPO | Admitting: Cardiology

## 2021-01-08 ENCOUNTER — Other Ambulatory Visit: Payer: Self-pay | Admitting: Cardiology

## 2021-01-10 ENCOUNTER — Other Ambulatory Visit: Payer: Self-pay | Admitting: Cardiology

## 2021-01-10 ENCOUNTER — Other Ambulatory Visit (HOSPITAL_COMMUNITY): Payer: Self-pay | Admitting: Internal Medicine

## 2021-01-10 NOTE — Telephone Encounter (Signed)
Per Dr.Krasowski to continue with '40mg'$  qd and to continue monitoring his weight as instructed on last OV.

## 2021-01-10 NOTE — Telephone Encounter (Signed)
Message sent to Dr. Agustin Cree to clarify instructions.

## 2021-01-17 ENCOUNTER — Ambulatory Visit (INDEPENDENT_AMBULATORY_CARE_PROVIDER_SITE_OTHER): Payer: BC Managed Care – PPO

## 2021-01-17 DIAGNOSIS — I255 Ischemic cardiomyopathy: Secondary | ICD-10-CM

## 2021-01-18 LAB — CUP PACEART REMOTE DEVICE CHECK
Battery Remaining Longevity: 60 mo
Battery Remaining Percentage: 72 %
Battery Voltage: 2.98 V
Brady Statistic AP VP Percent: 4.4 %
Brady Statistic AP VS Percent: 1 %
Brady Statistic AS VP Percent: 85 %
Brady Statistic AS VS Percent: 6.6 %
Brady Statistic RA Percent Paced: 1.5 %
Date Time Interrogation Session: 20220801032112
HighPow Impedance: 73 Ohm
HighPow Impedance: 73 Ohm
Implantable Lead Implant Date: 20200930
Implantable Lead Implant Date: 20200930
Implantable Lead Implant Date: 20200930
Implantable Lead Location: 753858
Implantable Lead Location: 753859
Implantable Lead Location: 753860
Implantable Pulse Generator Implant Date: 20200930
Lead Channel Impedance Value: 1025 Ohm
Lead Channel Impedance Value: 430 Ohm
Lead Channel Impedance Value: 530 Ohm
Lead Channel Pacing Threshold Amplitude: 0.75 V
Lead Channel Pacing Threshold Amplitude: 0.75 V
Lead Channel Pacing Threshold Amplitude: 1.5 V
Lead Channel Pacing Threshold Pulse Width: 0.5 ms
Lead Channel Pacing Threshold Pulse Width: 0.5 ms
Lead Channel Pacing Threshold Pulse Width: 0.5 ms
Lead Channel Sensing Intrinsic Amplitude: 12 mV
Lead Channel Sensing Intrinsic Amplitude: 3.2 mV
Lead Channel Setting Pacing Amplitude: 2 V
Lead Channel Setting Pacing Amplitude: 2.5 V
Lead Channel Setting Pacing Amplitude: 2.5 V
Lead Channel Setting Pacing Pulse Width: 0.5 ms
Lead Channel Setting Pacing Pulse Width: 0.5 ms
Lead Channel Setting Sensing Sensitivity: 0.5 mV
Pulse Gen Serial Number: 9901124

## 2021-01-21 IMAGING — CT CT ANGIO CHEST-ABD-PELV FOR DISSECTION W/ AND WO/W CM
2 of 9 series · 14 of 46 positions shown, 16 images · IV contrast (APPLIED)
Comparison: Chest x-ray December 20, 2018

CLINICAL DATA: Mid chest and upper abdominal pain for 1 day.
Nausea.

EXAM:
CT ANGIOGRAPHY CHEST, ABDOMEN AND PELVIS
TECHNIQUE: Multidetector CT imaging through the chest, abdomen and pelvis was
performed using the standard protocol during bolus administration of
intravenous contrast. Multiplanar reconstructed images and MIPs were
obtained and reviewed to evaluate the vascular anatomy.
CONTRAST:  100mL OMNIPAQUE IOHEXOL 350 MG/ML SOLN

[Series 5: axial arterial · axial · arterial · 0.98mm/px · z∈[-718,-70]mm · 11 of 242 slices shown, 13 images]
[im 13/242  soft-tissue]
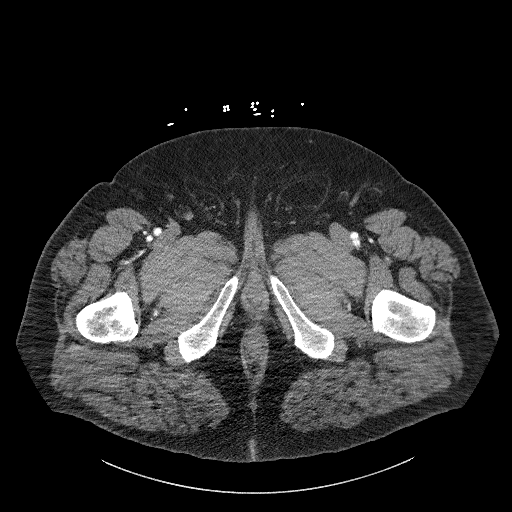
[im 13/242  bone]
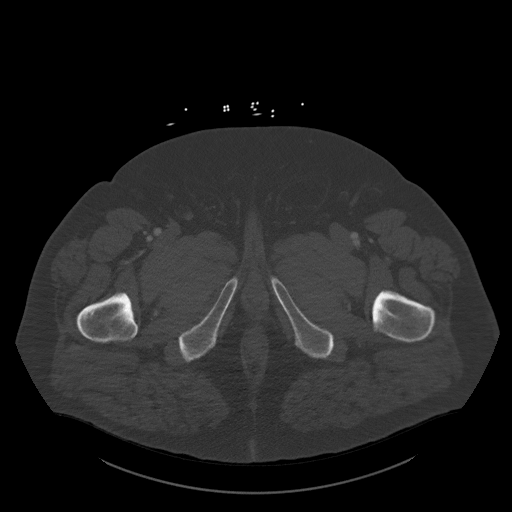
[im 37/242  soft-tissue]
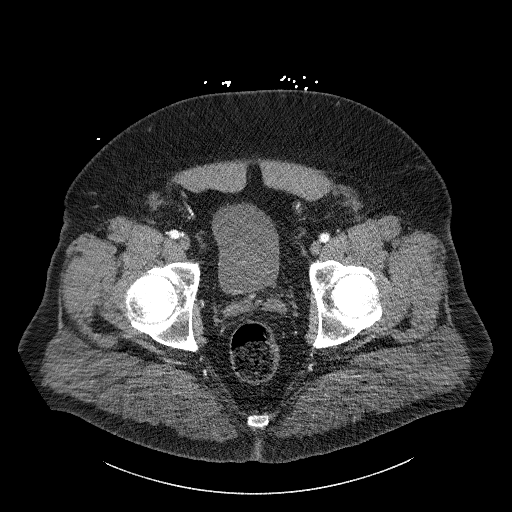
[im 61/242  soft-tissue]
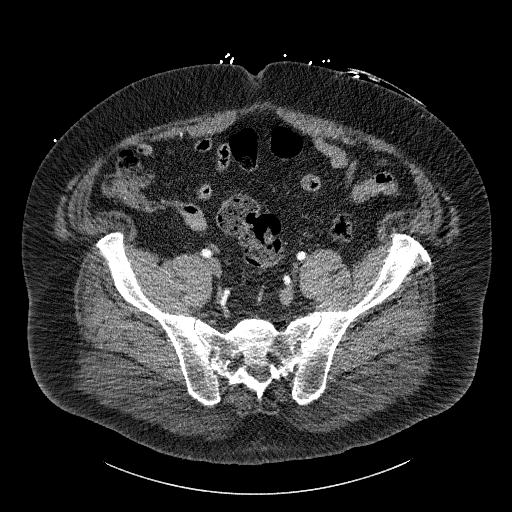
[im 85/242  soft-tissue]
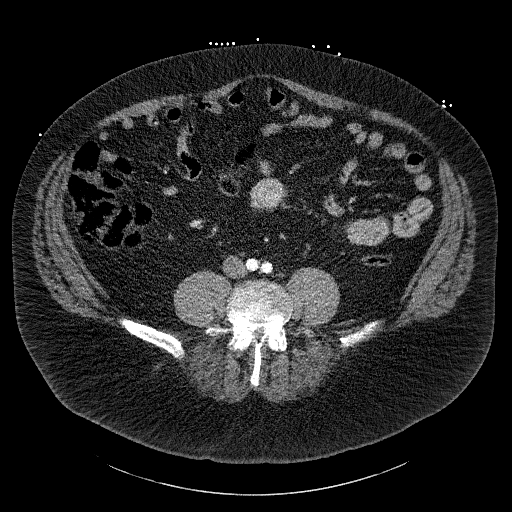
[im 97/242  soft-tissue]
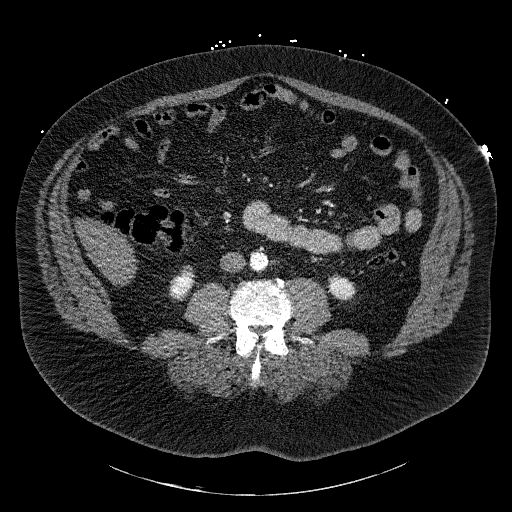
[im 121/242  soft-tissue]
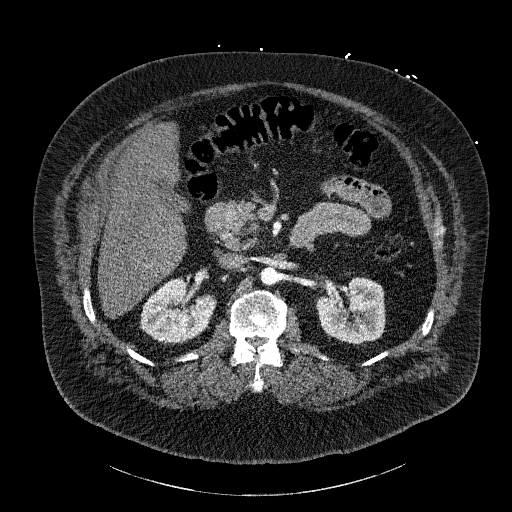
[im 145/242  soft-tissue]
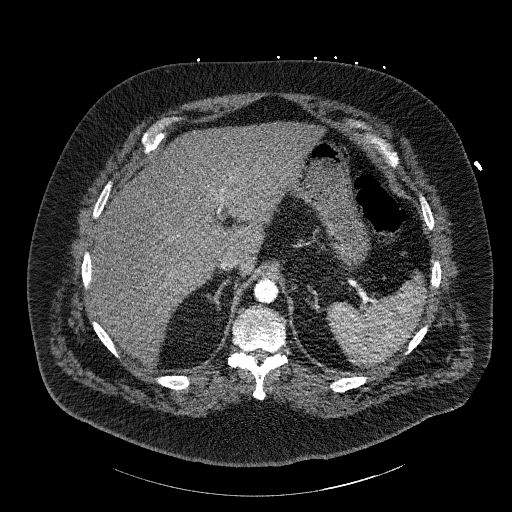
[im 157/242  soft-tissue]
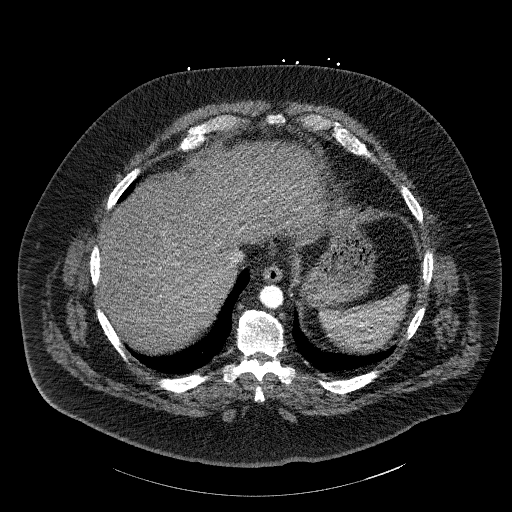
[im 181/242  soft-tissue]
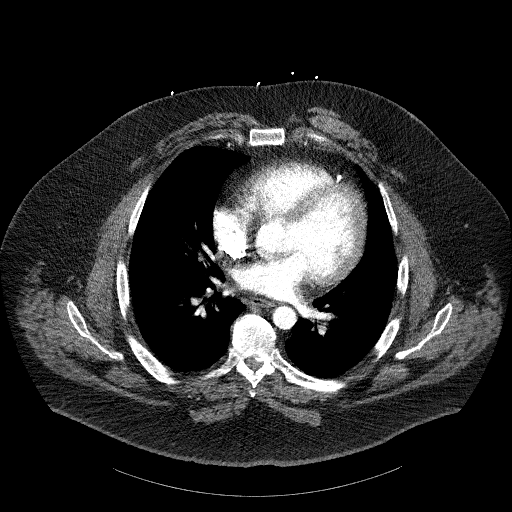
[im 181/242  bone]
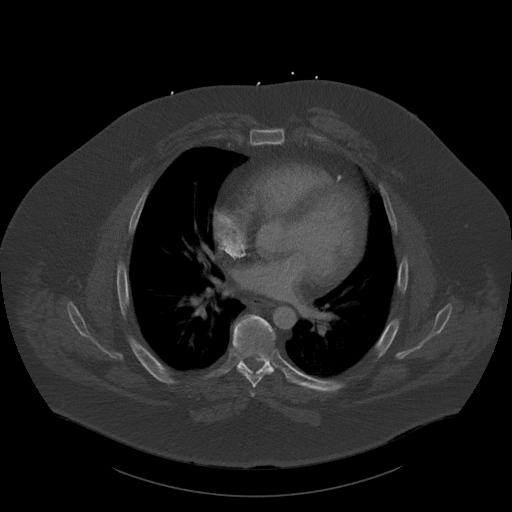
[im 205/242  soft-tissue]
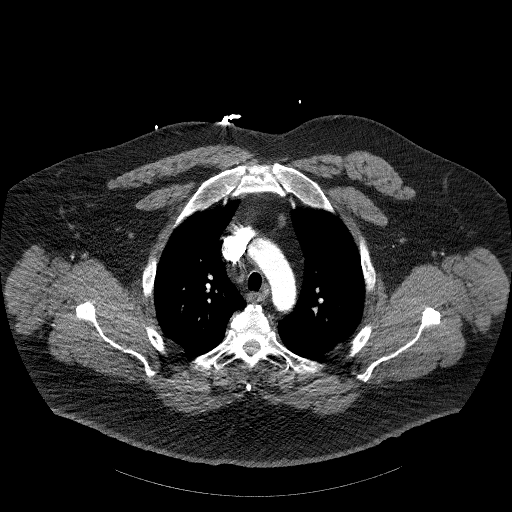
[im 229/242  soft-tissue]
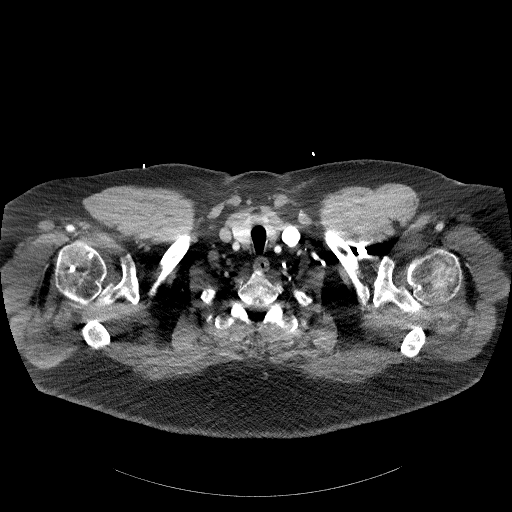

[Series 8: coronals · coronal · 1.08mm/px · 3 of 220 slices shown]
[im 55/220  soft-tissue]
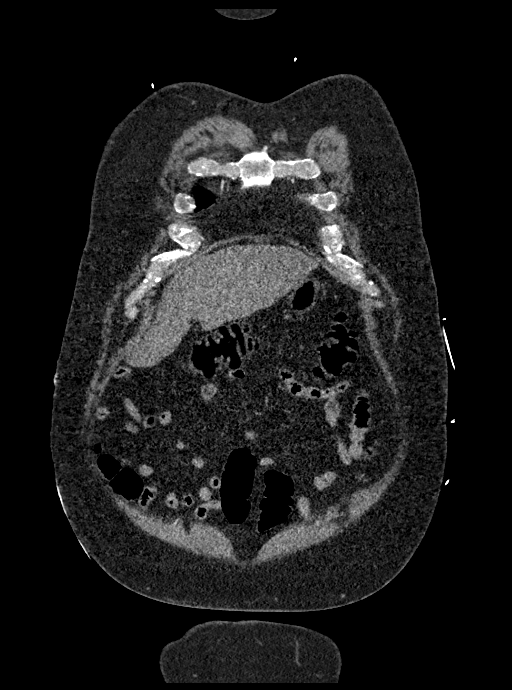
[im 110/220  soft-tissue]
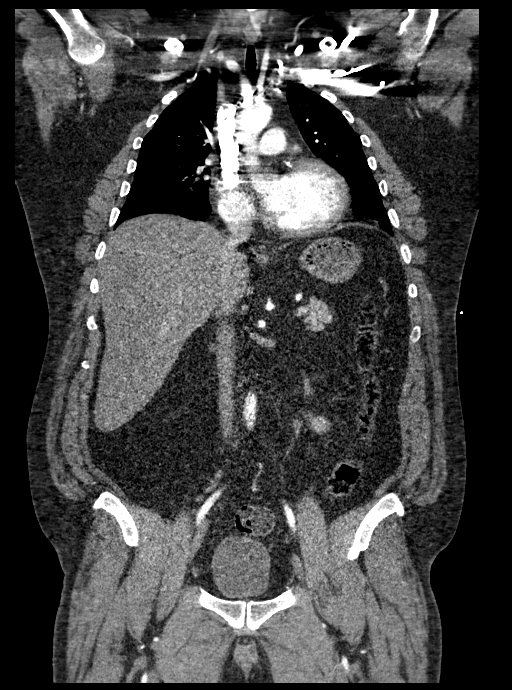
[im 165/220  soft-tissue]
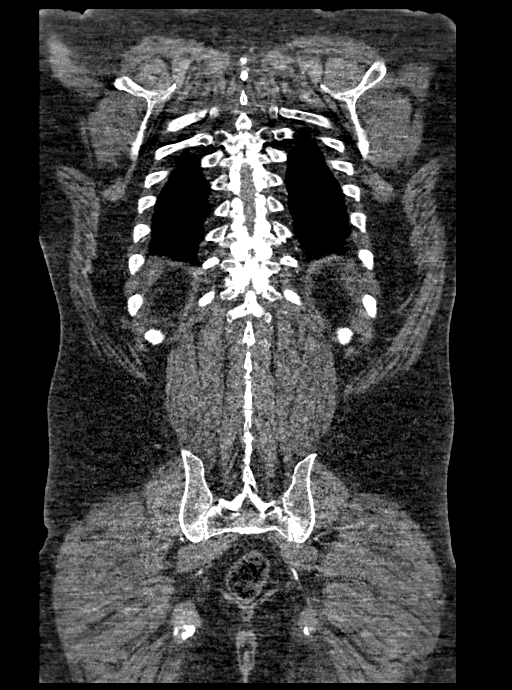

[14 of 46 positions shown; findings below may reference images not displayed]

FINDINGS: CTA CHEST FINDINGS

Cardiovascular: The heart size is borderline. Stents are identified
in the LAD and circumflex coronary arteries. The thoracic aorta is
nonaneurysmal with no significant atherosclerotic change. No
dissection. The central pulmonary arteries are unremarkable.

Mediastinum/Nodes: No enlarged mediastinal, hilar, or axillary lymph
nodes. Thyroid gland, trachea, and esophagus demonstrate no
significant findings.

Lungs/Pleura: Central airways are normal. No pneumothorax
identified. There is a nodule in the right mid lung on series 7,
image 32 and coronal image 130 measuring 5.3 mm, adjacent to the
fissure. No other suspicious nodules or masses. No focal infiltrates
identified.

Musculoskeletal: No chest wall abnormality. No acute or significant
osseous findings.

Review of the MIP images confirms the above findings.

CTA ABDOMEN AND PELVIS FINDINGS

VASCULAR

Aorta: Normal caliber aorta without aneurysm, dissection, vasculitis
or significant stenosis.

Celiac: Patent without evidence of aneurysm, dissection, vasculitis
or significant stenosis.

SMA: Patent without evidence of aneurysm, dissection, vasculitis or
significant stenosis.

Renals: Both renal arteries are patent without evidence of aneurysm,
dissection, vasculitis, fibromuscular dysplasia or significant
stenosis.

IMA: Patent without evidence of aneurysm, dissection, vasculitis or
significant stenosis.

Iliac vessels: Normal.

Inflow: Patent without evidence of aneurysm, dissection, vasculitis
or significant stenosis.

Veins: No obvious venous abnormality within the limitations of this
arterial phase study.

Review of the MIP images confirms the above findings.

NON-VASCULAR

Hepatobiliary: No focal liver abnormality is seen. No gallstones,
gallbladder wall thickening, or biliary dilatation.

Pancreas: Unremarkable. No pancreatic ductal dilatation or
surrounding inflammatory changes.

Spleen: Normal in size without focal abnormality.

Adrenals/Urinary Tract: There is a probable cyst in the upper pole
the left kidney, too small to characterize. The adrenal glands,
kidneys, ureters, and bladder are otherwise normal.

Stomach/Bowel: Stomach is within normal limits. Appendix appears
normal. No evidence of bowel wall thickening, distention, or
inflammatory changes.

Lymphatic: No significant vascular findings are present. No enlarged
abdominal or pelvic lymph nodes.

Reproductive: Prostate is unremarkable.

Other: There is a fat containing left inguinal hernia. There is a
fat containing umbilical hernia. No free air or free fluid.

Musculoskeletal: No acute or significant osseous findings.

Review of the MIP images confirms the above findings.
IMPRESSION: 1. No aneurysm or dissection in the thoracic or abdominal aorta.
2. Stents in the left circumflex and anterior descending coronary
arteries.
3. 5.3 mm nodule in the right lung. No follow-up needed if patient
is low-risk. Non-contrast chest CT can be considered in 12 months if
patient is high-risk. This recommendation follows the consensus
statement: Guidelines for Management of Incidental Pulmonary Nodules
Detected on CT Images: From the [HOSPITAL] 7809; Radiology

## 2021-01-28 ENCOUNTER — Encounter: Payer: Self-pay | Admitting: Cardiology

## 2021-01-28 ENCOUNTER — Other Ambulatory Visit: Payer: Self-pay

## 2021-01-28 ENCOUNTER — Ambulatory Visit (INDEPENDENT_AMBULATORY_CARE_PROVIDER_SITE_OTHER): Payer: BC Managed Care – PPO | Admitting: Cardiology

## 2021-01-28 ENCOUNTER — Other Ambulatory Visit: Payer: Self-pay | Admitting: Cardiology

## 2021-01-28 VITALS — BP 128/82 | HR 73 | Ht 75.0 in | Wt 365.0 lb

## 2021-01-28 DIAGNOSIS — I209 Angina pectoris, unspecified: Secondary | ICD-10-CM

## 2021-01-28 DIAGNOSIS — I10 Essential (primary) hypertension: Secondary | ICD-10-CM | POA: Diagnosis not present

## 2021-01-28 DIAGNOSIS — I2510 Atherosclerotic heart disease of native coronary artery without angina pectoris: Secondary | ICD-10-CM | POA: Diagnosis not present

## 2021-01-28 DIAGNOSIS — I255 Ischemic cardiomyopathy: Secondary | ICD-10-CM

## 2021-01-28 DIAGNOSIS — Z9861 Coronary angioplasty status: Secondary | ICD-10-CM

## 2021-01-28 DIAGNOSIS — I5022 Chronic systolic (congestive) heart failure: Secondary | ICD-10-CM

## 2021-01-28 LAB — TROPONIN T: Troponin T (Highly Sensitive): <6 ng/L (ref 0–22)

## 2021-01-28 MED ORDER — ISOSORBIDE MONONITRATE ER 60 MG PO TB24: 120.0000 mg | ORAL_TABLET | Freq: Every day | ORAL | 3 refills | Status: DC

## 2021-01-28 NOTE — Addendum Note (Signed)
Addended by: Orvan July on: 01/28/2021 08:35 AM   Modules accepted: Orders

## 2021-01-28 NOTE — Addendum Note (Signed)
Addended by: Orvan July on: 01/28/2021 10:27 AM   Modules accepted: Orders

## 2021-01-28 NOTE — Progress Notes (Signed)
Cardiology Office Note:    Date:  01/28/2021   ID:  Joe [name], DOB 04-Sep-1970, MRN [id number]  PCP:  Joe Bender, PA-C  Cardiologist:  Jenne Campus, MD    Referring MD: Joe Bender, PA-C   Chief Complaint  Patient presents with   Chest Pain   Dizziness   Fatigue   Shortness of Breath    History of Present Illness:    Joe [name] is a 50 y.o. male  with past medical history significant for coronary artery disease in March 2019 he required stenting to mid to distal LAD after that he was doing quite nicely but then started getting symptoms again did have a cardiac catheterization in January 2021 which showed 40% stenosis of distal LAD stents were patent, mid RCA had about 25% lesion PDA also got to 50% stenosis.  Circumflex was normal.  At the same time he was noted to have diminished left ventricle ejection fraction with echocardiogram showing ejection fraction 20 to 25%.  Eventually on March 19, 2019 he did receive CRT-D In January 2022 he did have a cardiac catheterization for atypical chest pain however did not show any target lesion for interventions. He requested to be seen because he simply does not feel well.  He described fatigue tiredness shortness of breath.  He was in [place] cruise couple weeks ago but he said he slept majority of time.  He did not have enough strength to do much.  Described to have some chest pain that happened at different situations sometimes at rest sometimes with exercise last for between 5 minutes about half an hour.  He did not have a chance to take nitroglycerin for it.  But profound symptoms if his fatigue tiredness and weakness.  Past Medical History:  Diagnosis Date   Abnormal EKG    Acute systolic heart failure ([place]) 09/06/2017   Angina pectoris ([place]) 09/05/2017   Body mass index 45.0-49.9, adult (HCC)    CAD S/P percutaneous coronary angioplasty 09/07/2017   mLAD and dLAD PCI with DES 09/06/17   CHF (congestive heart failure)  (HCC)    Chronic systolic (congestive) heart failure ([place]) [id number]   Complication of anesthesia 1995   "had hard time waking up; breathing w/vasectomy"   Coronary artery disease    Erectile dysfunction    Essential hypertension, benign    Fatigue    GERD (gastroesophageal reflux disease)    Gout    High cholesterol    "just started tx yesterday" (09/06/2017)   History of gout    "RX prn" (09/06/2017)   Ischemic cardiomyopathy 09/07/2017   EF 25-35%   Muscular chest pain    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   Below Left Nare ([facility])   Obesity    Obstructive sleep apnea    OSA on CPAP    Plantar fasciitis    Pre-operative clearance 02/11/2018   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/02/2019   [employer]   Right bundle branch block    Shortness of breath 09/05/2017   Testosterone deficiency    Type 2 diabetes mellitus with complication, without long-term current use of insulin ([place]) 09/05/2017   Type II diabetes mellitus ([place])    "started tx 08/28/2017"    Past Surgical History:  Procedure Laterality Date   BACK SURGERY     BIV ICD INSERTION CRT-D N/A 03/19/2019   Procedure: BIV ICD INSERTION CRT-D;  Surgeon: Constance Haw, MD;  Location: [facility];  Service: Cardiovascular;  Laterality: N/A;   CORONARY ANGIOPLASTY WITH STENT PLACEMENT  09/06/2017   "3 stents"   CORONARY STENT INTERVENTION N/A 09/06/2017   Procedure: CORONARY STENT INTERVENTION;  Surgeon: Jettie Booze, MD;  Location: [facility];  Service: Cardiovascular;  Laterality: N/A;   CORONARY STENT INTERVENTION N/A 02/15/2018   Procedure: CORONARY STENT INTERVENTION;  Surgeon: Jettie Booze, MD;  Location: [facility];  Service: Cardiovascular;  Laterality: N/A;   KNEE ARTHROSCOPY Right    LEFT HEART CATH AND CORONARY ANGIOGRAPHY N/A 09/06/2017   Procedure: LEFT HEART CATH AND CORONARY ANGIOGRAPHY;  Surgeon: Jettie Booze, MD;   Location: [facility];  Service: Cardiovascular;  Laterality: N/A;   LEFT HEART CATH AND CORONARY ANGIOGRAPHY N/A 02/15/2018   Procedure: LEFT HEART CATH AND CORONARY ANGIOGRAPHY;  Surgeon: Jettie Booze, MD;  Location: [facility];  Service: Cardiovascular;  Laterality: N/A;   LEFT HEART CATH AND CORONARY ANGIOGRAPHY N/A 06/27/2018   Procedure: LEFT HEART CATH AND CORONARY ANGIOGRAPHY;  Surgeon: Jettie Booze, MD;  Location: [facility];  Service: Cardiovascular;  Laterality: N/A;   LEFT HEART CATH AND CORONARY ANGIOGRAPHY N/A 06/28/2020   Procedure: LEFT HEART CATH AND CORONARY ANGIOGRAPHY;  Surgeon: Martinique, Peter M, MD;  Location: [facility];  Service: Cardiovascular;  Laterality: N/A;   LUMBAR SPINE SURGERY  ~ 2001   Intradiscal Electrothermoplasty   VASECTOMY  1995    Current Medications: Current Meds  Medication Sig   acetaminophen (TYLENOL) 500 MG tablet Take 1,000 mg by mouth every 6 (six) hours as needed for moderate pain or headache.   allopurinol (ZYLOPRIM) 100 MG tablet Take 100 mg by mouth daily as needed (for gout).    aspirin EC 81 MG tablet Take 1 tablet (81 mg total) by mouth daily.   carvedilol (COREG) 12.5 MG tablet Take 1 and 1/2 tablet twice daily. (Patient taking differently: Take 37.5 mg by mouth 2 (two) times daily with a meal. Take 1 and 1/2 tablet twice daily.)   colchicine 0.6 MG tablet Take 0.6 mg by mouth daily as needed (Gout).   dapagliflozin propanediol (FARXIGA) 10 MG TABS tablet Take 1 tablet (10 mg total) by mouth daily. Must keep further appointments for refills   digoxin (LANOXIN) 0.125 MG tablet Take 1 tablet (0.125 mg total) by mouth daily.   ezetimibe (ZETIA) 10 MG tablet Take 1 tablet (10 mg total) by mouth daily.   furosemide (LASIX) 40 MG tablet TAKE 1 TABLET BY MOUTH EVERY DAY (Patient taking differently: Take 40 mg by mouth daily.)   furosemide (LASIX) 80 MG tablet Take 80 mg by mouth daily.   halobetasol  (ULTRAVATE) 0.05 % cream Apply 1 application topically at bedtime as needed (Eczema).   ibuprofen (ADVIL) 200 MG tablet Take 800 mg by mouth every 8 (eight) hours as needed for headache or moderate pain.   indomethacin (INDOCIN) 50 MG capsule Take 50 mg by mouth daily as needed for mild pain or moderate pain (Gout).   isosorbide mononitrate (IMDUR) 60 MG 24 hr tablet Take 60 mg by mouth daily.   metFORMIN (GLUCOPHAGE-XR) 500 MG 24 hr tablet Take 2 tablets (1,000 mg total) by mouth in the morning and at bedtime.   mexiletine (MEXITIL) 200 MG capsule Take 1 capsule (200 mg total) by mouth 2 (two) times daily. Please make yearly appt with Dr. Curt Bears for April 2022 for future refills. Thank you 1st attempt   nitroGLYCERIN (NITROSTAT)  0.4 MG SL tablet Place 1 tablet (0.4 mg total) under the tongue every 5 (five) minutes as needed for chest pain.   omeprazole (PRILOSEC) 40 MG capsule Take 1 capsule (40 mg total) by mouth daily.   Polyethylene Glycol 400 (BLINK TEARS OP) Place 2 drops into both eyes 2 (two) times daily as needed (for dry eyes).   potassium chloride SA (KLOR-CON) 20 MEQ tablet Take 20 mEq by mouth daily.   ranolazine (RANEXA) 1000 MG SR tablet Take 1,000 mg by mouth 2 (two) times daily.   rosuvastatin (CRESTOR) 40 MG tablet Take 40 mg by mouth every evening.   sacubitril-valsartan (ENTRESTO) 97-103 MG Take 1 tablet by mouth 2 (two) times daily.   spironolactone (ALDACTONE) 25 MG tablet Take 12.5 mg by mouth daily. Take 1/2 daily     Allergies:   Patient has no known allergies.   Social History   Socioeconomic History   Marital status: Married    Spouse name: Not on file   Number of children: Not on file   Years of education: Not on file   Highest education level: Not on file  Occupational History   Not on file  Tobacco Use   Smoking status: Never   Smokeless tobacco: Never  Vaping Use   Vaping Use: Never used  Substance and Sexual Activity   Alcohol use: No   Drug use:  Never   Sexual activity: Not on file  Other Topics Concern   Not on file  Social History Narrative   Not on file   Social Determinants of Health   Financial Resource Strain: Not on file  Food Insecurity: Not on file  Transportation Needs: Not on file  Physical Activity: Not on file  Stress: Not on file  Social Connections: Not on file     Family History: The patient's family history includes Brain cancer in his mother; Diabetes Mellitus II in his brother and paternal grandfather; Lung cancer in his mother; Lymphoma in his father. ROS:   Please see the history of present illness.    All 14 point review of systems negative except as described per history of present illness  EKGs/Labs/Other Studies Reviewed:      Recent Labs: 06/23/2020: Hemoglobin 15.6; Platelets 253 08/06/2020: Magnesium 2.5 11/04/2020: NT-Pro BNP 76 11/11/2020: ALT 16; BUN 12; Creatinine, Ser 1.13; Potassium 4.3; Sodium 137; TSH 2.980  Recent Lipid Panel    Component Value Date/Time   CHOL 124 03/27/2018 0940   TRIG 218 (H) 03/27/2018 0940   HDL 35 (L) 03/27/2018 0940   CHOLHDL 3.5 03/27/2018 0940   LDLCALC 45 03/27/2018 0940    Physical Exam:    VS:  There were no vitals taken for this visit.    Wt Readings from Last 3 Encounters:  11/24/20 (!) 368 lb (166.9 kg)  11/11/20 (!) 367 lb (166.5 kg)  11/04/20 (!) 374 lb (169.6 kg)     GEN:  Well nourished, well developed in no acute distress HEENT: Normal NECK: No JVD; No carotid bruits LYMPHATICS: No lymphadenopathy CARDIAC: RRR, no murmurs, no rubs, no gallops RESPIRATORY:  Clear to auscultation without rales, wheezing or rhonchi  ABDOMEN: Soft, non-tender, non-distended MUSCULOSKELETAL:  No edema; No deformity  SKIN: Warm and dry LOWER EXTREMITIES: no swelling NEUROLOGIC:  Alert and oriented x 3 PSYCHIATRIC:  Normal affect   ASSESSMENT:    1. Angina pectoris ([place])   2. CAD S/P percutaneous coronary angioplasty   3. Chronic systolic  (congestive) heart failure (HCC)  4. Essential hypertension, benign   5. Ischemic cardiomyopathy    PLAN:    In order of problems listed above:  Angina pectoris.  He describes because of chest pain he is on good antianginal therapy.  I will ask him to have troponin I done today, also will check proBNP.  I will increase dose of Imdur from 60 to 120 mg daily.  In the future we may consider going up with beta-blocker. Coronary disease last cardiac catheterization reviewed again it was done in January of this year no target lesion for intervention.  I am afraid we will have to reevaluate him for reactivation of coronary artery disease.  If increasing dose of Imdur will make him feel better we have a situation under control if not he may require stress test.  If troponin I comes abnormal did not make cardiac catheterization required. Congestive heart failure appears to be compensated on physical exam.  I will check proBNP to make sure were not dealing with subclinical CHF Essential hypertension last time were dealing with low blood pressure today blood pressure is normal.  Continue present management Ischemic cardiomyopathy, noted, on appropriate medications. ICD present this is a CRT device.  interrogated 89% ventricular pacing which could be improved and I hope in the future I will be able to increase beta-blocker improve percentage of ventricular pacing.   Medication Adjustments/Labs and Tests Ordered: Current medicines are reviewed at length with the patient today.  Concerns regarding medicines are outlined above.  No orders of the defined types were placed in this encounter.  Medication changes: No orders of the defined types were placed in this encounter.   Signed, Park Liter, MD, [facility] 01/28/2021 8:11 AM    [place]

## 2021-01-28 NOTE — Patient Instructions (Signed)
Medication Instructions:  Your physician has recommended you make the following change in your medication:  START: Imdur 120 mg once a day *If you need a refill on your cardiac medications before your next appointment, please call your pharmacy*   Lab Work: Your physician recommends that you return for lab work in: TODAY: CMP, CBC, ProBNP If you have labs (blood work) drawn today and your tests are completely normal, you will receive your results only by: [place] (if you have [place]) OR A paper copy in the mail If you have any lab test that is abnormal or we need to change your treatment, we will call you to review the results.   Testing/Procedures: None   Follow-Up: At [facility], you and your health needs are our priority.  As part of our continuing mission to provide you with exceptional heart care, we have created designated Provider Care Teams.  These Care Teams include your primary Cardiologist (physician) and Advanced Practice Providers (APPs -  Physician Assistants and Nurse Practitioners) who all work together to provide you with the care you need, when you need it.  We recommend signing up for the patient portal called "MyChart".  Sign up information is provided on this After Visit Summary.  MyChart is used to connect with patients for Virtual Visits (Telemedicine).  Patients are able to view lab/test results, encounter notes, upcoming appointments, etc.  Non-urgent messages can be sent to your provider as well.   To learn more about what you can do with MyChart, go to [email or web address].    Your next appointment:   9/16 at 4:20 pm   The format for your next appointment:   In Person  Provider:   Jenne Campus, MD   Other Instructions

## 2021-01-29 LAB — COMPREHENSIVE METABOLIC PANEL
ALT: 24 IU/L (ref 0–44)
AST: 17 IU/L (ref 0–40)
Albumin/Globulin Ratio: 1.7 (ref 1.2–2.2)
Albumin: 4.3 g/dL (ref 4.0–5.0)
Alkaline Phosphatase: 92 IU/L (ref 44–121)
BUN/Creatinine Ratio: 13 (ref 9–20)
BUN: 16 mg/dL (ref 6–24)
Bilirubin Total: 0.4 mg/dL (ref 0.0–1.2)
CO2: 22 mmol/L (ref 20–29)
Calcium: 9.1 mg/dL (ref 8.7–10.2)
Chloride: 100 mmol/L (ref 96–106)
Creatinine, Ser: 1.2 mg/dL (ref 0.76–1.27)
Globulin, Total: 2.6 g/dL (ref 1.5–4.5)
Glucose: 247 mg/dL — ABNORMAL HIGH (ref 65–99)
Potassium: 4.5 mmol/L (ref 3.5–5.2)
Sodium: 140 mmol/L (ref 134–144)
Total Protein: 6.9 g/dL (ref 6.0–8.5)
eGFR: 74 mL/min/{1.73_m2} (ref >59)

## 2021-01-29 LAB — CBC WITH DIFFERENTIAL/PLATELET
Basophils Absolute: 0 10*3/uL (ref 0.0–0.2)
Basos: 0 %
EOS (ABSOLUTE): 0.2 10*3/uL (ref 0.0–0.4)
Eos: 3 %
Hematocrit: 45 % (ref 37.5–51.0)
Hemoglobin: 14.9 g/dL (ref 13.0–17.7)
Immature Grans (Abs): 0 10*3/uL (ref 0.0–0.1)
Immature Granulocytes: 0 %
Lymphocytes Absolute: 0.9 10*3/uL (ref 0.7–3.1)
Lymphs: 11 %
MCH: 30.8 pg (ref 26.6–33.0)
MCHC: 33.1 g/dL (ref 31.5–35.7)
MCV: 93 fL (ref 79–97)
Monocytes Absolute: 0.7 10*3/uL (ref 0.1–0.9)
Monocytes: 9 %
Neutrophils Absolute: 6 10*3/uL (ref 1.4–7.0)
Neutrophils: 77 %
Platelets: 213 10*3/uL (ref 150–450)
RBC: 4.84 x10E6/uL (ref 4.14–5.80)
RDW: 12.8 % (ref 11.6–15.4)
WBC: 7.9 10*3/uL (ref 3.4–10.8)

## 2021-01-29 LAB — PRO B NATRIURETIC PEPTIDE: NT-Pro BNP: 26 pg/mL (ref 0–121)

## 2021-02-01 ENCOUNTER — Telehealth: Payer: Self-pay | Admitting: Cardiology

## 2021-02-01 NOTE — Telephone Encounter (Signed)
PT wife would like results relayed via phone, not able to understand the ones listed in mychart... please advise

## 2021-02-01 NOTE — Telephone Encounter (Signed)
Spoke to the patients wife and let her know that once Dr. Agustin Cree reviews the results we will call them with the report. She verbalizes understanding.

## 2021-02-02 ENCOUNTER — Telehealth: Payer: Self-pay

## 2021-02-02 NOTE — Telephone Encounter (Signed)
Spoke with patient regarding results and recommendation.  Patient verbalizes understanding and is agreeable to plan of care. Advised patient to call back with any issues or concerns.  

## 2021-02-02 NOTE — Telephone Encounter (Signed)
Tried calling patient. No answer and no voicemail set up for me to leave a message. 

## 2021-02-02 NOTE — Telephone Encounter (Signed)
-----   Message from Park Liter, MD sent at 02/02/2021  3:49 PM EDT ----- His labs are good, did not show any fluid retention, there is no evidence of heart attack.  Continue as planned during the visit.

## 2021-02-09 NOTE — Progress Notes (Signed)
Remote ICD transmission.   

## 2021-02-13 ENCOUNTER — Other Ambulatory Visit: Payer: Self-pay

## 2021-02-14 MED ORDER — NITROGLYCERIN 0.4 MG SL SUBL: 0.4000 mg | SUBLINGUAL_TABLET | SUBLINGUAL | 6 refills | Status: DC | PRN

## 2021-02-14 NOTE — Telephone Encounter (Signed)
Nitrostat 0.4 mg SL # 25 x 6 refill sent to [facility] [place]

## 2021-03-04 ENCOUNTER — Ambulatory Visit (INDEPENDENT_AMBULATORY_CARE_PROVIDER_SITE_OTHER): Payer: BC Managed Care – PPO | Admitting: Cardiology

## 2021-03-04 ENCOUNTER — Encounter: Payer: Self-pay | Admitting: Cardiology

## 2021-03-04 ENCOUNTER — Emergency Department (HOSPITAL_BASED_OUTPATIENT_CLINIC_OR_DEPARTMENT_OTHER): Payer: BC Managed Care – PPO

## 2021-03-04 ENCOUNTER — Other Ambulatory Visit: Payer: Self-pay

## 2021-03-04 ENCOUNTER — Observation Stay (HOSPITAL_BASED_OUTPATIENT_CLINIC_OR_DEPARTMENT_OTHER)
Admission: EM | Admit: 2021-03-04 | Discharge: 2021-03-08 | Disposition: A | Discharge status: Home / Self Care | Payer: BC Managed Care – PPO | Attending: Family Medicine | Admitting: Family Medicine

## 2021-03-04 ENCOUNTER — Encounter (HOSPITAL_BASED_OUTPATIENT_CLINIC_OR_DEPARTMENT_OTHER): Payer: Self-pay | Admitting: Emergency Medicine

## 2021-03-04 VITALS — BP 136/66 | HR 167 | Ht 75.0 in | Wt 361.0 lb

## 2021-03-04 DIAGNOSIS — I5023 Acute on chronic systolic (congestive) heart failure: Secondary | ICD-10-CM | POA: Diagnosis not present

## 2021-03-04 DIAGNOSIS — E119 Type 2 diabetes mellitus without complications: Secondary | ICD-10-CM | POA: Insufficient documentation

## 2021-03-04 DIAGNOSIS — I110 Hypertensive heart disease with heart failure: Secondary | ICD-10-CM | POA: Insufficient documentation

## 2021-03-04 DIAGNOSIS — Z95810 Presence of automatic (implantable) cardiac defibrillator: Secondary | ICD-10-CM | POA: Diagnosis not present

## 2021-03-04 DIAGNOSIS — Z7984 Long term (current) use of oral hypoglycemic drugs: Secondary | ICD-10-CM | POA: Insufficient documentation

## 2021-03-04 DIAGNOSIS — I2510 Atherosclerotic heart disease of native coronary artery without angina pectoris: Secondary | ICD-10-CM

## 2021-03-04 DIAGNOSIS — I209 Angina pectoris, unspecified: Secondary | ICD-10-CM

## 2021-03-04 DIAGNOSIS — Z85828 Personal history of other malignant neoplasm of skin: Secondary | ICD-10-CM | POA: Insufficient documentation

## 2021-03-04 DIAGNOSIS — R002 Palpitations: Secondary | ICD-10-CM

## 2021-03-04 DIAGNOSIS — Z6841 Body Mass Index (BMI) 40.0 and over, adult: Secondary | ICD-10-CM | POA: Diagnosis not present

## 2021-03-04 DIAGNOSIS — Z79899 Other long term (current) drug therapy: Secondary | ICD-10-CM | POA: Diagnosis not present

## 2021-03-04 DIAGNOSIS — Z9861 Coronary angioplasty status: Secondary | ICD-10-CM

## 2021-03-04 DIAGNOSIS — I25110 Atherosclerotic heart disease of native coronary artery with unstable angina pectoris: Secondary | ICD-10-CM | POA: Diagnosis not present

## 2021-03-04 DIAGNOSIS — R079 Chest pain, unspecified: Secondary | ICD-10-CM | POA: Diagnosis present

## 2021-03-04 DIAGNOSIS — Z20822 Contact with and (suspected) exposure to covid-19: Secondary | ICD-10-CM | POA: Diagnosis not present

## 2021-03-04 DIAGNOSIS — G4733 Obstructive sleep apnea (adult) (pediatric): Secondary | ICD-10-CM

## 2021-03-04 DIAGNOSIS — E118 Type 2 diabetes mellitus with unspecified complications: Secondary | ICD-10-CM | POA: Diagnosis present

## 2021-03-04 DIAGNOSIS — Z9989 Dependence on other enabling machines and devices: Secondary | ICD-10-CM

## 2021-03-04 DIAGNOSIS — I5022 Chronic systolic (congestive) heart failure: Secondary | ICD-10-CM | POA: Diagnosis present

## 2021-03-04 DIAGNOSIS — I10 Essential (primary) hypertension: Secondary | ICD-10-CM | POA: Diagnosis present

## 2021-03-04 LAB — BASIC METABOLIC PANEL
Anion gap: 10 (ref 5–15)
BUN: 17 mg/dL (ref 6–20)
CO2: 23 mmol/L (ref 22–32)
Calcium: 8.9 mg/dL (ref 8.9–10.3)
Chloride: 103 mmol/L (ref 98–111)
Creatinine, Ser: 1.29 mg/dL — ABNORMAL HIGH (ref 0.61–1.24)
GFR, Estimated: >60 mL/min (ref >60)
Glucose, Bld: 228 mg/dL — ABNORMAL HIGH (ref 70–99)
Potassium: 3.9 mmol/L (ref 3.5–5.1)
Sodium: 136 mmol/L (ref 135–145)

## 2021-03-04 LAB — RESP PANEL BY RT-PCR (FLU A&B, COVID) ARPGX2
Influenza A by PCR: NEGATIVE
Influenza B by PCR: NEGATIVE
SARS Coronavirus 2 by RT PCR: NEGATIVE

## 2021-03-04 LAB — TROPONIN I (HIGH SENSITIVITY)
Troponin I (High Sensitivity): 4 ng/L (ref <18)
Troponin I (High Sensitivity): 4 ng/L (ref <18)

## 2021-03-04 LAB — CBC
HCT: 41.8 % (ref 39.0–52.0)
Hemoglobin: 14.7 g/dL (ref 13.0–17.0)
MCH: 31.7 pg (ref 26.0–34.0)
MCHC: 35.2 g/dL (ref 30.0–36.0)
MCV: 90.3 fL (ref 80.0–100.0)
Platelets: 240 10*3/uL (ref 150–400)
RBC: 4.63 MIL/uL (ref 4.22–5.81)
RDW: 12.4 % (ref 11.5–15.5)
WBC: 6.4 10*3/uL (ref 4.0–10.5)
nRBC: 0 % (ref 0.0–0.2)

## 2021-03-04 NOTE — ED Notes (Signed)
Pt provided meal and drink.  MD Pickering aware.

## 2021-03-04 NOTE — ED Triage Notes (Signed)
Reports heaviness in center of chest for a couple of weeks.  Pain radiating down right arm for the last two days.  Reports yesterday felt lightheaded and had blurred vision.  Took a nitro yesterday with no relief.

## 2021-03-04 NOTE — ED Notes (Signed)
First contact with patient. Patient arrived via triage from cardiology with complaints of sternal chest pressure radiating to right arm. Patient has had pain pain on and off with exertion x weeks. Pt is A&OX 4. Respirations even/unlabored at this time. Patient changed into gown and placed on cardiac monitor and call light within reach. Patient updated on plan of care. Report given to [facility].

## 2021-03-04 NOTE — ED Notes (Signed)
Pt ambulatory to bathroom, no assistance needed.  

## 2021-03-04 NOTE — Progress Notes (Signed)
Cardiology Office Note:    Date:  03/04/2021   ID:  Joe [name], DOB 1971-04-14, MRN [id number]  PCP:  Cyndi Bender, PA-C  Cardiologist:  Jenne Campus, MD    Referring MD: Cyndi Bender, PA-C   Chief Complaint  Patient presents with   Chest Pain    Chest heaviness, vision blurred, R arm pain nausea    History of Present Illness:    Joe [name] is a 51 y.o. male   with past medical history significant for coronary artery disease in March 2019 he required stenting to mid to distal LAD after that he was doing quite nicely but then started getting symptoms again did have a cardiac catheterization in January 2021 which showed 40% stenosis of distal LAD stents were patent, mid RCA had about 25% lesion PDA also got to 50% stenosis.  Circumflex was normal.  At the same time he was noted to have diminished left ventricle ejection fraction with echocardiogram showing ejection fraction 20 to 25%.  Eventually on March 19, 2019 he did receive CRT-D In January 2022 he did have a cardiac catheterization for atypical chest pain however did not show any target lesion for interventions. He requested to be seen because he feels miserable.  He said he had difficulty lasting through the day at work.  On top of that he complained of having chest pain interestingly he said that he got pain almost all the time sometimes pain get worse.  Actually yesterday at evening time he took nitroglycerin.  He almost never takes nitroglycerin.  Another issue is the fact that he experiencing palpitations.  My nurse who brought into the room recorded his rate being more than 140 however EKG done after that showed normal sinus rhythm.  Teller tells me that he feels palpitations couple times a day.  Also he feels dizzy when it happened.  He does have Abbott BiV device defibrillator, did not have any discharges from it.  I did review latest interrogation which is from August 1 of this year there was no detected  significant arrhythmia.  Past Medical History:  Diagnosis Date   Abnormal EKG    Acute systolic heart failure ([place]) 09/06/2017   Angina pectoris ([place]) 09/05/2017   Body mass index 45.0-49.9, adult (HCC)    CAD S/P percutaneous coronary angioplasty 09/07/2017   mLAD and dLAD PCI with DES 09/06/17   CHF (congestive heart failure) (HCC)    Chronic systolic (congestive) heart failure ([place]) XX123456   Complication of anesthesia 1995   "had hard time waking up; breathing w/vasectomy"   Coronary artery disease    Erectile dysfunction    Essential hypertension, benign    Fatigue    GERD (gastroesophageal reflux disease)    Gout    High cholesterol    "just started tx yesterday" (09/06/2017)   History of gout    "RX prn" (09/06/2017)   Ischemic cardiomyopathy 09/07/2017   EF 25-35%   Muscular chest pain    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   Below Left Nare (MOH's)   Obesity    Obstructive sleep apnea    OSA on CPAP    Plantar fasciitis    Pre-operative clearance 02/11/2018   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/02/2019   [employer]   Right bundle branch block    Shortness of breath 09/05/2017   Testosterone deficiency    Type 2 diabetes mellitus with complication, without long-term current use of insulin ([place]) 09/05/2017   Type  II diabetes mellitus ([place])    "started tx 08/28/2017"    Past Surgical History:  Procedure Laterality Date   BACK SURGERY     BIV ICD INSERTION CRT-D N/A 03/19/2019   Procedure: BIV ICD INSERTION CRT-D;  Surgeon: Constance Haw, MD;  Location: [facility];  Service: Cardiovascular;  Laterality: N/A;   CORONARY ANGIOPLASTY WITH STENT PLACEMENT  09/06/2017   "3 stents"   CORONARY STENT INTERVENTION N/A 09/06/2017   Procedure: CORONARY STENT INTERVENTION;  Surgeon: Jettie Booze, MD;  Location: [facility];  Service: Cardiovascular;  Laterality: N/A;   CORONARY STENT INTERVENTION N/A  02/15/2018   Procedure: CORONARY STENT INTERVENTION;  Surgeon: Jettie Booze, MD;  Location: [facility];  Service: Cardiovascular;  Laterality: N/A;   KNEE ARTHROSCOPY Right    LEFT HEART CATH AND CORONARY ANGIOGRAPHY N/A 09/06/2017   Procedure: LEFT HEART CATH AND CORONARY ANGIOGRAPHY;  Surgeon: Jettie Booze, MD;  Location: [facility];  Service: Cardiovascular;  Laterality: N/A;   LEFT HEART CATH AND CORONARY ANGIOGRAPHY N/A 02/15/2018   Procedure: LEFT HEART CATH AND CORONARY ANGIOGRAPHY;  Surgeon: Jettie Booze, MD;  Location: [facility];  Service: Cardiovascular;  Laterality: N/A;   LEFT HEART CATH AND CORONARY ANGIOGRAPHY N/A 06/27/2018   Procedure: LEFT HEART CATH AND CORONARY ANGIOGRAPHY;  Surgeon: Jettie Booze, MD;  Location: [facility];  Service: Cardiovascular;  Laterality: N/A;   LEFT HEART CATH AND CORONARY ANGIOGRAPHY N/A 06/28/2020   Procedure: LEFT HEART CATH AND CORONARY ANGIOGRAPHY;  Surgeon: Martinique, Peter M, MD;  Location: [facility];  Service: Cardiovascular;  Laterality: N/A;   LUMBAR SPINE SURGERY  ~ 2001   Intradiscal Electrothermoplasty   VASECTOMY  1995    Current Medications: No outpatient medications have been marked as taking for the 03/04/21 encounter (Office Visit) with Park Liter, MD.     Allergies:   Patient has no known allergies.   Social History   Socioeconomic History   Marital status: Married    Spouse name: Not on file   Number of children: Not on file   Years of education: Not on file   Highest education level: Not on file  Occupational History   Not on file  Tobacco Use   Smoking status: Never   Smokeless tobacco: Never  Vaping Use   Vaping Use: Never used  Substance and Sexual Activity   Alcohol use: No   Drug use: Never   Sexual activity: Not on file  Other Topics Concern   Not on file  Social History Narrative   Not on file   Social Determinants of Health    Financial Resource Strain: Not on file  Food Insecurity: Not on file  Transportation Needs: Not on file  Physical Activity: Not on file  Stress: Not on file  Social Connections: Not on file     Family History: The patient's family history includes Brain cancer in his mother; Diabetes Mellitus II in his brother and paternal grandfather; Lung cancer in his mother; Lymphoma in his father. ROS:   Please see the history of present illness.    All 14 point review of systems negative except as described per history of present illness  EKGs/Labs/Other Studies Reviewed:      Recent Labs: 08/06/2020: Magnesium 2.5 11/11/2020: TSH 2.980 01/28/2021: ALT 24; BUN 16; Creatinine, Ser 1.20; Hemoglobin 14.9; NT-Pro BNP 26; Platelets 213; Potassium 4.5; Sodium 140  Recent Lipid Panel    Component Value Date/Time   CHOL 124 03/27/2018 0940   TRIG 218 (H) 03/27/2018 0940   HDL 35 (L) 03/27/2018 0940   CHOLHDL 3.5 03/27/2018 0940   LDLCALC 45 03/27/2018 0940    Physical Exam:    VS:  BP 136/66 (BP Location: Left Arm, Patient Position: Sitting)   Pulse (!) 167   Ht '6\' 3"'$  (1.905 m)   Wt (!) 361 lb (163.7 kg)   SpO2 98%   BMI 45.12 kg/m     Wt Readings from Last 3 Encounters:  03/04/21 (!) 361 lb (163.7 kg)  01/28/21 (!) 365 lb (165.6 kg)  11/24/20 (!) 368 lb (166.9 kg)     GEN:  Well nourished, well developed in no acute distress HEENT: Normal NECK: No JVD; No carotid bruits LYMPHATICS: No lymphadenopathy CARDIAC: RRR, no murmurs, no rubs, no gallops RESPIRATORY:  Clear to auscultation without rales, wheezing or rhonchi  ABDOMEN: Soft, non-tender, non-distended MUSCULOSKELETAL:  No edema; No deformity  SKIN: Warm and dry LOWER EXTREMITIES: no swelling NEUROLOGIC:  Alert and oriented x 3 PSYCHIATRIC:  Normal affect   ASSESSMENT:    1. CAD S/P percutaneous coronary angioplasty   2. Angina pectoris (HCC)   3. Palpitations   4. Presence of cardiac resynchronization therapy  defibrillator (CRT-D)   5. Body mass index 45.0-49.9, adult [facility])    PLAN:    In order of problems listed above:  Coronary artery disease status postcardiac catheterization done last time in January of this year.  At that time no target lesion for intervention has been identified.  In spite of appropriate medical therapy he is still struggling still have episode of chest pain.  It he is gradually getting worse new problem right now is palpitation that he did not before.  I spoke to Delfino Lovett as well as with his wife and I think the best way to manage this problem is to admit him today emergency room and evaluate.  We need to evaluate for electrolytes we need to evaluate troponins.  He may even need to be admitted to reevaluate him again for coronary artery disease. Palpitations again plan will be to admit and monitor him ICD present this is BiV defibrillator by [employer], will make arrangements for interrogation of the device check if there is any significant arrhythmia recorded. Morbid obesity: Obviously contributing factor. Diabetes mellitus noted.   Medication Adjustments/Labs and Tests Ordered: Current medicines are reviewed at length with the patient today.  Concerns regarding medicines are outlined above.  No orders of the defined types were placed in this encounter.  Medication changes: No orders of the defined types were placed in this encounter.   Signed, Park Liter, MD, [facility] 03/04/2021 4:52 PM    [facility]

## 2021-03-04 NOTE — ED Provider Notes (Signed)
[facility] EMERGENCY DEPARTMENT Provider Note   CSN: [id number] Arrival date & time: 03/04/21  1651     History Chief Complaint  Patient presents with   Chest Pain    Joe [name] is a 50 y.o. male.   Chest Pain Associated symptoms: fatigue and shortness of breath   Associated symptoms: no abdominal pain, no back pain, no cough and no weakness   Patient presents with chest pain.  Anterior chest.  Feeling bad.  Does go to the right arm and is in the right upper chest.  Sent in by cardiology for further evaluation.  Has felt bad for "a while now.".  Has had decreased level of activity.  States the pain is almost always there.  States it feels like an elephant but sometimes with a small elephant.  Did take a nitroglycerin which she rarely does.  No response to the nitro.  States pain is not exertional.  States if he does something like mowing the lawn he is down for couple days.    Past Medical History:  Diagnosis Date   Abnormal EKG    Acute systolic heart failure ([place]) 09/06/2017   Angina pectoris ([place]) 09/05/2017   Body mass index 45.0-49.9, adult (HCC)    CAD S/P percutaneous coronary angioplasty 09/07/2017   mLAD and dLAD PCI with DES 09/06/17   CHF (congestive heart failure) (HCC)    Chronic systolic (congestive) heart failure ([place]) XX123456   Complication of anesthesia 1995   "had hard time waking up; breathing w/vasectomy"   Coronary artery disease    Erectile dysfunction    Essential hypertension, benign    Fatigue    GERD (gastroesophageal reflux disease)    Gout    High cholesterol    "just started tx yesterday" (09/06/2017)   History of gout    "RX prn" (09/06/2017)   Ischemic cardiomyopathy 09/07/2017   EF 25-35%   Muscular chest pain    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   Below Left Nare (MOH's)   Obesity    Obstructive sleep apnea    OSA on CPAP    Plantar fasciitis    Pre-operative clearance 02/11/2018   Presence of cardiac  resynchronization therapy defibrillator (CRT-D) 04/02/2019   [facility]   Right bundle branch block    Shortness of breath 09/05/2017   Testosterone deficiency    Type 2 diabetes mellitus with complication, without long-term current use of insulin ([place]) 09/05/2017   Type II diabetes mellitus ([place])    "started tx 08/28/2017"    Patient Active Problem List   Diagnosis Date Noted   Palpitations 03/04/2021   Body mass index 45.0-49.9, adult (HCC)    CHF (congestive heart failure) (HCC)    Fatigue    High cholesterol    History of gout    Muscular chest pain    OSA on CPAP    Plantar fasciitis    Type II diabetes mellitus ([place])    Presence of cardiac resynchronization therapy defibrillator (CRT-D) [id number]   Chronic systolic (congestive) heart failure ([place]) 03/19/2019   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   Coronary artery disease    Pre-operative clearance 02/11/2018   CAD S/P percutaneous coronary angioplasty 09/07/2017   Ischemic cardiomyopathy [id number]   Acute systolic heart failure ([place]) 09/06/2017   Angina pectoris ([place]) 09/05/2017   Shortness of breath 09/05/2017   Type 2 diabetes mellitus with complication, without long-term current use of insulin ([place]) 09/05/2017   Obstructive sleep apnea  GERD (gastroesophageal reflux disease)    Erectile dysfunction    Essential hypertension, benign    Obesity    Testosterone deficiency    Gout    Abnormal EKG    Right bundle branch block    Complication of anesthesia 1995    Past Surgical History:  Procedure Laterality Date   BACK SURGERY     BIV ICD INSERTION CRT-D N/A 03/19/2019   Procedure: BIV ICD INSERTION CRT-D;  Surgeon: Constance Haw, MD;  Location: [facility];  Service: Cardiovascular;  Laterality: N/A;   CORONARY ANGIOPLASTY WITH STENT PLACEMENT  09/06/2017   "3 stents"   CORONARY STENT INTERVENTION N/A 09/06/2017   Procedure: CORONARY STENT INTERVENTION;  Surgeon: Jettie Booze,  MD;  Location: [facility];  Service: Cardiovascular;  Laterality: N/A;   CORONARY STENT INTERVENTION N/A 02/15/2018   Procedure: CORONARY STENT INTERVENTION;  Surgeon: Jettie Booze, MD;  Location: [facility];  Service: Cardiovascular;  Laterality: N/A;   KNEE ARTHROSCOPY Right    LEFT HEART CATH AND CORONARY ANGIOGRAPHY N/A 09/06/2017   Procedure: LEFT HEART CATH AND CORONARY ANGIOGRAPHY;  Surgeon: Jettie Booze, MD;  Location: [facility];  Service: Cardiovascular;  Laterality: N/A;   LEFT HEART CATH AND CORONARY ANGIOGRAPHY N/A 02/15/2018   Procedure: LEFT HEART CATH AND CORONARY ANGIOGRAPHY;  Surgeon: Jettie Booze, MD;  Location: [facility];  Service: Cardiovascular;  Laterality: N/A;   LEFT HEART CATH AND CORONARY ANGIOGRAPHY N/A 06/27/2018   Procedure: LEFT HEART CATH AND CORONARY ANGIOGRAPHY;  Surgeon: Jettie Booze, MD;  Location: [facility];  Service: Cardiovascular;  Laterality: N/A;   LEFT HEART CATH AND CORONARY ANGIOGRAPHY N/A 06/28/2020   Procedure: LEFT HEART CATH AND CORONARY ANGIOGRAPHY;  Surgeon: Martinique, Peter M, MD;  Location: [facility];  Service: Cardiovascular;  Laterality: N/A;   LUMBAR SPINE SURGERY  ~ 2001   Intradiscal Electrothermoplasty   VASECTOMY  1995       Family History  Problem Relation Age of Onset   Lymphoma Father    Diabetes Mellitus II Brother    Diabetes Mellitus II Paternal Grandfather    Lung cancer Mother    Brain cancer Mother     Social History   Tobacco Use   Smoking status: Never   Smokeless tobacco: Never  Vaping Use   Vaping Use: Never used  Substance Use Topics   Alcohol use: No   Drug use: Never    Home Medications Prior to Admission medications   Medication Sig Start Date End Date Taking? Authorizing Provider  acetaminophen (TYLENOL) 500 MG tablet Take 1,000 mg by mouth every 6 (six) hours as needed for moderate pain or headache.    [provider]  allopurinol (ZYLOPRIM) 100 MG tablet Take 100 mg by mouth daily as needed (for gout).     [provider]  aspirin EC 81 MG tablet Take 1 tablet (81 mg total) by mouth daily. 09/05/17   Park Liter, MD  carvedilol (COREG) 12.5 MG tablet Take 1 and 1/2 tablet twice daily. Patient taking differently: Take 37.5 mg by mouth 2 (two) times daily with a meal. Take 1 and 1/2 tablet twice daily. 05/05/20   Park Liter, MD  colchicine 0.6 MG tablet Take 0.6 mg by mouth daily as needed (Gout). 05/10/20   [provider]  dapagliflozin propanediol (FARXIGA) 10 MG TABS tablet Take 1 tablet (10 mg  total) by mouth daily. Must keep further appointments for refills 01/10/21   Bensimhon, Shaune Pascal, MD  digoxin (LANOXIN) 0.125 MG tablet Take 1 tablet (0.125 mg total) by mouth daily. 02/04/20   Bensimhon, Shaune Pascal, MD  ezetimibe (ZETIA) 10 MG tablet TAKE 1 TABLET BY MOUTH EVERY DAY 01/31/21   Park Liter, MD  furosemide (LASIX) 40 MG tablet TAKE 1 TABLET BY MOUTH EVERY DAY Patient taking differently: Take 40 mg by mouth daily. 01/10/21   Park Liter, MD  furosemide (LASIX) 80 MG tablet Take 80 mg by mouth daily.    [provider]  halobetasol (ULTRAVATE) 0.05 % cream Apply 1 application topically at bedtime as needed (Eczema).    [provider]  ibuprofen (ADVIL) 200 MG tablet Take 800 mg by mouth every 8 (eight) hours as needed for headache or moderate pain.    [provider]  indomethacin (INDOCIN) 50 MG capsule Take 50 mg by mouth daily as needed for mild pain or moderate pain (Gout). 03/11/19   [provider]  isosorbide mononitrate (IMDUR) 60 MG 24 hr tablet Take 2 tablets (120 mg total) by mouth daily. 01/28/21 04/28/21  Park Liter, MD  metFORMIN (GLUCOPHAGE-XR) 500 MG 24 hr tablet Take 2 tablets (1,000 mg total) by mouth in the morning and at bedtime. 07/01/20   Martinique, Peter M, MD  mexiletine (MEXITIL) 200 MG capsule  Take 1 capsule (200 mg total) by mouth 2 (two) times daily. Please make yearly appt with Dr. Curt Bears for April 2022 for future refills. Thank you 1st attempt 09/15/20   Constance Haw, MD  nitroGLYCERIN (NITROSTAT) 0.4 MG SL tablet Place 1 tablet (0.4 mg total) under the tongue every 5 (five) minutes as needed for chest pain. 02/14/21 05/15/21  Park Liter, MD  omeprazole (PRILOSEC) 40 MG capsule Take 1 capsule (40 mg total) by mouth daily. 01/14/19   Park Liter, MD  Polyethylene Glycol 400 (BLINK TEARS OP) Place 2 drops into both eyes 2 (two) times daily as needed (for dry eyes).    [provider]  potassium chloride SA (KLOR-CON) 20 MEQ tablet Take 20 mEq by mouth daily.    [provider]  ranolazine (RANEXA) 1000 MG SR tablet Take 1,000 mg by mouth 2 (two) times daily.    [provider]  rosuvastatin (CRESTOR) 40 MG tablet Take 40 mg by mouth every evening.    [provider]  sacubitril-valsartan (ENTRESTO) 97-103 MG Take 1 tablet by mouth 2 (two) times daily.    [provider]  spironolactone (ALDACTONE) 25 MG tablet Take 12.5 mg by mouth daily. Take 1/2 daily    [provider]    Allergies    Patient has no known allergies.  Review of Systems   Review of Systems  Constitutional:  Positive for fatigue.  HENT:  Negative for congestion.   Respiratory:  Positive for shortness of breath. Negative for cough.   Cardiovascular:  Positive for chest pain.  Gastrointestinal:  Negative for abdominal pain.  Genitourinary:  Negative for flank pain.  Musculoskeletal:  Negative for back pain.  Skin:  Negative for rash.  Neurological:  Negative for weakness.   Physical Exam Updated Vital Signs BP 127/67   Pulse 71   Resp 17   Ht '6\' 3"'$  (1.905 m)   Wt (!) 163.7 kg   SpO2 94%   BMI 45.12 kg/m   Physical Exam Vitals and nursing note reviewed.  Constitutional:  Appearance: He is well-developed.   Cardiovascular:     Rate and Rhythm: Normal rate and regular rhythm.  Pulmonary:     Breath sounds: No wheezing.  Chest:     Comments: Pacemaker left upper chest wall. Mild tenderness right upper chest wall. No rash.  Abdominal:     Tenderness: There is no abdominal tenderness.  Musculoskeletal:     Cervical back: Normal range of motion.     Left lower leg: No edema.  Skin:    General: Skin is warm.     Capillary Refill: Capillary refill takes less than 2 seconds.  Neurological:     Mental Status: He is alert.    ED Results / Procedures / Treatments   Labs (all labs ordered are listed, but only abnormal results are displayed) Labs Reviewed  BASIC METABOLIC PANEL - Abnormal; Notable for the following components:      Result Value   Glucose, Bld 228 (*)    Creatinine, Ser 1.29 (*)    All other components within normal limits  CBC  TROPONIN I (HIGH SENSITIVITY)  TROPONIN I (HIGH SENSITIVITY)    EKG EKG Interpretation  Date/Time:  Friday March 04 2021 16:59:48 EDT Ventricular Rate:  80 PR Interval:  210 QRS Duration: 170 QT Interval:  464 QTC Calculation: 535 R Axis:   -72 Text Interpretation: Atrial-sensed ventricular-paced rhythm with prolonged AV conduction Biventricular pacemaker detected Abnormal ECG Confirmed by Davonna Belling [phone]) on 03/04/2021 5:32:37 PM  Radiology DG Chest 2 View  Result Date: 03/04/2021 CLINICAL DATA:  Chest pain EXAM: CHEST - 2 VIEW COMPARISON:  03/20/2019 FINDINGS: The heart size and mediastinal contours are within normal limits. Left chest multi lead pacer. Both lungs are clear. The visualized skeletal structures are unremarkable. IMPRESSION: No acute abnormality of the lungs. Electronically Signed   By: Eddie Candle M.D.   On: 03/04/2021 18:30    Procedures Procedures   Medications Ordered in ED Medications - No data to display  ED Course  I have reviewed the triage vital signs and the nursing notes.  Pertinent labs &  imaging results that were available during my care of the patient were reviewed by me and considered in my medical decision making (see chart for details).    MDM Rules/Calculators/A&P                           Patient presents with chest pain.  Anterior chest.  Upper chest.  Limited activity because of it but not exertional.  Has been coming and going in intensity but states it is not exertion that makes it worse.  EKG paced.  Reportedly had a heart rate of 160 upon arriving to cardiology office but was not caught on the EKG there.  Has an Abbott pacemaker that cannot be interrogated here.  Discussed with Dr. Agustin Cree, the patient's cardiologist.  Request admission to the hospital for further work-up.  Will discuss with hospitalist for primary admission. Final Clinical Impression(s) / ED Diagnoses Final diagnoses:  Chest pain, unspecified type    Rx / DC Orders ED Discharge Orders     None        Davonna Belling, MD 03/04/21 1950

## 2021-03-04 NOTE — ED Notes (Signed)
Pt alert, resting, wife at bedside. No needs expressed.  Reports Chest pain of 2/10, described as dull pressure. Call bell within reach. Will continue to monitor.

## 2021-03-05 ENCOUNTER — Encounter (HOSPITAL_COMMUNITY): Payer: Self-pay | Admitting: Family Medicine

## 2021-03-05 DIAGNOSIS — I2510 Atherosclerotic heart disease of native coronary artery without angina pectoris: Secondary | ICD-10-CM | POA: Diagnosis not present

## 2021-03-05 DIAGNOSIS — R079 Chest pain, unspecified: Secondary | ICD-10-CM | POA: Diagnosis not present

## 2021-03-05 DIAGNOSIS — I255 Ischemic cardiomyopathy: Secondary | ICD-10-CM

## 2021-03-05 DIAGNOSIS — E118 Type 2 diabetes mellitus with unspecified complications: Secondary | ICD-10-CM

## 2021-03-05 DIAGNOSIS — I25119 Atherosclerotic heart disease of native coronary artery with unspecified angina pectoris: Secondary | ICD-10-CM

## 2021-03-05 DIAGNOSIS — I10 Essential (primary) hypertension: Secondary | ICD-10-CM | POA: Diagnosis not present

## 2021-03-05 DIAGNOSIS — R002 Palpitations: Secondary | ICD-10-CM

## 2021-03-05 DIAGNOSIS — I5022 Chronic systolic (congestive) heart failure: Secondary | ICD-10-CM | POA: Diagnosis not present

## 2021-03-05 LAB — GLUCOSE, CAPILLARY
Glucose-Capillary: 142 mg/dL — ABNORMAL HIGH (ref 70–99)
Glucose-Capillary: 148 mg/dL — ABNORMAL HIGH (ref 70–99)
Glucose-Capillary: 150 mg/dL — ABNORMAL HIGH (ref 70–99)
Glucose-Capillary: 165 mg/dL — ABNORMAL HIGH (ref 70–99)
Glucose-Capillary: 187 mg/dL — ABNORMAL HIGH (ref 70–99)
Glucose-Capillary: 197 mg/dL — ABNORMAL HIGH (ref 70–99)
Glucose-Capillary: 198 mg/dL — ABNORMAL HIGH (ref 70–99)

## 2021-03-05 LAB — HIV ANTIBODY (ROUTINE TESTING W REFLEX): HIV Screen 4th Generation wRfx: NONREACTIVE

## 2021-03-05 LAB — HEMOGLOBIN A1C
Hgb A1c MFr Bld: 7.8 % — ABNORMAL HIGH (ref 4.8–5.6)
Mean Plasma Glucose: 177.16 mg/dL

## 2021-03-05 MED ORDER — ROSUVASTATIN CALCIUM 20 MG PO TABS
40.0000 mg | ORAL_TABLET | Freq: Every day | ORAL | Status: DC
  Administered 2021-03-05 – 2021-03-08 (×4): 40 mg via ORAL
  Filled 2021-03-05 (×4): qty 2

## 2021-03-05 MED ORDER — INSULIN ASPART 100 UNIT/ML IJ SOLN
0.0000 [IU] | INTRAMUSCULAR | Status: DC
  Administered 2021-03-05: 1 [IU] via SUBCUTANEOUS
  Administered 2021-03-05 – 2021-03-06 (×5): 2 [IU] via SUBCUTANEOUS
  Administered 2021-03-06: 1 [IU] via SUBCUTANEOUS
  Administered 2021-03-06: 2 [IU] via SUBCUTANEOUS
  Administered 2021-03-06: 3 [IU] via SUBCUTANEOUS
  Administered 2021-03-06 – 2021-03-07 (×3): 2 [IU] via SUBCUTANEOUS
  Administered 2021-03-07: 1 [IU] via SUBCUTANEOUS
  Administered 2021-03-07 – 2021-03-08 (×4): 2 [IU] via SUBCUTANEOUS
  Administered 2021-03-08: 3 [IU] via SUBCUTANEOUS
  Administered 2021-03-08: 2 [IU] via SUBCUTANEOUS

## 2021-03-05 MED ORDER — ENOXAPARIN SODIUM 40 MG/0.4ML IJ SOSY
40.0000 mg | PREFILLED_SYRINGE | Freq: Every day | INTRAMUSCULAR | Status: DC
  Administered 2021-03-05 – 2021-03-06 (×3): 40 mg via SUBCUTANEOUS
  Filled 2021-03-05 (×3): qty 0.4

## 2021-03-05 MED ORDER — ASPIRIN EC 81 MG PO TBEC
81.0000 mg | DELAYED_RELEASE_TABLET | Freq: Every day | ORAL | Status: DC
  Administered 2021-03-05 – 2021-03-06 (×2): 81 mg via ORAL
  Filled 2021-03-05 (×2): qty 1

## 2021-03-05 MED ORDER — EZETIMIBE 10 MG PO TABS
10.0000 mg | ORAL_TABLET | Freq: Every day | ORAL | Status: DC
  Administered 2021-03-05 – 2021-03-08 (×4): 10 mg via ORAL
  Filled 2021-03-05 (×4): qty 1

## 2021-03-05 MED ORDER — ACETAMINOPHEN 325 MG PO TABS: 650.0000 mg | ORAL_TABLET | ORAL | Status: DC | PRN

## 2021-03-05 MED ORDER — ONDANSETRON HCL 4 MG/2ML IJ SOLN: 4.0000 mg | Freq: Four times a day (QID) | INTRAMUSCULAR | Status: DC | PRN

## 2021-03-05 MED ORDER — CARVEDILOL 6.25 MG PO TABS
6.2500 mg | ORAL_TABLET | Freq: Two times a day (BID) | ORAL | Status: DC
  Administered 2021-03-05 – 2021-03-08 (×6): 6.25 mg via ORAL
  Filled 2021-03-05 (×6): qty 1

## 2021-03-05 MED ORDER — MEXILETINE HCL 200 MG PO CAPS
200.0000 mg | ORAL_CAPSULE | Freq: Two times a day (BID) | ORAL | Status: DC
  Administered 2021-03-05 – 2021-03-08 (×7): 200 mg via ORAL
  Filled 2021-03-05 (×8): qty 1

## 2021-03-05 NOTE — Progress Notes (Addendum)
    Device interrogation with no VT/VF no shocks, 4% PVC burden. No AF but parameters set for 170bpm, therefore HR in the office was not high enough to capture.   -- resume coreg 6.'25mg'$  BID, mexiletine '200mg'$  BID -- echo -- continue to monitor on telemetry, will follow up tomorrow to determine if further ischemic evaluation is needed  Signed, Reino Bellis, NP-C 03/05/2021, 10:47 AM Pager: [phone]

## 2021-03-05 NOTE — Consult Note (Addendum)
Cardiology Consultation:   Patient ID: Joe [name] MRN: [id number]; DOB: 04-08-1971  Admit date: 03/04/2021 Date of Consult: 03/05/2021  PCP:  Cyndi Bender, [facility] Providers Cardiologist:  Jenne Campus, MD  Electrophysiologist:  Constance Haw, MD  {  Patient Profile:   Joe [name] is a 50 y.o. male with a hx of systolic HF (XX123456), CAD DM, HTN, HL, OSA, multifocal PVCs who is being seen 03/05/2021 for the evaluation of chest pain/dyspnea at the request of Dr. Alcario Drought.  History of Present Illness:   Joe [name] is a 50 yo male with PMH noted above. He has been followed by Dr. Agustin Cree as an outpatient and intermittently in the  AH[facility] clinic. Underwent cardiac cath in 08/2017 with severe mLAD disease treated with PCI/DESx3, dRCA 50%, mLCx 50% treated medically. Myoview in 05/2018 with EF of 30% apical and infero-apical scar.  Echo 12/2018 with EF of 20-25%, moderate RV dysfunction.   Cardiac cath several months 01/2018 with patent stents in the LAD with new 75% mLcx and 80% pLcx treated with DES x2. Continued on DAPT with ASA/Brilinta. Repeat cath in 06/2018 with all stents being patent, no intervention.   Underwent ICD placement in 02/2019 with Dr. Curt Bears [facility] Jude) and followed as an outpatient.   Seen in the AHF with Dr. Haroldine Laws for titration of HF therapy medications. Question raised over mixed HF etiology with CAD as well as OSA/DM. He was continued on coreg, entresto, spiro, lasix with farxiga added. Ordered for a home sleep study.  Seen by Dr. Curt Bears for multifocal PVCs, high burden. This was reducing his BiV placing Placed on mexiletine   Evaluated by Dr. Radford Pax for OSA 07/2019 with recommendation for formal sleep study in the sleep lab to be fitted for full face mask.   Most recent cardiac cath 06/2020 for atypical chest pain with patent stents in the Lcx and LAD. No new targets. Echo 30-35% with severe global hypokinesis.   Was seen on the office  9/16 by Dr. Agustin Cree with complaints of chest pain and palpitations. Noted indicate his HR was elevated in the 140s. EKG done in the office noted NSR, atrial sensed, v paced. Reported palpitations and dizziness as well. Last interrogation from 8/1 with no arrhythmias. Given his on-going chest pain in the office it was advised that he present to the ED for further evaluation.   In the ED his labs showed stable electrolytes, Cr 1.29, hsTn 4>>4, WBC 6.4, Hgb 14.7. EKG AV-paced, RBBB. CXR negative. Admitted to IM for further management. Cardiology consulted.  In talking with patient he has had several intermittent episodes of chest pain/pressure over the past few days. Also with episodes of palpitations that last a couple of seconds at a time. Intermittent low bps in the 123XX123 systolic but reports compliance with all home medications.   Past Medical History:  Diagnosis Date   Abnormal EKG    Acute systolic heart failure ([place]) 09/06/2017   Angina pectoris ([place]) 09/05/2017   Body mass index 45.0-49.9, adult (HCC)    CAD S/P percutaneous coronary angioplasty 09/07/2017   mLAD and dLAD PCI with DES 09/06/17   CHF (congestive heart failure) (HCC)    Chronic systolic (congestive) heart failure ([place]) [id number]   Complication of anesthesia 1995   "had hard time waking up; breathing w/vasectomy"   Coronary artery disease    Erectile dysfunction    Essential hypertension, benign    Fatigue    GERD (gastroesophageal reflux disease)  Gout    High cholesterol    "just started tx yesterday" (09/06/2017)   History of gout    "RX prn" (09/06/2017)   Ischemic cardiomyopathy 09/07/2017   EF 25-35%   Muscular chest pain    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   Below Left Nare (MOH's)   Obesity    Obstructive sleep apnea    OSA on CPAP    Plantar fasciitis    Pre-operative clearance 02/11/2018   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/02/2019   Saint Jude   Right  bundle branch block    Shortness of breath 09/05/2017   Testosterone deficiency    Type 2 diabetes mellitus with complication, without long-term current use of insulin ([place]) 09/05/2017   Type II diabetes mellitus ([place])    "started tx 08/28/2017"    Past Surgical History:  Procedure Laterality Date   BACK SURGERY     BIV ICD INSERTION CRT-D N/A 03/19/2019   Procedure: BIV ICD INSERTION CRT-D;  Surgeon: Constance Haw, MD;  Location: [place] [facility];  Service: Cardiovascular;  Laterality: N/A;   CORONARY ANGIOPLASTY WITH STENT PLACEMENT  09/06/2017   "3 stents"   CORONARY STENT INTERVENTION N/A 09/06/2017   Procedure: CORONARY STENT INTERVENTION;  Surgeon: Jettie Booze, MD;  Location: [place] [facility];  Service: Cardiovascular;  Laterality: N/A;   CORONARY STENT INTERVENTION N/A 02/15/2018   Procedure: CORONARY STENT INTERVENTION;  Surgeon: Jettie Booze, MD;  Location: [place] [facility];  Service: Cardiovascular;  Laterality: N/A;   KNEE ARTHROSCOPY Right    LEFT HEART CATH AND CORONARY ANGIOGRAPHY N/A 09/06/2017   Procedure: LEFT HEART CATH AND CORONARY ANGIOGRAPHY;  Surgeon: Jettie Booze, MD;  Location: [place] [facility];  Service: Cardiovascular;  Laterality: N/A;   LEFT HEART CATH AND CORONARY ANGIOGRAPHY N/A 02/15/2018   Procedure: LEFT HEART CATH AND CORONARY ANGIOGRAPHY;  Surgeon: Jettie Booze, MD;  Location: [place] [facility];  Service: Cardiovascular;  Laterality: N/A;   LEFT HEART CATH AND CORONARY ANGIOGRAPHY N/A 06/27/2018   Procedure: LEFT HEART CATH AND CORONARY ANGIOGRAPHY;  Surgeon: Jettie Booze, MD;  Location: [place] [facility];  Service: Cardiovascular;  Laterality: N/A;   LEFT HEART CATH AND CORONARY ANGIOGRAPHY N/A 06/28/2020   Procedure: LEFT HEART CATH AND CORONARY ANGIOGRAPHY;  Surgeon: Martinique, Peter M, MD;  Location: [place] [facility];  Service: Cardiovascular;  Laterality: N/A;   LUMBAR SPINE SURGERY  ~ 2001    Intradiscal Electrothermoplasty   VASECTOMY  1995     Home Medications:  Prior to Admission medications   Medication Sig Start Date End Date Taking? Authorizing Provider  acetaminophen (TYLENOL) 500 MG tablet Take 1,000 mg by mouth every 6 (six) hours as needed for moderate pain or headache.   Yes [provider]  allopurinol (ZYLOPRIM) 100 MG tablet Take 100 mg by mouth daily as needed (for gout).    Yes [provider]  aspirin EC 81 MG tablet Take 1 tablet (81 mg total) by mouth daily. 09/05/17  Yes Park Liter, MD  carvedilol (COREG) 12.5 MG tablet Take 1 and 1/2 tablet twice daily. Patient taking differently: Take 18.75 mg by mouth 2 (two) times daily with a meal. 05/05/20  Yes Park Liter, MD  colchicine 0.6 MG tablet Take 0.6 mg by mouth daily as needed (Gout). 05/10/20  Yes [provider]  dapagliflozin propanediol (FARXIGA) 10 MG TABS tablet Take 1 tablet (10  mg total) by mouth daily. Must keep further appointments for refills Patient taking differently: Take 10 mg by mouth daily. 01/10/21  Yes Bensimhon, Shaune Pascal, MD  digoxin (LANOXIN) 0.125 MG tablet Take 1 tablet (0.125 mg total) by mouth daily. 02/04/20  Yes Bensimhon, Shaune Pascal, MD  ezetimibe (ZETIA) 10 MG tablet TAKE 1 TABLET BY MOUTH EVERY DAY Patient taking differently: Take 10 mg by mouth daily. 01/31/21  Yes Park Liter, MD  furosemide (LASIX) 40 MG tablet TAKE 1 TABLET BY MOUTH EVERY DAY Patient taking differently: Take 80 mg by mouth daily. 01/10/21  Yes Park Liter, MD  halobetasol (ULTRAVATE) 0.05 % cream Apply 1 application topically at bedtime as needed (Eczema).   Yes [provider]  ibuprofen (ADVIL) 200 MG tablet Take 800 mg by mouth every 8 (eight) hours as needed for headache or moderate pain.   Yes [provider]  indomethacin (INDOCIN) 50 MG capsule Take 50 mg by mouth daily as needed for mild pain or moderate pain (Gout). 03/11/19  Yes  [provider]  isosorbide mononitrate (IMDUR) 60 MG 24 hr tablet Take 2 tablets (120 mg total) by mouth daily. 01/28/21 04/28/21 Yes Park Liter, MD  metFORMIN (GLUCOPHAGE-XR) 500 MG 24 hr tablet Take 2 tablets (1,000 mg total) by mouth in the morning and at bedtime. 07/01/20  Yes Martinique, Peter M, MD  mexiletine (MEXITIL) 200 MG capsule Take 1 capsule (200 mg total) by mouth 2 (two) times daily. Please make yearly appt with Dr. Curt Bears for April 2022 for future refills. Thank you 1st attempt Patient taking differently: Take 200 mg by mouth 2 (two) times daily. 09/15/20  Yes Camnitz, Ocie Doyne, MD  nitroGLYCERIN (NITROSTAT) 0.4 MG SL tablet Place 1 tablet (0.4 mg total) under the tongue every 5 (five) minutes as needed for chest pain. 02/14/21 05/15/21 Yes Park Liter, MD  omeprazole (PRILOSEC) 40 MG capsule Take 1 capsule (40 mg total) by mouth daily. 01/14/19  Yes Park Liter, MD  Polyethylene Glycol 400 (BLINK TEARS OP) Place 2 drops into both eyes 2 (two) times daily as needed (for dry eyes).   Yes [provider]  potassium chloride SA (KLOR-CON) 20 MEQ tablet Take 20 mEq by mouth daily.   Yes [provider]  ranolazine (RANEXA) 1000 MG SR tablet Take 1,000 mg by mouth 2 (two) times daily.   Yes [provider]  rosuvastatin (CRESTOR) 40 MG tablet Take 40 mg by mouth every evening.   Yes [provider]  sacubitril-valsartan (ENTRESTO) 97-103 MG Take 1 tablet by mouth 2 (two) times daily.   Yes [provider]  spironolactone (ALDACTONE) 25 MG tablet Take 12.5 mg by mouth daily.   Yes [provider]    Inpatient Medications: Scheduled Meds:  enoxaparin (LOVENOX) injection  40 mg Subcutaneous QHS   insulin aspart  0-9 Units Subcutaneous Q4H   rosuvastatin  40 mg Oral Daily   Continuous Infusions:  PRN Meds: acetaminophen, ondansetron (ZOFRAN) IV  Allergies:   No Known Allergies  Social History:    Social History   Socioeconomic History   Marital status: Married    Spouse name: Not on file   Number of children: Not on file   Years of education: Not on file   Highest education level: Not on file  Occupational History   Not on file  Tobacco Use   Smoking status: Never   Smokeless tobacco: Never  Vaping Use   Vaping  Use: Never used  Substance and Sexual Activity   Alcohol use: No   Drug use: Never   Sexual activity: Not on file  Other Topics Concern   Not on file  Social History Narrative   Not on file   Social Determinants of Health   Financial Resource Strain: Not on file  Food Insecurity: Not on file  Transportation Needs: Not on file  Physical Activity: Not on file  Stress: Not on file  Social Connections: Not on file  Intimate Partner Violence: Not on file    Family History:    Family History  Problem Relation Age of Onset   Lymphoma Father    Diabetes Mellitus II Brother    Diabetes Mellitus II Paternal Grandfather    Lung cancer Mother    Brain cancer Mother      ROS:  Please see the history of present illness.   All other ROS reviewed and negative.     Physical Exam/Data:   Vitals:   03/04/21 2359 03/05/21 0100 03/05/21 0341 03/05/21 0734  BP:   114/77 100/68  Pulse:   74 71  Resp:   19   Temp:   97.7 F (36.5 C) 98.3 F (36.8 C)  TempSrc:   Oral Oral  SpO2:   92% 92%  Weight:  (!) 162.8 kg    Height: '6\' 3"'$  (1.905 m)       Intake/Output Summary (Last 24 hours) at 03/05/2021 0954 Last data filed at 03/05/2021 0500 Gross per 24 hour  Intake --  Output 400 ml  Net -400 ml   Last 3 Weights 03/05/2021 03/04/2021 03/04/2021  Weight (lbs) 358 lb 14.5 oz 361 lb 361 lb  Weight (kg) 162.8 kg 163.749 kg 163.749 kg     Body mass index is 44.86 kg/m.  General:  Obese WM, laying comfortably in bed HEENT: normal Neck: no JVD Vascular: No carotid bruits; Distal pulses 2+ bilaterally Cardiac:  normal S1, S2; RRR; no murmur  Lungs:  clear to  auscultation bilaterally, no wheezing, rhonchi or rales  Abd: soft, nontender, no hepatomegaly  Ext: no edema Musculoskeletal:  No deformities, BUE and BLE strength normal and equal Skin: warm and dry  Neuro:  CNs 2-12 intact, no focal abnormalities noted Psych:  Normal affect   EKG:  The EKG was personally reviewed and demonstrates:  SR AV-paced Telemetry:  Telemetry was personally reviewed and demonstrates:  V pacing, intermittent AV pacing  Relevant CV Studies:  Cath: 06/2020  Dist LAD lesion is 40% stenosed. Non-stenotic Mid LAD-2 lesion was previously treated. Non-stenotic Mid LAD-1 lesion was previously treated. Non-stenotic Mid Cx lesion was previously treated. Non-stenotic Prox Cx lesion was previously treated. Mid RCA lesion is 25% stenosed. Ost RPDA to RPDA lesion is 50% stenosed. Dist RCA lesion is 25% stenosed with 25% stenosed side branch in RPAV. LV end diastolic pressure is normal.   1. Nonobstructive CAD. Continued patency of stents in the LAD and LCx. No new changes compared to January 2020 2. Normal LVEDP   Plan: continue medical therapy.    Diagnostic Dominance: Right    Echo: 07/2020  IMPRESSIONS     1. Left ventricular ejection fraction, by estimation, is 30 to 35%. The  left ventricle has moderately decreased function. The left ventricle  demonstrates   2. severe global hypokinesis. Left ventricular diastolic parameters are  indeterminate.   3. Right ventricular systolic function was not well visualized. The right  ventricular size is not well visualized.  4. The mitral valve is normal in structure. No evidence of mitral valve  regurgitation. No evidence of mitral stenosis.   5. The aortic valve was not well visualized. Aortic valve regurgitation  is not visualized. Mild aortic valve stenosis by doppler. Aortic valve  area, by VTI measures 1.88 cm. Aortic valve mean gradient measures 9.0  mmHg. Aortic valve Vmax measures 2.17  m/s.   6. The  inferior vena cava is normal in size with greater than 50%  respiratory variability, suggesting right atrial pressure of 3 mmHg.   Comparison(s): EF 25-30%, severe hypokinesis.   FINDINGS   Left Ventricle: Left ventricular ejection fraction, by estimation, is 30  to 35%. The left ventricle has moderately decreased function. The left  ventricle demonstrates global hypokinesis. Definity contrast agent was  given IV to delineate the left  ventricular endocardial borders. The left ventricular internal cavity size  was normal in size. There is no left ventricular hypertrophy. Left  ventricular diastolic parameters are indeterminate.   Right Ventricle: The right ventricular size is not well visualized. Right  vetricular wall thickness was not well visualized. Right ventricular  systolic function was not well visualized.   Left Atrium: Left atrial size was normal in size.   Right Atrium: Right atrial size was normal in size.   Pericardium: There is no evidence of pericardial effusion. Presence of  pericardial fat pad.   Mitral Valve: The mitral valve is normal in structure. No evidence of  mitral valve regurgitation. No evidence of mitral valve stenosis.   Tricuspid Valve: The tricuspid valve is not assessed. Tricuspid valve  regurgitation is not demonstrated. No evidence of tricuspid stenosis.   Aortic Valve: The aortic valve was not well visualized. Aortic valve  regurgitation is not visualized. Mild aortic stenosis is present. Aortic  valve mean gradient measures 9.0 mmHg. Aortic valve peak gradient measures  18.8 mmHg. Aortic valve area, by VTI  measures 1.88 cm.   Pulmonic Valve: The pulmonic valve was not well visualized. Pulmonic valve  regurgitation is not visualized. No evidence of pulmonic stenosis.   Aorta: The aortic root was not well visualized.   Venous: The inferior vena cava is normal in size with greater than 50%  respiratory variability, suggesting right atrial  pressure of 3 mmHg.   IAS/Shunts: No atrial level shunt detected by color flow Doppler.   Additional Comments: A pacer wire is visualized.   Laboratory Data:  High Sensitivity Troponin:   Recent Labs  Lab 03/04/21 1705 03/04/21 1857  TROPONINIHS 4 4     Chemistry Recent Labs  Lab 03/04/21 1705  NA 136  K 3.9  CL 103  CO2 23  GLUCOSE 228*  BUN 17  CREATININE 1.29*  CALCIUM 8.9  GFRNONAA >60  ANIONGAP 10    No results for input(s): PROT, ALBUMIN, AST, ALT, ALKPHOS, BILITOT in the last 168 hours. Lipids No results for input(s): CHOL, TRIG, HDL, LABVLDL, LDLCALC, CHOLHDL in the last 168 hours.  Hematology Recent Labs  Lab 03/04/21 1705  WBC 6.4  RBC 4.63  HGB 14.7  HCT 41.8  MCV 90.3  MCH 31.7  MCHC 35.2  RDW 12.4  PLT 240   Thyroid No results for input(s): TSH, FREET4 in the last 168 hours.  BNPNo results for input(s): BNP, PROBNP in the last 168 hours.  DDimer No results for input(s): DDIMER in the last 168 hours.   Radiology/Studies:  DG Chest 2 View  Result Date: 03/04/2021 CLINICAL DATA:  Chest pain  EXAM: CHEST - 2 VIEW COMPARISON:  03/20/2019 FINDINGS: The heart size and mediastinal contours are within normal limits. Left chest multi lead pacer. Both lungs are clear. The visualized skeletal structures are unremarkable. IMPRESSION: No acute abnormality of the lungs. Electronically Signed   By: Eddie Candle M.D.   On: 03/04/2021 18:30     Assessment and Plan:   Joe [name] is a 50 y.o. male with a hx of systolic HF (XX123456), CAD DM, HTN, HL, OSA, multifocal PVCs who is being seen 03/05/2021 for the evaluation of chest pain/dyspnea at the request of Dr. Alcario Drought.  Chest pain/dyspnea: has had several episodes over the past couple of days. Similar to prior episodes of angina but less intense. Also increased dyspnea. EKG AV paced, hsTn negative x2. CXR negative. Cath 06/2020 with patent stents in the LAD/Lcx. Has been under significant stress at work as well.   -- episodes of palpitations, HR reported in the 140s in the office but not captures on EKG. Hx of PVCs. Have asked for device interrogation. He also has OSA but has not been using his Cpap.   Palpitations: intermittent episodes with associated dizziness. HR reported in the 140s in the office yesterday.  -- have asked for device check today   Chronic systolic CHF: does not appear volume overloaded on exam. Suspect OSA is contributing to symptoms as well. Has been compliant with home medications but blood pressures are low in the 123XX123 systolic at times. Suspect will need to back down on meds as BPs on the soft side presently -- would plan to resume coreg 3.125 BID with plan to add back other GDMT as tolerated  S/p BiV: St Jude, done by Dr. Curt Bears 02/2019 -- device interrogation requested   PVCs: hx of multifocal PVCs. As above, check device -- on mexiletine '200mg'$  BID  HLD: on Crestor '40mg'$  daily -- will resume  OSA: seen by Dr. Radford Pax outpatient. Had study done in sleep lab but was able to get a proper mask he liked. Has not been using.  -- needs to re-establish as outpatient  DM: Hgb A1c 7.8 -- SSI while inpatient -- farxiga PTA   Risk Assessment/Risk Scores:   HEAR Score (for undifferentiated chest pain):  HEAR Score: 4{   For questions or updates, please contact [place] Please consult [email or web address] for contact info under    Signed, Reino Bellis, NP  03/05/2021 9:54 AM    Attending note:  Patient seen and examined, I reviewed his records and discussed the case with Ms. Mancel Bale NP, I agree with her above findings.  Joe [name] is currently admitted to the hospital with recent complaints of worsening chest discomfort and also documentation of elevated heart rate in the 160s when he went to see Dr. Agustin Cree in the office yesterday.  Unfortunately, no arrhythmia was captured by ECG at that time.  Patient states that he was not aware that his heart rate was elevated.  He  states that he has been under a lot of stress at work, was in a stressful situation when he had chest discomfort on Thursday.  At baseline he reports mild recurring chest pain for several months.  He did undergo a cardiac catheterization in January of this year which demonstrated patent stent sites within the LAD and circumflex, no revascularization options.  He has been on solid medical therapy for ischemic cardiomyopathy as an outpatient, has also seen Dr. Haroldine Laws.  He reports compliance with therapy.  No major fluctuation in weight.  No sudden dizziness or syncope.  On examination this morning he appears comfortable.  Afebrile, systolic 123456, heart rate in the 70s with a ventricular paced rhythm by telemetry, also occasional PVCs.  Lungs are clear.  Cardiac exam with indistinct PMI, RRR and no gallop.  Trace ankle edema.  Pertinent lab work includes potassium 3.9, BUN 17, creatinine 1.29, hemoglobin 14.7, platelets 240, high-sensitivity troponin I levels normal, hemoglobin A1c 7.8%, SARS coronavirus 2 test negative.  Chest x-ray reports no acute findings.  I personally reviewed his ECG from September 16 showing sinus rhythm with ventricular pacing.  Patient has [employer]. Jude biventricular ICD in place followed by Dr. Curt Bears.  Device was interrogated today without any findings of VT or device shock.  Approximately 4% PVC burden.  Due to device parameters, no arrhythmias were otherwise captured (not set up to catch anything under 170 bpm).  At this point etiology of symptoms is not clear.  Certainly angina is a concern, although his cardiac enzymes do not suggest ACS and he has been on regular medical therapy which is well-rounded.  Also possibility of paroxysmal arrhythmia such as atrial fibrillation is to be considered, but nothing has been documented.  For now we will gradually reinstitute his baseline regimen, blood pressure has been low.  Also obtain follow-up echocardiogram and keep him on  telemetry for further observation.  Further testing can be considered as we move ahead.  Satira Sark, M.D., F.A.C.C.

## 2021-03-05 NOTE — H&P (Signed)
History and Physical    Joe [name] [id number] DOB: Nov 09, 1970 DOA: 03/04/2021  PCP: Cyndi Bender, PA-C  Patient coming from: Home  I have personally briefly reviewed patient's old medical records in [place]  Chief Complaint: CP  HPI: Joe [name] is a 50 y.o. male with medical history significant of ICM, chronic systolic CHF, CAD s/p stents, AICD placement, HTN, DM2, OSA.  Pt saw cardiologist Dr. Agustin Cree in office today with c/o ongoing CP including at rest, dyspnea severely worsened by exertion.  CP radiates to R arm and R upper chest.  Symptoms slowly worsening over months.  Had [place] in Jan for symptoms but no-occlusive dz seen, stents were patent.  CP feels like elephant sitting on chest.  No fevers, chills, cough.  Sent in to ED by Dr. Agustin Cree for admit to [facility].  ED Course: Reported to have an initial HR of 160, though this is resolved before they could get EKG or get him on monitor.  Didn't have interrogator down at med center.  Trop neg x2.  Pt transferred to [facility].  Asymptomatic currently.   Review of Systems: As per HPI, otherwise all review of systems negative.  Past Medical History:  Diagnosis Date   Abnormal EKG    Acute systolic heart failure ([place]) 09/06/2017   Angina pectoris ([place]) 09/05/2017   Body mass index 45.0-49.9, adult (HCC)    CAD S/P percutaneous coronary angioplasty 09/07/2017   mLAD and dLAD PCI with DES 09/06/17   CHF (congestive heart failure) (HCC)    Chronic systolic (congestive) heart failure ([place]) [id number]   Complication of anesthesia 1995   "had hard time waking up; breathing w/vasectomy"   Coronary artery disease    Erectile dysfunction    Essential hypertension, benign    Fatigue    GERD (gastroesophageal reflux disease)    Gout    High cholesterol    "just started tx yesterday" (09/06/2017)   History of gout    "RX prn" (09/06/2017)   Ischemic cardiomyopathy 09/07/2017   EF 25-35%   Muscular chest pain     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   Below Left Nare (MOH's)   Obesity    Obstructive sleep apnea    OSA on CPAP    Plantar fasciitis    Pre-operative clearance 02/11/2018   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/02/2019   [employer]   Right bundle branch block    Shortness of breath 09/05/2017   Testosterone deficiency    Type 2 diabetes mellitus with complication, without long-term current use of insulin ([place]) 09/05/2017   Type II diabetes mellitus ([place])    "started tx 08/28/2017"    Past Surgical History:  Procedure Laterality Date   BACK SURGERY     BIV ICD INSERTION CRT-D N/A 03/19/2019   Procedure: BIV ICD INSERTION CRT-D;  Surgeon: Constance Haw, MD;  Location: [place] [facility];  Service: Cardiovascular;  Laterality: N/A;   CORONARY ANGIOPLASTY WITH STENT PLACEMENT  09/06/2017   "3 stents"   CORONARY STENT INTERVENTION N/A 09/06/2017   Procedure: CORONARY STENT INTERVENTION;  Surgeon: Jettie Booze, MD;  Location: [place] [facility];  Service: Cardiovascular;  Laterality: N/A;   CORONARY STENT INTERVENTION N/A 02/15/2018   Procedure: CORONARY STENT INTERVENTION;  Surgeon: Jettie Booze, MD;  Location: [place] [facility];  Service: Cardiovascular;  Laterality: N/A;   KNEE ARTHROSCOPY Right    LEFT HEART CATH AND CORONARY ANGIOGRAPHY N/A 09/06/2017  Procedure: LEFT HEART CATH AND CORONARY ANGIOGRAPHY;  Surgeon: Jettie Booze, MD;  Location: [place] [facility];  Service: Cardiovascular;  Laterality: N/A;   LEFT HEART CATH AND CORONARY ANGIOGRAPHY N/A 02/15/2018   Procedure: LEFT HEART CATH AND CORONARY ANGIOGRAPHY;  Surgeon: Jettie Booze, MD;  Location: [place] [facility];  Service: Cardiovascular;  Laterality: N/A;   LEFT HEART CATH AND CORONARY ANGIOGRAPHY N/A 06/27/2018   Procedure: LEFT HEART CATH AND CORONARY ANGIOGRAPHY;  Surgeon: Jettie Booze, MD;  Location: [place] [facility];  Service:  Cardiovascular;  Laterality: N/A;   LEFT HEART CATH AND CORONARY ANGIOGRAPHY N/A 06/28/2020   Procedure: LEFT HEART CATH AND CORONARY ANGIOGRAPHY;  Surgeon: Martinique, Peter M, MD;  Location: [place] [facility];  Service: Cardiovascular;  Laterality: N/A;   LUMBAR SPINE SURGERY  ~ 2001   [place]     reports that he has never smoked. He has never used smokeless tobacco. He reports that he does not drink alcohol and does not use drugs.  No Known Allergies  Family History  Problem Relation Age of Onset   Lymphoma Father    Diabetes Mellitus II Brother    Diabetes Mellitus II Paternal Grandfather    Lung cancer Mother    Brain cancer Mother      Prior to Admission medications   Medication Sig Start Date End Date Taking? Authorizing Provider  acetaminophen (TYLENOL) 500 MG tablet Take 1,000 mg by mouth every 6 (six) hours as needed for moderate pain or headache.    [provider]  allopurinol (ZYLOPRIM) 100 MG tablet Take 100 mg by mouth daily as needed (for gout).     [provider]  aspirin EC 81 MG tablet Take 1 tablet (81 mg total) by mouth daily. 09/05/17   Park Liter, MD  carvedilol (COREG) 12.5 MG tablet Take 1 and 1/2 tablet twice daily. Patient taking differently: Take 37.5 mg by mouth 2 (two) times daily with a meal. Take 1 and 1/2 tablet twice daily. 05/05/20   Park Liter, MD  colchicine 0.6 MG tablet Take 0.6 mg by mouth daily as needed (Gout). 05/10/20   [provider]  dapagliflozin propanediol (FARXIGA) 10 MG TABS tablet Take 1 tablet (10 mg total) by mouth daily. Must keep further appointments for refills 01/10/21   Bensimhon, Shaune Pascal, MD  digoxin (LANOXIN) 0.125 MG tablet Take 1 tablet (0.125 mg total) by mouth daily. 02/04/20   Bensimhon, Shaune Pascal, MD  ezetimibe (ZETIA) 10 MG tablet TAKE 1 TABLET BY MOUTH EVERY DAY 01/31/21   Park Liter, MD  furosemide (LASIX) 40 MG tablet TAKE 1  TABLET BY MOUTH EVERY DAY Patient taking differently: Take 40 mg by mouth daily. 01/10/21   Park Liter, MD  furosemide (LASIX) 80 MG tablet Take 80 mg by mouth daily.    [provider]  halobetasol (ULTRAVATE) 0.05 % cream Apply 1 application topically at bedtime as needed (Eczema).    [provider]  ibuprofen (ADVIL) 200 MG tablet Take 800 mg by mouth every 8 (eight) hours as needed for headache or moderate pain.    [provider]  indomethacin (INDOCIN) 50 MG capsule Take 50 mg by mouth daily as needed for mild pain or moderate pain (Gout). 03/11/19   [provider]  isosorbide mononitrate (IMDUR) 60 MG 24 hr tablet Take 2 tablets (120 mg total) by mouth daily. 01/28/21 04/28/21  Agustin Cree,  Marily Lente, MD  metFORMIN (GLUCOPHAGE-XR) 500 MG 24 hr tablet Take 2 tablets (1,000 mg total) by mouth in the morning and at bedtime. 07/01/20   Martinique, Peter M, MD  mexiletine (MEXITIL) 200 MG capsule Take 1 capsule (200 mg total) by mouth 2 (two) times daily. Please make yearly appt with Dr. Curt Bears for April 2022 for future refills. Thank you 1st attempt 09/15/20   Constance Haw, MD  nitroGLYCERIN (NITROSTAT) 0.4 MG SL tablet Place 1 tablet (0.4 mg total) under the tongue every 5 (five) minutes as needed for chest pain. 02/14/21 05/15/21  Park Liter, MD  omeprazole (PRILOSEC) 40 MG capsule Take 1 capsule (40 mg total) by mouth daily. 01/14/19   Park Liter, MD  Polyethylene Glycol 400 (BLINK TEARS OP) Place 2 drops into both eyes 2 (two) times daily as needed (for dry eyes).    [provider]  potassium chloride SA (KLOR-CON) 20 MEQ tablet Take 20 mEq by mouth daily.    [provider]  ranolazine (RANEXA) 1000 MG SR tablet Take 1,000 mg by mouth 2 (two) times daily.    [provider]  rosuvastatin (CRESTOR) 40 MG tablet Take 40 mg by mouth every evening.    [provider]  sacubitril-valsartan (ENTRESTO)  97-103 MG Take 1 tablet by mouth 2 (two) times daily.    [provider]  spironolactone (ALDACTONE) 25 MG tablet Take 12.5 mg by mouth daily. Take 1/2 daily    [provider]    Physical Exam: Vitals:   03/04/21 1953 03/04/21 2300 03/04/21 2356 03/04/21 2359  BP: 104/68 (!) 104/55 116/75   Pulse: 65 62 72   Resp: '16 11 18   '$ Temp:   97.7 F (36.5 C)   TempSrc:   Oral   SpO2: 94% 95% 95%   Weight:      Height:    (P) '6\' 3"'$  (1.905 m)    Constitutional: NAD, calm, comfortable Eyes: PERRL, lids and conjunctivae normal ENMT: Mucous membranes are moist. Posterior pharynx clear of any exudate or lesions.Normal dentition.  Neck: normal, supple, no masses, no thyromegaly Respiratory: clear to auscultation bilaterally, no wheezing, no crackles. Normal respiratory effort. No accessory muscle use.  Cardiovascular: Regular rate and rhythm, no murmurs / rubs / gallops. No extremity edema. 2+ pedal pulses. No carotid bruits.  Abdomen: no tenderness, no masses palpated. No hepatosplenomegaly. Bowel sounds positive.  Musculoskeletal: no clubbing / cyanosis. No joint deformity upper and lower extremities. Good ROM, no contractures. Normal muscle tone.  Skin: no rashes, lesions, ulcers. No induration Neurologic: CN 2-12 grossly intact. Sensation intact, DTR normal. Strength 5/5 in all 4.  Psychiatric: Normal judgment and insight. Alert and oriented x 3. Normal mood.    Labs on Admission: I have personally reviewed following labs and imaging studies  CBC: Recent Labs  Lab 03/04/21 1705  WBC 6.4  HGB 14.7  HCT 41.8  MCV 90.3  PLT [id number]   Basic Metabolic Panel: Recent Labs  Lab 03/04/21 1705  NA 136  K 3.9  CL 103  CO2 23  GLUCOSE 228*  BUN 17  CREATININE 1.29*  CALCIUM 8.9   GFR: Estimated Creatinine Clearance: 112.6 mL/min (A) (by C-G formula based on SCr of 1.29 mg/dL (H)). Liver Function Tests: No results for input(s): AST, ALT, ALKPHOS, BILITOT, PROT,  ALBUMIN in the last 168 hours. No results for input(s): LIPASE, AMYLASE in the last 168 hours. No results for input(s): AMMONIA in the last  168 hours. Coagulation Profile: No results for input(s): INR, PROTIME in the last 168 hours. Cardiac Enzymes: No results for input(s): CKTOTAL, CKMB, CKMBINDEX, TROPONINI in the last 168 hours. BNP (last 3 results) Recent Labs    11/04/20 1130 01/28/21 0858  PROBNP 76 26   HbA1C: No results for input(s): HGBA1C in the last 72 hours. CBG: Recent Labs  Lab 03/05/21 0104  GLUCAP 187*   Lipid Profile: No results for input(s): CHOL, HDL, LDLCALC, TRIG, CHOLHDL, LDLDIRECT in the last 72 hours. Thyroid Function Tests: No results for input(s): TSH, T4TOTAL, FREET4, T3FREE, THYROIDAB in the last 72 hours. Anemia Panel: No results for input(s): VITAMINB12, FOLATE, FERRITIN, TIBC, IRON, RETICCTPCT in the last 72 hours. Urine analysis: No results found for: COLORURINE, APPEARANCEUR, [place], [place], GLUCOSEU, [place], BILIRUBINUR, KETONESUR, PROTEINUR, [place], NITRITE, LEUKOCYTESUR  Radiological Exams on Admission: DG Chest 2 View  Result Date: 03/04/2021 CLINICAL DATA:  Chest pain EXAM: CHEST - 2 VIEW COMPARISON:  03/20/2019 FINDINGS: The heart size and mediastinal contours are within normal limits. Left chest multi lead pacer. Both lungs are clear. The visualized skeletal structures are unremarkable. IMPRESSION: No acute abnormality of the lungs. Electronically Signed   By: Eddie Candle M.D.   On: 03/04/2021 18:30    EKG: Independently reviewed.  Assessment/Plan Principal Problem:   Chest pain Active Problems:   Essential hypertension, benign   Type 2 diabetes mellitus with complication, without long-term current use of insulin (HCC)   CAD S/P percutaneous coronary angioplasty   Chronic systolic (congestive) heart failure (HCC)   OSA on CPAP    CP - Ischemic?  LHC in Jan was really unimpressive though. Actually with the reported HR  160 on presentation, wondering if hes having runs of a.fib or some other tachy arrhythmia responsible for his CP. Attempted to interrogate PPM, no fax back yet, calling [employer] [facility] to see if they got the interrogation. CP obs pathway Tele monitor NPO Message sent to Cards for AM consult DM2 - Hold home PO meds Sensitive SSI Q4H OSA - cont CPAP Chronic systolic CHF - Cont home meds HTN - Cont home meds  DVT prophylaxis: Lovenox Code Status: Full Family Communication: No family in room Disposition Plan: Home after CP work up by cards Consults called: Message sent to P. Abagail Kitchens for AM cards eval Admission status: Place in ob     [place], [place] Hospitalists  How to contact the [facility] Attending or Consulting provider [place] or covering provider during after hours [place], for this patient?  Check the care team in [facility] and look for a) attending/consulting [facility] provider listed and b) the [facility] team listed Log into [email or web address]  Amion Physician Scheduling and messaging for groups and whole hospitals  On call and physician scheduling software for group practices, residents, hospitalists and other medical providers for call, clinic, rotation and shift schedules. On[employer] is a hospital-wide system for scheduling doctors and paging doctors on call. EasyPlot is for scientific plotting and data analysis.  [email or web address]  and use [place]'s universal password to access. If you do not have the password, please contact the hospital operator.  Locate the [facility] provider you are looking for under [facility] and page to a number that you can be directly reached. If you still have difficulty reaching the provider, please page the [facility] (Director on Call) for the Hospitalists listed on amion for assistance.  03/05/2021, 1:18 AM

## 2021-03-05 NOTE — Progress Notes (Signed)
PROGRESS NOTE    Joe [name]  [id number] DOB: [birth year] DOA: 03/04/2021 PCP: Cyndi Bender, PA-C   Brief Narrative:   Joe [name] is a 50 yo male with PMH  ICM, chronic systolic CHF, CAD s/p stents, AICD placement, HTN, DM2, OSA, has been followed by Dr. Agustin Cree as an outpatient and intermittently in the  AHF clinic. Underwent cardiac cath in 08/2017 with severe mLAD disease treated with PCI/DESx3, dRCA 50%, mLCx 50% treated medically. Myoview in 05/2018 with EF of 30% apical and infero-apical scar.   Echo 12/2018 with EF of 20-25%, moderate RV dysfunction.    Cardiac cath several months 01/2018 with patent stents in the LAD with new 75% mLcx and 80% pLcx treated with DES x2. Continued on DAPT with ASA/Brilinta. Repeat cath in 06/2018 with all stents being patent, no intervention.    Underwent ICD placement in 02/2019 with Dr. Curt Bears [facility]) and followed as an outpatient.    Seen in the AHF with Dr. Haroldine Laws for titration of HF therapy medications. Question raised over mixed HF etiology with CAD as well as OSA/DM. He was continued on coreg, entresto, spiro, lasix with farxiga added. Ordered for a home sleep study.   Seen by Dr. Curt Bears for multifocal PVCs, high burden. This was reducing his BiV placing Placed on mexiletine    Evaluated by Dr. Radford Pax for OSA 07/2019 with recommendation for formal sleep study in the sleep lab to be fitted for full face mask.    Most recent cardiac cath 06/2020 for atypical chest pain with patent stents in the Lcx and LAD. No new targets. Echo 30-35% with severe global hypokinesis.    Was seen on the office 9/16 by Dr. Agustin Cree with complaints of chest pain and palpitations. Noted indicate his HR was elevated in the 140s. EKG done in the office noted NSR, atrial sensed, v paced. Reported palpitations and dizziness as well. Last interrogation from 8/1 with no arrhythmias. Given his on-going chest pain in the office it was advised that he present to the ED  for further evaluation.    In the ED his labs showed stable electrolytes, Cr 1.29, hsTn 4>>4, WBC 6.4, Hgb 14.7. EKG AV-paced, RBBB. CXR negative. Admitted to [facility] for further management. Cardiology consulted.  Assessment & Plan:   Principal Problem:   Chest pain Active Problems:   Essential hypertension, benign   Type 2 diabetes mellitus with complication, without long-term current use of insulin (HCC)   CAD S/P percutaneous coronary angioplasty   Chronic systolic (congestive) heart failure (HCC)   OSA on CPAP  Chest pain: Currently chest pain-free.  Enzymes negative.  EKG without any significant ischemic changes.  Cardiology on board.  Device interrogation with no VT/VF no shocks, 4% PVC burden.  No A. fib.  Cardiology plans to monitor overnight and reassess tomorrow and possible consider further ischemic work-up.  Type 2 diabetes mellitus: Continue SSI.  OSA: Continue CPAP.  Chronic systolic CHF: Appears euvolemic.  On multiple medications at home but currently cardiology has planned to resume only Coreg but hold other medications.  Hypertension: Controlled.  Continue Coreg.  DVT prophylaxis: enoxaparin (LOVENOX) injection 40 mg Start: 03/05/21 0045   Code Status: Full Code  Family Communication: Wife present at bedside.  Plan of care discussed with patient in length and he verbalized understanding and agreed with it.  Status is: Observation  The patient will require care spanning > 2 midnights and should be moved to inpatient because: Ongoing diagnostic testing needed not appropriate  for outpatient work up  Dispo: The patient is from: Home              Anticipated d/c is to: Home              Patient currently is not medically stable to d/c.   Difficult to place patient No        Estimated body mass index is 44.86 kg/m as calculated from the following:   Height as of this encounter: '6\' 3"'$  (1.905 m).   Weight as of this encounter: 162.8 kg.     Nutritional  Assessment: Body mass index is 44.86 kg/m.Marland Kitchen Seen by dietician.  I agree with the assessment and plan as outlined below: Nutrition Status:        .  Skin Assessment: I have examined the patient's skin and I agree with the wound assessment as performed by the wound care RN as outlined below:    Consultants:  Cardiology  Procedures:  None  Antimicrobials:  Anti-infectives (From admission, onward)    None          Subjective: Seen and examined.  Wife at the bedside.  He has no more chest pain.  No other complaint.  Objective: Vitals:   03/05/21 0100 03/05/21 0341 03/05/21 0734 03/05/21 1149  BP:  114/77 100/68 106/66  Pulse:  74 71 81  Resp:  19  17  Temp:  97.7 F (36.5 C) 98.3 F (36.8 C) 98.3 F (36.8 C)  TempSrc:  Oral Oral Oral  SpO2:  92% 92% 96%  Weight: (!) 162.8 kg     Height:        Intake/Output Summary (Last 24 hours) at 03/05/2021 1450 Last data filed at 03/05/2021 1251 Gross per 24 hour  Intake 180 ml  Output 400 ml  Net -220 ml   Filed Weights   03/04/21 1703 03/05/21 0100  Weight: (!) 163.7 kg (!) 162.8 kg    Examination:  General exam: Appears calm and comfortable, morbidly obese Respiratory system: Clear to auscultation. Respiratory effort normal. Cardiovascular system: S1 & S2 heard, RRR. No JVD, murmurs, rubs, gallops or clicks. No pedal edema. Gastrointestinal system: Abdomen is nondistended, soft and nontender. No organomegaly or masses felt. Normal bowel sounds heard. Central nervous system: Alert and oriented. No focal neurological deficits. Extremities: Symmetric 5 x 5 power. Skin: No rashes, lesions or ulcers Psychiatry: Judgement and insight appear normal. Mood & affect appropriate.    Data Reviewed: I have personally reviewed following labs and imaging studies  CBC: Recent Labs  Lab 03/04/21 1705  WBC 6.4  HGB 14.7  HCT 41.8  MCV 90.3  PLT [id number]   Basic Metabolic Panel: Recent Labs  Lab 03/04/21 1705  NA 136   K 3.9  CL 103  CO2 23  GLUCOSE 228*  BUN 17  CREATININE 1.29*  CALCIUM 8.9   GFR: Estimated Creatinine Clearance: 112.2 mL/min (A) (by C-G formula based on SCr of 1.29 mg/dL (H)). Liver Function Tests: No results for input(s): AST, ALT, ALKPHOS, BILITOT, PROT, ALBUMIN in the last 168 hours. No results for input(s): LIPASE, AMYLASE in the last 168 hours. No results for input(s): AMMONIA in the last 168 hours. Coagulation Profile: No results for input(s): INR, PROTIME in the last 168 hours. Cardiac Enzymes: No results for input(s): CKTOTAL, CKMB, CKMBINDEX, TROPONINI in the last 168 hours. BNP (last 3 results) Recent Labs    11/04/20 1130 01/28/21 0858  PROBNP 76 26   HbA1C: Recent Labs  03/05/21 0334  HGBA1C 7.8*   CBG: Recent Labs  Lab 03/05/21 0104 03/05/21 0459 03/05/21 0753 03/05/21 1153  GLUCAP 187* 142* 150* 197*   Lipid Profile: No results for input(s): CHOL, HDL, LDLCALC, TRIG, CHOLHDL, LDLDIRECT in the last 72 hours. Thyroid Function Tests: No results for input(s): TSH, T4TOTAL, FREET4, T3FREE, THYROIDAB in the last 72 hours. Anemia Panel: No results for input(s): VITAMINB12, FOLATE, FERRITIN, TIBC, IRON, RETICCTPCT in the last 72 hours. Sepsis Labs: No results for input(s): PROCALCITON, LATICACIDVEN in the last 168 hours.  Recent Results (from the past 240 hour(s))  Resp Panel by RT-PCR (Flu A&B, Covid) Nasopharyngeal Swab     Status: None   Collection Time: 03/04/21  9:14 PM   Specimen: Nasopharyngeal Swab; Nasopharyngeal(NP) swabs in vial transport medium  Result Value Ref Range Status   SARS Coronavirus 2 by RT PCR NEGATIVE NEGATIVE Final    Comment: (NOTE) SARS-CoV-2 target nucleic acids are NOT DETECTED.  The SARS-CoV-2 RNA is generally detectable in upper respiratory specimens during the acute phase of infection. The lowest concentration of SARS-CoV-2 viral copies this assay can detect is 138 copies/mL. A negative result does not  preclude SARS-Cov-2 infection and should not be used as the sole basis for treatment or other patient management decisions. A negative result may occur with  improper specimen collection/handling, submission of specimen other than nasopharyngeal swab, presence of viral mutation(s) within the areas targeted by this assay, and inadequate number of viral copies(<138 copies/mL). A negative result must be combined with clinical observations, patient history, and epidemiological information. The expected result is Negative.  Fact Sheet for Patients:  EntrepreneurPulse.com.au  Fact Sheet for Healthcare Providers:  IncredibleEmployment.be  This test is no t yet approved or cleared by the [place] FDA and  has been authorized for detection and/or diagnosis of SARS-CoV-2 by FDA under an Emergency Use Authorization (EUA). This EUA will remain  in effect (meaning this test can be used) for the duration of the COVID-19 declaration under Section 564(b)(1) of the Act, 21 U.S.C.section 360bbb-3(b)(1), unless the authorization is terminated  or revoked sooner.       Influenza A by PCR NEGATIVE NEGATIVE Final   Influenza B by PCR NEGATIVE NEGATIVE Final    Comment: (NOTE) The Xpert Xpress SARS-CoV-2/FLU/RSV plus assay is intended as an aid in the diagnosis of influenza from Nasopharyngeal swab specimens and should not be used as a sole basis for treatment. Nasal washings and aspirates are unacceptable for Xpert Xpress SARS-CoV-2/FLU/RSV testing.  Fact Sheet for Patients: EntrepreneurPulse.com.au  Fact Sheet for Healthcare Providers: IncredibleEmployment.be  This test is not yet approved or cleared by the [place] FDA and has been authorized for detection and/or diagnosis of SARS-CoV-2 by FDA under an Emergency Use Authorization (EUA). This EUA will remain in effect (meaning this test can be used) for the  duration of the COVID-19 declaration under Section 564(b)(1) of the Act, 21 U.S.C. section 360bbb-3(b)(1), unless the authorization is terminated or revoked.  Performed at [facility], [address] [place], [place] [address]       Radiology Studies: DG Chest 2 View  Result Date: 03/04/2021 CLINICAL DATA:  Chest pain EXAM: CHEST - 2 VIEW COMPARISON:  03/20/2019 FINDINGS: The heart size and mediastinal contours are within normal limits. Left chest multi lead pacer. Both lungs are clear. The visualized skeletal structures are unremarkable. IMPRESSION: No acute abnormality of the lungs. Electronically Signed   By: Dorna Bloom.D.  On: 03/04/2021 18:30    Scheduled Meds:  aspirin EC  81 mg Oral Daily   carvedilol  6.25 mg Oral BID WC   enoxaparin (LOVENOX) injection  40 mg Subcutaneous QHS   ezetimibe  10 mg Oral Daily   insulin aspart  0-9 Units Subcutaneous Q4H   mexiletine  200 mg Oral BID   rosuvastatin  40 mg Oral Daily   Continuous Infusions:   LOS: 0 days   Time spent: 35 minutes   Darliss Cheney, MD [facility]  03/05/2021, 2:50 PM  Please page via [place] and do not message via secure chat for anything urgent. Secure chat can be used for anything non urgent.  How to contact the [facility] Attending or Consulting provider [place] or covering provider during after hours [place], for this patient?  Check the care team in [facility] and look for a) attending/consulting [facility] provider listed and b) the [facility] team listed. Page or secure chat [phone]. Log into [email or web address] and use [place]'s universal password to access. If you do not have the password, please contact the hospital operator. Locate the [facility] provider you are looking for under [facility] and page to a number that you can be directly reached. If you still have difficulty reaching the provider, please page the [facility] (Director on Call) for the Hospitalists listed on amion for assistance.

## 2021-03-06 ENCOUNTER — Observation Stay (HOSPITAL_BASED_OUTPATIENT_CLINIC_OR_DEPARTMENT_OTHER): Payer: BC Managed Care – PPO

## 2021-03-06 ENCOUNTER — Encounter (HOSPITAL_COMMUNITY): Payer: Self-pay | Admitting: Family Medicine

## 2021-03-06 DIAGNOSIS — R079 Chest pain, unspecified: Secondary | ICD-10-CM | POA: Diagnosis not present

## 2021-03-06 DIAGNOSIS — I200 Unstable angina: Secondary | ICD-10-CM | POA: Diagnosis not present

## 2021-03-06 LAB — GLUCOSE, CAPILLARY
Glucose-Capillary: 181 mg/dL — ABNORMAL HIGH (ref 70–99)
Glucose-Capillary: 167 mg/dL — ABNORMAL HIGH (ref 70–99)
Glucose-Capillary: 180 mg/dL — ABNORMAL HIGH (ref 70–99)
Glucose-Capillary: 169 mg/dL — ABNORMAL HIGH (ref 70–99)
Glucose-Capillary: 206 mg/dL — ABNORMAL HIGH (ref 70–99)

## 2021-03-06 LAB — ECHOCARDIOGRAM COMPLETE
Area-P 1/2: 3.07 cm2
Height: 75 in
S' Lateral: 4.5 cm
Weight: 5710.4 oz

## 2021-03-06 MED ORDER — PERFLUTREN LIPID MICROSPHERE
1.0000 mL | INTRAVENOUS | Status: AC | PRN
  Administered 2021-03-06: 2 mL via INTRAVENOUS
  Filled 2021-03-06: qty 10

## 2021-03-06 MED ORDER — ASPIRIN 81 MG PO CHEW
81.0000 mg | CHEWABLE_TABLET | ORAL | Status: AC
  Administered 2021-03-07: 81 mg via ORAL
  Filled 2021-03-06: qty 1

## 2021-03-06 MED ORDER — SODIUM CHLORIDE 0.9 % IV SOLN: 250.0000 mL | INTRAVENOUS | Status: DC | PRN

## 2021-03-06 MED ORDER — SODIUM CHLORIDE 0.9 % WEIGHT BASED INFUSION
1.0000 mL/kg/h | INTRAVENOUS | Status: DC
  Administered 2021-03-07 (×2): 1 mL/kg/h via INTRAVENOUS

## 2021-03-06 MED ORDER — SODIUM CHLORIDE 0.9% FLUSH
3.0000 mL | Freq: Two times a day (BID) | INTRAVENOUS | Status: DC
  Administered 2021-03-06 – 2021-03-08 (×5): 3 mL via INTRAVENOUS

## 2021-03-06 MED ORDER — SODIUM CHLORIDE 0.9 % WEIGHT BASED INFUSION
3.0000 mL/kg/h | INTRAVENOUS | Status: AC
  Administered 2021-03-07: 3 mL/kg/h via INTRAVENOUS

## 2021-03-06 MED ORDER — SODIUM CHLORIDE 0.9% FLUSH: 3.0000 mL | INTRAVENOUS | Status: DC | PRN

## 2021-03-06 MED ORDER — ASPIRIN EC 81 MG PO TBEC
81.0000 mg | DELAYED_RELEASE_TABLET | Freq: Every day | ORAL | Status: DC
  Administered 2021-03-08: 81 mg via ORAL
  Filled 2021-03-06: qty 1

## 2021-03-06 NOTE — Progress Notes (Signed)
*  PRELIMINARY RESULTS* Echocardiogram 2D Echocardiogram has been performed with definity.  Leavy Cella 03/06/2021, 12:26 PM

## 2021-03-06 NOTE — Plan of Care (Signed)

## 2021-03-06 NOTE — Progress Notes (Signed)
Progress Note  Patient Name: Joe [name] Date of Encounter: 03/06/2021  Primary Cardiologist: Jenne Campus, MD  Subjective   No chest pain or palpitations, no breathlessness at rest.  Inpatient Medications    Scheduled Meds:  aspirin EC  81 mg Oral Daily   carvedilol  6.25 mg Oral BID WC   enoxaparin (LOVENOX) injection  40 mg Subcutaneous QHS   ezetimibe  10 mg Oral Daily   insulin aspart  0-9 Units Subcutaneous Q4H   mexiletine  200 mg Oral BID   rosuvastatin  40 mg Oral Daily    PRN Meds: acetaminophen, ondansetron (ZOFRAN) IV   Vital Signs    Vitals:   03/05/21 1959 03/05/21 2245 03/06/21 0026 03/06/21 0434  BP: 115/69  116/76 118/76  Pulse: 70 71 65 67  Resp: '16 17 16 16  '$ Temp: 98.8 F (37.1 C)  98.1 F (36.7 C) (!) 97.5 F (36.4 C)  TempSrc: Oral  Oral Oral  SpO2: 95% 94% 96% 93%  Weight:   (!) 161.9 kg   Height:        Intake/Output Summary (Last 24 hours) at 03/06/2021 1012 Last data filed at 03/06/2021 0856 Gross per 24 hour  Intake 300 ml  Output 1750 ml  Net -1450 ml   Filed Weights   03/04/21 1703 03/05/21 0100 03/06/21 0026  Weight: (!) 163.7 kg (!) 162.8 kg (!) 161.9 kg    Telemetry    Paced rhythm.  Personally reviewed.  ECG    No ECG reviewed.  Physical Exam   GEN: No acute distress.   Neck: No JVD. Cardiac: RRR, no gallop.  Respiratory: Nonlabored. Clear to auscultation bilaterally. GI: Soft, nontender, bowel sounds present. MS: Trace ankle edema; No deformity. Neuro:  Nonfocal. Psych: Alert and oriented x 3. Normal affect.  Labs    Chemistry Recent Labs  Lab 03/04/21 1705  NA 136  K 3.9  CL 103  CO2 23  GLUCOSE 228*  BUN 17  CREATININE 1.29*  CALCIUM 8.9  GFRNONAA >60  ANIONGAP 10     Hematology Recent Labs  Lab 03/04/21 1705  WBC 6.4  RBC 4.63  HGB 14.7  HCT 41.8  MCV 90.3  MCH 31.7  MCHC 35.2  RDW 12.4  PLT 240    Cardiac Enzymes Recent Labs  Lab 03/04/21 1705 03/04/21 1857   TROPONINIHS 4 4    Radiology    DG Chest 2 View  Result Date: 03/04/2021 CLINICAL DATA:  Chest pain EXAM: CHEST - 2 VIEW COMPARISON:  03/20/2019 FINDINGS: The heart size and mediastinal contours are within normal limits. Left chest multi lead pacer. Both lungs are clear. The visualized skeletal structures are unremarkable. IMPRESSION: No acute abnormality of the lungs. Electronically Signed   By: Eddie Candle M.D.   On: 03/04/2021 18:30    Cardiac Studies   Follow-up echocardiogram pending.  Assessment & Plan    1.  Patient presenting with recurring chest discomfort and shortness of breath, worst in the last few days prior to hospitalization.  Also reportedly elevated heart rate intermittently, but so far no specific arrhythmias have been documented.  He reports compliance with medical therapy as an outpatient, relatively low blood pressure at this time limiting standard therapy however.  He has a known ischemic cardiomyopathy.  Cardiac catheterization in January revealed patent stent sites within the LAD and circumflex.  Concern is for unstable angina at this point, cardiac enzymes do not suggest ACS.  2.  History of frequent  PVCs, approximately 4% by device interrogation yesterday.  He has had no VT or device shocks with [facility] biventricular ICD in place.  Currently on mexiletine with followed by Dr. Curt Bears.  3.  Reported heart rate in the 140-160 range as an outpatient, assessed by primary cardiologist although rhythm not captured by ECG.  Nothing detected on ICD interrogation due to device detection parameters.  No arrhythmias by telemetry so far.  4.  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  Plan is for right and left heart catheterization tomorrow for reevaluation of coronary anatomy and assessment of hemodynamics.  Recent blood pressure limits resumption of previously stable cardiac regimen.  Currently on  aspirin, Coreg at lower dose, Crestor, Zetia, and mexiletine.  At this point Ranexa, Aldactone, Entresto, Imdur, and Wilder Glade have remained on hold.  Follow-up on repeat echocardiogram with Definity contrast.  Signed, Rozann Lesches, MD  03/06/2021, 10:12 AM

## 2021-03-06 NOTE — Progress Notes (Signed)
PROGRESS NOTE    Joe [name]  [id number] DOB: 1971-02-12 DOA: 03/04/2021 PCP: Cyndi Bender, PA-C   Brief Narrative:   Joe [name] is a 50 yo male with PMH  ICM, chronic systolic CHF, CAD s/p stents, AICD placement, HTN, DM2, OSA, has been followed by Dr. Agustin Cree as an outpatient and intermittently in the  AHF clinic. Underwent cardiac cath in 08/2017 with severe mLAD disease treated with PCI/DESx3, dRCA 50%, mLCx 50% treated medically. Myoview in 05/2018 with EF of 30% apical and infero-apical scar.   Echo 12/2018 with EF of 20-25%, moderate RV dysfunction.    Cardiac cath several months 01/2018 with patent stents in the LAD with new 75% mLcx and 80% pLcx treated with DES x2. Continued on DAPT with ASA/Brilinta. Repeat cath in 06/2018 with all stents being patent, no intervention.    Underwent ICD placement in 02/2019 with Dr. Curt Bears [facility] Jude) and followed as an outpatient.    Seen in the AHF with Dr. Haroldine Laws for titration of HF therapy medications. Question raised over mixed HF etiology with CAD as well as OSA/DM. He was continued on coreg, entresto, spiro, lasix with farxiga added. Ordered for a home sleep study.   Seen by Dr. Curt Bears for multifocal PVCs, high burden. This was reducing his BiV placing Placed on mexiletine    Evaluated by Dr. Radford Pax for OSA 07/2019 with recommendation for formal sleep study in the sleep lab to be fitted for full face mask.    Most recent cardiac cath 06/2020 for atypical chest pain with patent stents in the Lcx and LAD. No new targets. Echo 30-35% with severe global hypokinesis.    Was seen on the office 9/16 by Dr. Agustin Cree with complaints of chest pain and palpitations. Noted indicate his HR was elevated in the 140s. EKG done in the office noted NSR, atrial sensed, v paced. Reported palpitations and dizziness as well. Last interrogation from 8/1 with no arrhythmias. Given his on-going chest pain in the office it was advised that he present to the ED  for further evaluation.    In the ED his labs showed stable electrolytes, Cr 1.29, hsTn 4>>4, WBC 6.4, Hgb 14.7. EKG AV-paced, RBBB. CXR negative. Admitted to [facility] for further management. Cardiology consulted.  Assessment & Plan:   Principal Problem:   Chest pain Active Problems:   Essential hypertension, benign   Type 2 diabetes mellitus with complication, without long-term current use of insulin (HCC)   CAD S/P percutaneous coronary angioplasty   Chronic systolic (congestive) heart failure (HCC)   OSA on CPAP  Chest pain: Currently chest pain-free.  Enzymes negative.  EKG without any significant ischemic changes.  Cardiology on board.  Device interrogation with no VT/VF no shocks, 4% PVC burden.  No A. fib.  Cardiology plans for right and left heart cath tomorrow.  Currently on aspirin, Coreg at lower dose, Crestor, Zetia and mexiletine.  Ranexa, Aldactone and Entresto Imdur and Farxiga on hold.  Management per cardiology.  Type 2 diabetes mellitus: Continue SSI.  OSA: Continue CPAP.  Chronic systolic CHF: Appears euvolemic.  Management per cardiology.  Hypertension: Controlled.  Continue current regimen  DVT prophylaxis: enoxaparin (LOVENOX) injection 40 mg Start: 03/05/21 0045   Code Status: Full Code  Family Communication: Wife present at bedside.  Plan of care discussed with patient in length and he verbalized understanding and agreed with it.  Status is: Observation  The patient will require care spanning > 2 midnights and should be moved to inpatient  because: Ongoing diagnostic testing needed not appropriate for outpatient work up  Dispo: The patient is from: Home              Anticipated d/c is to: Home              Patient currently is not medically stable to d/c.   Difficult to place patient No        Estimated body mass index is 44.61 kg/m as calculated from the following:   Height as of this encounter: '6\' 3"'$  (1.905 m).   Weight as of this encounter: 161.9  kg.     Nutritional Assessment: Body mass index is 44.61 kg/m.Marland Kitchen Seen by dietician.  I agree with the assessment and plan as outlined below: Nutrition Status:        .  Skin Assessment: I have examined the patient's skin and I agree with the wound assessment as performed by the wound care RN as outlined below:    Consultants:  Cardiology  Procedures:  None  Antimicrobials:  Anti-infectives (From admission, onward)    None          Subjective: Patient seen and examined.  No complaints.  No chest pain or shortness of breath.  Objective: Vitals:   03/05/21 1959 03/05/21 2245 03/06/21 0026 03/06/21 0434  BP: 115/69  116/76 118/76  Pulse: 70 71 65 67  Resp: '16 17 16 16  '$ Temp: 98.8 F (37.1 C)  98.1 F (36.7 C) (!) 97.5 F (36.4 C)  TempSrc: Oral  Oral Oral  SpO2: 95% 94% 96% 93%  Weight:   (!) 161.9 kg   Height:        Intake/Output Summary (Last 24 hours) at 03/06/2021 1105 Last data filed at 03/06/2021 0856 Gross per 24 hour  Intake 300 ml  Output 1750 ml  Net -1450 ml    Filed Weights   03/04/21 1703 03/05/21 0100 03/06/21 0026  Weight: (!) 163.7 kg (!) 162.8 kg (!) 161.9 kg    Examination:  General exam: Appears calm and comfortable, morbidly obese Respiratory system: Clear to auscultation. Respiratory effort normal. Cardiovascular system: S1 & S2 heard, RRR. No JVD, murmurs, rubs, gallops or clicks. No pedal edema. Gastrointestinal system: Abdomen is nondistended, soft and nontender. No organomegaly or masses felt. Normal bowel sounds heard. Central nervous system: Alert and oriented. No focal neurological deficits. Extremities: Symmetric 5 x 5 power. Skin: No rashes, lesions or ulcers.  Psychiatry: Judgement and insight appear normal. Mood & affect appropriate.    Data Reviewed: I have personally reviewed following labs and imaging studies  CBC: Recent Labs  Lab 03/04/21 1705  WBC 6.4  HGB 14.7  HCT 41.8  MCV 90.3  PLT 240     Basic Metabolic Panel: Recent Labs  Lab 03/04/21 1705  NA 136  K 3.9  CL 103  CO2 23  GLUCOSE 228*  BUN 17  CREATININE 1.29*  CALCIUM 8.9    GFR: Estimated Creatinine Clearance: 111.9 mL/min (A) (by C-G formula based on SCr of 1.29 mg/dL (H)). Liver Function Tests: No results for input(s): AST, ALT, ALKPHOS, BILITOT, PROT, ALBUMIN in the last 168 hours. No results for input(s): LIPASE, AMYLASE in the last 168 hours. No results for input(s): AMMONIA in the last 168 hours. Coagulation Profile: No results for input(s): INR, PROTIME in the last 168 hours. Cardiac Enzymes: No results for input(s): CKTOTAL, CKMB, CKMBINDEX, TROPONINI in the last 168 hours. BNP (last 3 results) Recent Labs  11/04/20 1130 01/28/21 0858  PROBNP 76 26    HbA1C: Recent Labs    03/05/21 0334  HGBA1C 7.8*    CBG: Recent Labs  Lab 03/05/21 1707 03/05/21 1956 03/05/21 2352 03/06/21 0439 03/06/21 0720  GLUCAP 165* 198* 148* 181* 167*    Lipid Profile: No results for input(s): CHOL, HDL, LDLCALC, TRIG, CHOLHDL, LDLDIRECT in the last 72 hours. Thyroid Function Tests: No results for input(s): TSH, T4TOTAL, FREET4, T3FREE, THYROIDAB in the last 72 hours. Anemia Panel: No results for input(s): VITAMINB12, FOLATE, FERRITIN, TIBC, IRON, RETICCTPCT in the last 72 hours. Sepsis Labs: No results for input(s): PROCALCITON, LATICACIDVEN in the last 168 hours.  Recent Results (from the past 240 hour(s))  Resp Panel by RT-PCR (Flu A&B, Covid) Nasopharyngeal Swab     Status: None   Collection Time: 03/04/21  9:14 PM   Specimen: Nasopharyngeal Swab; Nasopharyngeal(NP) swabs in vial transport medium  Result Value Ref Range Status   SARS Coronavirus 2 by RT PCR NEGATIVE NEGATIVE Final    Comment: (NOTE) SARS-CoV-2 target nucleic acids are NOT DETECTED.  The SARS-CoV-2 RNA is generally detectable in upper respiratory specimens during the acute phase of infection. The lowest concentration of  SARS-CoV-2 viral copies this assay can detect is 138 copies/mL. A negative result does not preclude SARS-Cov-2 infection and should not be used as the sole basis for treatment or other patient management decisions. A negative result may occur with  improper specimen collection/handling, submission of specimen other than nasopharyngeal swab, presence of viral mutation(s) within the areas targeted by this assay, and inadequate number of viral copies(<138 copies/mL). A negative result must be combined with clinical observations, patient history, and epidemiological information. The expected result is Negative.  Fact Sheet for Patients:  EntrepreneurPulse.com.au  Fact Sheet for Healthcare Providers:  IncredibleEmployment.be  This test is no t yet approved or cleared by the [place] FDA and  has been authorized for detection and/or diagnosis of SARS-CoV-2 by FDA under an Emergency Use Authorization (EUA). This EUA will remain  in effect (meaning this test can be used) for the duration of the COVID-19 declaration under Section 564(b)(1) of the Act, 21 U.S.C.section 360bbb-3(b)(1), unless the authorization is terminated  or revoked sooner.       Influenza A by PCR NEGATIVE NEGATIVE Final   Influenza B by PCR NEGATIVE NEGATIVE Final    Comment: (NOTE) The Xpert Xpress SARS-CoV-2/FLU/RSV plus assay is intended as an aid in the diagnosis of influenza from Nasopharyngeal swab specimens and should not be used as a sole basis for treatment. Nasal washings and aspirates are unacceptable for Xpert Xpress SARS-CoV-2/FLU/RSV testing.  Fact Sheet for Patients: EntrepreneurPulse.com.au  Fact Sheet for Healthcare Providers: IncredibleEmployment.be  This test is not yet approved or cleared by the [place] FDA and has been authorized for detection and/or diagnosis of SARS-CoV-2 by FDA under an Emergency Use  Authorization (EUA). This EUA will remain in effect (meaning this test can be used) for the duration of the COVID-19 declaration under Section 564(b)(1) of the Act, 21 U.S.C. section 360bbb-3(b)(1), unless the authorization is terminated or revoked.  Performed at [facility], [address], [place], [place] [address]        Radiology Studies: DG Chest 2 View  Result Date: 03/04/2021 CLINICAL DATA:  Chest pain EXAM: CHEST - 2 VIEW COMPARISON:  03/20/2019 FINDINGS: The heart size and mediastinal contours are within normal limits. Left chest multi lead pacer. Both lungs are clear. The  visualized skeletal structures are unremarkable. IMPRESSION: No acute abnormality of the lungs. Electronically Signed   By: Eddie Candle M.D.   On: 03/04/2021 18:30    Scheduled Meds:  aspirin EC  81 mg Oral Daily   carvedilol  6.25 mg Oral BID WC   enoxaparin (LOVENOX) injection  40 mg Subcutaneous QHS   ezetimibe  10 mg Oral Daily   insulin aspart  0-9 Units Subcutaneous Q4H   mexiletine  200 mg Oral BID   rosuvastatin  40 mg Oral Daily   sodium chloride flush  3 mL Intravenous Q12H   Continuous Infusions:   LOS: 0 days   Time spent: 28 minutes   Darliss Cheney, MD [facility]  03/06/2021, 11:05 AM  Please page via [place] [place] and do not message via secure chat for anything urgent. Secure chat can be used for anything non urgent.  How to contact the [facility] Attending or Consulting provider [place] or covering provider during after hours [place], for this patient?  Check the care team in [facility] and look for a) attending/consulting [facility] provider listed and b) the [facility] team listed. Page or secure chat [phone]. Log into [email or web address] and use [place] universal password to access. If you do not have the password, please contact the hospital operator. Locate the [facility] provider you are looking for under [facility] and page to a number that you can be directly reached. If you still have  difficulty reaching the provider, please page the [facility] (Director on Call) for the Hospitalists listed on amion for assistance.

## 2021-03-07 ENCOUNTER — Encounter (HOSPITAL_COMMUNITY): Payer: Self-pay | Admitting: Cardiology

## 2021-03-07 ENCOUNTER — Encounter (HOSPITAL_COMMUNITY)
Admission: EM | Disposition: A | Discharge status: Home / Self Care | Payer: Self-pay | Source: Home / Self Care | Attending: Emergency Medicine

## 2021-03-07 DIAGNOSIS — I5022 Chronic systolic (congestive) heart failure: Secondary | ICD-10-CM | POA: Diagnosis not present

## 2021-03-07 DIAGNOSIS — I25110 Atherosclerotic heart disease of native coronary artery with unstable angina pectoris: Secondary | ICD-10-CM

## 2021-03-07 DIAGNOSIS — Z9861 Coronary angioplasty status: Secondary | ICD-10-CM

## 2021-03-07 DIAGNOSIS — R079 Chest pain, unspecified: Secondary | ICD-10-CM | POA: Diagnosis not present

## 2021-03-07 DIAGNOSIS — Z9989 Dependence on other enabling machines and devices: Secondary | ICD-10-CM

## 2021-03-07 DIAGNOSIS — Z95810 Presence of automatic (implantable) cardiac defibrillator: Secondary | ICD-10-CM | POA: Diagnosis not present

## 2021-03-07 DIAGNOSIS — I2510 Atherosclerotic heart disease of native coronary artery without angina pectoris: Secondary | ICD-10-CM | POA: Diagnosis not present

## 2021-03-07 DIAGNOSIS — G4733 Obstructive sleep apnea (adult) (pediatric): Secondary | ICD-10-CM

## 2021-03-07 DIAGNOSIS — R072 Precordial pain: Secondary | ICD-10-CM

## 2021-03-07 DIAGNOSIS — Z20822 Contact with and (suspected) exposure to covid-19: Secondary | ICD-10-CM | POA: Diagnosis not present

## 2021-03-07 DIAGNOSIS — I110 Hypertensive heart disease with heart failure: Secondary | ICD-10-CM | POA: Diagnosis not present

## 2021-03-07 HISTORY — PX: RIGHT/LEFT HEART CATH AND CORONARY ANGIOGRAPHY: CATH118266

## 2021-03-07 LAB — BASIC METABOLIC PANEL
Anion gap: 10 (ref 5–15)
BUN: 11 mg/dL (ref 6–20)
CO2: 24 mmol/L (ref 22–32)
Calcium: 8.7 mg/dL — ABNORMAL LOW (ref 8.9–10.3)
Chloride: 102 mmol/L (ref 98–111)
Creatinine, Ser: 0.99 mg/dL (ref 0.61–1.24)
GFR, Estimated: >60 mL/min (ref >60)
Glucose, Bld: 169 mg/dL — ABNORMAL HIGH (ref 70–99)
Potassium: 3.9 mmol/L (ref 3.5–5.1)
Sodium: 136 mmol/L (ref 135–145)

## 2021-03-07 LAB — POCT I-STAT EG7
Acid-Base Excess: 0 mmol/L (ref 0.0–2.0)
Acid-Base Excess: 0 mmol/L (ref 0.0–2.0)
Bicarbonate: 26.5 mmol/L (ref 20.0–28.0)
Bicarbonate: 26.6 mmol/L (ref 20.0–28.0)
Calcium, Ion: 1.2 mmol/L (ref 1.15–1.40)
Calcium, Ion: 1.2 mmol/L (ref 1.15–1.40)
HCT: 41 % (ref 39.0–52.0)
HCT: 41 % (ref 39.0–52.0)
Hemoglobin: 13.9 g/dL (ref 13.0–17.0)
Hemoglobin: 13.9 g/dL (ref 13.0–17.0)
O2 Saturation: 72 %
O2 Saturation: 72 %
Potassium: 3.9 mmol/L (ref 3.5–5.1)
Potassium: 4 mmol/L (ref 3.5–5.1)
Sodium: 140 mmol/L (ref 135–145)
Sodium: 142 mmol/L (ref 135–145)
TCO2: 28 mmol/L (ref 22–32)
TCO2: 28 mmol/L (ref 22–32)
pCO2, Ven: 50.4 mmHg (ref 44.0–60.0)
pCO2, Ven: 50.5 mmHg (ref 44.0–60.0)
pH, Ven: 7.329 (ref 7.250–7.430)
pH, Ven: 7.329 (ref 7.250–7.430)
pO2, Ven: 41 mmHg (ref 32.0–45.0)
pO2, Ven: 41 mmHg (ref 32.0–45.0)

## 2021-03-07 LAB — GLUCOSE, CAPILLARY
Glucose-Capillary: 147 mg/dL — ABNORMAL HIGH (ref 70–99)
Glucose-Capillary: 150 mg/dL — ABNORMAL HIGH (ref 70–99)
Glucose-Capillary: 156 mg/dL — ABNORMAL HIGH (ref 70–99)
Glucose-Capillary: 176 mg/dL — ABNORMAL HIGH (ref 70–99)
Glucose-Capillary: 178 mg/dL — ABNORMAL HIGH (ref 70–99)
Glucose-Capillary: 182 mg/dL — ABNORMAL HIGH (ref 70–99)
Glucose-Capillary: 183 mg/dL — ABNORMAL HIGH (ref 70–99)

## 2021-03-07 LAB — CBC
HCT: 43.7 % (ref 39.0–52.0)
Hemoglobin: 15 g/dL (ref 13.0–17.0)
MCH: 31.3 pg (ref 26.0–34.0)
MCHC: 34.3 g/dL (ref 30.0–36.0)
MCV: 91 fL (ref 80.0–100.0)
Platelets: 225 10*3/uL (ref 150–400)
RBC: 4.8 MIL/uL (ref 4.22–5.81)
RDW: 12.2 % (ref 11.5–15.5)
WBC: 6.3 10*3/uL (ref 4.0–10.5)
nRBC: 0 % (ref 0.0–0.2)

## 2021-03-07 SURGERY — RIGHT/LEFT HEART CATH AND CORONARY ANGIOGRAPHY
Anesthesia: LOCAL

## 2021-03-07 MED ORDER — NITROGLYCERIN 1 MG/10 ML FOR IR/CATH LAB
INTRA_ARTERIAL | Status: AC
  Filled 2021-03-07: qty 10

## 2021-03-07 MED ORDER — ONDANSETRON HCL 4 MG/2ML IJ SOLN: 4.0000 mg | Freq: Four times a day (QID) | INTRAMUSCULAR | Status: DC | PRN

## 2021-03-07 MED ORDER — SODIUM CHLORIDE 0.9% FLUSH: 3.0000 mL | INTRAVENOUS | Status: DC | PRN

## 2021-03-07 MED ORDER — HEPARIN (PORCINE) IN NACL 1000-0.9 UT/500ML-% IV SOLN
INTRAVENOUS | Status: AC
  Filled 2021-03-07: qty 1000

## 2021-03-07 MED ORDER — FENTANYL CITRATE (PF) 100 MCG/2ML IJ SOLN
INTRAMUSCULAR | Status: DC | PRN
  Administered 2021-03-07: 25 ug via INTRAVENOUS

## 2021-03-07 MED ORDER — SODIUM CHLORIDE 0.9% FLUSH: 3.0000 mL | Freq: Two times a day (BID) | INTRAVENOUS | Status: DC

## 2021-03-07 MED ORDER — SODIUM CHLORIDE 0.9 % IV SOLN: INTRAVENOUS | Status: AC

## 2021-03-07 MED ORDER — MIDAZOLAM HCL 2 MG/2ML IJ SOLN
INTRAMUSCULAR | Status: DC | PRN
  Administered 2021-03-07: 1 mg via INTRAVENOUS

## 2021-03-07 MED ORDER — IOHEXOL 350 MG/ML SOLN
INTRAVENOUS | Status: DC | PRN
  Administered 2021-03-07: 55 mL

## 2021-03-07 MED ORDER — MIDAZOLAM HCL 2 MG/2ML IJ SOLN
INTRAMUSCULAR | Status: AC
  Filled 2021-03-07: qty 2

## 2021-03-07 MED ORDER — LIDOCAINE HCL (PF) 1 % IJ SOLN
INTRAMUSCULAR | Status: DC | PRN
  Administered 2021-03-07: 4 mL

## 2021-03-07 MED ORDER — HEPARIN (PORCINE) IN NACL 1000-0.9 UT/500ML-% IV SOLN
INTRAVENOUS | Status: DC | PRN
  Administered 2021-03-07 (×2): 500 mL

## 2021-03-07 MED ORDER — VERAPAMIL HCL 2.5 MG/ML IV SOLN
INTRAVENOUS | Status: DC | PRN
  Administered 2021-03-07: 10 mL via INTRA_ARTERIAL

## 2021-03-07 MED ORDER — ACETAMINOPHEN 325 MG PO TABS: 650.0000 mg | ORAL_TABLET | ORAL | Status: DC | PRN

## 2021-03-07 MED ORDER — FENTANYL CITRATE (PF) 100 MCG/2ML IJ SOLN
INTRAMUSCULAR | Status: AC
  Filled 2021-03-07: qty 2

## 2021-03-07 MED ORDER — ENOXAPARIN SODIUM 40 MG/0.4ML IJ SOSY
40.0000 mg | PREFILLED_SYRINGE | INTRAMUSCULAR | Status: DC
  Administered 2021-03-08: 40 mg via SUBCUTANEOUS
  Filled 2021-03-07: qty 0.4

## 2021-03-07 MED ORDER — HEPARIN SODIUM (PORCINE) 1000 UNIT/ML IJ SOLN
INTRAMUSCULAR | Status: AC
  Filled 2021-03-07: qty 1

## 2021-03-07 MED ORDER — LABETALOL HCL 5 MG/ML IV SOLN: 10.0000 mg | INTRAVENOUS | Status: AC | PRN

## 2021-03-07 MED ORDER — NITROGLYCERIN 1 MG/10 ML FOR IR/CATH LAB
INTRA_ARTERIAL | Status: DC | PRN
  Administered 2021-03-07: 200 ug via INTRACORONARY

## 2021-03-07 MED ORDER — LIDOCAINE HCL (PF) 1 % IJ SOLN
INTRAMUSCULAR | Status: AC
  Filled 2021-03-07: qty 30

## 2021-03-07 MED ORDER — HEPARIN SODIUM (PORCINE) 1000 UNIT/ML IJ SOLN
INTRAMUSCULAR | Status: DC | PRN
  Administered 2021-03-07: 7000 [IU] via INTRAVENOUS

## 2021-03-07 MED ORDER — VERAPAMIL HCL 2.5 MG/ML IV SOLN
INTRAVENOUS | Status: AC
  Filled 2021-03-07: qty 2

## 2021-03-07 MED ORDER — SODIUM CHLORIDE 0.9 % IV SOLN: 250.0000 mL | INTRAVENOUS | Status: DC | PRN

## 2021-03-07 MED ORDER — HYDRALAZINE HCL 20 MG/ML IJ SOLN: 10.0000 mg | INTRAMUSCULAR | Status: AC | PRN

## 2021-03-07 SURGICAL SUPPLY — 10 items
CATH 5FR JL3.5 JR4 ANG PIG MP (CATHETERS) ×2 IMPLANT
CATH BALLN WEDGE 5F 110CM (CATHETERS) ×2 IMPLANT
DEVICE RAD COMP TR BAND LRG (VASCULAR PRODUCTS) ×2 IMPLANT
GLIDESHEATH SLEND SS 6F .021 (SHEATH) ×2 IMPLANT
GUIDEWIRE INQWIRE 1.5J.035X260 (WIRE) ×1 IMPLANT
INQWIRE 1.5J .035X260CM (WIRE) ×2
KIT HEART LEFT (KITS) ×2 IMPLANT
PACK CARDIAC CATHETERIZATION (CUSTOM PROCEDURE TRAY) ×2 IMPLANT
SHEATH GLIDE SLENDER 4/5FR (SHEATH) ×2 IMPLANT
TRANSDUCER W/STOPCOCK (MISCELLANEOUS) ×2 IMPLANT

## 2021-03-07 NOTE — H&P (View-Only) (Signed)
Progress Note  Patient Name: Joe [name] Date of Encounter: 03/07/2021  [facility] Cardiologist: Jenne Campus, MD   Subjective   No acute overnight events. No recurrent chest pain since Friday night. Main complaint is just general sense of not feeling well and fatigue for the last several months. Moderate activity will wipe him out for a few days. Plan is for right/left cardiac catheterization today.  Inpatient Medications    Scheduled Meds:  [START ON 03/08/2021] aspirin EC  81 mg Oral Daily   carvedilol  6.25 mg Oral BID WC   enoxaparin (LOVENOX) injection  40 mg Subcutaneous QHS   ezetimibe  10 mg Oral Daily   insulin aspart  0-9 Units Subcutaneous Q4H   mexiletine  200 mg Oral BID   rosuvastatin  40 mg Oral Daily   sodium chloride flush  3 mL Intravenous Q12H   Continuous Infusions:  sodium chloride     sodium chloride 1 mL/kg/hr (03/07/21 0600)   PRN Meds: sodium chloride, acetaminophen, ondansetron (ZOFRAN) IV, sodium chloride flush   Vital Signs    Vitals:   03/06/21 2130 03/07/21 0300 03/07/21 0511 03/07/21 0830  BP:   123/78 132/81  Pulse:   68 70  Resp:   16   Temp:   98.3 F (36.8 C) 98.2 F (36.8 C)  TempSrc:   Oral Oral  SpO2:   96% 95%  Weight: (!) 161.9 kg (!) 161.8 kg    Height:        Intake/Output Summary (Last 24 hours) at 03/07/2021 1109 Last data filed at 03/07/2021 1019 Gross per 24 hour  Intake 1288.08 ml  Output 2225 ml  Net -936.92 ml   Last 3 Weights 03/07/2021 03/06/2021 03/06/2021  Weight (lbs) 356 lb 11.2 oz 356 lb 14.4 oz 356 lb 14.4 oz  Weight (kg) 161.798 kg 161.889 kg 161.889 kg      Telemetry    V-paced rhythm with underlying sinus rhythm. Rates 60-70s. PVC noted. - Personally Reviewed  ECG    V-paced rhythm with underlying sinus rhythm and biventricular PVC. No acute ST/T changes. - Personally Reviewed  Physical Exam   GEN: No acute distress.   Neck: No JVD. Cardiac: Irregular rhythm due to PVC. Normal  rate. No murmurs, rubs, or gallops.  Respiratory: Clear to auscultation bilaterally. No wheezes, rhonchi, or rales. GI: Soft, non-distended, and non-tender. MS: No lower extremity edema. No deformity. Skin: Warm and dry. Neuro:  No focal deficits. Psych: Normal affect. Responds appropriately.  Labs    High Sensitivity Troponin:   Recent Labs  Lab 03/04/21 1705 03/04/21 1857  TROPONINIHS 4 4     Chemistry Recent Labs  Lab 03/04/21 1705 03/07/21 0504  NA 136 136  K 3.9 3.9  CL 103 102  CO2 23 24  GLUCOSE 228* 169*  BUN 17 11  CREATININE 1.29* 0.99  CALCIUM 8.9 8.7*  GFRNONAA >60 >60  ANIONGAP 10 10    Lipids No results for input(s): CHOL, TRIG, HDL, LABVLDL, LDLCALC, CHOLHDL in the last 168 hours.  Hematology Recent Labs  Lab 03/04/21 1705 03/07/21 0504  WBC 6.4 6.3  RBC 4.63 4.80  HGB 14.7 15.0  HCT 41.8 43.7  MCV 90.3 91.0  MCH 31.7 31.3  MCHC 35.2 34.3  RDW 12.4 12.2  PLT 240 225   Thyroid No results for input(s): TSH, FREET4 in the last 168 hours.  BNPNo results for input(s): BNP, PROBNP in the last 168 hours.  DDimer No results for  input(s): DDIMER in the last 168 hours.   Radiology    ECHOCARDIOGRAM COMPLETE  Result Date: 03/06/2021    ECHOCARDIOGRAM REPORT   Patient Name:   Joe [name] Date of Exam: 03/06/2021 Medical Rec #:  [id number]      Height:       75.0 in Accession #:    [id number]     Weight:       356.9 lb Date of Birth:  1970/12/20       BSA:          2.807 m Patient Age:    50 years       BP:           109/69 mmHg Patient Gender: M              HR:           63 bpm. Exam Location:  Inpatient Procedure: 2D Echo, Cardiac Doppler, Color Doppler and Intracardiac            Opacification Agent Indications:    Chest Pain R07.9  History:        Patient has prior history of Echocardiogram examinations, most                 recent 08/06/2020. CHF, CAD, Abnormal ECG, Signs/Symptoms:Chest                 Pain; Risk Factors:Diabetes, Hypertension and  Non-Smoker. RBBB.  Sonographer:    Leavy Cella RDCS Referring Phys: [place]  1. Difficult study. LVEF is moderately reduced 35-40% with global hypokinesis. Appears largely unchanged from the prior study.  2. Left ventricular ejection fraction, by estimation, is 35 to 40%. The left ventricle has moderately decreased function. The left ventricle demonstrates global hypokinesis. Left ventricular diastolic parameters are consistent with Grade I diastolic dysfunction (impaired relaxation).  3. Right ventricular systolic function is normal. The right ventricular size is mildly enlarged. There is normal pulmonary artery systolic pressure. The estimated right ventricular systolic pressure is [id number] mmHg.  4. The mitral valve is grossly normal. No evidence of mitral valve regurgitation. No evidence of mitral stenosis.  5. The aortic valve is tricuspid. Aortic valve regurgitation is not visualized. No aortic stenosis is present.  6. The inferior vena cava is normal in size with greater than 50% respiratory variability, suggesting right atrial pressure of 3 mmHg. Comparison(s): No significant change from prior study. FINDINGS  Left Ventricle: Left ventricular ejection fraction, by estimation, is 35 to 40%. The left ventricle has moderately decreased function. The left ventricle demonstrates global hypokinesis. Definity contrast agent was given IV to delineate the left ventricular endocardial borders. The left ventricular internal cavity size was normal in size. There is no left ventricular hypertrophy. Left ventricular diastolic parameters are consistent with Grade I diastolic dysfunction (impaired relaxation). Right Ventricle: The right ventricular size is mildly enlarged. No increase in right ventricular wall thickness. Right ventricular systolic function is normal. There is normal pulmonary artery systolic pressure. The tricuspid regurgitant velocity is 2.19  m/s, and with an assumed right  atrial pressure of 3 mmHg, the estimated right ventricular systolic pressure is [id number] mmHg. Left Atrium: Left atrial size was normal in size. Right Atrium: Right atrial size was normal in size. Pericardium: Trivial pericardial effusion is present. Mitral Valve: The mitral valve is grossly normal. No evidence of mitral valve regurgitation. No evidence of mitral valve stenosis. Tricuspid Valve: The tricuspid valve is grossly normal. Tricuspid valve regurgitation  is trivial. No evidence of tricuspid stenosis. Aortic Valve: The aortic valve is tricuspid. Aortic valve regurgitation is not visualized. No aortic stenosis is present. Pulmonic Valve: The pulmonic valve was normal in structure. Pulmonic valve regurgitation is not visualized. No evidence of pulmonic stenosis. Aorta: The aortic root and ascending aorta are structurally normal, with no evidence of dilitation. Venous: The inferior vena cava is normal in size with greater than 50% respiratory variability, suggesting right atrial pressure of 3 mmHg. IAS/Shunts: The atrial septum is grossly normal.  LEFT VENTRICLE PLAX 2D LVIDd:         6.30 cm  Diastology LVIDs:         4.50 cm  LV e' medial:    6.96 cm/s LV PW:         1.30 cm  LV E/e' medial:  11.5 LV IVS:        1.00 cm  LV e' lateral:   8.49 cm/s LVOT diam:     2.20 cm  LV E/e' lateral: 9.4 LVOT Area:     3.80 cm  RIGHT VENTRICLE RV S prime:     12.60 cm/s TAPSE (M-mode): 2.5 cm LEFT ATRIUM             Index       RIGHT ATRIUM           Index LA diam:        3.50 cm 1.25 cm/m  RA Area:     19.80 cm LA Vol (A2C):   51.7 ml 18.42 ml/m RA Volume:   57.10 ml  20.34 ml/m LA Vol (A4C):   88.0 ml 31.35 ml/m LA Biplane Vol: 73.8 ml 26.29 ml/m   AORTA Ao Root diam: 3.20 cm MITRAL VALVE               TRICUSPID VALVE MV Area (PHT): 3.07 cm    TR Peak grad:   19.2 mmHg MV Decel Time: 247 msec    TR Vmax:        219.00 cm/s MV E velocity: 79.70 cm/s MV A velocity: 83.30 cm/s  SHUNTS MV E/A ratio:  0.96         Systemic Diam: 2.20 cm Eleonore Chiquito MD Electronically signed by Eleonore Chiquito MD Signature Date/Time: 03/06/2021/2:30:17 PM    Final     Cardiac Studies   Echocardiogram 03/06/2021: Impressions:  1. Difficult study. LVEF is moderately reduced 35-40% with global  hypokinesis. Appears largely unchanged from the prior study.   2. Left ventricular ejection fraction, by estimation, is 35 to 40%. The  left ventricle has moderately decreased function. The left ventricle  demonstrates global hypokinesis. Left ventricular diastolic parameters are  consistent with Grade I diastolic  dysfunction (impaired relaxation).   3. Right ventricular systolic function is normal. The right ventricular  size is mildly enlarged. There is normal pulmonary artery systolic  pressure. The estimated right ventricular systolic pressure is [id number] mmHg.   4. The mitral valve is grossly normal. No evidence of mitral valve  regurgitation. No evidence of mitral stenosis.   5. The aortic valve is tricuspid. Aortic valve regurgitation is not  visualized. No aortic stenosis is present.   6. The inferior vena cava is normal in size with greater than 50%  respiratory variability, suggesting right atrial pressure of 3 mmHg.   Comparison(s): No significant change from prior study.    Patient Profile     50 y.o. male with a history of CAD s/p DES x3  to mid LAD in 08/2017 and DES x2 to LCX in [id number], chronic systolic CHF with EF of [id number] s/p St Jude ICD, multifocal PVCs, hypertension, hyperlipidemia, type 2 diabetes mellitus, and obstructive sleep apnea who was admitted on 03/04/2021 for further evaluation of intermittent chest pain and dyspnea.  Assessment & Plan    Chest Pain/Dyspnea Fatigue History of CAD - S/p DES x3 to mid LAD in 08/2017 and DES x2 to LCX in 01/2018. Most recent [place] in 06/2020 showed patent stents to LAD and LCX and otherwise nonobstructive CAD. Now presents with intermittent chest pain.  - EKG shows no acute  ischemic changes. - High-sensitivity troponin negative x2. - Echo showed LVEF of 35-40% with global hypokinesis. Largely unchanged from prior study. - Currently chest pain free. - Continue aspirin, beta-blocker, and high-intensity statin/Zetia. - Imdur and Ranexa currently on hold.Recommend trying to to restart these after cath. - Plan is for right/left cardiac catheterization today. No questions or concerns about procedure today.  Ischemic Cardiomyopathy Chronic Combined CHF - Echo this admission showed LVEF of 35-40% with global hypokinesis and grade 1 diastolic dysfunction. RV mildly enlarged with normal systolic function.  - BNP normal. - Home Medications include: Entresto 97-103 twice daily, Coreg 18.'75mg'$  twice daily, Spironolactone 12.'5mg'$  daily, Imdur '120mg'$  daily, Digoxin 0.'125mg'$  daily, and  Farxiga '10mg'$  daily. Currently everything is being held except Coreg due to soft BP and on lower dose of Coreg at 6.'25mg'$  twice daily. BP improved today with systolic BP in the [id number] to 130. Can try to slowly re-start medications after cath. Would restart Entresto first at a lower dose. Would also try to restart Imdur given significant CAD with recurrent chest pain. - Plan is for right/left cardiac catheterization today as above.  Palpitations Frequent PVCs - Patient noted intermittent episodes of palpitations with associated dizziness. Heart rates in the 140s in the office on 03/04/2021. - Device interrogated on 03/05/2021 and showed 4% PVC burden with no VT/VT or shocks. No atrial fibrillation but parameters set for 170 bpm. - Continue to have PVC on telemetry but no atrial fibrillation or sustained ventricular arrhythmia.  - Continue Mexiletine '200mg'$  twice daily. - Continue beta-blocker as above.  S/p St Jude BiV ICD - S/p CRT-D placement in 02/2019 for primary prevention. - Device interrogation as above. - Followed by Dr. Curt Bears.  Hyperlipidemia - Continue Crestor '40mg'$  daily and Zetia '10mg'$   daily.  Obstructive Sleep Pnea - Had study done in sleep lab but was not able to get a proper mask he liked. Therefore, has not been using CPAP. - Untreated sleep apnea may be contributed to patient feeling very fatigued and generally unwell. - Recommend get re-established with out sleep clinic as outpatient. Previously seen by Dr. Radford Pax.    For questions or updates, please contact [place] Please consult [email or web address] for contact info under        Signed, Darreld Mclean, PA-C  03/07/2021, 11:09 AM

## 2021-03-07 NOTE — Interval H&P Note (Signed)
History and Physical Interval Note:  03/07/2021 3:37 PM  Joe [name]  has presented today for surgery, with the diagnosis of unstable angina.  The various methods of treatment have been discussed with the patient and family. After consideration of risks, benefits and other options for treatment, the patient has consented to  Procedure(s): RIGHT/LEFT HEART CATH AND CORONARY ANGIOGRAPHY (N/A) as a surgical intervention.  The patient's history has been reviewed, patient examined, no change in status, stable for surgery.  I have reviewed the patient's chart and labs.  Questions were answered to the patient's satisfaction.     Silvano Garofano [employer]

## 2021-03-07 NOTE — Progress Notes (Signed)
PROGRESS NOTE    [name]  [id number] DOB: 02/28/1971 DOA: 03/04/2021 PCP: Cyndi Bender, PA-C   Brief Narrative:   Mr. Joe [name] is a 50 yo male with PMH  ICM, chronic systolic CHF, CAD s/p stents, AICD placement, HTN, DM2, OSA, has been followed by Dr. Agustin Cree as an outpatient and intermittently in the  AH[facility] clinic. Underwent cardiac cath in 08/2017 with severe mLAD disease treated with PCI/DESx3, dRCA 50%, mLCx 50% treated medically. Myoview in 05/2018 with EF of 30% apical and infero-apical scar.   Echo 12/2018 with EF of 20-25%, moderate RV dysfunction.    Cardiac cath several months 01/2018 with patent stents in the LAD with new 75% mLcx and 80% pLcx treated with DES x2. Continued on DAPT with ASA/Brilinta. Repeat cath in 06/2018 with all stents being patent, no intervention.    Underwent ICD placement in 02/2019 with Dr. Curt Bears [facility]) and followed as an outpatient.    Seen in the AHF with Dr. Haroldine Laws for titration of HF therapy medications. Question raised over mixed HF etiology with CAD as well as OSA/DM. He was continued on coreg, entresto, spiro, lasix with farxiga added. Ordered for a home sleep study.   Seen by Dr. Curt Bears for multifocal PVCs, high burden. This was reducing his BiV placing Placed on mexiletine    Evaluated by Dr. Radford Pax for OSA 07/2019 with recommendation for formal sleep study in the sleep lab to be fitted for full face mask.    Most recent cardiac cath 06/2020 for atypical chest pain with patent stents in the Lcx and LAD. No new targets. Echo 30-35% with severe global hypokinesis.    Was seen on the office 9/16 by Dr. Agustin Cree with complaints of chest pain and palpitations. Noted indicate his HR was elevated in the 140s. EKG done in the office noted NSR, atrial sensed, v paced. Reported palpitations and dizziness as well. Last interrogation from 8/1 with no arrhythmias. Given his on-going chest pain in the office it was advised that he present to the ED  for further evaluation.    In the ED his labs showed stable electrolytes, Cr 1.29, hsTn 4>>4, WBC 6.4, Hgb 14.7. EKG AV-paced, RBBB. CXR negative. Admitted to [facility] for further management. Cardiology consulted.  Assessment & Plan:   Principal Problem:   Chest pain Active Problems:   Essential hypertension, benign   Type 2 diabetes mellitus with complication, without long-term current use of insulin (HCC)   CAD S/P percutaneous coronary angioplasty   Chronic systolic (congestive) heart failure (HCC)   OSA on CPAP  Chest pain: Currently chest pain-free.  Enzymes negative.  EKG without any significant ischemic changes.  Cardiology on board.  Device interrogation with no VT/VF no shocks, 4% PVC burden.  No A. fib.  Cardiology plans for right and left heart cath today.  Currently on aspirin, Coreg at lower dose, Crestor, Zetia and mexiletine.  Ranexa, Aldactone and Entresto Imdur and Farxiga on hold.  Management per cardiology.  Type 2 diabetes mellitus: Continue SSI.  OSA: Continue CPAP.  Chronic systolic CHF: Appears euvolemic.  Repeat echo shows 35 to 40% ejection fraction, no different from echo done in February this year.  Management per cardiology.  Hypertension: Controlled.  Continue current regimen  DVT prophylaxis: enoxaparin (LOVENOX) injection 40 mg Start: 03/05/21 0045   Code Status: Full Code  Family Communication: Wife present at bedside.  Plan of care discussed with patient in length and he verbalized understanding and agreed with it.  Status  is: Observation  The patient will require care spanning > 2 midnights and should be moved to inpatient because: Ongoing diagnostic testing needed not appropriate for outpatient work up  Dispo: The patient is from: Home              Anticipated d/c is to: Home              Patient currently is not medically stable to d/c.   Difficult to place patient No        Estimated body mass index is 44.58 kg/m as calculated from the  following:   Height as of this encounter: '6\' 3"'$  (1.905 m).   Weight as of this encounter: 161.8 kg.     Nutritional Assessment: Body mass index is 44.58 kg/m.Marland Kitchen Seen by dietician.  I agree with the assessment and plan as outlined below: Nutrition Status:        .  Skin Assessment: I have examined the patient's skin and I agree with the wound assessment as performed by the wound care RN as outlined below:    Consultants:  Cardiology  Procedures:  None  Antimicrobials:  Anti-infectives (From admission, onward)    None          Subjective: Seen and examined with wife at the bedside.  He has no complaints.  Objective: Vitals:   03/06/21 2130 03/07/21 0300 03/07/21 0511 03/07/21 0830  BP:   123/78 132/81  Pulse:   68 70  Resp:   16   Temp:   98.3 F (36.8 C) 98.2 F (36.8 C)  TempSrc:   Oral Oral  SpO2:   96% 95%  Weight: (!) 161.9 kg (!) 161.8 kg    Height:        Intake/Output Summary (Last 24 hours) at 03/07/2021 1059 Last data filed at 03/07/2021 1019 Gross per 24 hour  Intake 1288.08 ml  Output 2225 ml  Net -936.92 ml    Filed Weights   03/06/21 0026 03/06/21 2130 03/07/21 0300  Weight: (!) 161.9 kg (!) 161.9 kg (!) 161.8 kg    Examination:  General exam: Appears calm and comfortable, morbidly obese Respiratory system: Clear to auscultation. Respiratory effort normal. Cardiovascular system: S1 & S2 heard, RRR. No JVD, murmurs, rubs, gallops or clicks. No pedal edema. Gastrointestinal system: Abdomen is nondistended, soft and nontender. No organomegaly or masses felt. Normal bowel sounds heard. Central nervous system: Alert and oriented. No focal neurological deficits. Extremities: Symmetric 5 x 5 power. Skin: No rashes, lesions or ulcers.  Psychiatry: Judgement and insight appear normal. Mood & affect appropriate.   Data Reviewed: I have personally reviewed following labs and imaging studies  CBC: Recent Labs  Lab 03/04/21 1705  03/07/21 0504  WBC 6.4 6.3  HGB 14.7 15.0  HCT 41.8 43.7  MCV 90.3 91.0  PLT 240 [id number]    Basic Metabolic Panel: Recent Labs  Lab 03/04/21 1705 03/07/21 0504  NA 136 136  K 3.9 3.9  CL 103 102  CO2 23 24  GLUCOSE 228* 169*  BUN 17 11  CREATININE 1.29* 0.99  CALCIUM 8.9 8.7*    GFR: Estimated Creatinine Clearance: 145.7 mL/min (by C-G formula based on SCr of 0.99 mg/dL). Liver Function Tests: No results for input(s): AST, ALT, ALKPHOS, BILITOT, PROT, ALBUMIN in the last 168 hours. No results for input(s): LIPASE, AMYLASE in the last 168 hours. No results for input(s): AMMONIA in the last 168 hours. Coagulation Profile: No results for input(s): INR, PROTIME in  the last 168 hours. Cardiac Enzymes: No results for input(s): CKTOTAL, CKMB, CKMBINDEX, TROPONINI in the last 168 hours. BNP (last 3 results) Recent Labs    11/04/20 1130 01/28/21 0858  PROBNP 76 26    HbA1C: Recent Labs    03/05/21 0334  HGBA1C 7.8*    CBG: Recent Labs  Lab 03/06/21 2014 03/07/21 0047 03/07/21 0425 03/07/21 0427 03/07/21 0843  GLUCAP 206* 156* 176* 182* 183*    Lipid Profile: No results for input(s): CHOL, HDL, LDLCALC, TRIG, CHOLHDL, LDLDIRECT in the last 72 hours. Thyroid Function Tests: No results for input(s): TSH, T4TOTAL, FREET4, T3FREE, THYROIDAB in the last 72 hours. Anemia Panel: No results for input(s): VITAMINB12, FOLATE, FERRITIN, TIBC, IRON, RETICCTPCT in the last 72 hours. Sepsis Labs: No results for input(s): PROCALCITON, LATICACIDVEN in the last 168 hours.  Recent Results (from the past 240 hour(s))  Resp Panel by RT-PCR (Flu A&B, Covid) Nasopharyngeal Swab     Status: None   Collection Time: 03/04/21  9:14 PM   Specimen: Nasopharyngeal Swab; Nasopharyngeal(NP) swabs in vial transport medium  Result Value Ref Range Status   SARS Coronavirus 2 by RT PCR NEGATIVE NEGATIVE Final    Comment: (NOTE) SARS-CoV-2 target nucleic acids are NOT DETECTED.  The  SARS-CoV-2 RNA is generally detectable in upper respiratory specimens during the acute phase of infection. The lowest concentration of SARS-CoV-2 viral copies this assay can detect is 138 copies/mL. A negative result does not preclude SARS-Cov-2 infection and should not be used as the sole basis for treatment or other patient management decisions. A negative result may occur with  improper specimen collection/handling, submission of specimen other than nasopharyngeal swab, presence of viral mutation(s) within the areas targeted by this assay, and inadequate number of viral copies(<138 copies/mL). A negative result must be combined with clinical observations, patient history, and epidemiological information. The expected result is Negative.  Fact Sheet for Patients:  EntrepreneurPulse.com.au  Fact Sheet for Healthcare Providers:  IncredibleEmployment.be  This test is no t yet approved or cleared by the [place] FDA and  has been authorized for detection and/or diagnosis of SARS-CoV-2 by FDA under an Emergency Use Authorization (EUA). This EUA will remain  in effect (meaning this test can be used) for the duration of the COVID-19 declaration under Section 564(b)(1) of the Act, 21 U.S.C.section 360bbb-3(b)(1), unless the authorization is terminated  or revoked sooner.       Influenza A by PCR NEGATIVE NEGATIVE Final   Influenza B by PCR NEGATIVE NEGATIVE Final    Comment: (NOTE) The Xpert Xpress SARS-CoV-2/FLU/RSV plus assay is intended as an aid in the diagnosis of influenza from Nasopharyngeal swab specimens and should not be used as a sole basis for treatment. Nasal washings and aspirates are unacceptable for Xpert Xpress SARS-CoV-2/FLU/RSV testing.  Fact Sheet for Patients: EntrepreneurPulse.com.au  Fact Sheet for Healthcare Providers: IncredibleEmployment.be  This test is not yet approved or  cleared by the [place] FDA and has been authorized for detection and/or diagnosis of SARS-CoV-2 by FDA under an Emergency Use Authorization (EUA). This EUA will remain in effect (meaning this test can be used) for the duration of the COVID-19 declaration under Section 564(b)(1) of the Act, 21 U.S.C. section 360bbb-3(b)(1), unless the authorization is terminated or revoked.  Performed at [facility], [address], [place], [place] [address]        Radiology Studies: ECHOCARDIOGRAM COMPLETE  Result Date: 03/06/2021    ECHOCARDIOGRAM REPORT   Patient  Name:   Joe [name] Date of Exam: 03/06/2021 Medical Rec #:  [id number]      Height:       75.0 in Accession #:    [id number]     Weight:       356.9 lb Date of Birth:  16-Jul-1970       BSA:          2.807 m Patient Age:    25 years       BP:           109/69 mmHg Patient Gender: M              HR:           63 bpm. Exam Location:  Inpatient Procedure: 2D Echo, Cardiac Doppler, Color Doppler and Intracardiac            Opacification Agent Indications:    Chest Pain R07.9  History:        Patient has prior history of Echocardiogram examinations, most                 recent 08/06/2020. CHF, CAD, Abnormal ECG, Signs/Symptoms:Chest                 Pain; Risk Factors:Diabetes, Hypertension and Non-Smoker. RBBB.  Sonographer:    Leavy Cella RDCS Referring Phys: [place]  1. Difficult study. LVEF is moderately reduced 35-40% with global hypokinesis. Appears largely unchanged from the prior study.  2. Left ventricular ejection fraction, by estimation, is 35 to 40%. The left ventricle has moderately decreased function. The left ventricle demonstrates global hypokinesis. Left ventricular diastolic parameters are consistent with Grade I diastolic dysfunction (impaired relaxation).  3. Right ventricular systolic function is normal. The right ventricular size is mildly enlarged. There is normal pulmonary artery  systolic pressure. The estimated right ventricular systolic pressure is [id number] mmHg.  4. The mitral valve is grossly normal. No evidence of mitral valve regurgitation. No evidence of mitral stenosis.  5. The aortic valve is tricuspid. Aortic valve regurgitation is not visualized. No aortic stenosis is present.  6. The inferior vena cava is normal in size with greater than 50% respiratory variability, suggesting right atrial pressure of 3 mmHg. Comparison(s): No significant change from prior study. FINDINGS  Left Ventricle: Left ventricular ejection fraction, by estimation, is 35 to 40%. The left ventricle has moderately decreased function. The left ventricle demonstrates global hypokinesis. Definity contrast agent was given IV to delineate the left ventricular endocardial borders. The left ventricular internal cavity size was normal in size. There is no left ventricular hypertrophy. Left ventricular diastolic parameters are consistent with Grade I diastolic dysfunction (impaired relaxation). Right Ventricle: The right ventricular size is mildly enlarged. No increase in right ventricular wall thickness. Right ventricular systolic function is normal. There is normal pulmonary artery systolic pressure. The tricuspid regurgitant velocity is 2.19  m/s, and with an assumed right atrial pressure of 3 mmHg, the estimated right ventricular systolic pressure is [id number] mmHg. Left Atrium: Left atrial size was normal in size. Right Atrium: Right atrial size was normal in size. Pericardium: Trivial pericardial effusion is present. Mitral Valve: The mitral valve is grossly normal. No evidence of mitral valve regurgitation. No evidence of mitral valve stenosis. Tricuspid Valve: The tricuspid valve is grossly normal. Tricuspid valve regurgitation is trivial. No evidence of tricuspid stenosis. Aortic Valve: The aortic valve is tricuspid. Aortic valve regurgitation is not visualized. No aortic stenosis is present. Pulmonic Valve: The  pulmonic valve was normal in structure. Pulmonic valve regurgitation is not visualized. No evidence of pulmonic stenosis. Aorta: The aortic root and ascending aorta are structurally normal, with no evidence of dilitation. Venous: The inferior vena cava is normal in size with greater than 50% respiratory variability, suggesting right atrial pressure of 3 mmHg. IAS/Shunts: The atrial septum is grossly normal.  LEFT VENTRICLE PLAX 2D LVIDd:         6.30 cm  Diastology LVIDs:         4.50 cm  LV e' medial:    6.96 cm/s LV PW:         1.30 cm  LV E/e' medial:  11.5 LV IVS:        1.00 cm  LV e' lateral:   8.49 cm/s LVOT diam:     2.20 cm  LV E/e' lateral: 9.4 LVOT Area:     3.80 cm  RIGHT VENTRICLE RV S prime:     12.60 cm/s TAPSE (M-mode): 2.5 cm LEFT ATRIUM             Index       RIGHT ATRIUM           Index LA diam:        3.50 cm 1.25 cm/m  RA Area:     19.80 cm LA Vol (A2C):   51.7 ml 18.42 ml/m RA Volume:   57.10 ml  20.34 ml/m LA Vol (A4C):   88.0 ml 31.35 ml/m LA Biplane Vol: 73.8 ml 26.29 ml/m   AORTA Ao Root diam: 3.20 cm MITRAL VALVE               TRICUSPID VALVE MV Area (PHT): 3.07 cm    TR Peak grad:   19.2 mmHg MV Decel Time: 247 msec    TR Vmax:        219.00 cm/s MV E velocity: 79.70 cm/s MV A velocity: 83.30 cm/s  SHUNTS MV E/A ratio:  0.96        Systemic Diam: 2.20 cm Eleonore Chiquito MD Electronically signed by Eleonore Chiquito MD Signature Date/Time: 03/06/2021/2:30:17 PM    Final     Scheduled Meds:  Derrill Memo ON 03/08/2021] aspirin EC  81 mg Oral Daily   carvedilol  6.25 mg Oral BID WC   enoxaparin (LOVENOX) injection  40 mg Subcutaneous QHS   ezetimibe  10 mg Oral Daily   insulin aspart  0-9 Units Subcutaneous Q4H   mexiletine  200 mg Oral BID   rosuvastatin  40 mg Oral Daily   sodium chloride flush  3 mL Intravenous Q12H   Continuous Infusions:  sodium chloride     sodium chloride 1 mL/kg/hr (03/07/21 0600)     LOS: 0 days   Time spent: 26 minutes   Darliss Cheney, MD [facility]  03/07/2021, 10:59 AM  Please page via Amion and do not message via secure chat for anything urgent. Secure chat can be used for anything non urgent.  How to contact the [facility] Attending or Consulting provider [place] or covering provider during after hours [place], for this patient?  Check the care team in [facility] and look for a) attending/consulting [facility] provider listed and b) the [facility] team listed. Page or secure chat [phone]. Log into [email or web address] and use [place]'s universal password to access. If you do not have the password, please contact the hospital operator. Locate the [facility] provider you are looking for under [facility] and page to  a number that you can be directly reached. If you still have difficulty reaching the provider, please page the [facility] (Director on Call) for the Hospitalists listed on amion for assistance.

## 2021-03-07 NOTE — Progress Notes (Signed)
Progress Note  Patient Name: Joe [name] Date of Encounter: 03/07/2021  [facility] Cardiologist: Jenne Campus, MD   Subjective   No acute overnight events. No recurrent chest pain since Friday night. Main complaint is just general sense of not feeling well and fatigue for the last several months. Moderate activity will wipe him out for a few days. Plan is for right/left cardiac catheterization today.  Inpatient Medications    Scheduled Meds:  [START ON 03/08/2021] aspirin EC  81 mg Oral Daily   carvedilol  6.25 mg Oral BID WC   enoxaparin (LOVENOX) injection  40 mg Subcutaneous QHS   ezetimibe  10 mg Oral Daily   insulin aspart  0-9 Units Subcutaneous Q4H   mexiletine  200 mg Oral BID   rosuvastatin  40 mg Oral Daily   sodium chloride flush  3 mL Intravenous Q12H   Continuous Infusions:  sodium chloride     sodium chloride 1 mL/kg/hr (03/07/21 0600)   PRN Meds: sodium chloride, acetaminophen, ondansetron (ZOFRAN) IV, sodium chloride flush   Vital Signs    Vitals:   03/06/21 2130 03/07/21 0300 03/07/21 0511 03/07/21 0830  BP:   123/78 132/81  Pulse:   68 70  Resp:   16   Temp:   98.3 F (36.8 C) 98.2 F (36.8 C)  TempSrc:   Oral Oral  SpO2:   96% 95%  Weight: (!) 161.9 kg (!) 161.8 kg    Height:        Intake/Output Summary (Last 24 hours) at 03/07/2021 1109 Last data filed at 03/07/2021 1019 Gross per 24 hour  Intake 1288.08 ml  Output 2225 ml  Net -936.92 ml   Last 3 Weights 03/07/2021 03/06/2021 03/06/2021  Weight (lbs) 356 lb 11.2 oz 356 lb 14.4 oz 356 lb 14.4 oz  Weight (kg) 161.798 kg 161.889 kg 161.889 kg      Telemetry    V-paced rhythm with underlying sinus rhythm. Rates 60-70s. PVC noted. - Personally Reviewed  ECG    V-paced rhythm with underlying sinus rhythm and biventricular PVC. No acute ST/T changes. - Personally Reviewed  Physical Exam   GEN: No acute distress.   Neck: No JVD. Cardiac: Irregular rhythm due to PVC. Normal  rate. No murmurs, rubs, or gallops.  Respiratory: Clear to auscultation bilaterally. No wheezes, rhonchi, or rales. GI: Soft, non-distended, and non-tender. MS: No lower extremity edema. No deformity. Skin: Warm and dry. Neuro:  No focal deficits. Psych: Normal affect. Responds appropriately.  Labs    High Sensitivity Troponin:   Recent Labs  Lab 03/04/21 1705 03/04/21 1857  TROPONINIHS 4 4     Chemistry Recent Labs  Lab 03/04/21 1705 03/07/21 0504  NA 136 136  K 3.9 3.9  CL 103 102  CO2 23 24  GLUCOSE 228* 169*  BUN 17 11  CREATININE 1.29* 0.99  CALCIUM 8.9 8.7*  GFRNONAA >60 >60  ANIONGAP 10 10    Lipids No results for input(s): CHOL, TRIG, HDL, LABVLDL, LDLCALC, CHOLHDL in the last 168 hours.  Hematology Recent Labs  Lab 03/04/21 1705 03/07/21 0504  WBC 6.4 6.3  RBC 4.63 4.80  HGB 14.7 15.0  HCT 41.8 43.7  MCV 90.3 91.0  MCH 31.7 31.3  MCHC 35.2 34.3  RDW 12.4 12.2  PLT 240 225   Thyroid No results for input(s): TSH, FREET4 in the last 168 hours.  BNPNo results for input(s): BNP, PROBNP in the last 168 hours.  DDimer No results for  input(s): DDIMER in the last 168 hours.   Radiology    ECHOCARDIOGRAM COMPLETE  Result Date: 03/06/2021    ECHOCARDIOGRAM REPORT   Patient Name:   Joe [name] Date of Exam: 03/06/2021 Medical Rec #:  [id number]      Height:       75.0 in Accession #:    [id number]     Weight:       356.9 lb Date of Birth:  15-Nov-1970       BSA:          2.807 m Patient Age:    50 years       BP:           109/69 mmHg Patient Gender: M              HR:           63 bpm. Exam Location:  Inpatient Procedure: 2D Echo, Cardiac Doppler, Color Doppler and Intracardiac            Opacification Agent Indications:    Chest Pain R07.9  History:        Patient has prior history of Echocardiogram examinations, most                 recent 08/06/2020. CHF, CAD, Abnormal ECG, Signs/Symptoms:Chest                 Pain; Risk Factors:Diabetes, Hypertension and  Non-Smoker. RBBB.  Sonographer:    Leavy Cella RDCS Referring Phys: [place]  1. Difficult study. LVEF is moderately reduced 35-40% with global hypokinesis. Appears largely unchanged from the prior study.  2. Left ventricular ejection fraction, by estimation, is 35 to 40%. The left ventricle has moderately decreased function. The left ventricle demonstrates global hypokinesis. Left ventricular diastolic parameters are consistent with Grade I diastolic dysfunction (impaired relaxation).  3. Right ventricular systolic function is normal. The right ventricular size is mildly enlarged. There is normal pulmonary artery systolic pressure. The estimated right ventricular systolic pressure is [id number] mmHg.  4. The mitral valve is grossly normal. No evidence of mitral valve regurgitation. No evidence of mitral stenosis.  5. The aortic valve is tricuspid. Aortic valve regurgitation is not visualized. No aortic stenosis is present.  6. The inferior vena cava is normal in size with greater than 50% respiratory variability, suggesting right atrial pressure of 3 mmHg. Comparison(s): No significant change from prior study. FINDINGS  Left Ventricle: Left ventricular ejection fraction, by estimation, is 35 to 40%. The left ventricle has moderately decreased function. The left ventricle demonstrates global hypokinesis. Definity contrast agent was given IV to delineate the left ventricular endocardial borders. The left ventricular internal cavity size was normal in size. There is no left ventricular hypertrophy. Left ventricular diastolic parameters are consistent with Grade I diastolic dysfunction (impaired relaxation). Right Ventricle: The right ventricular size is mildly enlarged. No increase in right ventricular wall thickness. Right ventricular systolic function is normal. There is normal pulmonary artery systolic pressure. The tricuspid regurgitant velocity is 2.19  m/s, and with an assumed right  atrial pressure of 3 mmHg, the estimated right ventricular systolic pressure is [id number] mmHg. Left Atrium: Left atrial size was normal in size. Right Atrium: Right atrial size was normal in size. Pericardium: Trivial pericardial effusion is present. Mitral Valve: The mitral valve is grossly normal. No evidence of mitral valve regurgitation. No evidence of mitral valve stenosis. Tricuspid Valve: The tricuspid valve is grossly normal. Tricuspid valve regurgitation  is trivial. No evidence of tricuspid stenosis. Aortic Valve: The aortic valve is tricuspid. Aortic valve regurgitation is not visualized. No aortic stenosis is present. Pulmonic Valve: The pulmonic valve was normal in structure. Pulmonic valve regurgitation is not visualized. No evidence of pulmonic stenosis. Aorta: The aortic root and ascending aorta are structurally normal, with no evidence of dilitation. Venous: The inferior vena cava is normal in size with greater than 50% respiratory variability, suggesting right atrial pressure of 3 mmHg. IAS/Shunts: The atrial septum is grossly normal.  LEFT VENTRICLE PLAX 2D LVIDd:         6.30 cm  Diastology LVIDs:         4.50 cm  LV e' medial:    6.96 cm/s LV PW:         1.30 cm  LV E/e' medial:  11.5 LV IVS:        1.00 cm  LV e' lateral:   8.49 cm/s LVOT diam:     2.20 cm  LV E/e' lateral: 9.4 LVOT Area:     3.80 cm  RIGHT VENTRICLE RV S prime:     12.60 cm/s TAPSE (M-mode): 2.5 cm LEFT ATRIUM             Index       RIGHT ATRIUM           Index LA diam:        3.50 cm 1.25 cm/m  RA Area:     19.80 cm LA Vol (A2C):   51.7 ml 18.42 ml/m RA Volume:   57.10 ml  20.34 ml/m LA Vol (A4C):   88.0 ml 31.35 ml/m LA Biplane Vol: 73.8 ml 26.29 ml/m   AORTA Ao Root diam: 3.20 cm MITRAL VALVE               TRICUSPID VALVE MV Area (PHT): 3.07 cm    TR Peak grad:   19.2 mmHg MV Decel Time: 247 msec    TR Vmax:        219.00 cm/s MV E velocity: 79.70 cm/s MV A velocity: 83.30 cm/s  SHUNTS MV E/A ratio:  0.96         Systemic Diam: 2.20 cm Eleonore Chiquito MD Electronically signed by Eleonore Chiquito MD Signature Date/Time: 03/06/2021/2:30:17 PM    Final     Cardiac Studies   Echocardiogram 03/06/2021: Impressions:  1. Difficult study. LVEF is moderately reduced 35-40% with global  hypokinesis. Appears largely unchanged from the prior study.   2. Left ventricular ejection fraction, by estimation, is 35 to 40%. The  left ventricle has moderately decreased function. The left ventricle  demonstrates global hypokinesis. Left ventricular diastolic parameters are  consistent with Grade I diastolic  dysfunction (impaired relaxation).   3. Right ventricular systolic function is normal. The right ventricular  size is mildly enlarged. There is normal pulmonary artery systolic  pressure. The estimated right ventricular systolic pressure is [id number] mmHg.   4. The mitral valve is grossly normal. No evidence of mitral valve  regurgitation. No evidence of mitral stenosis.   5. The aortic valve is tricuspid. Aortic valve regurgitation is not  visualized. No aortic stenosis is present.   6. The inferior vena cava is normal in size with greater than 50%  respiratory variability, suggesting right atrial pressure of 3 mmHg.   Comparison(s): No significant change from prior study.    Patient Profile     50 y.o. male with a history of CAD s/p DES x3  to mid LAD in 08/2017 and DES x2 to LCX in [id number], chronic systolic CHF with EF of [id number] s/p St Jude ICD, multifocal PVCs, hypertension, hyperlipidemia, type 2 diabetes mellitus, and obstructive sleep apnea who was admitted on 03/04/2021 for further evaluation of intermittent chest pain and dyspnea.  Assessment & Plan    Chest Pain/Dyspnea Fatigue History of CAD - S/p DES x3 to mid LAD in 08/2017 and DES x2 to LCX in 01/2018. Most recent [place] in 06/2020 showed patent stents to LAD and LCX and otherwise nonobstructive CAD. Now presents with intermittent chest pain.  - EKG shows no acute  ischemic changes. - High-sensitivity troponin negative x2. - Echo showed LVEF of 35-40% with global hypokinesis. Largely unchanged from prior study. - Currently chest pain free. - Continue aspirin, beta-blocker, and high-intensity statin/Zetia. - Imdur and Ranexa currently on hold.Recommend trying to to restart these after cath. - Plan is for right/left cardiac catheterization today. No questions or concerns about procedure today.  Ischemic Cardiomyopathy Chronic Combined CHF - Echo this admission showed LVEF of 35-40% with global hypokinesis and grade 1 diastolic dysfunction. RV mildly enlarged with normal systolic function.  - BNP normal. - Home Medications include: Entresto 97-103 twice daily, Coreg 18.'75mg'$  twice daily, Spironolactone 12.'5mg'$  daily, Imdur '120mg'$  daily, Digoxin 0.'125mg'$  daily, and  Farxiga '10mg'$  daily. Currently everything is being held except Coreg due to soft BP and on lower dose of Coreg at 6.'25mg'$  twice daily. BP improved today with systolic BP in the [id number] to 130. Can try to slowly re-start medications after cath. Would restart Entresto first at a lower dose. Would also try to restart Imdur given significant CAD with recurrent chest pain. - Plan is for right/left cardiac catheterization today as above.  Palpitations Frequent PVCs - Patient noted intermittent episodes of palpitations with associated dizziness. Heart rates in the 140s in the office on 03/04/2021. - Device interrogated on 03/05/2021 and showed 4% PVC burden with no VT/VT or shocks. No atrial fibrillation but parameters set for 170 bpm. - Continue to have PVC on telemetry but no atrial fibrillation or sustained ventricular arrhythmia.  - Continue Mexiletine '200mg'$  twice daily. - Continue beta-blocker as above.  S/p St Jude BiV ICD - S/p CRT-D placement in 02/2019 for primary prevention. - Device interrogation as above. - Followed by Dr. Curt Bears.  Hyperlipidemia - Continue Crestor '40mg'$  daily and Zetia '10mg'$   daily.  Obstructive Sleep Pnea - Had study done in sleep lab but was not able to get a proper mask he liked. Therefore, has not been using CPAP. - Untreated sleep apnea may be contributed to patient feeling very fatigued and generally unwell. - Recommend get re-established with out sleep clinic as outpatient. Previously seen by Dr. Radford Pax.    For questions or updates, please contact [place] Please consult [email or web address] for contact info under        Signed, Darreld Mclean, PA-C  03/07/2021, 11:09 AM

## 2021-03-08 DIAGNOSIS — R072 Precordial pain: Secondary | ICD-10-CM | POA: Diagnosis not present

## 2021-03-08 DIAGNOSIS — I5022 Chronic systolic (congestive) heart failure: Secondary | ICD-10-CM | POA: Diagnosis not present

## 2021-03-08 LAB — GLUCOSE, CAPILLARY
Glucose-Capillary: 152 mg/dL — ABNORMAL HIGH (ref 70–99)
Glucose-Capillary: 154 mg/dL — ABNORMAL HIGH (ref 70–99)
Glucose-Capillary: 192 mg/dL — ABNORMAL HIGH (ref 70–99)
Glucose-Capillary: 207 mg/dL — ABNORMAL HIGH (ref 70–99)

## 2021-03-08 MED ORDER — SACUBITRIL-VALSARTAN 24-26 MG PO TABS: 1.0000 | ORAL_TABLET | Freq: Two times a day (BID) | ORAL | 0 refills | Status: AC

## 2021-03-08 MED ORDER — RANOLAZINE ER 500 MG PO TB12
500.0000 mg | ORAL_TABLET | Freq: Two times a day (BID) | ORAL | Status: DC
  Administered 2021-03-08: 500 mg via ORAL
  Filled 2021-03-08: qty 1

## 2021-03-08 MED ORDER — RANOLAZINE ER 500 MG PO TB12: 500.0000 mg | ORAL_TABLET | Freq: Two times a day (BID) | ORAL | 0 refills | Status: DC

## 2021-03-08 MED ORDER — CARVEDILOL 6.25 MG PO TABS: 6.2500 mg | ORAL_TABLET | Freq: Two times a day (BID) | ORAL | 0 refills | Status: DC

## 2021-03-08 MED ORDER — ISOSORBIDE MONONITRATE ER 30 MG PO TB24: 30.0000 mg | ORAL_TABLET | Freq: Every day | ORAL | 0 refills | Status: DC

## 2021-03-08 MED ORDER — ISOSORBIDE MONONITRATE ER 30 MG PO TB24
30.0000 mg | ORAL_TABLET | Freq: Every day | ORAL | Status: DC
  Administered 2021-03-08: 30 mg via ORAL
  Filled 2021-03-08: qty 1

## 2021-03-08 MED ORDER — FUROSEMIDE 40 MG PO TABS: 40.0000 mg | ORAL_TABLET | Freq: Every day | ORAL | 1 refills | Status: DC

## 2021-03-08 MED ORDER — FUROSEMIDE 40 MG PO TABS
40.0000 mg | ORAL_TABLET | Freq: Every day | ORAL | Status: DC
  Administered 2021-03-08: 40 mg via ORAL
  Filled 2021-03-08: qty 1

## 2021-03-08 MED ORDER — SACUBITRIL-VALSARTAN 24-26 MG PO TABS
1.0000 | ORAL_TABLET | Freq: Two times a day (BID) | ORAL | Status: DC
  Administered 2021-03-08: 1 via ORAL
  Filled 2021-03-08: qty 1

## 2021-03-08 NOTE — Discharge Summary (Signed)
Physician Discharge Summary  Joe [name] CXK:[id number] DOB: [birth year] DOA: 03/04/2021  PCP: Cyndi Bender, PA-C  Admit date: 03/04/2021 Discharge date: 03/08/2021    Admitted From: Home Disposition: Home  Recommendations for Outpatient Follow-up:  Follow up with PCP in 1-2 weeks Please obtain BMP/CBC in one week Follow-up with Dr. Alleen Borne cardiologist in 1 week Please follow up with your PCP on the following pending results: Unresulted Labs (From admission, onward)     Start     Ordered   03/14/21 0500  Creatinine, serum  (enoxaparin (LOVENOX)    CrCl >/= 30 ml/min)  Weekly,   R     Comments: while on enoxaparin therapy    03/07/21 [name]: None Equipment/Devices: None  Discharge Condition: Stable CODE STATUS: Full code Diet recommendation: Cardiac  Subjective: Seen and examined.  He has no complaints.  He is ready to go home.  Brief/Interim Summary: Joe [name] is a 50 yo male with PMH  ICM, chronic systolic CHF, CAD s/p stents, AICD placement, HTN, DM2, OSA, has been followed by Dr. Agustin Cree as an outpatient and intermittently in the  [facility] clinic. Underwent cardiac cath in 08/2017 with severe mLAD disease treated with PCI/DESx3, dRCA 50%, mLCx 50% treated medically. Myoview in 05/2018 with EF of 30% apical and infero-apical scar. Echo 12/2018 with EF of 20-25%, moderate RV dysfunction.  Cardiac cath several months 01/2018 with patent stents in the LAD with new 75% mLcx and 80% pLcx treated with DES x2. Continued on DAPT with ASA/Brilinta. Repeat cath in 06/2018 with all stents being patent, no intervention.  Underwent ICD placement in 02/2019 with Dr. Curt [facility]) and followed as an outpatient. Seen in the AHF with Dr. Haroldine Laws for titration of HF therapy medications. Question raised over mixed HF etiology with CAD as well as OSA/DM. He was continued on coreg, entresto, spiro, lasix with farxiga added. Ordered for a home sleep study. Seen  by Dr. Curt Bears for multifocal PVCs, high burden. This was reducing his BiV placing Placed on mexiletine  Evaluated by Dr. Radford Pax for OSA 07/2019 with recommendation for formal sleep study in the sleep lab to be fitted for full face mask. Most recent cardiac cath 06/2020 for atypical chest pain with patent stents in the Lcx and LAD. No new targets. Echo 30-35% with severe global hypokinesis.    Was seen on the office 9/16 by Dr. Agustin Cree with complaints of chest pain and palpitations. Noted indicate his HR was elevated in the 140s. EKG done in the office noted NSR, atrial sensed, v paced. Reported palpitations and dizziness as well. Last interrogation from 8/1 with no arrhythmias. Given his on-going chest pain in the office it was advised that he present to the ED for further evaluation.    In the ED his labs showed stable electrolytes, Cr 1.29, hsTn 4>>4, WBC 6.4, Hgb 14.7. EKG AV-paced, RBBB. CXR negative. Admitted to [facility] for further management. Cardiology consulted.  Further hospitalization as below.   Chest pain with history of CAD, ischemic cardiomyopathy/chronic combined CHF: Enzymes negative.  EKG without any significant ischemic changes.  Cardiology on board.  Device interrogation with no VT/VF no shocks, 4% PVC burden.  No A. fib.  Underwent cardiac cath on 03/07/2021 which showed patent stents in the LAD and LCX with diffuse distal LAD which may be a trigger for angina as well as mildly elevated RA pressure with normal PCWP and LVEDP and  preserved cardiac output.  Cardiology recommended aggressive medical management.  They made some medication changes.  They reduced the dose of Ranexa and Imdur as well as Coreg and resumed low-dose Entresto 24/26 mg twice daily.  Recommended discontinuing Aldactone as well as digoxin, patient was not taking digoxin anyways.  Resuming Lasix at 40 mg p.o. daily. They cleared him for discharge and is being discharged in stable condition today.  OSA: Continue CPAP.    Hypertension: Controlled.  Continue current regimen  Discharge Diagnoses:  Principal Problem:   Chest pain Active Problems:   Essential hypertension, benign   Type 2 diabetes mellitus with complication, without long-term current use of insulin (HCC)   CAD S/P percutaneous coronary angioplasty   Chronic systolic (congestive) heart failure (HCC)   OSA on CPAP    Discharge Instructions   Allergies as of 03/08/2021   No Known Allergies      Medication List     STOP taking these medications    digoxin 0.125 MG tablet Commonly known as: LANOXIN   Entresto 97-103 MG Generic drug: sacubitril-valsartan Replaced by: sacubitril-valsartan 24-26 MG   spironolactone 25 MG tablet Commonly known as: ALDACTONE       TAKE these medications    acetaminophen 500 MG tablet Commonly known as: TYLENOL Take 1,000 mg by mouth every 6 (six) hours as needed for moderate pain or headache.   allopurinol 100 MG tablet Commonly known as: ZYLOPRIM Take 100 mg by mouth daily as needed (for gout).   aspirin EC 81 MG tablet Take 1 tablet (81 mg total) by mouth daily.   BLINK TEARS OP Place 2 drops into both eyes 2 (two) times daily as needed (for dry eyes).   carvedilol 6.25 MG tablet Commonly known as: COREG Take 1 tablet (6.25 mg total) by mouth 2 (two) times daily with a meal. What changed:  medication strength how much to take how to take this when to take this additional instructions   colchicine 0.6 MG tablet Take 0.6 mg by mouth daily as needed (Gout).   dapagliflozin propanediol 10 MG Tabs tablet Commonly known as: Farxiga Take 1 tablet (10 mg total) by mouth daily. Must keep further appointments for refills What changed: additional instructions   ezetimibe 10 MG tablet Commonly known as: ZETIA TAKE 1 TABLET BY MOUTH EVERY DAY   furosemide 40 MG tablet Commonly known as: LASIX Take 1 tablet (40 mg total) by mouth daily. What changed: how much to take    halobetasol 0.05 % cream Commonly known as: ULTRAVATE Apply 1 application topically at bedtime as needed (Eczema).   ibuprofen 200 MG tablet Commonly known as: ADVIL Take 800 mg by mouth every 8 (eight) hours as needed for headache or moderate pain.   indomethacin 50 MG capsule Commonly known as: INDOCIN Take 50 mg by mouth daily as needed for mild pain or moderate pain (Gout).   isosorbide mononitrate 30 MG 24 hr tablet Commonly known as: IMDUR Take 1 tablet (30 mg total) by mouth daily. What changed:  medication strength how much to take   metFORMIN 500 MG 24 hr tablet Commonly known as: GLUCOPHAGE-XR Take 2 tablets (1,000 mg total) by mouth in the morning and at bedtime.   mexiletine 200 MG capsule Commonly known as: MEXITIL Take 1 capsule (200 mg total) by mouth 2 (two) times daily. Please make yearly appt with Dr. Curt Bears for April 2022 for future refills. Thank you 1st attempt What changed: additional instructions   nitroGLYCERIN 0.4  MG SL tablet Commonly known as: NITROSTAT Place 1 tablet (0.4 mg total) under the tongue every 5 (five) minutes as needed for chest pain.   omeprazole 40 MG capsule Commonly known as: PRILOSEC Take 1 capsule (40 mg total) by mouth daily.   potassium chloride SA 20 MEQ tablet Commonly known as: KLOR-CON Take 20 mEq by mouth daily.   ranolazine 500 MG 12 hr tablet Commonly known as: RANEXA Take 1 tablet (500 mg total) by mouth 2 (two) times daily. What changed:  medication strength how much to take   rosuvastatin 40 MG tablet Commonly known as: CRESTOR Take 40 mg by mouth every evening.   sacubitril-valsartan 24-26 MG Commonly known as: ENTRESTO Take 1 tablet by mouth 2 (two) times daily. Replaces: Entresto 97-103 MG        Follow-up Information     Cyndi Bender, PA-C. Go on 03/15/2021.   Specialty: Physician Assistant Why: @10 :[place] information: [place] [address] [phone]          Park Liter, MD .   Specialty: Cardiology Why: Our office will call you to schedule a follow-up visit. Contact information: [address] [place] [address] [phone]         Bensimhon, Shaune Pascal, MD Follow up.   Specialty: Cardiology Why: Please keep your follow-up visit with Dr. Haroldine Laws on 03/25/2021 at 9:20am. This will be a good time to see how your BP is doing and whether we can adjust your medications more. Contact information: [address] [place] [address] [phone]         Cyndi Bender, PA-C Follow up in 1 week(s).   Specialty: Physician Assistant Contact information: [place] [address] [phone]         Park Liter, MD .   Specialty: Cardiology Contact information: [address] [place] [address] [phone]         Constance Haw, MD .   Specialty: Cardiology Contact information: [place] [address] [phone]                No Known Allergies  Consultations: Cardiology   Procedures/Studies: DG Chest 2 View  Result Date: 03/04/2021 CLINICAL DATA:  Chest pain EXAM: CHEST - 2 VIEW COMPARISON:  03/20/2019 FINDINGS: The heart size and mediastinal contours are within normal limits. Left chest multi lead pacer. Both lungs are clear. The visualized skeletal structures are unremarkable. IMPRESSION: No acute abnormality of the lungs. Electronically Signed   By: Eddie Candle M.D.   On: 03/04/2021 18:30   CARDIAC CATHETERIZATION  Result Date: 03/07/2021   Mid RCA lesion is 25% stenosed.   Dist RCA lesion is 25% stenosed with 25% stenosed side branch in RPAV.   Ost RPDA to RPDA lesion is 50% stenosed.   Ost Cx lesion is 30% stenosed.   Dist LAD lesion is 60% stenosed.   Non-stenotic Mid LAD-2 lesion was previously treated.   Non-stenotic Mid LAD-1 lesion was previously treated.   Non-stenotic Mid Cx lesion  was previously treated.   Non-stenotic Prox Cx lesion was previously treated. 1. Mildly elevated RA pressure, normal PCWP and LVEDP. 2. Preserved cardiac output. 3. Patent stents in the LAD and LCx.  Nonobstructive disease in the RCA. 4. The distal LAD is diffusely diseased.   After 200 mcg IC NTG, there appeared to be diffuse up to 60%  stenosis in the distal LAD (improved from pre-NTG).  No interventional target.  Diffuse distal LAD disease may be a trigger for angina.  Would aggressively medically manage.   ECHOCARDIOGRAM COMPLETE  Result Date: 03/06/2021    ECHOCARDIOGRAM REPORT   Patient Name:   Joe [name] Date of Exam: 03/06/2021 Medical Rec #:  [id number]      Height:       75.0 in Accession #:    [id number]     Weight:       356.9 lb Date of Birth:  [birth year]       BSA:          2.807 m Patient Age:    50 years       BP:           109/69 mmHg Patient Gender: M              HR:           63 bpm. Exam Location:  Inpatient Procedure: 2D Echo, Cardiac Doppler, Color Doppler and Intracardiac            Opacification Agent Indications:    Chest Pain R07.9  History:        Patient has prior history of Echocardiogram examinations, most                 recent 08/06/2020. CHF, CAD, Abnormal ECG, Signs/Symptoms:Chest                 Pain; Risk Factors:Diabetes, Hypertension and Non-Smoker. RBBB.  Sonographer:    Leavy Cella RDCS Referring Phys: [place]  1. Difficult study. LVEF is moderately reduced 35-40% with global hypokinesis. Appears largely unchanged from the prior study.  2. Left ventricular ejection fraction, by estimation, is 35 to 40%. The left ventricle has moderately decreased function. The left ventricle demonstrates global hypokinesis. Left ventricular diastolic parameters are consistent with Grade I diastolic dysfunction (impaired relaxation).  3. Right ventricular systolic function is normal. The right ventricular size is mildly enlarged. There is normal pulmonary  artery systolic pressure. The estimated right ventricular systolic pressure is 70.2 mmHg.  4. The mitral valve is grossly normal. No evidence of mitral valve regurgitation. No evidence of mitral stenosis.  5. The aortic valve is tricuspid. Aortic valve regurgitation is not visualized. No aortic stenosis is present.  6. The inferior vena cava is normal in size with greater than 50% respiratory variability, suggesting right atrial pressure of 3 mmHg. Comparison(s): No significant change from prior study. FINDINGS  Left Ventricle: Left ventricular ejection fraction, by estimation, is 35 to 40%. The left ventricle has moderately decreased function. The left ventricle demonstrates global hypokinesis. Definity contrast agent was given IV to delineate the left ventricular endocardial borders. The left ventricular internal cavity size was normal in size. There is no left ventricular hypertrophy. Left ventricular diastolic parameters are consistent with Grade I diastolic dysfunction (impaired relaxation). Right Ventricle: The right ventricular size is mildly enlarged. No increase in right ventricular wall thickness. Right ventricular systolic function is normal. There is normal pulmonary artery systolic pressure. The tricuspid regurgitant velocity is 2.19  m/s, and with an assumed right atrial pressure of 3 mmHg, the estimated right ventricular systolic pressure is 63.7 mmHg. Left Atrium: Left atrial size was normal in size. Right Atrium: Right atrial size was normal in size. Pericardium: Trivial pericardial effusion is present. Mitral Valve: The mitral valve is grossly normal. No evidence of mitral valve regurgitation. No  evidence of mitral valve stenosis. Tricuspid Valve: The tricuspid valve is grossly normal. Tricuspid valve regurgitation is trivial. No evidence of tricuspid stenosis. Aortic Valve: The aortic valve is tricuspid. Aortic valve regurgitation is not visualized. No aortic stenosis is present. Pulmonic Valve:  The pulmonic valve was normal in structure. Pulmonic valve regurgitation is not visualized. No evidence of pulmonic stenosis. Aorta: The aortic root and ascending aorta are structurally normal, with no evidence of dilitation. Venous: The inferior vena cava is normal in size with greater than 50% respiratory variability, suggesting right atrial pressure of 3 mmHg. IAS/Shunts: The atrial septum is grossly normal.  LEFT VENTRICLE PLAX 2D LVIDd:         6.30 cm  Diastology LVIDs:         4.50 cm  LV e' medial:    6.96 cm/s LV PW:         1.30 cm  LV E/e' medial:  11.5 LV IVS:        1.00 cm  LV e' lateral:   8.49 cm/s LVOT diam:     2.20 cm  LV E/e' lateral: 9.4 LVOT Area:     3.80 cm  RIGHT VENTRICLE RV S prime:     12.60 cm/s TAPSE (M-mode): 2.5 cm LEFT ATRIUM             Index       RIGHT ATRIUM           Index LA diam:        3.50 cm 1.25 cm/m  RA Area:     19.80 cm LA Vol (A2C):   51.7 ml 18.42 ml/m RA Volume:   57.10 ml  20.34 ml/m LA Vol (A4C):   88.0 ml 31.35 ml/m LA Biplane Vol: 73.8 ml 26.29 ml/m   AORTA Ao Root diam: 3.20 cm MITRAL VALVE               TRICUSPID VALVE MV Area (PHT): 3.07 cm    TR Peak grad:   19.2 mmHg MV Decel Time: 247 msec    TR Vmax:        219.00 cm/s MV E velocity: 79.70 cm/s MV A velocity: 83.30 cm/s  SHUNTS MV E/A ratio:  0.96        Systemic Diam: 2.20 cm Eleonore Chiquito MD Electronically signed by Eleonore Chiquito MD Signature Date/Time: 03/06/2021/2:30:17 PM    Final      Discharge Exam: Vitals:   03/07/21 2047 03/08/21 0349  BP: 134/86 (!) 145/88  Pulse: (!) 120 66  Resp: 20 19  Temp: 98.7 F (37.1 C) 97.9 F (36.6 C)  SpO2: 100% 94%   Vitals:   03/07/21 1900 03/07/21 2047 03/08/21 0345 03/08/21 0349  BP: 138/85 134/86  (!) 145/88  Pulse:  (!) 120  66  Resp: 18 20  19   Temp:  98.7 F (37.1 C)  97.9 F (36.6 C)  TempSrc:  Oral  Oral  SpO2: 94% 100%  94%  Weight:   (!) 161.8 kg   Height:        General: Pt is alert, awake, not in acute distress,  obese Cardiovascular: RRR, S1/S2 +, no rubs, no gallops Respiratory: CTA bilaterally, no wheezing, no rhonchi Abdominal: Soft, NT, ND, bowel sounds + Extremities: no edema, no cyanosis    The results of significant diagnostics from this hospitalization (including imaging, microbiology, ancillary and laboratory) are listed below for reference.     Microbiology: Recent Results (from the past 240  hour(s))  Resp Panel by RT-PCR (Flu A&B, Covid) Nasopharyngeal Swab     Status: None   Collection Time: 03/04/21  9:14 PM   Specimen: Nasopharyngeal Swab; Nasopharyngeal(NP) swabs in vial transport medium  Result Value Ref Range Status   SARS Coronavirus 2 by RT PCR NEGATIVE NEGATIVE Final    Comment: (NOTE) SARS-CoV-2 target nucleic acids are NOT DETECTED.  The SARS-CoV-2 RNA is generally detectable in upper respiratory specimens during the acute phase of infection. The lowest concentration of SARS-CoV-2 viral copies this assay can detect is 138 copies/mL. A negative result does not preclude SARS-Cov-2 infection and should not be used as the sole basis for treatment or other patient management decisions. A negative result may occur with  improper specimen collection/handling, submission of specimen other than nasopharyngeal swab, presence of viral mutation(s) within the areas targeted by this assay, and inadequate number of viral copies(<138 copies/mL). A negative result must be combined with clinical observations, patient history, and epidemiological information. The expected result is Negative.  Fact Sheet for Patients:  EntrepreneurPulse.com.au  Fact Sheet for Healthcare Providers:  IncredibleEmployment.be  This test is no t yet approved or cleared by the [place] FDA and  has been authorized for detection and/or diagnosis of SARS-CoV-2 by FDA under an Emergency Use Authorization (EUA). This EUA will remain  in effect (meaning this test can  be used) for the duration of the COVID-19 declaration under Section 564(b)(1) of the Act, 21 U.S.C.section 360bbb-3(b)(1), unless the authorization is terminated  or revoked sooner.       Influenza A by PCR NEGATIVE NEGATIVE Final   Influenza B by PCR NEGATIVE NEGATIVE Final    Comment: (NOTE) The Xpert Xpress SARS-CoV-2/FLU/RSV plus assay is intended as an aid in the diagnosis of influenza from Nasopharyngeal swab specimens and should not be used as a sole basis for treatment. Nasal washings and aspirates are unacceptable for Xpert Xpress SARS-CoV-2/FLU/RSV testing.  Fact Sheet for Patients: EntrepreneurPulse.com.au  Fact Sheet for Healthcare Providers: IncredibleEmployment.be  This test is not yet approved or cleared by the [place] FDA and has been authorized for detection and/or diagnosis of SARS-CoV-2 by FDA under an Emergency Use Authorization (EUA). This EUA will remain in effect (meaning this test can be used) for the duration of the COVID-19 declaration under Section 564(b)(1) of the Act, 21 U.S.C. section 360bbb-3(b)(1), unless the authorization is terminated or revoked.  Performed at [facility], [address] [place], [place] [address]      Labs: BNP (last 3 results) No results for input(s): BNP in the last 8760 hours. Basic Metabolic Panel: Recent Labs  Lab 03/04/21 1705 03/07/21 0504 03/07/21 1551  NA 136 136 142  140  K 3.9 3.9 3.9  4.0  CL 103 102  --   CO2 23 24  --   GLUCOSE 228* 169*  --   BUN 17 11  --   CREATININE 1.29* 0.99  --   CALCIUM 8.9 8.7*  --    Liver Function Tests: No results for input(s): AST, ALT, ALKPHOS, BILITOT, PROT, ALBUMIN in the last 168 hours. No results for input(s): LIPASE, AMYLASE in the last 168 hours. No results for input(s): AMMONIA in the last 168 hours. CBC: Recent Labs  Lab 03/04/21 1705 03/07/21 0504 03/07/21 1551  WBC 6.4 6.3  --   HGB 14.7 15.0  13.9  13.9  HCT 41.8 43.7 41.0  41.0  MCV 90.3 91.0  --   PLT 240  225  --    Cardiac Enzymes: No results for input(s): CKTOTAL, CKMB, CKMBINDEX, TROPONINI in the last 168 hours. BNP: Invalid input(s): POCBNP CBG: Recent Labs  Lab 03/07/21 1627 03/07/21 2043 03/08/21 0018 03/08/21 0348 03/08/21 0726  GLUCAP 147* 178* 192* 154* 152*   D-Dimer No results for input(s): DDIMER in the last 72 hours. Hgb A1c No results for input(s): HGBA1C in the last 72 hours. Lipid Profile No results for input(s): CHOL, HDL, LDLCALC, TRIG, CHOLHDL, LDLDIRECT in the last 72 hours. Thyroid function studies No results for input(s): TSH, T4TOTAL, T3FREE, THYROIDAB in the last 72 hours.  Invalid input(s): FREET3 Anemia work up No results for input(s): VITAMINB12, FOLATE, FERRITIN, TIBC, IRON, RETICCTPCT in the last 72 hours. Urinalysis No results found for: COLORURINE, APPEARANCEUR, [place], [place], GLUCOSEU, [place], [place], [place], PROTEINUR, UROBILINOGEN, NITRITE, LEUKOCYTESUR Sepsis Labs Invalid input(s): PROCALCITONIN,  WBC,  LACTICIDVEN Microbiology Recent Results (from the past 240 hour(s))  Resp Panel by RT-PCR (Flu A&B, Covid) Nasopharyngeal Swab     Status: None   Collection Time: 03/04/21  9:14 PM   Specimen: Nasopharyngeal Swab; Nasopharyngeal(NP) swabs in vial transport medium  Result Value Ref Range Status   SARS Coronavirus 2 by RT PCR NEGATIVE NEGATIVE Final    Comment: (NOTE) SARS-CoV-2 target nucleic acids are NOT DETECTED.  The SARS-CoV-2 RNA is generally detectable in upper respiratory specimens during the acute phase of infection. The lowest concentration of SARS-CoV-2 viral copies this assay can detect is 138 copies/mL. A negative result does not preclude SARS-Cov-2 infection and should not be used as the sole basis for treatment or other patient management decisions. A negative result may occur with  improper specimen collection/handling, submission of specimen  other than nasopharyngeal swab, presence of viral mutation(s) within the areas targeted by this assay, and inadequate number of viral copies(<138 copies/mL). A negative result must be combined with clinical observations, patient history, and epidemiological information. The expected result is Negative.  Fact Sheet for Patients:  EntrepreneurPulse.com.au  Fact Sheet for Healthcare Providers:  IncredibleEmployment.be  This test is no t yet approved or cleared by the [place] FDA and  has been authorized for detection and/or diagnosis of SARS-CoV-2 by FDA under an Emergency Use Authorization (EUA). This EUA will remain  in effect (meaning this test can be used) for the duration of the COVID-19 declaration under Section 564(b)(1) of the Act, 21 U.S.C.section 360bbb-3(b)(1), unless the authorization is terminated  or revoked sooner.       Influenza A by PCR NEGATIVE NEGATIVE Final   Influenza B by PCR NEGATIVE NEGATIVE Final    Comment: (NOTE) The Xpert Xpress SARS-CoV-2/FLU/RSV plus assay is intended as an aid in the diagnosis of influenza from Nasopharyngeal swab specimens and should not be used as a sole basis for treatment. Nasal washings and aspirates are unacceptable for Xpert Xpress SARS-CoV-2/FLU/RSV testing.  Fact Sheet for Patients: EntrepreneurPulse.com.au  Fact Sheet for Healthcare Providers: IncredibleEmployment.be  This test is not yet approved or cleared by the [place] FDA and has been authorized for detection and/or diagnosis of SARS-CoV-2 by FDA under an Emergency Use Authorization (EUA). This EUA will remain in effect (meaning this test can be used) for the duration of the COVID-19 declaration under Section 564(b)(1) of the Act, 21 U.S.C. section 360bbb-3(b)(1), unless the authorization is terminated or revoked.  Performed at [facility], [address]  [place], [place] [address]      Time coordinating discharge: Over 30 minutes  SIGNED:   Darliss Cheney, MD  [facility] 03/08/2021, 11:44 AM  If 7PM-7AM, please contact night-coverage [email or web address]

## 2021-03-08 NOTE — Progress Notes (Signed)
Patient discharging home. Vital signs stable  at time of discharge as reflected in discharge summary. Discharge instructions given and verbal understanding returned. Patient has no questions at this time.

## 2021-03-08 NOTE — Plan of Care (Signed)
  Problem: Clinical Measurements: Goal: Ability to maintain clinical measurements within normal limits will improve Outcome: Adequate for Discharge   Problem: Clinical Measurements: Goal: Diagnostic test results will improve Outcome: Adequate for Discharge

## 2021-03-08 NOTE — Progress Notes (Signed)
Progress Note  Patient Name: Joe [name] Date of Encounter: 03/08/2021  [facility] Cardiologist: Jenne Campus, MD   Subjective   No significant overnight events. Patient doing well this morning. No complains. Eager to go home.  Inpatient Medications    Scheduled Meds:  aspirin EC  81 mg Oral Daily   carvedilol  6.25 mg Oral BID WC   enoxaparin (LOVENOX) injection  40 mg Subcutaneous Q24H   ezetimibe  10 mg Oral Daily   insulin aspart  0-9 Units Subcutaneous Q4H   mexiletine  200 mg Oral BID   rosuvastatin  40 mg Oral Daily   sodium chloride flush  3 mL Intravenous Q12H   sodium chloride flush  3 mL Intravenous Q12H   Continuous Infusions:  sodium chloride     PRN Meds: sodium chloride, acetaminophen, ondansetron (ZOFRAN) IV, sodium chloride flush   Vital Signs    Vitals:   03/07/21 1900 03/07/21 2047 03/08/21 0345 03/08/21 0349  BP: 138/85 134/86  (!) 145/88  Pulse:  (!) 120  66  Resp: 18 20  19   Temp:  98.7 F (37.1 C)  97.9 F (36.6 C)  TempSrc:  Oral  Oral  SpO2: 94% 100%  94%  Weight:   (!) 161.8 kg   Height:        Intake/Output Summary (Last 24 hours) at 03/08/2021 0955 Last data filed at 03/08/2021 0350 Gross per 24 hour  Intake 1405 ml  Output 2200 ml  Net -795 ml   Last 3 Weights 03/08/2021 03/07/2021 03/06/2021  Weight (lbs) 356 lb 9.6 oz 356 lb 11.2 oz 356 lb 14.4 oz  Weight (kg) 161.753 kg 161.798 kg 161.889 kg      Telemetry    Attempted to review telemetry but nothing was showing. Therefore, unable to review date from last 24 hours.  - Personally Reviewed  ECG    No new ECG tracing today. - Personally Reviewed  Physical Exam   GEN: Obese. No acute distress.   Neck: No JVD. Cardiac: RRR. No murmurs, rubs, or gallops.  Respiratory: Clear to auscultation bilaterally. No wheezes, rhonchi, or rales. GI: Soft, non-distended, and non-tender. MS: Trace lower extremity edema bilaterally. No deformity. Skin: Warm and dry. Neuro:   No focal deficits. Psych: Normal affect. Responds appropriately.   Labs    High Sensitivity Troponin:   Recent Labs  Lab 03/04/21 1705 03/04/21 1857  TROPONINIHS 4 4     Chemistry Recent Labs  Lab 03/04/21 1705 03/07/21 0504 03/07/21 1551  NA 136 136 142  140  K 3.9 3.9 3.9  4.0  CL 103 102  --   CO2 23 24  --   GLUCOSE 228* 169*  --   BUN 17 11  --   CREATININE 1.29* 0.99  --   CALCIUM 8.9 8.7*  --   GFRNONAA >60 >60  --   ANIONGAP 10 10  --     Lipids No results for input(s): CHOL, TRIG, HDL, LABVLDL, LDLCALC, CHOLHDL in the last 168 hours.  Hematology Recent Labs  Lab 03/04/21 1705 03/07/21 0504 03/07/21 1551  WBC 6.4 6.3  --   RBC 4.63 4.80  --   HGB 14.7 15.0 13.9  13.9  HCT 41.8 43.7 41.0  41.0  MCV 90.3 91.0  --   MCH 31.7 31.3  --   MCHC 35.2 34.3  --   RDW 12.4 12.2  --   PLT 240 225  --    Thyroid No  results for input(s): TSH, FREET4 in the last 168 hours.  BNPNo results for input(s): BNP, PROBNP in the last 168 hours.  DDimer No results for input(s): DDIMER in the last 168 hours.   Radiology    CARDIAC CATHETERIZATION  Result Date: 03/07/2021   Mid RCA lesion is 25% stenosed.   Dist RCA lesion is 25% stenosed with 25% stenosed side branch in RPAV.   Ost RPDA to RPDA lesion is 50% stenosed.   Ost Cx lesion is 30% stenosed.   Dist LAD lesion is 60% stenosed.   Non-stenotic Mid LAD-2 lesion was previously treated.   Non-stenotic Mid LAD-1 lesion was previously treated.   Non-stenotic Mid Cx lesion was previously treated.   Non-stenotic Prox Cx lesion was previously treated. 1. Mildly elevated RA pressure, normal PCWP and LVEDP. 2. Preserved cardiac output. 3. Patent stents in the LAD and LCx.  Nonobstructive disease in the RCA. 4. The distal LAD is diffusely diseased.   After 200 mcg IC NTG, there appeared to be diffuse up to 60% stenosis in the distal LAD (improved from pre-NTG).  No interventional target.  Diffuse distal LAD disease may be a trigger  for angina.  Would aggressively medically manage.   ECHOCARDIOGRAM COMPLETE  Result Date: 03/06/2021    ECHOCARDIOGRAM REPORT   Patient Name:   Joe [name] Date of Exam: 03/06/2021 Medical Rec #:  [id number]      Height:       75.0 in Accession #:    [id number]     Weight:       356.9 lb Date of Birth:  22-Jul-1970       BSA:          2.807 m Patient Age:    50 years       BP:           109/69 mmHg Patient Gender: M              HR:           63 bpm. Exam Location:  Inpatient Procedure: 2D Echo, Cardiac Doppler, Color Doppler and Intracardiac            Opacification Agent Indications:    Chest Pain R07.9  History:        Patient has prior history of Echocardiogram examinations, most                 recent 08/06/2020. CHF, CAD, Abnormal ECG, Signs/Symptoms:Chest                 Pain; Risk Factors:Diabetes, Hypertension and Non-Smoker. RBBB.  Sonographer:    Leavy Cella RDCS Referring Phys: [place]  1. Difficult study. LVEF is moderately reduced 35-40% with global hypokinesis. Appears largely unchanged from the prior study.  2. Left ventricular ejection fraction, by estimation, is 35 to 40%. The left ventricle has moderately decreased function. The left ventricle demonstrates global hypokinesis. Left ventricular diastolic parameters are consistent with Grade I diastolic dysfunction (impaired relaxation).  3. Right ventricular systolic function is normal. The right ventricular size is mildly enlarged. There is normal pulmonary artery systolic pressure. The estimated right ventricular systolic pressure is 84.1 mmHg.  4. The mitral valve is grossly normal. No evidence of mitral valve regurgitation. No evidence of mitral stenosis.  5. The aortic valve is tricuspid. Aortic valve regurgitation is not visualized. No aortic stenosis is present.  6. The inferior vena cava is normal in size with greater than 50%  respiratory variability, suggesting right atrial pressure of 3 mmHg.  Comparison(s): No significant change from prior study. FINDINGS  Left Ventricle: Left ventricular ejection fraction, by estimation, is 35 to 40%. The left ventricle has moderately decreased function. The left ventricle demonstrates global hypokinesis. Definity contrast agent was given IV to delineate the left ventricular endocardial borders. The left ventricular internal cavity size was normal in size. There is no left ventricular hypertrophy. Left ventricular diastolic parameters are consistent with Grade I diastolic dysfunction (impaired relaxation). Right Ventricle: The right ventricular size is mildly enlarged. No increase in right ventricular wall thickness. Right ventricular systolic function is normal. There is normal pulmonary artery systolic pressure. The tricuspid regurgitant velocity is 2.19  m/s, and with an assumed right atrial pressure of 3 mmHg, the estimated right ventricular systolic pressure is 38.7 mmHg. Left Atrium: Left atrial size was normal in size. Right Atrium: Right atrial size was normal in size. Pericardium: Trivial pericardial effusion is present. Mitral Valve: The mitral valve is grossly normal. No evidence of mitral valve regurgitation. No evidence of mitral valve stenosis. Tricuspid Valve: The tricuspid valve is grossly normal. Tricuspid valve regurgitation is trivial. No evidence of tricuspid stenosis. Aortic Valve: The aortic valve is tricuspid. Aortic valve regurgitation is not visualized. No aortic stenosis is present. Pulmonic Valve: The pulmonic valve was normal in structure. Pulmonic valve regurgitation is not visualized. No evidence of pulmonic stenosis. Aorta: The aortic root and ascending aorta are structurally normal, with no evidence of dilitation. Venous: The inferior vena cava is normal in size with greater than 50% respiratory variability, suggesting right atrial pressure of 3 mmHg. IAS/Shunts: The atrial septum is grossly normal.  LEFT VENTRICLE PLAX 2D LVIDd:          6.30 cm  Diastology LVIDs:         4.50 cm  LV e' medial:    6.96 cm/s LV PW:         1.30 cm  LV E/e' medial:  11.5 LV IVS:        1.00 cm  LV e' lateral:   8.49 cm/s LVOT diam:     2.20 cm  LV E/e' lateral: 9.4 LVOT Area:     3.80 cm  RIGHT VENTRICLE RV S prime:     12.60 cm/s TAPSE (M-mode): 2.5 cm LEFT ATRIUM             Index       RIGHT ATRIUM           Index LA diam:        3.50 cm 1.25 cm/m  RA Area:     19.80 cm LA Vol (A2C):   51.7 ml 18.42 ml/m RA Volume:   57.10 ml  20.34 ml/m LA Vol (A4C):   88.0 ml 31.35 ml/m LA Biplane Vol: 73.8 ml 26.29 ml/m   AORTA Ao Root diam: 3.20 cm MITRAL VALVE               TRICUSPID VALVE MV Area (PHT): 3.07 cm    TR Peak grad:   19.2 mmHg MV Decel Time: 247 msec    TR Vmax:        219.00 cm/s MV E velocity: 79.70 cm/s MV A velocity: 83.30 cm/s  SHUNTS MV E/A ratio:  0.96        Systemic Diam: 2.20 cm Eleonore Chiquito MD Electronically signed by Eleonore Chiquito MD Signature Date/Time: 03/06/2021/2:30:17 PM    Final     Cardiac Studies  Echocardiogram 03/06/2021: Impressions:  1. Difficult study. LVEF is moderately reduced 35-40% with global  hypokinesis. Appears largely unchanged from the prior study.   2. Left ventricular ejection fraction, by estimation, is 35 to 40%. The  left ventricle has moderately decreased function. The left ventricle  demonstrates global hypokinesis. Left ventricular diastolic parameters are  consistent with Grade I diastolic  dysfunction (impaired relaxation).   3. Right ventricular systolic function is normal. The right ventricular  size is mildly enlarged. There is normal pulmonary artery systolic  pressure. The estimated right ventricular systolic pressure is 98.3 mmHg.   4. The mitral valve is grossly normal. No evidence of mitral valve  regurgitation. No evidence of mitral stenosis.   5. The aortic valve is tricuspid. Aortic valve regurgitation is not  visualized. No aortic stenosis is present.   6. The inferior vena cava  is normal in size with greater than 50%  respiratory variability, suggesting right atrial pressure of 3 mmHg.   Comparison(s): No significant change from prior study.  _______________  Right/Left Cardiac Catheterization 03/07/2021: Diagnostic Dominance: Right    Patient Profile     50 y.o. male  with a history of CAD s/p DES x3 to mid LAD in 08/2017 and DES x2 to LCX in 08/8248, chronic systolic CHF with EF of 53-97% s/p St Jude ICD, multifocal PVCs, hypertension, hyperlipidemia, type 2 diabetes mellitus, and obstructive sleep apnea who was admitted on 03/04/2021 for further evaluation of intermittent chest pain and dyspnea.  Assessment & Plan    Chest Pain/Dyspnea Fatigue History of CAD - S/p DES x3 to mid LAD in 08/2017 and DES x2 to LCX in 01/2018. Most recent [facility] in 06/2020 showed patent stents to LAD and LCX and otherwise nonobstructive CAD. Now presents with intermittent chest pain.  - EKG shows no acute ischemic changes. - High-sensitivity troponin negative x2. - Echo showed LVEF of 35-40% with global hypokinesis. Largely unchanged from prior study. - R/LHC yesterday showed patent stents in the LAD and LCX with diffuse distal LAD which may be a trigger for angina as well as mildly elevated RA pressure with normal PCWP and LVEDP and preserved cardiac output. - Currently chest pain free.  - Continue aspirin, beta-blocker, and high-intensity statin/Zetia. - Imdur and Ranexa currently on hold due to soft BP on admission but BP has now stabilized. Would consider restarting Imdur and Ranexa at half the home dose, 60mg  and 500mg  twice daily respectively. Will discuss with MD. - Also recommended following up with PCP for non-cardiac causes of fatigue.   Ischemic Cardiomyopathy Chronic Combined CHF - Echo this admission showed LVEF of 35-40% with global hypokinesis and grade 1 diastolic dysfunction. RV mildly enlarged with normal systolic function.  - BNP normal. - Home Medications include:  Entresto 97-103 twice daily, Coreg 18.75mg  twice daily, Spironolactone 12.5mg  daily, Imdur 120mg  daily, and  Farxiga 10mg  daily (Digoxin also listed on PTA medications but patient states he is not taking this). Currently everything is being held except Coreg due to soft BP and on lower dose of Coreg at 6.25mg  twice daily. BP improved today with systolic BP in the 673A to 150s. Would try to restart medications. Would restart Imdur as above. Would try to restart Entresto likely at 24-26mg  twice daily dosing and can then up-titrate as outpatient. Will discuss how to add back other medications with MD.   Palpitations Frequent PVCs - Patient noted intermittent episodes of palpitations with associated dizziness. Heart rates in the 140s in the office  on 03/04/2021. - Device interrogated on 03/05/2021 and showed 4% PVC burden with no VT/VT or shocks. No atrial fibrillation but parameters set for 170 bpm. - Continue Mexiletine 200mg  twice daily. - Continue beta-blocker as above.   S/p St Jude BiV ICD - S/p CRT-D placement in 02/2019 for primary prevention. - Device interrogation as above. - Followed by Dr. Curt Bears.   Hyperlipidemia - Continue Crestor 40mg  daily and Zetia 10mg  daily.   Obstructive Sleep Pnea - Had study done in sleep lab but was not able to get a proper mask he liked. Therefore, has not been using CPAP. He was able to successful use mask provided here in the hospital so he is going to see if he can take that home with him.  For questions or updates, please contact [facility] Please consult [email or web address] for contact info under        Signed, Darreld Mclean, PA-C  03/08/2021, 9:55 AM

## 2021-03-21 ENCOUNTER — Encounter: Payer: Self-pay | Admitting: Cardiology

## 2021-03-21 ENCOUNTER — Other Ambulatory Visit: Payer: Self-pay

## 2021-03-21 ENCOUNTER — Ambulatory Visit (INDEPENDENT_AMBULATORY_CARE_PROVIDER_SITE_OTHER): Payer: BC Managed Care – PPO | Admitting: Cardiology

## 2021-03-21 VITALS — BP 134/76 | HR 70 | Ht 75.0 in | Wt 362.1 lb

## 2021-03-21 DIAGNOSIS — E118 Type 2 diabetes mellitus with unspecified complications: Secondary | ICD-10-CM

## 2021-03-21 DIAGNOSIS — I4510 Unspecified right bundle-branch block: Secondary | ICD-10-CM

## 2021-03-21 DIAGNOSIS — I2510 Atherosclerotic heart disease of native coronary artery without angina pectoris: Secondary | ICD-10-CM | POA: Diagnosis not present

## 2021-03-21 DIAGNOSIS — G4733 Obstructive sleep apnea (adult) (pediatric): Secondary | ICD-10-CM

## 2021-03-21 DIAGNOSIS — I5042 Chronic combined systolic (congestive) and diastolic (congestive) heart failure: Secondary | ICD-10-CM | POA: Diagnosis not present

## 2021-03-21 DIAGNOSIS — Z9989 Dependence on other enabling machines and devices: Secondary | ICD-10-CM

## 2021-03-21 DIAGNOSIS — Z9861 Coronary angioplasty status: Secondary | ICD-10-CM

## 2021-03-21 MED ORDER — MEXILETINE HCL 200 MG PO CAPS: 200.0000 mg | ORAL_CAPSULE | Freq: Two times a day (BID) | ORAL | 0 refills | Status: DC

## 2021-03-21 MED ORDER — DAPAGLIFLOZIN PROPANEDIOL 10 MG PO TABS
10.0000 mg | ORAL_TABLET | Freq: Every day | ORAL | 2 refills | Status: DC
Start: 2021-03-21 — End: 2023-02-20

## 2021-03-21 NOTE — Progress Notes (Signed)
Cardiology Office Note:    Date:  03/21/2021   ID:  Joe [name], DOB Feb 11, 1971, MRN [id number]  PCP:  Cyndi Bender, PA-C  Cardiologist:  Jenne Campus, MD    Referring MD: Cyndi Bender, PA-C   No chief complaint on file. I was in the hospital  History of Present Illness:    Joe [name] is a 50 y.o. male    with past medical history significant for coronary artery disease in March 2019 he required stenting to mid to distal LAD after that he was doing quite nicely but then started getting symptoms again did have a cardiac catheterization in January 2021 which showed 40% stenosis of distal LAD stents were patent, mid RCA had about 25% lesion PDA also got to 50% stenosis.  Circumflex was normal.  At the same time he was noted to have diminished left ventricle ejection fraction with echocardiogram showing ejection fraction 20 to 25%.  Eventually on March 19, 2019 he did receive CRT-D In January 2022 he did have a cardiac catheterization for atypical chest pain however did not show any target lesion for interventions. I saw him couple weeks ago and he being sent to the emergency room done he is complaining where not feeling well some chest pain.  Cardiac catheterization was done in the hospital which showed no target lesion for intervention luckily his ejection fraction seems to be improving about 35 to 40%.  He comes today 2 months for follow-up still complain of being weak tired and exhausted actually is taking about retiring/disability.  Denies have any chest pain tightness squeezing pressure burning chest just fatigue and shortness of breath  Past Medical History:  Diagnosis Date   Abnormal EKG    Acute systolic heart failure ([place]) 09/06/2017   Angina pectoris ([place]) 09/05/2017   Body mass index 45.0-49.9, adult (HCC)    CAD S/P percutaneous coronary angioplasty 09/07/2017   mLAD and dLAD PCI with DES 09/06/17   CHF (congestive heart failure) (HCC)    Chronic systolic  (congestive) heart failure ([place]) 1/82/9937   Complication of anesthesia 1995   "had hard time waking up; breathing w/vasectomy"   Coronary artery disease    Erectile dysfunction    Essential hypertension, benign    Fatigue    GERD (gastroesophageal reflux disease)    Gout    High cholesterol    "just started tx yesterday" (09/06/2017)   History of gout    "RX prn" (09/06/2017)   Ischemic cardiomyopathy 09/07/2017   EF 25-35%   Muscular chest pain    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   Below Left Nare (MOH's)   Obesity    Obstructive sleep apnea    OSA on CPAP    Plantar fasciitis    Pre-operative clearance 02/11/2018   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/02/2019   Saint Jude   Right bundle branch block    Shortness of breath 09/05/2017   Testosterone deficiency    Type 2 diabetes mellitus with complication, without long-term current use of insulin ([place]) 09/05/2017   Type II diabetes mellitus ([place])    "started tx 08/28/2017"    Past Surgical History:  Procedure Laterality Date   BACK SURGERY     BIV ICD INSERTION CRT-D N/A 03/19/2019   Procedure: BIV ICD INSERTION CRT-D;  Surgeon: Constance Haw, MD;  Location: [facility];  Service: Cardiovascular;  Laterality: N/A;   CORONARY ANGIOPLASTY WITH STENT PLACEMENT  09/06/2017   "3 stents"  CORONARY STENT INTERVENTION N/A 09/06/2017   Procedure: CORONARY STENT INTERVENTION;  Surgeon: Jettie Booze, MD;  Location: [facility];  Service: Cardiovascular;  Laterality: N/A;   CORONARY STENT INTERVENTION N/A 02/15/2018   Procedure: CORONARY STENT INTERVENTION;  Surgeon: Jettie Booze, MD;  Location: [facility];  Service: Cardiovascular;  Laterality: N/A;   KNEE ARTHROSCOPY Right    LEFT HEART CATH AND CORONARY ANGIOGRAPHY N/A 09/06/2017   Procedure: LEFT HEART CATH AND CORONARY ANGIOGRAPHY;  Surgeon: Jettie Booze, MD;  Location: [facility];  Service: Cardiovascular;  Laterality: N/A;   LEFT HEART CATH AND CORONARY ANGIOGRAPHY N/A 02/15/2018   Procedure: LEFT HEART CATH AND CORONARY ANGIOGRAPHY;  Surgeon: Jettie Booze, MD;  Location: [facility];  Service: Cardiovascular;  Laterality: N/A;   LEFT HEART CATH AND CORONARY ANGIOGRAPHY N/A 06/27/2018   Procedure: LEFT HEART CATH AND CORONARY ANGIOGRAPHY;  Surgeon: Jettie Booze, MD;  Location: [facility];  Service: Cardiovascular;  Laterality: N/A;   LEFT HEART CATH AND CORONARY ANGIOGRAPHY N/A 06/28/2020   Procedure: LEFT HEART CATH AND CORONARY ANGIOGRAPHY;  Surgeon: Martinique, Peter M, MD;  Location: [facility];  Service: Cardiovascular;  Laterality: N/A;   LUMBAR SPINE SURGERY  ~ 2001   Intradiscal Electrothermoplasty   RIGHT/LEFT HEART CATH AND CORONARY ANGIOGRAPHY N/A 03/07/2021   Procedure: RIGHT/LEFT HEART CATH AND CORONARY ANGIOGRAPHY;  Surgeon: Larey Dresser, MD;  Location: [facility];  Service: Cardiovascular;  Laterality: N/A;   VASECTOMY  1995    Current Medications: Current Meds  Medication Sig   acetaminophen (TYLENOL) 500 MG tablet Take 1,000 mg by mouth every 6 (six) hours as needed for moderate pain or headache.   allopurinol (ZYLOPRIM) 100 MG tablet Take 100 mg by mouth daily as needed (for gout).    aspirin EC 81 MG tablet Take 1 tablet (81 mg total) by mouth daily.   carvedilol (COREG) 6.25 MG tablet Take 1 tablet (6.25 mg total) by mouth 2 (two) times daily with a meal.   colchicine 0.6 MG tablet Take 0.6 mg by mouth daily as needed (Gout).   dapagliflozin propanediol (FARXIGA) 10 MG TABS tablet Take 1 tablet (10 mg total) by mouth daily. Must keep further appointments for refills (Patient taking differently: Take 10 mg by mouth daily.)   ezetimibe (ZETIA) 10 MG tablet TAKE 1 TABLET BY MOUTH EVERY DAY (Patient taking differently: Take 10 mg by mouth daily.)   furosemide (LASIX) 40 MG tablet Take 1 tablet (40 mg  total) by mouth daily.   halobetasol (ULTRAVATE) 0.05 % cream Apply 1 application topically at bedtime as needed (Eczema).   ibuprofen (ADVIL) 200 MG tablet Take 800 mg by mouth every 8 (eight) hours as needed for headache or moderate pain.   indomethacin (INDOCIN) 50 MG capsule Take 50 mg by mouth daily as needed for mild pain or moderate pain (Gout).   isosorbide mononitrate (IMDUR) 30 MG 24 hr tablet Take 1 tablet (30 mg total) by mouth daily.   metFORMIN (GLUCOPHAGE-XR) 500 MG 24 hr tablet Take 2 tablets (1,000 mg total) by mouth in the morning and at bedtime.   mexiletine (MEXITIL) 200 MG capsule Take 1 capsule (200 mg total) by mouth 2 (two) times daily. Please make yearly appt with Dr. Curt Bears for April 2022 for future refills. Thank you 1st attempt (Patient taking differently: Take 200 mg by mouth 2 (two) times daily.)   nitroGLYCERIN (  NITROSTAT) 0.4 MG SL tablet Place 1 tablet (0.4 mg total) under the tongue every 5 (five) minutes as needed for chest pain.   omeprazole (PRILOSEC) 40 MG capsule Take 1 capsule (40 mg total) by mouth daily.   Polyethylene Glycol 400 (BLINK TEARS OP) Place 2 drops into both eyes 2 (two) times daily as needed (for dry eyes).   potassium chloride SA (KLOR-CON) 20 MEQ tablet Take 20 mEq by mouth daily.   ranolazine (RANEXA) 500 MG 12 hr tablet Take 1 tablet (500 mg total) by mouth 2 (two) times daily.   rosuvastatin (CRESTOR) 40 MG tablet Take 40 mg by mouth every evening.   sacubitril-valsartan (ENTRESTO) 24-26 MG Take 1 tablet by mouth 2 (two) times daily.     Allergies:   Patient has no known allergies.   Social History   Socioeconomic History   Marital status: Married    Spouse name: Not on file   Number of children: Not on file   Years of education: Not on file   Highest education level: Not on file  Occupational History   Not on file  Tobacco Use   Smoking status: Never   Smokeless tobacco: Never  Vaping Use   Vaping Use: Never used   Substance and Sexual Activity   Alcohol use: No   Drug use: Never   Sexual activity: Not on file  Other Topics Concern   Not on file  Social History Narrative   Not on file   Social Determinants of Health   Financial Resource Strain: Not on file  Food Insecurity: Not on file  Transportation Needs: Not on file  Physical Activity: Not on file  Stress: Not on file  Social Connections: Not on file     Family History: The patient's family history includes Brain cancer in his mother; Diabetes Mellitus II in his brother and paternal grandfather; Lung cancer in his mother; Lymphoma in his father. ROS:   Please see the history of present illness.    All 14 point review of systems negative except as described per history of present illness  EKGs/Labs/Other Studies Reviewed:      Recent Labs: 08/06/2020: Magnesium 2.5 11/11/2020: TSH 2.980 01/28/2021: ALT 24; NT-Pro BNP 26 03/07/2021: BUN 11; Creatinine, Ser 0.99; Hemoglobin 13.9; Hemoglobin 13.9; Platelets 225; Potassium 4.0; Potassium 3.9; Sodium 140; Sodium 142  Recent Lipid Panel    Component Value Date/Time   CHOL 124 03/27/2018 0940   TRIG 218 (H) 03/27/2018 0940   HDL 35 (L) 03/27/2018 0940   CHOLHDL 3.5 03/27/2018 0940   LDLCALC 45 03/27/2018 0940    Physical Exam:    VS:  BP 134/76 (BP Location: Left Arm, Patient Position: Sitting, Cuff Size: Large)   Pulse 70   Ht 6\' 3"  (1.905 m)   Wt (!) 362 lb 1.9 oz (164.3 kg)   SpO2 95%   BMI 45.26 kg/m     Wt Readings from Last 3 Encounters:  03/21/21 (!) 362 lb 1.9 oz (164.3 kg)  03/08/21 (!) 356 lb 9.6 oz (161.8 kg)  03/04/21 (!) 361 lb (163.7 kg)     GEN:  Well nourished, well developed in no acute distress HEENT: Normal NECK: No JVD; No carotid bruits LYMPHATICS: No lymphadenopathy CARDIAC: RRR, no murmurs, no rubs, no gallops RESPIRATORY:  Clear to auscultation without rales, wheezing or rhonchi  ABDOMEN: Soft, non-tender, non-distended MUSCULOSKELETAL:  No  edema; No deformity  SKIN: Warm and dry LOWER EXTREMITIES: no swelling NEUROLOGIC:  Alert and  oriented x 3 PSYCHIATRIC:  Normal affect   ASSESSMENT:    1. CAD S/P percutaneous coronary angioplasty   2. Chronic combined systolic and diastolic congestive heart failure ([place])   3. Right bundle branch block   4. OSA on CPAP   5. Type 2 diabetes mellitus with complication, without long-term current use of insulin (HCC)    PLAN:    In order of problems listed above:  Coronary disease recent cardiac catheterization reviewed: No target lesion for intervention continue present management. Chronic congestive heart failure appears to be compensated we will continue present management Obstructive sleep apnea: CPAP mask which we will continue Diabetes I have his last hemoglobin A1c from March 15, 2021 which was 7.9 clearly he needs to have better control diabetes. Dyslipidemia: I do not have his fasting lipid profile.  We will do cholesterol today.   Medication Adjustments/Labs and Tests Ordered: Current medicines are reviewed at length with the patient today.  Concerns regarding medicines are outlined above.  No orders of the defined types were placed in this encounter.  Medication changes: No orders of the defined types were placed in this encounter.   Signed, Park Liter, MD, [facility] 03/21/2021 8:34 AM    [place]

## 2021-03-21 NOTE — Addendum Note (Signed)
Addended by: Senaida Ores on: 03/21/2021 08:37 AM   Modules accepted: Orders

## 2021-03-21 NOTE — Patient Instructions (Signed)
Medication Instructions:  Your physician recommends that you continue on your current medications as directed. Please refer to the Current Medication list given to you today.  *If you need a refill on your cardiac medications before your next appointment, please call your pharmacy*   Lab Work: Your physician recommends that you return for lab work today: lipid  If you have labs (blood work) drawn today and your tests are completely normal, you will receive your results only by: . MyChart Message (if you have MyChart) OR . A paper copy in the mail If you have any lab test that is abnormal or we need to change your treatment, we will call you to review the results.   Testing/Procedures: None   Follow-Up: At [facility], you and your health needs are our priority.  As part of our continuing mission to provide you with exceptional heart care, we have created designated Provider Care Teams.  These Care Teams include your primary Cardiologist (physician) and Advanced Practice Providers (APPs -  Physician Assistants and Nurse Practitioners) who all work together to provide you with the care you need, when you need it.  We recommend signing up for the patient portal called "MyChart".  Sign up information is provided on this After Visit Summary.  MyChart is used to connect with patients for Virtual Visits (Telemedicine).  Patients are able to view lab/test results, encounter notes, upcoming appointments, etc.  Non-urgent messages can be sent to your provider as well.   To learn more about what you can do with MyChart, go to [email or web address].    Your next appointment:   3 month(s)  The format for your next appointment:   In Person  Provider:   Robert Krasowski, MD   Other Instructions   

## 2021-03-22 LAB — LIPID PANEL
Chol/HDL Ratio: 3.3 ratio (ref 0.0–5.0)
Cholesterol, Total: 103 mg/dL (ref 100–199)
HDL: 31 mg/dL — ABNORMAL LOW (ref >39)
LDL Chol Calc (NIH): 34 mg/dL (ref 0–99)
Triglycerides: 247 mg/dL — ABNORMAL HIGH (ref 0–149)
VLDL Cholesterol Cal: 38 mg/dL (ref 5–40)

## 2021-03-25 ENCOUNTER — Other Ambulatory Visit: Payer: Self-pay

## 2021-03-25 ENCOUNTER — Ambulatory Visit (HOSPITAL_COMMUNITY)
Admission: RE | Admit: 2021-03-25 | Discharge: 2021-03-25 | Disposition: A | Discharge status: Home / Self Care | Payer: BC Managed Care – PPO | Source: Ambulatory Visit | Attending: Internal Medicine | Admitting: Internal Medicine

## 2021-03-25 ENCOUNTER — Encounter (HOSPITAL_COMMUNITY): Payer: Self-pay | Admitting: Internal Medicine

## 2021-03-25 VITALS — BP 110/70 | HR 81 | Wt 365.6 lb

## 2021-03-25 DIAGNOSIS — Z7982 Long term (current) use of aspirin: Secondary | ICD-10-CM | POA: Insufficient documentation

## 2021-03-25 DIAGNOSIS — E118 Type 2 diabetes mellitus with unspecified complications: Secondary | ICD-10-CM

## 2021-03-25 DIAGNOSIS — E119 Type 2 diabetes mellitus without complications: Secondary | ICD-10-CM | POA: Insufficient documentation

## 2021-03-25 DIAGNOSIS — Z79899 Other long term (current) drug therapy: Secondary | ICD-10-CM | POA: Insufficient documentation

## 2021-03-25 DIAGNOSIS — Z7984 Long term (current) use of oral hypoglycemic drugs: Secondary | ICD-10-CM | POA: Insufficient documentation

## 2021-03-25 DIAGNOSIS — I5022 Chronic systolic (congestive) heart failure: Secondary | ICD-10-CM

## 2021-03-25 DIAGNOSIS — Z9989 Dependence on other enabling machines and devices: Secondary | ICD-10-CM

## 2021-03-25 DIAGNOSIS — I493 Ventricular premature depolarization: Secondary | ICD-10-CM | POA: Diagnosis not present

## 2021-03-25 DIAGNOSIS — Z6841 Body Mass Index (BMI) 40.0 and over, adult: Secondary | ICD-10-CM | POA: Insufficient documentation

## 2021-03-25 DIAGNOSIS — G4733 Obstructive sleep apnea (adult) (pediatric): Secondary | ICD-10-CM

## 2021-03-25 DIAGNOSIS — R0789 Other chest pain: Secondary | ICD-10-CM | POA: Diagnosis not present

## 2021-03-25 DIAGNOSIS — I110 Hypertensive heart disease with heart failure: Secondary | ICD-10-CM | POA: Insufficient documentation

## 2021-03-25 DIAGNOSIS — I2510 Atherosclerotic heart disease of native coronary artery without angina pectoris: Secondary | ICD-10-CM | POA: Diagnosis not present

## 2021-03-25 DIAGNOSIS — Z955 Presence of coronary angioplasty implant and graft: Secondary | ICD-10-CM | POA: Insufficient documentation

## 2021-03-25 DIAGNOSIS — E7800 Pure hypercholesterolemia, unspecified: Secondary | ICD-10-CM | POA: Diagnosis not present

## 2021-03-25 DIAGNOSIS — Z95810 Presence of automatic (implantable) cardiac defibrillator: Secondary | ICD-10-CM | POA: Diagnosis not present

## 2021-03-25 LAB — BASIC METABOLIC PANEL
Anion gap: 7 (ref 5–15)
BUN: 10 mg/dL (ref 6–20)
CO2: 26 mmol/L (ref 22–32)
Calcium: 8.8 mg/dL — ABNORMAL LOW (ref 8.9–10.3)
Chloride: 104 mmol/L (ref 98–111)
Creatinine, Ser: 0.97 mg/dL (ref 0.61–1.24)
GFR, Estimated: >60 mL/min (ref >60)
Glucose, Bld: 232 mg/dL — ABNORMAL HIGH (ref 70–99)
Potassium: 4.1 mmol/L (ref 3.5–5.1)
Sodium: 137 mmol/L (ref 135–145)

## 2021-03-25 LAB — BRAIN NATRIURETIC PEPTIDE: B Natriuretic Peptide: 11.8 pg/mL (ref 0.0–100.0)

## 2021-03-25 MED ORDER — SPIRONOLACTONE 25 MG PO TABS: 12.5000 mg | ORAL_TABLET | Freq: Every day | ORAL | 3 refills | Status: DC

## 2021-03-25 NOTE — Addendum Note (Signed)
Encounter addended by: Scarlette Calico, RN on: 03/25/2021 10:16 AM  Actions taken: Diagnosis association updated, Order list changed, Pharmacy for encounter modified, Charge Capture section accepted, Clinical Note Signed

## 2021-03-25 NOTE — Progress Notes (Signed)
ADVANCED HF CLINIC CONSULT NOTE  Referring Physician: Agustin Cree Primary Care: Cyndi Bender Primary Cardiologist: Agustin Cree   HPI:  Joe [name] is a 50 y/o male with h/o morbid obesity, DM2, HTN, HL, OSA, CAD and systolic HF (EF 01-75%) referred by Dr. Agustin Cree for further management of his HF   Has h/o RBBB but never had any other heart problems until 3/19. Saw Dr. Drue Dun in 3/19 due to CP and SOB. Cath EF 25-35% Severe diabetic CAD with LAD disease (m90 and m75),  RCA d50, LCA m50.   Echo 7/20 EF 20-25% moderate RV dysfunction  Cath January 2020 which showed 40% distal LAD, LAD/LCx stents were patent, mid RCA got 25% lesion right PDA had 50% stenosis the circumflex artery were open.  No intervention has been required.    Had cath 1/22 with stable CAD  Myoview 12/19 EF 30% Apical and infero-apical scar  Had St. Jude CRT-D placed 03/19/19 with Dr. Curt Bears.  We have not seen him for 2 years. Admitted 9/22 for CP. HsTn was 4. Underwent cardiac cath on 03/07/2021 which showed patent stents in the LAD and LCX with diffuse distal LAD which may be a trigger for angina as well as mildly elevated RA pressure with normal PCWP and LVEDP and preserved cardiac output.   Echo 9/22 EF 35-40%  Says he feels ok today. Has been having frequent chest pressure lately. Also mildly SOB. Feels like changes in meds have helped. No edema, orthopnea or PND. Not wearing CPAP regularly.    RHC  RA 11 RV 40/9 PA 36/7 (20) PCPW 12 LVEDP 8  Fick 6.5/2.3 PA Sat 72% PVR 1.2 WU     Past Medical History:  Diagnosis Date   Abnormal EKG    Acute systolic heart failure ([place]) 09/06/2017   Angina pectoris ([place]) 09/05/2017   Body mass index 45.0-49.9, adult (HCC)    CAD S/P percutaneous coronary angioplasty 09/07/2017   mLAD and dLAD PCI with DES 09/06/17   CHF (congestive heart failure) (HCC)    Chronic systolic (congestive) heart failure ([place]) 06/20/5850   Complication of anesthesia 1995   "had  hard time waking up; breathing w/vasectomy"   Coronary artery disease    Erectile dysfunction    Essential hypertension, benign    Fatigue    GERD (gastroesophageal reflux disease)    Gout    High cholesterol    "just started tx yesterday" (09/06/2017)   History of gout    "RX prn" (09/06/2017)   Ischemic cardiomyopathy 09/07/2017   EF 25-35%   Muscular chest pain    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   Below Left Nare (MOH's)   Obesity    Obstructive sleep apnea    OSA on CPAP    Plantar fasciitis    Pre-operative clearance 02/11/2018   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/02/2019   Saint Jude   Right bundle branch block    Shortness of breath 09/05/2017   Testosterone deficiency    Type 2 diabetes mellitus with complication, without long-term current use of insulin ([place]) 09/05/2017   Type II diabetes mellitus ([place])    "started tx 08/28/2017"    Current Outpatient Medications  Medication Sig Dispense Refill   acetaminophen (TYLENOL) 500 MG tablet Take 1,000 mg by mouth every 6 (six) hours as needed for moderate pain or headache.     allopurinol (ZYLOPRIM) 100 MG tablet Take 100 mg by mouth daily as needed (for gout).  aspirin EC 81 MG tablet Take 1 tablet (81 mg total) by mouth daily. 90 tablet 3   carvedilol (COREG) 6.25 MG tablet Take 1 tablet (6.25 mg total) by mouth 2 (two) times daily with a meal. 60 tablet 0   colchicine 0.6 MG tablet Take 0.6 mg by mouth daily as needed (Gout).     dapagliflozin propanediol (FARXIGA) 10 MG TABS tablet Take 1 tablet (10 mg total) by mouth daily. 90 tablet 2   ezetimibe (ZETIA) 10 MG tablet TAKE 1 TABLET BY MOUTH EVERY DAY 90 tablet 1   furosemide (LASIX) 40 MG tablet Take 1 tablet (40 mg total) by mouth daily. 90 tablet 1   halobetasol (ULTRAVATE) 0.05 % cream Apply 1 application topically at bedtime as needed (Eczema).     ibuprofen (ADVIL) 200 MG tablet Take 800 mg by mouth every 8 (eight)  hours as needed for headache or moderate pain.     indomethacin (INDOCIN) 50 MG capsule Take 50 mg by mouth daily as needed for mild pain or moderate pain (Gout).     isosorbide mononitrate (IMDUR) 30 MG 24 hr tablet Take 1 tablet (30 mg total) by mouth daily. 30 tablet 0   metFORMIN (GLUCOPHAGE-XR) 500 MG 24 hr tablet Take 2 tablets (1,000 mg total) by mouth in the morning and at bedtime.     mexiletine (MEXITIL) 200 MG capsule Take 1 capsule (200 mg total) by mouth 2 (two) times daily. Please make yearly appt with Dr. Curt Bears for April 2022 for future refills. Thank you 1st attempt 60 capsule 0   nitroGLYCERIN (NITROSTAT) 0.4 MG SL tablet Place 1 tablet (0.4 mg total) under the tongue every 5 (five) minutes as needed for chest pain. 25 tablet 6   omeprazole (PRILOSEC) 40 MG capsule Take 1 capsule (40 mg total) by mouth daily. 90 capsule 1   Polyethylene Glycol 400 (BLINK TEARS OP) Place 2 drops into both eyes 2 (two) times daily as needed (for dry eyes).     potassium chloride SA (KLOR-CON) 20 MEQ tablet Take 20 mEq by mouth daily.     ranolazine (RANEXA) 500 MG 12 hr tablet Take 1 tablet (500 mg total) by mouth 2 (two) times daily. 60 tablet 0   rosuvastatin (CRESTOR) 40 MG tablet Take 40 mg by mouth every evening.     sacubitril-valsartan (ENTRESTO) 24-26 MG Take 1 tablet by mouth 2 (two) times daily. 60 tablet 0   No current facility-administered medications for this encounter.    No Known Allergies    Social History   Socioeconomic History   Marital status: Married    Spouse name: Not on file   Number of children: Not on file   Years of education: Not on file   Highest education level: Not on file  Occupational History   Not on file  Tobacco Use   Smoking status: Never   Smokeless tobacco: Never  Vaping Use   Vaping Use: Never used  Substance and Sexual Activity   Alcohol use: No   Drug use: Never   Sexual activity: Not on file  Other Topics Concern   Not on file  Social  History Narrative   Not on file   Social Determinants of Health   Financial Resource Strain: Not on file  Food Insecurity: Not on file  Transportation Needs: Not on file  Physical Activity: Not on file  Stress: Not on file  Social Connections: Not on file  Intimate Partner Violence: Not on file  Family History  Problem Relation Age of Onset   Lymphoma Father    Diabetes Mellitus II Brother    Diabetes Mellitus II Paternal Grandfather    Lung cancer Mother    Brain cancer Mother     Vitals:   03/25/21 0933  BP: 110/70  Pulse: 81  SpO2: 95%  Weight: (!) 165.8 kg (365 lb 9.6 oz)    PHYSICAL EXAM: General:  Well appearing. No resp difficulty HEENT: normal Neck: supple. no JVD. Carotids 2+ bilat; no bruits. No lymphadenopathy or thryomegaly appreciated. Cor: PMI nondisplaced. Regular rate & rhythm. No rubs, gallops or murmurs. Lungs: clear Abdomen: obese soft, nontender, nondistended. No hepatosplenomegaly. No bruits or masses. Good bowel sounds. Extremities: no cyanosis, clubbing, rash, edema Neuro: alert & orientedx3, cranial nerves grossly intact. moves all 4 extremities w/o difficulty. Affect pleasant   ASSESSMENT & PLAN:  1. Chronic systolic HF - due to iCM - 8/20 Echo EF 20-25%  - Echo 9/22 35-40% - s/p STJ CRT-D Device interrogated in clinic. Fluid ok. No VT/AF  100% pacing - NYHA II-III - Volume status looks good. On lasix 40 daily. Take extra lasix as needed - Meds recently cut back due to low BP (SBP in 80s)  - Continue Entresto 24/26 bid (recently decreased form 97/103 bid) - Restart spiro 12.5  - Continue carvedilol 6.25 bid (recently decreased from 9.375 bid) - Continue lasix 80 daily  - Continue Farxiga 10.   2. CAD with chronic stable angin - s/p multiple stents to LAD and LCX - recent cath 9/22 stable CAD - continues with some CP. May have component of microvascular disease - Followed Dr. Raliegh Ip. - Continue Imdur and Ranexa. Can consider  cardiac rehab - Consider enrollment into [facility] microvascular cath study  3. DM2 - Continue Farxiga  4. OSA - not using CPAP - f/u with Dr. Radford Pax  5. Severe obesity - has struggled with weight loss - Discussed [employer]  6. 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 Followed by Dr. Precious Gilding, MD  9:44 AM

## 2021-03-25 NOTE — Patient Instructions (Addendum)
Start Spironolactone 12.5 mg (1/2 tab) Daily  Labs done today, your results will be available in MyChart, we will contact you for abnormal readings.  Your physician recommends that you schedule a follow-up appointment in: 9 months (July 2023), **PLEASE CALL OUR OFFICE IN MAY TO SCHEDULE THIS APPOINTMENT  If you have any questions or concerns before your next appointment please send [place] a message through [place] or call our office at [phone].    TO LEAVE A MESSAGE FOR THE NURSE SELECT OPTION 2, PLEASE LEAVE A MESSAGE INCLUDING: YOUR NAME DATE OF BIRTH CALL BACK NUMBER REASON FOR CALL**this is important as we prioritize the call backs  YOU WILL RECEIVE A CALL BACK THE SAME DAY AS LONG AS YOU CALL BEFORE 4:00 PM  At the [facility], you and your health needs are our priority. As part of our continuing mission to provide you with exceptional heart care, we have created designated Provider Care Teams. These Care Teams include your primary Cardiologist (physician) and Advanced Practice Providers (APPs- Physician Assistants and Nurse Practitioners) who all work together to provide you with the care you need, when you need it.   You may see any of the following providers on your designated Care Team at your next follow up: Dr Glori Bickers Dr Loralie Champagne Dr Patrice Paradise, NP Lyda Jester, [place] Ginnie Smart Audry Riles, PharmD   Please be sure to bring in all your medications bottles to every appointment.

## 2021-04-18 ENCOUNTER — Ambulatory Visit (INDEPENDENT_AMBULATORY_CARE_PROVIDER_SITE_OTHER): Payer: BC Managed Care – PPO

## 2021-04-18 DIAGNOSIS — I5022 Chronic systolic (congestive) heart failure: Secondary | ICD-10-CM | POA: Diagnosis not present

## 2021-04-19 LAB — CUP PACEART REMOTE DEVICE CHECK
Battery Remaining Longevity: 58 mo
Battery Remaining Percentage: 69 %
Battery Voltage: 2.98 V
Brady Statistic AP VP Percent: 4.3 %
Brady Statistic AP VS Percent: 1 %
Brady Statistic AS VP Percent: 86 %
Brady Statistic AS VS Percent: 5.9 %
Brady Statistic RA Percent Paced: 1.7 %
Date Time Interrogation Session: 20221031020020
HighPow Impedance: 81 Ohm
HighPow Impedance: 81 Ohm
Implantable Lead Implant Date: 20200930
Implantable Lead Implant Date: 20200930
Implantable Lead Implant Date: 20200930
Implantable Lead Location: 753858
Implantable Lead Location: 753859
Implantable Lead Location: 753860
Implantable Pulse Generator Implant Date: 20200930
Lead Channel Impedance Value: 1075 Ohm
Lead Channel Impedance Value: 400 Ohm
Lead Channel Impedance Value: 530 Ohm
Lead Channel Pacing Threshold Amplitude: 0.5 V
Lead Channel Pacing Threshold Amplitude: 0.75 V
Lead Channel Pacing Threshold Amplitude: 1 V
Lead Channel Pacing Threshold Pulse Width: 0.5 ms
Lead Channel Pacing Threshold Pulse Width: 0.5 ms
Lead Channel Pacing Threshold Pulse Width: 0.5 ms
Lead Channel Sensing Intrinsic Amplitude: 11.7 mV
Lead Channel Sensing Intrinsic Amplitude: 2.6 mV
Lead Channel Setting Pacing Amplitude: 2 V
Lead Channel Setting Pacing Amplitude: 2.5 V
Lead Channel Setting Pacing Amplitude: 2.5 V
Lead Channel Setting Pacing Pulse Width: 0.5 ms
Lead Channel Setting Pacing Pulse Width: 0.5 ms
Lead Channel Setting Sensing Sensitivity: 0.5 mV
Pulse Gen Serial Number: 9901124

## 2021-04-21 IMAGING — DX DG CHEST 2V
2 series · 2 of 2 positions shown · non-contrast
Comparison: 12/20/2018

CLINICAL DATA: Postop from pacemaker placement.

EXAM:
CHEST - 2 VIEW

[chest lat]
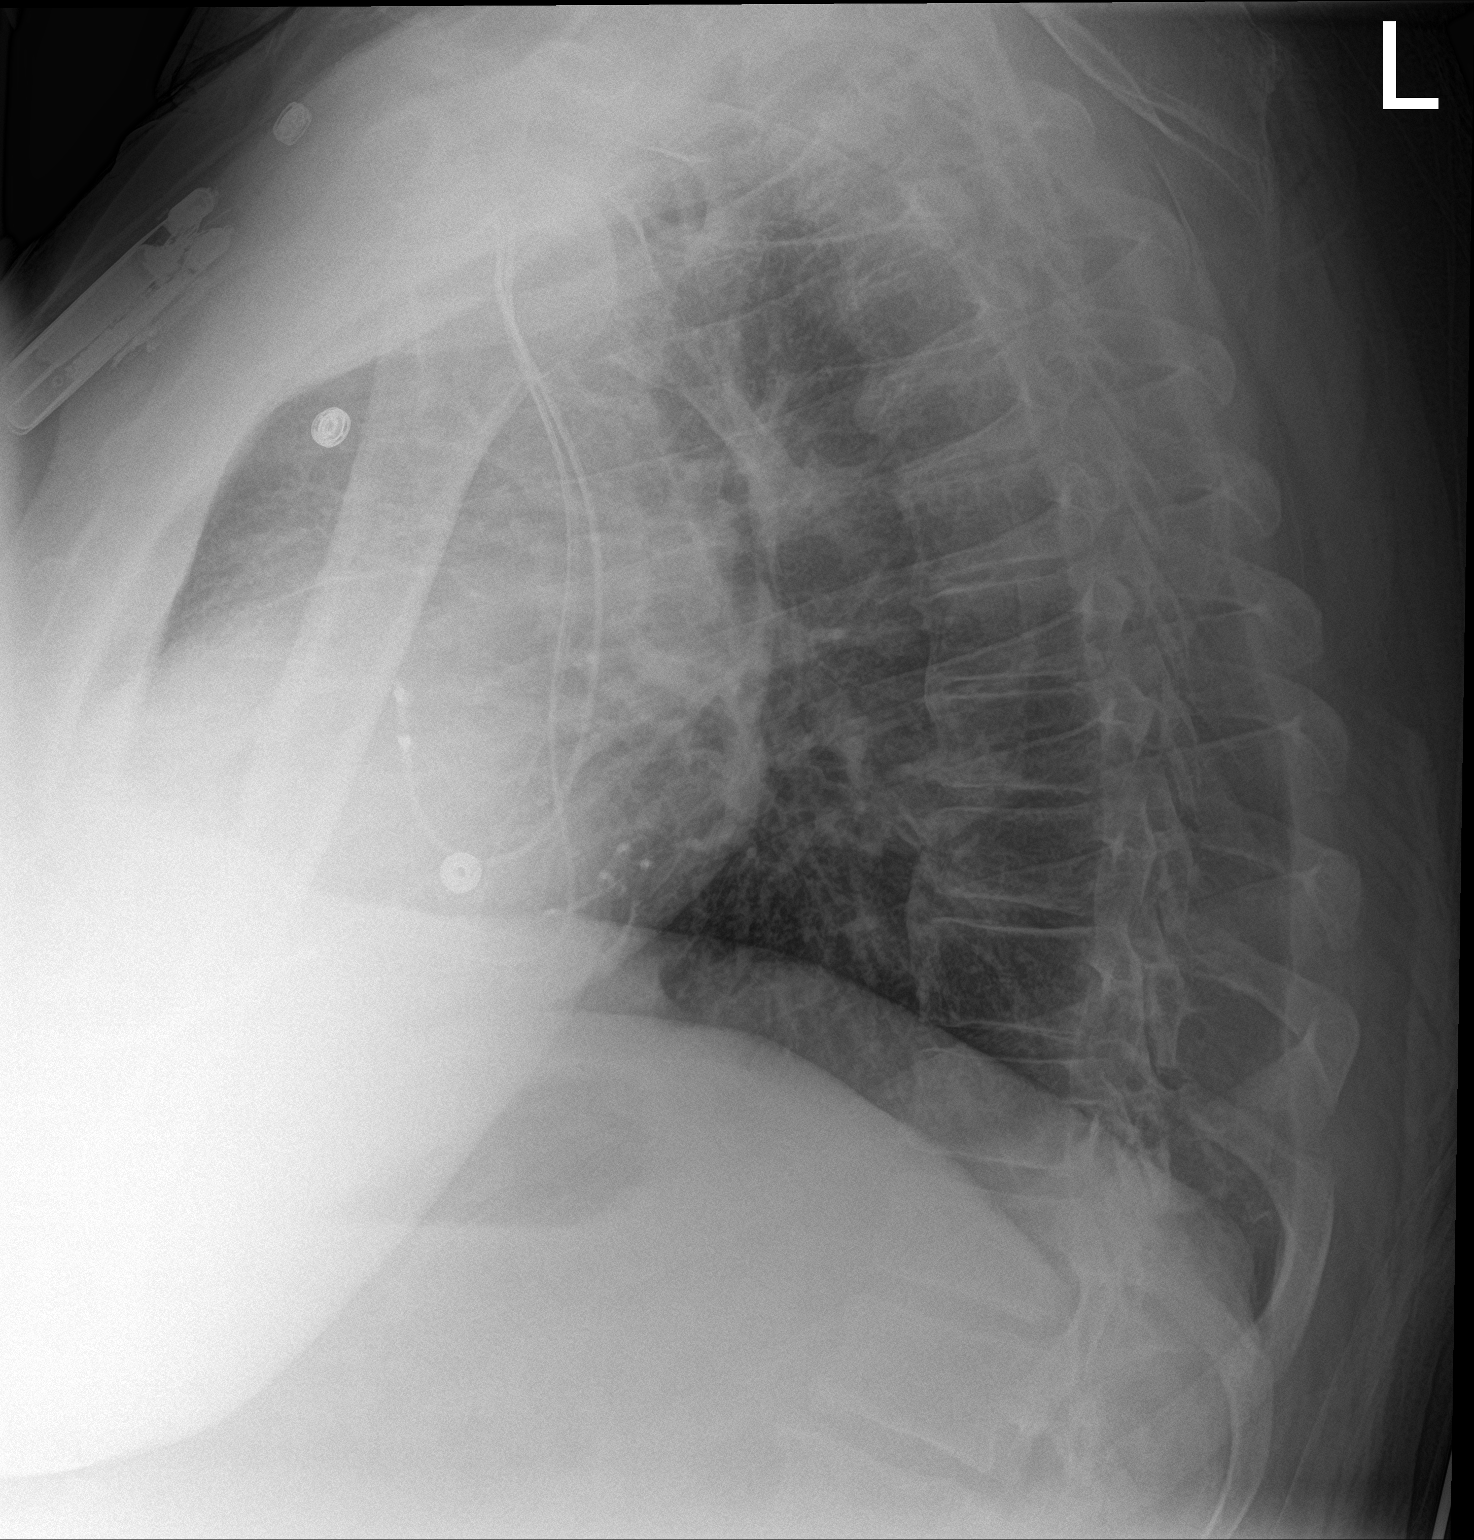

[chest ap]
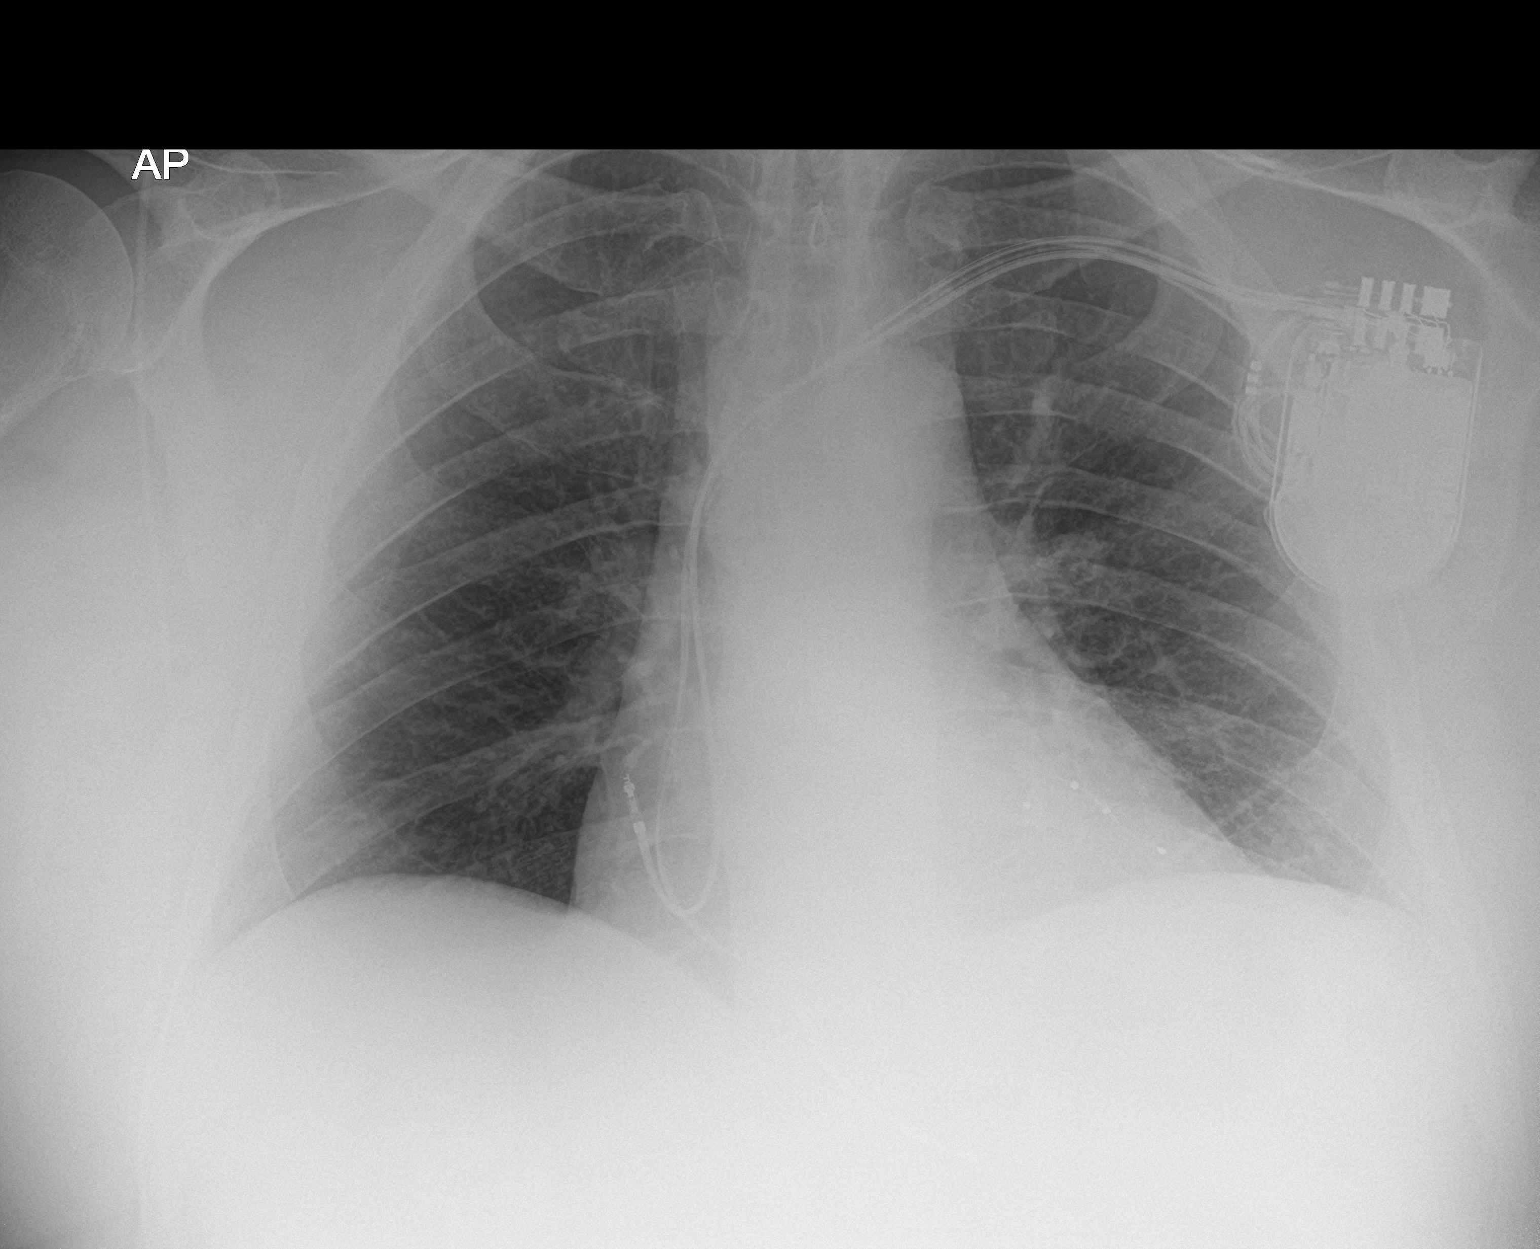

[2 of 2 positions shown; findings below may reference images not displayed]

FINDINGS: New triple lead AICD is seen with leads in appropriate position. No
evidence of pneumothorax. Both lungs are clear. Heart size is
normal.
IMPRESSION: New AICD in appropriate position. No evidence of pneumothorax or
other acute findings.

## 2021-04-25 ENCOUNTER — Telehealth: Payer: Self-pay

## 2021-04-25 NOTE — Progress Notes (Signed)
Remote ICD transmission.   

## 2021-04-28 ENCOUNTER — Other Ambulatory Visit: Payer: Self-pay | Admitting: Cardiology

## 2021-04-28 ENCOUNTER — Other Ambulatory Visit: Payer: Self-pay

## 2021-04-29 MED ORDER — MEXILETINE HCL 200 MG PO CAPS: 200.0000 mg | ORAL_CAPSULE | Freq: Two times a day (BID) | ORAL | 0 refills | Status: DC

## 2021-05-13 ENCOUNTER — Other Ambulatory Visit: Payer: Self-pay | Admitting: Cardiology

## 2021-05-25 ENCOUNTER — Encounter: Payer: Self-pay | Admitting: Cardiology

## 2021-06-27 ENCOUNTER — Other Ambulatory Visit: Payer: Self-pay

## 2021-06-27 ENCOUNTER — Ambulatory Visit: Payer: 59 | Admitting: Cardiology

## 2021-06-27 VITALS — BP 130/92 | HR 90 | Ht 76.0 in | Wt 356.2 lb

## 2021-06-27 DIAGNOSIS — I10 Essential (primary) hypertension: Secondary | ICD-10-CM

## 2021-06-27 DIAGNOSIS — I5042 Chronic combined systolic (congestive) and diastolic (congestive) heart failure: Secondary | ICD-10-CM | POA: Diagnosis not present

## 2021-06-27 DIAGNOSIS — Z9861 Coronary angioplasty status: Secondary | ICD-10-CM

## 2021-06-27 DIAGNOSIS — I2510 Atherosclerotic heart disease of native coronary artery without angina pectoris: Secondary | ICD-10-CM

## 2021-06-27 DIAGNOSIS — I5022 Chronic systolic (congestive) heart failure: Secondary | ICD-10-CM | POA: Diagnosis not present

## 2021-06-27 DIAGNOSIS — E118 Type 2 diabetes mellitus with unspecified complications: Secondary | ICD-10-CM

## 2021-06-27 NOTE — Progress Notes (Signed)
Cardiology Office Note:    Date:  06/27/2021   ID:  Joe [name], DOB [birth year], MRN [id number]  PCP:  Cyndi Bender, PA-C  Cardiologist:  Jenne Campus, MD    Referring MD: Cyndi Bender, PA-C   Chief Complaint  Patient presents with   Follow-up  Doing fine  History of Present Illness:    Joe [name] is a 51 y.o. male  with past medical history significant for coronary artery disease in March 2019 he required stenting to mid to distal LAD after that he was doing quite nicely but then started getting symptoms again did have a cardiac catheterization in January 2021 which showed 40% stenosis of distal LAD stents were patent, mid RCA had about 25% lesion PDA also got to 50% stenosis.  Circumflex was normal.  At the same time he was noted to have diminished left ventricle ejection fraction with echocardiogram showing ejection fraction 20 to 25%.  Eventually on March 19, 2019 he did receive CRT-D In January 2022 he did have a cardiac catheterization for atypical chest pain however did not show any target lesion for interventions. He comes today to my office for follow-up overall he seems to look better and feel better.  He lost few pounds since I seen him last time.  He is not able be more active.  He is applying for disability.  He looks much less stressed out right now and overall seems to be doing more optimistic.  Denies have any chest pain tightness squeezing pressure burning chest swelling of lower extremities still there.  Not much shortness of breath.  Past Medical History:  Diagnosis Date   Abnormal EKG    Acute systolic heart failure ([place]) 09/06/2017   Angina pectoris ([place]) 09/05/2017   Body mass index 45.0-49.9, adult (HCC)    CAD S/P percutaneous coronary angioplasty 09/07/2017   mLAD and dLAD PCI with DES 09/06/17   CHF (congestive heart failure) (HCC)    Chronic systolic (congestive) heart failure ([place]) 9/67/5916   Complication of anesthesia 1995   "had hard time  waking up; breathing w/vasectomy"   Coronary artery disease    Erectile dysfunction    Essential hypertension, benign    Fatigue    GERD (gastroesophageal reflux disease)    Gout    High cholesterol    "just started tx yesterday" (09/06/2017)   History of gout    "RX prn" (09/06/2017)   Ischemic cardiomyopathy 09/07/2017   EF 25-35%   Muscular chest pain    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   Below Left Nare (MOH's)   Obesity    Obstructive sleep apnea    OSA on CPAP    Plantar fasciitis    Pre-operative clearance 02/11/2018   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/02/2019   [employer]   Right bundle branch block    Shortness of breath 09/05/2017   Testosterone deficiency    Type 2 diabetes mellitus with complication, without long-term current use of insulin ([place]) 09/05/2017   Type II diabetes mellitus ([place])    "started tx 08/28/2017"    Past Surgical History:  Procedure Laterality Date   BACK SURGERY     BIV ICD INSERTION CRT-D N/A 03/19/2019   Procedure: BIV ICD INSERTION CRT-D;  Surgeon: Constance Haw, MD;  Location: [facility];  Service: Cardiovascular;  Laterality: N/A;   CORONARY ANGIOPLASTY WITH STENT PLACEMENT  09/06/2017   "3 stents"   CORONARY STENT INTERVENTION N/A 09/06/2017  Procedure: CORONARY STENT INTERVENTION;  Surgeon: Jettie Booze, MD;  Location: [facility];  Service: Cardiovascular;  Laterality: N/A;   CORONARY STENT INTERVENTION N/A 02/15/2018   Procedure: CORONARY STENT INTERVENTION;  Surgeon: Jettie Booze, MD;  Location: [facility];  Service: Cardiovascular;  Laterality: N/A;   KNEE ARTHROSCOPY Right    LEFT HEART CATH AND CORONARY ANGIOGRAPHY N/A 09/06/2017   Procedure: LEFT HEART CATH AND CORONARY ANGIOGRAPHY;  Surgeon: Jettie Booze, MD;  Location: [facility];  Service: Cardiovascular;  Laterality: N/A;   LEFT HEART CATH AND CORONARY ANGIOGRAPHY N/A  02/15/2018   Procedure: LEFT HEART CATH AND CORONARY ANGIOGRAPHY;  Surgeon: Jettie Booze, MD;  Location: [facility];  Service: Cardiovascular;  Laterality: N/A;   LEFT HEART CATH AND CORONARY ANGIOGRAPHY N/A 06/27/2018   Procedure: LEFT HEART CATH AND CORONARY ANGIOGRAPHY;  Surgeon: Jettie Booze, MD;  Location: [facility];  Service: Cardiovascular;  Laterality: N/A;   LEFT HEART CATH AND CORONARY ANGIOGRAPHY N/A 06/28/2020   Procedure: LEFT HEART CATH AND CORONARY ANGIOGRAPHY;  Surgeon: Martinique, Peter M, MD;  Location: [facility];  Service: Cardiovascular;  Laterality: N/A;   LUMBAR SPINE SURGERY  ~ 2001   Intradiscal Electrothermoplasty   RIGHT/LEFT HEART CATH AND CORONARY ANGIOGRAPHY N/A 03/07/2021   Procedure: RIGHT/LEFT HEART CATH AND CORONARY ANGIOGRAPHY;  Surgeon: Larey Dresser, MD;  Location: [facility];  Service: Cardiovascular;  Laterality: N/A;   VASECTOMY  1995    Current Medications: Current Meds  Medication Sig   acetaminophen (TYLENOL) 500 MG tablet Take 1,000 mg by mouth every 6 (six) hours as needed for moderate pain or headache.   allopurinol (ZYLOPRIM) 100 MG tablet Take 100 mg by mouth daily as needed (for gout).    aspirin EC 81 MG tablet Take 1 tablet (81 mg total) by mouth daily.   carvedilol (COREG) 6.25 MG tablet Take 1 tablet (6.25 mg total) by mouth 2 (two) times daily with a meal.   colchicine 0.6 MG tablet Take 0.6 mg by mouth daily as needed (Gout).   dapagliflozin propanediol (FARXIGA) 10 MG TABS tablet Take 1 tablet (10 mg total) by mouth daily.   escitalopram (LEXAPRO) 10 MG tablet Take 10 mg by mouth daily.   ezetimibe (ZETIA) 10 MG tablet TAKE 1 TABLET BY MOUTH EVERY DAY (Patient taking differently: Take 10 mg by mouth daily.)   furosemide (LASIX) 40 MG tablet Take 1 tablet (40 mg total) by mouth daily.   halobetasol (ULTRAVATE) 0.05 % cream Apply 1 application topically at bedtime as needed (Eczema).   ibuprofen  (ADVIL) 200 MG tablet Take 800 mg by mouth every 8 (eight) hours as needed for headache or moderate pain.   indomethacin (INDOCIN) 50 MG capsule Take 50 mg by mouth daily as needed for mild pain or moderate pain (Gout).   isosorbide mononitrate (IMDUR) 30 MG 24 hr tablet Take 1 tablet (30 mg total) by mouth daily.   metFORMIN (GLUCOPHAGE-XR) 500 MG 24 hr tablet Take 2 tablets (1,000 mg total) by mouth in the morning and at bedtime.   mexiletine (MEXITIL) 200 MG capsule Take 1 capsule (200 mg total) by mouth 2 (two) times daily. Please make overdue appt with Dr. Curt Bears before anymore refills. Thank you 3rd and Final Attempt   nitroGLYCERIN (NITROSTAT) 0.4 MG SL tablet Place 1 tablet (0.4 mg total) under the tongue every 5 (five) minutes as needed for chest  pain.   omeprazole (PRILOSEC) 40 MG capsule Take 1 capsule (40 mg total) by mouth daily.   Polyethylene Glycol 400 (BLINK TEARS OP) Place 2 drops into both eyes 2 (two) times daily as needed (for dry eyes).   potassium chloride SA (KLOR-CON) 20 MEQ tablet Take 20 mEq by mouth daily.   ranolazine (RANEXA) 500 MG 12 hr tablet Take 1 tablet (500 mg total) by mouth 2 (two) times daily.   rosuvastatin (CRESTOR) 40 MG tablet Take 40 mg by mouth every evening.   spironolactone (ALDACTONE) 25 MG tablet Take 0.5 tablets (12.5 mg total) by mouth daily.     Allergies:   Patient has no known allergies.   Social History   Socioeconomic History   Marital status: Married    Spouse name: Not on file   Number of children: Not on file   Years of education: Not on file   Highest education level: Not on file  Occupational History   Not on file  Tobacco Use   Smoking status: Never   Smokeless tobacco: Never  Vaping Use   Vaping Use: Never used  Substance and Sexual Activity   Alcohol use: No   Drug use: Never   Sexual activity: Not on file  Other Topics Concern   Not on file  Social History Narrative   Not on file   Social Determinants of Health    Financial Resource Strain: Not on file  Food Insecurity: Not on file  Transportation Needs: Not on file  Physical Activity: Not on file  Stress: Not on file  Social Connections: Not on file     Family History: The patient's family history includes Brain cancer in his mother; Diabetes Mellitus II in his brother and paternal grandfather; Lung cancer in his mother; Lymphoma in his father. ROS:   Please see the history of present illness.    All 14 point review of systems negative except as described per history of present illness  EKGs/Labs/Other Studies Reviewed:      Recent Labs: 08/06/2020: Magnesium 2.5 11/11/2020: TSH 2.980 01/28/2021: ALT 24; NT-Pro BNP 26 03/07/2021: Hemoglobin 13.9; Hemoglobin 13.9; Platelets 225 03/25/2021: B Natriuretic Peptide 11.8; BUN 10; Creatinine, Ser 0.97; Potassium 4.1; Sodium 137  Recent Lipid Panel    Component Value Date/Time   CHOL 103 03/21/2021 0923   TRIG 247 (H) 03/21/2021 0923   HDL 31 (L) 03/21/2021 0923   CHOLHDL 3.3 03/21/2021 0923   LDLCALC 34 03/21/2021 0923    Physical Exam:    VS:  BP (!) 130/92 (BP Location: Right Arm, Patient Position: Sitting)    Pulse 90    Ht 6\' 4"  (1.93 m)    Wt (!) 356 lb 3.2 oz (161.6 kg)    SpO2 93%    BMI 43.36 kg/m     Wt Readings from Last 3 Encounters:  06/27/21 (!) 356 lb 3.2 oz (161.6 kg)  03/25/21 (!) 365 lb 9.6 oz (165.8 kg)  03/21/21 (!) 362 lb 1.9 oz (164.3 kg)     GEN:  Well nourished, well developed in no acute distress HEENT: Normal NECK: No JVD; No carotid bruits LYMPHATICS: No lymphadenopathy CARDIAC: RRR, no murmurs, no rubs, no gallops RESPIRATORY:  Clear to auscultation without rales, wheezing or rhonchi  ABDOMEN: Soft, non-tender, non-distended MUSCULOSKELETAL:  No edema; No deformity  SKIN: Warm and dry LOWER EXTREMITIES: no swelling NEUROLOGIC:  Alert and oriented x 3 PSYCHIATRIC:  Normal affect   ASSESSMENT:    1. CAD S/P  percutaneous coronary angioplasty   2.  Chronic systolic (congestive) heart failure ([place])   3. Essential hypertension, benign   4. Chronic combined systolic and diastolic congestive heart failure ([place])   5. Type 2 diabetes mellitus with complication, without long-term current use of insulin (HCC)    PLAN:    In order of problems listed above:  Coronary artery disease stable from that point review on appropriate medication last cardiac catheterization done over the last few months showed no target lesions for intervention. Cardiomyopathy which is ischemic in origin with last estimation of ejection fraction 35 to 40%.  On appropriate medications which I will continue he is on Coreg as well as Entresto the dose of which has been decreased recently because of him not feeling well.  I will check his Chem-7 today.  He is on Aldactone which I will continue again Chem-7 will be checked today. Dyslipidemia I did review his K PN which show me his LDL 34 HDL 31 this is from 03/22/2011.  Looks good. ICD present, this is about device.  Normal function without any arrhythmia detected.  We will continue monitoring.  I did review interrogation.   Medication Adjustments/Labs and Tests Ordered: Current medicines are reviewed at length with the patient today.  Concerns regarding medicines are outlined above.  No orders of the defined types were placed in this encounter.  Medication changes: No orders of the defined types were placed in this encounter.   Signed, Park Liter, MD, [facility] 06/27/2021 8:50 AM    [place]

## 2021-06-27 NOTE — Patient Instructions (Signed)

## 2021-06-28 LAB — BASIC METABOLIC PANEL
BUN/Creatinine Ratio: 10 (ref 9–20)
BUN: 10 mg/dL (ref 6–24)
CO2: 23 mmol/L (ref 20–29)
Calcium: 9 mg/dL (ref 8.7–10.2)
Chloride: 98 mmol/L (ref 96–106)
Creatinine, Ser: 0.98 mg/dL (ref 0.76–1.27)
Glucose: 283 mg/dL — ABNORMAL HIGH (ref 70–99)
Potassium: 4.3 mmol/L (ref 3.5–5.2)
Sodium: 135 mmol/L (ref 134–144)
eGFR: 94 mL/min/{1.73_m2} (ref >59)

## 2021-07-14 ENCOUNTER — Encounter: Payer: Self-pay | Admitting: Cardiology

## 2021-07-15 ENCOUNTER — Telehealth: Payer: Self-pay | Admitting: Cardiology

## 2021-07-15 MED ORDER — RANOLAZINE ER 500 MG PO TB12: 500.0000 mg | ORAL_TABLET | Freq: Two times a day (BID) | ORAL | 3 refills | Status: DC

## 2021-07-15 MED ORDER — ROSUVASTATIN CALCIUM 40 MG PO TABS: 40.0000 mg | ORAL_TABLET | Freq: Every evening | ORAL | 3 refills | Status: DC

## 2021-07-15 NOTE — Telephone Encounter (Signed)
° °  Pt is calling to f/u his mychart message, he said he needs help with his short term disability, he said its a little urgent since he has not gotten paid for 3 weeks now

## 2021-07-15 NOTE — Telephone Encounter (Signed)
The patient called in asking for his out of work note to be changed. Instead of "further notice" it needs to say an actual date. His next appointment is 4/19. He would like this today if possible since he cannot get paid disability until it is done.

## 2021-07-15 NOTE — Telephone Encounter (Signed)
° ° °*  STAT* If patient is at the pharmacy, call can be transferred to refill team.   1. Which medications need to be refilled? (please list name of each medication and dose if known)   ranolazine (RANEXA) 500 MG 12 hr tablet  rosuvastatin (CRESTOR) 40 MG tablet  2. Which pharmacy/location (including street and city if local pharmacy) is medication to be sent to? [id number] [place], [place]  3. Do they need a 30 day or 90 day supply? 90 days  Pt is out of meds

## 2021-07-15 NOTE — Telephone Encounter (Signed)
Pt aware that Dr. Agustin Cree has been seening pts and covering the hospital and it would be next week before anything could be done, Pt verbalized understanding and had no additional questions.

## 2021-07-15 NOTE — Telephone Encounter (Signed)
Refill for Crestor 40 mg and Ranexa 500 mg sent to [facility].

## 2021-07-18 ENCOUNTER — Ambulatory Visit (INDEPENDENT_AMBULATORY_CARE_PROVIDER_SITE_OTHER): Payer: BC Managed Care – PPO

## 2021-07-18 DIAGNOSIS — I255 Ischemic cardiomyopathy: Secondary | ICD-10-CM

## 2021-07-18 LAB — CUP PACEART REMOTE DEVICE CHECK
Battery Remaining Longevity: 56 mo
Battery Remaining Percentage: 66 %
Battery Voltage: 2.96 V
Brady Statistic AP VP Percent: 4.1 %
Brady Statistic AP VS Percent: 1 %
Brady Statistic AS VP Percent: 87 %
Brady Statistic AS VS Percent: 5.5 %
Brady Statistic RA Percent Paced: 1.8 %
Date Time Interrogation Session: 20230130033216
HighPow Impedance: 82 Ohm
HighPow Impedance: 82 Ohm
Implantable Lead Implant Date: 20200930
Implantable Lead Implant Date: 20200930
Implantable Lead Implant Date: 20200930
Implantable Lead Location: 753858
Implantable Lead Location: 753859
Implantable Lead Location: 753860
Implantable Pulse Generator Implant Date: 20200930
Lead Channel Impedance Value: 1100 Ohm
Lead Channel Impedance Value: 410 Ohm
Lead Channel Impedance Value: 550 Ohm
Lead Channel Pacing Threshold Amplitude: 0.5 V
Lead Channel Pacing Threshold Amplitude: 0.75 V
Lead Channel Pacing Threshold Amplitude: 1 V
Lead Channel Pacing Threshold Pulse Width: 0.5 ms
Lead Channel Pacing Threshold Pulse Width: 0.5 ms
Lead Channel Pacing Threshold Pulse Width: 0.5 ms
Lead Channel Sensing Intrinsic Amplitude: 12 mV
Lead Channel Sensing Intrinsic Amplitude: 2.5 mV
Lead Channel Setting Pacing Amplitude: 2 V
Lead Channel Setting Pacing Amplitude: 2.5 V
Lead Channel Setting Pacing Amplitude: 2.5 V
Lead Channel Setting Pacing Pulse Width: 0.5 ms
Lead Channel Setting Pacing Pulse Width: 0.5 ms
Lead Channel Setting Sensing Sensitivity: 0.5 mV
Pulse Gen Serial Number: 9901124

## 2021-07-22 NOTE — Progress Notes (Signed)
Patient aware letter corrected and ready.

## 2021-07-26 ENCOUNTER — Telehealth: Payer: Self-pay | Admitting: Cardiology

## 2021-07-26 NOTE — Telephone Encounter (Signed)
Forms from [employer] received on 07/26/21. Completed auth attached. Took form to MD box for completion.  Cash received $29 dollars, placed in safe.  [employer] 07/26/2021

## 2021-07-26 NOTE — Progress Notes (Signed)
Remote ICD transmission.   

## 2021-07-29 NOTE — Telephone Encounter (Signed)
Patient did call back, I spoke with Joe [name], he denies any obvious chest discomfort or worsening dyspnea since the last visit.  He should be cleared from the medical perspective.  Awaiting Dr. Wendy Poet recommendation regarding aspirin.

## 2021-07-29 NOTE — Telephone Encounter (Signed)
Left a message for the patient to call back and speak to the on-call preop APP of the day.  Dr. Agustin Cree, please review, Joe [name] had multiple cardiac catheterizations in the past.  Last PCI was in August 2019.  Since then, he has underwent cardiac catheterization in January 2020, January 2022 and lastly 03/07/2021, all of which showed patent stents, medical therapy were recommended.  As long as he denies any chest pain, would you be okay with him holding aspirin prior to upcoming GI procedure?

## 2021-07-29 NOTE — Telephone Encounter (Signed)
° °  [facility] Pre-operative Risk Assessment    Request for surgical clearance:  What type of surgery is being performed? Colonoscopy   When is this surgery scheduled? TBD   What type of clearance is required (medical clearance vs. Pharmacy clearance to hold med vs. Both)? Pharmacy  Are there any medications that need to be held prior to surgery and how long?Aspirin, holding length not specified    Practice name and name of physician performing surgery? Dr. Nehemiah Settle  at [facility]   What is your office phone number: [phone]    7.   What is your office fax number: [phone]  8.   Anesthesia type (None, local, MAC, general) ? Not specified   Basil Dess Captain Blucher 07/29/2021, 1:12 PM  _________________________________________________________________   (provider comments below) --

## 2021-07-29 NOTE — Telephone Encounter (Signed)
° °  Name: Joe [name]  DOB: 12-12-1970  MRN: [id number]   Primary Cardiologist: Jenne Campus, MD  Chart reviewed as part of pre-operative protocol coverage. Patient was contacted 07/29/2021 in reference to pre-operative risk assessment for pending surgery as outlined below.  Argenis Guerrette was last seen on 06/27/2021 by Dr. Agustin Cree.  Since that day, Hans Rusher has done well without exertional chest pain or worsening dyspnea.  Therefore, based on ACC/AHA guidelines, the patient would be at acceptable risk for the planned procedure without further cardiovascular testing.   Awaiting Dr. Wendy Poet recommendation regarding aspirin  The patient was advised that if he develops new symptoms prior to surgery to contact our office to arrange for a follow-up visit, and he verbalized understanding.  I will route this recommendation to the requesting party via Epic fax function and remove from pre-op pool. Please call with questions.  [place], [place] 07/29/2021, 2:51 PM

## 2021-07-31 NOTE — Telephone Encounter (Signed)
Per Dr. Agustin Cree, "Should be fine to hold asa for few days before colonoscopy "  Callback pool to inform the patient to hold aspirin for 5 days prior to upcoming colonoscopy.   I will forward this cardiac clearance letter back to GI doctor's office.

## 2021-08-01 NOTE — Telephone Encounter (Signed)
Patient notified and voiced understanding.

## 2021-09-08 ENCOUNTER — Telehealth: Payer: Self-pay

## 2021-09-08 NOTE — Telephone Encounter (Signed)
I am trying to complete the paperwork for his disability forms but I am not sure what task he is unable to complete or why he cannot complete them. Could we refer him to weight loss management and revaluate after losing weight? You last not states that after he lost some weight he felt better? No notes address why the pt can't work only that he is tired an fatigued. ? ?06/27/21 He comes today to my office for follow-up overall he seems to look better and feel better.  He lost few pounds since I seen him last time.  He is not able be more active.  He is applying for disability.  He looks much less stressed out right now and overall seems to be doing more optimistic.  Denies have any chest pain tightness squeezing pressure burning chest swelling of lower extremities still there.  Not much shortness of breath. 356 lb ? ?03/21/21  saw him couple weeks ago and he being sent to the emergency room done he is complaining where not feeling well some chest pain.  Cardiac catheterization was done in the hospital which showed no target lesion for intervention luckily his ejection fraction seems to be improving about 35 to 40%.  He comes today 2 months for follow-up still complain of being weak tired and exhausted actually is taking about retiring/disability.  Denies have any chest pain tightness squeezing pressure burning chest just fatigue and shortness of breath. 362 lb ? 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 ? ?Please advise ? ? ?  ?

## 2021-09-19 ENCOUNTER — Other Ambulatory Visit: Payer: Self-pay | Admitting: Cardiology

## 2021-10-05 ENCOUNTER — Ambulatory Visit: Payer: 59 | Admitting: Cardiology

## 2021-10-05 ENCOUNTER — Encounter: Payer: Self-pay | Admitting: Cardiology

## 2021-10-05 VITALS — BP 148/96 | HR 98 | Ht 75.0 in | Wt 354.6 lb

## 2021-10-05 DIAGNOSIS — I255 Ischemic cardiomyopathy: Secondary | ICD-10-CM | POA: Diagnosis not present

## 2021-10-05 DIAGNOSIS — Z9861 Coronary angioplasty status: Secondary | ICD-10-CM

## 2021-10-05 DIAGNOSIS — Z95810 Presence of automatic (implantable) cardiac defibrillator: Secondary | ICD-10-CM

## 2021-10-05 DIAGNOSIS — I2510 Atherosclerotic heart disease of native coronary artery without angina pectoris: Secondary | ICD-10-CM

## 2021-10-05 DIAGNOSIS — I5022 Chronic systolic (congestive) heart failure: Secondary | ICD-10-CM

## 2021-10-05 MED ORDER — MEXILETINE HCL 200 MG PO CAPS: 200.0000 mg | ORAL_CAPSULE | Freq: Two times a day (BID) | ORAL | 3 refills | Status: DC

## 2021-10-05 MED ORDER — ISOSORBIDE MONONITRATE ER 30 MG PO TB24: 30.0000 mg | ORAL_TABLET | Freq: Every day | ORAL | 3 refills | Status: DC

## 2021-10-05 MED ORDER — POTASSIUM CHLORIDE CRYS ER 20 MEQ PO TBCR: 20.0000 meq | EXTENDED_RELEASE_TABLET | Freq: Every day | ORAL | 3 refills | Status: DC

## 2021-10-05 NOTE — Patient Instructions (Signed)
Medication Instructions:  ?Your physician recommends that you continue on your current medications as directed. Please refer to the Current Medication list given to you today. ? ?*If you need a refill on your cardiac medications before your next appointment, please call your pharmacy* ? ? ?Lab Work: ?Your physician recommends that you have a BMET today in the office. ? ?If you have labs (blood work) drawn today and your tests are completely normal, you will receive your results only by: ?MyChart Message (if you have MyChart) OR ?A paper copy in the mail ?If you have any lab test that is abnormal or we need to change your treatment, we will call you to review the results. ? ? ?Testing/Procedures: ?None ordered ? ? ?Follow-Up: ?At [facility], you and your health needs are our priority.  As part of our continuing mission to provide you with exceptional heart care, we have created designated Provider Care Teams.  These Care Teams include your primary Cardiologist (physician) and Advanced Practice Providers (APPs -  Physician Assistants and Nurse Practitioners) who all work together to provide you with the care you need, when you need it. ? ?We recommend signing up for the patient portal called "MyChart".  Sign up information is provided on this After Visit Summary.  MyChart is used to connect with patients for Virtual Visits (Telemedicine).  Patients are able to view lab/test results, encounter notes, upcoming appointments, etc.  Non-urgent messages can be sent to your provider as well.   ?To learn more about what you can do with MyChart, go to [email or web address].   ? ?Your next appointment:   ?4 month(s) ? ?The format for your next appointment:   ?In Person ? ?Provider:   ?Jenne Campus, MD ? ? ?Other Instructions ?NA  ?

## 2021-10-05 NOTE — Progress Notes (Signed)
?Cardiology Office Note:   ? ?Date:  10/05/2021  ? ?ID:  Joe [name], DOB [birth year], MRN [id number] ? ?PCP:  Cyndi Bender, PA-C  ?Cardiologist:  Jenne Campus, MD   ? ?Referring MD: Cyndi Bender, PA-C  ? ?No chief complaint on file. ? ? ?History of Present Illness:   ? ?Joe [name] is a 51 y.o. male   with past medical history significant for coronary artery disease in March 2019 he required stenting to mid to distal LAD after that he was doing quite nicely but then started getting symptoms again did have a cardiac catheterization in January 2021 which showed 40% stenosis of distal LAD stents were patent, mid RCA had about 25% lesion PDA also got to 50% stenosis.  Circumflex was normal.  At the same time he was noted to have diminished left ventricle ejection fraction with echocardiogram showing ejection fraction 20 to 25%.  Eventually on March 19, 2019 he did receive CRT-D.  After that echocardiogram performed showed ejection fraction 35 to 40% ?In January 2022 he did have a cardiac catheterization for atypical chest pain however did not show any target lesion for interventions. ?He is coming today to my office for follow-up still complaining of being profoundly exhausted tired cannot function well.  Does have some shortness of breath denies have any swelling of lower extremities there is no palpitations dizziness. ? ? ?Past Medical History:  ?Diagnosis Date  ? Abnormal EKG   ? Acute systolic heart failure ([place]) 09/06/2017  ? Angina pectoris ([place]) 09/05/2017  ? Body mass index 45.0-49.9, adult ([place])   ? CAD S/P percutaneous coronary angioplasty 09/07/2017  ? mLAD and dLAD PCI with DES 09/06/17  ? CHF (congestive heart failure) ([place])   ? Chronic systolic (congestive) heart failure ([place]) 03/19/2019  ? Complication of anesthesia 1995  ? "had hard time waking up; breathing w/vasectomy"  ? Coronary artery disease   ? Erectile dysfunction   ? Essential hypertension, benign   ? Fatigue   ? GERD (gastroesophageal  reflux disease)   ? Gout   ? High cholesterol   ? "just started tx yesterday" (09/06/2017)  ? History of gout   ? "RX prn" (09/06/2017)  ? Ischemic cardiomyopathy 09/07/2017  ? EF 25-35%  ? Muscular chest pain   ? 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  ? Below Left Nare (MOH's)  ? Obesity   ? Obstructive sleep apnea   ? OSA on CPAP   ? Plantar fasciitis   ? Pre-operative clearance 02/11/2018  ? 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/02/2019  ? [employer]  ? Right bundle branch block   ? Shortness of breath 09/05/2017  ? Testosterone deficiency   ? Type 2 diabetes mellitus with complication, without long-term current use of insulin ([place]) 09/05/2017  ? Type II diabetes mellitus ([place])   ? "started tx 08/28/2017"  ? ? ?Past Surgical History:  ?Procedure Laterality Date  ? BACK SURGERY    ? BIV ICD INSERTION CRT-D N/A 03/19/2019  ? Procedure: BIV ICD INSERTION CRT-D;  Surgeon: Constance Haw, MD;  Location: [facility];  Service: Cardiovascular;  Laterality: N/A;  ? CORONARY ANGIOPLASTY WITH STENT PLACEMENT  09/06/2017  ? "3 stents"  ? CORONARY STENT INTERVENTION N/A 09/06/2017  ? Procedure: CORONARY STENT INTERVENTION;  Surgeon: Jettie Booze, MD;  Location: [facility];  Service: Cardiovascular;  Laterality: N/A;  ? CORONARY STENT INTERVENTION N/A 02/15/2018  ? Procedure: CORONARY STENT INTERVENTION;  Surgeon:  Jettie Booze, MD;  Location: [facility];  Service: Cardiovascular;  Laterality: N/A;  ? KNEE ARTHROSCOPY Right   ? LEFT HEART CATH AND CORONARY ANGIOGRAPHY N/A 09/06/2017  ? Procedure: LEFT HEART CATH AND CORONARY ANGIOGRAPHY;  Surgeon: Jettie Booze, MD;  Location: [facility];  Service: Cardiovascular;  Laterality: N/A;  ? LEFT HEART CATH AND CORONARY ANGIOGRAPHY N/A 02/15/2018  ? Procedure: LEFT HEART CATH AND CORONARY ANGIOGRAPHY;  Surgeon: Jettie Booze, MD;  Location: [facility];  Service: Cardiovascular;   Laterality: N/A;  ? LEFT HEART CATH AND CORONARY ANGIOGRAPHY N/A 06/27/2018  ? Procedure: LEFT HEART CATH AND CORONARY ANGIOGRAPHY;  Surgeon: Jettie Booze, MD;  Location: [facility];  Service: Cardiovascular;  Laterality: N/A;  ? LEFT HEART CATH AND CORONARY ANGIOGRAPHY N/A 06/28/2020  ? Procedure: LEFT HEART CATH AND CORONARY ANGIOGRAPHY;  Surgeon: Martinique, Peter M, MD;  Location: [facility];  Service: Cardiovascular;  Laterality: N/A;  ? LUMBAR SPINE SURGERY  ~ 2001  ? Intradiscal Electrothermoplasty  ? RIGHT/LEFT HEART CATH AND CORONARY ANGIOGRAPHY N/A 03/07/2021  ? Procedure: RIGHT/LEFT HEART CATH AND CORONARY ANGIOGRAPHY;  Surgeon: Larey Dresser, MD;  Location: [facility];  Service: Cardiovascular;  Laterality: N/A;  ? VASECTOMY  1995  ? ? ?Current Medications: ?Current Meds  ?Medication Sig  ? acetaminophen (TYLENOL) 500 MG tablet Take 1,000 mg by mouth every 6 (six) hours as needed for moderate pain or headache.  ? allopurinol (ZYLOPRIM) 100 MG tablet Take 100 mg by mouth daily as needed (for gout).   ? aspirin EC 81 MG tablet Take 1 tablet (81 mg total) by mouth daily.  ? carvedilol (COREG) 6.25 MG tablet Take 6.25 mg by mouth 2 (two) times daily with a meal.  ? colchicine 0.6 MG tablet Take 0.6 mg by mouth daily as needed (Gout).  ? dapagliflozin propanediol (FARXIGA) 10 MG TABS tablet Take 1 tablet (10 mg total) by mouth daily.  ? escitalopram (LEXAPRO) 10 MG tablet Take 10 mg by mouth daily.  ? ezetimibe (ZETIA) 10 MG tablet TAKE 1 TABLET BY MOUTH EVERY DAY (Patient taking differently: Take 10 mg by mouth daily.)  ? furosemide (LASIX) 40 MG tablet Take 1 tablet (40 mg total) by mouth daily.  ? halobetasol (ULTRAVATE) 0.05 % cream Apply 1 application topically at bedtime as needed (Eczema).  ? ibuprofen (ADVIL) 200 MG tablet Take 800 mg by mouth every 8 (eight) hours as needed for headache or moderate pain.  ? indomethacin (INDOCIN) 50 MG capsule Take 50 mg by mouth daily as  needed for mild pain or moderate pain (Gout).  ? isosorbide mononitrate (IMDUR) 30 MG 24 hr tablet Take 30 mg by mouth daily.  ? metFORMIN (GLUCOPHAGE-XR) 500 MG 24 hr tablet Take 2 tablets (1,000 mg total) by mouth in the morning and at bedtime.  ? mexiletine (MEXITIL) 200 MG capsule Take 1 capsule (200 mg total) by mouth 2 (two) times daily. Please make overdue appt with Dr. Curt Bears before anymore refills. Thank you 3rd and Final Attempt  ? nitroGLYCERIN (NITROSTAT) 0.4 MG SL tablet Place 0.4 mg under the tongue every 5 (five) minutes as needed for chest pain.  ? omeprazole (PRILOSEC) 40 MG capsule Take 1 capsule (40 mg total) by mouth daily.  ? Polyethylene Glycol 400 (BLINK TEARS OP) Place 2 drops into both eyes 2 (two) times daily as needed (for dry eyes).  ? potassium chloride SA (KLOR-CON)  20 MEQ tablet Take 20 mEq by mouth daily.  ? ranolazine (RANEXA) 500 MG 12 hr tablet Take 500 mg by mouth 2 (two) times daily.  ? rosuvastatin (CRESTOR) 40 MG tablet Take 1 tablet (40 mg total) by mouth every evening.  ? spironolactone (ALDACTONE) 25 MG tablet Take 0.5 tablets (12.5 mg total) by mouth daily.  ?  ? ?Allergies:   Patient has no known allergies.  ? ?Social History  ? ?Socioeconomic History  ? Marital status: Married  ?  Spouse name: Not on file  ? Number of children: Not on file  ? Years of education: Not on file  ? Highest education level: Not on file  ?Occupational History  ? Not on file  ?Tobacco Use  ? Smoking status: Never  ? Smokeless tobacco: Never  ?Vaping Use  ? Vaping Use: Never used  ?Substance and Sexual Activity  ? Alcohol use: No  ? Drug use: Never  ? Sexual activity: Not on file  ?Other Topics Concern  ? Not on file  ?Social History Narrative  ? Not on file  ? ?Social Determinants of Health  ? ?Financial Resource Strain: Not on file  ?Food Insecurity: Not on file  ?Transportation Needs: Not on file  ?Physical Activity: Not on file  ?Stress: Not on file  ?Social Connections: Not on file  ?   ? ?Family History: ?The patient's family history includes Brain cancer in his mother; Diabetes Mellitus II in his brother and paternal grandfather; Lung cancer in his mother; Lymphoma in his father. ?ROS:

## 2021-10-06 LAB — BASIC METABOLIC PANEL
BUN/Creatinine Ratio: 8 — ABNORMAL LOW (ref 9–20)
BUN: 8 mg/dL (ref 6–24)
CO2: 22 mmol/L (ref 20–29)
Calcium: 9 mg/dL (ref 8.7–10.2)
Chloride: 97 mmol/L (ref 96–106)
Creatinine, Ser: 0.96 mg/dL (ref 0.76–1.27)
Glucose: 413 mg/dL — ABNORMAL HIGH (ref 70–99)
Potassium: 4.3 mmol/L (ref 3.5–5.2)
Sodium: 134 mmol/L (ref 134–144)
eGFR: 96 mL/min/{1.73_m2} (ref >59)

## 2021-10-11 ENCOUNTER — Encounter: Payer: Self-pay | Admitting: Cardiology

## 2021-10-11 NOTE — Telephone Encounter (Signed)
Called patient to let him know he can come by anytime and pick up the 29 dollars from the short term disability fee that was previously collected. Pt verbalizes understanding and will be by this week to pick it up/kbl 10/11/21 ?

## 2021-10-17 ENCOUNTER — Ambulatory Visit (INDEPENDENT_AMBULATORY_CARE_PROVIDER_SITE_OTHER): Payer: BC Managed Care – PPO

## 2021-10-17 DIAGNOSIS — I5022 Chronic systolic (congestive) heart failure: Secondary | ICD-10-CM

## 2021-10-20 LAB — CUP PACEART REMOTE DEVICE CHECK
Battery Remaining Longevity: 52 mo
Battery Remaining Percentage: 63 %
Battery Voltage: 2.96 V
Brady Statistic AP VP Percent: 3.6 %
Brady Statistic AP VS Percent: 1 %
Brady Statistic AS VP Percent: 88 %
Brady Statistic AS VS Percent: 5.2 %
Brady Statistic RA Percent Paced: 1.6 %
Date Time Interrogation Session: 20230504070457
HighPow Impedance: 80 Ohm
HighPow Impedance: 80 Ohm
Implantable Lead Implant Date: 20200930
Implantable Lead Implant Date: 20200930
Implantable Lead Implant Date: 20200930
Implantable Lead Location: 753858
Implantable Lead Location: 753859
Implantable Lead Location: 753860
Implantable Pulse Generator Implant Date: 20200930
Lead Channel Impedance Value: 1000 Ohm
Lead Channel Impedance Value: 390 Ohm
Lead Channel Impedance Value: 490 Ohm
Lead Channel Pacing Threshold Amplitude: 0.5 V
Lead Channel Pacing Threshold Amplitude: 0.75 V
Lead Channel Pacing Threshold Amplitude: 1 V
Lead Channel Pacing Threshold Pulse Width: 0.5 ms
Lead Channel Pacing Threshold Pulse Width: 0.5 ms
Lead Channel Pacing Threshold Pulse Width: 0.5 ms
Lead Channel Sensing Intrinsic Amplitude: 11.7 mV
Lead Channel Sensing Intrinsic Amplitude: 3.1 mV
Lead Channel Setting Pacing Amplitude: 2 V
Lead Channel Setting Pacing Amplitude: 2.5 V
Lead Channel Setting Pacing Amplitude: 2.5 V
Lead Channel Setting Pacing Pulse Width: 0.5 ms
Lead Channel Setting Pacing Pulse Width: 0.5 ms
Lead Channel Setting Sensing Sensitivity: 0.5 mV
Pulse Gen Serial Number: 9901124

## 2021-10-31 NOTE — Progress Notes (Addendum)
Remote ICD transmission.   

## 2021-11-02 ENCOUNTER — Telehealth: Payer: Self-pay | Admitting: *Deleted

## 2021-11-02 ENCOUNTER — Other Ambulatory Visit: Payer: Self-pay | Admitting: Cardiology

## 2021-11-02 MED ORDER — CARVEDILOL 12.5 MG PO TABS: 12.5000 mg | ORAL_TABLET | Freq: Two times a day (BID) | ORAL | 2 refills | Status: DC

## 2021-11-02 NOTE — Telephone Encounter (Signed)
-----   Message from Will Meredith Leeds, MD sent at 10/25/2021  7:36 PM EDT ----- ?Abnormal device interrogation reviewed.  Lead parameters and battery status stable.  Vt noted. Increase coreg to 12.5 mg BID. ?

## 2021-11-02 NOTE — Telephone Encounter (Signed)
Informed patient of results and verbal understanding expressed. ?Will update medication list but not send in Rx at this time.  Pt will see how he responds on increased dose.  Pt did have some low BPs earlier last year and Coreg was decrease.  Currently BP running  normal and will increase medication and see how he responds. ?He will let us know if he experiences low BPs or other SE after increasing this. ? ?

## 2021-11-27 ENCOUNTER — Encounter (HOSPITAL_BASED_OUTPATIENT_CLINIC_OR_DEPARTMENT_OTHER): Payer: Self-pay | Admitting: Emergency Medicine

## 2021-11-27 ENCOUNTER — Observation Stay (HOSPITAL_BASED_OUTPATIENT_CLINIC_OR_DEPARTMENT_OTHER)
Admission: EM | Admit: 2021-11-27 | Discharge: 2021-11-29 | Disposition: A | Discharge status: Home / Self Care | Payer: 59 | Attending: Cardiovascular Disease | Admitting: Cardiovascular Disease

## 2021-11-27 ENCOUNTER — Other Ambulatory Visit: Payer: Self-pay

## 2021-11-27 ENCOUNTER — Emergency Department (HOSPITAL_BASED_OUTPATIENT_CLINIC_OR_DEPARTMENT_OTHER): Payer: 59

## 2021-11-27 DIAGNOSIS — R778 Other specified abnormalities of plasma proteins: Secondary | ICD-10-CM | POA: Insufficient documentation

## 2021-11-27 DIAGNOSIS — Z950 Presence of cardiac pacemaker: Secondary | ICD-10-CM | POA: Insufficient documentation

## 2021-11-27 DIAGNOSIS — I255 Ischemic cardiomyopathy: Secondary | ICD-10-CM | POA: Diagnosis present

## 2021-11-27 DIAGNOSIS — I5022 Chronic systolic (congestive) heart failure: Secondary | ICD-10-CM | POA: Diagnosis not present

## 2021-11-27 DIAGNOSIS — R079 Chest pain, unspecified: Principal | ICD-10-CM | POA: Diagnosis present

## 2021-11-27 DIAGNOSIS — Z7982 Long term (current) use of aspirin: Secondary | ICD-10-CM | POA: Diagnosis not present

## 2021-11-27 DIAGNOSIS — I110 Hypertensive heart disease with heart failure: Secondary | ICD-10-CM | POA: Diagnosis not present

## 2021-11-27 DIAGNOSIS — Z7984 Long term (current) use of oral hypoglycemic drugs: Secondary | ICD-10-CM | POA: Insufficient documentation

## 2021-11-27 DIAGNOSIS — Z955 Presence of coronary angioplasty implant and graft: Secondary | ICD-10-CM | POA: Insufficient documentation

## 2021-11-27 DIAGNOSIS — E7800 Pure hypercholesterolemia, unspecified: Secondary | ICD-10-CM

## 2021-11-27 DIAGNOSIS — E119 Type 2 diabetes mellitus without complications: Secondary | ICD-10-CM | POA: Insufficient documentation

## 2021-11-27 DIAGNOSIS — I200 Unstable angina: Secondary | ICD-10-CM | POA: Diagnosis present

## 2021-11-27 DIAGNOSIS — Z79899 Other long term (current) drug therapy: Secondary | ICD-10-CM | POA: Diagnosis not present

## 2021-11-27 DIAGNOSIS — G4733 Obstructive sleep apnea (adult) (pediatric): Secondary | ICD-10-CM

## 2021-11-27 DIAGNOSIS — E118 Type 2 diabetes mellitus with unspecified complications: Secondary | ICD-10-CM | POA: Diagnosis present

## 2021-11-27 DIAGNOSIS — Z9989 Dependence on other enabling machines and devices: Secondary | ICD-10-CM

## 2021-11-27 DIAGNOSIS — I25110 Atherosclerotic heart disease of native coronary artery with unstable angina pectoris: Secondary | ICD-10-CM | POA: Insufficient documentation

## 2021-11-27 LAB — HEPATIC FUNCTION PANEL
ALT: 41 U/L (ref 0–44)
AST: 29 U/L (ref 15–41)
Albumin: 3.6 g/dL (ref 3.5–5.0)
Alkaline Phosphatase: 86 U/L (ref 38–126)
Bilirubin, Direct: 0.1 mg/dL (ref 0.0–0.2)
Indirect Bilirubin: 0.8 mg/dL (ref 0.3–0.9)
Total Bilirubin: 0.9 mg/dL (ref 0.3–1.2)
Total Protein: 7.2 g/dL (ref 6.5–8.1)

## 2021-11-27 LAB — URINALYSIS, ROUTINE W REFLEX MICROSCOPIC
Bilirubin Urine: NEGATIVE
Glucose, UA: >=500 mg/dL — AB
Hgb urine dipstick: NEGATIVE
Ketones, ur: NEGATIVE mg/dL
Leukocytes,Ua: NEGATIVE
Nitrite: NEGATIVE
Protein, ur: NEGATIVE mg/dL
Specific Gravity, Urine: 1.01 (ref 1.005–1.030)
pH: 5.5 (ref 5.0–8.0)

## 2021-11-27 LAB — BASIC METABOLIC PANEL
Anion gap: 8 (ref 5–15)
BUN: 15 mg/dL (ref 6–20)
CO2: 26 mmol/L (ref 22–32)
Calcium: 8.4 mg/dL — ABNORMAL LOW (ref 8.9–10.3)
Chloride: 100 mmol/L (ref 98–111)
Creatinine, Ser: 1.11 mg/dL (ref 0.61–1.24)
GFR, Estimated: >60 mL/min (ref >60)
Glucose, Bld: 318 mg/dL — ABNORMAL HIGH (ref 70–99)
Potassium: 4.3 mmol/L (ref 3.5–5.1)
Sodium: 134 mmol/L — ABNORMAL LOW (ref 135–145)

## 2021-11-27 LAB — CBC
HCT: 45 % (ref 39.0–52.0)
Hemoglobin: 15.7 g/dL (ref 13.0–17.0)
MCH: 31.3 pg (ref 26.0–34.0)
MCHC: 34.9 g/dL (ref 30.0–36.0)
MCV: 89.8 fL (ref 80.0–100.0)
Platelets: 198 10*3/uL (ref 150–400)
RBC: 5.01 MIL/uL (ref 4.22–5.81)
RDW: 12.4 % (ref 11.5–15.5)
WBC: 7 10*3/uL (ref 4.0–10.5)
nRBC: 0 % (ref 0.0–0.2)

## 2021-11-27 LAB — CK: Total CK: 75 U/L (ref 49–397)

## 2021-11-27 LAB — URINALYSIS, MICROSCOPIC (REFLEX)

## 2021-11-27 LAB — BRAIN NATRIURETIC PEPTIDE: B Natriuretic Peptide: 59.2 pg/mL (ref 0.0–100.0)

## 2021-11-27 LAB — LIPASE, BLOOD: Lipase: 37 U/L (ref 11–51)

## 2021-11-27 LAB — TROPONIN I (HIGH SENSITIVITY)
Troponin I (High Sensitivity): 35 ng/L — ABNORMAL HIGH (ref <18)
Troponin I (High Sensitivity): 39 ng/L — ABNORMAL HIGH (ref <18)

## 2021-11-27 MED ORDER — NITROGLYCERIN 0.4 MG SL SUBL: 0.4000 mg | SUBLINGUAL_TABLET | SUBLINGUAL | Status: DC | PRN

## 2021-11-27 MED ORDER — EZETIMIBE 10 MG PO TABS
10.0000 mg | ORAL_TABLET | Freq: Every day | ORAL | Status: DC
  Administered 2021-11-28 – 2021-11-29 (×2): 10 mg via ORAL
  Filled 2021-11-27 (×2): qty 1

## 2021-11-27 MED ORDER — IOHEXOL 350 MG/ML SOLN
100.0000 mL | Freq: Once | INTRAVENOUS | Status: AC | PRN
  Administered 2021-11-27: 75 mL via INTRAVENOUS

## 2021-11-27 MED ORDER — ISOSORBIDE MONONITRATE ER 30 MG PO TB24
30.0000 mg | ORAL_TABLET | Freq: Every day | ORAL | Status: DC
  Administered 2021-11-28 – 2021-11-29 (×2): 30 mg via ORAL
  Filled 2021-11-27 (×2): qty 1

## 2021-11-27 MED ORDER — ESCITALOPRAM OXALATE 10 MG PO TABS
10.0000 mg | ORAL_TABLET | Freq: Every day | ORAL | Status: DC
  Administered 2021-11-28 – 2021-11-29 (×2): 10 mg via ORAL
  Filled 2021-11-27 (×2): qty 1

## 2021-11-27 MED ORDER — ACETAMINOPHEN 325 MG PO TABS
650.0000 mg | ORAL_TABLET | Freq: Once | ORAL | Status: AC
  Administered 2021-11-27: 650 mg via ORAL
  Filled 2021-11-27: qty 2

## 2021-11-27 MED ORDER — ASPIRIN 81 MG PO TBEC
81.0000 mg | DELAYED_RELEASE_TABLET | Freq: Every day | ORAL | Status: DC
  Administered 2021-11-28 – 2021-11-29 (×2): 81 mg via ORAL
  Filled 2021-11-27 (×2): qty 1

## 2021-11-27 MED ORDER — SPIRONOLACTONE 12.5 MG HALF TABLET: 12.5000 mg | ORAL_TABLET | Freq: Every day | ORAL | Status: DC

## 2021-11-27 MED ORDER — DAPAGLIFLOZIN PROPANEDIOL 10 MG PO TABS
10.0000 mg | ORAL_TABLET | Freq: Every day | ORAL | Status: DC
  Administered 2021-11-28 – 2021-11-29 (×2): 10 mg via ORAL
  Filled 2021-11-27 (×2): qty 1

## 2021-11-27 MED ORDER — CARVEDILOL 12.5 MG PO TABS
12.5000 mg | ORAL_TABLET | Freq: Two times a day (BID) | ORAL | Status: DC
  Administered 2021-11-28 – 2021-11-29 (×3): 12.5 mg via ORAL
  Filled 2021-11-27 (×3): qty 1

## 2021-11-27 MED ORDER — HEPARIN (PORCINE) 25000 UT/250ML-% IV SOLN
2000.0000 [IU]/h | INTRAVENOUS | Status: DC
  Administered 2021-11-27: 1500 [IU]/h via INTRAVENOUS
  Administered 2021-11-28: 2000 [IU]/h via INTRAVENOUS
  Filled 2021-11-27 (×2): qty 250

## 2021-11-27 MED ORDER — PANTOPRAZOLE SODIUM 40 MG PO TBEC
40.0000 mg | DELAYED_RELEASE_TABLET | Freq: Every day | ORAL | Status: DC
  Administered 2021-11-28 – 2021-11-29 (×2): 40 mg via ORAL
  Filled 2021-11-27 (×2): qty 1

## 2021-11-27 MED ORDER — ONDANSETRON HCL 4 MG/2ML IJ SOLN
4.0000 mg | Freq: Four times a day (QID) | INTRAMUSCULAR | Status: DC | PRN
  Administered 2021-11-28: 4 mg via INTRAVENOUS
  Filled 2021-11-27: qty 2

## 2021-11-27 MED ORDER — MEXILETINE HCL 200 MG PO CAPS
200.0000 mg | ORAL_CAPSULE | Freq: Two times a day (BID) | ORAL | Status: DC
  Administered 2021-11-28 – 2021-11-29 (×4): 200 mg via ORAL
  Filled 2021-11-27 (×4): qty 1

## 2021-11-27 MED ORDER — RANOLAZINE ER 500 MG PO TB12
500.0000 mg | ORAL_TABLET | Freq: Two times a day (BID) | ORAL | Status: DC
  Administered 2021-11-28 – 2021-11-29 (×4): 500 mg via ORAL
  Filled 2021-11-27 (×4): qty 1

## 2021-11-27 MED ORDER — ROSUVASTATIN CALCIUM 20 MG PO TABS
40.0000 mg | ORAL_TABLET | Freq: Every evening | ORAL | Status: DC
  Administered 2021-11-28: 40 mg via ORAL
  Filled 2021-11-27: qty 2

## 2021-11-27 MED ORDER — FUROSEMIDE 40 MG PO TABS: 40.0000 mg | ORAL_TABLET | Freq: Every day | ORAL | Status: DC

## 2021-11-27 MED ORDER — HEPARIN BOLUS VIA INFUSION
4000.0000 [IU] | Freq: Once | INTRAVENOUS | Status: AC
  Administered 2021-11-27: 4000 [IU] via INTRAVENOUS

## 2021-11-27 MED ORDER — SODIUM CHLORIDE 0.9 % IV BOLUS
500.0000 mL | Freq: Once | INTRAVENOUS | Status: AC
  Administered 2021-11-27: 500 mL via INTRAVENOUS

## 2021-11-27 MED ORDER — ACETAMINOPHEN 325 MG PO TABS
650.0000 mg | ORAL_TABLET | ORAL | Status: DC | PRN
  Administered 2021-11-28 (×2): 650 mg via ORAL
  Filled 2021-11-27 (×2): qty 2

## 2021-11-27 NOTE — ED Notes (Signed)
ED Provider at bedside. 

## 2021-11-27 NOTE — ED Notes (Signed)
Patient given personal fan per request.

## 2021-11-27 NOTE — ED Notes (Signed)
Dimmed lights and cool cloth on forehead

## 2021-11-27 NOTE — ED Notes (Signed)
Patient endorses a headache, rated 8/10.  Order given for tylenol.  Patient also requesting crackers.  Patient given pack of peanut butter crackers, ok per MD Belfi.

## 2021-11-27 NOTE — ED Provider Notes (Signed)
[facility] EMERGENCY DEPARTMENT Provider Note   CSN: [id number] Arrival date & time: 11/27/21  1201     History  Chief Complaint  Patient presents with   Chest Pain    Joe [name] is a 51 y.o. male.  The history is provided by the patient and medical records. No language interpreter was used.  Chest Pain Pain location:  L chest Pain quality: aching, crushing, pressure and sharp   Pain radiates to:  Does not radiate Pain severity:  Moderate Onset quality:  Gradual Duration:  1 day Timing:  Constant Progression:  Improving Chronicity:  New Relieved by:  Nothing Worsened by:  Deep breathing and exertion Ineffective treatments:  None tried Associated symptoms: shortness of breath   Associated symptoms: no abdominal pain, no altered mental status, no back pain, no cough, no diaphoresis, no fatigue, no fever, no headache, no lower extremity edema, no nausea, no palpitations, no vomiting and no weakness   Risk factors: coronary artery disease        Home Medications Prior to Admission medications   Medication Sig Start Date End Date Taking? Authorizing Provider  acetaminophen (TYLENOL) 500 MG tablet Take 1,000 mg by mouth every 6 (six) hours as needed for moderate pain or headache.    [provider]  allopurinol (ZYLOPRIM) 100 MG tablet Take 100 mg by mouth daily as needed (for gout).     [provider]  aspirin EC 81 MG tablet Take 1 tablet (81 mg total) by mouth daily. 09/05/17   Park Liter, MD  carvedilol (COREG) 12.5 MG tablet Take 1 tablet (12.5 mg total) by mouth 2 (two) times daily. 11/02/21   Camnitz, Ocie Doyne, MD  colchicine 0.6 MG tablet Take 0.6 mg by mouth daily as needed (Gout). 05/10/20   [provider]  dapagliflozin propanediol (FARXIGA) 10 MG TABS tablet Take 1 tablet (10 mg total) by mouth daily. 03/21/21   Park Liter, MD  escitalopram (LEXAPRO) 10 MG tablet Take 10 mg by mouth daily. 06/24/21    [provider]  ezetimibe (ZETIA) 10 MG tablet TAKE 1 TABLET BY MOUTH EVERY DAY Patient taking differently: Take 10 mg by mouth daily. 01/31/21   Park Liter, MD  furosemide (LASIX) 40 MG tablet Take 1 tablet (40 mg total) by mouth daily. 03/08/21   Darliss Cheney, MD  halobetasol (ULTRAVATE) 0.05 % cream Apply 1 application topically at bedtime as needed (Eczema).    [provider]  ibuprofen (ADVIL) 200 MG tablet Take 800 mg by mouth every 8 (eight) hours as needed for headache or moderate pain.    [provider]  indomethacin (INDOCIN) 50 MG capsule Take 50 mg by mouth daily as needed for mild pain or moderate pain (Gout). 03/11/19   [provider]  isosorbide mononitrate (IMDUR) 30 MG 24 hr tablet Take 1 tablet (30 mg total) by mouth daily. 10/05/21   Park Liter, MD  metFORMIN (GLUCOPHAGE-XR) 500 MG 24 hr tablet Take 2 tablets (1,000 mg total) by mouth in the morning and at bedtime. 07/01/20   Martinique, Peter M, MD  mexiletine (MEXITIL) 200 MG capsule Take 1 capsule (200 mg total) by mouth 2 (two) times daily. Please make overdue appt with Dr. Curt Bears before anymore refills. Thank you 3rd and Final Attempt 10/05/21   Park Liter, MD  nitroGLYCERIN (NITROSTAT) 0.4 MG SL tablet Place 0.4 mg under the tongue every 5 (five) minutes as needed for chest pain.  [provider]  omeprazole (PRILOSEC) 40 MG capsule Take 1 capsule (40 mg total) by mouth daily. 01/14/19   Park Liter, MD  Polyethylene Glycol 400 (BLINK TEARS OP) Place 2 drops into both eyes 2 (two) times daily as needed (for dry eyes).    [provider]  potassium chloride SA (KLOR-CON M) 20 MEQ tablet Take 1 tablet (20 mEq total) by mouth daily. 10/05/21   Park Liter, MD  ranolazine (RANEXA) 500 MG 12 hr tablet Take 500 mg by mouth 2 (two) times daily.    [provider]  rosuvastatin (CRESTOR) 40 MG tablet Take 1 tablet (40 mg total) by  mouth every evening. 07/15/21   Park Liter, MD  spironolactone (ALDACTONE) 25 MG tablet Take 0.5 tablets (12.5 mg total) by mouth daily. 03/25/21   Bensimhon, Shaune Pascal, MD      Allergies    Patient has no known allergies.    Review of Systems   Review of Systems  Constitutional:  Negative for chills, diaphoresis, fatigue and fever.  HENT:  Negative for congestion.   Eyes:  Negative for visual disturbance.  Respiratory:  Positive for shortness of breath. Negative for cough, chest tightness and wheezing.   Cardiovascular:  Positive for chest pain. Negative for palpitations and leg swelling.  Gastrointestinal:  Negative for abdominal pain, constipation, diarrhea, nausea and vomiting.  Genitourinary:  Negative for dysuria and flank pain.  Musculoskeletal:  Negative for back pain, neck pain and neck stiffness.  Skin:  Negative for rash and wound.  Neurological:  Negative for weakness and headaches.  Psychiatric/Behavioral:  Negative for agitation and confusion.   All other systems reviewed and are negative.   Physical Exam Updated Vital Signs BP 116/84 (BP Location: Right Arm)   Pulse (!) 119   Temp 98.8 F (37.1 C) (Oral)   Resp (!) 24   Ht '6\' 3"'$  (1.905 m)   Wt (!) 158.8 kg   SpO2 95%   BMI 43.75 kg/m  Physical Exam Vitals and nursing note reviewed.  Constitutional:      General: He is not in acute distress.    Appearance: He is well-developed. He is not ill-appearing, toxic-appearing or diaphoretic.  HENT:     Head: Normocephalic and atraumatic.  Eyes:     Extraocular Movements: Extraocular movements intact.     Conjunctiva/sclera: Conjunctivae normal.     Pupils: Pupils are equal, round, and reactive to light.  Cardiovascular:     Rate and Rhythm: Regular rhythm. Tachycardia present.     Heart sounds: No murmur heard. Pulmonary:     Effort: Pulmonary effort is normal. No respiratory distress.     Breath sounds: Normal breath sounds. No decreased breath sounds,  wheezing, rhonchi or rales.  Chest:     Chest wall: No tenderness.  Abdominal:     Palpations: Abdomen is soft.     Tenderness: There is no abdominal tenderness. There is no guarding or rebound.  Musculoskeletal:        General: No swelling.     Cervical back: Neck supple.     Right lower leg: No tenderness. No edema.     Left lower leg: No tenderness. No edema.  Skin:    General: Skin is warm and dry.     Capillary Refill: Capillary refill takes less than 2 seconds.     Findings: No erythema.  Neurological:     Mental Status: He is alert.  Psychiatric:  Mood and Affect: Mood normal.     ED Results / Procedures / Treatments   Labs (all labs ordered are listed, but only abnormal results are displayed) Labs Reviewed  BASIC METABOLIC PANEL - Abnormal; Notable for the following components:      Result Value   Sodium 134 (*)    Glucose, Bld 318 (*)    Calcium 8.4 (*)    All other components within normal limits  URINALYSIS, ROUTINE W REFLEX MICROSCOPIC - Abnormal; Notable for the following components:   Glucose, UA >=500 (*)    All other components within normal limits  URINALYSIS, MICROSCOPIC (REFLEX) - Abnormal; Notable for the following components:   Bacteria, UA FEW (*)    All other components within normal limits  TROPONIN I (HIGH SENSITIVITY) - Abnormal; Notable for the following components:   Troponin I (High Sensitivity) 35 (*)    All other components within normal limits  URINE CULTURE  CBC  HEPATIC FUNCTION PANEL  LIPASE, BLOOD  BRAIN NATRIURETIC PEPTIDE  CK  HEPARIN LEVEL (UNFRACTIONATED)  TROPONIN I (HIGH SENSITIVITY)    EKG EKG Interpretation  Date/Time:  Sunday November 27 2021 12:13:40 EDT Ventricular Rate:  119 PR Interval:    QRS Duration: 176 QT Interval:  362 QTC Calculation: 509 R Axis:   269 Text Interpretation: Ventricular-paced rhythm Biventricular pacemaker detected Abnormal ECG When compared with ECG of 25-Mar-2021 09:42, PREVIOUS ECG  IS PRESENT when compared to prior, faster rate. pvcs present. NO sTEMI Confirmed by Antony Blackbird [phone]) on 11/27/2021 12:20:13 PM  Radiology CT Angio Chest PE W and/or Wo Contrast  Result Date: 11/27/2021 CLINICAL DATA:  Tachycardia and left-sided pleuritic chest pain. EXAM: CT ANGIOGRAPHY CHEST WITH CONTRAST TECHNIQUE: Multidetector CT imaging of the chest was performed using the standard protocol during bolus administration of intravenous contrast. Multiplanar CT image reconstructions and MIPs were obtained to evaluate the vascular anatomy. RADIATION DOSE REDUCTION: This exam was performed according to the departmental dose-optimization program which includes automated exposure control, adjustment of the mA and/or kV according to patient size and/or use of iterative reconstruction technique. CONTRAST:  66m OMNIPAQUE IOHEXOL 350 MG/ML SOLN COMPARISON:  12/20/2018. FINDINGS: Cardiovascular: Central pulmonary arteries are well opacified. There is heterogeneous contrast opacification of small are segmental and subsegmental vessels mildly limiting the study. Allowing for this, there is no evidence of a pulmonary embolism. Mild enlargement of the cardiopericardial silhouette. Stable cardioverter-defibrillator. Coronary artery calcifications. No pericardial effusion. Great vessels are normal in caliber. No aortic dissection or atherosclerosis. Widely patent aortic arch branch vessels. Mediastinum/Nodes: No enlarged mediastinal, hilar, or axillary lymph nodes. Thyroid gland, trachea, and esophagus demonstrate no significant findings. Lungs/Pleura: Small ill-defined nodule, right lower lobe, abutting the oblique fissure, 3-4 mm, unchanged from 2020, benign. No follow-up recommended. Tiny subpleural calcified granuloma right lower lobe. Remainder of the lungs is clear. No pleural effusion or pneumothorax. Upper Abdomen: No acute findings. Musculoskeletal: No fracture or acute finding. No bone lesion. No chest wall  mass. Review of the MIP images confirms the above findings. IMPRESSION: 1. No evidence of a pulmonary embolism. 2. No acute findings. 3. Mild cardiomegaly and coronary artery calcifications. Electronically Signed   By: DLajean ManesM.D.   On: 11/27/2021 13:51   DG Chest 2 View  Result Date: 11/27/2021 CLINICAL DATA:  Pt complains of dehydration, body cramps, and chest pain x a few days. States he has been trying to drink a lot of liquid IV to rehydrate but has resulted in vomiting. Blood  pressure fluctuates. Denies sob. Nonsmoker. Hx of MI in 2020. Has 5 stents in heart. Has pacemaker. EXAM: CHEST - 2 VIEW COMPARISON:  03/04/2021 and older studies. FINDINGS: Cardiac silhouette is borderline enlarged. Stable left anterior chest wall biventricular cardioverter-defibrillator. Normal mediastinal and hilar contours. Clear lungs.  No pleural effusion or pneumothorax. Skeletal structures are intact. IMPRESSION: No active cardiopulmonary disease. Electronically Signed   By: Lajean Manes M.D.   On: 11/27/2021 12:37    Procedures Procedures    CRITICAL CARE Performed by: Gwenyth Allegra Taffany Heiser Total critical care time: 40 minutes Critical care time was exclusive of separately billable procedures and treating other patients. Critical care was necessary to treat or prevent imminent or life-threatening deterioration. Critical care was time spent personally by me on the following activities: development of treatment plan with patient and/or surrogate as well as nursing, discussions with consultants, evaluation of patient's response to treatment, examination of patient, obtaining history from patient or surrogate, ordering and performing treatments and interventions, ordering and review of laboratory studies, ordering and review of radiographic studies, pulse oximetry and re-evaluation of patient's condition.   Medications Ordered in ED Medications  heparin bolus via infusion 4,000 Units (has no administration  in time range)  heparin ADULT infusion 100 units/mL (25000 units/257m) (has no administration in time range)  sodium chloride 0.9 % bolus 500 mL (0 mLs Intravenous Stopped 11/27/21 1408)  iohexol (OMNIPAQUE) 350 MG/ML injection 100 mL (75 mLs Intravenous Contrast Given 11/27/21 1327)    ED Course/ Medical Decision Making/ A&P                           Medical Decision Making Amount and/or Complexity of Data Reviewed Labs: ordered. Radiology: ordered.  Risk Prescription drug management. Decision regarding hospitalization.    RKylan Liberatiis a 51y.o. male with a past medical history significant for CAD status post PCI, CHF, diabetes, hypertension, hypercholesterolemia, GERD, ICD/pacemaker, and obesity who presents with chest pain.  According to patient, for the last several days he has had some soreness in his left chest that does not radiate.  He reports some shortness of breath.  He reports that today, he had onset of sharp pleuritic chest pain in his left chest that he is never felt before.  He reports he had a massage on Wednesday and has been having muscle cramping and soreness ever since.  He thinks he is dehydrated with dry mouth and reports his urine has been much darker.  He denies history of DVT or PE.  He reports no fevers, chills, congestion, or cough.  Denies any constipation or diarrhea.  Patient describes his sharp chest pain is severe.  He does not report any diaphoresis or syncope.  On exam, lungs were clear and chest was nontender.  Abdomen was nontender.  Normal bowel sounds.  Legs were nontender and nonedematous.  Patient did not have a murmur on my exam.  Patient was tachycardic with rate in the 1 10-1 20 range and oxygen saturations were dipping into the 80s on room air.  We will place him on oxygen.  EKG did not show STEMI and showed paced rhythm.  Clinically I am concerned about this new pleuritic chest pain with shortness of breath and tachycardia.  Will get a CT PE  study to rule out PE.  I also concerned about the diffuse muscle cramping and dark urine after massage that he could have rhabdo or some other abnormality with  his electrolytes.  We will get basic labs.  Will trend troponins.  If work-up is complete reassuring, anticipate discussion with cardiology given this left-sided chest pressure and sharp discomfort given his history of known coronary blockages that he reports did not get fully fixed.  Anticipate reassessment after work-up.  3:11 PM CT PE study does not show evidence of pulmonary embolism.  Initial troponin is elevated, will trend.  Rest of labs began to return as well.  CK negative, doubt rhabdo.  BNP nonelevated.  Urinalysis reassuring.  Hepatic function normal.  CBC normal.  Spoke to Dr. Mickey Farber with cardiology who agrees that patient needs to be admitted for suspected unstable angina and likely heart catheterization.  They requested heparinization which was ordered and patient will be admitted to [facility] to the cardiology service for further management.  Patient remains chest pain-free at this time and is resting comfortably.          Final Clinical Impression(s) / ED Diagnoses Final diagnoses:  Nonspecific chest pain  Elevated troponin    Clinical Impression: 1. Nonspecific chest pain   2. Elevated troponin     Disposition: Admit  This note was prepared with assistance of Dragon voice recognition software. Occasional wrong-word or sound-a-like substitutions may have occurred due to the inherent limitations of voice recognition software.     Zavon Hyson, Gwenyth Allegra, MD 11/27/21 [phone]

## 2021-11-27 NOTE — ED Triage Notes (Signed)
Pt arrives pov, to triage in wheelchair, c/o left side CP, generalized body cramping,and endorses concern for dehydration. '4mg'$  zofran at 10am

## 2021-11-27 NOTE — ED Notes (Signed)
[facility] at bedside, Consuelo Pandy RN updated.

## 2021-11-27 NOTE — Progress Notes (Signed)
   11/27/21 2258  Assess: MEWS Score  Temp 98 F (36.7 C)  BP 100/71  MAP (mmHg) 80  Pulse Rate (!) 104  ECG Heart Rate (!) 104  Resp 20  SpO2 96 %  O2 Device Nasal Cannula  O2 Flow Rate (L/min) 2 L/min  Assess: MEWS Score  MEWS Temp 0  MEWS Systolic 1  MEWS Pulse 1  MEWS RR 0  MEWS LOC 0  MEWS Score 2  MEWS Score Color Yellow  Assess: if the MEWS score is Yellow or Red  Were vital signs taken at a resting state? Yes  Focused Assessment No change from prior assessment  Does the patient meet 2 or more of the SIRS criteria? No  MEWS guidelines implemented *See Row Information* Yes  Treat  MEWS Interventions Administered scheduled meds/treatments  Pain Scale 0-10  Pain Score 0  Take Vital Signs  Increase Vital Sign Frequency  Yellow: Q 2hr X 2 then Q 4hr X 2, if remains yellow, continue Q 4hrs  Escalate  MEWS: Escalate Yellow: discuss with charge nurse/RN and consider discussing with provider and RRT  Notify: Charge Nurse/RN  Name of Charge Nurse/RN Notified Heather, RN  Date Charge Nurse/RN Notified 11/27/21  Time Charge Nurse/RN Notified 2259  Document  Patient Outcome Other (Comment) (Stable)  Progress note created (see row info) Yes  Assess: SIRS CRITERIA  SIRS Temperature  0  SIRS Pulse 1  SIRS Respirations  0  SIRS WBC 0  SIRS Score Sum  1

## 2021-11-27 NOTE — Progress Notes (Signed)
ANTICOAGULATION CONSULT NOTE - Initial Consult  Pharmacy Consult for heparin Indication: chest pain/ACS  No Known Allergies  Patient Measurements: Height: '6\' 3"'$  (190.5 cm) Weight: (!) 158.8 kg (350 lb) IBW/kg (Calculated) : 84.5 Heparin Dosing Weight: 121.6kg  Vital Signs: Temp: 98.8 F (37.1 C) (06/11 1211) Temp Source: Oral (06/11 1211) BP: 119/80 (06/11 1500) Pulse Rate: 112 (06/11 1500)  Labs: Recent Labs    11/27/21 1240  HGB 15.7  HCT 45.0  PLT 198  CREATININE 1.11  CKTOTAL 75  TROPONINIHS 35*    Estimated Creatinine Clearance: 127.2 mL/min (by C-G formula based on SCr of 1.11 mg/dL).   Medical History: Past Medical History:  Diagnosis Date   Abnormal EKG    Acute systolic heart failure ([place]) 09/06/2017   Angina pectoris ([place]) 09/05/2017   Body mass index 45.0-49.9, adult (HCC)    CAD S/P percutaneous coronary angioplasty 09/07/2017   mLAD and dLAD PCI with DES 09/06/17   CHF (congestive heart failure) (HCC)    Chronic systolic (congestive) heart failure ([place]) 12/25/6281   Complication of anesthesia 1995   "had hard time waking up; breathing w/vasectomy"   Coronary artery disease    Erectile dysfunction    Essential hypertension, benign    Fatigue    GERD (gastroesophageal reflux disease)    Gout    High cholesterol    "just started tx yesterday" (09/06/2017)   History of gout    "RX prn" (09/06/2017)   Ischemic cardiomyopathy 09/07/2017   EF 25-35%   Muscular chest pain    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   Below Left Nare (MOH's)   Obesity    Obstructive sleep apnea    OSA on CPAP    Plantar fasciitis    Pre-operative clearance 02/11/2018   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/02/2019   [employer]   Right bundle branch block    Shortness of breath 09/05/2017   Testosterone deficiency    Type 2 diabetes mellitus with complication, without long-term current use of insulin ([place]) 09/05/2017   Type II  diabetes mellitus ([place])    "started tx 08/28/2017"    Assessment: 77 YOM presenting with CP, hx of CAD s/p PCI, he is not on anticoagulation PTA, CBC wnl  Goal of Therapy:  Heparin level 0.3-0.7 units/ml Monitor platelets by anticoagulation protocol: Yes   Plan:  Heparin 4000 units IV x 1, and gtt at 1500 units/hr F/u 6 hour heparin level  Bertis Ruddy, PharmD Clinical Pharmacist ED Pharmacist Phone # [phone] 11/27/2021 3:14 PM

## 2021-11-28 ENCOUNTER — Inpatient Hospital Stay (HOSPITAL_COMMUNITY): Payer: 59

## 2021-11-28 DIAGNOSIS — I248 Other forms of acute ischemic heart disease: Secondary | ICD-10-CM | POA: Diagnosis not present

## 2021-11-28 DIAGNOSIS — I200 Unstable angina: Secondary | ICD-10-CM | POA: Diagnosis not present

## 2021-11-28 LAB — CBC
HCT: 43.4 % (ref 39.0–52.0)
Hemoglobin: 15 g/dL (ref 13.0–17.0)
MCH: 31.7 pg (ref 26.0–34.0)
MCHC: 34.6 g/dL (ref 30.0–36.0)
MCV: 91.8 fL (ref 80.0–100.0)
Platelets: 178 10*3/uL (ref 150–400)
RBC: 4.73 MIL/uL (ref 4.22–5.81)
RDW: 12.4 % (ref 11.5–15.5)
WBC: 4.2 10*3/uL (ref 4.0–10.5)
nRBC: 0 % (ref 0.0–0.2)

## 2021-11-28 LAB — GLUCOSE, CAPILLARY
Glucose-Capillary: 250 mg/dL — ABNORMAL HIGH (ref 70–99)
Glucose-Capillary: 214 mg/dL — ABNORMAL HIGH (ref 70–99)
Glucose-Capillary: 197 mg/dL — ABNORMAL HIGH (ref 70–99)
Glucose-Capillary: 215 mg/dL — ABNORMAL HIGH (ref 70–99)
Glucose-Capillary: 216 mg/dL — ABNORMAL HIGH (ref 70–99)

## 2021-11-28 LAB — LIPID PANEL
Cholesterol: 172 mg/dL (ref 0–200)
HDL: 27 mg/dL — ABNORMAL LOW (ref >40)
LDL Cholesterol: 96 mg/dL (ref 0–99)
Total CHOL/HDL Ratio: 6.4 RATIO
Triglycerides: 244 mg/dL — ABNORMAL HIGH (ref <150)
VLDL: 49 mg/dL — ABNORMAL HIGH (ref 0–40)

## 2021-11-28 LAB — MAGNESIUM: Magnesium: 2 mg/dL (ref 1.7–2.4)

## 2021-11-28 LAB — BASIC METABOLIC PANEL
Anion gap: 10 (ref 5–15)
BUN: 11 mg/dL (ref 6–20)
CO2: 26 mmol/L (ref 22–32)
Calcium: 8.4 mg/dL — ABNORMAL LOW (ref 8.9–10.3)
Chloride: 101 mmol/L (ref 98–111)
Creatinine, Ser: 0.98 mg/dL (ref 0.61–1.24)
GFR, Estimated: >60 mL/min (ref >60)
Glucose, Bld: 257 mg/dL — ABNORMAL HIGH (ref 70–99)
Potassium: 3.6 mmol/L (ref 3.5–5.1)
Sodium: 137 mmol/L (ref 135–145)

## 2021-11-28 LAB — URINE CULTURE: Culture: NO GROWTH

## 2021-11-28 LAB — HEPARIN LEVEL (UNFRACTIONATED)
Heparin Unfractionated: <0.1 IU/mL — ABNORMAL LOW (ref 0.30–0.70)
Heparin Unfractionated: 0.19 IU/mL — ABNORMAL LOW (ref 0.30–0.70)

## 2021-11-28 LAB — ECHOCARDIOGRAM COMPLETE
Calc EF: 29.8 %
Height: 76 in
MV M vel: 4.21 m/s
MV Peak grad: 70.9 mmHg
S' Lateral: 5.9 cm
Single Plane A2C EF: 33.9 %
Single Plane A4C EF: 30.1 %
Weight: 5534.43 oz

## 2021-11-28 LAB — PROTIME-INR
INR: 1.1 (ref 0.8–1.2)
Prothrombin Time: 13.9 seconds (ref 11.4–15.2)

## 2021-11-28 MED ORDER — PERFLUTREN LIPID MICROSPHERE
1.0000 mL | INTRAVENOUS | Status: AC | PRN
  Administered 2021-11-28: 3 mL via INTRAVENOUS

## 2021-11-28 MED ORDER — INSULIN ASPART 100 UNIT/ML IJ SOLN
0.0000 [IU] | Freq: Three times a day (TID) | INTRAMUSCULAR | Status: DC
  Administered 2021-11-28: 3 [IU] via SUBCUTANEOUS
  Administered 2021-11-28: 5 [IU] via SUBCUTANEOUS
  Administered 2021-11-29: 3 [IU] via SUBCUTANEOUS

## 2021-11-28 MED ORDER — INSULIN ASPART 100 UNIT/ML IJ SOLN
0.0000 [IU] | Freq: Every day | INTRAMUSCULAR | Status: DC
  Administered 2021-11-28: 2 [IU] via SUBCUTANEOUS

## 2021-11-28 MED ORDER — HEPARIN BOLUS VIA INFUSION
4000.0000 [IU] | Freq: Once | INTRAVENOUS | Status: AC
  Administered 2021-11-28: 4000 [IU] via INTRAVENOUS
  Filled 2021-11-28: qty 4000

## 2021-11-28 MED ORDER — ENOXAPARIN SODIUM 40 MG/0.4ML IJ SOSY
40.0000 mg | PREFILLED_SYRINGE | Freq: Every day | INTRAMUSCULAR | Status: DC
  Administered 2021-11-28: 40 mg via SUBCUTANEOUS
  Filled 2021-11-28: qty 0.4

## 2021-11-28 NOTE — Progress Notes (Signed)
  Echocardiogram 2D Echocardiogram has been performed.  Joe [name] 11/28/2021, 11:08 AM

## 2021-11-28 NOTE — Progress Notes (Signed)
  Transition of Care [facility]) Screening Note   Patient Details  Name: Joe [name] Date of Birth: Apr 30, 1971   [facility] of [facility]) CM/SW Contact:    Milas Gain, Tea Phone Number: 11/28/2021, 5:09 PM    Transition of Care Department [facility]) has reviewed patient and no TOC needs have been identified at this time. We will continue to monitor patient advancement through interdisciplinary progression rounds. If new patient transition needs arise, please place a TOC consult.

## 2021-11-28 NOTE — Plan of Care (Signed)
  Problem: Education: Goal: Knowledge of General Education information will improve Description: Including pain rating scale, medication(s)/side effects and non-pharmacologic comfort measures 11/28/2021 0018 by Kaylyn Lim, RN Outcome: Progressing 11/28/2021 0018 by Kaylyn Lim, RN Outcome: Progressing   Problem: Health Behavior/Discharge Planning: Goal: Ability to manage health-related needs will improve 11/28/2021 0018 by Kaylyn Lim, RN Outcome: Progressing 11/28/2021 0018 by Kaylyn Lim, RN Outcome: Progressing   Problem: Clinical Measurements: Goal: Respiratory complications will improve 11/28/2021 0018 by Kaylyn Lim, RN Outcome: Progressing 11/28/2021 0018 by Kaylyn Lim, RN Outcome: Progressing   Problem: Clinical Measurements: Goal: Cardiovascular complication will be avoided 11/28/2021 0018 by Kaylyn Lim, RN Outcome: Progressing 11/28/2021 0018 by Kaylyn Lim, RN Outcome: Progressing   Problem: Activity: Goal: Risk for activity intolerance will decrease 11/28/2021 0018 by Kaylyn Lim, RN Outcome: Progressing 11/28/2021 0018 by Kaylyn Lim, RN Outcome: Progressing   Problem: Safety: Goal: Ability to remain free from injury will improve 11/28/2021 0018 by Kaylyn Lim, RN Outcome: Progressing 11/28/2021 0018 by Kaylyn Lim, RN Outcome: Progressing   Problem: Cardiac: Goal: Ability to achieve and maintain adequate cardiovascular perfusion will improve 11/28/2021 0018 by Kaylyn Lim, RN Outcome: Progressing 11/28/2021 0018 by Kaylyn Lim, RN Outcome: Progressing

## 2021-11-28 NOTE — Progress Notes (Signed)
ANTICOAGULATION CONSULT NOTE - Follow Up Consult  Pharmacy Consult for heparin Indication: chest pain/ACS  Labs: Recent Labs    11/27/21 1240 11/27/21 1455 11/28/21 0030  HGB 15.7  --  15.0  HCT 45.0  --  43.4  PLT 198  --  178  LABPROT  --   --  13.9  INR  --   --  1.1  HEPARINUNFRC  --   --  <0.10*  CREATININE 1.11  --  0.98  CKTOTAL 75  --   --   TROPONINIHS 35* 39*  --     Assessment: 51yo male subtherapeutic on heparin with initial dosing for CP; no infusion issues or signs of bleeding per RN.  Goal of Therapy:  Heparin level 0.3-0.7 units/ml   Plan:  Will rebolus with heparin 4000 units and increase heparin infusion by 4 units/kgABW/hr to 2000 units/hr and check level in 6 hours.    Wynona Neat, PharmD, BCPS  11/28/2021,1:51 AM

## 2021-11-28 NOTE — Progress Notes (Addendum)
Progress Note  Patient Name: Joe [name] Date of Encounter: 11/28/2021  [facility] Cardiologist: Jenne Campus, MD   Subjective   No chest pain this morning. Family at the bedside.   Inpatient Medications    Scheduled Meds:  aspirin EC  81 mg Oral Daily   carvedilol  12.5 mg Oral BID WC   dapagliflozin propanediol  10 mg Oral Daily   escitalopram  10 mg Oral Daily   ezetimibe  10 mg Oral Daily   isosorbide mononitrate  30 mg Oral Daily   mexiletine  200 mg Oral BID   pantoprazole  40 mg Oral Daily   ranolazine  500 mg Oral BID   rosuvastatin  40 mg Oral QPM   spironolactone  12.5 mg Oral Daily   Continuous Infusions:  heparin 2,000 Units/hr (11/28/21 0600)   PRN Meds: acetaminophen, nitroGLYCERIN, ondansetron (ZOFRAN) IV   Vital Signs    Vitals:   11/28/21 0111 11/28/21 0209 11/28/21 0316 11/28/21 0700  BP: 117/88  125/76   Pulse: (!) 102  (!) 104   Resp: '20  20 20  '$ Temp: 98 F (36.7 C)  98.3 F (36.8 C) 98.3 F (36.8 C)  TempSrc: Oral  Oral Oral  SpO2: 94%  96%   Weight:  (!) 156.9 kg    Height:        Intake/Output Summary (Last 24 hours) at 11/28/2021 0751 Last data filed at 11/28/2021 0600 Gross per 24 hour  Intake 1023.42 ml  Output 500 ml  Net 523.42 ml      11/28/2021    2:09 AM 11/27/2021   10:58 PM 11/27/2021   12:10 PM  Last 3 Weights  Weight (lbs) 345 lb 14.4 oz 345 lb 14.4 oz 350 lb  Weight (kg) 156.9 kg 156.9 kg 158.759 kg      Telemetry    ST, V paced - Personally Reviewed  ECG    No new tracing this morning   Physical Exam   GEN: Obese male, No acute distress sitting on side of the bed Neck: difficult to assess JVD 2/2 neck girth Cardiac: RRR, no murmurs, rubs, or gallops.  Respiratory: Clear to auscultation bilaterally. GI: Soft, nontender, non-distended  MS: No edema; No deformity. Neuro:  Nonfocal  Psych: Normal affect   Labs    High Sensitivity Troponin:   Recent Labs  Lab 11/27/21 1240 11/27/21 1455   TROPONINIHS 35* 39*     Chemistry Recent Labs  Lab 11/27/21 1240 11/28/21 0030 11/28/21 0044  NA 134* 137  --   K 4.3 3.6  --   CL 100 101  --   CO2 26 26  --   GLUCOSE 318* 257*  --   BUN 15 11  --   CREATININE 1.11 0.98  --   CALCIUM 8.4* 8.4*  --   MG  --   --  2.0  PROT 7.2  --   --   ALBUMIN 3.6  --   --   AST 29  --   --   ALT 41  --   --   ALKPHOS 86  --   --   BILITOT 0.9  --   --   GFRNONAA >60 >60  --   ANIONGAP 8 10  --     Lipids  Recent Labs  Lab 11/28/21 0030  CHOL 172  TRIG 244*  HDL 27*  LDLCALC 96  CHOLHDL 6.4    Hematology Recent Labs  Lab 11/27/21 1240  11/28/21 0030  WBC 7.0 4.2  RBC 5.01 4.73  HGB 15.7 15.0  HCT 45.0 43.4  MCV 89.8 91.8  MCH 31.3 31.7  MCHC 34.9 34.6  RDW 12.4 12.4  PLT 198 178   Thyroid No results for input(s): "TSH", "FREET4" in the last 168 hours.  BNP Recent Labs  Lab 11/27/21 1240  BNP 59.2    DDimer No results for input(s): "DDIMER" in the last 168 hours.   Radiology    CT Angio Chest PE W and/or Wo Contrast  Result Date: 11/27/2021 CLINICAL DATA:  Tachycardia and left-sided pleuritic chest pain. EXAM: CT ANGIOGRAPHY CHEST WITH CONTRAST TECHNIQUE: Multidetector CT imaging of the chest was performed using the standard protocol during bolus administration of intravenous contrast. Multiplanar CT image reconstructions and MIPs were obtained to evaluate the vascular anatomy. RADIATION DOSE REDUCTION: This exam was performed according to the departmental dose-optimization program which includes automated exposure control, adjustment of the mA and/or kV according to patient size and/or use of iterative reconstruction technique. CONTRAST:  81m OMNIPAQUE IOHEXOL 350 MG/ML SOLN COMPARISON:  12/20/2018. FINDINGS: Cardiovascular: Central pulmonary arteries are well opacified. There is heterogeneous contrast opacification of small are segmental and subsegmental vessels mildly limiting the study. Allowing for this, there  is no evidence of a pulmonary embolism. Mild enlargement of the cardiopericardial silhouette. Stable cardioverter-defibrillator. Coronary artery calcifications. No pericardial effusion. Great vessels are normal in caliber. No aortic dissection or atherosclerosis. Widely patent aortic arch branch vessels. Mediastinum/Nodes: No enlarged mediastinal, hilar, or axillary lymph nodes. Thyroid gland, trachea, and esophagus demonstrate no significant findings. Lungs/Pleura: Small ill-defined nodule, right lower lobe, abutting the oblique fissure, 3-4 mm, unchanged from 2020, benign. No follow-up recommended. Tiny subpleural calcified granuloma right lower lobe. Remainder of the lungs is clear. No pleural effusion or pneumothorax. Upper Abdomen: No acute findings. Musculoskeletal: No fracture or acute finding. No bone lesion. No chest wall mass. Review of the MIP images confirms the above findings. IMPRESSION: 1. No evidence of a pulmonary embolism. 2. No acute findings. 3. Mild cardiomegaly and coronary artery calcifications. Electronically Signed   By: DLajean ManesM.D.   On: 11/27/2021 13:51   DG Chest 2 View  Result Date: 11/27/2021 CLINICAL DATA:  Pt complains of dehydration, body cramps, and chest pain x a few days. States he has been trying to drink a lot of liquid IV to rehydrate but has resulted in vomiting. Blood pressure fluctuates. Denies sob. Nonsmoker. Hx of MI in 2020. Has 5 stents in heart. Has pacemaker. EXAM: CHEST - 2 VIEW COMPARISON:  03/04/2021 and older studies. FINDINGS: Cardiac silhouette is borderline enlarged. Stable left anterior chest wall biventricular cardioverter-defibrillator. Normal mediastinal and hilar contours. Clear lungs.  No pleural effusion or pneumothorax. Skeletal structures are intact. IMPRESSION: No active cardiopulmonary disease. Electronically Signed   By: DLajean ManesM.D.   On: 11/27/2021 12:37    Cardiac Studies   Echo: pending  Patient Profile     51y.o. male   coronary artery disease, ischemic cardiomyopathy and chronic systolic heart failure with EF 35 to 40%, CRT-D in place, hyperlipidemia, history of VT who was seen 11/28/2021 for the evaluation of chest pain.  Assessment & Plan    Chest pain with mildly elevated troponin: presented with chest pain concerning for ACS, mildly elevated troponin 35-39. [facility]. No recurrent chest pain overnight. Reports pain is very different from what he has  experienced in the past, mostly a sharp, sticking pain. Will keep NPO for now, review plan with MD -- continue ASA, heparin, crestor, zetia, coerg, imdur and ranexa  -- echo pending  CAD: s/p stenting to LAD/LCx patent on cath 02/2021. Nonobs disease in the RCA, diffusely diseased dLAD -- on ASA, statin, zetia, coreg, Imdur, ranexa   ICM/HFrEF: s/p st Jude CRT-D. Known LVEF of 35-40% 02/2021, BNP 59. Actually reported feeling more dehydrated on admission.  -- continue coreg, farxiga. Reports his spiro was recently stopped 2/2 to low blood pressure  -- holding lasix, entresto  HTN: blood pressures stable -- continue coreg  HLD: LDL 96, Trig 244 -- on Crestor and Zetia   DM: check Hgb a1c -- SSI while inpatient   Freq PVCs/VT: on mexiletine   For questions or updates, please contact [facility] Please consult [email or web address] for contact info under   Signed, Reino Bellis, NP  11/28/2021, 7:51 AM    Agree with note by Reino Bellis NP-C  Patient with known CAD status post CABG last fall revealing patent stents.  He does have a cardiomyopathy status post ICD implantation.  Other problems as outlined.  He was admitted with atypical chest pain and said symptoms of "dehydration".  His troponins were low and flat.  His EKG is paced.  His symptoms were not similar to his prior CAD symptoms during previous stent procedures.  His exam is benign.  At this point, I do not  feel he needs a invasive evaluation.  We will plan on treating him medically.  I anticipate discharge tomorrow.  Lorretta Harp, M.D., [place], [facility], [facility] [address] [place], [place]  [address]  [phone] 11/28/2021 12:25 PM

## 2021-11-28 NOTE — H&P (Signed)
Cardiology Admission History and Physical:   Patient ID: Joe [name] MRN: [id number]; DOB: Feb 28, 1971   Admission date: 11/27/2021  PCP:  Cyndi Bender, [facility] Providers Cardiologist:  Jenne Campus, MD  Electrophysiologist:  Constance Haw, MD       Chief Complaint: Chest pain  Patient Profile:   Joe [name] is a 51 y.o. male with coronary artery disease, ischemic cardiomyopathy and chronic systolic heart failure with EF 35 to 40%, CRT-D in place, hyperlipidemia, history of VT who is being seen 11/28/2021 for the evaluation of chest pain.  History of Present Illness:   Joe [name] is a 51 year old male with a history of coronary artery disease with PCI of his LAD in 2019, ischemic cardiomyopathy with an EF of 30 from 40% and CRT-D, chronic systolic heart failure, hyperlipidemia, VT who presented initially to the emergency department at [facility] for significant dehydration and nausea, but while in the emergency department waiting room had an episode of severe chest pain.  Notes that it was different from his chest pain prior to his prior MIs, as this 1 was sharp substernal chest pain that was intermittent at rest and not radiating.  Felt like someone was punching or poking him in the chest.  He also had associated nausea with this pain.  Initial laboratory work-up was largely unremarkable except for positive troponin 35-39.  BMP was 59.  Creatinine 1.1.  Given his high risk and typical angina, he was given aspirin and started on heparin infusion.  Transferred here for further management and heart catheterization likely.  On arrival to [facility], he is currently without severe chest pain   Past Medical History:  Diagnosis Date   Abnormal EKG    Acute systolic heart failure ([place]) 09/06/2017   Angina pectoris ([place]) 09/05/2017   Body mass index 45.0-49.9, adult (HCC)    CAD S/P percutaneous coronary angioplasty 09/07/2017   mLAD and dLAD PCI with DES  09/06/17   CHF (congestive heart failure) (HCC)    Chronic systolic (congestive) heart failure ([place]) 5/62/1308   Complication of anesthesia 1995   "had hard time waking up; breathing w/vasectomy"   Coronary artery disease    Erectile dysfunction    Essential hypertension, benign    Fatigue    GERD (gastroesophageal reflux disease)    Gout    High cholesterol    "just started tx yesterday" (09/06/2017)   History of gout    "RX prn" (09/06/2017)   Ischemic cardiomyopathy 09/07/2017   EF 25-35%   Muscular chest pain    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   Below Left Nare (MOH's)   Obesity    Obstructive sleep apnea    OSA on CPAP    Plantar fasciitis    Pre-operative clearance 02/11/2018   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/02/2019   [employer] Jude   Right bundle branch block    Shortness of breath 09/05/2017   Testosterone deficiency    Type 2 diabetes mellitus with complication, without long-term current use of insulin ([place]) 09/05/2017   Type II diabetes mellitus ([place])    "started tx 08/28/2017"    Past Surgical History:  Procedure Laterality Date   BACK SURGERY     BIV ICD INSERTION CRT-D N/A 03/19/2019   Procedure: BIV ICD INSERTION CRT-D;  Surgeon: Constance Haw, MD;  Location: [place] [facility];  Service: Cardiovascular;  Laterality: N/A;   CORONARY ANGIOPLASTY WITH STENT PLACEMENT  09/06/2017   "3 stents"   CORONARY STENT INTERVENTION N/A 09/06/2017   Procedure: CORONARY STENT INTERVENTION;  Surgeon: Jettie Booze, MD;  Location: [place] [facility];  Service: Cardiovascular;  Laterality: N/A;   CORONARY STENT INTERVENTION N/A 02/15/2018   Procedure: CORONARY STENT INTERVENTION;  Surgeon: Jettie Booze, MD;  Location: [place] [facility];  Service: Cardiovascular;  Laterality: N/A;   KNEE ARTHROSCOPY Right    LEFT HEART CATH AND CORONARY ANGIOGRAPHY N/A 09/06/2017   Procedure: LEFT HEART CATH AND CORONARY  ANGIOGRAPHY;  Surgeon: Jettie Booze, MD;  Location: [place] [facility];  Service: Cardiovascular;  Laterality: N/A;   LEFT HEART CATH AND CORONARY ANGIOGRAPHY N/A 02/15/2018   Procedure: LEFT HEART CATH AND CORONARY ANGIOGRAPHY;  Surgeon: Jettie Booze, MD;  Location: [place] [facility];  Service: Cardiovascular;  Laterality: N/A;   LEFT HEART CATH AND CORONARY ANGIOGRAPHY N/A 06/27/2018   Procedure: LEFT HEART CATH AND CORONARY ANGIOGRAPHY;  Surgeon: Jettie Booze, MD;  Location: [place] [facility];  Service: Cardiovascular;  Laterality: N/A;   LEFT HEART CATH AND CORONARY ANGIOGRAPHY N/A 06/28/2020   Procedure: LEFT HEART CATH AND CORONARY ANGIOGRAPHY;  Surgeon: Martinique, Peter M, MD;  Location: [place] [facility];  Service: Cardiovascular;  Laterality: N/A;   LUMBAR SPINE SURGERY  ~ 2001   Intradiscal Electrothermoplasty   RIGHT/LEFT HEART CATH AND CORONARY ANGIOGRAPHY N/A 03/07/2021   Procedure: RIGHT/LEFT HEART CATH AND CORONARY ANGIOGRAPHY;  Surgeon: Larey Dresser, MD;  Location: [place] [facility];  Service: Cardiovascular;  Laterality: N/A;   VASECTOMY  1995     Medications Prior to Admission: Prior to Admission medications   Medication Sig Start Date End Date Taking? Authorizing Provider  acetaminophen (TYLENOL) 500 MG tablet Take 1,000 mg by mouth every 6 (six) hours as needed for moderate pain or headache.    [provider]  allopurinol (ZYLOPRIM) 100 MG tablet Take 100 mg by mouth daily as needed (for gout).     [provider]  aspirin EC 81 MG tablet Take 1 tablet (81 mg total) by mouth daily. 09/05/17   Park Liter, MD  carvedilol (COREG) 12.5 MG tablet Take 1 tablet (12.5 mg total) by mouth 2 (two) times daily. 11/02/21   Camnitz, Ocie Doyne, MD  colchicine 0.6 MG tablet Take 0.6 mg by mouth daily as needed (Gout). 05/10/20   [provider]  dapagliflozin propanediol (FARXIGA) 10 MG TABS tablet Take 1 tablet (10 mg total) by  mouth daily. 03/21/21   Park Liter, MD  escitalopram (LEXAPRO) 10 MG tablet Take 10 mg by mouth daily. 06/24/21   [provider]  ezetimibe (ZETIA) 10 MG tablet TAKE 1 TABLET BY MOUTH EVERY DAY Patient taking differently: Take 10 mg by mouth daily. 01/31/21   Park Liter, MD  furosemide (LASIX) 40 MG tablet Take 1 tablet (40 mg total) by mouth daily. 03/08/21   Darliss Cheney, MD  halobetasol (ULTRAVATE) 0.05 % cream Apply 1 application topically at bedtime as needed (Eczema).    [provider]  ibuprofen (ADVIL) 200 MG tablet Take 800 mg by mouth every 8 (eight) hours as needed for headache or moderate pain.    [provider]  indomethacin (INDOCIN) 50 MG capsule Take 50 mg by mouth daily as needed for mild pain or moderate pain (Gout). 03/11/19   [provider]  isosorbide mononitrate (IMDUR) 30 MG 24 hr tablet Take 1 tablet (30 mg  total) by mouth daily. 10/05/21   Park Liter, MD  metFORMIN (GLUCOPHAGE-XR) 500 MG 24 hr tablet Take 2 tablets (1,000 mg total) by mouth in the morning and at bedtime. 07/01/20   Martinique, Peter M, MD  mexiletine (MEXITIL) 200 MG capsule Take 1 capsule (200 mg total) by mouth 2 (two) times daily. Please make overdue appt with Dr. Curt Bears before anymore refills. Thank you 3rd and Final Attempt 10/05/21   Park Liter, MD  nitroGLYCERIN (NITROSTAT) 0.4 MG SL tablet Place 0.4 mg under the tongue every 5 (five) minutes as needed for chest pain.    [provider]  omeprazole (PRILOSEC) 40 MG capsule Take 1 capsule (40 mg total) by mouth daily. 01/14/19   Park Liter, MD  Polyethylene Glycol 400 (BLINK TEARS OP) Place 2 drops into both eyes 2 (two) times daily as needed (for dry eyes).    [provider]  potassium chloride SA (KLOR-CON M) 20 MEQ tablet Take 1 tablet (20 mEq total) by mouth daily. 10/05/21   Park Liter, MD  ranolazine (RANEXA) 500 MG 12 hr tablet Take 500 mg by mouth  2 (two) times daily.    [provider]  rosuvastatin (CRESTOR) 40 MG tablet Take 1 tablet (40 mg total) by mouth every evening. 07/15/21   Park Liter, MD  spironolactone (ALDACTONE) 25 MG tablet Take 0.5 tablets (12.5 mg total) by mouth daily. 03/25/21   Bensimhon, Shaune Pascal, MD     Allergies:   No Known Allergies  Social History:   Social History   Socioeconomic History   Marital status: Married    Spouse name: Not on file   Number of children: Not on file   Years of education: Not on file   Highest education level: Not on file  Occupational History   Not on file  Tobacco Use   Smoking status: Never   Smokeless tobacco: Never  Vaping Use   Vaping Use: Never used  Substance and Sexual Activity   Alcohol use: No   Drug use: Never   Sexual activity: Not on file  Other Topics Concern   Not on file  Social History Narrative   Not on file   Social Determinants of Health   Financial Resource Strain: Not on file  Food Insecurity: Not on file  Transportation Needs: Not on file  Physical Activity: Not on file  Stress: Not on file  Social Connections: Not on file  Intimate Partner Violence: Not on file    Family History:   The patient's family history includes Brain cancer in his mother; Diabetes Mellitus II in his brother and paternal grandfather; Lung cancer in his mother; Lymphoma in his father.    ROS:  Please see the history of present illness.  All other ROS reviewed and negative.     Physical Exam/Data:   Vitals:   11/27/21 1600 11/27/21 1930 11/27/21 2145 11/27/21 2258  BP: 129/67 103/60 104/77 100/71  Pulse: (!) 109 (!) 108 (!) 47 (!) 104  Resp: '20 18 19 20  '$ Temp:   99 F (37.2 C) 98 F (36.7 C)  TempSrc:   Oral Oral  SpO2: 95% 96% 96% 96%  Weight:    (!) 156.9 kg  Height:    '6\' 4"'$  (1.93 m)    Intake/Output Summary (Last 24 hours) at 11/28/2021 0020 Last data filed at 11/27/2021 1408 Gross per 24 hour  Intake 503.08 ml  Output --  Net  503.08 ml  11/27/2021   10:58 PM 11/27/2021   12:10 PM 10/05/2021    9:26 AM  Last 3 Weights  Weight (lbs) 345 lb 14.4 oz 350 lb 354 lb 9.6 oz  Weight (kg) 156.9 kg 158.759 kg 160.846 kg     Body mass index is 42.1 kg/m.  General:  Well nourished, well developed, in no acute distress HEENT: normal Neck: no JVD Vascular: No carotid bruits; Distal pulses 2+ bilaterally   Cardiac:  normal S1, S2; RRR; no murmur  Lungs:  clear to auscultation bilaterally, no wheezing, rhonchi or rales  Abd: soft, nontender, no hepatomegaly  Ext: no edema Musculoskeletal:  No deformities, BUE and BLE strength normal and equal Skin: warm and dry  Neuro:  CNs 2-12 intact, no focal abnormalities noted Psych:  Normal affect    EKG:  The ECG that was done  was personally reviewed and demonstrates paced rhythm  Relevant CV Studies: none  Laboratory Data:  High Sensitivity Troponin:   Recent Labs  Lab 11/27/21 1240 11/27/21 1455  TROPONINIHS 35* 39*      Chemistry Recent Labs  Lab 11/27/21 1240  NA 134*  K 4.3  CL 100  CO2 26  GLUCOSE 318*  BUN 15  CREATININE 1.11  CALCIUM 8.4*  GFRNONAA >60  ANIONGAP 8    Recent Labs  Lab 11/27/21 1240  PROT 7.2  ALBUMIN 3.6  AST 29  ALT 41  ALKPHOS 86  BILITOT 0.9   Lipids No results for input(s): "CHOL", "TRIG", "HDL", "LABVLDL", "LDLCALC", "CHOLHDL" in the last 168 hours. Hematology Recent Labs  Lab 11/27/21 1240  WBC 7.0  RBC 5.01  HGB 15.7  HCT 45.0  MCV 89.8  MCH 31.3  MCHC 34.9  RDW 12.4  PLT 198   Thyroid No results for input(s): "TSH", "FREET4" in the last 168 hours. BNP Recent Labs  Lab 11/27/21 1240  BNP 59.2    DDimer No results for input(s): "DDIMER" in the last 168 hours.   Radiology/Studies:  CT Angio Chest PE W and/or Wo Contrast  Result Date: 11/27/2021 CLINICAL DATA:  Tachycardia and left-sided pleuritic chest pain. EXAM: CT ANGIOGRAPHY CHEST WITH CONTRAST TECHNIQUE: Multidetector CT imaging of the  chest was performed using the standard protocol during bolus administration of intravenous contrast. Multiplanar CT image reconstructions and MIPs were obtained to evaluate the vascular anatomy. RADIATION DOSE REDUCTION: This exam was performed according to the departmental dose-optimization program which includes automated exposure control, adjustment of the mA and/or kV according to patient size and/or use of iterative reconstruction technique. CONTRAST:  90m OMNIPAQUE IOHEXOL 350 MG/ML SOLN COMPARISON:  12/20/2018. FINDINGS: Cardiovascular: Central pulmonary arteries are well opacified. There is heterogeneous contrast opacification of small are segmental and subsegmental vessels mildly limiting the study. Allowing for this, there is no evidence of a pulmonary embolism. Mild enlargement of the cardiopericardial silhouette. Stable cardioverter-defibrillator. Coronary artery calcifications. No pericardial effusion. Great vessels are normal in caliber. No aortic dissection or atherosclerosis. Widely patent aortic arch branch vessels. Mediastinum/Nodes: No enlarged mediastinal, hilar, or axillary lymph nodes. Thyroid gland, trachea, and esophagus demonstrate no significant findings. Lungs/Pleura: Small ill-defined nodule, right lower lobe, abutting the oblique fissure, 3-4 mm, unchanged from 2020, benign. No follow-up recommended. Tiny subpleural calcified granuloma right lower lobe. Remainder of the lungs is clear. No pleural effusion or pneumothorax. Upper Abdomen: No acute findings. Musculoskeletal: No fracture or acute finding. No bone lesion. No chest wall mass. Review of the MIP images confirms the above findings. IMPRESSION: 1.  No evidence of a pulmonary embolism. 2. No acute findings. 3. Mild cardiomegaly and coronary artery calcifications. Electronically Signed   By: Lajean Manes M.D.   On: 11/27/2021 13:51   DG Chest 2 View  Result Date: 11/27/2021 CLINICAL DATA:  Pt complains of dehydration, body  cramps, and chest pain x a few days. States he has been trying to drink a lot of liquid IV to rehydrate but has resulted in vomiting. Blood pressure fluctuates. Denies sob. Nonsmoker. Hx of MI in 2020. Has 5 stents in heart. Has pacemaker. EXAM: CHEST - 2 VIEW COMPARISON:  03/04/2021 and older studies. FINDINGS: Cardiac silhouette is borderline enlarged. Stable left anterior chest wall biventricular cardioverter-defibrillator. Normal mediastinal and hilar contours. Clear lungs.  No pleural effusion or pneumothorax. Skeletal structures are intact. IMPRESSION: No active cardiopulmonary disease. Electronically Signed   By: Lajean Manes M.D.   On: 11/27/2021 12:37     Assessment and Plan:   # Chest pain with elevated troponin Acute onset of symptoms with significant coronary disease history.  He is higher risk.  Patient is made n.p.o. at midnight for likely heart catheterization tomorrow.  Chest pain is mild now and intermittent.  He remains on heparin for anticoagulation. -Continue aspirin 81 mg -Continue heparin for anticoagulation -Continue rosuvastatin 40 mg and zetia 10 -Plan for left heart cath tomorrow; n.p.o. at midnight -Repeat echo ordered -Continue carvedilol, Imdur, and ranolazine  # iCM / chronic systolic HF Has CRT-D in place. Last EF was 35-40% in 02/2021. Not currently in a heart failure exacerbation.  BNP is 59 granted BMI is 42.  Not having severe symptoms.  Actually feels that he is dehydrated. -Hold Lasix -Recently restarted on Entresto, holding for now given presented with low normal blood pressures. -Continue carvedilol as above -Continue to dapa and spironolactone  # h/o VT Continue mexiletine  Risk Assessment/Risk Scores:    TIMI Risk Score for Unstable Angina or Non-ST Elevation MI:   The patient's TIMI risk score is  , which indicates a  % risk of all cause mortality, new or recurrent myocardial infarction or need for urgent revascularization in the next 14  days.  New York Heart Association (NYHA) Functional Class NYHA Class II     Severity of Illness: The appropriate patient status for this patient is INPATIENT. Inpatient status is judged to be reasonable and necessary in order to provide the required intensity of service to ensure the patient's safety. The patient's presenting symptoms, physical exam findings, and initial radiographic and laboratory data in the context of their chronic comorbidities is felt to place them at high risk for further clinical deterioration. Furthermore, it is not anticipated that the patient will be medically stable for discharge from the hospital within 2 midnights of admission.   * I certify that at the point of admission it is my clinical judgment that the patient will require inpatient hospital care spanning beyond 2 midnights from the point of admission due to high intensity of service, high risk for further deterioration and high frequency of surveillance required.*   For questions or updates, please contact [place] Please consult [email or web address] for contact info under     Signed, Doyne Keel, MD  11/28/2021 12:20 AM

## 2021-11-29 ENCOUNTER — Other Ambulatory Visit (HOSPITAL_COMMUNITY): Payer: Self-pay

## 2021-11-29 DIAGNOSIS — I200 Unstable angina: Secondary | ICD-10-CM | POA: Diagnosis not present

## 2021-11-29 DIAGNOSIS — I255 Ischemic cardiomyopathy: Secondary | ICD-10-CM

## 2021-11-29 LAB — HEMOGLOBIN A1C
Hgb A1c MFr Bld: 10.7 % — ABNORMAL HIGH (ref 4.8–5.6)
Mean Plasma Glucose: 260.39 mg/dL

## 2021-11-29 LAB — CBC
HCT: 40.6 % (ref 39.0–52.0)
Hemoglobin: 14 g/dL (ref 13.0–17.0)
MCH: 32 pg (ref 26.0–34.0)
MCHC: 34.5 g/dL (ref 30.0–36.0)
MCV: 92.9 fL (ref 80.0–100.0)
Platelets: 169 10*3/uL (ref 150–400)
RBC: 4.37 MIL/uL (ref 4.22–5.81)
RDW: 12.3 % (ref 11.5–15.5)
WBC: 4 10*3/uL (ref 4.0–10.5)
nRBC: 0 % (ref 0.0–0.2)

## 2021-11-29 LAB — GLUCOSE, CAPILLARY: Glucose-Capillary: 194 mg/dL — ABNORMAL HIGH (ref 70–99)

## 2021-11-29 LAB — LIPOPROTEIN A (LPA): Lipoprotein (a): 20.4 nmol/L (ref <75.0)

## 2021-11-29 MED ORDER — LOSARTAN POTASSIUM 25 MG PO TABS
25.0000 mg | ORAL_TABLET | Freq: Every day | ORAL | Status: DC
  Administered 2021-11-29: 25 mg via ORAL
  Filled 2021-11-29: qty 1

## 2021-11-29 MED ORDER — LOSARTAN POTASSIUM 25 MG PO TABS
25.0000 mg | ORAL_TABLET | Freq: Every day | ORAL | 11 refills | Status: DC
  Filled 2021-11-29 – 2022-01-15 (×2): qty 30, 30d supply, fill #0

## 2021-11-29 NOTE — Plan of Care (Signed)
  Problem: Education: Goal: Knowledge of General Education information will improve Description: Including pain rating scale, medication(s)/side effects and non-pharmacologic comfort measures Outcome: Progressing   Problem: Health Behavior/Discharge Planning: Goal: Ability to manage health-related needs will improve Outcome: Progressing   Problem: Clinical Measurements: Goal: Ability to maintain clinical measurements within normal limits will improve Outcome: Progressing   Problem: Clinical Measurements: Goal: Respiratory complications will improve Outcome: Progressing   Problem: Clinical Measurements: Goal: Cardiovascular complication will be avoided Outcome: Progressing   Problem: Safety: Goal: Ability to remain free from injury will improve Outcome: Progressing   Problem: Skin Integrity: Goal: Risk for impaired skin integrity will decrease Outcome: Progressing

## 2021-11-29 NOTE — Discharge Summary (Addendum)
Discharge Summary    Patient ID: Joe [name] MRN: [id number]; DOB: [birth year]  Admit date: 11/27/2021 Discharge date: 11/29/2021  PCP:  Cyndi Bender, [facility] Providers Cardiologist:  Jenne Campus, MD  Electrophysiologist:  Constance Haw, MD     Discharge Diagnoses    Principal Problem:   Chest pain Active Problems:   Type 2 diabetes mellitus with complication, without long-term current use of insulin ([place])   Ischemic cardiomyopathy   OSA on CPAP   Unstable angina ([place])    Diagnostic Studies/Procedures    Echo: 11/28/21  IMPRESSIONS     1. Very challenging windows. Left ventricular ejection fraction, by  estimation, is 30 to 35%. The left ventricle has moderately decreased  function. The left ventricle demonstrates global hypokinesis. The left  ventricular internal cavity size was  moderately dilated. There is mild left ventricular hypertrophy. Left  ventricular diastolic parameters are indeterminate.   2. Right ventricular systolic function is moderately reduced. The right  ventricular size is normal. There is normal pulmonary artery systolic  pressure.   3. No evidence of mitral valve regurgitation.   4. The aortic valve is grossly normal. Aortic valve regurgitation is not  visualized.   5. Aortic no significant ascending aortic aneurysm.   Conclusion(s)/Recommendation(s): Compared to prior study LV/RV function  has worsened.   FINDINGS   Left Ventricle: Very challenging windows. Left ventricular ejection  fraction, by estimation, is 30 to 35%. The left ventricle has moderately  decreased function. The left ventricle demonstrates global hypokinesis.  Definity contrast agent was given IV to  delineate the left ventricular endocardial borders. The left ventricular  internal cavity size was moderately dilated. There is mild left  ventricular hypertrophy. Left ventricular diastolic parameters are  indeterminate.   Right Ventricle:  The right ventricular size is normal. Right ventricular  systolic function is moderately reduced. There is normal pulmonary artery  systolic pressure. The tricuspid regurgitant velocity is 2.20 m/s, and  with an assumed right atrial  pressure of 8 mmHg, the estimated right ventricular systolic pressure is  91.6 mmHg.   Left Atrium: Left atrial size was normal in size.   Right Atrium: Right atrial size was normal in size.   Pericardium: There is no evidence of pericardial effusion.   Mitral Valve: No evidence of mitral valve regurgitation.   Tricuspid Valve: Tricuspid valve regurgitation is not demonstrated.   Aortic Valve: The aortic valve is grossly normal. Aortic valve  regurgitation is not visualized.   Pulmonic Valve: Pulmonic valve regurgitation is not visualized.   Aorta: No significant ascending aortic aneurysm.   IAS/Shunts: The interatrial septum was not well visualized.  _____________   History of Present Illness     Joe [name] is a 51 y.o. male with coronary artery disease, ischemic cardiomyopathy and chronic systolic heart failure with EF 35 to 40%, CRT-D in place, hyperlipidemia, history of VT who was seen 11/28/2021 for the evaluation of chest pain.  He presented initially to the emergency department at [facility] for significant dehydration and nausea, but while in the emergency department waiting room had an episode of severe chest pain.  Notes that it was different from his chest pain prior to his prior MIs, as this 1 was sharp substernal chest pain that was intermittent at rest and not radiating.  Felt like someone was punching or poking him in the chest.  He also had associated nausea with this pain.  Initial laboratory work-up was largely unremarkable  except for positive troponin 35-39.  BMP was 59.  Creatinine 1.1.   Given his high risk and typical angina, he was given aspirin and started on heparin infusion.  Transferred here for further  management. On arrival to [facility], he was without severe chest pain   Hospital Course     Chest pain with mildly elevated troponin: presented with chest pain was initially concerning for possible ACS, mildly elevated troponin 35-39. Started on IV heparin and transferred to [facility] for further management. No recurrent chest pain, mostly sounds atypical in nature. IV heparin was stopped. Able to ambulate without recurrent chest pain. -- continue ASA, crestor, zetia, coerg, imdur and ranexa. Stopped Entresto, and switched to losartan '25mg'$  daily  -- echo pending   CAD: s/p stenting to LAD/LCx patent on cath 02/2021. Nonobs disease in the RCA, diffusely diseased dLAD -- on ASA, statin, zetia, coreg, Imdur, ranexa, losartan   ICM/HFrEF: s/p st Jude CRT-D. Known LVEF of 35-40% 02/2021, BNP 59. Actually reported feeling more dehydrated on admission and blood pressure were soft. 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 With his softer blood pressures, will stop his Entresto. Start on losartan '25mg'$  daily -- resume lasix '40mg'$  daily with K+ supplement at discharge    HTN: blood pressures soft but stable -- continue coreg, as above switching to losartan at DC   HLD: LDL 96, Trig 244 -- on Crestor and Zetia  -- will refer to lipid clinic at discharge    DM: Hgb A1c 10.7 -- continue on metformin and farxiga  -- needs better glycemic control as an outpatient    Freq PVCs/VT: continue on mexiletine   Patient was seen by Dr. Gwenlyn Found and deemed stable for discharge home. Follow up arranged in the office. Medications sent to the [facility] pharmacy prior to DC. Educated by PharmD.   Did the patient have an acute coronary syndrome (MI, NSTEMI, STEMI, etc) this admission?:  No                               Did the patient  have a percutaneous coronary intervention (stent / angioplasty)?:  No.      The patient will be scheduled for a TOC follow up appointment in 10-14 days.  A message has been sent to the [facility] and Scheduling Pool at the office where the patient should be seen for follow up.  _____________  Discharge Vitals Blood pressure 107/80, pulse 86, temperature 98.2 F (36.8 C), temperature source Oral, resp. rate 17, height '6\' 4"'$  (1.93 m), weight (!) 155 kg, SpO2 98 %.  Filed Weights   11/27/21 2258 11/28/21 0209 11/29/21 0428  Weight: (!) 156.9 kg (!) 156.9 kg (!) 155 kg    Labs & Radiologic Studies    CBC Recent Labs    11/28/21 0030 11/29/21 0142  WBC 4.2 4.0  HGB 15.0 14.0  HCT 43.4 40.6  MCV 91.8 92.9  PLT 178 297   Basic Metabolic Panel Recent Labs    11/27/21 1240 11/28/21 0030 11/28/21 0044  NA 134* 137  --   K 4.3 3.6  --   CL 100 101  --   CO2 26 26  --   GLUCOSE 318* 257*  --  BUN 15 11  --   CREATININE 1.11 0.98  --   CALCIUM 8.4* 8.4*  --   MG  --   --  2.0   Liver Function Tests Recent Labs    11/27/21 1240  AST 29  ALT 41  ALKPHOS 86  BILITOT 0.9  PROT 7.2  ALBUMIN 3.6   Recent Labs    11/27/21 1240  LIPASE 37   High Sensitivity Troponin:   Recent Labs  Lab 11/27/21 1240 11/27/21 1455  TROPONINIHS 35* 39*    BNP Invalid input(s): "POCBNP" D-Dimer No results for input(s): "DDIMER" in the last 72 hours. Hemoglobin A1C Recent Labs    11/29/21 0142  HGBA1C 10.7*   Fasting Lipid Panel Recent Labs    11/28/21 0030  CHOL 172  HDL 27*  LDLCALC 96  TRIG 244*  CHOLHDL 6.4   Thyroid Function Tests No results for input(s): "TSH", "T4TOTAL", "T3FREE", "THYROIDAB" in the last 72 hours.  Invalid input(s): "FREET3" _____________  ECHOCARDIOGRAM COMPLETE  Result Date: 11/28/2021    ECHOCARDIOGRAM REPORT   Patient Name:   LATASHA PUSKAS Date of Exam: 11/28/2021 Medical Rec #:  [id number]      Height:       76.0 in Accession #:     [id number]     Weight:       345.9 lb Date of Birth:  Jan 01, 1971       BSA:          2.796 m Patient Age:    51 years       BP:           110/79 mmHg Patient Gender: M              HR:           98 bpm. Exam Location:  Inpatient Procedure: 2D Echo, Color Doppler, Cardiac Doppler and Intracardiac            Opacification Agent Indications:    Acute ischemic heart disease, unspecified I24.9  History:        Patient has prior history of Echocardiogram examinations, most                 recent 03/06/2021. Idiopathic CMP and CHF, CAD, Arrythmias:RBBB,                 Signs/Symptoms:Shortness of Breath; Risk Factors:Sleep Apnea,                 GERD, Hypertension and Diabetes.  Sonographer:    Bernadene Person RDCS Referring Phys: [id number] DENNIS NARCISSE JR  Sonographer Comments: Technically difficult study due to poor echo windows and patient is morbidly obese. IMPRESSIONS  1. Very challenging windows. Left ventricular ejection fraction, by estimation, is 30 to 35%. The left ventricle has moderately decreased function. The left ventricle demonstrates global hypokinesis. The left ventricular internal cavity size was moderately dilated. There is mild left ventricular hypertrophy. Left ventricular diastolic parameters are indeterminate.  2. Right ventricular systolic function is moderately reduced. The right ventricular size is normal. There is normal pulmonary artery systolic pressure.  3. No evidence of mitral valve regurgitation.  4. The aortic valve is grossly normal. Aortic valve regurgitation is not visualized.  5. Aortic no significant ascending aortic aneurysm. Conclusion(s)/Recommendation(s): Compared to prior study LV/RV function has worsened. FINDINGS  Left Ventricle: Very challenging windows. Left ventricular ejection fraction, by estimation, is 30 to 35%. The left ventricle has moderately decreased function. The left ventricle demonstrates global  hypokinesis. Definity contrast agent was given IV to delineate the  left ventricular endocardial borders. The left ventricular internal cavity size was moderately dilated. There is mild left ventricular hypertrophy. Left ventricular diastolic parameters are indeterminate. Right Ventricle: The right ventricular size is normal. Right ventricular systolic function is moderately reduced. There is normal pulmonary artery systolic pressure. The tricuspid regurgitant velocity is 2.20 m/s, and with an assumed right atrial pressure of 8 mmHg, the estimated right ventricular systolic pressure is 25.3 mmHg. Left Atrium: Left atrial size was normal in size. Right Atrium: Right atrial size was normal in size. Pericardium: There is no evidence of pericardial effusion. Mitral Valve: No evidence of mitral valve regurgitation. Tricuspid Valve: Tricuspid valve regurgitation is not demonstrated. Aortic Valve: The aortic valve is grossly normal. Aortic valve regurgitation is not visualized. Pulmonic Valve: Pulmonic valve regurgitation is not visualized. Aorta: No significant ascending aortic aneurysm. IAS/Shunts: The interatrial septum was not well visualized.  LEFT VENTRICLE PLAX 2D LVIDd:         7.30 cm LVIDs:         5.90 cm LV PW:         1.30 cm LV IVS:        1.20 cm LVOT diam:     2.30 cm LV SV:         53 LV SV Index:   19 LVOT Area:     4.15 cm  LV Volumes (MOD) LV vol d, MOD A2C: 86.9 ml LV vol d, MOD A4C: 140.0 ml LV vol s, MOD A2C: 57.4 ml LV vol s, MOD A4C: 97.8 ml LV SV MOD A2C:     29.5 ml LV SV MOD A4C:     140.0 ml LV SV MOD BP:      34.8 ml RIGHT VENTRICLE RV S prime:     6.98 cm/s TAPSE (M-mode): 1.6 cm LEFT ATRIUM             Index        RIGHT ATRIUM           Index LA diam:        4.90 cm 1.75 cm/m   RA Area:     17.30 cm LA Vol (A2C):   53.6 ml 19.17 ml/m  RA Volume:   50.40 ml  18.03 ml/m LA Vol (A4C):   92.0 ml 32.90 ml/m LA Biplane Vol: 76.5 ml 27.36 ml/m  AORTIC VALVE LVOT Vmax:   75.47 cm/s LVOT Vmean:  48.867 cm/s LVOT VTI:    0.128 m  AORTA Ao Root diam: 3.70 cm Ao  Asc diam:  3.40 cm MR Peak grad: 70.9 mmHg   TRICUSPID VALVE MR Mean grad: 44.0 mmHg   TR Peak grad:   19.4 mmHg MR Vmax:      421.00 cm/s TR Vmax:        220.00 cm/s MR Vmean:     313.0 cm/s                           SHUNTS                           Systemic VTI:  0.13 m                           Systemic Diam: 2.30 cm Phineas Inches Electronically signed by Phineas Inches Signature Date/Time: 11/28/2021/11:23:04  AM    Final    CT Angio Chest PE W and/or Wo Contrast  Result Date: 11/27/2021 CLINICAL DATA:  Tachycardia and left-sided pleuritic chest pain. EXAM: CT ANGIOGRAPHY CHEST WITH CONTRAST TECHNIQUE: Multidetector CT imaging of the chest was performed using the standard protocol during bolus administration of intravenous contrast. Multiplanar CT image reconstructions and MIPs were obtained to evaluate the vascular anatomy. RADIATION DOSE REDUCTION: This exam was performed according to the departmental dose-optimization program which includes automated exposure control, adjustment of the mA and/or kV according to patient size and/or use of iterative reconstruction technique. CONTRAST:  74m OMNIPAQUE IOHEXOL 350 MG/ML SOLN COMPARISON:  12/20/2018. FINDINGS: Cardiovascular: Central pulmonary arteries are well opacified. There is heterogeneous contrast opacification of small are segmental and subsegmental vessels mildly limiting the study. Allowing for this, there is no evidence of a pulmonary embolism. Mild enlargement of the cardiopericardial silhouette. Stable cardioverter-defibrillator. Coronary artery calcifications. No pericardial effusion. Great vessels are normal in caliber. No aortic dissection or atherosclerosis. Widely patent aortic arch branch vessels. Mediastinum/Nodes: No enlarged mediastinal, hilar, or axillary lymph nodes. Thyroid gland, trachea, and esophagus demonstrate no significant findings. Lungs/Pleura: Small ill-defined nodule, right lower lobe, abutting the oblique fissure, 3-4 mm, unchanged  from 2020, benign. No follow-up recommended. Tiny subpleural calcified granuloma right lower lobe. Remainder of the lungs is clear. No pleural effusion or pneumothorax. Upper Abdomen: No acute findings. Musculoskeletal: No fracture or acute finding. No bone lesion. No chest wall mass. Review of the MIP images confirms the above findings. IMPRESSION: 1. No evidence of a pulmonary embolism. 2. No acute findings. 3. Mild cardiomegaly and coronary artery calcifications. Electronically Signed   By: DLajean ManesM.D.   On: 11/27/2021 13:51   DG Chest 2 View  Result Date: 11/27/2021 CLINICAL DATA:  Pt complains of dehydration, body cramps, and chest pain x a few days. States he has been trying to drink a lot of liquid IV to rehydrate but has resulted in vomiting. Blood pressure fluctuates. Denies sob. Nonsmoker. Hx of MI in 2020. Has 5 stents in heart. Has pacemaker. EXAM: CHEST - 2 VIEW COMPARISON:  03/04/2021 and older studies. FINDINGS: Cardiac silhouette is borderline enlarged. Stable left anterior chest wall biventricular cardioverter-defibrillator. Normal mediastinal and hilar contours. Clear lungs.  No pleural effusion or pneumothorax. Skeletal structures are intact. IMPRESSION: No active cardiopulmonary disease. Electronically Signed   By: DLajean ManesM.D.   On: 11/27/2021 12:37    Disposition   Pt is being discharged home today in good condition.  Follow-up Plans & Appointments     Follow-up Information     KPark Liter MD Follow up.   Specialty: Cardiology Why: Office will call you with a follow up appt Contact information: [phone]         CCyndi Bender PA-C Follow up.   Specialty: Physician Assistant Why: Please follow up regarding ongoing management of your diabetes!!! Contact information: [phone]               Discharge Instructions     AMB Referral to Advanced Lipid  Disorders Clinic   Complete by: As directed    Internal Lipid Clinic Referral Scheduling  Internal lipid clinic referrals are [facility] who wish to refer established patients for routine management (help in starting PCSK9 inhibitor therapy) or advanced therapies.  Internal MD referral criteria:  1. All patients with LDL>190 mg/dL  2. All patients with Triglycerides >500 mg/dL  3. Patients with suspected or confirmed heterozygous familial hyperlipidemia (HeFH) or homozygous familial hyperlipidemia (HoFH)  4. Patients with family history of suspicious for genetic dyslipidemia desiring genetic testing  5. Patients refractory to standard guideline based therapy  6. Patients with statin intolerance (failed 2 statins, one of which must be a high potency statin)  7. Patients who the provider desires to be seen by MD   Internal PharmD referral criteria:   1. Follow-up patients for medication management  2. Follow-up for compliance monitoring  3. Patients for drug education  4. Patients with statin intolerance  5. PCSK9 inhibitor education and prior authorization approvals  6. Patients with triglycerides <500 mg/dL  External Lipid Clinic Referral  External lipid clinic referrals are for providers outside of [facility], considered new clinic patients - automatically routed to MD schedule   Diet - low sodium heart healthy   Complete by: As directed    Discharge instructions   Complete by: As directed    For patients with congestive heart failure, we give them these special instructions:  1. Follow a low-salt diet and watch your fluid intake. In general, you should not be taking in more than 2 liters of fluid per day (no more than 8 glasses per day). Some patients are restricted to less than 1.5 liters of fluid per day (no more than 6 glasses per day). This includes sources of water in foods like soup, coffee, tea, milk, etc. 2. Weigh yourself on the same scale at  same time of day and keep a log. 3. Call your doctor: (Anytime you feel any of the following symptoms)  - 3-4 pound weight gain in 1-2 days or 2 pounds overnight  - Shortness of breath, with or without a dry hacking cough  - Swelling in the hands, feet or stomach  - If you have to sleep on extra pillows at night in order to breathe   IT IS IMPORTANT TO LET YOUR DOCTOR KNOW EARLY ON IF YOU ARE HAVING SYMPTOMS SO WE CAN HELP YOU!   Increase activity slowly   Complete by: As directed        Discharge Medications   Allergies as of 11/29/2021   No Known Allergies      Medication List     STOP taking these medications    Entresto 24-26 MG Generic drug: sacubitril-valsartan   spironolactone 25 MG tablet Commonly known as: ALDACTONE       TAKE these medications    acetaminophen 500 MG tablet Commonly known as: TYLENOL Take 1,000 mg by mouth every 6 (six) hours as needed for moderate pain or headache.   allopurinol 100 MG tablet Commonly known as: ZYLOPRIM Take 100 mg by mouth daily as needed (for gout).   aspirin EC 81 MG tablet Take 1 tablet (81 mg total) by mouth daily. What changed: when to take this   BLINK TEARS OP Place 2 drops into both eyes 4 (four) times daily as needed (for dry eyes).   carvedilol 12.5 MG tablet Commonly known as: COREG Take 1 tablet (12.5 mg total) by mouth 2 (two) times daily.   colchicine 0.6 MG tablet Take 0.6 mg by mouth daily as needed (Gout).   dapagliflozin propanediol 10 MG Tabs tablet Commonly known as: Farxiga Take 1 tablet (10 mg total) by mouth daily.   escitalopram 10 MG tablet Commonly known as: LEXAPRO Take 10 mg  by mouth every morning.   ezetimibe 10 MG tablet Commonly known as: ZETIA TAKE 1 TABLET BY MOUTH EVERY DAY   furosemide 40 MG tablet Commonly known as: LASIX Take 1 tablet (40 mg total) by mouth daily.   gabapentin 100 MG capsule Commonly known as: NEURONTIN Take 100 mg by mouth 2 (two) times  daily.   ibuprofen 200 MG tablet Commonly known as: ADVIL Take 800 mg by mouth every 8 (eight) hours as needed for headache or moderate pain.   indomethacin 50 MG capsule Commonly known as: INDOCIN Take 50 mg by mouth daily as needed for mild pain or moderate pain (Gout).   isosorbide mononitrate 30 MG 24 hr tablet Commonly known as: IMDUR Take 1 tablet (30 mg total) by mouth daily.   losartan 25 MG tablet Commonly known as: COZAAR Take 1 tablet (25 mg total) by mouth daily.   metFORMIN 500 MG 24 hr tablet Commonly known as: GLUCOPHAGE-XR Take 2 tablets (1,000 mg total) by mouth in the morning and at bedtime.   mexiletine 200 MG capsule Commonly known as: MEXITIL Take 1 capsule (200 mg total) by mouth 2 (two) times daily. Please make overdue appt with Dr. Curt Bears before anymore refills. Thank you 3rd and Final Attempt   nitroGLYCERIN 0.4 MG SL tablet Commonly known as: NITROSTAT Place 0.4 mg under the tongue every 5 (five) minutes as needed for chest pain.   omeprazole 40 MG capsule Commonly known as: PRILOSEC Take 1 capsule (40 mg total) by mouth daily. What changed: when to take this   potassium chloride SA 20 MEQ tablet Commonly known as: KLOR-CON M Take 1 tablet (20 mEq total) by mouth daily.   ranolazine 500 MG 12 hr tablet Commonly known as: RANEXA Take 500 mg by mouth 2 (two) times daily.   rosuvastatin 40 MG tablet Commonly known as: CRESTOR Take 1 tablet (40 mg total) by mouth every evening.           Outstanding Labs/Studies   Lipid clinic referral placed at DC  Duration of Discharge Encounter   Greater than 30 minutes including physician time.  Signed, Reino Bellis, NP 11/29/2021, 9:07 AM   Agree with note by Reino Bellis NP-C  Patient stable for discharge.  Her troponins were low and flat.  His 2D echo revealed a slight decline in his EF down to 30% with moderate RV dysfunction but normal pulmonary artery pressures.  He feels  clinically stable.  He denies chest pain.  We will change his Entresto to low-dose losartan and arrange follow-up with Dr. Agustin Cree as an outpatient.  Lorretta Harp, M.D., [place], [facility], Laverta [place] Lilly [address] [place], [place]  [id number]  [phone] 11/29/2021 9:48 AM

## 2021-12-01 ENCOUNTER — Ambulatory Visit: Payer: 59

## 2021-12-28 ENCOUNTER — Other Ambulatory Visit: Payer: Self-pay | Admitting: Cardiology

## 2022-01-02 ENCOUNTER — Telehealth: Payer: Self-pay | Admitting: Physician Assistant

## 2022-01-02 NOTE — Telephone Encounter (Signed)
   The patient called the answering service after-hours today. States he called last night as he was having nausea and diarrhea. He was originally going to take Pepto but called answering service last night and was advised to hold off since he is on blood thinners. He states he was told that if symptoms persisted he should call back this AM and they would call something in for his symptoms. Still having nausea and diarrhea. Do not necessarily think this should be tx over the phone by cardiology team, recommended he touch base with his primary doctor or urgent care for eval. The patient verbalized understanding and gratitude.  Charlie Pitter, PA-C

## 2022-01-11 ENCOUNTER — Ambulatory Visit: Payer: 59

## 2022-01-11 NOTE — Progress Notes (Deleted)
Patient ID: Joe [name]                 DOB: 16-May-1971                    MRN: [id number]     HPI: Joe [name] is a 51 y.o. male patient referred to lipid clinic by Dr Gwenlyn Found. PMH is significant for CAD, uncontrolled T2DM, obesity, CHF, HTN, angina, and OSA.    Current Medications:  Intolerances:  Risk Factors:  LDL goal:   Diet:   Exercise:   Family History:   Social History:   Labs:  Past Medical History:  Diagnosis Date   Abnormal EKG    Acute systolic heart failure ([place]) 09/06/2017   Angina pectoris ([place]) 09/05/2017   Body mass index 45.0-49.9, adult (HCC)    CAD S/P percutaneous coronary angioplasty 09/07/2017   mLAD and dLAD PCI with DES 09/06/17   CHF (congestive heart failure) (HCC)    Chronic systolic (congestive) heart failure ([place]) 9/37/1696   Complication of anesthesia 1995   "had hard time waking up; breathing w/vasectomy"   Coronary artery disease    Erectile dysfunction    Essential hypertension, benign    Fatigue    GERD (gastroesophageal reflux disease)    Gout    High cholesterol    "just started tx yesterday" (09/06/2017)   History of gout    "RX prn" (09/06/2017)   Ischemic cardiomyopathy 09/07/2017   EF 25-35%   Muscular chest pain    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   Below Left Nare (MOH's)   Obesity    Obstructive sleep apnea    OSA on CPAP    Plantar fasciitis    Pre-operative clearance 02/11/2018   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/02/2019   Saint Jude   Right bundle branch block    Shortness of breath 09/05/2017   Testosterone deficiency    Type 2 diabetes mellitus with complication, without long-term current use of insulin ([place]) 09/05/2017   Type II diabetes mellitus ([place])    "started tx 08/28/2017"    Current Outpatient Medications on File Prior to Visit  Medication Sig Dispense Refill   acetaminophen (TYLENOL) 500 MG tablet Take 1,000 mg by mouth every 6 (six) hours as needed for  moderate pain or headache.     allopurinol (ZYLOPRIM) 100 MG tablet Take 100 mg by mouth daily as needed (for gout).      aspirin EC 81 MG tablet Take 1 tablet (81 mg total) by mouth daily. (Patient taking differently: Take 81 mg by mouth every morning.) 90 tablet 3   carvedilol (COREG) 12.5 MG tablet Take 1 tablet (12.5 mg total) by mouth 2 (two) times daily. 180 tablet 2   colchicine 0.6 MG tablet Take 0.6 mg by mouth daily as needed (Gout).     dapagliflozin propanediol (FARXIGA) 10 MG TABS tablet Take 1 tablet (10 mg total) by mouth daily. 90 tablet 2   escitalopram (LEXAPRO) 10 MG tablet Take 10 mg by mouth every morning.     ezetimibe (ZETIA) 10 MG tablet TAKE 1 TABLET BY MOUTH EVERY DAY (Patient not taking: Reported on 11/28/2021) 90 tablet 1   furosemide (LASIX) 40 MG tablet Take 1 tablet (40 mg total) by mouth daily. 90 tablet 1   gabapentin (NEURONTIN) 100 MG capsule Take 100 mg by mouth 2 (two) times daily.     ibuprofen (ADVIL) 200 MG tablet Take 800 mg by mouth  every 8 (eight) hours as needed for headache or moderate pain.     indomethacin (INDOCIN) 50 MG capsule Take 50 mg by mouth daily as needed for mild pain or moderate pain (Gout).     isosorbide mononitrate (IMDUR) 30 MG 24 hr tablet Take 1 tablet (30 mg total) by mouth daily. 90 tablet 3   losartan (COZAAR) 25 MG tablet Take 1 tablet (25 mg total) by mouth daily. 30 tablet 11   metFORMIN (GLUCOPHAGE-XR) 500 MG 24 hr tablet Take 2 tablets (1,000 mg total) by mouth in the morning and at bedtime.     mexiletine (MEXITIL) 200 MG capsule Take 1 capsule (200 mg total) by mouth 2 (two) times daily. Please make overdue appt with Dr. Curt Bears before anymore refills. Thank you 3rd and Final Attempt 180 capsule 3   nitroGLYCERIN (NITROSTAT) 0.4 MG SL tablet Place 0.4 mg under the tongue every 5 (five) minutes as needed for chest pain.     omeprazole (PRILOSEC) 40 MG capsule Take 1 capsule (40 mg total) by mouth daily. (Patient taking  differently: Take 40 mg by mouth every morning.) 90 capsule 1   Polyethylene Glycol 400 (BLINK TEARS OP) Place 2 drops into both eyes 4 (four) times daily as needed (for dry eyes).     potassium chloride SA (KLOR-CON M) 20 MEQ tablet Take 1 tablet (20 mEq total) by mouth daily. 90 tablet 3   ranolazine (RANEXA) 500 MG 12 hr tablet Take 500 mg by mouth 2 (two) times daily.     rosuvastatin (CRESTOR) 40 MG tablet Take 1 tablet (40 mg total) by mouth every evening. 90 tablet 3   No current facility-administered medications on file prior to visit.    No Known Allergies  Assessment/Plan:  1. Hyperlipidemia -

## 2022-01-16 ENCOUNTER — Ambulatory Visit (INDEPENDENT_AMBULATORY_CARE_PROVIDER_SITE_OTHER): Payer: 59

## 2022-01-16 ENCOUNTER — Other Ambulatory Visit (HOSPITAL_COMMUNITY): Payer: Self-pay

## 2022-01-16 ENCOUNTER — Telehealth: Payer: Self-pay | Admitting: Orthopedic Surgery

## 2022-01-16 DIAGNOSIS — I255 Ischemic cardiomyopathy: Secondary | ICD-10-CM

## 2022-01-16 DIAGNOSIS — I5022 Chronic systolic (congestive) heart failure: Secondary | ICD-10-CM | POA: Diagnosis not present

## 2022-01-16 LAB — CUP PACEART REMOTE DEVICE CHECK
Battery Remaining Longevity: 48 mo
Battery Remaining Percentage: 59 %
Battery Voltage: 2.95 V
Brady Statistic AP VP Percent: 3.3 %
Brady Statistic AP VS Percent: 1 %
Brady Statistic AS VP Percent: 89 %
Brady Statistic AS VS Percent: 4.9 %
Brady Statistic RA Percent Paced: 1.4 %
Date Time Interrogation Session: 20230731034148
HighPow Impedance: 80 Ohm
HighPow Impedance: 80 Ohm
Implantable Lead Implant Date: 20200930
Implantable Lead Implant Date: 20200930
Implantable Lead Implant Date: 20200930
Implantable Lead Location: 753858
Implantable Lead Location: 753859
Implantable Lead Location: 753860
Implantable Pulse Generator Implant Date: 20200930
Lead Channel Impedance Value: 390 Ohm
Lead Channel Impedance Value: 490 Ohm
Lead Channel Impedance Value: 940 Ohm
Lead Channel Pacing Threshold Amplitude: 0.5 V
Lead Channel Pacing Threshold Amplitude: 0.75 V
Lead Channel Pacing Threshold Amplitude: 1 V
Lead Channel Pacing Threshold Pulse Width: 0.5 ms
Lead Channel Pacing Threshold Pulse Width: 0.5 ms
Lead Channel Pacing Threshold Pulse Width: 0.5 ms
Lead Channel Sensing Intrinsic Amplitude: 12 mV
Lead Channel Sensing Intrinsic Amplitude: 2.5 mV
Lead Channel Setting Pacing Amplitude: 2 V
Lead Channel Setting Pacing Amplitude: 2.5 V
Lead Channel Setting Pacing Amplitude: 2.5 V
Lead Channel Setting Pacing Pulse Width: 0.5 ms
Lead Channel Setting Pacing Pulse Width: 0.5 ms
Lead Channel Setting Sensing Sensitivity: 0.5 mV
Pulse Gen Serial Number: 9901124

## 2022-01-16 NOTE — Telephone Encounter (Signed)
Did not  need this enconter

## 2022-01-25 ENCOUNTER — Other Ambulatory Visit (HOSPITAL_COMMUNITY): Payer: Self-pay

## 2022-02-07 ENCOUNTER — Ambulatory Visit: Payer: 59 | Admitting: Cardiology

## 2022-02-07 ENCOUNTER — Telehealth: Payer: Self-pay | Admitting: Cardiology

## 2022-02-07 VITALS — BP 136/90 | HR 98 | Ht 75.0 in | Wt 353.4 lb

## 2022-02-07 DIAGNOSIS — E118 Type 2 diabetes mellitus with unspecified complications: Secondary | ICD-10-CM | POA: Diagnosis not present

## 2022-02-07 DIAGNOSIS — I2510 Atherosclerotic heart disease of native coronary artery without angina pectoris: Secondary | ICD-10-CM | POA: Diagnosis not present

## 2022-02-07 DIAGNOSIS — Z9861 Coronary angioplasty status: Secondary | ICD-10-CM

## 2022-02-07 DIAGNOSIS — I10 Essential (primary) hypertension: Secondary | ICD-10-CM | POA: Diagnosis not present

## 2022-02-07 DIAGNOSIS — I5022 Chronic systolic (congestive) heart failure: Secondary | ICD-10-CM

## 2022-02-07 DIAGNOSIS — E7800 Pure hypercholesterolemia, unspecified: Secondary | ICD-10-CM

## 2022-02-07 DIAGNOSIS — Z0279 Encounter for issue of other medical certificate: Secondary | ICD-10-CM

## 2022-02-07 DIAGNOSIS — G4733 Obstructive sleep apnea (adult) (pediatric): Secondary | ICD-10-CM

## 2022-02-07 NOTE — Telephone Encounter (Signed)
Patient brought disability forms to the office on 02/07/22 for Dr. Agustin Cree to complete and fax to [place] at [phone]. ROI has be signed. Forms placed in Dr. Wendy Poet box on 02/07/22. $29 cash payment was made and placed in safe. Original forms scanned to chart. Billing sheet faxed to Billing Dept./ [facility]

## 2022-02-07 NOTE — Patient Instructions (Signed)
Medication Instructions:  Your physician recommends that you continue on your current medications as directed. Please refer to the Current Medication list given to you today.  *If you need a refill on your cardiac medications before your next appointment, please call your pharmacy*   Lab Work: BMP - today If you have labs (blood work) drawn today and your tests are completely normal, you will receive your results only by: [place] (if you have MyChart) OR A paper copy in the mail If you have any lab test that is abnormal or we need to change your treatment, we will call you to review the results.   Testing/Procedures: None Ordered   Follow-Up: At [facility], you and your health needs are our priority.  As part of our continuing mission to provide you with exceptional heart care, we have created designated Provider Care Teams.  These Care Teams include your primary Cardiologist (physician) and Advanced Practice Providers (APPs -  Physician Assistants and Nurse Practitioners) who all work together to provide you with the care you need, when you need it.  We recommend signing up for the patient portal called "MyChart".  Sign up information is provided on this After Visit Summary.  MyChart is used to connect with patients for Virtual Visits (Telemedicine).  Patients are able to view lab/test results, encounter notes, upcoming appointments, etc.  Non-urgent messages can be sent to your provider as well.   To learn more about what you can do with MyChart, go to [email or web address].    Your next appointment:   3 month(s)  The format for your next appointment:   In Person  Provider:   Jenne Campus, MD    Other Instructions Referral made to CHF clinic - They will call to make appt

## 2022-02-07 NOTE — Progress Notes (Unsigned)
Cardiology Office Note:    Date:  02/07/2022   ID:  Joe [name], DOB 1971/06/09, MRN [id number]  PCP:  Cyndi Bender, PA-C  Cardiologist:  Jenne Campus, MD    Referring MD: Cyndi Bender, PA-C   Chief Complaint  Patient presents with   Follow-up  Doing better  History of Present Illness:    Joe [name] is a 51 y.o. male   with past medical history significant for coronary artery disease in March 2019 he required stenting to mid to distal LAD after that he was doing quite nicely but then started getting symptoms again did have a cardiac catheterization in January 2021 which showed 40% stenosis of distal LAD stents were patent, mid RCA had about 25% lesion PDA also got to 50% stenosis.  Circumflex was normal.  At the same time he was noted to have diminished left ventricle ejection fraction with echocardiogram showing ejection fraction 20 to 25%.  Eventually on March 19, 2019 he did receive CRT-D.  After that echocardiogram performed showed ejection fraction 35 to 40% In January 2022 he did have a cardiac catheterization for atypical chest pain however did not show any target lesion for interventions. Recently in June he ended up getting to the hospital because of atypical chest pain.  His troponin was elevated but flat.  His ejection fraction at that time was found to be 30 to 35%.  His blood pressure was very soft and his Delene Loll has been discontinued.  He was put on losartan. He is in my office today.  Overall he seems to be doing well.  Described to have however some bad days when he is tired exhausted simply does not have energy.  Denies have any more chest pain tightness squeezing pressure burning chest except for some rare occasions.  Past Medical History:  Diagnosis Date   Abnormal EKG    Acute systolic heart failure ([place]) 09/06/2017   Angina pectoris ([place]) 09/05/2017   Body mass index 45.0-49.9, adult (HCC)    CAD S/P percutaneous coronary angioplasty 09/07/2017    mLAD and dLAD PCI with DES 09/06/17   CHF (congestive heart failure) (HCC)    Chronic systolic (congestive) heart failure ([place]) 4/69/6295   Complication of anesthesia 1995   "had hard time waking up; breathing w/vasectomy"   Coronary artery disease    Erectile dysfunction    Essential hypertension, benign    Fatigue    GERD (gastroesophageal reflux disease)    Gout    High cholesterol    "just started tx yesterday" (09/06/2017)   History of gout    "RX prn" (09/06/2017)   Ischemic cardiomyopathy 09/07/2017   EF 25-35%   Muscular chest pain    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   Below Left Nare (MOH's)   Obesity    Obstructive sleep apnea    OSA on CPAP    Plantar fasciitis    Pre-operative clearance 02/11/2018   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/02/2019   Saint Jude   Right bundle branch block    Shortness of breath 09/05/2017   Testosterone deficiency    Type 2 diabetes mellitus with complication, without long-term current use of insulin ([place]) 09/05/2017   Type II diabetes mellitus ([place])    "started tx 08/28/2017"    Past Surgical History:  Procedure Laterality Date   BACK SURGERY     BIV ICD INSERTION CRT-D N/A 03/19/2019   Procedure: BIV ICD INSERTION CRT-D;  Surgeon: Constance Haw, MD;  Location: [facility];  Service: Cardiovascular;  Laterality: N/A;   CORONARY ANGIOPLASTY WITH STENT PLACEMENT  09/06/2017   "3 stents"   CORONARY STENT INTERVENTION N/A 09/06/2017   Procedure: CORONARY STENT INTERVENTION;  Surgeon: Jettie Booze, MD;  Location: [facility];  Service: Cardiovascular;  Laterality: N/A;   CORONARY STENT INTERVENTION N/A 02/15/2018   Procedure: CORONARY STENT INTERVENTION;  Surgeon: Jettie Booze, MD;  Location: [facility];  Service: Cardiovascular;  Laterality: N/A;   KNEE ARTHROSCOPY Right    LEFT HEART CATH AND CORONARY ANGIOGRAPHY N/A 09/06/2017   Procedure: LEFT HEART  CATH AND CORONARY ANGIOGRAPHY;  Surgeon: Jettie Booze, MD;  Location: [facility];  Service: Cardiovascular;  Laterality: N/A;   LEFT HEART CATH AND CORONARY ANGIOGRAPHY N/A 02/15/2018   Procedure: LEFT HEART CATH AND CORONARY ANGIOGRAPHY;  Surgeon: Jettie Booze, MD;  Location: [facility];  Service: Cardiovascular;  Laterality: N/A;   LEFT HEART CATH AND CORONARY ANGIOGRAPHY N/A 06/27/2018   Procedure: LEFT HEART CATH AND CORONARY ANGIOGRAPHY;  Surgeon: Jettie Booze, MD;  Location: [facility];  Service: Cardiovascular;  Laterality: N/A;   LEFT HEART CATH AND CORONARY ANGIOGRAPHY N/A 06/28/2020   Procedure: LEFT HEART CATH AND CORONARY ANGIOGRAPHY;  Surgeon: Martinique, Peter M, MD;  Location: [facility];  Service: Cardiovascular;  Laterality: N/A;   LUMBAR SPINE SURGERY  ~ 2001   Intradiscal Electrothermoplasty   RIGHT/LEFT HEART CATH AND CORONARY ANGIOGRAPHY N/A 03/07/2021   Procedure: RIGHT/LEFT HEART CATH AND CORONARY ANGIOGRAPHY;  Surgeon: Larey Dresser, MD;  Location: [facility];  Service: Cardiovascular;  Laterality: N/A;   VASECTOMY  1995    Current Medications: Current Meds  Medication Sig   acetaminophen (TYLENOL) 500 MG tablet Take 1,000 mg by mouth every 6 (six) hours as needed for moderate pain or headache.   allopurinol (ZYLOPRIM) 100 MG tablet Take 100 mg by mouth daily as needed (for gout).    aspirin EC 81 MG tablet Take 1 tablet (81 mg total) by mouth daily. (Patient taking differently: Take 81 mg by mouth every morning.)   carvedilol (COREG) 12.5 MG tablet Take 1 tablet (12.5 mg total) by mouth 2 (two) times daily.   colchicine 0.6 MG tablet Take 0.6 mg by mouth daily as needed (Gout).   dapagliflozin propanediol (FARXIGA) 10 MG TABS tablet Take 1 tablet (10 mg total) by mouth daily.   escitalopram (LEXAPRO) 10 MG tablet Take 10 mg by mouth every morning.   ezetimibe (ZETIA) 10 MG tablet TAKE 1 TABLET BY MOUTH EVERY  DAY (Patient taking differently: Take 10 mg by mouth daily.)   furosemide (LASIX) 40 MG tablet Take 1 tablet (40 mg total) by mouth daily.   gabapentin (NEURONTIN) 100 MG capsule Take 100 mg by mouth 2 (two) times daily.   ibuprofen (ADVIL) 200 MG tablet Take 800 mg by mouth every 8 (eight) hours as needed for headache or moderate pain.   indomethacin (INDOCIN) 50 MG capsule Take 50 mg by mouth daily as needed for mild pain or moderate pain (Gout).   isosorbide mononitrate (IMDUR) 30 MG 24 hr tablet Take 1 tablet (30 mg total) by mouth daily.   losartan (COZAAR) 25 MG tablet Take 1 tablet (25 mg total) by mouth daily.   metFORMIN (GLUCOPHAGE-XR) 500 MG 24 hr tablet Take 2 tablets (1,000 mg total) by mouth in the morning and at bedtime.   mexiletine (  MEXITIL) 200 MG capsule Take 1 capsule (200 mg total) by mouth 2 (two) times daily. Please make overdue appt with Dr. Curt Bears before anymore refills. Thank you 3rd and Final Attempt   nitroGLYCERIN (NITROSTAT) 0.4 MG SL tablet Place 0.4 mg under the tongue every 5 (five) minutes as needed for chest pain.   omeprazole (PRILOSEC) 40 MG capsule Take 1 capsule (40 mg total) by mouth daily. (Patient taking differently: Take 40 mg by mouth every morning.)   Polyethylene Glycol 400 (BLINK TEARS OP) Place 2 drops into both eyes 4 (four) times daily as needed (for dry eyes).   potassium chloride SA (KLOR-CON M) 20 MEQ tablet Take 1 tablet (20 mEq total) by mouth daily.   ranolazine (RANEXA) 500 MG 12 hr tablet Take 500 mg by mouth 2 (two) times daily.   rosuvastatin (CRESTOR) 40 MG tablet Take 1 tablet (40 mg total) by mouth every evening.     Allergies:   Patient has no known allergies.   Social History   Socioeconomic History   Marital status: Married    Spouse name: Not on file   Number of children: Not on file   Years of education: Not on file   Highest education level: Not on file  Occupational History   Not on file  Tobacco Use   Smoking  status: Never   Smokeless tobacco: Never  Vaping Use   Vaping Use: Never used  Substance and Sexual Activity   Alcohol use: No   Drug use: Never   Sexual activity: Not on file  Other Topics Concern   Not on file  Social History Narrative   Not on file   Social Determinants of Health   Financial Resource Strain: Not on file  Food Insecurity: Not on file  Transportation Needs: Not on file  Physical Activity: Not on file  Stress: Not on file  Social Connections: Not on file     Family History: The patient's family history includes Brain cancer in his mother; Diabetes Mellitus II in his brother and paternal grandfather; Lung cancer in his mother; Lymphoma in his father. ROS:   Please see the history of present illness.    All 14 point review of systems negative except as described per history of present illness  EKGs/Labs/Other Studies Reviewed:      Recent Labs: 11/27/2021: ALT 41; B Natriuretic Peptide 59.2 11/28/2021: BUN 11; Creatinine, Ser 0.98; Magnesium 2.0; Potassium 3.6; Sodium 137 11/29/2021: Hemoglobin 14.0; Platelets 169  Recent Lipid Panel    Component Value Date/Time   CHOL 172 11/28/2021 0030   CHOL 103 03/21/2021 0923   TRIG 244 (H) 11/28/2021 0030   HDL 27 (L) 11/28/2021 0030   HDL 31 (L) 03/21/2021 0923   CHOLHDL 6.4 11/28/2021 0030   VLDL 49 (H) 11/28/2021 0030   LDLCALC 96 11/28/2021 0030   LDLCALC 34 03/21/2021 0923    Physical Exam:    VS:  BP (!) 136/90 (BP Location: Left Arm, Patient Position: Sitting)   Pulse 98   Ht '6\' 3"'$  (1.905 m)   Wt (!) 353 lb 6.4 oz (160.3 kg)   SpO2 93%   BMI 44.17 kg/m     Wt Readings from Last 3 Encounters:  02/07/22 (!) 353 lb 6.4 oz (160.3 kg)  11/29/21 (!) 341 lb 12.8 oz (155 kg)  10/05/21 (!) 354 lb 9.6 oz (160.8 kg)     GEN:  Well nourished, well developed in no acute distress HEENT: Normal NECK: No JVD;  No carotid bruits LYMPHATICS: No lymphadenopathy CARDIAC: RRR, no murmurs, no rubs, no  gallops RESPIRATORY:  Clear to auscultation without rales, wheezing or rhonchi  ABDOMEN: Soft, non-tender, non-distended MUSCULOSKELETAL:  No edema; No deformity  SKIN: Warm and dry LOWER EXTREMITIES: no swelling NEUROLOGIC:  Alert and oriented x 3 PSYCHIATRIC:  Normal affect   ASSESSMENT:    1. CAD S/P percutaneous coronary angioplasty   2. Chronic systolic (congestive) heart failure ([place])   3. Essential hypertension, benign   4. Type 2 diabetes mellitus with complication, without long-term current use of insulin ([place])   5. Obstructive sleep apnea   6. High cholesterol    PLAN:    In order of problems listed above:  Coronary disease last cardiac catheterization reviewed, showing nonobstructive target lesions for intervention, he is on antiplatelet therapy which I will continue. Cardiomyopathy with latest ejection fraction from 11/28/2021 of 30 to 35%.  He was unable to tolerate full dose of Entresto therefore he was switched to losartan, however, today his blood pressure is quite good.  I will check Chem-7 and if Chem-7 is fine I will start him back on Entresto.  He is also on beta-blocker in form of Coreg 12.5 twice daily, Farxiga pills we will check Chem-7 and see if there is any role for Aldactone.  I will refer him also to our advanced congestive heart failure clinic he started having issue with his blood pressure dropping and need to help with the management of this complex situation. Essential hypertension blood pressure elevated today, will do Chem-7 if Chem-7 is fine we will switch him from losartan to Entresto and potentially Aldactone Type 2 diabetes that being followed by internal medicine team, his last hemoglobin A1c however is very elevated 10.2.  He will require most likely endocrinologist to help help with the problem Dyslipidemia I did review K PN which show me data from 11/28/2021 with LDL of 96 HDL 27.  He is on Zetia 10 mg daily as well as Crestor 40.  We will make a  referral to lipid clinic to help with this problem.   Medication Adjustments/Labs and Tests Ordered: Current medicines are reviewed at length with the patient today.  Concerns regarding medicines are outlined above.  No orders of the defined types were placed in this encounter.  Medication changes: No orders of the defined types were placed in this encounter.   Signed, Park Liter, MD, [facility] 02/07/2022 9:48 AM    [place]

## 2022-02-08 LAB — BASIC METABOLIC PANEL
BUN/Creatinine Ratio: 12 (ref 9–20)
BUN: 10 mg/dL (ref 6–24)
CO2: 23 mmol/L (ref 20–29)
Calcium: 8.9 mg/dL (ref 8.7–10.2)
Chloride: 99 mmol/L (ref 96–106)
Creatinine, Ser: 0.86 mg/dL (ref 0.76–1.27)
Glucose: 252 mg/dL — ABNORMAL HIGH (ref 70–99)
Potassium: 4.3 mmol/L (ref 3.5–5.2)
Sodium: 136 mmol/L (ref 134–144)
eGFR: 105 mL/min/{1.73_m2} (ref >59)

## 2022-02-09 ENCOUNTER — Telehealth: Payer: Self-pay

## 2022-02-09 DIAGNOSIS — I10 Essential (primary) hypertension: Secondary | ICD-10-CM

## 2022-02-09 MED ORDER — ENTRESTO 24-26 MG PO TABS: 1.0000 | ORAL_TABLET | Freq: Two times a day (BID) | ORAL | 2 refills | Status: DC

## 2022-02-09 NOTE — Telephone Encounter (Signed)
-----   Message from Park Liter, MD sent at 02/09/2022  8:36 AM EDT ----- Chem-7 looks good, stop losartan start Entresto 24/26 twice daily, Chem-7 need to be done within a week

## 2022-02-09 NOTE — Telephone Encounter (Signed)
Patient notified of results and recommendations and agreed with plan. Rx sent, Med list updated, Expressed the importance of d/c losartan prior to start his new therapy. Lab order on file.

## 2022-02-15 ENCOUNTER — Telehealth: Payer: Self-pay

## 2022-02-15 NOTE — Telephone Encounter (Signed)
Short term disability forms completed and faxed.

## 2022-02-19 NOTE — Progress Notes (Signed)
Remote ICD transmission.   

## 2022-03-06 ENCOUNTER — Telehealth: Payer: Self-pay

## 2022-03-06 NOTE — Telephone Encounter (Signed)
Patient notified unable to complete FMLA forms per Dr. Raliegh Ip. Dr. Raliegh Ip advise to forward this to providers office who specialize in assessing physical disability. I provided the patient with location.

## 2022-03-08 ENCOUNTER — Telehealth: Payer: Self-pay | Admitting: Cardiology

## 2022-03-08 NOTE — Telephone Encounter (Signed)
Dr. Heath Gold requesting a peer to peer with Dr. Agustin Cree. He says he has been sent the patient's records to review if he can return to work. Phone: [phone].

## 2022-03-09 NOTE — Telephone Encounter (Signed)
Dr. Mayer Masker is following up, again requesting to speak with Dr. Agustin Cree for a peer-to-peer disability review to determine eligibility to return to work. Please return call to discuss at [phone].

## 2022-03-16 NOTE — Telephone Encounter (Signed)
Per Dr. Agustin Cree verbalized ok to complete forms until the patient is seen by our [facility]. Patient aware forms completed and he elected to pick up forms. Aware this will be available at the front desk.

## 2022-04-10 ENCOUNTER — Emergency Department (HOSPITAL_COMMUNITY): Payer: 59

## 2022-04-10 ENCOUNTER — Encounter (HOSPITAL_COMMUNITY): Payer: Self-pay | Admitting: Internal Medicine

## 2022-04-10 ENCOUNTER — Emergency Department (HOSPITAL_COMMUNITY)
Admission: EM | Admit: 2022-04-10 | Discharge: 2022-04-10 | Disposition: A | Discharge status: Home / Self Care | Payer: 59 | Attending: Emergency Medicine | Admitting: Emergency Medicine

## 2022-04-10 ENCOUNTER — Ambulatory Visit (HOSPITAL_BASED_OUTPATIENT_CLINIC_OR_DEPARTMENT_OTHER)
Admission: RE | Admit: 2022-04-10 | Discharge: 2022-04-10 | Disposition: A | Discharge status: Home / Self Care | Payer: 59 | Source: Ambulatory Visit | Attending: Internal Medicine | Admitting: Internal Medicine

## 2022-04-10 ENCOUNTER — Other Ambulatory Visit: Payer: Self-pay

## 2022-04-10 VITALS — BP 110/70 | HR 99 | Wt 353.0 lb

## 2022-04-10 DIAGNOSIS — Z7984 Long term (current) use of oral hypoglycemic drugs: Secondary | ICD-10-CM | POA: Insufficient documentation

## 2022-04-10 DIAGNOSIS — Z950 Presence of cardiac pacemaker: Secondary | ICD-10-CM | POA: Diagnosis not present

## 2022-04-10 DIAGNOSIS — I110 Hypertensive heart disease with heart failure: Secondary | ICD-10-CM | POA: Diagnosis not present

## 2022-04-10 DIAGNOSIS — I5023 Acute on chronic systolic (congestive) heart failure: Secondary | ICD-10-CM | POA: Diagnosis not present

## 2022-04-10 DIAGNOSIS — I2510 Atherosclerotic heart disease of native coronary artery without angina pectoris: Secondary | ICD-10-CM

## 2022-04-10 DIAGNOSIS — E1165 Type 2 diabetes mellitus with hyperglycemia: Secondary | ICD-10-CM | POA: Diagnosis not present

## 2022-04-10 DIAGNOSIS — I5022 Chronic systolic (congestive) heart failure: Secondary | ICD-10-CM

## 2022-04-10 DIAGNOSIS — R0602 Shortness of breath: Secondary | ICD-10-CM | POA: Diagnosis present

## 2022-04-10 DIAGNOSIS — Z7982 Long term (current) use of aspirin: Secondary | ICD-10-CM | POA: Diagnosis not present

## 2022-04-10 DIAGNOSIS — Z79899 Other long term (current) drug therapy: Secondary | ICD-10-CM | POA: Insufficient documentation

## 2022-04-10 DIAGNOSIS — I493 Ventricular premature depolarization: Secondary | ICD-10-CM | POA: Diagnosis not present

## 2022-04-10 DIAGNOSIS — G4733 Obstructive sleep apnea (adult) (pediatric): Secondary | ICD-10-CM

## 2022-04-10 LAB — BASIC METABOLIC PANEL
Anion gap: 10 (ref 5–15)
BUN: 9 mg/dL (ref 6–20)
CO2: 22 mmol/L (ref 22–32)
Calcium: 8.7 mg/dL — ABNORMAL LOW (ref 8.9–10.3)
Chloride: 103 mmol/L (ref 98–111)
Creatinine, Ser: 0.88 mg/dL (ref 0.61–1.24)
GFR, Estimated: >60 mL/min (ref >60)
Glucose, Bld: 266 mg/dL — ABNORMAL HIGH (ref 70–99)
Potassium: 4 mmol/L (ref 3.5–5.1)
Sodium: 135 mmol/L (ref 135–145)

## 2022-04-10 LAB — URINALYSIS, ROUTINE W REFLEX MICROSCOPIC
Bilirubin Urine: NEGATIVE
Glucose, UA: >=500 mg/dL — AB
Hgb urine dipstick: NEGATIVE
Ketones, ur: NEGATIVE mg/dL
Leukocytes,Ua: NEGATIVE
Nitrite: NEGATIVE
Protein, ur: NEGATIVE mg/dL
Specific Gravity, Urine: 1.025 (ref 1.005–1.030)
pH: 6 (ref 5.0–8.0)

## 2022-04-10 LAB — CBC
HCT: 44.8 % (ref 39.0–52.0)
Hemoglobin: 14.9 g/dL (ref 13.0–17.0)
MCH: 30.6 pg (ref 26.0–34.0)
MCHC: 33.3 g/dL (ref 30.0–36.0)
MCV: 92 fL (ref 80.0–100.0)
Platelets: 233 10*3/uL (ref 150–400)
RBC: 4.87 MIL/uL (ref 4.22–5.81)
RDW: 12.5 % (ref 11.5–15.5)
WBC: 7.4 10*3/uL (ref 4.0–10.5)
nRBC: 0 % (ref 0.0–0.2)

## 2022-04-10 LAB — TROPONIN I (HIGH SENSITIVITY)
Troponin I (High Sensitivity): 37 ng/L — ABNORMAL HIGH (ref <18)
Troponin I (High Sensitivity): 47 ng/L — ABNORMAL HIGH (ref <18)

## 2022-04-10 LAB — URINALYSIS, MICROSCOPIC (REFLEX)
Bacteria, UA: NONE SEEN
RBC / HPF: NONE SEEN RBC/hpf (ref 0–5)
WBC, UA: NONE SEEN WBC/hpf (ref 0–5)

## 2022-04-10 LAB — BRAIN NATRIURETIC PEPTIDE: B Natriuretic Peptide: 172.3 pg/mL — ABNORMAL HIGH (ref 0.0–100.0)

## 2022-04-10 MED ORDER — ACETAMINOPHEN 325 MG PO TABS
650.0000 mg | ORAL_TABLET | Freq: Once | ORAL | Status: AC
  Administered 2022-04-10: 650 mg via ORAL
  Filled 2022-04-10: qty 2

## 2022-04-10 MED ORDER — FUROSEMIDE 10 MG/ML IJ SOLN
40.0000 mg | Freq: Once | INTRAMUSCULAR | Status: AC
  Administered 2022-04-10: 40 mg via INTRAVENOUS
  Filled 2022-04-10: qty 4

## 2022-04-10 NOTE — ED Notes (Signed)
Pt ambulated around the room w/o any issues, O2 remained above 94%.  Pt stated that he felt so much better and would not have been able to do this when he first got here.  Provider aware.

## 2022-04-10 NOTE — Progress Notes (Signed)
ADVANCED HF CLINIC NOTE  Referring Physician: Agustin Cree Primary Care: Cyndi Bender Primary Cardiologist: Agustin Cree   HPI:  Joe [name] is a 51 y/o male with h/o morbid obesity, DM2, HTN, HL, OSA, CAD and systolic HF (EF 16-07%) referred by Dr. Agustin Cree for further management of his HF   Has h/o RBBB but never had any other heart problems until 3/19. Saw Dr. Drue Dun in 3/19 due to CP and SOB. Cath EF 25-35% Severe diabetic CAD with LAD disease (m90 and m75),  RCA d50, LCA m50.   Echo 7/20 EF 20-25% moderate RV dysfunction  Cath January 2020 which showed 40% distal LAD, LAD/LCx stents were patent, mid RCA got 25% lesion right PDA had 50% stenosis the circumflex artery were open.  No intervention has been required.    Had cath 1/22 with stable CAD  Myoview 12/19 EF 30% Apical and infero-apical scar  Had St. Jude CRT-D placed 03/19/19 with Dr. Curt Bears.  Admitted 9/22 for CP. HsTn was 4. Underwent cardiac cath on 03/07/2021 which showed patent stents in the LAD and LCX with diffuse distal LAD which may be a trigger for angina as well as mildly elevated RA pressure with normal PCWP and LVEDP and preserved cardiac output.   Echo 9/22 EF 35-40%  Admitted 6/23 with CP. Hstrop 35. Felt to be non-cardiac. Echo EF 30-35%  Here with his wife. Says he has been doing ok recently. Can do ADLs without too much problem. Just takes his time. But does get SOB easily. Over past 3 days more SOB. Was in the ER overnight due to recurrent SOB. Found to be volume overloaded. BNP only 172. Hst trop 37->47. CXR with pulmonary edema. Given IV lasix with > 2L out. Refused hospital admission. Having orthopnea or PND. Not sure if he swells. Has been in [place] 2-3x to get fluids back. Rare CP.   ReDs 43%    Past Medical History:  Diagnosis Date   Abnormal EKG    Acute systolic heart failure ([place]) 09/06/2017   Angina pectoris ([place]) 09/05/2017   Body mass index 45.0-49.9, adult (HCC)    CAD S/P  percutaneous coronary angioplasty 09/07/2017   mLAD and dLAD PCI with DES 09/06/17   CHF (congestive heart failure) (HCC)    Chronic systolic (congestive) heart failure ([place]) 3/71/0626   Complication of anesthesia 1995   "had hard time waking up; breathing w/vasectomy"   Coronary artery disease    Erectile dysfunction    Essential hypertension, benign    Fatigue    GERD (gastroesophageal reflux disease)    Gout    High cholesterol    "just started tx yesterday" (09/06/2017)   History of gout    "RX prn" (09/06/2017)   Ischemic cardiomyopathy 09/07/2017   EF 25-35%   Muscular chest pain    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   Below Left Nare (MOH's)   Obesity    Obstructive sleep apnea    OSA on CPAP    Plantar fasciitis    Pre-operative clearance 02/11/2018   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/02/2019   [employer] Jude   Right bundle branch block    Shortness of breath 09/05/2017   Testosterone deficiency    Type 2 diabetes mellitus with complication, without long-term current use of insulin ([place]) 09/05/2017   Type II diabetes mellitus ([place])    "started tx 08/28/2017"    Current Outpatient Medications  Medication Sig Dispense Refill   acetaminophen (TYLENOL) 500 MG tablet  Take 1,000 mg by mouth every 6 (six) hours as needed for moderate pain or headache.     allopurinol (ZYLOPRIM) 100 MG tablet Take 100 mg by mouth daily as needed (for gout).      aspirin EC 81 MG tablet Take 1 tablet (81 mg total) by mouth daily. 90 tablet 3   carvedilol (COREG) 12.5 MG tablet Take 1 tablet (12.5 mg total) by mouth 2 (two) times daily. 180 tablet 2   colchicine 0.6 MG tablet Take 0.6 mg by mouth daily as needed (Gout).     dapagliflozin propanediol (FARXIGA) 10 MG TABS tablet Take 1 tablet (10 mg total) by mouth daily. 90 tablet 2   escitalopram (LEXAPRO) 10 MG tablet Take 10 mg by mouth every morning.     ezetimibe (ZETIA) 10 MG tablet TAKE 1 TABLET BY  MOUTH EVERY DAY 90 tablet 1   furosemide (LASIX) 40 MG tablet Take 1 tablet (40 mg total) by mouth daily. 90 tablet 1   gabapentin (NEURONTIN) 100 MG capsule Take 100 mg by mouth 2 (two) times daily.     ibuprofen (ADVIL) 200 MG tablet Take 800 mg by mouth every 8 (eight) hours as needed for headache or moderate pain.     indomethacin (INDOCIN) 50 MG capsule Take 50 mg by mouth daily as needed for mild pain or moderate pain (Gout).     isosorbide mononitrate (IMDUR) 30 MG 24 hr tablet Take 1 tablet (30 mg total) by mouth daily. 90 tablet 3   metFORMIN (GLUCOPHAGE-XR) 500 MG 24 hr tablet Take 2 tablets (1,000 mg total) by mouth in the morning and at bedtime.     mexiletine (MEXITIL) 200 MG capsule Take 1 capsule (200 mg total) by mouth 2 (two) times daily. Please make overdue appt with Dr. Curt Bears before anymore refills. Thank you 3rd and Final Attempt 180 capsule 3   nitroGLYCERIN (NITROSTAT) 0.4 MG SL tablet Place 0.4 mg under the tongue every 5 (five) minutes as needed for chest pain.     omeprazole (PRILOSEC) 40 MG capsule Take 1 capsule (40 mg total) by mouth daily. 90 capsule 1   Polyethylene Glycol 400 (BLINK TEARS OP) Place 2 drops into both eyes 4 (four) times daily as needed (for dry eyes).     potassium chloride SA (KLOR-CON M) 20 MEQ tablet Take 1 tablet (20 mEq total) by mouth daily. 90 tablet 3   ranolazine (RANEXA) 500 MG 12 hr tablet Take 500 mg by mouth 2 (two) times daily.     rosuvastatin (CRESTOR) 40 MG tablet Take 1 tablet (40 mg total) by mouth every evening. 90 tablet 3   sacubitril-valsartan (ENTRESTO) 24-26 MG Take 1 tablet by mouth 2 (two) times daily. 60 tablet 2   Semaglutide,0.25 or 0.'5MG'$ /DOS, (OZEMPIC, 0.25 OR 0.5 MG/DOSE,) 2 MG/1.5ML SOPN Inject 0.25 mg into the skin once a week.     No current facility-administered medications for this encounter.    No Known Allergies    Social History   Socioeconomic History   Marital status: Married    Spouse name: Not on  file   Number of children: Not on file   Years of education: Not on file   Highest education level: Not on file  Occupational History   Not on file  Tobacco Use   Smoking status: Never   Smokeless tobacco: Never  Vaping Use   Vaping Use: Never used  Substance and Sexual Activity   Alcohol use: No   Drug  use: Never   Sexual activity: Not on file  Other Topics Concern   Not on file  Social History Narrative   Not on file   Social Determinants of Health   Financial Resource Strain: Not on file  Food Insecurity: Not on file  Transportation Needs: Not on file  Physical Activity: Not on file  Stress: Not on file  Social Connections: Not on file  Intimate Partner Violence: Not on file      Family History  Problem Relation Age of Onset   Lymphoma Father    Diabetes Mellitus II Brother    Diabetes Mellitus II Paternal Grandfather    Lung cancer Mother    Brain cancer Mother     Vitals:   04/10/22 1427  BP: 110/70  Pulse: 99  SpO2: 95%  Weight: (!) 160.1 kg (353 lb)   Wt Readings from Last 3 Encounters:  04/10/22 (!) 160.1 kg (353 lb)  04/10/22 (!) 155.6 kg (343 lb)  02/07/22 (!) 160.3 kg (353 lb 6.4 oz)      PHYSICAL EXAM: General:  Obese male No resp difficulty HEENT: normal Neck: supple. Hard to see jvp Carotids 2+ bilat; no bruits. No lymphadenopathy or thryomegaly appreciated. Cor: PMI nondisplaced. Regular rate & rhythm. No rubs, gallops or murmurs. Lungs: clear Abdomen: obese soft, nontender, nondistended. No hepatosplenomegaly. No bruits or masses. Good bowel sounds. Extremities: no cyanosis, clubbing, rash, 1-2+ edema Neuro: alert & orientedx3, cranial nerves grossly intact. moves all 4 extremities w/o difficulty. Affect pleasant    ASSESSMENT & PLAN:  1. Chronic systolic HF - due to iCM - 8/20 Echo EF 20-25%  - Echo 9/22 35-40% - Echo 6/23 EF 30-35% - s/p STJ CRT-D Device interrogated in clinic. Fluid ok. No VT/AF  100% pacing - Stable NYHA  III - Volume status up on lasix 40 daily. Previously decreased due to overdiuresis on lasix 80 daily.  - ReDS 43% - Will give Furoscix x 3 days. Then increase lasix to 80 daily alternating with 40 daily.  - Continue Entresto 24/26 bid (recently decreased form 97/103 bid) - Continue spiro 12.5  - Continue carvedilol 6.25 bid (recently decreased from 9.375 bid) - Continue Farxiga 10.  - labs from ER reviewed personally.  - Will need f/u in 2-3 weeks   2. CAD with chronic stable angina - s/p multiple stents to LAD and LCX - cath 9/22 stable CAD - CP under good control  - Followed Dr. Raliegh Ip. - Continue Imdur and Ranexa.  3. DM2 - Continue Farxiga  4. OSA - not using CPAP -says machine was recalled a while ago - will schedule f/u with Dr Radford Pax  5. Severe obesity - has struggled with weight loss - continue Ozempic  6. 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 Followed by Dr. Curt Bears  Total time spent 40 minutes. Over half that time spent discussing above.    Glori Bickers, MD  2:57 PM

## 2022-04-10 NOTE — Progress Notes (Signed)
Provided patient education on Furoscix using demo kits and Furoscix video, QR code provided on AVS for further viewing. Furoscix order form completed and signed by Dr Haroldine Laws. Order form, ins info, & OV note all faxed into [employer].

## 2022-04-10 NOTE — Progress Notes (Signed)
ReDS Vest / Clip - 04/10/22 1500       ReDS Vest / Clip   Station Marker D    Ruler Value 45    ReDS Value Range High volume overload    ReDS Actual Value 43

## 2022-04-10 NOTE — ED Notes (Signed)
Provider bedside.

## 2022-04-10 NOTE — ED Notes (Signed)
Patient transported to X-ray 

## 2022-04-10 NOTE — Patient Instructions (Signed)
Medication Changes:  Your provider has order Furoscix for you. This is an on-body infuser that gives you a dose of Furosemide.   It will be shipped to your home (we have provided you 2 sample kits)  [employer] [facility] will call you to discuss before shipping so, PLEASE answer unknown calls  For questions regarding the device call [employer] at [phone]  Ensure you write down the time you start your infusion so that if there is a problem you will know how long the infusion lasted  Use Furoscix only AS DIRECTED by our office  Dosing Directions:   Day 1= Use 1 kit today (Mon 10/23)  Day 2= Use 1 kit tomorrow (Tue 10/24)  Day 3= Use 1 kit Wed 10/25   STARTING THURSDAY 10/26 Take Furosemide 40 mg Daily every other day ALTERNATING with Furosemide 80 mg Daily every other day    Lab Work:  none  Testing/Procedures:  none  Referrals:  none  Special Instructions // Education:  Do the following things EVERYDAY: Weigh yourself in the morning before breakfast. Write it down and keep it in a log. Take your medicines as prescribed Eat low salt foods--Limit salt (sodium) to 2000 mg per day.  Stay as active as you can everyday Limit all fluids for the day to less than 2 liters   Follow-Up in: 4 weeks  At the [facility], you and your health needs are our priority. We have a designated team specialized in the treatment of Heart Failure. This Care Team includes your primary Heart Failure Specialized Cardiologist (physician), Advanced Practice Providers (APPs- Physician Assistants and Nurse Practitioners), and Pharmacist who all work together to provide you with the care you need, when you need it.   You may see any of the following providers on your designated Care Team at your next follow up:  Dr. Glori Bickers Dr. Loralie Champagne Dr. Roxana Hires, NP Lyda Jester, [facility] [place], [place] Forestine Na, NP Audry Riles, PharmD   Please be sure to bring in all your medications bottles to every appointment.   Need to Contact us:  If you have any questions or concerns before your next appointment please send [place] a message through [place] or call our office at [phone].    TO LEAVE A MESSAGE FOR THE NURSE SELECT OPTION 2, PLEASE LEAVE A MESSAGE INCLUDING: YOUR NAME DATE OF BIRTH CALL BACK NUMBER REASON FOR CALL**this is important as we prioritize the call backs  YOU WILL RECEIVE A CALL BACK THE SAME DAY AS LONG AS YOU CALL BEFORE 4:00 PM

## 2022-04-10 NOTE — Progress Notes (Signed)
Medication Samples have been provided to the patient.  Drug name: Furoscix       Strength: 80 mg        Qty: 2  LOT: [id number]  Exp.Date: 03/19/2023  Dosing instructions: As directed   The patient has been instructed regarding the correct time, dose, and frequency of taking this medication, including desired effects and most common side effects.   Juanita Laster Iridiana Fonner 3:30 PM 04/10/2022

## 2022-04-10 NOTE — ED Triage Notes (Signed)
Pt states he has had shortness of breath since Friday, worse with exertion. Nausea. "Mild" central chest discomfort that comes and goes today. Headache. Reports his urine is dark.

## 2022-04-10 NOTE — ED Provider Notes (Signed)
[facility] DEPART[facility] Provider Note   CSN: [id number] Arrival date & time: 04/10/22  0002     History  Chief Complaint  Patient presents with   Shortness of Breath    Joe [name] is a 51 y.o. male.  The history is provided by the patient and the spouse.  Shortness of Breath Severity:  Moderate Onset quality:  Gradual Duration:  2 days Progression:  Worsening Chronicity:  New Relieved by:  Rest Worsened by:  Activity Associated symptoms: chest pain   Associated symptoms: no fever   Patient with history of CHF, CAD, obesity presents with shortness of breath.  Patient reports the past 2 days has had increasing shortness of breath, mostly with exertion.  He also reports orthopnea.  He has not had any significant weight gain.  He reports lower extremity edema.  No fevers or vomiting.  No syncope.  No ICD shocks.  He reports mild chest tightness. He suspects he may be dehydrated as his urine is dark. He also reports mild headache and neck pain.  No recent trauma. He takes Lasix 40 mg every morning    Past Medical History:  Diagnosis Date   Abnormal EKG    Acute systolic heart failure ([place]) 09/06/2017   Angina pectoris ([place]) 09/05/2017   Body mass index 45.0-49.9, adult (HCC)    CAD S/P percutaneous coronary angioplasty 09/07/2017   mLAD and dLAD PCI with DES 09/06/17   CHF (congestive heart failure) (HCC)    Chronic systolic (congestive) heart failure ([place]) 1/54/0086   Complication of anesthesia 1995   "had hard time waking up; breathing w/vasectomy"   Coronary artery disease    Erectile dysfunction    Essential hypertension, benign    Fatigue    GERD (gastroesophageal reflux disease)    Gout    High cholesterol    "just started tx yesterday" (09/06/2017)   History of gout    "RX prn" (09/06/2017)   Ischemic cardiomyopathy 09/07/2017   EF 25-35%   Muscular chest pain    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   Below Left Nare  (MOH's)   Obesity    Obstructive sleep apnea    OSA on CPAP    Plantar fasciitis    Pre-operative clearance 02/11/2018   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/02/2019   [employer]   Right bundle branch block    Shortness of breath 09/05/2017   Testosterone deficiency    Type 2 diabetes mellitus with complication, without long-term current use of insulin ([place]) 09/05/2017   Type II diabetes mellitus ([place])    "started tx 08/28/2017"    Home Medications Prior to Admission medications   Medication Sig Start Date End Date Taking? Authorizing Provider  acetaminophen (TYLENOL) 500 MG tablet Take 1,000 mg by mouth every 6 (six) hours as needed for moderate pain or headache.    [provider]  allopurinol (ZYLOPRIM) 100 MG tablet Take 100 mg by mouth daily as needed (for gout).     [provider]  aspirin EC 81 MG tablet Take 1 tablet (81 mg total) by mouth daily. Patient taking differently: Take 81 mg by mouth every morning. 09/05/17   Park Liter, MD  carvedilol (COREG) 12.5 MG tablet Take 1 tablet (12.5 mg total) by mouth 2 (two) times daily. 11/02/21   Camnitz, Ocie Doyne, MD  colchicine 0.6 MG tablet Take 0.6 mg by mouth daily as needed (Gout). 05/10/20   [provider]  dapagliflozin propanediol (FARXIGA) 10 MG TABS tablet Take 1 tablet (10 mg total) by mouth daily. 03/21/21   Park Liter, MD  escitalopram (LEXAPRO) 10 MG tablet Take 10 mg by mouth every morning. 06/24/21   [provider]  ezetimibe (ZETIA) 10 MG tablet TAKE 1 TABLET BY MOUTH EVERY DAY Patient taking differently: Take 10 mg by mouth daily. 01/31/21   Park Liter, MD  furosemide (LASIX) 40 MG tablet Take 1 tablet (40 mg total) by mouth daily. 03/08/21   Darliss Cheney, MD  gabapentin (NEURONTIN) 100 MG capsule Take 100 mg by mouth 2 (two) times daily. 11/09/21   [provider]  ibuprofen (ADVIL) 200 MG tablet Take 800 mg by mouth  every 8 (eight) hours as needed for headache or moderate pain.    [provider]  indomethacin (INDOCIN) 50 MG capsule Take 50 mg by mouth daily as needed for mild pain or moderate pain (Gout). 03/11/19   [provider]  isosorbide mononitrate (IMDUR) 30 MG 24 hr tablet Take 1 tablet (30 mg total) by mouth daily. 10/05/21   Park Liter, MD  metFORMIN (GLUCOPHAGE-XR) 500 MG 24 hr tablet Take 2 tablets (1,000 mg total) by mouth in the morning and at bedtime. 07/01/20   Martinique, Peter M, MD  mexiletine (MEXITIL) 200 MG capsule Take 1 capsule (200 mg total) by mouth 2 (two) times daily. Please make overdue appt with Dr. Curt Bears before anymore refills. Thank you 3rd and Final Attempt 10/05/21   Park Liter, MD  nitroGLYCERIN (NITROSTAT) 0.4 MG SL tablet Place 0.4 mg under the tongue every 5 (five) minutes as needed for chest pain.    [provider]  omeprazole (PRILOSEC) 40 MG capsule Take 1 capsule (40 mg total) by mouth daily. Patient taking differently: Take 40 mg by mouth every morning. 01/14/19   Park Liter, MD  Polyethylene Glycol 400 (BLINK TEARS OP) Place 2 drops into both eyes 4 (four) times daily as needed (for dry eyes).    [provider]  potassium chloride SA (KLOR-CON M) 20 MEQ tablet Take 1 tablet (20 mEq total) by mouth daily. 10/05/21   Park Liter, MD  ranolazine (RANEXA) 500 MG 12 hr tablet Take 500 mg by mouth 2 (two) times daily.    [provider]  rosuvastatin (CRESTOR) 40 MG tablet Take 1 tablet (40 mg total) by mouth every evening. 07/15/21   Park Liter, MD  sacubitril-valsartan (ENTRESTO) 24-26 MG Take 1 tablet by mouth 2 (two) times daily. 02/09/22   Park Liter, MD      Allergies    Patient has no known allergies.    Review of Systems   Review of Systems  Constitutional:  Negative for fever.  Respiratory:  Positive for shortness of breath.   Cardiovascular:  Positive for chest pain  and leg swelling.    Physical Exam Updated Vital Signs BP (!) 123/91   Pulse (!) 106   Temp 98.1 F (36.7 C) (Oral)   Resp 13   Ht 1.905 m ('6\' 3"'$ )   Wt (!) 155.6 kg   SpO2 95%   BMI 42.87 kg/m  Physical Exam CONSTITUTIONAL: Chronically ill-appearing HEAD: Normocephalic/atraumatic EYES: EOMI/PERRL ENMT: Mucous membranes moist NECK: supple no meningeal signs SPINE/BACK:entire spine nontender CV: S1/S2 noted, no murmurs/rubs/gallops noted LUNGS: Bibasilar crackles are noted ABDOMEN: soft, obese NEURO: Pt is awake/alert/appropriate, moves all extremitiesx4.  No facial droop.   EXTREMITIES: pulses normal/equal, full ROM, pitting  edema in the bilateral lower extremities SKIN: warm, color normal PSYCH: no abnormalities of mood noted, alert and oriented to situation  ED Results / Procedures / Treatments   Labs (all labs ordered are listed, but only abnormal results are displayed) Labs Reviewed  BASIC METABOLIC PANEL - Abnormal; Notable for the following components:      Result Value   Glucose, Bld 266 (*)    Calcium 8.7 (*)    All other components within normal limits  BRAIN NATRIURETIC PEPTIDE - Abnormal; Notable for the following components:   B Natriuretic Peptide 172.3 (*)    All other components within normal limits  URINALYSIS, ROUTINE W REFLEX MICROSCOPIC - Abnormal; Notable for the following components:   Glucose, UA >=500 (*)    All other components within normal limits  TROPONIN I (HIGH SENSITIVITY) - Abnormal; Notable for the following components:   Troponin I (High Sensitivity) 37 (*)    All other components within normal limits  TROPONIN I (HIGH SENSITIVITY) - Abnormal; Notable for the following components:   Troponin I (High Sensitivity) 47 (*)    All other components within normal limits  CBC  URINALYSIS, MICROSCOPIC (REFLEX)    EKG EKG Interpretation  Date/Time:  Monday April 10 2022 00:04:35 EDT Ventricular Rate:  119 PR Interval:    QRS  Duration: 160 QT Interval:  400 QTC Calculation: 562 R Axis:   -80 Text Interpretation: Ventricular-paced rhythm Biventricular pacemaker detected Abnormal ECG Confirmed by Ripley Fraise [phone]) on 04/10/2022 1:20:42 AM  Radiology DG Chest 2 View  Result Date: 04/10/2022 CLINICAL DATA:  Shortness of breath and congestive heart failure. Chest pain since Friday. EXAM: CHEST - 2 VIEW COMPARISON:  11/27/2021 FINDINGS: Cardiac pacemaker. Cardiac enlargement with pulmonary vascular congestion and perihilar and basilar interstitial changes likely representing edema. Changes are progressing since previous study. No focal consolidation. No pleural effusions although there is evidence of fluid in the minor fissure. No pneumothorax. Mediastinal contours appear intact. IMPRESSION: Cardiac enlargement with pulmonary vascular congestion and interstitial edema, progressing since previous study. Electronically Signed   By: Lucienne Capers M.D.   On: 04/10/2022 00:51    Procedures Procedures    Medications Ordered in ED Medications  furosemide (LASIX) injection 40 mg (40 mg Intravenous Given 04/10/22 0207)  acetaminophen (TYLENOL) tablet 650 mg (650 mg Oral Given 04/10/22 0216)    ED Course/ Medical Decision Making/ A&P Clinical Course as of 04/10/22 0456  Mon Apr 10, 2022  0131 Glucose(!): 266 Hyperglycemia [DW]  0329 Patient has had over 1.5 L of urine output.  He reports feeling mildly improved.  We will continue to monitor [DW]  [id number] Patient has had over 2 L of urine output.  He reports feeling improved.  Pulse ox above 97%.  He walked in the room and feels much improved.  He is requesting discharge.  He has follow-up later today at 2 PM with the advanced heart failure clinic.  At this point I feel he is safe for discharge since he has such a close follow-up.  Troponin elevated, but this is been seen previously.  Patient is mildly tachycardic though this is also improving and this has been seen  previously [DW]    Clinical Course User Index [DW] Ripley Fraise, MD                           Medical Decision Making Amount and/or Complexity of Data Reviewed Labs: ordered. Decision-making details documented  in ED Course. Radiology: ordered.  Risk OTC drugs. Prescription drug management.   This patient presents to the ED for concern of shortness of breath, this involves an extensive number of treatment options, and is a complaint that carries with it a high risk of complications and morbidity.  The differential diagnosis includes but is not limited to Acute coronary syndrome, pneumonia, acute pulmonary edema, pneumothorax, acute anemia, pulmonary embolism    Comorbidities that complicate the patient evaluation: Patient's presentation is complicated by their history of obesity, CAD, CHF  Social Determinants of Health: Patient's  obesity   increases the complexity of managing their presentation  Additional history obtained: Additional history obtained from spouse Records reviewed previous admission documents  Lab Tests: I Ordered, and personally interpreted labs.  The pertinent results include:  hyperglycemia  Imaging Studies ordered: I ordered imaging studies including X-ray chest   I independently visualized and interpreted imaging which showed pulmonary edema I agree with the radiologist interpretation  Cardiac Monitoring: The patient was maintained on a cardiac monitor.  I personally viewed and interpreted the cardiac monitor which showed an underlying rhythm of:  sinus tachycardia  Medicines ordered and prescription drug management: I ordered medication including lasix  for CHF  Reevaluation of the patient after these medicines showed that the patient    improved   Critical Interventions:  IV Lasix  After the interventions noted above, I reevaluated the patient and found that they have :improved  Complexity of problems addressed: Patient's presentation is  most consistent with  acute presentation with potential threat to life or bodily function  Disposition: After consideration of the diagnostic results and the patient's response to treatment,  I feel that the patent would benefit from discharge   .           Final Clinical Impression(s) / ED Diagnoses Final diagnoses:  Acute on chronic systolic congestive heart failure [facility])    Rx / DC Orders ED Discharge Orders     None         Ripley Fraise, MD 04/10/22 [phone]

## 2022-04-11 ENCOUNTER — Other Ambulatory Visit (HOSPITAL_COMMUNITY): Payer: Self-pay | Admitting: *Deleted

## 2022-04-11 ENCOUNTER — Encounter (HOSPITAL_COMMUNITY): Payer: Self-pay

## 2022-04-11 DIAGNOSIS — Z006 Encounter for examination for normal comparison and control in clinical research program: Secondary | ICD-10-CM

## 2022-04-11 NOTE — Research (Addendum)
Spoke to pt about Detect HF study. Pt expressed interest in participation. Informed consent given to take home and read. Phone number given and told to call with any questions. Will f/u with patient.

## 2022-04-11 NOTE — Progress Notes (Signed)
Amb ref to anti

## 2022-04-13 ENCOUNTER — Telehealth: Payer: Self-pay

## 2022-04-13 ENCOUNTER — Encounter: Payer: Self-pay | Admitting: Cardiology

## 2022-04-13 NOTE — Telephone Encounter (Signed)
[facility]. There were two atrial arrhythmias and two short NSVT arrhythmias that all occurred at the same time and appear to be noise or cautery interference, sent to triage.  Next remote 04/17/2022.  Patient unable to recall any external factors that may have caused noise. Advised patient we will continue to monitor and if more is noted we will f/u. Patient voiced understanding and agreeable to plan.  Forwarding to Dr. Curt Bears as Juluis Rainier.

## 2022-04-13 NOTE — Telephone Encounter (Signed)
Patient called back and reports having skin cancer removed which did require cautery to stop bleeding on 03/22/22. States location was head and left forearm. States they did [place] "grounding pad". Advised patient I will add to his chart and nothing to do at this point. Patient appreciative of call.

## 2022-04-17 ENCOUNTER — Ambulatory Visit (INDEPENDENT_AMBULATORY_CARE_PROVIDER_SITE_OTHER): Payer: 59

## 2022-04-17 DIAGNOSIS — I255 Ischemic cardiomyopathy: Secondary | ICD-10-CM

## 2022-04-21 LAB — CUP PACEART REMOTE DEVICE CHECK
Battery Remaining Longevity: 46 mo
Battery Remaining Percentage: 56 %
Battery Voltage: 2.95 V
Brady Statistic AP VP Percent: 2.9 %
Brady Statistic AP VS Percent: 1 %
Brady Statistic AS VP Percent: 90 %
Brady Statistic AS VS Percent: 4.5 %
Brady Statistic RA Percent Paced: 1.3 %
Date Time Interrogation Session: 20231102181628
HighPow Impedance: 87 Ohm
HighPow Impedance: 87 Ohm
Implantable Lead Connection Status: 753985
Implantable Lead Connection Status: 753985
Implantable Lead Connection Status: 753985
Implantable Lead Implant Date: 20200930
Implantable Lead Implant Date: 20200930
Implantable Lead Implant Date: 20200930
Implantable Lead Location: 753858
Implantable Lead Location: 753859
Implantable Lead Location: 753860
Implantable Pulse Generator Implant Date: 20200930
Lead Channel Impedance Value: 1050 Ohm
Lead Channel Impedance Value: 390 Ohm
Lead Channel Impedance Value: 490 Ohm
Lead Channel Pacing Threshold Amplitude: 0.5 V
Lead Channel Pacing Threshold Amplitude: 0.75 V
Lead Channel Pacing Threshold Amplitude: 1 V
Lead Channel Pacing Threshold Pulse Width: 0.5 ms
Lead Channel Pacing Threshold Pulse Width: 0.5 ms
Lead Channel Pacing Threshold Pulse Width: 0.5 ms
Lead Channel Sensing Intrinsic Amplitude: 11.7 mV
Lead Channel Sensing Intrinsic Amplitude: 2.6 mV
Lead Channel Setting Pacing Amplitude: 2 V
Lead Channel Setting Pacing Amplitude: 2.5 V
Lead Channel Setting Pacing Amplitude: 2.5 V
Lead Channel Setting Pacing Pulse Width: 0.5 ms
Lead Channel Setting Pacing Pulse Width: 0.5 ms
Lead Channel Setting Sensing Sensitivity: 0.5 mV
Pulse Gen Serial Number: 9901124
Zone Setting Status: 755011

## 2022-05-01 ENCOUNTER — Other Ambulatory Visit: Payer: Self-pay | Admitting: Cardiology

## 2022-05-04 ENCOUNTER — Encounter: Payer: Self-pay | Admitting: Internal Medicine

## 2022-05-08 ENCOUNTER — Telehealth (HOSPITAL_COMMUNITY): Payer: Self-pay | Admitting: Cardiology

## 2022-05-08 NOTE — Telephone Encounter (Signed)
FUROSCIX ordered by Dr Haroldine Laws PA approved  HUB ID [phone] Per [facility]   Confirmed with patient infusers received x 4

## 2022-05-10 ENCOUNTER — Ambulatory Visit: Payer: 59 | Attending: Cardiology | Admitting: Cardiology

## 2022-05-10 VITALS — BP 142/94 | HR 105 | Ht 75.0 in | Wt 354.0 lb

## 2022-05-10 DIAGNOSIS — Z9861 Coronary angioplasty status: Secondary | ICD-10-CM

## 2022-05-10 DIAGNOSIS — R0609 Other forms of dyspnea: Secondary | ICD-10-CM

## 2022-05-10 DIAGNOSIS — I2510 Atherosclerotic heart disease of native coronary artery without angina pectoris: Secondary | ICD-10-CM

## 2022-05-10 DIAGNOSIS — I4510 Unspecified right bundle-branch block: Secondary | ICD-10-CM | POA: Diagnosis not present

## 2022-05-10 DIAGNOSIS — I255 Ischemic cardiomyopathy: Secondary | ICD-10-CM | POA: Diagnosis not present

## 2022-05-10 NOTE — Patient Instructions (Signed)
Medication Instructions:  ?Your physician recommends that you continue on your current medications as directed. Please refer to the Current Medication list given to you today.  ?*If you need a refill on your cardiac medications before your next appointment, please call your pharmacy* ? ? ?Lab Work: ?None Ordered ?If you have labs (blood work) drawn today and your tests are completely normal, you will receive your results only by: ?MyChart Message (if you have MyChart) OR ?A paper copy in the mail ?If you have any lab test that is abnormal or we need to change your treatment, we will call you to review the results. ? ? ?Testing/Procedures: ?None Ordered ? ? ?Follow-Up: ?At [facility], you and your health needs are our priority.  As part of our continuing mission to provide you with exceptional heart care, we have created designated Provider Care Teams.  These Care Teams include your primary Cardiologist (physician) and Advanced Practice Providers (APPs -  Physician Assistants and Nurse Practitioners) who all work together to provide you with the care you need, when you need it. ? ?We recommend signing up for the patient portal called "MyChart".  Sign up information is provided on this After Visit Summary.  MyChart is used to connect with patients for Virtual Visits (Telemedicine).  Patients are able to view lab/test results, encounter notes, upcoming appointments, etc.  Non-urgent messages can be sent to your provider as well.   ?To learn more about what you can do with MyChart, go to [email or web address].   ? ?Your next appointment:   ?3 month(s) ? ?The format for your next appointment:   ?In Person ? ?Provider:   ?Robert Krasowski, MD  ? ? ?Other Instructions ?NA  ?

## 2022-05-10 NOTE — Progress Notes (Signed)
Cardiology Office Note:    Date:  05/10/2022   ID:  Joe [name], DOB 12/18/1970, MRN [id number]  PCP:  Cyndi Bender, PA-C  Cardiologist:  Jenne Campus, MD    Referring MD: Cyndi Bender, PA-C   No chief complaint on file.   History of Present Illness:    Joe [name] is a 51 y.o. male  with past medical history significant for coronary artery disease in March 2019 he required stenting to mid to distal LAD after that he was doing quite nicely but then started getting symptoms again did have a cardiac catheterization in January 2021 which showed 40% stenosis of distal LAD stents were patent, mid RCA had about 25% lesion PDA also got to 50% stenosis.  Circumflex was normal.  At the same time he was noted to have diminished left ventricle ejection fraction with echocardiogram showing ejection fraction 20 to 25%.  Eventually on March 19, 2019 he did receive CRT-D.  After that echocardiogram performed showed ejection fraction 35 to 40%  In June 20, 2020 he had cardiac catheterization done for atypical chest pain however none did not have any target lesion for intervention.  He is being seen by our advanced congestive heart failure team and I appreciate their expertise. Majority of time he is doing well.  He gets used to the fact that he will have some episode of shortness of breath.  End up being in the emergency room a few weeks ago and was given extra Lasix did quite well.  Now he ran out of [place] about a month ago.  Apparently shortness company did not approve this medication which is surprising to me I will give him samples today and we will try to find out what the problem is.  Denies have any chest pain tightness squeezing pressure burning chest no paroxysmal nocturnal dyspnea but does have some exertional shortness of breath minimal swelling of lower extremities  Past Medical History:  Diagnosis Date   Abnormal EKG    Acute systolic heart failure ([place]) 09/06/2017   Angina  pectoris ([place]) 09/05/2017   Body mass index 45.0-49.9, adult (HCC)    CAD S/P percutaneous coronary angioplasty 09/07/2017   mLAD and dLAD PCI with DES 09/06/17   CHF (congestive heart failure) (HCC)    Chronic systolic (congestive) heart failure ([place]) 5/42/7062   Complication of anesthesia 1995   "had hard time waking up; breathing w/vasectomy"   Coronary artery disease    Erectile dysfunction    Essential hypertension, benign    Fatigue    GERD (gastroesophageal reflux disease)    Gout    High cholesterol    "just started tx yesterday" (09/06/2017)   History of gout    "RX prn" (09/06/2017)   Ischemic cardiomyopathy 09/07/2017   EF 25-35%   Muscular chest pain    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   Below Left Nare (MOH's)   Obesity    Obstructive sleep apnea    OSA on CPAP    Plantar fasciitis    Pre-operative clearance 02/11/2018   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/02/2019   Saint Jude   Right bundle branch block    Shortness of breath 09/05/2017   Testosterone deficiency    Type 2 diabetes mellitus with complication, without long-term current use of insulin ([place]) 09/05/2017   Type II diabetes mellitus ([place])    "started tx 08/28/2017"    Past Surgical History:  Procedure Laterality Date   BACK SURGERY  BIV ICD INSERTION CRT-D N/A 03/19/2019   Procedure: BIV ICD INSERTION CRT-D;  Surgeon: Constance Haw, MD;  Location: [place] [facility];  Service: Cardiovascular;  Laterality: N/A;   CORONARY ANGIOPLASTY WITH STENT PLACEMENT  09/06/2017   "3 stents"   CORONARY STENT INTERVENTION N/A 09/06/2017   Procedure: CORONARY STENT INTERVENTION;  Surgeon: Jettie Booze, MD;  Location: [place] [facility];  Service: Cardiovascular;  Laterality: N/A;   CORONARY STENT INTERVENTION N/A 02/15/2018   Procedure: CORONARY STENT INTERVENTION;  Surgeon: Jettie Booze, MD;  Location: [place] [facility];  Service: Cardiovascular;   Laterality: N/A;   KNEE ARTHROSCOPY Right    LEFT HEART CATH AND CORONARY ANGIOGRAPHY N/A 09/06/2017   Procedure: LEFT HEART CATH AND CORONARY ANGIOGRAPHY;  Surgeon: Jettie Booze, MD;  Location: [place] [facility];  Service: Cardiovascular;  Laterality: N/A;   LEFT HEART CATH AND CORONARY ANGIOGRAPHY N/A 02/15/2018   Procedure: LEFT HEART CATH AND CORONARY ANGIOGRAPHY;  Surgeon: Jettie Booze, MD;  Location: [place] [facility];  Service: Cardiovascular;  Laterality: N/A;   LEFT HEART CATH AND CORONARY ANGIOGRAPHY N/A 06/27/2018   Procedure: LEFT HEART CATH AND CORONARY ANGIOGRAPHY;  Surgeon: Jettie Booze, MD;  Location: [place] [facility];  Service: Cardiovascular;  Laterality: N/A;   LEFT HEART CATH AND CORONARY ANGIOGRAPHY N/A 06/28/2020   Procedure: LEFT HEART CATH AND CORONARY ANGIOGRAPHY;  Surgeon: Martinique, Peter M, MD;  Location: [facility];  Service: Cardiovascular;  Laterality: N/A;   LUMBAR SPINE SURGERY  ~ 2001   Intradiscal Electrothermoplasty   RIGHT/LEFT HEART CATH AND CORONARY ANGIOGRAPHY N/A 03/07/2021   Procedure: RIGHT/LEFT HEART CATH AND CORONARY ANGIOGRAPHY;  Surgeon: Larey Dresser, MD;  Location: [place] [facility];  Service: Cardiovascular;  Laterality: N/A;   VASECTOMY  1995    Current Medications: Current Meds  Medication Sig   acetaminophen (TYLENOL) 500 MG tablet Take 1,000 mg by mouth every 6 (six) hours as needed for moderate pain or headache.   allopurinol (ZYLOPRIM) 100 MG tablet Take 100 mg by mouth daily as needed (for gout).    aspirin EC 81 MG tablet Take 1 tablet (81 mg total) by mouth daily.   carvedilol (COREG) 12.5 MG tablet Take 1 tablet (12.5 mg total) by mouth 2 (two) times daily.   colchicine 0.6 MG tablet Take 0.6 mg by mouth daily as needed (Gout).   dapagliflozin propanediol (FARXIGA) 10 MG TABS tablet Take 1 tablet (10 mg total) by mouth daily.   escitalopram (LEXAPRO) 10 MG tablet Take 10 mg by mouth every morning.    ezetimibe (ZETIA) 10 MG tablet TAKE 1 TABLET BY MOUTH EVERY DAY   furosemide (LASIX) 40 MG tablet Take 1 tablet (40 mg total) by mouth daily.   gabapentin (NEURONTIN) 100 MG capsule Take 100 mg by mouth 2 (two) times daily.   ibuprofen (ADVIL) 200 MG tablet Take 800 mg by mouth every 8 (eight) hours as needed for headache or moderate pain.   indomethacin (INDOCIN) 50 MG capsule Take 50 mg by mouth daily as needed for mild pain or moderate pain (Gout).   isosorbide mononitrate (IMDUR) 30 MG 24 hr tablet Take 1 tablet (30 mg total) by mouth daily.   metFORMIN (GLUCOPHAGE-XR) 500 MG 24 hr tablet Take 2 tablets (1,000 mg total) by mouth in the morning and at bedtime.   mexiletine (MEXITIL) 200 MG capsule Take 1 capsule (200 mg total) by mouth 2 (two) times  daily. Please make overdue appt with Dr. Curt Bears before anymore refills. Thank you 3rd and Final Attempt   nitroGLYCERIN (NITROSTAT) 0.4 MG SL tablet Place 0.4 mg under the tongue every 5 (five) minutes as needed for chest pain.   omeprazole (PRILOSEC) 40 MG capsule Take 1 capsule (40 mg total) by mouth daily.   Polyethylene Glycol 400 (BLINK TEARS OP) Place 2 drops into both eyes 4 (four) times daily as needed (for dry eyes).   potassium chloride SA (KLOR-CON M) 20 MEQ tablet Take 1 tablet (20 mEq total) by mouth daily.   ranolazine (RANEXA) 500 MG 12 hr tablet Take 500 mg by mouth 2 (two) times daily.   rosuvastatin (CRESTOR) 40 MG tablet Take 1 tablet (40 mg total) by mouth every evening.   Semaglutide,0.25 or 0.'5MG'$ /DOS, (OZEMPIC, 0.25 OR 0.5 MG/DOSE,) 2 MG/1.5ML SOPN Inject 0.25 mg into the skin once a week.     Allergies:   Patient has no known allergies.   Social History   Socioeconomic History   Marital status: Married    Spouse name: Not on file   Number of children: Not on file   Years of education: Not on file   Highest education level: Not on file  Occupational History   Not on file  Tobacco Use   Smoking status: Never    Smokeless tobacco: Never  Vaping Use   Vaping Use: Never used  Substance and Sexual Activity   Alcohol use: No   Drug use: Never   Sexual activity: Not on file  Other Topics Concern   Not on file  Social History Narrative   Not on file   Social Determinants of Health   Financial Resource Strain: Not on file  Food Insecurity: Not on file  Transportation Needs: Not on file  Physical Activity: Not on file  Stress: Not on file  Social Connections: Not on file     Family History: The patient's family history includes Brain cancer in his mother; Diabetes Mellitus II in his brother and paternal grandfather; Lung cancer in his mother; Lymphoma in his father. ROS:   Please see the history of present illness.    All 14 point review of systems negative except as described per history of present illness  EKGs/Labs/Other Studies Reviewed:      Recent Labs: 11/27/2021: ALT 41 11/28/2021: Magnesium 2.0 04/10/2022: B Natriuretic Peptide 172.3; BUN 9; Creatinine, Ser 0.88; Hemoglobin 14.9; Platelets 233; Potassium 4.0; Sodium 135  Recent Lipid Panel    Component Value Date/Time   CHOL 172 11/28/2021 0030   CHOL 103 03/21/2021 0923   TRIG 244 (H) 11/28/2021 0030   HDL 27 (L) 11/28/2021 0030   HDL 31 (L) 03/21/2021 0923   CHOLHDL 6.4 11/28/2021 0030   VLDL 49 (H) 11/28/2021 0030   LDLCALC 96 11/28/2021 0030   LDLCALC 34 03/21/2021 0923    Physical Exam:    VS:  BP (!) 142/94 (BP Location: Left Arm, Patient Position: Sitting, Cuff Size: Large)   Pulse (!) 105   Ht '6\' 3"'$  (1.905 m)   Wt (!) 354 lb (160.6 kg)   SpO2 96%   BMI 44.25 kg/m     Wt Readings from Last 3 Encounters:  05/10/22 (!) 354 lb (160.6 kg)  04/10/22 (!) 353 lb (160.1 kg)  04/10/22 (!) 343 lb (155.6 kg)     GEN:  Well nourished, well developed in no acute distress HEENT: Normal NECK: No JVD; No carotid bruits LYMPHATICS: No lymphadenopathy CARDIAC:  RRR, no murmurs, no rubs, no gallops RESPIRATORY:  Clear  to auscultation without rales, wheezing or rhonchi  ABDOMEN: Soft, non-tender, non-distended MUSCULOSKELETAL:  No edema; No deformity  SKIN: Warm and dry LOWER EXTREMITIES: Minimal swelling NEUROLOGIC:  Alert and oriented x 3 PSYCHIATRIC:  Normal affect   ASSESSMENT:    1. Ischemic cardiomyopathy   2. Right bundle branch block   3. Coronary artery disease involving native coronary artery of native heart without angina pectoris   4. CAD S/P percutaneous coronary angioplasty   5. Class 3 severe obesity due to excess calories without serious comorbidity in adult, unspecified BMI (HCC)    PLAN:    In order of problems listed above:  Ischemic cardiomyopathy, last assessment of ejection fraction done in June which showed ejection fraction 30 to 35%, he was on guideline directed medical therapy which included Entresto, only 24-26 twice daily because of low blood pressure, carvedilol 12.5 twice daily, dapagliflozin 10 mg daily, ran out of Entresto.  Will give him samples and will talk to insurance company make sure that that medication will be approved. Right bundle branch block, noted continue monitoring Coronary artery disease stable from that point he is on antiplatelet therapy without symptoms indicating reactivation of the problem last cardiac catheterization reviewed, no target lesion for intervention Obesity: Obviously still acute problem he understands he is trying to work on losing weight. Congestive heart failure looks only mildly decompensated on physical exam.  Will check Chem-7 today, proBNP anticipate need to give him a little more Lasix, will put him back on Entresto ICD present which is a [employer].  I did review interrogation, he had 2.8 years left on the device.,  92% of BiV pacing, which is probably caused by frequent ventricular ectopy, that being treated with mexiletine, no therapy delivered.   Medication Adjustments/Labs and Tests Ordered: Current medicines are  reviewed at length with the patient today.  Concerns regarding medicines are outlined above.  No orders of the defined types were placed in this encounter.  Medication changes: No orders of the defined types were placed in this encounter.   Signed, Park Liter, MD, [facility] 05/10/2022 8:45 AM    [place]

## 2022-05-13 LAB — BASIC METABOLIC PANEL
BUN/Creatinine Ratio: 13 (ref 9–20)
BUN: 11 mg/dL (ref 6–24)
CO2: 24 mmol/L (ref 20–29)
Calcium: 8.8 mg/dL (ref 8.7–10.2)
Chloride: 100 mmol/L (ref 96–106)
Creatinine, Ser: 0.87 mg/dL (ref 0.76–1.27)
Glucose: 248 mg/dL — ABNORMAL HIGH (ref 70–99)
Potassium: 4.4 mmol/L (ref 3.5–5.2)
Sodium: 139 mmol/L (ref 134–144)
eGFR: 104 mL/min/{1.73_m2} (ref >59)

## 2022-05-13 LAB — PRO B NATRIURETIC PEPTIDE: NT-Pro BNP: 475 pg/mL — ABNORMAL HIGH (ref 0–121)

## 2022-05-16 ENCOUNTER — Encounter: Payer: Self-pay | Admitting: Cardiology

## 2022-05-16 ENCOUNTER — Telehealth: Payer: Self-pay

## 2022-05-16 NOTE — Telephone Encounter (Signed)
Prior Auth for [employer] initiated and sent to [employer]

## 2022-05-17 NOTE — Telephone Encounter (Signed)
Spoke with pt regarding his note. Per Dr. Agustin Cree - Advised to take an extre 1/2 of Lasix for 2 days in a row to see if this helps. Pt agreed and stated that he would call with an update on  his symptoms.

## 2022-05-19 ENCOUNTER — Encounter (HOSPITAL_COMMUNITY): Payer: 59

## 2022-05-19 NOTE — Progress Notes (Incomplete)
ADVANCED HF CLINIC NOTE  Referring Physician: Agustin Cree Primary Care: Cyndi Bender Primary Cardiologist: Agustin Cree   HPI:  Joe [name] is a 51 y/o male with h/o morbid obesity, DM2, HTN, HL, OSA, CAD and systolic HF (EF 75-64%) referred by Dr. Agustin Cree for further management of his HF   Has h/o RBBB but never had any other heart problems until 3/19. Saw Dr. Drue Dun in 3/19 due to CP and SOB. Cath EF 25-35% Severe diabetic CAD with LAD disease (m90 and m75),  RCA d50, LCA m50.   Echo 7/20 EF 20-25% moderate RV dysfunction  Cath January 2020 which showed 40% distal LAD, LAD/LCx stents were patent, mid RCA got 25% lesion right PDA had 50% stenosis the circumflex artery were open.  No intervention has been required.    Had cath 1/22 with stable CAD  Myoview 12/19 EF 30% Apical and infero-apical scar  Had St. Jude CRT-D placed 03/19/19 with Dr. Curt Bears.  Admitted 9/22 for CP. HsTn was 4. Underwent cardiac cath on 03/07/2021 which showed patent stents in the LAD and LCX with diffuse distal LAD which may be a trigger for angina as well as mildly elevated RA pressure with normal PCWP and LVEDP and preserved cardiac output.   Echo 9/22 EF 35-40%  Admitted 6/23 with CP. Hstrop 35. Felt to be non-cardiac. Echo EF 30-35%  Here with his wife. Says he has been doing ok recently. Can do ADLs without too much problem. Just takes his time. But does get SOB easily. Over past 3 days more SOB. Was in the ER overnight due to recurrent SOB. Found to be volume overloaded. BNP only 172. Hst trop 37->47. CXR with pulmonary edema. Given IV lasix with > 2L out. Refused hospital admission. Having orthopnea or PND. Not sure if he swells. Has been in [place] 2-3x to get fluids back. Rare CP.   ReDs 43%    Past Medical History:  Diagnosis Date   Abnormal EKG    Acute systolic heart failure ([place]) 09/06/2017   Angina pectoris ([place]) 09/05/2017   Body mass index 45.0-49.9, adult (HCC)    CAD S/P  percutaneous coronary angioplasty 09/07/2017   mLAD and dLAD PCI with DES 09/06/17   CHF (congestive heart failure) (HCC)    Chronic systolic (congestive) heart failure ([name]) 3/32/9518   Complication of anesthesia 1995   "had hard time waking up; breathing w/vasectomy"   Coronary artery disease    Erectile dysfunction    Essential hypertension, benign    Fatigue    GERD (gastroesophageal reflux disease)    Gout    High cholesterol    "just started tx yesterday" (09/06/2017)   History of gout    "RX prn" (09/06/2017)   Ischemic cardiomyopathy 09/07/2017   EF 25-35%   Muscular chest pain    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   Below Left Nare (MOH's)   Obesity    Obstructive sleep apnea    OSA on CPAP    Plantar fasciitis    Pre-operative clearance 02/11/2018   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/02/2019   Saint Jude   Right bundle branch block    Shortness of breath 09/05/2017   Testosterone deficiency    Type 2 diabetes mellitus with complication, without long-term current use of insulin ([place]) 09/05/2017   Type II diabetes mellitus ([place])    "started tx 08/28/2017"    Current Outpatient Medications  Medication Sig Dispense Refill   acetaminophen (TYLENOL) 500 MG tablet  Take 1,000 mg by mouth every 6 (six) hours as needed for moderate pain or headache.     allopurinol (ZYLOPRIM) 100 MG tablet Take 100 mg by mouth daily as needed (for gout).      aspirin EC 81 MG tablet Take 1 tablet (81 mg total) by mouth daily. 90 tablet 3   carvedilol (COREG) 12.5 MG tablet Take 1 tablet (12.5 mg total) by mouth 2 (two) times daily. 180 tablet 2   colchicine 0.6 MG tablet Take 0.6 mg by mouth daily as needed (Gout).     dapagliflozin propanediol (FARXIGA) 10 MG TABS tablet Take 1 tablet (10 mg total) by mouth daily. 90 tablet 2   escitalopram (LEXAPRO) 10 MG tablet Take 10 mg by mouth every morning.     ezetimibe (ZETIA) 10 MG tablet TAKE 1 TABLET BY  MOUTH EVERY DAY 90 tablet 1   furosemide (LASIX) 40 MG tablet Take 1 tablet (40 mg total) by mouth daily. 90 tablet 1   gabapentin (NEURONTIN) 100 MG capsule Take 100 mg by mouth 2 (two) times daily.     ibuprofen (ADVIL) 200 MG tablet Take 800 mg by mouth every 8 (eight) hours as needed for headache or moderate pain.     indomethacin (INDOCIN) 50 MG capsule Take 50 mg by mouth daily as needed for mild pain or moderate pain (Gout).     isosorbide mononitrate (IMDUR) 30 MG 24 hr tablet Take 1 tablet (30 mg total) by mouth daily. 90 tablet 3   metFORMIN (GLUCOPHAGE-XR) 500 MG 24 hr tablet Take 2 tablets (1,000 mg total) by mouth in the morning and at bedtime.     mexiletine (MEXITIL) 200 MG capsule Take 1 capsule (200 mg total) by mouth 2 (two) times daily. Please make overdue appt with Dr. Curt Bears before anymore refills. Thank you 3rd and Final Attempt 180 capsule 3   nitroGLYCERIN (NITROSTAT) 0.4 MG SL tablet Place 0.4 mg under the tongue every 5 (five) minutes as needed for chest pain.     omeprazole (PRILOSEC) 40 MG capsule Take 1 capsule (40 mg total) by mouth daily. 90 capsule 1   Polyethylene Glycol 400 (BLINK TEARS OP) Place 2 drops into both eyes 4 (four) times daily as needed (for dry eyes).     potassium chloride SA (KLOR-CON M) 20 MEQ tablet Take 1 tablet (20 mEq total) by mouth daily. 90 tablet 3   ranolazine (RANEXA) 500 MG 12 hr tablet Take 500 mg by mouth 2 (two) times daily.     rosuvastatin (CRESTOR) 40 MG tablet Take 1 tablet (40 mg total) by mouth every evening. 90 tablet 3   sacubitril-valsartan (ENTRESTO) 24-26 MG Take 1 tablet by mouth 2 (two) times daily. (Patient not taking: Reported on 05/10/2022) 60 tablet 2   Semaglutide,0.25 or 0.'5MG'$ /DOS, (OZEMPIC, 0.25 OR 0.5 MG/DOSE,) 2 MG/1.5ML SOPN Inject 0.25 mg into the skin once a week.     No current facility-administered medications for this visit.    No Known Allergies    Social History   Socioeconomic History   Marital  status: Married    Spouse name: Not on file   Number of children: Not on file   Years of education: Not on file   Highest education level: Not on file  Occupational History   Not on file  Tobacco Use   Smoking status: Never   Smokeless tobacco: Never  Vaping Use   Vaping Use: Never used  Substance and Sexual Activity  Alcohol use: No   Drug use: Never   Sexual activity: Not on file  Other Topics Concern   Not on file  Social History Narrative   Not on file   Social Determinants of Health   Financial Resource Strain: Not on file  Food Insecurity: Not on file  Transportation Needs: Not on file  Physical Activity: Not on file  Stress: Not on file  Social Connections: Not on file  Intimate Partner Violence: Not on file      Family History  Problem Relation Age of Onset   Lymphoma Father    Diabetes Mellitus II Brother    Diabetes Mellitus II Paternal Grandfather    Lung cancer Mother    Brain cancer Mother     There were no vitals filed for this visit.  Wt Readings from Last 3 Encounters:  05/10/22 (!) 160.6 kg (354 lb)  04/10/22 (!) 160.1 kg (353 lb)  04/10/22 (!) 155.6 kg (343 lb)      PHYSICAL EXAM: General:  Obese male No resp difficulty HEENT: normal Neck: supple. Hard to see jvp Carotids 2+ bilat; no bruits. No lymphadenopathy or thryomegaly appreciated. Cor: PMI nondisplaced. Regular rate & rhythm. No rubs, gallops or murmurs. Lungs: clear Abdomen: obese soft, nontender, nondistended. No hepatosplenomegaly. No bruits or masses. Good bowel sounds. Extremities: no cyanosis, clubbing, rash, 1-2+ edema Neuro: alert & orientedx3, cranial nerves grossly intact. moves all 4 extremities w/o difficulty. Affect pleasant    ASSESSMENT & PLAN:  1. Chronic systolic HF - due to iCM - 8/20 Echo EF 20-25%  - Echo 9/22 35-40% - Echo 6/23 EF 30-35% - s/p STJ CRT-D Device interrogated in clinic. Fluid ok. No VT/AF  100% pacing - Stable NYHA III - Volume  status up on lasix 40 daily. Previously decreased due to overdiuresis on lasix 80 daily.  - ReDS 43% - Will give Furoscix x 3 days. Then increase lasix to 80 daily alternating with 40 daily.  - Continue Entresto 24/26 bid (recently decreased form 97/103 bid) - Continue spiro 12.5  - Continue carvedilol 6.25 bid (recently decreased from 9.375 bid) - Continue Farxiga 10.  - labs from ER reviewed personally.  - Will need f/u in 2-3 weeks   2. CAD with chronic stable angina - s/p multiple stents to LAD and LCX - cath 9/22 stable CAD - CP under good control  - Followed Dr. Raliegh Ip. - Continue Imdur and Ranexa.  3. DM2 - Continue Farxiga  4. OSA - not using CPAP -says machine was recalled a while ago - will schedule f/u with Dr Radford Pax  5. Severe obesity - has struggled with weight loss - continue Ozempic  6. 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 Followed by Dr. Curt Bears  Total time spent 40 minutes. Over half that time spent discussing above.    Rafael Bihari, FNP  10:06 AM

## 2022-05-19 NOTE — Telephone Encounter (Signed)
Spoke with pt. Pt advised per Dr. Agustin Cree to increase Lasix by 1/2 tablet for 2 days in a row. Pt agreed and stated that he would update [place] on his symptoms.

## 2022-05-20 NOTE — Progress Notes (Signed)
Remote ICD transmission.   

## 2022-07-11 ENCOUNTER — Ambulatory Visit: Payer: 59 | Admitting: Podiatry

## 2022-07-11 DIAGNOSIS — E1142 Type 2 diabetes mellitus with diabetic polyneuropathy: Secondary | ICD-10-CM | POA: Diagnosis not present

## 2022-07-11 DIAGNOSIS — I739 Peripheral vascular disease, unspecified: Secondary | ICD-10-CM

## 2022-07-11 DIAGNOSIS — L84 Corns and callosities: Secondary | ICD-10-CM

## 2022-07-11 LAB — LAB REPORT - SCANNED
A1c: 7.2
Creatinine, POC: 34.9 mg/dL
EGFR: 87

## 2022-07-11 NOTE — Progress Notes (Signed)
Subjective:  Patient ID: Joe [name], male    DOB: 06/19/1971,  MRN: [id number]  Chief Complaint  Patient presents with   Foot Problem    damage to hallux on left foot. Patient is diabetic A1C 7.2 (yesterday)    52 y.o. male presents with concern for prior wound and callus to the bottom of the left great toe.  He says that he was on a cruise around [place] a few weeks ago and had a wound develop after callus may have pulled off of his bottom of his left great toe had some bleeding and wound develop.  He saw the doctor on the cruise who told him to put antibiotic ointment on it and wrapped the area.  He says it is healed well since he started doing that but he want to follow-up to make sure it was doing okay.  Denies any drainage from the area recently.  Past Medical History:  Diagnosis Date   Abnormal EKG    Acute systolic heart failure ([place]) 09/06/2017   Angina pectoris ([place]) 09/05/2017   Body mass index 45.0-49.9, adult (HCC)    CAD S/P percutaneous coronary angioplasty 09/07/2017   mLAD and dLAD PCI with DES 09/06/17   CHF (congestive heart failure) (HCC)    Chronic systolic (congestive) heart failure ([place]) 5/95/6387   Complication of anesthesia 1995   "had hard time waking up; breathing w/vasectomy"   Coronary artery disease    Erectile dysfunction    Essential hypertension, benign    Fatigue    GERD (gastroesophageal reflux disease)    Gout    High cholesterol    "just started tx yesterday" (09/06/2017)   History of gout    "RX prn" (09/06/2017)   Ischemic cardiomyopathy 09/07/2017   EF 25-35%   Muscular chest pain    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   Below Left Nare (MOH's)   Obesity    Obstructive sleep apnea    OSA on CPAP    Plantar fasciitis    Pre-operative clearance 02/11/2018   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/02/2019   Saint Jude   Right bundle branch block    Shortness of breath 09/05/2017    Testosterone deficiency    Type 2 diabetes mellitus with complication, without long-term current use of insulin ([place]) 09/05/2017   Type II diabetes mellitus ([place])    "started tx 08/28/2017"    No Known Allergies  ROS: Negative except as per HPI above  Objective:  General: AAO x3, NAD  Dermatological: Attention directed the plantar aspect of the left hallux there is noted to be a hyperkeratotic lesion overlying prior healed ulceration at the plantar left hallux.  There is no erythema edema or drainage at this time.    Vascular:  Dorsalis Pedis artery and Posterior Tibial artery pedal pulses are 0/4 bilateral.  Capillary fill time < 3 sec to all digits.   Neruologic: Grossly intact via light touch bilateral. Protective threshold intact to all sites bilateral.   Musculoskeletal: No gross boney pedal deformities bilateral. No pain, crepitus, or limitation noted with foot and ankle range of motion bilateral. Muscular strength 5/5 in all groups tested bilateral.  Gait: Unassisted, Nonantalgic.   No images are attached to the encounter.  Radiographs:  Deferred at this visit Assessment:   1. Pre-ulcerative calluses   2. PAD (peripheral artery disease) ([place])   3. DM type 2 with diabetic peripheral neuropathy ([place])      Plan:  Patient was evaluated and treated and all questions answered.  # Preulcerative callus and prior healed ulceration of the plantar aspect of the left hallux # Peripheral arterial disease related to diabetes type 2 -Discussed with the patient that overall the ulceration that he previously had is healed well with hyperkeratotic lesion that has formed overlying. All symptomatic hyperkeratoses x 1 at the plantar aspect of the left hallux were safely debrided with a sterile #15 blade to patient's level of comfort without incident. We discussed preventative and palliative care of these lesions including supportive and accommodative shoegear, padding, prefabricated and  custom molded accommodative orthoses, use of a pumice stone and lotions/creams daily. -I recommend continued monitoring of the area and avoidance of pressure on it to prevent reformation of the wound.  Fortunately appears well-healed without any sign of infection at this time. -Would like to send the patient for preventative testing of his ABI PVR studies as he does have nonpalpable pulses in the left lower extremity.   Return in about 3 months (around 10/10/2022) for [facility].          Everitt Amber, DPM [facility]

## 2022-07-12 ENCOUNTER — Ambulatory Visit: Payer: 59 | Attending: Podiatry

## 2022-07-12 DIAGNOSIS — L84 Corns and callosities: Secondary | ICD-10-CM | POA: Diagnosis not present

## 2022-07-12 DIAGNOSIS — E1142 Type 2 diabetes mellitus with diabetic polyneuropathy: Secondary | ICD-10-CM | POA: Diagnosis not present

## 2022-07-12 DIAGNOSIS — I739 Peripheral vascular disease, unspecified: Secondary | ICD-10-CM | POA: Diagnosis not present

## 2022-07-12 LAB — VAS US ABI WITH/WO TBI
Left ABI: 1.27
Right ABI: 1.28

## 2022-07-15 ENCOUNTER — Other Ambulatory Visit: Payer: Self-pay | Admitting: Cardiology

## 2022-07-16 ENCOUNTER — Other Ambulatory Visit: Payer: Self-pay | Admitting: Cardiology

## 2022-07-17 ENCOUNTER — Ambulatory Visit: Payer: 59

## 2022-07-17 DIAGNOSIS — I5022 Chronic systolic (congestive) heart failure: Secondary | ICD-10-CM

## 2022-07-17 DIAGNOSIS — I255 Ischemic cardiomyopathy: Secondary | ICD-10-CM | POA: Diagnosis not present

## 2022-07-18 LAB — CUP PACEART REMOTE DEVICE CHECK
Battery Remaining Longevity: 42 mo
Battery Remaining Percentage: 53 %
Battery Voltage: 2.95 V
Brady Statistic AP VP Percent: 2.7 %
Brady Statistic AP VS Percent: 1 %
Brady Statistic AS VP Percent: 91 %
Brady Statistic AS VS Percent: 4.3 %
Brady Statistic RA Percent Paced: 1.2 %
Date Time Interrogation Session: 20240129221546
HighPow Impedance: 81 Ohm
HighPow Impedance: 81 Ohm
Implantable Lead Connection Status: 753985
Implantable Lead Connection Status: 753985
Implantable Lead Connection Status: 753985
Implantable Lead Implant Date: 20200930
Implantable Lead Implant Date: 20200930
Implantable Lead Implant Date: 20200930
Implantable Lead Location: 753858
Implantable Lead Location: 753859
Implantable Lead Location: 753860
Implantable Pulse Generator Implant Date: 20200930
Lead Channel Impedance Value: 380 Ohm
Lead Channel Impedance Value: 460 Ohm
Lead Channel Impedance Value: 940 Ohm
Lead Channel Pacing Threshold Amplitude: 0.5 V
Lead Channel Pacing Threshold Amplitude: 0.75 V
Lead Channel Pacing Threshold Amplitude: 1 V
Lead Channel Pacing Threshold Pulse Width: 0.5 ms
Lead Channel Pacing Threshold Pulse Width: 0.5 ms
Lead Channel Pacing Threshold Pulse Width: 0.5 ms
Lead Channel Sensing Intrinsic Amplitude: 11.8 mV
Lead Channel Sensing Intrinsic Amplitude: 2.1 mV
Lead Channel Setting Pacing Amplitude: 2 V
Lead Channel Setting Pacing Amplitude: 2.5 V
Lead Channel Setting Pacing Amplitude: 2.5 V
Lead Channel Setting Pacing Pulse Width: 0.5 ms
Lead Channel Setting Pacing Pulse Width: 0.5 ms
Lead Channel Setting Sensing Sensitivity: 0.5 mV
Pulse Gen Serial Number: 9901124
Zone Setting Status: 755011

## 2022-07-24 ENCOUNTER — Telehealth (HOSPITAL_COMMUNITY): Payer: Self-pay

## 2022-07-24 NOTE — Telephone Encounter (Signed)
Can you review for this patient? [employer] called and stated he had an infuser that did not work and they wanted a verbal "ok" to send replacement out. The only one I see is from November. Can you review and advise please.

## 2022-07-24 NOTE — Progress Notes (Incomplete)
ADVANCED HF CLINIC NOTE  Referring Physician: Agustin Cree Primary Care: Cyndi Bender Primary Cardiologist: Agustin Cree   HPI:  Joe [name] is a 52 y/o male with h/o morbid obesity, DM2, HTN, HL, OSA, CAD and systolic HF (EF 16-07%) referred by Dr. Agustin Cree for further management of his HF   Has h/o RBBB but never had any other heart problems until 3/19. Saw Dr. Drue Dun in 3/19 due to CP and SOB. Cath EF 25-35% Severe diabetic CAD with LAD disease (m90 and m75),  RCA d50, LCA m50.   Echo 7/20 EF 20-25% moderate RV dysfunction  Cath January 2020 which showed 40% distal LAD, LAD/LCx stents were patent, mid RCA got 25% lesion right PDA had 50% stenosis the circumflex artery were open.  No intervention has been required.    Had cath 1/22 with stable CAD  Myoview 12/19 EF 30% Apical and infero-apical scar  Had St. Jude CRT-D placed 03/19/19 with Dr. Curt Bears.  Admitted 9/22 for CP. HsTn was 4. Underwent cardiac cath on 03/07/2021 which showed patent stents in the LAD and LCX with diffuse distal LAD which may be a trigger for angina as well as mildly elevated RA pressure with normal PCWP and LVEDP and preserved cardiac output.   Echo 9/22 EF 35-40%  Admitted 6/23 with CP. Hstrop 35. Felt to be non-cardiac. Echo EF 30-35%  Here with his wife. Says he has been doing ok recently. Can do ADLs without too much problem. Just takes his time. But does get SOB easily. Over past 3 days more SOB. Was in the ER overnight due to recurrent SOB. Found to be volume overloaded. BNP only 172. Hst trop 37->47. CXR with pulmonary edema. Given IV lasix with > 2L out. Refused hospital admission. Having orthopnea or PND. Not sure if he swells. Has been in [place] 2-3x to get fluids back. Rare CP.   ReDs 43%    Past Medical History:  Diagnosis Date   Abnormal EKG    Acute systolic heart failure ([place]) 09/06/2017   Angina pectoris ([place]) 09/05/2017   Body mass index 45.0-49.9, adult (HCC)    CAD S/P  percutaneous coronary angioplasty 09/07/2017   mLAD and dLAD PCI with DES 09/06/17   CHF (congestive heart failure) (HCC)    Chronic systolic (congestive) heart failure ([place]) 3/71/0626   Complication of anesthesia 1995   "had hard time waking up; breathing w/vasectomy"   Coronary artery disease    Erectile dysfunction    Essential hypertension, benign    Fatigue    GERD (gastroesophageal reflux disease)    Gout    High cholesterol    "just started tx yesterday" (09/06/2017)   History of gout    "RX prn" (09/06/2017)   Ischemic cardiomyopathy 09/07/2017   EF 25-35%   Muscular chest pain    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   Below Left Nare (MOH's)   Obesity    Obstructive sleep apnea    OSA on CPAP    Plantar fasciitis    Pre-operative clearance 02/11/2018   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/02/2019   [facility] Jude   Right bundle branch block    Shortness of breath 09/05/2017   Testosterone deficiency    Type 2 diabetes mellitus with complication, without long-term current use of insulin ([place]) 09/05/2017   Type II diabetes mellitus ([place])    "started tx 08/28/2017"    Current Outpatient Medications  Medication Sig Dispense Refill   acetaminophen (TYLENOL) 500 MG tablet  Take 1,000 mg by mouth every 6 (six) hours as needed for moderate pain or headache.     allopurinol (ZYLOPRIM) 100 MG tablet Take 100 mg by mouth daily as needed (for gout).      aspirin EC 81 MG tablet Take 1 tablet (81 mg total) by mouth daily. 90 tablet 3   carvedilol (COREG) 12.5 MG tablet Take 1 tablet (12.5 mg total) by mouth 2 (two) times daily. 180 tablet 2   colchicine 0.6 MG tablet Take 0.6 mg by mouth daily as needed (Gout).     dapagliflozin propanediol (FARXIGA) 10 MG TABS tablet Take 1 tablet (10 mg total) by mouth daily. 90 tablet 2   escitalopram (LEXAPRO) 10 MG tablet Take 10 mg by mouth every morning.     ezetimibe (ZETIA) 10 MG tablet TAKE 1 TABLET BY  MOUTH EVERY DAY 90 tablet 1   furosemide (LASIX) 40 MG tablet Take 1 tablet (40 mg total) by mouth daily. 90 tablet 1   gabapentin (NEURONTIN) 100 MG capsule Take 100 mg by mouth 2 (two) times daily.     ibuprofen (ADVIL) 200 MG tablet Take 800 mg by mouth every 8 (eight) hours as needed for headache or moderate pain.     indomethacin (INDOCIN) 50 MG capsule Take 50 mg by mouth daily as needed for mild pain or moderate pain (Gout).     isosorbide mononitrate (IMDUR) 30 MG 24 hr tablet Take 1 tablet (30 mg total) by mouth daily. 90 tablet 3   metFORMIN (GLUCOPHAGE-XR) 500 MG 24 hr tablet Take 2 tablets (1,000 mg total) by mouth in the morning and at bedtime.     mexiletine (MEXITIL) 200 MG capsule Take 1 capsule (200 mg total) by mouth 2 (two) times daily. Please make overdue appt with Dr. Curt Bears before anymore refills. Thank you 3rd and Final Attempt 180 capsule 3   nitroGLYCERIN (NITROSTAT) 0.4 MG SL tablet Place 0.4 mg under the tongue every 5 (five) minutes as needed for chest pain.     omeprazole (PRILOSEC) 40 MG capsule Take 1 capsule (40 mg total) by mouth daily. 90 capsule 1   Polyethylene Glycol 400 (BLINK TEARS OP) Place 2 drops into both eyes 4 (four) times daily as needed (for dry eyes).     potassium chloride SA (KLOR-CON M) 20 MEQ tablet Take 1 tablet (20 mEq total) by mouth daily. 90 tablet 3   ranolazine (RANEXA) 500 MG 12 hr tablet Take 1 tablet (500 mg total) by mouth 2 (two) times daily. 180 tablet 2   rosuvastatin (CRESTOR) 40 MG tablet Take 1 tablet (40 mg total) by mouth daily. 90 tablet 2   sacubitril-valsartan (ENTRESTO) 24-26 MG Take 1 tablet by mouth 2 (two) times daily. (Patient not taking: Reported on 05/10/2022) 60 tablet 2   Semaglutide,0.25 or 0.'5MG'$ /DOS, (OZEMPIC, 0.25 OR 0.5 MG/DOSE,) 2 MG/1.5ML SOPN Inject 0.25 mg into the skin once a week.     No current facility-administered medications for this visit.    No Known Allergies    Social History   Socioeconomic  History   Marital status: Married    Spouse name: Not on file   Number of children: Not on file   Years of education: Not on file   Highest education level: Not on file  Occupational History   Not on file  Tobacco Use   Smoking status: Never   Smokeless tobacco: Never  Vaping Use   Vaping Use: Never used  Substance and Sexual  Activity   Alcohol use: No   Drug use: Never   Sexual activity: Not on file  Other Topics Concern   Not on file  Social History Narrative   Not on file   Social Determinants of Health   Financial Resource Strain: Not on file  Food Insecurity: Not on file  Transportation Needs: Not on file  Physical Activity: Not on file  Stress: Not on file  Social Connections: Not on file  Intimate Partner Violence: Not on file      Family History  Problem Relation Age of Onset   Lymphoma Father    Diabetes Mellitus II Brother    Diabetes Mellitus II Paternal Grandfather    Lung cancer Mother    Brain cancer Mother     There were no vitals filed for this visit.  Wt Readings from Last 3 Encounters:  05/10/22 (!) 160.6 kg (354 lb)  04/10/22 (!) 160.1 kg (353 lb)  04/10/22 (!) 155.6 kg (343 lb)      PHYSICAL EXAM: General:  Obese male No resp difficulty HEENT: normal Neck: supple. Hard to see jvp Carotids 2+ bilat; no bruits. No lymphadenopathy or thryomegaly appreciated. Cor: PMI nondisplaced. Regular rate & rhythm. No rubs, gallops or murmurs. Lungs: clear Abdomen: obese soft, nontender, nondistended. No hepatosplenomegaly. No bruits or masses. Good bowel sounds. Extremities: no cyanosis, clubbing, rash, 1-2+ edema Neuro: alert & orientedx3, cranial nerves grossly intact. moves all 4 extremities w/o difficulty. Affect pleasant    ASSESSMENT & PLAN:  1. Chronic systolic HF - due to iCM - 8/20 Echo EF 20-25%  - Echo 9/22 35-40% - Echo 6/23 EF 30-35% - s/p STJ CRT-D Device interrogated in clinic. Fluid ok. No VT/AF  100% pacing - Stable NYHA  III - Volume status up on lasix 40 daily. Previously decreased due to overdiuresis on lasix 80 daily.  - ReDS 43% - Will give Furoscix x 3 days. Then increase lasix to 80 daily alternating with 40 daily.  - Continue Entresto 24/26 bid (recently decreased form 97/103 bid) - Continue spiro 12.5  - Continue carvedilol 6.25 bid (recently decreased from 9.375 bid) - Continue Farxiga 10.  - labs from ER reviewed personally.  - Will need f/u in 2-3 weeks   2. CAD with chronic stable angina - s/p multiple stents to LAD and LCX - cath 9/22 stable CAD - CP under good control  - Followed Dr. Raliegh Ip. - Continue Imdur and Ranexa.  3. DM2 - Continue Farxiga  4. OSA - not using CPAP -says machine was recalled a while ago - will schedule f/u with Dr Radford Pax  5. Severe obesity - has struggled with weight loss - continue Ozempic  6. 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 Followed by Dr. Curt Bears  Total time spent 40 minutes. Over half that time spent discussing above.    Rafael Bihari, FNP  5:09 PM

## 2022-07-25 ENCOUNTER — Encounter (HOSPITAL_COMMUNITY): Payer: 59

## 2022-08-10 ENCOUNTER — Ambulatory Visit: Payer: 59 | Attending: Cardiology | Admitting: Cardiology

## 2022-08-10 ENCOUNTER — Encounter: Payer: Self-pay | Admitting: Cardiology

## 2022-08-10 VITALS — BP 98/78 | HR 78 | Ht 75.0 in | Wt 348.2 lb

## 2022-08-10 DIAGNOSIS — R0609 Other forms of dyspnea: Secondary | ICD-10-CM

## 2022-08-10 DIAGNOSIS — I10 Essential (primary) hypertension: Secondary | ICD-10-CM

## 2022-08-10 DIAGNOSIS — I5022 Chronic systolic (congestive) heart failure: Secondary | ICD-10-CM | POA: Diagnosis not present

## 2022-08-10 DIAGNOSIS — G4733 Obstructive sleep apnea (adult) (pediatric): Secondary | ICD-10-CM

## 2022-08-10 DIAGNOSIS — I255 Ischemic cardiomyopathy: Secondary | ICD-10-CM

## 2022-08-10 DIAGNOSIS — E66813 Obesity, class 3: Secondary | ICD-10-CM

## 2022-08-10 DIAGNOSIS — E118 Type 2 diabetes mellitus with unspecified complications: Secondary | ICD-10-CM

## 2022-08-10 DIAGNOSIS — I2510 Atherosclerotic heart disease of native coronary artery without angina pectoris: Secondary | ICD-10-CM | POA: Diagnosis not present

## 2022-08-10 DIAGNOSIS — R079 Chest pain, unspecified: Secondary | ICD-10-CM

## 2022-08-10 DIAGNOSIS — R5383 Other fatigue: Secondary | ICD-10-CM

## 2022-08-10 DIAGNOSIS — Z9861 Coronary angioplasty status: Secondary | ICD-10-CM

## 2022-08-10 NOTE — Progress Notes (Signed)
Cardiology Office Note:    Date:  08/10/2022   ID:  Joe [name], DOB 1970-10-13, MRN [id number]  PCP:  Cyndi Bender, PA-C  Cardiologist:  Jenne Campus, MD    Referring MD: Cyndi Bender, PA-C   No chief complaint on file.   History of Present Illness:    Joe [name] is a 52 y.o. male   with past medical history significant for coronary artery disease in March 2019 he required stenting to mid to distal LAD after that he was doing quite nicely but then started getting symptoms again did have a cardiac catheterization in January 2021 which showed 40% stenosis of distal LAD stents were patent, mid RCA had about 25% lesion PDA also got to 50% stenosis.  Circumflex was normal.  At the same time he was noted to have diminished left ventricle ejection fraction with echocardiogram showing ejection fraction 20 to 25%.  Eventually on March 19, 2019 he did receive CRT-D.  After that echocardiogram performed showed ejection fraction 35 to 40%  In June 20, 2020 he had cardiac catheterization done for atypical chest pain however none did not have any target lesion for intervention.  He is being seen by our advanced congestive heart failure team and I appreciate their expertise. Comes today to my office for follow-up.  Overall he said today he got better today he tells me that he does have good days and bad days.  Days 1 of those bad days.  He said he is weak tired exhausted overall however he looks better.  He said he is able to do some work outside but then after that he have to pay for it being tired for the next few days.  Also described episode of some shortness of breath for which he takes some extra diuretic.  Past Medical History:  Diagnosis Date   Abnormal EKG    Acute systolic heart failure ([place]) 09/06/2017   Angina pectoris ([place]) 09/05/2017   Body mass index 45.0-49.9, adult (HCC)    CAD S/P percutaneous coronary angioplasty 09/07/2017   mLAD and dLAD PCI with DES  09/06/17   CHF (congestive heart failure) (HCC)    Chronic systolic (congestive) heart failure ([place]) [id number]   Complication of anesthesia 1995   "had hard time waking up; breathing w/vasectomy"   Coronary artery disease    Erectile dysfunction    Essential hypertension, benign    Fatigue    GERD (gastroesophageal reflux disease)    Gout    High cholesterol    "just started tx yesterday" (09/06/2017)   History of gout    "RX prn" (09/06/2017)   Ischemic cardiomyopathy 09/07/2017   EF 25-35%   Muscular chest pain    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   Below Left Nare (MOH's)   Obesity    Obstructive sleep apnea    OSA on CPAP    Plantar fasciitis    Pre-operative clearance 02/11/2018   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/02/2019   Saint Jude   Right bundle branch block    Shortness of breath 09/05/2017   Testosterone deficiency    Type 2 diabetes mellitus with complication, without long-term current use of insulin ([place]) 09/05/2017   Type II diabetes mellitus ([place])    "started tx 08/28/2017"    Past Surgical History:  Procedure Laterality Date   BACK SURGERY     BIV ICD INSERTION CRT-D N/A 03/19/2019   Procedure: BIV ICD INSERTION CRT-D;  Surgeon: Curt Bears,  Ocie Doyne, MD;  Location: [facility];  Service: Cardiovascular;  Laterality: N/A;   CORONARY ANGIOPLASTY WITH STENT PLACEMENT  09/06/2017   "3 stents"   CORONARY STENT INTERVENTION N/A 09/06/2017   Procedure: CORONARY STENT INTERVENTION;  Surgeon: Jettie Booze, MD;  Location: [facility];  Service: Cardiovascular;  Laterality: N/A;   CORONARY STENT INTERVENTION N/A 02/15/2018   Procedure: CORONARY STENT INTERVENTION;  Surgeon: Jettie Booze, MD;  Location: [facility];  Service: Cardiovascular;  Laterality: N/A;   KNEE ARTHROSCOPY Right    LEFT HEART CATH AND CORONARY ANGIOGRAPHY N/A 09/06/2017   Procedure: LEFT HEART CATH AND CORONARY  ANGIOGRAPHY;  Surgeon: Jettie Booze, MD;  Location: [facility];  Service: Cardiovascular;  Laterality: N/A;   LEFT HEART CATH AND CORONARY ANGIOGRAPHY N/A 02/15/2018   Procedure: LEFT HEART CATH AND CORONARY ANGIOGRAPHY;  Surgeon: Jettie Booze, MD;  Location: [facility];  Service: Cardiovascular;  Laterality: N/A;   LEFT HEART CATH AND CORONARY ANGIOGRAPHY N/A 06/27/2018   Procedure: LEFT HEART CATH AND CORONARY ANGIOGRAPHY;  Surgeon: Jettie Booze, MD;  Location: [facility];  Service: Cardiovascular;  Laterality: N/A;   LEFT HEART CATH AND CORONARY ANGIOGRAPHY N/A 06/28/2020   Procedure: LEFT HEART CATH AND CORONARY ANGIOGRAPHY;  Surgeon: Martinique, Peter M, MD;  Location: [facility];  Service: Cardiovascular;  Laterality: N/A;   LUMBAR SPINE SURGERY  ~ 2001   Intradiscal Electrothermoplasty   RIGHT/LEFT HEART CATH AND CORONARY ANGIOGRAPHY N/A 03/07/2021   Procedure: RIGHT/LEFT HEART CATH AND CORONARY ANGIOGRAPHY;  Surgeon: Larey Dresser, MD;  Location: [facility];  Service: Cardiovascular;  Laterality: N/A;   VASECTOMY  1995    Current Medications: Current Meds  Medication Sig   acetaminophen (TYLENOL) 500 MG tablet Take 1,000 mg by mouth every 6 (six) hours as needed for moderate pain or headache.   allopurinol (ZYLOPRIM) 100 MG tablet Take 100 mg by mouth daily as needed (for gout).    aspirin EC 81 MG tablet Take 1 tablet (81 mg total) by mouth daily.   carvedilol (COREG) 12.5 MG tablet Take 1 tablet (12.5 mg total) by mouth 2 (two) times daily.   colchicine 0.6 MG tablet Take 0.6 mg by mouth daily as needed (Gout).   dapagliflozin propanediol (FARXIGA) 10 MG TABS tablet Take 1 tablet (10 mg total) by mouth daily.   escitalopram (LEXAPRO) 10 MG tablet Take 10 mg by mouth every morning.   ezetimibe (ZETIA) 10 MG tablet TAKE 1 TABLET BY MOUTH EVERY DAY   furosemide (LASIX) 40 MG tablet Take 1 tablet (40 mg total) by mouth daily.    gabapentin (NEURONTIN) 100 MG capsule Take 100 mg by mouth 2 (two) times daily.   ibuprofen (ADVIL) 200 MG tablet Take 800 mg by mouth every 8 (eight) hours as needed for headache or moderate pain.   indomethacin (INDOCIN) 50 MG capsule Take 50 mg by mouth daily as needed for mild pain or moderate pain (Gout).   isosorbide mononitrate (IMDUR) 30 MG 24 hr tablet Take 1 tablet (30 mg total) by mouth daily.   metFORMIN (GLUCOPHAGE-XR) 500 MG 24 hr tablet Take 2 tablets (1,000 mg total) by mouth in the morning and at bedtime.   mexiletine (MEXITIL) 200 MG capsule Take 1 capsule (200 mg total) by mouth 2 (two) times daily. Please make overdue appt with Dr. Curt Bears before anymore refills. Thank you 3rd and Final  Attempt   nitroGLYCERIN (NITROSTAT) 0.4 MG SL tablet Place 0.4 mg under the tongue every 5 (five) minutes as needed for chest pain.   omeprazole (PRILOSEC) 40 MG capsule Take 1 capsule (40 mg total) by mouth daily.   Polyethylene Glycol 400 (BLINK TEARS OP) Place 2 drops into both eyes 4 (four) times daily as needed (for dry eyes).   potassium chloride SA (KLOR-CON M) 20 MEQ tablet Take 1 tablet (20 mEq total) by mouth daily.   ranolazine (RANEXA) 500 MG 12 hr tablet Take 1 tablet (500 mg total) by mouth 2 (two) times daily.   rosuvastatin (CRESTOR) 40 MG tablet Take 1 tablet (40 mg total) by mouth daily.   sacubitril-valsartan (ENTRESTO) 24-26 MG Take 1 tablet by mouth 2 (two) times daily.   Semaglutide,0.25 or 0.5MG/DOS, (OZEMPIC, 0.25 OR 0.5 MG/DOSE,) 2 MG/1.5ML SOPN Inject 1 mg into the skin once a week.     Allergies:   Patient has no known allergies.   Social History   Socioeconomic History   Marital status: Married    Spouse name: Not on file   Number of children: Not on file   Years of education: Not on file   Highest education level: Not on file  Occupational History   Not on file  Tobacco Use   Smoking status: Never   Smokeless tobacco: Never  Vaping Use   Vaping Use:  Never used  Substance and Sexual Activity   Alcohol use: No   Drug use: Never   Sexual activity: Not on file  Other Topics Concern   Not on file  Social History Narrative   Not on file   Social Determinants of Health   Financial Resource Strain: Not on file  Food Insecurity: Not on file  Transportation Needs: Not on file  Physical Activity: Not on file  Stress: Not on file  Social Connections: Not on file     Family History: The patient's family history includes Brain cancer in his mother; Diabetes Mellitus II in his brother and paternal grandfather; Lung cancer in his mother; Lymphoma in his father. ROS:   Please see the history of present illness.    All 14 point review of systems negative except as described per history of present illness  EKGs/Labs/Other Studies Reviewed:      Recent Labs: 11/27/2021: ALT 41 11/28/2021: Magnesium 2.0 04/10/2022: B Natriuretic Peptide 172.3; Hemoglobin 14.9; Platelets 233 05/10/2022: BUN 11; Creatinine, Ser 0.87; NT-Pro BNP 475; Potassium 4.4; Sodium 139  Recent Lipid Panel    Component Value Date/Time   CHOL 172 11/28/2021 0030   CHOL 103 03/21/2021 0923   TRIG 244 (H) 11/28/2021 0030   HDL 27 (L) 11/28/2021 0030   HDL 31 (L) 03/21/2021 0923   CHOLHDL 6.4 11/28/2021 0030   VLDL 49 (H) 11/28/2021 0030   LDLCALC 96 11/28/2021 0030   LDLCALC 34 03/21/2021 0923    Physical Exam:    VS:  BP 98/78 (BP Location: Right Arm, Patient Position: Sitting)   Pulse 78   Ht 6' 3"$  (1.905 m)   Wt (!) 348 lb 3.2 oz (157.9 kg)   SpO2 96%   BMI 43.52 kg/m     Wt Readings from Last 3 Encounters:  08/10/22 (!) 348 lb 3.2 oz (157.9 kg)  05/10/22 (!) 354 lb (160.6 kg)  04/10/22 (!) 353 lb (160.1 kg)     GEN:  Well nourished, well developed in no acute distress HEENT: Normal NECK: No JVD; No carotid bruits  LYMPHATICS: No lymphadenopathy CARDIAC: RRR, no murmurs, no rubs, no gallops RESPIRATORY:  Clear to auscultation without rales,  wheezing or rhonchi  ABDOMEN: Soft, non-tender, non-distended MUSCULOSKELETAL:  No edema; No deformity  SKIN: Warm and dry LOWER EXTREMITIES: no swelling NEUROLOGIC:  Alert and oriented x 3 PSYCHIATRIC:  Normal affect   ASSESSMENT:    1. Ischemic cardiomyopathy   2. CAD S/P percutaneous coronary angioplasty   3. Chronic systolic (congestive) heart failure ([place])   4. Essential hypertension, benign   5. Obstructive sleep apnea   6. Type 2 diabetes mellitus with complication, without long-term current use of insulin (HCC)   7. Class 3 severe obesity due to excess calories without serious comorbidity in adult, unspecified BMI (HCC)    PLAN:    In order of problems listed above:  Ischemic cardiomyopathy.  He is on maximal tolerated guideline directed medical therapy.  The problem is blood pressure being low.  He tells me that today he had a bad day.  I will check his troponin proBNP as well as complete metabolic panel.  Also will check his TSH.  He is scheduled to see our advanced congestive heart failure clinic next Monday. Coronary disease: Stable from that point review on appropriate medication which include antiplatelets therapy as well as antianginal therapy and statin in form of Crestor 40. Dyslipidemia I did review his K PN which surprisingly show me his LDL of 120.  I will check direct DL since he is taking maximum high intensity statin Crestor 40 and Zetia on top of it. Diabetes mellitus, I did review K PN which show me his hemoglobin A1c of 7.2 this is from July 10, 2022.  I asked him to follow-up with his primary care physician to Improvement in controlling of this problem. Morbid obesity clearly contributing factor he understand he is trying to work on weight loss   Medication Adjustments/Labs and Tests Ordered: Current medicines are reviewed at length with the patient today.  Concerns regarding medicines are outlined above.  No orders of the defined types were placed in this  encounter.  Medication changes: No orders of the defined types were placed in this encounter.   Signed, Park Liter, MD, [facility] 08/10/2022 3:13 PM    [place]

## 2022-08-10 NOTE — Patient Instructions (Signed)
Medication Instructions:  Your physician recommends that you continue on your current medications as directed. Please refer to the Current Medication list given to you today.  *If you need a refill on your cardiac medications before your next appointment, please call your pharmacy*   Lab Work:  TSH, ProBNP, CMP, Troponin I- today  If you have labs (blood work) drawn today and your tests are completely normal, you will receive your results only by: [place] (if you have MyChart) OR A paper copy in the mail If you have any lab test that is abnormal or we need to change your treatment, we will call you to review the results.   Testing/Procedures: None Ordered   Follow-Up: At [facility], you and your health needs are our priority.  As part of our continuing mission to provide you with exceptional heart care, we have created designated Provider Care Teams.  These Care Teams include your primary Cardiologist (physician) and Advanced Practice Providers (APPs -  Physician Assistants and Nurse Practitioners) who all work together to provide you with the care you need, when you need it.  We recommend signing up for the patient portal called "MyChart".  Sign up information is provided on this After Visit Summary.  MyChart is used to connect with patients for Virtual Visits (Telemedicine).  Patients are able to view lab/test results, encounter notes, upcoming appointments, etc.  Non-urgent messages can be sent to your provider as well.   To learn more about what you can do with MyChart, go to [email or web address].    Your next appointment:   5 month(s)  The format for your next appointment:   In Person  Provider:   Jenne Campus, MD    Other Instructions NA

## 2022-08-10 NOTE — Addendum Note (Signed)
Addended by: Jacobo Forest D on: 08/10/2022 03:39 PM   Modules accepted: Orders

## 2022-08-11 ENCOUNTER — Telehealth (HOSPITAL_COMMUNITY): Payer: Self-pay

## 2022-08-11 LAB — COMPREHENSIVE METABOLIC PANEL
ALT: 13 IU/L (ref 0–44)
AST: 15 IU/L (ref 0–40)
Albumin/Globulin Ratio: 1.7 (ref 1.2–2.2)
Albumin: 4.2 g/dL (ref 3.8–4.9)
Alkaline Phosphatase: 80 IU/L (ref 44–121)
BUN/Creatinine Ratio: 10 (ref 9–20)
BUN: 11 mg/dL (ref 6–24)
Bilirubin Total: 0.6 mg/dL (ref 0.0–1.2)
CO2: 24 mmol/L (ref 20–29)
Calcium: 8.9 mg/dL (ref 8.7–10.2)
Chloride: 102 mmol/L (ref 96–106)
Creatinine, Ser: 1.1 mg/dL (ref 0.76–1.27)
Globulin, Total: 2.5 g/dL (ref 1.5–4.5)
Glucose: 95 mg/dL (ref 70–99)
Potassium: 4.7 mmol/L (ref 3.5–5.2)
Sodium: 141 mmol/L (ref 134–144)
Total Protein: 6.7 g/dL (ref 6.0–8.5)
eGFR: 81 mL/min/{1.73_m2} (ref >59)

## 2022-08-11 LAB — TROPONIN T: Troponin T (Highly Sensitive): 12 ng/L (ref 0–22)

## 2022-08-11 LAB — TSH: TSH: 2.91 u[IU]/mL (ref 0.450–4.500)

## 2022-08-11 LAB — PRO B NATRIURETIC PEPTIDE: NT-Pro BNP: 351 pg/mL — ABNORMAL HIGH (ref 0–121)

## 2022-08-11 NOTE — Telephone Encounter (Signed)
Called to confirm/remind patient of their appointment at the [facility] on 08/14/22.   Patient reminded to bring all medications and/or complete list.  Confirmed patient has transportation. Gave directions, instructed to utilize [place] parking.  Confirmed appointment prior to ending call.

## 2022-08-14 ENCOUNTER — Ambulatory Visit (HOSPITAL_COMMUNITY)
Admission: RE | Admit: 2022-08-14 | Discharge: 2022-08-14 | Disposition: A | Discharge status: Home / Self Care | Payer: 59 | Source: Ambulatory Visit | Attending: Family Medicine | Admitting: Family Medicine

## 2022-08-14 ENCOUNTER — Encounter (HOSPITAL_COMMUNITY): Payer: Self-pay

## 2022-08-14 ENCOUNTER — Telehealth: Payer: Self-pay

## 2022-08-14 VITALS — BP 110/70 | HR 76 | Wt 336.0 lb

## 2022-08-14 DIAGNOSIS — Z7984 Long term (current) use of oral hypoglycemic drugs: Secondary | ICD-10-CM | POA: Diagnosis not present

## 2022-08-14 DIAGNOSIS — I2510 Atherosclerotic heart disease of native coronary artery without angina pectoris: Secondary | ICD-10-CM

## 2022-08-14 DIAGNOSIS — I493 Ventricular premature depolarization: Secondary | ICD-10-CM | POA: Diagnosis not present

## 2022-08-14 DIAGNOSIS — Z6841 Body Mass Index (BMI) 40.0 and over, adult: Secondary | ICD-10-CM | POA: Diagnosis not present

## 2022-08-14 DIAGNOSIS — Z955 Presence of coronary angioplasty implant and graft: Secondary | ICD-10-CM | POA: Diagnosis not present

## 2022-08-14 DIAGNOSIS — Z7982 Long term (current) use of aspirin: Secondary | ICD-10-CM | POA: Insufficient documentation

## 2022-08-14 DIAGNOSIS — I110 Hypertensive heart disease with heart failure: Secondary | ICD-10-CM | POA: Diagnosis not present

## 2022-08-14 DIAGNOSIS — G4733 Obstructive sleep apnea (adult) (pediatric): Secondary | ICD-10-CM | POA: Diagnosis not present

## 2022-08-14 DIAGNOSIS — Z9861 Coronary angioplasty status: Secondary | ICD-10-CM

## 2022-08-14 DIAGNOSIS — E119 Type 2 diabetes mellitus without complications: Secondary | ICD-10-CM | POA: Insufficient documentation

## 2022-08-14 DIAGNOSIS — I5022 Chronic systolic (congestive) heart failure: Secondary | ICD-10-CM | POA: Insufficient documentation

## 2022-08-14 DIAGNOSIS — E785 Hyperlipidemia, unspecified: Secondary | ICD-10-CM | POA: Diagnosis not present

## 2022-08-14 DIAGNOSIS — Z79899 Other long term (current) drug therapy: Secondary | ICD-10-CM | POA: Insufficient documentation

## 2022-08-14 DIAGNOSIS — I25118 Atherosclerotic heart disease of native coronary artery with other forms of angina pectoris: Secondary | ICD-10-CM | POA: Diagnosis not present

## 2022-08-14 DIAGNOSIS — E118 Type 2 diabetes mellitus with unspecified complications: Secondary | ICD-10-CM

## 2022-08-14 MED ORDER — FUROSCIX 80 MG/10ML ~~LOC~~ CTKT: 80.0000 mg | CARTRIDGE | SUBCUTANEOUS | 0 refills | Status: DC

## 2022-08-14 MED ORDER — POTASSIUM CHLORIDE CRYS ER 20 MEQ PO TBCR: 40.0000 meq | EXTENDED_RELEASE_TABLET | ORAL | 4 refills | Status: DC

## 2022-08-14 MED ORDER — SPIRONOLACTONE 25 MG PO TABS: 12.5000 mg | ORAL_TABLET | Freq: Every day | ORAL | 3 refills | Status: DC

## 2022-08-14 NOTE — Progress Notes (Signed)
ADVANCED HF CLINIC NOTE  PCP: Cyndi Bender, PA-C Primary Cardiologist: Agustin Cree  HF Cardiologist: Dr. Haroldine Laws  HPI: Joe [name] is a 52 y.o. male with h/o morbid obesity, DM2, HTN, HL, OSA, CAD and systolic HF (EF 0000000) referred by Dr. Agustin Cree for further management of his HF   Has h/o RBBB but never had any other heart problems until 3/19. Saw Dr. Drue Dun in 3/19 due to CP and SOB. Cath EF 25-35% Severe diabetic CAD with LAD disease (m90 and m75),  RCA d50, LCA m50.   Echo 7/20 EF 20-25% moderate RV dysfunction  Cath January 2020 which showed 40% distal LAD, LAD/LCx stents were patent, mid RCA got 25% lesion right PDA had 50% stenosis the circumflex artery were open.  No intervention has been required.    Had cath 1/22 with stable CAD  Myoview 12/19 EF 30% Apical and infero-apical scar  Had St. Jude CRT-D placed 03/19/19 with Dr. Curt Bears.  Admitted 9/22 for CP. HsTn was 4. Underwent cardiac cath on 03/07/2021 which showed patent stents in the LAD and LCX with diffuse distal LAD which may be a trigger for angina as well as mildly elevated RA pressure with normal PCWP and LVEDP and preserved cardiac output.   Echo 9/22 EF 35-40%  Admitted 6/23 with CP. Hstrop 35. Felt to be non-cardiac. Echo EF 30-35%  Seen in ED 04/10/22 with SOB, found to be volume overloaded. BNP only 172. Hst trop 37->47. CXR with pulmonary edema. Given IV lasix with > 2L out. Refused hospital admission, had been in [place] 2-3 x to get fluids back.  Follow up 10/23, NYHA III and volume up, ReDs 43%. Given Furoscix x 3 days, then increased lasix to 80 daily alternating with 40 every other day.   Today he returns for HF follow up. Overall feeling fine. He has SOB walking further distances or up inclines. Finding himself using the motorized scooter more grocery shopping. Having worse orthopnea/PND for past month. Used Furoscix 1 dose 3 weeks ago with good response, diuresed 10 lbs.  Denies  palpitations, CP, dizziness, or edema. Appetite ok. No fever or chills. Weight at home 338-340 pounds. Taking all medications. Furoscix has helped tremendously.  Cardiac Studies - Echo (6/23): EF 30-35%  - Echo (9/22): EF 35-40%  - R/LHC (9/22): patent stents in the LAD and LCX with diffuse distal LAD, which may have been trigger for angina. RA mean 11 PA 36/7 (mean 12) PCWP mean 12 CO/CI (Fick) 6.54/2.33 PVR 1.2 WU  - LHC (1/22): stable CAD  - LHC (1/20): 40% distal LAD, LAD/LCx stents were patent, mid RCA got 25% lesion right PDA had 50% stenosis the circumflex artery were open.  No intervention has been required.    - Echo (7/20): EF 20-25% moderate RV dysfunction  - Cath (3/19): EF 25-35% Severe diabetic CAD with LAD disease (m90 and m75),  RCA d50, LCA m50.   Past Medical History:  Diagnosis Date   Abnormal EKG    Acute systolic heart failure ([place]) 09/06/2017   Angina pectoris ([place]) 09/05/2017   Body mass index 45.0-49.9, adult (HCC)    CAD S/P percutaneous coronary angioplasty 09/07/2017   mLAD and dLAD PCI with DES 09/06/17   CHF (congestive heart failure) (HCC)    Chronic systolic (congestive) heart failure ([place]) XX123456   Complication of anesthesia 1995   "had hard time waking up; breathing w/vasectomy"   Coronary artery disease    Erectile dysfunction    Essential hypertension, benign  Fatigue    GERD (gastroesophageal reflux disease)    Gout    High cholesterol    "just started tx yesterday" (09/06/2017)   History of gout    "RX prn" (09/06/2017)   Ischemic cardiomyopathy 09/07/2017   EF 25-35%   Muscular chest pain    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   Below Left Nare (MOH's)   Obesity    Obstructive sleep apnea    OSA on CPAP    Plantar fasciitis    Pre-operative clearance 02/11/2018   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/02/2019   Saint Jude   Right bundle branch block    Shortness of breath 09/05/2017    Testosterone deficiency    Type 2 diabetes mellitus with complication, without long-term current use of insulin ([place]) 09/05/2017   Type II diabetes mellitus ([place])    "started tx 08/28/2017"   Current Outpatient Medications  Medication Sig Dispense Refill   acetaminophen (TYLENOL) 500 MG tablet Take 1,000 mg by mouth every 6 (six) hours as needed for moderate pain or headache.     allopurinol (ZYLOPRIM) 100 MG tablet Take 100 mg by mouth daily as needed (for gout).      aspirin EC 81 MG tablet Take 1 tablet (81 mg total) by mouth daily. 90 tablet 3   carvedilol (COREG) 12.5 MG tablet Take 1 tablet (12.5 mg total) by mouth 2 (two) times daily. 180 tablet 2   colchicine 0.6 MG tablet Take 0.6 mg by mouth daily as needed (Gout).     dapagliflozin propanediol (FARXIGA) 10 MG TABS tablet Take 1 tablet (10 mg total) by mouth daily. 90 tablet 2   escitalopram (LEXAPRO) 10 MG tablet Take 10 mg by mouth every morning.     ezetimibe (ZETIA) 10 MG tablet TAKE 1 TABLET BY MOUTH EVERY DAY 90 tablet 1   furosemide (LASIX) 40 MG tablet Take 1 tablet (40 mg total) by mouth daily. 90 tablet 1   gabapentin (NEURONTIN) 100 MG capsule Take 100 mg by mouth 2 (two) times daily.     ibuprofen (ADVIL) 200 MG tablet Take 800 mg by mouth every 8 (eight) hours as needed for headache or moderate pain.     indomethacin (INDOCIN) 50 MG capsule Take 50 mg by mouth daily as needed for mild pain or moderate pain (Gout).     isosorbide mononitrate (IMDUR) 30 MG 24 hr tablet Take 1 tablet (30 mg total) by mouth daily. 90 tablet 3   metFORMIN (GLUCOPHAGE-XR) 500 MG 24 hr tablet Take 2 tablets (1,000 mg total) by mouth in the morning and at bedtime.     mexiletine (MEXITIL) 200 MG capsule Take 1 capsule (200 mg total) by mouth 2 (two) times daily. Please make overdue appt with Dr. Curt Bears before anymore refills. Thank you 3rd and Final Attempt 180 capsule 3   nitroGLYCERIN (NITROSTAT) 0.4 MG SL tablet Place 0.4 mg under the tongue  every 5 (five) minutes as needed for chest pain.     omeprazole (PRILOSEC) 40 MG capsule Take 1 capsule (40 mg total) by mouth daily. 90 capsule 1   Polyethylene Glycol 400 (BLINK TEARS OP) Place 2 drops into both eyes 4 (four) times daily as needed (for dry eyes).     potassium chloride SA (KLOR-CON M) 20 MEQ tablet Take 1 tablet (20 mEq total) by mouth daily. 90 tablet 3   ranolazine (RANEXA) 500 MG 12 hr tablet Take 1 tablet (500 mg total) by mouth  2 (two) times daily. 180 tablet 2   rosuvastatin (CRESTOR) 40 MG tablet Take 1 tablet (40 mg total) by mouth daily. 90 tablet 2   sacubitril-valsartan (ENTRESTO) 24-26 MG Take 1 tablet by mouth 2 (two) times daily. 60 tablet 2   Semaglutide,0.25 or 0.'5MG'$ /DOS, (OZEMPIC, 0.25 OR 0.5 MG/DOSE,) 2 MG/1.5ML SOPN Inject 1 mg into the skin once a week.     No current facility-administered medications for this encounter.   No Known Allergies  Social History   Socioeconomic History   Marital status: Married    Spouse name: Not on file   Number of children: Not on file   Years of education: Not on file   Highest education level: Not on file  Occupational History   Not on file  Tobacco Use   Smoking status: Never   Smokeless tobacco: Never  Vaping Use   Vaping Use: Never used  Substance and Sexual Activity   Alcohol use: No   Drug use: Never   Sexual activity: Not on file  Other Topics Concern   Not on file  Social History Narrative   Not on file   Social Determinants of Health   Financial Resource Strain: Not on file  Food Insecurity: Not on file  Transportation Needs: Not on file  Physical Activity: Not on file  Stress: Not on file  Social Connections: Not on file  Intimate Partner Violence: Not on file   Family History  Problem Relation Age of Onset   Lymphoma Father    Diabetes Mellitus II Brother    Diabetes Mellitus II Paternal Grandfather    Lung cancer Mother    Brain cancer Mother    BP 110/70   Pulse 76   Wt (!)  152.4 kg (336 lb)   SpO2 94%   BMI 42.00 kg/m   Wt Readings from Last 3 Encounters:  08/14/22 (!) 152.4 kg (336 lb)  08/10/22 (!) 157.9 kg (348 lb 3.2 oz)  05/10/22 (!) 160.6 kg (354 lb)   PHYSICAL EXAM: General:  NAD. No resp difficulty, walked into clinic HEENT: Normal Neck: Supple. No JVD, thick neck. Carotids 2+ bilat; no bruits. No lymphadenopathy or thryomegaly appreciated. Cor: PMI nondisplaced. Regular rate & rhythm. No rubs, gallops or murmurs. Lungs: Clear Abdomen: Soft, obese, nontender, nondistended. No hepatosplenomegaly. No bruits or masses. Good bowel sounds. Extremities: No cyanosis, clubbing, rash, 1+ BLE edema Neuro: Alert & oriented x 3, cranial nerves grossly intact. Moves all 4 extremities w/o difficulty. Affect pleasant.  ReDs: 36%  Device interrogation: unable to interrogate device in clinic  ASSESSMENT & PLAN: 1. Chronic Systolic HF - due to iCM - Echo 8/20 EF 20-25%  - Echo 9/22 35-40% - Echo 6/23 EF 30-35% - Stable NYHA II-III. Volume status ok, ReDS 36% - Restart spironolactone 12.5 mg daily and stop KCL  - Continue Lasix 40 mg daily. - Continue Entresto 24/26 bid.  - Continue carvedilol 12.5 mg bid. - Continue Farxiga 10 mg daily. No GU symptoms. - Labs from Dr. Raliegh Ip visit on 08/10/22 reviewed, K 4.7, SCr 1.10 - BMET in 1 week.  2. CAD with chronic stable angina - s/p multiple stents to LAD and LCX - cath 9/22 stable CAD - No chest pain. - Continue ASA, Imdur 30 mg daily - Continue Ranexa - Followed Dr. Raliegh Ip.  3. DM2 - Continue Farxiga.  4. OSA - Not using CPAP, says machine was recalled a while ago - Refer back to Dr. Radford Pax to discuss options.  5. Obesity - Body mass index is 42 kg/m. - has struggled with weight loss - Continue Ozempic  6. Frequent PVCs - On mexilitene - Zio 5/22 PVC burden 5.4%.  - Followed by Dr. Curt Bears  Follow up in 4 months with Dr. Haroldine Laws. Will refill Furoscix for home PRN use, he does a good job using  it to keep fluid down. Discussed he will need to take 40 KCL when using Furoscix and hold oral Lasix.  Rafael Bihari, FNP  3:04 PM

## 2022-08-14 NOTE — Telephone Encounter (Signed)
On the patient last OV with Dr. Agustin Cree, he asked to initiate PA for [facility].  Per cover my meds stated the following: This medication or product was previously approved on [id number] from 2022-05-16 to 2023-05-17. **Please note: This request was submitted electronically. Formulary lowering, tiering exception, cost reduction and/or pre-benefit determination review (including prospective Medicare hospice reviews) requests cannot be requested using this method of submission. Providers contact us at [phone] for further assistance.

## 2022-08-14 NOTE — Patient Instructions (Addendum)
Thank you for coming in today  Your physician recommends that you schedule a follow-up appointment in:  4 months with Dr. Haroldine Laws  You will receive a reminder letter in the mail a few months in advance. If you don't receive a letter, please call our office to schedule the follow-up appointment.  RESTART Spironolactone 12.5 mg daily   You have been referred back to Dr. Theodosia Blender office their office will call you for appointment detail  Your physician recommends that you return for lab work in:  1 week BMET  Your Furoscix will be refilled     Do the following things EVERYDAY: Weigh yourself in the morning before breakfast. Write it down and keep it in a log. Take your medicines as prescribed Eat low salt foods--Limit salt (sodium) to 2000 mg per day.  Stay as active as you can everyday Limit all fluids for the day to less than 2 liters  At the [facility], you and your health needs are our priority. As part of our continuing mission to provide you with exceptional heart care, we have created designated Provider Care Teams. These Care Teams include your primary Cardiologist (physician) and Advanced Practice Providers (APPs- Physician Assistants and Nurse Practitioners) who all work together to provide you with the care you need, when you need it.   You may see any of the following providers on your designated Care Team at your next follow up: Dr Glori Bickers Dr Loralie Champagne Dr. Roxana Hires, NP Lyda Jester, [place] [facility] [place], [place] Forestine Na, NP Audry Riles, PharmD   Please be sure to bring in all your medications bottles to every appointment.    Thank you for choosing [facility]   If you have any questions or concerns before your next appointment please send [place] a message through [place] or call our office at [phone].    TO LEAVE A MESSAGE FOR THE NURSE SELECT OPTION 2,  PLEASE LEAVE A MESSAGE INCLUDING: YOUR NAME DATE OF BIRTH CALL BACK NUMBER REASON FOR CALL**this is important as we prioritize the call backs  YOU WILL RECEIVE A CALL BACK THE SAME DAY AS LONG AS YOU CALL BEFORE 4:00 PM

## 2022-08-14 NOTE — Progress Notes (Signed)
ReDS Vest / Clip - 08/14/22 1515       ReDS Vest / Clip   Station Marker D    Ruler Value 47    ReDS Value Range Moderate volume overload    ReDS Actual Value 36    Anatomical Comments sitting

## 2022-08-14 NOTE — Addendum Note (Signed)
Encounter addended by: Payton Mccallum, RN on: 08/14/2022 4:03 PM  Actions taken: Order list changed, Pharmacy for encounter modified

## 2022-08-14 NOTE — Addendum Note (Signed)
Encounter addended by: Payton Mccallum, RN on: 08/14/2022 3:56 PM  Actions taken: Flowsheet accepted, Clinical Note Signed

## 2022-08-31 NOTE — Progress Notes (Signed)
Remote ICD transmission.   

## 2022-09-30 ENCOUNTER — Other Ambulatory Visit: Payer: Self-pay | Admitting: Cardiology

## 2022-10-02 NOTE — Telephone Encounter (Signed)
Rx refill sent to pharmacy. 

## 2022-10-07 ENCOUNTER — Other Ambulatory Visit: Payer: Self-pay | Admitting: Cardiology

## 2022-10-10 ENCOUNTER — Ambulatory Visit: Payer: 59 | Admitting: Podiatry

## 2022-10-10 ENCOUNTER — Encounter: Payer: Self-pay | Admitting: Cardiology

## 2022-10-10 DIAGNOSIS — B351 Tinea unguium: Secondary | ICD-10-CM

## 2022-10-10 DIAGNOSIS — I739 Peripheral vascular disease, unspecified: Secondary | ICD-10-CM

## 2022-10-10 DIAGNOSIS — M79674 Pain in right toe(s): Secondary | ICD-10-CM | POA: Diagnosis not present

## 2022-10-10 DIAGNOSIS — E1142 Type 2 diabetes mellitus with diabetic polyneuropathy: Secondary | ICD-10-CM

## 2022-10-10 DIAGNOSIS — L84 Corns and callosities: Secondary | ICD-10-CM | POA: Diagnosis not present

## 2022-10-10 DIAGNOSIS — M79675 Pain in left toe(s): Secondary | ICD-10-CM | POA: Diagnosis not present

## 2022-10-10 NOTE — Progress Notes (Signed)
  Subjective:  Patient ID: Joe [name], male    DOB: 1971/03/19,  MRN: [id number]  Chief Complaint  Patient presents with   diabetic foot care     52 y.o. male presents with the above complaint. History confirmed with patient. Patient presenting with pain related to dystrophic thickened elongated nails. Patient is unable to trim own nails related to nail dystrophy and/or mobility issues. Patient does have a history of T2DM. Patient does  have callus present located at the plantar aspect left hallux IPJ causing pain.   Objective:  Physical Exam: warm, good capillary refill nail exam onychomycosis of the toenails, onycholysis, and dystrophic nails DP pulses palpable, PT pulses palpable, and protective sensation absent Left Foot:  Pain with palpation of nails due to elongation and dystrophic growth. Attention to left hallux, hyperkeratotic lesion at plantar IPJ with pain. No underlying ulceration Right Foot: Pain with palpation of nails due to elongation and dystrophic growth.   Assessment:   1. Pre-ulcerative calluses   2. Pain due to onychomycosis of toenails of both feet   3. PAD (peripheral artery disease)   4. DM type 2 with diabetic peripheral neuropathy      Plan:  Patient was evaluated and treated and all questions answered.  #Hyperkeratotic lesions/pre ulcerative calluses present subleft hallux plantar IPJ All symptomatic hyperkeratoses x 1 separate lesions were safely debrided with a sterile #10 blade to patient's level of comfort without incident. We discussed preventative and palliative care of these lesions including supportive and accommodative shoegear, padding, prefabricated and custom molded accommodative orthoses, use of a pumice stone and lotions/creams daily.  #Onychomycosis with pain  -Nails palliatively debrided as below. -Educated on self-care  Procedure: Nail Debridement Rationale: Pain Type of Debridement: manual, sharp  debridement. Instrumentation: Nail nipper, rotary burr. Number of Nails: 10  Return in about 3 months (around 01/09/2023) for [facility].         Corinna Gab, DPM [facility]

## 2022-10-12 ENCOUNTER — Telehealth: Payer: Self-pay

## 2022-10-12 DIAGNOSIS — I5022 Chronic systolic (congestive) heart failure: Secondary | ICD-10-CM

## 2022-10-12 MED ORDER — FUROSEMIDE 40 MG PO TABS: 60.0000 mg | ORAL_TABLET | Freq: Every day | ORAL | 3 refills | Status: DC

## 2022-10-12 NOTE — Telephone Encounter (Signed)
Per Dr. Bing Matter, increase Lasix to  daily, BMP in 1 week. Encouraged pt to call with any questions.

## 2022-10-16 ENCOUNTER — Ambulatory Visit (INDEPENDENT_AMBULATORY_CARE_PROVIDER_SITE_OTHER): Payer: 59

## 2022-10-16 DIAGNOSIS — I5022 Chronic systolic (congestive) heart failure: Secondary | ICD-10-CM

## 2022-10-16 DIAGNOSIS — I255 Ischemic cardiomyopathy: Secondary | ICD-10-CM

## 2022-10-17 LAB — CUP PACEART REMOTE DEVICE CHECK
Battery Remaining Longevity: 40 mo
Battery Remaining Percentage: 49 %
Battery Voltage: 2.95 V
Brady Statistic AP VP Percent: 2.5 %
Brady Statistic AP VS Percent: 1 %
Brady Statistic AS VP Percent: 91 %
Brady Statistic AS VS Percent: 4.1 %
Brady Statistic RA Percent Paced: 1.1 %
Date Time Interrogation Session: 20240430131759
HighPow Impedance: 73 Ohm
HighPow Impedance: 73 Ohm
Implantable Lead Connection Status: 753985
Implantable Lead Connection Status: 753985
Implantable Lead Connection Status: 753985
Implantable Lead Implant Date: 20200930
Implantable Lead Implant Date: 20200930
Implantable Lead Implant Date: 20200930
Implantable Lead Location: 753858
Implantable Lead Location: 753859
Implantable Lead Location: 753860
Implantable Pulse Generator Implant Date: 20200930
Lead Channel Impedance Value: 350 Ohm
Lead Channel Impedance Value: 440 Ohm
Lead Channel Impedance Value: 950 Ohm
Lead Channel Pacing Threshold Amplitude: 0.5 V
Lead Channel Pacing Threshold Amplitude: 0.75 V
Lead Channel Pacing Threshold Amplitude: 1 V
Lead Channel Pacing Threshold Pulse Width: 0.5 ms
Lead Channel Pacing Threshold Pulse Width: 0.5 ms
Lead Channel Pacing Threshold Pulse Width: 0.5 ms
Lead Channel Sensing Intrinsic Amplitude: 10.7 mV
Lead Channel Sensing Intrinsic Amplitude: 2 mV
Lead Channel Setting Pacing Amplitude: 2 V
Lead Channel Setting Pacing Amplitude: 2.5 V
Lead Channel Setting Pacing Amplitude: 2.5 V
Lead Channel Setting Pacing Pulse Width: 0.5 ms
Lead Channel Setting Pacing Pulse Width: 0.5 ms
Lead Channel Setting Sensing Sensitivity: 0.5 mV
Pulse Gen Serial Number: 9901124
Zone Setting Status: 755011

## 2022-10-23 ENCOUNTER — Encounter: Payer: Self-pay | Admitting: Physician Assistant

## 2022-10-31 ENCOUNTER — Telehealth: Payer: Self-pay

## 2022-10-31 NOTE — Telephone Encounter (Addendum)
[employer] sent a medical records request for any notes, scan or treatment from 05/11/2022 to present. I sent this to HIM Pool. Form processed to scan.

## 2022-11-07 ENCOUNTER — Other Ambulatory Visit (HOSPITAL_COMMUNITY): Payer: Self-pay

## 2022-11-07 NOTE — Telephone Encounter (Signed)
Called and left patient a voice message  to confirm/remind patient of their appointment at the [facility] on 11/08/22.

## 2022-11-07 NOTE — Progress Notes (Signed)
ADVANCED HF CLINIC NOTE  PCP: Lonie Peak, PA-C Primary Cardiologist: Bing Matter  HF Cardiologist: Dr. Gala Romney  HPI: Joe [name] is a 52 y.o. male with h/o morbid obesity, DM2, HTN, HL, OSA, CAD and systolic HF (EF 20-25%) referred by Dr. Bing Matter for further management of his HF   Has h/o RBBB but never had any other heart problems until 3/19. Saw Dr. Mauri Brooklyn in 3/19 due to CP and SOB. Cath EF 25-35% Severe diabetic CAD with LAD disease (m90 and m75),  RCA d50, LCA m50.   Echo 7/20 EF 20-25% moderate RV dysfunction  Cath January 2020 which showed 40% distal LAD, LAD/LCx stents were patent, mid RCA got 25% lesion right PDA had 50% stenosis the circumflex artery were open.  No intervention has been required.    Had cath 1/22 with stable CAD  Myoview 12/19 EF 30% Apical and infero-apical scar  Had St. Jude CRT-D placed 03/19/19 with Dr. Elberta Fortis.  Admitted 9/22 for CP. HsTn was 4. Underwent cardiac cath on 03/07/2021 which showed patent stents in the LAD and LCX with diffuse distal LAD which may be a trigger for angina as well as mildly elevated RA pressure with normal PCWP and LVEDP and preserved cardiac output.   Echo 9/22 EF 35-40%  Admitted 6/23 with CP. Hstrop 35. Felt to be non-cardiac. Echo EF 30-35%  Seen in ED 04/10/22 with SOB, found to be volume overloaded. BNP only 172. Hst trop 37->47. CXR with pulmonary edema. Given IV lasix with > 2L out. Refused hospital admission, had been in [facility] x to get fluids back.  Follow up 10/23, NYHA III and volume up, ReDs 43%. Given Furoscix x 3 days, then increased lasix to 80 daily alternating with 40 mg qod. Follow up 2/24, stable  NYHA II-III and volume OK. ReDs 36%, spiro 12.5 mg daily restarted.   Today he returns for HF follow up with his wife. Overall feeling "rough" x 2 weeks. On Lasix 80 daily, urinating briskly. He has SOB walking up inclines, or carrying items. Feels occasional heart racing and chest tightness.  Has LE swelling and lightheadedness. Denies abnormal bleeding, GU symptoms, dizziness,or PND/Orthopnea. Appetite ok. No fever or chills. Weight at home 345 pounds. Taking all medications. Used Furoscix 1 month ago, felt like it dehydrated him.  Cardiac Studies - Echo (6/23): EF 30-35%  - Echo (9/22): EF 35-40%  - R/LHC (9/22): patent stents in the LAD and LCX with diffuse distal LAD, which may have been trigger for angina. RA mean 11 PA 36/7 (mean 12) PCWP mean 12 CO/CI (Fick) 6.54/2.33 PVR 1.2 WU  - LHC (1/22): stable CAD  - LHC (1/20): 40% distal LAD, LAD/LCx stents were patent, mid RCA got 25% lesion right PDA had 50% stenosis the circumflex artery were open.  No intervention has been required.    - Echo (7/20): EF 20-25% moderate RV dysfunction  - Cath (3/19): EF 25-35% Severe diabetic CAD with LAD disease (m90 and m75),  RCA d50, LCA m50.   Past Medical History:  Diagnosis Date   Abnormal EKG    Acute systolic heart failure (HCC) 09/06/2017   Angina pectoris (HCC) 09/05/2017   Body mass index 45.0-49.9, adult (HCC)    CAD S/P percutaneous coronary angioplasty 09/07/2017   mLAD and dLAD PCI with DES 09/06/17   CHF (congestive heart failure) (HCC)    Chronic systolic (congestive) heart failure (HCC) 03/19/2019   Complication of anesthesia 1995   "had hard time waking up; breathing  w/vasectomy"   Coronary artery disease    Erectile dysfunction    Essential hypertension, benign    Fatigue    GERD (gastroesophageal reflux disease)    Gout    High cholesterol    "just started tx yesterday" (09/06/2017)   History of gout    "RX prn" (09/06/2017)   Ischemic cardiomyopathy 09/07/2017   EF 25-35%   Muscular chest pain    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   Below Left Nare (MOH's)   Obesity    Obstructive sleep apnea    OSA on CPAP    Plantar fasciitis    Pre-operative clearance 02/11/2018   Presence of cardiac resynchronization therapy defibrillator (CRT-D)  04/02/2019   Saint Jude   Right bundle branch block    Shortness of breath 09/05/2017   Testosterone deficiency    Type 2 diabetes mellitus with complication, without long-term current use of insulin (HCC) 09/05/2017   Type II diabetes mellitus (HCC)    "started tx 08/28/2017"   Current Outpatient Medications  Medication Sig Dispense Refill   acetaminophen (TYLENOL) 500 MG tablet Take 1,000 mg by mouth every 6 (six) hours as needed for moderate pain or headache.     allopurinol (ZYLOPRIM) 100 MG tablet Take 100 mg by mouth daily as needed (for gout).      aspirin EC 81 MG tablet Take 1 tablet (81 mg total) by mouth daily. 90 tablet 3   carvedilol (COREG) 12.5 MG tablet Take 1 tablet (12.5 mg total) by mouth 2 (two) times daily. 180 tablet 2   colchicine 0.6 MG tablet Take 0.6 mg by mouth daily as needed (Gout).     dapagliflozin propanediol (FARXIGA) 10 MG TABS tablet Take 1 tablet (10 mg total) by mouth daily. 90 tablet 2   escitalopram (LEXAPRO) 10 MG tablet Take 10 mg by mouth every morning.     ezetimibe (ZETIA) 10 MG tablet TAKE 1 TABLET BY MOUTH EVERY DAY 90 tablet 1   furosemide (LASIX) 40 MG tablet Take 1.5 tablets (60 mg total) by mouth daily. (Patient taking differently: Take 60 mg by mouth daily. Patient takes 2 tablets by mouth daily.) 145 tablet 3   gabapentin (NEURONTIN) 100 MG capsule Take 100 mg by mouth 2 (two) times daily.     ibuprofen (ADVIL) 200 MG tablet Take 800 mg by mouth every 8 (eight) hours as needed for headache or moderate pain.     indomethacin (INDOCIN) 50 MG capsule Take 50 mg by mouth daily as needed for mild pain or moderate pain (Gout).     isosorbide mononitrate (IMDUR) 30 MG 24 hr tablet TAKE 1 TABLET BY MOUTH EVERY DAY 90 tablet 3   metFORMIN (GLUCOPHAGE-XR) 500 MG 24 hr tablet Take 2 tablets (1,000 mg total) by mouth in the morning and at bedtime.     mexiletine (MEXITIL) 200 MG capsule Take 1 capsule (200 mg total) by mouth 2 (two) times daily. 180  capsule 2   nitroGLYCERIN (NITROSTAT) 0.4 MG SL tablet Place 0.4 mg under the tongue every 5 (five) minutes as needed for chest pain.     omeprazole (PRILOSEC) 40 MG capsule Take 1 capsule (40 mg total) by mouth daily. 90 capsule 1   Polyethylene Glycol 400 (BLINK TEARS OP) Place 2 drops into both eyes 4 (four) times daily as needed (for dry eyes).     potassium chloride SA (KLOR-CON M) 20 MEQ tablet Take 2 tablets (40 mEq total) by mouth as directed. When  you take furoscix please take 40 meq of potassium (Patient taking differently: Take 40 mEq by mouth as directed. Patient takes 1 tablet by mouth daily.) 30 tablet 4   ranolazine (RANEXA) 500 MG 12 hr tablet Take 1 tablet (500 mg total) by mouth 2 (two) times daily. 180 tablet 2   rosuvastatin (CRESTOR) 40 MG tablet Take 1 tablet (40 mg total) by mouth daily. 90 tablet 2   sacubitril-valsartan (ENTRESTO) 24-26 MG Take 1 tablet by mouth 2 (two) times daily. 60 tablet 2   Semaglutide,0.25 or 0.5MG /DOS, (OZEMPIC, 0.25 OR 0.5 MG/DOSE,) 2 MG/1.5ML SOPN Inject 1 mg into the skin once a week.     spironolactone (ALDACTONE) 25 MG tablet Take 0.5 tablets (12.5 mg total) by mouth daily. 45 tablet 3   Furosemide (FUROSCIX) 80 MG/10ML CTKT Inject 80 mg into the skin as directed. (Patient not taking: Reported on 11/08/2022) 4 each 0   No current facility-administered medications for this encounter.   No Known Allergies  Social History   Socioeconomic History   Marital status: Married    Spouse name: Not on file   Number of children: Not on file   Years of education: Not on file   Highest education level: Not on file  Occupational History   Not on file  Tobacco Use   Smoking status: Never   Smokeless tobacco: Never  Vaping Use   Vaping Use: Never used  Substance and Sexual Activity   Alcohol use: No   Drug use: Never   Sexual activity: Not on file  Other Topics Concern   Not on file  Social History Narrative   Not on file   Social  Determinants of Health   Financial Resource Strain: Not on file  Food Insecurity: Not on file  Transportation Needs: Not on file  Physical Activity: Not on file  Stress: Not on file  Social Connections: Not on file  Intimate Partner Violence: Not on file   Family History  Problem Relation Age of Onset   Lymphoma Father    Diabetes Mellitus II Brother    Diabetes Mellitus II Paternal Grandfather    Lung cancer Mother    Brain cancer Mother    BP 126/74   Pulse 94   Wt (!) 157.9 kg (348 lb 3.2 oz)   SpO2 95%   BMI 43.52 kg/m   Wt Readings from Last 3 Encounters:  11/08/22 (!) 157.9 kg (348 lb 3.2 oz)  08/14/22 (!) 152.4 kg (336 lb)  08/10/22 (!) 157.9 kg (348 lb 3.2 oz)   PHYSICAL EXAM: General:  Mild conversational dyspnea, walked into clinic HEENT: Normal Neck: Supple. JVP to ear. Carotids 2+ bilat; no bruits. No lymphadenopathy or thryomegaly appreciated. Cor: PMI nondisplaced. Regular rate & rhythm. No rubs, gallops or murmurs. Lungs: Crackles bases Abdomen: Soft, obese, nontender, nondistended. No hepatosplenomegaly. No bruits or masses. Good bowel sounds. Extremities: No cyanosis, clubbing, rash, 2+ BLE edema Neuro: Alert & oriented x 3, cranial nerves grossly intact. Moves all 4 extremities w/o difficulty. Affect pleasant.  ReDs: unable to get reading today  Device interrogation (personally reviewed): CorVue at reference line, 94% Biv pacing, no VT  ASSESSMENT & PLAN: 1. Acute on Chronic Systolic HF - due to iCM - Echo 8/20 EF 20-25%  - Echo 9/22 35-40% - Echo 6/23 EF 30-35% - NYHA IIIb. Volume up, weight up ~ 12 lbs. - Stop Lasix. Use Furoscix 80 mg/40 KCL x 2 days. - After 2 days of Furoscix, start  torsemide 40 mg daily + 20 KCL daily. - Continue spironolactone 12.5 mg daily. - Continue Entresto 24/26 mg bid.  - Continue carvedilol 12.5 mg bid. - Continue Farxiga 10 mg daily. No GU symptoms. - Labs today. Will need BMET and Mag next week at follow  up. - update echo after diuresis.  2. CAD with chronic stable angina - s/p multiple stents to LAD and LCX - cath 9/22 stable CAD - No chest pain. - Continue ASA, beta blocker and Imdur 30 mg daily. - Continue Ranexa 500 mg bid. - Followed Dr. Kirtland Bouchard.  3. DM2 - A1c 7.2 (1/24) - Continue Farxiga.  4. OSA - Not using CPAP, says machine was recalled a while ago - Refer back to Dr. Mayford Knife to discuss options.  5. Obesity - Body mass index is 43.52 kg/m. - has struggled with weight loss - Continue Ozempic  6. Frequent PVCs - On mexilitene 200 mg bid - Zio 5/22 PVC burden 5.4%.  - Followed by Dr. Elberta Fortis  Follow up in 1-2 weeks with APP for fluid check (schedule echo), 2 months with Dr. Gala Romney  Jacklynn Ganong, FNP  10:38 AM   FUROSCIX prescribed  Patient viewed patient education video with QR code for Lenor Coffin code for FUROSCIX placed on AVS  Call FUROSCIX Direct at [phone] for questions regarding on body infuser.  Day 1  FUROSCIX 80 mg once daily  via on body infuser + KDUR 40  Day 2  FUROSCIX 80 mg once daily  via on body infuser+ KDUR 40

## 2022-11-08 ENCOUNTER — Encounter (HOSPITAL_COMMUNITY): Payer: Self-pay

## 2022-11-08 ENCOUNTER — Ambulatory Visit (HOSPITAL_COMMUNITY)
Admission: RE | Admit: 2022-11-08 | Discharge: 2022-11-08 | Disposition: A | Discharge status: Home / Self Care | Payer: 59 | Source: Ambulatory Visit | Attending: Family Medicine | Admitting: Family Medicine

## 2022-11-08 VITALS — BP 126/74 | HR 94 | Wt 348.2 lb

## 2022-11-08 DIAGNOSIS — Z955 Presence of coronary angioplasty implant and graft: Secondary | ICD-10-CM | POA: Diagnosis not present

## 2022-11-08 DIAGNOSIS — E7800 Pure hypercholesterolemia, unspecified: Secondary | ICD-10-CM | POA: Insufficient documentation

## 2022-11-08 DIAGNOSIS — Z7984 Long term (current) use of oral hypoglycemic drugs: Secondary | ICD-10-CM | POA: Insufficient documentation

## 2022-11-08 DIAGNOSIS — G4733 Obstructive sleep apnea (adult) (pediatric): Secondary | ICD-10-CM | POA: Diagnosis not present

## 2022-11-08 DIAGNOSIS — Z4502 Encounter for adjustment and management of automatic implantable cardiac defibrillator: Secondary | ICD-10-CM | POA: Insufficient documentation

## 2022-11-08 DIAGNOSIS — Z7982 Long term (current) use of aspirin: Secondary | ICD-10-CM | POA: Insufficient documentation

## 2022-11-08 DIAGNOSIS — I493 Ventricular premature depolarization: Secondary | ICD-10-CM | POA: Diagnosis not present

## 2022-11-08 DIAGNOSIS — I2510 Atherosclerotic heart disease of native coronary artery without angina pectoris: Secondary | ICD-10-CM | POA: Diagnosis not present

## 2022-11-08 DIAGNOSIS — E119 Type 2 diabetes mellitus without complications: Secondary | ICD-10-CM | POA: Diagnosis not present

## 2022-11-08 DIAGNOSIS — I5023 Acute on chronic systolic (congestive) heart failure: Secondary | ICD-10-CM | POA: Insufficient documentation

## 2022-11-08 DIAGNOSIS — E118 Type 2 diabetes mellitus with unspecified complications: Secondary | ICD-10-CM | POA: Diagnosis not present

## 2022-11-08 DIAGNOSIS — I110 Hypertensive heart disease with heart failure: Secondary | ICD-10-CM | POA: Diagnosis not present

## 2022-11-08 DIAGNOSIS — Z6841 Body Mass Index (BMI) 40.0 and over, adult: Secondary | ICD-10-CM | POA: Insufficient documentation

## 2022-11-08 DIAGNOSIS — I25118 Atherosclerotic heart disease of native coronary artery with other forms of angina pectoris: Secondary | ICD-10-CM | POA: Insufficient documentation

## 2022-11-08 DIAGNOSIS — Z79899 Other long term (current) drug therapy: Secondary | ICD-10-CM | POA: Insufficient documentation

## 2022-11-08 LAB — BASIC METABOLIC PANEL
Anion gap: 9 (ref 5–15)
BUN: 8 mg/dL (ref 6–20)
CO2: 27 mmol/L (ref 22–32)
Calcium: 8.5 mg/dL — ABNORMAL LOW (ref 8.9–10.3)
Chloride: 101 mmol/L (ref 98–111)
Creatinine, Ser: 1.07 mg/dL (ref 0.61–1.24)
GFR, Estimated: >60 mL/min (ref >60)
Glucose, Bld: 139 mg/dL — ABNORMAL HIGH (ref 70–99)
Potassium: 3.9 mmol/L (ref 3.5–5.1)
Sodium: 137 mmol/L (ref 135–145)

## 2022-11-08 LAB — BRAIN NATRIURETIC PEPTIDE: B Natriuretic Peptide: 193.7 pg/mL — ABNORMAL HIGH (ref 0.0–100.0)

## 2022-11-08 MED ORDER — POTASSIUM CHLORIDE CRYS ER 20 MEQ PO TBCR: 20.0000 meq | EXTENDED_RELEASE_TABLET | Freq: Every day | ORAL | 6 refills | Status: DC

## 2022-11-08 MED ORDER — TORSEMIDE 20 MG PO TABS: 40.0000 mg | ORAL_TABLET | Freq: Every day | ORAL | 6 refills | Status: DC

## 2022-11-08 NOTE — Progress Notes (Signed)
Medication Samples have been provided to the patient.  Drug name: furoscix       Strength: 80mg /23mL        Qty: 1  LOT: [id number]  Exp.Date: 9/90/25  Dosing instructions: one infuser as directed   The patient has been instructed regarding the correct time, dose, and frequency of taking this medication, including desired effects and most common side effects.   Magda Bernheim M 11:09 AM 11/08/2022

## 2022-11-08 NOTE — Patient Instructions (Addendum)
STOP Lasix  Your provider has order Furoscix for you. This is an on-body infuser that gives you a dose of Furosemide.   For questions regarding the device call Furoscix Direct at [phone]  Ensure you write down the time you start your infusion so that if there is a problem you will know how long the infusion lasted  Use Furoscix only AS DIRECTED by our office  Dosing Directions:   Day 1= HOLD Torsemide, use one furoscix infuser with 40 meq of potassium  Day 2=HOLD Torsemide, use one furoscix infuser with 40 meq of potassium  START Torsemide 40 mg daily CONTINUE Potassium at 20 meq daily  Labs today We will only contact you if something comes back abnormal or we need to make some changes. Otherwise no news is good news!  You have been referred to [facility] with Dr Mayford Knife -they will be in contact with an appointment  Please wear your compression hose daily, place them on as soon as you get up in the morning and remove before you go to bed at night.   Your physician recommends that you schedule a follow-up appointment in: 1-2 weeks  in the Advanced Practitioners (PA/NP) Clinic and in 2-3 months with Dr Gala Romney  Do the following things EVERYDAY: Weigh yourself in the morning before breakfast. Write it down and keep it in a log. Take your medicines as prescribed Eat low salt foods--Limit salt (sodium) to 2000 mg per day.  Stay as active as you can everyday Limit all fluids for the day to less than 2 liters  At the [facility], you and your health needs are our priority. As part of our continuing mission to provide you with exceptional heart care, we have created designated Provider Care Teams. These Care Teams include your primary Cardiologist (physician) and Advanced Practice Providers (APPs- Physician Assistants and Nurse Practitioners) who all work together to provide you with the care you need, when you need it.   You may see any of the  following providers on your designated Care Team at your next follow up: Dr Arvilla Meres Dr Marca Ancona Dr. Marcos Eke, NP Robbie Lis, [place] [facility] [place], [place] Brynda Peon, NP Karle Plumber, PharmD   Please be sure to bring in all your medications bottles to every appointment.    Thank you for choosing [facility]

## 2022-11-14 ENCOUNTER — Telehealth (HOSPITAL_COMMUNITY): Payer: Self-pay

## 2022-11-14 ENCOUNTER — Telehealth: Payer: Self-pay | Admitting: Physician Assistant

## 2022-11-14 NOTE — Telephone Encounter (Signed)
I called Furoscix and updated.

## 2022-11-14 NOTE — Telephone Encounter (Signed)
Mr. Picardo called back and stated that was ok with him and to have them send out.

## 2022-11-14 NOTE — Telephone Encounter (Signed)
   The patient called the answering service after-hours today. He reports he was told to call in about his monitoring device, could be urgent. I reached out to Dr. Elberta Fortis to clarify if there was an urgent alert he'd received. On 5/10 he'd received abnormal device interrogation with 2 NSVT and recommended to increase carvedilol to 25mg . Sherri Price RN had reached out to patient but had to LM to call back. I reached patient to clarify that office was trying to schedule him for follow-up. We discussed increasing carvedilol but he reports episodic hx of BP running low at baseline so will route back to MD to review and advise further (rare 80s systolic, baseline low 100s - was 98/78 at last OV with Dr. Bing Matter, 126/74 at last HF OV). He otherwise has no acute concerns. Will also route back to Sherri to let her know patient returned call to get scheduled. The patient verbalized understanding and gratitude.  Laurann Montana, PA-C

## 2022-11-14 NOTE — Telephone Encounter (Signed)
Furoscix called and stated patient does have coverage, but will have an $80 copay. They asked [place] to verify this is ok to send to specialty pharmacy.  I called patient to update him and see if he wants [place] to send in.

## 2022-11-16 NOTE — Progress Notes (Signed)
ADVANCED HF CLINIC NOTE  PCP: Lonie Peak, PA-C Primary Cardiologist: Dr. Bing Matter  HF Cardiologist: Dr. Gala Romney  HPI: Joe [name] is a 52 y.o. male with h/o morbid obesity, DM2, HTN, HL, OSA, CAD and systolic HF (EF 20-25%) referred by Dr. Bing Matter for further management of his HF   Has h/o RBBB but never had any other heart problems until 3/19. Saw Dr. Mauri Brooklyn in 3/19 due to CP and SOB. Cath EF 25-35% Severe diabetic CAD with LAD disease (m90 and m75),  RCA d50, LCA m50.   Echo 7/20 EF 20-25% moderate RV dysfunction  Cath January 2020 which showed 40% distal LAD, LAD/LCx stents were patent, mid RCA got 25% lesion right PDA had 50% stenosis the circumflex artery were open.  No intervention has been required.    Had cath 1/22 with stable CAD  Myoview 12/19 EF 30% Apical and infero-apical scar  Had St. Jude CRT-D placed 03/19/19 with Dr. Elberta Fortis.  Admitted 9/22 for CP. HsTn was 4. Underwent cardiac cath on 03/07/2021 which showed patent stents in the LAD and LCX with diffuse distal LAD which may be a trigger for angina as well as mildly elevated RA pressure with normal PCWP and LVEDP and preserved cardiac output.   Echo 9/22 EF 35-40%  Admitted 6/23 with CP. Hstrop 35. Felt to be non-cardiac. Echo EF 30-35%  Seen in ED 04/10/22 with SOB, found to be volume overloaded. BNP only 172. Hst trop 37->47. CXR with pulmonary edema. Given IV lasix with > 2L out. Refused hospital admission, had been in [facility] x to get fluids back.  Follow up 10/23, NYHA III and volume up, ReDs 43%. Given Furoscix x 3 days, then increased lasix to 80 daily alternating with 40 mg qod. Follow up 2/24, stable  NYHA II-III and volume OK. ReDs 36%, spiro 12.5 mg daily restarted.   Follow up 5/24, worse NYHA IIIb and volume up. Lasix stopped, instructed to use Furoscix x 2 days then start torsemide 40 mg daily thereafter.  Today he returns for close HF follow up with his wife. Overall feeling  better. He is not SOB walking on flat ground. Swelling has improved. Urinating more briskly on torsemide. Denies palpitations, CP, dizziness, edema, or PND/Orthopnea. Appetite ok. No fever or chills. Weight at home 335 pounds. Taking all medications. He has 2 Furoscix left at home.   Cardiac Studies - Echo (6/23): EF 30-35%  - Echo (9/22): EF 35-40%  - R/LHC (9/22): patent stents in the LAD and LCX with diffuse distal LAD, which may have been trigger for angina. RA mean 11 PA 36/7 (mean 12) PCWP mean 12 CO/CI (Fick) 6.54/2.33 PVR 1.2 WU  - LHC (1/22): stable CAD  - LHC (1/20): 40% distal LAD, LAD/LCx stents were patent, mid RCA got 25% lesion right PDA had 50% stenosis the circumflex artery were open.  No intervention has been required.    - Echo (7/20): EF 20-25% moderate RV dysfunction  - Cath (3/19): EF 25-35% Severe diabetic CAD with LAD disease (m90 and m75),  RCA d50, LCA m50.   Past Medical History:  Diagnosis Date   Abnormal EKG    Acute systolic heart failure (HCC) 09/06/2017   Angina pectoris (HCC) 09/05/2017   Body mass index 45.0-49.9, adult (HCC)    CAD S/P percutaneous coronary angioplasty 09/07/2017   mLAD and dLAD PCI with DES 09/06/17   CHF (congestive heart failure) (HCC)    Chronic systolic (congestive) heart failure (HCC) 03/19/2019  Complication of anesthesia 1995   "had hard time waking up; breathing w/vasectomy"   Coronary artery disease    Erectile dysfunction    Essential hypertension, benign    Fatigue    GERD (gastroesophageal reflux disease)    Gout    High cholesterol    "just started tx yesterday" (09/06/2017)   History of gout    "RX prn" (09/06/2017)   Ischemic cardiomyopathy 09/07/2017   EF 25-35%   Muscular chest pain    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   Below Left Nare (MOH's)   Obesity    Obstructive sleep apnea    OSA on CPAP    Plantar fasciitis    Pre-operative clearance 02/11/2018   Presence of cardiac  resynchronization therapy defibrillator (CRT-D) 04/02/2019   Saint Jude   Right bundle branch block    Shortness of breath 09/05/2017   Testosterone deficiency    Type 2 diabetes mellitus with complication, without long-term current use of insulin (HCC) 09/05/2017   Type II diabetes mellitus (HCC)    "started tx 08/28/2017"   Current Outpatient Medications  Medication Sig Dispense Refill   acetaminophen (TYLENOL) 500 MG tablet Take 1,000 mg by mouth every 6 (six) hours as needed for moderate pain or headache.     allopurinol (ZYLOPRIM) 100 MG tablet Take 100 mg by mouth daily as needed (for gout).      aspirin EC 81 MG tablet Take 1 tablet (81 mg total) by mouth daily. 90 tablet 3   carvedilol (COREG) 12.5 MG tablet Take 1 tablet (12.5 mg total) by mouth 2 (two) times daily. 180 tablet 2   colchicine 0.6 MG tablet Take 0.6 mg by mouth daily as needed (Gout).     dapagliflozin propanediol (FARXIGA) 10 MG TABS tablet Take 1 tablet (10 mg total) by mouth daily. 90 tablet 2   escitalopram (LEXAPRO) 10 MG tablet Take 10 mg by mouth every morning.     ezetimibe (ZETIA) 10 MG tablet TAKE 1 TABLET BY MOUTH EVERY DAY 90 tablet 1   gabapentin (NEURONTIN) 100 MG capsule Take 100 mg by mouth 2 (two) times daily.     ibuprofen (ADVIL) 200 MG tablet Take 800 mg by mouth every 8 (eight) hours as needed for headache or moderate pain.     indomethacin (INDOCIN) 50 MG capsule Take 50 mg by mouth daily as needed for mild pain or moderate pain (Gout).     isosorbide mononitrate (IMDUR) 30 MG 24 hr tablet TAKE 1 TABLET BY MOUTH EVERY DAY 90 tablet 3   metFORMIN (GLUCOPHAGE-XR) 500 MG 24 hr tablet Take 2 tablets (1,000 mg total) by mouth in the morning and at bedtime.     mexiletine (MEXITIL) 200 MG capsule Take 1 capsule (200 mg total) by mouth 2 (two) times daily. 180 capsule 2   nitroGLYCERIN (NITROSTAT) 0.4 MG SL tablet Place 0.4 mg under the tongue every 5 (five) minutes as needed for chest pain.      omeprazole (PRILOSEC) 40 MG capsule Take 1 capsule (40 mg total) by mouth daily. 90 capsule 1   Polyethylene Glycol 400 (BLINK TEARS OP) Place 2 drops into both eyes 4 (four) times daily as needed (for dry eyes).     potassium chloride SA (KLOR-CON M) 20 MEQ tablet Take 1 tablet (20 mEq total) by mouth daily. When you take furoscix please take 40 meq of potassium 36 tablet 6   ranolazine (RANEXA) 500 MG 12 hr tablet Take 1 tablet (  500 mg total) by mouth 2 (two) times daily. 180 tablet 2   rosuvastatin (CRESTOR) 40 MG tablet Take 1 tablet (40 mg total) by mouth daily. 90 tablet 2   sacubitril-valsartan (ENTRESTO) 24-26 MG Take 1 tablet by mouth 2 (two) times daily. 60 tablet 2   Semaglutide,0.25 or 0.5MG /DOS, (OZEMPIC, 0.25 OR 0.5 MG/DOSE,) 2 MG/1.5ML SOPN Inject 1 mg into the skin once a week.     spironolactone (ALDACTONE) 25 MG tablet Take 0.5 tablets (12.5 mg total) by mouth daily. 45 tablet 3   torsemide (DEMADEX) 20 MG tablet Take 2 tablets (40 mg total) by mouth daily. 60 tablet 6   Furosemide (FUROSCIX) 80 MG/10ML CTKT Inject 80 mg into the skin as directed. (Patient not taking: Reported on 11/08/2022) 4 each 0   No current facility-administered medications for this encounter.   No Known Allergies  Social History   Socioeconomic History   Marital status: Married    Spouse name: Not on file   Number of children: Not on file   Years of education: Not on file   Highest education level: Not on file  Occupational History   Not on file  Tobacco Use   Smoking status: Never   Smokeless tobacco: Never  Vaping Use   Vaping Use: Never used  Substance and Sexual Activity   Alcohol use: No   Drug use: Never   Sexual activity: Not on file  Other Topics Concern   Not on file  Social History Narrative   Not on file   Social Determinants of Health   Financial Resource Strain: Not on file  Food Insecurity: Not on file  Transportation Needs: Not on file  Physical Activity: Not on file   Stress: Not on file  Social Connections: Not on file  Intimate Partner Violence: Not on file   Family History  Problem Relation Age of Onset   Lymphoma Father    Diabetes Mellitus II Brother    Diabetes Mellitus II Paternal Grandfather    Lung cancer Mother    Brain cancer Mother    BP 128/84   Pulse 94   Wt (!) 153.2 kg (337 lb 12.8 oz)   SpO2 95%   BMI 42.22 kg/m   Wt Readings from Last 3 Encounters:  11/20/22 (!) 153.2 kg (337 lb 12.8 oz)  11/08/22 (!) 157.9 kg (348 lb 3.2 oz)  08/14/22 (!) 152.4 kg (336 lb)   PHYSICAL EXAM: General:  NAD. No resp difficulty, walked into clinic HEENT: Normal Neck: Supple. No JVD, thick neck. Carotids 2+ bilat; no bruits. No lymphadenopathy or thryomegaly appreciated. Cor: PMI nondisplaced. Regular rate & rhythm. No rubs, gallops or murmurs. Lungs: Clear Abdomen: Soft, obese, nontender, nondistended. No hepatosplenomegaly. No bruits or masses. Good bowel sounds. Extremities: No cyanosis, clubbing, rash, 1+ pre-tibial BLE edema w/ venous stasis changes to BLE Neuro: Alert & oriented x 3, cranial nerves grossly intact. Moves all 4 extremities w/o difficulty. Affect pleasant.  Device interrogation (personally reviewed): CorVue stable, BiV pacing 94%, no VT or AF  ASSESSMENT & PLAN: 1. Chronic Systolic HF - due to iCM - Echo 8/20 EF 20-25%  - Echo 9/22 35-40% - Echo 6/23 EF 30-35% - Improved NYHA II. Volume looks good today by exam and CorVue, weight down 11 lbs. - Continue torsemide 40 mg daily + 20 KCL daily. - Increase spiro to 25 mg daily. - Continue Entresto 24/26 mg bid.  - Continue carvedilol 12.5 mg bid. - Continue Farxiga 10 mg  daily. No GU symptoms. - Update echo next visit. - BMET and Mag today. Repeat BMET in 1 week.  2. CAD with chronic stable angina - s/p multiple stents to LAD and LCX - cath 9/22 stable CAD - No chest pain. - Continue ASA, beta blocker and Imdur 30 mg daily. - Continue Ranexa 500 mg bid. -  Followed Dr. Kirtland Bouchard.  3. DM2 - A1c 7.2 (1/24) - Continue Farxiga.  4. OSA - Not using CPAP, says machine was recalled a while ago - Follows with Dr. Mayford Knife  5. Obesity - Body mass index is 42.22 kg/m. - has struggled with weight loss - Continue Ozempic  6. Frequent PVCs - On mexilitene 200 mg bid - Zio 5/22 PVC burden 5.4%.  - Followed by Dr. Elberta Fortis - BiV pacing down to 94%, he has follow up next month with Dr Elberta Fortis  Follow up 2 months with Dr. Gala Romney + echo.  Jacklynn Ganong, FNP  10:46 AM

## 2022-11-16 NOTE — Telephone Encounter (Signed)
Pt informed/explained previous call/message. Aware that he is overdue for follow up and medication changes may be needed based on last device transmission, but he will need an OV before making any changes. Pt aware office will call to arrange   EP scheduler - please arrange overdue follow up w/ [facility]/EP APP.  Thanks  (Pt has ICD)

## 2022-11-17 ENCOUNTER — Telehealth (HOSPITAL_COMMUNITY): Payer: Self-pay

## 2022-11-17 NOTE — Telephone Encounter (Signed)
Called to confirm/remind patient of their appointment at the [facility] on 11/20/22.  Patient reminded to bring all medications and/or complete list.  Confirmed patient has transportation. Gave directions, instructed to utilize valet parking.  Confirmed appointment prior to ending call.

## 2022-11-17 NOTE — Progress Notes (Signed)
Remote ICD transmission.   

## 2022-11-20 ENCOUNTER — Encounter: Payer: Self-pay | Admitting: Internal Medicine

## 2022-11-20 ENCOUNTER — Ambulatory Visit (HOSPITAL_COMMUNITY)
Admission: RE | Admit: 2022-11-20 | Discharge: 2022-11-20 | Disposition: A | Discharge status: Home / Self Care | Payer: 59 | Source: Ambulatory Visit | Attending: Family Medicine | Admitting: Family Medicine

## 2022-11-20 ENCOUNTER — Encounter (HOSPITAL_COMMUNITY): Payer: Self-pay

## 2022-11-20 VITALS — BP 128/84 | HR 94 | Wt 337.8 lb

## 2022-11-20 DIAGNOSIS — Z7982 Long term (current) use of aspirin: Secondary | ICD-10-CM | POA: Diagnosis not present

## 2022-11-20 DIAGNOSIS — I5021 Acute systolic (congestive) heart failure: Secondary | ICD-10-CM

## 2022-11-20 DIAGNOSIS — Z955 Presence of coronary angioplasty implant and graft: Secondary | ICD-10-CM | POA: Diagnosis not present

## 2022-11-20 DIAGNOSIS — Z7984 Long term (current) use of oral hypoglycemic drugs: Secondary | ICD-10-CM | POA: Diagnosis not present

## 2022-11-20 DIAGNOSIS — Z95810 Presence of automatic (implantable) cardiac defibrillator: Secondary | ICD-10-CM | POA: Insufficient documentation

## 2022-11-20 DIAGNOSIS — E785 Hyperlipidemia, unspecified: Secondary | ICD-10-CM | POA: Diagnosis not present

## 2022-11-20 DIAGNOSIS — Z79899 Other long term (current) drug therapy: Secondary | ICD-10-CM | POA: Insufficient documentation

## 2022-11-20 DIAGNOSIS — Z7985 Long-term (current) use of injectable non-insulin antidiabetic drugs: Secondary | ICD-10-CM | POA: Insufficient documentation

## 2022-11-20 DIAGNOSIS — G4733 Obstructive sleep apnea (adult) (pediatric): Secondary | ICD-10-CM

## 2022-11-20 DIAGNOSIS — I2510 Atherosclerotic heart disease of native coronary artery without angina pectoris: Secondary | ICD-10-CM

## 2022-11-20 DIAGNOSIS — I493 Ventricular premature depolarization: Secondary | ICD-10-CM | POA: Diagnosis not present

## 2022-11-20 DIAGNOSIS — E118 Type 2 diabetes mellitus with unspecified complications: Secondary | ICD-10-CM | POA: Diagnosis not present

## 2022-11-20 DIAGNOSIS — I25118 Atherosclerotic heart disease of native coronary artery with other forms of angina pectoris: Secondary | ICD-10-CM | POA: Insufficient documentation

## 2022-11-20 DIAGNOSIS — I110 Hypertensive heart disease with heart failure: Secondary | ICD-10-CM | POA: Insufficient documentation

## 2022-11-20 DIAGNOSIS — Z6841 Body Mass Index (BMI) 40.0 and over, adult: Secondary | ICD-10-CM | POA: Diagnosis not present

## 2022-11-20 DIAGNOSIS — E119 Type 2 diabetes mellitus without complications: Secondary | ICD-10-CM | POA: Diagnosis not present

## 2022-11-20 DIAGNOSIS — I5022 Chronic systolic (congestive) heart failure: Secondary | ICD-10-CM

## 2022-11-20 LAB — BASIC METABOLIC PANEL
Anion gap: 9 (ref 5–15)
BUN: 14 mg/dL (ref 6–20)
CO2: 27 mmol/L (ref 22–32)
Calcium: 9.2 mg/dL (ref 8.9–10.3)
Chloride: 103 mmol/L (ref 98–111)
Creatinine, Ser: 1.17 mg/dL (ref 0.61–1.24)
GFR, Estimated: >60 mL/min (ref >60)
Glucose, Bld: 121 mg/dL — ABNORMAL HIGH (ref 70–99)
Potassium: 3.9 mmol/L (ref 3.5–5.1)
Sodium: 139 mmol/L (ref 135–145)

## 2022-11-20 LAB — MAGNESIUM: Magnesium: 2.3 mg/dL (ref 1.7–2.4)

## 2022-11-20 MED ORDER — SPIRONOLACTONE 25 MG PO TABS: 25.0000 mg | ORAL_TABLET | Freq: Every day | ORAL | 3 refills | Status: DC

## 2022-11-20 NOTE — Patient Instructions (Signed)
Thank you for coming in today  If you had labs drawn today, any labs that are abnormal the clinic will call you No news is good news  Medications: INCREASE SPIRONOLACTONE to 25 mg daily  Follow up appointments:  Your physician recommends that you schedule a follow-up appointment in:  2 months With Dr. Gala Romney echocardiogram  Your physician has requested that you have an echocardiogram. Echocardiography is a painless test that uses sound waves to create images of your heart. It provides your doctor with information about the size and shape of your heart and how well your heart's chambers and valves are working. This procedure takes approximately one hour. There are no restrictions for this procedure.      Do the following things EVERYDAY: Weigh yourself in the morning before breakfast. Write it down and keep it in a log. Take your medicines as prescribed Eat low salt foods--Limit salt (sodium) to 2000 mg per day.  Stay as active as you can everyday Limit all fluids for the day to less than 2 liters   At the [facility], you and your health needs are our priority. As part of our continuing mission to provide you with exceptional heart care, we have created designated Provider Care Teams. These Care Teams include your primary Cardiologist (physician) and Advanced Practice Providers (APPs- Physician Assistants and Nurse Practitioners) who all work together to provide you with the care you need, when you need it.   You may see any of the following providers on your designated Care Team at your next follow up: Dr Arvilla Meres Dr Marca Ancona Dr. Marcos Eke, NP Robbie Lis, [place] [facility] [place], [place] Brynda Peon, NP Karle Plumber, PharmD   Please be sure to bring in all your medications bottles to every appointment.    Thank you for choosing [facility]  If you have any questions or  concerns before your next appointment please send [place] a message through [place] or call our office at [phone].    TO LEAVE A MESSAGE FOR THE NURSE SELECT OPTION 2, PLEASE LEAVE A MESSAGE INCLUDING: YOUR NAME DATE OF BIRTH CALL BACK NUMBER REASON FOR CALL**this is important as we prioritize the call backs  YOU WILL RECEIVE A CALL BACK THE SAME DAY AS LONG AS YOU CALL BEFORE 4:00 PM

## 2022-12-18 ENCOUNTER — Ambulatory Visit: Payer: 59 | Attending: Cardiology | Admitting: Cardiology

## 2022-12-18 ENCOUNTER — Encounter: Payer: Self-pay | Admitting: Cardiology

## 2022-12-18 VITALS — BP 116/82 | HR 102 | Ht 75.0 in | Wt 336.4 lb

## 2022-12-18 DIAGNOSIS — I255 Ischemic cardiomyopathy: Secondary | ICD-10-CM

## 2022-12-18 DIAGNOSIS — I493 Ventricular premature depolarization: Secondary | ICD-10-CM

## 2022-12-18 DIAGNOSIS — I5022 Chronic systolic (congestive) heart failure: Secondary | ICD-10-CM | POA: Diagnosis not present

## 2022-12-18 LAB — CUP PACEART INCLINIC DEVICE CHECK
Battery Remaining Longevity: 37 mo
Brady Statistic RA Percent Paced: 1 %
Brady Statistic RV Percent Paced: 94 %
Date Time Interrogation Session: 20240701121218
HighPow Impedance: 73.125
Implantable Lead Connection Status: 753985
Implantable Lead Connection Status: 753985
Implantable Lead Connection Status: 753985
Implantable Lead Implant Date: 20200930
Implantable Lead Implant Date: 20200930
Implantable Lead Implant Date: 20200930
Implantable Lead Location: 753858
Implantable Lead Location: 753859
Implantable Lead Location: 753860
Implantable Pulse Generator Implant Date: 20200930
Lead Channel Impedance Value: 387.5 Ohm
Lead Channel Impedance Value: 462.5 Ohm
Lead Channel Impedance Value: 975 Ohm
Lead Channel Pacing Threshold Amplitude: 0.75 V
Lead Channel Pacing Threshold Amplitude: 0.75 V
Lead Channel Pacing Threshold Amplitude: 0.75 V
Lead Channel Pacing Threshold Amplitude: 0.75 V
Lead Channel Pacing Threshold Amplitude: 1 V
Lead Channel Pacing Threshold Amplitude: 1 V
Lead Channel Pacing Threshold Pulse Width: 0.5 ms
Lead Channel Pacing Threshold Pulse Width: 0.5 ms
Lead Channel Pacing Threshold Pulse Width: 0.5 ms
Lead Channel Pacing Threshold Pulse Width: 0.5 ms
Lead Channel Pacing Threshold Pulse Width: 0.5 ms
Lead Channel Pacing Threshold Pulse Width: 0.5 ms
Lead Channel Sensing Intrinsic Amplitude: 11.6 mV
Lead Channel Sensing Intrinsic Amplitude: 2.3 mV
Lead Channel Setting Pacing Amplitude: 2 V
Lead Channel Setting Pacing Amplitude: 2.5 V
Lead Channel Setting Pacing Amplitude: 2.5 V
Lead Channel Setting Pacing Pulse Width: 0.5 ms
Lead Channel Setting Pacing Pulse Width: 0.5 ms
Lead Channel Setting Sensing Sensitivity: 0.5 mV
Pulse Gen Serial Number: 9901124
Zone Setting Status: 755011

## 2022-12-18 MED ORDER — MEXILETINE HCL 250 MG PO CAPS: 250.0000 mg | ORAL_CAPSULE | Freq: Two times a day (BID) | ORAL | 11 refills | Status: DC

## 2022-12-18 NOTE — Patient Instructions (Addendum)
Medication Instructions:  Your physician has recommended you make the following change in your medication:  INCREASE Mexiletine to 250 mg twice daily  *If you need a refill on your cardiac medications before your next appointment, please call your pharmacy*   Lab Work: None ordered   Testing/Procedures: None ordered   Follow-Up: At [facility], you and your health needs are our priority.  As part of our continuing mission to provide you with exceptional heart care, we have created designated Provider Care Teams.  These Care Teams include your primary Cardiologist (physician) and Advanced Practice Providers (APPs -  Physician Assistants and Nurse Practitioners) who all work together to provide you with the care you need, when you need it.   Remote monitoring is used to monitor your Pacemaker or ICD from home. This monitoring reduces the number of office visits required to check your device to one time per year. It allows [place] to keep an eye on the functioning of your device to ensure it is working properly. You are scheduled for a device check from home on 01/15/2023. You may send your transmission at any time that day. If you have a wireless device, the transmission will be sent automatically. After your physician reviews your transmission, you will receive a postcard with your next transmission date.  Your next appointment:   1 year(s)  The format for your next appointment:   In Person  Provider:   Loman Brooklyn, MD    Thank you for choosing [facility]!!   Dory Horn, RN [phone]

## 2022-12-18 NOTE — Progress Notes (Signed)
  Electrophysiology Office Note:   Date:  12/18/2022  ID:  Gay Filler, DOB 06-Nov-1970, MRN [id number]  Primary Cardiologist: [name], MD Electrophysiologist: Regan Lemming, MD      History of Present Illness:   Joe [name] is a 52 y.o. male with h/o chronic systolic heart failure seen today for routine electrophysiology followup.  Since last being seen in our clinic the patient reports doing well.  He has intermittent episodes of feeling short of breath and fatigue.  This happens a few times a month.  He states that his blood pressure is low when this occurs.  Aside from that, he is able to do all of his daily activities.  He does not feel restricted when he is feeling well.  His heart rate is fast today and has been fast at other times, but he is unaware of palpitations.  he denies chest pain, palpitations, dyspnea, PND, orthopnea, nausea, vomiting, dizziness, syncope, edema, weight gain, or early satiety.      He has a history of morbid obesity, type 2 diabetes, hypertension, hyperlipidemia, OSA, coronary artery disease, chronic systolic heart failure.  He is status post Abbott CRT-D.      Review of systems complete and found to be negative unless listed in HPI.      EP information / Studies Reviewed:    EKG is ordered today. Personal review as below.   Atrial sensed, biventricular paced, PVC, rate 102  ICD Interrogation-  reviewed in detail today,  See PACEART report.  Device History: Abbott BiV ICD implanted 03/19/2019 for chronic systolic heart failure History of appropriate therapy: No History of AAD therapy: Yes; currently on Mexiletine    Risk Assessment/Calculations:              Physical Exam:   VS:  BP 116/82   Pulse (!) 102   Ht 6\' 3"  (1.905 m)   Wt (!) 336 lb 6.4 oz (152.6 kg)   SpO2 98%   BMI 42.05 kg/m    Wt Readings from Last 3 Encounters:  12/18/22 (!) 336 lb 6.4 oz (152.6 kg)  11/20/22 (!) 337 lb 12.8 oz (153.2 kg)   11/08/22 (!) 348 lb 3.2 oz (157.9 kg)     GEN: Well nourished, well developed in no acute distress NECK: No JVD; No carotid bruits CARDIAC: Regular rate and rhythm, no murmurs, rubs, gallops RESPIRATORY:  Clear to auscultation without rales, wheezing or rhonchi  ABDOMEN: Soft, non-tender, non-distended EXTREMITIES:  No edema; No deformity   ASSESSMENT AND PLAN:    1.  Chronic systolic dysfunction s/p Abbott CRT-D  euvolemic today Stable on an appropriate medical regimen Normal ICD function See Pace Art report No changes today  2.  Coronary artery disease: Since the LAD and circumflex.  Plan per primary cardiology.  3.  PVCs: Currently on mexiletine.  Burden of 5.4%.  He is continue to have PVCs and that is affecting his biventricular pacing, Mikel Hardgrove increase mexiletine to 250 mg twice daily.  4.  Obesity: Lifestyle modification encouraged.  Continue Ozempic  5.  Obstructive sleep apnea: Jaretzi Droz need to reestablish with Dr. Mayford Knife  Disposition:   Follow up with Dr. Elberta Fortis in 12 months   Signed, Anatasia Tino Jorja Loa, MD

## 2023-01-08 ENCOUNTER — Other Ambulatory Visit: Payer: Self-pay | Admitting: Podiatry

## 2023-01-08 ENCOUNTER — Ambulatory Visit: Payer: 59 | Admitting: Podiatry

## 2023-01-08 DIAGNOSIS — M79675 Pain in left toe(s): Secondary | ICD-10-CM | POA: Diagnosis not present

## 2023-01-08 DIAGNOSIS — M79674 Pain in right toe(s): Secondary | ICD-10-CM

## 2023-01-08 DIAGNOSIS — E1142 Type 2 diabetes mellitus with diabetic polyneuropathy: Secondary | ICD-10-CM

## 2023-01-08 DIAGNOSIS — I739 Peripheral vascular disease, unspecified: Secondary | ICD-10-CM

## 2023-01-08 DIAGNOSIS — L84 Corns and callosities: Secondary | ICD-10-CM | POA: Diagnosis not present

## 2023-01-08 DIAGNOSIS — B351 Tinea unguium: Secondary | ICD-10-CM | POA: Diagnosis not present

## 2023-01-08 MED ORDER — DICLOFENAC SODIUM 1 % EX CREA: 1.0000 g | TOPICAL_CREAM | Freq: Every day | CUTANEOUS | 0 refills | Status: DC

## 2023-01-08 NOTE — Progress Notes (Signed)
  Subjective:  Patient ID: Joe [name], male    DOB: 03/05/1971,  MRN: [id number]  Chief Complaint  Patient presents with   Nail Problem    Diabetic Foot Care-nail trim      52 y.o. male presents with the above complaint. History confirmed with patient. Patient presenting with pain related to dystrophic thickened elongated nails. Patient is unable to trim own nails related to nail dystrophy and/or mobility issues. Patient does have a history of T2DM. Patient does  have callus present located at the plantar aspect left hallux IPJ causing pain.   Objective:  Physical Exam: warm, good capillary refill nail exam onychomycosis of the toenails, onycholysis, and dystrophic nails DP pulses palpable, PT pulses palpable, and protective sensation absent Left Foot:  Pain with palpation of nails due to elongation and dystrophic growth. Attention to left hallux, hyperkeratotic lesion at plantar IPJ with pain. No underlying ulceration Right Foot: Pain with palpation of nails due to elongation and dystrophic growth.   Assessment:   1. Pre-ulcerative calluses   2. Pain due to onychomycosis of toenails of both feet   3. PAD (peripheral artery disease) (HCC)   4. DM type 2 with diabetic peripheral neuropathy (HCC)       Plan:  Patient was evaluated and treated and all questions answered.  #Hyperkeratotic lesions/pre ulcerative calluses present subleft hallux plantar IPJ All symptomatic hyperkeratoses x 1 separate lesions were safely debrided with a sterile #10 blade to patient's level of comfort without incident. We discussed preventative and palliative care of these lesions including supportive and accommodative shoegear, padding, prefabricated and custom molded accommodative orthoses, use of a pumice stone and lotions/creams daily.  #Onychomycosis with pain  -Nails palliatively debrided as below. -Educated on self-care  Procedure: Nail Debridement Rationale: Pain Type of Debridement:  manual, sharp debridement. Instrumentation: Nail nipper, rotary burr. Number of Nails: 10  Return in about 3 months (around 04/10/2023) for [facility].         Corinna Gab, DPM [facility]

## 2023-01-15 ENCOUNTER — Ambulatory Visit: Payer: 59

## 2023-01-15 DIAGNOSIS — I255 Ischemic cardiomyopathy: Secondary | ICD-10-CM | POA: Diagnosis not present

## 2023-01-15 DIAGNOSIS — I5021 Acute systolic (congestive) heart failure: Secondary | ICD-10-CM

## 2023-01-16 ENCOUNTER — Ambulatory Visit: Payer: 59 | Attending: Cardiology | Admitting: Cardiology

## 2023-01-16 ENCOUNTER — Encounter: Payer: Self-pay | Admitting: Cardiology

## 2023-01-16 VITALS — BP 108/76 | HR 96 | Ht 75.0 in | Wt 330.0 lb

## 2023-01-16 DIAGNOSIS — G4733 Obstructive sleep apnea (adult) (pediatric): Secondary | ICD-10-CM | POA: Diagnosis not present

## 2023-01-16 DIAGNOSIS — I10 Essential (primary) hypertension: Secondary | ICD-10-CM | POA: Diagnosis not present

## 2023-01-16 LAB — CUP PACEART REMOTE DEVICE CHECK
Battery Remaining Longevity: 37 mo
Battery Remaining Percentage: 46 %
Battery Voltage: 2.93 V
Brady Statistic AP VP Percent: 1 %
Brady Statistic AP VS Percent: 1 %
Brady Statistic AS VP Percent: 93 %
Brady Statistic AS VS Percent: 4.1 %
Brady Statistic RA Percent Paced: 1 %
Date Time Interrogation Session: 20240730020117
HighPow Impedance: 79 Ohm
HighPow Impedance: 79 Ohm
Implantable Lead Connection Status: 753985
Implantable Lead Connection Status: 753985
Implantable Lead Connection Status: 753985
Implantable Lead Implant Date: 20200930
Implantable Lead Implant Date: 20200930
Implantable Lead Implant Date: 20200930
Implantable Lead Location: 753858
Implantable Lead Location: 753859
Implantable Lead Location: 753860
Implantable Pulse Generator Implant Date: 20200930
Lead Channel Impedance Value: 390 Ohm
Lead Channel Impedance Value: 450 Ohm
Lead Channel Impedance Value: 980 Ohm
Lead Channel Pacing Threshold Amplitude: 0.75 V
Lead Channel Pacing Threshold Amplitude: 0.75 V
Lead Channel Pacing Threshold Amplitude: 1 V
Lead Channel Pacing Threshold Pulse Width: 0.5 ms
Lead Channel Pacing Threshold Pulse Width: 0.5 ms
Lead Channel Pacing Threshold Pulse Width: 0.5 ms
Lead Channel Sensing Intrinsic Amplitude: 11.2 mV
Lead Channel Sensing Intrinsic Amplitude: 2.4 mV
Lead Channel Setting Pacing Amplitude: 2 V
Lead Channel Setting Pacing Amplitude: 2.5 V
Lead Channel Setting Pacing Amplitude: 2.5 V
Lead Channel Setting Pacing Pulse Width: 0.5 ms
Lead Channel Setting Pacing Pulse Width: 0.5 ms
Lead Channel Setting Sensing Sensitivity: 0.5 mV
Pulse Gen Serial Number: 9901124
Zone Setting Status: 755011

## 2023-01-16 NOTE — Addendum Note (Signed)
Addended by: Rexene Edison L on: 01/16/2023 11:20 AM   Modules accepted: Orders

## 2023-01-16 NOTE — Patient Instructions (Signed)
Medication Instructions:  Your physician recommends that you continue on your current medications as directed. Please refer to the Current Medication list given to you today.  *If you need a refill on your cardiac medications before your next appointment, please call your pharmacy*   Lab Work: None.  If you have labs (blood work) drawn today and your tests are completely normal, you will receive your results only by: MyChart Message (if you have MyChart) OR A paper copy in the mail If you have any lab test that is abnormal or we need to change your treatment, we will call you to review the results.   Testing/Procedures: Your physician has recommended that you have a sleep study. This test records several body functions during sleep, including: brain activity, eye movement, oxygen and carbon dioxide blood levels, heart rate and rhythm, breathing rate and rhythm, the flow of air through your mouth and nose, snoring, body muscle movements, and chest and belly movement. Someone from our sleep team will call you to set up this appointment once insurance has approved.    Follow-Up: At [facility], you and your health needs are our priority.  As part of our continuing mission to provide you with exceptional heart care, we have created designated Provider Care Teams.  These Care Teams include your primary Cardiologist (physician) and Advanced Practice Providers (APPs -  Physician Assistants and Nurse Practitioners) who all work together to provide you with the care you need, when you need it.  We recommend signing up for the patient portal called "MyChart".  Sign up information is provided on this After Visit Summary.  MyChart is used to connect with patients for Virtual Visits (Telemedicine).  Patients are able to view lab/test results, encounter notes, upcoming appointments, etc.  Non-urgent messages can be sent to your provider as well.   To learn more about what you can do with MyChart,  go to [email or web address].    Your next appointment will be dependent on the results of your testing and it will be with:     Provider:   Dr. Armanda Magic, MD

## 2023-01-16 NOTE — Progress Notes (Signed)
Sleep Medicine CONSULT Note    Date:  01/16/2023   ID:  Gay Filler, DOB 05-23-1971, MRN [id number]  PCP:  Lonie Peak, PA-C  Cardiologist: [name], MD   Chief Complaint  Patient presents with   New Patient (Initial Visit)    Obstructive sleep apnea    History of Present Illness:  Joe [name] is a 52 y.o. male who is being seen today for the evaluation of obstructive sleep apnea at the request of [name], MD.  This is a 52 year old obese male with a history of chronic systolic CHF, CAD, hypertension, GERD, hyperlipidemia, ischemic cardiomyopathy, diabetes mellitus and obstructive sleep apnea.  I saw him back in 2021 at which time he was not using his CPAP and was having severe snoring.    A repeat home sleep study was ordered which showed moderate OSA with an AHI of 27.3/hr with minimal Central sleep apnea with a pAHIc of 3.2/hr. He also had nocturnal hypoxemia with 102 minutes of O2 sats < 88%. He was placed on auto CPAP.  Unfortunately he did not tolerate his CPAP at that point and would feel bloated in the morning and very uncomfortable.  He felt the pressure was too high and he was using a nasal mask and breathing through his nose a lot.  At that time he was referred back to the sleep lab for a repeat CPAP titration and was titrated to 18 cm H2O.    He never followed up with me and then was completely lost to follow-up for his sleep apnea.  He was recently seen by Dr. Loman Brooklyn and because he was not using his CPAP he was referred back to reestablish sleep medicine care.  He is now here for a sleep medicine consultation.  He tells me that he was on a Respironics device which was recalled and he sent it back and has not been on a CPAP since then.  He has lost over 50lbs but still snores some when sleeping and still has some sleepiness during the day with a Stop Bang score of 7.    Past Medical History:  Diagnosis Date   Abnormal EKG     Acute systolic heart failure (HCC) 09/06/2017   Angina pectoris (HCC) 09/05/2017   Body mass index 45.0-49.9, adult (HCC)    CAD S/P percutaneous coronary angioplasty 09/07/2017   mLAD and dLAD PCI with DES 09/06/17   CHF (congestive heart failure) (HCC)    Chronic systolic (congestive) heart failure (HCC) 03/19/2019   Complication of anesthesia 1995   "had hard time waking up; breathing w/vasectomy"   Coronary artery disease    Erectile dysfunction    Essential hypertension, benign    Fatigue    GERD (gastroesophageal reflux disease)    Gout    High cholesterol    "just started tx yesterday" (09/06/2017)   History of gout    "RX prn" (09/06/2017)   Ischemic cardiomyopathy 09/07/2017   EF 25-35%   Muscular chest pain    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   Below Left Nare (MOH's)   Obesity    Obstructive sleep apnea    OSA on CPAP    Plantar fasciitis    Pre-operative clearance 02/11/2018   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/02/2019   Saint Jude   Right bundle branch block    Shortness of breath 09/05/2017   Testosterone deficiency    Type 2 diabetes mellitus with complication,  without long-term current use of insulin (HCC) 09/05/2017   Type II diabetes mellitus (HCC)    "started tx 08/28/2017"    Past Surgical History:  Procedure Laterality Date   BACK SURGERY     BIV ICD INSERTION CRT-D N/A 03/19/2019   Procedure: BIV ICD INSERTION CRT-D;  Surgeon: Regan Lemming, MD;  Location: [facility] INVASIVE CV LAB;  Service: Cardiovascular;  Laterality: N/A;   CORONARY ANGIOPLASTY WITH STENT PLACEMENT  09/06/2017   "3 stents"   CORONARY STENT INTERVENTION N/A 09/06/2017   Procedure: CORONARY STENT INTERVENTION;  Surgeon: Corky Crafts, MD;  Location: [facility] INVASIVE CV LAB;  Service: Cardiovascular;  Laterality: N/A;   CORONARY STENT INTERVENTION N/A 02/15/2018   Procedure: CORONARY STENT INTERVENTION;  Surgeon: Corky Crafts, MD;   Location: [facility] INVASIVE CV LAB;  Service: Cardiovascular;  Laterality: N/A;   KNEE ARTHROSCOPY Right    LEFT HEART CATH AND CORONARY ANGIOGRAPHY N/A 09/06/2017   Procedure: LEFT HEART CATH AND CORONARY ANGIOGRAPHY;  Surgeon: Corky Crafts, MD;  Location: [facility] INVASIVE CV LAB;  Service: Cardiovascular;  Laterality: N/A;   LEFT HEART CATH AND CORONARY ANGIOGRAPHY N/A 02/15/2018   Procedure: LEFT HEART CATH AND CORONARY ANGIOGRAPHY;  Surgeon: Corky Crafts, MD;  Location: [facility] INVASIVE CV LAB;  Service: Cardiovascular;  Laterality: N/A;   LEFT HEART CATH AND CORONARY ANGIOGRAPHY N/A 06/27/2018   Procedure: LEFT HEART CATH AND CORONARY ANGIOGRAPHY;  Surgeon: Corky Crafts, MD;  Location: [facility] INVASIVE CV LAB;  Service: Cardiovascular;  Laterality: N/A;   LEFT HEART CATH AND CORONARY ANGIOGRAPHY N/A 06/28/2020   Procedure: LEFT HEART CATH AND CORONARY ANGIOGRAPHY;  Surgeon: Swazil[place], Peter M, MD;  Location: [facility] INVASIVE CV LAB;  Service: Cardiovascular;  Laterality: N/A;   LUMBAR SPINE SURGERY  ~ 2001   Intradiscal Electrothermoplasty   RIGHT/LEFT HEART CATH AND CORONARY ANGIOGRAPHY N/A 03/07/2021   Procedure: RIGHT/LEFT HEART CATH AND CORONARY ANGIOGRAPHY;  Surgeon: Laurey Morale, MD;  Location: [facility] INVASIVE CV LAB;  Service: Cardiovascular;  Laterality: N/A;   VASECTOMY  1995    Current Medications: Current Meds  Medication Sig   acetaminophen (TYLENOL) 500 MG tablet Take 1,000 mg by mouth every 6 (six) hours as needed for moderate pain or headache.   allopurinol (ZYLOPRIM) 100 MG tablet Take 100 mg by mouth daily as needed (for gout).    aspirin EC 81 MG tablet Take 1 tablet (81 mg total) by mouth daily.   carvedilol (COREG) 12.5 MG tablet Take 1 tablet (12.5 mg total) by mouth 2 (two) times daily.   colchicine 0.6 MG tablet Take 0.6 mg by mouth daily as needed (Gout).   dapagliflozin propanediol (FARXIGA) 10 MG TABS tablet Take 1 tablet (10 mg total) by mouth daily.   diclofenac Sodium  (VOLTAREN) 1 % GEL APPLY 1 G TOPICALLY DAILY.   escitalopram (LEXAPRO) 10 MG tablet Take 10 mg by mouth every morning.   ezetimibe (ZETIA) 10 MG tablet TAKE 1 TABLET BY MOUTH EVERY DAY   Furosemide (FUROSCIX) 80 MG/10ML CTKT Inject 80 mg into the skin as directed.   gabapentin (NEURONTIN) 100 MG capsule Take 100 mg by mouth 2 (two) times daily.   ibuprofen (ADVIL) 200 MG tablet Take 800 mg by mouth every 8 (eight) hours as needed for headache or moderate pain.   indomethacin (INDOCIN) 50 MG capsule Take 50 mg by mouth daily as needed for mild pain or moderate pain (Gout).   isosorbide mononitrate (IMDUR) 30 MG 24 hr  tablet TAKE 1 TABLET BY MOUTH EVERY DAY   mexiletine (MEXITIL) 250 MG capsule Take 1 capsule (250 mg total) by mouth 2 (two) times daily.   nitroGLYCERIN (NITROSTAT) 0.4 MG SL tablet Place 0.4 mg under the tongue every 5 (five) minutes as needed for chest pain.   omeprazole (PRILOSEC) 40 MG capsule Take 1 capsule (40 mg total) by mouth daily.   OZEMPIC, 1 MG/DOSE, 4 MG/3ML SOPN Inject 1 mg into the skin once a week.   Polyethylene Glycol 400 (BLINK TEARS OP) Place 2 drops into both eyes 4 (four) times daily as needed (for dry eyes).   potassium chloride SA (KLOR-CON M) 20 MEQ tablet Take 1 tablet (20 mEq total) by mouth daily. When you take furoscix please take 40 meq of potassium   ranolazine (RANEXA) 500 MG 12 hr tablet Take 1 tablet (500 mg total) by mouth 2 (two) times daily.   rosuvastatin (CRESTOR) 40 MG tablet Take 1 tablet (40 mg total) by mouth daily.   sacubitril-valsartan (ENTRESTO) 24-26 MG Take 1 tablet by mouth 2 (two) times daily.   spironolactone (ALDACTONE) 25 MG tablet Take 1 tablet (25 mg total) by mouth daily.   torsemide (DEMADEX) 20 MG tablet Take 2 tablets (40 mg total) by mouth daily.   triamcinolone cream (KENALOG) 0.1 % Apply topically 2 (two) times daily.    Allergies:   Patient has no known allergies.   Social History   Socioeconomic History    Marital status: Married    Spouse name: Not on file   Number of children: Not on file   Years of education: Not on file   Highest education level: Not on file  Occupational History   Not on file  Tobacco Use   Smoking status: Never   Smokeless tobacco: Never  Vaping Use   Vaping status: Never Used  Substance and Sexual Activity   Alcohol use: No   Drug use: Never   Sexual activity: Not on file  Other Topics Concern   Not on file  Social History Narrative   Not on file   Social Determinants of Health   Financial Resource Strain: Not on file  Food Insecurity: Not on file  Transportation Needs: Not on file  Physical Activity: Not on file  Stress: Not on file  Social Connections: Unknown (10/28/2021)   Received from [facility], [facility]   Social Network    Social Network: Not on file     Family History:  The patient's family history includes Brain cancer in his mother; Diabetes Mellitus II in his brother and paternal grandfather; Lung cancer in his mother; Lymphoma in his father.   ROS:   Please see the history of present illness.    ROS All other systems reviewed and are negative.      No data to display             PHYSICAL EXAM:   VS:  BP 108/76   Pulse 96   Ht 6\' 3"  (1.905 m)   Wt (!) 330 lb (149.7 kg)   SpO2 98%   BMI 41.25 kg/m    GEN: Well nourished, well developed, in no acute distress  HEENT: normal  Neck: no JVD, carotid bruits, or masses Cardiac: RRR; no murmurs, rubs, or gallops,no edema.  Intact distal pulses bilaterally.  Respiratory:  clear to auscultation bilaterally, normal work of breathing GI: soft, nontender, nondistended, + BS MS: no deformity or atrophy  Skin: warm and dry, no  rash Neuro:  Alert and Oriented x 3, Strength and sensation are intact Psych: euthymic mood, full affect  Wt Readings from Last 3 Encounters:  01/16/23 (!) 330 lb (149.7 kg)  12/18/22 (!) 336 lb 6.4 oz (152.6 kg)  11/20/22 (!) 337 lb 12.8 oz (153.2  kg)      Studies/Labs Reviewed:  CPAP titration 2021   Recent Labs: 04/10/2022: Hemoglobin 14.9; Platelets 233 08/10/2022: ALT 13; NT-Pro BNP 351; TSH 2.910 11/08/2022: B Natriuretic Peptide 193.7 11/20/2022: BUN 14; Creatinine, Ser 1.17; Magnesium 2.3; Potassium 3.9; Sodium 139    ASSESSMENT:    1. Obstructive sleep apnea   2. Essential hypertension, benign      PLAN:  In order of problems listed above:  OSA  -He has a history of obstructive sleep apnea and had been on CPAP back in 2021 with a repeat CPAP titration 18 cm H2O but then never followed up afterwards and was lost to sleep care. -he still has daytime sleepiness with a STOP BANG score of 7 -I am going to order a split night sleep study to assess degree of OSA and try to get titrated on PAP that same night  Hypertension -BP controlled on exam today -Continue prescription drug management with carvedilol 12.5 mg twice daily, Imdur 30 mg daily, Entresto 24-26 mg twice daily and spironolactone 25 mg daily with as needed refills  Time Spent: 20 minutes total time of encounter, including 15 minutes spent in face-to-face patient care on the date of this encounter. This time includes coordination of care and counseling regarding above mentioned problem list. Remainder of non-face-to-face time involved reviewing chart documents/testing relevant to the patient encounter and documentation in the medical record. I have independently reviewed documentation from referring provider  Medication Adjustments/Labs and Tests Ordered: Current medicines are reviewed at length with the patient today.  Concerns regarding medicines are outlined above.  Medication changes, Labs and Tests ordered today are listed in the Patient Instructions below.  There are no Patient Instructions on file for this visit.   Signed, Armanda Magic, MD  01/16/2023 11:09 AM    [facility] [address] [place], [place], [place]  [address] Phone:  [phone]; Fax: [phone]

## 2023-01-17 ENCOUNTER — Encounter: Payer: Self-pay | Admitting: Cardiology

## 2023-01-18 MED ORDER — CARVEDILOL 12.5 MG PO TABS: 12.5000 mg | ORAL_TABLET | Freq: Two times a day (BID) | ORAL | 3 refills | Status: DC

## 2023-01-23 NOTE — Telephone Encounter (Signed)
Dr. Elberta Fortis recommends pt follow up with Dr. Bing Matter about this. Will forward to his team to follow up with pt on

## 2023-01-25 ENCOUNTER — Telehealth: Payer: Self-pay

## 2023-01-25 DIAGNOSIS — R0609 Other forms of dyspnea: Secondary | ICD-10-CM

## 2023-01-25 DIAGNOSIS — I10 Essential (primary) hypertension: Secondary | ICD-10-CM

## 2023-01-25 NOTE — Telephone Encounter (Signed)
Pt reported "I been feeling more tired here lately. Getting the dry heves and vomiting feeling like more pressure In my stomach and throat area and Seems like Retaining more fluid at times. The question is with my heart beating extra and racing. Would this be causing this by chance?" Dr. Bing Matter recommended labs. Message sent to pt. Labs ordered.

## 2023-02-01 NOTE — Progress Notes (Signed)
Remote ICD transmission.   

## 2023-02-15 ENCOUNTER — Ambulatory Visit (HOSPITAL_COMMUNITY)
Admission: RE | Admit: 2023-02-15 | Discharge: 2023-02-15 | Disposition: A | Discharge status: Home / Self Care | Payer: 59 | Source: Ambulatory Visit | Attending: Internal Medicine | Admitting: Internal Medicine

## 2023-02-15 ENCOUNTER — Other Ambulatory Visit (HOSPITAL_COMMUNITY): Payer: Self-pay

## 2023-02-15 ENCOUNTER — Encounter (HOSPITAL_COMMUNITY): Payer: Self-pay | Admitting: Internal Medicine

## 2023-02-15 ENCOUNTER — Ambulatory Visit (HOSPITAL_BASED_OUTPATIENT_CLINIC_OR_DEPARTMENT_OTHER)
Admission: RE | Admit: 2023-02-15 | Discharge: 2023-02-15 | Disposition: A | Discharge status: Home / Self Care | Payer: 59 | Source: Ambulatory Visit | Attending: Internal Medicine | Admitting: Internal Medicine

## 2023-02-15 VITALS — BP 90/60 | HR 94 | Wt 346.6 lb

## 2023-02-15 DIAGNOSIS — I2510 Atherosclerotic heart disease of native coronary artery without angina pectoris: Secondary | ICD-10-CM

## 2023-02-15 DIAGNOSIS — I255 Ischemic cardiomyopathy: Secondary | ICD-10-CM | POA: Insufficient documentation

## 2023-02-15 DIAGNOSIS — G4733 Obstructive sleep apnea (adult) (pediatric): Secondary | ICD-10-CM

## 2023-02-15 DIAGNOSIS — I493 Ventricular premature depolarization: Secondary | ICD-10-CM | POA: Diagnosis not present

## 2023-02-15 DIAGNOSIS — I5022 Chronic systolic (congestive) heart failure: Secondary | ICD-10-CM

## 2023-02-15 DIAGNOSIS — I5023 Acute on chronic systolic (congestive) heart failure: Secondary | ICD-10-CM

## 2023-02-15 DIAGNOSIS — E119 Type 2 diabetes mellitus without complications: Secondary | ICD-10-CM | POA: Diagnosis not present

## 2023-02-15 DIAGNOSIS — I081 Rheumatic disorders of both mitral and tricuspid valves: Secondary | ICD-10-CM | POA: Diagnosis not present

## 2023-02-15 LAB — BASIC METABOLIC PANEL
Anion gap: 8 (ref 5–15)
BUN: 12 mg/dL (ref 6–20)
CO2: 28 mmol/L (ref 22–32)
Calcium: 8.4 mg/dL — ABNORMAL LOW (ref 8.9–10.3)
Chloride: 101 mmol/L (ref 98–111)
Creatinine, Ser: 1.21 mg/dL (ref 0.61–1.24)
GFR, Estimated: >60 mL/min (ref >60)
Glucose, Bld: 110 mg/dL — ABNORMAL HIGH (ref 70–99)
Potassium: 3.2 mmol/L — ABNORMAL LOW (ref 3.5–5.1)
Sodium: 137 mmol/L (ref 135–145)

## 2023-02-15 LAB — BRAIN NATRIURETIC PEPTIDE: B Natriuretic Peptide: 237.5 pg/mL — ABNORMAL HIGH (ref 0.0–100.0)

## 2023-02-15 LAB — ECHOCARDIOGRAM COMPLETE
Est EF: <20
S' Lateral: 7 cm

## 2023-02-15 MED ORDER — METOLAZONE 5 MG PO TABS: 5.0000 mg | ORAL_TABLET | Freq: Every day | ORAL | 0 refills | Status: DC

## 2023-02-15 MED ORDER — PERFLUTREN LIPID MICROSPHERE
1.0000 mL | INTRAVENOUS | Status: AC | PRN
  Administered 2023-02-15: 3 mL via INTRAVENOUS
  Filled 2023-02-15: qty 10

## 2023-02-15 MED ORDER — PERFLUTREN LIPID MICROSPHERE
1.0000 mL | INTRAVENOUS | Status: DC | PRN
  Administered 2023-02-15: 3 mL via INTRAVENOUS
  Filled 2023-02-15: qty 10

## 2023-02-15 NOTE — Progress Notes (Signed)
ADVANCED HF CLINIC NOTE  PCP: Lonie Peak, PA-C Primary Cardiologist: Dr. Bing Matter  HF Cardiologist: Dr. Gala Romney  HPI: Joe [name] is a 52 y.o. male with h/o morbid obesity, DM2, HTN, HL, OSA, CAD and systolic HF (EF 20-25%) referred by Dr. Bing Matter for further management of his HF   Has h/o RBBB but never had any other heart problems until 3/19. Saw Dr. Mauri Brooklyn in 3/19 due to CP and SOB. Cath EF 25-35% Severe diabetic CAD with LAD disease (m90 and m75),  RCA d50, LCA m50.   Echo 7/20 EF 20-25% moderate RV dysfunction  Cath January 2020 which showed 40% distal LAD, LAD/LCx stents were patent, mid RCA got 25% lesion right PDA had 50% stenosis the circumflex artery were open.  No intervention has been required.    Had cath 1/22 with stable CAD  Myoview 12/19 EF 30% Apical and infero-apical scar  Had St. Jude CRT-D placed 03/19/19 with Dr. Elberta Fortis.  Admitted 9/22 for CP. HsTn was 4. Underwent cardiac cath on 03/07/2021 which showed patent stents in the LAD and LCX with diffuse distal LAD which may be a trigger for angina as well as mildly elevated RA pressure with normal PCWP and LVEDP and preserved cardiac output.   Echo 9/22 EF 35-40%  Admitted 6/23 with CP. Hstrop 35. Felt to be non-cardiac. Echo EF 30-35%  Seen in ED 04/10/22 with SOB, found to be volume overloaded. BNP only 172. Hst trop 37->47. CXR with pulmonary edema. Given IV lasix with > 2L out. Refused hospital admission, had been in [facility] x to get fluids back.  Follow up 10/23, NYHA III and volume up, ReDs 43%. Given Furoscix x 3 days, then increased lasix to 80 daily alternating with 40 mg qod. Follow up 2/24, stable  NYHA II-III and volume OK. ReDs 36%, spiro 12.5 mg daily restarted.   Follow up 5/24, worse NYHA IIIb and volume up. Lasix stopped, instructed to use Furoscix x 2 days then start torsemide 40 mg daily thereafter.  Today he returns for close HF follow up with his wife. For past few weeks  markedly fluid overloaded. SOB with minimal exertion. + Orthopnea or PND Compliant with meds Taking torsemide 40 daily.   Echo today 02/16/23 EF 20-25% Personally reviewed    Cardiac Studies - Echo (6/23): EF 30-35%  - Echo (9/22): EF 35-40%  - R/LHC (9/22): patent stents in the LAD and LCX with diffuse distal LAD, which may have been trigger for angina. RA mean 11 PA 36/7 (mean 12) PCWP mean 12 CO/CI (Fick) 6.54/2.33 PVR 1.2 WU  - LHC (1/22): stable CAD  - LHC (1/20): 40% distal LAD, LAD/LCx stents were patent, mid RCA got 25% lesion right PDA had 50% stenosis the circumflex artery were open.  No intervention has been required.    - Echo (7/20): EF 20-25% moderate RV dysfunction  - Cath (3/19): EF 25-35% Severe diabetic CAD with LAD disease (m90 and m75),  RCA d50, LCA m50.   Past Medical History:  Diagnosis Date   Abnormal EKG    Acute systolic heart failure (HCC) 09/06/2017   Angina pectoris (HCC) 09/05/2017   Body mass index 45.0-49.9, adult (HCC)    CAD S/P percutaneous coronary angioplasty 09/07/2017   mLAD and dLAD PCI with DES 09/06/17   CHF (congestive heart failure) (HCC)    Chronic systolic (congestive) heart failure (HCC) 03/19/2019   Complication of anesthesia 1995   "had hard time waking up; breathing w/vasectomy"  Coronary artery disease    Erectile dysfunction    Essential hypertension, benign    Fatigue    GERD (gastroesophageal reflux disease)    Gout    High cholesterol    "just started tx yesterday" (09/06/2017)   History of gout    "RX prn" (09/06/2017)   Ischemic cardiomyopathy 09/07/2017   EF 25-35%   Muscular chest pain    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   Below Left Nare (MOH's)   Obesity    Obstructive sleep apnea    OSA on CPAP    Plantar fasciitis    Pre-operative clearance 02/11/2018   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/02/2019   Saint Jude   Right bundle branch block    Shortness of  breath 09/05/2017   Testosterone deficiency    Type 2 diabetes mellitus with complication, without long-term current use of insulin (HCC) 09/05/2017   Type II diabetes mellitus (HCC)    "started tx 08/28/2017"   Current Outpatient Medications  Medication Sig Dispense Refill   acetaminophen (TYLENOL) 500 MG tablet Take 1,000 mg by mouth every 6 (six) hours as needed for moderate pain or headache.     allopurinol (ZYLOPRIM) 100 MG tablet Take 100 mg by mouth daily.     aspirin EC 81 MG tablet Take 1 tablet (81 mg total) by mouth daily. 90 tablet 3   carvedilol (COREG) 12.5 MG tablet Take 1 tablet (12.5 mg total) by mouth 2 (two) times daily. 180 tablet 3   colchicine 0.6 MG tablet Take 0.6 mg by mouth daily as needed (Gout).     diclofenac Sodium (VOLTAREN) 1 % GEL APPLY 1 G TOPICALLY DAILY. 100 g 0   escitalopram (LEXAPRO) 10 MG tablet Take 10 mg by mouth every morning.     ezetimibe (ZETIA) 10 MG tablet TAKE 1 TABLET BY MOUTH EVERY DAY 90 tablet 1   gabapentin (NEURONTIN) 100 MG capsule Take 100 mg by mouth 2 (two) times daily.     ibuprofen (ADVIL) 200 MG tablet Take 800 mg by mouth every 8 (eight) hours as needed for headache or moderate pain.     indomethacin (INDOCIN) 50 MG capsule Take 50 mg by mouth daily as needed for mild pain or moderate pain (Gout).     isosorbide mononitrate (IMDUR) 30 MG 24 hr tablet TAKE 1 TABLET BY MOUTH EVERY DAY 90 tablet 3   mexiletine (MEXITIL) 250 MG capsule Take 1 capsule (250 mg total) by mouth 2 (two) times daily. 60 capsule 11   nitroGLYCERIN (NITROSTAT) 0.4 MG SL tablet Place 0.4 mg under the tongue every 5 (five) minutes as needed for chest pain.     omeprazole (PRILOSEC) 40 MG capsule Take 1 capsule (40 mg total) by mouth daily. 90 capsule 1   OZEMPIC, 1 MG/DOSE, 4 MG/3ML SOPN Inject 1 mg into the skin once a week.     Polyethylene Glycol 400 (BLINK TEARS OP) Place 2 drops into both eyes 4 (four) times daily as needed (for dry eyes).     potassium  chloride SA (KLOR-CON M) 20 MEQ tablet Take 1 tablet (20 mEq total) by mouth daily. When you take furoscix please take 40 meq of potassium 36 tablet 6   ranolazine (RANEXA) 500 MG 12 hr tablet Take 1 tablet (500 mg total) by mouth 2 (two) times daily. 180 tablet 2   rosuvastatin (CRESTOR) 40 MG tablet Take 1 tablet (40 mg total) by mouth daily. 90 tablet 2  spironolactone (ALDACTONE) 25 MG tablet Take 1 tablet (25 mg total) by mouth daily. 90 tablet 3   torsemide (DEMADEX) 20 MG tablet Take 2 tablets (40 mg total) by mouth daily. 60 tablet 6   triamcinolone cream (KENALOG) 0.1 % Apply topically 2 (two) times daily.     dapagliflozin propanediol (FARXIGA) 10 MG TABS tablet Take 1 tablet (10 mg total) by mouth daily. (Patient not taking: Reported on 02/15/2023) 90 tablet 2   Furosemide (FUROSCIX) 80 MG/10ML CTKT Inject 80 mg into the skin as directed. (Patient not taking: Reported on 02/15/2023) 4 each 0   sacubitril-valsartan (ENTRESTO) 24-26 MG Take 1 tablet by mouth 2 (two) times daily. (Patient not taking: Reported on 02/15/2023) 60 tablet 2   No current facility-administered medications for this encounter.   No Known Allergies  Social History   Socioeconomic History   Marital status: Married    Spouse name: Not on file   Number of children: Not on file   Years of education: Not on file   Highest education level: Not on file  Occupational History   Not on file  Tobacco Use   Smoking status: Never   Smokeless tobacco: Never  Vaping Use   Vaping status: Never Used  Substance and Sexual Activity   Alcohol use: No   Drug use: Never   Sexual activity: Not on file  Other Topics Concern   Not on file  Social History Narrative   Not on file   Social Determinants of Health   Financial Resource Strain: Not on file  Food Insecurity: Not on file  Transportation Needs: Not on file  Physical Activity: Not on file  Stress: Not on file  Social Connections: Unknown (10/28/2021)   Received  from [facility], [facility]   Social Network    Social Network: Not on file  Intimate Partner Violence: Unknown (09/20/2021)   Received from [employer], [facility]   HITS    Physically Hurt: Not on file    Insult or Talk Down To: Not on file    Threaten Physical Harm: Not on file    Scream or Curse: Not on file   Family History  Problem Relation Age of Onset   Lymphoma Father    Diabetes Mellitus II Brother    Diabetes Mellitus II Paternal Grandfather    Lung cancer Mother    Brain cancer Mother    BP 90/60   Pulse 94   Wt (!) 157.2 kg (346 lb 9.6 oz)   SpO2 96%   BMI 43.32 kg/m   Wt Readings from Last 3 Encounters:  02/15/23 (!) 157.2 kg (346 lb 9.6 oz)  01/16/23 (!) 149.7 kg (330 lb)  12/18/22 (!) 152.6 kg (336 lb 6.4 oz)   PHYSICAL EXAM: General:  Weak appearing. No resp difficulty HEENT: normal Neck: supple. JVP to ear Carotids 2+ bilat; no bruits. No lymphadenopathy or thryomegaly appreciated. Cor: PMI nondisplaced. Regular rate & rhythm. No rubs, gallops or murmurs. Lungs: clear Abdomen: obese soft, nontender, + distended. No hepatosplenomegaly. No bruits or masses. Good bowel sounds. Extremities: no cyanosis, clubbing, rash, 2-3+ edema Neuro: alert & orientedx3, cranial nerves grossly intact. moves all 4 extremities w/o difficulty. Affect pleasant  ReDs 47%  Device interrogation (personally reviewed): CorVue ok, BiV pacing 94%, no VT/AF Personally reviewed  ASSESSMENT & PLAN:  1. Chronic Systolic HF - due to iCM - Echo 8/20 EF 20-25%  - Echo 9/22 35-40% - Echo 6/23 EF 30-35% - Echo  today 02/16/23 EF 20-25% Personally reviewed - Worse NYHA IIIB-IV with marked volume overload - We discussed possible admission today but he would like to avoid if possible - Increase torsemide to 80mg  daily + metolazone 5mg  + kcl 40 for next 3 days - Continue spiro 25 mg daily. - Continue Entresto 24/26 mg bid.  - Continue Farxiga 10 mg daily. No GU symptoms. -  Stop carvedilol - He appears to have end-stage HF with marked volume overload and worsening EF on echo. We discussed admission but he would like to avoid.  - If symptoms worsening will need admission ASAP. Otherwise see back next week with plans to start w/u for advanced therapies  2. CAD with chronic stable angina - s/p multiple stents to LAD and LCX - cath 9/22 stable CAD - No s/s angina. - Continue ASA, beta blocker and Imdur 30 mg daily. - Continue Ranexa 500 mg bid. - Followed Dr. Kirtland Bouchard.  3. DM2 - A1c 7.2 (1/24) - Continue Farxiga.  4. OSA - Not using CPAP, says machine was recalled a while ago - Follows with Dr. Mayford Knife  5. Obesity - Body mass index is 43.32 kg/m. - has struggled with weight loss - Continue Ozempic  6. Frequent PVCs - On mexilitene 200 mg bid - Zio 5/22 PVC burden 5.4%.  - Followed by Dr. Elberta Fortis - Continue mexilitene  Total time spent 45 minutes. Over half that time spent discussing above.   Arvilla Meres, MD  3:08 PM

## 2023-02-15 NOTE — H&P (View-Only) (Signed)
ADVANCED HF CLINIC NOTE  PCP: Lonie Peak, PA-C Primary Cardiologist: Dr. Bing Matter  HF Cardiologist: Dr. Gala Romney  HPI: Joe [name] is a 52 y.o. male with h/o morbid obesity, DM2, HTN, HL, OSA, CAD and systolic HF (EF 20-25%) referred by Dr. Bing Matter for further management of his HF   Has h/o RBBB but never had any other heart problems until 3/19. Saw Dr. Mauri Brooklyn in 3/19 due to CP and SOB. Cath EF 25-35% Severe diabetic CAD with LAD disease (m90 and m75),  RCA d50, LCA m50.   Echo 7/20 EF 20-25% moderate RV dysfunction  Cath January 2020 which showed 40% distal LAD, LAD/LCx stents were patent, mid RCA got 25% lesion right PDA had 50% stenosis the circumflex artery were open.  No intervention has been required.    Had cath 1/22 with stable CAD  Myoview 12/19 EF 30% Apical and infero-apical scar  Had St. Jude CRT-D placed 03/19/19 with Dr. Elberta Fortis.  Admitted 9/22 for CP. HsTn was 4. Underwent cardiac cath on 03/07/2021 which showed patent stents in the LAD and LCX with diffuse distal LAD which may be a trigger for angina as well as mildly elevated RA pressure with normal PCWP and LVEDP and preserved cardiac output.   Echo 9/22 EF 35-40%  Admitted 6/23 with CP. Hstrop 35. Felt to be non-cardiac. Echo EF 30-35%  Seen in ED 04/10/22 with SOB, found to be volume overloaded. BNP only 172. Hst trop 37->47. CXR with pulmonary edema. Given IV lasix with > 2L out. Refused hospital admission, had been [facility] x to get fluids back.  Follow up 10/23, NYHA III and volume up, ReDs 43%. Given Furoscix x 3 days, then increased lasix to 80 daily alternating with 40 mg qod. Follow up 2/24, stable  NYHA II-III and volume OK. ReDs 36%, spiro 12.5 mg daily restarted.   Follow up 5/24, worse NYHA IIIb and volume up. Lasix stopped, instructed to use Furoscix x 2 days then start torsemide 40 mg daily thereafter.  Today he returns for close HF follow up with his wife. For past few weeks  markedly fluid overloaded. SOB with minimal exertion. + Orthopnea or PND Compliant with meds Taking torsemide 40 daily.   Echo today 02/16/23 EF 20-25% Personally reviewed    Cardiac Studies - Echo (6/23): EF 30-35%  - Echo (9/22): EF 35-40%  - R/LHC (9/22): patent stents in the LAD and LCX with diffuse distal LAD, which may have been trigger for angina. RA mean 11 PA 36/7 (mean 12) PCWP mean 12 CO/CI (Fick) 6.54/2.33 PVR 1.2 WU  - LHC (1/22): stable CAD  - LHC (1/20): 40% distal LAD, LAD/LCx stents were patent, mid RCA got 25% lesion right PDA had 50% stenosis the circumflex artery were open.  No intervention has been required.    - Echo (7/20): EF 20-25% moderate RV dysfunction  - Cath (3/19): EF 25-35% Severe diabetic CAD with LAD disease (m90 and m75),  RCA d50, LCA m50.   Past Medical History:  Diagnosis Date   Abnormal EKG    Acute systolic heart failure (HCC) 09/06/2017   Angina pectoris (HCC) 09/05/2017   Body mass index 45.0-49.9, adult (HCC)    CAD S/P percutaneous coronary angioplasty 09/07/2017   mLAD and dLAD PCI with DES 09/06/17   CHF (congestive heart failure) (HCC)    Chronic systolic (congestive) heart failure (HCC) 03/19/2019   Complication of anesthesia 1995   "had hard time waking up; breathing w/vasectomy"  Coronary artery disease    Erectile dysfunction    Essential hypertension, benign    Fatigue    GERD (gastroesophageal reflux disease)    Gout    High cholesterol    "just started tx yesterday" (09/06/2017)   History of gout    "RX prn" (09/06/2017)   Ischemic cardiomyopathy 09/07/2017   EF 25-35%   Muscular chest pain    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   Below Left Nare (MOH's)   Obesity    Obstructive sleep apnea    OSA on CPAP    Plantar fasciitis    Pre-operative clearance 02/11/2018   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/02/2019   Saint Jude   Right bundle branch block    Shortness of  breath 09/05/2017   Testosterone deficiency    Type 2 diabetes mellitus with complication, without long-term current use of insulin (HCC) 09/05/2017   Type II diabetes mellitus (HCC)    "started tx 08/28/2017"   Current Outpatient Medications  Medication Sig Dispense Refill   acetaminophen (TYLENOL) 500 MG tablet Take 1,000 mg by mouth every 6 (six) hours as needed for moderate pain or headache.     allopurinol (ZYLOPRIM) 100 MG tablet Take 100 mg by mouth daily.     aspirin EC 81 MG tablet Take 1 tablet (81 mg total) by mouth daily. 90 tablet 3   carvedilol (COREG) 12.5 MG tablet Take 1 tablet (12.5 mg total) by mouth 2 (two) times daily. 180 tablet 3   colchicine 0.6 MG tablet Take 0.6 mg by mouth daily as needed (Gout).     diclofenac Sodium (VOLTAREN) 1 % GEL APPLY 1 G TOPICALLY DAILY. 100 g 0   escitalopram (LEXAPRO) 10 MG tablet Take 10 mg by mouth every morning.     ezetimibe (ZETIA) 10 MG tablet TAKE 1 TABLET BY MOUTH EVERY DAY 90 tablet 1   gabapentin (NEURONTIN) 100 MG capsule Take 100 mg by mouth 2 (two) times daily.     ibuprofen (ADVIL) 200 MG tablet Take 800 mg by mouth every 8 (eight) hours as needed for headache or moderate pain.     indomethacin (INDOCIN) 50 MG capsule Take 50 mg by mouth daily as needed for mild pain or moderate pain (Gout).     isosorbide mononitrate (IMDUR) 30 MG 24 hr tablet TAKE 1 TABLET BY MOUTH EVERY DAY 90 tablet 3   mexiletine (MEXITIL) 250 MG capsule Take 1 capsule (250 mg total) by mouth 2 (two) times daily. 60 capsule 11   nitroGLYCERIN (NITROSTAT) 0.4 MG SL tablet Place 0.4 mg under the tongue every 5 (five) minutes as needed for chest pain.     omeprazole (PRILOSEC) 40 MG capsule Take 1 capsule (40 mg total) by mouth daily. 90 capsule 1   OZEMPIC, 1 MG/DOSE, 4 MG/3ML SOPN Inject 1 mg into the skin once a week.     Polyethylene Glycol 400 (BLINK TEARS OP) Place 2 drops into both eyes 4 (four) times daily as needed (for dry eyes).     potassium  chloride SA (KLOR-CON M) 20 MEQ tablet Take 1 tablet (20 mEq total) by mouth daily. When you take furoscix please take 40 meq of potassium 36 tablet 6   ranolazine (RANEXA) 500 MG 12 hr tablet Take 1 tablet (500 mg total) by mouth 2 (two) times daily. 180 tablet 2   rosuvastatin (CRESTOR) 40 MG tablet Take 1 tablet (40 mg total) by mouth daily. 90 tablet 2  spironolactone (ALDACTONE) 25 MG tablet Take 1 tablet (25 mg total) by mouth daily. 90 tablet 3   torsemide (DEMADEX) 20 MG tablet Take 2 tablets (40 mg total) by mouth daily. 60 tablet 6   triamcinolone cream (KENALOG) 0.1 % Apply topically 2 (two) times daily.     dapagliflozin propanediol (FARXIGA) 10 MG TABS tablet Take 1 tablet (10 mg total) by mouth daily. (Patient not taking: Reported on 02/15/2023) 90 tablet 2   Furosemide (FUROSCIX) 80 MG/10ML CTKT Inject 80 mg into the skin as directed. (Patient not taking: Reported on 02/15/2023) 4 each 0   sacubitril-valsartan (ENTRESTO) 24-26 MG Take 1 tablet by mouth 2 (two) times daily. (Patient not taking: Reported on 02/15/2023) 60 tablet 2   No current facility-administered medications for this encounter.   No Known Allergies  Social History   Socioeconomic History   Marital status: Married    Spouse name: Not on file   Number of children: Not on file   Years of education: Not on file   Highest education level: Not on file  Occupational History   Not on file  Tobacco Use   Smoking status: Never   Smokeless tobacco: Never  Vaping Use   Vaping status: Never Used  Substance and Sexual Activity   Alcohol use: No   Drug use: Never   Sexual activity: Not on file  Other Topics Concern   Not on file  Social History Narrative   Not on file   Social Determinants of Health   Financial Resource Strain: Not on file  Food Insecurity: Not on file  Transportation Needs: Not on file  Physical Activity: Not on file  Stress: Not on file  Social Connections: Unknown (10/28/2021)   Received  from [facility], [facility]   Social Network    Social Network: Not on file  Intimate Partner Violence: Unknown (09/20/2021)   Received from [employer], [facility]   HITS    Physically Hurt: Not on file    Insult or Talk Down To: Not on file    Threaten Physical Harm: Not on file    Scream or Curse: Not on file   Family History  Problem Relation Age of Onset   Lymphoma Father    Diabetes Mellitus II Brother    Diabetes Mellitus II Paternal Grandfather    Lung cancer Mother    Brain cancer Mother    BP 90/60   Pulse 94   Wt (!) 157.2 kg (346 lb 9.6 oz)   SpO2 96%   BMI 43.32 kg/m   Wt Readings from Last 3 Encounters:  02/15/23 (!) 157.2 kg (346 lb 9.6 oz)  01/16/23 (!) 149.7 kg (330 lb)  12/18/22 (!) 152.6 kg (336 lb 6.4 oz)   PHYSICAL EXAM: General:  Weak appearing. No resp difficulty HEENT: normal Neck: supple. JVP to ear Carotids 2+ bilat; no bruits. No lymphadenopathy or thryomegaly appreciated. Cor: PMI nondisplaced. Regular rate & rhythm. No rubs, gallops or murmurs. Lungs: clear Abdomen: obese soft, nontender, + distended. No hepatosplenomegaly. No bruits or masses. Good bowel sounds. Extremities: no cyanosis, clubbing, rash, 2-3+ edema Neuro: alert & orientedx3, cranial nerves grossly intact. moves all 4 extremities w/o difficulty. Affect pleasant  ReDs 47%  Device interrogation (personally reviewed): CorVue ok, BiV pacing 94%, no VT/AF Personally reviewed  ASSESSMENT & PLAN:  1. Chronic Systolic HF - due to iCM - Echo 8/20 EF 20-25%  - Echo 9/22 35-40% - Echo 6/23 EF 30-35% - Echo  today 02/16/23 EF 20-25% Personally reviewed - Worse NYHA IIIB-IV with marked volume overload - We discussed possible admission today but he would like to avoid if possible - Increase torsemide to 80mg  daily + metolazone 5mg  + kcl 40 for next 3 days - Continue spiro 25 mg daily. - Continue Entresto 24/26 mg bid.  - Continue Farxiga 10 mg daily. No GU symptoms. -  Stop carvedilol - He appears to have end-stage HF with marked volume overload and worsening EF on echo. We discussed admission but he would like to avoid.  - If symptoms worsening will need admission ASAP. Otherwise see back next week with plans to start w/u for advanced therapies  2. CAD with chronic stable angina - s/p multiple stents to LAD and LCX - cath 9/22 stable CAD - No s/s angina. - Continue ASA, beta blocker and Imdur 30 mg daily. - Continue Ranexa 500 mg bid. - Followed Dr. Kirtland Bouchard.  3. DM2 - A1c 7.2 (1/24) - Continue Farxiga.  4. OSA - Not using CPAP, says machine was recalled a while ago - Follows with Dr. Mayford Knife  5. Obesity - Body mass index is 43.32 kg/m. - has struggled with weight loss - Continue Ozempic  6. Frequent PVCs - On mexilitene 200 mg bid - Zio 5/22 PVC burden 5.4%.  - Followed by Dr. Elberta Fortis - Continue mexilitene  Total time spent 45 minutes. Over half that time spent discussing above.   Arvilla Meres, MD  3:08 PM

## 2023-02-15 NOTE — Progress Notes (Signed)
ReDS Vest / Clip - 02/15/23 1500       ReDS Vest / Clip   Station Marker D    Ruler Value 40    ReDS Value Range High volume overload    ReDS Actual Value 47

## 2023-02-15 NOTE — Patient Instructions (Signed)
Medication Changes:  INCREASE TORSEMIDE TO 80MG  FOR THE NEXT 3 DAYS   TAKE METOLAZONE 5MG  FOR THE NEXT 3 DAYS   TAKE POTASSIUM FOR THE NEXT 3 DAYS   STOP TAKING CARVEDILOL   Lab Work:  Labs done today, your results will be available in MyChart, we will contact you for abnormal readings.  Special Instructions // Education:  IF YOU DO NOT URINATE TONIGHT YOU NEED TO GO TO THE HOSPITAL TOMORROW   CALL us WITH ISSUES [phone] OPTION 2   Follow-Up in: NEXT TUESDAY AT 830 AM WITH DR. Gala Romney   At the [facility], you and your health needs are our priority. We have a designated team specialized in the treatment of Heart Failure. This Care Team includes your primary Heart Failure Specialized Cardiologist (physician), Advanced Practice Providers (APPs- Physician Assistants and Nurse Practitioners), and Pharmacist who all work together to provide you with the care you need, when you need it.   You may see any of the following providers on your designated Care Team at your next follow up:  Dr. Arvilla Meres Dr. Marca Ancona Dr. Marcos Eke, NP Robbie Lis, [place] [facility], [place] Brynda Peon, NP Karle Plumber, PharmD   Please be sure to bring in all your medications bottles to every appointment.   Need to Contact us:  If you have any questions or concerns before your next appointment please send [place] a message through [place] or call our office at [phone].    TO LEAVE A MESSAGE FOR THE NURSE SELECT OPTION 2, PLEASE LEAVE A MESSAGE INCLUDING: YOUR NAME DATE OF BIRTH CALL BACK NUMBER REASON FOR CALL**this is important as we prioritize the call backs  YOU WILL RECEIVE A CALL BACK THE SAME DAY AS LONG AS YOU CALL BEFORE 4:00 PM

## 2023-02-20 ENCOUNTER — Telehealth (HOSPITAL_COMMUNITY): Payer: Self-pay | Admitting: *Deleted

## 2023-02-20 ENCOUNTER — Ambulatory Visit (HOSPITAL_COMMUNITY)
Admission: RE | Admit: 2023-02-20 | Discharge: 2023-02-20 | Disposition: A | Discharge status: Home / Self Care | Payer: 59 | Source: Ambulatory Visit | Attending: Family Medicine | Admitting: Family Medicine

## 2023-02-20 ENCOUNTER — Encounter (HOSPITAL_COMMUNITY): Payer: Self-pay

## 2023-02-20 ENCOUNTER — Other Ambulatory Visit (HOSPITAL_COMMUNITY): Payer: Self-pay | Admitting: Physician Assistant

## 2023-02-20 VITALS — BP 124/78 | HR 94 | Wt 313.2 lb

## 2023-02-20 DIAGNOSIS — I5022 Chronic systolic (congestive) heart failure: Secondary | ICD-10-CM | POA: Diagnosis present

## 2023-02-20 DIAGNOSIS — Z7982 Long term (current) use of aspirin: Secondary | ICD-10-CM | POA: Insufficient documentation

## 2023-02-20 DIAGNOSIS — R0609 Other forms of dyspnea: Secondary | ICD-10-CM | POA: Diagnosis not present

## 2023-02-20 DIAGNOSIS — G4733 Obstructive sleep apnea (adult) (pediatric): Secondary | ICD-10-CM | POA: Diagnosis not present

## 2023-02-20 DIAGNOSIS — E119 Type 2 diabetes mellitus without complications: Secondary | ICD-10-CM | POA: Insufficient documentation

## 2023-02-20 DIAGNOSIS — I25118 Atherosclerotic heart disease of native coronary artery with other forms of angina pectoris: Secondary | ICD-10-CM | POA: Diagnosis not present

## 2023-02-20 DIAGNOSIS — Z955 Presence of coronary angioplasty implant and graft: Secondary | ICD-10-CM | POA: Insufficient documentation

## 2023-02-20 DIAGNOSIS — I493 Ventricular premature depolarization: Secondary | ICD-10-CM | POA: Diagnosis not present

## 2023-02-20 DIAGNOSIS — E785 Hyperlipidemia, unspecified: Secondary | ICD-10-CM | POA: Insufficient documentation

## 2023-02-20 DIAGNOSIS — I110 Hypertensive heart disease with heart failure: Secondary | ICD-10-CM | POA: Diagnosis not present

## 2023-02-20 DIAGNOSIS — Z833 Family history of diabetes mellitus: Secondary | ICD-10-CM | POA: Insufficient documentation

## 2023-02-20 DIAGNOSIS — Z79899 Other long term (current) drug therapy: Secondary | ICD-10-CM | POA: Insufficient documentation

## 2023-02-20 DIAGNOSIS — Z7984 Long term (current) use of oral hypoglycemic drugs: Secondary | ICD-10-CM | POA: Insufficient documentation

## 2023-02-20 DIAGNOSIS — R5383 Other fatigue: Secondary | ICD-10-CM | POA: Diagnosis not present

## 2023-02-20 DIAGNOSIS — I5023 Acute on chronic systolic (congestive) heart failure: Secondary | ICD-10-CM

## 2023-02-20 LAB — COMPREHENSIVE METABOLIC PANEL
ALT: 21 U/L (ref 0–44)
AST: 25 U/L (ref 15–41)
Albumin: 3.5 g/dL (ref 3.5–5.0)
Alkaline Phosphatase: 100 U/L (ref 38–126)
Anion gap: 9 (ref 5–15)
BUN: 14 mg/dL (ref 6–20)
CO2: 33 mmol/L — ABNORMAL HIGH (ref 22–32)
Calcium: 9.5 mg/dL (ref 8.9–10.3)
Chloride: 94 mmol/L — ABNORMAL LOW (ref 98–111)
Creatinine, Ser: 1.01 mg/dL (ref 0.61–1.24)
GFR, Estimated: >60 mL/min (ref >60)
Glucose, Bld: 149 mg/dL — ABNORMAL HIGH (ref 70–99)
Potassium: 3 mmol/L — ABNORMAL LOW (ref 3.5–5.1)
Sodium: 136 mmol/L (ref 135–145)
Total Bilirubin: 1.6 mg/dL — ABNORMAL HIGH (ref 0.3–1.2)
Total Protein: 7.3 g/dL (ref 6.5–8.1)

## 2023-02-20 LAB — CBC
HCT: 48.7 % (ref 39.0–52.0)
Hemoglobin: 16 g/dL (ref 13.0–17.0)
MCH: 30.4 pg (ref 26.0–34.0)
MCHC: 32.9 g/dL (ref 30.0–36.0)
MCV: 92.4 fL (ref 80.0–100.0)
Platelets: 224 10*3/uL (ref 150–400)
RBC: 5.27 MIL/uL (ref 4.22–5.81)
RDW: 13.6 % (ref 11.5–15.5)
WBC: 7.1 10*3/uL (ref 4.0–10.5)
nRBC: 0 % (ref 0.0–0.2)

## 2023-02-20 LAB — BRAIN NATRIURETIC PEPTIDE: B Natriuretic Peptide: 321.7 pg/mL — ABNORMAL HIGH (ref 0.0–100.0)

## 2023-02-20 MED ORDER — POTASSIUM CHLORIDE CRYS ER 20 MEQ PO TBCR: EXTENDED_RELEASE_TABLET | ORAL | 5 refills | Status: DC

## 2023-02-20 MED ORDER — DAPAGLIFLOZIN PROPANEDIOL 10 MG PO TABS: 10.0000 mg | ORAL_TABLET | Freq: Every day | ORAL | 3 refills | Status: DC

## 2023-02-20 MED ORDER — TORSEMIDE 20 MG PO TABS: ORAL_TABLET | ORAL | 5 refills | Status: DC

## 2023-02-20 NOTE — Patient Instructions (Addendum)
RedsClip done today.  Labs done today. We will contact you only if your labs are abnormal.  RESTART Farxiga 10mg  (1 tablet) by mouth daily.  INCREASE Torsemide to 40mg  (2 tablets) by mouth every morning and 20mg  (1 tablet) by mouth every evening.   INCREASE Potassium to (2 tablets) by mouth every morning and (1 tablet) by mouth every evening.   No other medication changes were made. Please continue all current medications as prescribed.  Your physician recommends that you schedule a follow-up appointment in: 2 weeks  If you have any questions or concerns before your next appointment please send [place] a message through [place] or call our office at [phone].    TO LEAVE A MESSAGE FOR THE NURSE SELECT OPTION 2, PLEASE LEAVE A MESSAGE INCLUDING: YOUR NAME DATE OF BIRTH CALL BACK NUMBER REASON FOR CALL**this is important as we prioritize the call backs  YOU WILL RECEIVE A CALL BACK THE SAME DAY AS LONG AS YOU CALL BEFORE 4:00 PM   Do the following things EVERYDAY: Weigh yourself in the morning before breakfast. Write it down and keep it in a log. Take your medicines as prescribed Eat low salt foods--Limit salt (sodium) to 2000 mg per day.  Stay as active as you can everyday Limit all fluids for the day to less than 2 liters   At the [facility], you and your health needs are our priority. As part of our continuing mission to provide you with exceptional heart care, we have created designated Provider Care Teams. These Care Teams include your primary Cardiologist (physician) and Advanced Practice Providers (APPs- Physician Assistants and Nurse Practitioners) who all work together to provide you with the care you need, when you need it.   You may see any of the following providers on your designated Care Team at your next follow up: Dr Arvilla Meres Dr Marca Ancona Dr. Marcos Eke, NP Robbie Lis, [place] [facility] [place], [place] Brynda Peon, NP Karle Plumber, PharmD   Please be sure to bring in all your medications bottles to every appointment.    Thank you for choosing [facility]  '   You are scheduled for a Cardiac Catheterization on Friday, September 6 with Dr. Arvilla Meres.  1. Please arrive at the [facility] (Main Entrance A) at [facility]: [address] [place], [place] [address] at 1:00 PM (This time is 2 hour(s) before your procedure to ensure your preparation). Free valet parking service is available. You will check in at ADMITTING. The support person will be asked to wait in the waiting room.  It is OK to have someone drop you off and come back when you are ready to be discharged.    Special note: Every effort is made to have your procedure done on time. Please understand that emergencies sometimes delay scheduled procedures.  2. Diet: Do not eat solid foods after midnight.  The patient may have clear liquids until 5am upon the day of the procedure.   3. Medication instructions in preparation for your procedure:  DO NOT take Metolazone or Torsemide the day of your procedure.   Do not take Diabetes Med Marcelline Deist on the day of the procedure and HOLD 48 HOURS AFTER THE PROCEDURE.  On the morning of your procedure, take your Aspirin 81 mg and any morning medicines NOT listed above.  You may use sips of water.  4. Plan to go home  the same day, you will only stay overnight if medically necessary. 5. Bring a current list of your medications and current insurance cards. 6. You MUST have a responsible person to drive you home. 7. Someone MUST be with you the first 24 hours after you arrive home or your discharge will be delayed. 8. Please wear clothes that are easy to get on and off and wear slip-on shoes.  Thank you for allowing [place] to care for you!   -- [place] [facility]

## 2023-02-20 NOTE — Progress Notes (Signed)
MCS EDUCATION NOTE:                VAD evaluation consent reviewed and signed by patient Joe [name] and designated caregiver Joe [name]. Initial VAD teaching completed with pt and caregiver.   VAD educational packet including "Understanding Your Options with Advanced Heart Failure", "Aneta Patient Agreement for VAD Evaluation and Potential Implantation" consent, and Abbott "Heartmate 3 Left Ventricular Device (LVAD) Patient Guide", Heartmate 3 Left Ventricular Assist System Patient Education Program DVD", "Mitchell HM III Patient Education", "[employer] Mechanical Circulatory Support Program", and "Decision Aids for Left Ventricular Assist Device" reviewed in detail and left at bedside for continued reference. All questions answered regarding VAD implant, hospital stay, and what to expect when discharged home living with a heart pump. Pt identified wife Joe [name] as his primary caregiver and will discuss with family for a back-up if this therapy should be deemed appropriate for patient.  Explained need for 24/7 care when pt is discharged home due to sternal precautions, adaptation to living on support, emotional support, consistent and meticulous exit site care and management, medication adherence and high volume of follow up visits with the VAD Clinic after discharge; both pt and caregiver verbalized understanding of above.   Explained that LVAD can be implanted for two indications in the setting of advanced left ventricular heart failure treatment:  Bridge to transplant - used for patients who cannot safely wait for heart transplant without this device.  Or    Destination therapy - used for patients until end of life or recovery of heart function.  Patient and caregiver(s) acknowledge that the indication at this point in time for LVAD therapy would be for destination therapy due to BMI.   Provided brief equipment overview and demonstration with HeartMate III training loop  including discussion on the following:   a) mobile power unit b) system controller   c) universal Magazine features editor   d) battery clips   e) Batteries   f)  Perc lock   g) Percutaneous lead   Demonstrated and discussed:  a) changing power source on system controller from tethered (MPU) to untethered (battery) mode   b) changing power source on system controller from untethered (battery) to tethered (MPU) mode   c) how to monitor battery life both on the system controller and on each individual battery   d) changing batteries   Reviewed and supplied a copy of home inspection check list stressing that only three pronged grounded power outlets can be used for VAD equipment. Patient confirmed home has electrical outlets that will support the equipment along with access working telephone.  Identified the following lifestyle modifications while living on MCS:    1. No driving for at least three months and then only if doctor gives permission to do so.   2. No tub baths while pump implanted, and shower only when doctor gives permission.   3. No swimming or submersion in water while implanted with pump.   4. No contact sports or engaging in jumping activities.   5. Always have a backup controller, charged spare batteries, and battery clips nearby at all times in case of emergency.   6. Call the doctor or hospital contact person if any change in how the pump sounds, feels, or works.   7. Plan to sleep only when connected to the power module.   8. Do not sleep on your stomach.   9. Keep a backup system controller, charged batteries, battery clips,  and flashlight near you during sleep in case of electrical power outage.   10. Exit site care including dressing changes, monitoring for infection, and importance of keeping percutaneous lead stabilized at all times.     Extended the option to have one of our current patients and caregiver(s) come to talk with them about living on support} to assist with  decision making. VAD Coordinator will arranged a time for patient and wife to meet with one of our current VAD patient's.  Reviewed pictures of VAD drive line, site care, dressing changes, and drive line stabilization including securement attachment device and abdominal binder. Discussed with pt and family that they will be required to purchase dressing supplies as long as patient has the VAD in place.   Reinforced need for 24 hour/7 day week caregivers; pt designated wife Joe [name] as his caregiver. He will also need to abide by sternal precautions with no lifting >10lbs, pushing, pulling and will need assistance with adapting to new life style with VAD equipment and care.   Intermacs patient survival statistics through March 2024 reviewed with patient and caregiver as follows:    The patient understands that from this discussion it does not mean that they will receive the device, but that depends on an extensive evaluation process. The patient is aware of the fact that if at anytime they want to stop the evaluation process they can.  All questions have been answered at this time and contact information was provided should they encounter any further questions.  They are both agreeable at this time to the evaluation process and will move forward.   Simmie Davies RN, BSN VAD Coordinator 24/7 Pager [phone]

## 2023-02-20 NOTE — Progress Notes (Signed)
ReDS Vest / Clip - 02/20/23 0900       ReDS Vest / Clip   Station Marker D    Ruler Value 43    ReDS Value Range High volume overload    ReDS Actual Value 43

## 2023-02-20 NOTE — Progress Notes (Signed)
ADVANCED HF CLINIC NOTE  PCP: Lonie Peak, PA-C Primary Cardiologist: Dr. Bing Matter  HF Cardiologist: Dr. Gala Romney  HPI: Joe [name] is a 52 y.o. male with h/o morbid obesity, DM2, HTN, HL, OSA, CAD and systolic HF (EF 20-25%) referred by Dr. Bing Matter for further management of his HF   Has h/o RBBB but never had any other heart problems until 3/19. Saw Dr. Mauri Brooklyn in 3/19 due to CP and SOB. Cath EF 25-35% Severe diabetic CAD with LAD disease (m90 and m75),  RCA d50, LCA m50.   Echo 7/20 EF 20-25% moderate RV dysfunction  Cath January 2020 which showed 40% distal LAD, LAD/LCx stents were patent, mid RCA got 25% lesion right PDA had 50% stenosis the circumflex artery were open.  No intervention has been required.    Had cath 1/22 with stable CAD  Myoview 12/19 EF 30% Apical and infero-apical scar  Had [employer] Jude CRT-D placed 03/19/19 with Dr. Elberta Fortis.  Admitted 9/22 for CP. HsTn was 4. Underwent cardiac cath on 03/07/2021 which showed patent stents in the LAD and LCX with diffuse distal LAD which may be a trigger for angina as well as mildly elevated RA pressure with normal PCWP and LVEDP and preserved cardiac output.   Echo 9/22 EF 35-40%  Admitted 6/23 with CP. Hstrop 35. Felt to be non-cardiac. Echo EF 30-35%  Seen in ED 04/10/22 with SOB, found to be volume overloaded. BNP only 172. Hst trop 37->47. CXR with pulmonary edema. Given IV lasix with > 2L out. Refused hospital admission, had been in [facility] 2-3 x to get fluids back.  He's had recurrent volume overload over the last few months requiring frequent diuretic adjustments. NYHA IIIb/IV.   Seen for follow-up on 08/30. He was markedly volume overloaded. ReDS 47%. Declined admission. Torsemide increased to 80 mg daily and instructed to take 5 mg metolazone daily X 3 days. Coreg stopped. Echo with EF 20-25%.   He is here today for close follow-up and to start workup for advanced therapies. He lost 33 lb over the  weekend. Diuresed 19 lb the first night. Dyspnea, lower extremity edema and orthopnea significantly improved. However, continues to have dyspnea and fatigue with minimal exertion and sometimes at rest. Unable to do grocery shopping or walk even a block without a rest break. He has been limiting sodium intake, reports he does not drink a lot of fluid. Notes some chest pressure with volume overload and/or exertion. This is not as intense as his prior angina.    Has been off [place] for about a month. He misunderstood medication indication and thought he could stop it now that his diabetes is well controlled.   Cardiac Studies - Echo (6/23): EF 30-35%  - Echo (9/22): EF 35-40%  - R/LHC (9/22): patent stents in the LAD and LCX with diffuse distal LAD, which may have been trigger for angina. RA mean 11 PA 36/7 (mean 12) PCWP mean 12 CO/CI (Fick) 6.54/2.33 PVR 1.2 WU  - LHC (1/22): stable CAD  - LHC (1/20): 40% distal LAD, LAD/LCx stents were patent, mid RCA got 25% lesion right PDA had 50% stenosis the circumflex artery were open.  No intervention has been required.    - Echo (7/20): EF 20-25% moderate RV dysfunction  - Cath (3/19): EF 25-35% Severe diabetic CAD with LAD disease (m90 and m75),  RCA d50, LCA m50.   Past Medical History:  Diagnosis Date   Abnormal EKG    Acute systolic heart failure (HCC)  09/06/2017   Angina pectoris (HCC) 09/05/2017   Body mass index 45.0-49.9, adult (HCC)    CAD S/P percutaneous coronary angioplasty 09/07/2017   mLAD and dLAD PCI with DES 09/06/17   CHF (congestive heart failure) (HCC)    Chronic systolic (congestive) heart failure (HCC) 03/19/2019   Complication of anesthesia 1995   "had hard time waking up; breathing w/vasectomy"   Coronary artery disease    Erectile dysfunction    Essential hypertension, benign    Fatigue    GERD (gastroesophageal reflux disease)    Gout    High cholesterol    "just started tx yesterday" (09/06/2017)   History of  gout    "RX prn" (09/06/2017)   Ischemic cardiomyopathy 09/07/2017   EF 25-35%   Muscular chest pain    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   Below Left Nare (MOH's)   Obesity    Obstructive sleep apnea    OSA on CPAP    Plantar fasciitis    Pre-operative clearance 02/11/2018   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/02/2019   [employer] Jude   Right bundle branch block    Shortness of breath 09/05/2017   Testosterone deficiency    Type 2 diabetes mellitus with complication, without long-term current use of insulin (HCC) 09/05/2017   Type II diabetes mellitus (HCC)    "started tx 08/28/2017"   Current Outpatient Medications  Medication Sig Dispense Refill   acetaminophen (TYLENOL) 500 MG tablet Take 1,000 mg by mouth every 6 (six) hours as needed for moderate pain or headache.     allopurinol (ZYLOPRIM) 100 MG tablet Take 100 mg by mouth daily.     aspirin EC 81 MG tablet Take 1 tablet (81 mg total) by mouth daily. 90 tablet 3   colchicine 0.6 MG tablet Take 0.6 mg by mouth daily as needed (Gout).     diclofenac Sodium (VOLTAREN) 1 % GEL APPLY 1 G TOPICALLY DAILY. 100 g 0   escitalopram (LEXAPRO) 10 MG tablet Take 10 mg by mouth every morning.     ezetimibe (ZETIA) 10 MG tablet TAKE 1 TABLET BY MOUTH EVERY DAY 90 tablet 1   Furosemide (FUROSCIX) 80 MG/10ML CTKT Inject 80 mg into the skin as directed. 4 each 0   gabapentin (NEURONTIN) 100 MG capsule Take 100 mg by mouth 2 (two) times daily.     ibuprofen (ADVIL) 200 MG tablet Take 800 mg by mouth every 8 (eight) hours as needed for headache or moderate pain.     indomethacin (INDOCIN) 50 MG capsule Take 50 mg by mouth daily as needed for mild pain or moderate pain (Gout).     isosorbide mononitrate (IMDUR) 30 MG 24 hr tablet TAKE 1 TABLET BY MOUTH EVERY DAY 90 tablet 3   metolazone (ZAROXOLYN) 5 MG tablet Take 1 tablet (5 mg total) by mouth daily. FOR THE NEXT 3 DAYS 3 tablet 0   mexiletine (MEXITIL)  250 MG capsule Take 1 capsule (250 mg total) by mouth 2 (two) times daily. 60 capsule 11   nitroGLYCERIN (NITROSTAT) 0.4 MG SL tablet Place 0.4 mg under the tongue every 5 (five) minutes as needed for chest pain.     omeprazole (PRILOSEC) 40 MG capsule Take 1 capsule (40 mg total) by mouth daily. 90 capsule 1   OZEMPIC, 1 MG/DOSE, 4 MG/3ML SOPN Inject 1 mg into the skin once a week.     Polyethylene Glycol 400 (BLINK TEARS OP) Place 2 drops into both  eyes 4 (four) times daily as needed (for dry eyes).     potassium chloride SA (KLOR-CON M) 20 MEQ tablet Take 1 tablet (20 mEq total) by mouth daily. When you take furoscix please take 40 meq of potassium 36 tablet 6   ranolazine (RANEXA) 500 MG 12 hr tablet Take 1 tablet (500 mg total) by mouth 2 (two) times daily. 180 tablet 2   rosuvastatin (CRESTOR) 40 MG tablet Take 1 tablet (40 mg total) by mouth daily. 90 tablet 2   sacubitril-valsartan (ENTRESTO) 24-26 MG Take 1 tablet by mouth 2 (two) times daily. 60 tablet 2   spironolactone (ALDACTONE) 25 MG tablet Take 1 tablet (25 mg total) by mouth daily. 90 tablet 3   torsemide (DEMADEX) 20 MG tablet Take 2 tablets (40 mg total) by mouth daily. 60 tablet 6   triamcinolone cream (KENALOG) 0.1 % Apply topically 2 (two) times daily.     No current facility-administered medications for this encounter.   No Known Allergies  Social History   Socioeconomic History   Marital status: Married    Spouse name: Not on file   Number of children: Not on file   Years of education: Not on file   Highest education level: Not on file  Occupational History   Not on file  Tobacco Use   Smoking status: Never   Smokeless tobacco: Never  Vaping Use   Vaping status: Never Used  Substance and Sexual Activity   Alcohol use: No   Drug use: Never   Sexual activity: Not on file  Other Topics Concern   Not on file  Social History Narrative   Not on file   Social Determinants of Health   Financial Resource  Strain: Not on file  Food Insecurity: Not on file  Transportation Needs: Not on file  Physical Activity: Not on file  Stress: Not on file  Social Connections: Unknown (10/28/2021)   Received from [facility], [facility]   Social Network    Social Network: Not on file  Intimate Partner Violence: Unknown (09/20/2021)   Received from [employer], [facility]   HITS    Physically Hurt: Not on file    Insult or Talk Down To: Not on file    Threaten Physical Harm: Not on file    Scream or Curse: Not on file   Family History  Problem Relation Age of Onset   Lymphoma Father    Diabetes Mellitus II Brother    Diabetes Mellitus II Paternal Grandfather    Lung cancer Mother    Brain cancer Mother    BP 124/78   Pulse 94   Wt (!) 142.1 kg (313 lb 3.2 oz)   SpO2 95%   BMI 39.15 kg/m   Wt Readings from Last 3 Encounters:  02/20/23 (!) 142.1 kg (313 lb 3.2 oz)  02/15/23 (!) 157.2 kg (346 lb 9.6 oz)  01/16/23 (!) 149.7 kg (330 lb)   PHYSICAL EXAM: General:  Appears fatigued HEENT: normal Neck: supple. JVP 7-8. Carotids 2+ bilat; no bruits.  Cor: PMI nondisplaced. Regular rate & rhythm. No rubs, gallops or murmurs. Lungs: clear Abdomen: obese, soft, nontender, nondistended.  Extremities: no cyanosis, clubbing, rash, 1+ edema, chronic venous stasis changes Neuro: alert & orientedx3. Affect pleasant   ReDs 43%  Device interrogation (personally reviewed): 94% BiV paced, no VT/VF or AF, CorVue trending above threshold X 3 days  ASSESSMENT & PLAN:  1. Chronic Systolic HF - due to iCM - Echo  8/20 EF 20-25%  - Echo 9/22 35-40% - Echo 6/23 EF 30-35% - Echo 02/16/23 EF 20-25%, RV okay (reviewed by Dr. Gala Romney) - Worse over last few months, NYHA IV - Volume improved but remains overloaded. ReDS 43%. Increase Torsemide to 40 mg q am and 20 mg q pm. Increase Kcl to 40 mEq q am and 20 mEq q pm - Restart Farxiga 10 mg daily - Continue Entresto 24/26 mg BID - Continue spiro 25  mg daily - Remain off Coreg - He appears to have end-stage HF. EF worse on echo and has NYHA 4 symptoms. Will start workup for advanced therapies. Arrange somewhat urgent right and left heart catheterization later this week to definitively assess volume status and cardiac output. He met with VAD coordinators today. BMI may prohibit transplant initially, could then consider bridge with LVAD. - Paperwork filled out for parking placard - Labs today  2. CAD with chronic stable angina - s/p multiple stents to LAD and LCX - cath 9/22 stable CAD - Notes intermittent chest pressure, may be d/t volume overload. LHC later this week as above. - Continue ASA, beta blocker and Imdur 30 mg daily. - Continue Ranexa 500 mg bid. - On high-intensity statin - Followed Dr. Kirtland Bouchard.  3. DM2 - A1c 7.2 (1/24) - Reports his PCP recently stopped metformin d/t A1c of 6.4% (report not available) - Continue Farxiga.  4. OSA - Not using CPAP, says machine was recalled a while ago - Follows with Dr. Mayford Knife  5. Obesity - Body mass index is 39.15 kg/m. - has struggled with weight loss - Continue Ozempic  6. Frequent PVCs - On mexilitene 250 mg bid - Zio 5/22 PVC burden 5.4%.  - Followed by Dr. Elberta Fortis - Continue current management  Follow-up: 2 weeks, sooner if needed pending results of right heart catheterization.  FINCH, LINDSAY N, PA-C  8:45 AM  Patient seen and examined with the above-signed Advanced Practice Provider and/or Housestaff. I personally reviewed laboratory data, imaging studies and relevant notes. I independently examined the patient and formulated the important aspects of the plan. I have edited the note to reflect any of my changes or salient points. I have personally discussed the plan with the patient and/or family.  He has lost over 30 pounds with addition of metolazone and increasing loop diuretics.   Breathing better but still NYHA IIIB-IV symptoms. ReDS stil elevated.   General:   Weak appearing. No resp difficulty HEENT: normal Neck: supple. no JVD. Carotids 2+ bilat; no bruits. No lymphadenopathy or thryomegaly appreciated. JXB:[id number] rate & rhythm. +s3 Lungs: clear Abdomen: Obese soft, nontender, nondistended. No hepatosplenomegaly. No bruits or masses. Good bowel sounds. Extremities: no cyanosis, clubbing, rash, tr-1+ edema Neuro: alert & orientedx3, cranial nerves grossly intact. moves all 4 extremities w/o difficulty. Affect pleasant  Volume improved but still with advanced HF symptoms NYHA IIIB-IV. EF now 20-25% Intolerant of many HF meds. He will need w/u for advanced therapies. Given age favor transplant eval but BMI may be prohibitive currently and VAD may be better option.   Will plan R/L cath. VAD team to see today. Med adjustments as above.   Advised him and his wife that he is unable to work at this time.   Total time spent 50 minutes. Over half that time spent discussing above.   Arvilla Meres, MD  12:19 PM

## 2023-02-21 ENCOUNTER — Other Ambulatory Visit (HOSPITAL_COMMUNITY): Payer: Self-pay

## 2023-02-21 DIAGNOSIS — I5022 Chronic systolic (congestive) heart failure: Secondary | ICD-10-CM

## 2023-02-21 DIAGNOSIS — I25118 Atherosclerotic heart disease of native coronary artery with other forms of angina pectoris: Secondary | ICD-10-CM

## 2023-02-21 DIAGNOSIS — E118 Type 2 diabetes mellitus with unspecified complications: Secondary | ICD-10-CM

## 2023-02-22 ENCOUNTER — Ambulatory Visit: Payer: 59 | Attending: Cardiology | Admitting: Cardiology

## 2023-02-22 ENCOUNTER — Encounter: Payer: Self-pay | Admitting: Cardiology

## 2023-02-22 VITALS — BP 110/80 | HR 101 | Ht 75.0 in | Wt 317.6 lb

## 2023-02-22 DIAGNOSIS — I5022 Chronic systolic (congestive) heart failure: Secondary | ICD-10-CM | POA: Diagnosis not present

## 2023-02-22 DIAGNOSIS — I255 Ischemic cardiomyopathy: Secondary | ICD-10-CM

## 2023-02-22 DIAGNOSIS — I2510 Atherosclerotic heart disease of native coronary artery without angina pectoris: Secondary | ICD-10-CM

## 2023-02-22 DIAGNOSIS — E118 Type 2 diabetes mellitus with unspecified complications: Secondary | ICD-10-CM

## 2023-02-22 DIAGNOSIS — G4733 Obstructive sleep apnea (adult) (pediatric): Secondary | ICD-10-CM

## 2023-02-22 DIAGNOSIS — Z9861 Coronary angioplasty status: Secondary | ICD-10-CM

## 2023-02-22 NOTE — Progress Notes (Signed)
Cardiology Office Note:    Date:  02/22/2023   ID:  Gay Filler, DOB February 06, 1971, MRN [id number]  PCP:  Lonie Peak, PA-C  Cardiologist:  [name], MD    Referring MD: Lonie Peak, PA-C   Chief Complaint  Patient presents with   Follow-up    History of Present Illness:    Joe [name] is a 52 y.o. male complex past medical history.  Coronary artery disease status post PTCA and stenting of the mid and distal LAD done in March 2019 after that he recovered quite nicely but then in January 2021 he required another cardiac catheterization which showed 40% stenosis in the distal LAD stent were patent.  Mild mid RCA had 25% stenosis the PDA 50% stenosis complex was normal at that time he was noted to have severely diminished left ventricle ejection fraction 2025%.  He was put on guideline directed medical therapy, CRT-D has being implanted in March 19, 2019.  His ejection fraction improved to 35 to 40%.  In June 20, 2020 he required another cardiac catheterization for atypical chest pain however no target lesion was identified for intervention.  Recently started deteriorating he started retaining fluid started having more than shortness of breath.  He was follow-up by our advanced congestive heart failure clinic.  He was aggressively diuresed with good response.  He feels significantly better leg swelling is significantly improved.  He can lay flat and sleep flat.  He also lost some weight by eating less.  He told me that he does not have much appetite.  He is scheduled to have cardiac catheterization tomorrow to check his coronary arteries as well as right side cardiac catheterization.  Past Medical History:  Diagnosis Date   Abnormal EKG    Acute systolic heart failure (HCC) 09/06/2017   Angina pectoris (HCC) 09/05/2017   Body mass index 45.0-49.9, adult (HCC)    CAD S/P percutaneous coronary angioplasty 09/07/2017   mLAD and dLAD PCI with DES 09/06/17   CHF  (congestive heart failure) (HCC)    Chronic systolic (congestive) heart failure (HCC) 03/19/2019   Complication of anesthesia 1995   "had hard time waking up; breathing w/vasectomy"   Coronary artery disease    Erectile dysfunction    Essential hypertension, benign    Fatigue    GERD (gastroesophageal reflux disease)    Gout    High cholesterol    "just started tx yesterday" (09/06/2017)   History of gout    "RX prn" (09/06/2017)   Ischemic cardiomyopathy 09/07/2017   EF 25-35%   Muscular chest pain    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   Below Left Nare (MOH's)   Obesity    Obstructive sleep apnea    OSA on CPAP    Plantar fasciitis    Pre-operative clearance 02/11/2018   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/02/2019   [employer]   Right bundle branch block    Shortness of breath 09/05/2017   Testosterone deficiency    Type 2 diabetes mellitus with complication, without long-term current use of insulin (HCC) 09/05/2017   Type II diabetes mellitus (HCC)    "started tx 08/28/2017"    Past Surgical History:  Procedure Laterality Date   BACK SURGERY     BIV ICD INSERTION CRT-D N/A 03/19/2019   Procedure: BIV ICD INSERTION CRT-D;  Surgeon: Regan Lemming, MD;  Location: [facility] INVASIVE CV LAB;  Service: Cardiovascular;  Laterality: N/A;   CORONARY ANGIOPLASTY WITH STENT  PLACEMENT  09/06/2017   "3 stents"   CORONARY STENT INTERVENTION N/A 09/06/2017   Procedure: CORONARY STENT INTERVENTION;  Surgeon: Corky Crafts, MD;  Location: [facility] INVASIVE CV LAB;  Service: Cardiovascular;  Laterality: N/A;   CORONARY STENT INTERVENTION N/A 02/15/2018   Procedure: CORONARY STENT INTERVENTION;  Surgeon: Corky Crafts, MD;  Location: [facility] INVASIVE CV LAB;  Service: Cardiovascular;  Laterality: N/A;   KNEE ARTHROSCOPY Right    LEFT HEART CATH AND CORONARY ANGIOGRAPHY N/A 09/06/2017   Procedure: LEFT HEART CATH AND CORONARY ANGIOGRAPHY;  Surgeon:  Corky Crafts, MD;  Location: [facility] INVASIVE CV LAB;  Service: Cardiovascular;  Laterality: N/A;   LEFT HEART CATH AND CORONARY ANGIOGRAPHY N/A 02/15/2018   Procedure: LEFT HEART CATH AND CORONARY ANGIOGRAPHY;  Surgeon: Corky Crafts, MD;  Location: [facility] INVASIVE CV LAB;  Service: Cardiovascular;  Laterality: N/A;   LEFT HEART CATH AND CORONARY ANGIOGRAPHY N/A 06/27/2018   Procedure: LEFT HEART CATH AND CORONARY ANGIOGRAPHY;  Surgeon: Corky Crafts, MD;  Location: [facility] INVASIVE CV LAB;  Service: Cardiovascular;  Laterality: N/A;   LEFT HEART CATH AND CORONARY ANGIOGRAPHY N/A 06/28/2020   Procedure: LEFT HEART CATH AND CORONARY ANGIOGRAPHY;  Surgeon: [place], Peter M, MD;  Location: [facility] INVASIVE CV LAB;  Service: Cardiovascular;  Laterality: N/A;   LUMBAR SPINE SURGERY  ~ 2001   Intradiscal Electrothermoplasty   RIGHT/LEFT HEART CATH AND CORONARY ANGIOGRAPHY N/A 03/07/2021   Procedure: RIGHT/LEFT HEART CATH AND CORONARY ANGIOGRAPHY;  Surgeon: Laurey Morale, MD;  Location: [facility] INVASIVE CV LAB;  Service: Cardiovascular;  Laterality: N/A;   VASECTOMY  1995    Current Medications: Current Meds  Medication Sig   acetaminophen (TYLENOL) 500 MG tablet Take 1,000 mg by mouth every 6 (six) hours as needed for moderate pain or headache.   allopurinol (ZYLOPRIM) 100 MG tablet Take 100 mg by mouth daily.   Aromatic Inhalants (VICKS VAPOINHALER) INHA Inhale 1 puff into the lungs daily as needed (congestion).   aspirin EC 81 MG tablet Take 1 tablet (81 mg total) by mouth daily.   colchicine 0.6 MG tablet Take 0.6 mg by mouth daily as needed (Gout).   dapagliflozin propanediol (FARXIGA) 10 MG TABS tablet Take 1 tablet (10 mg total) by mouth daily.   diclofenac Sodium (VOLTAREN) 1 % GEL APPLY 1 G TOPICALLY DAILY.   escitalopram (LEXAPRO) 10 MG tablet Take 10 mg by mouth every morning.   Furosemide 80 MG/10ML CTKT Inject 80 mg into the skin daily as needed (severe swelling).   gabapentin (NEURONTIN)  100 MG capsule Take 100 mg by mouth 2 (two) times daily.   ibuprofen (ADVIL) 200 MG tablet Take 800 mg by mouth every 8 (eight) hours as needed for headache or moderate pain.   isosorbide mononitrate (IMDUR) 30 MG 24 hr tablet TAKE 1 TABLET BY MOUTH EVERY DAY   mexiletine (MEXITIL) 250 MG capsule Take 1 capsule (250 mg total) by mouth 2 (two) times daily.   nitroGLYCERIN (NITROSTAT) 0.4 MG SL tablet Place 0.4 mg under the tongue every 5 (five) minutes as needed for chest pain.   omeprazole (PRILOSEC) 40 MG capsule Take 1 capsule (40 mg total) by mouth daily.   OZEMPIC, 1 MG/DOSE, 4 MG/3ML SOPN Inject 1 mg into the skin every Monday.   Polyethylene Glycol 400 (BLINK TEARS OP) Place 2 drops into both eyes 4 (four) times daily as needed (for dry eyes).   potassium chloride SA (KLOR-CON M) 20 MEQ tablet Take 2  tablets (40 mEq total) by mouth every morning AND 1 tablet (20 mEq total) every evening. When you take furoscix please take 40 meq of potassium. (Patient taking differently: Take 2 tablets (40 mEq total) by mouth every morning AND 1 tablet (20 mEq total) every evening. When you take furosemide please take 40 meq of potassium.)   ranolazine (RANEXA) 500 MG 12 hr tablet Take 1 tablet (500 mg total) by mouth 2 (two) times daily.   rosuvastatin (CRESTOR) 40 MG tablet Take 1 tablet (40 mg total) by mouth daily.   sacubitril-valsartan (ENTRESTO) 24-26 MG Take 1 tablet by mouth 2 (two) times daily.   spironolactone (ALDACTONE) 25 MG tablet Take 1 tablet (25 mg total) by mouth daily.   torsemide (DEMADEX) 20 MG tablet Take 2 tablets (40 mg total) by mouth every morning AND 1 tablet (20 mg total) every evening.   triamcinolone cream (KENALOG) 0.1 % Apply 1 Application topically 2 (two) times daily.   [DISCONTINUED] ezetimibe (ZETIA) 10 MG tablet TAKE 1 TABLET BY MOUTH EVERY DAY (Patient taking differently: Take 10 mg by mouth daily.)   [DISCONTINUED] indomethacin (INDOCIN) 50 MG capsule Take 50 mg by mouth  daily as needed for mild pain or moderate pain (Gout).   [DISCONTINUED] metolazone (ZAROXOLYN) 5 MG tablet Take 1 tablet (5 mg total) by mouth daily. FOR THE NEXT 3 DAYS (Patient not taking: Reported on 02/22/2023)     Allergies:   Patient has no known allergies.   Social History   Socioeconomic History   Marital status: Married    Spouse name: Not on file   Number of children: Not on file   Years of education: Not on file   Highest education level: Not on file  Occupational History   Not on file  Tobacco Use   Smoking status: Never   Smokeless tobacco: Never  Vaping Use   Vaping status: Never Used  Substance and Sexual Activity   Alcohol use: No   Drug use: Never   Sexual activity: Not on file  Other Topics Concern   Not on file  Social History Narrative   Not on file   Social Determinants of Health   Financial Resource Strain: Not on file  Food Insecurity: Not on file  Transportation Needs: Not on file  Physical Activity: Not on file  Stress: Not on file  Social Connections: Unknown (10/28/2021)   Received from [facility]   Social Network    Social Network: Not on file     Family History: The patient's family history includes Brain cancer in his mother; Diabetes Mellitus II in his brother and paternal grandfather; Lung cancer in his mother; Lymphoma in his father. ROS:   Please see the history of present illness.    All 14 point review of systems negative except as described per history of present illness  EKGs/Labs/Other Studies Reviewed:         Recent Labs: 08/10/2022: NT-Pro BNP 351; TSH 2.910 11/20/2022: Magnesium 2.3 02/20/2023: ALT 21; B Natriuretic Peptide 321.7; BUN 14; Creatinine, Ser 1.01; Hemoglobin 16.0; Platelets 224; Potassium 3.0; Sodium 136  Recent Lipid Panel    Component Value Date/Time   CHOL 172 11/28/2021 0030   CHOL 103 03/21/2021 0923   TRIG 244 (H) 11/28/2021 0030   HDL 27 (L) 11/28/2021 0030   HDL 31 (L) 03/21/2021  0923   CHOLHDL 6.4 11/28/2021 0030   VLDL 49 (H) 11/28/2021 0030   LDLCALC 96 11/28/2021 0030  LDLCALC 34 03/21/2021 0923    Physical Exam:    VS:  BP 110/80 (BP Location: Left Arm, Patient Position: Sitting)   Pulse (!) 101   Ht 6\' 3"  (1.905 m)   Wt (!) 317 lb 9.6 oz (144.1 kg)   SpO2 95%   BMI 39.70 kg/m     Wt Readings from Last 3 Encounters:  02/22/23 (!) 317 lb 9.6 oz (144.1 kg)  02/20/23 (!) 313 lb 3.2 oz (142.1 kg)  02/15/23 (!) 346 lb 9.6 oz (157.2 kg)     GEN:  Well nourished, well developed in no acute distress, looks skinnier at this time appears to be chronically sick.  Not in any distress HEENT: Normal NECK: JVD; No carotid bruits LYMPHATICS: No lymphadenopathy CARDIAC: RRR, no murmurs, no rubs, no gallops RESPIRATORY:  Clear to auscultation without rales, wheezing or rhonchi  ABDOMEN: Soft, minimally tender in right upper quadrant, non-distended MUSCULOSKELETAL:  No edema; No deformity  SKIN: Warm and dry LOWER EXTREMITIES: 1+ NEUROLOGIC:  Alert and oriented x 3 PSYCHIATRIC:  Normal affect   ASSESSMENT:    1. CAD S/P percutaneous coronary angioplasty   2. Chronic systolic (congestive) heart failure (HCC)   3. Ischemic cardiomyopathy   4. OSA on CPAP   5. Type 2 diabetes mellitus with complication, without long-term current use of insulin (HCC)    PLAN:    In order of problems listed above:  Coronary artery disease with multiple cardiac catheterization multiple interventions.  She is scheduled to have cardiac catheterization tomorrow to rule out obstructive disease as potential explanation for worsening of his cardiomyopathy. Ischemic cardiomyopathy with deterioration of left ventricle ejection fraction now 2025%.  He is again as directed medical therapy.  Conversation initiated about potential LVAD as well as transplant. Obstructive sleep apnea: Use CPAP mask on the regular basis. Diabetes: Last hemoglobin A1c is from 01/12/2023 6.4%.  Will continue  monitoring. PVCs.  He is follow-up by EP team, he does have ICD CRT device by [facility], 2.9 at least years left in the device.  Interrogation of the device reviewed, no therapy delivered.   Medication Adjustments/Labs and Tests Ordered: Current medicines are reviewed at length with the patient today.  Concerns regarding medicines are outlined above.  No orders of the defined types were placed in this encounter.  Medication changes: No orders of the defined types were placed in this encounter.   Signed, Georgeanna Lea, MD, [facility] 02/22/2023 4:26 PM    [facility]

## 2023-02-22 NOTE — Telephone Encounter (Signed)
Ok to change to [place] not covered by [employer]

## 2023-02-22 NOTE — Patient Instructions (Signed)
Medication Instructions:  ?Your physician recommends that you continue on your current medications as directed. Please refer to the Current Medication list given to you today.  ?*If you need a refill on your cardiac medications before your next appointment, please call your pharmacy* ? ? ?Lab Work: ?None Ordered ?If you have labs (blood work) drawn today and your tests are completely normal, you will receive your results only by: ?MyChart Message (if you have MyChart) OR ?A paper copy in the mail ?If you have any lab test that is abnormal or we need to change your treatment, we will call you to review the results. ? ? ?Testing/Procedures: ?None Ordered ? ? ?Follow-Up: ?At [facility], you and your health needs are our priority.  As part of our continuing mission to provide you with exceptional heart care, we have created designated Provider Care Teams.  These Care Teams include your primary Cardiologist (physician) and Advanced Practice Providers (APPs -  Physician Assistants and Nurse Practitioners) who all work together to provide you with the care you need, when you need it. ? ?We recommend signing up for the patient portal called "MyChart".  Sign up information is provided on this After Visit Summary.  MyChart is used to connect with patients for Virtual Visits (Telemedicine).  Patients are able to view lab/test results, encounter notes, upcoming appointments, etc.  Non-urgent messages can be sent to your provider as well.   ?To learn more about what you can do with MyChart, go to [email or web address].   ? ?Your next appointment:   ?3 month(s) ? ?The format for your next appointment:   ?In Person ? ?Provider:   ?Robert Krasowski, MD  ? ? ?Other Instructions ?NA  ?

## 2023-02-23 ENCOUNTER — Other Ambulatory Visit (HOSPITAL_COMMUNITY): Payer: Self-pay | Admitting: Internal Medicine

## 2023-02-23 ENCOUNTER — Ambulatory Visit (HOSPITAL_COMMUNITY)
Admission: RE | Admit: 2023-02-23 | Discharge: 2023-02-23 | Disposition: A | Discharge status: Home / Self Care | Payer: 59 | Attending: Internal Medicine | Admitting: Internal Medicine

## 2023-02-23 ENCOUNTER — Encounter (HOSPITAL_COMMUNITY)
Admission: RE | Disposition: A | Discharge status: Home / Self Care | Payer: Self-pay | Source: Home / Self Care | Attending: Internal Medicine

## 2023-02-23 ENCOUNTER — Other Ambulatory Visit (HOSPITAL_COMMUNITY): Payer: Self-pay

## 2023-02-23 ENCOUNTER — Other Ambulatory Visit: Payer: Self-pay

## 2023-02-23 DIAGNOSIS — Z7985 Long-term (current) use of injectable non-insulin antidiabetic drugs: Secondary | ICD-10-CM | POA: Insufficient documentation

## 2023-02-23 DIAGNOSIS — Z6841 Body Mass Index (BMI) 40.0 and over, adult: Secondary | ICD-10-CM | POA: Diagnosis not present

## 2023-02-23 DIAGNOSIS — I25118 Atherosclerotic heart disease of native coronary artery with other forms of angina pectoris: Secondary | ICD-10-CM | POA: Diagnosis not present

## 2023-02-23 DIAGNOSIS — G4733 Obstructive sleep apnea (adult) (pediatric): Secondary | ICD-10-CM | POA: Diagnosis not present

## 2023-02-23 DIAGNOSIS — I110 Hypertensive heart disease with heart failure: Secondary | ICD-10-CM | POA: Insufficient documentation

## 2023-02-23 DIAGNOSIS — Z95810 Presence of automatic (implantable) cardiac defibrillator: Secondary | ICD-10-CM | POA: Diagnosis not present

## 2023-02-23 DIAGNOSIS — Z79899 Other long term (current) drug therapy: Secondary | ICD-10-CM | POA: Insufficient documentation

## 2023-02-23 DIAGNOSIS — I5022 Chronic systolic (congestive) heart failure: Secondary | ICD-10-CM | POA: Diagnosis not present

## 2023-02-23 DIAGNOSIS — I2510 Atherosclerotic heart disease of native coronary artery without angina pectoris: Secondary | ICD-10-CM

## 2023-02-23 DIAGNOSIS — Z955 Presence of coronary angioplasty implant and graft: Secondary | ICD-10-CM | POA: Insufficient documentation

## 2023-02-23 DIAGNOSIS — I493 Ventricular premature depolarization: Secondary | ICD-10-CM | POA: Diagnosis not present

## 2023-02-23 DIAGNOSIS — E119 Type 2 diabetes mellitus without complications: Secondary | ICD-10-CM | POA: Insufficient documentation

## 2023-02-23 DIAGNOSIS — Z7984 Long term (current) use of oral hypoglycemic drugs: Secondary | ICD-10-CM | POA: Insufficient documentation

## 2023-02-23 HISTORY — PX: RIGHT/LEFT HEART CATH AND CORONARY ANGIOGRAPHY: CATH118266

## 2023-02-23 LAB — BASIC METABOLIC PANEL
Anion gap: 15 (ref 5–15)
BUN: 19 mg/dL (ref 6–20)
CO2: 32 mmol/L (ref 22–32)
Calcium: 9.3 mg/dL (ref 8.9–10.3)
Chloride: 93 mmol/L — ABNORMAL LOW (ref 98–111)
Creatinine, Ser: 1.05 mg/dL (ref 0.61–1.24)
GFR, Estimated: >60 mL/min (ref >60)
Glucose, Bld: 133 mg/dL — ABNORMAL HIGH (ref 70–99)
Potassium: 4.6 mmol/L (ref 3.5–5.1)
Sodium: 140 mmol/L (ref 135–145)

## 2023-02-23 LAB — GLUCOSE, CAPILLARY: Glucose-Capillary: 108 mg/dL — ABNORMAL HIGH (ref 70–99)

## 2023-02-23 LAB — POCT I-STAT 7, (LYTES, BLD GAS, ICA,H+H)
Acid-Base Excess: 5 mmol/L — ABNORMAL HIGH (ref 0.0–2.0)
Acid-Base Excess: 7 mmol/L — ABNORMAL HIGH (ref 0.0–2.0)
Bicarbonate: 31.5 mmol/L — ABNORMAL HIGH (ref 20.0–28.0)
Bicarbonate: 33.6 mmol/L — ABNORMAL HIGH (ref 20.0–28.0)
Calcium, Ion: 1.12 mmol/L — ABNORMAL LOW (ref 1.15–1.40)
Calcium, Ion: 1.14 mmol/L — ABNORMAL LOW (ref 1.15–1.40)
HCT: 46 % (ref 39.0–52.0)
HCT: 47 % (ref 39.0–52.0)
Hemoglobin: 15.6 g/dL (ref 13.0–17.0)
Hemoglobin: 16 g/dL (ref 13.0–17.0)
O2 Saturation: 65 %
O2 Saturation: 92 %
Potassium: 2.7 mmol/L — CL (ref 3.5–5.1)
Potassium: 2.7 mmol/L — CL (ref 3.5–5.1)
Sodium: 138 mmol/L (ref 135–145)
Sodium: 139 mmol/L (ref 135–145)
TCO2: 33 mmol/L — ABNORMAL HIGH (ref 22–32)
TCO2: 35 mmol/L — ABNORMAL HIGH (ref 22–32)
pCO2 arterial: 50.3 mmHg — ABNORMAL HIGH (ref 32–48)
pCO2 arterial: 52.3 mmHg — ABNORMAL HIGH (ref 32–48)
pH, Arterial: 7.406 (ref 7.35–7.45)
pH, Arterial: 7.416 (ref 7.35–7.45)
pO2, Arterial: 34 mmHg — CL (ref 83–108)
pO2, Arterial: 64 mmHg — ABNORMAL LOW (ref 83–108)

## 2023-02-23 SURGERY — RIGHT/LEFT HEART CATH AND CORONARY ANGIOGRAPHY
Anesthesia: LOCAL

## 2023-02-23 MED ORDER — POTASSIUM CHLORIDE CRYS ER 20 MEQ PO TBCR: EXTENDED_RELEASE_TABLET | ORAL | 5 refills | Status: DC

## 2023-02-23 MED ORDER — IOHEXOL 350 MG/ML SOLN
INTRAVENOUS | Status: DC | PRN
  Administered 2023-02-23: 40 mL

## 2023-02-23 MED ORDER — MIDAZOLAM HCL 2 MG/2ML IJ SOLN
INTRAMUSCULAR | Status: DC | PRN
  Administered 2023-02-23 (×2): 1 mg via INTRAVENOUS

## 2023-02-23 MED ORDER — ACETAMINOPHEN 325 MG PO TABS: 650.0000 mg | ORAL_TABLET | ORAL | Status: DC | PRN

## 2023-02-23 MED ORDER — FENTANYL CITRATE (PF) 100 MCG/2ML IJ SOLN
INTRAMUSCULAR | Status: DC | PRN
  Administered 2023-02-23 (×2): 25 ug via INTRAVENOUS

## 2023-02-23 MED ORDER — LIDOCAINE HCL (PF) 1 % IJ SOLN
INTRAMUSCULAR | Status: AC
  Filled 2023-02-23: qty 30

## 2023-02-23 MED ORDER — HEPARIN SODIUM (PORCINE) 1000 UNIT/ML IJ SOLN
INTRAMUSCULAR | Status: DC | PRN
  Administered 2023-02-23: 6000 [IU] via INTRAVENOUS

## 2023-02-23 MED ORDER — HEPARIN (PORCINE) IN NACL 1000-0.9 UT/500ML-% IV SOLN
INTRAVENOUS | Status: DC | PRN
  Administered 2023-02-23 (×2): 500 mL

## 2023-02-23 MED ORDER — SODIUM CHLORIDE 0.9 % IV SOLN: 250.0000 mL | INTRAVENOUS | Status: DC | PRN

## 2023-02-23 MED ORDER — SODIUM CHLORIDE 0.9% FLUSH: 3.0000 mL | INTRAVENOUS | Status: DC | PRN

## 2023-02-23 MED ORDER — SODIUM CHLORIDE 0.9% FLUSH: 3.0000 mL | Freq: Two times a day (BID) | INTRAVENOUS | Status: DC

## 2023-02-23 MED ORDER — SODIUM CHLORIDE 0.9 % IV SOLN: INTRAVENOUS | Status: DC

## 2023-02-23 MED ORDER — TORSEMIDE 20 MG PO TABS: ORAL_TABLET | ORAL | 5 refills | Status: DC

## 2023-02-23 MED ORDER — ONDANSETRON HCL 4 MG/2ML IJ SOLN: 4.0000 mg | Freq: Four times a day (QID) | INTRAMUSCULAR | Status: DC | PRN

## 2023-02-23 MED ORDER — HEPARIN SODIUM (PORCINE) 1000 UNIT/ML IJ SOLN
INTRAMUSCULAR | Status: AC
  Filled 2023-02-23: qty 10

## 2023-02-23 MED ORDER — ASPIRIN 81 MG PO CHEW: 81.0000 mg | CHEWABLE_TABLET | ORAL | Status: DC

## 2023-02-23 MED ORDER — METOLAZONE 2.5 MG PO TABS: 2.5000 mg | ORAL_TABLET | Freq: Every day | ORAL | 3 refills | Status: DC

## 2023-02-23 MED ORDER — VERAPAMIL HCL 2.5 MG/ML IV SOLN
INTRAVENOUS | Status: DC | PRN
  Administered 2023-02-23: 10 mL via INTRA_ARTERIAL

## 2023-02-23 MED ORDER — ONDANSETRON HCL 4 MG/2ML IJ SOLN
INTRAMUSCULAR | Status: AC
  Filled 2023-02-23: qty 2

## 2023-02-23 MED ORDER — HYDRALAZINE HCL 20 MG/ML IJ SOLN: 10.0000 mg | INTRAMUSCULAR | Status: DC | PRN

## 2023-02-23 MED ORDER — VERAPAMIL HCL 2.5 MG/ML IV SOLN
INTRAVENOUS | Status: AC
  Filled 2023-02-23: qty 2

## 2023-02-23 MED ORDER — LIDOCAINE HCL (PF) 1 % IJ SOLN
INTRAMUSCULAR | Status: DC | PRN
  Administered 2023-02-23: 2 mL
  Administered 2023-02-23: 1 mL

## 2023-02-23 MED ORDER — ONDANSETRON HCL 4 MG/2ML IJ SOLN
INTRAMUSCULAR | Status: DC | PRN
  Administered 2023-02-23: 4 mg via INTRAVENOUS

## 2023-02-23 MED ORDER — FENTANYL CITRATE (PF) 100 MCG/2ML IJ SOLN
INTRAMUSCULAR | Status: AC
  Filled 2023-02-23: qty 2

## 2023-02-23 MED ORDER — MIDAZOLAM HCL 2 MG/2ML IJ SOLN
INTRAMUSCULAR | Status: AC
  Filled 2023-02-23: qty 2

## 2023-02-23 SURGICAL SUPPLY — 11 items
CATH 5FR JL3.5 JR4 ANG PIG MP (CATHETERS) IMPLANT
CATH SWAN GANZ 7F STRAIGHT (CATHETERS) IMPLANT
DEVICE RAD COMP TR BAND LRG (VASCULAR PRODUCTS) IMPLANT
GLIDESHEATH SLEND SS 6F .021 (SHEATH) IMPLANT
GLIDESHEATH SLENDER 7FR .021G (SHEATH) IMPLANT
GUIDEWIRE INQWIRE 1.5J.035X260 (WIRE) IMPLANT
INQWIRE 1.5J .035X260CM (WIRE) ×1
KIT SYRINGE INJ CVI SPIKEX1 (MISCELLANEOUS) IMPLANT
PACK CARDIAC CATHETERIZATION (CUSTOM PROCEDURE TRAY) ×1 IMPLANT
SET ATX-X65L (MISCELLANEOUS) IMPLANT
SHEATH PROBE COVER 6X72 (BAG) IMPLANT

## 2023-02-23 NOTE — Progress Notes (Signed)
TR BAND REMOVAL  LOCATION:    right radial  DEFLATED PER PROTOCOL:    Yes.    TIME BAND OFF / DRESSING APPLIED:    1720 gauze dressing applied   SITE UPON ARRIVAL:    Level 0  SITE AFTER BAND REMOVAL:    Level 0  CIRCULATION SENSATION AND MOVEMENT:    Within Normal Limits   Yes.    COMMENTS:   no issues noted

## 2023-02-23 NOTE — Interval H&P Note (Signed)
History and Physical Interval Note:  02/23/2023 4:27 PM  Joe [name]  has presented today for surgery, with the diagnosis of heart failure.  The various methods of treatment have been discussed with the patient and family. After consideration of risks, benefits and other options for treatment, the patient has consented to  Procedure(s): RIGHT/LEFT HEART CATH AND CORONARY ANGIOGRAPHY (N/A) as a surgical intervention.  The patient's history has been reviewed, patient examined, no change in status, stable for surgery.  I have reviewed the patient's chart and labs.  Questions were answered to the patient's satisfaction.     [name]

## 2023-02-23 NOTE — Discharge Instructions (Signed)

## 2023-02-25 LAB — POCT I-STAT 7, (LYTES, BLD GAS, ICA,H+H)
Acid-Base Excess: 7 mmol/L — ABNORMAL HIGH (ref 0.0–2.0)
Bicarbonate: 33.4 mmol/L — ABNORMAL HIGH (ref 20.0–28.0)
Calcium, Ion: 1.13 mmol/L — ABNORMAL LOW (ref 1.15–1.40)
HCT: 47 % (ref 39.0–52.0)
Hemoglobin: 16 g/dL (ref 13.0–17.0)
O2 Saturation: 62 %
Potassium: 2.8 mmol/L — ABNORMAL LOW (ref 3.5–5.1)
Sodium: 140 mmol/L (ref 135–145)
TCO2: 35 mmol/L — ABNORMAL HIGH (ref 22–32)
pCO2 arterial: 53.1 mmHg — ABNORMAL HIGH (ref 32–48)
pH, Arterial: 7.406 (ref 7.35–7.45)
pO2, Arterial: 33 mmHg — CL (ref 83–108)

## 2023-02-26 ENCOUNTER — Ambulatory Visit (HOSPITAL_COMMUNITY)
Admission: RE | Admit: 2023-02-26 | Discharge: 2023-02-26 | Disposition: A | Discharge status: Home / Self Care | Payer: 59 | Source: Ambulatory Visit | Attending: Internal Medicine | Admitting: Internal Medicine

## 2023-02-26 ENCOUNTER — Encounter (HOSPITAL_COMMUNITY): Payer: Self-pay | Admitting: Internal Medicine

## 2023-02-26 ENCOUNTER — Other Ambulatory Visit (HOSPITAL_COMMUNITY): Payer: Self-pay

## 2023-02-26 DIAGNOSIS — I5022 Chronic systolic (congestive) heart failure: Secondary | ICD-10-CM

## 2023-02-26 DIAGNOSIS — I25118 Atherosclerotic heart disease of native coronary artery with other forms of angina pectoris: Secondary | ICD-10-CM

## 2023-02-26 DIAGNOSIS — E118 Type 2 diabetes mellitus with unspecified complications: Secondary | ICD-10-CM | POA: Insufficient documentation

## 2023-03-01 ENCOUNTER — Encounter (HOSPITAL_COMMUNITY): Payer: Self-pay

## 2023-03-01 ENCOUNTER — Encounter: Payer: Self-pay | Admitting: Internal Medicine

## 2023-03-02 NOTE — Telephone Encounter (Signed)
Bruising is common post cath. Should improve gradually. Would call if develops severe pain and/or swelling over the next few days.

## 2023-03-05 ENCOUNTER — Telehealth (HOSPITAL_COMMUNITY): Payer: Self-pay

## 2023-03-05 NOTE — Telephone Encounter (Signed)
Patient called about CT results. Please advise.

## 2023-03-07 NOTE — Telephone Encounter (Signed)
Patient advised and verbalized understanding

## 2023-03-09 ENCOUNTER — Ambulatory Visit (HOSPITAL_COMMUNITY)
Admission: RE | Admit: 2023-03-09 | Discharge: 2023-03-09 | Disposition: A | Discharge status: Home / Self Care | Payer: 59 | Source: Ambulatory Visit | Attending: Family Medicine | Admitting: Family Medicine

## 2023-03-09 ENCOUNTER — Encounter (HOSPITAL_COMMUNITY): Payer: Self-pay

## 2023-03-09 ENCOUNTER — Other Ambulatory Visit (HOSPITAL_COMMUNITY): Payer: Self-pay

## 2023-03-09 VITALS — BP 108/78 | HR 106 | Wt 310.0 lb

## 2023-03-09 DIAGNOSIS — I5022 Chronic systolic (congestive) heart failure: Secondary | ICD-10-CM | POA: Diagnosis present

## 2023-03-09 DIAGNOSIS — I493 Ventricular premature depolarization: Secondary | ICD-10-CM | POA: Diagnosis not present

## 2023-03-09 DIAGNOSIS — I25118 Atherosclerotic heart disease of native coronary artery with other forms of angina pectoris: Secondary | ICD-10-CM | POA: Insufficient documentation

## 2023-03-09 DIAGNOSIS — Z6838 Body mass index (BMI) 38.0-38.9, adult: Secondary | ICD-10-CM | POA: Diagnosis not present

## 2023-03-09 DIAGNOSIS — E785 Hyperlipidemia, unspecified: Secondary | ICD-10-CM | POA: Insufficient documentation

## 2023-03-09 DIAGNOSIS — I110 Hypertensive heart disease with heart failure: Secondary | ICD-10-CM | POA: Insufficient documentation

## 2023-03-09 DIAGNOSIS — R9431 Abnormal electrocardiogram [ECG] [EKG]: Secondary | ICD-10-CM | POA: Diagnosis not present

## 2023-03-09 DIAGNOSIS — I255 Ischemic cardiomyopathy: Secondary | ICD-10-CM | POA: Diagnosis not present

## 2023-03-09 DIAGNOSIS — G4733 Obstructive sleep apnea (adult) (pediatric): Secondary | ICD-10-CM | POA: Diagnosis not present

## 2023-03-09 DIAGNOSIS — E118 Type 2 diabetes mellitus with unspecified complications: Secondary | ICD-10-CM

## 2023-03-09 DIAGNOSIS — E119 Type 2 diabetes mellitus without complications: Secondary | ICD-10-CM | POA: Diagnosis not present

## 2023-03-09 LAB — BASIC METABOLIC PANEL
Anion gap: 12 (ref 5–15)
BUN: 12 mg/dL (ref 6–20)
CO2: 28 mmol/L (ref 22–32)
Calcium: 8.7 mg/dL — ABNORMAL LOW (ref 8.9–10.3)
Chloride: 97 mmol/L — ABNORMAL LOW (ref 98–111)
Creatinine, Ser: 1.32 mg/dL — ABNORMAL HIGH (ref 0.61–1.24)
GFR, Estimated: >60 mL/min (ref >60)
Glucose, Bld: 182 mg/dL — ABNORMAL HIGH (ref 70–99)
Potassium: 3.5 mmol/L (ref 3.5–5.1)
Sodium: 137 mmol/L (ref 135–145)

## 2023-03-09 LAB — BRAIN NATRIURETIC PEPTIDE: B Natriuretic Peptide: 221.8 pg/mL — ABNORMAL HIGH (ref 0.0–100.0)

## 2023-03-09 MED ORDER — ENTRESTO 24-26 MG PO TABS: 1.0000 | ORAL_TABLET | Freq: Two times a day (BID) | ORAL | 5 refills | Status: DC

## 2023-03-09 NOTE — Patient Instructions (Addendum)
Labs done today. We will contact you only if your labs are abnormal.  RESTART Entresto 24-26mg  (1 tablet) by mouth 2 times daily.   IF YOUR HOME SCALE SHOWS THAT YOUR WEIGHT IS 310 OR 311 TAKE 1 EXTRA METOLAZONE TABLET THAT WEEK  No other medication changes were made. Please continue all current medications as prescribed.  Your physician recommends that you schedule a follow-up appointment in: 3-4 weeks with our Clinic Pharmacist and in 3-4 months with Dr. Gala Romney. Please contact our office in November to schedule a January 2025 appointment.   If you have any questions or concerns before your next appointment please send [place] a message through [place] or call our office at [phone].    TO LEAVE A MESSAGE FOR THE NURSE SELECT OPTION 2, PLEASE LEAVE A MESSAGE INCLUDING: YOUR NAME DATE OF BIRTH CALL BACK NUMBER REASON FOR CALL**this is important as we prioritize the call backs  YOU WILL RECEIVE A CALL BACK THE SAME DAY AS LONG AS YOU CALL BEFORE 4:00 PM   Do the following things EVERYDAY: Weigh yourself in the morning before breakfast. Write it down and keep it in a log. Take your medicines as prescribed Eat low salt foods--Limit salt (sodium) to 2000 mg per day.  Stay as active as you can everyday Limit all fluids for the day to less than 2 liters   At the [facility] Clinic, you and your health needs are our priority. As part of our continuing mission to provide you with exceptional heart care, we have created designated Provider Care Teams. These Care Teams include your primary Cardiologist (physician) and Advanced Practice Providers (APPs- Physician Assistants and Nurse Practitioners) who all work together to provide you with the care you need, when you need it.   You may see any of the following providers on your designated Care Team at your next follow up: Dr Arvilla Meres Dr Marca Ancona Dr. Marcos Eke, NP Robbie Lis, [place] [facility] [place], [place] Brynda Peon, NP Karle Plumber, PharmD   Please be sure to bring in all your medications bottles to every appointment.    Thank you for choosing [facility]

## 2023-03-09 NOTE — Progress Notes (Signed)
Medication Samples have been provided to the patient.  Drug name: Sherryll Burger       Strength: 24-26mg         Qty: 1  LOT: [id number]  Exp.Date: 09/2024  Dosing instructions: take 1 tab po bid  The patient has been instructed regarding the correct time, dose, and frequency of taking this medication, including desired effects and most common side effects.   Lucas Winograd R Leanda Padmore 12:49 PM 03/09/2023

## 2023-03-09 NOTE — Progress Notes (Signed)
ADVANCED HF CLINIC NOTE  PCP: Lonie Peak, PA-C Primary Cardiologist: Dr. Bing Matter  HF Cardiologist: Dr. Gala Romney  HPI: Joe [name] is a 52 y.o. male with h/o morbid obesity, DM2, HTN, HL, OSA, CAD and systolic HF (EF 20-25%) referred by Dr. Bing Matter for further management of his HF   Has h/o RBBB but never had any other heart problems until 3/19. Saw Dr. Mauri Brooklyn in 3/19 due to CP and SOB. Cath EF 25-35% Severe diabetic CAD with LAD disease (m90 and m75),  RCA d50, LCA m50.   Echo 7/20 EF 20-25% moderate RV dysfunction  Cath January 2020 which showed 40% distal LAD, LAD/LCx stents were patent, mid RCA got 25% lesion right PDA had 50% stenosis the circumflex artery were open.  No intervention has been required.    Had cath 1/22 with stable CAD  Myoview 12/19 EF 30% Apical and infero-apical scar  Had St[employer] Jude CRT-D placed 03/19/19 with Dr. Elberta Fortis.  Admitted 9/22 for CP. HsTn was 4. Underwent cardiac cath on 03/07/2021 which showed patent stents in the LAD and LCX with diffuse distal LAD which may be a trigger for angina as well as mildly elevated RA pressure with normal PCWP and LVEDP and preserved cardiac output.   Echo 9/22 EF 35-40%  Admitted 6/23 with CP. Hstrop 35. Felt to be non-cardiac. Echo EF 30-35%  Seen in ED 04/10/22 with SOB, found to be volume overloaded. BNP only 172. Hst trop 37->47. CXR with pulmonary edema. Given IV lasix with > 2L out. Refused hospital admission, had been in [facility] x to get fluids back.  He's had recurrent volume overload over the last few months requiring frequent diuretic adjustments. NYHA IIIb/IV.   Seen for follow-up on 02/16/23. He was markedly volume overloaded. ReDS 47%. Declined admission. Torsemide increased to 80 mg daily and instructed to take 5 mg metolazone daily X 3 days. Coreg stopped. Echo with EF 20-25%. Close follow up 9/24, down 33 lbs but still with volume overload and NYHA IIIb symptoms. Discussion had about  work up for advanced therapies. Given ag,e favor transplant eval but BMI may be prohibitive currently, and VAD may be better option.   Underwent R/LHC (9/24) showing stable 3v CAD, severe biventricular failure with elevated filling pressures and very low PAPi suggestive of poor RV function. Not VAD candidate with poor RV function. Torsemide increased to 40 bid, and weekly metolazone added.  Today he returns for post heart cath follow up with his wife . Overall feeling fine. Feels like he may be on the dry side. He is not SOB walking on flat ground, has SOB walking further distances on flat ground. Has chronic LE swelling. Denies palpitations, CP, abnormal bleeding or PND/Orthopnea. Appetite ok. No fever or chills. Weight at home as low as 299 pounds, 306 today. Out of Entresto x 6 months, been told he needs PA. Saw [facility] transplant yesterday, said if he were to get worse could do short term VAD until transplant.    Cardiac Studies - R/LHC (9/24): stable 3v CAD RA 18, PA 41/35 (36), PCWP 27, CO/CI (Fick) 5.8/2.2, PVR 1.6 WU, PAPi 0.33  - Echo (8/24): EF 20 -25%  - Echo (6/23): EF 30-35%  - Echo (9/22): EF 35-40%  - R/LHC (9/22): patent stents in the LAD and LCX with diffuse distal LAD, which may have been trigger for angina. RA mean 11 PA 36/7 (mean 12) PCWP mean 12 CO/CI (Fick) 6.54/2.33 PVR 1.2 WU  - LHC (1/22): stable  CAD  - LHC (1/20): 40% distal LAD, LAD/LCx stents were patent, mid RCA got 25% lesion right PDA had 50% stenosis the circumflex artery were open.  No intervention has been required.    - Echo (7/20): EF 20-25% moderate RV dysfunction  - Cath (3/19): EF 25-35% Severe diabetic CAD with LAD disease (m90 and m75),  RCA d50, LCA m50.   Past Medical History:  Diagnosis Date   Abnormal EKG    Acute systolic heart failure (HCC) 09/06/2017   Angina pectoris (HCC) 09/05/2017   Body mass index 45.0-49.9, adult (HCC)    CAD S/P percutaneous coronary angioplasty 09/07/2017    mLAD and dLAD PCI with DES 09/06/17   CHF (congestive heart failure) (HCC)    Chronic systolic (congestive) heart failure (HCC) 03/19/2019   Complication of anesthesia 1995   "had hard time waking up; breathing w/vasectomy"   Coronary artery disease    Erectile dysfunction    Essential hypertension, benign    Fatigue    GERD (gastroesophageal reflux disease)    Gout    High cholesterol    "just started tx yesterday" (09/06/2017)   History of gout    "RX prn" (09/06/2017)   Ischemic cardiomyopathy 09/07/2017   EF 25-35%   Muscular chest pain    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   Below Left Nare (MOH's)   Obesity    Obstructive sleep apnea    OSA on CPAP    Plantar fasciitis    Pre-operative clearance 02/11/2018   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/02/2019   Saint Jude   Right bundle branch block    Shortness of breath 09/05/2017   Testosterone deficiency    Type 2 diabetes mellitus with complication, without long-term current use of insulin (HCC) 09/05/2017   Type II diabetes mellitus (HCC)    "started tx 08/28/2017"   Current Outpatient Medications  Medication Sig Dispense Refill   acetaminophen (TYLENOL) 500 MG tablet Take 1,000 mg by mouth every 6 (six) hours as needed for moderate pain or headache.     allopurinol (ZYLOPRIM) 100 MG tablet Take 100 mg by mouth daily.     Aromatic Inhalants (VICKS VAPOINHALER) INHA Inhale 1 puff into the lungs daily as needed (congestion).     aspirin EC 81 MG tablet Take 1 tablet (81 mg total) by mouth daily. 90 tablet 3   colchicine 0.6 MG tablet Take 0.6 mg by mouth daily as needed (Gout).     diclofenac Sodium (VOLTAREN) 1 % GEL APPLY 1 G TOPICALLY DAILY. 100 g 0   empagliflozin (JARDIANCE) 10 MG TABS tablet Take 1 tablet (10 mg total) by mouth daily before breakfast. 30 tablet 11   escitalopram (LEXAPRO) 10 MG tablet Take 10 mg by mouth every morning.     gabapentin (NEURONTIN) 100 MG capsule  Take 100 mg by mouth 2 (two) times daily.     ibuprofen (ADVIL) 200 MG tablet Take 800 mg by mouth every 8 (eight) hours as needed for headache or moderate pain.     isosorbide mononitrate (IMDUR) 30 MG 24 hr tablet TAKE 1 TABLET BY MOUTH EVERY DAY 90 tablet 3   metolazone (ZAROXOLYN) 2.5 MG tablet Take 1 tablet (2.5 mg total) by mouth daily. Take one tablet (2.5 mg) once weekly.  Take extra potassium (20 meq) when  you take metolazone 10 tablet 3   mexiletine (MEXITIL) 250 MG capsule Take 1 capsule (250 mg total) by mouth 2 (two) times daily.  60 capsule 11   nitroGLYCERIN (NITROSTAT) 0.4 MG SL tablet Place 0.4 mg under the tongue every 5 (five) minutes as needed for chest pain.     omeprazole (PRILOSEC) 40 MG capsule Take 1 capsule (40 mg total) by mouth daily. 90 capsule 1   OZEMPIC, 1 MG/DOSE, 4 MG/3ML SOPN Inject 1 mg into the skin every Monday.     Polyethylene Glycol 400 (BLINK TEARS OP) Place 2 drops into both eyes 4 (four) times daily as needed (for dry eyes).     potassium chloride SA (KLOR-CON M) 20 MEQ tablet Take 2 tablets (40 mEq total) by mouth every morning AND 1 tablet (20 mEq total) every evening. On days you take metolazone take 40 meq of potassium in the morning and 40 in the evening.. (Patient taking differently: Take 2 tablets (40 mEq total) by mouth every morning AND 2 tablet (40 mEq total) every evening. On days you take metolazone take 40 meq of potassium in the morning and 40 in the evening.Marland Kitchen) 90 tablet 5   ranolazine (RANEXA) 500 MG 12 hr tablet Take 1 tablet (500 mg total) by mouth 2 (two) times daily. 180 tablet 2   rosuvastatin (CRESTOR) 40 MG tablet Take 1 tablet (40 mg total) by mouth daily. 90 tablet 2   sacubitril-valsartan (ENTRESTO) 24-26 MG Take 1 tablet by mouth 2 (two) times daily. 60 tablet 2   torsemide (DEMADEX) 20 MG tablet Take 2 tablets (40 mg total) by mouth every morning AND 2 tablets (40 mg total) every evening. 90 tablet 5   triamcinolone cream (KENALOG)  0.1 % Apply 1 Application topically 2 (two) times daily.     spironolactone (ALDACTONE) 25 MG tablet Take 1 tablet (25 mg total) by mouth daily. (Patient not taking: Reported on 03/09/2023) 90 tablet 3   No current facility-administered medications for this encounter.   No Known Allergies  Social History   Socioeconomic History   Marital status: Married    Spouse name: Not on file   Number of children: Not on file   Years of education: Not on file   Highest education level: Not on file  Occupational History   Not on file  Tobacco Use   Smoking status: Never   Smokeless tobacco: Never  Vaping Use   Vaping status: Never Used  Substance and Sexual Activity   Alcohol use: No   Drug use: Never   Sexual activity: Not on file  Other Topics Concern   Not on file  Social History Narrative   Not on file   Social Determinants of Health   Financial Resource Strain: Not on file  Food Insecurity: Not on file  Transportation Needs: Not on file  Physical Activity: Not on file  Stress: Not on file  Social Connections: Unknown (10/28/2021)   Received from [facility], [facility]   Social Network    Social Network: Not on file  Intimate Partner Violence: Unknown (09/20/2021)   Received from [facility], [facility]   HITS    Physically Hurt: Not on file    Insult or Talk Down To: Not on file    Threaten Physical Harm: Not on file    Scream or Curse: Not on file   Family History  Problem Relation Age of Onset   Lymphoma Father    Diabetes Mellitus II Brother    Diabetes Mellitus II Paternal Grandfather    Lung cancer Mother    Brain cancer Mother    BP 108/78  Pulse (!) 106   Wt (!) 140.6 kg (310 lb)   SpO2 95%   BMI 38.75 kg/m   Wt Readings from Last 3 Encounters:  03/09/23 (!) 140.6 kg (310 lb)  02/23/23 (!) 138.8 kg (306 lb)  02/22/23 (!) 144.1 kg (317 lb 9.6 oz)   PHYSICAL EXAM: General:  NAD. No resp difficulty, walked into clinic HEENT: Normal Neck:  Supple. No JVD. Carotids 2+ bilat; no bruits. No lymphadenopathy or thryomegaly appreciated. Cor: PMI nondisplaced. Tachy rate & rhythm. No rubs, gallops or murmurs. Lungs: Clear Abdomen: Soft, obese, nontender, nondistended. No hepatosplenomegaly. No bruits or masses. Good bowel sounds. Extremities: No cyanosis, clubbing, rash, 1+ BLE edema w/ venous stasis changes  Neuro: Alert & oriented x 3, cranial nerves grossly intact. Moves all 4 extremities w/o difficulty. Affect pleasant.  ECG (personally reviewed): ST 102 bpm, v-paced  ReDs 43%-->34% today  Device interrogation (personally reviewed): 94% BiV paced, 1 episodes of VF (7 seconds) on 02/25/23, no AF, CorVue stable, 4-6 hr/day activity.  ASSESSMENT & PLAN: 1. Chronic Systolic HF - due to iCM - Echo (8/20): EF 20-25%  - Echo (9/22): 35-40% - Echo (6/23): EF 30-35% - Echo (8/24): EF 20-25%, RV ok. - R/LHC (9/24): stable 3v CAD, elevated biventricular filling pressures with low PAPi; CI 2.2 - Improved, NYHA II-IIb, volume much improved, REDs 34% today, weight down another 7 lbs - Continue torsemide 40 mg bid + weekly metolazone/extra 40 KCL. OK to take extra dose of metolazone weekly PRN (home weight of 310-311 lbs) - Restart Entresto 24/26 mg bid - Continue Jardiance 10 mg daily. - Continue spiro 25 mg daily. - Add low dose Toprol next. - He appears to have end-stage HF. With poor RV function, not a VAD candidate. BMI may prohibit transplant consideration. He has been referred to [facility] for transplant work up. - Labs today, BMET in 7-10 days.  2. CAD with chronic stable angina - s/p multiple stents to LAD and LCX - Cath 9/22 stable CAD - LHC (9/24) with stable 3v CAD - No chest pain today. - Continue ASA. - Continue Imdur 30 mg daily. - Continue Ranexa 500 mg bid.  - On high-intensity statin. - Followed Dr. Kirtland Bouchard.  3. DM2 - A1c 7.2 (1/24) - Continue SGLT2i  4. OSA - Not using CPAP, says machine was recalled a while ago -  Follows with Dr. Mayford Knife  5. Obesity - Body mass index is 38.75 kg/m. - Has struggled with weight loss - Continue Ozempic, may have better success with tirzepatide.  6. Frequent PVCs - On mexilitene 250 mg bid - Zio 5/22 PVC burden 5.4%.  - Followed by Dr. Elberta Fortis - Continue current management  Follow up in 3-4 weeks with APP or PharmD (check volume and add beta blocker) and 3-4 months with Dr. Gala Romney.  Jacklynn Ganong, FNP  12:18 PM

## 2023-03-09 NOTE — Addendum Note (Signed)
Encounter addended by: Orlene Och, [place] on: 03/09/2023 3:42 PM  Actions taken: Clinical Note Signed, Charge Capture section accepted

## 2023-03-09 NOTE — Progress Notes (Signed)
ReDS Vest / Clip - 03/09/23 1200       ReDS Vest / Clip   Station Marker D    Ruler Value 46    ReDS Value Range Low volume    ReDS Actual Value 34

## 2023-03-12 ENCOUNTER — Telehealth (HOSPITAL_COMMUNITY): Payer: Self-pay | Admitting: Cardiology

## 2023-03-12 MED ORDER — METOPROLOL SUCCINATE ER 25 MG PO TB24: 12.5000 mg | ORAL_TABLET | Freq: Every day | ORAL | 3 refills | Status: DC

## 2023-03-12 NOTE — Telephone Encounter (Signed)
-----   Message from Jacklynn Ganong sent at 03/09/2023  4:31 PM EDT ----- Labs stable.  Hold off on restarting Entresto today. Please start Toprol XL 12.5 mg daily instead. Will let your kidney's "rest" for a couple weeks before re-challenge with Entresto. \ We can cancel his repeat labs

## 2023-03-12 NOTE — Telephone Encounter (Signed)
Patient called.  Patient aware.  

## 2023-03-16 ENCOUNTER — Encounter (HOSPITAL_COMMUNITY): Payer: Self-pay

## 2023-03-16 ENCOUNTER — Other Ambulatory Visit (HOSPITAL_COMMUNITY): Payer: Self-pay

## 2023-03-16 MED ORDER — NITROGLYCERIN 0.4 MG SL SUBL: 0.4000 mg | SUBLINGUAL_TABLET | SUBLINGUAL | 3 refills | Status: DC | PRN

## 2023-03-28 ENCOUNTER — Inpatient Hospital Stay (HOSPITAL_COMMUNITY)
Admission: EM | Admit: 2023-03-28 | Discharge: 2023-04-01 | DRG: 291 | Disposition: A | Discharge status: Home / Self Care | Payer: 59 | Attending: Family Medicine | Admitting: Family Medicine

## 2023-03-28 ENCOUNTER — Other Ambulatory Visit: Payer: Self-pay

## 2023-03-28 ENCOUNTER — Encounter (HOSPITAL_COMMUNITY): Payer: Self-pay

## 2023-03-28 ENCOUNTER — Emergency Department (HOSPITAL_COMMUNITY): Payer: 59

## 2023-03-28 DIAGNOSIS — Z808 Family history of malignant neoplasm of other organs or systems: Secondary | ICD-10-CM

## 2023-03-28 DIAGNOSIS — Z7982 Long term (current) use of aspirin: Secondary | ICD-10-CM

## 2023-03-28 DIAGNOSIS — M1A9XX0 Chronic gout, unspecified, without tophus (tophi): Secondary | ICD-10-CM | POA: Diagnosis present

## 2023-03-28 DIAGNOSIS — I5022 Chronic systolic (congestive) heart failure: Secondary | ICD-10-CM | POA: Diagnosis present

## 2023-03-28 DIAGNOSIS — Z955 Presence of coronary angioplasty implant and graft: Secondary | ICD-10-CM

## 2023-03-28 DIAGNOSIS — R55 Syncope and collapse: Principal | ICD-10-CM

## 2023-03-28 DIAGNOSIS — G4733 Obstructive sleep apnea (adult) (pediatric): Secondary | ICD-10-CM | POA: Diagnosis not present

## 2023-03-28 DIAGNOSIS — I472 Ventricular tachycardia, unspecified: Secondary | ICD-10-CM | POA: Diagnosis present

## 2023-03-28 DIAGNOSIS — I160 Hypertensive urgency: Secondary | ICD-10-CM | POA: Diagnosis present

## 2023-03-28 DIAGNOSIS — I5084 End stage heart failure: Secondary | ICD-10-CM | POA: Diagnosis present

## 2023-03-28 DIAGNOSIS — E669 Obesity, unspecified: Secondary | ICD-10-CM | POA: Diagnosis present

## 2023-03-28 DIAGNOSIS — E876 Hypokalemia: Secondary | ICD-10-CM | POA: Diagnosis present

## 2023-03-28 DIAGNOSIS — Z9861 Coronary angioplasty status: Secondary | ICD-10-CM

## 2023-03-28 DIAGNOSIS — I110 Hypertensive heart disease with heart failure: Principal | ICD-10-CM | POA: Diagnosis present

## 2023-03-28 DIAGNOSIS — R9431 Abnormal electrocardiogram [ECG] [EKG]: Secondary | ICD-10-CM | POA: Diagnosis not present

## 2023-03-28 DIAGNOSIS — Z95810 Presence of automatic (implantable) cardiac defibrillator: Secondary | ICD-10-CM

## 2023-03-28 DIAGNOSIS — K219 Gastro-esophageal reflux disease without esophagitis: Secondary | ICD-10-CM | POA: Diagnosis present

## 2023-03-28 DIAGNOSIS — I4901 Ventricular fibrillation: Secondary | ICD-10-CM | POA: Diagnosis present

## 2023-03-28 DIAGNOSIS — N529 Male erectile dysfunction, unspecified: Secondary | ICD-10-CM | POA: Diagnosis present

## 2023-03-28 DIAGNOSIS — I10 Essential (primary) hypertension: Secondary | ICD-10-CM | POA: Diagnosis present

## 2023-03-28 DIAGNOSIS — I469 Cardiac arrest, cause unspecified: Secondary | ICD-10-CM

## 2023-03-28 DIAGNOSIS — Z7984 Long term (current) use of oral hypoglycemic drugs: Secondary | ICD-10-CM

## 2023-03-28 DIAGNOSIS — E785 Hyperlipidemia, unspecified: Secondary | ICD-10-CM | POA: Diagnosis present

## 2023-03-28 DIAGNOSIS — I4510 Unspecified right bundle-branch block: Secondary | ICD-10-CM | POA: Diagnosis present

## 2023-03-28 DIAGNOSIS — I493 Ventricular premature depolarization: Secondary | ICD-10-CM | POA: Diagnosis not present

## 2023-03-28 DIAGNOSIS — Z801 Family history of malignant neoplasm of trachea, bronchus and lung: Secondary | ICD-10-CM

## 2023-03-28 DIAGNOSIS — I2510 Atherosclerotic heart disease of native coronary artery without angina pectoris: Secondary | ICD-10-CM | POA: Diagnosis present

## 2023-03-28 DIAGNOSIS — Z833 Family history of diabetes mellitus: Secondary | ICD-10-CM

## 2023-03-28 DIAGNOSIS — M109 Gout, unspecified: Secondary | ICD-10-CM | POA: Diagnosis present

## 2023-03-28 DIAGNOSIS — E118 Type 2 diabetes mellitus with unspecified complications: Secondary | ICD-10-CM | POA: Diagnosis not present

## 2023-03-28 DIAGNOSIS — Z85828 Personal history of other malignant neoplasm of skin: Secondary | ICD-10-CM

## 2023-03-28 DIAGNOSIS — Z6838 Body mass index (BMI) 38.0-38.9, adult: Secondary | ICD-10-CM

## 2023-03-28 DIAGNOSIS — E66813 Obesity, class 3: Secondary | ICD-10-CM

## 2023-03-28 DIAGNOSIS — Z807 Family history of other malignant neoplasms of lymphoid, hematopoietic and related tissues: Secondary | ICD-10-CM

## 2023-03-28 DIAGNOSIS — Z79899 Other long term (current) drug therapy: Secondary | ICD-10-CM

## 2023-03-28 DIAGNOSIS — I5082 Biventricular heart failure: Secondary | ICD-10-CM | POA: Diagnosis present

## 2023-03-28 DIAGNOSIS — E7800 Pure hypercholesterolemia, unspecified: Secondary | ICD-10-CM | POA: Diagnosis present

## 2023-03-28 DIAGNOSIS — Z91199 Patient's noncompliance with other medical treatment and regimen due to unspecified reason: Secondary | ICD-10-CM

## 2023-03-28 DIAGNOSIS — E119 Type 2 diabetes mellitus without complications: Secondary | ICD-10-CM | POA: Diagnosis present

## 2023-03-28 DIAGNOSIS — I252 Old myocardial infarction: Secondary | ICD-10-CM

## 2023-03-28 LAB — CBC WITH DIFFERENTIAL/PLATELET
Abs Immature Granulocytes: 0.03 10*3/uL (ref 0.00–0.07)
Basophils Absolute: 0.1 10*3/uL (ref 0.0–0.1)
Basophils Relative: 1 %
Eosinophils Absolute: 0.2 10*3/uL (ref 0.0–0.5)
Eosinophils Relative: 2 %
HCT: 44.2 % (ref 39.0–52.0)
Hemoglobin: 15.3 g/dL (ref 13.0–17.0)
Immature Granulocytes: 0 %
Lymphocytes Relative: 11 %
Lymphs Abs: 1 10*3/uL (ref 0.7–4.0)
MCH: 30.7 pg (ref 26.0–34.0)
MCHC: 34.6 g/dL (ref 30.0–36.0)
MCV: 88.8 fL (ref 80.0–100.0)
Monocytes Absolute: 0.8 10*3/uL (ref 0.1–1.0)
Monocytes Relative: 9 %
Neutro Abs: 6.7 10*3/uL (ref 1.7–7.7)
Neutrophils Relative %: 77 %
Platelets: 173 10*3/uL (ref 150–400)
RBC: 4.98 MIL/uL (ref 4.22–5.81)
RDW: 13.2 % (ref 11.5–15.5)
WBC: 8.7 10*3/uL (ref 4.0–10.5)
nRBC: 0 % (ref 0.0–0.2)

## 2023-03-28 LAB — COMPREHENSIVE METABOLIC PANEL
ALT: 25 U/L (ref 0–44)
AST: 27 U/L (ref 15–41)
Albumin: 3.5 g/dL (ref 3.5–5.0)
Alkaline Phosphatase: 87 U/L (ref 38–126)
Anion gap: 14 (ref 5–15)
BUN: 13 mg/dL (ref 6–20)
CO2: 26 mmol/L (ref 22–32)
Calcium: 8.9 mg/dL (ref 8.9–10.3)
Chloride: 94 mmol/L — ABNORMAL LOW (ref 98–111)
Creatinine, Ser: 1.13 mg/dL (ref 0.61–1.24)
GFR, Estimated: >60 mL/min (ref >60)
Glucose, Bld: 206 mg/dL — ABNORMAL HIGH (ref 70–99)
Potassium: 2.5 mmol/L — CL (ref 3.5–5.1)
Sodium: 134 mmol/L — ABNORMAL LOW (ref 135–145)
Total Bilirubin: 1.2 mg/dL (ref 0.3–1.2)
Total Protein: 7.5 g/dL (ref 6.5–8.1)

## 2023-03-28 LAB — TROPONIN I (HIGH SENSITIVITY): Troponin I (High Sensitivity): 24 ng/L — ABNORMAL HIGH (ref <18)

## 2023-03-28 LAB — BRAIN NATRIURETIC PEPTIDE: B Natriuretic Peptide: 178.3 pg/mL — ABNORMAL HIGH (ref 0.0–100.0)

## 2023-03-28 LAB — MAGNESIUM: Magnesium: 2 mg/dL (ref 1.7–2.4)

## 2023-03-28 LAB — CBG MONITORING, ED: Glucose-Capillary: 204 mg/dL — ABNORMAL HIGH (ref 70–99)

## 2023-03-28 MED ORDER — POTASSIUM CHLORIDE 10 MEQ/100ML IV SOLN
10.0000 meq | INTRAVENOUS | Status: AC
  Administered 2023-03-28 – 2023-03-29 (×4): 10 meq via INTRAVENOUS
  Filled 2023-03-28 (×4): qty 100

## 2023-03-28 MED ORDER — INSULIN ASPART 100 UNIT/ML IJ SOLN
0.0000 [IU] | INTRAMUSCULAR | Status: DC
  Administered 2023-03-29: 3 [IU] via SUBCUTANEOUS
  Administered 2023-03-29 – 2023-03-30 (×6): 2 [IU] via SUBCUTANEOUS
  Administered 2023-03-30 (×2): 1 [IU] via SUBCUTANEOUS
  Administered 2023-03-30: 2 [IU] via SUBCUTANEOUS

## 2023-03-28 MED ORDER — POTASSIUM CHLORIDE CRYS ER 20 MEQ PO TBCR
60.0000 meq | EXTENDED_RELEASE_TABLET | Freq: Once | ORAL | Status: AC
  Administered 2023-03-28: 60 meq via ORAL
  Filled 2023-03-28: qty 3

## 2023-03-28 NOTE — Subjective & Objective (Signed)
Pt is coming form home w Syncope for about 2 min Found by wife Pt does not remember  Associated with feeling hot, nauseous and dizzy, arms and held felt numb prior to syncope Hx of heart failure followed by [facility] No CP, now back to baseline,  AICD did not fire  He did take an additional dose of lasix today

## 2023-03-28 NOTE — H&P (Signed)
Joe [name] ZOX:[id number] DOB: August 24, 1970 DOA: 03/28/2023     PCP: Lonie Peak, PA-C   Outpatient Specialists: * NONE CARDS: Dr. [name], MD HF Cardiologist: Dr. Gala Romney   Patient arrived to ER on 03/28/23 at 2141 Referred by Attending Lonell Grandchild, MD   Patient coming from:    home Lives With family     Chief Complaint:   Chief Complaint  Patient presents with   Loss of Consciousness    HPI: Joe [name] is a 52 y.o. male with medical history significant of Systolic CHF EF 25-35%,CAD, HTN, GERD, gout, HLD, OSA, obesity, RBBB, DM2,     Presented with   syncope Pt is coming form home w Syncope for about 2 min Found by wife Pt does not remember  Associated with feeling hot, nauseous and dizzy, arms and held felt numb prior to syncope Hx of heart failure followed by [facility] No CP, now back to baseline,  AICD did not fire  He did take an additional dose of lasix today    Denies significant ETOH intake *** Does not smoke*** but interested in quitting***  Lab Results  Component Value Date   SARSCOV2NAA NEGATIVE 03/04/2021   SARSCOV2NAA NEGATIVE 06/25/2020   SARSCOV2NAA NOT DETECTED 03/15/2019        Regarding pertinent Chronic problems:     Hyperlipidemia -  on statins Crestor Lipid Panel     Component Value Date/Time   CHOL 172 11/28/2021 0030   CHOL 103 03/21/2021 0923   TRIG 244 (H) 11/28/2021 0030   HDL 27 (L) 11/28/2021 0030   HDL 31 (L) 03/21/2021 0923   CHOLHDL 6.4 11/28/2021 0030   VLDL 49 (H) 11/28/2021 0030   LDLCALC 96 11/28/2021 0030   LDLCALC 34 03/21/2021 0923   LABVLDL 38 03/21/2021 0923     HTN on imdur, toprol   chronic CHF diastolic/systolic/ combined -  On Metolazone, Demadex last echo  Recent Results (from the past 81191 hour(s))  ECHOCARDIOGRAM COMPLETE   Collection Time: 02/15/23 12:00 PM  Result Value   S' Lateral 7.00   Est EF < 20%   Narrative      ECHOCARDIOGRAM REPORT        IMPRESSIONS    1. Left ventricular ejection fraction, by estimation, is <20%. The left ventricle has severely decreased function. The left ventricle demonstrates global hypokinesis. The left ventricular internal cavity size was severely dilated. Left ventricular  diastolic parameters are indeterminate.  2. Right ventricular systolic function is normal. The right ventricular size is normal.  3. Right atrial size was mildly dilated.  4. The mitral valve is normal in structure. Mild mitral valve regurgitation. No evidence of mitral stenosis.  5. The aortic valve is normal in structure. Aortic valve regurgitation is not visualized. No aortic stenosis is present.  6. The inferior vena cava is normal in size with greater than 50% respiratory variability, suggesting right atrial pressure of 3 mmHg.           CAD  - On Aspirin, statin, betablocker,                  -  followed by cardiology                - last cardiac cath     [facility] (9/24): stable 3v CAD RA 18, PA 41/35 (36), PCWP 27, CO/CI (Fick) 5.8/2.2, PVR 1.6 WU, PAPi 0.33    DM 2 -  Lab Results  Component Value Date   HGBA1C 10.7 (H) 11/29/2021   on insulin,    obesity-   BMI Readings from Last 1 Encounters:  03/09/23 38.75 kg/m       OSA - noncompliant with CPAP      While in ER:       Lab Orders         Comprehensive metabolic panel         CBC with Differential         Brain natriuretic peptide         Magnesium         CBG monitoring, ED       CXR - Mild cardiomegaly with vascular congestion     Following Medications were ordered in ER: Medications  potassium chloride SA (KLOR-CON M) CR tablet 60 mEq (has no administration in time range)  potassium chloride 10 mEq in 100 mL IVPB (has no administration in time range)    _______________________________________________________ ER Provider Called:       DrMarland Kitchen  They Recommend admit to medicine *** Will see in AM  ***SEEN in ER     ED Triage Vitals   Encounter Vitals Group     BP 03/28/23 2144 110/67     Systolic BP Percentile --      Diastolic BP Percentile --      Pulse Rate 03/28/23 2144 95     Resp 03/28/23 2144 15     Temp 03/28/23 2144 97.8 F (36.6 C)     Temp Source 03/28/23 2144 Oral     SpO2 03/28/23 2141 98 %     Weight --      Height --      Head Circumference --      Peak Flow --      Pain Score 03/28/23 2145 0     Pain Loc --      Pain Education --      Exclude from Growth Chart --   BJYN(82)@     _________________________________________ Significant initial  Findings: Abnormal Labs Reviewed  COMPREHENSIVE METABOLIC PANEL - Abnormal; Notable for the following components:      Result Value   Sodium 134 (*)    Potassium 2.5 (*)    Chloride 94 (*)    Glucose, Bld 206 (*)    All other components within normal limits  BRAIN NATRIURETIC PEPTIDE - Abnormal; Notable for the following components:   B Natriuretic Peptide 178.3 (*)    All other components within normal limits  CBG MONITORING, ED - Abnormal; Notable for the following components:   Glucose-Capillary 204 (*)    All other components within normal limits  TROPONIN I (HIGH SENSITIVITY) - Abnormal; Notable for the following components:   Troponin I (High Sensitivity) 24 (*)    All other components within normal limits    _________________________ Troponin  ordered Cardiac Panel (last 3 results) Recent Labs    03/28/23 2205  TROPONINIHS 24*     ECG: Ordered Personally reviewed and interpreted by me showing: HR : 103 Rhythm: VENTRICULAR PACED RHYTHM Paired ventricular premature complexes Compared to previous tracing Premature ventricular complexes are present QTC 595  BNP (last 3 results) Recent Labs    02/20/23 0930 03/09/23 1243 03/28/23 2205  BNP 321.7* 221.8* 178.3*     COVID-19 Labs  No results for input(s): "DDIMER", "FERRITIN", "LDH", "CRP" in the last 72 hours.  Lab Results  Component Value Date   SARSCOV2NAA NEGATIVE  03/04/2021  SARSCOV2NAA NEGATIVE 06/25/2020   SARSCOV2NAA NOT DETECTED 03/15/2019     The recent clinical data is shown below. Vitals:   03/28/23 2215 03/28/23 2230 03/28/23 2245 03/28/23 2315  BP: (!) 89/60 96/80 (!) 88/67 (!) 87/70  Pulse: 91 88 85 85  Resp: (!) 9 15 17 15   Temp:      TempSrc:      SpO2: 94% 97% 93% 96%     WBC     Component Value Date/Time   WBC 8.7 03/28/2023 2205   LYMPHSABS 1.0 03/28/2023 2205   LYMPHSABS 0.9 01/28/2021 0858   MONOABS 0.8 03/28/2023 2205   EOSABS 0.2 03/28/2023 2205   EOSABS 0.2 01/28/2021 0858   BASOSABS 0.1 03/28/2023 2205   BASOSABS 0.0 01/28/2021 0858    Lactic Acid, Venous No results found for: "LATICACIDVEN"   Procalcitonin *** Ordered      UA   no evidence of UTI      Urine analysis:    Component Value Date/Time   COLORURINE YELLOW 04/10/2022 0225   APPEARANCEUR CLEAR 04/10/2022 0225   LABSPEC 1.025 04/10/2022 0225   PHURINE 6.0 04/10/2022 0225   GLUCOSEU >=500 (A) 04/10/2022 0225   HGBUR NEGATIVE 04/10/2022 0225   BILIRUBINUR NEGATIVE 04/10/2022 0225   KETONESUR NEGATIVE 04/10/2022 0225   PROTEINUR NEGATIVE 04/10/2022 0225   NITRITE NEGATIVE 04/10/2022 0225   LEUKOCYTESUR NEGATIVE 04/10/2022 0225    Results for orders placed or performed during the hospital encounter of 11/27/21  Urine Culture     Status: None   Collection Time: 11/27/21  2:20 PM   Specimen: Urine, Random  Result Value Ref Range Status   Specimen Description   Final    URINE, RANDOM Performed at [facility], [address] [place], [place] [address]    Special Requests   Final    NONE Performed at [facility], [address] [place], [place] [address]    Culture   Final    NO GROWTH Performed at [facility], [address] [place], [place] [address]    Report Status 11/28/2021 FINAL  Final      ________________________________________________________________  Arterial ***Venous  Blood Gas  result:  pH *** pCO2 ***; pO2 ***;     %O2 Sat ***.  ABG    Component Value Date/Time   PHART 7.406 02/23/2023 1707   PCO2ART 50.3 (H) 02/23/2023 1707   PO2ART 64 (L) 02/23/2023 1707   HCO3 31.5 (H) 02/23/2023 1707   TCO2 33 (H) 02/23/2023 1707   O2SAT 92 02/23/2023 1707    __________________________________________________________ Recent Labs  Lab 03/28/23 2205  NA 134*  K 2.5*  CO2 26  GLUCOSE 206*  BUN 13  CREATININE 1.13  CALCIUM 8.9  MG 2.0    Cr  stable,  Lab Results  Component Value Date   CREATININE 1.13 03/28/2023   CREATININE 1.32 (H) 03/09/2023   CREATININE 1.05 02/23/2023    Recent Labs  Lab 03/28/23 2205  AST 27  ALT 25  ALKPHOS 87  BILITOT 1.2  PROT 7.5  ALBUMIN 3.5   Lab Results  Component Value Date   CALCIUM 8.9 03/28/2023    Plt: Lab Results  Component Value Date   PLT 173 03/28/2023       Recent Labs  Lab 03/28/23 2205  WBC 8.7  NEUTROABS 6.7  HGB 15.3  HCT 44.2  MCV 88.8  PLT 173    HG/HCT  stable,  Component Value Date/Time   HGB 15.3 03/28/2023 2205   HGB 14.9 01/28/2021 0858   HCT 44.2 03/28/2023 2205   HCT 45.0 01/28/2021 0858   MCV 88.8 03/28/2023 2205   MCV 93 01/28/2021 0858    _______________________________________________ Hospitalist was called for admission for syncope   The following Work up has been ordered so far:  Orders Placed This Encounter  Procedures   DG Chest Portable 1 View   Comprehensive metabolic panel   CBC with Differential   Brain natriuretic peptide   Magnesium   Interrogate Pacemaker if present   Consult for Unassigned Medical Admission   CBG monitoring, ED   EKG 12-Lead     OTHER Significant initial  Findings:  labs showing:     DM  labs:  HbA1C: No results for input(s): "HGBA1C" in the last 8760 hours.     CBG (last 3)  Recent Labs    03/28/23 2141  GLUCAP 204*          Cultures:    Component Value Date/Time   SDES  11/27/2021 1420    URINE,  RANDOM Performed at [facility], [address] [place], [place] [address]    [facility]  11/27/2021 1420    NONE Performed at [facility], [address], [place], [place] [address]    CULT  11/27/2021 1420    NO GROWTH Performed at [facility], [address] [place], [place] [address]    REPTSTATUS 11/28/2021 FINAL 11/27/2021 1420     Radiological Exams on Admission: No results found. _______________________________________________________________________________________________________ Latest  Blood pressure (!) 87/70, pulse 85, temperature 97.8 F (36.6 C), temperature source Oral, resp. rate 15, SpO2 96%.   Vitals  labs and radiology finding personally reviewed  Review of Systems:    Pertinent positives include: syncope  Constitutional:  No weight loss, night sweats, Fevers, chills, fatigue, weight loss  HEENT:  No headaches, Difficulty swallowing,Tooth/dental problems,Sore throat,  No sneezing, itching, ear ache, nasal congestion, post nasal drip,  Cardio-vascular:  No chest pain, Orthopnea, PND, anasarca, dizziness, palpitations.no Bilateral lower extremity swelling  GI:  No heartburn, indigestion, abdominal pain, nausea, vomiting, diarrhea, change in bowel habits, loss of appetite, melena, blood in stool, hematemesis Resp:  no shortness of breath at rest. No dyspnea on exertion, No excess mucus, no productive cough, No non-productive cough, No coughing up of blood.No change in color of mucus.No wheezing. Skin:  no rash or lesions. No jaundice GU:  no dysuria, change in color of urine, no urgency or frequency. No straining to urinate.  No flank pain.  Musculoskeletal:  No joint pain or no joint swelling. No decreased range of motion. No back pain.  Psych:  No change in mood or affect. No depression or anxiety. No memory loss.  Neuro: no localizing neurological complaints, no tingling, no weakness, no double vision, no gait  abnormality, no slurred speech, no confusion  All systems reviewed and apart from HOPI all are negative _______________________________________________________________________________________________ Past Medical History:   Past Medical History:  Diagnosis Date   Abnormal EKG    Acute systolic heart failure (HCC) 09/06/2017   Angina pectoris (HCC) 09/05/2017   Body mass index 45.0-49.9, adult (HCC)    CAD S/P percutaneous coronary angioplasty 09/07/2017   mLAD and dLAD PCI with DES 09/06/17   CHF (congestive heart failure) (HCC)    Chronic systolic (congestive) heart failure (HCC) 03/19/2019   Complication of anesthesia 1995   "had  hard time waking up; breathing w/vasectomy"   Coronary artery disease    Erectile dysfunction    Essential hypertension, benign    Fatigue    GERD (gastroesophageal reflux disease)    Gout    High cholesterol    "just started tx yesterday" (09/06/2017)   History of gout    "RX prn" (09/06/2017)   Ischemic cardiomyopathy 09/07/2017   EF 25-35%   Muscular chest pain    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   Below Left Nare (MOH's)   Obesity    Obstructive sleep apnea    OSA on CPAP    Plantar fasciitis    Pre-operative clearance 02/11/2018   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/02/2019   [employer]   Right bundle branch block    Shortness of breath 09/05/2017   Testosterone deficiency    Type 2 diabetes mellitus with complication, without long-term current use of insulin (HCC) 09/05/2017   Type II diabetes mellitus (HCC)    "started tx 08/28/2017"      Past Surgical History:  Procedure Laterality Date   BACK SURGERY     BIV ICD INSERTION CRT-D N/A 03/19/2019   Procedure: BIV ICD INSERTION CRT-D;  Surgeon: Regan Lemming, MD;  Location: [facility] INVASIVE CV LAB;  Service: Cardiovascular;  Laterality: N/A;   CORONARY ANGIOPLASTY WITH STENT PLACEMENT  09/06/2017   "3 stents"   CORONARY STENT INTERVENTION N/A  09/06/2017   Procedure: CORONARY STENT INTERVENTION;  Surgeon: Corky Crafts, MD;  Location[facility] INVASIVE CV LAB;  Service: Cardiovascular;  Laterality: N/A;   CORONARY STENT INTERVENTION N/A 02/15/2018   Procedure: CORONARY STENT INTERVENTION;  Surgeon: Corky Crafts, MD;  Location: [facility] INVASIVE CV LAB;  Service: Cardiovascular;  Laterality: N/A;   KNEE ARTHROSCOPY Right    LEFT HEART CATH AND CORONARY ANGIOGRAPHY N/A 09/06/2017   Procedure: LEFT HEART CATH AND CORONARY ANGIOGRAPHY;  Surgeon: Corky Crafts, MD;  Location: [facility] INVASIVE CV LAB;  Service: Cardiovascular;  Laterality: N/A;   LEFT HEART CATH AND CORONARY ANGIOGRAPHY N/A 02/15/2018   Procedure: LEFT HEART CATH AND CORONARY ANGIOGRAPHY;  Surgeon: Corky Crafts, MD;  Location: [facility] INVASIVE CV LAB;  Service: Cardiovascular;  Laterality: N/A;   LEFT HEART CATH AND CORONARY ANGIOGRAPHY N/A 06/27/2018   Procedure: LEFT HEART CATH AND CORONARY ANGIOGRAPHY;  Surgeon: Corky Crafts, MD;  Location: [facility] INVASIVE CV LAB;  Service: Cardiovascular;  Laterality: N/A;   LEFT HEART CATH AND CORONARY ANGIOGRAPHY N/A 06/28/2020   Procedure: LEFT HEART CATH AND CORONARY ANGIOGRAPHY;  Surgeon: Swaziland, Peter M, MD;  Location: [facility] INVASIVE CV LAB;  Service: Cardiovascular;  Laterality: N/A;   LUMBAR SPINE SURGERY  ~ 2001   Intradiscal Electrothermoplasty   RIGHT/LEFT HEART CATH AND CORONARY ANGIOGRAPHY N/A 03/07/2021   Procedure: RIGHT/LEFT HEART CATH AND CORONARY ANGIOGRAPHY;  Surgeon: Laurey Morale, MD;  Location: [facility] INVASIVE CV LAB;  Service: Cardiovascular;  Laterality: N/A;   RIGHT/LEFT HEART CATH AND CORONARY ANGIOGRAPHY N/A 02/23/2023   Procedure: RIGHT/LEFT HEART CATH AND CORONARY ANGIOGRAPHY;  Surgeon: Dolores Patty, MD;  Location: [facility] INVASIVE CV LAB;  Service: Cardiovascular;  Laterality: N/A;   VASECTOMY  1995    Social History:  Ambulatory *** independently cane, walker  wheelchair bound, bed bound     reports  that he has never smoked. He has never used smokeless tobacco. He reports that he does not drink alcohol and does not use drugs.   Family History:  Family History  Problem Relation Age of Onset   Lymphoma Father    Diabetes Mellitus II Brother    Diabetes Mellitus II Paternal Grandfather    Lung cancer Mother    Brain cancer Mother    ______________________________________________________________________________________________ Allergies: No Known Allergies   Prior to Admission medications   Medication Sig Start Date End Date Taking? Authorizing Provider  acetaminophen (TYLENOL) 500 MG tablet Take 1,000 mg by mouth every 6 (six) hours as needed for moderate pain or headache.   Yes [provider]  allopurinol (ZYLOPRIM) 100 MG tablet Take 100 mg by mouth daily.   Yes [provider]  Aromatic Inhalants (VICKS VAPOINHALER) INHA Inhale 1 puff into the lungs daily as needed (congestion).   Yes [provider]  aspirin EC 81 MG tablet Take 1 tablet (81 mg total) by mouth daily. 09/05/17  Yes Georgeanna Lea, MD  colchicine 0.6 MG tablet Take 0.6 mg by mouth daily as needed (Gout). 05/10/20  Yes [provider]  diclofenac Sodium (VOLTAREN) 1 % GEL APPLY 1 G TOPICALLY DAILY. Patient taking differently: Apply 2 g topically daily as needed (for pain). 01/11/23  Yes Standiford, Jenelle Mages, DPM  empagliflozin (JARDIANCE) 10 MG TABS tablet Take 1 tablet (10 mg total) by mouth daily before breakfast. 02/23/23  Yes Andrey Farmer, PA-C  escitalopram (LEXAPRO) 10 MG tablet Take 10 mg by mouth at bedtime. 06/24/21  Yes [provider]  gabapentin (NEURONTIN) 100 MG capsule Take 100-200 mg by mouth See admin instructions. Take 2 capsules by mouth in the morning and 1 capsule at bedtime or vice versa 11/09/21  Yes [provider]  isosorbide mononitrate (IMDUR) 30 MG 24 hr tablet TAKE 1 TABLET BY MOUTH EVERY DAY 10/02/22  Yes Georgeanna Lea, MD  metolazone (ZAROXOLYN) 2.5 MG tablet Take 1 tablet (2.5 mg total) by mouth daily. Take one tablet (2.5 mg) once weekly.  Take extra potassium (20 meq) when  you take metolazone Patient taking differently: Take 2.5 mg by mouth See admin instructions. Take one tablet (2.5 mg) once weekly on Saturday.  May take 1 tablet during the week for weight gain. Take extra potassium (20 meq) when  you take metolazone 02/23/23  Yes Bensimhon, Bevelyn Buckles, MD  metoprolol succinate (TOPROL-XL) 25 MG 24 hr tablet Take 0.5 tablets (12.5 mg total) by mouth daily. Take with or immediately following a meal. 03/12/23  Yes Milford, Anderson Malta, FNP  mexiletine (MEXITIL) 250 MG capsule Take 1 capsule (250 mg total) by mouth 2 (two) times daily. 12/18/22  Yes Camnitz, Andree Coss, MD  nitroGLYCERIN (NITROSTAT) 0.4 MG SL tablet Place 1 tablet (0.4 mg total) under the tongue every 5 (five) minutes as needed for chest pain. 03/16/23  Yes Milford, Anderson Malta, FNP  omeprazole (PRILOSEC) 40 MG capsule Take 1 capsule (40 mg total) by mouth daily. 01/14/19  Yes Georgeanna Lea, MD  OZEMPIC, 1 MG/DOSE, 4 MG/3ML SOPN Inject 1 mg into the skin every Monday. 12/28/22  Yes [provider]  Polyethylene Glycol 400 (BLINK TEARS OP) Place 2 drops into both eyes 4 (four) times daily as needed (for dry eyes).   Yes [provider]  potassium chloride SA (KLOR-CON M) 20 MEQ tablet Take 2 tablets (40 mEq total) by mouth every morning AND 1 tablet (20 mEq total) every evening. On days you take metolazone take 40 meq of potassium in the morning and 40 in the evening.. Patient taking differently: Take 2  tablets (40 mEq total) by mouth every morning AND 2 tablet (40 mEq total) every evening. On days you take metolazone take 40 meq of potassium in the morning and 40 in the evening.. 02/23/23  Yes Bensimhon, Bevelyn Buckles, MD  ranolazine (RANEXA) 500 MG 12 hr tablet Take 1 tablet (500 mg total) by mouth 2 (two) times daily. 07/18/22  Yes Georgeanna Lea, MD  rosuvastatin (CRESTOR) 40 MG tablet Take 1 tablet (40 mg total) by mouth daily. 07/18/22  Yes Georgeanna Lea, MD  spironolactone (ALDACTONE) 25 MG tablet Take 1 tablet (25 mg total) by mouth daily. 11/20/22 11/15/23 Yes Milford, Anderson Malta, FNP  torsemide (DEMADEX) 20 MG tablet Take 2 tablets (40 mg total) by mouth every morning AND 2 tablets (40 mg total) every evening. 02/23/23 05/24/23 Yes Bensimhon, Bevelyn Buckles, MD  triamcinolone cream (KENALOG) 0.1 % Apply 1 Application topically 2 (two) times daily. 10/09/22  Yes [provider]  ibuprofen (ADVIL) 200 MG tablet Take 800 mg by mouth every 8 (eight) hours as needed for headache or moderate pain.    [provider]    ___________________________________________________________________________________________________ Physical Exam:    03/28/2023   11:15 PM 03/28/2023   10:45 PM 03/28/2023   10:30 PM  Vitals with BMI  Systolic 87 88 96  Diastolic 70 67 80  Pulse 85 85 88     1. General:  in No  Acute distress    Chronically ill  -appearing 2. Psychological: Alert and   Oriented 3. Head/ENT:   Moist  Mucous Membranes                          Head Non traumatic, neck supple                          Poor Dentition 4. SKIN:  decreased Skin turgor,  Skin clean Dry and intact no rash    5. Heart: Regular rate and rhythm no*** Murmur, no Rub or gallop 6. Lungs: , no wheezes or crackles   7. Abdomen: Soft, ***non-tender, Non distended *** obese ***bowel sounds present 8. Lower extremities: no clubbing, cyanosis, no ***edema 9. Neurologically Grossly intact, moving all 4 extremities equally  10. MSK: Normal range of motion    Chart has been reviewed  ______________________________________________________________________________________________  Assessment/Plan 52 y.o. male with medical history significant of Systolic CHF EF 25-35%,CAD, HTN, GERD, gout, HLD, OSA, obesity, RBBB, DM2 Admitted for  syncope   Present on Admission:  Syncope     No problem-specific Assessment & Plan notes found for this encounter.    Other plan as per orders.  DVT prophylaxis:  SCD      Code Status:    Code Status: Prior FULL CODE *** DNR/DNI ***comfort care as per patient ***family  I had personally discussed CODE STATUS with patient and family*  ACP *** none has been reviewed ***   Family Communication:   Family not at  Bedside  plan of care was discussed on the phone with *** Son, Daughter, Wife, Husband, Sister, Brother , father, mother  Diet    Disposition Plan:   *** likely will need placement for rehabilitation                          Back to current facility when stable  To home once workup is complete and patient is stable  ***Following barriers for discharge:                             Chest pain *** Stroke *** work up is complete                            Electrolytes corrected                               Anemia corrected h/H stable                             Pain controlled with PO medications                               Afebrile, white count improving able to transition to PO antibiotics                             Will need to be able to tolerate PO                            Will likely need home health, home O2, set up                           Will need consultants to evaluate patient prior to discharge                        ***Would benefit from PT/OT eval prior to DC  Ordered                   Swallow eval - SLP ordered                   Diabetes care coordinator                   Transition of care consulted                   Nutrition    consulted                  Wound care  consulted                   Palliative care    consulted                   Behavioral health  consulted                    Consults called: ***  emailed Cardiology    Admission status:  ED Disposition     ED Disposition  Admit   Condition  --    Comment  Hospital [facility]: [facility] [[id number]]  Level of Care: Progressive [102]  Admit to Progressive based on following criteria: CARDIOVASCULAR & THORACIC of moderate stability with acute coronary syndrome symptoms/low risk myocardial infarction/hypertensive urgency/arrhythmias/heart failure potentially compromising stability and stable post cardiovascular intervention patients.  May place patient in observation at [facility] or [facility] if equivalent level of care is available::  No  Covid Evaluation: Asymptomatic - no recent exposure (last 10 days) testing not required  Diagnosis: Syncope [206001]  Admitting Physician: Therisa Doyne [[id number]]  Attending Physician: Therisa Doyne [[id number]]           Obs     Level of care         progressive     stepdown   tele indefinitely please discontinue once patient no longer qualifies COVID-19 Labs     Zineb Glade 03/28/2023, 11:27 PM ***  [facility]     after 2 AM please page floor coverage PA If 7AM-7PM, please contact the day team taking care of the patient using [email or web address]

## 2023-03-28 NOTE — ED Notes (Signed)
Pacemaker interrogated. 

## 2023-03-28 NOTE — ED Provider Notes (Signed)
[facility] 4E CV SURGICAL PROGRESSIVE CARE Provider Note  CSN: [id number] Arrival date & time: 03/28/23 2141  Chief Complaint(s) Loss of Consciousness  HPI Joe [name] is a 52 y.o. male with a history of ischemic cardiomyopathy with severe CHF, ICD in place, hypertension, diabetes, obesity, referred to [facility] for transplant but not actively on transplant list presenting to the emergency department with syncopal event.  Patient reports that he had an episode where he felt very warm and then lost consciousness for around 2 minutes, as reported by wife.  He thinks he was totally unconscious.  Did not have any chest pain, palpitations, abdominal pain.  Reports currently feels at baseline.  Denies any similar episode of syncope, did have 1 episode of "feeling warm" earlier but did not result in a syncopal episode.  Did not have any pacemaker shocks.  No shortness of breath.  Did have some increased weight today and took an extra dose of diuretic.   Past Medical History Past Medical History:  Diagnosis Date   Abnormal EKG    Acute systolic heart failure (HCC) 09/06/2017   Angina pectoris (HCC) 09/05/2017   Body mass index 45.0-49.9, adult (HCC)    CAD S/P percutaneous coronary angioplasty 09/07/2017   mLAD and dLAD PCI with DES 09/06/17   CHF (congestive heart failure) (HCC)    Chronic systolic (congestive) heart failure (HCC) 03/19/2019   Complication of anesthesia 1995   "had hard time waking up; breathing w/vasectomy"   Coronary artery disease    Erectile dysfunction    Essential hypertension, benign    Fatigue    GERD (gastroesophageal reflux disease)    Gout    High cholesterol    "just started tx yesterday" (09/06/2017)   History of gout    "RX prn" (09/06/2017)   Ischemic cardiomyopathy 09/07/2017   EF 25-35%   Muscular chest pain    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   Below Left Nare (MOH's)   Obesity    Obstructive sleep apnea    OSA on CPAP    Plantar fasciitis     Pre-operative clearance 02/11/2018   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/02/2019   [facility]   Right bundle branch block    Shortness of breath 09/05/2017   Testosterone deficiency    Type 2 diabetes mellitus with complication, without long-term current use of insulin (HCC) 09/05/2017   Type II diabetes mellitus (HCC)    "started tx 08/28/2017"   Patient Active Problem List   Diagnosis Date Noted   Hypokalemia 03/29/2023   Prolonged QT interval 03/29/2023   Syncope 03/28/2023   Unstable angina (HCC) 11/27/2021   Palpitations 03/04/2021   Chest pain 03/04/2021   Body mass index 45.0-49.9, adult (HCC)    CHF (congestive heart failure) (HCC)    Fatigue    Hyperlipidemia    History of gout    Muscular chest pain    OSA on CPAP    Plantar fasciitis    Type II diabetes mellitus (HCC)    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/02/2019   Chronic systolic CHF (congestive heart failure) (HCC) 03/19/2019   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   Coronary artery disease    Pre-operative clearance 02/11/2018   CAD S/P percutaneous coronary angioplasty 09/07/2017   Ischemic cardiomyopathy 09/07/2017   Acute systolic heart failure (HCC) 09/06/2017   Angina pectoris (HCC) 09/05/2017   Shortness of breath 09/05/2017   Type 2 diabetes mellitus with complication,  without long-term current use of insulin (HCC) 09/05/2017   Carpal tunnel syndrome of right wrist 08/27/2015   Left hand pain 08/16/2015   Carpal tunnel syndrome of left wrist 08/16/2015   Obstructive sleep apnea    GERD (gastroesophageal reflux disease)    Erectile dysfunction    Essential hypertension, benign    Obesity    Testosterone deficiency    Gout    Abnormal EKG    Right bundle branch block    Complication of anesthesia 1995   Home Medication(s) Prior to Admission medications   Medication Sig Start Date End Date Taking? Authorizing Provider   acetaminophen (TYLENOL) 500 MG tablet Take 1,000 mg by mouth every 6 (six) hours as needed for moderate pain or headache.   Yes [provider]  allopurinol (ZYLOPRIM) 100 MG tablet Take 100 mg by mouth daily.   Yes [provider]  Aromatic Inhalants (VICKS VAPOINHALER) INHA Inhale 1 puff into the lungs daily as needed (congestion).   Yes [provider]  aspirin EC 81 MG tablet Take 1 tablet (81 mg total) by mouth daily. 09/05/17  Yes Georgeanna Lea, MD  colchicine 0.6 MG tablet Take 0.6 mg by mouth daily as needed (Gout). 05/10/20  Yes [provider]  diclofenac Sodium (VOLTAREN) 1 % GEL APPLY 1 G TOPICALLY DAILY. Patient taking differently: Apply 2 g topically daily as needed (for pain). 01/11/23  Yes Standiford, Jenelle Mages, DPM  empagliflozin (JARDIANCE) 10 MG TABS tablet Take 1 tablet (10 mg total) by mouth daily before breakfast. 02/23/23  Yes Andrey Farmer, PA-C  escitalopram (LEXAPRO) 10 MG tablet Take 10 mg by mouth at bedtime. 06/24/21  Yes [provider]  gabapentin (NEURONTIN) 100 MG capsule Take 100-200 mg by mouth See admin instructions. Take 2 capsules by mouth in the morning and 1 capsule at bedtime or vice versa 11/09/21  Yes [provider]  isosorbide mononitrate (IMDUR) 30 MG 24 hr tablet TAKE 1 TABLET BY MOUTH EVERY DAY 10/02/22  Yes Georgeanna Lea, MD  metolazone (ZAROXOLYN) 2.5 MG tablet Take 1 tablet (2.5 mg total) by mouth daily. Take one tablet (2.5 mg) once weekly.  Take extra potassium (20 meq) when  you take metolazone Patient taking differently: Take 2.5 mg by mouth See admin instructions. Take one tablet (2.5 mg) once weekly on Saturday.  May take 1 tablet during the week for weight gain. Take extra potassium (20 meq) when  you take metolazone 02/23/23  Yes Bensimhon, Bevelyn Buckles, MD  metoprolol succinate (TOPROL-XL) 25 MG 24 hr tablet Take 0.5 tablets (12.5 mg total) by mouth daily. Take with or immediately  following a meal. 03/12/23  Yes Milford, Anderson Malta, FNP  mexiletine (MEXITIL) 250 MG capsule Take 1 capsule (250 mg total) by mouth 2 (two) times daily. 12/18/22  Yes Camnitz, Andree Coss, MD  nitroGLYCERIN (NITROSTAT) 0.4 MG SL tablet Place 1 tablet (0.4 mg total) under the tongue every 5 (five) minutes as needed for chest pain. 03/16/23  Yes Milford, Anderson Malta, FNP  omeprazole (PRILOSEC) 40 MG capsule Take 1 capsule (40 mg total) by mouth daily. 01/14/19  Yes Georgeanna Lea, MD  OZEMPIC, 1 MG/DOSE, 4 MG/3ML SOPN Inject 1 mg into the skin every Monday. 12/28/22  Yes [provider]  Polyethylene Glycol 400 (BLINK TEARS OP) Place 2 drops into both eyes 4 (four) times daily as needed (for dry eyes).   Yes [provider]  potassium chloride SA (KLOR-CON M) 20  MEQ tablet Take 2 tablets (40 mEq total) by mouth every morning AND 1 tablet (20 mEq total) every evening. On days you take metolazone take 40 meq of potassium in the morning and 40 in the evening.. Patient taking differently: Take 2 tablets (40 mEq total) by mouth every morning AND 2 tablet (40 mEq total) every evening. On days you take metolazone take 40 meq of potassium in the morning and 40 in the evening.. 02/23/23  Yes Bensimhon, Bevelyn Buckles, MD  ranolazine (RANEXA) 500 MG 12 hr tablet Take 1 tablet (500 mg total) by mouth 2 (two) times daily. 07/18/22  Yes Georgeanna Lea, MD  rosuvastatin (CRESTOR) 40 MG tablet Take 1 tablet (40 mg total) by mouth daily. 07/18/22  Yes Georgeanna Lea, MD  spironolactone (ALDACTONE) 25 MG tablet Take 1 tablet (25 mg total) by mouth daily. 11/20/22 11/15/23 Yes Milford, Anderson Malta, FNP  torsemide (DEMADEX) 20 MG tablet Take 2 tablets (40 mg total) by mouth every morning AND 2 tablets (40 mg total) every evening. 02/23/23 05/24/23 Yes Bensimhon, Bevelyn Buckles, MD  triamcinolone cream (KENALOG) 0.1 % Apply 1 Application topically 2 (two) times daily. 10/09/22  Yes [provider]  ibuprofen (ADVIL)  200 MG tablet Take 800 mg by mouth every 8 (eight) hours as needed for headache or moderate pain.    [provider]                                                                                                                                    Past Surgical History Past Surgical History:  Procedure Laterality Date   BACK SURGERY     BIV ICD INSERTION CRT-D N/A 03/19/2019   Procedure: BIV ICD INSERTION CRT-D;  Surgeon: Regan Lemming, MD;  Location: [facility] INVASIVE CV LAB;  Service: Cardiovascular;  Laterality: N/A;   CORONARY ANGIOPLASTY WITH STENT PLACEMENT  09/06/2017   "3 stents"   CORONARY STENT INTERVENTION N/A 09/06/2017   Procedure: CORONARY STENT INTERVENTION;  Surgeon: Corky Crafts, MD;  Location: [facility] INVASIVE CV LAB;  Service: Cardiovascular;  Laterality: N/A;   CORONARY STENT INTERVENTION N/A 02/15/2018   Procedure: CORONARY STENT INTERVENTION;  Surgeon: Corky Crafts, MD;  Location: [facility] INVASIVE CV LAB;  Service: Cardiovascular;  Laterality: N/A;   KNEE ARTHROSCOPY Right    LEFT HEART CATH AND CORONARY ANGIOGRAPHY N/A 09/06/2017   Procedure: LEFT HEART CATH AND CORONARY ANGIOGRAPHY;  Surgeon: Corky Crafts, MD;  Location: [facility] INVASIVE CV LAB;  Service: Cardiovascular;  Laterality: N/A;   LEFT HEART CATH AND CORONARY ANGIOGRAPHY N/A 02/15/2018   Procedure: LEFT HEART CATH AND CORONARY ANGIOGRAPHY;  Surgeon: Corky Crafts, MD;  Location: [facility] INVASIVE CV LAB;  Service: Cardiovascular;  Laterality: N/A;   LEFT HEART CATH AND CORONARY ANGIOGRAPHY N/A 06/27/2018   Procedure: LEFT HEART CATH AND CORONARY ANGIOGRAPHY;  Surgeon: Corky Crafts, MD;  Location: [facility] INVASIVE CV  LAB;  Service: Cardiovascular;  Laterality: N/A;   LEFT HEART CATH AND CORONARY ANGIOGRAPHY N/A 06/28/2020   Procedure: LEFT HEART CATH AND CORONARY ANGIOGRAPHY;  Surgeon: Swaziland, Peter M, MD;  Location: [facility] INVASIVE CV LAB;  Service: Cardiovascular;  Laterality: N/A;   LUMBAR SPINE  SURGERY  ~ 2001   Intradiscal Electrothermoplasty   RIGHT/LEFT HEART CATH AND CORONARY ANGIOGRAPHY N/A 03/07/2021   Procedure: RIGHT/LEFT HEART CATH AND CORONARY ANGIOGRAPHY;  Surgeon: Laurey Morale, MD;  Location: [facility] INVASIVE CV LAB;  Service: Cardiovascular;  Laterality: N/A;   RIGHT/LEFT HEART CATH AND CORONARY ANGIOGRAPHY N/A 02/23/2023   Procedure: RIGHT/LEFT HEART CATH AND CORONARY ANGIOGRAPHY;  Surgeon: Dolores Patty, MD;  Location: [facility] INVASIVE CV LAB;  Service: Cardiovascular;  Laterality: N/A;   VASECTOMY  1995   Family History Family History  Problem Relation Age of Onset   Lymphoma Father    Diabetes Mellitus II Brother    Diabetes Mellitus II Paternal Grandfather    Lung cancer Mother    Brain cancer Mother     Social History Social History   Tobacco Use   Smoking status: Never   Smokeless tobacco: Never  Vaping Use   Vaping status: Never Used  Substance Use Topics   Alcohol use: No   Drug use: Never   Allergies Patient has no known allergies.  Review of Systems Review of Systems  All other systems reviewed and are negative.   Physical Exam Vital Signs  I have reviewed the triage vital signs BP 94/72 (BP Location: Left Arm)   Pulse 92   Temp (!) 97.5 F (36.4 C) (Oral)   Resp 17   Ht 6\' 3"  (1.905 m)   Wt (!) 139.6 kg   SpO2 94%   BMI 38.47 kg/m  Physical Exam Vitals and nursing note reviewed.  Constitutional:      General: He is not in acute distress.    Appearance: Normal appearance. He is obese.  HENT:     Mouth/Throat:     Mouth: Mucous membranes are moist.  Eyes:     Conjunctiva/sclera: Conjunctivae normal.  Cardiovascular:     Rate and Rhythm: Normal rate and regular rhythm.  Pulmonary:     Effort: Pulmonary effort is normal. No respiratory distress.     Breath sounds: Normal breath sounds.  Abdominal:     General: Abdomen is flat.     Palpations: Abdomen is soft.     Tenderness: There is no abdominal tenderness.   Musculoskeletal:     Right lower leg: Edema present.     Left lower leg: Edema present.     Comments: Trace b/l edema   Skin:    General: Skin is warm and dry.     Capillary Refill: Capillary refill takes less than 2 seconds.  Neurological:     Mental Status: He is alert and oriented to person, place, and time. Mental status is at baseline.  Psychiatric:        Mood and Affect: Mood normal.        Behavior: Behavior normal.     ED Results and Treatments Labs (all labs ordered are listed, but only abnormal results are displayed) Labs Reviewed  COMPREHENSIVE METABOLIC PANEL - Abnormal; Notable for the following components:      Result Value   Sodium 134 (*)    Potassium 2.5 (*)    Chloride 94 (*)    Glucose, Bld 206 (*)    All other components within normal  limits  BRAIN NATRIURETIC PEPTIDE - Abnormal; Notable for the following components:   B Natriuretic Peptide 178.3 (*)    All other components within normal limits  BLOOD GAS, VENOUS - Abnormal; Notable for the following components:   pH, Ven 7.48 (*)    pO2, Ven 60 (*)    Bicarbonate 35.0 (*)    Acid-Base Excess 10.0 (*)    All other components within normal limits  PHOSPHORUS - Abnormal; Notable for the following components:   Phosphorus 5.1 (*)    All other components within normal limits  HEMOGLOBIN A1C - Abnormal; Notable for the following components:   Hgb A1c MFr Bld 7.5 (*)    All other components within normal limits  PHOSPHORUS - Abnormal; Notable for the following components:   Phosphorus 6.0 (*)    All other components within normal limits  COMPREHENSIVE METABOLIC PANEL - Abnormal; Notable for the following components:   Sodium 134 (*)    Potassium 3.2 (*)    Chloride 94 (*)    Glucose, Bld 139 (*)    Calcium 8.6 (*)    All other components within normal limits  BASIC METABOLIC PANEL - Abnormal; Notable for the following components:   Chloride 91 (*)    Glucose, Bld 146 (*)    Creatinine, Ser 1.26  (*)    All other components within normal limits  GLUCOSE, CAPILLARY - Abnormal; Notable for the following components:   Glucose-Capillary 171 (*)    All other components within normal limits  PHOSPHORUS - Abnormal; Notable for the following components:   Phosphorus 5.5 (*)    All other components within normal limits  GLUCOSE, CAPILLARY - Abnormal; Notable for the following components:   Glucose-Capillary 141 (*)    All other components within normal limits  GLUCOSE, CAPILLARY - Abnormal; Notable for the following components:   Glucose-Capillary 179 (*)    All other components within normal limits  GLUCOSE, CAPILLARY - Abnormal; Notable for the following components:   Glucose-Capillary 146 (*)    All other components within normal limits  GLUCOSE, CAPILLARY - Abnormal; Notable for the following components:   Glucose-Capillary 188 (*)    All other components within normal limits  GLUCOSE, CAPILLARY - Abnormal; Notable for the following components:   Glucose-Capillary 195 (*)    All other components within normal limits  GLUCOSE, CAPILLARY - Abnormal; Notable for the following components:   Glucose-Capillary 186 (*)    All other components within normal limits  GLUCOSE, CAPILLARY - Abnormal; Notable for the following components:   Glucose-Capillary 129 (*)    All other components within normal limits  CBG MONITORING, ED - Abnormal; Notable for the following components:   Glucose-Capillary 204 (*)    All other components within normal limits  CBG MONITORING, ED - Abnormal; Notable for the following components:   Glucose-Capillary 143 (*)    All other components within normal limits  CBG MONITORING, ED - Abnormal; Notable for the following components:   Glucose-Capillary 136 (*)    All other components within normal limits  CBG MONITORING, ED - Abnormal; Notable for the following components:   Glucose-Capillary 164 (*)    All other components within normal limits  CBG  MONITORING, ED - Abnormal; Notable for the following components:   Glucose-Capillary 206 (*)    All other components within normal limits  TROPONIN I (HIGH SENSITIVITY) - Abnormal; Notable for the following components:   Troponin I (High Sensitivity) 24 (*)  All other components within normal limits  TROPONIN I (HIGH SENSITIVITY) - Abnormal; Notable for the following components:   Troponin I (High Sensitivity) 30 (*)    All other components within normal limits  TROPONIN I (HIGH SENSITIVITY) - Abnormal; Notable for the following components:   Troponin I (High Sensitivity) 30 (*)    All other components within normal limits  CBC WITH DIFFERENTIAL/PLATELET  MAGNESIUM  LACTIC ACID, PLASMA  LACTIC ACID, PLASMA  PROCALCITONIN  D-DIMER, QUANTITATIVE  CK  PREALBUMIN  TSH  MAGNESIUM  HIV ANTIBODY (ROUTINE TESTING W REFLEX)  MAGNESIUM  CBC  CBC  MAGNESIUM  CBC  MAGNESIUM  BASIC METABOLIC PANEL  PHOSPHORUS                                                                                                                          Radiology No results found.  Pertinent labs & imaging results that were available during my care of the patient were reviewed by me and considered in my medical decision making (see MDM for details).  Medications Ordered in ED Medications  insulin aspart (novoLOG) injection 0-9 Units (1 Units Subcutaneous Given 03/30/23 1633)  allopurinol (ZYLOPRIM) tablet 100 mg (100 mg Oral Given 03/30/23 0853)  aspirin EC tablet 81 mg (81 mg Oral Given 03/30/23 0853)  mexiletine (MEXITIL) capsule 250 mg (250 mg Oral Given 03/30/23 0853)  pantoprazole (PROTONIX) EC tablet 40 mg (40 mg Oral Given 03/30/23 0853)  ranolazine (RANEXA) 12 hr tablet 500 mg (500 mg Oral Given 03/30/23 0853)  rosuvastatin (CRESTOR) tablet 40 mg (40 mg Oral Given 03/30/23 0853)  acetaminophen (TYLENOL) tablet 650 mg (650 mg Oral Given 03/29/23 1132)    Or  acetaminophen (TYLENOL) suppository 650  mg ( Rectal See Alternative 03/29/23 1132)  HYDROcodone-acetaminophen (NORCO/VICODIN) 5-325 MG per tablet 1-2 tablet (has no administration in time range)  enoxaparin (LOVENOX) injection 40 mg (40 mg Subcutaneous Given 03/30/23 1410)  sodium chloride flush (NS) 0.9 % injection 3 mL (3 mLs Intravenous Given 03/30/23 0854)  sodium chloride flush (NS) 0.9 % injection 3 mL (has no administration in time range)  0.9 %  sodium chloride infusion (has no administration in time range)  gabapentin (NEURONTIN) capsule 200 mg (200 mg Oral Given 03/30/23 0853)    And  gabapentin (NEURONTIN) capsule 100 mg (100 mg Oral Given 03/29/23 2156)  amiodarone (NEXTERONE) 1.8 mg/mL load via infusion 150 mg (150 mg Intravenous Bolus from Bag 03/29/23 0915)    Followed by  amiodarone (NEXTERONE PREMIX) 360-4.14 MG/200ML-% (1.8 mg/mL) IV infusion (0 mg/hr Intravenous Stopped 03/29/23 1411)    Followed by  amiodarone (NEXTERONE PREMIX) 360-4.14 MG/200ML-% (1.8 mg/mL) IV infusion (30 mg/hr Intravenous New Bag/Given 03/30/23 1008)  torsemide (DEMADEX) tablet 40 mg (40 mg Oral Given 03/30/23 1634)  potassium chloride SA (KLOR-CON M) CR tablet 40 mEq (40 mEq Oral Given 03/30/23 0853)  Oral care mouth rinse (has no administration in time range)  potassium chloride SA (KLOR-CON M)  CR tablet 60 mEq (60 mEq Oral Given 03/28/23 2332)  potassium chloride 10 mEq in 100 mL IVPB (0 mEq Intravenous Stopped 03/29/23 0706)  potassium chloride (KLOR-CON) packet 40 mEq (40 mEq Oral Given 03/29/23 0728)  prochlorperazine (COMPAZINE) injection 10 mg (10 mg Intravenous Given 03/30/23 1234)                                                                                                                                     Procedures Procedures  (including critical care time)  Medical Decision Making / ED Course   MDM:  52 year old male presenting to the emergency department with syncopal event.  Patient currently feels at baseline.   Vital signs are reassuring.  Physical exam with only trace lower extremity edema.  Given patient's degree of chronic systolic CHF with end-stage CHF concern for possible cardiac cause.  Will interrogate his pacemaker/ICD but was not shocked as far as he is aware.  Will probably need admission for observation even if his pacemaker does not show any specific abnormalities. No chest pain, shortness of breath to suggest PE or ACS. No clear trigger to suggest an orthostatic or vasovagal episode.  No clear postictal state or seizure activity to suggest seizure.  No headaches to suggest any intracranial process leading to his syncope.  Will reassess.  Will check labs.      Additional history obtained: -Additional history obtained from family and ems -External records from outside source obtained and reviewed including: Chart review including previous notes, labs, imaging, consultation notes including prior cardiology notres   Lab Tests: -I ordered, reviewed, and interpreted labs.   The pertinent results include:   Labs Reviewed  COMPREHENSIVE METABOLIC PANEL - Abnormal; Notable for the following components:      Result Value   Sodium 134 (*)    Potassium 2.5 (*)    Chloride 94 (*)    Glucose, Bld 206 (*)    All other components within normal limits  BRAIN NATRIURETIC PEPTIDE - Abnormal; Notable for the following components:   B Natriuretic Peptide 178.3 (*)    All other components within normal limits  BLOOD GAS, VENOUS - Abnormal; Notable for the following components:   pH, Ven 7.48 (*)    pO2, Ven 60 (*)    Bicarbonate 35.0 (*)    Acid-Base Excess 10.0 (*)    All other components within normal limits  PHOSPHORUS - Abnormal; Notable for the following components:   Phosphorus 5.1 (*)    All other components within normal limits  HEMOGLOBIN A1C - Abnormal; Notable for the following components:   Hgb A1c MFr Bld 7.5 (*)    All other components within normal limits  PHOSPHORUS - Abnormal;  Notable for the following components:   Phosphorus 6.0 (*)    All other components within normal limits  COMPREHENSIVE METABOLIC PANEL - Abnormal; Notable for the following components:   Sodium 134 (*)  Potassium 3.2 (*)    Chloride 94 (*)    Glucose, Bld 139 (*)    Calcium 8.6 (*)    All other components within normal limits  BASIC METABOLIC PANEL - Abnormal; Notable for the following components:   Chloride 91 (*)    Glucose, Bld 146 (*)    Creatinine, Ser 1.26 (*)    All other components within normal limits  GLUCOSE, CAPILLARY - Abnormal; Notable for the following components:   Glucose-Capillary 171 (*)    All other components within normal limits  PHOSPHORUS - Abnormal; Notable for the following components:   Phosphorus 5.5 (*)    All other components within normal limits  GLUCOSE, CAPILLARY - Abnormal; Notable for the following components:   Glucose-Capillary 141 (*)    All other components within normal limits  GLUCOSE, CAPILLARY - Abnormal; Notable for the following components:   Glucose-Capillary 179 (*)    All other components within normal limits  GLUCOSE, CAPILLARY - Abnormal; Notable for the following components:   Glucose-Capillary 146 (*)    All other components within normal limits  GLUCOSE, CAPILLARY - Abnormal; Notable for the following components:   Glucose-Capillary 188 (*)    All other components within normal limits  GLUCOSE, CAPILLARY - Abnormal; Notable for the following components:   Glucose-Capillary 195 (*)    All other components within normal limits  GLUCOSE, CAPILLARY - Abnormal; Notable for the following components:   Glucose-Capillary 186 (*)    All other components within normal limits  GLUCOSE, CAPILLARY - Abnormal; Notable for the following components:   Glucose-Capillary 129 (*)    All other components within normal limits  CBG MONITORING, ED - Abnormal; Notable for the following components:   Glucose-Capillary 204 (*)    All other  components within normal limits  CBG MONITORING, ED - Abnormal; Notable for the following components:   Glucose-Capillary 143 (*)    All other components within normal limits  CBG MONITORING, ED - Abnormal; Notable for the following components:   Glucose-Capillary 136 (*)    All other components within normal limits  CBG MONITORING, ED - Abnormal; Notable for the following components:   Glucose-Capillary 164 (*)    All other components within normal limits  CBG MONITORING, ED - Abnormal; Notable for the following components:   Glucose-Capillary 206 (*)    All other components within normal limits  TROPONIN I (HIGH SENSITIVITY) - Abnormal; Notable for the following components:   Troponin I (High Sensitivity) 24 (*)    All other components within normal limits  TROPONIN I (HIGH SENSITIVITY) - Abnormal; Notable for the following components:   Troponin I (High Sensitivity) 30 (*)    All other components within normal limits  TROPONIN I (HIGH SENSITIVITY) - Abnormal; Notable for the following components:   Troponin I (High Sensitivity) 30 (*)    All other components within normal limits  CBC WITH DIFFERENTIAL/PLATELET  MAGNESIUM  LACTIC ACID, PLASMA  LACTIC ACID, PLASMA  PROCALCITONIN  D-DIMER, QUANTITATIVE  CK  PREALBUMIN  TSH  MAGNESIUM  HIV ANTIBODY (ROUTINE TESTING W REFLEX)  MAGNESIUM  CBC  CBC  MAGNESIUM  CBC  MAGNESIUM  BASIC METABOLIC PANEL  PHOSPHORUS    Notable for mild BNP elevation  EKG   EKG Interpretation Date/Time:  Wednesday March 28 2023 21:46:24 EDT Ventricular Rate:  103 PR Interval:  156 QRS Duration:  150 QT Interval:  463 QTC Calculation: 595 R Axis:   -89  Text Interpretation: VENTRICULAR  PACED RHYTHM Paired ventricular premature complexes Compared to previous tracing Premature ventricular complexes are present Confirmed by Alvino Blood ([id number]) on 03/28/2023 10:07:02 PM         Imaging Studies ordered: I ordered imaging studies  including CXR On my interpretation imaging demonstrates no acute process I independently visualized and interpreted imaging. I agree with the radiologist interpretation   Medicines ordered and prescription drug management: Meds ordered this encounter  Medications   potassium chloride SA (KLOR-CON M) CR tablet 60 mEq   potassium chloride 10 mEq in 100 mL IVPB   insulin aspart (novoLOG) injection 0-9 Units    Order Specific Question:   Correction coverage:    Answer:   Sensitive (thin, NPO, renal)    Order Specific Question:   CBG < 70:    Answer:   Implement Hypoglycemia Standing Orders and refer to Hypoglycemia Standing Orders sidebar report    Order Specific Question:   CBG 70 - 120:    Answer:   0 units    Order Specific Question:   CBG 121 - 150:    Answer:   1 unit    Order Specific Question:   CBG 151 - 200:    Answer:   2 units    Order Specific Question:   CBG 201 - 250:    Answer:   3 units    Order Specific Question:   CBG 251 - 300:    Answer:   5 units    Order Specific Question:   CBG 301 - 350:    Answer:   7 units    Order Specific Question:   CBG 351 - 400    Answer:   9 units    Order Specific Question:   CBG > 400    Answer:   call MD and obtain STAT lab verification   allopurinol (ZYLOPRIM) tablet 100 mg   aspirin EC tablet 81 mg   DISCONTD: gabapentin (NEURONTIN) capsule 100-200 mg    Take 2 capsules by mouth in the morning and 1 capsule at bedtime or vice versa     mexiletine (MEXITIL) capsule 250 mg   pantoprazole (PROTONIX) EC tablet 40 mg   ranolazine (RANEXA) 12 hr tablet 500 mg   rosuvastatin (CRESTOR) tablet 40 mg   OR Linked Order Group    acetaminophen (TYLENOL) tablet 650 mg    acetaminophen (TYLENOL) suppository 650 mg   HYDROcodone-acetaminophen (NORCO/VICODIN) 5-325 MG per tablet 1-2 tablet   enoxaparin (LOVENOX) injection 40 mg   sodium chloride flush (NS) 0.9 % injection 3 mL   sodium chloride flush (NS) 0.9 % injection 3 mL   0.9 %   sodium chloride infusion   DISCONTD: gabapentin (NEURONTIN) capsule 200 mg   DISCONTD: gabapentin (NEURONTIN) capsule 100 mg   AND Linked Order Group    gabapentin (NEURONTIN) capsule 100 mg    gabapentin (NEURONTIN) capsule 200 mg   potassium chloride (KLOR-CON) packet 40 mEq   FOLLOWED BY Linked Order Group    amiodarone (NEXTERONE) 1.8 mg/mL load via infusion 150 mg    amiodarone (NEXTERONE PREMIX) 360-4.14 MG/200ML-% (1.8 mg/mL) IV infusion    amiodarone (NEXTERONE PREMIX) 360-4.14 MG/200ML-% (1.8 mg/mL) IV infusion   torsemide (DEMADEX) tablet 40 mg   DISCONTD: potassium chloride SA (KLOR-CON M) CR tablet 40 mEq   potassium chloride SA (KLOR-CON M) CR tablet 40 mEq   Oral care mouth rinse   DISCONTD: ondansetron (ZOFRAN) injection 4 mg   prochlorperazine (COMPAZINE) injection  10 mg    -I have reviewed the patients home medicines and have made adjustments as needed   Consultations Obtained: I requested consultation with the hospitalist,  and discussed lab and imaging findings as well as pertinent plan - they recommend: admit   Cardiac Monitoring: The patient was maintained on a cardiac monitor.  I personally viewed and interpreted the cardiac monitored which showed an underlying rhythm of: paced rhythm   Social Determinants of Health:  Diagnosis or treatment significantly limited by social determinants of health: obesity   Reevaluation: After the interventions noted above, I reevaluated the patient and found that their symptoms have improved  Co morbidities that complicate the patient evaluation  Past Medical History:  Diagnosis Date   Abnormal EKG    Acute systolic heart failure (HCC) 09/06/2017   Angina pectoris (HCC) 09/05/2017   Body mass index 45.0-49.9, adult (HCC)    CAD S/P percutaneous coronary angioplasty 09/07/2017   mLAD and dLAD PCI with DES 09/06/17   CHF (congestive heart failure) (HCC)    Chronic systolic (congestive) heart failure (HCC) 03/19/2019    Complication of anesthesia 1995   "had hard time waking up; breathing w/vasectomy"   Coronary artery disease    Erectile dysfunction    Essential hypertension, benign    Fatigue    GERD (gastroesophageal reflux disease)    Gout    High cholesterol    "just started tx yesterday" (09/06/2017)   History of gout    "RX prn" (09/06/2017)   Ischemic cardiomyopathy 09/07/2017   EF 25-35%   Muscular chest pain    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   Below Left Nare (MOH's)   Obesity    Obstructive sleep apnea    OSA on CPAP    Plantar fasciitis    Pre-operative clearance 02/11/2018   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/02/2019   [employer]   Right bundle branch block    Shortness of breath 09/05/2017   Testosterone deficiency    Type 2 diabetes mellitus with complication, without long-term current use of insulin (HCC) 09/05/2017   Type II diabetes mellitus (HCC)    "started tx 08/28/2017"      Dispostion: Disposition decision including need for hospitalization was considered, and patient admitted to the hospital.    Final Clinical Impression(s) / ED Diagnoses Final diagnoses:  Syncope and collapse     This chart was dictated using voice recognition software.  Despite best efforts to proofread,  errors can occur which can change the documentation meaning.    Lonell Grandchild, MD 03/30/23 1900

## 2023-03-28 NOTE — ED Notes (Signed)
Pt's BP was noted to be trending down. [place] notified face to face.

## 2023-03-28 NOTE — ED Triage Notes (Addendum)
Pt to ED BIB EMS from home with complaint of syncopal episode lasting approx 2 minutes. Patient does not recall episode - does recall wife shaking him upon waking. Reports feeling hot, nauseous, dizzy, and numbness in hands before syncopal episode. Pt endorsing mild numbness is bilateral arms but otherwise symptoms have resolved.   Hx of heart failure - has appointment with [facility] to be placed on heart transplant list

## 2023-03-28 NOTE — H&P (Incomplete)
Joe [name] ZOX:[id number] DOB: 1971/04/11 DOA: 03/28/2023      PCP: Joe Peak, PA-C   Outpatient Specialists:   CARDS: Dr. [name], MD HF Cardiologist: Joe [name]   Patient arrived to ER on 03/28/23 at 2141 Referred by Attending Joe Grandchild, MD   Patient coming from:    home Lives With family     Chief Complaint:   Chief Complaint  Patient presents with  . Loss of Consciousness    HPI: Joe [name] is a 52 y.o. male with medical history significant of Systolic CHF EF 25-35%,CAD, HTN, GERD, gout, HLD, OSA, obesity, RBBB, DM2,     Presented with   syncope Pt is coming form home w Syncope for about 2 min Found by wife Pt does not remember  Associated with feeling hot, nauseous and dizzy, arms and held felt numb prior to syncope Hx of heart failure followed by [facility] No CP, now back to baseline,  AICD did not fire  He did take an additional dose of lasix today   He did have one margarita at supper Had an extra dose of metolazone today   Denies significant ETOH intake   Does not smoke   Lab Results  Component Value Date   SARSCOV2NAA NEGATIVE 03/04/2021   SARSCOV2NAA NEGATIVE 06/25/2020   SARSCOV2NAA NOT DETECTED 03/15/2019        Regarding pertinent Chronic problems:     Hyperlipidemia -  on statins Crestor Lipid Panel     Component Value Date/Time   CHOL 172 11/28/2021 0030   CHOL 103 03/21/2021 0923   TRIG 244 (H) 11/28/2021 0030   HDL 27 (L) 11/28/2021 0030   HDL 31 (L) 03/21/2021 0923   CHOLHDL 6.4 11/28/2021 0030   VLDL 49 (H) 11/28/2021 0030   LDLCALC 96 11/28/2021 0030   LDLCALC 34 03/21/2021 0923   LABVLDL 38 03/21/2021 0923     HTN on imdur, toprol   chronic CHF diastolic/systolic/ combined -  On Metolazone, Demadex last echo  Recent Results (from the past 81191 hour(s))  ECHOCARDIOGRAM COMPLETE   Collection Time: 02/15/23 12:00 PM  Result Value   S' Lateral 7.00   Est EF < 20%   Narrative       ECHOCARDIOGRAM REPORT       IMPRESSIONS    1. Left ventricular ejection fraction, by estimation, is <20%. The left ventricle has severely decreased function. The left ventricle demonstrates global hypokinesis. The left ventricular internal cavity size was severely dilated. Left ventricular  diastolic parameters are indeterminate.  2. Right ventricular systolic function is normal. The right ventricular size is normal.  3. Right atrial size was mildly dilated.  4. The mitral valve is normal in structure. Mild mitral valve regurgitation. No evidence of mitral stenosis.  5. The aortic valve is normal in structure. Aortic valve regurgitation is not visualized. No aortic stenosis is present.  6. The inferior vena cava is normal in size with greater than 50% respiratory variability, suggesting right atrial pressure of 3 mmHg.           CAD  - On Aspirin, statin, betablocker,                  -  followed by cardiology                - last cardiac cath     [facility] (9/24): stable 3v CAD RA 18, PA 41/35 (36), PCWP 27, CO/CI (Fick) 5.8/2.2, PVR  1.6 WU, PAPi 0.33    DM 2 -  Lab Results  Component Value Date   HGBA1C 10.7 (H) 11/29/2021   on insulin,    obesity-   BMI Readings from Last 1 Encounters:  03/09/23 38.75 kg/m       OSA - noncompliant with CPAP      While in ER:       Lab Orders         Comprehensive metabolic panel         CBC with Differential         Brain natriuretic peptide         Magnesium         CBG monitoring, ED       CXR - Mild cardiomegaly with vascular congestion     Following Medications were ordered in ER: Medications  potassium chloride SA (KLOR-CON M) CR tablet 60 mEq (has no administration in time range)  potassium chloride 10 mEq in 100 mL IVPB (has no administration in time range)    _______________________________________________________ ER Provider Called:       DrMarland Kitchen  They Recommend admit to medicine *** Will see in AM  ***SEEN in  ER     ED Triage Vitals  Encounter Vitals Group     BP 03/28/23 2144 110/67     Systolic BP Percentile --      Diastolic BP Percentile --      Pulse Rate 03/28/23 2144 95     Resp 03/28/23 2144 15     Temp 03/28/23 2144 97.8 F (36.6 C)     Temp Source 03/28/23 2144 Oral     SpO2 03/28/23 2141 98 %     Weight --      Height --      Head Circumference --      [name] Flow --      Pain Score 03/28/23 2145 0     Pain Loc --      Pain Education --      Exclude from Growth Chart --   UEAV(40)@     _________________________________________ Significant initial  Findings: Abnormal Labs Reviewed  COMPREHENSIVE METABOLIC PANEL - Abnormal; Notable for the following components:      Result Value   Sodium 134 (*)    Potassium 2.5 (*)    Chloride 94 (*)    Glucose, Bld 206 (*)    All other components within normal limits  BRAIN NATRIURETIC PEPTIDE - Abnormal; Notable for the following components:   B Natriuretic Peptide 178.3 (*)    All other components within normal limits  CBG MONITORING, ED - Abnormal; Notable for the following components:   Glucose-Capillary 204 (*)    All other components within normal limits  TROPONIN I (HIGH SENSITIVITY) - Abnormal; Notable for the following components:   Troponin I (High Sensitivity) 24 (*)    All other components within normal limits    _________________________ Troponin  ordered Cardiac Panel (last 3 results) Recent Labs    03/28/23 2205  TROPONINIHS 24*     ECG: Ordered Personally reviewed and interpreted by me showing: HR : 103 Rhythm: VENTRICULAR PACED RHYTHM Paired ventricular premature complexes Compared to previous tracing Premature ventricular complexes are present QTC 595  BNP (last 3 results) Recent Labs    02/20/23 0930 03/09/23 1243 03/28/23 2205  BNP 321.7* 221.8* 178.3*     COVID-19 Labs  No results for input(s): "DDIMER", "FERRITIN", "LDH", "CRP" in the last 72 hours.  Lab Results  Component Value Date    SARSCOV2NAA NEGATIVE 03/04/2021   SARSCOV2NAA NEGATIVE 06/25/2020   SARSCOV2NAA NOT DETECTED 03/15/2019     The recent clinical data is shown below. Vitals:   03/28/23 2215 03/28/23 2230 03/28/23 2245 03/28/23 2315  BP: (!) 89/60 96/80 (!) 88/67 (!) 87/70  Pulse: 91 88 85 85  Resp: (!) 9 15 17 15   Temp:      TempSrc:      SpO2: 94% 97% 93% 96%     WBC     Component Value Date/Time   WBC 8.7 03/28/2023 2205   LYMPHSABS 1.0 03/28/2023 2205   LYMPHSABS 0.9 01/28/2021 0858   MONOABS 0.8 03/28/2023 2205   EOSABS 0.2 03/28/2023 2205   EOSABS 0.2 01/28/2021 0858   BASOSABS 0.1 03/28/2023 2205   BASOSABS 0.0 01/28/2021 0858    Lactic Acid, Venous No results found for: "LATICACIDVEN"   Procalcitonin *** Ordered      UA   no evidence of UTI      Urine analysis:    Component Value Date/Time   COLORURINE YELLOW 04/10/2022 0225   APPEARANCEUR CLEAR 04/10/2022 0225   LABSPEC 1.025 04/10/2022 0225   PHURINE 6.0 04/10/2022 0225   GLUCOSEU >=500 (A) 04/10/2022 0225   HGBUR NEGATIVE 04/10/2022 0225   BILIRUBINUR NEGATIVE 04/10/2022 0225   KETONESUR NEGATIVE 04/10/2022 0225   PROTEINUR NEGATIVE 04/10/2022 0225   NITRITE NEGATIVE 04/10/2022 0225   LEUKOCYTESUR NEGATIVE 04/10/2022 0225    Results for orders placed or performed during the hospital encounter of 11/27/21  Urine Culture     Status: None   Collection Time: 11/27/21  2:20 PM   Specimen: Urine, Random  Result Value Ref Range Status   Specimen Description   Final    URINE, RANDOM Performed at [facility], [address] [place], [place] [address]    Special Requests   Final    NONE Performed at [facility], [address] [place], [place] [address]    Culture   Final    NO GROWTH Performed at [facility], [address] [place], [place] [address]    Report Status 11/28/2021 FINAL  Final      ________________________________________________________________  Arterial  ***Venous  Blood Gas result:  pH *** pCO2 ***; pO2 ***;     %O2 Sat ***.  ABG    Component Value Date/Time   PHART 7.406 02/23/2023 1707   PCO2ART 50.3 (H) 02/23/2023 1707   PO2ART 64 (L) 02/23/2023 1707   HCO3 31.5 (H) 02/23/2023 1707   TCO2 33 (H) 02/23/2023 1707   O2SAT 92 02/23/2023 1707    __________________________________________________________ Recent Labs  Lab 03/28/23 2205  NA 134*  K 2.5*  CO2 26  GLUCOSE 206*  BUN 13  CREATININE 1.13  CALCIUM 8.9  MG 2.0    Cr  stable,  Lab Results  Component Value Date   CREATININE 1.13 03/28/2023   CREATININE 1.32 (H) 03/09/2023   CREATININE 1.05 02/23/2023    Recent Labs  Lab 03/28/23 2205  AST 27  ALT 25  ALKPHOS 87  BILITOT 1.2  PROT 7.5  ALBUMIN 3.5   Lab Results  Component Value Date   CALCIUM 8.9 03/28/2023    Plt: Lab Results  Component Value Date   PLT 173 03/28/2023       Recent Labs  Lab 03/28/23 2205  WBC 8.7  NEUTROABS 6.7  HGB 15.3  HCT 44.2  MCV 88.8  PLT 173    HG/HCT  stable,      Component Value Date/Time   HGB 15.3 03/28/2023 2205   HGB 14.9 01/28/2021 0858   HCT 44.2 03/28/2023 2205   HCT 45.0 01/28/2021 0858   MCV 88.8 03/28/2023 2205   MCV 93 01/28/2021 0858    _______________________________________________ Hospitalist was called for admission for syncope   The following Work up has been ordered so far:  Orders Placed This Encounter  Procedures  . DG Chest Portable 1 View  . Comprehensive metabolic panel  . CBC with Differential  . Brain natriuretic peptide  . Magnesium  . Interrogate Pacemaker if present  . Consult for [facility] Admission  . CBG monitoring, ED  . EKG 12-Lead     OTHER Significant initial  Findings:  labs showing:     DM  labs:  HbA1C: No results for input(s): "HGBA1C" in the last 8760 hours.     CBG (last 3)  Recent Labs    03/28/23 2141  GLUCAP 204*          Cultures:    Component Value Date/Time   SDES   11/27/2021 1420    URINE, RANDOM Performed at [facility], [address] [place], [place] [address]    [facility]  11/27/2021 1420    NONE Performed at [facility], [address], [place], [place] [address]    CULT  11/27/2021 1420    NO GROWTH Performed at [facility], [address] [place], [place] [address]    REPTSTATUS 11/28/2021 FINAL 11/27/2021 1420     Radiological Exams on Admission: No results found. _______________________________________________________________________________________________________ Latest  Blood pressure (!) 87/70, pulse 85, temperature 97.8 F (36.6 C), temperature source Oral, resp. rate 15, SpO2 96%.   Vitals  labs and radiology finding personally reviewed  Review of Systems:    Pertinent positives include: syncope  Constitutional:  No weight loss, night sweats, Fevers, chills, fatigue, weight loss  HEENT:  No headaches, Difficulty swallowing,Tooth/dental problems,Sore throat,  No sneezing, itching, ear ache, nasal congestion, post nasal drip,  Cardio-vascular:  No chest pain, Orthopnea, PND, anasarca, dizziness, palpitations.no Bilateral lower extremity swelling  GI:  No heartburn, indigestion, abdominal pain, nausea, vomiting, diarrhea, change in bowel habits, loss of appetite, melena, blood in stool, hematemesis Resp:  no shortness of breath at rest. No dyspnea on exertion, No excess mucus, no productive cough, No non-productive cough, No coughing up of blood.No change in color of mucus.No wheezing. Skin:  no rash or lesions. No jaundice GU:  no dysuria, change in color of urine, no urgency or frequency. No straining to urinate.  No flank pain.  Musculoskeletal:  No joint pain or no joint swelling. No decreased range of motion. No back pain.  Psych:  No change in mood or affect. No depression or anxiety. No memory loss.  Neuro: no localizing neurological complaints, no tingling, no weakness, no  double vision, no gait abnormality, no slurred speech, no confusion  All systems reviewed and apart from HOPI all are negative _______________________________________________________________________________________________ Past Medical History:   Past Medical History:  Diagnosis Date  . Abnormal EKG   . Acute systolic heart failure (HCC) 09/06/2017  . Angina pectoris (HCC) 09/05/2017  . Body mass index 45.0-49.9, adult (HCC)   . CAD S/P percutaneous coronary angioplasty 09/07/2017   mLAD and dLAD PCI with DES 09/06/17  . CHF (congestive heart failure) (HCC)   .  Chronic systolic (congestive) heart failure (HCC) 03/19/2019  . Complication of anesthesia 1995   "had hard time waking up; breathing w/vasectomy"  . Coronary artery disease   . Erectile dysfunction   . Essential hypertension, benign   . Fatigue   . GERD (gastroesophageal reflux disease)   . Gout   . High cholesterol    "just started tx yesterday" (09/06/2017)  . History of gout    "RX prn" (09/06/2017)  . Ischemic cardiomyopathy 09/07/2017   EF 25-35%  . Muscular chest pain   . 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 Obesity   . Obstructive sleep apnea   . OSA on CPAP   . Plantar fasciitis   . Pre-operative clearance 02/11/2018  . 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/02/2019   [facility] Jude  . Right bundle branch block   . Shortness of breath 09/05/2017  . Testosterone deficiency   . Type 2 diabetes mellitus with complication, without long-term current use of insulin (HCC) 09/05/2017  . Type II diabetes mellitus (HCC)    "started tx 08/28/2017"      Past Surgical History:  Procedure Laterality Date  . BACK SURGERY    . BIV ICD INSERTION CRT-D N/A 03/19/2019   Procedure: BIV ICD INSERTION CRT-D;  Surgeon: Regan Lemming, MD;  Location: [facility] INVASIVE CV LAB;  Service: Cardiovascular;  Laterality: N/A;  . CORONARY ANGIOPLASTY WITH STENT PLACEMENT   09/06/2017   "3 stents"  . CORONARY STENT INTERVENTION N/A 09/06/2017   Procedure: CORONARY STENT INTERVENTION;  Surgeon: Corky Crafts, MD;  Location: [facility] INVASIVE CV LAB;  Service: Cardiovascular;  Laterality: N/A;  . CORONARY STENT INTERVENTION N/A 02/15/2018   Procedure: CORONARY STENT INTERVENTION;  Surgeon: Corky Crafts, MD;  Location: [facility] INVASIVE CV LAB;  Service: Cardiovascular;  Laterality: N/A;  . KNEE ARTHROSCOPY Right   . LEFT HEART CATH AND CORONARY ANGIOGRAPHY N/A 09/06/2017   Procedure: LEFT HEART CATH AND CORONARY ANGIOGRAPHY;  Surgeon: Corky Crafts, MD;  Location: [facility] INVASIVE CV LAB;  Service: Cardiovascular;  Laterality: N/A;  . LEFT HEART CATH AND CORONARY ANGIOGRAPHY N/A 02/15/2018   Procedure: LEFT HEART CATH AND CORONARY ANGIOGRAPHY;  Surgeon: Corky Crafts, MD;  Location: [facility] INVASIVE CV LAB;  Service: Cardiovascular;  Laterality: N/A;  . LEFT HEART CATH AND CORONARY ANGIOGRAPHY N/A 06/27/2018   Procedure: LEFT HEART CATH AND CORONARY ANGIOGRAPHY;  Surgeon: Corky Crafts, MD;  Location: [facility] INVASIVE CV LAB;  Service: Cardiovascular;  Laterality: N/A;  . LEFT HEART CATH AND CORONARY ANGIOGRAPHY N/A 06/28/2020   Procedure: LEFT HEART CATH AND CORONARY ANGIOGRAPHY;  Surgeon: Swaziland, Peter M, MD;  Location: [facility] INVASIVE CV LAB;  Service: Cardiovascular;  Laterality: N/A;  . LUMBAR SPINE SURGERY  ~ 2001   Intradiscal Electrothermoplasty  . RIGHT/LEFT HEART CATH AND CORONARY ANGIOGRAPHY N/A 03/07/2021   Procedure: RIGHT/LEFT HEART CATH AND CORONARY ANGIOGRAPHY;  Surgeon: Laurey Morale, MD;  Location: [facility] INVASIVE CV LAB;  Service: Cardiovascular;  Laterality: N/A;  . RIGHT/LEFT HEART CATH AND CORONARY ANGIOGRAPHY N/A 02/23/2023   Procedure: RIGHT/LEFT HEART CATH AND CORONARY ANGIOGRAPHY;  Surgeon: Dolores Patty, MD;  Location: [facility] INVASIVE CV LAB;  Service: Cardiovascular;  Laterality: N/A;  . VASECTOMY  1995    Social History:  Ambulatory    independently       reports that he has never smoked. He has never used smokeless tobacco. He reports that he does not  drink alcohol and does not use drugs.   Family History:   Family History  Problem Relation Age of Onset  . Lymphoma Father   . Diabetes Mellitus II Brother   . Diabetes Mellitus II Paternal Grandfather   . Lung cancer Mother   . Brain cancer Mother    ______________________________________________________________________________________________ Allergies: No Known Allergies   Prior to Admission medications   Medication Sig Start Date End Date Taking? Authorizing Provider  acetaminophen (TYLENOL) 500 MG tablet Take 1,000 mg by mouth every 6 (six) hours as needed for moderate pain or headache.   Yes [provider]  allopurinol (ZYLOPRIM) 100 MG tablet Take 100 mg by mouth daily.   Yes [provider]  Aromatic Inhalants (VICKS VAPOINHALER) INHA Inhale 1 puff into the lungs daily as needed (congestion).   Yes [provider]  aspirin EC 81 MG tablet Take 1 tablet (81 mg total) by mouth daily. 09/05/17  Yes Georgeanna Lea, MD  colchicine 0.6 MG tablet Take 0.6 mg by mouth daily as needed (Gout). 05/10/20  Yes [provider]  diclofenac Sodium (VOLTAREN) 1 % GEL APPLY 1 G TOPICALLY DAILY. Patient taking differently: Apply 2 g topically daily as needed (for pain). 01/11/23  Yes Standiford, Jenelle Mages, DPM  empagliflozin (JARDIANCE) 10 MG TABS tablet Take 1 tablet (10 mg total) by mouth daily before breakfast. 02/23/23  Yes Andrey Farmer, PA-C  escitalopram (LEXAPRO) 10 MG tablet Take 10 mg by mouth at bedtime. 06/24/21  Yes [provider]  gabapentin (NEURONTIN) 100 MG capsule Take 100-200 mg by mouth See admin instructions. Take 2 capsules by mouth in the morning and 1 capsule at bedtime or vice versa 11/09/21  Yes [provider]  isosorbide mononitrate (IMDUR) 30 MG 24 hr tablet TAKE 1 TABLET BY MOUTH EVERY  DAY 10/02/22  Yes Georgeanna Lea, MD  metolazone (ZAROXOLYN) 2.5 MG tablet Take 1 tablet (2.5 mg total) by mouth daily. Take one tablet (2.5 mg) once weekly.  Take extra potassium (20 meq) when  you take metolazone Patient taking differently: Take 2.5 mg by mouth See admin instructions. Take one tablet (2.5 mg) once weekly on Saturday.  May take 1 tablet during the week for weight gain. Take extra potassium (20 meq) when  you take metolazone 02/23/23  Yes Bensimhon, Bevelyn Buckles, MD  metoprolol succinate (TOPROL-XL) 25 MG 24 hr tablet Take 0.5 tablets (12.5 mg total) by mouth daily. Take with or immediately following a meal. 03/12/23  Yes Milford, Anderson Malta, FNP  mexiletine (MEXITIL) 250 MG capsule Take 1 capsule (250 mg total) by mouth 2 (two) times daily. 12/18/22  Yes Camnitz, Andree Coss, MD  nitroGLYCERIN (NITROSTAT) 0.4 MG SL tablet Place 1 tablet (0.4 mg total) under the tongue every 5 (five) minutes as needed for chest pain. 03/16/23  Yes Milford, Anderson Malta, FNP  omeprazole (PRILOSEC) 40 MG capsule Take 1 capsule (40 mg total) by mouth daily. 01/14/19  Yes Georgeanna Lea, MD  OZEMPIC, 1 MG/DOSE, 4 MG/3ML SOPN Inject 1 mg into the skin every Monday. 12/28/22  Yes [provider]  Polyethylene Glycol 400 (BLINK TEARS OP) Place 2 drops into both eyes 4 (four) times daily as needed (for dry eyes).   Yes [provider]  potassium chloride SA (KLOR-CON M) 20 MEQ tablet Take 2 tablets (40 mEq total) by mouth every morning AND 1 tablet (20 mEq total) every evening. On days you take metolazone take 40 meq of potassium  in the morning and 40 in the evening.. Patient taking differently: Take 2 tablets (40 mEq total) by mouth every morning AND 2 tablet (40 mEq total) every evening. On days you take metolazone take 40 meq of potassium in the morning and 40 in the evening.. 02/23/23  Yes Bensimhon, Bevelyn Buckles, MD  ranolazine (RANEXA) 500 MG 12 hr tablet Take 1 tablet (500 mg total) by mouth 2 (two)  times daily. 07/18/22  Yes Georgeanna Lea, MD  rosuvastatin (CRESTOR) 40 MG tablet Take 1 tablet (40 mg total) by mouth daily. 07/18/22  Yes Georgeanna Lea, MD  spironolactone (ALDACTONE) 25 MG tablet Take 1 tablet (25 mg total) by mouth daily. 11/20/22 11/15/23 Yes Milford, Anderson Malta, FNP  torsemide (DEMADEX) 20 MG tablet Take 2 tablets (40 mg total) by mouth every morning AND 2 tablets (40 mg total) every evening. 02/23/23 05/24/23 Yes Bensimhon, Bevelyn Buckles, MD  triamcinolone cream (KENALOG) 0.1 % Apply 1 Application topically 2 (two) times daily. 10/09/22  Yes [provider]  ibuprofen (ADVIL) 200 MG tablet Take 800 mg by mouth every 8 (eight) hours as needed for headache or moderate pain.    [provider]    ___________________________________________________________________________________________________ Physical Exam:    03/28/2023   11:15 PM 03/28/2023   10:45 PM 03/28/2023   10:30 PM  Vitals with BMI  Systolic 87 88 96  Diastolic 70 67 80  Pulse 85 85 88     1. General:  in No  Acute distress    Chronically ill  -appearing 2. Psychological: Alert and   Oriented 3. Head/ENT:   Moist  Mucous Membranes                          Head Non traumatic, neck supple                          Poor Dentition 4. SKIN:  decreased Skin turgor,  Skin clean Dry and intact no rash    5. Heart: Regular rate and rhythm no*** Murmur, no Rub or gallop 6. Lungs: , no wheezes or crackles   7. Abdomen: Soft, ***non-tender, Non distended *** obese ***bowel sounds present 8. Lower extremities: no clubbing, cyanosis, no ***edema 9. Neurologically Grossly intact, moving all 4 extremities equally  10. MSK: Normal range of motion    Chart has been reviewed  ______________________________________________________________________________________________  Assessment/Plan 52 y.o. male with medical history significant of Systolic CHF EF 25-35%,CAD, HTN, GERD, gout, HLD, OSA, obesity,  RBBB, DM2 Admitted for syncope   Present on Admission: . Syncope     No problem-specific Assessment & Plan notes found for this encounter.    Other plan as per orders.  DVT prophylaxis:  SCD      Code Status:    Code Status: Prior FULL CODE *** DNR/DNI ***comfort care as per patient ***family  I had personally discussed CODE STATUS with patient and family*  ACP *** none has been reviewed ***   Family Communication:   Family not at  Bedside  plan of care was discussed on the phone with *** Son, Daughter, Wife, Husband, Sister, Brother , father, mother  Diet    Disposition Plan:   *** likely will need placement for rehabilitation                          Back to current facility when  stable                            To home once workup is complete and patient is stable  ***Following barriers for discharge:                             Chest pain *** Stroke *** work up is complete                            Electrolytes corrected                               Anemia corrected h/H stable                             Pain controlled with PO medications                               Afebrile, white count improving able to transition to PO antibiotics                             Will need to be able to tolerate PO                            Will likely need home health, home O2, set up                           Will need consultants to evaluate patient prior to discharge                        ***Would benefit from PT/OT eval prior to DC  Ordered                   Swallow eval - SLP ordered                   Diabetes care coordinator                   Transition of care consulted                   Nutrition    consulted                  Wound care  consulted                   Palliative care    consulted                   Behavioral health  consulted                    Consults called: ***  emailed Cardiology    Admission status:  ED Disposition     ED Disposition   Admit   Condition  --   Comment  Hospital Area: [facility] [[id number]]  Level of Care: Progressive [102]  Admit to Progressive based on following criteria: CARDIOVASCULAR & THORACIC of moderate stability with acute coronary syndrome symptoms/low risk myocardial infarction/hypertensive urgency/arrhythmias/heart failure  potentially compromising stability and stable post cardiovascular intervention patients.  May place patient in observation at [facility] or [facility] if equivalent level of care is available:: No  Covid Evaluation: Asymptomatic - no recent exposure (last 10 days) testing not required  Diagnosis: Syncope [206001]  Admitting Physician: Therisa Doyne [[id number]]  Attending Physician: Therisa Doyne [[id number]]           Obs     Level of care         progressive     stepdown   tele indefinitely please discontinue once patient no longer qualifies COVID-19 Labs     Parsa Rickett 03/28/2023, 11:27 PM ***  [facility]     after 2 AM please page floor coverage PA If 7AM-7PM, please contact the day team taking care of the patient using [email or web address]

## 2023-03-29 ENCOUNTER — Encounter (HOSPITAL_COMMUNITY): Payer: Self-pay | Admitting: Internal Medicine

## 2023-03-29 ENCOUNTER — Telehealth: Payer: Self-pay

## 2023-03-29 DIAGNOSIS — G4733 Obstructive sleep apnea (adult) (pediatric): Secondary | ICD-10-CM | POA: Diagnosis present

## 2023-03-29 DIAGNOSIS — I5022 Chronic systolic (congestive) heart failure: Secondary | ICD-10-CM | POA: Diagnosis present

## 2023-03-29 DIAGNOSIS — R55 Syncope and collapse: Secondary | ICD-10-CM | POA: Diagnosis present

## 2023-03-29 DIAGNOSIS — I4901 Ventricular fibrillation: Secondary | ICD-10-CM | POA: Diagnosis present

## 2023-03-29 DIAGNOSIS — I160 Hypertensive urgency: Secondary | ICD-10-CM | POA: Diagnosis present

## 2023-03-29 DIAGNOSIS — I252 Old myocardial infarction: Secondary | ICD-10-CM | POA: Diagnosis not present

## 2023-03-29 DIAGNOSIS — I472 Ventricular tachycardia, unspecified: Secondary | ICD-10-CM | POA: Diagnosis present

## 2023-03-29 DIAGNOSIS — N529 Male erectile dysfunction, unspecified: Secondary | ICD-10-CM | POA: Diagnosis present

## 2023-03-29 DIAGNOSIS — Z79899 Other long term (current) drug therapy: Secondary | ICD-10-CM | POA: Diagnosis not present

## 2023-03-29 DIAGNOSIS — R9431 Abnormal electrocardiogram [ECG] [EKG]: Secondary | ICD-10-CM | POA: Diagnosis present

## 2023-03-29 DIAGNOSIS — I110 Hypertensive heart disease with heart failure: Secondary | ICD-10-CM | POA: Diagnosis present

## 2023-03-29 DIAGNOSIS — E876 Hypokalemia: Secondary | ICD-10-CM | POA: Diagnosis present

## 2023-03-29 DIAGNOSIS — M1A9XX0 Chronic gout, unspecified, without tophus (tophi): Secondary | ICD-10-CM | POA: Diagnosis present

## 2023-03-29 DIAGNOSIS — I2510 Atherosclerotic heart disease of native coronary artery without angina pectoris: Secondary | ICD-10-CM | POA: Diagnosis present

## 2023-03-29 DIAGNOSIS — I493 Ventricular premature depolarization: Secondary | ICD-10-CM | POA: Diagnosis not present

## 2023-03-29 DIAGNOSIS — I4510 Unspecified right bundle-branch block: Secondary | ICD-10-CM | POA: Diagnosis present

## 2023-03-29 DIAGNOSIS — Z85828 Personal history of other malignant neoplasm of skin: Secondary | ICD-10-CM | POA: Diagnosis not present

## 2023-03-29 DIAGNOSIS — Z7982 Long term (current) use of aspirin: Secondary | ICD-10-CM | POA: Diagnosis not present

## 2023-03-29 DIAGNOSIS — E119 Type 2 diabetes mellitus without complications: Secondary | ICD-10-CM | POA: Diagnosis present

## 2023-03-29 DIAGNOSIS — Z6838 Body mass index (BMI) 38.0-38.9, adult: Secondary | ICD-10-CM | POA: Diagnosis not present

## 2023-03-29 DIAGNOSIS — K219 Gastro-esophageal reflux disease without esophagitis: Secondary | ICD-10-CM | POA: Diagnosis present

## 2023-03-29 DIAGNOSIS — I5082 Biventricular heart failure: Secondary | ICD-10-CM | POA: Diagnosis present

## 2023-03-29 DIAGNOSIS — E7800 Pure hypercholesterolemia, unspecified: Secondary | ICD-10-CM | POA: Diagnosis present

## 2023-03-29 DIAGNOSIS — I5084 End stage heart failure: Secondary | ICD-10-CM | POA: Diagnosis present

## 2023-03-29 LAB — BASIC METABOLIC PANEL
Anion gap: 15 (ref 5–15)
BUN: 15 mg/dL (ref 6–20)
CO2: 31 mmol/L (ref 22–32)
Calcium: 9.1 mg/dL (ref 8.9–10.3)
Chloride: 91 mmol/L — ABNORMAL LOW (ref 98–111)
Creatinine, Ser: 1.26 mg/dL — ABNORMAL HIGH (ref 0.61–1.24)
GFR, Estimated: >60 mL/min (ref >60)
Glucose, Bld: 146 mg/dL — ABNORMAL HIGH (ref 70–99)
Potassium: 3.5 mmol/L (ref 3.5–5.1)
Sodium: 137 mmol/L (ref 135–145)

## 2023-03-29 LAB — TROPONIN I (HIGH SENSITIVITY)
Troponin I (High Sensitivity): 30 ng/L — ABNORMAL HIGH (ref <18)
Troponin I (High Sensitivity): 30 ng/L — ABNORMAL HIGH (ref <18)

## 2023-03-29 LAB — PREALBUMIN: Prealbumin: 24 mg/dL (ref 18–38)

## 2023-03-29 LAB — CBC
HCT: 42.5 % (ref 39.0–52.0)
Hemoglobin: 14.5 g/dL (ref 13.0–17.0)
MCH: 30.4 pg (ref 26.0–34.0)
MCHC: 34.1 g/dL (ref 30.0–36.0)
MCV: 89.1 fL (ref 80.0–100.0)
Platelets: 186 10*3/uL (ref 150–400)
RBC: 4.77 MIL/uL (ref 4.22–5.81)
RDW: 13.3 % (ref 11.5–15.5)
WBC: 8.3 10*3/uL (ref 4.0–10.5)
nRBC: 0 % (ref 0.0–0.2)

## 2023-03-29 LAB — BLOOD GAS, VENOUS
Acid-Base Excess: 10 mmol/L — ABNORMAL HIGH (ref 0.0–2.0)
Bicarbonate: 35 mmol/L — ABNORMAL HIGH (ref 20.0–28.0)
O2 Saturation: 91.3 %
Patient temperature: 37
pCO2, Ven: 47 mm[Hg] (ref 44–60)
pH, Ven: 7.48 — ABNORMAL HIGH (ref 7.25–7.43)
pO2, Ven: 60 mm[Hg] — ABNORMAL HIGH (ref 32–45)

## 2023-03-29 LAB — COMPREHENSIVE METABOLIC PANEL
ALT: 22 U/L (ref 0–44)
AST: 24 U/L (ref 15–41)
Albumin: 3.5 g/dL (ref 3.5–5.0)
Alkaline Phosphatase: 85 U/L (ref 38–126)
Anion gap: 11 (ref 5–15)
BUN: 12 mg/dL (ref 6–20)
CO2: 29 mmol/L (ref 22–32)
Calcium: 8.6 mg/dL — ABNORMAL LOW (ref 8.9–10.3)
Chloride: 94 mmol/L — ABNORMAL LOW (ref 98–111)
Creatinine, Ser: 1.18 mg/dL (ref 0.61–1.24)
GFR, Estimated: >60 mL/min (ref >60)
Glucose, Bld: 139 mg/dL — ABNORMAL HIGH (ref 70–99)
Potassium: 3.2 mmol/L — ABNORMAL LOW (ref 3.5–5.1)
Sodium: 134 mmol/L — ABNORMAL LOW (ref 135–145)
Total Bilirubin: 1.1 mg/dL (ref 0.3–1.2)
Total Protein: 7.2 g/dL (ref 6.5–8.1)

## 2023-03-29 LAB — PHOSPHORUS
Phosphorus: 5.1 mg/dL — ABNORMAL HIGH (ref 2.5–4.6)
Phosphorus: 6 mg/dL — ABNORMAL HIGH (ref 2.5–4.6)

## 2023-03-29 LAB — CBG MONITORING, ED
Glucose-Capillary: 143 mg/dL — ABNORMAL HIGH (ref 70–99)
Glucose-Capillary: 136 mg/dL — ABNORMAL HIGH (ref 70–99)
Glucose-Capillary: 164 mg/dL — ABNORMAL HIGH (ref 70–99)
Glucose-Capillary: 206 mg/dL — ABNORMAL HIGH (ref 70–99)

## 2023-03-29 LAB — GLUCOSE, CAPILLARY
Glucose-Capillary: 171 mg/dL — ABNORMAL HIGH (ref 70–99)
Glucose-Capillary: 141 mg/dL — ABNORMAL HIGH (ref 70–99)
Glucose-Capillary: 179 mg/dL — ABNORMAL HIGH (ref 70–99)

## 2023-03-29 LAB — PROCALCITONIN: Procalcitonin: <0.1 ng/mL

## 2023-03-29 LAB — LACTIC ACID, PLASMA
Lactic Acid, Venous: 1.3 mmol/L (ref 0.5–1.9)
Lactic Acid, Venous: 1.3 mmol/L (ref 0.5–1.9)

## 2023-03-29 LAB — MAGNESIUM
Magnesium: 2.1 mg/dL (ref 1.7–2.4)
Magnesium: 2.2 mg/dL (ref 1.7–2.4)

## 2023-03-29 LAB — CK: Total CK: 65 U/L (ref 49–397)

## 2023-03-29 LAB — D-DIMER, QUANTITATIVE: D-Dimer, Quant: 0.29 ug{FEU}/mL (ref 0.00–0.50)

## 2023-03-29 LAB — TSH: TSH: 3.878 u[IU]/mL (ref 0.350–4.500)

## 2023-03-29 LAB — HIV ANTIBODY (ROUTINE TESTING W REFLEX): HIV Screen 4th Generation wRfx: NONREACTIVE

## 2023-03-29 LAB — HEMOGLOBIN A1C
Hgb A1c MFr Bld: 7.5 % — ABNORMAL HIGH (ref 4.8–5.6)
Mean Plasma Glucose: 168.55 mg/dL

## 2023-03-29 MED ORDER — AMIODARONE HCL IN DEXTROSE 360-4.14 MG/200ML-% IV SOLN
30.0000 mg/h | INTRAVENOUS | Status: DC
  Administered 2023-03-29 – 2023-03-30 (×4): 30 mg/h via INTRAVENOUS
  Filled 2023-03-29 (×3): qty 200

## 2023-03-29 MED ORDER — ALLOPURINOL 100 MG PO TABS
100.0000 mg | ORAL_TABLET | Freq: Every day | ORAL | Status: DC
  Administered 2023-03-29 – 2023-04-01 (×4): 100 mg via ORAL
  Filled 2023-03-29 (×4): qty 1

## 2023-03-29 MED ORDER — SODIUM CHLORIDE 0.9% FLUSH
3.0000 mL | INTRAVENOUS | Status: DC | PRN
  Administered 2023-03-31: 3 mL via INTRAVENOUS

## 2023-03-29 MED ORDER — MEXILETINE HCL 250 MG PO CAPS
250.0000 mg | ORAL_CAPSULE | Freq: Two times a day (BID) | ORAL | Status: DC
  Administered 2023-03-29 – 2023-04-01 (×7): 250 mg via ORAL
  Filled 2023-03-29 (×8): qty 1

## 2023-03-29 MED ORDER — SODIUM CHLORIDE 0.9% FLUSH
3.0000 mL | Freq: Two times a day (BID) | INTRAVENOUS | Status: DC
  Administered 2023-03-29 – 2023-04-01 (×4): 3 mL via INTRAVENOUS

## 2023-03-29 MED ORDER — GABAPENTIN 100 MG PO CAPS
100.0000 mg | ORAL_CAPSULE | Freq: Every day | ORAL | Status: DC
  Administered 2023-03-29 – 2023-03-31 (×3): 100 mg via ORAL
  Filled 2023-03-29 (×3): qty 1

## 2023-03-29 MED ORDER — TORSEMIDE 20 MG PO TABS
40.0000 mg | ORAL_TABLET | Freq: Two times a day (BID) | ORAL | Status: DC
  Administered 2023-03-29 – 2023-04-01 (×7): 40 mg via ORAL
  Filled 2023-03-29 (×7): qty 2

## 2023-03-29 MED ORDER — RANOLAZINE ER 500 MG PO TB12
500.0000 mg | ORAL_TABLET | Freq: Two times a day (BID) | ORAL | Status: DC
  Administered 2023-03-29 – 2023-04-01 (×8): 500 mg via ORAL
  Filled 2023-03-29 (×8): qty 1

## 2023-03-29 MED ORDER — POTASSIUM CHLORIDE 20 MEQ PO PACK
40.0000 meq | PACK | Freq: Once | ORAL | Status: AC
  Administered 2023-03-29: 40 meq via ORAL
  Filled 2023-03-29: qty 2

## 2023-03-29 MED ORDER — GABAPENTIN 100 MG PO CAPS
200.0000 mg | ORAL_CAPSULE | Freq: Every morning | ORAL | Status: DC
  Administered 2023-03-29 – 2023-04-01 (×4): 200 mg via ORAL
  Filled 2023-03-29 (×4): qty 2

## 2023-03-29 MED ORDER — ASPIRIN 81 MG PO TBEC
81.0000 mg | DELAYED_RELEASE_TABLET | Freq: Every day | ORAL | Status: DC
  Administered 2023-03-29 – 2023-04-01 (×4): 81 mg via ORAL
  Filled 2023-03-29 (×4): qty 1

## 2023-03-29 MED ORDER — SODIUM CHLORIDE 0.9 % IV SOLN: 250.0000 mL | INTRAVENOUS | Status: AC | PRN

## 2023-03-29 MED ORDER — ACETAMINOPHEN 325 MG PO TABS
650.0000 mg | ORAL_TABLET | Freq: Four times a day (QID) | ORAL | Status: DC | PRN
  Administered 2023-03-29: 650 mg via ORAL
  Filled 2023-03-29: qty 2

## 2023-03-29 MED ORDER — POTASSIUM CHLORIDE CRYS ER 20 MEQ PO TBCR
40.0000 meq | EXTENDED_RELEASE_TABLET | Freq: Two times a day (BID) | ORAL | Status: DC
  Administered 2023-03-29 – 2023-03-30 (×3): 40 meq via ORAL
  Filled 2023-03-29 (×3): qty 2

## 2023-03-29 MED ORDER — GABAPENTIN 100 MG PO CAPS: 200.0000 mg | ORAL_CAPSULE | Freq: Every morning | ORAL | Status: DC

## 2023-03-29 MED ORDER — ENOXAPARIN SODIUM 40 MG/0.4ML IJ SOSY
40.0000 mg | PREFILLED_SYRINGE | INTRAMUSCULAR | Status: DC
  Administered 2023-03-29 – 2023-03-30 (×2): 40 mg via SUBCUTANEOUS
  Filled 2023-03-29 (×2): qty 0.4

## 2023-03-29 MED ORDER — ACETAMINOPHEN 650 MG RE SUPP: 650.0000 mg | Freq: Four times a day (QID) | RECTAL | Status: DC | PRN

## 2023-03-29 MED ORDER — PANTOPRAZOLE SODIUM 40 MG PO TBEC
40.0000 mg | DELAYED_RELEASE_TABLET | Freq: Every day | ORAL | Status: DC
  Administered 2023-03-29 – 2023-03-30 (×2): 40 mg via ORAL
  Filled 2023-03-29 (×2): qty 1

## 2023-03-29 MED ORDER — POTASSIUM CHLORIDE CRYS ER 20 MEQ PO TBCR: 40.0000 meq | EXTENDED_RELEASE_TABLET | Freq: Once | ORAL | Status: DC

## 2023-03-29 MED ORDER — ROSUVASTATIN CALCIUM 20 MG PO TABS
40.0000 mg | ORAL_TABLET | Freq: Every day | ORAL | Status: DC
  Administered 2023-03-29 – 2023-04-01 (×4): 40 mg via ORAL
  Filled 2023-03-29 (×4): qty 2

## 2023-03-29 MED ORDER — HYDROCODONE-ACETAMINOPHEN 5-325 MG PO TABS: 1.0000 | ORAL_TABLET | ORAL | Status: DC | PRN

## 2023-03-29 MED ORDER — AMIODARONE HCL IN DEXTROSE 360-4.14 MG/200ML-% IV SOLN
60.0000 mg/h | INTRAVENOUS | Status: DC
  Administered 2023-03-29 (×2): 60 mg/h via INTRAVENOUS
  Filled 2023-03-29 (×2): qty 200

## 2023-03-29 MED ORDER — GABAPENTIN 100 MG PO CAPS
100.0000 mg | ORAL_CAPSULE | Freq: Every day | ORAL | Status: DC
  Administered 2023-03-29: 100 mg via ORAL
  Filled 2023-03-29: qty 1

## 2023-03-29 MED ORDER — AMIODARONE LOAD VIA INFUSION
150.0000 mg | Freq: Once | INTRAVENOUS | Status: AC
  Administered 2023-03-29: 150 mg via INTRAVENOUS
  Filled 2023-03-29: qty 83.34

## 2023-03-29 MED ORDER — GABAPENTIN 100 MG PO CAPS: 100.0000 mg | ORAL_CAPSULE | ORAL | Status: DC

## 2023-03-29 NOTE — Assessment & Plan Note (Signed)
Unclear etiology pacemaker interrogated Noted to have intermittent low bp

## 2023-03-29 NOTE — Assessment & Plan Note (Signed)
Will need to have outpatient follow-up with nutrition

## 2023-03-29 NOTE — Assessment & Plan Note (Signed)
Hold Ozempic and jardiance Order SSI

## 2023-03-29 NOTE — Assessment & Plan Note (Signed)
No CP no SOB Continue  crestor 40mg  po Q day, aspirin 81 mg po Q day

## 2023-03-29 NOTE — Telephone Encounter (Signed)
Alert received from [employer]:  Alert remote transmission: VT/VF event detected. There was a fast ventricular arrhythmia detected that was successfully converted after an ICD shock, sent to triage [facility]  Pt currently hospitalized.

## 2023-03-29 NOTE — Assessment & Plan Note (Signed)
Not compliant with CPAP 

## 2023-03-29 NOTE — Assessment & Plan Note (Signed)
-   will replace electrolytes and repeat  check Mg, phos and Ca level and replace as needed Monitor on telemetry   Lab Results  Component Value Date   K 2.5 (LL) 03/28/2023     Lab Results  Component Value Date   CREATININE 1.13 03/28/2023   Lab Results  Component Value Date   MG 2.0 03/28/2023   Lab Results  Component Value Date   CALCIUM 8.9 03/28/2023

## 2023-03-29 NOTE — Progress Notes (Signed)
PROGRESS NOTE    Joe [name]  XBM:[id number] DOB: [birth year] DOA: 03/28/2023 PCP: Lonie Peak, PA-C   Brief Narrative:  This 52 yrs old Male with h/o morbid obesity, DM2, HTN, Hyperlipidemia, OSA, CAD and systolic HF (LVEF 20-25%) referred by Dr. Bing Matter for further management of his HF.  Patient does not remember what happened.  Patient reports he was feeling hot, nauseous and dizzy,  his arms felt numb prior to syncope.  Patient is following up with heart failure team at [facility],  He denies that his AICD did not fire.  He took an extra dose of metolazone without any improvement.  Patient is admitted for further evaluation.  Assessment & Plan:   Principal Problem:   Syncope Active Problems:   Obstructive sleep apnea   Essential hypertension, benign   Obesity   Gout   Type 2 diabetes mellitus with complication, without long-term current use of insulin (HCC)   CAD S/P percutaneous coronary angioplasty   Chronic systolic CHF (congestive heart failure) (HCC)   Hyperlipidemia   OSA on CPAP   Hypokalemia   Prolonged QT interval  Syncope due to V-fib with ICD shock: Patient presented with syncopal event likely secondary to V-fib with ICD shock. Pacemaker interrogation completed. Cardiology evaluation completed.  Patient is loaded with amiodarone.   Keep potassium above 4 magnesium above 2. Cardiology is following.  Continue amiodarone IV infusion.  Chronic systolic CHF (congestive heart failure) (HCC) Volume status looks okay.  BNP 178. Continue GDMT and home diuretics as tolerated. Goal is to DC over the weekend so he can keep up with his [facility] transplant evaluation next week.   Essential hypertension: Hold blood pressure medications as blood pressure is running on soft side.   Obesity: Diet and exercise discussed in detail.   Gout: Chronic stable. Continue home medications. Including allopurinol   Type 2 diabetes mellitus with complication: Hold Ozempic and  jardiance. Order SSI   CAD S/P percutaneous coronary angioplasty Denies chest pain or shortness of breath. Continue crestor 40mg  po Q day, aspirin 81 mg po Q day. Continue Ranexa.   Hyperlipidemia: Continue Crestor 40 mg a day.   OSA on CPAP Not compliant with CPAP.   Hypokalemia Replaced.  Continue to monitor.   DVT prophylaxis: SCDs Code Status: Full code Family Communication: Family at bed side. Disposition Plan:     Status is: Observation The patient remains OBS appropriate and will d/c before 2 midnights.   Admitted for syncope    Consultants:  Cardiology  Procedures: AICD interrogation  Antimicrobials: Anti-infectives (From admission, onward)    None      Subjective: Patient was seen and examined at bedside.  Overnight events noted. Patient reports feeling better denies any dizzy spells. Patient is started on amiodarone infusion for arrhythmias.  Objective: Vitals:   03/29/23 0723 03/29/23 0730 03/29/23 0945 03/29/23 1035  BP: 98/72 100/83  108/83  Pulse: 92 92 96 93  Resp: (!) 23 (!) 25 19 18   Temp: (!) 97.5 F (36.4 C)     TempSrc: Oral     SpO2: 92% 94% 94% 93%    Intake/Output Summary (Last 24 hours) at 03/29/2023 1112 Last data filed at 03/29/2023 0706 Gross per 24 hour  Intake 397.95 ml  Output --  Net 397.95 ml   There were no vitals filed for this visit.  Examination:  General exam: Appears calm and comfortable, not in any acute distress. Respiratory system: CTA bilaterally.  Respiratory effort normal.  RR 16.  Cardiovascular system: S1 & S2 heard, regular rate and rhythm, murmur noted.. No pedal edema. Gastrointestinal system: Abdomen is non distended, soft and non tender.  Normal bowel sounds heard. Central nervous system: Alert and oriented x 3. No focal neurological deficits. Extremities: No edema, no cyanosis, no clubbing Skin: No rashes, lesions or ulcers Psychiatry: Judgement and insight appear normal. Mood & affect  appropriate.     Data Reviewed: I have personally reviewed following labs and imaging studies  CBC: Recent Labs  Lab 03/28/23 2205 03/29/23 0248  WBC 8.7 8.3  NEUTROABS 6.7  --   HGB 15.3 14.5  HCT 44.2 42.5  MCV 88.8 89.1  PLT 173 186   Basic Metabolic Panel: Recent Labs  Lab 03/28/23 2205 03/29/23 0024 03/29/23 0248  NA 134*  --  134*  K 2.5*  --  3.2*  CL 94*  --  94*  CO2 26  --  29  GLUCOSE 206*  --  139*  BUN 13  --  12  CREATININE 1.13  --  1.18  CALCIUM 8.9  --  8.6*  MG 2.0 2.1 2.2  PHOS  --  5.1* 6.0*   GFR: CrCl cannot be calculated (Unknown ideal weight.). Liver Function Tests: Recent Labs  Lab 03/28/23 2205 03/29/23 0248  AST 27 24  ALT 25 22  ALKPHOS 87 85  BILITOT 1.2 1.1  PROT 7.5 7.2  ALBUMIN 3.5 3.5   No results for input(s): "LIPASE", "AMYLASE" in the last 168 hours. No results for input(s): "AMMONIA" in the last 168 hours. Coagulation Profile: No results for input(s): "INR", "PROTIME" in the last 168 hours. Cardiac Enzymes: Recent Labs  Lab 03/29/23 0024  CKTOTAL 65   BNP (last 3 results) Recent Labs    05/10/22 0905 08/10/22 1534  PROBNP 475* 351*   HbA1C: Recent Labs    03/28/23 2204  HGBA1C 7.5*   CBG: Recent Labs  Lab 03/28/23 2141 03/29/23 0214 03/29/23 0451 03/29/23 0727  GLUCAP 204* 143* 136* 164*   Lipid Profile: No results for input(s): "CHOL", "HDL", "LDLCALC", "TRIG", "CHOLHDL", "LDLDIRECT" in the last 72 hours. Thyroid Function Tests: Recent Labs    03/28/23 2204  TSH 3.878   Anemia Panel: No results for input(s): "VITAMINB12", "FOLATE", "FERRITIN", "TIBC", "IRON", "RETICCTPCT" in the last 72 hours. Sepsis Labs: Recent Labs  Lab 03/29/23 0024 03/29/23 0248  PROCALCITON <0.10  --   LATICACIDVEN 1.3 1.3    No results found for this or any previous visit (from the past 240 hour(s)).   Radiology Studies: DG Chest Portable 1 View  Result Date: 03/28/2023 CLINICAL DATA:  Chest pain.   Loss of consciousness. EXAM: PORTABLE CHEST 1 VIEW COMPARISON:  Chest CT 02/26/2023. FINDINGS: Mild cardiomegaly. Left-sided pacemaker in place. Vascular congestion without overt edema. No pneumothorax or large pleural effusion. Please note the right lateral costophrenic angle is excluded from the field of view. IMPRESSION: Mild cardiomegaly with vascular congestion. Electronically Signed   By: Narda Rutherford M.D.   On: 03/28/2023 23:27    Scheduled Meds:  allopurinol  100 mg Oral Daily   aspirin EC  81 mg Oral Daily   enoxaparin (LOVENOX) injection  40 mg Subcutaneous Q24H   gabapentin  200 mg Oral q morning   And   gabapentin  100 mg Oral QHS   insulin aspart  0-9 Units Subcutaneous Q4H   mexiletine  250 mg Oral BID   pantoprazole  40 mg Oral Daily   potassium chloride  40  mEq Oral BID   ranolazine  500 mg Oral BID   rosuvastatin  40 mg Oral Daily   sodium chloride flush  3 mL Intravenous Q12H   torsemide  40 mg Oral BID   Continuous Infusions:  sodium chloride     amiodarone 60 mg/hr (03/29/23 0915)   Followed by   amiodarone       LOS: 0 days    Time spent: 50 mins    Willeen Niece, MD [facility]   If 7PM-7AM, please contact night-coverage

## 2023-03-29 NOTE — Assessment & Plan Note (Addendum)
Chronic stable continue home medications Including allopurinol

## 2023-03-29 NOTE — Consult Note (Addendum)
ELECTROPHYSIOLOGY CONSULT NOTE    Patient ID: Joe [name] MRN: [id number], DOB/AGE: [birth year] 52 y.o.  Admit date: 03/28/2023 Date of Consult: 03/29/2023  Primary Physician: Lonie Peak, PA-C Primary Cardiologist: [name], MD  Electrophysiologist: Dr. Elberta Fortis   Referring Provider: Dr. Idelle Leech  Patient Profile: Joe [name] is a 52 y.o. male with a history of chronic systolic CHF, CAD, HTN, GERD, gout, HLD, OSA, and obesity who is being seen today for the evaluation of syncope and ICD shock at the request of Dr. Idelle Leech.  HPI:   Pt last seen by EP 12/18/2022. Was doing overall well on Mexitil for his PVCs  Seen by CHF clinic 03/09/2023 after he underwent R/LHC (9/24) showing stable 3v CAD, severe biventricular failure with elevated filling pressures and very low PAPi suggestive of poor RV function. Not VAD candidate with poor RV function. Torsemide increased to 40 bid, and weekly metolazone added.  Echo with LVEF 20-25% 01/2023.   He had diuresed > 30 lbs but continued with NYHA IIIb symptoms and transplant eval.     He is pending visit at [facility] 10/14.  Pt presented to [facility] after a syncopal episode while riding in the car with his wife. Had hot, nauseated, and dizzy feeling before hand.   He did have on margarita at supper that day. He denies CP and stated his SOB was back to baseline after recent vigorous diuresis by HF team (>30 lbs down). Denies sick contacts, fever, N/V/D, or med non-compliance.    Labs Potassium3.2* (10/10 0248) Magnesium  2.2 (10/10 0248) Creatinine, ser  1.18 (10/10 0248) PLT  186 (10/10 0248) HGB  14.5 (10/10 0248) WBC 8.3 (10/10 0248) Troponin I (High Sensitivity)30* (10/10 0248).    Allergies, Medical, Surgical, Social, and Family Histories have been reviewed and are referenced here-in when relevant for medical decision making.    Physical Exam: Vitals:   03/29/23 0600 03/29/23 0615 03/29/23 0630 03/29/23 0723  BP:  98/81 96/75 101/86 98/72  Pulse: 90 92 91 92  Resp: (!) 22 19 17  (!) 23  Temp:    (!) 97.5 F (36.4 C)  TempSrc:    Oral  SpO2: 97% 96% 95% 92%    GEN- NAD, A&O x 3, normal affect HEENT: Normocephalic, atraumatic Lungs- CTAB, Normal effort.  Heart- Regular rate and rhythm, No M/G/R.  GI- Soft, NT, ND.  Extremities- No clubbing, cyanosis, or edema   Radiology/Studies: DG Chest Portable 1 View  Result Date: 03/28/2023 CLINICAL DATA:  Chest pain.  Loss of consciousness. EXAM: PORTABLE CHEST 1 VIEW COMPARISON:  Chest CT 02/26/2023. FINDINGS: Mild cardiomegaly. Left-sided pacemaker in place. Vascular congestion without overt edema. No pneumothorax or large pleural effusion. Please note the right lateral costophrenic angle is excluded from the field of view. IMPRESSION: Mild cardiomegaly with vascular congestion. Electronically Signed   By: Narda Rutherford M.D.   On: 03/28/2023 23:27    EKG: NSR with intermittent ventricular paced beats at ~100 bpm (personally reviewed)    TELEMETRY: NSR with frequent ventricular ectopy vs paced beats (personally reviewed)  DEVICE HISTORY:  [employer] BiV ICD implanted 03/19/2019 for chronic systolic heart failure History of appropriate therapy: No History of AAD therapy: Yes; currently on Mexiletine     Studies reviewed: [facility] 02/23/2023 1. Stable 3v CAD 2. Severe biventricular failure with elevated filling pressures and very low PAPi suggestive of poor RV function  Echo 02/15/2023 LVEF < 20%, RV "normal" (though poor by RHC as above), mild MR  Assessment/Plan:  VF with ICD shock PVCs Likely exacerbated by hypokalemia in setting of aggressive diuresis.  Continue mexitil  Joe [name] bolus and load IV amiodarone in the setting of VF as above.  Keep K > 4.0 and Mg > 2.0   Hypokalemia K 2.5 on arrival, had been chasing recently with aggressive diuresis.  Was on torsemide 40 mg BID with metolazone once weekly. Potassium 40 meq q am and 20 meq q pm, with an  additional 20 meq on metolazone days.  Potassium3.2* (10/10 0248) -> Aggressive supp Magnesium  2.2 (10/10 0248) Creatinine, ser  1.18 (10/10 0248) Keep K > 4.0 and Mg > 2.0   Chronic systolic CHF Volume status looks OK and reports stable NYHA III symptoms BNP 178, decreased from past several checks.  Continue GDMT and home diuretics as tolerated Low threshold to involve HF team. Joe [name] alert them to his presence.  Goal Joe [name] be to d/c over weekend so he can keep his [facility] transplant eval visit next week.   CAD HS troponin flat, and disease stable by cath last month Suspect not related to his VF Continue ranexa for now.   For questions or updates, please contact [facility] Please consult [email or web address] for contact info under Cardiology/STEMI.  Signed, Joe Freer, PA-C  03/29/2023 7:26 AM   I have seen and examined this patient with Joe [name].  Agree with above, note added to reflect my findings.  Patient presented to the hospital after an episode of syncope.  He was riding in his car.  He says that he felt warm, and could hear his wife talking, but could not understand her words.  He did have syncope and received an ICD shock for ventricular fibrillation.  He was found to be hypokalemic in the emergency room.  He is in sinus rhythm now.  No acute complaints.  GEN: Well nourished, well developed, in no acute distress  HEENT: normal  Neck: no JVD, carotid bruits, or masses Cardiac: RRR; no murmurs, rubs, or gallops,no edema  Respiratory:  clear to auscultation bilaterally, normal work of breathing GI: soft, nontender, nondistended, + BS MS: no deformity or atrophy  Skin: warm and dry, device site well healed Neuro:  Strength and sensation are intact Psych: euthymic mood, full affect   Ventricular fibrillation with ICD shock: Currently on mexiletine.  Sandrika Schwinn bolus and load with amiodarone.  Noe Pittsley plan to keep potassium greater than 4, magnesium greater than 2.  Likely  scar mediated.   Chronic systolic heart failure: Mild volume overload based on device interrogation.  Brindley Madarang continue p.o. diuresis. Hypokalemia: Potassium significantly reduced on initial labs.  Has been undergoing aggressive diuresis.  Syona Wroblewski need aggressive supplementation during hospitalization with ongoing diuresis. Coronary artery disease: No current chest pain.  No plans for left heart catheterization   [name] M. Christipher Rieger MD 03/29/2023 10:23 AM

## 2023-03-29 NOTE — Assessment & Plan Note (Signed)
Blood pressure has been running somewhat soft would attempt to hold off on some of BP meds for tonight

## 2023-03-29 NOTE — Assessment & Plan Note (Signed)
Continue Crestor 40 mg a day 

## 2023-03-29 NOTE — ED Notes (Signed)
ED TO INPATIENT HANDOFF REPORT  ED Nurse Name and Phone #: Estanislado Surgeon [phone]  S Name/Age/Gender Joe [name] 51 y.o. male Room/Bed: 019C/019C  Code Status   Code Status: Full Code  Home/SNF/Other Home Patient oriented to: self, place, time, and situation Is this baseline? Yes   Triage Complete: Triage complete  Chief Complaint Syncope [R55]  Triage Note Pt to ED BIB EMS from home with complaint of syncopal episode lasting approx 2 minutes. Patient does not recall episode - does recall wife shaking him upon waking. Reports feeling hot, nauseous, dizzy, and numbness in hands before syncopal episode. Pt endorsing mild numbness is bilateral arms but otherwise symptoms have resolved.   Hx of heart failure - has appointment with [facility] to be placed on heart transplant list   Allergies No Known Allergies  Level of Care/Admitting Diagnosis ED Disposition     ED Disposition  Admit   Condition  --   Comment  Hospital Area: [facility] [[id number]]  Level of Care: Progressive [102]  Admit to Progressive based on following criteria: CARDIOVASCULAR & THORACIC of moderate stability with acute coronary syndrome symptoms/low risk myocardial infarction/hypertensive urgency/arrhythmias/heart failure potentially compromising stability and stable post cardiovascular intervention patients.  May place patient in observation at [facility] or [facility] if equivalent level of care is available:: No  Covid Evaluation: Asymptomatic - no recent exposure (last 10 days) testing not required  Diagnosis: Syncope [206001]  Admitting Physician: Therisa Doyne [[id number]]  Attending Physician: Therisa Doyne [[id number]]          B Medical/Surgery History Past Medical History:  Diagnosis Date   Abnormal EKG    Acute systolic heart failure (HCC) 09/06/2017   Angina pectoris (HCC) 09/05/2017   Body mass index 45.0-49.9, adult (HCC)    CAD S/P percutaneous coronary  angioplasty 09/07/2017   mLAD and dLAD PCI with DES 09/06/17   CHF (congestive heart failure) (HCC)    Chronic systolic (congestive) heart failure (HCC) 03/19/2019   Complication of anesthesia 1995   "had hard time waking up; breathing w/vasectomy"   Coronary artery disease    Erectile dysfunction    Essential hypertension, benign    Fatigue    GERD (gastroesophageal reflux disease)    Gout    High cholesterol    "just started tx yesterday" (09/06/2017)   History of gout    "RX prn" (09/06/2017)   Ischemic cardiomyopathy 09/07/2017   EF 25-35%   Muscular chest pain    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   Below Left Nare (MOH's)   Obesity    Obstructive sleep apnea    OSA on CPAP    Plantar fasciitis    Pre-operative clearance 02/11/2018   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/02/2019   [facility]   Right bundle branch block    Shortness of breath 09/05/2017   Testosterone deficiency    Type 2 diabetes mellitus with complication, without long-term current use of insulin (HCC) 09/05/2017   Type II diabetes mellitus (HCC)    "started tx 08/28/2017"   Past Surgical History:  Procedure Laterality Date   BACK SURGERY     BIV ICD INSERTION CRT-D N/A 03/19/2019   Procedure: BIV ICD INSERTION CRT-D;  Surgeon: Regan Lemming, MD;  Location: [facility] INVASIVE CV LAB;  Service: Cardiovascular;  Laterality: N/A;   CORONARY ANGIOPLASTY WITH STENT PLACEMENT  09/06/2017   "3 stents"   CORONARY STENT INTERVENTION N/A 09/06/2017   Procedure: CORONARY STENT  INTERVENTION;  Surgeon: Corky Crafts, MD;  Location: [facility] INVASIVE CV LAB;  Service: Cardiovascular;  Laterality: N/A;   CORONARY STENT INTERVENTION N/A 02/15/2018   Procedure: CORONARY STENT INTERVENTION;  Surgeon: Corky Crafts, MD;  Location: [facility] INVASIVE CV LAB;  Service: Cardiovascular;  Laterality: N/A;   KNEE ARTHROSCOPY Right    LEFT HEART CATH AND CORONARY ANGIOGRAPHY N/A 09/06/2017    Procedure: LEFT HEART CATH AND CORONARY ANGIOGRAPHY;  Surgeon: Corky Crafts, MD;  Location: [facility] INVASIVE CV LAB;  Service: Cardiovascular;  Laterality: N/A;   LEFT HEART CATH AND CORONARY ANGIOGRAPHY N/A 02/15/2018   Procedure: LEFT HEART CATH AND CORONARY ANGIOGRAPHY;  Surgeon: Corky Crafts, MD;  Location: [facility] INVASIVE CV LAB;  Service: Cardiovascular;  Laterality: N/A;   LEFT HEART CATH AND CORONARY ANGIOGRAPHY N/A 06/27/2018   Procedure: LEFT HEART CATH AND CORONARY ANGIOGRAPHY;  Surgeon: Corky Crafts, MD;  Location: [facility] INVASIVE CV LAB;  Service: Cardiovascular;  Laterality: N/A;   LEFT HEART CATH AND CORONARY ANGIOGRAPHY N/A 06/28/2020   Procedure: LEFT HEART CATH AND CORONARY ANGIOGRAPHY;  Surgeon: Swaziland, Peter M, MD;  Location: [facility] INVASIVE CV LAB;  Service: Cardiovascular;  Laterality: N/A;   LUMBAR SPINE SURGERY  ~ 2001   Intradiscal Electrothermoplasty   RIGHT/LEFT HEART CATH AND CORONARY ANGIOGRAPHY N/A 03/07/2021   Procedure: RIGHT/LEFT HEART CATH AND CORONARY ANGIOGRAPHY;  Surgeon: Laurey Morale, MD;  Location: [facility] INVASIVE CV LAB;  Service: Cardiovascular;  Laterality: N/A;   RIGHT/LEFT HEART CATH AND CORONARY ANGIOGRAPHY N/A 02/23/2023   Procedure: RIGHT/LEFT HEART CATH AND CORONARY ANGIOGRAPHY;  Surgeon: Dolores Patty, MD;  Location: [facility] INVASIVE CV LAB;  Service: Cardiovascular;  Laterality: N/A;   VASECTOMY  1995     A IV Location/Drains/Wounds Patient Lines/Drains/Airways Status     Active Line/Drains/Airways     Name Placement date Placement time Site Days   Peripheral IV 03/28/23 20 G Posterior;Right Forearm 03/28/23  2206  Forearm  1            Intake/Output Last 24 hours  Intake/Output Summary (Last 24 hours) at 03/29/2023 1225 Last data filed at 03/29/2023 0706 Gross per 24 hour  Intake 397.95 ml  Output --  Net 397.95 ml    Labs/Imaging Results for orders placed or performed during the hospital encounter of 03/28/23 (from the  past 48 hour(s))  CBG monitoring, ED     Status: Abnormal   Collection Time: 03/28/23  9:41 PM  Result Value Ref Range   Glucose-Capillary 204 (H) 70 - 99 mg/dL    Comment: Glucose reference range applies only to samples taken after fasting for at least 8 hours.  TSH     Status: None   Collection Time: 03/28/23 10:04 PM  Result Value Ref Range   TSH 3.878 0.350 - 4.500 uIU/mL    Comment: Performed by a 3rd Generation assay with a functional sensitivity of <=0.01 uIU/mL. Performed at [facility] Lab, [address] [place], [place] [address]   Hemoglobin A1c     Status: Abnormal   Collection Time: 03/28/23 10:04 PM  Result Value Ref Range   Hgb A1c MFr Bld 7.5 (H) 4.8 - 5.6 %    Comment: (NOTE) Pre diabetes:          5.7%-6.4%  Diabetes:              >6.4%  Glycemic control for   <7.0% adults with diabetes    Mean Plasma Glucose 168.55 mg/dL  Comment: Performed at [facility], [address], [place], [place] [address]  Comprehensive metabolic panel     Status: Abnormal   Collection Time: 03/28/23 10:05 PM  Result Value Ref Range   Sodium 134 (L) 135 - 145 mmol/L   Potassium 2.5 (LL) 3.5 - 5.1 mmol/L    Comment: CRITICAL RESULT CALLED TO, READ BACK BY AND VERIFIED WITH STEWART, H. PARAMEDIC @ [phone] 03/28/23 JBUTLER   Chloride 94 (L) 98 - 111 mmol/L   CO2 26 22 - 32 mmol/L   Glucose, Bld 206 (H) 70 - 99 mg/dL    Comment: Glucose reference range applies only to samples taken after fasting for at least 8 hours.   BUN 13 6 - 20 mg/dL   Creatinine, Ser 4.09 0.61 - 1.24 mg/dL   Calcium 8.9 8.9 - 81.1 mg/dL   Total Protein 7.5 6.5 - 8.1 g/dL   Albumin 3.5 3.5 - 5.0 g/dL   AST 27 15 - 41 U/L   ALT 25 0 - 44 U/L   Alkaline Phosphatase 87 38 - 126 U/L   Total Bilirubin 1.2 0.3 - 1.2 mg/dL   GFR, Estimated >91 >47 mL/min    Comment: (NOTE) Calculated using the CKD-EPI Creatinine Equation (2021)    Anion gap 14 5 - 15    Comment: Performed at [facility],  [address], [place], [place] [address]  CBC with Differential     Status: None   Collection Time: 03/28/23 10:05 PM  Result Value Ref Range   WBC 8.7 4.0 - 10.5 K/uL   RBC 4.98 4.22 - 5.81 MIL/uL   Hemoglobin 15.3 13.0 - 17.0 g/dL   HCT 21.3 08.6 - 57.8 %   MCV 88.8 80.0 - 100.0 fL   MCH 30.7 26.0 - 34.0 pg   MCHC 34.6 30.0 - 36.0 g/dL   RDW 46.9 62.9 - 52.8 %   Platelets 173 150 - 400 K/uL   nRBC 0.0 0.0 - 0.2 %   Neutrophils Relative % 77 %   Neutro Abs 6.7 1.7 - 7.7 K/uL   Lymphocytes Relative 11 %   Lymphs Abs 1.0 0.7 - 4.0 K/uL   Monocytes Relative 9 %   Monocytes Absolute 0.8 0.1 - 1.0 K/uL   Eosinophils Relative 2 %   Eosinophils Absolute 0.2 0.0 - 0.5 K/uL   Basophils Relative 1 %   Basophils Absolute 0.1 0.0 - 0.1 K/uL   Immature Granulocytes 0 %   Abs Immature Granulocytes 0.03 0.00 - 0.07 K/uL    Comment: Performed at [facility], [address], [place], [place] [address]  Troponin I (High Sensitivity)     Status: Abnormal   Collection Time: 03/28/23 10:05 PM  Result Value Ref Range   Troponin I (High Sensitivity) 24 (H) <18 ng/L    Comment: (NOTE) Elevated high sensitivity troponin I (hsTnI) values and significant  changes across serial measurements may suggest ACS but many other  chronic and acute conditions are known to elevate hsTnI results.  Refer to the "Links" section for chest pain algorithms and additional  guidance. Performed at [facility], [address] [place], [place] [address]   Brain natriuretic peptide     Status: Abnormal   Collection Time: 03/28/23 10:05 PM  Result Value Ref Range   B Natriuretic Peptide 178.3 (H) 0.0 - 100.0 pg/mL    Comment: Performed at [facility] Lab, [address], [place], [place]  [address]  Magnesium     Status: None   Collection Time: 03/28/23 10:05 PM  Result Value Ref Range   Magnesium 2.0 1.7 - 2.4 mg/dL    Comment: Performed at [facility], [address] [place], [place] [address]   Troponin I (High Sensitivity)     Status: Abnormal   Collection Time: 03/29/23 12:24 AM  Result Value Ref Range   Troponin I (High Sensitivity) 30 (H) <18 ng/L    Comment: (NOTE) Elevated high sensitivity troponin I (hsTnI) values and significant  changes across serial measurements may suggest ACS but many other  chronic and acute conditions are known to elevate hsTnI results.  Refer to the "Links" section for chest pain algorithms and additional  guidance. Performed at [facility], [address] [place], [place] [address]   Lactic acid, plasma     Status: None   Collection Time: 03/29/23 12:24 AM  Result Value Ref Range   Lactic Acid, Venous 1.3 0.5 - 1.9 mmol/L    Comment: Performed at [facility] Lab, [address], [place], [place] [address]  Blood gas, venous     Status: Abnormal   Collection Time: 03/29/23 12:24 AM  Result Value Ref Range   pH, Ven 7.48 (H) 7.25 - 7.43   pCO2, Ven 47 44 - 60 mmHg   pO2, Ven 60 (H) 32 - 45 mmHg   Bicarbonate 35.0 (H) 20.0 - 28.0 mmol/L   Acid-Base Excess 10.0 (H) 0.0 - 2.0 mmol/L   O2 Saturation 91.3 %   Patient temperature 37.0     Comment: Performed at [facility], [address] [place], [place] [address]  Procalcitonin     Status: None   Collection Time: 03/29/23 12:24 AM  Result Value Ref Range   Procalcitonin <0.10 ng/mL    Comment:        Interpretation: PCT (Procalcitonin) <= 0.5 ng/mL: Systemic infection (sepsis) is not likely. Local bacterial infection is possible. (NOTE)       Sepsis PCT Algorithm           Lower Respiratory Tract                                      Infection PCT Algorithm    ----------------------------     ----------------------------         PCT < 0.25 ng/mL                PCT < 0.10 ng/mL          Strongly encourage             Strongly discourage   discontinuation of antibiotics    initiation of antibiotics    ----------------------------     -----------------------------        PCT 0.25 - 0.50 ng/mL            PCT 0.10 - 0.25 ng/mL               OR       >80% decrease in PCT            Discourage initiation of  antibiotics      Encourage discontinuation           of antibiotics    ----------------------------     -----------------------------         PCT >= 0.50 ng/mL              PCT 0.26 - 0.50 ng/mL               AND        <80% decrease in PCT             Encourage initiation of                                             antibiotics       Encourage continuation           of antibiotics    ----------------------------     -----------------------------        PCT >= 0.50 ng/mL                  PCT > 0.50 ng/mL               AND         increase in PCT                  Strongly encourage                                      initiation of antibiotics    Strongly encourage escalation           of antibiotics                                     -----------------------------                                           PCT <= 0.25 ng/mL                                                 OR                                        > 80% decrease in PCT                                      Discontinue / Do not initiate                                             antibiotics  Performed at [facility], [address] [place], [place] [address]   D-dimer, quantitative     Status: None   Collection Time:  03/29/23 12:24 AM  Result Value Ref Range   D-Dimer, Quant 0.29 0.00 - 0.50 ug/mL-FEU    Comment: (NOTE) At the manufacturer cut-off value of 0.5 g/mL FEU, this assay has a negative predictive value of 95-100%.This assay is intended for use in conjunction with a clinical pretest probability (PTP) assessment model to exclude pulmonary embolism (PE) and deep venous thrombosis (DVT) in outpatients suspected of PE or DVT. Results should be correlated with clinical presentation. Performed at [facility],  [address], [place], [place] [address]   CK     Status: None   Collection Time: 03/29/23 12:24 AM  Result Value Ref Range   Total CK 65 49 - 397 U/L    Comment: Performed at [facility], [address] [place], [place] [address]  Phosphorus     Status: Abnormal   Collection Time: 03/29/23 12:24 AM  Result Value Ref Range   Phosphorus 5.1 (H) 2.5 - 4.6 mg/dL    Comment: Performed at [facility], [address], [place], [place] [address]  Prealbumin     Status: None   Collection Time: 03/29/23 12:24 AM  Result Value Ref Range   Prealbumin 24 18 - 38 mg/dL    Comment: Performed at [facility], [address], [place], [place] [address]  Magnesium     Status: None   Collection Time: 03/29/23 12:24 AM  Result Value Ref Range   Magnesium 2.1 1.7 - 2.4 mg/dL    Comment: Performed at [facility], [address], [place], [place] [address]  CBG monitoring, ED     Status: Abnormal   Collection Time: 03/29/23  2:14 AM  Result Value Ref Range   Glucose-Capillary 143 (H) 70 - 99 mg/dL    Comment: Glucose reference range applies only to samples taken after fasting for at least 8 hours.  Lactic acid, plasma     Status: None   Collection Time: 03/29/23  2:48 AM  Result Value Ref Range   Lactic Acid, Venous 1.3 0.5 - 1.9 mmol/L    Comment: Performed at [facility], [address], [place], [place] [address]  Troponin I (High Sensitivity)     Status: Abnormal   Collection Time: 03/29/23  2:48 AM  Result Value Ref Range   Troponin I (High Sensitivity) 30 (H) <18 ng/L    Comment: (NOTE) Elevated high sensitivity troponin I (hsTnI) values and significant  changes across serial measurements may suggest ACS but many other  chronic and acute conditions are known to elevate hsTnI results.  Refer to the "Links" section for chest pain algorithms and additional  guidance. Performed at [facility], [address], [place], [place] [address]   HIV Antibody  (routine testing w rflx)     Status: None   Collection Time: 03/29/23  2:48 AM  Result Value Ref Range   HIV Screen 4th Generation wRfx Non Reactive Non Reactive    Comment: Performed at [facility] Lab, [address], [place], [place] [address]  Magnesium     Status: None   Collection Time: 03/29/23  2:48 AM  Result Value Ref Range   Magnesium 2.2 1.7 - 2.4 mg/dL    Comment: Performed at [facility], [address], [place], [place] [address]  Phosphorus     Status: Abnormal   Collection Time: 03/29/23  2:48 AM  Result Value Ref Range   Phosphorus  6.0 (H) 2.5 - 4.6 mg/dL    Comment: Performed at [facility] Lab, [address], [place], [place] [address]  Comprehensive metabolic panel     Status: Abnormal   Collection Time: 03/29/23  2:48 AM  Result Value Ref Range   Sodium 134 (L) 135 - 145 mmol/L   Potassium 3.2 (L) 3.5 - 5.1 mmol/L   Chloride 94 (L) 98 - 111 mmol/L   CO2 29 22 - 32 mmol/L   Glucose, Bld 139 (H) 70 - 99 mg/dL    Comment: Glucose reference range applies only to samples taken after fasting for at least 8 hours.   BUN 12 6 - 20 mg/dL   Creatinine, Ser 6.04 0.61 - 1.24 mg/dL   Calcium 8.6 (L) 8.9 - 10.3 mg/dL   Total Protein 7.2 6.5 - 8.1 g/dL   Albumin 3.5 3.5 - 5.0 g/dL   AST 24 15 - 41 U/L   ALT 22 0 - 44 U/L   Alkaline Phosphatase 85 38 - 126 U/L   Total Bilirubin 1.1 0.3 - 1.2 mg/dL   GFR, Estimated >54 >09 mL/min    Comment: (NOTE) Calculated using the CKD-EPI Creatinine Equation (2021)    Anion gap 11 5 - 15    Comment: Performed at [facility], [address], [place], [place] [address]  CBC     Status: None   Collection Time: 03/29/23  2:48 AM  Result Value Ref Range   WBC 8.3 4.0 - 10.5 K/uL   RBC 4.77 4.22 - 5.81 MIL/uL   Hemoglobin 14.5 13.0 - 17.0 g/dL   HCT 47.8 29.5 - 62.1 %   MCV 89.1 80.0 - 100.0 fL   MCH 30.4 26.0 - 34.0 pg   MCHC 34.1 30.0 - 36.0 g/dL   RDW 30.8 65.7 - 84.6 %   Platelets 186 150 - 400 K/uL    nRBC 0.0 0.0 - 0.2 %    Comment: Performed at [facility] Lab, [address] [place], [place] [address]  CBG monitoring, ED     Status: Abnormal   Collection Time: 03/29/23  4:51 AM  Result Value Ref Range   Glucose-Capillary 136 (H) 70 - 99 mg/dL    Comment: Glucose reference range applies only to samples taken after fasting for at least 8 hours.  CBG monitoring, ED     Status: Abnormal   Collection Time: 03/29/23  7:27 AM  Result Value Ref Range   Glucose-Capillary 164 (H) 70 - 99 mg/dL    Comment: Glucose reference range applies only to samples taken after fasting for at least 8 hours.  CBG monitoring, ED     Status: Abnormal   Collection Time: 03/29/23 11:51 AM  Result Value Ref Range   Glucose-Capillary 206 (H) 70 - 99 mg/dL    Comment: Glucose reference range applies only to samples taken after fasting for at least 8 hours.   DG Chest Portable 1 View  Result Date: 03/28/2023 CLINICAL DATA:  Chest pain.  Loss of consciousness. EXAM: PORTABLE CHEST 1 VIEW COMPARISON:  Chest CT 02/26/2023. FINDINGS: Mild cardiomegaly. Left-sided pacemaker in place. Vascular congestion without overt edema. No pneumothorax or large pleural effusion. Please note the right lateral costophrenic angle is excluded from the field of view. IMPRESSION: Mild cardiomegaly with vascular congestion. Electronically Signed   By: Narda Rutherford M.D.   On: 03/28/2023 23:27    Pending Labs [employer] (From admission, onward)     Start  Ordered   03/30/23 0500  CBC  Tomorrow morning,   R        03/29/23 1113   03/30/23 0500  Magnesium  Tomorrow morning,   R        03/29/23 1113   03/30/23 0500  Phosphorus  Tomorrow morning,   R        03/29/23 1113   03/29/23 1500  Basic metabolic panel  Once-Timed,   TIMED        03/29/23 1035            Vitals/Pain Today's Vitals   03/29/23 0730 03/29/23 0945 03/29/23 1035 03/29/23 1116  BP: 100/83  108/83   Pulse: 92 96 93   Resp: (!) 25 19 18    Temp:     97.7 F (36.5 C)  TempSrc:    Oral  SpO2: 94% 94% 93%   PainSc:        Isolation Precautions No active isolations  Medications Medications  insulin aspart (novoLOG) injection 0-9 Units (3 Units Subcutaneous Given 03/29/23 1220)  allopurinol (ZYLOPRIM) tablet 100 mg (100 mg Oral Given 03/29/23 1042)  aspirin EC tablet 81 mg (81 mg Oral Given 03/29/23 1042)  mexiletine (MEXITIL) capsule 250 mg (250 mg Oral Not Given 03/29/23 1053)  pantoprazole (PROTONIX) EC tablet 40 mg (40 mg Oral Given 03/29/23 1039)  ranolazine (RANEXA) 12 hr tablet 500 mg (500 mg Oral Given 03/29/23 1042)  rosuvastatin (CRESTOR) tablet 40 mg (40 mg Oral Given 03/29/23 1042)  acetaminophen (TYLENOL) tablet 650 mg (650 mg Oral Given 03/29/23 1132)    Or  acetaminophen (TYLENOL) suppository 650 mg ( Rectal See Alternative 03/29/23 1132)  HYDROcodone-acetaminophen (NORCO/VICODIN) 5-325 MG per tablet 1-2 tablet (has no administration in time range)  enoxaparin (LOVENOX) injection 40 mg (has no administration in time range)  sodium chloride flush (NS) 0.9 % injection 3 mL (3 mLs Intravenous Not Given 03/29/23 1053)  sodium chloride flush (NS) 0.9 % injection 3 mL (has no administration in time range)  0.9 %  sodium chloride infusion (has no administration in time range)  gabapentin (NEURONTIN) capsule 200 mg (200 mg Oral Given 03/29/23 1039)    And  gabapentin (NEURONTIN) capsule 100 mg (100 mg Oral Not Given 03/29/23 0236)  amiodarone (NEXTERONE) 1.8 mg/mL load via infusion 150 mg (150 mg Intravenous Bolus from Bag 03/29/23 0915)    Followed by  amiodarone (NEXTERONE PREMIX) 360-4.14 MG/200ML-% (1.8 mg/mL) IV infusion (60 mg/hr Intravenous New Bag/Given 03/29/23 1129)    Followed by  amiodarone (NEXTERONE PREMIX) 360-4.14 MG/200ML-% (1.8 mg/mL) IV infusion (has no administration in time range)  torsemide (DEMADEX) tablet 40 mg (40 mg Oral Given 03/29/23 1100)  potassium chloride SA (KLOR-CON M) CR tablet 40 mEq (0  mEq Oral Hold 03/29/23 1053)  potassium chloride SA (KLOR-CON M) CR tablet 60 mEq (60 mEq Oral Given 03/28/23 2332)  potassium chloride 10 mEq in 100 mL IVPB (0 mEq Intravenous Stopped 03/29/23 0706)  potassium chloride (KLOR-CON) packet 40 mEq (40 mEq Oral Given 03/29/23 0728)    Mobility walks     Focused Assessments    R Recommendations: See Admitting Provider Note  Report given to:   Additional Notes:

## 2023-03-29 NOTE — Assessment & Plan Note (Signed)
Cardiology consult in a.m. Given soft blood pressures for tonight hold Demadex Had a recent cardiac echogram we will hold off on repeating for tonight Check orthostatics in a.m.

## 2023-03-29 NOTE — ED Notes (Signed)
Spoke to [facility]  about pacemaker interrogation. Per rep they never received a request. On call rep paged to come to the bedside and interrogate pts pacemaker

## 2023-03-29 NOTE — Assessment & Plan Note (Signed)
-  will monitor on tele avoid QT prolonging medications, rehydrate correct electrolytes ? ?

## 2023-03-30 DIAGNOSIS — R55 Syncope and collapse: Secondary | ICD-10-CM | POA: Diagnosis not present

## 2023-03-30 DIAGNOSIS — I4901 Ventricular fibrillation: Secondary | ICD-10-CM | POA: Diagnosis not present

## 2023-03-30 LAB — GLUCOSE, CAPILLARY
Glucose-Capillary: 129 mg/dL — ABNORMAL HIGH (ref 70–99)
Glucose-Capillary: 146 mg/dL — ABNORMAL HIGH (ref 70–99)
Glucose-Capillary: 179 mg/dL — ABNORMAL HIGH (ref 70–99)
Glucose-Capillary: 186 mg/dL — ABNORMAL HIGH (ref 70–99)
Glucose-Capillary: 188 mg/dL — ABNORMAL HIGH (ref 70–99)
Glucose-Capillary: 195 mg/dL — ABNORMAL HIGH (ref 70–99)

## 2023-03-30 LAB — CBC
HCT: 43.6 % (ref 39.0–52.0)
Hemoglobin: 15.1 g/dL (ref 13.0–17.0)
MCH: 30.4 pg (ref 26.0–34.0)
MCHC: 34.6 g/dL (ref 30.0–36.0)
MCV: 87.7 fL (ref 80.0–100.0)
Platelets: 187 10*3/uL (ref 150–400)
RBC: 4.97 MIL/uL (ref 4.22–5.81)
RDW: 13.4 % (ref 11.5–15.5)
WBC: 8.2 10*3/uL (ref 4.0–10.5)
nRBC: 0 % (ref 0.0–0.2)

## 2023-03-30 LAB — PHOSPHORUS: Phosphorus: 5.5 mg/dL — ABNORMAL HIGH (ref 2.5–4.6)

## 2023-03-30 LAB — MAGNESIUM: Magnesium: 2 mg/dL (ref 1.7–2.4)

## 2023-03-30 MED ORDER — INSULIN ASPART 100 UNIT/ML IJ SOLN
0.0000 [IU] | Freq: Three times a day (TID) | INTRAMUSCULAR | Status: DC
  Administered 2023-03-31: 2 [IU] via SUBCUTANEOUS
  Administered 2023-03-31: 3 [IU] via SUBCUTANEOUS
  Administered 2023-03-31: 2 [IU] via SUBCUTANEOUS
  Administered 2023-03-31: 5 [IU] via SUBCUTANEOUS
  Administered 2023-04-01: 2 [IU] via SUBCUTANEOUS

## 2023-03-30 MED ORDER — PROCHLORPERAZINE EDISYLATE 10 MG/2ML IJ SOLN
10.0000 mg | Freq: Once | INTRAMUSCULAR | Status: AC
  Administered 2023-03-30: 10 mg via INTRAVENOUS
  Filled 2023-03-30: qty 2

## 2023-03-30 MED ORDER — ONDANSETRON HCL 4 MG/2ML IJ SOLN: 4.0000 mg | Freq: Once | INTRAMUSCULAR | Status: DC

## 2023-03-30 MED ORDER — ORAL CARE MOUTH RINSE: 15.0000 mL | OROMUCOSAL | Status: DC | PRN

## 2023-03-30 NOTE — Progress Notes (Addendum)
Patient Name: Joe [name] Date of Encounter: 03/30/2023  Primary Cardiologist: [name], MD Electrophysiologist: Lanique Gonzalo Jorja Loa, MD  Interval Summary   The patient is doing well today.  At this time, the patient denies chest pain, shortness of breath, or any new concerns.  Vital Signs    Vitals:   03/29/23 1923 03/30/23 0011 03/30/23 0447 03/30/23 0753  BP: 110/81 (!) 89/72 (!) 83/61 100/74  Pulse: 91 85 80 87  Resp: 16 18 20 18   Temp: 97.6 F (36.4 C) 97.7 F (36.5 C) 97.9 F (36.6 C) 97.6 F (36.4 C)  TempSrc: Oral Oral Oral Oral  SpO2: 96% 94% 96% 93%    Intake/Output Summary (Last 24 hours) at 03/30/2023 0859 Last data filed at 03/30/2023 0500 Gross per 24 hour  Intake 481.33 ml  Output --  Net 481.33 ml   There were no vitals filed for this visit.  Physical Exam    GEN- The patient is well appearing, alert and oriented x 3 today.   Lungs- Clear to ausculation bilaterally, normal work of breathing Cardiac- Regular rate and rhythm, no murmurs, rubs or gallops GI- soft, NT, ND, + BS Extremities- no clubbing or cyanosis. No edema  Telemetry    NSR 80-90s (personally reviewed)  Hospital Course    Joe [name] is a 52 y.o. male with a history of chronic systolic CHF, CAD, HTN, GERD, gout, HLD, OSA, and obesity who is being seen today for the evaluation of syncope and ICD shock at the request of Dr. Idelle Leech.   Assessment & Plan    VF with ICD shock PVCs Likely exacerbated by hypokalemia in setting of aggressive diuresis.  Quiescent.  Continue IV amiodarone today.  Potassium3.5 (10/10 1846) Magnesium  2.0 (10/11 0554) Creatinine, ser  1.26* (10/10 1846) Keep K > 4.0 and Mg > 2.0  Continue mexitil    Hypokalemia Potassium3.5 (10/10 1846) Continue to follow on oral meds  Chronic systolic CHF Volume status looks OK and reports stable NYHA III symptoms BNP 178, decreased from past several checks.  Continue GDMT and  home diuretics as tolerated HF team aware, no need to see as long as remains stable.  Goal Joe [name] be to d/c over weekend so he can keep his [facility] transplant eval visit next week.    CAD HS troponin flat, and disease stable by cath last month Suspect not related to his VF Continue ranexa for now.   For questions or updates, please contact [facility] Please consult [email or web address] for contact info under Cardiology/STEMI.  Signed, Joe Freer, PA-C  03/30/2023, 8:59 AM   I have seen and examined this patient with Joe [name].  Agree with above, note added to reflect my findings.  Patient feeling well without acute complaint.  GEN: Well nourished, well developed, in no acute distress  HEENT: normal  Neck: no JVD, carotid bruits, or masses Cardiac: RRR; no murmurs, rubs, or gallops,no edema  Respiratory:  clear to auscultation bilaterally, normal work of breathing GI: soft, nontender, nondistended, + BS MS: no deformity or atrophy  Skin: warm and dry, device site well healed Neuro:  Strength and sensation are intact Psych: euthymic mood, full affect   Ventricular fibrillation with ICD shock: Likely exacerbated by hypokalemia in the setting of diuresis.  Joe [name] need to keep potassium greater than 4.  Currently on IV amiodarone.  Joe [name] need 48 hours of IV amiodarone.  Joe [name] switch to p.o. amiodarone tomorrow with a full load.  Likely discharge on  Sunday Hypokalemia: Continue repletion per primary team Chronic systolic heart failure: No obvious volume overload.  Has follow-up on Monday with cardiology transplant team at [facility]. Coronary artery disease: Troponin not elevated.  Continue current management.  Joe [name] M. Joe Bhargava MD 03/30/2023 2:15 PM

## 2023-03-30 NOTE — Progress Notes (Signed)
Mobility Specialist Progress Note:   03/30/23 1044  Mobility  Activity Ambulated with assistance in hallway  Level of Assistance Standby assist, set-up cues, supervision of patient - no hands on  Assistive Device  (IV Pole)  Distance Ambulated (ft) 400 ft  Activity Response Tolerated well  Mobility Referral Yes  $Mobility charge 1 Mobility  Mobility Specialist Start Time (ACUTE ONLY) 0935  Mobility Specialist Stop Time (ACUTE ONLY) 0957  Mobility Specialist Time Calculation (min) (ACUTE ONLY) 22 min   Pre Mobility: 82 HR , 102/78 BP , 94% SpO2 During Mobility: 94%-97% SpO2 Post Mobility: 66 HR , 95% SpO2  Pt received in bed, agreeable to mobility per RN request. Pt denied any SOB or discomfort during ambulation, asymptomatic.throughout. Pt returned to bed with call bell in reach with all needs met.    Leory Plowman  Mobility Specialist Please contact via [employer] office at [phone]

## 2023-03-30 NOTE — Progress Notes (Signed)
PROGRESS NOTE    Joe [name]  ZOX:[id number] DOB: [birth year] DOA: 03/28/2023 PCP: Lonie Peak, PA-C   Brief Narrative:  This 52 yrs old Male with h/o morbid obesity, DM2, HTN, Hyperlipidemia, OSA, CAD and systolic HF (LVEF 20-25%) referred by Dr. Bing Matter for further management of his HF.  Patient does not remember what happened.  Patient reports he was feeling hot, nauseous and dizzy,  his arms felt numb prior to syncope.  Patient is following up with heart failure team at [facility],  He denies that his AICD did not fire.  He took an extra dose of metolazone without any improvement.  Patient is admitted for further evaluation.  Assessment & Plan:   Principal Problem:   Syncope Active Problems:   Obstructive sleep apnea   Essential hypertension, benign   Obesity   Gout   Type 2 diabetes mellitus with complication, without long-term current use of insulin (HCC)   CAD S/P percutaneous coronary angioplasty   Chronic systolic CHF (congestive heart failure) (HCC)   Hyperlipidemia   OSA on CPAP   Hypokalemia   Prolonged QT interval  Syncope due to V-fib with ICD shock: Patient presented with syncopal event likely secondary to V-fib with ICD shock. Pacemaker interrogation completed. Cardiology evaluation completed.  Patient is loaded with amiodarone.   Keep potassium above 4 magnesium above 2. Cardiology is following.  Continue amiodarone IV infusion x 48 hrs. Will switch to oral amiodarone tomorrow..  Chronic systolic CHF (congestive heart failure) (HCC) Volume status looks okay.  BNP 178. Continue GDMT and home diuretics as tolerated. Goal is to DC over the weekend so he can keep up with his [facility] transplant evaluation next week.   Essential hypertension: Hold blood pressure medications as blood pressure is running on soft side.   Obesity: Diet and exercise discussed in detail.   Gout: Chronic stable. Continue home medications. Including allopurinol   Type 2  diabetes mellitus with complication: Hold Ozempic and jardiance. Order SSI   CAD S/P percutaneous coronary angioplasty Denies chest pain or shortness of breath. Continue crestor 40mg  po Q day, aspirin 81 mg po Q day. Continue Ranexa.   Hyperlipidemia: Continue Crestor 40 mg a day.   OSA on CPAP Not compliant with CPAP.   Hypokalemia Replaced.  Continue to monitor.   DVT prophylaxis: SCDs Code Status: Full code Family Communication: Family at bed side. Disposition Plan:   Status is: Inpatient Remains inpatient appropriate because:      Admitted for syncope sec to ICD shock.   Consultants:  Cardiology  Procedures: AICD interrogation.  Antimicrobials: Anti-infectives (From admission, onward)    None      Subjective: Patient was seen and examined at bedside.Overnight events noted. Patient reports feeling better, denies any dizzy spells. He was sitting comfortably on the bed.  States he is feeling well. Patient is started on amiodarone infusion for arrhythmias.  Objective: Vitals:   03/30/23 0447 03/30/23 0753 03/30/23 1139 03/30/23 1316  BP: (!) 83/61 100/74 94/75 99/61   Pulse: 80 87 93 99  Resp: 20 18 20 15   Temp: 97.9 F (36.6 C) 97.6 F (36.4 C) 98.1 F (36.7 C) (!) 97.5 F (36.4 C)  TempSrc: Oral Oral Oral Oral  SpO2: 96% 93% 95% 96%  Weight:    (!) 139.6 kg  Height:    6\' 3"  (1.905 m)    Intake/Output Summary (Last 24 hours) at 03/30/2023 1435 Last data filed at 03/30/2023 0900 Gross per 24 hour  Intake 721.33 ml  Output --  Net 721.33 ml   Filed Weights   03/30/23 1316  Weight: (!) 139.6 kg    Examination:  General exam: Appears calm and comfortable, not in any acute distress. Respiratory system: CTA bilaterally.  Respiratory effort normal.  RR 14. Cardiovascular system: S1 & S2 heard, regular rate and rhythm, murmur noted.. No pedal edema. Gastrointestinal system: Abdomen is non distended, soft and non tender.  Normal bowel sounds  heard. Central nervous system: Alert and oriented x 3. No focal neurological deficits. Extremities: No edema, no cyanosis, no clubbing Skin: No rashes, lesions or ulcers Psychiatry: Judgement and insight appear normal. Mood & affect appropriate.     Data Reviewed: I have personally reviewed following labs and imaging studies  CBC: Recent Labs  Lab 03/28/23 2205 03/29/23 0248 03/30/23 0554  WBC 8.7 8.3 8.2  NEUTROABS 6.7  --   --   HGB 15.3 14.5 15.1  HCT 44.2 42.5 43.6  MCV 88.8 89.1 87.7  PLT 173 186 187   Basic Metabolic Panel: Recent Labs  Lab 03/28/23 2205 03/29/23 0024 03/29/23 0248 03/29/23 1846 03/30/23 0554  NA 134*  --  134* 137  --   K 2.5*  --  3.2* 3.5  --   CL 94*  --  94* 91*  --   CO2 26  --  29 31  --   GLUCOSE 206*  --  139* 146*  --   BUN 13  --  12 15  --   CREATININE 1.13  --  1.18 1.26*  --   CALCIUM 8.9  --  8.6* 9.1  --   MG 2.0 2.1 2.2  --  2.0  PHOS  --  5.1* 6.0*  --  5.5*   GFR: Estimated Creatinine Clearance: 103.3 mL/min (A) (by C-G formula based on SCr of 1.26 mg/dL (H)). Liver Function Tests: Recent Labs  Lab 03/28/23 2205 03/29/23 0248  AST 27 24  ALT 25 22  ALKPHOS 87 85  BILITOT 1.2 1.1  PROT 7.5 7.2  ALBUMIN 3.5 3.5   No results for input(s): "LIPASE", "AMYLASE" in the last 168 hours. No results for input(s): "AMMONIA" in the last 168 hours. Coagulation Profile: No results for input(s): "INR", "PROTIME" in the last 168 hours. Cardiac Enzymes: Recent Labs  Lab 03/29/23 0024  CKTOTAL 65   BNP (last 3 results) Recent Labs    05/10/22 0905 08/10/22 1534  PROBNP 475* 351*   HbA1C: Recent Labs    03/28/23 2204  HGBA1C 7.5*   CBG: Recent Labs  Lab 03/29/23 2203 03/30/23 0013 03/30/23 0449 03/30/23 0840 03/30/23 1141  GLUCAP 179* 146* 188* 195* 186*   Lipid Profile: No results for input(s): "CHOL", "HDL", "LDLCALC", "TRIG", "CHOLHDL", "LDLDIRECT" in the last 72 hours. Thyroid Function Tests: Recent  Labs    03/28/23 2204  TSH 3.878   Anemia Panel: No results for input(s): "VITAMINB12", "FOLATE", "FERRITIN", "TIBC", "IRON", "RETICCTPCT" in the last 72 hours. Sepsis Labs: Recent Labs  Lab 03/29/23 0024 03/29/23 0248  PROCALCITON <0.10  --   LATICACIDVEN 1.3 1.3    No results found for this or any previous visit (from the past 240 hour(s)).   Radiology Studies: DG Chest Portable 1 View  Result Date: 03/28/2023 CLINICAL DATA:  Chest pain.  Loss of consciousness. EXAM: PORTABLE CHEST 1 VIEW COMPARISON:  Chest CT 02/26/2023. FINDINGS: Mild cardiomegaly. Left-sided pacemaker in place. Vascular congestion without overt edema. No pneumothorax or large pleural effusion. Please note the right  lateral costophrenic angle is excluded from the field of view. IMPRESSION: Mild cardiomegaly with vascular congestion. Electronically Signed   By: Narda Rutherford M.D.   On: 03/28/2023 23:27    Scheduled Meds:  allopurinol  100 mg Oral Daily   aspirin EC  81 mg Oral Daily   enoxaparin (LOVENOX) injection  40 mg Subcutaneous Q24H   gabapentin  200 mg Oral q morning   And   gabapentin  100 mg Oral QHS   insulin aspart  0-9 Units Subcutaneous Q4H   mexiletine  250 mg Oral BID   pantoprazole  40 mg Oral Daily   potassium chloride  40 mEq Oral BID   ranolazine  500 mg Oral BID   rosuvastatin  40 mg Oral Daily   sodium chloride flush  3 mL Intravenous Q12H   torsemide  40 mg Oral BID   Continuous Infusions:  amiodarone 30 mg/hr (03/30/23 1008)     LOS: 1 day    Time spent: 35 mins    Willeen Niece, MD [facility]   If 7PM-7AM, please contact night-coverage

## 2023-03-30 NOTE — Progress Notes (Signed)
Nurse requested Mobility Specialist to perform oxygen saturation test with pt which includes removing pt from oxygen both at rest and while ambulating.  Below are the results from that testing.     Patient Saturations on Room Air at Rest = spO2 94%  Patient Saturations on Room Air while Ambulating = sp02 94% .  Rested and performed pursed lip breathing for 1 minute with sp02 at 97%.  Reported results to nurse.

## 2023-03-31 DIAGNOSIS — I472 Ventricular tachycardia, unspecified: Secondary | ICD-10-CM | POA: Diagnosis not present

## 2023-03-31 DIAGNOSIS — R55 Syncope and collapse: Secondary | ICD-10-CM | POA: Diagnosis not present

## 2023-03-31 LAB — BASIC METABOLIC PANEL
Anion gap: 14 (ref 5–15)
Anion gap: 13 (ref 5–15)
BUN: 17 mg/dL (ref 6–20)
BUN: 18 mg/dL (ref 6–20)
CO2: 29 mmol/L (ref 22–32)
CO2: 28 mmol/L (ref 22–32)
Calcium: 8.7 mg/dL — ABNORMAL LOW (ref 8.9–10.3)
Calcium: 8.4 mg/dL — ABNORMAL LOW (ref 8.9–10.3)
Chloride: 94 mmol/L — ABNORMAL LOW (ref 98–111)
Chloride: 95 mmol/L — ABNORMAL LOW (ref 98–111)
Creatinine, Ser: 1.25 mg/dL — ABNORMAL HIGH (ref 0.61–1.24)
Creatinine, Ser: 1.8 mg/dL — ABNORMAL HIGH (ref 0.61–1.24)
GFR, Estimated: >60 mL/min (ref >60)
GFR, Estimated: 45 mL/min — ABNORMAL LOW (ref >60)
Glucose, Bld: 162 mg/dL — ABNORMAL HIGH (ref 70–99)
Glucose, Bld: 176 mg/dL — ABNORMAL HIGH (ref 70–99)
Potassium: 2.7 mmol/L — CL (ref 3.5–5.1)
Potassium: 3.3 mmol/L — ABNORMAL LOW (ref 3.5–5.1)
Sodium: 137 mmol/L (ref 135–145)
Sodium: 136 mmol/L (ref 135–145)

## 2023-03-31 LAB — CBC
HCT: 41 % (ref 39.0–52.0)
Hemoglobin: 14.4 g/dL (ref 13.0–17.0)
MCH: 30.6 pg (ref 26.0–34.0)
MCHC: 35.1 g/dL (ref 30.0–36.0)
MCV: 87 fL (ref 80.0–100.0)
Platelets: 173 10*3/uL (ref 150–400)
RBC: 4.71 MIL/uL (ref 4.22–5.81)
RDW: 13.4 % (ref 11.5–15.5)
WBC: 9.3 10*3/uL (ref 4.0–10.5)
nRBC: 0 % (ref 0.0–0.2)

## 2023-03-31 LAB — PHOSPHORUS: Phosphorus: 4.3 mg/dL (ref 2.5–4.6)

## 2023-03-31 LAB — GLUCOSE, CAPILLARY
Glucose-Capillary: 152 mg/dL — ABNORMAL HIGH (ref 70–99)
Glucose-Capillary: 259 mg/dL — ABNORMAL HIGH (ref 70–99)
Glucose-Capillary: 154 mg/dL — ABNORMAL HIGH (ref 70–99)
Glucose-Capillary: 204 mg/dL — ABNORMAL HIGH (ref 70–99)

## 2023-03-31 LAB — MAGNESIUM: Magnesium: 1.9 mg/dL (ref 1.7–2.4)

## 2023-03-31 MED ORDER — POTASSIUM CHLORIDE CRYS ER 20 MEQ PO TBCR
40.0000 meq | EXTENDED_RELEASE_TABLET | Freq: Two times a day (BID) | ORAL | Status: DC
  Administered 2023-03-31: 40 meq via ORAL
  Filled 2023-03-31: qty 2

## 2023-03-31 MED ORDER — POTASSIUM CHLORIDE 20 MEQ PO PACK: 40.0000 meq | PACK | ORAL | Status: DC

## 2023-03-31 MED ORDER — AMIODARONE HCL 200 MG PO TABS
400.0000 mg | ORAL_TABLET | Freq: Two times a day (BID) | ORAL | Status: DC
  Administered 2023-03-31 – 2023-04-01 (×3): 400 mg via ORAL
  Filled 2023-03-31 (×3): qty 2

## 2023-03-31 MED ORDER — POTASSIUM CHLORIDE 10 MEQ/100ML IV SOLN
10.0000 meq | INTRAVENOUS | Status: AC
  Administered 2023-03-31 (×4): 10 meq via INTRAVENOUS
  Filled 2023-03-31 (×4): qty 100

## 2023-03-31 MED ORDER — POTASSIUM CHLORIDE CRYS ER 20 MEQ PO TBCR
40.0000 meq | EXTENDED_RELEASE_TABLET | Freq: Three times a day (TID) | ORAL | Status: DC
  Administered 2023-03-31 – 2023-04-01 (×3): 40 meq via ORAL
  Filled 2023-03-31 (×3): qty 2

## 2023-03-31 MED ORDER — ENOXAPARIN SODIUM 80 MG/0.8ML IJ SOSY
70.0000 mg | PREFILLED_SYRINGE | INTRAMUSCULAR | Status: DC
  Administered 2023-03-31: 70 mg via SUBCUTANEOUS
  Filled 2023-03-31: qty 0.8

## 2023-03-31 NOTE — Progress Notes (Signed)
   Rounding Note    Patient Name: Joe [name] Date of Encounter: 03/31/2023  [facility] Cardiologist: [name], MD   Subjective   NAEO.   Vital Signs    Vitals:   03/30/23 1926 03/30/23 1959 03/30/23 2330 03/31/23 0645  BP:  97/71 93/76 96/72   Pulse: 87 84  87  Resp: 14 20 20 20   Temp:  97.6 F (36.4 C) 97.9 F (36.6 C) 98.5 F (36.9 C)  TempSrc:  Oral Oral Oral  SpO2: 90% 100% 100% 100%  Weight:      Height:        Intake/Output Summary (Last 24 hours) at 03/31/2023 0944 Last data filed at 03/31/2023 0200 Gross per 24 hour  Intake 344.27 ml  Output --  Net 344.27 ml      03/30/2023    1:16 PM 03/09/2023   12:15 PM 02/23/2023    2:36 PM  Last 3 Weights  Weight (lbs) 307 lb 12.8 oz 310 lb 306 lb  Weight (kg) 139.617 kg 140.615 kg 138.801 kg      Telemetry    No sustained arrhythmias - Personally Reviewed  ECG     - Personally Reviewed  Physical Exam    GEN: No acute distress.   Cardiac: RRR, no murmurs, rubs, or gallops.  Respiratory: Clear to auscultation bilaterally. Psych: Normal affect   Assessment & Plan    #VF #ICD Cont amio, transition to PO today - replete K and Mag today.  #Chronic systolic HF Improved volume status [facility] transplant eval planned  #CAD Recent cath with stable disease. No ischemic symptoms      Sheria Lang T. Lalla Brothers, MD, [facility], [facility]iac Electrophys[facility]

## 2023-03-31 NOTE — Progress Notes (Signed)
Critical K of 2.7, md paged new orders received see MD note

## 2023-03-31 NOTE — Progress Notes (Signed)
Persistent hypokalemia: Patient is morning lab check showing potassium 2.7 - Per chart review patient is already on Klor-Con 40 mEq twice daily.  Ordering IV KCl 67M EQ x 4 doses.   Tereasa Coop, MD [facility] 03/31/2023, 6:48 AM

## 2023-03-31 NOTE — Progress Notes (Signed)
PROGRESS NOTE    Joe [name]  UJW:[id number] DOB: [birth year] DOA: 03/28/2023 PCP: Lonie Peak, PA-C   Brief Narrative:  This 52 yrs old Male with h/o morbid obesity, DM2, HTN, Hyperlipidemia, OSA, CAD and systolic HF (LVEF 20-25%) referred by Dr. Bing Matter for further management of his HF.  Patient does not remember what happened.  Patient reports he was feeling hot, nauseous and dizzy,  his arms felt numb prior to syncope.  Patient is following up with heart failure team at [facility],  He denies that his AICD did not fire.  He took an extra dose of metolazone without any improvement.  Patient is admitted for further evaluation.  Assessment & Plan:   Principal Problem:   Syncope Active Problems:   Obstructive sleep apnea   Essential hypertension, benign   Obesity   Gout   Type 2 diabetes mellitus with complication, without long-term current use of insulin (HCC)   CAD S/P percutaneous coronary angioplasty   Chronic systolic CHF (congestive heart failure) (HCC)   Hyperlipidemia   OSA on CPAP   Hypokalemia   Prolonged QT interval  Syncope due to V-fib with ICD shock: Patient presented with syncopal event likely secondary to V-fib with ICD shock. Pacemaker interrogation completed. Cardiology evaluation completed.  Patient is loaded with amiodarone.   Keep potassium above 4 magnesium above 2. Cardiology is following.  Continue amiodarone IV infusion x 48 hrs. IV amiodarone switched to amiodarone 400 p.o. twice daily today.  Chronic systolic CHF (congestive heart failure) (HCC) Volume status looks okay.  BNP 178. Continue GDMT and home diuretics as tolerated. Goal is to DC over the weekend so he can keep up with his [facility] transplant evaluation next week.   Essential hypertension: Hold blood pressure medications as blood pressure is running on soft side.   Obesity: Diet and exercise discussed in detail.   Gout: Chronic stable. Continue home medications. Including  allopurinol   Type 2 diabetes mellitus with complication: Hold Ozempic and jardiance. Order SSI   CAD S/P percutaneous coronary angioplasty Denies chest pain or shortness of breath. Continue crestor 40mg  po Q day, aspirin 81 mg po Q day. Continue Ranexa.   Hyperlipidemia: Continue Crestor 40 mg a day.   OSA on CPAP Not compliant with CPAP.   Hypokalemia Replaced.  Continue to monitor.   DVT prophylaxis: SCDs Code Status: Full code Family Communication: Family at bed side. Disposition Plan:   Status is: Inpatient Remains inpatient appropriate because:      Admitted for syncope sec to ICD shock.  Anticipated discharge home tomorrow.   Consultants:  Cardiology  Procedures: AICD interrogation.  Antimicrobials: Anti-infectives (From admission, onward)    None      Subjective: Patient was seen and examined at bedside.Overnight events noted. Patient reports feeling much improved.  Denies any dizzy spells. He feels well.States cardiologist told him he would be discharged tomorrow.  Objective: Vitals:   03/30/23 1959 03/30/23 2330 03/31/23 0645 03/31/23 1100  BP: 97/71 93/76 96/72  107/79  Pulse: 84  87 94  Resp: 20 20 20 18   Temp: 97.6 F (36.4 C) 97.9 F (36.6 C) 98.5 F (36.9 C) 97.8 F (36.6 C)  TempSrc: Oral Oral Oral Oral  SpO2: 100% 100% 100% 96%  Weight:      Height:        Intake/Output Summary (Last 24 hours) at 03/31/2023 1341 Last data filed at 03/31/2023 1300 Gross per 24 hour  Intake 344.27 ml  Output 950 ml  Net -  605.73 ml   Filed Weights   03/30/23 1316  Weight: (!) 139.6 kg    Examination:  General exam: Appears calm and comfortable, not in any acute distress. Respiratory system: CTA bilaterally.  Respiratory effort normal.  RR 13 Cardiovascular system: S1 & S2 heard, regular rate and rhythm, murmur noted.. No pedal edema. Gastrointestinal system: Abdomen is non distended, soft and non tender.  Normal bowel sounds  heard. Central nervous system: Alert and oriented x 3. No focal neurological deficits. Extremities: No edema, no cyanosis, no clubbing Skin: No rashes, lesions or ulcers Psychiatry: Judgement and insight appear normal. Mood & affect appropriate.     Data Reviewed: I have personally reviewed following labs and imaging studies  CBC: Recent Labs  Lab 03/28/23 2205 03/29/23 0248 03/30/23 0554 03/31/23 0439  WBC 8.7 8.3 8.2 9.3  NEUTROABS 6.7  --   --   --   HGB 15.3 14.5 15.1 14.4  HCT 44.2 42.5 43.6 41.0  MCV 88.8 89.1 87.7 87.0  PLT 173 186 187 173   Basic Metabolic Panel: Recent Labs  Lab 03/28/23 2205 03/29/23 0024 03/29/23 0248 03/29/23 1846 03/30/23 0554 03/31/23 0439  NA 134*  --  134* 137  --  137  K 2.5*  --  3.2* 3.5  --  2.7*  CL 94*  --  94* 91*  --  94*  CO2 26  --  29 31  --  29  GLUCOSE 206*  --  139* 146*  --  162*  BUN 13  --  12 15  --  17  CREATININE 1.13  --  1.18 1.26*  --  1.25*  CALCIUM 8.9  --  8.6* 9.1  --  8.7*  MG 2.0 2.1 2.2  --  2.0 1.9  PHOS  --  5.1* 6.0*  --  5.5* 4.3   GFR: Estimated Creatinine Clearance: 104.1 mL/min (A) (by C-G formula based on SCr of 1.25 mg/dL (H)). Liver Function Tests: Recent Labs  Lab 03/28/23 2205 03/29/23 0248  AST 27 24  ALT 25 22  ALKPHOS 87 85  BILITOT 1.2 1.1  PROT 7.5 7.2  ALBUMIN 3.5 3.5   No results for input(s): "LIPASE", "AMYLASE" in the last 168 hours. No results for input(s): "AMMONIA" in the last 168 hours. Coagulation Profile: No results for input(s): "INR", "PROTIME" in the last 168 hours. Cardiac Enzymes: Recent Labs  Lab 03/29/23 0024  CKTOTAL 65   BNP (last 3 results) Recent Labs    05/10/22 0905 08/10/22 1534  PROBNP 475* 351*   HbA1C: Recent Labs    03/28/23 2204  HGBA1C 7.5*   CBG: Recent Labs  Lab 03/30/23 1141 03/30/23 1613 03/30/23 2002 03/31/23 0631 03/31/23 1058  GLUCAP 186* 129* 179* 152* 259*   Lipid Profile: No results for input(s): "CHOL",  "HDL", "LDLCALC", "TRIG", "CHOLHDL", "LDLDIRECT" in the last 72 hours. Thyroid Function Tests: Recent Labs    03/28/23 2204  TSH 3.878   Anemia Panel: No results for input(s): "VITAMINB12", "FOLATE", "FERRITIN", "TIBC", "IRON", "RETICCTPCT" in the last 72 hours. Sepsis Labs: Recent Labs  Lab 03/29/23 0024 03/29/23 0248  PROCALCITON <0.10  --   LATICACIDVEN 1.3 1.3    No results found for this or any previous visit (from the past 240 hour(s)).   Radiology Studies: No results found.  Scheduled Meds:  allopurinol  100 mg Oral Daily   amiodarone  400 mg Oral BID   aspirin EC  81 mg Oral Daily  enoxaparin (LOVENOX) injection  70 mg Subcutaneous Q24H   gabapentin  200 mg Oral q morning   And   gabapentin  100 mg Oral QHS   insulin aspart  0-9 Units Subcutaneous TID AC & HS   mexiletine  250 mg Oral BID   potassium chloride  40 mEq Oral TID   ranolazine  500 mg Oral BID   rosuvastatin  40 mg Oral Daily   sodium chloride flush  3 mL Intravenous Q12H   torsemide  40 mg Oral BID   Continuous Infusions:     LOS: 2 days    Time spent: 35 mins    Willeen Niece, MD [facility]   If 7PM-7AM, please contact night-coverage

## 2023-04-01 DIAGNOSIS — R55 Syncope and collapse: Secondary | ICD-10-CM | POA: Diagnosis not present

## 2023-04-01 DIAGNOSIS — I472 Ventricular tachycardia, unspecified: Secondary | ICD-10-CM

## 2023-04-01 LAB — MAGNESIUM: Magnesium: 2 mg/dL (ref 1.7–2.4)

## 2023-04-01 LAB — CBC
HCT: 39.6 % (ref 39.0–52.0)
Hemoglobin: 14.1 g/dL (ref 13.0–17.0)
MCH: 32 pg (ref 26.0–34.0)
MCHC: 35.6 g/dL (ref 30.0–36.0)
MCV: 89.8 fL (ref 80.0–100.0)
Platelets: 161 10*3/uL (ref 150–400)
RBC: 4.41 MIL/uL (ref 4.22–5.81)
RDW: 13.6 % (ref 11.5–15.5)
WBC: 8.6 10*3/uL (ref 4.0–10.5)
nRBC: 0 % (ref 0.0–0.2)

## 2023-04-01 LAB — BASIC METABOLIC PANEL
Anion gap: 13 (ref 5–15)
BUN: 20 mg/dL (ref 6–20)
CO2: 29 mmol/L (ref 22–32)
Calcium: 8.7 mg/dL — ABNORMAL LOW (ref 8.9–10.3)
Chloride: 94 mmol/L — ABNORMAL LOW (ref 98–111)
Creatinine, Ser: 1.32 mg/dL — ABNORMAL HIGH (ref 0.61–1.24)
GFR, Estimated: >60 mL/min (ref >60)
Glucose, Bld: 167 mg/dL — ABNORMAL HIGH (ref 70–99)
Potassium: 3.4 mmol/L — ABNORMAL LOW (ref 3.5–5.1)
Sodium: 136 mmol/L (ref 135–145)

## 2023-04-01 LAB — GLUCOSE, CAPILLARY: Glucose-Capillary: 163 mg/dL — ABNORMAL HIGH (ref 70–99)

## 2023-04-01 LAB — PHOSPHORUS: Phosphorus: 3.3 mg/dL (ref 2.5–4.6)

## 2023-04-01 MED ORDER — AMIODARONE HCL 400 MG PO TABS: ORAL_TABLET | ORAL | 5 refills | Status: DC

## 2023-04-01 NOTE — Discharge Summary (Signed)
Physician Discharge Summary  Joe [name] WUJ:[id number] DOB: 1971/01/02 DOA: 03/28/2023  PCP: Lonie Peak, PA-C  Admit date: 03/28/2023  Discharge date: 04/01/2023  Admitted From: Home.  Disposition:  Home.  Recommendations for Outpatient Follow-up:  Follow up with PCP in 1-2 weeks. Please obtain BMP/CBC in one week. Advised to follow-up with Cardiology at [facility]. Advised to take amiodarone 400 mg twice daily for 1 week followed by amiodarone 400 mg daily.  Home Health: None Equipment/Devices: None  Discharge Condition: Stable CODE STATUS: Full code Diet recommendation: Heart Healthy   Brief [facility] Course: This 52 yrs old Male with h/o morbid obesity, DM2, HTN, Hyperlipidemia, OSA, CAD and systolic HF (LVEF 20-25%) referred by Dr. Bing Matter for further management of his HF. Patient does not remember what happened. Patient reports he was feeling hot, nauseous and dizzy, his arms felt numb prior to syncope. Patient is following up with heart failure team at [facility], He denies that his AICD did not fire. He took an extra dose of metolazone without any improvement. Patient is admitted for further evaluation. Patient was evaluated by EP physician, AICD interrogation completed. It appears like patient has syncope sec. to V. Fib due to AICD shock. Patient was started on Amiodarone infusion, subsequently switched to oral amiodarone. Cardiology signed off. Advised patient to follow up Cardiology at [facility] . Appointment is scheduled for tomorrow. Patient is being discharged home.  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 Pacemaker interrogation completed. Cardiology evaluation completed.  Patient is loaded with amiodarone.   Keep potassium above 4 magnesium above 2. Cardiology is following.  Continue amiodarone IV infusion x 48 hrs. IV amiodarone switched to amiodarone 400 p.o. twice daily today. Cardiology signed off. Patient is being discharged home.   Chronic systolic CHF (congestive heart failure) (HCC) Volume status looks okay.  BNP 178. Continue GDMT and home diuretics as tolerated. Goal is to DC over the weekend so he can keep up with his [facility] transplant evaluation next week.   Essential hypertension: Resume BP medications.   Obesity: Diet and exercise discussed in detail.   Gout: Chronic stable. Continue home medications. Including allopurinol   Type 2 diabetes mellitus with complication: Hold Ozempic and jardiance. Order SSI   CAD S/P percutaneous coronary angioplasty Denies chest pain or shortness of breath. Continue crestor 40mg  po Q day, aspirin 81 mg po Q day. Continue Ranexa.   Hyperlipidemia: Continue Crestor 40 mg a day.   OSA on CPAP Not compliant with CPAP.   Hypokalemia Replaced.  Continue to monitor.  Discharge Instructions  Discharge Instructions     Call MD for:  difficulty breathing, headache or visual disturbances   Complete by: As directed    Call MD for:  persistant dizziness or light-headedness   Complete by: As directed    Call MD for:  persistant nausea and vomiting   Complete by: As directed    Diet - low sodium heart healthy   Complete  by: As directed    Diet Carb Modified   Complete by: As directed    Discharge instructions   Complete by: As directed    Advised to follow-up with primary care physician in 1 week. Advised to follow-up with Cardiology at [facility]. Advised to  take amiodarone 400 mg twice daily for 7 days followed by 400 mg daily.   Increase activity slowly   Complete by: As directed       Allergies as of 04/01/2023   No Known Allergies      Medication List     STOP taking these medications    isosorbide mononitrate 30 MG 24 hr tablet Commonly known as: IMDUR   metoprolol succinate 25 MG 24 hr tablet Commonly known as: TOPROL-XL   spironolactone 25 MG tablet Commonly known as: ALDACTONE       TAKE these medications    acetaminophen 500 MG tablet Commonly known as: TYLENOL Take 1,000 mg by mouth every 6 (six) hours as needed for moderate pain or headache.   allopurinol 100 MG tablet Commonly known as: ZYLOPRIM Take 100 mg by mouth daily.   amiodarone 400 MG tablet Commonly known as: PACERONE Tablet 1 tablet twice a day for 7 days, then 1 tablet daily thereafter   aspirin EC 81 MG tablet Take 1 tablet (81 mg total) by mouth daily.   BLINK TEARS OP Place 2 drops into both eyes 4 (four) times daily as needed (for dry eyes).   colchicine 0.6 MG tablet Take 0.6 mg by mouth daily as needed (Gout).   diclofenac Sodium 1 % Gel Commonly known as: VOLTAREN APPLY 1 G TOPICALLY DAILY. What changed: See the new instructions.   empagliflozin 10 MG Tabs tablet Commonly known as: Jardiance Take 1 tablet (10 mg total) by mouth daily before breakfast.   escitalopram 10 MG tablet Commonly known as: LEXAPRO Take 10 mg by mouth at bedtime.   gabapentin 100 MG capsule Commonly known as: NEURONTIN Take 100-200 mg by mouth See admin instructions. Take 2 capsules by mouth in the morning and 1 capsule at bedtime or vice versa   ibuprofen 200 MG tablet Commonly known as: ADVIL Take 800 mg by mouth every 8 (eight) hours as needed for headache or moderate pain.   metolazone 2.5 MG tablet Commonly known as: ZAROXOLYN Take 1 tablet (2.5 mg total) by mouth daily. Take one tablet (2.5 mg) once weekly.  Take extra potassium (20 meq)  when  you take metolazone What changed:  when to take this additional instructions   mexiletine 250 MG capsule Commonly known as: MEXITIL Take 1 capsule (250 mg total) by mouth 2 (two) times daily.   nitroGLYCERIN 0.4 MG SL tablet Commonly known as: NITROSTAT Place 1 tablet (0.4 mg total) under the tongue every 5 (five) minutes as needed for chest pain.   omeprazole 40 MG capsule Commonly known as: PRILOSEC Take 1 capsule (40 mg total) by mouth daily.   Ozempic (1 MG/DOSE) 4 MG/3ML Sopn Generic drug: Semaglutide (1 MG/DOSE) Inject 1 mg into the skin every Monday.   potassium chloride SA 20 MEQ tablet Commonly known as: KLOR-CON M Take 2 tablets (40 mEq total) by mouth every morning AND 1 tablet (20 mEq total) every evening. On days you take metolazone take 40 meq of potassium in the morning and 40 in the evening.. What changed: See the new instructions.   ranolazine 500 MG 12 hr tablet Commonly known as: RANEXA Take 1 tablet (500 mg  total) by mouth 2 (two) times daily.   rosuvastatin 40 MG tablet Commonly known as: CRESTOR Take 1 tablet (40 mg total) by mouth daily.   torsemide 20 MG tablet Commonly known as: DEMADEX Take 2 tablets (40 mg total) by mouth every morning AND 2 tablets (40 mg total) every evening.   triamcinolone cream 0.1 % Commonly known as: KENALOG Apply 1 Application topically 2 (two) times daily.   Vicks VapoInhaler Inha Inhale 1 puff into the lungs daily as needed (congestion).        Follow-up Information     [facility] Follow up.   Specialty: Cardiology Why: heart failure office scheduler will contact you to arrange an early follow up Contact information: [address] [place] [address] [phone]        Graciella Freer, PA-C Follow up on 05/07/2023.   Specialty: Cardiology Why: 11:40AM. Cardiology follow up Contact information: [address] [place] [address] [phone]         Georgeanna Lea, MD Follow up on 05/25/2023.   Specialty: Cardiology Why: 9:00AM. Cardiology follow up Contact information: [address] [place] [address] [phone]                No Known Allergies  Consultations: Cardiology   Procedures/Studies: DG Chest Portable 1 View  Result Date: 03/28/2023 CLINICAL DATA:  Chest pain.  Loss of consciousness. EXAM: PORTABLE CHEST 1 VIEW COMPARISON:  Chest CT 02/26/2023. FINDINGS: Mild cardiomegaly. Left-sided pacemaker in place. Vascular congestion without overt edema. No pneumothorax or large pleural effusion. Please note the right lateral costophrenic angle is excluded from the field of view. IMPRESSION: Mild cardiomegaly with vascular congestion. Electronically Signed   By: Narda Rutherford M.D.   On: 03/28/2023 23:27     Subjective: Patient was seen and examined at bed side. Overnight events noted.  He reports feeling much better and wants to be discharged home.   Discharge Exam: Vitals:   04/01/23 0617 04/01/23 0821  BP: 104/74 107/73  Pulse: 90 90  Resp: 20 20  Temp:  97.6 F (36.4 C)  SpO2: 93% 96%   Vitals:   03/31/23 1937 03/31/23 2343 04/01/23 0617 04/01/23 0821  BP: 111/77 105/83 104/74 107/73  Pulse:  (!) 103 90 90  Resp: 20 20 20 20   Temp: 97.9 F (36.6 C) 97.7 F (36.5 C)  97.6 F (36.4 C)  TempSrc: Oral Oral  Oral  SpO2: 97% 97% 93% 96%  Weight:      Height:        General: Pt is alert, awake, not in acute distress Cardiovascular: RRR, S1/S2 +, no rubs, no gallops Respiratory: CTA bilaterally, no wheezing, no rhonchi Abdominal: Soft, NT, ND, bowel sounds + Extremities: no edema, no cyanosis    The results of significant diagnostics from this hospitalization (including imaging, microbiology, ancillary and laboratory) are listed below for reference.     Microbiology: No results found for this or any previous visit (from the past 240  hour(s)).   Labs: BNP (last 3 results) Recent Labs    02/20/23 0930 03/09/23 1243 03/28/23 2205  BNP 321.7* 221.8* 178.3*   Basic Metabolic Panel: Recent Labs  Lab 03/29/23 0024 03/29/23 0248 03/29/23 1846 03/30/23 0554 03/31/23 0439 03/31/23 1634 04/01/23 0340 04/01/23 0956  NA  --  134* 137  --  137 136  --  136  K  --  3.2* 3.5  --  2.7* 3.3*  --  3.4*  CL  --  94* 91*  --  94* 95*  --  94*  CO2  --  29 31  --  29 28  --  29  GLUCOSE  --  139* 146*  --  162* 176*  --  167*  BUN  --  12 15  --  17 18  --  20  CREATININE  --  1.18 1.26*  --  1.25* 1.80*  --  1.32*  CALCIUM  --  8.6* 9.1  --  8.7* 8.4*  --  8.7*  MG 2.1 2.2  --  2.0 1.9  --  2.0  --   PHOS 5.1* 6.0*  --  5.5* 4.3  --  3.3  --    Liver Function Tests: Recent Labs  Lab 03/28/23 2205 03/29/23 0248  AST 27 24  ALT 25 22  ALKPHOS 87 85  BILITOT 1.2 1.1  PROT 7.5 7.2  ALBUMIN 3.5 3.5   No results for input(s): "LIPASE", "AMYLASE" in the last 168 hours. No results for input(s): "AMMONIA" in the last 168 hours. CBC: Recent Labs  Lab 03/28/23 2205 03/29/23 0248 03/30/23 0554 03/31/23 0439 04/01/23 0340  WBC 8.7 8.3 8.2 9.3 8.6  NEUTROABS 6.7  --   --   --   --   HGB 15.3 14.5 15.1 14.4 14.1  HCT 44.2 42.5 43.6 41.0 39.6  MCV 88.8 89.1 87.7 87.0 89.8  PLT 173 186 187 173 161   Cardiac Enzymes: Recent Labs  Lab 03/29/23 0024  CKTOTAL 65   BNP: Invalid input(s): "POCBNP" CBG: Recent Labs  Lab 03/31/23 0631 03/31/23 1058 03/31/23 1604 03/31/23 2115 04/01/23 0615  GLUCAP 152* 259* 154* 204* 163*   D-Dimer No results for input(s): "DDIMER" in the last 72 hours. Hgb A1c No results for input(s): "HGBA1C" in the last 72 hours. Lipid Profile No results for input(s): "CHOL", "HDL", "LDLCALC", "TRIG", "CHOLHDL", "LDLDIRECT" in the last 72 hours. Thyroid function studies No results for input(s): "TSH", "T4TOTAL", "T3FREE", "THYROIDAB" in the last 72 hours.  Invalid input(s):  "FREET3" Anemia work up No results for input(s): "VITAMINB12", "FOLATE", "FERRITIN", "TIBC", "IRON", "RETICCTPCT" in the last 72 hours. Urinalysis    Component Value Date/Time   COLORURINE YELLOW 04/10/2022 0225   APPEARANCEUR CLEAR 04/10/2022 0225   LABSPEC 1.025 04/10/2022 0225   PHURINE 6.0 04/10/2022 0225   GLUCOSEU >=500 (A) 04/10/2022 0225   HGBUR NEGATIVE 04/10/2022 0225   BILIRUBINUR NEGATIVE 04/10/2022 0225   KETONESUR NEGATIVE 04/10/2022 0225   PROTEINUR NEGATIVE 04/10/2022 0225   NITRITE NEGATIVE 04/10/2022 0225   LEUKOCYTESUR NEGATIVE 04/10/2022 0225   Sepsis Labs Recent Labs  Lab 03/29/23 0248 03/30/23 0554 03/31/23 0439 04/01/23 0340  WBC 8.3 8.2 9.3 8.6   Microbiology No results found for this or any previous visit (from the past 240 hour(s)).   Time coordinating discharge: Over 30 minutes  SIGNED:   Willeen Niece, MD  [facility] 04/01/2023, 2:17 PM Pager   If 7PM-7AM, please contact night-coverage

## 2023-04-01 NOTE — Progress Notes (Signed)
Patient given discharge instructions. Wife present. PIVs removed. Telemetry box removed, CCMD notified. Patient taken to vehicle in wheelchair by staff.  Kenard Gower, RN

## 2023-04-01 NOTE — Discharge Instructions (Signed)
Advised to follow-up with primary care physician in 1 week. Advised to follow-up with cardiology at [facility]. Advised to take amiodarone 400 mg twice daily for 1 week followed by amiodarone 400 mg daily

## 2023-04-01 NOTE — Progress Notes (Signed)
   Rounding Note    Patient Name: Joe [name] Date of Encounter: 04/01/2023  [facility] Cardiologist: [name], MD   Subjective   NAEO.   Vital Signs    Vitals:   03/31/23 1937 03/31/23 2343 04/01/23 0617 04/01/23 0821  BP: 111/77 105/83 104/74 107/73  Pulse:  (!) 103 90 90  Resp: 20 20 20 20   Temp: 97.9 F (36.6 C) 97.7 F (36.5 C)  97.6 F (36.4 C)  TempSrc: Oral Oral  Oral  SpO2: 97% 97% 93% 96%  Weight:      Height:        Intake/Output Summary (Last 24 hours) at 04/01/2023 0851 Last data filed at 03/31/2023 2343 Gross per 24 hour  Intake 360 ml  Output 1550 ml  Net -1190 ml      03/30/2023    1:16 PM 03/09/2023   12:15 PM 02/23/2023    2:36 PM  Last 3 Weights  Weight (lbs) 307 lb 12.8 oz 310 lb 306 lb  Weight (kg) 139.617 kg 140.615 kg 138.801 kg      Telemetry    No sustained arrhythmias - Personally Reviewed  ECG     - Personally Reviewed  Physical Exam    GEN: No acute distress.   Cardiac: RRR, no murmurs, rubs, or gallops.  Respiratory: Clear to auscultation bilaterally. Psych: Normal affect   Assessment & Plan    #VF #ICD Cont amio 400mg  PO BID x 7 days, then decrease to 400mg  PO daily  #Chronic systolic HF Improved volume status [facility] transplant eval planned  #CAD Recent cath with stable disease. No ischemic symptoms  OK to discharge.    Sheria Lang T. Lalla Brothers, MD, [facility], [facility] Cardiac Electrophysiology

## 2023-04-02 ENCOUNTER — Inpatient Hospital Stay (HOSPITAL_COMMUNITY)
Admission: RE | Admit: 2023-04-02 | Discharge: 2023-04-02 | Disposition: A | Discharge status: Home / Self Care | Payer: 59 | Source: Ambulatory Visit

## 2023-04-02 DIAGNOSIS — I469 Cardiac arrest, cause unspecified: Secondary | ICD-10-CM

## 2023-04-02 DIAGNOSIS — I4901 Ventricular fibrillation: Secondary | ICD-10-CM

## 2023-04-03 ENCOUNTER — Encounter: Payer: Self-pay | Admitting: Internal Medicine

## 2023-04-08 ENCOUNTER — Other Ambulatory Visit: Payer: Self-pay

## 2023-04-08 ENCOUNTER — Emergency Department (HOSPITAL_COMMUNITY): Payer: 59

## 2023-04-08 ENCOUNTER — Encounter (HOSPITAL_COMMUNITY): Payer: Self-pay

## 2023-04-08 ENCOUNTER — Inpatient Hospital Stay (HOSPITAL_COMMUNITY)
Admission: EM | Admit: 2023-04-08 | Discharge: 2023-04-12 | DRG: 291 | Disposition: A | Discharge status: Home / Self Care | Payer: 59 | Attending: Cardiology | Admitting: Cardiology

## 2023-04-08 DIAGNOSIS — Z7982 Long term (current) use of aspirin: Secondary | ICD-10-CM

## 2023-04-08 DIAGNOSIS — G4733 Obstructive sleep apnea (adult) (pediatric): Secondary | ICD-10-CM | POA: Diagnosis present

## 2023-04-08 DIAGNOSIS — Z808 Family history of malignant neoplasm of other organs or systems: Secondary | ICD-10-CM

## 2023-04-08 DIAGNOSIS — I5020 Unspecified systolic (congestive) heart failure: Secondary | ICD-10-CM | POA: Diagnosis present

## 2023-04-08 DIAGNOSIS — E782 Mixed hyperlipidemia: Secondary | ICD-10-CM

## 2023-04-08 DIAGNOSIS — E785 Hyperlipidemia, unspecified: Secondary | ICD-10-CM | POA: Diagnosis present

## 2023-04-08 DIAGNOSIS — I472 Ventricular tachycardia, unspecified: Secondary | ICD-10-CM | POA: Diagnosis present

## 2023-04-08 DIAGNOSIS — Z9989 Dependence on other enabling machines and devices: Secondary | ICD-10-CM

## 2023-04-08 DIAGNOSIS — E7800 Pure hypercholesterolemia, unspecified: Secondary | ICD-10-CM | POA: Diagnosis present

## 2023-04-08 DIAGNOSIS — I5022 Chronic systolic (congestive) heart failure: Secondary | ICD-10-CM | POA: Diagnosis present

## 2023-04-08 DIAGNOSIS — I5023 Acute on chronic systolic (congestive) heart failure: Secondary | ICD-10-CM | POA: Diagnosis present

## 2023-04-08 DIAGNOSIS — Z4502 Encounter for adjustment and management of automatic implantable cardiac defibrillator: Secondary | ICD-10-CM

## 2023-04-08 DIAGNOSIS — I5082 Biventricular heart failure: Secondary | ICD-10-CM | POA: Diagnosis present

## 2023-04-08 DIAGNOSIS — I110 Hypertensive heart disease with heart failure: Principal | ICD-10-CM | POA: Diagnosis present

## 2023-04-08 DIAGNOSIS — Z7985 Long-term (current) use of injectable non-insulin antidiabetic drugs: Secondary | ICD-10-CM

## 2023-04-08 DIAGNOSIS — M109 Gout, unspecified: Secondary | ICD-10-CM | POA: Diagnosis present

## 2023-04-08 DIAGNOSIS — T502X5A Adverse effect of carbonic-anhydrase inhibitors, benzothiadiazides and other diuretics, initial encounter: Secondary | ICD-10-CM | POA: Diagnosis not present

## 2023-04-08 DIAGNOSIS — K219 Gastro-esophageal reflux disease without esophagitis: Secondary | ICD-10-CM | POA: Diagnosis present

## 2023-04-08 DIAGNOSIS — K5900 Constipation, unspecified: Secondary | ICD-10-CM | POA: Diagnosis not present

## 2023-04-08 DIAGNOSIS — Z79899 Other long term (current) drug therapy: Secondary | ICD-10-CM

## 2023-04-08 DIAGNOSIS — Z8249 Family history of ischemic heart disease and other diseases of the circulatory system: Secondary | ICD-10-CM

## 2023-04-08 DIAGNOSIS — I25810 Atherosclerosis of coronary artery bypass graft(s) without angina pectoris: Secondary | ICD-10-CM

## 2023-04-08 DIAGNOSIS — I462 Cardiac arrest due to underlying cardiac condition: Secondary | ICD-10-CM | POA: Diagnosis present

## 2023-04-08 DIAGNOSIS — E038 Other specified hypothyroidism: Secondary | ICD-10-CM | POA: Diagnosis present

## 2023-04-08 DIAGNOSIS — N179 Acute kidney failure, unspecified: Secondary | ICD-10-CM | POA: Diagnosis present

## 2023-04-08 DIAGNOSIS — Z95810 Presence of automatic (implantable) cardiac defibrillator: Secondary | ICD-10-CM

## 2023-04-08 DIAGNOSIS — I25118 Atherosclerotic heart disease of native coronary artery with other forms of angina pectoris: Secondary | ICD-10-CM | POA: Diagnosis present

## 2023-04-08 DIAGNOSIS — I5084 End stage heart failure: Secondary | ICD-10-CM | POA: Diagnosis present

## 2023-04-08 DIAGNOSIS — Z6838 Body mass index (BMI) 38.0-38.9, adult: Secondary | ICD-10-CM | POA: Diagnosis not present

## 2023-04-08 DIAGNOSIS — E876 Hypokalemia: Secondary | ICD-10-CM | POA: Diagnosis present

## 2023-04-08 DIAGNOSIS — Z833 Family history of diabetes mellitus: Secondary | ICD-10-CM

## 2023-04-08 DIAGNOSIS — I4901 Ventricular fibrillation: Principal | ICD-10-CM | POA: Diagnosis present

## 2023-04-08 DIAGNOSIS — Z7984 Long term (current) use of oral hypoglycemic drugs: Secondary | ICD-10-CM

## 2023-04-08 DIAGNOSIS — Z955 Presence of coronary angioplasty implant and graft: Secondary | ICD-10-CM

## 2023-04-08 DIAGNOSIS — I493 Ventricular premature depolarization: Secondary | ICD-10-CM | POA: Diagnosis present

## 2023-04-08 DIAGNOSIS — Z801 Family history of malignant neoplasm of trachea, bronchus and lung: Secondary | ICD-10-CM

## 2023-04-08 DIAGNOSIS — I469 Cardiac arrest, cause unspecified: Secondary | ICD-10-CM | POA: Diagnosis present

## 2023-04-08 DIAGNOSIS — E119 Type 2 diabetes mellitus without complications: Secondary | ICD-10-CM | POA: Diagnosis present

## 2023-04-08 DIAGNOSIS — Z85828 Personal history of other malignant neoplasm of skin: Secondary | ICD-10-CM

## 2023-04-08 DIAGNOSIS — I255 Ischemic cardiomyopathy: Secondary | ICD-10-CM | POA: Diagnosis present

## 2023-04-08 DIAGNOSIS — Z807 Family history of other malignant neoplasms of lymphoid, hematopoietic and related tissues: Secondary | ICD-10-CM

## 2023-04-08 LAB — COMPREHENSIVE METABOLIC PANEL
ALT: 29 U/L (ref 0–44)
AST: 28 U/L (ref 15–41)
Albumin: 3.3 g/dL — ABNORMAL LOW (ref 3.5–5.0)
Alkaline Phosphatase: 128 U/L — ABNORMAL HIGH (ref 38–126)
Anion gap: 16 — ABNORMAL HIGH (ref 5–15)
BUN: 19 mg/dL (ref 6–20)
CO2: 30 mmol/L (ref 22–32)
Calcium: 8.9 mg/dL (ref 8.9–10.3)
Chloride: 90 mmol/L — ABNORMAL LOW (ref 98–111)
Creatinine, Ser: 1.83 mg/dL — ABNORMAL HIGH (ref 0.61–1.24)
GFR, Estimated: 44 mL/min — ABNORMAL LOW (ref >60)
Glucose, Bld: 142 mg/dL — ABNORMAL HIGH (ref 70–99)
Potassium: 2.5 mmol/L — CL (ref 3.5–5.1)
Sodium: 136 mmol/L (ref 135–145)
Total Bilirubin: 1.2 mg/dL (ref 0.3–1.2)
Total Protein: 7.1 g/dL (ref 6.5–8.1)

## 2023-04-08 LAB — CBC WITH DIFFERENTIAL/PLATELET
Abs Immature Granulocytes: 0.05 10*3/uL (ref 0.00–0.07)
Basophils Absolute: 0.1 10*3/uL (ref 0.0–0.1)
Basophils Relative: 1 %
Eosinophils Absolute: 0.3 10*3/uL (ref 0.0–0.5)
Eosinophils Relative: 4 %
HCT: 39.7 % (ref 39.0–52.0)
Hemoglobin: 14 g/dL (ref 13.0–17.0)
Immature Granulocytes: 1 %
Lymphocytes Relative: 12 %
Lymphs Abs: 1 10*3/uL (ref 0.7–4.0)
MCH: 31.4 pg (ref 26.0–34.0)
MCHC: 35.3 g/dL (ref 30.0–36.0)
MCV: 89 fL (ref 80.0–100.0)
Monocytes Absolute: 0.9 10*3/uL (ref 0.1–1.0)
Monocytes Relative: 12 %
Neutro Abs: 5.5 10*3/uL (ref 1.7–7.7)
Neutrophils Relative %: 70 %
Platelets: 229 10*3/uL (ref 150–400)
RBC: 4.46 MIL/uL (ref 4.22–5.81)
RDW: 14 % (ref 11.5–15.5)
WBC: 7.8 10*3/uL (ref 4.0–10.5)
nRBC: 0 % (ref 0.0–0.2)

## 2023-04-08 LAB — MAGNESIUM: Magnesium: 2.6 mg/dL — ABNORMAL HIGH (ref 1.7–2.4)

## 2023-04-08 LAB — GLUCOSE, CAPILLARY: Glucose-Capillary: 187 mg/dL — ABNORMAL HIGH (ref 70–99)

## 2023-04-08 LAB — BRAIN NATRIURETIC PEPTIDE: B Natriuretic Peptide: 182.4 pg/mL — ABNORMAL HIGH (ref 0.0–100.0)

## 2023-04-08 MED ORDER — POTASSIUM CHLORIDE 10 MEQ/100ML IV SOLN
10.0000 meq | INTRAVENOUS | Status: AC
Start: 2023-04-08 — End: 2023-04-08
  Administered 2023-04-08 (×3): 10 meq via INTRAVENOUS
  Filled 2023-04-08 (×3): qty 100

## 2023-04-08 MED ORDER — ASPIRIN 81 MG PO TBEC
81.0000 mg | DELAYED_RELEASE_TABLET | Freq: Every day | ORAL | Status: DC
  Administered 2023-04-09 – 2023-04-12 (×4): 81 mg via ORAL
  Filled 2023-04-08 (×4): qty 1

## 2023-04-08 MED ORDER — MELATONIN 3 MG PO TABS
3.0000 mg | ORAL_TABLET | Freq: Every evening | ORAL | Status: DC | PRN
  Filled 2023-04-08: qty 1

## 2023-04-08 MED ORDER — ACETAMINOPHEN 650 MG RE SUPP: 650.0000 mg | Freq: Four times a day (QID) | RECTAL | Status: DC | PRN

## 2023-04-08 MED ORDER — ROSUVASTATIN CALCIUM 20 MG PO TABS
40.0000 mg | ORAL_TABLET | Freq: Every day | ORAL | Status: DC
  Administered 2023-04-09 – 2023-04-11 (×3): 40 mg via ORAL
  Filled 2023-04-08 (×4): qty 2

## 2023-04-08 MED ORDER — PANTOPRAZOLE SODIUM 40 MG PO TBEC
40.0000 mg | DELAYED_RELEASE_TABLET | Freq: Every day | ORAL | Status: DC
  Administered 2023-04-09 – 2023-04-12 (×4): 40 mg via ORAL
  Filled 2023-04-08 (×4): qty 1

## 2023-04-08 MED ORDER — AMIODARONE HCL 200 MG PO TABS
400.0000 mg | ORAL_TABLET | Freq: Every day | ORAL | Status: DC
  Administered 2023-04-09 – 2023-04-10 (×2): 400 mg via ORAL
  Filled 2023-04-08 (×2): qty 2

## 2023-04-08 MED ORDER — RANOLAZINE ER 500 MG PO TB12
500.0000 mg | ORAL_TABLET | Freq: Two times a day (BID) | ORAL | Status: DC
  Administered 2023-04-08 – 2023-04-12 (×8): 500 mg via ORAL
  Filled 2023-04-08 (×8): qty 1

## 2023-04-08 MED ORDER — EMPAGLIFLOZIN 10 MG PO TABS
10.0000 mg | ORAL_TABLET | Freq: Every day | ORAL | Status: DC
  Administered 2023-04-09 – 2023-04-12 (×4): 10 mg via ORAL
  Filled 2023-04-08 (×4): qty 1

## 2023-04-08 MED ORDER — TORSEMIDE 20 MG PO TABS
80.0000 mg | ORAL_TABLET | Freq: Every morning | ORAL | Status: DC
  Administered 2023-04-09: 80 mg via ORAL
  Filled 2023-04-08: qty 4

## 2023-04-08 MED ORDER — MEXILETINE HCL 250 MG PO CAPS
250.0000 mg | ORAL_CAPSULE | Freq: Two times a day (BID) | ORAL | Status: DC
  Administered 2023-04-08 – 2023-04-11 (×7): 250 mg via ORAL
  Filled 2023-04-08 (×9): qty 1

## 2023-04-08 MED ORDER — POTASSIUM CHLORIDE 20 MEQ PO PACK
40.0000 meq | PACK | Freq: Two times a day (BID) | ORAL | Status: DC
  Administered 2023-04-08 – 2023-04-09 (×2): 40 meq via ORAL
  Filled 2023-04-08 (×2): qty 2

## 2023-04-08 MED ORDER — GABAPENTIN 100 MG PO CAPS: 100.0000 mg | ORAL_CAPSULE | Freq: Two times a day (BID) | ORAL | Status: DC

## 2023-04-08 MED ORDER — TORSEMIDE 20 MG PO TABS
40.0000 mg | ORAL_TABLET | Freq: Every day | ORAL | Status: DC
  Administered 2023-04-08: 40 mg via ORAL
  Filled 2023-04-08: qty 2

## 2023-04-08 MED ORDER — INSULIN ASPART 100 UNIT/ML IJ SOLN
0.0000 [IU] | Freq: Three times a day (TID) | INTRAMUSCULAR | Status: DC
  Administered 2023-04-09 (×3): 2 [IU] via SUBCUTANEOUS
  Administered 2023-04-10 (×2): 1 [IU] via SUBCUTANEOUS
  Administered 2023-04-10: 2 [IU] via SUBCUTANEOUS
  Administered 2023-04-11: 1 [IU] via SUBCUTANEOUS
  Administered 2023-04-11 – 2023-04-12 (×2): 2 [IU] via SUBCUTANEOUS

## 2023-04-08 MED ORDER — ACETAMINOPHEN 325 MG PO TABS: 650.0000 mg | ORAL_TABLET | Freq: Four times a day (QID) | ORAL | Status: DC | PRN

## 2023-04-08 NOTE — H&P (Signed)
History and Physical      Joe [name] XBJ:[id number] DOB: 10-Jan-1971 DOA: 04/08/2023; DOS: 04/08/2023  PCP: Joe Peak, PA-C  Patient coming from: home   I have personally briefly reviewed patient's old medical records in [facility]  Chief Complaint: Cardiac arrest  HPI: Joe [name] is a 52 y.o. male with medical history significant for chronic systolic heart failure in setting of ischemic cardiomyopathy, V-fib arrest, hyperlipidemia, obstructive sleep apnea on home nocturnal CPAP, type 2 diabetes mellitus, who is admitted to [facility] on 04/08/2023 with V-fib arrest status post ROSC after presenting from home to [facility] ED complaining of cardiac arrest.   In this patient with history of chronic systolic heart failure the setting of ischemic cardiomyopathy, status post [facility] Jude ICD placed in September 2020, it was noted that he was hospitalized last week for V-fib arrest.  He presents again this evening after cardiac arrest at home.  He was at home with family when he lost consciousness, which the family found the patient to be pulseless and initiated CPR.  ROSC was subsequently achieved after 2 minutes and ensuing interrogation of the patient's ICD revealed a V-fib arrest prompting 2 shocks from the ICD.  The patient conveys that he was asymptomatic leading up to this V-fib arrest, denies any recent chest pain or shortness of breath.  No recent palpitations, diaphoresis, nausea, vomiting, dizziness.  In the setting of his V-fib arrest last week, he was started on oral amiodarone, completing his 7-day loading dose of such yesterday.  This is in addition to being on mexiletine.  He is reported to not be a candidate for LVAD, but is being followed at [facility] for potential cardiac transplant.  Of note, most recent echocardiogram occurred on 02/15/2023 and was notable for LVEF less than 20%, global left ventricular hypokinesis, severely dilated left ventricular  internal cavity size, indeterminate diastolic Normal Right Ventricular Systolic Function, Mildly Dilated Right Atrium, Mild Mitral Gravitation.  Subsequently, He Underwent Left/Right Heart Cath on 02/23/2023.  Over the last 1 to 2 weeks, he notes a 14 pound weight gain, but denies any recent worsening of his shortness of breath nor any recent orthopnea or worsening of peripheral edema.  He reports good compliance with his outpatient regimen includes torsemide as well as [place] zone, the latter of which she takes q. weekly, with dose occurring earlier today.  In the setting of these diuretic medications, he notes that his outpatient potassium supplementation regimen is potassium chloride 40 mill equivalents p.o. twice daily, upon which he reports also to compliance.     ED Course:  Vital signs in the ED were notable for the following: Afebrile; rates in the 60s to 70s; systolic pressures in the 90s 1 teens; respiratory rate 14-18, oxygen saturation 96% on room air.  Labs were notable for the following: CMP notable for the following: Sodium 126 and potassium 2.5, bicarbonate 30, creatinine 1.83 compared to 1.32 on 04/01/2023, glucose 142, the reason that is within normal limits.  Serum magnesium level 2.6.  BNP 182 compared to prior value 178 on 03/28/2023.  CBC notable for white cell count 70 undergo hemoglobin 14.  Per my interpretation, EKG in ED demonstrated the following: Atrial sensed, ventricular paced rhythm, with heart rate 76, without overt evidence of T wave or ST changes.  Imaging in the ED, per corresponding formal radiology read, was notable for the following: 1 view chest x-ray showed evidence of acute cardiopulmonary process, Cleen evidence of endotracheal edema,  effusion, or pneumothorax.  EDP d/w on-call [facility] cardiology, who recommended TRH admission and conveyed that cardiology will formally consult. At this time, cardiology feels that the patient is relatively euvolemic, but will  further evaluate to determine if escalation of his outpatient diuretic regimen is warranted.  While in the ED, the following were administered: Potassium chloride 40 mill equivalents p.o. x 1 dose as well as potassium chloride 30 mEq IV over 3 hours.  Subsequently, the patient was admitted for further evaluation management of presenting V-fib arrest, with presenting labs notable for hypokalemia as well as acute kidney injury.     Review of Systems: As per HPI otherwise 10 point review of systems negative.   Past Medical History:  Diagnosis Date   Abnormal EKG    Acute systolic heart failure (HCC) 09/06/2017   Angina pectoris (HCC) 09/05/2017   Body mass index 45.0-49.9, adult (HCC)    CAD S/P percutaneous coronary angioplasty 09/07/2017   mLAD and dLAD PCI with DES 09/06/17   CHF (congestive heart failure) (HCC)    Chronic systolic (congestive) heart failure (HCC) 03/19/2019   Complication of anesthesia 1995   "had hard time waking up; breathing w/vasectomy"   Coronary artery disease    Erectile dysfunction    Essential hypertension, benign    Fatigue    GERD (gastroesophageal reflux disease)    Gout    High cholesterol    "just started tx yesterday" (09/06/2017)   History of gout    "RX prn" (09/06/2017)   Ischemic cardiomyopathy 09/07/2017   EF 25-35%   Muscular chest pain    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   Below Left Nare (MOH's)   Obesity    Obstructive sleep apnea    OSA on CPAP    Plantar fasciitis    Pre-operative clearance 02/11/2018   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/02/2019   Saint Jude   Right bundle branch block    Shortness of breath 09/05/2017   Testosterone deficiency    Type 2 diabetes mellitus with complication, without long-term current use of insulin (HCC) 09/05/2017   Type II diabetes mellitus (HCC)    "started tx 08/28/2017"    Past Surgical History:  Procedure Laterality Date   BACK SURGERY     BIV  ICD INSERTION CRT-D N/A 03/19/2019   Procedure: BIV ICD INSERTION CRT-D;  Surgeon: Regan Lemming, MD;  Location: [facility] INVASIVE CV LAB;  Service: Cardiovascular;  Laterality: N/A;   CORONARY ANGIOPLASTY WITH STENT PLACEMENT  09/06/2017   "3 stents"   CORONARY STENT INTERVENTION N/A 09/06/2017   Procedure: CORONARY STENT INTERVENTION;  Surgeon: Corky Crafts, MD;  Location: [facility] INVASIVE CV LAB;  Service: Cardiovascular;  Laterality: N/A;   CORONARY STENT INTERVENTION N/A 02/15/2018   Procedure: CORONARY STENT INTERVENTION;  Surgeon: Corky Crafts, MD;  Location: [facility] INVASIVE CV LAB;  Service: Cardiovascular;  Laterality: N/A;   KNEE ARTHROSCOPY Right    LEFT HEART CATH AND CORONARY ANGIOGRAPHY N/A 09/06/2017   Procedure: LEFT HEART CATH AND CORONARY ANGIOGRAPHY;  Surgeon: Corky Crafts, MD;  Location: [facility] INVASIVE CV LAB;  Service: Cardiovascular;  Laterality: N/A;   LEFT HEART CATH AND CORONARY ANGIOGRAPHY N/A 02/15/2018   Procedure: LEFT HEART CATH AND CORONARY ANGIOGRAPHY;  Surgeon: Corky Crafts, MD;  Location: [facility] INVASIVE CV LAB;  Service: Cardiovascular;  Laterality: N/A;   LEFT HEART CATH AND CORONARY ANGIOGRAPHY N/A 06/27/2018   Procedure: LEFT HEART CATH AND CORONARY ANGIOGRAPHY;  Surgeon: Corky Crafts, MD;  Location: [facility] INVASIVE CV LAB;  Service: Cardiovascular;  Laterality: N/A;   LEFT HEART CATH AND CORONARY ANGIOGRAPHY N/A 06/28/2020   Procedure: LEFT HEART CATH AND CORONARY ANGIOGRAPHY;  Surgeon: Swaziland, Peter M, MD;  Location: [facility] INVASIVE CV LAB;  Service: Cardiovascular;  Laterality: N/A;   LUMBAR SPINE SURGERY  ~ 2001   Intradiscal Electrothermoplasty   RIGHT/LEFT HEART CATH AND CORONARY ANGIOGRAPHY N/A 03/07/2021   Procedure: RIGHT/LEFT HEART CATH AND CORONARY ANGIOGRAPHY;  Surgeon: Laurey Morale, MD;  Location: [facility] INVASIVE CV LAB;  Service: Cardiovascular;  Laterality: N/A;   RIGHT/LEFT HEART CATH AND CORONARY ANGIOGRAPHY N/A 02/23/2023    Procedure: RIGHT/LEFT HEART CATH AND CORONARY ANGIOGRAPHY;  Surgeon: Dolores Patty, MD;  Location: [facility] INVASIVE CV LAB;  Service: Cardiovascular;  Laterality: N/A;   VASECTOMY  1995    Social History:  reports that he has never smoked. He has never used smokeless tobacco. He reports that he does not drink alcohol and does not use drugs.   No Known Allergies  Family History  Problem Relation Age of Onset   Lymphoma Father    Diabetes Mellitus II Brother    Diabetes Mellitus II Paternal Grandfather    Lung cancer Mother    Brain cancer Mother     Family history reviewed and not pertinent    Prior to Admission medications   Medication Sig Start Date End Date Taking? Authorizing Provider  acetaminophen (TYLENOL) 500 MG tablet Take 1,000 mg by mouth every 6 (six) hours as needed for moderate pain or headache.    [provider]  allopurinol (ZYLOPRIM) 100 MG tablet Take 100 mg by mouth daily.    [provider]  amiodarone (PACERONE) 400 MG tablet Tablet 1 tablet twice a day for 7 days, then 1 tablet daily thereafter 04/01/23   Azalee Course, PA  Aromatic Inhalants (VICKS VAPOINHALER) INHA Inhale 1 puff into the lungs daily as needed (congestion).    [provider]  aspirin EC 81 MG tablet Take 1 tablet (81 mg total) by mouth daily. 09/05/17   Georgeanna Lea, MD  colchicine 0.6 MG tablet Take 0.6 mg by mouth daily as needed (Gout). 05/10/20   [provider]  diclofenac Sodium (VOLTAREN) 1 % GEL APPLY 1 G TOPICALLY DAILY. Patient taking differently: Apply 2 g topically daily as needed (for pain). 01/11/23   Standiford, Jenelle Mages, DPM  empagliflozin (JARDIANCE) 10 MG TABS tablet Take 1 tablet (10 mg total) by mouth daily before breakfast. 02/23/23   Andrey Farmer, PA-C  escitalopram (LEXAPRO) 10 MG tablet Take 10 mg by mouth at bedtime. 06/24/21   [provider]  gabapentin (NEURONTIN) 100 MG capsule Take 100-200 mg by mouth See  admin instructions. Take 2 capsules by mouth in the morning and 1 capsule at bedtime or vice versa 11/09/21   [provider]  ibuprofen (ADVIL) 200 MG tablet Take 800 mg by mouth every 8 (eight) hours as needed for headache or moderate pain.    [provider]  metolazone (ZAROXOLYN) 2.5 MG tablet Take 1 tablet (2.5 mg total) by mouth daily. Take one tablet (2.5 mg) once weekly.  Take extra potassium (20 meq) when  you take metolazone Patient taking differently: Take 2.5 mg by mouth See admin instructions. Take one tablet (2.5 mg) once weekly on Saturday.  May take 1 tablet during the week for weight gain. Take extra potassium (20 meq) when  you take metolazone  02/23/23   Bensimhon, Bevelyn Buckles, MD  mexiletine (MEXITIL) 250 MG capsule Take 1 capsule (250 mg total) by mouth 2 (two) times daily. 12/18/22   Camnitz, Andree Coss, MD  nitroGLYCERIN (NITROSTAT) 0.4 MG SL tablet Place 1 tablet (0.4 mg total) under the tongue every 5 (five) minutes as needed for chest pain. 03/16/23   Milford, Anderson Malta, FNP  omeprazole (PRILOSEC) 40 MG capsule Take 1 capsule (40 mg total) by mouth daily. 01/14/19   Georgeanna Lea, MD  OZEMPIC, 1 MG/DOSE, 4 MG/3ML SOPN Inject 1 mg into the skin every Monday. 12/28/22   [provider]  Polyethylene Glycol 400 (BLINK TEARS OP) Place 2 drops into both eyes 4 (four) times daily as needed (for dry eyes).    [provider]  potassium chloride SA (KLOR-CON M) 20 MEQ tablet Take 2 tablets (40 mEq total) by mouth every morning AND 1 tablet (20 mEq total) every evening. On days you take metolazone take 40 meq of potassium in the morning and 40 in the evening.. Patient taking differently: Take 2 tablets (40 mEq total) by mouth every morning AND 2 tablet (40 mEq total) every evening. On days you take metolazone take 40 meq of potassium in the morning and 40 in the evening.. 02/23/23   Bensimhon, Bevelyn Buckles, MD  ranolazine (RANEXA) 500 MG 12 hr tablet Take 1  tablet (500 mg total) by mouth 2 (two) times daily. 07/18/22   Georgeanna Lea, MD  rosuvastatin (CRESTOR) 40 MG tablet Take 1 tablet (40 mg total) by mouth daily. 07/18/22   Georgeanna Lea, MD  torsemide (DEMADEX) 20 MG tablet Take 2 tablets (40 mg total) by mouth every morning AND 2 tablets (40 mg total) every evening. 02/23/23 05/24/23  Bensimhon, Bevelyn Buckles, MD  triamcinolone cream (KENALOG) 0.1 % Apply 1 Application topically 2 (two) times daily. 10/09/22   [provider]     Objective    Physical Exam: Vitals:   04/08/23 1813 04/08/23 1830 04/08/23 1900 04/08/23 1930  BP:  116/71 107/76 94/61  Pulse: 73 71 72 (!) 35  Resp: 13 10 14 18   Temp:      TempSrc:      SpO2: 99% 98% 98% 100%  Weight:      Height:        General: appears to be stated age; alert, oriented Skin: warm, dry, no rash Head:  AT/[place] Mouth:  Oral mucosa membranes appear moist, normal dentition Neck: supple; trachea midline Heart:  RRR; did not appreciate any M/R/G Lungs: CTAB, did not appreciate any wheezes, rales, or rhonchi Abdomen: + BS; soft, ND, NT Vascular: 2+ pedal pulses b/l; 2+ radial pulses b/l Extremities: no peripheral edema, no muscle wasting Neuro: strength and sensation intact in upper and lower extremities b/l     Labs on Admission: I have personally reviewed following labs and imaging studies  CBC: Recent Labs  Lab 04/08/23 1819  WBC 7.8  NEUTROABS 5.5  HGB 14.0  HCT 39.7  MCV 89.0  PLT 229   Basic Metabolic Panel: Recent Labs  Lab 04/08/23 1819  NA 136  K 2.5*  CL 90*  CO2 30  GLUCOSE 142*  BUN 19  CREATININE 1.83*  CALCIUM 8.9  MG 2.6*   GFR: Estimated Creatinine Clearance: 72.1 mL/min (A) (by C-G formula based on SCr of 1.83 mg/dL (H)). Liver Function Tests: Recent Labs  Lab 04/08/23 1819  AST 28  ALT 29  ALKPHOS 128*  BILITOT 1.2  PROT 7.1  ALBUMIN 3.3*   No results for input(s): "LIPASE", "AMYLASE" in the last 168 hours. No results for  input(s): "AMMONIA" in the last 168 hours. Coagulation Profile: No results for input(s): "INR", "PROTIME" in the last 168 hours. Cardiac Enzymes: No results for input(s): "CKTOTAL", "CKMB", "CKMBINDEX", "TROPONINI" in the last 168 hours. BNP (last 3 results) Recent Labs    05/10/22 0905 08/10/22 1534  PROBNP 475* 351*   HbA1C: No results for input(s): "HGBA1C" in the last 72 hours. CBG: No results for input(s): "GLUCAP" in the last 168 hours. Lipid Profile: No results for input(s): "CHOL", "HDL", "LDLCALC", "TRIG", "CHOLHDL", "LDLDIRECT" in the last 72 hours. Thyroid Function Tests: No results for input(s): "TSH", "T4TOTAL", "FREET4", "T3FREE", "THYROIDAB" in the last 72 hours. Anemia Panel: No results for input(s): "VITAMINB12", "FOLATE", "FERRITIN", "TIBC", "IRON", "RETICCTPCT" in the last 72 hours. Urine analysis:    Component Value Date/Time   COLORURINE YELLOW 04/10/2022 0225   APPEARANCEUR CLEAR 04/10/2022 0225   LABSPEC 1.025 04/10/2022 0225   PHURINE 6.0 04/10/2022 0225   GLUCOSEU >=500 (A) 04/10/2022 0225   HGBUR NEGATIVE 04/10/2022 0225   BILIRUBINUR NEGATIVE 04/10/2022 0225   KETONESUR NEGATIVE 04/10/2022 0225   PROTEINUR NEGATIVE 04/10/2022 0225   NITRITE NEGATIVE 04/10/2022 0225   LEUKOCYTESUR NEGATIVE 04/10/2022 0225    Radiological Exams on Admission: DG Chest Port 1 View  Result Date: 04/08/2023 CLINICAL DATA:  syncope EXAM: PORTABLE CHEST 1 VIEW COMPARISON:  Chest x-ray 03/28/2023 FINDINGS: Enlarged cardiac silhouette. The heart and mediastinal contours are unchanged. Left chest wall triple lead pacemaker defibrillator. No focal consolidation. No pulmonary edema. No pleural effusion. No pneumothorax. No acute osseous abnormality. IMPRESSION: No active disease. Electronically Signed   By: Tish Frederickson M.D.   On: 04/08/2023 20:10      Assessment/Plan   Principal Problem:   Ventricular fibrillation (HCC) Active Problems:   Obstructive sleep apnea    GERD (gastroesophageal reflux disease)   Chronic systolic heart failure (HCC)   Hyperlipidemia   DM2 (diabetes mellitus, type 2) (HCC)   Hypokalemia   Cardiac arrest (HCC)   HFrEF (heart failure with reduced ejection fraction) (HCC)   AKI (acute kidney injury) (HCC)     #) V fib arrest: At home today, patient with episode of loss of consciousness, found to be pulseless, probably 2 minutes of CPR following which ROSC was achieved, with ensuing interrogation of ICD revealing V-fib arrest prompting 2 shocks from his ICD.  Not associate with any recent chest pain.  Post ROSC EKG shows no evidence of acute ischemic changes.  It appears that he had very similar presentation last week at which time he was also diagnosed with V-fib arrest, prompting interval initiation of oral amiodarone, status post completion of the 1 week course of loading dosing thereof.  This is in addition to mexiletine as an outpatient.  The patient is awake alert.  Consequently, there does not appear to be an indication for therapeutic cooling therapy initiation at this time. appears hemodynamically stable, and is completely asymptomatic at this time.   At risk for sudden cardiac death in the context of the degree of his chronic systolic heart failure due to ischemic cardiomyopathy with LVEF less than 20% per most recent echocardiogram.  Additional potential factors playing a role in today's V-fib arrest include finding of significant hypokalemia, presenting potassium level 2.5.  EDP has discussed with on-call [facility] cardiology, who will formally consult.   Plan: Monitor on telemetry.  Resume home amiodarone  and mexiletine.  Cardiology consulted, as above.  Repeat serum magnesium level in the morning.  Further evaluation management at hypokalemia, as further detailed below, including repeat BMP ordered for 1 AM on 04/09/2023.                 #) Hypokalemia: presenting potassium level noted to be 2.5, notable  in the context of his presenting V-fib arrest.  At risk for hypokalemia in the context of his outpatient diuretic regimen that includes torsemide as well as q. weekly metolazone.  This low potassium level occurred in spite of good compliance with his outpatient potassium chloride 40 mill colons p.o. twice daily.  In the ED today, he has received 40 mill colons of oral potassium chloride as well as 30 mill colons of IV potassium chloride.  Will repeat BMP at 1 AM, to guide additional potassium supplementation efforts.  Of note, his magnesium level was found to be 2.6.  Plan: monitor on tele.  BMP at 1 AM, as above.  CMP, mag level in the AM.  For now, continue outpatient potassium chloride 40 mill colons twice daily, although may need to increase this dose ultimately given his presenting hypokalemia and spite of good compliance thereof.                   #) Acute Kidney Injury: Presenting creatinine 1.83 compared to most recent prior value 1.32 on 04/01/2023.  Appears prerenal in nature as a consequence of transient decline in systemic perfusion as a consequence of his presenting V-fib arrest.  Plan: monitor strict I's & O's and daily weights. Attempt to avoid nephrotoxic agents.  Hold home gabapentin for now.  Refrain from NSAIDs. Repeat CMP in the morning. Check serum magnesium level.  Check urinalysis with microscopy.  Add-on random urine sodium and random urine creatinine.                         #) Chronic systolic heart failure: documented history of such, with most recent echocardiogram performed on 02/15/2023, which is notable for LVEF less than 20%, indeterminate diastolic parameters, normal right ventricular systolic function with additional results as conveyed above.. No clinical or radiographic evidence to suggest acutely decompensated heart failure at this time.,  Including BNP that appears unchanged from most recent prior value.  EDP discussed patient's  case with on-call cardiology, who, at this time, also feel that the patient is clinically euvolemic, and do not currently recommend up regulation of his current diuretic regimen that consists of torsemide 80 mg p.o. every morning, torsemide 40 mg p.o. nightly, as well as q. weekly metolazone.  Cardiology to formally consult .  In terms of goal-directed therapy, it does not appear that the patient is currently on a beta-blocker, ACE inhibitor, ARB, or spironolactone.  He was however on Jardiance.  Plan: monitor strict I's & O's and daily weights. Repeat BMP in AM. Check serum mag level. Continue home diuretic regimen. Continue home Jardiance.  Cardiology to formally consult, as above.                    #) Hyperlipidemia: documented h/o such. On high intensity rosuvastatin as outpatient.   Plan: continue home statin.                  #) Obstructive sleep apnea: Documented history of such, with patient reporting good compliance on home nocturnal CPAP.   Plan: I have placed respiratory therapy  consultation for provision of scheduled nocturnal CPAP.                    #) Type 2 Diabetes Mellitus: documented history of such. Home insulin regimen: None. Home oral hypoglycemic agents: Jardiance.  He is also on Ozempic at home.  Presenting blood sugar: 142. Most recent A1c noted to be 7.5% when checked on 03/28/2023.   Plan: accuchecks QAC and HS with low dose SSI.  Continue home Jardiance.  Hold home Ozempic during this hospitalization.                   #) GERD: documented h/o such; on omeprazole as outpatient.   Plan: continue home PPI.         DVT prophylaxis: SCD's   Code Status: Full code Family Communication: none Disposition Plan: Per Rounding Team Consults called: EDP d/w on-call [facility] cardiology, who will formally consult, as further detailed above;  Admission status: Inpatient     I SPENT GREATER THAN 75  MINUTES  IN CLINICAL CARE TIME/MEDICAL DECISION-MAKING IN COMPLETING THIS ADMISSION.      Chaney Born Kamaljit Hizer DO [facility]  From 7PM - 7AM   04/08/2023, 8:31 PM

## 2023-04-08 NOTE — ED Triage Notes (Signed)
Pt BIB EMS. Pt was sitting outside with family, went unconscious and snoring resp, foaming at mouth. Defibrillator shocked pt twice. Pt states tingling feeling before incident. Pt arrives axox4. EMS BP 120 systolic Pt denies cp at this time.

## 2023-04-08 NOTE — Consult Note (Signed)
Cardiology Consultation   Patient ID: Joe [name] MRN: [id number]; DOB: [birth year]  Admit date: 04/08/2023 Date of Consult: 04/08/2023  PCP:  Lonie Peak, PA-C   [facility] Providers Cardiologist:  [name], MD  Electrophysiologist:  Regan Lemming, MD       Patient Profile:   Joe [name] is a 52 y.o. male with a significant history of heart failure with reduced ejection fraction status post St. Jude CRT-D currently being considered for heart transplant at [facility], coronary artery disease status post percutaneous intervention, recurrent ventricular fibrillation with ICD defibrillations, frequent premature ventricular complexes, hypertension,  and sleep apnea who is being seen 04/08/2023 for the evaluation of cardiac arrest requiring ICD defibrillation in the setting of hypokalemia at the request of Dr. Anitra Lauth.  History of Present Illness:   Joe [name] states at around 4 pm today while sitting in his chair talking to his brother in-law he suddenly felt a tingling and burning sensation throughout his body, similar to the feeling he had with his prior cardiac arrest. He soon found himself awakening on the ground.  Per family, patient was out for 2 minutes for which his brother in-law performed chest compressions and noted that his defibrillator fired twice.  He denies feeling confused upon awakening.  His wife called emergency medical services to the home and he was sent to our emergency department for further evaluation.  Patient reports he was recently hospitalized a week and half ago for cardiac arrest.  Since being discharged, he started noticing swelling in his legs and weight gain of around 16 lbs. Patient states that he took all of his medications as prescribed and didn't miss any of his torsemide for which he takes two tablets twice a day.  He did take his metolazone due to weight increase yesterday as prescribed.  Patient  denies missing of his potassium which he takes twice daily.  He does not report palpitations, dizziness, chest pain, or waking up due to shortness of breath.  He is not able to lay flat in bed, but this is largely unchanged from prior.  He denies any concerns of worsening shortness of breath with exertion.    Patient had ICD interrogated in the emergency department with St. Jude and two defibrillations were reported at 4:41 ATP then defibrillation (interrogation in media and reviewed).  BMP demonstrated a potassium of 2.5 mmol/L.  Labs also was significant for acute kidney injury with creatinine at 1.83 mg/dL, up from baseline of around 1.3 mg/dL.  Of note, patient was recently hospitalized one week ago due to syncope.  Interrogation of ICD revealed possible ventricular fibrillation for which patient received ICD defibrillation.  He was placed on Amiodarone IV initially and then sent home with amiodarone by mouth.  Patient continued on his home mexiletine.  At his prior hospitalization, it was noted that he had significant hypokalemia.  Patient does take periodic metolazone in addition to torsemide for hemodynamic congestion control with his heart failure at home.  Patient did have both his spironolactone and metoprolol succinate discontinued at discharge.   Past Medical History:  Diagnosis Date   Abnormal EKG    Acute systolic heart failure (HCC) 09/06/2017   Angina pectoris (HCC) 09/05/2017   Body mass index 45.0-49.9, adult (HCC)    CAD S/P percutaneous coronary angioplasty 09/07/2017   mLAD and dLAD PCI with DES 09/06/17   CHF (congestive heart failure) (HCC)    Chronic systolic (congestive) heart failure (HCC)  03/19/2019   Complication of anesthesia 1995   "had hard time waking up; breathing w/vasectomy"   Coronary artery disease    Erectile dysfunction    Essential hypertension, benign    Fatigue    GERD (gastroesophageal reflux disease)    Gout    High cholesterol    "just started tx  yesterday" (09/06/2017)   History of gout    "RX prn" (09/06/2017)   Ischemic cardiomyopathy 09/07/2017   EF 25-35%   Muscular chest pain    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   Below Left Nare (MOH's)   Obesity    Obstructive sleep apnea    OSA on CPAP    Plantar fasciitis    Pre-operative clearance 02/11/2018   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/02/2019   Saint Jude   Right bundle branch block    Shortness of breath 09/05/2017   Testosterone deficiency    Type 2 diabetes mellitus with complication, without long-term current use of insulin (HCC) 09/05/2017   Type II diabetes mellitus (HCC)    "started tx 08/28/2017"    Past Surgical History:  Procedure Laterality Date   BACK SURGERY     BIV ICD INSERTION CRT-D N/A 03/19/2019   Procedure: BIV ICD INSERTION CRT-D;  Surgeon: Regan Lemming, MD;  Location: [facility] INVASIVE CV LAB;  Service: Cardiovascular;  Laterality: N/A;   CORONARY ANGIOPLASTY WITH STENT PLACEMENT  09/06/2017   "3 stents"   CORONARY STENT INTERVENTION N/A 09/06/2017   Procedure: CORONARY STENT INTERVENTION;  Surgeon: Corky Crafts, MD;  Location: [facility] INVASIVE CV LAB;  Service: Cardiovascular;  Laterality: N/A;   CORONARY STENT INTERVENTION N/A 02/15/2018   Procedure: CORONARY STENT INTERVENTION;  Surgeon: Corky Crafts, MD;  Location: [facility] INVASIVE CV LAB;  Service: Cardiovascular;  Laterality: N/A;   KNEE ARTHROSCOPY Right    LEFT HEART CATH AND CORONARY ANGIOGRAPHY N/A 09/06/2017   Procedure: LEFT HEART CATH AND CORONARY ANGIOGRAPHY;  Surgeon: Corky Crafts, MD;  Location: [facility] INVASIVE CV LAB;  Service: Cardiovascular;  Laterality: N/A;   LEFT HEART CATH AND CORONARY ANGIOGRAPHY N/A 02/15/2018   Procedure: LEFT HEART CATH AND CORONARY ANGIOGRAPHY;  Surgeon: Corky Crafts, MD;  Location: [facility] INVASIVE CV LAB;  Service: Cardiovascular;  Laterality: N/A;   LEFT HEART CATH AND CORONARY ANGIOGRAPHY N/A  06/27/2018   Procedure: LEFT HEART CATH AND CORONARY ANGIOGRAPHY;  Surgeon: Corky Crafts, MD;  Location: [facility] INVASIVE CV LAB;  Service: Cardiovascular;  Laterality: N/A;   LEFT HEART CATH AND CORONARY ANGIOGRAPHY N/A 06/28/2020   Procedure: LEFT HEART CATH AND CORONARY ANGIOGRAPHY;  Surgeon: Swaziland, Peter M, MD;  Location: [facility] INVASIVE CV LAB;  Service: Cardiovascular;  Laterality: N/A;   LUMBAR SPINE SURGERY  ~ 2001   Intradiscal Electrothermoplasty   RIGHT/LEFT HEART CATH AND CORONARY ANGIOGRAPHY N/A 03/07/2021   Procedure: RIGHT/LEFT HEART CATH AND CORONARY ANGIOGRAPHY;  Surgeon: Laurey Morale, MD;  Location: [facility] INVASIVE CV LAB;  Service: Cardiovascular;  Laterality: N/A;   RIGHT/LEFT HEART CATH AND CORONARY ANGIOGRAPHY N/A 02/23/2023   Procedure: RIGHT/LEFT HEART CATH AND CORONARY ANGIOGRAPHY;  Surgeon: Dolores Patty, MD;  Location: [facility] INVASIVE CV LAB;  Service: Cardiovascular;  Laterality: N/A;   VASECTOMY  1995     Home Medications:  Prior to Admission medications   Medication Sig Start Date End Date Taking? Authorizing Provider  acetaminophen (TYLENOL) 500 MG tablet Take 1,000 mg by mouth every 6 (six) hours as needed for moderate pain  or headache.   Yes [provider]  allopurinol (ZYLOPRIM) 100 MG tablet Take 100 mg by mouth daily.   Yes [provider]  amiodarone (PACERONE) 400 MG tablet Tablet 1 tablet twice a day for 7 days, then 1 tablet daily thereafter Patient taking differently: Take 400 mg by mouth daily. 04/01/23  Yes Meng, Wynema Birch, PA  Aromatic Inhalants (VICKS VAPOINHALER) INHA Inhale 1 puff into the lungs daily as needed (congestion).   Yes [provider]  aspirin EC 81 MG tablet Take 1 tablet (81 mg total) by mouth daily. 09/05/17  Yes Georgeanna Lea, MD  colchicine 0.6 MG tablet Take 0.6 mg by mouth daily as needed (Gout). 05/10/20  Yes [provider]  diclofenac Sodium (VOLTAREN) 1 % GEL APPLY 1 G TOPICALLY  DAILY. Patient taking differently: Apply 2 g topically daily as needed (for pain). 01/11/23  Yes Standiford, Jenelle Mages, DPM  empagliflozin (JARDIANCE) 10 MG TABS tablet Take 1 tablet (10 mg total) by mouth daily before breakfast. 02/23/23  Yes Andrey Farmer, PA-C  escitalopram (LEXAPRO) 10 MG tablet Take 10 mg by mouth at bedtime. 06/24/21  Yes [provider]  gabapentin (NEURONTIN) 100 MG capsule Take 100-200 mg by mouth 2 (two) times daily. 11/09/21  Yes [provider]  ibuprofen (ADVIL) 200 MG tablet Take 800 mg by mouth every 8 (eight) hours as needed for headache or moderate pain.   Yes [provider]  metolazone (ZAROXOLYN) 2.5 MG tablet Take 1 tablet (2.5 mg total) by mouth daily. Take one tablet (2.5 mg) once weekly.  Take extra potassium (20 meq) when  you take metolazone Patient taking differently: Take 2.5 mg by mouth See admin instructions. Take one tablet (2.5 mg) once weekly on Saturday.  May take 1 tablet during the week for weight gain. Take extra potassium (20 meq) when  you take metolazone 02/23/23  Yes Bensimhon, Bevelyn Buckles, MD  mexiletine (MEXITIL) 250 MG capsule Take 1 capsule (250 mg total) by mouth 2 (two) times daily. 12/18/22  Yes Camnitz, Andree Coss, MD  nitroGLYCERIN (NITROSTAT) 0.4 MG SL tablet Place 1 tablet (0.4 mg total) under the tongue every 5 (five) minutes as needed for chest pain. 03/16/23  Yes Milford, Anderson Malta, FNP  omeprazole (PRILOSEC) 40 MG capsule Take 1 capsule (40 mg total) by mouth daily. 01/14/19  Yes Georgeanna Lea, MD  OZEMPIC, 1 MG/DOSE, 4 MG/3ML SOPN Inject 1 mg into the skin every Monday. 12/28/22  Yes [provider]  Polyethylene Glycol 400 (BLINK TEARS OP) Place 2 drops into both eyes 4 (four) times daily as needed (for dry eyes).   Yes [provider]  potassium chloride SA (KLOR-CON M) 20 MEQ tablet Take 2 tablets (40 mEq total) by mouth every morning AND 1 tablet (20 mEq total) every evening. On  days you take metolazone take 40 meq of potassium in the morning and 40 in the evening.. Patient taking differently: Take 2 tablets (40 mEq total) by mouth every morning AND 2 tablet (40 mEq total) every evening. On days you take metolazone take 20-40 meq of potassium in the morning and 20-40 in the evening.. 02/23/23  Yes Bensimhon, Bevelyn Buckles, MD  ranolazine (RANEXA) 500 MG 12 hr tablet Take 1 tablet (500 mg total) by mouth 2 (two) times daily. 07/18/22  Yes Georgeanna Lea, MD  rosuvastatin (CRESTOR) 40 MG tablet Take 1 tablet (40 mg total) by mouth daily. Patient taking differently: Take 40 mg by  mouth at bedtime. 07/18/22  Yes Georgeanna Lea, MD  torsemide (DEMADEX) 20 MG tablet Take 2 tablets (40 mg total) by mouth every morning AND 2 tablets (40 mg total) every evening. 02/23/23 05/24/23 Yes Bensimhon, Bevelyn Buckles, MD  triamcinolone cream (KENALOG) 0.1 % Apply 1 Application topically 2 (two) times daily. 10/09/22  Yes [provider]    Inpatient Medications: Scheduled Meds:  [START ON 04/09/2023] amiodarone  400 mg Oral Daily   [START ON 04/09/2023] aspirin EC  81 mg Oral Daily   [START ON 04/09/2023] empagliflozin  10 mg Oral QAC breakfast   [START ON 04/09/2023] insulin aspart  0-9 Units Subcutaneous TID WC   mexiletine  250 mg Oral BID   [START ON 04/09/2023] pantoprazole  40 mg Oral Daily   potassium chloride  40 mEq Oral BID   ranolazine  500 mg Oral BID   [START ON 04/09/2023] rosuvastatin  40 mg Oral Daily   [START ON 04/09/2023] torsemide  80 mg Oral q morning   And   torsemide  40 mg Oral QHS   Continuous Infusions:   PRN Meds: acetaminophen **OR** acetaminophen, melatonin  Allergies:   No Known Allergies  Social History:   Social History   Socioeconomic History   Marital status: Married    Spouse name: Not on file   Number of children: Not on file   Years of education: Not on file   Highest education level: Not on file  Occupational History   Not on  file  Tobacco Use   Smoking status: Never   Smokeless tobacco: Never  Vaping Use   Vaping status: Never Used  Substance and Sexual Activity   Alcohol use: No   Drug use: Never   Sexual activity: Not on file  Other Topics Concern   Not on file  Social History Narrative   Not on file   Social Determinants of Health   Financial Resource Strain: Not on file  Food Insecurity: No Food Insecurity (04/08/2023)   Hunger Vital Sign    Worried About Running Out of Food in the Last Year: Never true    Ran Out of Food in the Last Year: Never true  Transportation Needs: No Transportation Needs (04/08/2023)   PRAPARE - Administrator, Civil Service (Medical): No    Lack of Transportation (Non-Medical): No  Physical Activity: Not on file  Stress: Not on file  Social Connections: Unknown (10/28/2021)   [facility]   Social Network    Social Network: Not on file  Intimate Partner Violence: Not At Risk (04/08/2023)   Humiliation, Afraid, Rape, and Kick questionnaire    Fear of Current or Ex-Partner: No    Emotionally Abused: No    Physically Abused: No    Sexually Abused: No    Family History:   Family History  Problem Relation Age of Onset   Lymphoma Father    Diabetes Mellitus II Brother    Diabetes Mellitus II Paternal Grandfather    Lung cancer Mother    Brain cancer Mother      ROS:  Please see the history of present illness.  All other ROS reviewed and negative.     Physical Exam/Data:   Vitals:   04/08/23 2000 04/08/23 2100 04/08/23 2200 04/08/23 2245  BP: (!) 93/52 101/68 100/65 113/75  Pulse: 71 72 69   Resp: (!) 22 (!) 25 (!) 22 20  Temp:   98 F (36.7 C)  98.5 F (36.9 C)  TempSrc:    Oral  SpO2: 100% 97% 99% 96%  Weight:      Height:        Intake/Output Summary (Last 24 hours) at 04/08/2023 2324 Last data filed at 04/08/2023 2122 Gross per 24 hour  Intake 437 ml  Output 700 ml  Net -263 ml      04/08/2023     6:09 PM 03/30/2023    1:16 PM 03/09/2023   12:15 PM  Last 3 Weights  Weight (lbs) 315 lb 307 lb 12.8 oz 310 lb  Weight (kg) 142.883 kg 139.617 kg 140.615 kg     Body mass index is 39.37 kg/m.  General:  Well nourished, well developed, in no acute distress HEENT: normal Neck: no JVD Vascular: No carotid bruits; Distal pulses 2+ bilaterally Cardiac:  Distant, normal S1, S2; RRR; no murmur  Lungs:  clear to auscultation bilaterally, no wheezing, rhonchi or rales  Abd: soft, nontender, no hepatomegaly  Ext: Mild stasis dermatitis; 1+ bilateral lower extremity edema up to mid-shin Musculoskeletal:  No deformities, BUE and BLE strength normal and equal Skin: warm and dry  Neuro:  CNs 2-12 intact, no focal abnormalities noted Psych:  Normal affect   EKG:  The EKG was personally reviewed and demonstrates:  04/08/23 (7:22pm):  Atrial-sensed and ventricular-paced rhythm Telemetry:  Telemetry was personally reviewed and demonstrates:  Ventricular-paced rhythm  Relevant CV Studies: # Coronary angiography and right heart catheterization 02/23/23: Findings: RA = 18 RV = 40/20 PA = 41/35 (36) PCW = 27 Fick cardiac output/index = 5.8/2.2 TD CO/CI = 5.4/2.0 PVR = 1.6 WU FA sat = 92% PA sat = 64%, 64% Assessment: 1. Stable 3v CAD 2. Severe biventricular failure with elevated filling pressures and very low PAPi suggestive of poor RV function  # Echocardiogram 02/23/23: IMPRESSIONS   1. Left ventricular ejection fraction, by estimation, is <20%. The left  ventricle has severely decreased function. The left ventricle demonstrates  global hypokinesis. The left ventricular internal cavity size was severely  dilated. Left ventricular  diastolic parameters are indeterminate.   2. Right ventricular systolic function is normal. The right ventricular  size is normal.   3. Right atrial size was mildly dilated.   4. The mitral valve is normal in structure. Mild mitral valve  regurgitation. No  evidence of mitral stenosis.   5. The aortic valve is normal in structure. Aortic valve regurgitation is  not visualized. No aortic stenosis is present.   6. The inferior vena cava is normal in size with greater than 50%  respiratory variability, suggesting right atrial pressure of 3 mmHg.   Laboratory Data:  High Sensitivity Troponin:   Recent Labs  Lab 03/28/23 2205 03/29/23 0024 03/29/23 0248  TROPONINIHS 24* 30* 30*     Chemistry Recent Labs  Lab 04/08/23 1819  NA 136  K 2.5*  CL 90*  CO2 30  GLUCOSE 142*  BUN 19  CREATININE 1.83*  CALCIUM 8.9  MG 2.6*  GFRNONAA 44*  ANIONGAP 16*    Recent Labs  Lab 04/08/23 1819  PROT 7.1  ALBUMIN 3.3*  AST 28  ALT 29  ALKPHOS 128*  BILITOT 1.2   Lipids No results for input(s): "CHOL", "TRIG", "HDL", "LABVLDL", "LDLCALC", "CHOLHDL" in the last 168 hours.  Hematology Recent Labs  Lab 04/08/23 1819  WBC 7.8  RBC 4.46  HGB 14.0  HCT 39.7  MCV 89.0  MCH 31.4  MCHC 35.3  RDW 14.0  PLT  229   Thyroid No results for input(s): "TSH", "FREET4" in the last 168 hours.  BNP Recent Labs  Lab 04/08/23 1820  BNP 182.4*    DDimer No results for input(s): "DDIMER" in the last 168 hours.   Radiology/Studies:  DG Chest Port 1 View  Result Date: 04/08/2023 CLINICAL DATA:  syncope EXAM: PORTABLE CHEST 1 VIEW COMPARISON:  Chest x-ray 03/28/2023 FINDINGS: Enlarged cardiac silhouette. The heart and mediastinal contours are unchanged. Left chest wall triple lead pacemaker defibrillator. No focal consolidation. No pulmonary edema. No pleural effusion. No pneumothorax. No acute osseous abnormality. IMPRESSION: No active disease. Electronically Signed   By: Tish Frederickson M.D.   On: 04/08/2023 20:10     Assessment and Plan:   Cardiac arrest: Patient is status post arrhythmia induced cardiac arrest...  Likely reversible in the setting of profound hypokalemia.  Current taking home dose of amiodarone and mexiletine.  Currently  receiving IV potassium.  Magnesium is also low.   --Hold metolazone given likely larger contributor to hypokalemia. --Please give IV potassium with a goal >4 mmol/L. --Please replete magnesium with a goal >3 mmol/L. --We will discuss case further with electrophysiology.   Hypokalemia: Likely iatrogenic in the setting of taking both torsemide and as needed metolazone.  Recently taken off spironolactone, presumably for acute kidney injury.  Now with arrhythmia induced cardiac arrest requiring two ICD defibrillations. --Please give IV potassium with a goal >4 mmol/L. --Please replete magnesium with a goal >3 mmol/L. --BMP with Mg twice daily.   --Hold  home metolazone for now.  Tachyarrhythmias: Frequent premature ventricular complexes reported and ventricular fibrillation.  Recent ICD defibrillation.  Recent event possibly reversible due to hypokalemia.  Currently taking amiodarone and mexiletine. --Continue both antiarrhythmic medications.   --We will discuss case further with electrophysiology.    Cardiomyopathy: Likely multifactorial  (ischemic cardiomyopathy, obesity, diabetes, hypertension) with now heart failure with reduced ejection fraction.  Patient with NYHA class III symptoms.  Spironolactone and metoprolol succinate recently held post discharge at his previous hospitalization.  Currently not taking Entresto.  On empagliflozin only.  Reports weight gain with only noted lower extremity edema with mild hemodynamic congestion. --Reasonable to double home torsemide dose to 80 mg bid for a goal net negative 1-2L and weight decrease by at least 0.5 kg for now.  Coronary artery disease: Multivessel coronary artery disease status post percutaneous interventions.  Recent coronary angiography on 02/23/23 revealed stable disease with patent stents.  Currently taking both aspirin and rosuvastatin.  Currently without angina and ventricular fibrillation is likely due to his cardiomyopathy and not  anginal equivalent. --Continue home guideline directed medical therapy.    Risk Assessment/Risk Scores:        New York Heart Association (NYHA) Functional Class NYHA Class III        For questions or updates, please contact [facility] Please consult [email or web address] for contact info under    Signed, Judie Grieve, MD  04/08/2023 11:24 PM

## 2023-04-08 NOTE — ED Provider Notes (Signed)
[place] EMERGENCY DEPARTMENT AT [facility] Provider Note   CSN: [id number] Arrival date & time: 04/08/23  1803     History  Chief Complaint  Patient presents with   Pacemaker Problem    Joe [name] is a 52 y.o. male.  Pt is a 52 yrs old Male with h/o morbid obesity, DM2, HTN, Hyperlipidemia, CAD and systolic HF (LVEF 20-25%) with recent hospitalization last week for a syncopal event and after interrogation of his defibrillator appeared to be from a V-fib event. Patient was started on Amiodarone infusion, subsequently switched to oral amiodarone and is currently being evaluated at [facility] for heart transplant.  At this time he is not a great candidate for an LVAD because of damage to the right side of his heart.  Patient is coming in today with another episode of syncope.  Patient's wife and the patient give the history.  She reports that yesterday he had a great day and was feeling well throughout the day however today he was feeling very tired.  He felt like he had had some extra fluid in his legs this morning but was in a hurry and did not weigh this morning.  Wife reports that he is up 16 pounds based on yesterday's weight.  He was sitting outside talking with family and reports right after he had taken a sip of soda he suddenly started feeling numb and tingly all over and then woke up with his brother-in-law doing compressions.  His wife reports he just started leaning back with some mild shaking in his arms and went unconscious.  The defibrillator appeared to fire twice and her brother-in-law is a cardiac nurse and he started CPR.  She reports he came back in approximately 2 minutes.  Patient reports currently he feels tired and a little sore over his defibrillator box but denies any chest pain or shortness of breath.  He has been compliant with his medications.  No injury from this event.  The history is provided by the patient, the spouse and medical records.        Home Medications Prior to Admission medications   Medication Sig Start Date End Date Taking? Authorizing Provider  acetaminophen (TYLENOL) 500 MG tablet Take 1,000 mg by mouth every 6 (six) hours as needed for moderate pain or headache.    [provider]  allopurinol (ZYLOPRIM) 100 MG tablet Take 100 mg by mouth daily.    [provider]  amiodarone (PACERONE) 400 MG tablet Tablet 1 tablet twice a day for 7 days, then 1 tablet daily thereafter 04/01/23   Azalee Course, PA  Aromatic Inhalants (VICKS VAPOINHALER) INHA Inhale 1 puff into the lungs daily as needed (congestion).    [provider]  aspirin EC 81 MG tablet Take 1 tablet (81 mg total) by mouth daily. 09/05/17   Georgeanna Lea, MD  colchicine 0.6 MG tablet Take 0.6 mg by mouth daily as needed (Gout). 05/10/20   [provider]  diclofenac Sodium (VOLTAREN) 1 % GEL APPLY 1 G TOPICALLY DAILY. Patient taking differently: Apply 2 g topically daily as needed (for pain). 01/11/23   Standiford, Jenelle Mages, DPM  empagliflozin (JARDIANCE) 10 MG TABS tablet Take 1 tablet (10 mg total) by mouth daily before breakfast. 02/23/23   Andrey Farmer, PA-C  escitalopram (LEXAPRO) 10 MG tablet Take 10 mg by mouth at bedtime. 06/24/21   [provider]  gabapentin (NEURONTIN) 100 MG capsule Take 100-200 mg by mouth See  admin instructions. Take 2 capsules by mouth in the morning and 1 capsule at bedtime or vice versa 11/09/21   [provider]  ibuprofen (ADVIL) 200 MG tablet Take 800 mg by mouth every 8 (eight) hours as needed for headache or moderate pain.    [provider]  metolazone (ZAROXOLYN) 2.5 MG tablet Take 1 tablet (2.5 mg total) by mouth daily. Take one tablet (2.5 mg) once weekly.  Take extra potassium (20 meq) when  you take metolazone Patient taking differently: Take 2.5 mg by mouth See admin instructions. Take one tablet (2.5 mg) once weekly on Saturday.  May take 1 tablet  during the week for weight gain. Take extra potassium (20 meq) when  you take metolazone 02/23/23   Bensimhon, Bevelyn Buckles, MD  mexiletine (MEXITIL) 250 MG capsule Take 1 capsule (250 mg total) by mouth 2 (two) times daily. 12/18/22   Camnitz, Andree Coss, MD  nitroGLYCERIN (NITROSTAT) 0.4 MG SL tablet Place 1 tablet (0.4 mg total) under the tongue every 5 (five) minutes as needed for chest pain. 03/16/23   Milford, Anderson Malta, FNP  omeprazole (PRILOSEC) 40 MG capsule Take 1 capsule (40 mg total) by mouth daily. 01/14/19   Georgeanna Lea, MD  OZEMPIC, 1 MG/DOSE, 4 MG/3ML SOPN Inject 1 mg into the skin every Monday. 12/28/22   [provider]  Polyethylene Glycol 400 (BLINK TEARS OP) Place 2 drops into both eyes 4 (four) times daily as needed (for dry eyes).    [provider]  potassium chloride SA (KLOR-CON M) 20 MEQ tablet Take 2 tablets (40 mEq total) by mouth every morning AND 1 tablet (20 mEq total) every evening. On days you take metolazone take 40 meq of potassium in the morning and 40 in the evening.. Patient taking differently: Take 2 tablets (40 mEq total) by mouth every morning AND 2 tablet (40 mEq total) every evening. On days you take metolazone take 40 meq of potassium in the morning and 40 in the evening.. 02/23/23   Bensimhon, Bevelyn Buckles, MD  ranolazine (RANEXA) 500 MG 12 hr tablet Take 1 tablet (500 mg total) by mouth 2 (two) times daily. 07/18/22   Georgeanna Lea, MD  rosuvastatin (CRESTOR) 40 MG tablet Take 1 tablet (40 mg total) by mouth daily. 07/18/22   Georgeanna Lea, MD  torsemide (DEMADEX) 20 MG tablet Take 2 tablets (40 mg total) by mouth every morning AND 2 tablets (40 mg total) every evening. 02/23/23 05/24/23  Bensimhon, Bevelyn Buckles, MD  triamcinolone cream (KENALOG) 0.1 % Apply 1 Application topically 2 (two) times daily. 10/09/22   [provider]      Allergies    Patient has no known allergies.    Review of Systems   Review of Systems  Physical  Exam Updated Vital Signs BP 94/61   Pulse (!) 35   Temp 97.8 F (36.6 C) (Oral)   Resp 18   Ht 6\' 3"  (1.905 m)   Wt (!) 142.9 kg   SpO2 100%   BMI 39.37 kg/m  Physical Exam Vitals and nursing note reviewed.  Constitutional:      General: He is not in acute distress.    Appearance: He is well-developed.  HENT:     Head: Normocephalic and atraumatic.  Eyes:     Conjunctiva/sclera: Conjunctivae normal.     Pupils: Pupils are equal, round, and reactive to light.  Cardiovascular:     Rate and Rhythm: Normal rate and regular  rhythm.     Heart sounds: No murmur heard. Pulmonary:     Effort: Pulmonary effort is normal. No respiratory distress.     Breath sounds: Normal breath sounds. No wheezing or rales.     Comments: Defibrillator present in the left upper chest Abdominal:     General: There is no distension.     Palpations: Abdomen is soft.     Tenderness: There is no abdominal tenderness. There is no guarding or rebound.  Musculoskeletal:        General: No tenderness. Normal range of motion.     Cervical back: Normal range of motion and neck supple.     Right lower leg: Edema present.     Left lower leg: Edema present.  Skin:    General: Skin is warm and dry.     Findings: No erythema or rash.  Neurological:     Mental Status: He is alert and oriented to person, place, and time. Mental status is at baseline.  Psychiatric:        Behavior: Behavior normal.     ED Results / Procedures / Treatments   Labs (all labs ordered are listed, but only abnormal results are displayed) Labs Reviewed  COMPREHENSIVE METABOLIC PANEL - Abnormal; Notable for the following components:      Result Value   Potassium 2.5 (*)    Chloride 90 (*)    Glucose, Bld 142 (*)    Creatinine, Ser 1.83 (*)    Albumin 3.3 (*)    Alkaline Phosphatase 128 (*)    GFR, Estimated 44 (*)    Anion gap 16 (*)    All other components within normal limits  MAGNESIUM - Abnormal; Notable for the  following components:   Magnesium 2.6 (*)    All other components within normal limits  BRAIN NATRIURETIC PEPTIDE - Abnormal; Notable for the following components:   B Natriuretic Peptide 182.4 (*)    All other components within normal limits  CBC WITH DIFFERENTIAL/PLATELET    EKG EKG Interpretation Date/Time:  Sunday April 08 2023 18:10:43 EDT Ventricular Rate:  76 PR Interval:  260 QRS Duration:  170 QT Interval:  466 QTC Calculation: 524 R Axis:   268  Text Interpretation: Atrial-sensed ventricular-paced rhythm No further analysis attempted due to paced rhythm No significant change since last tracing Confirmed by Gwyneth Sprout ([id number]) on 04/08/2023 6:21:59 PM  Radiology No results found.  Procedures Procedures    Medications Ordered in ED Medications  potassium chloride 10 mEq in 100 mL IVPB (10 mEq Intravenous New Bag/Given 04/08/23 1917)  potassium chloride (KLOR-CON) packet 40 mEq (has no administration in time range)    ED Course/ Medical Decision Making/ A&P                                 Medical Decision Making Amount and/or Complexity of Data Reviewed Labs: ordered. Radiology: ordered.  Risk Prescription drug management.   Pt with multiple medical problems and comorbidities and presenting today with a complaint that caries a high risk for morbidity and mortality.  Here today after a syncopal event at home and 2 fires of his defibrillator.  Concern for recurrent V-fib event in the setting of his significant heart failure and reduced EF.  Also he appears to be up 16 pounds from his desired weight which is 299.  Patient currently is awake and alert and just complaining of some mild  soreness over his defibrillator.  Denies any chest pain or shortness of breath today.  Low suspicion for infectious etiology.  Concern for worsening heart failure as well.  Will interrogate patient's [facility] pacemaker.  Patient was in V-fib today went directly to V-fib from a  sinus rhythm and was shocked twice by the defibrillator.  I have independently visualized and interpreted pt's images today.  Chest x-ray without significant evidence of fluid overload at this time.  I independently interpreted patient's EKG and labs.  EKG with a paced rhythm, magnesium mildly elevated at 2.6, CMP with recurrent hypokalemia today of 2.5 recurrent AKI with creatinine of 1.83 and mildly elevated BNP at 182.  CBC within normal limits.  Will need to replace potassium IV and orally.  Currently patient is taking 80 mg of potassium daily but also takes 80 mg of torsemide daily and takes metolazone on the weekends.  He is up 15 pounds but discussed with cardiology.  They will consult on the patient to decide if he needs diuresis.  Will admit for potassium replacement.  Currently defibrillator is working appropriately.  Will consult hospitalist for admission.  Discussed with the family they are comfortable with this plan.  CRITICAL CARE Performed by: Aariv Medlock Total critical care time: 30 minutes Critical care time was exclusive of separately billable procedures and treating other patients. Critical care was necessary to treat or prevent imminent or life-threatening deterioration. Critical care was time spent personally by me on the following activities: development of treatment plan with patient and/or surrogate as well as nursing, discussions with consultants, evaluation of patient's response to treatment, examination of patient, obtaining history from patient or surrogate, ordering and performing treatments and interventions, ordering and review of laboratory studies, ordering and review of radiographic studies, pulse oximetry and re-evaluation of patient's condition.         Final Clinical Impression(s) / ED Diagnoses Final diagnoses:  Ventricular fibrillation (HCC)  Hypokalemia  AKI (acute kidney injury) [facility])    Rx / DC Orders ED Discharge Orders     None          Gwyneth Sprout, MD 04/08/23 2003

## 2023-04-08 NOTE — ED Notes (Signed)
ED TO INPATIENT HANDOFF REPORT  ED Nurse Name and Phone #: Raynelle Fanning, RN  S Name/Age/Gender Joe [name] 52 y.o. male Room/Bed: 019C/019C  Code Status   Code Status: Full Code  Home/SNF/Other Home Patient oriented to: self, place, time, and situation Is this baseline? Yes   Triage Complete: Triage complete  Chief Complaint Cardiac arrest with ventricular fibrillation (HCC) [I46.9, I49.01]  Triage Note Pt BIB EMS. Pt was sitting outside with family, went unconscious and snoring resp, foaming at mouth. Defibrillator shocked pt twice. Pt states tingling feeling before incident. Pt arrives axox4. EMS BP 120 systolic Pt denies cp at this time.    Allergies No Known Allergies  Level of Care/Admitting Diagnosis ED Disposition     ED Disposition  Admit   Condition  --   Comment  Hospital Area: [facility] [[id number]]  Level of Care: Progressive [102]  Admit to Progressive based on following criteria: Other see comments  Admit to Progressive based on following criteria: MULTISYSTEM THREATS such as stable sepsis, metabolic/electrolyte imbalance with or without encephalopathy that is responding to early treatment.  Admit to Progressive based on following criteria: CARDIOVASCULAR & THORACIC of moderate stability with acute coronary syndrome symptoms/low risk myocardial infarction/hypertensive urgency/arrhythmias/heart failure potentially compromising stability and stable post cardiovascular intervention patients.  Comments: sdu  May admit patient to [facility] or [facility] if equivalent level of care is available:: No  Covid Evaluation: Asymptomatic - no recent exposure (last 10 days) testing not required  Diagnosis: Cardiac arrest with ventricular fibrillation [facility]) [[id number]]  Admitting Physician: Angie Fava [[id number]]  Attending Physician: Angie Fava [[id number]]  Certification:: I certify this patient will need inpatient services for at least 2  midnights  Expected Medical Readiness: 04/10/2023          B Medical/Surgery History Past Medical History:  Diagnosis Date   Abnormal EKG    Acute systolic heart failure (HCC) 09/06/2017   Angina pectoris (HCC) 09/05/2017   Body mass index 45.0-49.9, adult (HCC)    CAD S/P percutaneous coronary angioplasty 09/07/2017   mLAD and dLAD PCI with DES 09/06/17   CHF (congestive heart failure) (HCC)    Chronic systolic (congestive) heart failure (HCC) 03/19/2019   Complication of anesthesia 1995   "had hard time waking up; breathing w/vasectomy"   Coronary artery disease    Erectile dysfunction    Essential hypertension, benign    Fatigue    GERD (gastroesophageal reflux disease)    Gout    High cholesterol    "just started tx yesterday" (09/06/2017)   History of gout    "RX prn" (09/06/2017)   Ischemic cardiomyopathy 09/07/2017   EF 25-35%   Muscular chest pain    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   Below Left Nare (MOH's)   Obesity    Obstructive sleep apnea    OSA on CPAP    Plantar fasciitis    Pre-operative clearance 02/11/2018   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/02/2019   [employer]   Right bundle branch block    Shortness of breath 09/05/2017   Testosterone deficiency    Type 2 diabetes mellitus with complication, without long-term current use of insulin (HCC) 09/05/2017   Type II diabetes mellitus (HCC)    "started tx 08/28/2017"   Past Surgical History:  Procedure Laterality Date   BACK SURGERY     BIV ICD INSERTION CRT-D N/A 03/19/2019   Procedure: BIV ICD INSERTION CRT-D;  Surgeon:  Regan Lemming, MD;  Location: [facility] INVASIVE CV LAB;  Service: Cardiovascular;  Laterality: N/A;   CORONARY ANGIOPLASTY WITH STENT PLACEMENT  09/06/2017   "3 stents"   CORONARY STENT INTERVENTION N/A 09/06/2017   Procedure: CORONARY STENT INTERVENTION;  Surgeon: Corky Crafts, MD;  Location: [facility] INVASIVE CV LAB;  Service:  Cardiovascular;  Laterality: N/A;   CORONARY STENT INTERVENTION N/A 02/15/2018   Procedure: CORONARY STENT INTERVENTION;  Surgeon: Corky Crafts, MD;  Location: [facility] INVASIVE CV LAB;  Service: Cardiovascular;  Laterality: N/A;   KNEE ARTHROSCOPY Right    LEFT HEART CATH AND CORONARY ANGIOGRAPHY N/A 09/06/2017   Procedure: LEFT HEART CATH AND CORONARY ANGIOGRAPHY;  Surgeon: Corky Crafts, MD;  Location: [facility] INVASIVE CV LAB;  Service: Cardiovascular;  Laterality: N/A;   LEFT HEART CATH AND CORONARY ANGIOGRAPHY N/A 02/15/2018   Procedure: LEFT HEART CATH AND CORONARY ANGIOGRAPHY;  Surgeon: Corky Crafts, MD;  Location: [facility] INVASIVE CV LAB;  Service: Cardiovascular;  Laterality: N/A;   LEFT HEART CATH AND CORONARY ANGIOGRAPHY N/A 06/27/2018   Procedure: LEFT HEART CATH AND CORONARY ANGIOGRAPHY;  Surgeon: Corky Crafts, MD;  Location: [facility] INVASIVE CV LAB;  Service: Cardiovascular;  Laterality: N/A;   LEFT HEART CATH AND CORONARY ANGIOGRAPHY N/A 06/28/2020   Procedure: LEFT HEART CATH AND CORONARY ANGIOGRAPHY;  Surgeon: Swazil[place], Peter M, MD;  Location: [facility] INVASIVE CV LAB;  Service: Cardiovascular;  Laterality: N/A;   LUMBAR SPINE SURGERY  ~ 2001   Intradiscal Electrothermoplasty   RIGHT/LEFT HEART CATH AND CORONARY ANGIOGRAPHY N/A 03/07/2021   Procedure: RIGHT/LEFT HEART CATH AND CORONARY ANGIOGRAPHY;  Surgeon: Laurey Morale, MD;  Location: [facility] INVASIVE CV LAB;  Service: Cardiovascular;  Laterality: N/A;   RIGHT/LEFT HEART CATH AND CORONARY ANGIOGRAPHY N/A 02/23/2023   Procedure: RIGHT/LEFT HEART CATH AND CORONARY ANGIOGRAPHY;  Surgeon: Dolores Patty, MD;  Location: [facility] INVASIVE CV LAB;  Service: Cardiovascular;  Laterality: N/A;   VASECTOMY  1995     A IV Location/Drains/Wounds Patient Lines/Drains/Airways Status     Active Line/Drains/Airways     Name Placement date Placement time Site Days   Peripheral IV 04/08/23 18 G Anterior;Distal;Right Forearm 04/08/23  1811  Forearm   less than 1            Intake/Output Last 24 hours  Intake/Output Summary (Last 24 hours) at 04/08/2023 2209 Last data filed at 04/08/2023 2122 Gross per 24 hour  Intake 437 ml  Output 700 ml  Net -263 ml    Labs/Imaging Results for orders placed or performed during the hospital encounter of 04/08/23 (from the past 48 hour(s))  CBC with Differential/Platelet     Status: None   Collection Time: 04/08/23  6:19 PM  Result Value Ref Range   WBC 7.8 4.0 - 10.5 K/uL   RBC 4.46 4.22 - 5.81 MIL/uL   Hemoglobin 14.0 13.0 - 17.0 g/dL   HCT 95.6 21.3 - 08.6 %   MCV 89.0 80.0 - 100.0 fL   MCH 31.4 26.0 - 34.0 pg   MCHC 35.3 30.0 - 36.0 g/dL   RDW 57.8 46.9 - 62.9 %   Platelets 229 150 - 400 K/uL   nRBC 0.0 0.0 - 0.2 %   Neutrophils Relative % 70 %   Neutro Abs 5.5 1.7 - 7.7 K/uL   Lymphocytes Relative 12 %   Lymphs Abs 1.0 0.7 - 4.0 K/uL   Monocytes Relative 12 %   Monocytes Absolute 0.9 0.1 - 1.0 K/uL  Eosinophils Relative 4 %   Eosinophils Absolute 0.3 0.0 - 0.5 K/uL   Basophils Relative 1 %   Basophils Absolute 0.1 0.0 - 0.1 K/uL   Immature Granulocytes 1 %   Abs Immature Granulocytes 0.05 0.00 - 0.07 K/uL    Comment: Performed at [facility], [address] [place], [place] [address]  Comprehensive metabolic panel     Status: Abnormal   Collection Time: 04/08/23  6:19 PM  Result Value Ref Range   Sodium 136 135 - 145 mmol/L   Potassium 2.5 (LL) 3.5 - 5.1 mmol/L    Comment: CRITICAL RESULT CALLED TO, READ BACK BY AND VERIFIED WITH BRANCH,K RN 04/08/23 1854 AMIREHSANI F   Chloride 90 (L) 98 - 111 mmol/L   CO2 30 22 - 32 mmol/L   Glucose, Bld 142 (H) 70 - 99 mg/dL    Comment: Glucose reference range applies only to samples taken after fasting for at least 8 hours.   BUN 19 6 - 20 mg/dL   Creatinine, Ser 4.69 (H) 0.61 - 1.24 mg/dL   Calcium 8.9 8.9 - 62.9 mg/dL   Total Protein 7.1 6.5 - 8.1 g/dL   Albumin 3.3 (L) 3.5 - 5.0 g/dL   AST 28 15 - 41 U/L   ALT 29  0 - 44 U/L   Alkaline Phosphatase 128 (H) 38 - 126 U/L   Total Bilirubin 1.2 0.3 - 1.2 mg/dL   GFR, Estimated 44 (L) >60 mL/min    Comment: (NOTE) Calculated using the CKD-EPI Creatinine Equation (2021)    Anion gap 16 (H) 5 - 15    Comment: Performed at [facility], [address] [place], [place] [address]  Magnesium     Status: Abnormal   Collection Time: 04/08/23  6:19 PM  Result Value Ref Range   Magnesium 2.6 (H) 1.7 - 2.4 mg/dL    Comment: Performed at [facility], [address] [place], [place] [address]  Brain natriuretic peptide     Status: Abnormal   Collection Time: 04/08/23  6:20 PM  Result Value Ref Range   B Natriuretic Peptide 182.4 (H) 0.0 - 100.0 pg/mL    Comment: Performed at [facility], [address] [place], [place] [address]   DG Chest Port 1 View  Result Date: 04/08/2023 CLINICAL DATA:  syncope EXAM: PORTABLE CHEST 1 VIEW COMPARISON:  Chest x-ray 03/28/2023 FINDINGS: Enlarged cardiac silhouette. The heart and mediastinal contours are unchanged. Left chest wall triple lead pacemaker defibrillator. No focal consolidation. No pulmonary edema. No pleural effusion. No pneumothorax. No acute osseous abnormality. IMPRESSION: No active disease. Electronically Signed   By: Tish Frederickson M.D.   On: 04/08/2023 20:10    Pending Labs Unresulted Labs (From admission, onward)     Start     Ordered   04/09/23 0500  CBC with Differential/Platelet  Tomorrow morning,   R        04/08/23 2025   04/09/23 0500  Comprehensive metabolic panel  Tomorrow morning,   R        04/08/23 2025   04/09/23 0500  Magnesium  Tomorrow morning,   R        04/08/23 2025   04/09/23 0100  Basic metabolic panel  Once-Timed,   TIMED        04/08/23 2027            Vitals/Pain Today's Vitals   04/08/23 1900 04/08/23 1930 04/08/23 2000 04/08/23  2100  BP: 107/76 94/61 (!) 93/52 101/68  Pulse: 72 (!) 35 71 72  Resp: 14 18 (!) 22 (!) 25  Temp:      TempSrc:       SpO2: 98% 100% 100% 97%  Weight:      Height:      PainSc:        Isolation Precautions No active isolations  Medications Medications  potassium chloride 10 mEq in 100 mL IVPB (10 mEq Intravenous New Bag/Given 04/08/23 2123)  potassium chloride (KLOR-CON) packet 40 mEq (40 mEq Oral Given 04/08/23 2132)  acetaminophen (TYLENOL) tablet 650 mg (has no administration in time range)    Or  acetaminophen (TYLENOL) suppository 650 mg (has no administration in time range)  melatonin tablet 3 mg (has no administration in time range)  amiodarone (PACERONE) tablet 400 mg (has no administration in time range)  aspirin EC tablet 81 mg (has no administration in time range)  empagliflozin (JARDIANCE) tablet 10 mg (has no administration in time range)  gabapentin (NEURONTIN) capsule 100 mg (has no administration in time range)  mexiletine (MEXITIL) capsule 250 mg (250 mg Oral Given 04/08/23 2132)  pantoprazole (PROTONIX) EC tablet 40 mg (has no administration in time range)  ranolazine (RANEXA) 12 hr tablet 500 mg (500 mg Oral Given 04/08/23 2132)  rosuvastatin (CRESTOR) tablet 40 mg (has no administration in time range)  torsemide (DEMADEX) tablet 80 mg (has no administration in time range)    And  torsemide (DEMADEX) tablet 40 mg (40 mg Oral Given 04/08/23 2132)    Mobility walks     Focused Assessments Cardiac Assessment Handoff:    Lab Results  Component Value Date   CKTOTAL 65 03/29/2023   Lab Results  Component Value Date   DDIMER 0.29 03/29/2023   Does the Patient currently have chest pain? No    R Recommendations: See Admitting Provider Note  Report given to:   Additional Notes: Pt is A&O x4, Pt is being admitted for cardiac arrest with v-fib. Received 3 runs of K and PO K

## 2023-04-09 DIAGNOSIS — I5023 Acute on chronic systolic (congestive) heart failure: Secondary | ICD-10-CM | POA: Diagnosis not present

## 2023-04-09 DIAGNOSIS — I4901 Ventricular fibrillation: Secondary | ICD-10-CM | POA: Diagnosis not present

## 2023-04-09 LAB — CBC WITH DIFFERENTIAL/PLATELET
Abs Immature Granulocytes: 0.04 10*3/uL (ref 0.00–0.07)
Basophils Absolute: 0.1 10*3/uL (ref 0.0–0.1)
Basophils Relative: 1 %
Eosinophils Absolute: 0.3 10*3/uL (ref 0.0–0.5)
Eosinophils Relative: 4 %
HCT: 39.8 % (ref 39.0–52.0)
Hemoglobin: 13.5 g/dL (ref 13.0–17.0)
Immature Granulocytes: 1 %
Lymphocytes Relative: 14 %
Lymphs Abs: 1 10*3/uL (ref 0.7–4.0)
MCH: 30.9 pg (ref 26.0–34.0)
MCHC: 33.9 g/dL (ref 30.0–36.0)
MCV: 91.1 fL (ref 80.0–100.0)
Monocytes Absolute: 0.7 10*3/uL (ref 0.1–1.0)
Monocytes Relative: 10 %
Neutro Abs: 5.1 10*3/uL (ref 1.7–7.7)
Neutrophils Relative %: 70 %
Platelets: 228 10*3/uL (ref 150–400)
RBC: 4.37 MIL/uL (ref 4.22–5.81)
RDW: 14.2 % (ref 11.5–15.5)
WBC: 7.2 10*3/uL (ref 4.0–10.5)
nRBC: 0 % (ref 0.0–0.2)

## 2023-04-09 LAB — URINALYSIS, COMPLETE (UACMP) WITH MICROSCOPIC
Bacteria, UA: NONE SEEN
Bilirubin Urine: NEGATIVE
Glucose, UA: >=500 mg/dL — AB
Hgb urine dipstick: NEGATIVE
Ketones, ur: NEGATIVE mg/dL
Leukocytes,Ua: NEGATIVE
Nitrite: NEGATIVE
Protein, ur: NEGATIVE mg/dL
Specific Gravity, Urine: 1.014 (ref 1.005–1.030)
pH: 7 (ref 5.0–8.0)

## 2023-04-09 LAB — BASIC METABOLIC PANEL
Anion gap: 11 (ref 5–15)
Anion gap: 11 (ref 5–15)
BUN: 18 mg/dL (ref 6–20)
BUN: 20 mg/dL (ref 6–20)
CO2: 33 mmol/L — ABNORMAL HIGH (ref 22–32)
CO2: 30 mmol/L (ref 22–32)
Calcium: 8.7 mg/dL — ABNORMAL LOW (ref 8.9–10.3)
Calcium: 8.6 mg/dL — ABNORMAL LOW (ref 8.9–10.3)
Chloride: 93 mmol/L — ABNORMAL LOW (ref 98–111)
Chloride: 96 mmol/L — ABNORMAL LOW (ref 98–111)
Creatinine, Ser: 1.93 mg/dL — ABNORMAL HIGH (ref 0.61–1.24)
Creatinine, Ser: 1.87 mg/dL — ABNORMAL HIGH (ref 0.61–1.24)
GFR, Estimated: 41 mL/min — ABNORMAL LOW (ref >60)
GFR, Estimated: 43 mL/min — ABNORMAL LOW (ref >60)
Glucose, Bld: 144 mg/dL — ABNORMAL HIGH (ref 70–99)
Glucose, Bld: 162 mg/dL — ABNORMAL HIGH (ref 70–99)
Potassium: 3 mmol/L — ABNORMAL LOW (ref 3.5–5.1)
Potassium: 3.7 mmol/L (ref 3.5–5.1)
Sodium: 137 mmol/L (ref 135–145)
Sodium: 137 mmol/L (ref 135–145)

## 2023-04-09 LAB — COMPREHENSIVE METABOLIC PANEL
ALT: 25 U/L (ref 0–44)
AST: 24 U/L (ref 15–41)
Albumin: 3.1 g/dL — ABNORMAL LOW (ref 3.5–5.0)
Alkaline Phosphatase: 114 U/L (ref 38–126)
Anion gap: 13 (ref 5–15)
BUN: 18 mg/dL (ref 6–20)
CO2: 33 mmol/L — ABNORMAL HIGH (ref 22–32)
Calcium: 9.5 mg/dL (ref 8.9–10.3)
Chloride: 92 mmol/L — ABNORMAL LOW (ref 98–111)
Creatinine, Ser: 1.91 mg/dL — ABNORMAL HIGH (ref 0.61–1.24)
GFR, Estimated: 42 mL/min — ABNORMAL LOW (ref >60)
Glucose, Bld: 159 mg/dL — ABNORMAL HIGH (ref 70–99)
Potassium: 2.7 mmol/L — CL (ref 3.5–5.1)
Sodium: 138 mmol/L (ref 135–145)
Total Bilirubin: 1.2 mg/dL (ref 0.3–1.2)
Total Protein: 6.6 g/dL (ref 6.5–8.1)

## 2023-04-09 LAB — MAGNESIUM: Magnesium: 2.7 mg/dL — ABNORMAL HIGH (ref 1.7–2.4)

## 2023-04-09 LAB — GLUCOSE, CAPILLARY
Glucose-Capillary: 158 mg/dL — ABNORMAL HIGH (ref 70–99)
Glucose-Capillary: 176 mg/dL — ABNORMAL HIGH (ref 70–99)
Glucose-Capillary: 194 mg/dL — ABNORMAL HIGH (ref 70–99)
Glucose-Capillary: 205 mg/dL — ABNORMAL HIGH (ref 70–99)

## 2023-04-09 LAB — SODIUM, URINE, RANDOM: Sodium, Ur: 58 mmol/L

## 2023-04-09 LAB — CREATININE, URINE, RANDOM: Creatinine, Urine: 43 mg/dL

## 2023-04-09 MED ORDER — POTASSIUM CHLORIDE 20 MEQ PO PACK
40.0000 meq | PACK | Freq: Two times a day (BID) | ORAL | Status: AC
  Administered 2023-04-09: 40 meq via ORAL
  Filled 2023-04-09: qty 2

## 2023-04-09 MED ORDER — SENNA 8.6 MG PO TABS
1.0000 | ORAL_TABLET | Freq: Every day | ORAL | Status: DC | PRN
  Administered 2023-04-09: 8.6 mg via ORAL
  Filled 2023-04-09: qty 1

## 2023-04-09 MED ORDER — ALLOPURINOL 100 MG PO TABS
100.0000 mg | ORAL_TABLET | Freq: Every day | ORAL | Status: DC
  Administered 2023-04-09 – 2023-04-12 (×4): 100 mg via ORAL
  Filled 2023-04-09 (×4): qty 1

## 2023-04-09 MED ORDER — POTASSIUM CHLORIDE 10 MEQ/100ML IV SOLN
10.0000 meq | INTRAVENOUS | Status: AC
  Administered 2023-04-09 (×2): 10 meq via INTRAVENOUS
  Filled 2023-04-09 (×2): qty 100

## 2023-04-09 MED ORDER — MAGNESIUM OXIDE -MG SUPPLEMENT 400 (240 MG) MG PO TABS
400.0000 mg | ORAL_TABLET | Freq: Every day | ORAL | Status: DC
  Administered 2023-04-09 – 2023-04-12 (×4): 400 mg via ORAL
  Filled 2023-04-09 (×4): qty 1

## 2023-04-09 MED ORDER — POTASSIUM CHLORIDE CRYS ER 20 MEQ PO TBCR
40.0000 meq | EXTENDED_RELEASE_TABLET | Freq: Once | ORAL | Status: AC
  Administered 2023-04-09: 40 meq via ORAL
  Filled 2023-04-09: qty 2

## 2023-04-09 MED ORDER — POTASSIUM CHLORIDE CRYS ER 20 MEQ PO TBCR: 40.0000 meq | EXTENDED_RELEASE_TABLET | Freq: Once | ORAL | Status: DC

## 2023-04-09 MED ORDER — FUROSEMIDE 10 MG/ML IJ SOLN
80.0000 mg | Freq: Once | INTRAMUSCULAR | Status: AC
  Administered 2023-04-09: 80 mg via INTRAVENOUS
  Filled 2023-04-09: qty 8

## 2023-04-09 NOTE — Progress Notes (Signed)
Pt stated he has not been wearing CPAP at home.  Doesn't want to wear CPAP tonight.

## 2023-04-09 NOTE — Progress Notes (Signed)
Orthopedic Tech Progress Note Patient Details:  Joe [name] [facility] 21-Oct-1970 [id number]  Ortho Devices Type of Ortho Device: Radio broadcast assistant Ortho Device/Splint Location: BLE Ortho Device/Splint Interventions: Ordered, Adjustment, Application   Post Interventions Patient Tolerated: Well  Joe [name] A Joe [name] 04/09/2023, 7:57 PM

## 2023-04-09 NOTE — TOC CM/SW Note (Signed)
Transition of Care [facility]) - Inpatient Brief Assessment   Patient Details  Name: Joe [name] MRN: [id number] Date of Birth: 1971-03-15  Transition of Care [facility]) CM/SW Contact:    Gala Lewandowsky, RN Phone Number: 04/09/2023, 11:54 AM   Clinical Narrative: Patient presented for loss of consciousness. PTA patient was independent from home with spouse. Patient states he was supposed to have a CPAP at home; however, he does not have the DME. Heart Failure Case Manager will continue to follow for transition of care needs.    Transition of Care Asessment: Insurance and Status: Insurance coverage has been reviewed Patient has primary care physician: Yes Home environment has been reviewed: reviewed Prior level of function:: independent Prior/Current Home Services: No current home services Social Determinants of Health Reivew: SDOH reviewed no interventions necessary Readmission risk has been reviewed: Yes Transition of care needs: no transition of care needs at this time

## 2023-04-09 NOTE — Plan of Care (Signed)
  Problem: Education: Goal: Knowledge of General Education information will improve Description Including pain rating scale, medication(s)/side effects and non-pharmacologic comfort measures Outcome: Progressing   

## 2023-04-09 NOTE — Consult Note (Signed)
Advanced Heart Failure Team Consult Note   Primary Physician: Lonie Peak, PA-C PCP-Cardiologist:  [name], MD  Reason for Consultation: acute on chronic systolic heart failure, medication management post VT treated w/ ICD shock   HPI:    Joe [name] is seen today for evaluation of acute on chronic systolic heart failure, medication management post VT treated w/ ICD shock, at the request of Dr. Elberta Fortis, Electrophysiology.   Joe [name] is a 52 y.o. male with h/o morbid obesity, DM2, HTN, HL, OSA, CAD and systolic HF (EF 20-25%).   Has h/o RBBB but never had any other heart problems until 3/19. Saw Dr. Mauri Brooklyn in 3/19 due to CP and SOB. Cath EF 25-35% severe diabetic CAD with LAD disease (m90 and m75),  RCA d50, LCA m50.    Echo 7/20 EF 20-25% moderate RV dysfunction   Cath January 2020 which showed 40% distal LAD, LAD/LCx stents were patent, mid RCA got 25% lesion right PDA had 50% stenosis the circumflex artery were open.  No intervention has been required.    Had cath 1/22 with stable CAD   Myoview 12/19 EF 30% Apical and infero-apical scar   Had St. Jude CRT-D placed 03/19/19 with Dr. Elberta Fortis.   Admitted 9/22 for CP. HsTn was 4. Underwent cardiac cath on 03/07/2021 which showed patent stents in the LAD and LCX with diffuse distal LAD which may be a trigger for angina as well as mildly elevated RA pressure with normal PCWP and LVEDP and preserved cardiac output.    Echo 9/22 EF 35-40%   Admitted 6/23 with CP. Hstrop 35. Felt to be non-cardiac. Echo EF 30-35%   Seen in ED 04/10/22 with SOB, found to be volume overloaded. BNP only 172. Hst trop 37->47. CXR with pulmonary edema. Given IV lasix with > 2L out. Refused hospital admission, had been in [facility] 2-3 x to get fluids back.   He's had recurrent volume overload over the last few months requiring frequent diuretic adjustments. NYHA IIIb/IV.    Seen for follow-up on 02/16/23. He was markedly  volume overloaded. ReDS 47%. Declined admission. Torsemide increased to 80 mg daily and instructed to take 5 mg metolazone daily X 3 days. Coreg stopped. Echo with EF 20-25%. Close follow up 9/24, down 33 lbs but still with volume overload and NYHA IIIb symptoms. Discussion had about work up for advanced therapies. Given age favor transplant eval but BMI may be prohibitive currently, and VAD may be better option. Has been referred to [facility] for transplant evaluation.    Underwent R/LHC (9/24) showing stable 3v CAD, severe biventricular failure with elevated filling pressures and very low PAPi suggestive of poor RV function. Not VAD candidate with poor RV function. Torsemide increased to 40 mg bid, and weekly metolazone added.  Recently admitted 10/10 - 10/13 for ICD shock in setting of VF. Loaded on IV amiodarone.   Seen at [facility] on 10/14 for transplant evaluation. CXP was done. Plan, per pt report, is for [facility] to discuss candidacy later this week at meeting on Thursday.   Pt unfortunately has been readmitted for VT treated w/ ICD shock. He apparently briefly lost consciousness and had brief chest compressions by his brother-in law. Regained consciousness after his ICD fired twice.  This is in the setting of hypokalemia. K 2.5 on admission. Mg was ok at 2.6. BNP 182, fairly consistent w/ baseline but also likely falsely low in setting of obesity. Labs showed AKI w/ SCr elevated at 1.83  on admit, up to 1.9 today (b/l ~1.2). Pt says he is ~15 lb above baseline dry wt (299 lb). Noticed wt increase over the weekend w/ decreased urinary response to diuretics. Of note, he had not been taking extra KCl on metolazone days.    AHF team asked to assist w/ HF management and GDMT.  Wife and other family present at bedside. He denies any current resting dyspnea, but family say they think he sounds more dyspneic w/ conversation. Reports NYHA Class II symptoms. No chest pain. SBPs low 100s-110s. CXR showed no frank  pulmonary edema. Device interrogation shows impedence right at reference curve.     Echo 8/24 1. Left ventricular ejection fraction, by estimation, is <20%. The left  ventricle has severely decreased function. The left ventricle demonstrates  global hypokinesis. The left ventricular internal cavity size was severely  dilated. Left ventricular  diastolic parameters are indeterminate.   2. Right ventricular systolic function is normal. The right ventricular  size is normal.   3. Right atrial size was mildly dilated.   4. The mitral valve is normal in structure. Mild mitral valve  regurgitation. No evidence of mitral stenosis.   5. The aortic valve is normal in structure. Aortic valve regurgitation is  not visualized. No aortic stenosis is present.   6. The inferior vena cava is normal in size with greater than 50%  respiratory variability, suggesting right atrial pressure of 3 mmHg.    Review of Systems: [y] = yes, [ ]  = no   General: Weight gain [ ] ; Weight loss [ ] ; Anorexia [ ] ; Fatigue [ ] ; Fever [ ] ; Chills [ ] ; Weakness [ ]   Cardiac: Chest pain/pressure [ ] ; Resting SOB [ ] ; Exertional SOB [ ] ; Orthopnea [ ] ; Pedal Edema [ ] ; Palpitations [ ] ; Syncope [ ] ; Presyncope [ ] ; Paroxysmal nocturnal dyspnea[ ]   Pulmonary: Cough [ ] ; Wheezing[ ] ; Hemoptysis[ ] ; Sputum [ ] ; Snoring [ ]   GI: Vomiting[ ] ; Dysphagia[ ] ; Melena[ ] ; Hematochezia [ ] ; Heartburn[ ] ; Abdominal pain [ ] ; Constipation [ ] ; Diarrhea [ ] ; BRBPR [ ]   GU: Hematuria[ ] ; Dysuria [ ] ; Nocturia[ ]   Vascular: Pain in legs with walking [ ] ; Pain in feet with lying flat [ ] ; Non-healing sores [ ] ; Stroke [ ] ; TIA [ ] ; Slurred speech [ ] ;  Neuro: Headaches[ ] ; Vertigo[ ] ; Seizures[ ] ; Paresthesias[ ] ;Blurred vision [ ] ; Diplopia [ ] ; Vision changes [ ]   Ortho/Skin: Arthritis [ ] ; Joint pain [ ] ; Muscle pain [ ] ; Joint swelling [ ] ; Back Pain [ ] ; Rash [ ]   Psych: Depression[ ] ; Anxiety[ ]   Heme: Bleeding problems [ ] ; Clotting  disorders [ ] ; Anemia [ ]   Endocrine: Diabetes [ ] ; Thyroid dysfunction[ ]   Home Medications Prior to Admission medications   Medication Sig Start Date End Date Taking? Authorizing Provider  acetaminophen (TYLENOL) 500 MG tablet Take 1,000 mg by mouth every 6 (six) hours as needed for moderate pain or headache.   Yes [provider]  allopurinol (ZYLOPRIM) 100 MG tablet Take 100 mg by mouth daily.   Yes [provider]  amiodarone (PACERONE) 400 MG tablet Tablet 1 tablet twice a day for 7 days, then 1 tablet daily thereafter Patient taking differently: Take 400 mg by mouth daily. 04/01/23  Yes Meng, Wynema Birch, PA  Aromatic Inhalants (VICKS VAPOINHALER) INHA Inhale 1 puff into the lungs daily as needed (congestion).   Yes [provider]  aspirin EC 81 MG tablet  Take 1 tablet (81 mg total) by mouth daily. 09/05/17  Yes Georgeanna Lea, MD  colchicine 0.6 MG tablet Take 0.6 mg by mouth daily as needed (Gout). 05/10/20  Yes [provider]  diclofenac Sodium (VOLTAREN) 1 % GEL APPLY 1 G TOPICALLY DAILY. Patient taking differently: Apply 2 g topically daily as needed (for pain). 01/11/23  Yes Standiford, Jenelle Mages, DPM  empagliflozin (JARDIANCE) 10 MG TABS tablet Take 1 tablet (10 mg total) by mouth daily before breakfast. 02/23/23  Yes Andrey Farmer, PA-C  escitalopram (LEXAPRO) 10 MG tablet Take 10 mg by mouth at bedtime. 06/24/21  Yes [provider]  gabapentin (NEURONTIN) 100 MG capsule Take 100-200 mg by mouth 2 (two) times daily. 11/09/21  Yes [provider]  ibuprofen (ADVIL) 200 MG tablet Take 800 mg by mouth every 8 (eight) hours as needed for headache or moderate pain.   Yes [provider]  metolazone (ZAROXOLYN) 2.5 MG tablet Take 1 tablet (2.5 mg total) by mouth daily. Take one tablet (2.5 mg) once weekly.  Take extra potassium (20 meq) when  you take metolazone Patient taking differently: Take 2.5 mg by mouth See admin  instructions. Take one tablet (2.5 mg) once weekly on Saturday.  May take 1 tablet during the week for weight gain. Take extra potassium (20 meq) when  you take metolazone 02/23/23  Yes Bensimhon, Bevelyn Buckles, MD  mexiletine (MEXITIL) 250 MG capsule Take 1 capsule (250 mg total) by mouth 2 (two) times daily. 12/18/22  Yes Camnitz, Andree Coss, MD  nitroGLYCERIN (NITROSTAT) 0.4 MG SL tablet Place 1 tablet (0.4 mg total) under the tongue every 5 (five) minutes as needed for chest pain. 03/16/23  Yes Milford, Anderson Malta, FNP  omeprazole (PRILOSEC) 40 MG capsule Take 1 capsule (40 mg total) by mouth daily. 01/14/19  Yes Georgeanna Lea, MD  OZEMPIC, 1 MG/DOSE, 4 MG/3ML SOPN Inject 1 mg into the skin every Monday. 12/28/22  Yes [provider]  Polyethylene Glycol 400 (BLINK TEARS OP) Place 2 drops into both eyes 4 (four) times daily as needed (for dry eyes).   Yes [provider]  potassium chloride SA (KLOR-CON M) 20 MEQ tablet Take 2 tablets (40 mEq total) by mouth every morning AND 1 tablet (20 mEq total) every evening. On days you take metolazone take 40 meq of potassium in the morning and 40 in the evening.. Patient taking differently: Take 2 tablets (40 mEq total) by mouth every morning AND 2 tablet (40 mEq total) every evening. On days you take metolazone take 20-40 meq of potassium in the morning and 20-40 in the evening.. 02/23/23  Yes Bensimhon, Bevelyn Buckles, MD  ranolazine (RANEXA) 500 MG 12 hr tablet Take 1 tablet (500 mg total) by mouth 2 (two) times daily. 07/18/22  Yes Georgeanna Lea, MD  rosuvastatin (CRESTOR) 40 MG tablet Take 1 tablet (40 mg total) by mouth daily. Patient taking differently: Take 40 mg by mouth at bedtime. 07/18/22  Yes Georgeanna Lea, MD  torsemide (DEMADEX) 20 MG tablet Take 2 tablets (40 mg total) by mouth every morning AND 2 tablets (40 mg total) every evening. 02/23/23 05/24/23 Yes Bensimhon, Bevelyn Buckles, MD  triamcinolone cream (KENALOG) 0.1 % Apply 1  Application topically 2 (two) times daily. 10/09/22  Yes [provider]    Past Medical History: Past Medical History:  Diagnosis Date   Abnormal EKG    Acute systolic heart failure (HCC) 09/06/2017   Angina  pectoris (HCC) 09/05/2017   Body mass index 45.0-49.9, adult (HCC)    CAD S/P percutaneous coronary angioplasty 09/07/2017   mLAD and dLAD PCI with DES 09/06/17   CHF (congestive heart failure) (HCC)    Chronic systolic (congestive) heart failure (HCC) 03/19/2019   Complication of anesthesia 1995   "had hard time waking up; breathing w/vasectomy"   Coronary artery disease    Erectile dysfunction    Essential hypertension, benign    Fatigue    GERD (gastroesophageal reflux disease)    Gout    High cholesterol    "just started tx yesterday" (09/06/2017)   History of gout    "RX prn" (09/06/2017)   Ischemic cardiomyopathy 09/07/2017   EF 25-35%   Muscular chest pain    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   Below Left Nare (MOH's)   Obesity    Obstructive sleep apnea    OSA on CPAP    Plantar fasciitis    Pre-operative clearance 02/11/2018   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/02/2019   Saint Jude   Right bundle branch block    Shortness of breath 09/05/2017   Testosterone deficiency    Type 2 diabetes mellitus with complication, without long-term current use of insulin (HCC) 09/05/2017   Type II diabetes mellitus (HCC)    "started tx 08/28/2017"    Past Surgical History: Past Surgical History:  Procedure Laterality Date   BACK SURGERY     BIV ICD INSERTION CRT-D N/A 03/19/2019   Procedure: BIV ICD INSERTION CRT-D;  Surgeon: Regan Lemming, MD;  Location[facility] INVASIVE CV LAB;  Service: Cardiovascular;  Laterality: N/A;   CORONARY ANGIOPLASTY WITH STENT PLACEMENT  09/06/2017   "3 stents"   CORONARY STENT INTERVENTION N/A 09/06/2017   Procedure: CORONARY STENT INTERVENTION;  Surgeon: Corky Crafts, MD;  Location:  [facility] INVASIVE CV LAB;  Service: Cardiovascular;  Laterality: N/A;   CORONARY STENT INTERVENTION N/A 02/15/2018   Procedure: CORONARY STENT INTERVENTION;  Surgeon: Corky Crafts, MD;  Location: [facility] INVASIVE CV LAB;  Service: Cardiovascular;  Laterality: N/A;   KNEE ARTHROSCOPY Right    LEFT HEART CATH AND CORONARY ANGIOGRAPHY N/A 09/06/2017   Procedure: LEFT HEART CATH AND CORONARY ANGIOGRAPHY;  Surgeon: Corky Crafts, MD;  Location: [facility] INVASIVE CV LAB;  Service: Cardiovascular;  Laterality: N/A;   LEFT HEART CATH AND CORONARY ANGIOGRAPHY N/A 02/15/2018   Procedure: LEFT HEART CATH AND CORONARY ANGIOGRAPHY;  Surgeon: Corky Crafts, MD;  Location: [facility] INVASIVE CV LAB;  Service: Cardiovascular;  Laterality: N/A;   LEFT HEART CATH AND CORONARY ANGIOGRAPHY N/A 06/27/2018   Procedure: LEFT HEART CATH AND CORONARY ANGIOGRAPHY;  Surgeon: Corky Crafts, MD;  Location: [facility] INVASIVE CV LAB;  Service: Cardiovascular;  Laterality: N/A;   LEFT HEART CATH AND CORONARY ANGIOGRAPHY N/A 06/28/2020   Procedure: LEFT HEART CATH AND CORONARY ANGIOGRAPHY;  Surgeon: Swaziland, Peter M, MD;  Location: [facility] INVASIVE CV LAB;  Service: Cardiovascular;  Laterality: N/A;   LUMBAR SPINE SURGERY  ~ 2001   Intradiscal Electrothermoplasty   RIGHT/LEFT HEART CATH AND CORONARY ANGIOGRAPHY N/A 03/07/2021   Procedure: RIGHT/LEFT HEART CATH AND CORONARY ANGIOGRAPHY;  Surgeon: Laurey Morale, MD;  Location: [facility] INVASIVE CV LAB;  Service: Cardiovascular;  Laterality: N/A;   RIGHT/LEFT HEART CATH AND CORONARY ANGIOGRAPHY N/A 02/23/2023   Procedure: RIGHT/LEFT HEART CATH AND CORONARY ANGIOGRAPHY;  Surgeon: Dolores Patty, MD;  Location: [facility] INVASIVE CV LAB;  Service: Cardiovascular;  Laterality: N/A;  VASECTOMY  1995    Family History: Family History  Problem Relation Age of Onset   Lung cancer Mother    Brain cancer Mother    Lymphoma Father    Heart disease Brother    Heart attack Brother    Diabetes Mellitus II  Brother    Diabetes Mellitus II Paternal Grandfather     Social History: Social History   Socioeconomic History   Marital status: Married    Spouse name: Not on file   Number of children: Not on file   Years of education: Not on file   Highest education level: Not on file  Occupational History   Not on file  Tobacco Use   Smoking status: Never   Smokeless tobacco: Never  Vaping Use   Vaping status: Never Used  Substance and Sexual Activity   Alcohol use: No   Drug use: Never   Sexual activity: Not on file  Other Topics Concern   Not on file  Social History Narrative   Not on file   Social Determinants of Health   Financial Resource Strain: Not on file  Food Insecurity: No Food Insecurity (04/08/2023)   Hunger Vital Sign    Worried About Running Out of Food in the Last Year: Never true    Ran Out of Food in the Last Year: Never true  Transportation Needs: No Transportation Needs (04/08/2023)   PRAPARE - Administrator, Civil Service (Medical): No    Lack of Transportation (Non-Medical): No  Physical Activity: Not on file  Stress: Not on file  Social Connections: Unknown (10/28/2021)   [facility]   Social Network    Social Network: Not on file    Allergies:  No Known Allergies  Objective:    Vital Signs:   Temp:  [97.7 F (36.5 C)-98.5 F (36.9 C)] 98.2 F (36.8 C) (10/21 1143) Pulse Rate:  [35-73] 71 (10/21 1143) Resp:  [10-25] 20 (10/21 1143) BP: (93-116)/(52-81) 108/71 (10/21 1143) SpO2:  [94 %-100 %] 96 % (10/21 1143) Weight:  [142.7 kg-142.9 kg] 142.7 kg (10/20 2245)    Weight change: Filed Weights   04/08/23 1809 04/08/23 2245  Weight: (!) 142.9 kg (!) 142.7 kg    Intake/Output:   Intake/Output Summary (Last 24 hours) at 04/09/2023 1155 Last data filed at 04/09/2023 0744 Gross per 24 hour  Intake 437 ml  Output 1400 ml  Net -963 ml      Physical Exam    General:  Well appearing, morbidly  obese. No resp difficulty HEENT: normal Neck: supple. Thick neck, unable to visualize JVD Carotids 2+ bilat; no bruits. No lymphadenopathy or thyromegaly appreciated. Cor: PMI nondisplaced. Regular rate & rhythm. No rubs, gallops or murmurs. Lungs: decreased BS at the bases bilaterally  Abdomen: obese, +distended, nontender, nondistended. No hepatosplenomegaly. No bruits or masses. Good bowel sounds. Extremities: no cyanosis, clubbing, rash, 1+ b/l LE edema w/ chronic venous stasis dermatitis  Neuro: alert & orientedx3, cranial nerves grossly intact. moves all 4 extremities w/o difficulty. Affect pleasant   Telemetry   V paced 70s, occasional PVCs, personally reviewed   EKG    V paced 70s, personally reviewed   Labs   Basic Metabolic Panel: Recent Labs  Lab 04/08/23 1819 04/09/23 0049 04/09/23 0433  NA 136 137 138  K 2.5* 3.0* 2.7*  CL 90* 93* 92*  CO2 30 33* 33*  GLUCOSE 142* 144* 159*  BUN 19 18 18   CREATININE  1.83* 1.93* 1.91*  CALCIUM 8.9 8.7* 9.5  MG 2.6*  --  2.7*    Liver Function Tests: Recent Labs  Lab 04/08/23 1819 04/09/23 0433  AST 28 24  ALT 29 25  ALKPHOS 128* 114  BILITOT 1.2 1.2  PROT 7.1 6.6  ALBUMIN 3.3* 3.1*   No results for input(s): "LIPASE", "AMYLASE" in the last 168 hours. No results for input(s): "AMMONIA" in the last 168 hours.  CBC: Recent Labs  Lab 04/08/23 1819 04/09/23 0433  WBC 7.8 7.2  NEUTROABS 5.5 5.1  HGB 14.0 13.5  HCT 39.7 39.8  MCV 89.0 91.1  PLT 229 228    Cardiac Enzymes: No results for input(s): "CKTOTAL", "CKMB", "CKMBINDEX", "TROPONINI" in the last 168 hours.  BNP: BNP (last 3 results) Recent Labs    03/09/23 1243 03/28/23 2205 04/08/23 1820  BNP 221.8* 178.3* 182.4*    ProBNP (last 3 results) Recent Labs    05/10/22 0905 08/10/22 1534  PROBNP 475* 351*     CBG: Recent Labs  Lab 04/08/23 2249 04/09/23 0736  GLUCAP 187* 158*    Coagulation Studies: No results for input(s):  "LABPROT", "INR" in the last 72 hours.   Imaging   DG Chest Port 1 View  Result Date: 04/08/2023 CLINICAL DATA:  syncope EXAM: PORTABLE CHEST 1 VIEW COMPARISON:  Chest x-ray 03/28/2023 FINDINGS: Enlarged cardiac silhouette. The heart and mediastinal contours are unchanged. Left chest wall triple lead pacemaker defibrillator. No focal consolidation. No pulmonary edema. No pleural effusion. No pneumothorax. No acute osseous abnormality. IMPRESSION: No active disease. Electronically Signed   By: Tish Frederickson M.D.   On: 04/08/2023 20:10     Medications:     Current Medications:  amiodarone  400 mg Oral Daily   aspirin EC  81 mg Oral Daily   empagliflozin  10 mg Oral QAC breakfast   insulin aspart  0-9 Units Subcutaneous TID WC   magnesium oxide  400 mg Oral Daily   mexiletine  250 mg Oral BID   pantoprazole  40 mg Oral Daily   potassium chloride  40 mEq Oral BID   ranolazine  500 mg Oral BID   rosuvastatin  40 mg Oral Daily   torsemide  80 mg Oral q morning   And   torsemide  40 mg Oral QHS    Infusions:     Patient Profile   52 y.o. male with h/o morbid obesity, DM2, HTN, HL, OSA, CAD and systolic HF (EF 20-25%).  Assessment/Plan   1. VT Arrest  - recurrent episodes, 2nd admit in the last 2 wks - admitted 10/10-10/13 w/ VF treated w/ ICD shock>>loaded w/ IV amio  - readmitted now w/ VT arrest, treated w/ appropriate ICD shock x 2 + brief bystander CPR  - in setting of underling CM w/ EF < 35% + hypokalemia (K 2.5)   - aggressive K supp. Keep K > 4.0 and Mg > 2.0 (2.6 on admit) - PO amio per EP, 400 mg bid  - Mexiletine 250 mg bid   2 Acute on Chronic Systolic HF - due to iCM - Echo (8/20): EF 20-25%  - Echo (9/22): 35-40% - Echo (6/23): EF 30-35% - Echo (8/24): EF 20-25%, RV ok. - R/LHC (9/24): stable 3v CAD, elevated biventricular filling pressures with low PAPi; CI 2.2 - He appears to have end-stage HF. With poor RV function, not a VAD candidate. BMI may  prohibit transplant consideration. He has been referred to [facility] for transplant  work up. Was seen by Dr. Edwena Blow on 10/14. Plan is to further discuss candidacy w/ transplant team later this week  - NYHA Class II-IIb. Volume assessment difficult given body habitus/obesity but he has peripheral edema on exam and reports 15 lb wt gain over the last several days + reduced urinary response to home diuretic regimen. CXR shows no frank pulmonary edema and device impedence has been drifting down and right at reference curve, signalling likely fluid accumulation. He was noted to have low PAPi on recent RHC c/w significant RV failure, thus explaining R>L sided symptoms. He has an AKI w/ SCr elevated at 1.9 (b/l 1.2), this is likely 2/2 renal venous congestion from volume overload +/- possible transient hypotension w/ brief cardiac arrest - He needs diuresis. Hold torsemide and treat w/ IV Lasix 80 mg bid + aggressive K supp.  - once back on oral regimen, he will need to take extra K supp on metolazone days  - Hold Entresto and Cleda Daub for now w/ AKI and soft BP  - Continue Jardiance 10 mg daily. - follow BMP daily    3. CAD with chronic stable angina - s/p multiple stents to LAD and LCX - Cath 9/22 stable CAD - LHC (9/24) with stable 3v CAD - denies CP  - Continue ASA. - Imdur held on admit w/ soft BP  - Continue Ranexa 500 mg bid.  - On high-intensity statin.  4. AKI  - SCr elevated at 1.9 (b/l 1.2), this is likely 2/2 renal venous congestion from volume overload +/- possible transient hypotension w/ brief cardiac arrest - diuresis per above - avoid hypotension, limited HF GDMT for now - follow BMP    5. DM2 - A1c 7.2 (1/24) - Continue SGLT2i   6. OSA - Not using CPAP, says machine was recalled a while ago - Follows with Dr. Mayford Knife - will need updated sleep study for new CPAP    7. Obesity - Body mass index is 38.75 kg/m. - Has struggled with weight loss - Continue Ozempic, may have better  success with tirzepatide.   8. Frequent PVCs - On mexilitene 250 mg bid - Zio 5/22 PVC burden 5.4%.  - Followed by Dr. Elberta Fortis - Continue current management - keep K > 4.0 and Mg > 2.0    Length of Stay: 1  Joe Kenna, PA-C  04/09/2023, 11:55 AM  Advanced Heart Failure Team Pager [phone] (M-F; 7a - 5p)  Please contact [facility] for night-coverage after hours (4p -7a ) and weekends on [email or web address]

## 2023-04-09 NOTE — Progress Notes (Signed)
PROGRESS NOTE    Joe [name]  ZDG:[id number] DOB: [birth year] DOA: 04/08/2023 PCP: Joe Peak, PA-C  Chief Complaint  Patient presents with   Pacemaker Problem    Brief Narrative:   Joe [name] is Joe [name] 52 y.o. male with medical history significant for chronic systolic heart failure in setting of ischemic cardiomyopathy, V-fib arrest, hyperlipidemia, obstructive sleep apnea on home nocturnal CPAP, type 2 diabetes mellitus, who is admitted to [facility] on 04/08/2023 with V-fib arrest status post ROSC after presenting from home to [facility] ED complaining of cardiac arrest.   Assessment & Plan:   Principal Problem:   Ventricular fibrillation (HCC) Active Problems:   Obstructive sleep apnea   GERD (gastroesophageal reflux disease)   Chronic systolic heart failure (HCC)   Hyperlipidemia   DM2 (diabetes mellitus, type 2) (HCC)   Hypokalemia   Cardiac arrest (HCC)   HFrEF (heart failure with reduced ejection fraction) (HCC)   AKI (acute kidney injury) (HCC)  V Fib Arrest  amiodarone Per EP, appreciate assistance  Ischemic Cardiomyopathy Diuresis per cards Jardiance Concern regarding worsening renal function and diuretic resistance -> they're planning to discuss case with [facility]   Hypokalemia Due to diuretics/metolazone.  Significantly low, aggressive repletion in setting of vfib arrest.  Acute Kidney Injury Baseline creatinine appears to be 1.32 1.8 on presentation Follow with diuresis per cards  Subclinical Hypothyroidism Repeat labs in 4-6 weeks  OSA Cpap  T2DM Jardiance SSI  Gout Allopurinol  GERD PPI      DVT prophylaxis: SCD Code Status: full Family Communication: wife Disposition:   Status is: Inpatient Remains inpatient appropriate because: need for inpatient care   Consultants:  Cards   Procedures:  none  Antimicrobials:  Anti-infectives (From admission, onward)    None       Subjective: Feels  ok now Many family members at bedside, wife, sister, brother, brother in law  Objective: Vitals:   04/09/23 0321 04/09/23 0739 04/09/23 1143 04/09/23 1610  BP: 100/71 108/75 108/71 99/63  Pulse: 66 67 71 73  Resp: 18 18 20 20   Temp: 97.7 F (36.5 C) 98.3 F (36.8 C) 98.2 F (36.8 C) 98.5 F (36.9 C)  TempSrc: Oral Oral Oral Oral  SpO2: 94% 94% 96% 96%  Weight:      Height:        Intake/Output Summary (Last 24 hours) at 04/09/2023 1821 Last data filed at 04/09/2023 1658 Gross per 24 hour  Intake 615.75 ml  Output 1400 ml  Net -784.25 ml   Filed Weights   04/08/23 1809 04/08/23 2245  Weight: (!) 142.9 kg (!) 142.7 kg    Examination:  General exam: Appears calm and comfortable  Respiratory system: diminished Cardiovascular system: RRR Central nervous system: Alert and oriented. No focal neurological deficits. Extremities: bilateral LE edema, 1+   Data Reviewed: I have personally reviewed following labs and imaging studies  CBC: Recent Labs  Lab 04/08/23 1819 04/09/23 0433  WBC 7.8 7.2  NEUTROABS 5.5 5.1  HGB 14.0 13.5  HCT 39.7 39.8  MCV 89.0 91.1  PLT 229 228    Basic Metabolic Panel: Recent Labs  Lab 04/08/23 1819 04/09/23 0049 04/09/23 0433 04/09/23 1424  NA 136 137 138 137  K 2.5* 3.0* 2.7* 3.7  CL 90* 93* 92* 96*  CO2 30 33* 33* 30  GLUCOSE 142* 144* 159* 162*  BUN 19 18 18 20   CREATININE 1.83* 1.93* 1.91* 1.87*  CALCIUM 8.9 8.7* 9.5 8.6*  MG  2.6*  --  2.7*  --     GFR: Estimated Creatinine Clearance: 71.4 mL/min ([name]) (by C-G formula based on SCr of 1.87 mg/dL (H)).  Liver Function Tests: Recent Labs  Lab 04/08/23 1819 04/09/23 0433  AST 28 24  ALT 29 25  ALKPHOS 128* 114  BILITOT 1.2 1.2  PROT 7.1 6.6  ALBUMIN 3.3* 3.1*    CBG: Recent Labs  Lab 04/08/23 2249 04/09/23 0736 04/09/23 1141 04/09/23 1608  GLUCAP 187* 158* 176* 194*     No results found for this or any previous visit (from the past 240 hour(s)).        Radiology Studies: DG Chest Port 1 View  Result Date: 04/08/2023 CLINICAL DATA:  syncope EXAM: PORTABLE CHEST 1 VIEW COMPARISON:  Chest x-ray 03/28/2023 FINDINGS: Enlarged cardiac silhouette. The heart and mediastinal contours are unchanged. Left chest wall triple lead pacemaker defibrillator. No focal consolidation. No pulmonary edema. No pleural effusion. No pneumothorax. No acute osseous abnormality. IMPRESSION: No active disease. Electronically Signed   By: Tish Frederickson M.D.   On: 04/08/2023 20:10        Scheduled Meds:  allopurinol  100 mg Oral Daily   amiodarone  400 mg Oral Daily   aspirin EC  81 mg Oral Daily   empagliflozin  10 mg Oral QAC breakfast   insulin aspart  0-9 Units Subcutaneous TID WC   magnesium oxide  400 mg Oral Daily   mexiletine  250 mg Oral BID   pantoprazole  40 mg Oral Daily   potassium chloride  40 mEq Oral BID   ranolazine  500 mg Oral BID   rosuvastatin  40 mg Oral Daily   Continuous Infusions:   LOS: 1 day    Time spent: over 30 min    Lacretia Nicks, MD [facility]   To contact the attending provider between 7A-7P or the covering provider during after hours 7P-7A, please log into the web site [email or web address] and access using universal Vista password for that web site. If you do not have the password, please call the hospital operator.  04/09/2023, 6:21 PM

## 2023-04-09 NOTE — Progress Notes (Signed)
Mobility Specialist Progress Note:   04/09/23 1200  Mobility  Activity Ambulated independently in hallway  Level of Assistance Independent  Assistive Device None  Distance Ambulated (ft) 400 ft  Activity Response Tolerated well  Mobility Referral Yes  $Mobility charge 1 Mobility  Mobility Specialist Start Time (ACUTE ONLY) 1244  Mobility Specialist Stop Time (ACUTE ONLY) 1251  Mobility Specialist Time Calculation (min) (ACUTE ONLY) 7 min    Pre Mobility: 76 HR During Mobility: 80 HR Post Mobility:  79 HR  Received pt in bed having no complaints and agreeable to mobility. Pt was asymptomatic throughout ambulation and returned to room w/o fault. Left ambulating in room w/ call bell in reach and all needs met. RN present.   D'Vante Earlene Plater Mobility Specialist Please contact via Special educational needs teacher or Rehab office at [phone]

## 2023-04-09 NOTE — Consult Note (Addendum)
ELECTROPHYSIOLOGY CONSULT NOTE    Patient ID: Joe [name] MRN: [id number], DOB/AGE: 1971/02/23 52 y.o.  Admit date: 04/08/2023 Date of Consult: 04/09/2023  Primary Physician: Lonie Peak, PA-C Primary Cardiologist: [name], MD  Electrophysiologist: Dr. Elberta Fortis   Referring Provider: Dr. Lowell Guitar  Patient Profile: Joe [name] is a 52 y.o. male with a history of chronic systolic CHF, CAD, HTN, GERD, gout, HLD, OSA, and obesity  who is being seen today for the evaluation of VF with ICD shock at the request of Dr. Lowell Guitar.  HPI:    Seen by CHF clinic 03/09/2023 after he underwent R/LHC (9/24) showing stable 3v CAD, severe biventricular failure with elevated filling pressures and very low PAPi suggestive of poor RV function. Not VAD candidate with poor RV function. Torsemide increased to 40 bid, and weekly metolazone added.  Echo with LVEF 20-25% 01/2023.   He had diuresed > 30 lbs but continued with NYHA IIIb symptoms and transplant eval.    Recently admitted 10/10 - 10/13 for ICD shock in setting of VF. Loaded on IV amiodarone.   Evaluated by [facility] week of 10/14, underwent CPX. Per pt and wife Joe [name] is meeting this Thursday to discuss his case.   10/20 he was sitting in a chair talking to family when he felt tingling and burning throughout his body, similar to his prior arrest. He found himself awakening on the ground with family around him.   His brother in law started compressions immediately, but pt regained consciousness after pts ICD fired twice.   Pt had been taking all medications as directed, took Metolazone Saturday 10/19 with 20 meq of K. Long history of orthopnea and does still have some swelling in his BLEs.   K 2.5 on admission with AKI noted at 1.83 (from 1.32)  This am he is awake and alert. Frustrated to be back in the hospital. States his mouth was dry yesterday and he wondered if he was "dry". No new symptoms. States [facility]  transplant [name] meets Thursday to discuss his case.   Labs Potassium2.7* (10/21 4098) Magnesium  2.7* (10/21 0433) Creatinine, ser  1.91* (10/21 0433) PLT  228 (10/21 0433) HGB  13.5 (10/21 0433) WBC 7.2 (10/21 0433)  .      Physical Exam: Vitals:   04/08/23 2200 04/08/23 2245 04/09/23 0321 04/09/23 0739  BP: 100/65 113/75 100/71 108/75  Pulse: 69  66 67  Resp: (!) 22 20 18 18   Temp: 98 F (36.7 C) 98.5 F (36.9 C) 97.7 F (36.5 C) 98.3 F (36.8 C)  TempSrc:  Oral Oral Oral  SpO2: 99% 96% 94% 94%  Weight:  (!) 142.7 kg    Height:  6\' 4"  (1.93 m)      GEN- NAD, A&O x 3, normal affect HEENT: Normocephalic, atraumatic Lungs- CTAB, Normal effort.  Heart- Regular rate and rhythm, No M/G/R.  GI- Soft, NT, ND.  Extremities- No clubbing, cyanosis, or edema   Radiology/Studies: DG Chest Port 1 View  Result Date: 04/08/2023 CLINICAL DATA:  syncope EXAM: PORTABLE CHEST 1 VIEW COMPARISON:  Chest x-ray 03/28/2023 FINDINGS: Enlarged cardiac silhouette. The heart and mediastinal contours are unchanged. Left chest wall triple lead pacemaker defibrillator. No focal consolidation. No pulmonary edema. No pleural effusion. No pneumothorax. No acute osseous abnormality. IMPRESSION: No active disease. Electronically Signed   By: Tish Frederickson M.D.   On: 04/08/2023 20:10   DG Chest Portable 1 View  Result Date: 03/28/2023 CLINICAL DATA:  Chest pain.  Loss of consciousness. EXAM: PORTABLE CHEST 1 VIEW COMPARISON:  Chest CT 02/26/2023. FINDINGS: Mild cardiomegaly. Left-sided pacemaker in place. Vascular congestion without overt edema. No pneumothorax or large pleural effusion. Please note the right lateral costophrenic angle is excluded from the field of view. IMPRESSION: Mild cardiomegaly with vascular congestion. Electronically Signed   By: Narda Rutherford M.D.   On: 03/28/2023 23:27    EKG:on arrival showed AS/VP aat 76 bpm  (personally reviewed)  TELEMETRY: NSR 60-70s (personally  reviewed)     DEVICE HISTORY:  [employer] BiV ICD implanted 03/19/2019 for chronic systolic heart failure History of appropriate therapy: No History of AAD therapy: Yes; currently on Mexiletine     Studies reviewed: [facility] 02/23/2023 1. Stable 3v CAD 2. Severe biventricular failure with elevated filling pressures and very low PAPi suggestive of poor RV function   Echo 02/15/2023 LVEF < 20%, RV "normal" (though poor by RHC as above), mild MR  Assessment/Plan:  VF with ICD shock PVCs In setting of severe cardiomyopathy and and significant hypokalemia\ OK with transitioning amiodarone to po as long as K is correcting.  Potassium2.7* (10/21 1610) Magnesium  2.7* (10/21 0433) Creatinine, ser  1.91* (10/21 0433) Keep K > 4.0 and Mg > 2.0    Hypokalemia K 2.5 on arrival.   Keep K > 4.0 and Mg > 2.0 as above. Supplement aggressively  Consider spironolactone carefully with AKI.   Chronic systolic CHF Volume status at least somewhat elevated. Transplant work up on-going at [facility].  Had CPX 10/16.  Joe [name] ask HF to see to help with GDMT/Hypokalemia in the long run.   For questions or updates, please contact [facility] Please consult [email or web address] for contact info under Cardiology/STEMI.  Signed, Graciella Freer, PA-C  04/09/2023 7:43 AM    I have seen and examined this patient with Joe [name].  Agree with above, note added to reflect my findings.  Patient with a past history as above.  Yesterday, he was sitting in a chair and felt a tingling and burning throughout his body.  This was similar to a prior arrest.  He woke up on the ground.  His brother-in-law had started chest compressions, but he regained consciousness after ICD fired twice.  He is currently on Lasix and metolazone.  His potassium was significantly reduced at 2.5 on admission.  Currently he feels well and is without complaint.  GEN: Well nourished, well developed, in no acute distress  HEENT: normal  Neck:  no JVD, carotid bruits, or masses Cardiac: RRR; no murmurs, rubs, or gallops,no edema  Respiratory:  clear to auscultation bilaterally, normal work of breathing GI: soft, nontender, nondistended, + BS MS: no deformity or atrophy  Skin: warm and dry, device site well healed Neuro:  Strength and sensation are intact Psych: euthymic mood, full affect   Ventricular fibrillation: Post ICD shock x 2.  Potassium 2.7 on admission.  Correcting with both IV and p.o.  Joe [name] continue amiodarone. Hypokalemia: Significantly low on arrival.  Likely part of the cause of his ventricular fibrillation. Chronic systolic heart failure: Volume status somewhat elevated.  Is undergoing transplant workup at [facility].  Joe [name] ask heart failure to see to adjust medications, possibly adding Aldactone due to hypokalemia.  Joe Weill M. Colan Laymon MD 04/09/2023 12:44 PM

## 2023-04-10 ENCOUNTER — Ambulatory Visit: Payer: 59 | Admitting: Podiatry

## 2023-04-10 DIAGNOSIS — I5023 Acute on chronic systolic (congestive) heart failure: Secondary | ICD-10-CM | POA: Diagnosis not present

## 2023-04-10 DIAGNOSIS — I4901 Ventricular fibrillation: Secondary | ICD-10-CM | POA: Diagnosis not present

## 2023-04-10 LAB — BASIC METABOLIC PANEL
Anion gap: 13 (ref 5–15)
BUN: 20 mg/dL (ref 6–20)
CO2: 34 mmol/L — ABNORMAL HIGH (ref 22–32)
Calcium: 9.7 mg/dL (ref 8.9–10.3)
Chloride: 93 mmol/L — ABNORMAL LOW (ref 98–111)
Creatinine, Ser: 1.65 mg/dL — ABNORMAL HIGH (ref 0.61–1.24)
GFR, Estimated: 50 mL/min — ABNORMAL LOW (ref >60)
Glucose, Bld: 123 mg/dL — ABNORMAL HIGH (ref 70–99)
Potassium: 3.1 mmol/L — ABNORMAL LOW (ref 3.5–5.1)
Sodium: 140 mmol/L (ref 135–145)

## 2023-04-10 LAB — GLUCOSE, CAPILLARY
Glucose-Capillary: 128 mg/dL — ABNORMAL HIGH (ref 70–99)
Glucose-Capillary: 139 mg/dL — ABNORMAL HIGH (ref 70–99)
Glucose-Capillary: 175 mg/dL — ABNORMAL HIGH (ref 70–99)
Glucose-Capillary: 203 mg/dL — ABNORMAL HIGH (ref 70–99)

## 2023-04-10 LAB — T4, FREE: Free T4: 1.01 ng/dL (ref 0.61–1.12)

## 2023-04-10 LAB — TSH: TSH: 5.393 u[IU]/mL — ABNORMAL HIGH (ref 0.350–4.500)

## 2023-04-10 LAB — MAGNESIUM: Magnesium: 2.9 mg/dL — ABNORMAL HIGH (ref 1.7–2.4)

## 2023-04-10 MED ORDER — GABAPENTIN 100 MG PO CAPS
100.0000 mg | ORAL_CAPSULE | Freq: Two times a day (BID) | ORAL | Status: DC
  Administered 2023-04-10 – 2023-04-12 (×5): 100 mg via ORAL
  Filled 2023-04-10 (×5): qty 1

## 2023-04-10 MED ORDER — POLYETHYLENE GLYCOL 3350 17 G PO PACK
17.0000 g | PACK | Freq: Every day | ORAL | Status: DC
  Administered 2023-04-10 – 2023-04-11 (×2): 17 g via ORAL
  Filled 2023-04-10 (×2): qty 1

## 2023-04-10 MED ORDER — POTASSIUM CHLORIDE CRYS ER 20 MEQ PO TBCR
40.0000 meq | EXTENDED_RELEASE_TABLET | ORAL | Status: AC
  Administered 2023-04-10 (×2): 40 meq via ORAL
  Filled 2023-04-10 (×2): qty 2

## 2023-04-10 MED ORDER — FUROSEMIDE 10 MG/ML IJ SOLN
80.0000 mg | Freq: Two times a day (BID) | INTRAMUSCULAR | Status: DC
Start: 2023-04-10 — End: 2023-04-11
  Administered 2023-04-10 – 2023-04-11 (×3): 80 mg via INTRAVENOUS
  Filled 2023-04-10 (×3): qty 8

## 2023-04-10 MED ORDER — POTASSIUM CHLORIDE CRYS ER 20 MEQ PO TBCR
40.0000 meq | EXTENDED_RELEASE_TABLET | Freq: Two times a day (BID) | ORAL | Status: DC
  Administered 2023-04-10 – 2023-04-12 (×5): 40 meq via ORAL
  Filled 2023-04-10 (×5): qty 2

## 2023-04-10 MED ORDER — SENNA 8.6 MG PO TABS
1.0000 | ORAL_TABLET | Freq: Every day | ORAL | Status: DC
  Administered 2023-04-10: 8.6 mg via ORAL
  Filled 2023-04-10: qty 1

## 2023-04-10 MED ORDER — SPIRONOLACTONE 12.5 MG HALF TABLET
12.5000 mg | ORAL_TABLET | Freq: Every day | ORAL | Status: DC
  Administered 2023-04-10 – 2023-04-11 (×2): 12.5 mg via ORAL
  Filled 2023-04-10 (×2): qty 1

## 2023-04-10 MED ORDER — AMIODARONE HCL 200 MG PO TABS
400.0000 mg | ORAL_TABLET | Freq: Two times a day (BID) | ORAL | Status: DC
  Administered 2023-04-10 – 2023-04-12 (×4): 400 mg via ORAL
  Filled 2023-04-10 (×4): qty 2

## 2023-04-10 NOTE — Progress Notes (Addendum)
Patient Name: Joe [name] Date of Encounter: 04/10/2023  Primary Cardiologist: [name], MD Electrophysiologist: Aleia Larocca Jorja Loa, MD  Interval Summary   The patient is doing well today.  At this time, the patient denies chest pain, shortness of breath, or any new concerns.  Appreciate to have HF team on board.   Labs initially pending this am.   Vital Signs    Vitals:   04/09/23 1943 04/10/23 0058 04/10/23 0537 04/10/23 0748  BP: 100/68 99/66 99/65  96/77  Pulse: 71 69 64 66  Resp:   20 18  Temp: 97.9 F (36.6 C) 97.6 F (36.4 C) 98.2 F (36.8 C) 97.6 F (36.4 C)  TempSrc: Oral Oral Oral Oral  SpO2: 97% 94% 94% 94%  Weight:   (!) 137.8 kg   Height:        Intake/Output Summary (Last 24 hours) at 04/10/2023 0836 Last data filed at 04/10/2023 0745 Gross per 24 hour  Intake 2098.75 ml  Output 4890 ml  Net -2791.25 ml   Filed Weights   04/08/23 1809 04/08/23 2245 04/10/23 0537  Weight: (!) 142.9 kg (!) 142.7 kg (!) 137.8 kg    Physical Exam    GEN- The patient is well appearing, alert and oriented x 3 today.   Lungs- Clear to ausculation bilaterally, normal work of breathing Cardiac- Regular rate and rhythm, no murmurs, rubs or gallops GI- soft, NT, ND, + BS Extremities- no clubbing or cyanosis. No edema  Telemetry    NSR 60-70s, no further VT (personally reviewed)  Hospital Course    Joe [name] is a 52 y.o. male with a history of chronic systolic CHF, CAD, HTN, GERD, gout, HLD, OSA, and obesity  who is being seen today for the evaluation of VF with ICD shock at the request of Dr. Lowell Guitar.   Assessment & Plan    VF with ICD shock PVCs In setting of severe cardiomyopathy and and significant hypokalemia\ OK with transitioning amiodarone to po as long as K is correcting.  Potassium3.7 (10/21 1424)  ->  Pending this am Magnesium  2.7* (10/21 0433) ->  Pending this am Creatinine, ser  1.87* (10/21 1424)->  Pending this  am Keep K > 4.0 and Mg > 2.0  Increase amiodarone to 400 mg BID x 1 week, then would continue 200 mg BID until follow up.    Hypokalemia K 2.5 on arrival.   Keep K > 4.0 and Mg > 2.0 as above. Rosie Torrez need to take more K on metolazone days, previously had been taking 20 meq extra.  Supplement aggressively. Appreciate HF team input.  Potentially planning on adding spironolactone today pending labwork.    Chronic systolic CHF Volume status at least somewhat elevated. Transplant work up on-going at [facility].  Had CPX 10/16. Appreciate HF team. Note consideration of RHC pending coarse or even potentially inpatient transfer to continue his transplant evaluation.   EP Hellena Pridgen follow at a distance as his arrhythmia has settled.  Please call back with questions or with further VT/VF.   For questions or updates, please contact [facility] Please consult [email or web address] for contact info under Cardiology/STEMI.  Signed, Graciella Freer, PA-C  04/10/2023, 8:36 AM   I have seen and examined this patient with Joe [name].  Agree with above, note added to reflect my findings.  Feeling well without ventricular arrhythmias  GEN: Well nourished, well developed, in no acute distress  HEENT: normal  Neck: no JVD, carotid bruits, or masses Cardiac: RRR;  no murmurs, rubs, or gallops,no edema  Respiratory:  clear to auscultation bilaterally, normal work of breathing GI: soft, nontender, nondistended, + BS MS: no deformity or atrophy  Skin: warm and dry, device site well healed Neuro:  Strength and sensation are intact Psych: euthymic mood, full affect   Ventricular fibrillation: Post ICD shock.  Potassium was significantly reduced.  He had been undergoing aggressive diuresis.  Would continue amiodarone as above.  Potassium supplementation ongoing.  Appreciate heart failure team's care for his hypokalemia. Hypokalemia: Aggressively supplementation.  May add Aldactone per heart failure  cardiology. Chronic systolic heart failure: Volume mildly elevated.  Has undergone transplant workup at [facility].  Appreciate heart failure team assistance.  EP to follow at a distance.  Please call us back if further ventricular arrhythmias occur.  Lin Hackmann M. Odell Fasching MD 04/10/2023 8:57 AM

## 2023-04-10 NOTE — Progress Notes (Signed)
Advanced Heart Failure Rounding Note  PCP-Cardiologist: [name], MD   Subjective:    Rhythm stable. Occasional PVCs but no further VT.   Brisk diuresis yesterday after dose of IV Lasix, 5.3L in UOP. Net negative 3.6 L.   BMP pending   SBPs mid 90s-low 100s.   Feels well. Breathing and leg edema both improved. Denies CP. Wife present at bedside.    Objective:   Weight Range: (!) 137.8 kg Body mass index is 36.99 kg/m.   Vital Signs:   Temp:  [97.6 F (36.4 C)-98.5 F (36.9 C)] 97.6 F (36.4 C) (10/22 0748) Pulse Rate:  [38-73] 66 (10/22 0748) Resp:  [18-20] 18 (10/22 0748) BP: (96-108)/(63-77) 96/77 (10/22 0748) SpO2:  [94 %-97 %] 94 % (10/22 0748) Weight:  [137.8 kg] 137.8 kg (10/22 0537)    Weight change: Filed Weights   04/08/23 1809 04/08/23 2245 04/10/23 0537  Weight: (!) 142.9 kg (!) 142.7 kg (!) 137.8 kg    Intake/Output:   Intake/Output Summary (Last 24 hours) at 04/10/2023 0849 Last data filed at 04/10/2023 0745 Gross per 24 hour  Intake 2098.75 ml  Output 4890 ml  Net -2791.25 ml      Physical Exam    General:  Well appearing, obese No resp difficulty HEENT: Normal Neck: Supple. JVP 9 cm . Carotids 2+ bilat; no bruits. No lymphadenopathy or thyromegaly appreciated. Cor: PMI nondisplaced. Regular rate & rhythm. No rubs, gallops or murmurs. Lungs: Clear Abdomen: Soft, nontender, nondistended. No hepatosplenomegaly. No bruits or masses. Good bowel sounds. Extremities: No cyanosis, clubbing, rash, 1+ b/l LE edema + unna boots  Neuro: Alert & orientedx3, cranial nerves grossly intact. moves all 4 extremities w/o difficulty. Affect pleasant   Telemetry   V paced, occasional PVCs. No further VT   EKG    No new EKG to review   Labs    CBC Recent Labs    04/08/23 1819 04/09/23 0433  WBC 7.8 7.2  NEUTROABS 5.5 5.1  HGB 14.0 13.5  HCT 39.7 39.8  MCV 89.0 91.1  PLT 229 228   Basic Metabolic Panel Recent Labs     04/08/23 1819 04/09/23 0049 04/09/23 0433 04/09/23 1424  NA 136   < > 138 137  K 2.5*   < > 2.7* 3.7  CL 90*   < > 92* 96*  CO2 30   < > 33* 30  GLUCOSE 142*   < > 159* 162*  BUN 19   < > 18 20  CREATININE 1.83*   < > 1.91* 1.87*  CALCIUM 8.9   < > 9.5 8.6*  MG 2.6*  --  2.7*  --    < > = values in this interval not displayed.   Liver Function Tests Recent Labs    04/08/23 1819 04/09/23 0433  AST 28 24  ALT 29 25  ALKPHOS 128* 114  BILITOT 1.2 1.2  PROT 7.1 6.6  ALBUMIN 3.3* 3.1*   No results for input(s): "LIPASE", "AMYLASE" in the last 72 hours. Cardiac Enzymes No results for input(s): "CKTOTAL", "CKMB", "CKMBINDEX", "TROPONINI" in the last 72 hours.  BNP: BNP (last 3 results) Recent Labs    03/09/23 1243 03/28/23 2205 04/08/23 1820  BNP 221.8* 178.3* 182.4*    ProBNP (last 3 results) Recent Labs    05/10/22 0905 08/10/22 1534  PROBNP 475* 351*     D-Dimer No results for input(s): "DDIMER" in the last 72 hours. Hemoglobin A1C No results for input(s): "  HGBA1C" in the last 72 hours. Fasting Lipid Panel No results for input(s): "CHOL", "HDL", "LDLCALC", "TRIG", "CHOLHDL", "LDLDIRECT" in the last 72 hours. Thyroid Function Tests No results for input(s): "TSH", "T4TOTAL", "T3FREE", "THYROIDAB" in the last 72 hours.  Invalid input(s): "FREET3"  Other results:   Imaging    No results found.   Medications:     Scheduled Medications:  allopurinol  100 mg Oral Daily   amiodarone  400 mg Oral BID   aspirin EC  81 mg Oral Daily   empagliflozin  10 mg Oral QAC breakfast   furosemide  80 mg Intravenous BID   gabapentin  100 mg Oral BID   insulin aspart  0-9 Units Subcutaneous TID WC   magnesium oxide  400 mg Oral Daily   mexiletine  250 mg Oral BID   pantoprazole  40 mg Oral Daily   ranolazine  500 mg Oral BID   rosuvastatin  40 mg Oral Daily    Infusions:   PRN Medications: acetaminophen **OR** acetaminophen, melatonin,  senna    Patient Profile   52 y.o. male with h/o morbid obesity, DM2, HTN, HL, OSA, CAD and systolic HF (EF 20-25%), currently undergoing [facility] evaluation at [facility], admitted w/ VT and ICD shocks, in the setting of hypokalemia and a/c systolic heart failure w/ volume overload.   Assessment/Plan   1. VT Arrest  - recurrent episodes, 2nd admit in the last 2 wks - admitted 10/10-10/13 w/ VF treated w/ ICD shock>>loaded w/ IV amio  - readmitted now w/ VT arrest, treated w/ appropriate ICD shock x 2 + brief bystander CPR  - in setting of underling CM w/ EF < 35% + hypokalemia (K 2.5) - K initially repleated but need to follow w/ ongoing need for IV diuretics. BMP pending  - no further VT on tele     - aggressive K supp. Keep K > 4.0 and Mg > 2.0  - PO amio per EP, 400 mg bid  - Mexiletine 250 mg bid    2 Acute on Chronic Systolic HF - due to iCM - Echo (8/20): EF 20-25%  - Echo (9/22): 35-40% - Echo (6/23): EF 30-35% - Echo (8/24): EF 20-25%, RV ok. - R/LHC (9/24): stable 3v CAD, elevated biventricular filling pressures with low PAPi; CI 2.2 - He appears to have end-stage HF. With poor RV function, not a VAD candidate. BMI may prohibit transplant consideration. He has been referred to [facility] for transplant work up. Was seen by Dr. Edwena Blow on 10/14. Plan is to further discuss candidacy w/ transplant team later this week  - NYHA Class II-IIb. Volume assessment difficult given body habitus/obesity but he has peripheral edema on exam and reports 15 lb wt gain over the last several days + reduced urinary response to home diuretic regimen. CXR shows no frank pulmonary edema and device impedence has been drifting down and right at reference curve, signalling likely fluid accumulation. He was noted to have low PAPi on recent RHC c/w significant RV failure, thus explaining R>L sided symptoms. He has an AKI w/ SCr elevated at 1.9 (b/l 1.2), this is likely 2/2 renal venous congestion from volume  overload +/- possible transient hypotension w/ brief cardiac arrest. He is diuresing well w/ IV Lasix. Volume improving  - needs additional diuresis but will await until f/u BMP results before giving 2nd dose of IV Lasix - once back on oral regimen, he will need to take extra K supp on metolazone days  -  Hold Entresto for now w/ AKI and soft BP  - If SCr improved, can try adding back low dose spiro to keep maintain K  - Continue Jardiance 10 mg daily. - follow BMP daily    3. CAD with chronic stable angina - s/p multiple stents to LAD and LCX - Cath 9/22 stable CAD - LHC (9/24) with stable 3v CAD - denies CP  - Continue ASA. - Imdur held on admit w/ soft BP  - Continue Ranexa 500 mg bid.  - On high-intensity statin.   4. AKI  - SCr elevated at 1.9 (b/l 1.2), this is likely 2/2 renal venous congestion from volume overload +/- possible transient hypotension w/ brief cardiac arrest - diuresis per above - avoid hypotension, limited HF GDMT for now - follow BMP    5. DM2 - A1c 7.2 (1/24) - Continue SGLT2i   6. OSA - Not using CPAP, says machine was recalled a while ago - Follows with Dr. Mayford Knife - will need updated sleep study for new CPAP    7. Obesity - Body mass index is 38.75 kg/m. - Has struggled with weight loss - Continue Ozempic, may have better success with tirzepatide.   8. Frequent PVCs - On mexilitene 250 mg bid - Zio 5/22 PVC burden 5.4%.  - Followed by Dr. Elberta Fortis - Continue current management - keep K > 4.0 and Mg > 2.0      Length of Stay: 2  Robbie Lis, PA-C  04/10/2023, 8:49 AM  Advanced Heart Failure Team Pager [phone] (M-F; 7a - 5p)  Please contact [facility] for night-coverage after hours (5p -7a ) and weekends on [email or web address]

## 2023-04-10 NOTE — Plan of Care (Signed)

## 2023-04-10 NOTE — Progress Notes (Signed)
Mobility Specialist Progress Note:   04/10/23 1400  Oxygen Therapy  O2 Device Room Air  Mobility  Activity Ambulated independently in hallway  Level of Assistance Independent  Assistive Device None  Distance Ambulated (ft) 400 ft  Activity Response Tolerated well  Mobility Referral Yes  $Mobility charge 1 Mobility  Mobility Specialist Start Time (ACUTE ONLY) 1430  Mobility Specialist Stop Time (ACUTE ONLY) 1436  Mobility Specialist Time Calculation (min) (ACUTE ONLY) 6 min    Pre Mobility: 84 HR During Mobility: 89 HR Post Mobility:  88 HR  Pt received in BR, agreeable to mobility. Asymptomatic throughout w/ no complaints. Pt left on EOB with call bell and all needs met. Family present.  D'Vante Earlene Plater Mobility Specialist Please contact via Special educational needs teacher or Rehab office at [phone]

## 2023-04-10 NOTE — Progress Notes (Addendum)
PROGRESS NOTE    Joe [name]  OAC:[id number] DOB: [birth year] DOA: 04/08/2023 PCP: Lonie Peak, PA-C  Chief Complaint  Patient presents with   Pacemaker Problem    Brief Narrative:   Joe [name] is Joe [name] 52 y.o. male with medical history significant for chronic systolic heart failure in setting of ischemic cardiomyopathy, V-fib arrest, hyperlipidemia, obstructive sleep apnea on home nocturnal CPAP, type 2 diabetes mellitus, who is admitted to [facility] on 04/08/2023 with V-fib arrest status post ROSC after presenting from home to [facility] ED complaining of cardiac arrest.   He was admitted to the hospitalist service.  EP and HF service were consulted.  He was noted to have severe hypokalemia at presentation.  This has been aggressively replaced.  He's being diuresed per cardiology.    I've asked the heart failure service to take over as primary on 10/23, we will continue to follow as consultants.   Assessment & Plan:   Principal Problem:   Ventricular fibrillation (HCC) Active Problems:   Obstructive sleep apnea   GERD (gastroesophageal reflux disease)   Chronic systolic heart failure (HCC)   Hyperlipidemia   DM2 (diabetes mellitus, type 2) (HCC)   Hypokalemia   Cardiac arrest (HCC)   HFrEF (heart failure with reduced ejection fraction) (HCC)   AKI (acute kidney injury) (HCC)  V Fib Arrest  Amiodarone, mexiletine Per EP, appreciate assistance  Ischemic Cardiomyopathy (EF <20% on 01/2023 echo) Diuresis per cards Jardiance Concern regarding worsening renal function and diuretic resistance -> they're planning to discuss case with [facility]   Hypokalemia Due to diuretics/metolazone.  Significantly low, aggressive repletion in setting of vfib arrest. Cardiology considering adding aldactone  Labs pending today  Acute Kidney Injury Baseline creatinine appears to be 1.32 1.8 on presentation Follow with diuresis per cards  Subclinical  Hypothyroidism TSH 5.94 (slightly elevated), free T4 (wnl) on 10/14 labs  Repeat labs in 4-6 weeks  OSA Cpap  T2DM Jardiance SSI  Gout Allopurinol  GERD PPI   Obesity Body mass index is 36.99 kg/m.  Awaiting today's labs     DVT prophylaxis: SCD Code Status: full Family Communication: wife Disposition:   Status is: Inpatient Remains inpatient appropriate because: need for inpatient care   Consultants:  Cards   Procedures:  none  Antimicrobials:  Anti-infectives (From admission, onward)    None       Subjective: No complaints  Objective: Vitals:   04/09/23 1943 04/10/23 0058 04/10/23 0537 04/10/23 0748  BP: 100/68 99/66 99/65  96/77  Pulse: 71 69 64 66  Resp:   20 18  Temp: 97.9 F (36.6 C) 97.6 F (36.4 C) 98.2 F (36.8 C) 97.6 F (36.4 C)  TempSrc: Oral Oral Oral Oral  SpO2: 97% 94% 94% 94%  Weight:   (!) 137.8 kg   Height:        Intake/Output Summary (Last 24 hours) at 04/10/2023 0932 Last data filed at 04/10/2023 0109 Gross per 24 hour  Intake 1738.75 ml  Output 5690 ml  Net -3951.25 ml   Filed Weights   04/08/23 1809 04/08/23 2245 04/10/23 0537  Weight: (!) 142.9 kg (!) 142.7 kg (!) 137.8 kg    Examination:  General: No acute distress. Cardiovascular: RRR Lungs: diminished Neurological: Alert and oriented 3. Moves all extremities 4 with equal strength. Cranial nerves II through XII grossly intact. Extremities: unna boots on    Data Reviewed: I have personally reviewed following labs and imaging studies  CBC: Recent Labs  Lab 04/08/23 1819 04/09/23 0433  WBC 7.8 7.2  NEUTROABS 5.5 5.1  HGB 14.0 13.5  HCT 39.7 39.8  MCV 89.0 91.1  PLT 229 228    Basic Metabolic Panel: Recent Labs  Lab 04/08/23 1819 04/09/23 0049 04/09/23 0433 04/09/23 1424  NA 136 137 138 137  K 2.5* 3.0* 2.7* 3.7  CL 90* 93* 92* 96*  CO2 30 33* 33* 30  GLUCOSE 142* 144* 159* 162*  BUN 19 18 18 20   CREATININE 1.83* 1.93* 1.91*  1.87*  CALCIUM 8.9 8.7* 9.5 8.6*  MG 2.6*  --  2.7*  --     GFR: Estimated Creatinine Clearance: 70.1 mL/min (Joe [name]) (by C-G formula based on SCr of 1.87 mg/dL (H)).  Liver Function Tests: Recent Labs  Lab 04/08/23 1819 04/09/23 0433  AST 28 24  ALT 29 25  ALKPHOS 128* 114  BILITOT 1.2 1.2  PROT 7.1 6.6  ALBUMIN 3.3* 3.1*    CBG: Recent Labs  Lab 04/09/23 0736 04/09/23 1141 04/09/23 1608 04/09/23 2044 04/10/23 0746  GLUCAP 158* 176* 194* 205* 128*     No results found for this or any previous visit (from the past 240 hour(s)).       Radiology Studies: DG Chest Port 1 View  Result Date: 04/08/2023 CLINICAL DATA:  syncope EXAM: PORTABLE CHEST 1 VIEW COMPARISON:  Chest x-ray 03/28/2023 FINDINGS: Enlarged cardiac silhouette. The heart and mediastinal contours are unchanged. Left chest wall triple lead pacemaker defibrillator. No focal consolidation. No pulmonary edema. No pleural effusion. No pneumothorax. No acute osseous abnormality. IMPRESSION: No active disease. Electronically Signed   By: Tish Frederickson M.D.   On: 04/08/2023 20:10        Scheduled Meds:  allopurinol  100 mg Oral Daily   amiodarone  400 mg Oral BID   aspirin EC  81 mg Oral Daily   empagliflozin  10 mg Oral QAC breakfast   furosemide  80 mg Intravenous BID   gabapentin  100 mg Oral BID   insulin aspart  0-9 Units Subcutaneous TID WC   magnesium oxide  400 mg Oral Daily   mexiletine  250 mg Oral BID   pantoprazole  40 mg Oral Daily   ranolazine  500 mg Oral BID   rosuvastatin  40 mg Oral Daily   Continuous Infusions:   LOS: 2 days    Time spent: over 30 min    Lacretia Nicks, MD [facility]   To contact the attending provider between 7A-7P or the covering provider during after hours 7P-7A, please log into the web site [email or web address] and access using universal [place] password for that web site. If you do not have the password, please call the hospital  operator.  04/10/2023, 9:32 AM

## 2023-04-11 DIAGNOSIS — I4901 Ventricular fibrillation: Secondary | ICD-10-CM | POA: Diagnosis not present

## 2023-04-11 DIAGNOSIS — I5023 Acute on chronic systolic (congestive) heart failure: Secondary | ICD-10-CM | POA: Diagnosis not present

## 2023-04-11 LAB — PHOSPHORUS: Phosphorus: 4.7 mg/dL — ABNORMAL HIGH (ref 2.5–4.6)

## 2023-04-11 LAB — MAGNESIUM: Magnesium: 2.9 mg/dL — ABNORMAL HIGH (ref 1.7–2.4)

## 2023-04-11 LAB — BASIC METABOLIC PANEL
Anion gap: 8 (ref 5–15)
BUN: 18 mg/dL (ref 6–20)
CO2: 34 mmol/L — ABNORMAL HIGH (ref 22–32)
Calcium: 9.4 mg/dL (ref 8.9–10.3)
Chloride: 98 mmol/L (ref 98–111)
Creatinine, Ser: 1.57 mg/dL — ABNORMAL HIGH (ref 0.61–1.24)
GFR, Estimated: 53 mL/min — ABNORMAL LOW (ref >60)
Glucose, Bld: 127 mg/dL — ABNORMAL HIGH (ref 70–99)
Potassium: 3.8 mmol/L (ref 3.5–5.1)
Sodium: 140 mmol/L (ref 135–145)

## 2023-04-11 LAB — GLUCOSE, CAPILLARY
Glucose-Capillary: 113 mg/dL — ABNORMAL HIGH (ref 70–99)
Glucose-Capillary: 138 mg/dL — ABNORMAL HIGH (ref 70–99)
Glucose-Capillary: 163 mg/dL — ABNORMAL HIGH (ref 70–99)
Glucose-Capillary: 133 mg/dL — ABNORMAL HIGH (ref 70–99)

## 2023-04-11 LAB — T3, FREE: T3, Free: 2.7 pg/mL (ref 2.0–4.4)

## 2023-04-11 LAB — OCCULT BLOOD X 1 CARD TO LAB, STOOL: Fecal Occult Bld: NEGATIVE

## 2023-04-11 MED ORDER — SPIRONOLACTONE 25 MG PO TABS
25.0000 mg | ORAL_TABLET | Freq: Every day | ORAL | Status: DC
  Administered 2023-04-12: 25 mg via ORAL
  Filled 2023-04-11: qty 1

## 2023-04-11 MED ORDER — SENNOSIDES-DOCUSATE SODIUM 8.6-50 MG PO TABS
2.0000 | ORAL_TABLET | Freq: Two times a day (BID) | ORAL | Status: DC
  Administered 2023-04-11 – 2023-04-12 (×3): 2 via ORAL
  Filled 2023-04-11 (×3): qty 2

## 2023-04-11 MED ORDER — SORBITOL 70 % SOLN
30.0000 mL | Freq: Once | Status: AC
  Administered 2023-04-11: 30 mL via ORAL
  Filled 2023-04-11: qty 30

## 2023-04-11 MED ORDER — POLYETHYLENE GLYCOL 3350 17 G PO PACK
17.0000 g | PACK | Freq: Three times a day (TID) | ORAL | Status: DC
  Administered 2023-04-11 (×2): 17 g via ORAL
  Filled 2023-04-11 (×3): qty 1

## 2023-04-11 NOTE — Discharge Summary (Signed)
Advanced Heart Failure Team  Discharge Summary   Patient ID: Joe [name] MRN: [id number], DOB/AGE: Jun 17, 1971 52 y.o. Admit date: 04/08/2023 D/C date:     04/12/2023   Primary Discharge Diagnoses:  Out of Hospital VT/VF Arrest w/ ICD Shocks Hypokalemia Acute on Chronic Systolic Heart Failure, NYHA III  AKI    Secondary Discharge Diagnoses:  CAD Type 2DM  OSA Obesity   Hospital Course:   52 y.o. male with h/o morbid obesity, DM2, HTN, HL, OSA, CAD and systolic HF (EF 20-25%), currently undergoing transplant evaluation at [facility], admitted w/ VT/VF arrest and ICD shocks, in the setting of hypokalemia and a/c systolic heart failure w/ volume overload.   Per report, patient was sitting in a chair at home on 10/20 and felt a tingling and burning throughout his body. This was similar to a prior arrest. He woke up on the ground. His brother-in-law had started chest compressions, but he regained consciousness after ICD fired twice. He was transported to [facility] by EMS.   In the ED, Initial K was 2.5. Mg 2.6. He received aggressive K supp. EP evaluated and started on amiodarone gtt. After IV Load, was transitioned to PO. Mexiletine was continued at 250 mg bid. He had no further VT.   AHF team was consulted for HF management. He was diuresed w/ IV Lasix and responded well. Diuresed total of 10 lb. AKI improved, SCr trended down from 1.9 on admit to 1.49 by day of discharge. After diuresis w/ IV Lasix, he was transitioned back to PO torsemide and dose increased to 60 mg bid (40 mg bid PTA). Decision was made to keep off of metolazone going forward. He can use Furoscix PRN (has home supply). PO amiodarone continued at 400 mg bid x 7 days>>200 mg bid until EP f/u (11/18).    He was last seen and examined by Dr. Elwyn Lade on 10/24 and felt stable for d/c home. Post hospital f/u arranged in the [facility] on 04/18/23. Has f/u w/ EP on 11/18. He will continue to follow-up w/ [facility] team.        Discharge Weight Range: 305 lb  Discharge Vitals: Blood pressure 101/67, pulse 69, temperature 97.6 F (36.4 C), temperature source Oral, resp. rate 16, height 6\' 4"  (1.93 m), weight (!) 138.6 kg, SpO2 97%.  Labs: Lab Results  Component Value Date   WBC 7.2 04/09/2023   HGB 13.5 04/09/2023   HCT 39.8 04/09/2023   MCV 91.1 04/09/2023   PLT 228 04/09/2023    Recent Labs  Lab 04/09/23 0433 04/09/23 1424 04/12/23 0509  NA 138   < > 139  K 2.7*   < > 3.7  CL 92*   < > 103  CO2 33*   < > 26  BUN 18   < > 18  CREATININE 1.91*   < > 1.49*  CALCIUM 9.5   < > 8.8*  PROT 6.6  --   --   BILITOT 1.2  --   --   ALKPHOS 114  --   --   ALT 25  --   --   AST 24  --   --   GLUCOSE 159*   < > 156*   < > = values in this interval not displayed.   Lab Results  Component Value Date   CHOL 172 11/28/2021   HDL 27 (L) 11/28/2021   LDLCALC 96 11/28/2021   TRIG 244 (H) 11/28/2021   BNP (last 3 results) Recent  Labs    03/09/23 1243 03/28/23 2205 04/08/23 1820  BNP 221.8* 178.3* 182.4*    ProBNP (last 3 results) Recent Labs    05/10/22 0905 08/10/22 1534  PROBNP 475* 351*     Diagnostic Studies/Procedures   No results found.  Discharge Medications   Allergies as of 04/12/2023   No Known Allergies      Medication List     STOP taking these medications    ibuprofen 200 MG tablet Commonly known as: ADVIL   metolazone 2.5 MG tablet Commonly known as: ZAROXOLYN       TAKE these medications    acetaminophen 500 MG tablet Commonly known as: TYLENOL Take 1,000 mg by mouth every 6 (six) hours as needed for moderate pain or headache.   allopurinol 100 MG tablet Commonly known as: ZYLOPRIM Take 100 mg by mouth daily.   amiodarone 200 MG tablet Commonly known as: PACERONE Take 2 tablets (400 mg total) by mouth 2 (two) times daily for 7 days, THEN 1 tablet (200 mg total) 2 (two) times daily. Start taking on: April 12, 2023 What changed:  medication  strength See the new instructions.   aspirin EC 81 MG tablet Take 1 tablet (81 mg total) by mouth daily.   BLINK TEARS OP Place 2 drops into both eyes 4 (four) times daily as needed (for dry eyes).   colchicine 0.6 MG tablet Take 0.6 mg by mouth daily as needed (Gout).   diclofenac Sodium 1 % Gel Commonly known as: VOLTAREN APPLY 1 G TOPICALLY DAILY. What changed: See the new instructions.   empagliflozin 10 MG Tabs tablet Commonly known as: Jardiance Take 1 tablet (10 mg total) by mouth daily before breakfast.   escitalopram 10 MG tablet Commonly known as: LEXAPRO Take 10 mg by mouth at bedtime.   gabapentin 100 MG capsule Commonly known as: NEURONTIN Take 100-200 mg by mouth 2 (two) times daily.   magnesium oxide 400 (240 Mg) MG tablet Commonly known as: MAG-OX Take 1 tablet (400 mg total) by mouth daily. Start taking on: April 13, 2023   mexiletine 250 MG capsule Commonly known as: MEXITIL Take 1 capsule (250 mg total) by mouth 2 (two) times daily.   nitroGLYCERIN 0.4 MG SL tablet Commonly known as: NITROSTAT Place 1 tablet (0.4 mg total) under the tongue every 5 (five) minutes as needed for chest pain.   omeprazole 40 MG capsule Commonly known as: PRILOSEC Take 1 capsule (40 mg total) by mouth daily.   Ozempic (1 MG/DOSE) 4 MG/3ML Sopn Generic drug: Semaglutide (1 MG/DOSE) Inject 1 mg into the skin every Monday.   potassium chloride SA 20 MEQ tablet Commonly known as: KLOR-CON M Take 2 tablets (40 mEq total) by mouth 3 (three) times daily. What changed: See the new instructions.   ranolazine 500 MG 12 hr tablet Commonly known as: RANEXA Take 1 tablet (500 mg total) by mouth 2 (two) times daily.   rosuvastatin 40 MG tablet Commonly known as: CRESTOR Take 1 tablet (40 mg total) by mouth daily. What changed: when to take this   spironolactone 25 MG tablet Commonly known as: ALDACTONE Take 1 tablet (25 mg total) by mouth daily. Start taking on:  April 13, 2023   torsemide 20 MG tablet Commonly known as: DEMADEX Take 3 tablets (60 mg total) by mouth 2 (two) times daily. What changed: See the new instructions.   triamcinolone cream 0.1 % Commonly known as: KENALOG Apply 1 Application topically 2 (two) times  daily.   Vicks VapoInhaler Inha Inhale 1 puff into the lungs daily as needed (congestion).        Disposition   The patient will be discharged in stable condition to home.   Follow-up Information     Lonie Peak, PA-C. Go in 3 day(s).   Specialty: Physician Assistant Why: Hospital follow up appointment scheduled for Monday, April 16, 2023 at 2:45 PM.  PLEASE ARRIVE 10-15 minutes early to appoinment.  PLEASE called to cancel/reschedule appointment if you CANNOT make it. Contact information: [address] [place] [address] [phone]         [facility] Follow up.   Specialty: Cardiology Why: 04/18/23 at 10:30 AM   Hospital Follow-up in the Advanced Heart Failure Clinic Contact information: [address] [place] [address] [phone]        Graciella Freer, PA-C Follow up.   Specialty: Cardiology Why: 05/07/23 at 11:40 AM   Follow-up w/ Electrophysiology Contact information: [address] [place] [address] [phone]                   Duration of Discharge Encounter: Greater than 35 minutes   Signed, Robbie Lis, PA-C  04/12/2023, 11:19 AM

## 2023-04-11 NOTE — Progress Notes (Signed)
   CC:  Chief Complaint  Patient presents with   Pacemaker Problem      Subjective:  Patient reports feeling good. Happy about his diuresis. No specific concerns today.  Objective:  BP 96/67 (BP Location: Left Arm)   Pulse 73   Temp 98.4 F (36.9 C) (Oral)   Resp 19   Ht 6\' 4"  (1.93 m)   Wt (!) 139.9 kg   SpO2 95%   BMI 37.54 kg/m   General exam: Appears calm and comfortable Respiratory system: Clear to auscultation. Respiratory effort normal. Cardiovascular system: S1 & S2 heard, RRR. Gastrointestinal system: Abdomen is nondistended, soft and nontender. Normal bowel sounds heard. Central nervous system: Alert and oriented. No focal neurological deficits. Musculoskeletal: B/L LE unna boots with edema. No calf tenderness Skin: No cyanosis. No rashes Psychiatry: Judgement and insight appear normal. Mood & affect appropriate.   Assessment/Plan:  V-fib arrest Ischemic cardiomyopathy Acute on chronic systolic heart failure Per cardiology  AKI Stabilized. Worsened secondary to diuresis.  Subclinical hypothyroidism Repeat TSH and free T4 in 4-6 weeks as an outpatient.  OSA Continue CPAP  Diabetes mellitus type 2 Continue Jardiance, Ozempic as an outpatient.  Gout Continue allopurinol. No adjustment needed for renal function. Continue colchicine as needed.  GERD Continue Protonix.  Obesity Estimated body mass index is 37.54 kg/m as calculated from the following:   Height as of this encounter: 6\' 4"  (1.93 m).   Weight as of this encounter: 139.9 kg.   Discussed with cardiology. Patient to transfer to heart failure service. No further TRH needs at this time. Please re-consult [place] as needed.  Jacquelin Hawking, MD [facility] 04/11/2023, 3:01 PM

## 2023-04-11 NOTE — TOC Progression Note (Signed)
Transition of Care [facility]) - Progression Note    Patient Details  Name: Joe [name] MRN: [id number] Date of Birth: Sep 12, 1970  Transition of Care [facility]) CM/SW Contact  Nicanor Bake Phone Number: [phone] 04/11/2023, 2:57 PM  Clinical Narrative: HF CSW met with pt and family at bedside. Pt stated that he currently lives with spouse and pets. Pt stated that he has a scale. Pt stated that he has no history of HH services. Pt stated that he has history with a CPAP machine, but currently does not use one. Pt stated that in some ways his health improved such as weight loss. CSW stated that a follow up appointment will be scheduled with PCP. Pt stated that he already has an appointment. CSW called to confirm pt has an appointment on Monday, April 16, 2023 at 2:45 PM.   TOC will continue following.       Expected Discharge Plan: Home/Self Care Barriers to Discharge: Continued Medical Work up  Expected Discharge Plan and Services       Living arrangements for the past 2 months: Single Family Home                                       Social Determinants of Health (SDOH) Interventions SDOH Screenings   Food Insecurity: No Food Insecurity (04/08/2023)  Housing: Low Risk  (04/08/2023)  Transportation Needs: No Transportation Needs (04/08/2023)  Utilities: Not At Risk (04/08/2023)  Social Connections: Unknown (10/28/2021)   Received from [facility]  Tobacco Use: Low Risk  (04/08/2023)    Readmission Risk Interventions     No data to display

## 2023-04-11 NOTE — Plan of Care (Signed)

## 2023-04-11 NOTE — Progress Notes (Signed)
Mobility Specialist Progress Note:   04/11/23 1400  Mobility  Activity Ambulated independently in hallway  Level of Assistance Independent  Assistive Device None  Distance Ambulated (ft) 400 ft  Activity Response Tolerated well  Mobility Referral Yes  $Mobility charge 1 Mobility  Mobility Specialist Start Time (ACUTE ONLY) 1356  Mobility Specialist Stop Time (ACUTE ONLY) 1406  Mobility Specialist Time Calculation (min) (ACUTE ONLY) 10 min    Pre Mobility: 85 HR During Mobility: 89 HR Post Mobility:  88 HR  Pt received in chair, agreeable to mobility. Asymptomatic w/ no complaints throughout. Pt left ambulating in room with call bell near and all needs met. RN present.  D'Vante Earlene Plater Mobility Specialist Please contact via Special educational needs teacher or Rehab office at [phone]

## 2023-04-11 NOTE — Progress Notes (Signed)
Advanced Heart Failure Rounding Note  PCP-Cardiologist: [name], MD   Subjective:    Rhythm stable. No further VT.   Continues to diurese well, additional 4.3L in UOP yesterday, net negative 3.5 L for the day and negative 10.6L this admit. Wt down 14 lb. Today's wt was charted incorrect at 308 lb however pt states that standing wt this morning was 301 lb.   SCr continues to improve, 1.87>>1.65>>1.57  K 3.8 Mg 2.9   Overall feels much better. Breathing and LEE improved. Only complaint is constipation.     Objective:   Weight Range: (!) 139.9 kg Body mass index is 37.54 kg/m.   Vital Signs:   Temp:  [97.6 F (36.4 C)-98.6 F (37 C)] 98.3 F (36.8 C) (10/23 0721) Pulse Rate:  [71-81] 73 (10/23 0721) Resp:  [18-20] 18 (10/23 0721) BP: (96-105)/(69-73) 105/72 (10/23 0721) SpO2:  [93 %-98 %] 94 % (10/23 0721) Weight:  [139.8 kg-139.9 kg] 139.9 kg (10/23 0427) Last BM Date : 04/09/23  Weight change: Filed Weights   04/10/23 0537 04/10/23 1947 04/11/23 0427  Weight: (!) 137.8 kg (!) 139.8 kg (!) 139.9 kg    Intake/Output:   Intake/Output Summary (Last 24 hours) at 04/11/2023 1003 Last data filed at 04/11/2023 0957 Gross per 24 hour  Intake 360 ml  Output 6100 ml  Net -5740 ml      Physical Exam    General:  Well appearing. No respiratory difficulty HEENT: normal Neck: supple. JVD 7 cm. Carotids 2+ bilat; no bruits. No lymphadenopathy or thyromegaly appreciated. Cor: PMI nondisplaced. Regular rate & rhythm. No rubs, gallops or murmurs. Lungs: decreased BS at the bases  Abdomen: soft, nontender, nondistended. No hepatosplenomegaly. No bruits or masses. Good bowel sounds. Extremities: no cyanosis, clubbing, rash, trace b/l LE edema +unna boots  Neuro: alert & oriented x 3, cranial nerves grossly intact. moves all 4 extremities w/o difficulty. Affect pleasant.   Telemetry   V paced, No further VT   EKG    No new EKG to review   Labs     CBC Recent Labs    04/08/23 1819 04/09/23 0433  WBC 7.8 7.2  NEUTROABS 5.5 5.1  HGB 14.0 13.5  HCT 39.7 39.8  MCV 89.0 91.1  PLT 229 228   Basic Metabolic Panel Recent Labs    78/29/56 1216 04/11/23 0407  NA 140 140  K 3.1* 3.8  CL 93* 98  CO2 34* 34*  GLUCOSE 123* 127*  BUN 20 18  CREATININE 1.65* 1.57*  CALCIUM 9.7 9.4  MG 2.9* 2.9*  PHOS  --  4.7*   Liver Function Tests Recent Labs    04/08/23 1819 04/09/23 0433  AST 28 24  ALT 29 25  ALKPHOS 128* 114  BILITOT 1.2 1.2  PROT 7.1 6.6  ALBUMIN 3.3* 3.1*   No results for input(s): "LIPASE", "AMYLASE" in the last 72 hours. Cardiac Enzymes No results for input(s): "CKTOTAL", "CKMB", "CKMBINDEX", "TROPONINI" in the last 72 hours.  BNP: BNP (last 3 results) Recent Labs    03/09/23 1243 03/28/23 2205 04/08/23 1820  BNP 221.8* 178.3* 182.4*    ProBNP (last 3 results) Recent Labs    05/10/22 0905 08/10/22 1534  PROBNP 475* 351*     D-Dimer No results for input(s): "DDIMER" in the last 72 hours. Hemoglobin A1C No results for input(s): "HGBA1C" in the last 72 hours. Fasting Lipid Panel No results for input(s): "CHOL", "HDL", "LDLCALC", "TRIG", "CHOLHDL", "LDLDIRECT" in the last  72 hours. Thyroid Function Tests Recent Labs    04/10/23 1216  TSH 5.393*  T3FREE 2.7    Other results:   Imaging    No results found.   Medications:     Scheduled Medications:  allopurinol  100 mg Oral Daily   amiodarone  400 mg Oral BID   aspirin EC  81 mg Oral Daily   empagliflozin  10 mg Oral QAC breakfast   furosemide  80 mg Intravenous BID   gabapentin  100 mg Oral BID   insulin aspart  0-9 Units Subcutaneous TID WC   magnesium oxide  400 mg Oral Daily   mexiletine  250 mg Oral BID   pantoprazole  40 mg Oral Daily   polyethylene glycol  17 g Oral Daily   potassium chloride  40 mEq Oral BID   ranolazine  500 mg Oral BID   rosuvastatin  40 mg Oral Daily   senna  1 tablet Oral QHS    spironolactone  12.5 mg Oral Daily    Infusions:   PRN Medications: acetaminophen **OR** acetaminophen, melatonin    Patient Profile   52 y.o. male with h/o morbid obesity, DM2, HTN, HL, OSA, CAD and systolic HF (EF 20-25%), currently undergoing transplant evaluation at [facility], admitted w/ VT and ICD shocks, in the setting of hypokalemia and a/c systolic heart failure w/ volume overload.   Assessment/Plan   1. VT Arrest  - recurrent episodes, 2nd admit in the last 2 wks - admitted 10/10-10/13 w/ VF treated w/ ICD shock>>loaded w/ IV amio  - readmitted now w/ VT arrest, treated w/ appropriate ICD shock x 2 + brief bystander CPR  - in setting of underling CM w/ EF < 35% + hypokalemia (K 2.5) - K initially repleated but need to follow w/ ongoing need for IV diuretics. K 3.8 today, Mg 2.6  - no further VT on tele     - aggressive K supp. Keep K > 4.0 and Mg > 2.0  - PO amio per EP, 400 mg bid  - Mexiletine 250 mg bid    2 Acute on Chronic Systolic HF - due to iCM - Echo (8/20): EF 20-25%  - Echo (9/22): 35-40% - Echo (6/23): EF 30-35% - Echo (8/24): EF 20-25%, RV ok. - R/LHC (9/24): stable 3v CAD, elevated biventricular filling pressures with low PAPi; CI 2.2 - He appears to have end-stage HF. With poor RV function, not a VAD candidate. BMI may prohibit transplant consideration. He has been referred to [facility] for transplant work up. Was seen by Dr. Edwena Blow on 10/14. Plan is to further discuss candidacy w/ transplant team later this week  - NYHA Class II-IIb on admit w/ volume overload. He is diuresing well w/ IV Lasix. Volume improving and near euvolemic. Just received am dose of IV Lasix.  I don't think he will need any further doses today - will transition back to torsemide tomorrow  - if we decide to place him back on metolazone, he will need to take extra K supp on metolazone days  - Continue to hold Entresto for now w/ AKI and soft BP  - Continue spiro 12.5 mg daily. Increase to  25 mg tomorrow    - Continue Jardiance 10 mg daily. - follow BMP daily    3. CAD with chronic stable angina - s/p multiple stents to LAD and LCX - Cath 9/22 stable CAD - LHC (9/24) with stable 3v CAD - denies CP  - Continue  ASA. - Imdur held on admit w/ soft BP  - Continue Ranexa 500 mg bid.  - On high-intensity statin.   4. AKI  - SCr elevated at 1.9 (b/l 1.2), this is likely 2/2 renal venous congestion from volume overload +/- possible transient hypotension w/ brief cardiac arrest - improving w/ diuresis - avoid hypotension, limited HF GDMT for now - follow BMP    5. DM2 - A1c 7.2 (1/24) - Continue SGLT2i   6. OSA - Not using CPAP, says machine was recalled a while ago - Follows with Dr. Mayford Knife - will need updated sleep study for new CPAP    7. Obesity - Body mass index is 38.75 kg/m. - Has struggled with weight loss - Continue Ozempic, may have better success with tirzepatide.   8. Frequent PVCs - On mexilitene 250 mg bid - Zio 5/22 PVC burden 5.4%.  - Followed by Dr. Elberta Fortis - Continue current management - keep K > 4.0 and Mg > 2.0    Discussed care plan w/ wife via phone. She has talked to [facility] transplant coordinator this morning. They have requested he get FOBT for colon cancer screening. I have ordered.    Length of Stay: [address] PA-C  04/11/2023, 10:03 AM  Advanced Heart Failure Team Pager [phone] (M-F; 7a - 5p)  Please contact [facility] for night-coverage after hours (5p -7a ) and weekends on [email or web address]

## 2023-04-12 ENCOUNTER — Other Ambulatory Visit (HOSPITAL_COMMUNITY): Payer: Self-pay

## 2023-04-12 DIAGNOSIS — I5023 Acute on chronic systolic (congestive) heart failure: Secondary | ICD-10-CM | POA: Diagnosis not present

## 2023-04-12 LAB — BASIC METABOLIC PANEL
Anion gap: 10 (ref 5–15)
BUN: 18 mg/dL (ref 6–20)
CO2: 26 mmol/L (ref 22–32)
Calcium: 8.8 mg/dL — ABNORMAL LOW (ref 8.9–10.3)
Chloride: 103 mmol/L (ref 98–111)
Creatinine, Ser: 1.49 mg/dL — ABNORMAL HIGH (ref 0.61–1.24)
GFR, Estimated: 56 mL/min — ABNORMAL LOW (ref >60)
Glucose, Bld: 156 mg/dL — ABNORMAL HIGH (ref 70–99)
Potassium: 3.7 mmol/L (ref 3.5–5.1)
Sodium: 139 mmol/L (ref 135–145)

## 2023-04-12 LAB — GLUCOSE, CAPILLARY
Glucose-Capillary: 175 mg/dL — ABNORMAL HIGH (ref 70–99)
Glucose-Capillary: 158 mg/dL — ABNORMAL HIGH (ref 70–99)

## 2023-04-12 LAB — MAGNESIUM: Magnesium: 2.8 mg/dL — ABNORMAL HIGH (ref 1.7–2.4)

## 2023-04-12 LAB — PHOSPHORUS: Phosphorus: 4.4 mg/dL (ref 2.5–4.6)

## 2023-04-12 MED ORDER — POTASSIUM CHLORIDE CRYS ER 20 MEQ PO TBCR
40.0000 meq | EXTENDED_RELEASE_TABLET | Freq: Three times a day (TID) | ORAL | 1 refills | Status: DC
  Filled 2023-04-12: qty 540, 90d supply, fill #0

## 2023-04-12 MED ORDER — TORSEMIDE 20 MG PO TABS
60.0000 mg | ORAL_TABLET | Freq: Two times a day (BID) | ORAL | Status: DC
  Administered 2023-04-12: 60 mg via ORAL
  Filled 2023-04-12: qty 3

## 2023-04-12 MED ORDER — MAGNESIUM OXIDE -MG SUPPLEMENT 400 (240 MG) MG PO TABS
400.0000 mg | ORAL_TABLET | Freq: Every day | ORAL | 5 refills | Status: DC
  Filled 2023-04-12: qty 30, 30d supply, fill #0

## 2023-04-12 MED ORDER — POTASSIUM CHLORIDE CRYS ER 20 MEQ PO TBCR: 40.0000 meq | EXTENDED_RELEASE_TABLET | Freq: Three times a day (TID) | ORAL | Status: DC

## 2023-04-12 MED ORDER — AMIODARONE HCL 200 MG PO TABS
ORAL_TABLET | ORAL | 0 refills | Status: DC
  Filled 2023-04-12: qty 90, 38d supply, fill #0

## 2023-04-12 MED ORDER — TORSEMIDE 20 MG PO TABS
60.0000 mg | ORAL_TABLET | Freq: Two times a day (BID) | ORAL | 1 refills | Status: DC
  Filled 2023-04-12: qty 540, 90d supply, fill #0

## 2023-04-12 MED ORDER — SPIRONOLACTONE 25 MG PO TABS
25.0000 mg | ORAL_TABLET | Freq: Every day | ORAL | 5 refills | Status: DC
  Filled 2023-04-12: qty 90, 90d supply, fill #0

## 2023-04-12 NOTE — Progress Notes (Signed)
   04/12/23 0012  BiPAP/CPAP/SIPAP  Reason BIPAP/CPAP not in use Non-compliant (refused)

## 2023-04-12 NOTE — Progress Notes (Signed)
Advanced Heart Failure Rounding Note  PCP-Cardiologist: [name], MD   Subjective:    Rhythm stable. No further VT.   Volume much improved, euvolemic. Breathing improved. No resting dyspnea.   SCr continues to improve, 1.87>>1.65>>1.57 >>1.49  K 3.7 Mg 2.8   FOBT negative      Objective:   Weight Range: (!) 138.6 kg Body mass index is 37.2 kg/m.   Vital Signs:   Temp:  [97.6 F (36.4 C)-98.4 F (36.9 C)] 97.6 F (36.4 C) (10/24 0723) Pulse Rate:  [69-74] 69 (10/24 0723) Resp:  [16-19] 16 (10/24 0723) BP: (94-110)/(66-75) 101/67 (10/24 0723) SpO2:  [95 %-97 %] 97 % (10/24 0723) Weight:  [136.8 kg-138.6 kg] 138.6 kg (10/24 0515) Last BM Date : 04/11/23  Weight change: Filed Weights   04/11/23 0427 04/11/23 1624 04/12/23 0515  Weight: (!) 139.9 kg (!) 136.8 kg (!) 138.6 kg    Intake/Output:   Intake/Output Summary (Last 24 hours) at 04/12/2023 0858 Last data filed at 04/12/2023 0723 Gross per 24 hour  Intake 2479 ml  Output 4300 ml  Net -1821 ml      Physical Exam    General:  Well appearing. No respiratory difficulty HEENT: normal Neck: supple. no JVD 8 cm. Carotids 2+ bilat; no bruits. No lymphadenopathy or thyromegaly appreciated. Cor: PMI nondisplaced. Regular rate & rhythm. No rubs, gallops or murmurs. Lungs: clear Abdomen: soft, nontender, nondistended. No hepatosplenomegaly. No bruits or masses. Good bowel sounds. Extremities: no cyanosis, clubbing, rash, edema Neuro: alert & oriented x 3, cranial nerves grossly intact. moves all 4 extremities w/o difficulty. Affect pleasant.    Telemetry   V paced, No further VT   EKG    No new EKG to review   Labs    CBC No results for input(s): "WBC", "NEUTROABS", "HGB", "HCT", "MCV", "PLT" in the last 72 hours.  Basic Metabolic Panel Recent Labs    16/10/96 0407 04/12/23 0509  NA 140 139  K 3.8 3.7  CL 98 103  CO2 34* 26  GLUCOSE 127* 156*  BUN 18 18  CREATININE 1.57*  1.49*  CALCIUM 9.4 8.8*  MG 2.9* 2.8*  PHOS 4.7* 4.4   Liver Function Tests No results for input(s): "AST", "ALT", "ALKPHOS", "BILITOT", "PROT", "ALBUMIN" in the last 72 hours.  No results for input(s): "LIPASE", "AMYLASE" in the last 72 hours. Cardiac Enzymes No results for input(s): "CKTOTAL", "CKMB", "CKMBINDEX", "TROPONINI" in the last 72 hours.  BNP: BNP (last 3 results) Recent Labs    03/09/23 1243 03/28/23 2205 04/08/23 1820  BNP 221.8* 178.3* 182.4*    ProBNP (last 3 results) Recent Labs    05/10/22 0905 08/10/22 1534  PROBNP 475* 351*     D-Dimer No results for input(s): "DDIMER" in the last 72 hours. Hemoglobin A1C No results for input(s): "HGBA1C" in the last 72 hours. Fasting Lipid Panel No results for input(s): "CHOL", "HDL", "LDLCALC", "TRIG", "CHOLHDL", "LDLDIRECT" in the last 72 hours. Thyroid Function Tests Recent Labs    04/10/23 1216  TSH 5.393*  T3FREE 2.7    Other results:   Imaging    No results found.   Medications:     Scheduled Medications:  allopurinol  100 mg Oral Daily   amiodarone  400 mg Oral BID   aspirin EC  81 mg Oral Daily   empagliflozin  10 mg Oral QAC breakfast   gabapentin  100 mg Oral BID   insulin aspart  0-9 Units Subcutaneous TID WC  magnesium oxide  400 mg Oral Daily   mexiletine  250 mg Oral BID   pantoprazole  40 mg Oral Daily   polyethylene glycol  17 g Oral TID   potassium chloride  40 mEq Oral BID   ranolazine  500 mg Oral BID   rosuvastatin  40 mg Oral Daily   senna-docusate  2 tablet Oral BID   spironolactone  25 mg Oral Daily    Infusions:   PRN Medications: acetaminophen **OR** acetaminophen, melatonin    Patient Profile   52 y.o. male with h/o morbid obesity, DM2, HTN, HL, OSA, CAD and systolic HF (EF 20-25%), currently undergoing transplant evaluation at [facility], admitted w/ VT and ICD shocks, in the setting of hypokalemia and a/c systolic heart failure w/ volume overload.    Assessment/Plan   1. VT Arrest  - recurrent episodes, 2nd admit in the last 2 wks - admitted 10/10-10/13 w/ VF treated w/ ICD shock>>loaded w/ IV amio  - readmitted now w/ VT arrest, treated w/ appropriate ICD shock x 2 + brief bystander CPR  - in setting of underling CM w/ EF < 35% + hypokalemia (K 2.5) - K repleted, 3.7 today, Mg 2.8  - no further VT on tele     - aggressive K supp. Keep K > 4.0 and Mg > 2.0  - PO amio per EP, 400 mg bid x 7 days>>400 mg daily thereafter until EP f/u - Mexiletine 250 mg bid    2 Acute on Chronic Systolic HF - due to iCM - Echo (8/20): EF 20-25%  - Echo (9/22): 35-40% - Echo (6/23): EF 30-35% - Echo (8/24): EF 20-25%, RV ok. - R/LHC (9/24): stable 3v CAD, elevated biventricular filling pressures with low PAPi; CI 2.2 - He appears to have end-stage HF. With poor RV function, not a VAD candidate. BMI may prohibit transplant consideration. He has been referred to [facility] for transplant work up. Was seen by Dr. Edwena Blow on 10/14. Plan is to further discuss candidacy w/ transplant team later this week  - NYHA Class II-IIb on admit w/ volume overload. He diuresed well w/ IV Lasix. Volume improved, euvolemic.  - will transition back to torsemide 60 mg bid (40 mg tid PTA) - Stop metolazone - can use Furoscix at home PNR (has home supply)  - Continue to hold Entresto for now w/ soft BP  - Continue spiro 25 mg daily  - Continue Jardiance 10 mg daily. - [facility] requested FOBT for colon cancer screening. This was completed and negative    3. CAD with chronic stable angina - s/p multiple stents to LAD and LCX - Cath 9/22 stable CAD - LHC (9/24) with stable 3v CAD - denies CP  - Continue ASA. - Imdur held on admit w/ soft BP  - Continue Ranexa 500 mg bid.  - On high-intensity statin.   4. AKI  - SCr elevated at 1.9 (b/l 1.2), this is likely 2/2 renal venous congestion from volume overload +/- possible transient hypotension w/ brief cardiac arrest - improving  w/ diuresis - avoid hypotension, limited HF GDMT for now - follow BMP    5. DM2 - A1c 7.2 (1/24) - Continue SGLT2i   6. OSA - Not using CPAP, says machine was recalled a while ago - Follows with Dr. Mayford Knife - will need updated sleep study for new CPAP    7. Obesity - Body mass index is 38.75 kg/m. - Has struggled with weight loss - Continue Ozempic, may  have better success with tirzepatide.   8. Frequent PVCs - On mexilitene 250 mg bid - Zio 5/22 PVC burden 5.4%.  - Followed by Dr. Elberta Fortis - Continue current management - keep K > 4.0 and Mg > 2.0    Plan d/c home today. Has post hospital f/u in the [facility] arranged for next wk.    Length of Stay: [address], PA-C  04/12/2023, 8:58 AM  Advanced Heart Failure Team Pager [phone] (M-F; 7a - 5p)  Please contact [facility] for night-coverage after hours (5p -7a ) and weekends on [email or web address]

## 2023-04-15 ENCOUNTER — Other Ambulatory Visit (HOSPITAL_COMMUNITY): Payer: Self-pay | Admitting: Family Medicine

## 2023-04-16 ENCOUNTER — Ambulatory Visit (INDEPENDENT_AMBULATORY_CARE_PROVIDER_SITE_OTHER): Payer: BC Managed Care – PPO

## 2023-04-16 DIAGNOSIS — I255 Ischemic cardiomyopathy: Secondary | ICD-10-CM

## 2023-04-16 DIAGNOSIS — I5022 Chronic systolic (congestive) heart failure: Secondary | ICD-10-CM

## 2023-04-17 NOTE — H&P (View-Only) (Signed)
 ADVANCED HF CLINIC NOTE  PCP: Lonie Peak, PA-C Primary Cardiologist: Dr. Bing Matter  HF Cardiologist: Dr. Gala Romney  HPI: Joe [name] is a 52 y.o. male with h/o morbid obesity, DM2, HTN, HL, OSA, CAD and systolic HF (EF 20-25%) referred by Dr. Bing Matter for further management of his HF   Has h/o RBBB but never had any other heart problems until 3/19. Saw Dr. Mauri Brooklyn in 3/19 due to CP and SOB. Cath EF 25-35% Severe diabetic CAD with LAD disease (m90 and m75),  RCA d50, LCA m50.   Echo 7/20 EF 20-25% moderate RV dysfunction  Cath January 2020 which showed 40% distal LAD, LAD/LCx stents were patent, mid RCA got 25% lesion right PDA had 50% stenosis the circumflex artery were open.  No intervention has been required.    Had cath 1/22 with stable CAD  Myoview 12/19 EF 30% Apical and infero-apical scar  Had St. Jude CRT-D placed 03/19/19 with Dr. Elberta Fortis.  Admitted 9/22 for CP. HsTn was 4. Underwent cardiac cath on 03/07/2021 which showed patent stents in the LAD and LCX with diffuse distal LAD which may be a trigger for angina as well as mildly elevated RA pressure with normal PCWP and LVEDP and preserved cardiac output.   Echo 9/22 EF 35-40%  Admitted 6/23 with CP. Hstrop 35. Felt to be non-cardiac. Echo EF 30-35%  Seen in ED 04/10/22 with SOB, found to be volume overloaded. BNP only 172. Hst trop 37->47. CXR with pulmonary edema. Given IV lasix with > 2L out. Refused hospital admission, had been in [facility] x to get fluids back.  He's had recurrent volume overload over the last few months requiring frequent diuretic adjustments. NYHA IIIb/IV.   Follow up 8/24, he was markedly volume overloaded. ReDS 47%. Declined admission. Torsemide increased to 80 mg daily and instructed to take 5 mg metolazone daily X 3 days. Coreg stopped. Echo with EF 20-25%. Close follow up 9/24, down 33 lbs but still with volume overload and NYHA IIIb symptoms. Discussion had about work up for  advanced therapies. Given age favor transplant eval but BMI may be prohibitive currently, and VAD may be better option.   Underwent R/LHC (9/24) showing stable 3v CAD, severe biventricular failure with elevated filling pressures and very low PAPi suggestive of poor RV function. Not VAD candidate with poor RV function. Torsemide increased to 40 bid, and weekly metolazone added.   Admitted 10/10 - 04/01/23 for ICD shock in setting of VF. Loaded on IV amiodarone.    Seen at [facility] on 04/02/23 for transplant evaluation. CXP was done. Plan, per pt report, is for [facility] to discuss candidacy soon.   Readmitted for VT treated w/ ICD shock. He apparently briefly lost consciousness and had brief chest compressions by his brother-in law. Regained consciousness after his ICD fired twice. K 2.5 on admit.  Weight up 15 lbs above baseline.  Lytes repleted, diuresed and GDMT titrated. Discharged home, weight 305 lbs.  Today he returns for post hospital HF follow up with his wife. Overall feeling good. He has SOB walking up hills. Feels like he still has some fluid on board but overall much better. Mild  positional dizziness, no syncope or falls. Denies palpitations, CP,edema, or PND/Orthopnea. Appetite ok. No fever or chills. Weight at home 300 pounds. Taking all medications. Labs on Monday showed K 4.4. Wife says [facility] would like him to get VAD here, lose 25 lbs, then re-refer to transplant.    Cardiac Studies  - CPX  at [facility] (10/24): Mild to moderate HF limitation FVC    5.73 L           3.59 L    63%  FEV1   4.45 L          2.69 L    60%  FEV1/FVC   78%     75.00 %  PEF   11.14 L/s        6.60 L/s  59%  MVV  150.00 L/min   92.00 L/min   Peak VO2 13.8 60% predicted VE-VCO2 slope 37.2  - R/LHC (9/24): stable 3v CAD RA 18, PA 41/35 (36), PCWP 27, CO/CI (Fick) 5.8/2.2, PVR 1.6 WU, PAPi 0.33  - Echo (8/24): EF 20 -25%  - Echo (6/23): EF 30-35%  - Echo (9/22): EF 35-40%  - R/LHC (9/22): patent stents in the  LAD and LCX with diffuse distal LAD, which may have been trigger for angina. RA mean 11 PA 36/7 (mean 12) PCWP mean 12 CO/CI (Fick) 6.54/2.33 PVR 1.2 WU  - LHC (1/22): stable CAD  - LHC (1/20): 40% distal LAD, LAD/LCx stents were patent, mid RCA got 25% lesion right PDA had 50% stenosis the circumflex artery were open.  No intervention has been required.    - Echo (7/20): EF 20-25% moderate RV dysfunction  - Cath (3/19): EF 25-35% Severe diabetic CAD with LAD disease (m90 and m75),  RCA d50, LCA m50.   Past Medical History:  Diagnosis Date   Abnormal EKG    Acute systolic heart failure (HCC) 09/06/2017   Angina pectoris (HCC) 09/05/2017   Body mass index 45.0-49.9, adult (HCC)    CAD S/P percutaneous coronary angioplasty 09/07/2017   mLAD and dLAD PCI with DES 09/06/17   CHF (congestive heart failure) (HCC)    Chronic systolic (congestive) heart failure (HCC) 03/19/2019   Complication of anesthesia 1995   "had hard time waking up; breathing w/vasectomy"   Coronary artery disease    Erectile dysfunction    Essential hypertension, benign    Fatigue    GERD (gastroesophageal reflux disease)    Gout    High cholesterol    "just started tx yesterday" (09/06/2017)   History of gout    "RX prn" (09/06/2017)   Ischemic cardiomyopathy 09/07/2017   EF 25-35%   Muscular chest pain    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   Below Left Nare (MOH's)   Obesity    Obstructive sleep apnea    OSA on CPAP    Plantar fasciitis    Pre-operative clearance 02/11/2018   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/02/2019   Saint Jude   Right bundle branch block    Shortness of breath 09/05/2017   Testosterone deficiency    Type 2 diabetes mellitus with complication, without long-term current use of insulin (HCC) 09/05/2017   Type II diabetes mellitus (HCC)    "started tx 08/28/2017"   Current Outpatient Medications  Medication Sig Dispense Refill    acetaminophen (TYLENOL) 500 MG tablet Take 1,000 mg by mouth every 6 (six) hours as needed for moderate pain or headache.     allopurinol (ZYLOPRIM) 100 MG tablet Take 100 mg by mouth daily.     amiodarone (PACERONE) 200 MG tablet Take 2 tablets (400 mg total) by mouth 2 (two) times daily for 7 days, THEN 1 tablet (200 mg total) 2 (two) times daily. 90 tablet 0   Aromatic Inhalants (VICKS VAPOINHALER) INHA Inhale 1 puff into  the lungs daily as needed (congestion).     aspirin EC 81 MG tablet Take 1 tablet (81 mg total) by mouth daily. 90 tablet 3   colchicine 0.6 MG tablet Take 0.6 mg by mouth daily as needed (Gout).     diclofenac Sodium (VOLTAREN) 1 % GEL APPLY 1 G TOPICALLY DAILY. (Patient taking differently: Apply 2 g topically daily as needed (for pain).) 100 g 0   empagliflozin (JARDIANCE) 10 MG TABS tablet Take 1 tablet (10 mg total) by mouth daily before breakfast. 30 tablet 11   escitalopram (LEXAPRO) 10 MG tablet Take 10 mg by mouth at bedtime.     gabapentin (NEURONTIN) 100 MG capsule Take 100-200 mg by mouth 2 (two) times daily.     magnesium oxide (MAG-OX) 400 (240 Mg) MG tablet Take 1 tablet (400 mg total) by mouth daily. 30 tablet 5   mexiletine (MEXITIL) 250 MG capsule Take 1 capsule (250 mg total) by mouth 2 (two) times daily. 60 capsule 11   nitroGLYCERIN (NITROSTAT) 0.4 MG SL tablet Place 1 tablet (0.4 mg total) under the tongue every 5 (five) minutes as needed for chest pain. 25 tablet 3   omeprazole (PRILOSEC) 40 MG capsule Take 1 capsule (40 mg total) by mouth daily. 90 capsule 1   OZEMPIC, 1 MG/DOSE, 4 MG/3ML SOPN Inject 1 mg into the skin every Monday.     Polyethylene Glycol 400 (BLINK TEARS OP) Place 2 drops into both eyes 4 (four) times daily as needed (for dry eyes).     potassium chloride SA (KLOR-CON M) 20 MEQ tablet Take 2 tablets (40 mEq total) by mouth 3 (three) times daily. 540 tablet 1   ranolazine (RANEXA) 500 MG 12 hr tablet Take 1 tablet (500 mg total) by mouth  2 (two) times daily. 180 tablet 2   rosuvastatin (CRESTOR) 40 MG tablet Take 1 tablet (40 mg total) by mouth daily. (Patient taking differently: Take 40 mg by mouth at bedtime.) 90 tablet 2   spironolactone (ALDACTONE) 25 MG tablet Take 1 tablet (25 mg total) by mouth daily. 30 tablet 5   torsemide (DEMADEX) 20 MG tablet Take 3 tablets (60 mg total) by mouth 2 (two) times daily. 540 tablet 1   triamcinolone cream (KENALOG) 0.1 % Apply 1 Application topically 2 (two) times daily.     No current facility-administered medications for this visit.   No Known Allergies  Social History   Socioeconomic History   Marital status: Married    Spouse name: Not on file   Number of children: Not on file   Years of education: Not on file   Highest education level: Not on file  Occupational History   Not on file  Tobacco Use   Smoking status: Never   Smokeless tobacco: Never  Vaping Use   Vaping status: Never Used  Substance and Sexual Activity   Alcohol use: No   Drug use: Never   Sexual activity: Not on file  Other Topics Concern   Not on file  Social History Narrative   Not on file   Social Determinants of Health   Financial Resource Strain: Not on file  Food Insecurity: No Food Insecurity (04/08/2023)   Hunger Vital Sign    Worried About Running Out of Food in the Last Year: Never true    Ran Out of Food in the Last Year: Never true  Transportation Needs: No Transportation Needs (04/08/2023)   PRAPARE - Transportation    Lack of Transportation (  Medical): No    Lack of Transportation (Non-Medical): No  Physical Activity: Not on file  Stress: Not on file  Social Connections: Unknown (10/28/2021)   [facility] from [facility], [facility]   Social Network    Social Network: Not on file  Intimate Partner Violence: Not At Risk (04/08/2023)   Humiliation, Afraid, Rape, and Kick questionnaire    Fear of Current or Ex-Partner: No    Emotionally Abused: No    Physically Abused: No     Sexually Abused: No   Family History  Problem Relation Age of Onset   Lung cancer Mother    Brain cancer Mother    Lymphoma Father    Heart disease Brother    Heart attack Brother    Diabetes Mellitus II Brother    Diabetes Mellitus II Paternal Grandfather    There were no vitals taken for this visit.  Wt Readings from Last 3 Encounters:  04/12/23 (!) 138.6 kg (305 lb 9.6 oz)  03/30/23 (!) 139.6 kg (307 lb 12.8 oz)  03/09/23 (!) 140.6 kg (310 lb)   PHYSICAL EXAM: General:  NAD. No resp difficulty, walked into clinic HEENT: Normal Neck: Supple. No JVD, thick neck. Carotids 2+ bilat; no bruits. No lymphadenopathy or thryomegaly appreciated. Cor: PMI nondisplaced. Regular rate & rhythm. No rubs, gallops or murmurs. Lungs: Clear Abdomen: Soft, obese, nontender, nondistended. No hepatosplenomegaly. No bruits or masses. Good bowel sounds. Extremities: No cyanosis, clubbing, rash, trace BLE edema Neuro: Alert & oriented x 3, cranial nerves grossly intact. Moves all 4 extremities w/o difficulty. Affect pleasant.  ECG (personally reviewed): A sensed, V paced 64 bpm  REDs: 31%  Device interrogation (personally reviewed): CorVue suggests mild volume overload but thoracic impedence is at reference line, 94% BiV paced, no further VT, no AF.  ASSESSMENT & PLAN: 1. VT Arrest  - recurrent episodes, 2nd admit in the last 2 wks - Admitted 10/10-10/13/24 w/ VF treated w/ ICD shock>>loaded w/ IV amio  - Re-admitted now w/ VT arrest, treated w/ appropriate ICD shock x 2 + brief bystander CPR  - in setting of underling CM w/ EF < 35% + hypokalemia (K 2.5) - Continue amiodarone 400 mg daily until EP follow up - Continue mexiletine 250 mg bid - No VT on device interrogation. - Check BMET and Mag today    2  Chronic Systolic HF - due to iCM - Echo (8/20): EF 20-25%  - Echo (9/22): 35-40% - Echo (6/23): EF 30-35% - Echo (8/24): EF 20-25%, RV ok. - R/LHC (9/24): stable 3v CAD, elevated  biventricular filling pressures with low PAPi; CI 2.2 - He appears to have end-stage HF. With poor RV function, not a VAD candidate. BMI may prohibit transplant consideration. He has been referred to [facility] for transplant work up. Was seen by Dr. Edwena Blow on 04/02/23. Plan is to further discuss candidacy w/ transplant team. - NYHA IIIB-IV. Volume ok today on device interrogation and ReDs 31%. Soft BP limits GDMT today. - Continue torsemide 60 mg bid. - Avoid metolazone - can use Furoscix at home PRN (has home supply).  - Off Entresto with soft BP - Continue spiro 25 mg daily  - Continue Jardiance 10 mg daily. - Labs at PCP on Monday showed K 4.4, requested labs today. - Dr. Gala Romney discussed with [facility]. Plan to work up for VAD here, with eye toward transplant down the road if he is able to get weight down. - Arrange RHC soon with Dr. Gala Romney to  get updated hemodynamics. - Get ltd echo.   3. CAD with chronic stable angina - s/p multiple stents to LAD and LCX - Cath 9/22 stable CAD - LHC (9/24) with stable 3v CAD - No chest pain - Continue ASA. - Continue Ranexa 500 mg bid.  - On high-intensity statin. - Off Imdur with soft BP   4. H/o AKI  - SCr 1.2 - avoid hypotension. - Continue SGLT2i - Labs today   5. DM2 - A1c 7.2 (1/24) - Continue SGLT2i   6. OSA - Not using CPAP, says machine was recalled a while ago - will need updated sleep study for new CPAP  - Refer back to Dr. Mayford Knife   7. Obesity - Body mass index is 37.27 kg/m. - Has struggled with weight loss - Continue Ozempic, may have better success with tirzepatide.   8. Frequent PVCs - On mexilitene 250 mg bid - Zio 5/22 PVC burden 5.4%.  - Followed by Dr. Elberta Fortis - Continue current management - Labs today.   Follow up in 3 weeks with APP and 6 weeks with PharmD for GDMT titration and close monitoring of volume. VAD team to see today.  Prince Rome, FNP-BC 04/18/23   Patient seen and examined with the  above-signed Advanced Practice Provider and/or Housestaff. I personally reviewed laboratory data, imaging studies and relevant notes. I independently examined the patient and formulated the important aspects of the plan. I have edited the note to reflect any of my changes or salient points. I have personally discussed the plan with the patient and/or family.  Recently seen at [facility] for transplant eval and felt not to be candidate due to size. Referred back here for VAD  Volume status much improved. Still SOB with just mild exertion. BP soft.   General: Obese male No resp difficulty HEENT: normal Neck: supple. no JVD. Carotids 2+ bilat; no bruits. No lymphadenopathy or thryomegaly appreciated. Cor: . Regular rate & rhythm. No rubs, gallops or murmurs. Lungs: clear Abdomen: Obese soft, nontender, nondistended.Peri [place] bowel sounds. Extremities: no cyanosis, clubbing, rash, edema Neuro: alert & orientedx3, cranial nerves grossly intact. moves all 4 extremities w/o difficulty. Affect pleasant  He has end-stage HF. No transplant candidate due to size. (I discussed personally with [facility] team). Currently optimized from med and volume standpoint   Long discussion about VAD process with him and his wife. They are ready to proceed. Wil have VAD team see and begin w/u.   I spent a total of 45 minutes today: 1) reviewing the patient's medical records including previous charts, labs and recent notes from other providers; 2) examining the patient and counseling them on their medical issues/explaining the plan of care; 3) adjusting meds as needed and 4) ordering lab work or other needed tests.   Arvilla Meres, MD  3:30 PM

## 2023-04-17 NOTE — Progress Notes (Signed)
ADVANCED HF CLINIC NOTE  PCP: Lonie Peak, PA-C Primary Cardiologist: Dr. Bing Matter  HF Cardiologist: Dr. Gala Romney  HPI: Joe [name] is a 52 y.o. male with h/o morbid obesity, DM2, HTN, HL, OSA, CAD and systolic HF (EF 20-25%) referred by Dr. Bing Matter for further management of his HF   Has h/o RBBB but never had any other heart problems until 3/19. Saw Dr. Mauri Brooklyn in 3/19 due to CP and SOB. Cath EF 25-35% Severe diabetic CAD with LAD disease (m90 and m75),  RCA d50, LCA m50.   Echo 7/20 EF 20-25% moderate RV dysfunction  Cath January 2020 which showed 40% distal LAD, LAD/LCx stents were patent, mid RCA got 25% lesion right PDA had 50% stenosis the circumflex artery were open.  No intervention has been required.    Had cath 1/22 with stable CAD  Myoview 12/19 EF 30% Apical and infero-apical scar  Had St. Jude CRT-D placed 03/19/19 with Dr. Elberta Fortis.  Admitted 9/22 for CP. HsTn was 4. Underwent cardiac cath on 03/07/2021 which showed patent stents in the LAD and LCX with diffuse distal LAD which may be a trigger for angina as well as mildly elevated RA pressure with normal PCWP and LVEDP and preserved cardiac output.   Echo 9/22 EF 35-40%  Admitted 6/23 with CP. Hstrop 35. Felt to be non-cardiac. Echo EF 30-35%  Seen in ED 04/10/22 with SOB, found to be volume overloaded. BNP only 172. Hst trop 37->47. CXR with pulmonary edema. Given IV lasix with > 2L out. Refused hospital admission, had been in [facility] x to get fluids back.  He's had recurrent volume overload over the last few months requiring frequent diuretic adjustments. NYHA IIIb/IV.   Follow up 8/24, he was markedly volume overloaded. ReDS 47%. Declined admission. Torsemide increased to 80 mg daily and instructed to take 5 mg metolazone daily X 3 days. Coreg stopped. Echo with EF 20-25%. Close follow up 9/24, down 33 lbs but still with volume overload and NYHA IIIb symptoms. Discussion had about work up for  advanced therapies. Given age favor transplant eval but BMI may be prohibitive currently, and VAD may be better option.   Underwent R/LHC (9/24) showing stable 3v CAD, severe biventricular failure with elevated filling pressures and very low PAPi suggestive of poor RV function. Not VAD candidate with poor RV function. Torsemide increased to 40 bid, and weekly metolazone added.   Admitted 10/10 - 04/01/23 for ICD shock in setting of VF. Loaded on IV amiodarone.    Seen at [facility] on 04/02/23 for transplant evaluation. CXP was done. Plan, per pt report, is for [facility] to discuss candidacy soon.   Readmitted for VT treated w/ ICD shock. He apparently briefly lost consciousness and had brief chest compressions by his brother-in law. Regained consciousness after his ICD fired twice. K 2.5 on admit.  Weight up 15 lbs above baseline.  Lytes repleted, diuresed and GDMT titrated. Discharged home, weight 305 lbs.  Today he returns for post hospital HF follow up with his wife. Overall feeling good. He has SOB walking up hills. Feels like he still has some fluid on board but overall much better. Mild  positional dizziness, no syncope or falls. Denies palpitations, CP,edema, or PND/Orthopnea. Appetite ok. No fever or chills. Weight at home 300 pounds. Taking all medications. Labs on Monday showed K 4.4. Wife says [facility] would like him to get VAD here, lose 25 lbs, then re-refer to transplant.    Cardiac Studies  - CPX  at [facility] (10/24): Mild to moderate HF limitation FVC    5.73 L           3.59 L    63%  FEV1   4.45 L          2.69 L    60%  FEV1/FVC   78%     75.00 %  PEF   11.14 L/s        6.60 L/s  59%  MVV  150.00 L/min   92.00 L/min   Peak VO2 13.8 60% predicted VE-VCO2 slope 37.2  - R/LHC (9/24): stable 3v CAD RA 18, PA 41/35 (36), PCWP 27, CO/CI (Fick) 5.8/2.2, PVR 1.6 WU, PAPi 0.33  - Echo (8/24): EF 20 -25%  - Echo (6/23): EF 30-35%  - Echo (9/22): EF 35-40%  - R/LHC (9/22): patent stents in the  LAD and LCX with diffuse distal LAD, which may have been trigger for angina. RA mean 11 PA 36/7 (mean 12) PCWP mean 12 CO/CI (Fick) 6.54/2.33 PVR 1.2 WU  - LHC (1/22): stable CAD  - LHC (1/20): 40% distal LAD, LAD/LCx stents were patent, mid RCA got 25% lesion right PDA had 50% stenosis the circumflex artery were open.  No intervention has been required.    - Echo (7/20): EF 20-25% moderate RV dysfunction  - Cath (3/19): EF 25-35% Severe diabetic CAD with LAD disease (m90 and m75),  RCA d50, LCA m50.   Past Medical History:  Diagnosis Date   Abnormal EKG    Acute systolic heart failure (HCC) 09/06/2017   Angina pectoris (HCC) 09/05/2017   Body mass index 45.0-49.9, adult (HCC)    CAD S/P percutaneous coronary angioplasty 09/07/2017   mLAD and dLAD PCI with DES 09/06/17   CHF (congestive heart failure) (HCC)    Chronic systolic (congestive) heart failure (HCC) 03/19/2019   Complication of anesthesia 1995   "had hard time waking up; breathing w/vasectomy"   Coronary artery disease    Erectile dysfunction    Essential hypertension, benign    Fatigue    GERD (gastroesophageal reflux disease)    Gout    High cholesterol    "just started tx yesterday" (09/06/2017)   History of gout    "RX prn" (09/06/2017)   Ischemic cardiomyopathy 09/07/2017   EF 25-35%   Muscular chest pain    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   Below Left Nare (MOH's)   Obesity    Obstructive sleep apnea    OSA on CPAP    Plantar fasciitis    Pre-operative clearance 02/11/2018   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/02/2019   Saint Jude   Right bundle branch block    Shortness of breath 09/05/2017   Testosterone deficiency    Type 2 diabetes mellitus with complication, without long-term current use of insulin (HCC) 09/05/2017   Type II diabetes mellitus (HCC)    "started tx 08/28/2017"   Current Outpatient Medications  Medication Sig Dispense Refill    acetaminophen (TYLENOL) 500 MG tablet Take 1,000 mg by mouth every 6 (six) hours as needed for moderate pain or headache.     allopurinol (ZYLOPRIM) 100 MG tablet Take 100 mg by mouth daily.     amiodarone (PACERONE) 200 MG tablet Take 2 tablets (400 mg total) by mouth 2 (two) times daily for 7 days, THEN 1 tablet (200 mg total) 2 (two) times daily. 90 tablet 0   Aromatic Inhalants (VICKS VAPOINHALER) INHA Inhale 1 puff into  the lungs daily as needed (congestion).     aspirin EC 81 MG tablet Take 1 tablet (81 mg total) by mouth daily. 90 tablet 3   colchicine 0.6 MG tablet Take 0.6 mg by mouth daily as needed (Gout).     diclofenac Sodium (VOLTAREN) 1 % GEL APPLY 1 G TOPICALLY DAILY. (Patient taking differently: Apply 2 g topically daily as needed (for pain).) 100 g 0   empagliflozin (JARDIANCE) 10 MG TABS tablet Take 1 tablet (10 mg total) by mouth daily before breakfast. 30 tablet 11   escitalopram (LEXAPRO) 10 MG tablet Take 10 mg by mouth at bedtime.     gabapentin (NEURONTIN) 100 MG capsule Take 100-200 mg by mouth 2 (two) times daily.     magnesium oxide (MAG-OX) 400 (240 Mg) MG tablet Take 1 tablet (400 mg total) by mouth daily. 30 tablet 5   mexiletine (MEXITIL) 250 MG capsule Take 1 capsule (250 mg total) by mouth 2 (two) times daily. 60 capsule 11   nitroGLYCERIN (NITROSTAT) 0.4 MG SL tablet Place 1 tablet (0.4 mg total) under the tongue every 5 (five) minutes as needed for chest pain. 25 tablet 3   omeprazole (PRILOSEC) 40 MG capsule Take 1 capsule (40 mg total) by mouth daily. 90 capsule 1   OZEMPIC, 1 MG/DOSE, 4 MG/3ML SOPN Inject 1 mg into the skin every Monday.     Polyethylene Glycol 400 (BLINK TEARS OP) Place 2 drops into both eyes 4 (four) times daily as needed (for dry eyes).     potassium chloride SA (KLOR-CON M) 20 MEQ tablet Take 2 tablets (40 mEq total) by mouth 3 (three) times daily. 540 tablet 1   ranolazine (RANEXA) 500 MG 12 hr tablet Take 1 tablet (500 mg total) by mouth  2 (two) times daily. 180 tablet 2   rosuvastatin (CRESTOR) 40 MG tablet Take 1 tablet (40 mg total) by mouth daily. (Patient taking differently: Take 40 mg by mouth at bedtime.) 90 tablet 2   spironolactone (ALDACTONE) 25 MG tablet Take 1 tablet (25 mg total) by mouth daily. 30 tablet 5   torsemide (DEMADEX) 20 MG tablet Take 3 tablets (60 mg total) by mouth 2 (two) times daily. 540 tablet 1   triamcinolone cream (KENALOG) 0.1 % Apply 1 Application topically 2 (two) times daily.     No current facility-administered medications for this visit.   No Known Allergies  Social History   Socioeconomic History   Marital status: Married    Spouse name: Not on file   Number of children: Not on file   Years of education: Not on file   Highest education level: Not on file  Occupational History   Not on file  Tobacco Use   Smoking status: Never   Smokeless tobacco: Never  Vaping Use   Vaping status: Never Used  Substance and Sexual Activity   Alcohol use: No   Drug use: Never   Sexual activity: Not on file  Other Topics Concern   Not on file  Social History Narrative   Not on file   Social Determinants of Health   Financial Resource Strain: Not on file  Food Insecurity: No Food Insecurity (04/08/2023)   Hunger Vital Sign    Worried About Running Out of Food in the Last Year: Never true    Ran Out of Food in the Last Year: Never true  Transportation Needs: No Transportation Needs (04/08/2023)   PRAPARE - Transportation    Lack of Transportation (  Medical): No    Lack of Transportation (Non-Medical): No  Physical Activity: Not on file  Stress: Not on file  Social Connections: Unknown (10/28/2021)   [facility] from [facility], [facility]   Social Network    Social Network: Not on file  Intimate Partner Violence: Not At Risk (04/08/2023)   Humiliation, Afraid, Rape, and Kick questionnaire    Fear of Current or Ex-Partner: No    Emotionally Abused: No    Physically Abused: No     Sexually Abused: No   Family History  Problem Relation Age of Onset   Lung cancer Mother    Brain cancer Mother    Lymphoma Father    Heart disease Brother    Heart attack Brother    Diabetes Mellitus II Brother    Diabetes Mellitus II Paternal Grandfather    There were no vitals taken for this visit.  Wt Readings from Last 3 Encounters:  04/12/23 (!) 138.6 kg (305 lb 9.6 oz)  03/30/23 (!) 139.6 kg (307 lb 12.8 oz)  03/09/23 (!) 140.6 kg (310 lb)   PHYSICAL EXAM: General:  NAD. No resp difficulty, walked into clinic HEENT: Normal Neck: Supple. No JVD, thick neck. Carotids 2+ bilat; no bruits. No lymphadenopathy or thryomegaly appreciated. Cor: PMI nondisplaced. Regular rate & rhythm. No rubs, gallops or murmurs. Lungs: Clear Abdomen: Soft, obese, nontender, nondistended. No hepatosplenomegaly. No bruits or masses. Good bowel sounds. Extremities: No cyanosis, clubbing, rash, trace BLE edema Neuro: Alert & oriented x 3, cranial nerves grossly intact. Moves all 4 extremities w/o difficulty. Affect pleasant.  ECG (personally reviewed): A sensed, V paced 64 bpm  REDs: 31%  Device interrogation (personally reviewed): CorVue suggests mild volume overload but thoracic impedence is at reference line, 94% BiV paced, no further VT, no AF.  ASSESSMENT & PLAN: 1. VT Arrest  - recurrent episodes, 2nd admit in the last 2 wks - Admitted 10/10-10/13/24 w/ VF treated w/ ICD shock>>loaded w/ IV amio  - Re-admitted now w/ VT arrest, treated w/ appropriate ICD shock x 2 + brief bystander CPR  - in setting of underling CM w/ EF < 35% + hypokalemia (K 2.5) - Continue amiodarone 400 mg daily until EP follow up - Continue mexiletine 250 mg bid - No VT on device interrogation. - Check BMET and Mag today    2  Chronic Systolic HF - due to iCM - Echo (8/20): EF 20-25%  - Echo (9/22): 35-40% - Echo (6/23): EF 30-35% - Echo (8/24): EF 20-25%, RV ok. - R/LHC (9/24): stable 3v CAD, elevated  biventricular filling pressures with low PAPi; CI 2.2 - He appears to have end-stage HF. With poor RV function, not a VAD candidate. BMI may prohibit transplant consideration. He has been referred to [facility] for transplant work up. Was seen by Dr. Edwena Blow on 04/02/23. Plan is to further discuss candidacy w/ transplant team. - NYHA IIIB-IV. Volume ok today on device interrogation and ReDs 31%. Soft BP limits GDMT today. - Continue torsemide 60 mg bid. - Avoid metolazone - can use Furoscix at home PRN (has home supply).  - Off Entresto with soft BP - Continue spiro 25 mg daily  - Continue Jardiance 10 mg daily. - Labs at PCP on Monday showed K 4.4, requested labs today. - Dr. Gala Romney discussed with [facility]. Plan to work up for VAD here, with eye toward transplant down the road if he is able to get weight down. - Arrange RHC soon with Dr. Gala Romney to  get updated hemodynamics. - Get ltd echo.   3. CAD with chronic stable angina - s/p multiple stents to LAD and LCX - Cath 9/22 stable CAD - LHC (9/24) with stable 3v CAD - No chest pain - Continue ASA. - Continue Ranexa 500 mg bid.  - On high-intensity statin. - Off Imdur with soft BP   4. H/o AKI  - SCr 1.2 - avoid hypotension. - Continue SGLT2i - Labs today   5. DM2 - A1c 7.2 (1/24) - Continue SGLT2i   6. OSA - Not using CPAP, says machine was recalled a while ago - will need updated sleep study for new CPAP  - Refer back to Dr. Mayford Knife   7. Obesity - Body mass index is 37.27 kg/m. - Has struggled with weight loss - Continue Ozempic, may have better success with tirzepatide.   8. Frequent PVCs - On mexilitene 250 mg bid - Zio 5/22 PVC burden 5.4%.  - Followed by Dr. Elberta Fortis - Continue current management - Labs today.   Follow up in 3 weeks with APP and 6 weeks with PharmD for GDMT titration and close monitoring of volume. VAD team to see today.  Prince Rome, FNP-BC 04/18/23   Patient seen and examined with the  above-signed Advanced Practice Provider and/or Housestaff. I personally reviewed laboratory data, imaging studies and relevant notes. I independently examined the patient and formulated the important aspects of the plan. I have edited the note to reflect any of my changes or salient points. I have personally discussed the plan with the patient and/or family.  Recently seen at [facility] for transplant eval and felt not to be candidate due to size. Referred back here for VAD  Volume status much improved. Still SOB with just mild exertion. BP soft.   General: Obese male No resp difficulty HEENT: normal Neck: supple. no JVD. Carotids 2+ bilat; no bruits. No lymphadenopathy or thryomegaly appreciated. Cor: . Regular rate & rhythm. No rubs, gallops or murmurs. Lungs: clear Abdomen: Obese soft, nontender, nondistended.Peri [place] bowel sounds. Extremities: no cyanosis, clubbing, rash, edema Neuro: alert & orientedx3, cranial nerves grossly intact. moves all 4 extremities w/o difficulty. Affect pleasant  He has end-stage HF. No transplant candidate due to size. (I discussed personally with [facility] team). Currently optimized from med and volume standpoint   Long discussion about VAD process with him and his wife. They are ready to proceed. Wil have VAD team see and begin w/u.   I spent a total of 45 minutes today: 1) reviewing the patient's medical records including previous charts, labs and recent notes from other providers; 2) examining the patient and counseling them on their medical issues/explaining the plan of care; 3) adjusting meds as needed and 4) ordering lab work or other needed tests.   Arvilla Meres, MD  3:30 PM

## 2023-04-18 ENCOUNTER — Other Ambulatory Visit (HOSPITAL_COMMUNITY): Payer: Self-pay | Admitting: *Deleted

## 2023-04-18 ENCOUNTER — Encounter (HOSPITAL_COMMUNITY): Payer: Self-pay | Admitting: *Deleted

## 2023-04-18 ENCOUNTER — Encounter (HOSPITAL_COMMUNITY): Payer: Self-pay

## 2023-04-18 ENCOUNTER — Ambulatory Visit (HOSPITAL_COMMUNITY)
Admission: RE | Admit: 2023-04-18 | Discharge: 2023-04-18 | Disposition: A | Discharge status: Home / Self Care | Payer: 59 | Source: Ambulatory Visit | Attending: Family Medicine | Admitting: Family Medicine

## 2023-04-18 VITALS — BP 110/82 | HR 70 | Wt 306.2 lb

## 2023-04-18 DIAGNOSIS — I25118 Atherosclerotic heart disease of native coronary artery with other forms of angina pectoris: Secondary | ICD-10-CM | POA: Insufficient documentation

## 2023-04-18 DIAGNOSIS — I110 Hypertensive heart disease with heart failure: Secondary | ICD-10-CM | POA: Diagnosis not present

## 2023-04-18 DIAGNOSIS — Z79899 Other long term (current) drug therapy: Secondary | ICD-10-CM | POA: Diagnosis not present

## 2023-04-18 DIAGNOSIS — Z4502 Encounter for adjustment and management of automatic implantable cardiac defibrillator: Secondary | ICD-10-CM | POA: Insufficient documentation

## 2023-04-18 DIAGNOSIS — Z6837 Body mass index (BMI) 37.0-37.9, adult: Secondary | ICD-10-CM | POA: Insufficient documentation

## 2023-04-18 DIAGNOSIS — G4733 Obstructive sleep apnea (adult) (pediatric): Secondary | ICD-10-CM

## 2023-04-18 DIAGNOSIS — I472 Ventricular tachycardia, unspecified: Secondary | ICD-10-CM | POA: Insufficient documentation

## 2023-04-18 DIAGNOSIS — E876 Hypokalemia: Secondary | ICD-10-CM | POA: Diagnosis not present

## 2023-04-18 DIAGNOSIS — I493 Ventricular premature depolarization: Secondary | ICD-10-CM

## 2023-04-18 DIAGNOSIS — R9431 Abnormal electrocardiogram [ECG] [EKG]: Secondary | ICD-10-CM | POA: Diagnosis not present

## 2023-04-18 DIAGNOSIS — Z7985 Long-term (current) use of injectable non-insulin antidiabetic drugs: Secondary | ICD-10-CM | POA: Insufficient documentation

## 2023-04-18 DIAGNOSIS — I5084 End stage heart failure: Secondary | ICD-10-CM | POA: Diagnosis not present

## 2023-04-18 DIAGNOSIS — E785 Hyperlipidemia, unspecified: Secondary | ICD-10-CM | POA: Insufficient documentation

## 2023-04-18 DIAGNOSIS — Z7984 Long term (current) use of oral hypoglycemic drugs: Secondary | ICD-10-CM | POA: Diagnosis not present

## 2023-04-18 DIAGNOSIS — I5022 Chronic systolic (congestive) heart failure: Secondary | ICD-10-CM

## 2023-04-18 DIAGNOSIS — E119 Type 2 diabetes mellitus without complications: Secondary | ICD-10-CM | POA: Insufficient documentation

## 2023-04-18 DIAGNOSIS — Z713 Dietary counseling and surveillance: Secondary | ICD-10-CM | POA: Diagnosis not present

## 2023-04-18 DIAGNOSIS — E669 Obesity, unspecified: Secondary | ICD-10-CM | POA: Diagnosis not present

## 2023-04-18 DIAGNOSIS — Z8674 Personal history of sudden cardiac arrest: Secondary | ICD-10-CM | POA: Diagnosis not present

## 2023-04-18 DIAGNOSIS — Z01818 Encounter for other preprocedural examination: Secondary | ICD-10-CM

## 2023-04-18 LAB — CUP PACEART REMOTE DEVICE CHECK
Battery Remaining Longevity: 32 mo
Battery Remaining Percentage: 40 %
Battery Voltage: 2.93 V
Brady Statistic AP VP Percent: 1 %
Brady Statistic AP VS Percent: 1 %
Brady Statistic AS VP Percent: 93 %
Brady Statistic AS VS Percent: 4.3 %
Brady Statistic RA Percent Paced: 1 %
Date Time Interrogation Session: 20241028023403
HighPow Impedance: 84 Ohm
HighPow Impedance: 84 Ohm
Implantable Lead Connection Status: 753985
Implantable Lead Connection Status: 753985
Implantable Lead Connection Status: 753985
Implantable Lead Implant Date: 20200930
Implantable Lead Implant Date: 20200930
Implantable Lead Implant Date: 20200930
Implantable Lead Location: 753858
Implantable Lead Location: 753859
Implantable Lead Location: 753860
Implantable Pulse Generator Implant Date: 20200930
Lead Channel Impedance Value: 1050 Ohm
Lead Channel Impedance Value: 390 Ohm
Lead Channel Impedance Value: 480 Ohm
Lead Channel Pacing Threshold Amplitude: 0.75 V
Lead Channel Pacing Threshold Amplitude: 0.75 V
Lead Channel Pacing Threshold Amplitude: 1 V
Lead Channel Pacing Threshold Pulse Width: 0.5 ms
Lead Channel Pacing Threshold Pulse Width: 0.5 ms
Lead Channel Pacing Threshold Pulse Width: 0.5 ms
Lead Channel Sensing Intrinsic Amplitude: 11.7 mV
Lead Channel Sensing Intrinsic Amplitude: 2.2 mV
Lead Channel Setting Pacing Amplitude: 2 V
Lead Channel Setting Pacing Amplitude: 2.5 V
Lead Channel Setting Pacing Amplitude: 2.5 V
Lead Channel Setting Pacing Pulse Width: 0.5 ms
Lead Channel Setting Pacing Pulse Width: 0.5 ms
Lead Channel Setting Sensing Sensitivity: 0.5 mV
Pulse Gen Serial Number: 9901124
Zone Setting Status: 755011

## 2023-04-18 LAB — BASIC METABOLIC PANEL
Anion gap: 11 (ref 5–15)
BUN: 23 mg/dL — ABNORMAL HIGH (ref 6–20)
CO2: 28 mmol/L (ref 22–32)
Calcium: 9 mg/dL (ref 8.9–10.3)
Chloride: 99 mmol/L (ref 98–111)
Creatinine, Ser: 1.85 mg/dL — ABNORMAL HIGH (ref 0.61–1.24)
GFR, Estimated: 43 mL/min — ABNORMAL LOW (ref >60)
Glucose, Bld: 122 mg/dL — ABNORMAL HIGH (ref 70–99)
Potassium: 4.3 mmol/L (ref 3.5–5.1)
Sodium: 138 mmol/L (ref 135–145)

## 2023-04-18 LAB — CBC
HCT: 45.2 % (ref 39.0–52.0)
Hemoglobin: 15.3 g/dL (ref 13.0–17.0)
MCH: 31.9 pg (ref 26.0–34.0)
MCHC: 33.8 g/dL (ref 30.0–36.0)
MCV: 94.2 fL (ref 80.0–100.0)
Platelets: 237 10*3/uL (ref 150–400)
RBC: 4.8 MIL/uL (ref 4.22–5.81)
RDW: 14.6 % (ref 11.5–15.5)
WBC: 7.5 10*3/uL (ref 4.0–10.5)
nRBC: 0 % (ref 0.0–0.2)

## 2023-04-18 NOTE — Progress Notes (Signed)
VAD coordinator spoke with pt regarding upcoming VAD evaluation testing appointments on Friday 11/1. Letter sent in MyChart with appt information. All questions answered at this time.   Alyce Pagan RN VAD Coordinator  Office: [phone]  24/7 Pager: [phone]

## 2023-04-18 NOTE — Patient Instructions (Signed)
Thank you for coming in today  If you had labs drawn today, any labs that are abnormal the clinic will call you No news is good news  You have been referred to  Dr. Mayford Knife regarding CPAP, their office will call you for further appointment details.   [facility] [address] [place] [address] Dept: [phone] Loc: [phone]  Joe [name]  04/18/2023  You are scheduled for a Cardiac Catheterization on Monday, November 11 with Dr. Arvilla Meres.  1. Please arrive at the [facility] (Main Entrance A) at [facility]: [address] [place], [place] [address] at 9:00 AM (This time is 2 hour(s) before your procedure to ensure your preparation). Free valet parking service is available. You will check in at ADMITTING. The support person will be asked to wait in the waiting room.  It is OK to have someone drop you off and come back when you are ready to be discharged.    Special note: Every effort is made to have your procedure done on time. Please understand that emergencies sometimes delay scheduled procedures.  2. Diet: Do not eat solid foods after midnight.  The patient may have clear liquids until 5am upon the day of the procedure.  3. Labs: You will need to have blood drawn on  4. Medication instructions in preparation for your procedure:   Contrast Allergy: No  Hold Ozempic for 7 days prior to heart cath procedure.   Current Outpatient Medications (Endocrine & Metabolic):    empagliflozin (JARDIANCE) 10 MG TABS tablet, Take 1 tablet (10 mg total) by mouth daily before breakfast.   levothyroxine (SYNTHROID) 25 MCG tablet, Take 25 mcg by mouth daily.   OZEMPIC, 1 MG/DOSE, 4 MG/3ML SOPN, Inject 1 mg into the skin every Monday.  Current Outpatient Medications (Cardiovascular):    amiodarone (PACERONE) 200 MG tablet, Take 2 tablets (400 mg total) by mouth 2 (two) times daily for  7 days, THEN 1 tablet (200 mg total) 2 (two) times daily.   mexiletine (MEXITIL) 250 MG capsule, Take 1 capsule (250 mg total) by mouth 2 (two) times daily.   nitroGLYCERIN (NITROSTAT) 0.4 MG SL tablet, Place 1 tablet (0.4 mg total) under the tongue every 5 (five) minutes as needed for chest pain.   ranolazine (RANEXA) 500 MG 12 hr tablet, Take 1 tablet (500 mg total) by mouth 2 (two) times daily.   rosuvastatin (CRESTOR) 40 MG tablet, Take 1 tablet (40 mg total) by mouth daily. (Patient taking differently: Take 40 mg by mouth at bedtime.)   spironolactone (ALDACTONE) 25 MG tablet, Take 1 tablet (25 mg total) by mouth daily.   torsemide (DEMADEX) 20 MG tablet, Take 3 tablets (60 mg total) by mouth 2 (two) times daily.  Current Outpatient Medications (Respiratory):    Aromatic Inhalants (VICKS VAPOINHALER) INHA, Inhale 1 puff into the lungs daily as needed (congestion).  Current Outpatient Medications (Analgesics):    acetaminophen (TYLENOL) 500 MG tablet, Take 1,000 mg by mouth every 6 (six) hours as needed for moderate pain or headache.   allopurinol (ZYLOPRIM) 100 MG tablet, Take 100 mg by mouth daily.   aspirin EC 81 MG tablet, Take 1 tablet (81 mg total) by mouth daily.   colchicine 0.6 MG tablet, Take 0.6 mg by mouth daily as needed (Gout).   Current Outpatient Medications (Other):    diclofenac Sodium (VOLTAREN) 1 % GEL, APPLY 1 G TOPICALLY DAILY. (  Patient taking differently: Apply 2 g topically daily as needed (for pain).)   escitalopram (LEXAPRO) 10 MG tablet, Take 10 mg by mouth at bedtime.   gabapentin (NEURONTIN) 300 MG capsule, Take 300 mg by mouth 3 (three) times daily.   magnesium oxide (MAG-OX) 400 (240 Mg) MG tablet, Take 1 tablet (400 mg total) by mouth daily.   omeprazole (PRILOSEC) 40 MG capsule, Take 1 capsule (40 mg total) by mouth daily.   Polyethylene Glycol 400 (BLINK TEARS OP), Place 2 drops into both eyes 4 (four) times daily as needed (for dry eyes).   potassium  chloride SA (KLOR-CON M) 20 MEQ tablet, Take 2 tablets (40 mEq total) by mouth 3 (three) times daily.   triamcinolone cream (KENALOG) 0.1 %, Apply 1 Application topically 2 (two) times daily. *For reference purposes while preparing patient instructions.   Delete this med list prior to printing instructions for patient.*       Hold Ozempic for 7 days prior to heart cath procedure  On the morning of your procedure, take your Aspirin 81 mg and any morning medicines NOT listed above.  You may use sips of water.  5. Plan to go home the same day, you will only stay overnight if medically necessary. 6. Bring a current list of your medications and current insurance cards. 7. You MUST have a responsible person to drive you home. 8. Someone MUST be with you the first 24 hours after you arrive home or your discharge will be delayed. 9. Please wear clothes that are easy to get on and off and wear slip-on shoes.  Thank you for allowing [place] to care for you!   -- [facility]   Medications: No changes   Follow up appointments:  Your physician recommends that you schedule a follow-up appointment in:  2 weeks pharmacy 6 weeks in clinic  12 weeks With Dr. Gala Romney You will receive a reminder letter in the mail a few months in advance. If you don't receive a letter, please call our office to schedule the follow-up appointment.  Your physician has requested that you have an echocardiogram. Echocardiography is a painless test that uses sound waves to create images of your heart. It provides your doctor with information about the size and shape of your heart and how well your heart's chambers and valves are working. This procedure takes approximately one hour. There are no restrictions for this procedure.       Do the following things EVERYDAY: Weigh yourself in the morning before breakfast. Write it down and keep it in a log. Take your medicines as prescribed Eat  low salt foods--Limit salt (sodium) to 2000 mg per day.  Stay as active as you can everyday Limit all fluids for the day to less than 2 liters   At the [facility], you and your health needs are our priority. As part of our continuing mission to provide you with exceptional heart care, we have created designated Provider Care Teams. These Care Teams include your primary Cardiologist (physician) and Advanced Practice Providers (APPs- Physician Assistants and Nurse Practitioners) who all work together to provide you with the care you need, when you need it.   You may see any of the following providers on your designated Care Team at your next follow up: Dr Arvilla Meres Dr Marca Ancona Dr. Marcos Eke, NP Robbie Lis, PA [facility] [place], [place] Brynda Peon, NP Karle Plumber, PharmD  Please be sure to bring in all your medications bottles to every appointment.    Thank you for choosing [facility]  If you have any questions or concerns before your next appointment please send [place] a message through [place] or call our office at [phone].    TO LEAVE A MESSAGE FOR THE NURSE SELECT OPTION 2, PLEASE LEAVE A MESSAGE INCLUDING: YOUR NAME DATE OF BIRTH CALL BACK NUMBER REASON FOR CALL**this is important as we prioritize the call backs  YOU WILL RECEIVE A CALL BACK THE SAME DAY AS LONG AS YOU CALL BEFORE 4:00 PM

## 2023-04-18 NOTE — Progress Notes (Signed)
ReDS Vest / Clip - 04/18/23 1000       ReDS Vest / Clip   Station Marker D    Ruler Value 45    ReDS Value Range Low volume    ReDS Actual Value 31

## 2023-04-20 ENCOUNTER — Ambulatory Visit (HOSPITAL_COMMUNITY)
Admission: RE | Admit: 2023-04-20 | Discharge: 2023-04-20 | Disposition: A | Discharge status: Home / Self Care | Payer: 59 | Source: Ambulatory Visit | Attending: Internal Medicine

## 2023-04-20 ENCOUNTER — Ambulatory Visit (HOSPITAL_COMMUNITY)
Admission: RE | Admit: 2023-04-20 | Discharge: 2023-04-20 | Disposition: A | Discharge status: Home / Self Care | Payer: 59 | Source: Ambulatory Visit | Attending: Internal Medicine | Admitting: Internal Medicine

## 2023-04-20 ENCOUNTER — Institutional Professional Consult (permissible substitution) (INDEPENDENT_AMBULATORY_CARE_PROVIDER_SITE_OTHER): Payer: 59 | Admitting: Cardiothoracic Surgery

## 2023-04-20 ENCOUNTER — Encounter: Payer: Self-pay | Admitting: Cardiothoracic Surgery

## 2023-04-20 ENCOUNTER — Ambulatory Visit (HOSPITAL_BASED_OUTPATIENT_CLINIC_OR_DEPARTMENT_OTHER)
Admission: RE | Admit: 2023-04-20 | Discharge: 2023-04-20 | Disposition: A | Discharge status: Home / Self Care | Payer: 59 | Source: Ambulatory Visit | Attending: Internal Medicine | Admitting: Internal Medicine

## 2023-04-20 VITALS — BP 102/66 | HR 60 | Resp 20 | Ht 76.0 in | Wt 306.0 lb

## 2023-04-20 DIAGNOSIS — I5042 Chronic combined systolic (congestive) and diastolic (congestive) heart failure: Secondary | ICD-10-CM

## 2023-04-20 DIAGNOSIS — Z01812 Encounter for preprocedural laboratory examination: Secondary | ICD-10-CM | POA: Diagnosis present

## 2023-04-20 DIAGNOSIS — I5022 Chronic systolic (congestive) heart failure: Secondary | ICD-10-CM | POA: Diagnosis present

## 2023-04-20 DIAGNOSIS — Z01818 Encounter for other preprocedural examination: Secondary | ICD-10-CM

## 2023-04-20 DIAGNOSIS — E669 Obesity, unspecified: Secondary | ICD-10-CM | POA: Diagnosis not present

## 2023-04-20 DIAGNOSIS — Z01810 Encounter for preprocedural cardiovascular examination: Secondary | ICD-10-CM | POA: Diagnosis present

## 2023-04-20 LAB — LACTATE DEHYDROGENASE: LDH: 176 U/L (ref 98–192)

## 2023-04-20 LAB — ANTITHROMBIN III: AntiThromb III Func: 118 % (ref 75–120)

## 2023-04-20 LAB — T4, FREE: Free T4: 0.91 ng/dL (ref 0.61–1.12)

## 2023-04-20 NOTE — Progress Notes (Signed)
PCP is Lonie Peak, PA-C Referring Provider is Bensimhon, Bevelyn Buckles, MD  Chief Complaint  Patient presents with   Congestive Heart Failure    New patient consultation, review all studies  Patient examined, images of echocardiogram, CT scan of chest, cardiac catheterization personally reviewed and counseled patient and wife.  HPI: 52 year old obese diabetic non-smoker with ischemic cardiomyopathy and fairly longstanding CHF with reduced LVEF is being evaluated for implantable VAD therapy.  He has been hospitalized 3 times in the last 6 weeks for heart failure-volume overload as well as ventricular arrhythmia and shock.  His ejection fraction is 20 to 25% by echo. His PCI vessels are patent.  Echo shows moderate RV dysfunction however right heart cath shows papi <1.0. He has no significant valvular disease and LV diastolic diameter 7.0 cm.  The patient has been evaluated at [facility] for possible transplant therapy but was felt not to be a candidate currently because of obesity, recommended to lose 25 more pounds.  Because of his large BMI and blood type 0 he could be facing a long time on the transplant waiting list.  He was recommended for VAD implantation as a probable bridge  to transplant.  The patient has had no major surgery other than the pacemaker AICD implantation and right knee arthroscopy.  His diabetes is well-controlled.  Endorgan function is satisfactory and PFTs are near normal for mechanics of breathing.  CT scan chest performed last year is unremarkable.  Patient is not currently on Plavix.  He has never been on Coumadin or Eliquis.  He denies history of bleeding tendencies or previous blood transfusion.  Patient is retired from a career in [employer] loss [employer] and he has been married for several years.  Past Medical History:  Diagnosis Date   Abnormal EKG    Acute systolic heart failure (HCC) 09/06/2017   Angina pectoris (HCC) 09/05/2017   Body mass index 45.0-49.9, adult  (HCC)    CAD S/P percutaneous coronary angioplasty 09/07/2017   mLAD and dLAD PCI with DES 09/06/17   CHF (congestive heart failure) (HCC)    Chronic systolic (congestive) heart failure (HCC) 03/19/2019   Complication of anesthesia 1995   "had hard time waking up; breathing w/vasectomy"   Coronary artery disease    Erectile dysfunction    Essential hypertension, benign    Fatigue    GERD (gastroesophageal reflux disease)    Gout    High cholesterol    "just started tx yesterday" (09/06/2017)   History of gout    "RX prn" (09/06/2017)   Ischemic cardiomyopathy 09/07/2017   EF 25-35%   Muscular chest pain    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   Below Left Nare (MOH's)   Obesity    Obstructive sleep apnea    OSA on CPAP    Plantar fasciitis    Pre-operative clearance 02/11/2018   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/02/2019   [employer]   Right bundle branch block    Shortness of breath 09/05/2017   Testosterone deficiency    Type 2 diabetes mellitus with complication, without long-term current use of insulin (HCC) 09/05/2017   Type II diabetes mellitus (HCC)    "started tx 08/28/2017"    Past Surgical History:  Procedure Laterality Date   BACK SURGERY     BIV ICD INSERTION CRT-D N/A 03/19/2019   Procedure: BIV ICD INSERTION CRT-D;  Surgeon: Regan Lemming, MD;  Location: [facility] INVASIVE CV LAB;  Service: Cardiovascular;  Laterality: N/A;  CORONARY ANGIOPLASTY WITH STENT PLACEMENT  09/06/2017   "3 stents"   CORONARY STENT INTERVENTION N/A 09/06/2017   Procedure: CORONARY STENT INTERVENTION;  Surgeon: Corky Crafts, MD;  Location: [facility] INVASIVE CV LAB;  Service: Cardiovascular;  Laterality: N/A;   CORONARY STENT INTERVENTION N/A 02/15/2018   Procedure: CORONARY STENT INTERVENTION;  Surgeon: Corky Crafts, MD;  Location: [facility] INVASIVE CV LAB;  Service: Cardiovascular;  Laterality: N/A;   KNEE ARTHROSCOPY Right    LEFT HEART CATH  AND CORONARY ANGIOGRAPHY N/A 09/06/2017   Procedure: LEFT HEART CATH AND CORONARY ANGIOGRAPHY;  Surgeon: Corky Crafts, MD;  Location: [facility] INVASIVE CV LAB;  Service: Cardiovascular;  Laterality: N/A;   LEFT HEART CATH AND CORONARY ANGIOGRAPHY N/A 02/15/2018   Procedure: LEFT HEART CATH AND CORONARY ANGIOGRAPHY;  Surgeon: Corky Crafts, MD;  Location: [facility] [facility] INVASIVE CV LAB;  Service: Cardiovascular;  Laterality: N/A;   LEFT HEART CATH AND CORONARY ANGIOGRAPHY N/A 06/27/2018   Procedure: LEFT HEART CATH AND CORONARY ANGIOGRAPHY;  Surgeon: Corky Crafts, MD;  Location: [facility] INVASIVE CV LAB;  Service: Cardiovascular;  Laterality: N/A;   LEFT HEART CATH AND CORONARY ANGIOGRAPHY N/A 06/28/2020   Procedure: LEFT HEART CATH AND CORONARY ANGIOGRAPHY;  Surgeon: [place], Kierstyn Baranowski M, MD;  Location: [facility] INVASIVE CV LAB;  Service: Cardiovascular;  Laterality: N/A;   LUMBAR SPINE SURGERY  ~ 2001   Intradiscal Electrothermoplasty   RIGHT/LEFT HEART CATH AND CORONARY ANGIOGRAPHY N/A 03/07/2021   Procedure: RIGHT/LEFT HEART CATH AND CORONARY ANGIOGRAPHY;  Surgeon: Laurey Morale, MD;  Location: [facility] INVASIVE CV LAB;  Service: Cardiovascular;  Laterality: N/A;   RIGHT/LEFT HEART CATH AND CORONARY ANGIOGRAPHY N/A 02/23/2023   Procedure: RIGHT/LEFT HEART CATH AND CORONARY ANGIOGRAPHY;  Surgeon: Dolores Patty, MD;  Location: [facility] INVASIVE CV LAB;  Service: Cardiovascular;  Laterality: N/A;   VASECTOMY  1995    Family History  Problem Relation Age of Onset   Lung cancer Mother    Brain cancer Mother    Lymphoma Father    Heart disease Brother    Heart attack Brother    Diabetes Mellitus II Brother    Diabetes Mellitus II Paternal Grandfather     Social History Social History   Tobacco Use   Smoking status: Never   Smokeless tobacco: Never  Vaping Use   Vaping status: Never Used  Substance Use Topics   Alcohol use: No   Drug use: Never    Current Outpatient Medications  Medication Sig  Dispense Refill   acetaminophen (TYLENOL) 500 MG tablet Take 1,000 mg by mouth every 6 (six) hours as needed for moderate pain or headache.     allopurinol (ZYLOPRIM) 100 MG tablet Take 100 mg by mouth daily.     amiodarone (PACERONE) 200 MG tablet Take 2 tablets (400 mg total) by mouth 2 (two) times daily for 7 days, THEN 1 tablet (200 mg total) 2 (two) times daily. 90 tablet 0   Aromatic Inhalants (VICKS VAPOINHALER) INHA Inhale 1 puff into the lungs daily as needed (congestion).     aspirin EC 81 MG tablet Take 1 tablet (81 mg total) by mouth daily. 90 tablet 3   colchicine 0.6 MG tablet Take 0.6 mg by mouth daily as needed (Gout).     diclofenac Sodium (VOLTAREN) 1 % GEL APPLY 1 G TOPICALLY DAILY. (Patient taking differently: Apply 2 g topically daily as needed (for pain).) 100 g 0   empagliflozin (JARDIANCE) 10 MG TABS tablet Take 1 tablet (10  mg total) by mouth daily before breakfast. 30 tablet 11   escitalopram (LEXAPRO) 10 MG tablet Take 10 mg by mouth at bedtime.     gabapentin (NEURONTIN) 300 MG capsule Take 300 mg by mouth 3 (three) times daily.     levothyroxine (SYNTHROID) 25 MCG tablet Take 25 mcg by mouth daily.     magnesium oxide (MAG-OX) 400 (240 Mg) MG tablet Take 1 tablet (400 mg total) by mouth daily. 30 tablet 5   mexiletine (MEXITIL) 250 MG capsule Take 1 capsule (250 mg total) by mouth 2 (two) times daily. 60 capsule 11   nitroGLYCERIN (NITROSTAT) 0.4 MG SL tablet Place 1 tablet (0.4 mg total) under the tongue every 5 (five) minutes as needed for chest pain. 25 tablet 3   omeprazole (PRILOSEC) 40 MG capsule Take 1 capsule (40 mg total) by mouth daily. 90 capsule 1   OZEMPIC, 1 MG/DOSE, 4 MG/3ML SOPN Inject 1 mg into the skin every Monday.     Polyethylene Glycol 400 (BLINK TEARS OP) Place 2 drops into both eyes 4 (four) times daily as needed (for dry eyes).     potassium chloride SA (KLOR-CON M) 20 MEQ tablet Take 2 tablets (40 mEq total) by mouth 3 (three) times daily. 540  tablet 1   ranolazine (RANEXA) 500 MG 12 hr tablet Take 1 tablet (500 mg total) by mouth 2 (two) times daily. 180 tablet 2   rosuvastatin (CRESTOR) 40 MG tablet Take 1 tablet (40 mg total) by mouth daily. (Patient taking differently: Take 40 mg by mouth at bedtime.) 90 tablet 2   spironolactone (ALDACTONE) 25 MG tablet Take 1 tablet (25 mg total) by mouth daily. 30 tablet 5   torsemide (DEMADEX) 20 MG tablet Take 3 tablets (60 mg total) by mouth 2 (two) times daily. 540 tablet 1   triamcinolone cream (KENALOG) 0.1 % Apply 1 Application topically 2 (two) times daily.     No current facility-administered medications for this visit.    No Known Allergies  Review of Systems:                   Review of Systems :  [ y ] = yes, [  ] = no        General :  Weight gain [   ]    Weight loss  [   ]  Fatigue [ y ]  Fever [  ]  Chills  [  ]                                          HEENT    Headache [  ]  Dizziness [  ]  Blurred vision [  ] Glaucoma  [  ]                          Nosebleeds [  ] Painful or loose teeth [  ]                         He completes dental hygiene at least once a year       Cardiac :  Chest pain/ pressure [  ]  Resting SOB [  ] exertional SOB [  y]  Orthopnea [  ]  Pedal edema  [  y]  Palpitations [  ] Syncope/presyncope [ ]                         Paroxysmal nocturnal dyspnea [  ]         Pulmonary : cough [  ]  wheezing [  ]  Hemoptysis [  ] Sputum [  ] Snoring [  ]                              Pneumothorax [  ]  Sleep apnea [ y ]        GI : Vomiting [  ]  Dysphagia [  ]  Melena  [  ]  Abdominal pain [  ] BRBPR [  ]              Heart burn [  ]  Constipation [  ] Diarrhea  [  ] Colonoscopy [   ]        GU : Hematuria [  ]  Dysuria [  ]  Nocturia [  ] UTI's [  ]        Vascular : Claudication [  ]  Rest pain [  ]  DVT [  ] Vein stripping [  ] leg ulcers [  ]                          TIA [  ] Stroke [  ]  Varicose veins [ y ]        NEURO :   Headaches  [  ] Seizures [  ] Vision changes [  ] Paresthesias [  ]                                               Musculoskeletal :  Arthritis [ y ] Gout  [  ]  Back pain [  ]  Joint pain [  ]        Skin :  Rash [  ]  Melanoma [  ] Sores [  ]        Heme : Bleeding problems [  ]Clotting Disorders [  ] Anemia [  ]Blood Transfusion [ ]         Endocrine : Diabetes [  y] Heat or Cold intolerance [  ] Polyuria [  ]excessive thirst [ ]         Psych : Depression [  ]  Anxiety [  ]  Psych hospitalizations [  ] Memory change [  ]                                                                 BP 102/66 (BP Location: Right Arm, Patient Position: Sitting, Cuff Size: Large)   Pulse 60   Resp 20   Ht 6\' 4"  (1.93 m)   Wt (!) 306 lb (138.8 kg)   SpO2 96% Comment: RA  BMI 37.25 kg/m  Physical  Exam:     Physical Exam  General: Middle-aged overweight male no acute distress accompanied by his wife HEENT: Normocephalic pupils equal , dentition adequate Neck: Supple without JVD, adenopathy, or bruit Chest: Clear to auscultation, symmetrical breath sounds, no rhonchi, no tenderness             or deformity Cardiovascular: Regular rate and rhythm, soft 2/6 holosystolic murmur radiating to left axill no gallop, peripheral pulses             palpable in all extremities Abdomen:  Soft, nontender, no palpable mass or organomegaly Extremities: Warm, well-perfused, no clubbing cyanosis edema or tenderness,              no venous stasis changes of the legs Rectal/GU: Deferred Neuro: Grossly non--focal and symmetrical throughout Skin: Clean and dry without rash or ulceration   Diagnostic Tests: Echocardiogram shows EF 20 to 25% without significant valvular disease and LV dilatation with moderate RV dysfunction  Right heart cath 02-23-2023 RA 18, PA 41/35, wedge 27, cardiac index 2.0 PA saturation 64% papi 0.8 Impression: The patient appears to be appropriate candidate for implantable VAD therapy  with bridge to transplantation as indication.  Because of preoperative RV dysfunction he would probably benefit from hospitalization and a course of IV milrinone prior to implantation. I have discussed the procedure of VAD implantation in detail with the patient and his wife including the benefits and risks and the expected postoperative recovery.  They demonstrated their understanding and agreed to proceed with full evaluation for implantable VAD therapy. Plan: Patient will complete his pre-VAD evaluation including repeat right heart cath scheduled  in  about 10 days.  His Ozempic is on hold until date of catheterization.   Lovett Sox, MD [facility] [phone]

## 2023-04-20 NOTE — Addendum Note (Signed)
Encounter addended by: Burna Sis, LCSW on: 04/20/2023 3:22 PM  Actions taken: Flowsheet accepted, Patient Contact edited, Clinical Note Signed

## 2023-04-20 NOTE — Progress Notes (Signed)
Lower extremity venous duplex completed. Please see CV Procedures for preliminary results.  Shona Simpson, RVT 04/20/23 2:23 PM

## 2023-04-20 NOTE — Progress Notes (Signed)
LVAD Initial Psychosocial Screening  Date/Time Initiated:  04/20/2023, 11:30am  Referral Source:  LVAD Coordinators Referral Reason:  LVAD Initial Psychosocial Screening Source of Information:  Patient and patient spouse Joe [name]  Demographics Name:  Joe [name] Address:  [address] [place], [place] [address] Home phone:  [phone] (home)     Marital Status: Married  Faith:  raised Christian but no current Art gallery manager or association Primary Language:  English SS: Last 4#: [id number] DOB: 03-19-1971   Medical & Follow-up Adherence to Medical regimen/INR checks: compliant  Medication adherence: compliant  Physician/Clinic Appointment Attendance: compliant Comments: Reports dealing with his heart health since 2019 and being very motivated and diligent with medical recommendations from physicians.   Advance Directives: Do you have a Living Will or Medical POA? Yes  Do you have Goals of Care? Yes  Have you had a consult with the Palliative Care Team at [facility]? No  Psychological Health Appearance:  Unremarkable Mental Status:  Alert, oriented Eye Contact:  Good Thought Content:  Coherent Speech:  Unremarkable Mood:  Appropriate  Affect:  Appropriate to circumstance and Positive Insight:  Good Judgement: Unimpaired Interaction Style:  Engaged  Family/Social Information Who lives in your home? Name: Joe [name]  Relationship: Wife of 26 years   Other family members/support persons in your life? Name: Koltyn Kelsay  Brother Alesia Richards   Sister  Children of Joe [name]: Ivin Booty -lives in [place]- lives in [place], [place]  Children of Joe [name] (patient): PJ- [place] moving on property with patient Molly Maduro- lives in [place], [place]- lives in [place], [place]- lives in [place], [place]    Caregiving Needs Who is the primary caregiver? Joe [name] Woodrick- wife Health status:  good- has fibromyalgia but reports this doesn't prevent her from doing anything Do  you drive?  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  No regular or required obligations- has a sister who has ALS who they sometimes help with if their spouse needs a break but lives in [place].  Also has 3 dogs and two cats. Contact number: [phone]  Who is the secondary caregiver? PJ Information systems manager) Health status:  good Do you drive?  yes Do you work?  Works 7-5pm on weekdays  Physical Limitations:  none Do you have other care giving responsibilities?  Has two children and a wife but wife is supportive and no concerns about him being available to assist if needed. Contact number: [phone]  Home Environment/Personal Care Do you have reliable phone service? Yes  If so, what is the number?  [phone] Do you own or rent your home? Own- just moved in over the summer Number of steps into the home? Has a ramp into home in addition to steps How many levels in the home? 1 Assistive devices in the home? None at this time- interested in shower chair Electrical needs for LVAD (3 prong outlets)? yes Second hand smoke exposure in the home? no Travel distance from [facility]? 35 minutes  Community Are you active with community agencies/resources/homecare? No  Are you active in a church, synagogue, mosque or other faith based community? No  Are you active in any clubs or social organizations? no What do you do for fun?  Hobbies?  Interests? Enjoys doing yardwork.  They like taking cruises.  Will often take long day road trips- [place] and drive back the same day.  Education/Work Information What is the last grade of school you completed? Some college Preferred method of learning?  Verbal and Hands on Do you have any problems with reading or writing?  No Are you currently employed?  No  When were you  last employed? Nov. 2022  Name of employer? 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 15 years If you are not working, do you plan to return to work after VAD surgery? Yes- would like to return to work if he feels well enough If yes, what type of employment do you hope to find? Something different than what he had been doing- thinks stress from his job was a contributor to heart condition. Did you serve in the military? No    Financial Information What is your source of income? Getting long term disability benefits from his former employer getting $4915/month (minus lawyer fees of 1/3 that amount).  Also awaiting disability determination- informed this would be between $6000-7000/month plus some continued LTD benefits through work though those will be reduced. Do you have difficulty meeting your monthly expenses? No- wife's jobs bring in sufficient income Can you budget for the monthly cost for dressing supplies post procedure? Yes  Primary Health insurance:  [facility]- reports he gets this through [facility] which will end May 2025- very expensive though (wife and him its $1,600/month)- is looking at [facility] insurance options during open enrollment this year.  Also hopeful to get Medicare April 2025 if disability is approved that would be the 2 year mark. Secondary Insurance:none Do you use mail order for your prescriptions?  No Have you ever had to refuse medication due to cost?  No Have you applied for Medicaid?  no Have you applied for Social Security Disability (SSI)  yes  Medical Information Briefly describe why you are here for evaluation: Able to describe basics of surgery for LVAD and purpose of LVAD to support the left side of his heart.  Accurately explains what equipment he will need post implant. Do you have a PCP or other medical provider? Lonie Peak Are you able to complete your ADL's?  yes Do you have a history of  trauma, physical, emotional, or sexual abuse? no Do you have any family history of heart problems? Brother had heart surgery- no known issues with his parents or other relatives. Do you smoke now or past usage? never    Do you drink alcohol now or past usage? past usage - only drank sporadically on special occasions never misused  Are you currently using illegal drugs or misuse of medication or past usage? never  Have you ever been treated for substance abuse? No  Mental Health History How have you been feeling in the past year? Overall has been feeling ok.  Has struggled recently with anxiety over health condition- easy to become anxious with small changes in his health and be concerned they are more serious.   Have you ever had any problems with depression, anxiety or other mental health issues? Did have a bout of depression in 2022 when his granddaughter, Dorene Grebe (10yo), who he is very close with moved to [place] with her father.  Dorene Grebe had lived on and off with the patient over the years as the father is in the military so they stepped in to help often.  Was started on lexapro after this happened and  has remained on it.  Now Dorene Grebe has moved back home as of this summer and stays with them every other weekend- this has been a great source of joy for them. Do you see a counselor, psychiatrist or therapist?  no If you are currently experiencing problems are you interested in talking with a professional? No Have you or are you taking medications for anxiety/depression or any mental health concerns?  Yes  Current Medications: lexapro What are your coping strategies under stressful situations? Spends time with family Are there any other stressors in your life?  no Have you had any past or current thoughts of suicide? no How many hours do you sleep at night? 6-8 hours of sleep- wakes up frequently to urinate and then will take a nap during the day How is your appetite? Its been low which he attributes  to being on ozempic Would you be interested in attending the LVAD support group? yes  PHQ2 Depression Scale: 1  Legal Do you currently have any legal issues/problems?  no Have you had any legal issues/problems in the past?  no Do you have a Durable POA?  Yes Name of DOA? Joe [name]  Plan for VAD Implementation Do you know and understand what happens during the VAD surgery? Patient Verbalizes Understanding  of surgery and able to describe details What do you know about the risks and side effect associated with VAD surgery? Patient Verbalizes Understanding  of risks (infection, stroke and death) Explain what will happen right after surgery: Patient Verbalizes Understanding  of OR to ICU and will be intubated What is your plan for transportation for the first 8 weeks post-surgery? (Patients are not recommended to drive post-surgery for 8 weeks)  Driver: Pincus Large Do you have airbags in your vehicle?  There is a risk of discharging the device if the airbag were to deploy. Yes- understands this risk What do you know about your diet post-surgery? Patient Verbalizes Understanding  of Heart healthy How do you plan to monitor your medications, current and future?  Has been taking out of pill boxes and plans to continue doing so- used to use a pill box but did not like this method.  Interested in blister packs when his medications become stable.  How do you plan to complete ADL's post-surgery?  Hopefully will be somewhat independent but will ask for assistance if needed. Will it be difficult to ask for help from your caregivers?  Has gotten used to having to ask for help when needed so doesn't think it will be an issue for him now.  Please explain what you hope will be improved about your life as a result of receiving the LVAD? Hopeful he will be able to do the things he loves again and be able to return to work.  Wants to prolong his life and hopefully be eligible for transplant so he can be there and see his  family grow up. Please tell me your biggest concern or fear about living with the LVAD?  Worried that it won't make him feel better. How do you cope with your concerns and fears?  Understands there is no way to know what will happen getting the LVAD but understands he won't live much longer without intervention. Please explain your understanding of how their body will change?  Understands he will have wire out of his abdomen and will have to care remote and battery packs. Are you worried about these changes? Only thing that concerns him is how to manage  batteries and carry them around with him the best way. Do you see any barriers to your surgery or follow-up? no  Understanding of LVAD Patient states understanding of the following: Surgical procedures and risks, Electrical need for LVAD (3 prong outlets), Safety precautions with LVAD (water, etc.), LVAD daily self-care (dressing changes, computer check, extra supplies), Outpatient follow up (LVAD clinic appts, monitoring blood thinners), and Need for Emergency Planning  Discussed and Reviewed with Patient and Caregiver  Patient's current level of motivation to prepare for LVAD: 10 out of 10 Patient's present Level of Consent for LVAD: 10 out of 10    Education provided to patient/family/caregiver:   Caregiver role and responsibiltiy, Financial planning for LVAD, Role of Clinical Child psychotherapist, and Signs of Depression and Anxiety    Discussed and Reviewed with Patient and Caregiver  Caregiver questions Please explain what you hope will be improved about your life and loved one's life as a result of receiving the LVAD?  Hopeful he will have increased quality of life and that he will no longer feel like a burden because of his health. What is your biggest concern or fear about caregiving with an LVAD patient?  Worried that it will fix his left side but his right side will weaken and so it won't help him much. What is your plan for availability  to provide care 24/7 x2 weeks post op and dressing changes ongoing?  Works from home and has no other responsibilities outside the home- has no concerns about not being available for the patient as needed. Who is the relief/backup caregiver and what is their availability?  PJ who works 7-5 M-F and Nicki [place] who plans to take FMLA when he gets surgery to help out at home. Preferred method of learning? Written, Verbal, and Hands on  Do you drive? yes How do you handle stressful situations?  Does have diagnosis of bipolar which she has had diagnosed since 2007.  States this makes her mood labile but that it does not affect her ability to do what she needs to.  If she needs a break she will remove herself from stressful situation and can take prescribed medications.  Is not concerned this will affect her ability to caregive. Do you think you can do this? Yes- has extensive caregiving experience from helping her sister with ALS doing trach care, bathing, etc. Is there anything that concerns about caregiving?  no Do you provide caregiving to anyone else?  no  Caregiver's current level of motivation to prepare for LVAD: 10 out of 10 Caregiver's present level of consent for LVAD: 10 out of 10  Clinical Interventions Needed:     CSW will monitor signs and symptoms of depression and assist with adjustment to life with an LVAD. CSW will work with patient on positive body image post transplant.  CSW encouraged attendance with the LVAD Support Group to assist further with adjustment and post implant peer support.   Clinical Impressions/Recommendations:    Mr. Koeppen is a 51 yo male who lives with his wife of 26 years, Joe [name], and their 5 pets (3 dogs, 2 cats).  Mr. Rothbauer and his wife have a large combined family with 6 children and 8 grandchildren between them.  They are all very tight knit and his son, PJ, and his family (wife and two children) are planning to move on the same property as Mr. Etsitty in a few  weeks.  All of the other children also live in [place] though are  throughout the state and only one of the children lives out of state in [place].  During our interview Mr. Raczka was alert and oriented and answered questions appropriately throughout.  Mr. Titzer received a high school diploma and went to some college through graduated without a degree- he reports no issues with reading or writing.  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  Mr. Alpern still has an income through LTD benefits with his job and makes $4915/month from this benefit- it is due to expire in February 2025 but has a Clinical research associate in assisting to prevent this and to help with SSDI claim.  Mr. Kohrs and his wife own their current home which they just built and moved into over the summer.  They report no financial concerns as the wife has two jobs and makes a good income.  Reports compliance with medication management and appointment attendance.  He reports he enjoys doing yardwork and spending time with family- have fun going on cruises and taking long drives with his wife to different destinations.  Mr. holzhauer admits to some feelings of anxiety surrounding current medical condition.  Severity of situation has been a hard adjustment for him and his wife and made them anxious about every symptom he feels.  Overall though he has a strong support system and it feels as if his concerns are appropriate to the situation.  He scored a 1 on his PHQ-2 as he did report feeling somewhat down when being turned away for transplant at this time- will need to lose 25lbs to be eligible to be put on the list.  When he is feeling down his main coping mechanism is to turn to family especially his granddaughter, Dorene Grebe, who provides him a large amount of joy.  He does report interest in joining the LVAD support group and meeting with a current LVAD patient to get more information.  Mr. Augustus was  able to provide an accurate description of the LVAD procedure and subsequent hospital stay. His main concern is the LVAD not working for him but understands it is the best option for him at this time to prolong his life and give him a chance for transplant.  His goal of getting the surgery would be able to return to doing the things he loves, feel better, and get more time and memories with his family.  Patient wife is the identified primary caregiver.  They live together and Pincus Large has no commitments outside of the home so has no concerns with providing 24/7 care for the 2 weeks required.  Joe [name] reports no major health concerns or physical limitations and has access to reliable transportation and would be able to bring the patient to and from appointments post surgery.  She has experience with caregiving for another family member so has no concerns about performing dressing changes or helping with any ADLS that the patient might need assistance with.  PJ is Mr. Kerlin son and is the identified secondary caregiver.  He works 7-5 throughout the week but will be living on the same property and the patient and can be available to help as needed- he has no health conditions or limitations.  The patient and his caregiver have a good understanding of the risks and benefits of the LVAD procedure.  Both patient and caregiver had a high level of motivation to get the surgery if he is approved.  There are no other concerns  identified by the pt and caregiver at this time.  Burna Sis, LCSW Clinical Social Worker [facility] Desk#: [phone] Cell#: [phone]

## 2023-04-21 LAB — LUPUS ANTICOAGULANT PANEL
DRVVT: 51.3 s — ABNORMAL HIGH (ref 0.0–47.0)
PTT Lupus Anticoagulant: 34 s (ref 0.0–43.5)

## 2023-04-21 LAB — DRVVT MIX: dRVVT Mix: 43.4 s — ABNORMAL HIGH (ref 0.0–40.4)

## 2023-04-21 LAB — DRVVT CONFIRM: dRVVT Confirm: 1 {ratio} (ref 0.8–1.2)

## 2023-04-23 ENCOUNTER — Telehealth (HOSPITAL_COMMUNITY): Payer: Self-pay | Admitting: *Deleted

## 2023-04-23 NOTE — Telephone Encounter (Signed)
Spoke with pt regarding [facility] acceptance for VAD. Plan for [facility]HC on 11/11 with admission to follow. Scheduled for implant 11/14. Advised not to take Ozempic today or next Monday. Pt verbalized understanding.   Alyce Pagan RN VAD Coordinator  Office: [phone]  24/7 Pager: [phone]

## 2023-04-26 ENCOUNTER — Encounter (HOSPITAL_COMMUNITY): Payer: Self-pay | Admitting: Unknown Physician Specialty

## 2023-04-26 LAB — FACTOR 5 LEIDEN

## 2023-04-26 NOTE — Progress Notes (Signed)
Received approval for HM 3 implant from [employer]um. Berkley Harvey [id number]. This Auth is valid from today 04/26/23 - 10/23/23.  Carlton Adam RN, BSN VAD Coordinator 24/7 Pager [phone]

## 2023-04-27 ENCOUNTER — Encounter: Payer: Self-pay | Admitting: Cardiothoracic Surgery

## 2023-04-27 ENCOUNTER — Encounter: Payer: Self-pay | Admitting: Internal Medicine

## 2023-04-27 ENCOUNTER — Ambulatory Visit (INDEPENDENT_AMBULATORY_CARE_PROVIDER_SITE_OTHER): Payer: 59 | Admitting: Cardiothoracic Surgery

## 2023-04-27 VITALS — BP 95/62 | HR 69 | Resp 20 | Ht 76.0 in | Wt 308.0 lb

## 2023-04-27 DIAGNOSIS — I5042 Chronic combined systolic (congestive) and diastolic (congestive) heart failure: Secondary | ICD-10-CM

## 2023-04-27 DIAGNOSIS — I255 Ischemic cardiomyopathy: Secondary | ICD-10-CM | POA: Diagnosis not present

## 2023-04-27 NOTE — Progress Notes (Signed)
HPI: I asked the patient and his wife to come in to further discuss the HeartMate 3 VAD implant scheduled for next week to make sure that all of their questions have been answered and they understand the details of the procedure and the expected postoperative hospital recovery.  The patient has stopped his Ozempic and the [facility] and has had no change in symptoms.  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  Criteria for transplant.  Patient denies any change in his symptoms of heart failure or evidence of increased fluid/weight.  He is scheduled for admission on November 11 for right heart cath in preparation for VAD implant on November 14.  His last creatinine was 1.8. His last RV function papi score was 0.8.  This is this patient's second visit after the initial office consultation.  We discussed in further detail the operation of VAD implantation, the location of the surgical incisions, and the expected postoperative ICU stay.  He understands with severe obesity he is at increased risk for pulmonary complications including prolonged ventilator dependence, pneumonia, and infection.  He understands with baseline creatinine of 1.85 and GFR of 43 he is at increased risk for acute renal injury and temporary dialysis.  We discussed the importance of complete security of the driveline and controller to prevent injury and infection of the VAD power cord tunnel.    I explained to the patient that based on the right heart cath he will probably need preoperative IV milrinone to optimize his hemodynamics, RV function, and renal function prior to implantation.  All of the patient's and wife's questions were addressed..    Current Outpatient Medications  Medication Sig Dispense Refill   acetaminophen (TYLENOL) 500 MG tablet Take 1,000 mg by mouth every 6 (six)  hours as needed for moderate pain or headache.     allopurinol (ZYLOPRIM) 100 MG tablet Take 100 mg by mouth daily.     amiodarone (PACERONE) 200 MG tablet Take 2 tablets (400 mg total) by mouth 2 (two) times daily for 7 days, THEN 1 tablet (200 mg total) 2 (two) times daily. 90 tablet 0   Aromatic Inhalants (VICKS VAPOINHALER) INHA Inhale 1 puff into the lungs daily as needed (congestion).     aspirin EC 81 MG tablet Take 1 tablet (81 mg total) by mouth daily. 90 tablet 3   colchicine 0.6 MG tablet Take 0.6 mg by mouth daily as needed (Gout).     diclofenac Sodium (VOLTAREN) 1 % GEL APPLY 1 G TOPICALLY DAILY. (Patient taking differently: Apply 2 g topically daily as needed (for pain).) 100 g 0   empagliflozin (JARDIANCE) 10 MG TABS tablet Take 1 tablet (10 mg total) by mouth daily before breakfast. 30 tablet 11   ENTRESTO 24-26 MG Take 1 tablet by mouth 2 (two) times daily.     escitalopram (LEXAPRO) 10 MG tablet Take 10 mg by mouth at bedtime.     gabapentin (NEURONTIN) 300 MG capsule Take 300 mg by mouth 3 (three) times daily.     levothyroxine (SYNTHROID) 25 MCG tablet Take 25 mcg by mouth daily.     magnesium oxide (MAG-OX) 400 (240 Mg) MG tablet Take 1 tablet (400 mg total) by mouth daily. 30 tablet 5   mexiletine (MEXITIL) 250 MG capsule Take 1 capsule (250 mg total) by mouth 2 (two) times daily. 60 capsule 11  nitroGLYCERIN (NITROSTAT) 0.4 MG SL tablet Place 1 tablet (0.4 mg total) under the tongue every 5 (five) minutes as needed for chest pain. 25 tablet 3   omeprazole (PRILOSEC) 40 MG capsule Take 1 capsule (40 mg total) by mouth daily. 90 capsule 1   OZEMPIC, 1 MG/DOSE, 4 MG/3ML SOPN Inject 1 mg into the skin every Monday.     Polyethylene Glycol 400 (BLINK TEARS OP) Place 2 drops into both eyes 4 (four) times daily as needed (for dry eyes).     potassium chloride SA (KLOR-CON M) 20 MEQ tablet Take 2 tablets (40 mEq total) by mouth 3 (three) times daily. 540 tablet 1   ranolazine  (RANEXA) 500 MG 12 hr tablet Take 1 tablet (500 mg total) by mouth 2 (two) times daily. 180 tablet 2   rosuvastatin (CRESTOR) 40 MG tablet Take 1 tablet (40 mg total) by mouth daily. (Patient taking differently: Take 40 mg by mouth at bedtime.) 90 tablet 2   spironolactone (ALDACTONE) 25 MG tablet Take 1 tablet (25 mg total) by mouth daily. 30 tablet 5   torsemide (DEMADEX) 20 MG tablet Take 3 tablets (60 mg total) by mouth 2 (two) times daily. 540 tablet 1   triamcinolone cream (KENALOG) 0.1 % Apply 1 Application topically 2 (two) times daily.     No current facility-administered medications for this visit.     Physical Exam: Blood pressure 95/62, pulse 69, resp. rate 20, height 6\' 4"  (1.93 m), weight (!) 308 lb (139.7 kg), SpO2 94%.        Exam    General- alert and comfortable    Neck- no JVD, no cervical adenopathy palpable, no carotid bruit   Lungs- clear without rales, wheezes   Cor- regular rate and rhythm, no murmur , gallop   Abdomen- soft, obese, non-tender   Extremities - warm, non-tender, 2+ ankle edema with venous stasis right greater than left legs   Neuro- oriented, appropriate, no focal weakness  Diagnostic Tests: Recent echocardiogram images and CT scan of chest personally reviewed.  Severe LV dysfunction with dilatation of the LV chamber and mild-moderate MR, TR.  Impression: Advanced heart failure from ischemic cardiomyopathy with morbid obesity and stage III chronic kidney disease  Plan: Patient will be admitted for preparation of HeartMate 3 VAD implant scheduled on November 14.   Lovett Sox, MD [facility] [phone]

## 2023-04-30 ENCOUNTER — Inpatient Hospital Stay (HOSPITAL_COMMUNITY)
Admission: RE | Admit: 2023-04-30 | Discharge: 2023-05-21 | DRG: 001 | Disposition: A | Discharge status: Home / Self Care | Payer: 59 | Attending: Cardiology | Admitting: Cardiology

## 2023-04-30 ENCOUNTER — Other Ambulatory Visit: Payer: Self-pay

## 2023-04-30 ENCOUNTER — Encounter (HOSPITAL_COMMUNITY)
Admission: RE | Disposition: A | Discharge status: Home / Self Care | Payer: Self-pay | Source: Home / Self Care | Attending: Internal Medicine

## 2023-04-30 ENCOUNTER — Encounter (HOSPITAL_COMMUNITY): Payer: Self-pay | Admitting: Internal Medicine

## 2023-04-30 DIAGNOSIS — Z7189 Other specified counseling: Secondary | ICD-10-CM

## 2023-04-30 DIAGNOSIS — Z833 Family history of diabetes mellitus: Secondary | ICD-10-CM

## 2023-04-30 DIAGNOSIS — Z95810 Presence of automatic (implantable) cardiac defibrillator: Secondary | ICD-10-CM | POA: Diagnosis not present

## 2023-04-30 DIAGNOSIS — Z1152 Encounter for screening for COVID-19: Secondary | ICD-10-CM | POA: Diagnosis not present

## 2023-04-30 DIAGNOSIS — I25110 Atherosclerotic heart disease of native coronary artery with unstable angina pectoris: Secondary | ICD-10-CM | POA: Diagnosis not present

## 2023-04-30 DIAGNOSIS — I5023 Acute on chronic systolic (congestive) heart failure: Secondary | ICD-10-CM | POA: Diagnosis not present

## 2023-04-30 DIAGNOSIS — Z79890 Hormone replacement therapy (postmenopausal): Secondary | ICD-10-CM | POA: Diagnosis not present

## 2023-04-30 DIAGNOSIS — E7800 Pure hypercholesterolemia, unspecified: Secondary | ICD-10-CM | POA: Diagnosis present

## 2023-04-30 DIAGNOSIS — I472 Ventricular tachycardia, unspecified: Secondary | ICD-10-CM

## 2023-04-30 DIAGNOSIS — R570 Cardiogenic shock: Secondary | ICD-10-CM | POA: Diagnosis not present

## 2023-04-30 DIAGNOSIS — I25118 Atherosclerotic heart disease of native coronary artery with other forms of angina pectoris: Secondary | ICD-10-CM | POA: Diagnosis present

## 2023-04-30 DIAGNOSIS — Z95811 Presence of heart assist device: Secondary | ICD-10-CM | POA: Diagnosis not present

## 2023-04-30 DIAGNOSIS — E119 Type 2 diabetes mellitus without complications: Secondary | ICD-10-CM | POA: Diagnosis present

## 2023-04-30 DIAGNOSIS — Z85828 Personal history of other malignant neoplasm of skin: Secondary | ICD-10-CM | POA: Diagnosis not present

## 2023-04-30 DIAGNOSIS — E669 Obesity, unspecified: Secondary | ICD-10-CM | POA: Diagnosis present

## 2023-04-30 DIAGNOSIS — I5020 Unspecified systolic (congestive) heart failure: Principal | ICD-10-CM

## 2023-04-30 DIAGNOSIS — Z955 Presence of coronary angioplasty implant and graft: Secondary | ICD-10-CM

## 2023-04-30 DIAGNOSIS — I255 Ischemic cardiomyopathy: Secondary | ICD-10-CM | POA: Diagnosis present

## 2023-04-30 DIAGNOSIS — N529 Male erectile dysfunction, unspecified: Secondary | ICD-10-CM | POA: Diagnosis present

## 2023-04-30 DIAGNOSIS — G4733 Obstructive sleep apnea (adult) (pediatric): Secondary | ICD-10-CM | POA: Diagnosis present

## 2023-04-30 DIAGNOSIS — N179 Acute kidney failure, unspecified: Secondary | ICD-10-CM | POA: Diagnosis present

## 2023-04-30 DIAGNOSIS — E782 Mixed hyperlipidemia: Secondary | ICD-10-CM

## 2023-04-30 DIAGNOSIS — R443 Hallucinations, unspecified: Secondary | ICD-10-CM | POA: Diagnosis not present

## 2023-04-30 DIAGNOSIS — Z8674 Personal history of sudden cardiac arrest: Secondary | ICD-10-CM

## 2023-04-30 DIAGNOSIS — Z8249 Family history of ischemic heart disease and other diseases of the circulatory system: Secondary | ICD-10-CM

## 2023-04-30 DIAGNOSIS — I5082 Biventricular heart failure: Secondary | ICD-10-CM | POA: Diagnosis present

## 2023-04-30 DIAGNOSIS — Z7985 Long-term (current) use of injectable non-insulin antidiabetic drugs: Secondary | ICD-10-CM

## 2023-04-30 DIAGNOSIS — Z79899 Other long term (current) drug therapy: Secondary | ICD-10-CM

## 2023-04-30 DIAGNOSIS — Z7984 Long term (current) use of oral hypoglycemic drugs: Secondary | ICD-10-CM

## 2023-04-30 DIAGNOSIS — I5022 Chronic systolic (congestive) heart failure: Secondary | ICD-10-CM | POA: Diagnosis present

## 2023-04-30 DIAGNOSIS — Z515 Encounter for palliative care: Secondary | ICD-10-CM | POA: Diagnosis not present

## 2023-04-30 DIAGNOSIS — Z7982 Long term (current) use of aspirin: Secondary | ICD-10-CM

## 2023-04-30 DIAGNOSIS — Z6838 Body mass index (BMI) 38.0-38.9, adult: Secondary | ICD-10-CM | POA: Diagnosis not present

## 2023-04-30 DIAGNOSIS — E876 Hypokalemia: Secondary | ICD-10-CM | POA: Diagnosis present

## 2023-04-30 DIAGNOSIS — I5084 End stage heart failure: Secondary | ICD-10-CM | POA: Diagnosis present

## 2023-04-30 DIAGNOSIS — E291 Testicular hypofunction: Secondary | ICD-10-CM | POA: Diagnosis present

## 2023-04-30 DIAGNOSIS — I4510 Unspecified right bundle-branch block: Secondary | ICD-10-CM | POA: Diagnosis present

## 2023-04-30 DIAGNOSIS — I110 Hypertensive heart disease with heart failure: Secondary | ICD-10-CM | POA: Diagnosis present

## 2023-04-30 DIAGNOSIS — M722 Plantar fascial fibromatosis: Secondary | ICD-10-CM | POA: Diagnosis present

## 2023-04-30 DIAGNOSIS — R059 Cough, unspecified: Secondary | ICD-10-CM | POA: Diagnosis not present

## 2023-04-30 DIAGNOSIS — I493 Ventricular premature depolarization: Secondary | ICD-10-CM | POA: Diagnosis present

## 2023-04-30 DIAGNOSIS — M7989 Other specified soft tissue disorders: Secondary | ICD-10-CM | POA: Diagnosis not present

## 2023-04-30 DIAGNOSIS — I5021 Acute systolic (congestive) heart failure: Secondary | ICD-10-CM | POA: Diagnosis not present

## 2023-04-30 DIAGNOSIS — I509 Heart failure, unspecified: Secondary | ICD-10-CM | POA: Diagnosis not present

## 2023-04-30 DIAGNOSIS — K219 Gastro-esophageal reflux disease without esophagitis: Secondary | ICD-10-CM | POA: Diagnosis present

## 2023-04-30 DIAGNOSIS — R5381 Other malaise: Secondary | ICD-10-CM | POA: Diagnosis not present

## 2023-04-30 HISTORY — PX: RIGHT HEART CATH: CATH118263

## 2023-04-30 LAB — POCT I-STAT EG7
Acid-Base Excess: 1 mmol/L (ref 0.0–2.0)
Acid-Base Excess: 4 mmol/L — ABNORMAL HIGH (ref 0.0–2.0)
Bicarbonate: 26.3 mmol/L (ref 20.0–28.0)
Bicarbonate: 30.1 mmol/L — ABNORMAL HIGH (ref 20.0–28.0)
Calcium, Ion: 0.93 mmol/L — ABNORMAL LOW (ref 1.15–1.40)
Calcium, Ion: 1.15 mmol/L (ref 1.15–1.40)
HCT: 35 % — ABNORMAL LOW (ref 39.0–52.0)
HCT: 40 % (ref 39.0–52.0)
Hemoglobin: 11.9 g/dL — ABNORMAL LOW (ref 13.0–17.0)
Hemoglobin: 13.6 g/dL (ref 13.0–17.0)
O2 Saturation: 57 %
O2 Saturation: 62 %
Potassium: 3.1 mmol/L — ABNORMAL LOW (ref 3.5–5.1)
Potassium: 3.8 mmol/L (ref 3.5–5.1)
Sodium: 140 mmol/L (ref 135–145)
Sodium: 145 mmol/L (ref 135–145)
TCO2: 28 mmol/L (ref 22–32)
TCO2: 32 mmol/L (ref 22–32)
pCO2, Ven: 45.5 mm[Hg] (ref 44–60)
pCO2, Ven: 51.9 mm[Hg] (ref 44–60)
pH, Ven: 7.369 (ref 7.25–7.43)
pH, Ven: 7.372 (ref 7.25–7.43)
pO2, Ven: 31 mm[Hg] — CL (ref 32–45)
pO2, Ven: 33 mm[Hg] (ref 32–45)

## 2023-04-30 LAB — GLUCOSE, CAPILLARY
Glucose-Capillary: 148 mg/dL — ABNORMAL HIGH (ref 70–99)
Glucose-Capillary: 122 mg/dL — ABNORMAL HIGH (ref 70–99)

## 2023-04-30 LAB — COOXEMETRY PANEL
Carboxyhemoglobin: 0.9 % (ref 0.5–1.5)
Methemoglobin: <0.7 % (ref 0.0–1.5)
O2 Saturation: 58.8 %
Total hemoglobin: 14.5 g/dL (ref 12.0–16.0)

## 2023-04-30 LAB — MRSA NEXT GEN BY PCR, NASAL: MRSA by PCR Next Gen: NOT DETECTED

## 2023-04-30 SURGERY — RIGHT HEART CATH
Anesthesia: LOCAL

## 2023-04-30 MED ORDER — ENOXAPARIN SODIUM 40 MG/0.4ML IJ SOSY
40.0000 mg | PREFILLED_SYRINGE | INTRAMUSCULAR | Status: DC
  Administered 2023-04-30: 40 mg via SUBCUTANEOUS

## 2023-04-30 MED ORDER — SODIUM CHLORIDE 0.9% FLUSH: 10.0000 mL | Freq: Two times a day (BID) | INTRAVENOUS | Status: DC

## 2023-04-30 MED ORDER — PANTOPRAZOLE SODIUM 40 MG PO TBEC
40.0000 mg | DELAYED_RELEASE_TABLET | Freq: Every day | ORAL | Status: DC
  Administered 2023-04-30 – 2023-05-02 (×3): 40 mg via ORAL
  Filled 2023-04-30 (×3): qty 1

## 2023-04-30 MED ORDER — SODIUM CHLORIDE 0.9% FLUSH
10.0000 mL | INTRAVENOUS | Status: DC | PRN
  Administered 2023-04-30: 10 mL

## 2023-04-30 MED ORDER — SODIUM CHLORIDE 0.9% FLUSH
3.0000 mL | Freq: Two times a day (BID) | INTRAVENOUS | Status: DC
  Administered 2023-04-30 – 2023-05-06 (×7): 3 mL via INTRAVENOUS

## 2023-04-30 MED ORDER — ROSUVASTATIN CALCIUM 20 MG PO TABS
40.0000 mg | ORAL_TABLET | Freq: Every day | ORAL | Status: DC
  Administered 2023-04-30 – 2023-05-02 (×3): 40 mg via ORAL
  Filled 2023-04-30 (×3): qty 2

## 2023-04-30 MED ORDER — MIDAZOLAM HCL 2 MG/2ML IJ SOLN
INTRAMUSCULAR | Status: DC | PRN
  Administered 2023-04-30: 2 mg via INTRAVENOUS

## 2023-04-30 MED ORDER — MIDAZOLAM HCL 2 MG/2ML IJ SOLN
INTRAMUSCULAR | Status: AC
  Filled 2023-04-30: qty 2

## 2023-04-30 MED ORDER — SODIUM CHLORIDE 0.9% FLUSH
10.0000 mL | Freq: Two times a day (BID) | INTRAVENOUS | Status: DC
  Administered 2023-04-30: 30 mL
  Administered 2023-05-01 – 2023-05-06 (×10): 10 mL
  Administered 2023-05-07: 20 mL
  Administered 2023-05-07 – 2023-05-21 (×24): 10 mL

## 2023-04-30 MED ORDER — ACETAMINOPHEN 325 MG PO TABS
650.0000 mg | ORAL_TABLET | ORAL | Status: DC | PRN
  Administered 2023-05-09 – 2023-05-17 (×9): 650 mg via ORAL
  Filled 2023-04-30 (×9): qty 2

## 2023-04-30 MED ORDER — SODIUM CHLORIDE 0.9 % IV SOLN: 250.0000 mL | INTRAVENOUS | Status: AC | PRN

## 2023-04-30 MED ORDER — MAGNESIUM OXIDE -MG SUPPLEMENT 400 (240 MG) MG PO TABS
400.0000 mg | ORAL_TABLET | Freq: Every day | ORAL | Status: DC
  Administered 2023-04-30 – 2023-05-02 (×3): 400 mg via ORAL
  Filled 2023-04-30 (×3): qty 1

## 2023-04-30 MED ORDER — TORSEMIDE 20 MG PO TABS
60.0000 mg | ORAL_TABLET | Freq: Two times a day (BID) | ORAL | Status: DC
  Administered 2023-04-30 – 2023-05-02 (×5): 60 mg via ORAL
  Filled 2023-04-30 (×5): qty 3

## 2023-04-30 MED ORDER — SACUBITRIL-VALSARTAN 24-26 MG PO TABS
1.0000 | ORAL_TABLET | Freq: Two times a day (BID) | ORAL | Status: DC
  Filled 2023-04-30: qty 1

## 2023-04-30 MED ORDER — RANOLAZINE ER 500 MG PO TB12
500.0000 mg | ORAL_TABLET | Freq: Two times a day (BID) | ORAL | Status: DC
  Administered 2023-04-30 – 2023-05-02 (×6): 500 mg via ORAL
  Filled 2023-04-30 (×7): qty 1

## 2023-04-30 MED ORDER — SODIUM CHLORIDE 0.9% FLUSH
3.0000 mL | Freq: Two times a day (BID) | INTRAVENOUS | Status: DC
  Administered 2023-05-01 – 2023-05-06 (×7): 3 mL via INTRAVENOUS

## 2023-04-30 MED ORDER — FENTANYL CITRATE (PF) 100 MCG/2ML IJ SOLN
INTRAMUSCULAR | Status: DC | PRN
  Administered 2023-04-30: 25 ug via INTRAVENOUS

## 2023-04-30 MED ORDER — LEVOTHYROXINE SODIUM 25 MCG PO TABS
25.0000 ug | ORAL_TABLET | Freq: Every day | ORAL | Status: DC
  Administered 2023-04-30 – 2023-05-02 (×3): 25 ug via ORAL
  Filled 2023-04-30 (×3): qty 1

## 2023-04-30 MED ORDER — HYDRALAZINE HCL 20 MG/ML IJ SOLN
10.0000 mg | INTRAMUSCULAR | Status: AC | PRN
Start: 2023-04-30 — End: 2023-04-30

## 2023-04-30 MED ORDER — ASPIRIN 81 MG PO TBEC
81.0000 mg | DELAYED_RELEASE_TABLET | Freq: Every day | ORAL | Status: DC
  Administered 2023-05-01 – 2023-05-02 (×2): 81 mg via ORAL
  Filled 2023-04-30 (×3): qty 1

## 2023-04-30 MED ORDER — GABAPENTIN 300 MG PO CAPS
300.0000 mg | ORAL_CAPSULE | Freq: Three times a day (TID) | ORAL | Status: DC
  Administered 2023-04-30 – 2023-05-02 (×8): 300 mg via ORAL
  Filled 2023-04-30 (×8): qty 1

## 2023-04-30 MED ORDER — POTASSIUM CHLORIDE CRYS ER 20 MEQ PO TBCR
40.0000 meq | EXTENDED_RELEASE_TABLET | Freq: Three times a day (TID) | ORAL | Status: DC
  Administered 2023-04-30 – 2023-05-02 (×8): 40 meq via ORAL
  Filled 2023-04-30 (×8): qty 2

## 2023-04-30 MED ORDER — ACETAMINOPHEN 500 MG PO TABS: 1000.0000 mg | ORAL_TABLET | Freq: Four times a day (QID) | ORAL | Status: DC | PRN

## 2023-04-30 MED ORDER — POLYVINYL ALCOHOL 1.4 % OP SOLN: 2.0000 [drp] | Freq: Four times a day (QID) | OPHTHALMIC | Status: DC | PRN

## 2023-04-30 MED ORDER — ACETAMINOPHEN 325 MG PO TABS: 650.0000 mg | ORAL_TABLET | ORAL | Status: DC | PRN

## 2023-04-30 MED ORDER — MILRINONE LACTATE IN DEXTROSE 20-5 MG/100ML-% IV SOLN
0.1250 ug/kg/min | INTRAVENOUS | Status: DC
  Administered 2023-04-30 – 2023-05-04 (×6): 0.125 ug/kg/min via INTRAVENOUS
  Filled 2023-04-30 (×3): qty 100
  Filled 2023-04-30: qty 200
  Filled 2023-04-30 (×2): qty 100

## 2023-04-30 MED ORDER — ENOXAPARIN SODIUM 40 MG/0.4ML IJ SOSY
40.0000 mg | PREFILLED_SYRINGE | INTRAMUSCULAR | Status: DC
  Administered 2023-05-01: 40 mg via SUBCUTANEOUS
  Filled 2023-04-30 (×2): qty 0.4

## 2023-04-30 MED ORDER — FENTANYL CITRATE (PF) 100 MCG/2ML IJ SOLN
INTRAMUSCULAR | Status: AC
  Filled 2023-04-30: qty 2

## 2023-04-30 MED ORDER — HEPARIN (PORCINE) IN NACL 1000-0.9 UT/500ML-% IV SOLN
INTRAVENOUS | Status: DC | PRN
  Administered 2023-04-30: 500 mL

## 2023-04-30 MED ORDER — LIDOCAINE HCL (PF) 1 % IJ SOLN
INTRAMUSCULAR | Status: AC
  Filled 2023-04-30: qty 30

## 2023-04-30 MED ORDER — ORAL CARE MOUTH RINSE: 15.0000 mL | OROMUCOSAL | Status: DC | PRN

## 2023-04-30 MED ORDER — LABETALOL HCL 5 MG/ML IV SOLN
10.0000 mg | INTRAVENOUS | Status: AC | PRN
Start: 2023-04-30 — End: 2023-04-30

## 2023-04-30 MED ORDER — ESCITALOPRAM OXALATE 10 MG PO TABS
10.0000 mg | ORAL_TABLET | Freq: Every day | ORAL | Status: DC
  Administered 2023-04-30 – 2023-05-02 (×3): 10 mg via ORAL
  Filled 2023-04-30 (×3): qty 1

## 2023-04-30 MED ORDER — MEXILETINE HCL 250 MG PO CAPS
250.0000 mg | ORAL_CAPSULE | Freq: Two times a day (BID) | ORAL | Status: DC
  Administered 2023-04-30 – 2023-05-02 (×5): 250 mg via ORAL
  Filled 2023-04-30 (×9): qty 1

## 2023-04-30 MED ORDER — SODIUM CHLORIDE 0.9% FLUSH
3.0000 mL | INTRAVENOUS | Status: DC | PRN
Start: 2023-04-30 — End: 2023-05-07

## 2023-04-30 MED ORDER — SPIRONOLACTONE 25 MG PO TABS
25.0000 mg | ORAL_TABLET | Freq: Every day | ORAL | Status: DC
  Administered 2023-04-30 – 2023-05-02 (×3): 25 mg via ORAL
  Filled 2023-04-30 (×3): qty 1

## 2023-04-30 MED ORDER — CHLORHEXIDINE GLUCONATE CLOTH 2 % EX PADS
6.0000 | MEDICATED_PAD | Freq: Every day | CUTANEOUS | Status: DC
  Administered 2023-04-30 – 2023-05-21 (×23): 6 via TOPICAL

## 2023-04-30 MED ORDER — LIDOCAINE HCL (PF) 1 % IJ SOLN
INTRAMUSCULAR | Status: DC | PRN
  Administered 2023-04-30: 5 mL

## 2023-04-30 MED ORDER — EMPAGLIFLOZIN 10 MG PO TABS
10.0000 mg | ORAL_TABLET | Freq: Every day | ORAL | Status: DC
  Filled 2023-04-30: qty 1

## 2023-04-30 MED ORDER — ALLOPURINOL 100 MG PO TABS
100.0000 mg | ORAL_TABLET | Freq: Every day | ORAL | Status: DC
  Administered 2023-04-30 – 2023-05-02 (×3): 100 mg via ORAL
  Filled 2023-04-30 (×3): qty 1

## 2023-04-30 MED ORDER — AMIODARONE HCL 200 MG PO TABS
200.0000 mg | ORAL_TABLET | Freq: Two times a day (BID) | ORAL | Status: DC
  Administered 2023-04-30 – 2023-05-02 (×6): 200 mg via ORAL
  Filled 2023-04-30 (×6): qty 1

## 2023-04-30 SURGICAL SUPPLY — 6 items
CATH SWAN GANZ 7F STRAIGHT (CATHETERS) ×1
PACK CARDIAC CATHETERIZATION (CUSTOM PROCEDURE TRAY) ×1
SHEATH PINNACLE 7F 10CM (SHEATH) ×1
SHIELD CATH-GARD CONTAMINATION (MISCELLANEOUS) ×1
TRANSDUCER W/STOPCOCK (MISCELLANEOUS) ×1
TUBING ART PRESS 72 MALE/FEM (TUBING) ×1

## 2023-04-30 NOTE — Interval H&P Note (Signed)
History and Physical Interval Note:  04/30/2023 10:59 AM  Joe [name]  has presented today for surgery, with the diagnosis of Heart Failure.  The various methods of treatment have been discussed with the patient and family. After consideration of risks, benefits and other options for treatment, the patient has consented to  Procedure(s): RIGHT HEART CATH (N/A) as a surgical intervention.  The patient's history has been reviewed, patient examined, no change in status, stable for surgery.  I have reviewed the patient's chart and labs.  Questions were answered to the patient's satisfaction.     [name]

## 2023-04-30 NOTE — Plan of Care (Signed)
  Problem: Education: Goal: Understanding of CV disease, CV risk reduction, and recovery process will improve Outcome: Progressing Goal: Individualized Educational Video(s) Outcome: Progressing   Problem: Activity: Goal: Ability to return to baseline activity level will improve Outcome: Progressing   Problem: Cardiovascular: Goal: Ability to achieve and maintain adequate cardiovascular perfusion will improve Outcome: Progressing Goal: Vascular access site(s) Level 0-1 will be maintained Outcome: Progressing   Problem: Health Behavior/Discharge Planning: Goal: Ability to safely manage health-related needs after discharge will improve Outcome: Progressing   Problem: Clinical Measurements: Goal: Ability to maintain clinical measurements within normal limits will improve Outcome: Progressing Goal: Will remain free from infection Outcome: Progressing Goal: Diagnostic test results will improve Outcome: Progressing Goal: Respiratory complications will improve Outcome: Progressing Goal: Cardiovascular complication will be avoided Outcome: Progressing   Problem: Safety: Goal: Ability to remain free from injury will improve Outcome: Progressing   Problem: Skin Integrity: Goal: Risk for impaired skin integrity will decrease Outcome: Progressing

## 2023-04-30 NOTE — H&P (Signed)
Advanced Heart Failure Team History and Physical Note   PCP:  Lonie Peak, PA-C  PCP-Cardiology: [name], MD     Reason for Admission:    HPI:    Joe [name] is a 52 y.o. male with h/o morbid obesity, DM2, HTN, HL, OSA, CAD and systolic HF (EF 20-25%) referred by Dr. Bing Matter for further management of his HF    Has h/o RBBB but never had any other heart problems until 3/19. Saw Dr. Mauri Brooklyn in 3/19 due to CP and SOB. Cath EF 25-35% Severe diabetic CAD with LAD disease (m90 and m75),  RCA d50, LCA m50.    Echo 7/20 EF 20-25% moderate RV dysfunction   Cath January 2020 which showed 40% distal LAD, LAD/LCx stents were patent, mid RCA got 25% lesion right PDA had 50% stenosis the circumflex artery were open.  No intervention has been required.    Had cath 1/22 with stable CAD   Myoview 12/19 EF 30% Apical and infero-apical scar   Had St. Jude CRT-D placed 03/19/19 with Dr. Elberta Fortis.   Admitted 9/22 for CP. HsTn was 4. Underwent cardiac cath on 03/07/2021 which showed patent stents in the LAD and LCX with diffuse distal LAD which may be a trigger for angina as well as mildly elevated RA pressure with normal PCWP and LVEDP and preserved cardiac output.    Echo 9/22 EF 35-40%   Admitted 6/23 with CP. Hstrop 35. Felt to be non-cardiac. Echo EF 30-35%   Seen in ED 04/10/22 with SOB, found to be volume overloaded. BNP only 172. Hst trop 37->47. CXR with pulmonary edema. Given IV lasix with > 2L out. Refused hospital admission, had been in [facility] x to get fluids back.   He's had recurrent volume overload over the last few months requiring frequent diuretic adjustments. NYHA IIIb/IV.    Follow up 8/24, he was markedly volume overloaded. ReDS 47%. Declined admission. Torsemide increased to 80 mg daily and instructed to take 5 mg metolazone daily X 3 days. Coreg stopped. Echo with EF 20-25%. Close follow up 9/24, down 33 lbs but still with volume overload and NYHA IIIb  symptoms. Discussion had about work up for advanced therapies. Given age favor transplant eval but BMI may be prohibitive currently, and VAD may be better option.    Underwent R/LHC (9/24) showing stable 3v CAD, severe biventricular failure with elevated filling pressures and very low PAPi suggestive of poor RV function. Not VAD candidate with poor RV function. Torsemide increased to 40 bid, and weekly metolazone added.   Admitted 10/10 - 04/01/23 for ICD shock in setting of VF. Loaded on IV amiodarone.    Seen at [facility] on 04/02/23 for transplant evaluation. CXP was done. Plan, per pt report, is for [facility] to discuss candidacy soon.   Readmitted for VT treated w/ ICD shock. He apparently briefly lost consciousness and had brief chest compressions by his brother-in law. Regained consciousness after his ICD fired twice. K 2.5 on admit.  Weight up 15 lbs above baseline.  Lytes repleted, diuresed and GDMT titrated. Discharged home, weight 305 lbs.   Plan for VAD on Thursday. Had pre-VAD RHC today with improved numbers but RV still marginal. Being admitted for milrinone optimization.   RA = 9 RV = 42/12 PA = 38/16 (28) PCW = 19 (v = 26) Fick cardiac output/index = 4.9/1.9 Thermo CO/CI = 5.7/2.2 PVR = 1.6 WU Ao sat = 94% PA sat = 57%, 62% PAPi = 2.4  Home Medications Prior to Admission medications   Medication Sig Start Date End Date Taking? Authorizing Provider  acetaminophen (TYLENOL) 500 MG tablet Take 1,000 mg by mouth every 6 (six) hours as needed for moderate pain or headache.   Yes [provider]  allopurinol (ZYLOPRIM) 100 MG tablet Take 100 mg by mouth daily.   Yes [provider]  amiodarone (PACERONE) 200 MG tablet Take 2 tablets (400 mg total) by mouth 2 (two) times daily for 7 days, THEN 1 tablet (200 mg total) 2 (two) times daily. 04/12/23 05/20/23 Yes Simmons, Brittainy M, PA-C  Aromatic Inhalants (VICKS VAPOINHALER) INHA Inhale 1 puff into the lungs daily as  needed (congestion).   Yes [provider]  aspirin EC 81 MG tablet Take 1 tablet (81 mg total) by mouth daily. 09/05/17  Yes Georgeanna Lea, MD  colchicine 0.6 MG tablet Take 0.6 mg by mouth daily as needed (Gout). 05/10/20  Yes [provider]  diclofenac Sodium (VOLTAREN) 1 % GEL APPLY 1 G TOPICALLY DAILY. Patient taking differently: Apply 2 g topically daily as needed (for pain). 01/11/23  Yes Standiford, Jenelle Mages, DPM  empagliflozin (JARDIANCE) 10 MG TABS tablet Take 1 tablet (10 mg total) by mouth daily before breakfast. 02/23/23  Yes Andrey Farmer, PA-C  escitalopram (LEXAPRO) 10 MG tablet Take 10 mg by mouth at bedtime. 06/24/21  Yes [provider]  gabapentin (NEURONTIN) 300 MG capsule Take 300 mg by mouth 3 (three) times daily. 04/16/23  Yes [provider]  levothyroxine (SYNTHROID) 25 MCG tablet Take 25 mcg by mouth daily. 04/17/23  Yes [provider]  magnesium oxide (MAG-OX) 400 (240 Mg) MG tablet Take 1 tablet (400 mg total) by mouth daily. 04/13/23  Yes Robbie Lis M, PA-C  mexiletine (MEXITIL) 250 MG capsule Take 1 capsule (250 mg total) by mouth 2 (two) times daily. 12/18/22  Yes Camnitz, Will Daphine Deutscher, MD  omeprazole (PRILOSEC) 40 MG capsule Take 1 capsule (40 mg total) by mouth daily. 01/14/19  Yes Georgeanna Lea, MD  Polyethylene Glycol 400 (BLINK TEARS OP) Place 2 drops into both eyes 4 (four) times daily as needed (for dry eyes).   Yes [provider]  potassium chloride SA (KLOR-CON M) 20 MEQ tablet Take 2 tablets (40 mEq total) by mouth 3 (three) times daily. 04/12/23  Yes Robbie Lis M, PA-C  ranolazine (RANEXA) 500 MG 12 hr tablet Take 1 tablet (500 mg total) by mouth 2 (two) times daily. 07/18/22  Yes Georgeanna Lea, MD  rosuvastatin (CRESTOR) 40 MG tablet Take 1 tablet (40 mg total) by mouth daily. Patient taking differently: Take 40 mg by mouth at bedtime. 07/18/22  Yes Georgeanna Lea, MD  spironolactone (ALDACTONE) 25 MG tablet Take 1 tablet (25 mg total) by mouth daily. 04/13/23  Yes Sharol Harness, Brittainy M, PA-C  torsemide (DEMADEX) 20 MG tablet Take 3 tablets (60 mg total) by mouth 2 (two) times daily. 04/12/23  Yes Robbie Lis M, PA-C  triamcinolone cream (KENALOG) 0.1 % Apply 1 Application topically 2 (two) times daily. 10/09/22  Yes [provider]  ENTRESTO 24-26 MG Take 1 tablet by mouth 2 (two) times daily. 04/10/23   [provider]  nitroGLYCERIN (NITROSTAT) 0.4 MG SL tablet Place 1 tablet (0.4 mg total) under the tongue every 5 (five) minutes as needed for chest pain. 03/16/23   Milford, Anderson Malta, FNP  OZEMPIC, 1 MG/DOSE, 4 MG/3ML SOPN Inject 1 mg into the skin every  Monday. 12/28/22   [provider]    Past Medical History: Past Medical History:  Diagnosis Date   Abnormal EKG    Acute systolic heart failure (HCC) 09/06/2017   Angina pectoris (HCC) 09/05/2017   Body mass index 45.0-49.9, adult (HCC)    CAD S/P percutaneous coronary angioplasty 09/07/2017   mLAD and dLAD PCI with DES 09/06/17   CHF (congestive heart failure) (HCC)    Chronic systolic (congestive) heart failure (HCC) 03/19/2019   Complication of anesthesia 1995   "had hard time waking up; breathing w/vasectomy"   Coronary artery disease    Erectile dysfunction    Essential hypertension, benign    Fatigue    GERD (gastroesophageal reflux disease)    Gout    High cholesterol    "just started tx yesterday" (09/06/2017)   History of gout    "RX prn" (09/06/2017)   Ischemic cardiomyopathy 09/07/2017   EF 25-35%   Muscular chest pain    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   Below Left Nare (MOH's)   Obesity    Obstructive sleep apnea    OSA on CPAP    Plantar fasciitis    Pre-operative clearance 02/11/2018   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/02/2019   Saint Jude   Right bundle branch block    Shortness of breath  09/05/2017   Testosterone deficiency    Type 2 diabetes mellitus with complication, without long-term current use of insulin (HCC) 09/05/2017   Type II diabetes mellitus (HCC)    "started tx 08/28/2017"    Past Surgical History: Past Surgical History:  Procedure Laterality Date   BACK SURGERY     BIV ICD INSERTION CRT-D N/A 03/19/2019   Procedure: BIV ICD INSERTION CRT-D;  Surgeon: Regan Lemming, MD;  Location: [facility] INVASIVE CV LAB;  Service: Cardiovascular;  Laterality: N/A;   CORONARY ANGIOPLASTY WITH STENT PLACEMENT  09/06/2017   "3 stents"   CORONARY STENT INTERVENTION N/A 09/06/2017   Procedure: CORONARY STENT INTERVENTION;  Surgeon: Corky Crafts, MD;  Location: [facility] INVASIVE CV LAB;  Service: Cardiovascular;  Laterality: N/A;   CORONARY STENT INTERVENTION N/A 02/15/2018   Procedure: CORONARY STENT INTERVENTION;  Surgeon: Corky Crafts, MD;  Location: [facility] INVASIVE CV LAB;  Service: Cardiovascular;  Laterality: N/A;   KNEE ARTHROSCOPY Right    LEFT HEART CATH AND CORONARY ANGIOGRAPHY N/A 09/06/2017   Procedure: LEFT HEART CATH AND CORONARY ANGIOGRAPHY;  Surgeon: Corky Crafts, MD;  Location: [facility] INVASIVE CV LAB;  Service: Cardiovascular;  Laterality: N/A;   LEFT HEART CATH AND CORONARY ANGIOGRAPHY N/A 02/15/2018   Procedure: LEFT HEART CATH AND CORONARY ANGIOGRAPHY;  Surgeon: Corky Crafts, MD;  Location: [facility] INVASIVE CV LAB;  Service: Cardiovascular;  Laterality: N/A;   LEFT HEART CATH AND CORONARY ANGIOGRAPHY N/A 06/27/2018   Procedure: LEFT HEART CATH AND CORONARY ANGIOGRAPHY;  Surgeon: Corky Crafts, MD;  Location: [facility] INVASIVE CV LAB;  Service: Cardiovascular;  Laterality: N/A;   LEFT HEART CATH AND CORONARY ANGIOGRAPHY N/A 06/28/2020   Procedure: LEFT HEART CATH AND CORONARY ANGIOGRAPHY;  Surgeon: Swaziland, Peter M, MD;  Location: [facility] INVASIVE CV LAB;  Service: Cardiovascular;  Laterality: N/A;   LUMBAR SPINE SURGERY  ~ 2001   Intradiscal  Electrothermoplasty   RIGHT/LEFT HEART CATH AND CORONARY ANGIOGRAPHY N/A 03/07/2021   Procedure: RIGHT/LEFT HEART CATH AND CORONARY ANGIOGRAPHY;  Surgeon: Laurey Morale, MD;  Location: [facility] INVASIVE CV LAB;  Service: Cardiovascular;  Laterality: N/A;  RIGHT/LEFT HEART CATH AND CORONARY ANGIOGRAPHY N/A 02/23/2023   Procedure: RIGHT/LEFT HEART CATH AND CORONARY ANGIOGRAPHY;  Surgeon: Dolores Patty, MD;  Location: [facility] INVASIVE CV LAB;  Service: Cardiovascular;  Laterality: N/A;   VASECTOMY  1995    Family History:  Family History  Problem Relation Age of Onset   Lung cancer Mother    Brain cancer Mother    Lymphoma Father    Heart disease Brother    Heart attack Brother    Diabetes Mellitus II Brother    Diabetes Mellitus II Paternal Grandfather     Social History: Social History   Socioeconomic History   Marital status: Married    Spouse name: Not on file   Number of children: Not on file   Years of education: Not on file   Highest education level: Not on file  Occupational History   Not on file  Tobacco Use   Smoking status: Never   Smokeless tobacco: Never  Vaping Use   Vaping status: Never Used  Substance and Sexual Activity   Alcohol use: No   Drug use: Never   Sexual activity: Not on file  Other Topics Concern   Not on file  Social History Narrative   Not on file   Social Determinants of Health   Financial Resource Strain: Low Risk  (04/20/2023)   Overall Financial Resource Strain (CARDIA)    Difficulty of Paying Living Expenses: Not hard at all  Food Insecurity: No Food Insecurity (04/08/2023)   Hunger Vital Sign    Worried About Running Out of Food in the Last Year: Never true    Ran Out of Food in the Last Year: Never true  Transportation Needs: No Transportation Needs (04/08/2023)   PRAPARE - Administrator, Civil Service (Medical): No    Lack of Transportation (Non-Medical): No  Physical Activity: Not on file  Stress: Not on file   Social Connections: Unknown (10/28/2021)   Received from [facility]   Social Network    Social Network: Not on file    Allergies:  No Known Allergies  Objective:    Vital Signs:   Temp:  [97.4 F (36.3 C)] 97.4 F (36.3 C) (11/11 0925) Pulse Rate:  [62-68] 62 (11/11 1200) Resp:  [15-18] 16 (11/11 1200) BP: (108-113)/(68-77) 110/68 (11/11 1200) SpO2:  [95 %-97 %] 96 % (11/11 1200) Weight:  [138.3 kg] 138.3 kg (11/11 0925)   Filed Weights   04/30/23 0925  Weight: (!) 138.3 kg     Physical Exam     General:  Well appearing. No respiratory difficulty HEENT: Normal Neck: Supple. no JVD. Carotids 2+ bilat; no bruits. No lymphadenopathy or thyromegaly appreciated. Cor: PMI nondisplaced. Regular rate & rhythm. No rubs, gallops or murmurs. Lungs: Clear Abdomen: Soft, nontender, nondistended. No hepatosplenomegaly. No bruits or masses. Good bowel sounds. Extremities: No cyanosis, clubbing, rash, edema Neuro: Alert & oriented x 3, cranial nerves grossly intact. moves all 4 extremities w/o difficulty. Affect pleasant.   Telemetry   SInus 60s Personally reviewed    Labs     Basic Metabolic Panel: Recent Labs  Lab 04/30/23 1136  NA 145  140  K 3.1*  3.8    Liver Function Tests: No results for input(s): "AST", "ALT", "ALKPHOS", "BILITOT", "PROT", "ALBUMIN" in the last 168 hours. No results for input(s): "LIPASE", "AMYLASE" in the last 168 hours. No results for input(s): "AMMONIA" in the last 168 hours.  CBC: Recent Labs  Lab  04/30/23 1136  HGB 11.9*  13.6  HCT 35.0*  40.0    Cardiac Enzymes: No results for input(s): "CKTOTAL", "CKMB", "CKMBINDEX", "TROPONINI" in the last 168 hours.  BNP: BNP (last 3 results) Recent Labs    03/09/23 1243 03/28/23 2205 04/08/23 1820  BNP 221.8* 178.3* 182.4*    ProBNP (last 3 results) Recent Labs    05/10/22 0905 08/10/22 1534  PROBNP 475* 351*     CBG: Recent Labs  Lab 04/30/23 0928  04/30/23 1158  GLUCAP 148* 122*    Coagulation Studies: No results for input(s): "LABPROT", "INR" in the last 72 hours.  Imaging: [place] EKG SITE RITE  Result Date: 04/30/2023 If Site Rite image not attached, placement could not be confirmed due to current cardiac rhythm.  CARDIAC CATHETERIZATION  Result Date: 04/30/2023 Findings: RA = 9 RV = 42/12 PA = 38/16 (28) PCW = 19 (v = 26) Fick cardiac output/index = 4.9/1.9 Thermo CO/CI = 5.7/2.2 PVR = 1.6 WU Ao sat = 94% PA sat = 57%, 62% PAPi = 2.4 Assessment: 1. Much improved hemodynamics Plan/Discussion: Will start low-dose milrinone for RV support in anticipation of VAD on Thursday. Arvilla Meres, MD 11:52 AM    Patient Profile    Assessment/Plan   1. VT Arrest  - recurrent episodes, 2nd admit in the last 2 wks - Admitted 10/10-10/13/24 w/ VF treated w/ ICD shock>>loaded w/ IV amio  - Re-admitted now w/ VT arrest, treated w/ appropriate ICD shock x 2 + brief bystander CPR  - in setting of underling CM w/ EF < 35% + hypokalemia (K 2.5) - Continue amiodarone 400 mg daily until EP follow up - Continue mexiletine 250 mg bid - No VT on device interrogation. - Check BMET and Mag today    2  Chronic Systolic HF - due to iCM - Echo (8/20): EF 20-25%  - Echo (9/22): 35-40% - Echo (6/23): EF 30-35% - Echo (8/24): EF 20-25%, RV ok. - R/LHC (9/24): stable 3v CAD, elevated biventricular filling pressures with low PAPi; CI 2.2 - He appears to have end-stage HF. With poor RV function, not a VAD candidate. BMI may prohibit transplant consideration. He has been referred to [facility] for transplant work up. Was seen by Dr. Edwena Blow on 04/02/23. Plan is to further discuss candidacy w/ transplant team. - NYHA IIIB-IV. Volume ok today on device interrogation and ReDs 31%. Soft BP limits GDMT today. - Continue torsemide 60 mg bid. - Avoid metolazone - can use Furoscix at home PRN (has home supply).  - Off Entresto with soft BP - Continue spiro 25 mg  daily  - Continue Jardiance 10 mg daily. - Labs at PCP on Monday showed K 4.4, requested labs today. - Dr. Gala Romney discussed with [facility]. Plan to work up for VAD here, with eye toward transplant down the road if he is able to get weight down. - Arrange RHC soon with Dr. Gala Romney to get updated hemodynamics. - Get ltd echo.   3. CAD with chronic stable angina - s/p multiple stents to LAD and LCX - Cath 9/22 stable CAD - LHC (9/24) with stable 3v CAD - No chest pain - Continue ASA. - Continue Ranexa 500 mg bid.  - On high-intensity statin. - Off Imdur with soft BP   4. H/o AKI  - SCr 1.2 - avoid hypotension. - Continue SGLT2i - Labs today   5. DM2 - A1c 7.2 (1/24) - Continue SGLT2i   6. OSA - Not using CPAP, says  machine was recalled a while ago - will need updated sleep study for new CPAP  - Refer back to Dr. Mayford Knife   7. Obesity - Body mass index is 37.27 kg/m. - Has struggled with weight loss - Continue Ozempic, may have better success with tirzepatide.   8. Frequent PVCs - On mexilitene 250 mg bid - Zio 5/22 PVC burden 5.4%.  - Followed by Dr. Elberta Fortis - Continue current management - Labs today.     FINCH, LINDSAY N, PA-C 04/30/2023, 12:21 PM  Advanced Heart Failure Team Pager [phone] (M-F; 7a - 5p)  Please contact [facility] for night-coverage after hours (4p -7a ) and weekends on [email or web address]  Patient seen and examined with the above-signed Advanced Practice Provider and/or Housestaff. I personally reviewed laboratory data, imaging studies and relevant notes. I independently examined the patient and formulated the important aspects of the plan. I have edited the note to reflect any of my changes or salient points. I have personally discussed the plan with the patient and/or family.  52 y/o male with severe systolic HF due to severe ischemic CM. Undergoing w/u for advanced therapies. Seen at [facility]. Not candidate for transplant currently due to size. Plan  for VAD on Tuesday.   RHC today improved but RV still marginal  General:  Well appearing. No resp difficulty HEENT: normal Neck: supple. no JVD. Carotids 2+ bilat; no bruits. No lymphadenopathy or thryomegaly appreciated. Cor: PMI nondisplaced. Regular rate & rhythm. No rubs, gallops or murmurs. Lungs: clear Abdomen: obese soft, nontender, nondistended. No hepatosplenomegaly. No bruits or masses. Good bowel sounds. Extremities: no cyanosis, clubbing, rash, edema Neuro: alert & orientedx3, cranial nerves grossly intact. moves all 4 extremities w/o difficulty. Affect pleasant  Will admit for pre-VAD optimization. Will start milrinone. Place PICC.  D/w Dr. Donata Clay.  Arvilla Meres, MD  4:28 PM

## 2023-04-30 NOTE — Progress Notes (Addendum)
Peripherally Inserted Central Catheter Placement  The IV Nurse has discussed with the patient and/or persons authorized to consent for the patient, the purpose of this procedure and the potential benefits and risks involved with this procedure.  The benefits include less needle sticks, lab draws from the catheter, and the patient may be discharged home with the catheter. Risks include, but not limited to, infection, bleeding, blood clot (thrombus formation), and puncture of an artery; nerve damage and irregular heartbeat and possibility to perform a PICC exchange if needed/ordered by physician.  Alternatives to this procedure were also discussed.  Bard Power PICC patient education guide, fact sheet on infection prevention and patient information card has been provided to patient /or left at bedside.    PICC Placement Documentation  PICC Triple Lumen 04/30/23 Right Basilic 45 cm 0 cm (Active)  Indication for Insertion or Continuance of Line Chronic illness with exacerbations (CF, Sickle Cell, etc.) 04/30/23 1720  Exposed Catheter (cm) 0 cm 04/30/23 1720  Site Assessment Clean, Dry, Intact 04/30/23 1720  Lumen #1 Status Flushed;Saline locked;Blood return noted 04/30/23 1720  Lumen #2 Status Flushed;Saline locked;Blood return noted 04/30/23 1720  Lumen #3 Status Flushed;Saline locked;Blood return noted 04/30/23 1720  Dressing Type Transparent;Securing device 04/30/23 1720  Dressing Status Antimicrobial disc in place;Clean, Dry, Intact 04/30/23 1720  Line Care Connections checked and tightened 04/30/23 1720  Dressing Intervention New dressing 04/30/23 1720  Dressing Change Due 05/07/23 04/30/23 1720    Printed ECG verification placed into chart.    Curt Jews 04/30/2023, 5:25 PM

## 2023-04-30 NOTE — Consult Note (Addendum)
Palliative Medicine Inpatient Consult Note  Consulting Provider:  Dr. Gala Romney  Reason for consult:  VAD evaluation  04/30/2023  HPI:  Per intake H&P --> ichard Renato Pennick is a 52 y.o. male with medical history significant for chronic systolic heart failure in setting of ischemic cardiomyopathy, V-fib arrest, hyperlipidemia, obstructive sleep apnea on home nocturnal CPAP, type 2 diabetes mellitus. The Palliative medicine team has been asked to assist in VAD evaluation.   Clinical Assessment/Goals of Care:  *Please note that this is a verbal dictation therefore any spelling or grammatical errors are due to the "Dragon Medical One" system interpretation.  I have reviewed medical records including EPIC notes, labs and imaging, received report from bedside RN, assessed the patient who is sitting on the edge of his bed eating a salad.    I met with Gerlene Burdock and his wife, Pincus Large to further discuss diagnosis prognosis, GOC, EOL wishes, disposition and options.   I introduced Palliative Medicine as specialized medical care for people living with serious illness. It focuses on providing relief from the symptoms and stress of a serious illness. The goal is to improve quality of life for both the patient and the family.  Medical History Review and Understanding:  A review of Koleton's past medical history inclusive of his class IV heart failure, V-fib arrest x 2, hyperlipidemia, obstructive sleep apnea, type 2 diabetes, coronary artery disease status post 5 stents, and prior myocardial infarction was complete.  Social History:  Reinhold shares with me that he is from [facility].  He has lived in [place]   throughout the majority of his life.  Timber and his wife Pincus Large have been married for the past 26 years.  Nakul has 2 sets of twins from his previous marriage and Pincus Large has 2 children from her previous marriage.  They have 8 grandchildren.  He shares that he formally  worked as an Horticulturist, commercial for 2 divisions.  He was constantly kept busy with environmental cleanup such as truck rollovers, decomposing bodies in their homes (which he had to clean up what was left behind) after they were taken to the morgue, and during COVID the amount of decontamination's that he had to do created tremendous stress.  Nasire has been disabled for the past 2 years.  Clintin is a man of faith and was Baptized yesterday.  Functional and Nutritional State:  Preceding hospitalization Rudell was able to mobilize and perform B ADLs though can get fatigued and short of breath upon exertion.  He has had a good appetite.  He shares that he has been actively trying to lose weight and to date has lost over 85 pounds.  He is in the process of trying to lose 25 more pounds.  Advance Directives:  A detailed discussion was had today regarding advanced directives.  Patient does have a living will which is in [place].  Code Status:  Concepts specific to code status, artifical feeding and hydration, continued IV antibiotics and rehospitalization was had.  The difference between a aggressive medical intervention path  and a palliative comfort care path for this patient at this time was had.   Patient shares that he is full code/full scope of care.  He shares that he has had 2 V-fib arrest for which his AICD was able to shock him back into rhythm.  He endorses these were scary events. He shares the first time he blacked out therefore did not feel the shock though the second time he did and  it was striking.  Discussion:  We discussed expectations when undergoing VAD.  We reviewed that Seleem is hopeful once he loses 25 more pounds he would be a transplant candidate.  Marcellius shares he has been worked up formally at [facility] and he has been told if he can lose some more weight he will be a good candidate.  Daylyn and his wife are very hopeful for improvement after VAD placement.  Larrie's wife  does ask questions about an LVAD and if it is considered "a life support". If so would his living will be inhibitory in any way.  I shared that this is not the case and the Living Will is preserved for patients when they are in situations which are considered incurable/irreversible with a very poor prognosis.  Ladarius has a home environment which is well-suited for anyone with debility.  Both Jigar and his wife specifically designed their home in the event that his sister should become ill as she has severe ALS and they have a very close relationship.  Payten had an excellent family support with his wife, his children, and his neighbor community.  Caidon does not have any habits such as drinking or smoking which would cause red flags to inhibit VAD initiation.   Binyamin is an appropriate candidate for VAD placement as the device insertion will improve his quality of life.  Discussed the importance of continued conversation with family and their  medical providers regarding overall plan of care and treatment options, ensuring decisions are within the context of the patients values and GOCs.  Decision Maker: Patient can make decisions for himself though if incapacitated for any reason he would rely on his wife Lenore Campoverde  SUMMARY OF RECOMMENDATIONS   Full Code / Full Scope of Care  Oshua has a living will, active family support, and a safe living situation --> appears to be an appropriate candidate for VAD  Stiles is hopeful with additional weight loss he will be considered for heart transplant --> Has been worked up at [facility] for VAD on Thursday  Appreciate Chaplain support  PMT will continue to follow along incrementally  Code Status/Advance Care Planning: FULL CODE   Palliative Prophylaxis:  Aspiration, Bowel Regimen, Delirium Protocol, Frequent Pain Assessment, Oral Care, Palliative Wound Care, and Turn Reposition  Additional Recommendations (Limitations, Scope,  Preferences): Continue current care  Psycho-social/Spiritual:  Desire for further Chaplaincy support: Patient just got Baptized Additional Recommendations: Education on VAD transplant   Prognosis: Could have a long and fruitful life with VAD and eventually heart transplant.  Discharge Planning: Discharge per primary team.  Vitals:   04/30/23 1345 04/30/23 1357  BP: 108/65 118/75  Pulse: 60   Resp: 19   Temp:  97.6 F (36.4 C)  SpO2: 96%     Intake/Output Summary (Last 24 hours) at 04/30/2023 1518 Last data filed at 04/30/2023 1502 Gross per 24 hour  Intake 19 ml  Output --  Net 19 ml   Last Weight  Most recent update: 04/30/2023  2:36 PM    Weight  139.4 kg (307 lb 5.1 oz)              Gen:  Middle aged Caucasian M in NAD HEENT: moist mucous membranes CV: Regular rate and rhythm  PULM: On RA, breathing is even and nonlabored ABD: soft/nontender  EXT: No edema  Neuro: Alert and oriented x3   PPS: 60%    Billing based on MDM: High  Problems Addressed: One acute  or chronic illness or injury that poses a threat to life or bodily function  Amount and/or Complexity of Data: Category 3:Discussion of management or test interpretation with external physician/other qualified health care professional/appropriate source (not separately reported)  Risks: Decision regarding elective major surgery with identified patient or procedure risk factors ______________________________________________________ Lamarr Lulas Ernest Palliative Medicine Team Team Cell Phone: [phone] Please utilize secure chat with additional questions, if there is no response within 30 minutes please call the above phone number  Palliative Medicine Team providers are available by phone from 7am to 7pm daily and can be reached through the team cell phone.  Should this patient require assistance outside of these hours, please call the patient's attending physician.

## 2023-04-30 NOTE — TOC Initial Note (Signed)
Transition of Care [facility]) - Initial/Assessment Note    Patient Details  Name: Joe [name] MRN: [id number] Date of Birth: 08-23-1970  Transition of [facility]) CM/SW Contact:    Nicanor Bake Phone Number: [phone] 04/30/2023, 3:55 PM  Clinical Narrative:    HF CSW met with pt and wife at bedside. Pt and wife stated that they live together. Pt stated that he has no history of HH services. Pt stated that he has equipment such as a hospital bed and handicap accessible home.  Pt stated that he has a scale. Pt stated that he last seen his PCP on October 30th. CSW explained that a hospital follow up appointment will be scheduled closer to dc. Pt agrees. Pt stated that he was feeling a little anxious about LVAD surgery Thursday, but excited.   TOC will continue following.           Expected Discharge Plan: Home/Self Care Barriers to Discharge: Continued Medical Work up   Patient Goals and CMS Choice            Expected Discharge Plan and Services       Living arrangements for the past 2 months: Single Family Home                                      Prior Living Arrangements/Services Living arrangements for the past 2 months: Single Family Home Lives with:: Spouse Patient language and need for interpreter reviewed:: Yes Do you feel safe going back to the place where you live?: Yes      Need for Family Participation in Patient Care: No (Comment) Care giver support system in place?: Yes (comment)   Criminal Activity/Legal Involvement Pertinent to Current Situation/Hospitalization: No - Comment as needed  Activities of Daily Living   ADL Screening (condition at time of admission) Independently performs ADLs?: Yes (appropriate for developmental age) Is the patient deaf or have difficulty hearing?: No Does the patient have difficulty seeing, even when wearing glasses/contacts?: No Does the patient have difficulty concentrating, remembering, or  making decisions?: No  Permission Sought/Granted                  Emotional Assessment Appearance:: Appears stated age Attitude/Demeanor/Rapport: Engaged Affect (typically observed): Appropriate Orientation: : Oriented to Self, Oriented to Place, Oriented to  Time, Oriented to Situation Alcohol / Substance Use: Not Applicable Psych Involvement: No (comment)  Admission diagnosis:  Acute on chronic systolic (congestive) heart failure (HCC) [I50.23] Acute on chronic systolic congestive heart failure (HCC) [I50.23] Patient Active Problem List   Diagnosis Date Noted   Acute on chronic systolic (congestive) heart failure (HCC) 04/30/2023   Acute on chronic systolic congestive heart failure (HCC) 04/30/2023   HFrEF (heart failure with reduced ejection fraction) (HCC) 04/08/2023   Ventricular fibrillation (HCC) 04/08/2023   AKI (acute kidney injury) (HCC) 04/08/2023   Cardiac arrest (HCC) 04/02/2023   VT (ventricular tachycardia) (HCC) 04/01/2023   Hypokalemia 03/29/2023   Prolonged QT interval 03/29/2023   Syncope 03/28/2023   Unstable angina (HCC) 11/27/2021   Palpitations 03/04/2021   Chest pain 03/04/2021   Body mass index 45.0-49.9, adult (HCC)    CHF (congestive heart failure) (HCC)    Fatigue    Hyperlipidemia    History of gout    Muscular chest pain    OSA on CPAP    Plantar fasciitis    DM2 (  diabetes mellitus, type 2) (HCC)    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/02/2019   Chronic systolic heart failure (HCC) 03/19/2019   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   Coronary artery disease    Preoperative examination 02/11/2018   CAD S/P percutaneous coronary angioplasty 09/07/2017   Ischemic cardiomyopathy 09/07/2017   Acute systolic heart failure (HCC) 09/06/2017   Angina pectoris (HCC) 09/05/2017   Shortness of breath 09/05/2017   Type 2 diabetes mellitus with complication, without long-term current use of insulin (HCC)  09/05/2017   Carpal tunnel syndrome of right wrist 08/27/2015   Left hand pain 08/16/2015   Carpal tunnel syndrome of left wrist 08/16/2015   Obstructive sleep apnea    GERD (gastroesophageal reflux disease)    Erectile dysfunction    Essential hypertension, benign    Obesity    Testosterone deficiency    Gout    Abnormal EKG    Right bundle branch block    Complication of anesthesia 1995   PCP:  Lonie Peak, PA-C Pharmacy:   [phone] [address], [place] - [address] AT [facility] [address] [place] [address] Phone: [phone] Fax: [phone]  [facility] [address] [place] [address] Phone: [phone] Fax: [phone]     Social Determinants of Health (SDOH) Social History: SDOH Screenings   Food Insecurity: No Food Insecurity (04/30/2023)  Housing: Low Risk  (04/30/2023)  Transportation Needs: No Transportation Needs (04/30/2023)  Utilities: Not At Risk (04/30/2023)  Financial Resource Strain: Low Risk  (04/20/2023)  Social Connections: Unknown (10/28/2021)   Received from [facility]  Tobacco Use: Low Risk  (04/30/2023)   SDOH Interventions:     Readmission Risk Interventions     No data to display

## 2023-04-30 NOTE — Progress Notes (Signed)
Heart Failure Navigator Progress Note  Assessed for Heart & Vascular TOC clinic readiness.  Patient does not meet criteria due to Advanced Heart Failure Team patient of Dr. Gala Romney. Pre-Vad? .   Navigator will sign off at this time.   Rhae Hammock, BSN, Scientist, clinical (histocompatibility and immunogenetics) Only

## 2023-04-30 NOTE — Progress Notes (Signed)
PT Cancellation Note  Patient Details Name: Joe [name] MRN: [id number] DOB: 11/07/1970   Cancelled Treatment:    Reason Eval/Treat Not Completed: Patient at procedure or test/unavailable  Patient with Palliative Care Team. Will reattempt as schedule permits.    Jerolyn Center, PT Acute Rehabilitation Services  Office [phone]  Zena Amos 04/30/2023, 3:00 PM

## 2023-05-01 ENCOUNTER — Encounter (HOSPITAL_COMMUNITY): Payer: Self-pay | Admitting: Internal Medicine

## 2023-05-01 ENCOUNTER — Inpatient Hospital Stay (HOSPITAL_COMMUNITY): Payer: 59

## 2023-05-01 DIAGNOSIS — I5022 Chronic systolic (congestive) heart failure: Secondary | ICD-10-CM | POA: Diagnosis not present

## 2023-05-01 DIAGNOSIS — I5023 Acute on chronic systolic (congestive) heart failure: Secondary | ICD-10-CM

## 2023-05-01 DIAGNOSIS — Z515 Encounter for palliative care: Secondary | ICD-10-CM | POA: Diagnosis not present

## 2023-05-01 LAB — COOXEMETRY PANEL
Carboxyhemoglobin: 2.4 % — ABNORMAL HIGH (ref 0.5–1.5)
Methemoglobin: 1.5 % (ref 0.0–1.5)
O2 Saturation: 66.8 %
Total hemoglobin: 14.1 g/dL (ref 12.0–16.0)

## 2023-05-01 LAB — CBC
HCT: 42.2 % (ref 39.0–52.0)
Hemoglobin: 14 g/dL (ref 13.0–17.0)
MCH: 31.6 pg (ref 26.0–34.0)
MCHC: 33.2 g/dL (ref 30.0–36.0)
MCV: 95.3 fL (ref 80.0–100.0)
Platelets: 167 10*3/uL (ref 150–400)
RBC: 4.43 MIL/uL (ref 4.22–5.81)
RDW: 14.5 % (ref 11.5–15.5)
WBC: 5.8 10*3/uL (ref 4.0–10.5)
nRBC: 0 % (ref 0.0–0.2)

## 2023-05-01 LAB — ECHOCARDIOGRAM LIMITED
Area-P 1/2: 5.66 cm2
Height: 75 in
S' Lateral: 7.2 cm
Weight: 4878.4 [oz_av]

## 2023-05-01 LAB — BASIC METABOLIC PANEL
Anion gap: 9 (ref 5–15)
BUN: 20 mg/dL (ref 6–20)
CO2: 28 mmol/L (ref 22–32)
Calcium: 8.7 mg/dL — ABNORMAL LOW (ref 8.9–10.3)
Chloride: 99 mmol/L (ref 98–111)
Creatinine, Ser: 1.5 mg/dL — ABNORMAL HIGH (ref 0.61–1.24)
GFR, Estimated: 56 mL/min — ABNORMAL LOW (ref >60)
Glucose, Bld: 157 mg/dL — ABNORMAL HIGH (ref 70–99)
Potassium: 4.1 mmol/L (ref 3.5–5.1)
Sodium: 136 mmol/L (ref 135–145)

## 2023-05-01 LAB — MAGNESIUM: Magnesium: 2.4 mg/dL (ref 1.7–2.4)

## 2023-05-01 MED ORDER — ENOXAPARIN SODIUM 60 MG/0.6ML IJ SOSY
60.0000 mg | PREFILLED_SYRINGE | INTRAMUSCULAR | Status: DC
  Administered 2023-05-02: 60 mg via SUBCUTANEOUS
  Filled 2023-05-01: qty 0.6

## 2023-05-01 MED ORDER — PERFLUTREN LIPID MICROSPHERE
1.0000 mL | INTRAVENOUS | Status: AC | PRN
  Administered 2023-05-01: 3 mL via INTRAVENOUS

## 2023-05-01 MED ORDER — EZETIMIBE 10 MG PO TABS
10.0000 mg | ORAL_TABLET | Freq: Every day | ORAL | Status: DC
  Administered 2023-05-01 – 2023-05-02 (×2): 10 mg via ORAL
  Filled 2023-05-01 (×2): qty 1

## 2023-05-01 NOTE — Progress Notes (Signed)
Initial Nutrition Assessment  DOCUMENTATION CODES:   Not applicable  INTERVENTION:   - Ensure Enlive po BID, each supplement provides 350 kcal and 20 grams of protein  - Diet education provided  NUTRITION DIAGNOSIS:   Increased nutrient needs related to chronic illness as evidenced by estimated needs.  GOAL:   Patient will meet greater than or equal to 90% of their needs  MONITOR:   PO intake, Supplement acceptance, Labs, Weight trends, I & O's  REASON FOR ASSESSMENT:   Consult LVAD Eval  ASSESSMENT:   52 year old male who was admitted on 11/11 for surgical optimization for LVAD HM3 implant. PMH of T2DM, HTN, HLD, OSA, CAD, systolic HF (EF 20-25%).  Noted plan for VAD on tomorrow.  Spoke with pt at bedside. Pt reports intentional weight loss over the last 1.5 years. Pt reports that his highest weight was 385 lbs but that he weighed about 350 lbs when he really started trying to lose weight in the middle of last year. Pt reports overall weight loss of 86 lbs. Pt reports a dry weight of around 301 lbs.  Pt reports being on multiple diet plans with his wife over the years. He also reports taking Ozempic and that the plan was to increase his dose soon since his weight had reached a plateau. Pt reports in addition to the Ozempic, he has been watching his portion sizes and eating about half of what he used to eat. He also reports that he has been listening to his fullness cues.  Pt reports usually eating smaller, more frequent meals. He states that breakfast is usually a Chartered certified accountant or eggs and toast. Lunch is more like a snack. Dinner is chicken or fish with sides.  Discussed increased nutrient needs post-op. Pt states understanding. He is willing to consume oral nutrition supplements to aid in meeting increased needs. RD to order. Pt with T2DM but CBG's not currently being checked. Due to increased nutrient needs, Ensure is an appropriate supplement choice at this  time.  Admit weight: 138.3 kg Current weight: 136.5 kg  Meal Completion: 100% x 3 documented meals  Medications reviewed and include: magnesium oxide 400 mg daily, protonix, klor-con 40 mEq TID, spironolactone, torsemide, milrinone gtt  Labs reviewed: sodium 134, BUN 22, creatinine 1.49  UOP: 4000 ml x 24 hours I/O's: -4.6 L since admit  NUTRITION - FOCUSED PHYSICAL EXAM:  Flowsheet Row Most Recent Value  Orbital Region No depletion  Upper Arm Region No depletion  Thoracic and Lumbar Region No depletion  Buccal Region No depletion  Temple Region No depletion  Clavicle Bone Region No depletion  Clavicle and Acromion Bone Region No depletion  Scapular Bone Region No depletion  Dorsal Hand No depletion  Patellar Region No depletion  Anterior Thigh Region No depletion  Posterior Calf Region No depletion  Edema (RD Assessment) Mild  [BLE]  Hair Reviewed  Eyes Reviewed  Mouth Reviewed  Skin Reviewed  Nails Reviewed       Diet Order:   Diet Order             Diet Heart Room service appropriate? Yes; Fluid consistency: Thin  Diet effective now                   EDUCATION NEEDS:   Education needs have been addressed  Skin:  Skin Assessment: Reviewed RN Assessment  Last BM:  04/29/23  Height:   Ht Readings from Last 1 Encounters:  04/30/23 6\' 3"  (1.905 m)  Weight:   Wt Readings from Last 1 Encounters:  05/02/23 (!) 136.5 kg    Ideal Body Weight:  89.1 kg  BMI:  Body mass index is 37.62 kg/m.  Estimated Nutritional Needs:   Kcal:  2400-2600  Protein:  120-140 grams  Fluid:  2.0 L    Mertie Clause, MS, RD, LDN Registered Dietitian II Please see AMiON for contact information.

## 2023-05-01 NOTE — Progress Notes (Signed)
This chaplain responded to PMT NP-Michelle referral for spiritual care in the setting of building rapport with the Pt.   The chaplain introduced herself to the Pt. and his wife-Lenore. The chaplain listened reflectively as the Pt. expressed his trust in the medical team to move forward with the LVAD. The chaplain provided education on chaplain presence and F/U spiritual care as needed.  Chaplain Stephanie Acre [phone]

## 2023-05-01 NOTE — Progress Notes (Signed)
Echocardiogram 2D Echocardiogram has been performed.  Joe [name] 05/01/2023, 9:12 AM

## 2023-05-01 NOTE — Evaluation (Signed)
Physical Therapy Brief Evaluation and Discharge Note Patient Details Name: Joe [name] MRN: [id number] DOB: 05/20/1971 Today's Date: 05/01/2023   History of Present Illness  Pt admitted 04/30/23 in prep for LVAD placement planned for Thursday 11/14.  PMH: morbid obesity, DM2, HTN, HL, OSA, CAD and systolic HF (EF 20-25%)   Clinical Impression  PT to see patient for 6 minute walk test prior to LVAD placement on Thursday 11/14. Pt ambulated 746' in 6 minutes without AD or physical assist. Pt with mild SOB but steady, consistent pace. No episodes of LOB. Acute PT to complete re-eval s/p LVAD placement planned for Thursday 11/14 as appropriate per MD orders.       PT Assessment  (PT to re-assess post LVAD placement as appropriate)  Assistance Needed at Discharge       Equipment Recommendations  (TBD)  Recommendations for Other Services       Precautions/Restrictions Precautions Precautions: None Restrictions Weight Bearing Restrictions: No        Mobility  Bed Mobility       General bed mobility comments: pt received sitting EOB  Transfers Overall transfer level: Independent Equipment used: None               General transfer comment: no difficulty    Ambulation/Gait Ambulation/Gait assistance: Modified independent (Device/Increase time) Gait Distance (Feet): 746 Feet Assistive device: None (started with IV pole but then PT pushed it) Gait Pattern/deviations: WFL(Within Functional Limits) Gait Speed: Below normal General Gait Details: decreased pace due to onset of DOE but no instability or need to stop and rest  Home Activity Instructions    Stairs            Modified Rankin (Stroke Patients Only)        Balance Overall balance assessment: No apparent balance deficits (not formally assessed)                        Pertinent Vitals/Pain PT - Brief Vital Signs All Vital Signs Stable: Yes Pain Assessment Pain Assessment:  No/denies pain     Home Living Family/patient expects to be discharged to:: Private residence Living Arrangements: Spouse/significant other Available Help at Discharge: Family;Available 24 hours/day Home Environment: Ramped entrance (fully handicapped accessible ome)   Home Equipment: Shower seat;Hospital bed   Additional Comments: recently built a fully handicapped accessible home    Prior Function Level of Independence: Independent Comments: on disability for work    UE/LE Assessment   UE ROM/Strength/Tone/Coordination: [employer]    LE ROM/Strength/Tone/Coordination: [employer]      Communication   Communication Communication: No apparent difficulties     Cognition Overall Cognitive Status: Appears within functional limits for tasks assessed/performed       General Comments General comments (skin integrity, edema, etc.): VSS, fluid overloaded    Exercises     Assessment/Plan    PT Problem List         PT Visit Diagnosis Other (comment)    No Skilled PT     Co-evaluation                AMPAC 6 Clicks Help needed turning from your back to your side while in a flat bed without using bedrails?: None Help needed moving from lying on your back to sitting on the side of a flat bed without using bedrails?: None Help needed moving to and from a bed to a chair (including a wheelchair)?: None Help needed standing up from  a chair using your arms (e.g., wheelchair or bedside chair)?: None Help needed to walk in hospital room?: None Help needed climbing 3-5 steps with a railing? : A Little 6 Click Score: 23      End of Session   Activity Tolerance: Patient tolerated treatment well Patient left: in bed;with call bell/phone within reach (sitting EOB) Nurse Communication: Mobility status PT Visit Diagnosis: Other (comment)     Time: 7062-3762 PT Time Calculation (min) (ACUTE ONLY): 17 min  Charges:   PT Evaluation $PT Eval Low Complexity: 1 Low      [name], PT, DPT Acute Rehabilitation Services Secure chat preferred Office #: [phone]   [name]  05/01/2023, 2:08 PM

## 2023-05-01 NOTE — Plan of Care (Signed)
  Problem: Activity: Goal: Ability to return to baseline activity level will improve Outcome: Progressing   Problem: Cardiovascular: Goal: Ability to achieve and maintain adequate cardiovascular perfusion will improve Outcome: Progressing Goal: Vascular access site(s) Level 0-1 will be maintained Outcome: Progressing   Problem: Education: Goal: Knowledge of General Education information will improve Description: Including pain rating scale, medication(s)/side effects and non-pharmacologic comfort measures Outcome: Progressing   Problem: Clinical Measurements: Goal: Will remain free from infection Outcome: Progressing Goal: Cardiovascular complication will be avoided Outcome: Progressing   Problem: Pain Management: Goal: General experience of comfort will improve Outcome: Progressing   Problem: Skin Integrity: Goal: Risk for impaired skin integrity will decrease Outcome: Progressing

## 2023-05-01 NOTE — Progress Notes (Signed)
Met with pt and wife Dell Ponto to discuss VAD implant and hospital course. Pt and wife ask really good questions. We discussed his driveline dressing changes for post VAD. The wife has already bought the pt LVAD conceal shirts and an LVAD sleep belt.   Pre - Intermacs follow up completed including:  Quality of Life, KCCQ-12, and Neurocognitive trail making.   Pt completed 746 feet during 6 minute walk.  Carlton Adam RN, BSN VAD Coordinator 24/7 Pager [phone]

## 2023-05-01 NOTE — Progress Notes (Addendum)
Advanced Heart Failure Rounding Note  PCP-Cardiologist: [name], MD   Subjective:    Admit for surgical optimization. HM3 LVAD implantation 11/14 with Dr. Maren Beach.  Milrinone 0.125 mcg/kg/min + 80 IV Lasix bid  Coox 66.8%. Weight down from yesterday. Start CVP monitoring and continue to diurese.  Fees well this morning. No SOB, No CP. Good appetite. Encouraged to ambulate and excercise as able.  Objective:   Weight Range: (!) 138.3 kg Body mass index is 38.11 kg/m.   Vital Signs:   Temp:  [97.4 F (36.3 C)-97.8 F (36.6 C)] 97.7 F (36.5 C) (11/12 0345) Pulse Rate:  [60-68] 62 (11/12 0345) Resp:  [11-22] 20 (11/12 0345) BP: (93-118)/(53-77) 107/65 (11/12 0345) SpO2:  [94 %-97 %] 95 % (11/12 0345) Weight:  [138.3 kg-139.4 kg] 138.3 kg (11/12 0345) Last BM Date : 04/29/23  Weight change: Filed Weights   04/30/23 0925 04/30/23 1357 05/01/23 0345  Weight: (!) 138.3 kg (!) 139.4 kg (!) 138.3 kg    Intake/Output:   Intake/Output Summary (Last 24 hours) at 05/01/2023 0806 Last data filed at 04/30/2023 2000 Gross per 24 hour  Intake 259 ml  Output 1400 ml  Net -1141 ml    Physical Exam    CVP pending General: Well appearing. No distress on RA HEENT: neck thick.   Cardiac: JVP difficult to assess. S1 and S2 present. No murmurs or rub. Resp: Lung sounds clear and equal B/L Abdomen: Soft, non-tender, non-distended. + BS. Extremities: Warm and dry. No rash, cyanosis, or edema. RUE PICC Neuro: Alert and oriented x3. Affect pleasant. Moves all extremities without difficulty.  Telemetry   V-paced 70-80s (Personally reviewed)  EKG    N/a  Labs    CBC Recent Labs    04/30/23 1136  HGB 11.9*  13.6  HCT 35.0*  40.0   Basic Metabolic Panel Recent Labs    63/87/56 1136 05/01/23 0340  NA 145  140 136  K 3.1*  3.8 4.1  CL  --  99  CO2  --  28  GLUCOSE  --  157*  BUN  --  20  CREATININE  --  1.50*  CALCIUM  --  8.7*  MG  --  2.4    Liver Function Tests No results for input(s): "AST", "ALT", "ALKPHOS", "BILITOT", "PROT", "ALBUMIN" in the last 72 hours. No results for input(s): "LIPASE", "AMYLASE" in the last 72 hours. Cardiac Enzymes No results for input(s): "CKTOTAL", "CKMB", "CKMBINDEX", "TROPONINI" in the last 72 hours.  BNP: BNP (last 3 results) Recent Labs    03/09/23 1243 03/28/23 2205 04/08/23 1820  BNP 221.8* 178.3* 182.4*    ProBNP (last 3 results) Recent Labs    05/10/22 0905 08/10/22 1534  PROBNP 475* 351*     D-Dimer No results for input(s): "DDIMER" in the last 72 hours. Hemoglobin A1C No results for input(s): "HGBA1C" in the last 72 hours. Fasting Lipid Panel No results for input(s): "CHOL", "HDL", "LDLCALC", "TRIG", "CHOLHDL", "LDLDIRECT" in the last 72 hours. Thyroid Function Tests No results for input(s): "TSH", "T4TOTAL", "T3FREE", "THYROIDAB" in the last 72 hours.  Invalid input(s): "FREET3"  Other results:  Imaging   [place] EKG SITE RITE  Result Date: 04/30/2023 If Site Rite image not attached, placement could not be confirmed due to current cardiac rhythm.  CARDIAC CATHETERIZATION  Result Date: 04/30/2023 Findings: RA = 9 RV = 42/12 PA = 38/16 (28) PCW = 19 (v = 26) Fick cardiac output/index = 4.9/1.9 Thermo CO/CI =  5.7/2.2 PVR = 1.6 WU Ao sat = 94% PA sat = 57%, 62% PAPi = 2.4 Assessment: 1. Much improved hemodynamics Plan/Discussion: Will start low-dose milrinone for RV support in anticipation of VAD on Thursday. Arvilla Meres, MD 11:52 AM   Medications:    Scheduled Medications:  allopurinol  100 mg Oral Daily   amiodarone  200 mg Oral BID   aspirin EC  81 mg Oral Daily   Chlorhexidine Gluconate Cloth  6 each Topical Daily   enoxaparin (LOVENOX) injection  40 mg Subcutaneous Q24H   escitalopram  10 mg Oral QHS   gabapentin  300 mg Oral TID   levothyroxine  25 mcg Oral Daily   magnesium oxide  400 mg Oral Daily   mexiletine  250 mg Oral BID   pantoprazole   40 mg Oral Daily   potassium chloride SA  40 mEq Oral TID   ranolazine  500 mg Oral BID   rosuvastatin  40 mg Oral Daily   sodium chloride flush  10-40 mL Intracatheter Q12H   sodium chloride flush  3 mL Intravenous Q12H   sodium chloride flush  3 mL Intravenous Q12H   spironolactone  25 mg Oral Daily   torsemide  60 mg Oral BID    Infusions:  sodium chloride     sodium chloride     milrinone 0.125 mcg/kg/min (05/01/23 0333)    PRN Medications: sodium chloride, sodium chloride, acetaminophen, mouth rinse, polyvinyl alcohol, sodium chloride flush, sodium chloride flush, sodium chloride flush  Patient Profile   Joe [name] is a 52 y.o. male with h/o morbid obesity, DM2, HTN, HL, OSA, CAD and systolic HF (EF 20-25%) referred by Dr. Bing Matter for further management of his HF   Assessment/Plan   1.  Chronic Systolic HF. Admit for surgical optimization for LVAD HM3 implant 11/14 w Dr. Donata Clay. - due to iCM - Echo (8/20): EF 20-25%  - Echo (9/22): 35-40% - Echo (6/23): EF 30-35% - Echo (8/24): EF 20-25%, RV ok. - R/LHC (9/24): stable 3v CAD, elevated biventricular filling pressures with low PAPi; CI 2.2 - NYHA IIIB-IV. He appears to have end-stage HF. Not a transplant candidate per [facility].  - RV function normal on Echo 02/15/23. Repeat Echo pending. - Milrinone 0.125 mcg/kg/min for optimization. Coox 66.8. Start monitoring CVP today pending set up. - Continue torsemide 60 mg bid. - Off Entresto with soft BP - Continue spiro 25 mg daily  - Continue Jardiance 10 mg daily. - Now admitted for surgical optimization for VAD implantation 11/14 with Dr. Donata Clay.VAD candidate.  2. H/o VT Arrest  - recurrent episodes - Admitted 10/10-10/13/24 w/ VF treated w/ ICD shock>>loaded w/ IV amio  - Re-admitted now w/ VT arrest, treated w/ appropriate ICD shock x 2 + brief bystander CPR  - in setting of underling CM w/ EF < 35% + intermittent hypokalemia  - Continue amiodarone 400 mg bid -  Continue mexiletine 250 mg bid  3. CAD with chronic stable angina - s/p multiple stents to LAD and LCX - Cath 9/22 stable CAD - LHC (9/24) with stable 3v CAD - Continue ASA. - Continue Ranexa 500 mg bid.  - Continue Crestor 40mg  daily - Off Imdur with soft BP   4. AKI  - SCr 1.5 (baseline 1.1) - avoid hypotension. - Continue SGLT2i - Diuresis as above - Follow BMET   5. DM2 - A1c 7.2 (1/24) - Continue SGLT2i   6. OSA - Not using CPAP, says machine  was recalled a while ago. Needs new machine. - Followed outpatient   7. Obesity - Body mass index is 38.11 kg/m. - Has struggled with weight loss - Continue Ozempic, may have better success with tirzepatide.   8. Frequent PVCs - On mexilitene 250 mg bid - Zio 5/22 PVC burden 5.4%.  - Followed by Dr. Elberta Fortis - None noted on tele  Length of Stay: 1  [place] Lee, NP  05/01/2023, 8:06 AM  Advanced Heart Failure Team Pager [phone] (M-F; 7a - 5p)  Please contact [facility] for night-coverage after hours (5p -7a ) and weekends on [email or web address]  Patient seen and examined with the above-signed Advanced Practice Provider and/or Housestaff. I personally reviewed laboratory data, imaging studies and relevant notes. I independently examined the patient and formulated the important aspects of the plan. I have edited the note to reflect any of my changes or salient points. I have personally discussed the plan with the patient and/or family.  Remains on milrinone. Co-ox 67% Feels good. Denies CP or SOB. Rhythm stable.   Echo today reviewed personally. EF 20% RV moderately down (improved).   General:  Sitting up. No resp difficulty HEENT: normal Neck: supple. no JVD. Carotids 2+ bilat; no bruits. No lymphadenopathy or thryomegaly appreciated. Cor: PMI nondisplaced. Regular rate & rhythm. No rubs, gallops or murmurs. Lungs: clear Abdomen: obese soft, nontender, nondistended. No hepatosplenomegaly. No bruits or masses. Good bowel  sounds. Extremities: no cyanosis, clubbing, rash, edema Neuro: alert & orientedx3, cranial nerves grossly intact. moves all 4 extremities w/o difficulty. Affect pleasant  Improved with milrinone. RHC reviewed with him and Dr. Donata Clay. RV improved and suspect will handle VAD well.   Continue milrinone. Plan VAD Thursday.   Ambulate.   Arvilla Meres, MD  10:36 AM

## 2023-05-01 NOTE — Progress Notes (Signed)
   Palliative Medicine Inpatient Follow Up Note HPI: Joe [name] is a 52 y.o. male with medical history significant for chronic systolic heart failure in setting of ischemic cardiomyopathy, V-fib arrest, hyperlipidemia, obstructive sleep apnea on home nocturnal CPAP, type 2 diabetes mellitus. The Palliative medicine team has been asked to assist in VAD evaluation.   Today's Discussion 05/01/2023  *Please note that this is a verbal dictation therefore any spelling or grammatical errors are due to the "Dragon Medical One" system interpretation.  Chart reviewed inclusive of vital signs, progress notes, laboratory results, and diagnostic images.   I met with Joe [name] and his wife, Joe [name] at bedside this morning. He is in good spirits. He is getting an echocardiogram. Created space and opportunity for patient to explore thoughts feelings and fears regarding current medical situation. Joe [name] shares that he feels calm and is hopeful to walk around more later in the day. He shares understanding and readiness for Thursday's procedure.   Questions and concerns addressed/Palliative Support Provided.   Objective Assessment: Vital Signs Vitals:   05/01/23 0729 05/01/23 1119  BP:  109/77  Pulse: 68 71  Resp: 20 17  Temp: 98 F (36.7 C) 97.6 F (36.4 C)  SpO2: 96% 94%    Intake/Output Summary (Last 24 hours) at 05/01/2023 1238 Last data filed at 05/01/2023 1210 Gross per 24 hour  Intake 617.5 ml  Output 3100 ml  Net -2482.5 ml   Last Weight  Most recent update: 05/01/2023  3:47 AM    Weight  138.3 kg (304 lb 14.4 oz)              Gen:  Middle aged Caucasian M in NAD HEENT: moist mucous membranes CV: Regular rate and rhythm  PULM: On RA, breathing is even and nonlabored ABD: soft/nontender  EXT: No edema  Neuro: Alert and oriented x3    SUMMARY OF RECOMMENDATIONS   Full Code / Full Scope of Care   Plan for VAD on Thursday   Appreciate Chaplain support   PMT will  continue to follow along incrementally  Time Spent: 25  ______________________________________________________________________________________ Lamarr Lulas  Palliative Medicine Team Team Cell Phone: [phone] Please utilize secure chat with additional questions, if there is no response within 30 minutes please call the above phone number  Palliative Medicine Team providers are available by phone from 7am to 7pm daily and can be reached through the team cell phone.  Should this patient require assistance outside of these hours, please call the patient's attending physician.

## 2023-05-02 DIAGNOSIS — I5022 Chronic systolic (congestive) heart failure: Secondary | ICD-10-CM | POA: Diagnosis not present

## 2023-05-02 DIAGNOSIS — I255 Ischemic cardiomyopathy: Secondary | ICD-10-CM

## 2023-05-02 DIAGNOSIS — I509 Heart failure, unspecified: Secondary | ICD-10-CM

## 2023-05-02 LAB — BASIC METABOLIC PANEL
Anion gap: 9 (ref 5–15)
BUN: 22 mg/dL — ABNORMAL HIGH (ref 6–20)
CO2: 28 mmol/L (ref 22–32)
Calcium: 8.9 mg/dL (ref 8.9–10.3)
Chloride: 97 mmol/L — ABNORMAL LOW (ref 98–111)
Creatinine, Ser: 1.49 mg/dL — ABNORMAL HIGH (ref 0.61–1.24)
GFR, Estimated: 56 mL/min — ABNORMAL LOW (ref >60)
Glucose, Bld: 131 mg/dL — ABNORMAL HIGH (ref 70–99)
Potassium: 3.9 mmol/L (ref 3.5–5.1)
Sodium: 134 mmol/L — ABNORMAL LOW (ref 135–145)

## 2023-05-02 LAB — COOXEMETRY PANEL
Carboxyhemoglobin: 2.1 % — ABNORMAL HIGH (ref 0.5–1.5)
Methemoglobin: 0.9 % (ref 0.0–1.5)
O2 Saturation: 69.4 %
Total hemoglobin: 14.6 g/dL (ref 12.0–16.0)

## 2023-05-02 LAB — CBC
HCT: 40.9 % (ref 39.0–52.0)
Hemoglobin: 13.9 g/dL (ref 13.0–17.0)
MCH: 31.7 pg (ref 26.0–34.0)
MCHC: 34 g/dL (ref 30.0–36.0)
MCV: 93.4 fL (ref 80.0–100.0)
Platelets: 162 10*3/uL (ref 150–400)
RBC: 4.38 MIL/uL (ref 4.22–5.81)
RDW: 14.3 % (ref 11.5–15.5)
WBC: 6.6 10*3/uL (ref 4.0–10.5)
nRBC: 0 % (ref 0.0–0.2)

## 2023-05-02 LAB — SURGICAL PCR SCREEN
MRSA, PCR: NEGATIVE
Staphylococcus aureus: NEGATIVE

## 2023-05-02 LAB — APTT: aPTT: 40 s — ABNORMAL HIGH (ref 24–36)

## 2023-05-02 LAB — PROTIME-INR
INR: 1.8 — ABNORMAL HIGH (ref 0.8–1.2)
Prothrombin Time: 20.8 s — ABNORMAL HIGH (ref 11.4–15.2)

## 2023-05-02 LAB — MAGNESIUM: Magnesium: 2.3 mg/dL (ref 1.7–2.4)

## 2023-05-02 LAB — PREPARE RBC (CROSSMATCH)

## 2023-05-02 LAB — ABO/RH: ABO/RH(D): O NEG

## 2023-05-02 MED ORDER — BISACODYL 5 MG PO TBEC
5.0000 mg | DELAYED_RELEASE_TABLET | Freq: Once | ORAL | Status: AC
  Administered 2023-05-02: 5 mg via ORAL
  Filled 2023-05-02: qty 1

## 2023-05-02 MED ORDER — MUPIROCIN 2 % EX OINT
1.0000 | TOPICAL_OINTMENT | Freq: Two times a day (BID) | CUTANEOUS | Status: DC
  Administered 2023-05-02: 1 via NASAL
  Filled 2023-05-02: qty 22

## 2023-05-02 MED ORDER — INSULIN REGULAR(HUMAN) IN NACL 100-0.9 UT/100ML-% IV SOLN
INTRAVENOUS | Status: AC
  Administered 2023-05-03: 2.6 [IU]/h via INTRAVENOUS
  Filled 2023-05-02: qty 100

## 2023-05-02 MED ORDER — ALPRAZOLAM 0.25 MG PO TABS: 0.2500 mg | ORAL_TABLET | ORAL | Status: DC | PRN

## 2023-05-02 MED ORDER — HEPARIN 30,000 UNITS/1000 ML (OHS) CELLSAVER SOLUTION
Status: DC
  Filled 2023-05-02: qty 1000

## 2023-05-02 MED ORDER — CEFAZOLIN IN SODIUM CHLORIDE 3-0.9 GM/100ML-% IV SOLN
3.0000 g | INTRAVENOUS | Status: AC
  Administered 2023-05-03: 3 g via INTRAVENOUS
  Filled 2023-05-02: qty 100

## 2023-05-02 MED ORDER — DIAZEPAM 2 MG PO TABS
5.0000 mg | ORAL_TABLET | Freq: Once | ORAL | Status: AC
  Administered 2023-05-03: 5 mg via ORAL
  Filled 2023-05-02: qty 3

## 2023-05-02 MED ORDER — NOREPINEPHRINE 4 MG/250ML-% IV SOLN
0.0000 ug/min | INTRAVENOUS | Status: AC
  Administered 2023-05-03: 3 ug/min via INTRAVENOUS
  Filled 2023-05-02: qty 250

## 2023-05-02 MED ORDER — EPINEPHRINE HCL 5 MG/250ML IV SOLN IN NS
0.0000 ug/min | INTRAVENOUS | Status: AC
  Administered 2023-05-03: 2 ug/min via INTRAVENOUS
  Filled 2023-05-02: qty 250

## 2023-05-02 MED ORDER — DOPAMINE-DEXTROSE 3.2-5 MG/ML-% IV SOLN
0.0000 ug/kg/min | INTRAVENOUS | Status: DC
  Filled 2023-05-02: qty 250

## 2023-05-02 MED ORDER — TRANEXAMIC ACID 1000 MG/10ML IV SOLN
1.5000 mg/kg/h | INTRAVENOUS | Status: AC
  Administered 2023-05-03: 1.5 mg/kg/h via INTRAVENOUS
  Filled 2023-05-02 (×2): qty 25

## 2023-05-02 MED ORDER — ENSURE ENLIVE PO LIQD
237.0000 mL | Freq: Two times a day (BID) | ORAL | Status: DC
  Administered 2023-05-02 – 2023-05-07 (×6): 237 mL via ORAL

## 2023-05-02 MED ORDER — TRANEXAMIC ACID (OHS) BOLUS VIA INFUSION
15.0000 mg/kg | INTRAVENOUS | Status: AC
Start: 2023-05-03 — End: 2023-05-04
  Administered 2023-05-03: 2047.5 mg via INTRAVENOUS
  Filled 2023-05-02: qty 2048

## 2023-05-02 MED ORDER — DEXMEDETOMIDINE HCL IN NACL 400 MCG/100ML IV SOLN
0.1000 ug/kg/h | INTRAVENOUS | Status: DC
  Filled 2023-05-02: qty 100

## 2023-05-02 MED ORDER — MAGNESIUM SULFATE 50 % IJ SOLN
40.0000 meq | INTRAMUSCULAR | Status: DC
  Filled 2023-05-02: qty 9.85

## 2023-05-02 MED ORDER — NITROGLYCERIN IN D5W 200-5 MCG/ML-% IV SOLN
0.0000 ug/min | INTRAVENOUS | Status: DC
  Filled 2023-05-02: qty 250

## 2023-05-02 MED ORDER — MILRINONE LACTATE IN DEXTROSE 20-5 MG/100ML-% IV SOLN
0.3000 ug/kg/min | INTRAVENOUS | Status: DC
  Filled 2023-05-02: qty 100

## 2023-05-02 MED ORDER — CHLORHEXIDINE GLUCONATE 0.12 % MT SOLN
15.0000 mL | Freq: Once | OROMUCOSAL | Status: AC
Start: 2023-05-03 — End: 2023-05-03
  Administered 2023-05-03: 15 mL via OROMUCOSAL
  Filled 2023-05-02: qty 15

## 2023-05-02 MED ORDER — VASOPRESSIN 20 UNITS/100 ML INFUSION FOR SHOCK
0.0400 [IU]/min | INTRAVENOUS | Status: DC
  Filled 2023-05-02: qty 100

## 2023-05-02 MED ORDER — VANCOMYCIN HCL 1500 MG/300ML IV SOLN
1500.0000 mg | INTRAVENOUS | Status: AC
  Administered 2023-05-03: 1500 mg via INTRAVENOUS
  Filled 2023-05-02: qty 300

## 2023-05-02 MED ORDER — PHENYLEPHRINE HCL-NACL 20-0.9 MG/250ML-% IV SOLN
0.0000 ug/min | INTRAVENOUS | Status: DC
  Filled 2023-05-02: qty 250

## 2023-05-02 MED ORDER — VANCOMYCIN HCL 1 G IV SOLR
1000.0000 mg | INTRAVENOUS | Status: DC
  Filled 2023-05-02: qty 20

## 2023-05-02 MED ORDER — POTASSIUM CHLORIDE 2 MEQ/ML IV SOLN
80.0000 meq | INTRAVENOUS | Status: DC
  Filled 2023-05-02: qty 40

## 2023-05-02 MED ORDER — CHLORHEXIDINE GLUCONATE 4 % EX SOLN
60.0000 mL | Freq: Once | CUTANEOUS | Status: DC
  Filled 2023-05-02: qty 60

## 2023-05-02 MED ORDER — SODIUM CHLORIDE 0.9 % IV SOLN
600.0000 mg | INTRAVENOUS | Status: AC
  Administered 2023-05-03: 600 mg via INTRAVENOUS
  Filled 2023-05-02: qty 10

## 2023-05-02 MED ORDER — TRANEXAMIC ACID (OHS) PUMP PRIME SOLUTION
2.0000 mg/kg | INTRAVENOUS | Status: DC
  Filled 2023-05-02: qty 2.73

## 2023-05-02 MED ORDER — CEFAZOLIN SODIUM-DEXTROSE 2-4 GM/100ML-% IV SOLN
2.0000 g | INTRAVENOUS | Status: AC
  Administered 2023-05-03: 2 g via INTRAVENOUS
  Filled 2023-05-02: qty 100

## 2023-05-02 MED ORDER — DOBUTAMINE-DEXTROSE 4-5 MG/ML-% IV SOLN
2.0000 ug/kg/min | INTRAVENOUS | Status: DC
  Filled 2023-05-02: qty 250

## 2023-05-02 MED ORDER — CHLORHEXIDINE GLUCONATE 4 % EX SOLN
60.0000 mL | Freq: Once | CUTANEOUS | Status: AC
  Administered 2023-05-02: 4 via TOPICAL

## 2023-05-02 MED ORDER — CHLORHEXIDINE GLUCONATE CLOTH 2 % EX PADS
6.0000 | MEDICATED_PAD | Freq: Once | CUTANEOUS | Status: AC
  Administered 2023-05-02: 6 via TOPICAL

## 2023-05-02 MED ORDER — FLUCONAZOLE IN SODIUM CHLORIDE 400-0.9 MG/200ML-% IV SOLN
400.0000 mg | INTRAVENOUS | Status: AC
  Administered 2023-05-03: 400 mg via INTRAVENOUS
  Filled 2023-05-02: qty 200

## 2023-05-02 MED ORDER — TEMAZEPAM 15 MG PO CAPS: 15.0000 mg | ORAL_CAPSULE | Freq: Once | ORAL | Status: DC | PRN

## 2023-05-02 NOTE — Progress Notes (Addendum)
Advanced Heart Failure Rounding Note  PCP-Cardiologist: [name], MD   Subjective:    11/11: Admit for optimization prior to [facility] LVAD implantation   CO-OX 69% on 0.125 milrinone.   CVP 6. Good diuresis with PO Torsemide. Weight down 6 lb over last 2 days.  No dyspnea. Reports more energy with milrinone.   Objective:   Weight Range: (!) 136.5 kg Body mass index is 37.62 kg/m.   Vital Signs:   Temp:  [97.6 F (36.4 C)-98 F (36.7 C)] 97.9 F (36.6 C) (11/13 0533) Pulse Rate:  [64-71] 64 (11/13 0533) Resp:  [13-20] 17 (11/13 0533) BP: (82-115)/(63-79) 105/63 (11/13 0533) SpO2:  [92 %-96 %] 93 % (11/13 0533) Weight:  [136.5 kg] 136.5 kg (11/13 0533) Last BM Date : 04/29/23  Weight change: Filed Weights   04/30/23 1357 05/01/23 0345 05/02/23 0533  Weight: (!) 139.4 kg (!) 138.3 kg (!) 136.5 kg    Intake/Output:   Intake/Output Summary (Last 24 hours) at 05/02/2023 0758 Last data filed at 05/02/2023 0450 Gross per 24 hour  Intake 598.5 ml  Output 4000 ml  Net -3401.5 ml    Physical Exam    General:  Well appearing. Standing at side of bed HEENT: normal Neck: supple. Thick neck. Carotids 2+ bilat; no bruits.  Cor: PMI nondisplaced. Regular rate & rhythm. No rubs, gallops or murmurs. Lungs: clear Abdomen: soft, nontender, nondistended.  Extremities: no cyanosis, clubbing, rash, trace LE edema, chronic venous stasis changes, RUE PICC Neuro: alert & orientedx3. Affect pleasant   Telemetry   V-Paced 60s  EKG    N/a  Labs    CBC Recent Labs    05/01/23 0817 05/02/23 0450  WBC 5.8 6.6  HGB 14.0 13.9  HCT 42.2 40.9  MCV 95.3 93.4  PLT 167 162   Basic Metabolic Panel Recent Labs    96/29/52 0340 05/02/23 0450  NA 136 134*  K 4.1 3.9  CL 99 97*  CO2 28 28  GLUCOSE 157* 131*  BUN 20 22*  CREATININE 1.50* 1.49*  CALCIUM 8.7* 8.9  MG 2.4 2.3   Liver Function Tests No results for input(s): "AST", "ALT", "ALKPHOS", "BILITOT",  "PROT", "ALBUMIN" in the last 72 hours. No results for input(s): "LIPASE", "AMYLASE" in the last 72 hours. Cardiac Enzymes No results for input(s): "CKTOTAL", "CKMB", "CKMBINDEX", "TROPONINI" in the last 72 hours.  BNP: BNP (last 3 results) Recent Labs    03/09/23 1243 03/28/23 2205 04/08/23 1820  BNP 221.8* 178.3* 182.4*    ProBNP (last 3 results) Recent Labs    05/10/22 0905 08/10/22 1534  PROBNP 475* 351*     D-Dimer No results for input(s): "DDIMER" in the last 72 hours. Hemoglobin A1C No results for input(s): "HGBA1C" in the last 72 hours. Fasting Lipid Panel No results for input(s): "CHOL", "HDL", "LDLCALC", "TRIG", "CHOLHDL", "LDLDIRECT" in the last 72 hours. Thyroid Function Tests No results for input(s): "TSH", "T4TOTAL", "T3FREE", "THYROIDAB" in the last 72 hours.  Invalid input(s): "FREET3"  Other results:  Imaging   ECHOCARDIOGRAM LIMITED  Result Date: 05/01/2023    ECHOCARDIOGRAM LIMITED REPORT   Patient Name:   Joe [name] [facility] Date of Exam: 05/01/2023 Medical Rec #:  [id number]              Height:       75.0 in Accession #:    [id number]             Weight:  304.9 lb Date of Birth:  [birth year]               BSA:          2.625 m Patient Age:    52 years               BP:           107/65 mmHg Patient Gender: M                      HR:           67 bpm. Exam Location:  Inpatient Procedure: Limited Echo, Cardiac Doppler, Color Doppler and Intracardiac            Opacification Agent Indications:    CHF I50.9  History:        Patient has prior history of Echocardiogram examinations, most                 recent 02/15/2023. CHF, Angina and CAD, Arrythmias:RBBB,                 Tachycardia and Ventricular Fibrillation, Signs/Symptoms:Chest                 Pain, Shortness of Breath and Syncope; Risk                 Factors:Hypertension, Sleep Apnea, Diabetes and Dyslipidemia.  Sonographer:    Lucendia Herrlich RCS Referring Phys: [phone] PETER VANTRIGT  IMPRESSIONS  1. Left ventricular ejection fraction, by estimation, is 20 to 25%. The left ventricle has severely decreased function. The left ventricle demonstrates global hypokinesis. The left ventricular internal cavity size was severely dilated.  2. Right ventricular systolic function is moderately reduced. The right ventricular size is normal. There is normal pulmonary artery systolic pressure. The estimated right ventricular systolic pressure is 35.9 mmHg.  3. The mitral valve is normal in structure. Mild mitral valve regurgitation. No evidence of mitral stenosis.  4. The aortic valve is normal in structure. Aortic valve regurgitation is not visualized. No aortic stenosis is present.  5. The inferior vena cava is dilated in size with >50% respiratory variability, suggesting right atrial pressure of 8 mmHg. FINDINGS  Left Ventricle: Left ventricular ejection fraction, by estimation, is 20 to 25%. The left ventricle has severely decreased function. The left ventricle demonstrates global hypokinesis. Definity contrast agent was given IV to delineate the left ventricular endocardial borders. The left ventricular internal cavity size was severely dilated. There is no left ventricular hypertrophy. Right Ventricle: The right ventricular size is normal. No increase in right ventricular wall thickness. Right ventricular systolic function is moderately reduced. There is normal pulmonary artery systolic pressure. The tricuspid regurgitant velocity is 2.64 m/s, and with an assumed right atrial pressure of 8 mmHg, the estimated right ventricular systolic pressure is 35.9 mmHg. Left Atrium: Left atrial size was normal in size. Right Atrium: Right atrial size was normal in size. Pericardium: There is no evidence of pericardial effusion. Mitral Valve: The mitral valve is normal in structure. Mild mitral valve regurgitation. No evidence of mitral valve stenosis. Tricuspid Valve: The tricuspid valve is normal in structure.  Tricuspid valve regurgitation is mild . No evidence of tricuspid stenosis. Aortic Valve: The aortic valve is normal in structure. Aortic valve regurgitation is not visualized. No aortic stenosis is present. Pulmonic Valve: The pulmonic valve was normal in structure. Pulmonic valve regurgitation is not visualized. No evidence of pulmonic stenosis. Aorta: The aortic  root is normal in size and structure. Venous: The inferior vena cava is dilated in size with greater than 50% respiratory variability, suggesting right atrial pressure of 8 mmHg. IAS/Shunts: No atrial level shunt detected by color flow Doppler. Additional Comments: A device lead is visualized.  LEFT VENTRICLE PLAX 2D LVIDd:         7.50 cm   Diastology LVIDs:         7.20 cm   LV e' medial:    6.41 cm/s LV PW:         0.80 cm   LV E/e' medial:  18.4 LV IVS:        0.80 cm   LV e' lateral:   6.65 cm/s LVOT diam:     2.40 cm   LV E/e' lateral: 17.7 LVOT Area:     4.52 cm  RIGHT VENTRICLE             IVC RV S prime:     10.80 cm/s  IVC diam: 3.30 cm TAPSE (M-mode): 1.3 cm LEFT ATRIUM         Index LA diam:    5.90 cm 2.25 cm/m   AORTA Ao Root diam: 3.50 cm Ao Asc diam:  3.10 cm MITRAL VALVE                TRICUSPID VALVE MV Area (PHT): 5.66 cm     TR Peak grad:   27.9 mmHg MV Decel Time: 134 msec     TR Vmax:        264.00 cm/s MV E velocity: 118.00 cm/s MV A velocity: 43.00 cm/s   SHUNTS MV E/A ratio:  2.74         Systemic Diam: 2.40 cm Armanda Magic MD Electronically signed by Armanda Magic MD Signature Date/Time: 05/01/2023/9:22:36 AM    Final     Medications:    Scheduled Medications:  allopurinol  100 mg Oral Daily   amiodarone  200 mg Oral BID   aspirin EC  81 mg Oral Daily   Chlorhexidine Gluconate Cloth  6 each Topical Daily   enoxaparin (LOVENOX) injection  60 mg Subcutaneous Q24H   escitalopram  10 mg Oral QHS   ezetimibe  10 mg Oral Daily   gabapentin  300 mg Oral TID   levothyroxine  25 mcg Oral Daily   magnesium oxide  400 mg Oral  Daily   mexiletine  250 mg Oral BID   pantoprazole  40 mg Oral Daily   potassium chloride SA  40 mEq Oral TID   ranolazine  500 mg Oral BID   rosuvastatin  40 mg Oral Daily   sodium chloride flush  10-40 mL Intracatheter Q12H   sodium chloride flush  3 mL Intravenous Q12H   sodium chloride flush  3 mL Intravenous Q12H   spironolactone  25 mg Oral Daily   torsemide  60 mg Oral BID    Infusions:  milrinone 0.125 mcg/kg/min (05/01/23 2144)    PRN Medications: acetaminophen, mouth rinse, polyvinyl alcohol, sodium chloride flush, sodium chloride flush, sodium chloride flush  Patient Profile   Joe [name] is a 52 y.o. male with h/o morbid obesity, DM2, HTN, HL, OSA, CAD and systolic HF. Admitted for surgical optimization prior to [facility] III VAD implant on 11/14.   Assessment/Plan   1.  Chronic Systolic HF: - due to iCM - Echo (8/20): EF 20-25%  - Echo (9/22): 35-40% - Echo (6/23): EF 30-35% - Echo (8/24): EF 20-25%,  RV ok. - R/LHC (9/24): stable 3v CAD, elevated biventricular filling pressures with low PAPi; CI 2.2 - NYHA IV. He appears to have end-stage HF. Not a transplant candidate per [facility]. Now admitted for surgical optimization prior to VAD implant on 11/14 with Dr. Donata Clay. - RHC (11/24): RA 9, PA mean 28, PCWP 19, Fick CI 1.9, TD CI 2.2, PVR 1.6, PAPi 2.4 - Echo 05/01/23: EF 20%, RV moderately down ( improved) - CO-OX 69% on milrinone 0.125 - CVP 6 - Continue torsemide 60 mg bid. - Keep off Entresto and Jardiance pre-op - Continue spiro 25 mg daily   2. H/o VT Arrest  Hx PVCs - Followed by Dr. Elberta Fortis - recurrent episodes - Admitted 10/10-10/13/24 w/ VF treated w/ ICD shock>>loaded w/ IV amio  - Re-admitted 04/08/23 w/ VT arrest, treated w/ appropriate ICD shock x 2 + brief bystander CPR  - in setting of underling CM w/ EF < 35% + intermittent hypokalemia  - Continue amiodarone 200 mg bid - Continue mexiletine 250 mg bid  3. CAD with chronic stable angina - s/p  multiple stents to LAD and LCX - Cath 9/22 stable CAD - LHC (9/24) with stable 3v CAD - Continue ASA. - Continue Ranexa 500 mg bid.  - Continue Crestor 40mg  daily - Off Imdur with soft BP   4. AKI  - SCr 1.5 (baseline 1.1) - Keep off SGLT2i pre-op - Avoid hypotension - Diuresis as above - Follow BMET   5. DM2 - A1c 7.2 (1/24)   6. OSA - Not using CPAP, says machine was recalled a while ago. Needs new machine. - Followed outpatient   7. Obesity - Body mass index is 37.62 kg/m. - Has struggled with weight loss - Continue Ozempic outpatient, may have better success with tirzepatide.  Will check with unit to see if family can visit outside of visitation hours prior to VAD implant tomorrow am.     Length of Stay: 2  FINCH, LINDSAY N, PA-C  05/02/2023, 7:58 AM  Advanced Heart Failure Team Pager [phone] (M-F; 7a - 5p)  Please contact [facility] for night-coverage after hours (5p -7a ) and weekends on [email or web address]  Patient seen and examined with the above-signed Advanced Practice Provider and/or Housestaff. I personally reviewed laboratory data, imaging studies and relevant notes. I independently examined the patient and formulated the important aspects of the plan. I have edited the note to reflect any of my changes or salient points. I have personally discussed the plan with the patient and/or family.  Doing well on milrinone CVP and co-ox ok.   Denies CP or SOB   General:  Sitting up in chair . No resp difficulty HEENT: normal Neck: supple. no JVD. Carotids 2+ bilat; no bruits. No lymphadenopathy or thryomegaly appreciated. Cor: PMI nondisplaced. Regular rate & rhythm. No rubs, gallops or murmurs. Lungs: clear Abdomen: obese  soft, nontender, nondistended. No hepatosplenomegaly. No bruits or masses. Good bowel sounds. Extremities: no cyanosis, clubbing, rash, edema Neuro: alert & orientedx3, cranial nerves grossly intact. moves all 4 extremities w/o difficulty.  Affect pleasant  He is optimized for VAD placement tomorrow. D/w Dr. Donata Clay. Continue milrinone. VAD in am.  Arvilla Meres, MD  7:59 PM

## 2023-05-02 NOTE — TOC Progression Note (Signed)
Transition of Care [facility]) - Progression Note    Patient Details  Name: Joe [name] MRN: [id number] Date of Birth: January 31, 1971  Transition of Care [facility]) CM/SW Contact  Nicanor Bake Phone Number: [phone] 05/02/2023, 10:28 AM  Clinical Narrative:   HF CSW met with pt at bedside. Pt appeared to be in a good mood. Pt stated that he was excited about surgery tomorrow and ready to start the healing journey. CSW encouraged pt to keep the positive attitude. CSW will follow up with pt after surgery.   TOC will continue following.     Expected Discharge Plan: Home/Self Care Barriers to Discharge: Continued Medical Work up  Expected Discharge Plan and Services       Living arrangements for the past 2 months: Single Family Home                                       Social Determinants of Health (SDOH) Interventions SDOH Screenings   Food Insecurity: No Food Insecurity (04/30/2023)  Housing: Low Risk  (04/30/2023)  Transportation Needs: No Transportation Needs (04/30/2023)  Utilities: Not At Risk (04/30/2023)  Financial Resource Strain: Low Risk  (04/20/2023)  Social Connections: Unknown (10/28/2021)   Received from [facility], [facility]  Tobacco Use: Low Risk  (04/30/2023)    Readmission Risk Interventions     No data to display

## 2023-05-02 NOTE — Anesthesia Preprocedure Evaluation (Signed)
Anesthesia Evaluation  Patient identified by MRN, date of birth, ID band Patient awake    Reviewed: Allergy & Precautions, NPO status , Patient's Chart, lab work & pertinent test results  Airway Mallampati: III  TM Distance: >3 FB Neck ROM: Full    Dental no notable dental hx.    Pulmonary sleep apnea    Pulmonary exam normal        Cardiovascular hypertension, + CAD, + Cardiac Stents and +CHF  + dysrhythmias  Rhythm:Regular Rate:Normal  ECHO:  1. Left ventricular ejection fraction, by estimation, is 20 to 25%. The left ventricle has severely decreased function. The left ventricle demonstrates global hypokinesis. The left ventricular internal cavity size was severely dilated.  2. Right ventricular systolic function is moderately reduced. The right ventricular size is normal. There is normal pulmonary artery systolic pressure. The estimated right ventricular systolic pressure is 35.9 mmHg.  3. The mitral valve is normal in structure. Mild mitral valve regurgitation. No evidence of mitral stenosis.  4. The aortic valve is normal in structure. Aortic valve regurgitation is not visualized. No aortic stenosis is present.  5. The inferior vena cava is dilated in size with >50% respiratory variability, suggesting right atrial pressure of 8 mmHg.    RA = 9 RV = 42/12 PA = 38/16 (28) PCW = 19 (v = 26) Fick cardiac output/index = 4.9/1.9 Thermo CO/CI = 5.7/2.2 PVR = 1.6 WU Ao sat = 94% PA sat = 57%, 62% PAPi = 2.4    Neuro/Psych negative neurological ROS  negative psych ROS   GI/Hepatic Neg liver ROS,GERD  ,,  Endo/Other  diabetes, Type 2    Renal/GU   negative genitourinary   Musculoskeletal negative musculoskeletal ROS (+)    Abdominal Normal abdominal exam  (+)   Peds  Hematology Lab Results      Component                Value               Date                      WBC                      6.6                  05/02/2023                HGB                      13.9                05/02/2023                HCT                      40.9                05/02/2023                MCV                      93.4                05/02/2023                PLT  162                 05/02/2023             Lab Results      Component                Value               Date                      NA                       134 (L)             05/02/2023                K                        3.9                 05/02/2023                CO2                      28                  05/02/2023                GLUCOSE                  131 (H)             05/02/2023                BUN                      22 (H)              05/02/2023                CREATININE               1.49 (H)            05/02/2023                CALCIUM                  8.9                 05/02/2023                EGFR                     81                  08/10/2022                GFRNONAA                 56 (L)              05/02/2023          ,   Anesthesia Other Findings   Reproductive/Obstetrics                             Anesthesia Physical Anesthesia Plan  ASA: 4  Anesthesia Plan: General   Post-op  Pain Management:    Induction: Intravenous  PONV Risk Score and Plan: 2 and Midazolam and Treatment may vary due to age or medical condition  Airway Management Planned: Mask and Oral ETT  Additional Equipment: Arterial line, CVP, PA Cath, TEE, 3D TEE and Ultrasound Guidance Line Placement  Intra-op Plan:   Post-operative Plan: Post-operative intubation/ventilation  Informed Consent: I have reviewed the patients History and Physical, chart, labs and discussed the procedure including the risks, benefits and alternatives for the proposed anesthesia with the patient or authorized representative who has indicated his/her understanding and acceptance.     Dental advisory given  Plan  Discussed with: CRNA  Anesthesia Plan Comments:        Anesthesia Quick Evaluation

## 2023-05-02 NOTE — Progress Notes (Signed)
Pt Joe [name] has been discussed with the VAD Medical Review board on 04/23/23. The team feels as if the patient is a good candidate for Destination LVAD therapy. The patient meets criteria for a LVAD implant as listed below:  1) NYHA Class:IV documented on 05/02/23  2) Has a left ventricular ejection fraction (LVEF) < 25%   *EF 20-25% by echo 05/01/23  3) Must meet one of the following:   Is inotrope dependent   *On inotropes Milrinone .164mcg/kg/min started 04/30/23  OR  Has a Cardiac Index (CI) < 2.2  L/min/m2 while not on inotropes:   *CI: 1.9 04/30/23  4) Must meet one of the following:   __X____ Is on optimal medical management (OMM), based on current heart failure practice guidelines for at least 45 of the last 60 days and are failing to respond   ______ Has advanced heart failure for at least 14 days and are dependent on an intra-aortic balloon pump (IABP) or similar temporary mechanical circulatory support for at least 7 days      ____ IABP inserted (date) ____      ____ Impella inserted (date) ____  5)  Social work and palliative care evaluations demonstrate appropriate support system in place for discharge to home with a VAD and that end of life discussions have taken place. Both services have expressed no concern regarding patient's candidacy.         *Social work consult (date): 04/20/23        *Palliative Care Consult (date): 04/30/23  6)  Primary caretaker identified that can be taught along with the patient how to manage        the VAD equipment.        *Name: Joe [name]  7)  Deemed appropriate by our financial coordinator: Joe [name]        Prior approval: [phone]   8)  VAD Coordinator, Joe [name] has met with patient and caregiver, shown them the VAD equipment and discussed with the patient and caregiver about lifestyle changes necessary for success on mechanical circulatory device.        *Met with pt Joe [name] and wife Joe [name] on 02/20/23.        *Consent for VAD Evaluation/Caregiver Agreement/HIPPA Release/Photo Release signed on 02/20/23.   9)  Six Minute Walk: 05/01/23  746 ft   10) KCCQ:05/01/23   [facility] Cardiomyopathy Questionnaire  KCCQ-12    1 a. Ability to shower/bathe Slightly limited   1 b. Ability to walk 1 block Moderately limited   1 c. Ability to hurry/jog Extremely limited   2. Edema feet/ankles/legs 3 or more times per week but not everyday   3. Limited by fatigue Several times per day   4. Limited by dyspnea 3 or more times per week but not everyday   5. Sitting up / on 3+ pillows 3 or more times per week but not everyday   6. Limited enjoyment of life It has limited my enjoyment of life quite a bit   7. Rest of life w/ symptoms Not at all satisfied    8 a. Participation in hobbies Moderately limited   8 b. Participation in chores Limited quite a bit   8 c. Visiting family/friends Moderately limited      11)  Intermacs profile: 3  INTERMACS 1: Critical cardiogenic shock describes a patient who is "crashing and burning", in which a patient has life-threatening hypotension and rapidly escalating inotropic pressor support, with critical organ  hypoperfusion often confirmed by worsening acidosis and lactate levels.  INTERMACS 2: Progressive decline describes a patient who has been demonstrated "dependent" on inotropic support but nonetheless shows signs of continuing deterioration in nutrition, renal function, fluid retention, or other major status indicator. Patient profile 2 can also describe a patient with refractory volume overload, perhaps with evidence of impaired perfusion, in whom inotropic infusions cannot be maintained due to tachyarrhythmias, clinical ischemia, or other intolerance.  INTERMACS 3: Stable but inotrope dependent describes a patient who is clinically stable on mild-moderate doses of intravenous inotropes (or has a temporary circulatory support device) after  repeated documentation of failure to wean without symptomatic hypotension, worsening symptoms, or progressive organ dysfunction (usually renal). It is critical to monitor nutrition, renal function, fluid balance, and overall status carefully in order to distinguish between a   patient who is truly stable at Patient Profile 3 and a patient who has unappreciated decline rendering them Patient Profile 2. This patient may be either at home or in the hospital.      INTERMACS 4: Resting symptoms describes a patient who is at home on oral therapy but frequently has symptoms of congestion at rest or with activities of daily living (ADL). He or she may have orthopnea, shortness of breath during ADL such as dressing or bathing, gastrointestinal symptoms (abdominal discomfort, nausea, poor appetite), disabling ascites or severe lower extremity edema. This patient should be carefully considered for more intensive management and surveillance programs, which may in some cases, reveal poor compliance that would compromise outcomes with any therapy.   .   INTERMACS 5: Exertion Intolerant describes a patient who is comfortable at rest but unable to engage in any activity, living predominantly within the house or housebound. This patient has no congestive symptoms, but may have chronically elevated volume status, frequently with renal dysfunction, and may be characterized as exercise intolerant.      INTERMACS 6: Exertion Limited also describes a patient who is comfortable at rest without evidence of fluid overload, but who is able to do some mild activity. Activities of daily living are comfortable and minor activities outside the home such as visiting friends or going to a restaurant can be performed, but fatigue results within a few minutes of any meaningful physical exertion. This patient has occasional episodes of worsening symptoms and is likely to have had a hospitalization for heart failure within the past year.    INTERMACS 7: Advanced NYHA Class 3 describes a patient who is clinically stable with a reasonable level of comfortable activity, despite history of previous decompensation that is not recent. This patient is usually able to walk more than a block. Any decompensation requiring intravenous diuretics or hospitalization within the previous month should make this person a Patient Profile 6 or lower.

## 2023-05-02 NOTE — Progress Notes (Signed)
2 Days Post-Op Procedure(s) (LRB): RIGHT HEART CATH (N/A) S   Patient examined, images of echocardiogram, CT scan of chest, cardiac catheterization personally reviewed and counseled patient and wife.  I have discussed the patient and his current medical status again with Dr. Gala Romney for coordination of care.   HPI: 52 year old obese diabetic non-smoker with ischemic cardiomyopathy and fairly longstanding CHF with reduced LVEF is being evaluated for implantable VAD therapy.  He has been hospitalized 3 times in the last 6 weeks for heart failure-volume overload as well as ventricular arrhythmia and shock.  His ejection fraction is 20 to 25% by echo. His PCI vessels are patent.  Echo shows moderate RV dysfunction however right heart cath shows papi <1.0. He has no significant valvular disease and LV diastolic diameter 7.0 cm.   The patient has been evaluated at [facility] for possible transplant therapy but was felt not to be a candidate currently because of obesity, recommended to lose 25 more pounds.  Because of his large BMI and blood type 0 he could be facing a long time on the transplant waiting list.  He was recommended for VAD implantation as a probable bridge  to transplant.   The patient has had no major surgery other than the pacemaker AICD implantation and right knee arthroscopy.  His diabetes is well-controlled.  Endorgan function is satisfactory and PFTs are near normal for mechanics of breathing.  CT scan chest performed last year is unremarkable.   Patient is not currently on Plavix.  He has never been on Coumadin or Eliquis.  He denies history of bleeding tendencies or previous blood transfusion.   Patient is retired from a career in [employer] loss adjuster and he has been married for several years.  Update May 02, 2023  Patient examined, results of right heart catheterization performed earlier this week and images of echo echocardiogram performed earlier this week personally  reviewed and discussed with patient and wife.  The patient has been optimized for implantation of HeartMate 3 VAD which was scheduled for tomorrow morning.  The patient's RV function appears to be adequate for him to benefit from the implantable VAD His renal function and hemodynamics has been optimized with low-dose milrinone infusion leading up to the operation.  The patient and wife understand the benefits of the procedure as well as the potential risks of bleeding, stroke, RV failure, organ failure, death.  He agrees to proceed with surgery tomorrow morning.       Past Medical History:  Diagnosis Date   Abnormal EKG     Acute systolic heart failure (HCC) 09/06/2017   Angina pectoris (HCC) 09/05/2017   Body mass index 45.0-49.9, adult (HCC)     CAD S/P percutaneous coronary angioplasty 09/07/2017    mLAD and dLAD PCI with DES 09/06/17   CHF (congestive heart failure) (HCC)     Chronic systolic (congestive) heart failure (HCC) 03/19/2019   Complication of anesthesia 1995    "had hard time waking up; breathing w/vasectomy"   Coronary artery disease     Erectile dysfunction     Essential hypertension, benign     Fatigue     GERD (gastroesophageal reflux disease)     Gout     High cholesterol      "just started tx yesterday" (09/06/2017)   History of gout      "RX prn" (09/06/2017)   Ischemic cardiomyopathy 09/07/2017    EF 25-35%   Muscular chest pain     Nodular basal cell carcinoma (  BCC) 02/19/2019    Below Left Nare (MOH's)   Obesity     Obstructive sleep apnea     OSA on CPAP     Plantar fasciitis     Pre-operative clearance 02/11/2018   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/02/2019    [employer]   Right bundle branch block        Objective: Vital signs in last 24 hours: Temp:  [97.6 F (36.4 C)-98 F (36.7 C)] 97.8 F (36.6 C) (11/13 1648) Pulse Rate:  [60-68] 63 (11/13 1648) Cardiac Rhythm: Ventricular paced;Other (Comment) (11/13  0700) Resp:  [17-20] 19 (11/13 1648) BP: (99-115)/(63-79) 109/75 (11/13 1648) SpO2:  [92 %-98 %] 98 % (11/13 1648) Weight:  [136.5 kg] 136.5 kg (11/13 0533)  Hemodynamic parameters for last 24 hours: CVP:  [4 mmHg-10 mmHg] 4 mmHg  Intake/Output from previous day: 11/12 0701 - 11/13 0700 In: 598.5 [P.O.:480; I.V.:118.5] Out: 4000 [Urine:4000] Intake/Output this shift: Total I/O In: 600 [P.O.:600] Out: 1700 [Urine:1700]      Exam    General- alert and comfortable    Neck- no JVD, no cervical adenopathy palpable, no carotid bruit   Lungs- clear without rales, wheezes   Cor- regular rate and rhythm, no murmur , gallop   Abdomen- soft, non-tender   Extremities - warm, non-tender, minimal edema.  PICC line in right upper extremity.   Neuro- oriented, appropriate, no focal weakness    Lab Results: Recent Labs    05/01/23 0817 05/02/23 0450  WBC 5.8 6.6  HGB 14.0 13.9  HCT 42.2 40.9  PLT 167 162   BMET:  Recent Labs    05/01/23 0340 05/02/23 0450  NA 136 134*  K 4.1 3.9  CL 99 97*  CO2 28 28  GLUCOSE 157* 131*  BUN 20 22*  CREATININE 1.50* 1.49*  CALCIUM 8.7* 8.9    PT/INR: No results for input(s): "LABPROT", "INR" in the last 72 hours. ABG    Component Value Date/Time   PHART 7.406 02/23/2023 1707   HCO3 30.1 (H) 04/30/2023 1136   HCO3 26.3 04/30/2023 1136   TCO2 32 04/30/2023 1136   TCO2 28 04/30/2023 1136   O2SAT 69.4 05/02/2023 0450   CBG (last 3)  Recent Labs    04/30/23 0928 04/30/23 1158  GLUCAP 148* 122*    Assessment/Plan: S/P Procedure(s) (LRB): RIGHT HEART CATH (N/A) Plan implantation of HeartMate 3 ventricular assist device in a.m.   LOS: 2 days    Joe [name] 05/02/2023

## 2023-05-03 ENCOUNTER — Inpatient Hospital Stay (HOSPITAL_COMMUNITY): Admission: RE | Admit: 2023-05-03 | Payer: 59 | Source: Home / Self Care | Admitting: Cardiothoracic Surgery

## 2023-05-03 ENCOUNTER — Encounter (HOSPITAL_COMMUNITY): Payer: 59

## 2023-05-03 ENCOUNTER — Inpatient Hospital Stay (HOSPITAL_COMMUNITY)
Admission: RE | Disposition: A | Discharge status: Home / Self Care | Payer: Self-pay | Source: Home / Self Care | Attending: Internal Medicine

## 2023-05-03 ENCOUNTER — Inpatient Hospital Stay (HOSPITAL_COMMUNITY): Payer: 59 | Admitting: Certified Registered Nurse Anesthetist

## 2023-05-03 ENCOUNTER — Ambulatory Visit: Payer: 59 | Admitting: Podiatry

## 2023-05-03 ENCOUNTER — Inpatient Hospital Stay (HOSPITAL_COMMUNITY): Payer: 59

## 2023-05-03 ENCOUNTER — Encounter (HOSPITAL_COMMUNITY): Payer: Self-pay | Admitting: Internal Medicine

## 2023-05-03 ENCOUNTER — Other Ambulatory Visit: Payer: Self-pay

## 2023-05-03 DIAGNOSIS — I509 Heart failure, unspecified: Secondary | ICD-10-CM | POA: Diagnosis not present

## 2023-05-03 DIAGNOSIS — I110 Hypertensive heart disease with heart failure: Secondary | ICD-10-CM | POA: Diagnosis not present

## 2023-05-03 DIAGNOSIS — I5022 Chronic systolic (congestive) heart failure: Secondary | ICD-10-CM | POA: Diagnosis not present

## 2023-05-03 DIAGNOSIS — I255 Ischemic cardiomyopathy: Secondary | ICD-10-CM | POA: Diagnosis not present

## 2023-05-03 HISTORY — PX: TEE WITHOUT CARDIOVERSION: SHX5443

## 2023-05-03 HISTORY — PX: INSERTION OF IMPLANTABLE LEFT VENTRICULAR ASSIST DEVICE: SHX5866

## 2023-05-03 LAB — POCT I-STAT, CHEM 8
BUN: 25 mg/dL — ABNORMAL HIGH (ref 6–20)
BUN: 23 mg/dL — ABNORMAL HIGH (ref 6–20)
BUN: 25 mg/dL — ABNORMAL HIGH (ref 6–20)
BUN: 23 mg/dL — ABNORMAL HIGH (ref 6–20)
BUN: 27 mg/dL — ABNORMAL HIGH (ref 6–20)
BUN: 21 mg/dL — ABNORMAL HIGH (ref 6–20)
Calcium, Ion: 1.13 mmol/L — ABNORMAL LOW (ref 1.15–1.40)
Calcium, Ion: 0.98 mmol/L — ABNORMAL LOW (ref 1.15–1.40)
Calcium, Ion: 1.11 mmol/L — ABNORMAL LOW (ref 1.15–1.40)
Calcium, Ion: 0.94 mmol/L — ABNORMAL LOW (ref 1.15–1.40)
Calcium, Ion: 0.99 mmol/L — ABNORMAL LOW (ref 1.15–1.40)
Calcium, Ion: 1.07 mmol/L — ABNORMAL LOW (ref 1.15–1.40)
Chloride: 96 mmol/L — ABNORMAL LOW (ref 98–111)
Chloride: 97 mmol/L — ABNORMAL LOW (ref 98–111)
Chloride: 98 mmol/L (ref 98–111)
Chloride: 96 mmol/L — ABNORMAL LOW (ref 98–111)
Chloride: 97 mmol/L — ABNORMAL LOW (ref 98–111)
Chloride: 99 mmol/L (ref 98–111)
Creatinine, Ser: 1.4 mg/dL — ABNORMAL HIGH (ref 0.61–1.24)
Creatinine, Ser: 1.4 mg/dL — ABNORMAL HIGH (ref 0.61–1.24)
Creatinine, Ser: 1.5 mg/dL — ABNORMAL HIGH (ref 0.61–1.24)
Creatinine, Ser: 1.3 mg/dL — ABNORMAL HIGH (ref 0.61–1.24)
Creatinine, Ser: 1.3 mg/dL — ABNORMAL HIGH (ref 0.61–1.24)
Creatinine, Ser: 1.2 mg/dL (ref 0.61–1.24)
Glucose, Bld: 130 mg/dL — ABNORMAL HIGH (ref 70–99)
Glucose, Bld: 117 mg/dL — ABNORMAL HIGH (ref 70–99)
Glucose, Bld: 141 mg/dL — ABNORMAL HIGH (ref 70–99)
Glucose, Bld: 118 mg/dL — ABNORMAL HIGH (ref 70–99)
Glucose, Bld: 176 mg/dL — ABNORMAL HIGH (ref 70–99)
Glucose, Bld: 181 mg/dL — ABNORMAL HIGH (ref 70–99)
HCT: 42 % (ref 39.0–52.0)
HCT: 32 % — ABNORMAL LOW (ref 39.0–52.0)
HCT: 40 % (ref 39.0–52.0)
HCT: 26 % — ABNORMAL LOW (ref 39.0–52.0)
HCT: 31 % — ABNORMAL LOW (ref 39.0–52.0)
HCT: 32 % — ABNORMAL LOW (ref 39.0–52.0)
Hemoglobin: 14.3 g/dL (ref 13.0–17.0)
Hemoglobin: 10.9 g/dL — ABNORMAL LOW (ref 13.0–17.0)
Hemoglobin: 13.6 g/dL (ref 13.0–17.0)
Hemoglobin: 10.5 g/dL — ABNORMAL LOW (ref 13.0–17.0)
Hemoglobin: 8.8 g/dL — ABNORMAL LOW (ref 13.0–17.0)
Hemoglobin: 10.9 g/dL — ABNORMAL LOW (ref 13.0–17.0)
Potassium: 3.7 mmol/L (ref 3.5–5.1)
Potassium: 4 mmol/L (ref 3.5–5.1)
Potassium: 4.2 mmol/L (ref 3.5–5.1)
Potassium: 3.7 mmol/L (ref 3.5–5.1)
Potassium: 3.8 mmol/L (ref 3.5–5.1)
Potassium: 3.2 mmol/L — ABNORMAL LOW (ref 3.5–5.1)
Sodium: 139 mmol/L (ref 135–145)
Sodium: 136 mmol/L (ref 135–145)
Sodium: 137 mmol/L (ref 135–145)
Sodium: 138 mmol/L (ref 135–145)
Sodium: 139 mmol/L (ref 135–145)
Sodium: 141 mmol/L (ref 135–145)
TCO2: 29 mmol/L (ref 22–32)
TCO2: 28 mmol/L (ref 22–32)
TCO2: 28 mmol/L (ref 22–32)
TCO2: 24 mmol/L (ref 22–32)
TCO2: 30 mmol/L (ref 22–32)
TCO2: 27 mmol/L (ref 22–32)

## 2023-05-03 LAB — GLUCOSE, CAPILLARY
Glucose-Capillary: 175 mg/dL — ABNORMAL HIGH (ref 70–99)
Glucose-Capillary: 162 mg/dL — ABNORMAL HIGH (ref 70–99)
Glucose-Capillary: 157 mg/dL — ABNORMAL HIGH (ref 70–99)
Glucose-Capillary: 151 mg/dL — ABNORMAL HIGH (ref 70–99)
Glucose-Capillary: 165 mg/dL — ABNORMAL HIGH (ref 70–99)
Glucose-Capillary: 156 mg/dL — ABNORMAL HIGH (ref 70–99)
Glucose-Capillary: 158 mg/dL — ABNORMAL HIGH (ref 70–99)
Glucose-Capillary: 163 mg/dL — ABNORMAL HIGH (ref 70–99)
Glucose-Capillary: 168 mg/dL — ABNORMAL HIGH (ref 70–99)

## 2023-05-03 LAB — ECHO INTRAOPERATIVE TEE
AR max vel: 1.93 cm2
AV Area VTI: 1.86 cm2
AV Area mean vel: 2.02 cm2
AV Mean grad: 2 mm[Hg]
AV Peak grad: 4 mm[Hg]
Ao pk vel: 1 m/s
Area-P 1/2: 4.83 cm2
Height: 75 in
MV M vel: 4.55 m/s
MV Peak grad: 82.8 mm[Hg]
MV VTI: 2.99 cm2
Radius: 0.9 cm
S' Lateral: 6.4 cm
Weight: 4840 [oz_av]

## 2023-05-03 LAB — CBC
HCT: 42.1 % (ref 39.0–52.0)
HCT: 32.6 % — ABNORMAL LOW (ref 39.0–52.0)
HCT: 28.8 % — ABNORMAL LOW (ref 39.0–52.0)
Hemoglobin: 14.4 g/dL (ref 13.0–17.0)
Hemoglobin: 11.3 g/dL — ABNORMAL LOW (ref 13.0–17.0)
Hemoglobin: 9.9 g/dL — ABNORMAL LOW (ref 13.0–17.0)
MCH: 31.8 pg (ref 26.0–34.0)
MCH: 32.4 pg (ref 26.0–34.0)
MCH: 31.8 pg (ref 26.0–34.0)
MCHC: 34.2 g/dL (ref 30.0–36.0)
MCHC: 34.7 g/dL (ref 30.0–36.0)
MCHC: 34.4 g/dL (ref 30.0–36.0)
MCV: 92.9 fL (ref 80.0–100.0)
MCV: 93.4 fL (ref 80.0–100.0)
MCV: 92.6 fL (ref 80.0–100.0)
Platelets: 172 10*3/uL (ref 150–400)
Platelets: 135 10*3/uL — ABNORMAL LOW (ref 150–400)
Platelets: 152 10*3/uL (ref 150–400)
RBC: 4.53 MIL/uL (ref 4.22–5.81)
RBC: 3.49 MIL/uL — ABNORMAL LOW (ref 4.22–5.81)
RBC: 3.11 MIL/uL — ABNORMAL LOW (ref 4.22–5.81)
RDW: 14.3 % (ref 11.5–15.5)
RDW: 14.2 % (ref 11.5–15.5)
RDW: 14.3 % (ref 11.5–15.5)
WBC: 6.7 10*3/uL (ref 4.0–10.5)
WBC: 15.3 10*3/uL — ABNORMAL HIGH (ref 4.0–10.5)
WBC: 15.9 10*3/uL — ABNORMAL HIGH (ref 4.0–10.5)
nRBC: 0 % (ref 0.0–0.2)
nRBC: 0 % (ref 0.0–0.2)
nRBC: 0 % (ref 0.0–0.2)

## 2023-05-03 LAB — POCT I-STAT 7, (LYTES, BLD GAS, ICA,H+H)
Acid-Base Excess: 1 mmol/L (ref 0.0–2.0)
Acid-Base Excess: 4 mmol/L — ABNORMAL HIGH (ref 0.0–2.0)
Acid-Base Excess: 4 mmol/L — ABNORMAL HIGH (ref 0.0–2.0)
Acid-Base Excess: 2 mmol/L (ref 0.0–2.0)
Acid-Base Excess: 1 mmol/L (ref 0.0–2.0)
Acid-base deficit: 1 mmol/L (ref 0.0–2.0)
Acid-base deficit: 3 mmol/L — ABNORMAL HIGH (ref 0.0–2.0)
Bicarbonate: 28.1 mmol/L — ABNORMAL HIGH (ref 20.0–28.0)
Bicarbonate: 30.2 mmol/L — ABNORMAL HIGH (ref 20.0–28.0)
Bicarbonate: 28.3 mmol/L — ABNORMAL HIGH (ref 20.0–28.0)
Bicarbonate: 27.3 mmol/L (ref 20.0–28.0)
Bicarbonate: 27 mmol/L (ref 20.0–28.0)
Bicarbonate: 24.8 mmol/L (ref 20.0–28.0)
Bicarbonate: 23 mmol/L (ref 20.0–28.0)
Calcium, Ion: 1.12 mmol/L — ABNORMAL LOW (ref 1.15–1.40)
Calcium, Ion: 0.98 mmol/L — ABNORMAL LOW (ref 1.15–1.40)
Calcium, Ion: 0.94 mmol/L — ABNORMAL LOW (ref 1.15–1.40)
Calcium, Ion: 0.97 mmol/L — ABNORMAL LOW (ref 1.15–1.40)
Calcium, Ion: 0.89 mmol/L — CL (ref 1.15–1.40)
Calcium, Ion: 1.08 mmol/L — ABNORMAL LOW (ref 1.15–1.40)
Calcium, Ion: 1.15 mmol/L (ref 1.15–1.40)
HCT: 41 % (ref 39.0–52.0)
HCT: 33 % — ABNORMAL LOW (ref 39.0–52.0)
HCT: 31 % — ABNORMAL LOW (ref 39.0–52.0)
HCT: 32 % — ABNORMAL LOW (ref 39.0–52.0)
HCT: 32 % — ABNORMAL LOW (ref 39.0–52.0)
HCT: 33 % — ABNORMAL LOW (ref 39.0–52.0)
HCT: 29 % — ABNORMAL LOW (ref 39.0–52.0)
Hemoglobin: 13.9 g/dL (ref 13.0–17.0)
Hemoglobin: 11.2 g/dL — ABNORMAL LOW (ref 13.0–17.0)
Hemoglobin: 10.5 g/dL — ABNORMAL LOW (ref 13.0–17.0)
Hemoglobin: 10.9 g/dL — ABNORMAL LOW (ref 13.0–17.0)
Hemoglobin: 10.9 g/dL — ABNORMAL LOW (ref 13.0–17.0)
Hemoglobin: 11.2 g/dL — ABNORMAL LOW (ref 13.0–17.0)
Hemoglobin: 9.9 g/dL — ABNORMAL LOW (ref 13.0–17.0)
O2 Saturation: 100 %
O2 Saturation: 100 %
O2 Saturation: 100 %
O2 Saturation: 97 %
O2 Saturation: 100 %
O2 Saturation: 100 %
O2 Saturation: 99 %
Patient temperature: 35.7
Patient temperature: 37.5
Potassium: 3.5 mmol/L (ref 3.5–5.1)
Potassium: 4.1 mmol/L (ref 3.5–5.1)
Potassium: 4 mmol/L (ref 3.5–5.1)
Potassium: 3.6 mmol/L (ref 3.5–5.1)
Potassium: 3.1 mmol/L — ABNORMAL LOW (ref 3.5–5.1)
Potassium: 3 mmol/L — ABNORMAL LOW (ref 3.5–5.1)
Potassium: 3.7 mmol/L (ref 3.5–5.1)
Sodium: 139 mmol/L (ref 135–145)
Sodium: 136 mmol/L (ref 135–145)
Sodium: 137 mmol/L (ref 135–145)
Sodium: 139 mmol/L (ref 135–145)
Sodium: 139 mmol/L (ref 135–145)
Sodium: 140 mmol/L (ref 135–145)
Sodium: 140 mmol/L (ref 135–145)
TCO2: 30 mmol/L (ref 22–32)
TCO2: 32 mmol/L (ref 22–32)
TCO2: 30 mmol/L (ref 22–32)
TCO2: 29 mmol/L (ref 22–32)
TCO2: 28 mmol/L (ref 22–32)
TCO2: 26 mmol/L (ref 22–32)
TCO2: 24 mmol/L (ref 22–32)
pCO2 arterial: 54.6 mm[Hg] — ABNORMAL HIGH (ref 32–48)
pCO2 arterial: 49.9 mm[Hg] — ABNORMAL HIGH (ref 32–48)
pCO2 arterial: 41.3 mm[Hg] (ref 32–48)
pCO2 arterial: 45.2 mm[Hg] (ref 32–48)
pCO2 arterial: 46.3 mm[Hg] (ref 32–48)
pCO2 arterial: 43.2 mm[Hg] (ref 32–48)
pCO2 arterial: 44.8 mm[Hg] (ref 32–48)
pH, Arterial: 7.319 — ABNORMAL LOW (ref 7.35–7.45)
pH, Arterial: 7.39 (ref 7.35–7.45)
pH, Arterial: 7.443 (ref 7.35–7.45)
pH, Arterial: 7.389 (ref 7.35–7.45)
pH, Arterial: 7.373 (ref 7.35–7.45)
pH, Arterial: 7.361 (ref 7.35–7.45)
pH, Arterial: 7.321 — ABNORMAL LOW (ref 7.35–7.45)
pO2, Arterial: 395 mm[Hg] — ABNORMAL HIGH (ref 83–108)
pO2, Arterial: 290 mm[Hg] — ABNORMAL HIGH (ref 83–108)
pO2, Arterial: 241 mm[Hg] — ABNORMAL HIGH (ref 83–108)
pO2, Arterial: 94 mm[Hg] (ref 83–108)
pO2, Arterial: 396 mm[Hg] — ABNORMAL HIGH (ref 83–108)
pO2, Arterial: 298 mm[Hg] — ABNORMAL HIGH (ref 83–108)
pO2, Arterial: 145 mm[Hg] — ABNORMAL HIGH (ref 83–108)

## 2023-05-03 LAB — POCT I-STAT EG7
Acid-Base Excess: 4 mmol/L — ABNORMAL HIGH (ref 0.0–2.0)
Acid-base deficit: 1 mmol/L (ref 0.0–2.0)
Bicarbonate: 30.4 mmol/L — ABNORMAL HIGH (ref 20.0–28.0)
Bicarbonate: 26.3 mmol/L (ref 20.0–28.0)
Calcium, Ion: 1.02 mmol/L — ABNORMAL LOW (ref 1.15–1.40)
Calcium, Ion: 1.09 mmol/L — ABNORMAL LOW (ref 1.15–1.40)
HCT: 35 % — ABNORMAL LOW (ref 39.0–52.0)
HCT: 33 % — ABNORMAL LOW (ref 39.0–52.0)
Hemoglobin: 11.9 g/dL — ABNORMAL LOW (ref 13.0–17.0)
Hemoglobin: 11.2 g/dL — ABNORMAL LOW (ref 13.0–17.0)
O2 Saturation: 85 %
O2 Saturation: 85 %
Potassium: 3.7 mmol/L (ref 3.5–5.1)
Potassium: 3 mmol/L — ABNORMAL LOW (ref 3.5–5.1)
Sodium: 137 mmol/L (ref 135–145)
Sodium: 141 mmol/L (ref 135–145)
TCO2: 32 mmol/L (ref 22–32)
TCO2: 28 mmol/L (ref 22–32)
pCO2, Ven: 53.6 mm[Hg] (ref 44–60)
pCO2, Ven: 51.9 mm[Hg] (ref 44–60)
pH, Ven: 7.361 (ref 7.25–7.43)
pH, Ven: 7.312 (ref 7.25–7.43)
pO2, Ven: 53 mm[Hg] — ABNORMAL HIGH (ref 32–45)
pO2, Ven: 55 mm[Hg] — ABNORMAL HIGH (ref 32–45)

## 2023-05-03 LAB — BASIC METABOLIC PANEL
Anion gap: 12 (ref 5–15)
Anion gap: 11 (ref 5–15)
Anion gap: 8 (ref 5–15)
BUN: 25 mg/dL — ABNORMAL HIGH (ref 6–20)
BUN: 20 mg/dL (ref 6–20)
BUN: 18 mg/dL (ref 6–20)
CO2: 27 mmol/L (ref 22–32)
CO2: 24 mmol/L (ref 22–32)
CO2: 24 mmol/L (ref 22–32)
Calcium: 9 mg/dL (ref 8.9–10.3)
Calcium: 7.9 mg/dL — ABNORMAL LOW (ref 8.9–10.3)
Calcium: 8.2 mg/dL — ABNORMAL LOW (ref 8.9–10.3)
Chloride: 96 mmol/L — ABNORMAL LOW (ref 98–111)
Chloride: 104 mmol/L (ref 98–111)
Chloride: 105 mmol/L (ref 98–111)
Creatinine, Ser: 1.6 mg/dL — ABNORMAL HIGH (ref 0.61–1.24)
Creatinine, Ser: 1.38 mg/dL — ABNORMAL HIGH (ref 0.61–1.24)
Creatinine, Ser: 1.48 mg/dL — ABNORMAL HIGH (ref 0.61–1.24)
GFR, Estimated: 52 mL/min — ABNORMAL LOW (ref >60)
GFR, Estimated: >60 mL/min (ref >60)
GFR, Estimated: 57 mL/min — ABNORMAL LOW (ref >60)
Glucose, Bld: 124 mg/dL — ABNORMAL HIGH (ref 70–99)
Glucose, Bld: 174 mg/dL — ABNORMAL HIGH (ref 70–99)
Glucose, Bld: 164 mg/dL — ABNORMAL HIGH (ref 70–99)
Potassium: 4 mmol/L (ref 3.5–5.1)
Potassium: 3 mmol/L — ABNORMAL LOW (ref 3.5–5.1)
Potassium: 3.7 mmol/L (ref 3.5–5.1)
Sodium: 135 mmol/L (ref 135–145)
Sodium: 139 mmol/L (ref 135–145)
Sodium: 137 mmol/L (ref 135–145)

## 2023-05-03 LAB — COOXEMETRY PANEL
Carboxyhemoglobin: 1.7 % — ABNORMAL HIGH (ref 0.5–1.5)
Carboxyhemoglobin: 2 % — ABNORMAL HIGH (ref 0.5–1.5)
Methemoglobin: <0.7 % (ref 0.0–1.5)
Methemoglobin: <0.7 % (ref 0.0–1.5)
O2 Saturation: 59.2 %
O2 Saturation: 87 %
Total hemoglobin: 15 g/dL (ref 12.0–16.0)
Total hemoglobin: 11.4 g/dL — ABNORMAL LOW (ref 12.0–16.0)

## 2023-05-03 LAB — DIC (DISSEMINATED INTRAVASCULAR COAGULATION)PANEL
D-Dimer, Quant: 0.46 ug{FEU}/mL (ref 0.00–0.50)
Fibrinogen: 333 mg/dL (ref 210–475)
INR: 1.4 — ABNORMAL HIGH (ref 0.8–1.2)
Platelets: 135 10*3/uL — ABNORMAL LOW (ref 150–400)
Prothrombin Time: 17 s — ABNORMAL HIGH (ref 11.4–15.2)
Smear Review: NONE SEEN
aPTT: 31 s (ref 24–36)

## 2023-05-03 LAB — HEMOGLOBIN AND HEMATOCRIT, BLOOD
HCT: 28.4 % — ABNORMAL LOW (ref 39.0–52.0)
Hemoglobin: 9.6 g/dL — ABNORMAL LOW (ref 13.0–17.0)

## 2023-05-03 LAB — MAGNESIUM
Magnesium: 2.5 mg/dL — ABNORMAL HIGH (ref 1.7–2.4)
Magnesium: 2.1 mg/dL (ref 1.7–2.4)
Magnesium: 2.6 mg/dL — ABNORMAL HIGH (ref 1.7–2.4)

## 2023-05-03 LAB — PLATELET COUNT: Platelets: 113 10*3/uL — ABNORMAL LOW (ref 150–400)

## 2023-05-03 LAB — PREPARE RBC (CROSSMATCH)

## 2023-05-03 LAB — SARS CORONAVIRUS 2 BY RT PCR: SARS Coronavirus 2 by RT PCR: NEGATIVE

## 2023-05-03 SURGERY — INSERTION OF IMPLANTABLE LEFT VENTRICULAR ASSIST DEVICE
Anesthesia: General | Site: Chest

## 2023-05-03 MED ORDER — DEXMEDETOMIDINE HCL IN NACL 400 MCG/100ML IV SOLN
0.0000 ug/kg/h | INTRAVENOUS | Status: DC
Start: 2023-05-03 — End: 2023-05-04
  Administered 2023-05-03: 0.7 ug/kg/h via INTRAVENOUS
  Administered 2023-05-03: 0.8 ug/kg/h via INTRAVENOUS
  Administered 2023-05-03: 1.1 ug/kg/h via INTRAVENOUS
  Administered 2023-05-04: 0.6 ug/kg/h via INTRAVENOUS
  Administered 2023-05-04: 1.1 ug/kg/h via INTRAVENOUS
  Filled 2023-05-03 (×6): qty 100

## 2023-05-03 MED ORDER — MORPHINE SULFATE (PF) 2 MG/ML IV SOLN
1.0000 mg | INTRAVENOUS | Status: DC | PRN
Start: 2023-05-03 — End: 2023-05-07
  Administered 2023-05-03 – 2023-05-05 (×7): 2 mg via INTRAVENOUS
  Filled 2023-05-03: qty 2
  Filled 2023-05-03 (×5): qty 1

## 2023-05-03 MED ORDER — HEMOSTATIC AGENTS (NO CHARGE) OPTIME
TOPICAL | Status: DC | PRN
  Administered 2023-05-03 (×7): 1 via TOPICAL

## 2023-05-03 MED ORDER — DEXTROSE 50 % IV SOLN: 0.0000 mL | INTRAVENOUS | Status: DC | PRN

## 2023-05-03 MED ORDER — ONDANSETRON HCL 4 MG/2ML IJ SOLN
4.0000 mg | Freq: Four times a day (QID) | INTRAMUSCULAR | Status: DC | PRN
  Administered 2023-05-06 – 2023-05-11 (×2): 4 mg via INTRAVENOUS
  Filled 2023-05-03 (×2): qty 2

## 2023-05-03 MED ORDER — ALBUMIN HUMAN 5 % IV SOLN: INTRAVENOUS | Status: DC | PRN

## 2023-05-03 MED ORDER — NOREPINEPHRINE 4 MG/250ML-% IV SOLN
0.0000 ug/min | INTRAVENOUS | Status: DC
  Administered 2023-05-03: 8 ug/min via INTRAVENOUS
  Administered 2023-05-04: 12 ug/min via INTRAVENOUS
  Administered 2023-05-04: 9 ug/min via INTRAVENOUS
  Administered 2023-05-04: 14 ug/min via INTRAVENOUS
  Administered 2023-05-04: 15 ug/min via INTRAVENOUS
  Administered 2023-05-04: 12 ug/min via INTRAVENOUS
  Filled 2023-05-03 (×6): qty 250

## 2023-05-03 MED ORDER — ORAL CARE MOUTH RINSE: 15.0000 mL | OROMUCOSAL | Status: DC | PRN

## 2023-05-03 MED ORDER — POTASSIUM CHLORIDE 10 MEQ/50ML IV SOLN
10.0000 meq | Freq: Once | INTRAVENOUS | Status: AC
  Administered 2023-05-03: 10 meq via INTRAVENOUS
  Filled 2023-05-03: qty 50

## 2023-05-03 MED ORDER — SODIUM CHLORIDE 0.9% FLUSH
3.0000 mL | Freq: Three times a day (TID) | INTRAVENOUS | Status: DC
  Administered 2023-05-04 – 2023-05-07 (×5): 3 mL via INTRAVENOUS

## 2023-05-03 MED ORDER — ORAL CARE MOUTH RINSE
15.0000 mL | OROMUCOSAL | Status: DC
  Administered 2023-05-03: 15 mL via OROMUCOSAL

## 2023-05-03 MED ORDER — BISACODYL 10 MG RE SUPP: 10.0000 mg | Freq: Every day | RECTAL | Status: DC

## 2023-05-03 MED ORDER — FAMOTIDINE IN NACL 20-0.9 MG/50ML-% IV SOLN
20.0000 mg | Freq: Two times a day (BID) | INTRAVENOUS | Status: DC
  Administered 2023-05-03: 20 mg via INTRAVENOUS
  Filled 2023-05-03: qty 50

## 2023-05-03 MED ORDER — DEXMEDETOMIDINE HCL IN NACL 400 MCG/100ML IV SOLN
INTRAVENOUS | Status: DC | PRN
  Administered 2023-05-03: .3 ug/kg/h via INTRAVENOUS

## 2023-05-03 MED ORDER — CHLORHEXIDINE GLUCONATE 0.12 % MT SOLN
OROMUCOSAL | Status: AC
  Administered 2023-05-03: 15 mL via OROMUCOSAL
  Filled 2023-05-03: qty 15

## 2023-05-03 MED ORDER — ASPIRIN 300 MG RE SUPP: 300.0000 mg | Freq: Every day | RECTAL | Status: DC

## 2023-05-03 MED ORDER — TRAMADOL HCL 50 MG PO TABS
50.0000 mg | ORAL_TABLET | ORAL | Status: DC | PRN
  Administered 2023-05-04: 100 mg via ORAL
  Administered 2023-05-04 – 2023-05-10 (×2): 50 mg via ORAL
  Filled 2023-05-03: qty 2
  Filled 2023-05-03: qty 1
  Filled 2023-05-03: qty 2

## 2023-05-03 MED ORDER — ROCURONIUM BROMIDE 10 MG/ML (PF) SYRINGE
PREFILLED_SYRINGE | INTRAVENOUS | Status: AC
  Filled 2023-05-03: qty 30

## 2023-05-03 MED ORDER — POTASSIUM CHLORIDE 10 MEQ/50ML IV SOLN
10.0000 meq | INTRAVENOUS | Status: AC
  Administered 2023-05-03 – 2023-05-04 (×3): 10 meq via INTRAVENOUS
  Filled 2023-05-03: qty 50

## 2023-05-03 MED ORDER — MAGNESIUM SULFATE 4 GM/100ML IV SOLN
4.0000 g | Freq: Once | INTRAVENOUS | Status: AC
  Administered 2023-05-03: 4 g via INTRAVENOUS
  Filled 2023-05-03: qty 100

## 2023-05-03 MED ORDER — ROCURONIUM BROMIDE 100 MG/10ML IV SOLN
INTRAVENOUS | Status: DC | PRN
  Administered 2023-05-03: 50 mg via INTRAVENOUS
  Administered 2023-05-03: 30 mg via INTRAVENOUS
  Administered 2023-05-03: 20 mg via INTRAVENOUS
  Administered 2023-05-03: 50 mg via INTRAVENOUS
  Administered 2023-05-03: 80 mg via INTRAVENOUS
  Administered 2023-05-03: 20 mg via INTRAVENOUS
  Administered 2023-05-03: 50 mg via INTRAVENOUS

## 2023-05-03 MED ORDER — ACETAMINOPHEN 500 MG PO TABS
1000.0000 mg | ORAL_TABLET | Freq: Four times a day (QID) | ORAL | Status: AC
  Administered 2023-05-04 – 2023-05-08 (×18): 1000 mg via ORAL
  Filled 2023-05-03 (×18): qty 2

## 2023-05-03 MED ORDER — FENTANYL CITRATE (PF) 250 MCG/5ML IJ SOLN
INTRAMUSCULAR | Status: AC
  Filled 2023-05-03: qty 5

## 2023-05-03 MED ORDER — SODIUM CHLORIDE 0.9 % IV SOLN
600.0000 mg | Freq: Once | INTRAVENOUS | Status: AC
  Administered 2023-05-04: 600 mg via INTRAVENOUS
  Filled 2023-05-03: qty 10

## 2023-05-03 MED ORDER — MIDAZOLAM HCL 2 MG/2ML IJ SOLN
2.0000 mg | INTRAMUSCULAR | Status: DC | PRN
  Administered 2023-05-03 – 2023-05-04 (×3): 2 mg via INTRAVENOUS
  Filled 2023-05-03 (×3): qty 2

## 2023-05-03 MED ORDER — MILRINONE LACTATE IN DEXTROSE 20-5 MG/100ML-% IV SOLN: 0.2500 ug/kg/min | INTRAVENOUS | Status: DC

## 2023-05-03 MED ORDER — VANCOMYCIN HCL IN DEXTROSE 1-5 GM/200ML-% IV SOLN
1000.0000 mg | Freq: Two times a day (BID) | INTRAVENOUS | Status: AC
  Administered 2023-05-03 – 2023-05-04 (×3): 1000 mg via INTRAVENOUS
  Filled 2023-05-03 (×3): qty 200

## 2023-05-03 MED ORDER — PANTOPRAZOLE SODIUM 40 MG PO TBEC: 40.0000 mg | DELAYED_RELEASE_TABLET | Freq: Every day | ORAL | Status: DC

## 2023-05-03 MED ORDER — LACTATED RINGERS IV SOLN: INTRAVENOUS | Status: AC

## 2023-05-03 MED ORDER — ACETAMINOPHEN 160 MG/5ML PO SOLN
650.0000 mg | Freq: Once | ORAL | Status: AC
  Administered 2023-05-03: 650 mg
  Filled 2023-05-03: qty 20.3

## 2023-05-03 MED ORDER — MIDAZOLAM HCL (PF) 10 MG/2ML IJ SOLN
INTRAMUSCULAR | Status: AC
  Filled 2023-05-03: qty 2

## 2023-05-03 MED ORDER — LACTATED RINGERS IV SOLN: INTRAVENOUS | Status: DC | PRN

## 2023-05-03 MED ORDER — VANCOMYCIN HCL 1000 MG IV SOLR
INTRAVENOUS | Status: DC | PRN
  Administered 2023-05-03: 1000 mg

## 2023-05-03 MED ORDER — PHENYLEPHRINE HCL (PRESSORS) 10 MG/ML IV SOLN
INTRAVENOUS | Status: DC | PRN
  Administered 2023-05-03 (×2): 80 ug via INTRAVENOUS
  Administered 2023-05-03: 160 ug via INTRAVENOUS

## 2023-05-03 MED ORDER — SODIUM CHLORIDE 0.9 % IR SOLN
Status: DC | PRN
  Administered 2023-05-03: 5000 mL

## 2023-05-03 MED ORDER — PHENYLEPHRINE 80 MCG/ML (10ML) SYRINGE FOR IV PUSH (FOR BLOOD PRESSURE SUPPORT)
PREFILLED_SYRINGE | INTRAVENOUS | Status: AC
  Filled 2023-05-03: qty 10

## 2023-05-03 MED ORDER — EPINEPHRINE HCL 5 MG/250ML IV SOLN IN NS
2.0000 ug/min | INTRAVENOUS | Status: DC
  Administered 2023-05-04: 6 ug/min via INTRAVENOUS
  Administered 2023-05-04 – 2023-05-05 (×2): 7 ug/min via INTRAVENOUS
  Administered 2023-05-05 – 2023-05-07 (×3): 6 ug/min via INTRAVENOUS
  Administered 2023-05-07: 7 ug/min via INTRAVENOUS
  Administered 2023-05-08: 4 ug/min via INTRAVENOUS
  Administered 2023-05-08: 7 ug/min via INTRAVENOUS
  Administered 2023-05-09: 4 ug/min via INTRAVENOUS
  Filled 2023-05-03 (×10): qty 250

## 2023-05-03 MED ORDER — SODIUM CHLORIDE 0.9 % IV SOLN: 250.0000 mL | INTRAVENOUS | Status: AC

## 2023-05-03 MED ORDER — HEPARIN SODIUM (PORCINE) 1000 UNIT/ML IJ SOLN
INTRAMUSCULAR | Status: DC | PRN
  Administered 2023-05-03: 49000 [IU] via INTRAVENOUS

## 2023-05-03 MED ORDER — SODIUM CHLORIDE 0.45 % IV SOLN: INTRAVENOUS | Status: AC

## 2023-05-03 MED ORDER — ACETAMINOPHEN 650 MG RE SUPP: 650.0000 mg | Freq: Once | RECTAL | Status: AC

## 2023-05-03 MED ORDER — CHLORHEXIDINE GLUCONATE 0.12 % MT SOLN
15.0000 mL | OROMUCOSAL | Status: AC
  Administered 2023-05-03: 15 mL via OROMUCOSAL
  Filled 2023-05-03: qty 15

## 2023-05-03 MED ORDER — OXYCODONE HCL 5 MG PO TABS
5.0000 mg | ORAL_TABLET | ORAL | Status: DC | PRN
  Administered 2023-05-04 – 2023-05-07 (×14): 10 mg via ORAL
  Administered 2023-05-09 – 2023-05-17 (×3): 5 mg via ORAL
  Filled 2023-05-03 (×13): qty 2
  Filled 2023-05-03 (×2): qty 1
  Filled 2023-05-03 (×2): qty 2
  Filled 2023-05-03: qty 1

## 2023-05-03 MED ORDER — FLUCONAZOLE IN SODIUM CHLORIDE 400-0.9 MG/200ML-% IV SOLN
400.0000 mg | Freq: Once | INTRAVENOUS | Status: AC
  Administered 2023-05-04: 400 mg via INTRAVENOUS
  Filled 2023-05-03: qty 200

## 2023-05-03 MED ORDER — SODIUM CHLORIDE 0.9 % IV SOLN
20.0000 ug | Freq: Once | INTRAVENOUS | Status: AC
  Administered 2023-05-03: 20 ug via INTRAVENOUS
  Filled 2023-05-03: qty 5

## 2023-05-03 MED ORDER — SODIUM CHLORIDE 0.9 % IV SOLN: INTRAVENOUS | Status: AC

## 2023-05-03 MED ORDER — DOCUSATE SODIUM 100 MG PO CAPS: 200.0000 mg | ORAL_CAPSULE | Freq: Every day | ORAL | Status: DC

## 2023-05-03 MED ORDER — ACETAMINOPHEN 160 MG/5ML PO SOLN
1000.0000 mg | Freq: Four times a day (QID) | ORAL | Status: AC
  Administered 2023-05-04 (×2): 1000 mg
  Filled 2023-05-03 (×2): qty 40.6

## 2023-05-03 MED ORDER — ORAL CARE MOUTH RINSE
15.0000 mL | OROMUCOSAL | Status: DC
  Administered 2023-05-03 – 2023-05-04 (×9): 15 mL via OROMUCOSAL

## 2023-05-03 MED ORDER — CEFAZOLIN SODIUM-DEXTROSE 2-4 GM/100ML-% IV SOLN
2.0000 g | Freq: Three times a day (TID) | INTRAVENOUS | Status: AC
  Administered 2023-05-03 – 2023-05-05 (×6): 2 g via INTRAVENOUS
  Filled 2023-05-03 (×6): qty 100

## 2023-05-03 MED ORDER — PROTAMINE SULFATE 10 MG/ML IV SOLN
INTRAVENOUS | Status: DC | PRN
  Administered 2023-05-03: 370 mg via INTRAVENOUS
  Administered 2023-05-03: 30 mg via INTRAVENOUS

## 2023-05-03 MED ORDER — ALBUMIN HUMAN 5 % IV SOLN
250.0000 mL | INTRAVENOUS | Status: AC | PRN
  Administered 2023-05-03 (×4): 12.5 g via INTRAVENOUS
  Filled 2023-05-03 (×2): qty 250

## 2023-05-03 MED ORDER — CALCIUM CHLORIDE 10 % IV SOLN
INTRAVENOUS | Status: DC | PRN
  Administered 2023-05-03 (×2): 500 mg via INTRAVENOUS

## 2023-05-03 MED ORDER — PROPOFOL 10 MG/ML IV BOLUS
INTRAVENOUS | Status: DC | PRN
  Administered 2023-05-03: 50 mg via INTRAVENOUS

## 2023-05-03 MED ORDER — HEPARIN SODIUM (PORCINE) 1000 UNIT/ML IJ SOLN
INTRAMUSCULAR | Status: AC
  Filled 2023-05-03: qty 1

## 2023-05-03 MED ORDER — POTASSIUM CHLORIDE 10 MEQ/50ML IV SOLN
10.0000 meq | INTRAVENOUS | Status: AC
  Administered 2023-05-03 (×3): 10 meq via INTRAVENOUS
  Filled 2023-05-03: qty 50

## 2023-05-03 MED ORDER — PROPOFOL 10 MG/ML IV BOLUS
INTRAVENOUS | Status: AC
  Filled 2023-05-03: qty 20

## 2023-05-03 MED ORDER — ASPIRIN 325 MG PO TBEC
325.0000 mg | DELAYED_RELEASE_TABLET | Freq: Every day | ORAL | Status: DC
  Administered 2023-05-04 – 2023-05-08 (×5): 325 mg via ORAL
  Filled 2023-05-03 (×5): qty 1

## 2023-05-03 MED ORDER — INSULIN REGULAR(HUMAN) IN NACL 100-0.9 UT/100ML-% IV SOLN
INTRAVENOUS | Status: DC
  Administered 2023-05-03: 9 [IU]/h via INTRAVENOUS
  Administered 2023-05-04: 10.5 [IU]/h via INTRAVENOUS
  Filled 2023-05-03 (×2): qty 100

## 2023-05-03 MED ORDER — SODIUM CHLORIDE (PF) 0.9 % IJ SOLN
OROMUCOSAL | Status: DC | PRN
  Administered 2023-05-03 (×4): 4 mL via TOPICAL

## 2023-05-03 MED ORDER — CHLORHEXIDINE GLUCONATE 0.12 % MT SOLN: 15.0000 mL | OROMUCOSAL | Status: AC

## 2023-05-03 MED ORDER — SODIUM CHLORIDE 0.9% FLUSH
3.0000 mL | Freq: Two times a day (BID) | INTRAVENOUS | Status: DC
  Administered 2023-05-04 – 2023-05-20 (×26): 3 mL via INTRAVENOUS

## 2023-05-03 MED ORDER — BISACODYL 5 MG PO TBEC
10.0000 mg | DELAYED_RELEASE_TABLET | Freq: Every day | ORAL | Status: DC
  Administered 2023-05-04 – 2023-05-13 (×6): 10 mg via ORAL
  Filled 2023-05-03 (×7): qty 2

## 2023-05-03 MED ORDER — MIDAZOLAM HCL (PF) 5 MG/ML IJ SOLN
INTRAMUSCULAR | Status: DC | PRN
  Administered 2023-05-03 (×5): 2 mg via INTRAVENOUS

## 2023-05-03 MED ORDER — TRANEXAMIC ACID 1000 MG/10ML IV SOLN
1.5000 mg/kg/h | INTRAVENOUS | Status: DC
  Filled 2023-05-03: qty 25

## 2023-05-03 MED ORDER — ASPIRIN 81 MG PO CHEW
324.0000 mg | CHEWABLE_TABLET | Freq: Every day | ORAL | Status: DC
  Filled 2023-05-03: qty 4

## 2023-05-03 MED ORDER — VANCOMYCIN HCL 1000 MG IV SOLR
INTRAVENOUS | Status: AC
  Filled 2023-05-03: qty 40

## 2023-05-03 MED ORDER — VASOPRESSIN 20 UNITS/100 ML INFUSION FOR SHOCK
0.0000 [IU]/min | INTRAVENOUS | Status: DC
  Administered 2023-05-04 – 2023-05-05 (×3): 0.03 [IU]/min via INTRAVENOUS
  Administered 2023-05-05 – 2023-05-07 (×3): 0.02 [IU]/min via INTRAVENOUS
  Filled 2023-05-03 (×8): qty 100

## 2023-05-03 MED ORDER — SODIUM CHLORIDE 0.9 % IV SOLN: INTRAVENOUS | Status: DC | PRN

## 2023-05-03 MED ORDER — HEPARIN SODIUM (PORCINE) 1000 UNIT/ML IJ SOLN
INTRAMUSCULAR | Status: AC
  Filled 2023-05-03: qty 20

## 2023-05-03 MED ORDER — FENTANYL CITRATE (PF) 100 MCG/2ML IJ SOLN
INTRAMUSCULAR | Status: DC | PRN
  Administered 2023-05-03: 150 ug via INTRAVENOUS
  Administered 2023-05-03: 50 ug via INTRAVENOUS
  Administered 2023-05-03: 200 ug via INTRAVENOUS
  Administered 2023-05-03: 150 ug via INTRAVENOUS
  Administered 2023-05-03: 100 ug via INTRAVENOUS

## 2023-05-03 SURGICAL SUPPLY — 115 items
ADAPTER CARDIO PERF ANTE/RETRO (ADAPTER) IMPLANT
ANTEGRADE CPLG (MISCELLANEOUS) IMPLANT
BAG DECANTER FOR FLEXI CONT (MISCELLANEOUS) ×2 IMPLANT
BLADE CLIPPER SURG (BLADE) ×2 IMPLANT
BLADE STERNUM SYSTEM 6 (BLADE) ×2 IMPLANT
BLADE SURG 10 STRL SS (BLADE) IMPLANT
BLADE SURG 11 STRL SS (BLADE) IMPLANT
BLADE SURG 12 STRL SS (BLADE) ×2 IMPLANT
BLADE SURG 15 STRL LF DISP TIS (BLADE) IMPLANT
BLADE SURG 15 STRL SS (BLADE) ×2
CANISTER SUCT 3000ML PPV (MISCELLANEOUS) ×2 IMPLANT
CANNULA ARTERIAL NVNT 3/8 20FR (MISCELLANEOUS) ×2 IMPLANT
CANNULA ARTERIAL NVNT 3/8 22FR (MISCELLANEOUS) IMPLANT
CANNULA MC2 2 STG 36/46 CONN (CANNULA) IMPLANT
CANNULA SUMP PERICARDIAL (CANNULA) ×2 IMPLANT
CANNULA VENOUS LOW PROF 34X46 (CANNULA) ×2 IMPLANT
CATH FOLEY 2WAY SLVR 5CC 14FR (CATHETERS) ×2 IMPLANT
CATH HEART VENT LEFT (CATHETERS) IMPLANT
CATH ROBINSON RED A/P 18FR (CATHETERS) ×4 IMPLANT
CATH THORACIC 28FR (CATHETERS) IMPLANT
CATH THORACIC 28FR RT ANG (CATHETERS) IMPLANT
CHLORAPREP W/TINT 26 (MISCELLANEOUS) ×2 IMPLANT
CNTNR URN SCR LID CUP LEK RST (MISCELLANEOUS) IMPLANT
CONN ST 1/4X3/8 BEN (MISCELLANEOUS) IMPLANT
CONT SPEC 4OZ STRL OR WHT (MISCELLANEOUS) ×2
CONTAINER PROTECT SURGISLUSH (MISCELLANEOUS) ×4 IMPLANT
COVER BACK TABLE 60X90IN (DRAPES) IMPLANT
DERMABOND ADVANCED .7 DNX12 (GAUZE/BANDAGES/DRESSINGS) IMPLANT
DRAIN CHANNEL 28F RND 3/8 FF (WOUND CARE) IMPLANT
DRAIN CHANNEL 32F RND 10.7 FF (WOUND CARE) IMPLANT
DRAPE CV SPLIT W-CLR ANES SCRN (DRAPES) ×2 IMPLANT
DRAPE INCISE IOBAN 66X45 STRL (DRAPES) ×4 IMPLANT
DRAPE PERI GROIN 82X75IN TIB (DRAPES) ×2 IMPLANT
DRAPE SLUSH/WARMER DISC (DRAPES) ×2 IMPLANT
DRSG AQUACEL AG ADV 3.5X14 (GAUZE/BANDAGES/DRESSINGS) ×2 IMPLANT
ELECT BLADE 4.0 EZ CLEAN MEGAD (MISCELLANEOUS) ×4
ELECT BLADE 6.5 EXT (BLADE) ×2 IMPLANT
ELECT CAUTERY BLADE 6.4 (BLADE) ×2 IMPLANT
ELECT REM PT RETURN 9FT ADLT (ELECTROSURGICAL) ×4
ELECTRODE BLDE 4.0 EZ CLN MEGD (MISCELLANEOUS) ×2 IMPLANT
ELECTRODE REM PT RTRN 9FT ADLT (ELECTROSURGICAL) ×2 IMPLANT
FELT TEFLON 6X6 (MISCELLANEOUS) ×2 IMPLANT
GAUZE 4X4 16PLY ~~LOC~~+RFID DBL (SPONGE) ×2 IMPLANT
GAUZE SPONGE 4X4 12PLY STRL (GAUZE/BANDAGES/DRESSINGS) ×4 IMPLANT
GLOVE BIO SURGEON STRL SZ 6.5 (GLOVE) IMPLANT
GLOVE BIO SURGEON STRL SZ7.5 (GLOVE) ×6 IMPLANT
GLOVE BIOGEL PI IND STRL 6.5 (GLOVE) IMPLANT
GLOVE SS BIOGEL STRL SZ 7 (GLOVE) IMPLANT
GLOVE SURG SS PI 7.0 STRL IVOR (GLOVE) IMPLANT
GLOVE SURG SS PI 7.5 STRL IVOR (GLOVE) IMPLANT
GOWN STRL REUS W/ TWL LRG LVL3 (GOWN DISPOSABLE) ×8 IMPLANT
GOWN STRL REUS W/ TWL XL LVL3 (GOWN DISPOSABLE) ×4 IMPLANT
GOWN STRL REUS W/TWL LRG LVL3 (GOWN DISPOSABLE) ×16
GOWN STRL REUS W/TWL XL LVL3 (GOWN DISPOSABLE) ×2
HEMOSTAT POWDER SURGIFOAM 1G (HEMOSTASIS) ×8 IMPLANT
HEMOSTAT SURGICEL 2X14 (HEMOSTASIS) IMPLANT
INSERT FOGARTY XLG (MISCELLANEOUS) IMPLANT
KIT BASIN OR (CUSTOM PROCEDURE TRAY) ×2 IMPLANT
KIT LVAD HEARTMATE 3 W-CNTRL (Prosthesis & Implant Heart) IMPLANT
KIT SUCTION CATH 14FR (SUCTIONS) ×2 IMPLANT
KIT TURNOVER KIT B (KITS) ×2 IMPLANT
LEAD PACING MYOCARDI (MISCELLANEOUS) IMPLANT
LINE VENT (MISCELLANEOUS) IMPLANT
NS IRRIG 1000ML POUR BTL (IV SOLUTION) ×10 IMPLANT
PACK OPEN HEART (CUSTOM PROCEDURE TRAY) ×2 IMPLANT
PAD ARMBOARD 7.5X6 YLW CONV (MISCELLANEOUS) ×4 IMPLANT
PAD DEFIB R2 (MISCELLANEOUS) ×2 IMPLANT
PENCIL BUTTON HOLSTER BLD 10FT (ELECTRODE) IMPLANT
POSITIONER HEAD DONUT 9IN (MISCELLANEOUS) ×2 IMPLANT
POWDER SURGICEL 3.0 GRAM (HEMOSTASIS) IMPLANT
PUNCH AORTIC ROTATE 4.5MM 8IN (MISCELLANEOUS) ×2 IMPLANT
SEALANT PATCH FIBRIN 2X4IN (MISCELLANEOUS) IMPLANT
SEALANT SURG COSEAL 8ML (VASCULAR PRODUCTS) ×2 IMPLANT
SET MPS 3-ND DEL (MISCELLANEOUS) IMPLANT
SHEATH AVANTI 11CM 5FR (SHEATH) IMPLANT
SPONGE T-LAP 18X18 ~~LOC~~+RFID (SPONGE) ×8 IMPLANT
SPONGE T-LAP 4X18 ~~LOC~~+RFID (SPONGE) ×2 IMPLANT
SURGIFLO W/THROMBIN 8M KIT (HEMOSTASIS) IMPLANT
SUT ETHIBOND 2 0 SH (SUTURE) ×8
SUT ETHIBOND 2 0 SH 36X2 (SUTURE) ×10 IMPLANT
SUT ETHIBOND NAB MH 2-0 36IN (SUTURE) ×24 IMPLANT
SUT PROLENE 3 0 RB 1 (SUTURE) IMPLANT
SUT PROLENE 3 0 SH DA (SUTURE) ×4 IMPLANT
SUT PROLENE 4 0 RB 1 (SUTURE) ×16
SUT PROLENE 4 0 SH DA (SUTURE) IMPLANT
SUT PROLENE 4-0 RB1 .5 CRCL 36 (SUTURE) ×8 IMPLANT
SUT PROLENE 5 0 C 1 36 (SUTURE) IMPLANT
SUT PROLENE 5 0 C1 (SUTURE) IMPLANT
SUT SILK 1 MH (SUTURE) ×8 IMPLANT
SUT SILK 1 TIES 10X30 (SUTURE) ×2 IMPLANT
SUT SILK 2 0 SH CR/8 (SUTURE) ×4 IMPLANT
SUT SILK 2 0 TIES 10X30 (SUTURE) IMPLANT
SUT STEEL 6MS V (SUTURE) ×4 IMPLANT
SUT STEEL SZ 6 DBL 3X14 BALL (SUTURE) ×2 IMPLANT
SUT TEM PAC WIRE 2 0 SH (SUTURE) IMPLANT
SUT VIC AB 1 CTX 36 (SUTURE) ×4
SUT VIC AB 1 CTX36XBRD ANBCTR (SUTURE) ×4 IMPLANT
SUT VIC AB 2-0 CT1 27 (SUTURE) ×2
SUT VIC AB 2-0 CT1 TAPERPNT 27 (SUTURE) IMPLANT
SUT VIC AB 2-0 CTX 27 (SUTURE) ×4 IMPLANT
SUT VIC AB 3-0 SH 8-18 (SUTURE) ×2 IMPLANT
SUT VIC AB 3-0 X1 27 (SUTURE) ×4 IMPLANT
SYR 50ML LL SCALE MARK (SYRINGE) ×2 IMPLANT
SYSTEM SAHARA CHEST DRAIN ATS (WOUND CARE) ×2 IMPLANT
TAPE CLOTH 4X10 WHT NS (GAUZE/BANDAGES/DRESSINGS) IMPLANT
TAPE PAPER 2X10 WHT MICROPORE (GAUZE/BANDAGES/DRESSINGS) IMPLANT
TOWEL GREEN STERILE (TOWEL DISPOSABLE) ×2 IMPLANT
TRAY CATH LUMEN 1 20CM STRL (SET/KITS/TRAYS/PACK) ×2 IMPLANT
TRAY FOLEY SLVR 16FR TEMP STAT (SET/KITS/TRAYS/PACK) ×2 IMPLANT
TUBE CONNECTING 12X1/4 (SUCTIONS) ×2 IMPLANT
TUBE SUCT INTRACARD DLP 20F (MISCELLANEOUS) IMPLANT
UNDERPAD 30X36 HEAVY ABSORB (UNDERPADS AND DIAPERS) ×2 IMPLANT
VENT LEFT HEART 12002 (CATHETERS)
WATER STERILE IRR 1000ML POUR (IV SOLUTION) ×4 IMPLANT
YANKAUER SUCT BULB TIP NO VENT (SUCTIONS) ×2 IMPLANT

## 2023-05-03 NOTE — Anesthesia Procedure Notes (Signed)
Central Venous Catheter Insertion Performed by: Atilano Median, DO, anesthesiologist Start/End11/14/2024 6:55 AM, 05/03/2023 7:10 AM Patient location: Pre-op. Preanesthetic checklist: patient identified, IV checked, site marked, risks and benefits discussed, surgical consent, monitors and equipment checked, pre-op evaluation, timeout performed and anesthesia consent Position: Trendelenburg Lidocaine 1% used for infiltration and patient sedated Hand hygiene performed  and maximum sterile barriers used  Catheter size: 9 Fr Central line was placed.MAC introducer Procedure performed using ultrasound guided technique. Ultrasound Notes:anatomy identified, needle tip was noted to be adjacent to the nerve/plexus identified, no ultrasound evidence of intravascular and/or intraneural injection and image(s) printed for medical record Attempts: 1 Following insertion, line sutured, dressing applied and Biopatch. Post procedure assessment: blood return through all ports, free fluid flow and no air  Patient tolerated the procedure well with no immediate complications.

## 2023-05-03 NOTE — Progress Notes (Signed)
RT transported pt on ventilator from OR to 2H04 without any complications. OR team at bedside.

## 2023-05-03 NOTE — Brief Op Note (Signed)
04/30/2023 - 05/03/2023  12:13 PM  PATIENT:  Joe [name]  52 y.o. male  PRE-OPERATIVE DIAGNOSIS:  Ischemic cardiomyopathy, congestive heart failure  POST-OPERATIVE DIAGNOSIS:   Ischemic cardiomyopathy, congestive heart failure  PROCEDURES:  INSERTION OF IMPLANTABLE LEFT VENTRICULAR ASSIST DEVICE - HEARTMATE 3   TRANSESOPHAGEAL ECHOCARDIOGRAM   SURGEON: Lovett Sox, MD - Primary  PHYSICIAN ASSISTANT: Laterria Lasota  ASSISTANTS: Tanda Rockers, RN         Velvet Bathe, RN, RN First Assistant   ANESTHESIA:   general  EBL:   BLOOD ADMINISTERED:  FFP x 1 units, Plts x 1 pheresis  DRAINS:  pleural and mediastinal drains x 4    LOCAL MEDICATIONS USED:  NONE  SPECIMEN:  Apical myocardial core  DISPOSITION OF SPECIMEN:  PATHOLOGY  COUNTS:  Correct  DICTATION: .Dragon Dictation  PLAN OF CARE: Admit to inpatient   PATIENT DISPOSITION:  ICU - intubated and hemodynamically stable.   Delay start of Pharmacological VTE agent (>24hrs) due to surgical blood loss or risk of bleeding: yes

## 2023-05-03 NOTE — Op Note (Signed)
NAME: Joe [name], PURK MEDICAL RECORD NO: [id number] ACCOUNT NO: [id number] DATE OF BIRTH: 14-May-1971 FACILITY: [facility] LOCATION: [facility] PHYSICIAN: Mikey Bussing, MD  Operative Report   DATE OF PROCEDURE: 05/03/2023   OPERATION:   1.  Implantation of HeartMate III left ventricular assist device.   2.  Median sternotomy for cardiopulmonary bypass.  SURGEON:  Mikey Bussing, MD  ASSISTANT:  Jillyn Hidden, PA-C. A skilled First Assist was needed for the anastomoses of the VAD Pump to the LV apex and Ascending Aorta including exposure,sutre management and handling fragile diseased cardiac tissue.  ANESTHESIA: General.  PREOPERATIVE DIAGNOSIS:  1.  Nonischemic cardiomyopathy with severe LV dysfunction. 2.  Class IV CHF.  POSTOPERATIVE DIAGNOSIS:  1.  Nonischemic cardiomyopathy with severe LV dysfunction. 2.  Class IV CHF.  PROCEDURE:  After extensive preoperative discussions with the patient and family both in the office as well as in the hospital setting, the patient was prepared for implantation of HeartMate III.  He had a recent right heart catheterization and  echocardiogram and was placed on milrinone in preparation for implantation.  The patient was brought from the cardiac progressive care unit to the OR in stable condition.  He was given sedation and had A-line and PA catheter placed.  I examined the  patient after these procedures and again reviewed the benefits and risks of the surgery with the patient as well as with his wife.  Informed consent was documented and the patient was taken back to the OR.    The patient was placed supine on the operating room table where general anesthesia was induced and the patient was intubated.  He remained stable.  A transesophageal echo probe was placed by the anesthesiologist, which confirmed the preoperative  diagnosis of severe LV dysfunction with moderate-to-severe MR.  The patient was prepped and draped in a sterile field.   He was carefully positioned due to his extreme obesity and body habitus.  A proper timeout was performed and a sternal incision was  made.  The counterincision was also made in the mid abdomen for the VAD power cord tunnel.  The sternal retractor was placed and the anterior mediastinal fat was divided down to the pericardium.  A modified pocket for the pump was created by taking down  some of the left hemidiaphragm and the left pleural tissue membrane.    The Ankeney sternal retractor was placed using the deep blades because of his obese body habitus.  The pericardium was elevated.  Pursestring were placed in the ascending aorta and right atrium and heparin was administered.  When the ACT was documented  as being therapeutic, the patient was cannulated and placed on cardiopulmonary bypass.    The heart was inspected and the LV apical dimple located.  The heart was elevated under several lap pads and an incision was made through the apical dimple and the coring knife was used to create the opening.  CO2 was insufflated in the operative field.     There was no LV thrombus noted.  The VAD 2-0 pledgeted Ethibond sutures were then placed around the opening and the LV apex numbering 12 mattress sutures.  The inflow cannula disk was then connected to the apex and all of the sutures were tied.  Next,  the patient was put in the head-down position and volume was left into the heart and circulation and the HeartMate 3 pump was brought into the operative field.  The inflow cannula was then inserted  into the apex and the locking mechanism engaged with  care being taken not to entrap any air.  The heart was then lowered back into the pericardial space and the pump placed into the pocket that had been previously created and the power cord was tunneled through the abdominal wall and exited the left upper  quadrant.    A vascular clamp was placed on the outflow graft, which was then brought around the venous cannula  and cut at the appropriate length and beveled for the end-to-side anastomosis to the ascending aorta.  A partial clamp was placed on the ascending aorta  and an arteriotomy was created.  The graft was sewn end-to-side with running 4-0 Prolene and air was noted from the graft with release of the partial clamp.  There was an area of bleeding from the toe of the graft and this was controlled with pledgeted  4-0 Prolene.    The patient was then prepared for transition to HeartMate III from cardiopulmonary bypass.  Inotropic drugs were started and the lungs were expanded and the patient was started on inhaled nitric oxide.  The pump was started at a speed of 4000 RPM still  with a clamped outflow graft and a needle to remove any entrapped air.  The patient was then weaned down to half flow on cardiopulmonary bypass and the pump speed of the VAD was increased to 4800 RPMs and the outflow clamp was removed.  The patient was  then reduced from CPB down to quarter flow and the speed on the VAD was slowly increased to 5400.  There was excellent hemodynamics during the transition to the VAD pump and echo showed the inflow cannula to be in good position, there was improvement in  the EMR and the septum remained midline.  The patient was kept on VAD at a speed of 5600 RPMs and the CPB circuit was clamped off.  The insertion sites of the inflow and outflow cannulas were inspected and found to be hemostatic; however, there was  diffuse bleeding from the sternal incision, all the pleural fat incisions, and the general dissection.  Protamine was then started and was administered without complication.  There was still diffuse bleeding.  The aortic and right atrial cannulas were  removed.  The patient was given FFP platelets and cryoprecipitate with improvement in coagulation function.  Hemodynamics remained stable on a VAD speed of 5600 RPM.    Bilateral pleural tubes were placed as well as an anterior mediastinal and a  VAD pocket drain.  The superior pericardium was closed over the aorta and outflow graft.  When hemostasis was adequate, the sternal wires were placed and the sternum was wired  and closed.  The patient remained stable.  The pectoralis fascia was closed with a running #1 Vicryl.  The subcutaneous and skin layers were closed with a running Vicryl.  The counter incision for the VAD tunnel was then closed in layers using Vicryl and  the exit site was also closed with 1 Vicryl subcutaneous stitch and a skin stitch of 3-0 nylon.  Sterile dressings were applied and the patient was transported back to the ICU with the VAD coordinator who had been present for the entire operation as  well as the anesthesia team, a ventilator, the nitric oxide supply, and the VAD monitoring equipment.    Total cardiopulmonary bypass time was 120 minutes.      SUJ D: 05/03/2023 5:49:01 pm T: 05/03/2023 10:08:00 pm  JOB: [id number]/ [id number]

## 2023-05-03 NOTE — Anesthesia Procedure Notes (Signed)
Procedure Name: Intubation Date/Time: 05/03/2023 7:50 AM  Performed by: Alease Medina, CRNAPre-anesthesia Checklist: Patient identified, Emergency Drugs available, Suction available and Patient being monitored Patient Re-evaluated:Patient Re-evaluated prior to induction Oxygen Delivery Method: Circle system utilized Preoxygenation: Pre-oxygenation with 100% oxygen Induction Type: IV induction Ventilation: Mask ventilation without difficulty Laryngoscope Size: Mac and 4 Grade View: Grade I Tube type: Oral Tube size: 8.0 mm Number of attempts: 1 Airway Equipment and Method: Stylet and Oral airway Placement Confirmation: ETT inserted through vocal cords under direct vision, positive ETCO2 and breath sounds checked- equal and bilateral Secured at: 25 cm Tube secured with: Tape Dental Injury: Teeth and Oropharynx as per pre-operative assessment

## 2023-05-03 NOTE — Anesthesia Procedure Notes (Signed)
Central Venous Catheter Insertion Performed by: Atilano Median, DO, anesthesiologist Start/End11/14/2024 7:16 AM, 05/03/2023 7:17 AM Patient location: Pre-op. Preanesthetic checklist: patient identified, IV checked, site marked, risks and benefits discussed, surgical consent, monitors and equipment checked, pre-op evaluation, timeout performed and anesthesia consent Position: supine Hand hygiene performed  and maximum sterile barriers used  PA cath was placed.Swan type:thermodilution PA Cath depth:55 Procedure performed without using ultrasound guided technique. Attempts: 1 Patient tolerated the procedure well with no immediate complications.

## 2023-05-03 NOTE — Progress Notes (Addendum)
Advanced Heart Failure VAD Team Note  PCP-Cardiologist: [name], MD   Subjective:    11/14: s/p HM3 LVAD implantation  Returned from OR. Intubated/sedated On Epi 5, NE 8, Milrinone 0.125, and iNO 20.  Swan #s: CVP 7 PAP 25/11/16 PCWP 10 CO 4.99 CI 1.91 PAPi 7  Post-op hgb 11.3  LVAD INTERROGATION:  HeartMate HM3 LVAD:   Flow 5.3 liters/min, speed 5600, Power 4.1, PI 1.7  Objective:    Vital Signs:   Temp:  [95.9 F (35.5 C)-98.5 F (36.9 C)] 95.9 F (35.5 C) (11/14 1500) Pulse Rate:  [63-81] 80 (11/14 0736) Resp:  [0-29] 0 (11/14 1500) BP: (109-116)/(69-101) 116/101 (11/14 0730) SpO2:  [84 %-98 %] 97 % (11/14 1456) Arterial Line BP: (84)/(68) 84/68 (11/14 1500) FiO2 (%):  [100 %] 100 % (11/14 1456) Weight:  [137.2 kg] 137.2 kg (11/14 0659) Last BM Date : 05/01/23  Mean arterial Pressure 76  Intake/Output:   Intake/Output Summary (Last 24 hours) at 05/03/2023 1553 Last data filed at 05/03/2023 1530 Gross per 24 hour  Intake 7143.79 ml  Output 5220 ml  Net 1923.79 ml   Physical Exam    CVP 7 General:  Intubated/sedated.  HEENT: normal Neck: supple. Carotids 2+ bilat; no bruits. No lymphadenopathy or thyromegaly appreciated. Cor: Mechanical heart sounds with LVAD hum present. MS incision  Lungs: clear, bases diminished Abdomen: taut, distended. No hepatosplenomegaly. No bruits or masses. Good bowel sounds. Driveline: C/D/I; securement device intact and driveline incorporated Extremities: no cyanosis, clubbing, rash. + edema Neuro: intubated/sedated Lines/Devices: ETT, RIJ PAC, CT x4, Foley,   Telemetry   Epicardial leads in place. Paced at 80. (Personally reviewed)  EKG    N/a  Labs   Basic Metabolic Panel: Recent Labs  Lab 05/01/23 0340 05/02/23 0450 05/03/23 0400 05/03/23 0803 05/03/23 1027 05/03/23 1101 05/03/23 1134 05/03/23 1210 05/03/23 1213 05/03/23 1239 05/03/23 1308 05/03/23 1325 05/03/23 1505  NA 136 134*  135   < > 137   < > 138 139 139 139 141 141 139  K 4.1 3.9 4.0   < > 4.0   < > 3.8 3.7 3.6 3.1* 3.0* 3.2* 3.0*  CL 99 97* 96*   < > 97*  --  97* 96*  --   --   --  99 104  CO2 28 28 27   --   --   --   --   --   --   --   --   --  24  GLUCOSE 157* 131* 124*   < > 117*  --  118* 176*  --   --   --  181* 174*  BUN 20 22* 25*   < > 23*  --  23* 27*  --   --   --  21* 20  CREATININE 1.50* 1.49* 1.60*   < > 1.40*  --  1.30* 1.30*  --   --   --  1.20 1.38*  CALCIUM 8.7* 8.9 9.0  --   --   --   --   --   --   --   --   --  7.9*  MG 2.4 2.3 2.5*  --   --   --   --   --   --   --   --   --  2.1   < > = values in this interval not displayed.    Liver Function Tests: No results for input(s): "AST", "ALT", "ALKPHOS", "BILITOT", "PROT", "  ALBUMIN" in the last 168 hours. No results for input(s): "LIPASE", "AMYLASE" in the last 168 hours. No results for input(s): "AMMONIA" in the last 168 hours.  CBC: Recent Labs  Lab 05/01/23 0817 05/02/23 0450 05/03/23 0400 05/03/23 0803 05/03/23 1137 05/03/23 1210 05/03/23 1213 05/03/23 1239 05/03/23 1308 05/03/23 1325 05/03/23 1505  WBC 5.8 6.6 6.7  --   --   --   --   --   --   --  15.3*  HGB 14.0 13.9 14.4   < > 9.6*   < > 10.9* 10.9* 11.2* 10.9* 11.3*  HCT 42.2 40.9 42.1   < > 28.4*   < > 32.0* 32.0* 33.0* 32.0* 32.6*  MCV 95.3 93.4 92.9  --   --   --   --   --   --   --  93.4  PLT 167 162 172  --  113*  --   --   --   --   --  135*  135*   < > = values in this interval not displayed.    INR: Recent Labs  Lab 05/02/23 1909 05/03/23 1505  INR 1.8* 1.4*    Other results: EKG:   Imaging   EP STUDY  Result Date: 05/03/2023 See surgical note for result.   Medications:    Scheduled Medications:  [START ON 05/04/2023] acetaminophen  1,000 mg Oral Q6H   Or   [START ON 05/04/2023] acetaminophen (TYLENOL) oral liquid 160 mg/5 mL  1,000 mg Per Tube Q6H   acetaminophen (TYLENOL) oral liquid 160 mg/5 mL  650 mg Per Tube Once   Or    acetaminophen  650 mg Rectal Once   allopurinol  100 mg Oral Daily   [START ON 05/04/2023] aspirin EC  325 mg Oral Daily   Or   [START ON 05/04/2023] aspirin  324 mg Per Tube Daily   Or   [START ON 05/04/2023] aspirin  300 mg Rectal Daily   aspirin EC  81 mg Oral Daily   [START ON 05/04/2023] bisacodyl  10 mg Oral Daily   Or   [START ON 05/04/2023] bisacodyl  10 mg Rectal Daily   chlorhexidine  15 mL Mouth/Throat NOW   Chlorhexidine Gluconate Cloth  6 each Topical Daily   feeding supplement  237 mL Oral BID BM   levothyroxine  25 mcg Oral Daily   potassium chloride SA  40 mEq Oral TID   sodium chloride flush  10-40 mL Intracatheter Q12H   sodium chloride flush  3 mL Intravenous Q12H   sodium chloride flush  3 mL Intravenous Q12H   [START ON 05/04/2023] sodium chloride flush  3 mL Intravenous Q12H   [START ON 05/04/2023] sodium chloride flush  3 mL Intravenous Q8H    Infusions:  sodium chloride 20 mL/hr at 05/03/23 1530   [START ON 05/04/2023] sodium chloride     sodium chloride Stopped (05/03/23 1513)   albumin human 12.5 g (05/03/23 1500)    ceFAZolin (ANCEF) IV     dexmedetomidine (PRECEDEX) IV infusion 0.7 mcg/kg/hr (05/03/23 1530)   epinephrine 5 mcg/min (05/03/23 1530)   famotidine (PEPCID) IV     [START ON 05/04/2023] fluconazole (DIFLUCAN) IV     insulin 6.5 Units/hr (05/03/23 1530)   lactated ringers     lactated ringers 20 mL/hr at 05/03/23 1530   magnesium sulfate 20 mL/hr at 05/03/23 1530   milrinone 0.125 mcg/kg/min (05/03/23 1530)   norepinephrine (LEVOPHED) Adult infusion 8 mcg/min (05/03/23 1530)  potassium chloride 50 mL/hr at 05/03/23 1530   potassium chloride     [START ON 05/04/2023] rifampin (RIFADIN) 600 mg in sodium chloride 0.9 % 100 mL IVPB     vancomycin     vasopressin      PRN Medications: acetaminophen, albumin human, dextrose, midazolam, morphine injection, ondansetron (ZOFRAN) IV, mouth rinse, oxyCODONE, polyvinyl alcohol, sodium chloride  flush, sodium chloride flush, sodium chloride flush, traMADol  Patient Profile    Joe [name] is a 52 y.o. male with h/o morbid obesity, DM2, HTN, HL, OSA, CAD and systolic HF. Now s/p HM3 LVAD Insertion 11/14 by Dt. Vantrigt.  Assessment/Plan:    1.  Chronic Systolic HF s/p HM3 Insertion 01/09 - due to iCM - Echo (8/24): EF 20-25%, RV ok. - Echo 05/01/23: EF 20%, RV moderately down (improved) - R/LHC (9/24): stable 3v CAD, elevated biventricular filling pressures with low PAPi; CI 2.2 - RHC (11/24): RA 9, PA mean 28, PCWP 19, Fick CI 1.9, TD CI 2.2, PVR 1.6, PAPi 2.4 - 11/14 HM3 Insertion by Dr. Maren Beach - Received FFP x6, Cryo x2, PLT x1, and DDAVP in OR - LVAD Set Speed 5600 at ~5.3LPM.  - Continue Epi, NE, Vaso, Milrinone for CO/RV support. Wean as able. - CVP 7, PAP 25/11, PW 10, CO/CI 4.99/1.91 - Hold GDMT in acute post op setting - Monitor CT output and Hgb for bleeding. Post-op Hgb stable 11.3. - Fast track wean and extubate, as able - Heparin bridge per CTS    2. H/o VT Arrest  Hx PVCs - Followed by Dr. Elberta Fortis - recurrent episodes - Admitted 10/10-10/13/24 w/ VF treated w/ ICD shock>>loaded w/ IV amio  - Re-admitted 04/08/23 w/ VT arrest, treated w/ appropriate ICD shock x 2 + brief bystander CPR  - in setting of underling CM w/ EF < 35% + intermittent hypokalemia  - Hold Amio 200 mg bid and mexiletine 250 mg bid - Epicardial leads in place. Pacing at 80.   3. CAD with chronic stable angina - s/p multiple stents to LAD and LCX - Cath 9/22 stable CAD - LHC (9/24) with stable 3v CAD - Hold Ranexa 500 mg bid and Crestor 40mg  daily - Continue ASA.  4. AKI  - SCr 1.5 (baseline 1.1) - Keep off SGLT2i  - Avoid hypotension - Post-op sCr 1.38 - Follow BMET   5. DM2 - A1c 7.2 (1/24)   6. OSA - Not using CPAP, says machine was recalled a while ago. Needs new machine. - Followed outpatient   7. Obesity - Body mass index is 37.62 kg/m. - Has struggled with  weight loss - Continue Ozempic outpatient, may have better success with tirzepatide.  I reviewed the LVAD parameters from today, and compared the results to the patient's prior recorded data.  No programming changes were made.  The LVAD is functioning within specified parameters.  The patient performs LVAD self-test daily.  LVAD interrogation was negative for any significant power changes, alarms or PI events/speed drops.  LVAD equipment check completed and is in good working order.  Back-up equipment present.   LVAD education done on emergency procedures and precautions and reviewed exit site care.  Length of Stay: 3  [place] Lee, NP 05/03/2023, 3:53 PM  VAD Team --- VAD ISSUES ONLY--- Pager [phone] (7am - 7am)  Advanced Heart Failure Team  Pager [phone] (M-F; 7a - 5p)  Please contact [facility] for night-coverage after hours (5p -7a ) and weekends on [email or web address]  Agree with  above.   Just back from OR after VAD placement. Remains intubated. MAPs stable on current drips.   VAD flows are good but PI running low 1.7-1.9. Making urine.   AV paced  General: Intubated sedated HEENT: normal  + ETT Neck: supple. RIJ swan Cor: LVAD hum. + sternal dressing. CTs  Lungs: Clear. Abdomen: obese soft, nontender, non-distended. Hypoactive bowel sounds. Driveline site clean. Anchor in place.  Extremities: no cyanosis, clubbing, rash. Warm 2+  edema  Neuro: sedated  Stable immediately post-op VAD. Continue current drips.  PI low. Continue to give albumin and transfuse for hgb < 8.0.  Has significant volume overload. Will need diuresis over time.   Keep intubated overnight.   D/w Dr. Donata Clay.   VAD interrogated personally. Parameters stable.  CRITICAL CARE Performed by: Arvilla Meres  Total critical care time: 45 minutes  Critical care time was exclusive of separately billable procedures and treating other patients.  Critical care was necessary to treat or prevent  imminent or life-threatening deterioration.  Critical care was time spent personally by me (independent of midlevel providers or residents) on the following activities: development of treatment plan with patient and/or surrogate as well as nursing, discussions with consultants, evaluation of patient's response to treatment, examination of patient, obtaining history from patient or surrogate, ordering and performing treatments and interventions, ordering and review of laboratory studies, ordering and review of radiographic studies, pulse oximetry and re-evaluation of patient's condition.  Arvilla Meres, MD  6:01 PM

## 2023-05-03 NOTE — Progress Notes (Signed)
Day of Surgery Procedure(s) (LRB): INSERTION OF IMPLANTABLE LEFT VENTRICULAR ASSIST DEVICE (N/A) TRANSESOPHAGEAL ECHOCARDIOGRAM (N/A) Subjective: Patient examined. Images of CT chest reviewed Responds on vent Nsr RUE swelling, warm with good grip P6 Impella 5.5 Objective: Vital signs in last 24 hours: Temp:  [97.6 F (36.4 C)-98.5 F (36.9 C)] 97.9 F (36.6 C) (11/14 0356) Pulse Rate:  [60-81] 80 (11/14 0736) Cardiac Rhythm: Ventricular paced (11/13 1945) Resp:  [13-29] 16 (11/14 0736) BP: (99-116)/(69-101) 116/101 (11/14 0730) SpO2:  [84 %-98 %] 96 % (11/14 0736) Weight:  [137.2 kg] 137.2 kg (11/14 0659)  Hemodynamic parameters for last 24 hours: PAP: (26-29)/(15-19) 29/18 CVP:  [4 mmHg-7 mmHg] 7 mmHg  Intake/Output from previous day: 11/13 0701 - 11/14 0700 In: 1107.3 [P.O.:600; I.V.:207.3; IV Piggyback:300] Out: 1700 [Urine:1700] Intake/Output this shift: No intake/output data recorded.       Exam    General- alert on vent    Neck- no JVD, no cervical adenopathy palpable, no carotid bruit   Lungs- clear without rales, wheezes, R chest wall hematoma   Cor- regular rate and rhythm, no murmur , gallop   Abdomen- soft, non-tender   Extremities - warm, non-tender, minimal edema   Neuro- moves all exremities    Lab Results: Recent Labs    05/02/23 0450 05/03/23 0400  WBC 6.6 6.7  HGB 13.9 14.4  HCT 40.9 42.1  PLT 162 172   BMET:  Recent Labs    05/02/23 0450 05/03/23 0400  NA 134* 135  K 3.9 4.0  CL 97* 96*  CO2 28 27  GLUCOSE 131* 124*  BUN 22* 25*  CREATININE 1.49* 1.60*  CALCIUM 8.9 9.0    PT/INR:  Recent Labs    05/02/23 1909  LABPROT 20.8*  INR 1.8*   ABG    Component Value Date/Time   PHART 7.406 02/23/2023 1707   HCO3 30.1 (H) 04/30/2023 1136   HCO3 26.3 04/30/2023 1136   TCO2 32 04/30/2023 1136   TCO2 28 04/30/2023 1136   O2SAT 59.2 05/03/2023 0400   CBG (last 3)  Recent Labs    04/30/23 0928 04/30/23 1158  GLUCAP 148*  122*    Assessment/Plan: S/P Procedure(s) (LRB): INSERTION OF IMPLANTABLE LEFT VENTRICULAR ASSIST DEVICE (N/A) TRANSESOPHAGEAL ECHOCARDIOGRAM (N/A) Continue with Vent wean, extubation then Impella wean Doppler U/S R arm for poss DVT Repeat 2D echo    LOS: 3 days    Lovett Sox 05/03/2023

## 2023-05-03 NOTE — Progress Notes (Signed)
Floor RN made aware of need for Covid test prior to surgery today. Order placed in EPIC. Floor RN to collect sample and send to lab at this time

## 2023-05-03 NOTE — Transfer of Care (Signed)
Immediate Anesthesia Transfer of Care Note  Patient: Joe [name]  Procedure(s) Performed: INSERTION OF IMPLANTABLE LEFT VENTRICULAR ASSIST DEVICE - HEARTMATE 3 (Chest) TRANSESOPHAGEAL ECHOCARDIOGRAM  Patient Location: ICU  Anesthesia Type:General  Level of Consciousness: sedated and Patient remains intubated per anesthesia plan  Airway & Oxygen Therapy: Patient remains intubated per anesthesia plan and Patient placed on Ventilator (see vital sign flow sheet for setting)  Post-op Assessment: Report given to RN and Post -op Vital signs reviewed and stable  Post vital signs: Reviewed and stable  Last Vitals:  See ICU flowsheet Vitals shown include unfiled device data.  Last Pain:  Vitals:   05/03/23 0659  TempSrc:   PainSc: 0-No pain      Patients Stated Pain Goal: 0 (05/03/23 0659)  Complications: No notable events documented.  Patient transported to ICU with standard monitors (HR, BP, SPO2, RR) and emergency drugs/equipment. Controlled ventilation maintained via RT ventilator with Nitric infusing. Report given to bedside RN and respiratory therapist. Pt connected to ICU monitor. All questions answered and vital signs stable before leaving. Dr. Nance Pew present for transport

## 2023-05-03 NOTE — Progress Notes (Signed)
Pre Procedure note for inpatients:   Joe [name] has been scheduled for Procedure(s): INSERTION OF IMPLANTABLE LEFT VENTRICULAR ASSIST DEVICE (N/A) TRANSESOPHAGEAL ECHOCARDIOGRAM (N/A) today. The various methods of treatment have been discussed with the patient. After consideration of the risks, benefits and treatment options the patient has consented to the planned procedure.   The patient has been seen and labs reviewed. There are no changes in the patient's condition to prevent proceeding with the planned procedure today.  Recent labs:  Lab Results  Component Value Date   WBC 6.7 05/03/2023   HGB 14.4 05/03/2023   HCT 42.1 05/03/2023   PLT 172 05/03/2023   GLUCOSE 124 (H) 05/03/2023   CHOL 172 11/28/2021   TRIG 244 (H) 11/28/2021   HDL 27 (L) 11/28/2021   LDLCALC 96 11/28/2021   ALT 25 04/09/2023   AST 24 04/09/2023   NA 135 05/03/2023   K 4.0 05/03/2023   CL 96 (L) 05/03/2023   CREATININE 1.60 (H) 05/03/2023   BUN 25 (H) 05/03/2023   CO2 27 05/03/2023   TSH 5.393 (H) 04/10/2023   INR 1.8 (H) 05/02/2023   HGBA1C 7.5 (H) 03/28/2023    Lovett Sox, MD 05/03/2023 7:24 AM

## 2023-05-03 NOTE — Progress Notes (Signed)
H&V Care Navigation CSW Progress Note  Outpatient Heart Failure CSW met with pt wife and other family members in waiting room to check in.  They are in good spirits and feeling positive about the surgery- they expressed appreciation for the teams efforts to keep them updated on the patients progress.  They express no needs at this time.  CSW will continue to follow patient and check in with family during implantation stay and assist/support in whatever way is needed.   SDOH Screenings   Food Insecurity: No Food Insecurity (04/30/2023)  Housing: Low Risk  (04/30/2023)  Transportation Needs: No Transportation Needs (04/30/2023)  Utilities: Not At Risk (04/30/2023)  Financial Resource Strain: Low Risk  (04/20/2023)  Social Connections: Unknown (10/28/2021)   Received from [facility], [facility]  Tobacco Use: Low Risk  (05/03/2023)   Burna Sis, LCSW Clinical Social Worker [facility] Desk#: [phone] Cell#: [phone]

## 2023-05-03 NOTE — Plan of Care (Signed)
  Problem: Education: Goal: Understanding of CV disease, CV risk reduction, and recovery process will improve Outcome: Progressing   Problem: Activity: Goal: Ability to return to baseline activity level will improve Outcome: Progressing   Problem: Cardiovascular: Goal: Ability to achieve and maintain adequate cardiovascular perfusion will improve Outcome: Progressing Goal: Vascular access site(s) Level 0-1 will be maintained Outcome: Progressing   Problem: Education: Goal: Knowledge of General Education information will improve Description: Including pain rating scale, medication(s)/side effects and non-pharmacologic comfort measures Outcome: Progressing   Problem: Clinical Measurements: Goal: Ability to maintain clinical measurements within normal limits will improve Outcome: Progressing

## 2023-05-04 ENCOUNTER — Inpatient Hospital Stay (HOSPITAL_COMMUNITY): Payer: 59

## 2023-05-04 ENCOUNTER — Encounter (HOSPITAL_COMMUNITY): Payer: Self-pay | Admitting: Cardiothoracic Surgery

## 2023-05-04 DIAGNOSIS — I255 Ischemic cardiomyopathy: Secondary | ICD-10-CM | POA: Diagnosis not present

## 2023-05-04 DIAGNOSIS — I509 Heart failure, unspecified: Secondary | ICD-10-CM | POA: Diagnosis not present

## 2023-05-04 DIAGNOSIS — I5023 Acute on chronic systolic (congestive) heart failure: Secondary | ICD-10-CM | POA: Diagnosis not present

## 2023-05-04 DIAGNOSIS — Z95811 Presence of heart assist device: Secondary | ICD-10-CM | POA: Diagnosis not present

## 2023-05-04 LAB — PREPARE PLATELET PHERESIS: Unit division: 0

## 2023-05-04 LAB — BPAM FFP
Blood Product Expiration Date: 202411172359
Blood Product Expiration Date: 202411172359
Blood Product Expiration Date: 202411172359
Blood Product Expiration Date: 202411172359
Blood Product Expiration Date: 202411182359
Blood Product Expiration Date: 202411182359
ISSUE DATE / TIME: 202411140822
ISSUE DATE / TIME: 202411140822
ISSUE DATE / TIME: 202411140822
ISSUE DATE / TIME: 202411140822
ISSUE DATE / TIME: 202411141329
ISSUE DATE / TIME: 202411141329
Unit Type and Rh: 5100
Unit Type and Rh: 5100
Unit Type and Rh: 5100
Unit Type and Rh: 9500
Unit Type and Rh: 6200
Unit Type and Rh: 6200

## 2023-05-04 LAB — CBC WITH DIFFERENTIAL/PLATELET
Abs Immature Granulocytes: 0.09 10*3/uL — ABNORMAL HIGH (ref 0.00–0.07)
Basophils Absolute: 0 10*3/uL (ref 0.0–0.1)
Basophils Relative: 0 %
Eosinophils Absolute: 0 10*3/uL (ref 0.0–0.5)
Eosinophils Relative: 0 %
HCT: 28.6 % — ABNORMAL LOW (ref 39.0–52.0)
Hemoglobin: 9.9 g/dL — ABNORMAL LOW (ref 13.0–17.0)
Immature Granulocytes: 1 %
Lymphocytes Relative: 6 %
Lymphs Abs: 0.9 10*3/uL (ref 0.7–4.0)
MCH: 32.6 pg (ref 26.0–34.0)
MCHC: 34.6 g/dL (ref 30.0–36.0)
MCV: 94.1 fL (ref 80.0–100.0)
Monocytes Absolute: 1.9 10*3/uL — ABNORMAL HIGH (ref 0.1–1.0)
Monocytes Relative: 12 %
Neutro Abs: 13.1 10*3/uL — ABNORMAL HIGH (ref 1.7–7.7)
Neutrophils Relative %: 81 %
Platelets: 163 10*3/uL (ref 150–400)
RBC: 3.04 MIL/uL — ABNORMAL LOW (ref 4.22–5.81)
RDW: 14.6 % (ref 11.5–15.5)
WBC: 16 10*3/uL — ABNORMAL HIGH (ref 4.0–10.5)
nRBC: 0 % (ref 0.0–0.2)

## 2023-05-04 LAB — POCT I-STAT 7, (LYTES, BLD GAS, ICA,H+H)
Acid-base deficit: 4 mmol/L — ABNORMAL HIGH (ref 0.0–2.0)
Acid-base deficit: 2 mmol/L (ref 0.0–2.0)
Acid-base deficit: 3 mmol/L — ABNORMAL HIGH (ref 0.0–2.0)
Acid-base deficit: 3 mmol/L — ABNORMAL HIGH (ref 0.0–2.0)
Bicarbonate: 20.9 mmol/L (ref 20.0–28.0)
Bicarbonate: 22.7 mmol/L (ref 20.0–28.0)
Bicarbonate: 22 mmol/L (ref 20.0–28.0)
Bicarbonate: 21.2 mmol/L (ref 20.0–28.0)
Calcium, Ion: 1.15 mmol/L (ref 1.15–1.40)
Calcium, Ion: 1.15 mmol/L (ref 1.15–1.40)
Calcium, Ion: 1.14 mmol/L — ABNORMAL LOW (ref 1.15–1.40)
Calcium, Ion: 1.16 mmol/L (ref 1.15–1.40)
HCT: 28 % — ABNORMAL LOW (ref 39.0–52.0)
HCT: 26 % — ABNORMAL LOW (ref 39.0–52.0)
HCT: 26 % — ABNORMAL LOW (ref 39.0–52.0)
HCT: 25 % — ABNORMAL LOW (ref 39.0–52.0)
Hemoglobin: 9.5 g/dL — ABNORMAL LOW (ref 13.0–17.0)
Hemoglobin: 8.8 g/dL — ABNORMAL LOW (ref 13.0–17.0)
Hemoglobin: 8.8 g/dL — ABNORMAL LOW (ref 13.0–17.0)
Hemoglobin: 8.5 g/dL — ABNORMAL LOW (ref 13.0–17.0)
O2 Saturation: 99 %
O2 Saturation: 97 %
O2 Saturation: 94 %
O2 Saturation: 95 %
Patient temperature: 37.8
Patient temperature: 38.3
Patient temperature: 38.3
Patient temperature: 37.6
Potassium: 4.1 mmol/L (ref 3.5–5.1)
Potassium: 4 mmol/L (ref 3.5–5.1)
Potassium: 4.2 mmol/L (ref 3.5–5.1)
Potassium: 3.9 mmol/L (ref 3.5–5.1)
Sodium: 141 mmol/L (ref 135–145)
Sodium: 139 mmol/L (ref 135–145)
Sodium: 140 mmol/L (ref 135–145)
Sodium: 138 mmol/L (ref 135–145)
TCO2: 22 mmol/L (ref 22–32)
TCO2: 24 mmol/L (ref 22–32)
TCO2: 23 mmol/L (ref 22–32)
TCO2: 22 mmol/L (ref 22–32)
pCO2 arterial: 39 mm[Hg] (ref 32–48)
pCO2 arterial: 41.2 mm[Hg] (ref 32–48)
pCO2 arterial: 38.8 mm[Hg] (ref 32–48)
pCO2 arterial: 36 mm[Hg] (ref 32–48)
pH, Arterial: 7.341 — ABNORMAL LOW (ref 7.35–7.45)
pH, Arterial: 7.355 (ref 7.35–7.45)
pH, Arterial: 7.367 (ref 7.35–7.45)
pH, Arterial: 7.381 (ref 7.35–7.45)
pO2, Arterial: 125 mm[Hg] — ABNORMAL HIGH (ref 83–108)
pO2, Arterial: 106 mm[Hg] (ref 83–108)
pO2, Arterial: 80 mm[Hg] — ABNORMAL LOW (ref 83–108)
pO2, Arterial: 81 mm[Hg] — ABNORMAL LOW (ref 83–108)

## 2023-05-04 LAB — GLUCOSE, CAPILLARY
Glucose-Capillary: 119 mg/dL — ABNORMAL HIGH (ref 70–99)
Glucose-Capillary: 123 mg/dL — ABNORMAL HIGH (ref 70–99)
Glucose-Capillary: 136 mg/dL — ABNORMAL HIGH (ref 70–99)
Glucose-Capillary: 138 mg/dL — ABNORMAL HIGH (ref 70–99)
Glucose-Capillary: 139 mg/dL — ABNORMAL HIGH (ref 70–99)
Glucose-Capillary: 141 mg/dL — ABNORMAL HIGH (ref 70–99)
Glucose-Capillary: 145 mg/dL — ABNORMAL HIGH (ref 70–99)
Glucose-Capillary: 148 mg/dL — ABNORMAL HIGH (ref 70–99)
Glucose-Capillary: 150 mg/dL — ABNORMAL HIGH (ref 70–99)
Glucose-Capillary: 151 mg/dL — ABNORMAL HIGH (ref 70–99)
Glucose-Capillary: 155 mg/dL — ABNORMAL HIGH (ref 70–99)
Glucose-Capillary: 159 mg/dL — ABNORMAL HIGH (ref 70–99)
Glucose-Capillary: 161 mg/dL — ABNORMAL HIGH (ref 70–99)
Glucose-Capillary: 161 mg/dL — ABNORMAL HIGH (ref 70–99)
Glucose-Capillary: 164 mg/dL — ABNORMAL HIGH (ref 70–99)
Glucose-Capillary: 165 mg/dL — ABNORMAL HIGH (ref 70–99)
Glucose-Capillary: 174 mg/dL — ABNORMAL HIGH (ref 70–99)
Glucose-Capillary: 178 mg/dL — ABNORMAL HIGH (ref 70–99)
Glucose-Capillary: 197 mg/dL — ABNORMAL HIGH (ref 70–99)

## 2023-05-04 LAB — BPAM CRYOPRECIPITATE
Blood Product Expiration Date: 202411182359
Blood Product Expiration Date: 202411192359
ISSUE DATE / TIME: 202411141242
ISSUE DATE / TIME: 202411141259
Unit Type and Rh: 5100
Unit Type and Rh: 6200

## 2023-05-04 LAB — PREPARE FRESH FROZEN PLASMA
Unit division: 0
Unit division: 0
Unit division: 0
Unit division: 0

## 2023-05-04 LAB — COMPREHENSIVE METABOLIC PANEL
ALT: 17 U/L (ref 0–44)
AST: 63 U/L — ABNORMAL HIGH (ref 15–41)
Albumin: 3.4 g/dL — ABNORMAL LOW (ref 3.5–5.0)
Alkaline Phosphatase: 45 U/L (ref 38–126)
Anion gap: 11 (ref 5–15)
BUN: 16 mg/dL (ref 6–20)
CO2: 20 mmol/L — ABNORMAL LOW (ref 22–32)
Calcium: 8.2 mg/dL — ABNORMAL LOW (ref 8.9–10.3)
Chloride: 105 mmol/L (ref 98–111)
Creatinine, Ser: 1.42 mg/dL — ABNORMAL HIGH (ref 0.61–1.24)
GFR, Estimated: 59 mL/min — ABNORMAL LOW (ref >60)
Glucose, Bld: 153 mg/dL — ABNORMAL HIGH (ref 70–99)
Potassium: 4.1 mmol/L (ref 3.5–5.1)
Sodium: 136 mmol/L (ref 135–145)
Total Bilirubin: 2.1 mg/dL — ABNORMAL HIGH (ref <1.2)
Total Protein: 5.8 g/dL — ABNORMAL LOW (ref 6.5–8.1)

## 2023-05-04 LAB — CBC
HCT: 25.5 % — ABNORMAL LOW (ref 39.0–52.0)
Hemoglobin: 8.5 g/dL — ABNORMAL LOW (ref 13.0–17.0)
MCH: 31.6 pg (ref 26.0–34.0)
MCHC: 33.3 g/dL (ref 30.0–36.0)
MCV: 94.8 fL (ref 80.0–100.0)
Platelets: 150 10*3/uL (ref 150–400)
RBC: 2.69 MIL/uL — ABNORMAL LOW (ref 4.22–5.81)
RDW: 15.4 % (ref 11.5–15.5)
WBC: 17.4 10*3/uL — ABNORMAL HIGH (ref 4.0–10.5)
nRBC: 0 % (ref 0.0–0.2)

## 2023-05-04 LAB — PREPARE CRYOPRECIPITATE
Unit division: 0
Unit division: 0

## 2023-05-04 LAB — PROTIME-INR
INR: 1.3 — ABNORMAL HIGH (ref 0.8–1.2)
Prothrombin Time: 16.3 s — ABNORMAL HIGH (ref 11.4–15.2)

## 2023-05-04 LAB — BPAM PLATELET PHERESIS
Blood Product Expiration Date: 202411162359
ISSUE DATE / TIME: 202411141158
Unit Type and Rh: 6200

## 2023-05-04 LAB — BASIC METABOLIC PANEL
Anion gap: 10 (ref 5–15)
BUN: 13 mg/dL (ref 6–20)
CO2: 22 mmol/L (ref 22–32)
Calcium: 8.4 mg/dL — ABNORMAL LOW (ref 8.9–10.3)
Chloride: 105 mmol/L (ref 98–111)
Creatinine, Ser: 1.47 mg/dL — ABNORMAL HIGH (ref 0.61–1.24)
GFR, Estimated: 57 mL/min — ABNORMAL LOW (ref >60)
Glucose, Bld: 166 mg/dL — ABNORMAL HIGH (ref 70–99)
Potassium: 3.9 mmol/L (ref 3.5–5.1)
Sodium: 137 mmol/L (ref 135–145)

## 2023-05-04 LAB — CALCIUM, IONIZED: Calcium, Ionized, Serum: 4.3 mg/dL — ABNORMAL LOW (ref 4.5–5.6)

## 2023-05-04 LAB — COOXEMETRY PANEL
Carboxyhemoglobin: 1.2 % (ref 0.5–1.5)
Methemoglobin: <0.7 % (ref 0.0–1.5)
O2 Saturation: 65.7 %
Total hemoglobin: 10.1 g/dL — ABNORMAL LOW (ref 12.0–16.0)

## 2023-05-04 LAB — LACTATE DEHYDROGENASE: LDH: 275 U/L — ABNORMAL HIGH (ref 98–192)

## 2023-05-04 LAB — APTT: aPTT: 30 s (ref 24–36)

## 2023-05-04 LAB — BRAIN NATRIURETIC PEPTIDE: B Natriuretic Peptide: 226.6 pg/mL — ABNORMAL HIGH (ref 0.0–100.0)

## 2023-05-04 LAB — MAGNESIUM: Magnesium: 2.3 mg/dL (ref 1.7–2.4)

## 2023-05-04 LAB — PHOSPHORUS: Phosphorus: 3.7 mg/dL (ref 2.5–4.6)

## 2023-05-04 MED ORDER — ALBUMIN HUMAN 5 % IV SOLN
250.0000 mL | INTRAVENOUS | Status: AC | PRN
Start: 2023-05-04 — End: 2023-05-05
  Administered 2023-05-04: 12.5 g via INTRAVENOUS

## 2023-05-04 MED ORDER — POTASSIUM CHLORIDE 20 MEQ PO PACK: 20.0000 meq | PACK | Freq: Once | ORAL | Status: DC

## 2023-05-04 MED ORDER — ROSUVASTATIN CALCIUM 20 MG PO TABS
40.0000 mg | ORAL_TABLET | Freq: Every day | ORAL | Status: DC
  Administered 2023-05-05 – 2023-05-21 (×17): 40 mg via ORAL
  Filled 2023-05-04 (×17): qty 2

## 2023-05-04 MED ORDER — INSULIN DETEMIR 100 UNIT/ML ~~LOC~~ SOLN
25.0000 [IU] | Freq: Two times a day (BID) | SUBCUTANEOUS | Status: DC
  Administered 2023-05-04: 25 [IU] via SUBCUTANEOUS
  Filled 2023-05-04 (×3): qty 0.25

## 2023-05-04 MED ORDER — LEVOTHYROXINE SODIUM 25 MCG PO TABS
25.0000 ug | ORAL_TABLET | Freq: Every day | ORAL | Status: DC
  Administered 2023-05-04 – 2023-05-21 (×18): 25 ug via ORAL
  Filled 2023-05-04 (×18): qty 1

## 2023-05-04 MED ORDER — INSULIN ASPART 100 UNIT/ML IJ SOLN
0.0000 [IU] | INTRAMUSCULAR | Status: DC
Start: 2023-05-04 — End: 2023-05-07
  Administered 2023-05-04: 8 [IU] via SUBCUTANEOUS
  Administered 2023-05-04: 4 [IU] via SUBCUTANEOUS
  Administered 2023-05-05 (×3): 8 [IU] via SUBCUTANEOUS
  Administered 2023-05-05 (×2): 4 [IU] via SUBCUTANEOUS
  Administered 2023-05-05: 8 [IU] via SUBCUTANEOUS
  Administered 2023-05-06: 4 [IU] via SUBCUTANEOUS
  Administered 2023-05-06: 8 [IU] via SUBCUTANEOUS
  Administered 2023-05-06: 4 [IU] via SUBCUTANEOUS
  Administered 2023-05-06: 2 [IU] via SUBCUTANEOUS
  Administered 2023-05-06: 4 [IU] via SUBCUTANEOUS
  Administered 2023-05-06 – 2023-05-07 (×2): 8 [IU] via SUBCUTANEOUS
  Administered 2023-05-07: 2 [IU] via SUBCUTANEOUS

## 2023-05-04 MED ORDER — GABAPENTIN 300 MG PO CAPS
300.0000 mg | ORAL_CAPSULE | Freq: Three times a day (TID) | ORAL | Status: DC
  Administered 2023-05-04 – 2023-05-06 (×6): 300 mg via ORAL
  Filled 2023-05-04 (×5): qty 1
  Filled 2023-05-04: qty 3

## 2023-05-04 MED ORDER — ALBUMIN HUMAN 25 % IV SOLN
12.5000 g | INTRAVENOUS | Status: AC
  Administered 2023-05-04 (×2): 12.5 g via INTRAVENOUS
  Filled 2023-05-04 (×2): qty 50

## 2023-05-04 MED ORDER — EZETIMIBE 10 MG PO TABS
10.0000 mg | ORAL_TABLET | Freq: Every day | ORAL | Status: DC
  Administered 2023-05-04 – 2023-05-21 (×18): 10 mg via ORAL
  Filled 2023-05-04 (×18): qty 1

## 2023-05-04 MED ORDER — LACTATED RINGERS IV SOLN: INTRAVENOUS | Status: AC

## 2023-05-04 MED ORDER — AMIODARONE HCL 200 MG PO TABS
200.0000 mg | ORAL_TABLET | Freq: Two times a day (BID) | ORAL | Status: DC
  Administered 2023-05-04 – 2023-05-21 (×35): 200 mg via ORAL
  Filled 2023-05-04 (×36): qty 1

## 2023-05-04 MED ORDER — ALLOPURINOL 100 MG PO TABS
100.0000 mg | ORAL_TABLET | Freq: Every day | ORAL | Status: DC
  Administered 2023-05-04 – 2023-05-21 (×18): 100 mg via ORAL
  Filled 2023-05-04 (×18): qty 1

## 2023-05-04 MED ORDER — POTASSIUM CHLORIDE CRYS ER 20 MEQ PO TBCR: 40.0000 meq | EXTENDED_RELEASE_TABLET | Freq: Once | ORAL | Status: DC

## 2023-05-04 MED ORDER — ORAL CARE MOUTH RINSE
15.0000 mL | OROMUCOSAL | Status: DC
  Administered 2023-05-04 – 2023-05-06 (×9): 15 mL via OROMUCOSAL

## 2023-05-04 MED ORDER — ESCITALOPRAM OXALATE 10 MG PO TABS
10.0000 mg | ORAL_TABLET | Freq: Every day | ORAL | Status: DC
  Administered 2023-05-04 – 2023-05-20 (×17): 10 mg via ORAL
  Filled 2023-05-04 (×17): qty 1

## 2023-05-04 MED ORDER — MEXILETINE HCL 250 MG PO CAPS
250.0000 mg | ORAL_CAPSULE | Freq: Two times a day (BID) | ORAL | Status: DC
  Administered 2023-05-04 – 2023-05-21 (×35): 250 mg via ORAL
  Filled 2023-05-04 (×36): qty 1

## 2023-05-04 MED ORDER — INSULIN ASPART 100 UNIT/ML IJ SOLN
6.0000 [IU] | Freq: Three times a day (TID) | INTRAMUSCULAR | Status: DC
  Administered 2023-05-05 – 2023-05-09 (×13): 6 [IU] via SUBCUTANEOUS

## 2023-05-04 MED ORDER — ALLOPURINOL 100 MG PO TABS: 100.0000 mg | ORAL_TABLET | Freq: Every day | ORAL | Status: DC

## 2023-05-04 MED ORDER — LEVOTHYROXINE SODIUM 25 MCG PO TABS: 25.0000 ug | ORAL_TABLET | Freq: Every day | ORAL | Status: DC

## 2023-05-04 NOTE — Plan of Care (Signed)
  Problem: Cardiovascular: Goal: Ability to achieve and maintain adequate cardiovascular perfusion will improve Outcome: Progressing Goal: Vascular access site(s) Level 0-1 will be maintained Outcome: Progressing   Problem: Clinical Measurements: Goal: Ability to maintain clinical measurements within normal limits will improve Outcome: Progressing Goal: Will remain free from infection Outcome: Progressing Goal: Diagnostic test results will improve Outcome: Progressing   Problem: Activity: Goal: Risk for activity intolerance will decrease Outcome: Progressing   Problem: Coping: Goal: Level of anxiety will decrease Outcome: Progressing   Problem: Elimination: Goal: Will not experience complications related to bowel motility Outcome: Progressing Goal: Will not experience complications related to urinary retention Outcome: Progressing   Problem: Pain Management: Goal: General experience of comfort will improve Outcome: Progressing   Problem: Safety: Goal: Ability to remain free from injury will improve Outcome: Progressing   Problem: Skin Integrity: Goal: Risk for impaired skin integrity will decrease Outcome: Progressing   Problem: Education: Goal: Understanding of CV disease, CV risk reduction, and recovery process will improve Outcome: Not Applicable Goal: Individualized Educational Video(s) Outcome: Not Applicable   Problem: Activity: Goal: Ability to return to baseline activity level will improve Outcome: Not Applicable   Problem: Health Behavior/Discharge Planning: Goal: Ability to safely manage health-related needs after discharge will improve Outcome: Not Applicable   Problem: Education: Goal: Knowledge of General Education information will improve Description: Including pain rating scale, medication(s)/side effects and non-pharmacologic comfort measures Outcome: Not Applicable   Problem: Health Behavior/Discharge Planning: Goal: Ability to manage  health-related needs will improve Outcome: Not Applicable   Problem: Nutrition: Goal: Adequate nutrition will be maintained Outcome: Not Applicable  Pt intubated & sedated. Will reassess when appropriate

## 2023-05-04 NOTE — Anesthesia Postprocedure Evaluation (Signed)
Anesthesia Post Note  Patient: Joe [name]  Procedure(s) Performed: INSERTION OF IMPLANTABLE LEFT VENTRICULAR ASSIST DEVICE - HEARTMATE 3 (Chest) TRANSESOPHAGEAL ECHOCARDIOGRAM     Patient location during evaluation: SICU Anesthesia Type: General Level of consciousness: sedated Pain management: pain level controlled Vital Signs Assessment: post-procedure vital signs reviewed and stable Respiratory status: patient remains intubated per anesthesia plan Cardiovascular status: stable Postop Assessment: no apparent nausea or vomiting Anesthetic complications: no   No notable events documented.  Last Vitals:  Vitals:   05/04/23 0700 05/04/23 0800  BP:  (!) 87/54  Pulse: 80 (!) 177  Resp: 19 16  Temp: (!) 38 C (!) 38.1 C  SpO2: 99% 99%    Last Pain:  Vitals:   05/04/23 0800  TempSrc: Core  PainSc:                  Nelle Don Levern Kalka

## 2023-05-04 NOTE — Procedures (Signed)
Extubation Procedure Note  Patient Details:   Name: Joe [name] DOB: 11/18/1970 MRN: [id number]   Airway Documentation:    Vent end date: 05/04/23 Vent end time: 0953   Evaluation  O2 sats: stable throughout Complications: No apparent complications Patient did tolerate procedure well. Bilateral Breath Sounds: Clear, Diminished   Yes  Pt extubated per rapid wean protocol to 4L [place] with 3PPM nitric bled in per MD. Pt pass all parameters with a VC 1.4L, NIF -22. Pt had a positive cuff leak, good cough, able to state name and no stridor noted.   Kerri Perches 05/04/2023, 9:56 AM

## 2023-05-04 NOTE — Progress Notes (Addendum)
Advanced Heart Failure VAD Team Note  PCP-Cardiologist: [name], MD   Subjective:    11/14: s/p HM3 LVAD implantation  Post op day #1  Received multiple albumin overnight for low volume. Several PI events. No low flows.  Currently on 7 Epi, 11 NE, 0.125 milrinone, weaning iNO (currently at 3).   SWAN #s: CVP 4-5 CO 5.53 CI 2.1 PA 21/10 CO-OX 66%  Awake on vent and following commands. Hoping to extubate this am.   LVAD INTERROGATION:  HeartMate HM3 LVAD:   Flow 5.1 liters/min, speed 5600, Power 4.2, PI 3.3. 2 PI events so far this am. No alarms.  Objective:    Vital Signs:   Temp:  [95.9 F (35.5 C)-100.4 F (38 C)] 100.4 F (38 C) (11/15 0700) Pulse Rate:  [79-175] 80 (11/15 0700) Resp:  [0-27] 19 (11/15 0700) BP: (80-98)/(65-83) 98/83 (11/15 0400) SpO2:  [86 %-100 %] 99 % (11/15 0700) Arterial Line BP: (74-93)/(62-75) 76/65 (11/15 0700) FiO2 (%):  [40 %-100 %] 40 % (11/15 0730) Weight:  [143.5 kg] 143.5 kg (11/15 0500) Last BM Date : 05/01/23  Mean arterial Pressure 70s  Intake/Output:   Intake/Output Summary (Last 24 hours) at 05/04/2023 0805 Last data filed at 05/04/2023 0700 Gross per 24 hour  Intake 10942.01 ml  Output 7138 ml  Net 3804.01 ml   Physical Exam    CVP 4-5 Physical Exam: GENERAL: Awake and following commands. HEENT: + ETT NECK: Supple, R internal jugular SWAN CARDIAC:  Mechanical heart sounds with LVAD hum present.  LUNGS:  Clear to auscultation bilaterally.  ABDOMEN:  Soft, round, nontender, positive bowel sounds x4.     LVAD exit site:   Dressing dry and intact.  No erythema or drainage.   EXTREMITIES:  Warm and dry, no cyanosis, clubbing, rash, 2+ edema NEUROLOGIC:  Awake on vent  Lines/Devices: Foley, CT X 4  Telemetry   AV paced 80  EKG    N/a  Labs   Basic Metabolic Panel: Recent Labs  Lab 05/01/23 0340 05/02/23 0450 05/03/23 0400 05/03/23 0803 05/03/23 1210 05/03/23 1213 05/03/23 1325  05/03/23 1505 05/03/23 1508 05/03/23 2057 05/03/23 2103 05/04/23 0309 05/04/23 0426  NA 136 134* 135   < > 139   < > 141 139 140 137 140 136 141  K 4.1 3.9 4.0   < > 3.7   < > 3.2* 3.0* 3.0* 3.7 3.7 4.1 4.1  CL 99 97* 96*   < > 96*  --  99 104  --  105  --  105  --   CO2 28 28 27   --   --   --   --  24  --  24  --  20*  --   GLUCOSE 157* 131* 124*   < > 176*  --  181* 174*  --  164*  --  153*  --   BUN 20 22* 25*   < > 27*  --  21* 20  --  18  --  16  --   CREATININE 1.50* 1.49* 1.60*   < > 1.30*  --  1.20 1.38*  --  1.48*  --  1.42*  --   CALCIUM 8.7* 8.9 9.0  --   --   --   --  7.9*  --  8.2*  --  8.2*  --   MG 2.4 2.3 2.5*  --   --   --   --  2.1  --  2.6*  --   --   --  PHOS  --   --   --   --   --   --   --   --   --   --   --  3.7  --    < > = values in this interval not displayed.    Liver Function Tests: Recent Labs  Lab 05/04/23 0309  AST 63*  ALT 17  ALKPHOS 45  BILITOT 2.1*  PROT 5.8*  ALBUMIN 3.4*   No results for input(s): "LIPASE", "AMYLASE" in the last 168 hours. No results for input(s): "AMMONIA" in the last 168 hours.  CBC: Recent Labs  Lab 05/02/23 0450 05/03/23 0400 05/03/23 0803 05/03/23 1137 05/03/23 1210 05/03/23 1505 05/03/23 1508 05/03/23 2057 05/03/23 2103 05/04/23 0309 05/04/23 0426  WBC 6.6 6.7  --   --   --  15.3*  --  15.9*  --  16.0*  --   NEUTROABS  --   --   --   --   --   --   --   --   --  13.1*  --   HGB 13.9 14.4   < > 9.6*   < > 11.3* 11.2* 9.9* 9.9* 9.9* 9.5*  HCT 40.9 42.1   < > 28.4*   < > 32.6* 33.0* 28.8* 29.0* 28.6* 28.0*  MCV 93.4 92.9  --   --   --  93.4  --  92.6  --  94.1  --   PLT 162 172  --  113*  --  135*  135*  --  152  --  163  --    < > = values in this interval not displayed.    INR: Recent Labs  Lab 05/02/23 1909 05/03/23 1505 05/04/23 0309  INR 1.8* 1.4* 1.3*    Other results: EKG:   Imaging   DG Chest Port 1 View  Result Date: 05/03/2023 CLINICAL DATA:  Intraop assess for pneumothorax  EXAM: PORTABLE CHEST 1 VIEW COMPARISON:  04/08/2023 FINDINGS: Interval sternotomy and partially visualized LVAD. Endotracheal tube tip is about 6.9 cm superior to carina. Midline and probable right chest drainage catheters. Airspace disease at left base. Partially visualized left-sided pacing device. Right IJ catheter with tip visualized to the cavoatrial region. No discrete pneumothorax on limited view of the chest IMPRESSION: 1. Interval sternotomy with support apparatus as above. 2. Airspace disease at left base, probable atelectasis Electronically Signed   By: Jasmine Pang M.D.   On: 05/03/2023 18:39   DG Chest Port 1 View  Result Date: 05/03/2023 CLINICAL DATA:  LVAD EXAM: PORTABLE CHEST 1 VIEW COMPARISON:  04/08/2023, 05/03/2023 FINDINGS: Endotracheal tube tip is about 5.1 cm superior to the carina. Esophageal tube tip coursing towards diaphragm but poorly visible distally. Left-sided pacing device as before. Right IJ Swan-Ganz catheter tip directed to the right in the region of the main pulmonary artery. Post sternotomy changes, probable drainage catheter over the middle chest. Partially visualize LVAD at the ventricular apex. Probable drainage catheter over the right chest. Question tiny right apical pneumothorax. Airspace disease in the left mid lung and lung base. Subsegmental atelectasis right lower lung. IMPRESSION: 1. Endotracheal tube tip about 5.1 cm superior to the carina. 2. Esophageal tube tip coursing towards diaphragm but poorly visible distally. 3. Other support lines and tubes as above. Partially imaged LVAD at the left ventricular apex. 4. Question tiny right apical pneumothorax, attention on follow-up imaging. 5. Stable mild cardiomegaly. Airspace disease in the left mid lung and lung  base, probable atelectasis. No pneumothorax is seen Electronically Signed   By: Jasmine Pang M.D.   On: 05/03/2023 18:35   ECHO INTRAOPERATIVE TEE  Result Date: 05/03/2023  *INTRAOPERATIVE  TRANSESOPHAGEAL REPORT *  Patient Name:   Joe [name] [facility] Date of Exam: 05/03/2023 Medical Rec #:  [id number]              Height:       75.0 in Accession #:    [id number]             Weight:       302.5 lb Date of Birth:  09-Dec-1970               BSA:          2.62 m Patient Age:    52 years               BP:           113/76 mmHg Patient Gender: M                      HR:           71 bpm. Exam Location:  Anesthesiology Transesophogeal exam was perform intraoperatively during surgical procedure. Patient was closely monitored under general anesthesia during the entirety of examination. Indications:     I50.9* Heart failure (unspecified) Sonographer:     Irving Burton Senior RDCS Performing Phys: [id number] PET[address] VANTRIGT Diagnosing Phys: Earl Lites Stoltzfus Complications: No known complications during this procedure. POST-OP IMPRESSIONS _ Right Ventricle: The right ventricle appears unchanged from pre-bypass. _ Aorta: The aorta appears unchanged from pre-bypass. _ Left Atrial Appendage: The left atrial appendage appears unchanged from pre-bypass. _ Aortic Valve: The aortic valve appears unchanged from pre-bypass. _ Mitral Valve: The mitral valve appears unchanged from pre-bypass. _ Tricuspid Valve: The tricuspid valve appears unchanged from pre-bypass. _ Pulmonic Valve: The pulmonic valve appears unchanged from pre-bypass. _ Interatrial Septum: The interatrial septum appears unchanged from pre-bypass. _ Pericardium: The pericardium appears unchanged from pre-bypass. _ Comments: S/P HM III VAD implant. Ramped to 5600 without incident. IV septum with mild rightward bowing. AV opens intermittently. Inflow/outflow cannula velocities WNL. Emergence on NE, Epi, Milrinone as well as NO.  Left Ventricular Assist Device; Ramp Study: The aortic valve opens intermittently consistent with some native left ventricular ejection. The inflow cannula was visualized and No evidence of obstruction to flow of the inflow cannula. LVAD function  appears to be normal.  PRE-OP FINDINGS  Left Ventricle: The left ventricle ejection fraction is 20-25%. The cavity size was severely dilated. Left ventricular diffuse hypokinesis. There is no left ventricular hypertrophy. Left ventricular diastolic parameters are consistent with Grade III diastolic dysfunction (restrictive). Right Ventricle: The right ventricle has moderately reduced systolic function. The cavity was dialated. There is no increase in right ventricular wall thickness. Right ventricular systolic pressure is moderately elevated. Left Atrium: Left atrial size was dilated. No left atrial/left atrial appendage thrombus was detected. There is echo contrast seen in the left atrial cavity and left atrial appendage. Left atrial appendage velocity is normal at greater than 40 cm/s. Right Atrium: Right atrial size was dilated. Interatrial Septum: No atrial level shunt detected by color flow Doppler. There is no evidence of a patent foramen ovale. Pericardium: There is no evidence of pericardial effusion. There is no pleural effusion. Mitral Valve: The mitral valve is normal in structure. Mitral valve regurgitation is moderate to severe by color flow Doppler. The MR  jet is posteriorly-directed. There is no evidence of mitral valve vegetation. Pulmonary venous flow is blunted (decreased). There is moderate mitral valve regurgitation by PISA measuring 5.09. Moderate mitral annular dilatation. There is No evidence of mitral stenosis. 2D PISA EROA .38cm2, RV 63cc. Tricuspid Valve: The tricuspid valve was normal in structure. Tricuspid valve regurgitation is mild by color flow Doppler. No evidence of tricuspid stenosis is present. There is no evidence of tricuspid valve vegetation. Aortic Valve: The aortic valve is tricuspid Aortic valve regurgitation was not visualized by color flow Doppler. There is no stenosis of the aortic valve, with a calculated valve area of 1.86 cm. There is no evidence of aortic valve  vegetation. Pulmonic Valve: The pulmonic valve was normal in structure. Pulmonic valve regurgitation is trivial by color flow Doppler. Aorta: The aortic root and ascending aorta are normal in size and structure. Pulmonary Artery: Theone Murdoch catheter present on the right. The pulmonary artery is of normal size. Venous: The inferior vena cava is dilated in size with greater than 50% respiratory variability, suggesting right atrial pressure of 8 mmHg. Shunts: There is no evidence of an atrial septal defect. +--------------+--------++ LEFT VENTRICLE          +----------------+---------++ +--------------+--------++  Diastology                PLAX 2D                 +----------------+---------++ +--------------+--------++  LV e' lateral:  6.79 cm/s LVIDd:        7.10 cm   +----------------+---------++ +--------------+--------++  LV E/e' lateral:11.0      LVIDs:        6.40 cm   +----------------+---------++ +--------------+--------++  LV e' medial:   6.79 cm/s LVOT diam:    2.20 cm   +----------------+---------++ +--------------+--------++  LV E/e' medial: 11.0      LV SV:        55 ml     +----------------+---------++ +--------------+--------++ LV SV Index:  20.12    +--------------+--------++ LVOT Area:    3.80 cm +--------------+--------++                        +--------------+--------++ +---------------+---------+---------+ RIGHT VENTRICLE                   +---------------+---------+---------+ RV Basal diam: 5.90 cm            +---------------+---------+---------+ RV Mid diam:   5.30 cm            +---------------+---------+---------+ RV S prime:    5.76 cm/s14.4 cm/s +---------------+---------+---------+ TAPSE (M-mode):2.4 cm   2.37 cm   +---------------+---------+---------+ RVSP:          32.2 mmHg          +---------------+---------+---------+ +------------+---------+++ RIGHT ATRIUM          +------------+---------+++ RA  Pressure:8.00 mmHg +------------+---------+++  +------------------+-----------++ AORTIC VALVE                  +------------------+-----------++ AV Area (Vmax):   1.93 cm    +------------------+-----------++ AV Area (Vmean):  2.02 cm    +------------------+-----------++ AV Area (VTI):    1.86 cm    +------------------+-----------++ AV Vmax:          99.70 cm/s  +------------------+-----------++ AV Vmean:         68.500 cm/s +------------------+-----------++ AV VTI:           0.210 m     +------------------+-----------++  AV Peak Grad:     4.0 mmHg    +------------------+-----------++ AV Mean Grad:     2.0 mmHg    +------------------+-----------++ LVOT Vmax:        50.60 cm/s  +------------------+-----------++ LVOT Vmean:       36.400 cm/s +------------------+-----------++ LVOT VTI:         0.103 m     +------------------+-----------++ LVOT/AV VTI ratio:0.49        +------------------+-----------++  +--------------+-------++ AORTA                 +--------------+-------++ Ao Sinus diam:3.60 cm +--------------+-------++ Ao STJ diam:  3.2 cm  +--------------+-------++ Ao Asc diam:  3.10 cm +--------------+-------++ +--------------+----------++     +---------------+-----------++ MITRAL VALVE                 TRICUSPID VALVE            +--------------+----------++     +---------------+-----------++ MV Area (PHT):4.83 cm       TR Peak grad:  24.2 mmHg   +--------------+----------++     +---------------+-----------++ MV Peak grad: 2.3 mmHg       TR Mean grad:  13.0 mmHg   +--------------+----------++     +---------------+-----------++ MV Mean grad: 1.0 mmHg       TR Vmax:       246.00 cm/s +--------------+----------++     +---------------+-----------++ MV Vmax:      0.76 m/s       TR Vmean:      160.0 cm/s  +--------------+----------++     +---------------+-----------++ MV Vmean:     38.4 cm/s       Estimated RAP: 8.00 mmHg   +--------------+----------++     +---------------+-----------++ MV VTI:       0.13 m         RVSP:          32.2 mmHg   +--------------+----------++     +---------------+-----------++ MV PHT:       45.53 msec +--------------+----------++     +--------------+-------+ MV Decel Time:157 msec       SHUNTS                +--------------+----------++     +--------------+-------+ +----------------+-----------++  Systemic VTI: 0.10 m  MR Peak grad:   82.8 mmHg    +--------------+-------+ +----------------+-----------++  Systemic Diam:2.20 cm MR Mean grad:   50.0 mmHg    +--------------+-------+ +----------------+-----------++ MR Vmax:        455.00 cm/s +----------------+-----------++ MR Vmean:       324.0 cm/s  +----------------+-----------++ MR PISA:        5.09 cm    +----------------+-----------++ MR PISA Eff ROA:38 mm      +----------------+-----------++ MR PISA Radius: 0.90 cm     +----------------+-----------++ +--------------+----------++ MV E velocity:74.90 cm/s +--------------+----------++ MV A velocity:34.70 cm/s +--------------+----------++ MV E/A ratio: 2.16       +--------------+----------++  Hester Mates Electronically signed by Hester Mates Signature Date/Time: 05/03/2023/3:56:36 PM    Final    EP STUDY  Result Date: 05/03/2023 See surgical note for result.   Medications:    Scheduled Medications:  acetaminophen  1,000 mg Oral Q6H   Or   acetaminophen (TYLENOL) oral liquid 160 mg/5 mL  1,000 mg Per Tube Q6H   allopurinol  100 mg Oral Daily   aspirin EC  325 mg Oral Daily   Or   aspirin  324 mg Per Tube Daily   Or   aspirin  300 mg Rectal Daily  aspirin EC  81 mg Oral Daily   bisacodyl  10 mg Oral Daily   Or   bisacodyl  10 mg Rectal Daily   Chlorhexidine Gluconate Cloth  6 each Topical Daily   feeding supplement  237 mL Oral BID BM   levothyroxine  25 mcg Oral Daily   mouth  rinse  15 mL Mouth Rinse Q2H   potassium chloride  20 mEq Per Tube Once   sodium chloride flush  10-40 mL Intracatheter Q12H   sodium chloride flush  3 mL Intravenous Q12H   sodium chloride flush  3 mL Intravenous Q12H   sodium chloride flush  3 mL Intravenous Q12H   sodium chloride flush  3 mL Intravenous Q8H    Infusions:  sodium chloride Stopped (05/04/23 0521)   sodium chloride     sodium chloride Stopped (05/04/23 0546)   albumin human     albumin human      ceFAZolin (ANCEF) IV Stopped (05/04/23 0545)   dexmedetomidine (PRECEDEX) IV infusion 0.5 mcg/kg/hr (05/04/23 0700)   epinephrine 7 mcg/min (05/04/23 0700)   famotidine (PEPCID) IV Stopped (05/03/23 2148)   insulin 12 Units/hr (05/04/23 0700)   lactated ringers     lactated ringers 20 mL/hr at 05/04/23 0700   milrinone 0.125 mcg/kg/min (05/04/23 0700)   norepinephrine (LEVOPHED) Adult infusion 11 mcg/min (05/04/23 0700)   vancomycin Stopped (05/03/23 2341)   vasopressin      PRN Medications: acetaminophen, albumin human, dextrose, midazolam, morphine injection, ondansetron (ZOFRAN) IV, mouth rinse, oxyCODONE, polyvinyl alcohol, sodium chloride flush, sodium chloride flush, sodium chloride flush, traMADol  Patient Profile    Joe [name] is a 52 y.o. male with h/o morbid obesity, DM2, HTN, HL, OSA, CAD and systolic HF. Now s/p HM3 LVAD Insertion 11/14 by Dt. Vantrigt.  Assessment/Plan:    1.  Chronic Systolic HF s/p HM3 Insertion 32/20 - due to iCM - Echo (8/24): EF 20-25%, RV ok. - Echo 05/01/23: EF 20%, RV moderately down (improved) - R/LHC (9/24): stable 3v CAD, elevated biventricular filling pressures with low PAPi; CI 2.2 - RHC (11/24): RA 9, PA mean 28, PCWP 19, Fick CI 1.9, TD CI 2.2, PVR 1.6, PAPi 2.4 - S/p HM III LVAD 05/03/23 by Dr. Maren Beach - Received FFP x6, Cryo x2, PLT x1, and DDAVP in OR - LVAD Set Speed 5600  - Intra-op TEE - EF 20-25%, RV moderately reduced, IV septum with mild rightward  bowing, AV opens intermittently - Continue Epi, NE, Milrinone for CO/RV support. Wean as able. CI 2.1 and CO-OX 66%. - Received multiple albumin overnight. CVP currently 5.  - Hold GDMT in acute post op setting - Monitor CT output and Hgb. Hgb 9.9. - Heparin when okay with TCTS. Starting warfarin tonight. - Weaning to try to extubate today.   2. H/o VT Arrest  Hx PVCs - Followed by Dr. Elberta Fortis - Admitted 10/10-10/13/24 w/ VF treated w/ ICD shock>>loaded w/ IV amio  - Re-admitted 04/08/23 w/ VT arrest, treated w/ appropriate ICD shock x 2 + brief bystander CPR  - in setting of underling CM w/ EF < 35% + intermittent hypokalemia  - Add back home Amio 200 mg bid and mexiletine 250 mg bid - Epicardial leads in place. Pacing at 80.   3. CAD with chronic stable angina - s/p multiple stents to LAD and LCX - Cath 9/22 stable CAD - LHC (9/24) with stable 3v CAD - Eventually restart Crestor 40mg  daily - Continue ASA.  4. AKI  -  SCr 1.5 (baseline 1.1) - Keep off SGLT2i  - Avoid hypotension - Post-op sCr 1.38 - Follow BMET   5. DM2 - A1c 7.2 (1/24) - Insulin gtt per TCTS   6. OSA - Not using CPAP, says machine was recalled a while ago. Needs new machine. - Followed outpatient   7. Obesity - Body mass index is 37.62 kg/m. - Has struggled with weight loss - Continue Ozempic outpatient, may have better success with tirzepatide.    I reviewed the LVAD parameters from today, and compared the results to the patient's prior recorded data.  No programming changes were made.  The LVAD is functioning within specified parameters.  The patient performs LVAD self-test daily.  LVAD interrogation was negative for any significant power changes, alarms or PI events/speed drops.  LVAD equipment check completed and is in good working order.  Back-up equipment present.   LVAD education done on emergency procedures and precautions and reviewed exit site care.  Length of Stay: 4  FINCH, LINDSAY N,  PA-C 05/04/2023, 8:05 AM  VAD Team --- VAD ISSUES ONLY--- Pager [phone] (7am - 7am)  Advanced Heart Failure Team  Pager [phone] (M-F; 7a - 5p)  Please contact [facility] for night-coverage after hours (5p -7a ) and weekends on [email or web address]  Agree with above.Marland Kitchen   POD #1 VAD placement   Awake and alert. On N, EPI and milrinone. MAPs marginal. Still with significant pulsativity at 5600. PI 3.5-4.0  Rhythm stable. Good urine output. CTs dry. On high-dose insulin drip   Mildly sore.   General:  Sitting up in bed NAD.  HEENT: normal  Neck: supple. RIJ swan   Cor: LVAD hum. Sternal dressing +CTs and pacing wires Lungs: Clear. Abdomen: obese soft, nontender, non-distended. Hypoactive bowel sounds. Driveline site with dressing in place Anchor in place.  Extremities: no cyanosis, clubbing, rash. Warm 2+ edema  Neuro: alert & oriented x 3. No focal deficits. Moves all 4 without problem   Doing well POD#1.   Seems quite vasoplegic. Add VP. Speed increased 5600->5900 in stepwise fashion.   Significantly volume overloaded but will not diurese until tomorrow given vasoplegia  Begin to mobilize. Adjust insulin gtt.   VAD interrogated personally. Parameters stable.  CRITICAL CARE Performed by: Arvilla Meres  Total critical care time: 55 minutes  Critical care time was exclusive of separately billable procedures and treating other patients.  Critical care was necessary to treat or prevent imminent or life-threatening deterioration.  Critical care was time spent personally by me (independent of midlevel providers or residents) on the following activities: development of treatment plan with patient and/or surrogate as well as nursing, discussions with consultants, evaluation of patient's response to treatment, examination of patient, obtaining history from patient or surrogate, ordering and performing treatments and interventions, ordering and review of laboratory studies, ordering  and review of radiographic studies, pulse oximetry and re-evaluation of patient's condition.  Arvilla Meres, MD  10:40 PM

## 2023-05-04 NOTE — Progress Notes (Incomplete)
Advanced Heart Failure VAD Team Note  PCP-Cardiologist: [name], MD   Subjective:    11/14: s/p HM3 LVAD implantation  Post op day #1     LVAD INTERROGATION:  HeartMate HM3 LVAD:   Flow 5.3 liters/min, speed 5600, Power 4.1, PI 1.7  Objective:    Vital Signs:   Temp:  [95.9 F (35.5 C)-100.4 F (38 C)] 100.4 F (38 C) (11/15 0700) Pulse Rate:  [79-175] 80 (11/15 0700) Resp:  [0-27] 19 (11/15 0700) BP: (80-98)/(65-83) 98/83 (11/15 0400) SpO2:  [86 %-100 %] 99 % (11/15 0700) Arterial Line BP: (74-93)/(62-75) 76/65 (11/15 0700) FiO2 (%):  [50 %-100 %] 50 % (11/15 0400) Weight:  [143.5 kg] 143.5 kg (11/15 0500) Last BM Date : 05/01/23  Mean arterial Pressure 76  Intake/Output:   Intake/Output Summary (Last 24 hours) at 05/04/2023 0744 Last data filed at 05/04/2023 0700 Gross per 24 hour  Intake 11042.01 ml  Output 7138 ml  Net 3904.01 ml   Physical Exam    CVP 7 General:  Intubated/sedated.  HEENT: normal Neck: supple. Carotids 2+ bilat; no bruits. No lymphadenopathy or thyromegaly appreciated. Cor: Mechanical heart sounds with LVAD hum present. MS incision  Lungs: clear, bases diminished Abdomen: taut, distended. No hepatosplenomegaly. No bruits or masses. Good bowel sounds. Driveline: C/D/I; securement device intact and driveline incorporated Extremities: no cyanosis, clubbing, rash. + edema Neuro: intubated/sedated Lines/Devices: ETT, RIJ PAC, CT x4, Foley,   Telemetry   Epicardial leads in place. Paced at 80. (Personally reviewed)  EKG    N/a  Labs   Basic Metabolic Panel: Recent Labs  Lab 05/01/23 0340 05/02/23 0450 05/03/23 0400 05/03/23 0803 05/03/23 1210 05/03/23 1213 05/03/23 1325 05/03/23 1505 05/03/23 1508 05/03/23 2057 05/03/23 2103 05/04/23 0309 05/04/23 0426  NA 136 134* 135   < > 139   < > 141 139 140 137 140 136 141  K 4.1 3.9 4.0   < > 3.7   < > 3.2* 3.0* 3.0* 3.7 3.7 4.1 4.1  CL 99 97* 96*   < > 96*  --   99 104  --  105  --  105  --   CO2 28 28 27   --   --   --   --  24  --  24  --  20*  --   GLUCOSE 157* 131* 124*   < > 176*  --  181* 174*  --  164*  --  153*  --   BUN 20 22* 25*   < > 27*  --  21* 20  --  18  --  16  --   CREATININE 1.50* 1.49* 1.60*   < > 1.30*  --  1.20 1.38*  --  1.48*  --  1.42*  --   CALCIUM 8.7* 8.9 9.0  --   --   --   --  7.9*  --  8.2*  --  8.2*  --   MG 2.4 2.3 2.5*  --   --   --   --  2.1  --  2.6*  --   --   --   PHOS  --   --   --   --   --   --   --   --   --   --   --  3.7  --    < > = values in this interval not displayed.    Liver Function Tests: Recent Labs  Lab 05/04/23  0309  AST 63*  ALT 17  ALKPHOS 45  BILITOT 2.1*  PROT 5.8*  ALBUMIN 3.4*   No results for input(s): "LIPASE", "AMYLASE" in the last 168 hours. No results for input(s): "AMMONIA" in the last 168 hours.  CBC: Recent Labs  Lab 05/02/23 0450 05/03/23 0400 05/03/23 0803 05/03/23 1137 05/03/23 1210 05/03/23 1505 05/03/23 1508 05/03/23 2057 05/03/23 2103 05/04/23 0309 05/04/23 0426  WBC 6.6 6.7  --   --   --  15.3*  --  15.9*  --  16.0*  --   NEUTROABS  --   --   --   --   --   --   --   --   --  13.1*  --   HGB 13.9 14.4   < > 9.6*   < > 11.3* 11.2* 9.9* 9.9* 9.9* 9.5*  HCT 40.9 42.1   < > 28.4*   < > 32.6* 33.0* 28.8* 29.0* 28.6* 28.0*  MCV 93.4 92.9  --   --   --  93.4  --  92.6  --  94.1  --   PLT 162 172  --  113*  --  135*  135*  --  152  --  163  --    < > = values in this interval not displayed.    INR: Recent Labs  Lab 05/02/23 1909 05/03/23 1505 05/04/23 0309  INR 1.8* 1.4* 1.3*    Other results: EKG:   Imaging   DG Chest Port 1 View  Result Date: 05/03/2023 CLINICAL DATA:  Intraop assess for pneumothorax EXAM: PORTABLE CHEST 1 VIEW COMPARISON:  04/08/2023 FINDINGS: Interval sternotomy and partially visualized LVAD. Endotracheal tube tip is about 6.9 cm superior to carina. Midline and probable right chest drainage catheters. Airspace disease at  left base. Partially visualized left-sided pacing device. Right IJ catheter with tip visualized to the cavoatrial region. No discrete pneumothorax on limited view of the chest IMPRESSION: 1. Interval sternotomy with support apparatus as above. 2. Airspace disease at left base, probable atelectasis Electronically Signed   By: Jasmine Pang M.D.   On: 05/03/2023 18:39   DG Chest Port 1 View  Result Date: 05/03/2023 CLINICAL DATA:  LVAD EXAM: PORTABLE CHEST 1 VIEW COMPARISON:  04/08/2023, 05/03/2023 FINDINGS: Endotracheal tube tip is about 5.1 cm superior to the carina. Esophageal tube tip coursing towards diaphragm but poorly visible distally. Left-sided pacing device as before. Right IJ Swan-Ganz catheter tip directed to the right in the region of the main pulmonary artery. Post sternotomy changes, probable drainage catheter over the middle chest. Partially visualize LVAD at the ventricular apex. Probable drainage catheter over the right chest. Question tiny right apical pneumothorax. Airspace disease in the left mid lung and lung base. Subsegmental atelectasis right lower lung. IMPRESSION: 1. Endotracheal tube tip about 5.1 cm superior to the carina. 2. Esophageal tube tip coursing towards diaphragm but poorly visible distally. 3. Other support lines and tubes as above. Partially imaged LVAD at the left ventricular apex. 4. Question tiny right apical pneumothorax, attention on follow-up imaging. 5. Stable mild cardiomegaly. Airspace disease in the left mid lung and lung base, probable atelectasis. No pneumothorax is seen Electronically Signed   By: Jasmine Pang M.D.   On: 05/03/2023 18:35   ECHO INTRAOPERATIVE TEE  Result Date: 05/03/2023  *INTRAOPERATIVE TRANSESOPHAGEAL REPORT *  Patient Name:   Joe [name] [facility] Date of Exam: 05/03/2023 Medical Rec #:  [id number]  Height:       75.0 in Accession #:    [id number]             Weight:       302.5 lb Date of Birth:  1971/02/09                BSA:          2.62 m Patient Age:    52 years               BP:           113/76 mmHg Patient Gender: M                      HR:           71 bpm. Exam Location:  Anesthesiology Transesophogeal exam was perform intraoperatively during surgical procedure. Patient was closely monitored under general anesthesia during the entirety of examination. Indications:     I50.9* Heart failure (unspecified) Sonographer:     Irving Burton Senior RDCS Performing Phys: [id number] PETER VANTRIGT Diagnosing Phys: Earl Lites Stoltzfus Complications: No known complications during this procedure. POST-OP IMPRESSIONS _ Right Ventricle: The right ventricle appears unchanged from pre-bypass. _ Aorta: The aorta appears unchanged from pre-bypass. _ Left Atrial Appendage: The left atrial appendage appears unchanged from pre-bypass. _ Aortic Valve: The aortic valve appears unchanged from pre-bypass. _ Mitral Valve: The mitral valve appears unchanged from pre-bypass. _ Tricuspid Valve: The tricuspid valve appears unchanged from pre-bypass. _ Pulmonic Valve: The pulmonic valve appears unchanged from pre-bypass. _ Interatrial Septum: The interatrial septum appears unchanged from pre-bypass. _ Pericardium: The pericardium appears unchanged from pre-bypass. _ Comments: S/P HM III VAD implant. Ramped to 5600 without incident. IV septum with mild rightward bowing. AV opens intermittently. Inflow/outflow cannula velocities WNL. Emergence on NE, Epi, Milrinone as well as NO.  Left Ventricular Assist Device; Ramp Study: The aortic valve opens intermittently consistent with some native left ventricular ejection. The inflow cannula was visualized and No evidence of obstruction to flow of the inflow cannula. LVAD function appears to be normal.  PRE-OP FINDINGS  Left Ventricle: The left ventricle ejection fraction is 20-25%. The cavity size was severely dilated. Left ventricular diffuse hypokinesis. There is no left ventricular hypertrophy. Left ventricular diastolic  parameters are consistent with Grade III diastolic dysfunction (restrictive). Right Ventricle: The right ventricle has moderately reduced systolic function. The cavity was dialated. There is no increase in right ventricular wall thickness. Right ventricular systolic pressure is moderately elevated. Left Atrium: Left atrial size was dilated. No left atrial/left atrial appendage thrombus was detected. There is echo contrast seen in the left atrial cavity and left atrial appendage. Left atrial appendage velocity is normal at greater than 40 cm/s. Right Atrium: Right atrial size was dilated. Interatrial Septum: No atrial level shunt detected by color flow Doppler. There is no evidence of a patent foramen ovale. Pericardium: There is no evidence of pericardial effusion. There is no pleural effusion. Mitral Valve: The mitral valve is normal in structure. Mitral valve regurgitation is moderate to severe by color flow Doppler. The MR jet is posteriorly-directed. There is no evidence of mitral valve vegetation. Pulmonary venous flow is blunted (decreased). There is moderate mitral valve regurgitation by PISA measuring 5.09. Moderate mitral annular dilatation. There is No evidence of mitral stenosis. 2D PISA EROA .38cm2, RV 63cc. Tricuspid Valve: The tricuspid valve was normal in structure. Tricuspid valve regurgitation is mild by color flow Doppler. No evidence of  tricuspid stenosis is present. There is no evidence of tricuspid valve vegetation. Aortic Valve: The aortic valve is tricuspid Aortic valve regurgitation was not visualized by color flow Doppler. There is no stenosis of the aortic valve, with a calculated valve area of 1.86 cm. There is no evidence of aortic valve vegetation. Pulmonic Valve: The pulmonic valve was normal in structure. Pulmonic valve regurgitation is trivial by color flow Doppler. Aorta: The aortic root and ascending aorta are normal in size and structure. Pulmonary Artery: Theone Murdoch catheter  present on the right. The pulmonary artery is of normal size. Venous: The inferior vena cava is dilated in size with greater than 50% respiratory variability, suggesting right atrial pressure of 8 mmHg. Shunts: There is no evidence of an atrial septal defect. +--------------+--------++ LEFT VENTRICLE          +----------------+---------++ +--------------+--------++  Diastology                PLAX 2D                 +----------------+---------++ +--------------+--------++  LV e' lateral:  6.79 cm/s LVIDd:        7.10 cm   +----------------+---------++ +--------------+--------++  LV E/e' lateral:11.0      LVIDs:        6.40 cm   +----------------+---------++ +--------------+--------++  LV e' medial:   6.79 cm/s LVOT diam:    2.20 cm   +----------------+---------++ +--------------+--------++  LV E/e' medial: 11.0      LV SV:        55 ml     +----------------+---------++ +--------------+--------++ LV SV Index:  20.12    +--------------+--------++ LVOT Area:    3.80 cm +--------------+--------++                        +--------------+--------++ +---------------+---------+---------+ RIGHT VENTRICLE                   +---------------+---------+---------+ RV Basal diam: 5.90 cm            +---------------+---------+---------+ RV Mid diam:   5.30 cm            +---------------+---------+---------+ RV S prime:    5.76 cm/s14.4 cm/s +---------------+---------+---------+ TAPSE (M-mode):2.4 cm   2.37 cm   +---------------+---------+---------+ RVSP:          32.2 mmHg          +---------------+---------+---------+ +------------+---------+++ RIGHT ATRIUM          +------------+---------+++ RA Pressure:8.00 mmHg +------------+---------+++  +------------------+-----------++ AORTIC VALVE                  +------------------+-----------++ AV Area (Vmax):   1.93 cm    +------------------+-----------++ AV Area (Vmean):  2.02 cm     +------------------+-----------++ AV Area (VTI):    1.86 cm    +------------------+-----------++ AV Vmax:          99.70 cm/s  +------------------+-----------++ AV Vmean:         68.500 cm/s +------------------+-----------++ AV VTI:           0.210 m     +------------------+-----------++ AV Peak Grad:     4.0 mmHg    +------------------+-----------++ AV Mean Grad:     2.0 mmHg    +------------------+-----------++ LVOT Vmax:        50.60 cm/s  +------------------+-----------++ LVOT Vmean:       36.400 cm/s +------------------+-----------++ LVOT VTI:         0.103 m     +------------------+-----------++  LVOT/AV VTI ratio:0.49        +------------------+-----------++  +--------------+-------++ AORTA                 +--------------+-------++ Ao Sinus diam:3.60 cm +--------------+-------++ Ao STJ diam:  3.2 cm  +--------------+-------++ Ao Asc diam:  3.10 cm +--------------+-------++ +--------------+----------++     +---------------+-----------++ MITRAL VALVE                 TRICUSPID VALVE            +--------------+----------++     +---------------+-----------++ MV Area (PHT):4.83 cm       TR Peak grad:  24.2 mmHg   +--------------+----------++     +---------------+-----------++ MV Peak grad: 2.3 mmHg       TR Mean grad:  13.0 mmHg   +--------------+----------++     +---------------+-----------++ MV Mean grad: 1.0 mmHg       TR Vmax:       246.00 cm/s +--------------+----------++     +---------------+-----------++ MV Vmax:      0.76 m/s       TR Vmean:      160.0 cm/s  +--------------+----------++     +---------------+-----------++ MV Vmean:     38.4 cm/s      Estimated RAP: 8.00 mmHg   +--------------+----------++     +---------------+-----------++ MV VTI:       0.13 m         RVSP:          32.2 mmHg   +--------------+----------++     +---------------+-----------++ MV PHT:       45.53 msec  +--------------+----------++     +--------------+-------+ MV Decel Time:157 msec       SHUNTS                +--------------+----------++     +--------------+-------+ +----------------+-----------++  Systemic VTI: 0.10 m  MR Peak grad:   82.8 mmHg    +--------------+-------+ +----------------+-----------++  Systemic Diam:2.20 cm MR Mean grad:   50.0 mmHg    +--------------+-------+ +----------------+-----------++ MR Vmax:        455.00 cm/s +----------------+-----------++ MR Vmean:       324.0 cm/s  +----------------+-----------++ MR PISA:        5.09 cm    +----------------+-----------++ MR PISA Eff ROA:38 mm      +----------------+-----------++ MR PISA Radius: 0.90 cm     +----------------+-----------++ +--------------+----------++ MV E velocity:74.90 cm/s +--------------+----------++ MV A velocity:34.70 cm/s +--------------+----------++ MV E/A ratio: 2.16       +--------------+----------++  Hester Mates Electronically signed by Hester Mates Signature Date/Time: 05/03/2023/3:56:36 PM    Final    EP STUDY  Result Date: 05/03/2023 See surgical note for result.   Medications:    Scheduled Medications:  acetaminophen  1,000 mg Oral Q6H   Or   acetaminophen (TYLENOL) oral liquid 160 mg/5 mL  1,000 mg Per Tube Q6H   allopurinol  100 mg Oral Daily   aspirin EC  325 mg Oral Daily   Or   aspirin  324 mg Per Tube Daily   Or   aspirin  300 mg Rectal Daily   aspirin EC  81 mg Oral Daily   bisacodyl  10 mg Oral Daily   Or   bisacodyl  10 mg Rectal Daily   Chlorhexidine Gluconate Cloth  6 each Topical Daily   feeding supplement  237 mL Oral BID BM   levothyroxine  25 mcg Oral Daily   mouth rinse  15 mL Mouth Rinse Q2H  potassium chloride SA  40 mEq Oral TID   sodium chloride flush  10-40 mL Intracatheter Q12H   sodium chloride flush  3 mL Intravenous Q12H   sodium chloride flush  3 mL Intravenous Q12H   sodium chloride flush  3 mL  Intravenous Q12H   sodium chloride flush  3 mL Intravenous Q8H    Infusions:  sodium chloride Stopped (05/04/23 0521)   sodium chloride     sodium chloride Stopped (05/04/23 0546)    ceFAZolin (ANCEF) IV Stopped (05/04/23 0545)   dexmedetomidine (PRECEDEX) IV infusion 0.5 mcg/kg/hr (05/04/23 0700)   epinephrine 7 mcg/min (05/04/23 0700)   famotidine (PEPCID) IV Stopped (05/03/23 2148)   insulin 12 Units/hr (05/04/23 0700)   lactated ringers     lactated ringers 20 mL/hr at 05/04/23 0700   milrinone 0.125 mcg/kg/min (05/04/23 0700)   norepinephrine (LEVOPHED) Adult infusion 11 mcg/min (05/04/23 0700)   vancomycin Stopped (05/03/23 2341)   vasopressin      PRN Medications: acetaminophen, dextrose, midazolam, morphine injection, ondansetron (ZOFRAN) IV, mouth rinse, oxyCODONE, polyvinyl alcohol, sodium chloride flush, sodium chloride flush, sodium chloride flush, traMADol  Patient Profile    Joe [name] is a 52 y.o. male with h/o morbid obesity, DM2, HTN, HL, OSA, CAD and systolic HF. Now s/p HM3 LVAD Insertion 11/14 by Dt. Vantrigt.  Assessment/Plan:    1.  Chronic Systolic HF s/p HM3 Insertion 62/13 - due to iCM - Echo (8/24): EF 20-25%, RV ok. - Echo 05/01/23: EF 20%, RV moderately down (improved) - R/LHC (9/24): stable 3v CAD, elevated biventricular filling pressures with low PAPi; CI 2.2 - RHC (11/24): RA 9, PA mean 28, PCWP 19, Fick CI 1.9, TD CI 2.2, PVR 1.6, PAPi 2.4 - 11/14 HM3 Insertion by Dr. Maren Beach - Received FFP x6, Cryo x2, PLT x1, and DDAVP in OR - LVAD Set Speed 5600 at ~5.3LPM.  - Continue Epi, NE, Vaso, Milrinone for CO/RV support. Wean as able. - CVP 7, PAP 25/11, PW 10, CO/CI 4.99/1.91 - Hold GDMT in acute post op setting - Monitor CT output and Hgb for bleeding. Post-op Hgb stable 11.3. - Fast track wean and extubate, as able - Heparin bridge per CTS    2. H/o VT Arrest  Hx PVCs - Followed by Dr. Elberta Fortis - recurrent episodes - Admitted  10/10-10/13/24 w/ VF treated w/ ICD shock>>loaded w/ IV amio  - Re-admitted 04/08/23 w/ VT arrest, treated w/ appropriate ICD shock x 2 + brief bystander CPR  - in setting of underling CM w/ EF < 35% + intermittent hypokalemia  - Hold Amio 200 mg bid and mexiletine 250 mg bid - Epicardial leads in place. Pacing at 80.   3. CAD with chronic stable angina - s/p multiple stents to LAD and LCX - Cath 9/22 stable CAD - LHC (9/24) with stable 3v CAD - Hold Ranexa 500 mg bid and Crestor 40mg  daily - Continue ASA.  4. AKI  - SCr 1.5 (baseline 1.1) - Keep off SGLT2i  - Avoid hypotension - Post-op sCr 1.38 - Follow BMET   5. DM2 - A1c 7.2 (1/24)   6. OSA - Not using CPAP, says machine was recalled a while ago. Needs new machine. - Followed outpatient   7. Obesity - Body mass index is 37.62 kg/m. - Has struggled with weight loss - Continue Ozempic outpatient, may have better success with tirzepatide.  I reviewed the LVAD parameters from today, and compared the results to the patient's prior recorded data.  No programming changes were made.  The LVAD is functioning within specified parameters.  The patient performs LVAD self-test daily.  LVAD interrogation was negative for any significant power changes, alarms or PI events/speed drops.  LVAD equipment check completed and is in good working order.  Back-up equipment present.   LVAD education done on emergency procedures and precautions and reviewed exit site care.  Length of Stay: 4  Ashon Rosenberg, Dalbert Garnet, PA-C 05/04/2023, 7:44 AM  VAD Team --- VAD ISSUES ONLY--- Pager [phone] (7am - 7am)  Advanced Heart Failure Team  Pager [phone] (M-F; 7a - 5p)  Please contact [facility] for night-coverage after hours (5p -7a ) and weekends on [email or web address]

## 2023-05-04 NOTE — Progress Notes (Signed)
HeartMate 3 Rounding Note  Subjective:    Awake on vent, follows commands VAD parameters satisfactory Urine output 100/hr, low chest tube output Nitric weaned to 3 ppm  LVAD INTERROGATION:  HeartMate II LVAD:  Flow 5.2 liters/min, speed 5600, power 3.0, PI 2.1.  Controller connected.   Objective:    Vital Signs:   Temp:  [95.9 F (35.5 C)-100.6 F (38.1 C)] 100.6 F (38.1 C) (11/15 0800) Pulse Rate:  [79-177] 177 (11/15 0800) Resp:  [0-27] 16 (11/15 0800) BP: (80-98)/(54-83) 87/54 (11/15 0800) SpO2:  [86 %-100 %] 99 % (11/15 0800) Arterial Line BP: (71-93)/(62-75) 71/62 (11/15 0800) FiO2 (%):  [40 %-100 %] 40 % (11/15 0730) Weight:  [143.5 kg] 143.5 kg (11/15 0500) Last BM Date : 05/01/23 Mean arterial Pressure 72 mm Hg  Intake/Output:   Intake/Output Summary (Last 24 hours) at 05/04/2023 0842 Last data filed at 05/04/2023 0800 Gross per 24 hour  Intake 11011.99 ml  Output 7268 ml  Net 3743.99 ml     Physical Exam: General:  Well appearing. No resp difficulty HEENT: normal Neck: supple. JVP . Carotids  no bruits. No lymphadenopathy or thryomegaly appreciated. Cor: Mechanical heart sounds with LVAD hum present. Lungs: clear Abdomen: soft, nontender, nondistended. No hepatosplenomegaly. No bruits or masses. Good bowel sounds. Extremities: no cyanosis, clubbing, rash, edema Neuro: alert & orientedx3, cranial nerves grossly intact. moves all 4 extremities w/o difficulty. Affect pleasant  Telemetry: A-V paced 80/min  Labs: Basic Metabolic Panel: Recent Labs  Lab 05/01/23 0340 05/02/23 0450 05/03/23 0400 05/03/23 0803 05/03/23 1210 05/03/23 1213 05/03/23 1325 05/03/23 1505 05/03/23 1508 05/03/23 2057 05/03/23 2103 05/04/23 0309 05/04/23 0426  NA 136 134* 135   < > 139   < > 141 139 140 137 140 136 141  K 4.1 3.9 4.0   < > 3.7   < > 3.2* 3.0* 3.0* 3.7 3.7 4.1 4.1  CL 99 97* 96*   < > 96*  --  99 104  --  105  --  105  --   CO2 28 28 27   --   --   --    --  24  --  24  --  20*  --   GLUCOSE 157* 131* 124*   < > 176*  --  181* 174*  --  164*  --  153*  --   BUN 20 22* 25*   < > 27*  --  21* 20  --  18  --  16  --   CREATININE 1.50* 1.49* 1.60*   < > 1.30*  --  1.20 1.38*  --  1.48*  --  1.42*  --   CALCIUM 8.7* 8.9 9.0  --   --   --   --  7.9*  --  8.2*  --  8.2*  --   MG 2.4 2.3 2.5*  --   --   --   --  2.1  --  2.6*  --   --   --   PHOS  --   --   --   --   --   --   --   --   --   --   --  3.7  --    < > = values in this interval not displayed.    Liver Function Tests: Recent Labs  Lab 05/04/23 0309  AST 63*  ALT 17  ALKPHOS 45  BILITOT 2.1*  PROT 5.8*  ALBUMIN 3.4*  No results for input(s): "LIPASE", "AMYLASE" in the last 168 hours. No results for input(s): "AMMONIA" in the last 168 hours.  CBC: Recent Labs  Lab 05/02/23 0450 05/03/23 0400 05/03/23 0803 05/03/23 1137 05/03/23 1210 05/03/23 1505 05/03/23 1508 05/03/23 2057 05/03/23 2103 05/04/23 0309 05/04/23 0426  WBC 6.6 6.7  --   --   --  15.3*  --  15.9*  --  16.0*  --   NEUTROABS  --   --   --   --   --   --   --   --   --  13.1*  --   HGB 13.9 14.4   < > 9.6*   < > 11.3* 11.2* 9.9* 9.9* 9.9* 9.5*  HCT 40.9 42.1   < > 28.4*   < > 32.6* 33.0* 28.8* 29.0* 28.6* 28.0*  MCV 93.4 92.9  --   --   --  93.4  --  92.6  --  94.1  --   PLT 162 172  --  113*  --  135*  135*  --  152  --  163  --    < > = values in this interval not displayed.    INR: Recent Labs  Lab 05/02/23 1909 05/03/23 1505 05/04/23 0309  INR 1.8* 1.4* 1.3*    Other results: EKG:   Imaging: DG Chest Port 1 View  Result Date: 05/03/2023 CLINICAL DATA:  Intraop assess for pneumothorax EXAM: PORTABLE CHEST 1 VIEW COMPARISON:  04/08/2023 FINDINGS: Interval sternotomy and partially visualized LVAD. Endotracheal tube tip is about 6.9 cm superior to carina. Midline and probable right chest drainage catheters. Airspace disease at left base. Partially visualized left-sided pacing device. Right  IJ catheter with tip visualized to the cavoatrial region. No discrete pneumothorax on limited view of the chest IMPRESSION: 1. Interval sternotomy with support apparatus as above. 2. Airspace disease at left base, probable atelectasis Electronically Signed   By: Jasmine Pang M.D.   On: 05/03/2023 18:39   DG Chest Port 1 View  Result Date: 05/03/2023 CLINICAL DATA:  LVAD EXAM: PORTABLE CHEST 1 VIEW COMPARISON:  04/08/2023, 05/03/2023 FINDINGS: Endotracheal tube tip is about 5.1 cm superior to the carina. Esophageal tube tip coursing towards diaphragm but poorly visible distally. Left-sided pacing device as before. Right IJ Swan-Ganz catheter tip directed to the right in the region of the main pulmonary artery. Post sternotomy changes, probable drainage catheter over the middle chest. Partially visualize LVAD at the ventricular apex. Probable drainage catheter over the right chest. Question tiny right apical pneumothorax. Airspace disease in the left mid lung and lung base. Subsegmental atelectasis right lower lung. IMPRESSION: 1. Endotracheal tube tip about 5.1 cm superior to the carina. 2. Esophageal tube tip coursing towards diaphragm but poorly visible distally. 3. Other support lines and tubes as above. Partially imaged LVAD at the left ventricular apex. 4. Question tiny right apical pneumothorax, attention on follow-up imaging. 5. Stable mild cardiomegaly. Airspace disease in the left mid lung and lung base, probable atelectasis. No pneumothorax is seen Electronically Signed   By: Jasmine Pang M.D.   On: 05/03/2023 18:35   ECHO INTRAOPERATIVE TEE  Result Date: 05/03/2023  *INTRAOPERATIVE TRANSESOPHAGEAL REPORT *  Patient Name:   Joe [name] [facility] Date of Exam: 05/03/2023 Medical Rec #:  [id number]              Height:       75.0 in Accession #:    [id number]  Weight:       302.5 lb Date of Birth:  Mar 18, 1971               BSA:          2.62 m Patient Age:    52 years               BP:            113/76 mmHg Patient Gender: M                      HR:           71 bpm. Exam Location:  Anesthesiology Transesophogeal exam was perform intraoperatively during surgical procedure. Patient was closely monitored under general anesthesia during the entirety of examination. Indications:     I50.9* Heart failure (unspecified) Sonographer:     Irving Burton Senior RDCS Performing Phys: [id number] Paden Kuras Diagnosing Phys: Earl Lites Stoltzfus Complications: No known complications during this procedure. POST-OP IMPRESSIONS _ Right Ventricle: The right ventricle appears unchanged from pre-bypass. _ Aorta: The aorta appears unchanged from pre-bypass. _ Left Atrial Appendage: The left atrial appendage appears unchanged from pre-bypass. _ Aortic Valve: The aortic valve appears unchanged from pre-bypass. _ Mitral Valve: The mitral valve appears unchanged from pre-bypass. _ Tricuspid Valve: The tricuspid valve appears unchanged from pre-bypass. _ Pulmonic Valve: The pulmonic valve appears unchanged from pre-bypass. _ Interatrial Septum: The interatrial septum appears unchanged from pre-bypass. _ Pericardium: The pericardium appears unchanged from pre-bypass. _ Comments: S/P HM III VAD implant. Ramped to 5600 without incident. IV septum with mild rightward bowing. AV opens intermittently. Inflow/outflow cannula velocities WNL. Emergence on NE, Epi, Milrinone as well as NO.  Left Ventricular Assist Device; Ramp Study: The aortic valve opens intermittently consistent with some native left ventricular ejection. The inflow cannula was visualized and No evidence of obstruction to flow of the inflow cannula. LVAD function appears to be normal.  PRE-OP FINDINGS  Left Ventricle: The left ventricle ejection fraction is 20-25%. The cavity size was severely dilated. Left ventricular diffuse hypokinesis. There is no left ventricular hypertrophy. Left ventricular diastolic parameters are consistent with Grade III diastolic dysfunction  (restrictive). Right Ventricle: The right ventricle has moderately reduced systolic function. The cavity was dialated. There is no increase in right ventricular wall thickness. Right ventricular systolic pressure is moderately elevated. Left Atrium: Left atrial size was dilated. No left atrial/left atrial appendage thrombus was detected. There is echo contrast seen in the left atrial cavity and left atrial appendage. Left atrial appendage velocity is normal at greater than 40 cm/s. Right Atrium: Right atrial size was dilated. Interatrial Septum: No atrial level shunt detected by color flow Doppler. There is no evidence of a patent foramen ovale. Pericardium: There is no evidence of pericardial effusion. There is no pleural effusion. Mitral Valve: The mitral valve is normal in structure. Mitral valve regurgitation is moderate to severe by color flow Doppler. The MR jet is posteriorly-directed. There is no evidence of mitral valve vegetation. Pulmonary venous flow is blunted (decreased). There is moderate mitral valve regurgitation by PISA measuring 5.09. Moderate mitral annular dilatation. There is No evidence of mitral stenosis. 2D PISA EROA .38cm2, RV 63cc. Tricuspid Valve: The tricuspid valve was normal in structure. Tricuspid valve regurgitation is mild by color flow Doppler. No evidence of tricuspid stenosis is present. There is no evidence of tricuspid valve vegetation. Aortic Valve: The aortic valve is tricuspid Aortic valve regurgitation was not visualized by color  flow Doppler. There is no stenosis of the aortic valve, with a calculated valve area of 1.86 cm. There is no evidence of aortic valve vegetation. Pulmonic Valve: The pulmonic valve was normal in structure. Pulmonic valve regurgitation is trivial by color flow Doppler. Aorta: The aortic root and ascending aorta are normal in size and structure. Pulmonary Artery: Theone Murdoch catheter present on the right. The pulmonary artery is of normal size.  Venous: The inferior vena cava is dilated in size with greater than 50% respiratory variability, suggesting right atrial pressure of 8 mmHg. Shunts: There is no evidence of an atrial septal defect. +--------------+--------++ LEFT VENTRICLE          +----------------+---------++ +--------------+--------++  Diastology                PLAX 2D                 +----------------+---------++ +--------------+--------++  LV e' lateral:  6.79 cm/s LVIDd:        7.10 cm   +----------------+---------++ +--------------+--------++  LV E/e' lateral:11.0      LVIDs:        6.40 cm   +----------------+---------++ +--------------+--------++  LV e' medial:   6.79 cm/s LVOT diam:    2.20 cm   +----------------+---------++ +--------------+--------++  LV E/e' medial: 11.0      LV SV:        55 ml     +----------------+---------++ +--------------+--------++ LV SV Index:  20.12    +--------------+--------++ LVOT Area:    3.80 cm +--------------+--------++                        +--------------+--------++ +---------------+---------+---------+ RIGHT VENTRICLE                   +---------------+---------+---------+ RV Basal diam: 5.90 cm            +---------------+---------+---------+ RV Mid diam:   5.30 cm            +---------------+---------+---------+ RV S prime:    5.76 cm/s14.4 cm/s +---------------+---------+---------+ TAPSE (M-mode):2.4 cm   2.37 cm   +---------------+---------+---------+ RVSP:          32.2 mmHg          +---------------+---------+---------+ +------------+---------+++ RIGHT ATRIUM          +------------+---------+++ RA Pressure:8.00 mmHg +------------+---------+++  +------------------+-----------++ AORTIC VALVE                  +------------------+-----------++ AV Area (Vmax):   1.93 cm    +------------------+-----------++ AV Area (Vmean):  2.02 cm    +------------------+-----------++ AV Area (VTI):    1.86 cm     +------------------+-----------++ AV Vmax:          99.70 cm/s  +------------------+-----------++ AV Vmean:         68.500 cm/s +------------------+-----------++ AV VTI:           0.210 m     +------------------+-----------++ AV Peak Grad:     4.0 mmHg    +------------------+-----------++ AV Mean Grad:     2.0 mmHg    +------------------+-----------++ LVOT Vmax:        50.60 cm/s  +------------------+-----------++ LVOT Vmean:       36.400 cm/s +------------------+-----------++ LVOT VTI:         0.103 m     +------------------+-----------++ LVOT/AV VTI ratio:0.49        +------------------+-----------++  +--------------+-------++ AORTA                 +--------------+-------++  Ao Sinus diam:3.60 cm +--------------+-------++ Ao STJ diam:  3.2 cm  +--------------+-------++ Ao Asc diam:  3.10 cm +--------------+-------++ +--------------+----------++     +---------------+-----------++ MITRAL VALVE                 TRICUSPID VALVE            +--------------+----------++     +---------------+-----------++ MV Area (PHT):4.83 cm       TR Peak grad:  24.2 mmHg   +--------------+----------++     +---------------+-----------++ MV Peak grad: 2.3 mmHg       TR Mean grad:  13.0 mmHg   +--------------+----------++     +---------------+-----------++ MV Mean grad: 1.0 mmHg       TR Vmax:       246.00 cm/s +--------------+----------++     +---------------+-----------++ MV Vmax:      0.76 m/s       TR Vmean:      160.0 cm/s  +--------------+----------++     +---------------+-----------++ MV Vmean:     38.4 cm/s      Estimated RAP: 8.00 mmHg   +--------------+----------++     +---------------+-----------++ MV VTI:       0.13 m         RVSP:          32.2 mmHg   +--------------+----------++     +---------------+-----------++ MV PHT:       45.53 msec +--------------+----------++     +--------------+-------+ MV Decel  Time:157 msec       SHUNTS                +--------------+----------++     +--------------+-------+ +----------------+-----------++  Systemic VTI: 0.10 m  MR Peak grad:   82.8 mmHg    +--------------+-------+ +----------------+-----------++  Systemic Diam:2.20 cm MR Mean grad:   50.0 mmHg    +--------------+-------+ +----------------+-----------++ MR Vmax:        455.00 cm/s +----------------+-----------++ MR Vmean:       324.0 cm/s  +----------------+-----------++ MR PISA:        5.09 cm    +----------------+-----------++ MR PISA Eff ROA:38 mm      +----------------+-----------++ MR PISA Radius: 0.90 cm     +----------------+-----------++ +--------------+----------++ MV E velocity:74.90 cm/s +--------------+----------++ MV A velocity:34.70 cm/s +--------------+----------++ MV E/A ratio: 2.16       +--------------+----------++  Hester Mates Electronically signed by Hester Mates Signature Date/Time: 05/03/2023/3:56:36 PM    Final    EP STUDY  Result Date: 05/03/2023 See surgical note for result.    Medications:     Scheduled Medications:  acetaminophen  1,000 mg Oral Q6H   Or   acetaminophen (TYLENOL) oral liquid 160 mg/5 mL  1,000 mg Per Tube Q6H   allopurinol  100 mg Per Tube Daily   aspirin EC  325 mg Oral Daily   Or   aspirin  324 mg Per Tube Daily   Or   aspirin  300 mg Rectal Daily   aspirin EC  81 mg Oral Daily   bisacodyl  10 mg Oral Daily   Or   bisacodyl  10 mg Rectal Daily   Chlorhexidine Gluconate Cloth  6 each Topical Daily   feeding supplement  237 mL Oral BID BM   [START ON 05/05/2023] levothyroxine  25 mcg Per Tube Daily   mouth rinse  15 mL Mouth Rinse Q2H   potassium chloride  20 mEq Per Tube Once   sodium chloride flush  10-40 mL Intracatheter Q12H   sodium chloride flush  3 mL Intravenous Q12H   sodium chloride flush  3 mL Intravenous Q12H   sodium chloride flush  3 mL Intravenous Q12H   sodium  chloride flush  3 mL Intravenous Q8H    Infusions:  sodium chloride 20 mL/hr at 05/04/23 0800   sodium chloride     sodium chloride 10 mL/hr at 05/04/23 0800   albumin human 12.5 g (05/04/23 0826)   albumin human      ceFAZolin (ANCEF) IV Stopped (05/04/23 0545)   dexmedetomidine (PRECEDEX) IV infusion 0.5 mcg/kg/hr (05/04/23 0800)   epinephrine 7 mcg/min (05/04/23 0800)   famotidine (PEPCID) IV Stopped (05/03/23 2148)   insulin 10.5 Units/hr (05/04/23 0817)   lactated ringers     lactated ringers 20 mL/hr at 05/04/23 0800   milrinone 0.125 mcg/kg/min (05/04/23 0800)   norepinephrine (LEVOPHED) Adult infusion 12 mcg/min (05/04/23 0818)   vancomycin Stopped (05/03/23 2341)   vasopressin      PRN Medications: acetaminophen, albumin human, dextrose, midazolam, morphine injection, ondansetron (ZOFRAN) IV, mouth rinse, oxyCODONE, polyvinyl alcohol, sodium chloride flush, sodium chloride flush, sodium chloride flush, traMADol   Assessment:  Ischemic cardiomyopathy with advance CHF DM Morbid obesity HM3 implant 05-03-2023   Plan/Discussion:   Plan extubation today Leave swan and chest tubes Start coumadin this pm   I reviewed the LVAD parameters from today, and compared the results to the patient's prior recorded data.  No programming changes were made.  The LVAD is functioning within specified parameters.  The patient performs LVAD self-test daily.  LVAD interrogation was negative for any significant power changes, alarms or PI events/speed drops.  LVAD equipment check completed and is in good working order.  Back-up equipment present.   LVAD education done on emergency procedures and precautions and reviewed exit site care.  Length of Stay: 4  Lovett Sox 05/04/2023, 8:42 AM

## 2023-05-04 NOTE — Progress Notes (Signed)
LVAD Coordinator Rounding Note:  Admitted 04/30/23 due to for optimization prior to VAD.   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  Pt doing well POD1. Extubated this morning and dangled on edge of the bed. Wife and daughter in law at bedside.   Dr. Gala Romney increased VAD speed to 5800 during rounds this morning.   Vital signs: Temp:100.6 HR: 80 Doppler Pressure:72 Automatic BP: 87/54 (65) O2 Sat: 99% 4L [place] Wt:316.4lbs    LVAD interrogation reveals:  Speed:5.1 Flow: 5600 Power:  4.1w PI:2.0  Alarms: none Events:  2 PI events Hematocrit: 29 Fixed speed: 5600>5800 Low speed limit: 5300>5500  Drive Line: Existing VAD dressing removed and site care performed using sterile technique. Drive line exit site cleaned with Chlora prep applicators x 2, rinsed with sterile saline and allowed to dry. Silver strip applied flush with exit site. Exit site is unincorporated, the velour is fully implanted at exit site with 2 sutures in place. No redness, tenderness, drainage, foul odor or rash noted. Drive line anchor re-applied. Continue daily dressing changes by nurse champion or VAD Coordinator. Next dressing change 05/05/23.  Labs:  LDH trend: 275  Hgb trend: 9.9  INR trend: 1.3  Anticoagulation Plan: -INR Goal: 2.0-2.5 -ASA Dose: 325mg  - Coumadin dosing per pharmacy to restart tonight  Blood Products:  Intra-op- 6 FFP 1 PLT 2 CYRO  Device: -Medtronic BiV -Therapies: OFF  Arrythmias:   Respiratory: Extubated 05/04/23  Infection:   Renal:  -BUN/CRT: 16/1.42  VAD Education: Wife observed dressing change at bedside today and given instructions and sterile gloves to take home to review.  Adverse Events on VAD:  Plan/Recommendations:  1. Page VAD coordinators for any equipment or drive line issues 2. Daily drive line dressing changes per nurse champion or VAD Coordinator  Simmie Davies RN, BSN VAD  Coordinator 24/7 Pager [phone]

## 2023-05-04 NOTE — Progress Notes (Signed)
OT Cancellation Note  Patient Details Name: Joe [name] MRN: [id number] DOB: 01/18/1971   Cancelled Treatment:    Reason Eval/Treat Not Completed: Other (comment) (LVAD surgery 11/14. Will follow up later date as appropriate.)  [facility] 05/04/2023, 6:34 AM Luisa Dago, OT/L   Acute OT Clinical Specialist Acute Rehabilitation Services Pager [phone] Office [phone]

## 2023-05-04 NOTE — Progress Notes (Signed)
Nitric weaned per protocol to 4 ppm. Nitric will be weaned by 1ppm every hour until 0 as long as patient can tolerate changes. Vital signs are currently stable at this time.   CVP:5 MAP: 73

## 2023-05-04 NOTE — Progress Notes (Signed)
Nitric as been weaned to 0 ppm and put in standby. VS are stable at this time CVP: 14 MAP:81

## 2023-05-04 NOTE — Progress Notes (Signed)
Nitric weaned per protocol to 15 ppm. Vital signs are currently stable at this time  CVP:4  MAP: 73

## 2023-05-04 NOTE — Progress Notes (Signed)
Patient ID: Joe [name], male   DOB: [birth year], 52 y.o.   MRN: [id number]  TCTS Evening Rounds:  Hemodynamics fairly stable on milrinone 0.125, epi 7, NE 14. Vaso 0.03 added today to increase MAP.  Awake and alert.  UO good.  CT output low.  LVAD parameters stable.  BMET    Component Value Date/Time   NA 137 05/04/2023 1700   NA 141 08/10/2022 1534   K 3.9 05/04/2023 1700   CL 105 05/04/2023 1700   CO2 22 05/04/2023 1700   GLUCOSE 166 (H) 05/04/2023 1700   BUN 13 05/04/2023 1700   BUN 11 08/10/2022 1534   CREATININE 1.47 (H) 05/04/2023 1700   CALCIUM 8.4 (L) 05/04/2023 1700   EGFR 81 08/10/2022 1534   GFRNONAA 57 (L) 05/04/2023 1700   CBC    Component Value Date/Time   WBC 17.4 (H) 05/04/2023 1700   RBC 2.69 (L) 05/04/2023 1700   HGB 8.5 (L) 05/04/2023 1700   HGB 14.9 01/28/2021 0858   HCT 25.5 (L) 05/04/2023 1700   HCT 45.0 01/28/2021 0858   PLT 150 05/04/2023 1700   PLT 213 01/28/2021 0858   MCV 94.8 05/04/2023 1700   MCV 93 01/28/2021 0858   MCH 31.6 05/04/2023 1700   MCHC 33.3 05/04/2023 1700   RDW 15.4 05/04/2023 1700   RDW 12.8 01/28/2021 0858   LYMPHSABS 0.9 05/04/2023 0309   LYMPHSABS 0.9 01/28/2021 0858   MONOABS 1.9 (H) 05/04/2023 0309   EOSABS 0.0 05/04/2023 0309   EOSABS 0.2 01/28/2021 0858   BASOSABS 0.0 05/04/2023 0309   BASOSABS 0.0 01/28/2021 5621

## 2023-05-04 NOTE — Progress Notes (Signed)
Nitric weaned per protocol to 5 ppm. Vital signs are currently stable at this time  CVP:5 MAP: 73

## 2023-05-04 NOTE — Progress Notes (Signed)
Nitric weaned per protocol to 10 ppm. Vital signs are currently stable at this time  CVP:5 MAP: 72

## 2023-05-05 ENCOUNTER — Inpatient Hospital Stay (HOSPITAL_COMMUNITY): Payer: 59

## 2023-05-05 DIAGNOSIS — I5023 Acute on chronic systolic (congestive) heart failure: Secondary | ICD-10-CM | POA: Diagnosis not present

## 2023-05-05 DIAGNOSIS — Z95811 Presence of heart assist device: Secondary | ICD-10-CM | POA: Diagnosis not present

## 2023-05-05 DIAGNOSIS — I509 Heart failure, unspecified: Secondary | ICD-10-CM | POA: Diagnosis not present

## 2023-05-05 DIAGNOSIS — I255 Ischemic cardiomyopathy: Secondary | ICD-10-CM | POA: Diagnosis not present

## 2023-05-05 LAB — GLUCOSE, CAPILLARY
Glucose-Capillary: 169 mg/dL — ABNORMAL HIGH (ref 70–99)
Glucose-Capillary: 175 mg/dL — ABNORMAL HIGH (ref 70–99)
Glucose-Capillary: 207 mg/dL — ABNORMAL HIGH (ref 70–99)
Glucose-Capillary: 209 mg/dL — ABNORMAL HIGH (ref 70–99)
Glucose-Capillary: 215 mg/dL — ABNORMAL HIGH (ref 70–99)
Glucose-Capillary: 228 mg/dL — ABNORMAL HIGH (ref 70–99)
Glucose-Capillary: 239 mg/dL — ABNORMAL HIGH (ref 70–99)

## 2023-05-05 LAB — CBC WITH DIFFERENTIAL/PLATELET
Abs Immature Granulocytes: 0.15 10*3/uL — ABNORMAL HIGH (ref 0.00–0.07)
Basophils Absolute: 0 10*3/uL (ref 0.0–0.1)
Basophils Relative: 0 %
Eosinophils Absolute: 0.1 10*3/uL (ref 0.0–0.5)
Eosinophils Relative: 1 %
HCT: 24.9 % — ABNORMAL LOW (ref 39.0–52.0)
Hemoglobin: 8 g/dL — ABNORMAL LOW (ref 13.0–17.0)
Immature Granulocytes: 1 %
Lymphocytes Relative: 6 %
Lymphs Abs: 1.1 10*3/uL (ref 0.7–4.0)
MCH: 31.9 pg (ref 26.0–34.0)
MCHC: 32.1 g/dL (ref 30.0–36.0)
MCV: 99.2 fL (ref 80.0–100.0)
Monocytes Absolute: 2.1 10*3/uL — ABNORMAL HIGH (ref 0.1–1.0)
Monocytes Relative: 11 %
Neutro Abs: 15.1 10*3/uL — ABNORMAL HIGH (ref 1.7–7.7)
Neutrophils Relative %: 81 %
Platelets: 132 10*3/uL — ABNORMAL LOW (ref 150–400)
RBC: 2.51 MIL/uL — ABNORMAL LOW (ref 4.22–5.81)
RDW: 16 % — ABNORMAL HIGH (ref 11.5–15.5)
WBC: 18.5 10*3/uL — ABNORMAL HIGH (ref 4.0–10.5)
nRBC: 0 % (ref 0.0–0.2)

## 2023-05-05 LAB — COOXEMETRY PANEL
Carboxyhemoglobin: 1.6 % — ABNORMAL HIGH (ref 0.5–1.5)
Carboxyhemoglobin: 1.7 % — ABNORMAL HIGH (ref 0.5–1.5)
Carboxyhemoglobin: 1.7 % — ABNORMAL HIGH (ref 0.5–1.5)
Carboxyhemoglobin: 1.8 % — ABNORMAL HIGH (ref 0.5–1.5)
Carboxyhemoglobin: 2.2 % — ABNORMAL HIGH (ref 0.5–1.5)
Methemoglobin: 1.2 % (ref 0.0–1.5)
Methemoglobin: <0.7 % (ref 0.0–1.5)
Methemoglobin: <0.7 % (ref 0.0–1.5)
Methemoglobin: 0.9 % (ref 0.0–1.5)
Methemoglobin: <0.7 % (ref 0.0–1.5)
O2 Saturation: 50.7 %
O2 Saturation: 53.8 %
O2 Saturation: 57 %
O2 Saturation: 56.1 %
O2 Saturation: 52.7 %
Total hemoglobin: 8.4 g/dL — ABNORMAL LOW (ref 12.0–16.0)
Total hemoglobin: 8.1 g/dL — ABNORMAL LOW (ref 12.0–16.0)
Total hemoglobin: 7.4 g/dL — ABNORMAL LOW (ref 12.0–16.0)
Total hemoglobin: 8.8 g/dL — ABNORMAL LOW (ref 12.0–16.0)
Total hemoglobin: 9 g/dL — ABNORMAL LOW (ref 12.0–16.0)

## 2023-05-05 LAB — COMPREHENSIVE METABOLIC PANEL
ALT: 16 U/L (ref 0–44)
AST: 43 U/L — ABNORMAL HIGH (ref 15–41)
Albumin: 3.1 g/dL — ABNORMAL LOW (ref 3.5–5.0)
Alkaline Phosphatase: 45 U/L (ref 38–126)
Anion gap: 8 (ref 5–15)
BUN: 14 mg/dL (ref 6–20)
CO2: 22 mmol/L (ref 22–32)
Calcium: 8.1 mg/dL — ABNORMAL LOW (ref 8.9–10.3)
Chloride: 102 mmol/L (ref 98–111)
Creatinine, Ser: 1.01 mg/dL (ref 0.61–1.24)
GFR, Estimated: >60 mL/min (ref >60)
Glucose, Bld: 216 mg/dL — ABNORMAL HIGH (ref 70–99)
Potassium: 4.2 mmol/L (ref 3.5–5.1)
Sodium: 132 mmol/L — ABNORMAL LOW (ref 135–145)
Total Bilirubin: 2.4 mg/dL — ABNORMAL HIGH (ref <1.2)
Total Protein: 5.7 g/dL — ABNORMAL LOW (ref 6.5–8.1)

## 2023-05-05 LAB — PHOSPHORUS: Phosphorus: 2.7 mg/dL (ref 2.5–4.6)

## 2023-05-05 LAB — MAGNESIUM: Magnesium: 2.3 mg/dL (ref 1.7–2.4)

## 2023-05-05 LAB — APTT: aPTT: 39 s — ABNORMAL HIGH (ref 24–36)

## 2023-05-05 LAB — LACTATE DEHYDROGENASE: LDH: 222 U/L — ABNORMAL HIGH (ref 98–192)

## 2023-05-05 LAB — PROTIME-INR
INR: 1.6 — ABNORMAL HIGH (ref 0.8–1.2)
Prothrombin Time: 19.4 s — ABNORMAL HIGH (ref 11.4–15.2)

## 2023-05-05 LAB — PREPARE RBC (CROSSMATCH)

## 2023-05-05 MED ORDER — INSULIN DETEMIR 100 UNIT/ML ~~LOC~~ SOLN
30.0000 [IU] | Freq: Two times a day (BID) | SUBCUTANEOUS | Status: DC
  Administered 2023-05-05 (×2): 30 [IU] via SUBCUTANEOUS
  Filled 2023-05-05 (×4): qty 0.3

## 2023-05-05 MED ORDER — ORAL CARE MOUTH RINSE
15.0000 mL | OROMUCOSAL | Status: DC | PRN
Start: 2023-05-05 — End: 2023-05-06

## 2023-05-05 MED ORDER — SODIUM CHLORIDE 0.9% IV SOLUTION: Freq: Once | INTRAVENOUS | Status: AC

## 2023-05-05 MED ORDER — MILRINONE LACTATE IN DEXTROSE 20-5 MG/100ML-% IV SOLN
0.2500 ug/kg/min | INTRAVENOUS | Status: DC
  Administered 2023-05-05 – 2023-05-09 (×11): 0.25 ug/kg/min via INTRAVENOUS
  Filled 2023-05-05 (×10): qty 100

## 2023-05-05 MED ORDER — FUROSEMIDE 10 MG/ML IJ SOLN
40.0000 mg | Freq: Once | INTRAMUSCULAR | Status: AC
  Administered 2023-05-05: 40 mg via INTRAVENOUS
  Filled 2023-05-05: qty 4

## 2023-05-05 NOTE — Progress Notes (Signed)
PT Cancellation Note  Patient Details Name: Joe [name] MRN: [id number] DOB: Mar 20, 1971   Cancelled Treatment:    Reason Eval/Treat Not Completed: Other (comment). Per RN pt danged EOB yesterday and this morning. Awaiting for clearance to remove swan ganz line to then further progress mobility. Acute PT to return as able.  [name], PT, DPT Acute Rehabilitation Services Secure chat preferred Office #: [phone]    [name] 05/05/2023, 1:52 PM

## 2023-05-05 NOTE — Progress Notes (Signed)
Patient ID: Joe [name], male   DOB: Dec 12, 1970, 52 y.o.   MRN: [id number]  Hemodynamics stable on milrinone 0.25, epi 7, vaso 0.02  Co-ox 52.7 this pm. Not much difference from multiple other values drawn over the course of the day.  LVAD parameters stable after increase speed to 6000.  Diuresing well and CVP down to 11. CI 2.43. MAP 90's to 100. Wean vaso slowly as BP allows.  Would not make any changes at this time.

## 2023-05-05 NOTE — Progress Notes (Signed)
HeartMate 3 Rounding Note  Subjective:    Stable overnight with cardiac index > 2.5 but coox down to 53% with Hb decreased 8.0- will give 1 u PRBC . Chest tubes out 750 so leave tubes. INR up 1.6, hold coumadin. Urine output  > 100/hr and creat better 1.0  Hope to remove swan and mobilize OOB to chair   LVAD INTERROGATION:  HeartMate II LVAD:  Flow 5.2 liters/min, speed 5900, power 3, PI 3.1.  Controller secure.   Objective:    Vital Signs:   Temp:  [98.6 F (37 C)-101.3 F (38.5 C)] 98.6 F (37 C) (11/16 0900) Pulse Rate:  [78-207] 79 (11/16 0900) Resp:  [13-32] 17 (11/16 0900) BP: (76-120)/(54-101) 101/66 (11/16 0845) SpO2:  [91 %-100 %] 94 % (11/16 0900) Arterial Line BP: (67-115)/(49-81) 115/78 (11/16 0045) Weight:  [143.9 kg] 143.9 kg (11/16 0500) Last BM Date : 05/01/23 Mean arterial Pressure 80 mm Hg  Intake/Output:   Intake/Output Summary (Last 24 hours) at 05/05/2023 0900 Last data filed at 05/05/2023 0800 Gross per 24 hour  Intake 3289.49 ml  Output 3270 ml  Net 19.49 ml     Physical Exam: General:  Well appearing. No resp difficulty HEENT: normal Neck: supple. JVP . Carotids 2+ bilat; no bruits. No lymphadenopathy or thryomegaly appreciated. Cor: Mechanical heart sounds with LVAD hum present. Lungs: clear Abdomen: soft, nontender, nondistended. No hepatosplenomegaly. No bruits or masses. Good bowel sounds. Extremities: no cyanosis, clubbing, rash, edema Neuro: alert & orientedx3, cranial nerves grossly intact. moves all 4 extremities w/o difficulty. Affect pleasant  Telemetry: A-V pacing 80  Labs: Basic Metabolic Panel: Recent Labs  Lab 05/03/23 0400 05/03/23 0803 05/03/23 1505 05/03/23 1508 05/03/23 2057 05/03/23 2103 05/04/23 0309 05/04/23 0426 05/04/23 0915 05/04/23 1044 05/04/23 1654 05/04/23 1700 05/05/23 0438  NA 135   < > 139   < > 137   < > 136   < > 139 140 138 137 132*  K 4.0   < > 3.0*   < > 3.7   < > 4.1   < > 4.0 4.2 3.9 3.9  4.2  CL 96*   < > 104  --  105  --  105  --   --   --   --  105 102  CO2 27  --  24  --  24  --  20*  --   --   --   --  22 22  GLUCOSE 124*   < > 174*  --  164*  --  153*  --   --   --   --  166* 216*  BUN 25*   < > 20  --  18  --  16  --   --   --   --  13 14  CREATININE 1.60*   < > 1.38*  --  1.48*  --  1.42*  --   --   --   --  1.47* 1.01  CALCIUM 9.0  --  7.9*  --  8.2*  --  8.2*  --   --   --   --  8.4* 8.1*  MG 2.5*  --  2.1  --  2.6*  --   --   --   --   --   --  2.3 2.3  PHOS  --   --   --   --   --   --  3.7  --   --   --   --   --  2.7   < > = values in this interval not displayed.    Liver Function Tests: Recent Labs  Lab 05/04/23 0309 05/05/23 0438  AST 63* 43*  ALT 17 16  ALKPHOS 45 45  BILITOT 2.1* 2.4*  PROT 5.8* 5.7*  ALBUMIN 3.4* 3.1*   No results for input(s): "LIPASE", "AMYLASE" in the last 168 hours. No results for input(s): "AMMONIA" in the last 168 hours.  CBC: Recent Labs  Lab 05/03/23 1505 05/03/23 1508 05/03/23 2057 05/03/23 2103 05/04/23 0309 05/04/23 0426 05/04/23 0915 05/04/23 1044 05/04/23 1654 05/04/23 1700 05/05/23 0438  WBC 15.3*  --  15.9*  --  16.0*  --   --   --   --  17.4* 18.5*  NEUTROABS  --   --   --   --  13.1*  --   --   --   --   --  15.1*  HGB 11.3*   < > 9.9*   < > 9.9*   < > 8.8* 8.8* 8.5* 8.5* 8.0*  HCT 32.6*   < > 28.8*   < > 28.6*   < > 26.0* 26.0* 25.0* 25.5* 24.9*  MCV 93.4  --  92.6  --  94.1  --   --   --   --  94.8 99.2  PLT 135*  135*  --  152  --  163  --   --   --   --  150 132*   < > = values in this interval not displayed.    INR: Recent Labs  Lab 05/02/23 1909 05/03/23 1505 05/04/23 0309 05/05/23 0438  INR 1.8* 1.4* 1.3* 1.6*    Other results: EKG:   Imaging: DG Chest Port 1 View  Result Date: 05/05/2023 CLINICAL DATA:  52 year old male with history of left ventricular cyst device. EXAM: PORTABLE CHEST 1 VIEW COMPARISON:  Chest x-ray 05/04/2023. FINDINGS: Patient has been extubated. Right  internal jugular Cordis through which a Swan-Ganz catheter has been passed into the proximal right main pulmonary artery. Bilateral chest tubes are again noted, stable in position. Left-sided biventricular pacemaker/AICD with lead tips projecting over the expected location of the right atrium, right ventricle and overlying the lateral wall the left ventricle via the coronary sinus and coronary veins. Left ventricular cyst device in position projecting over the cardiac apex. Lung volumes are low. Opacity in the left lung base which may reflect atelectasis and/or consolidation. Probable small left pleural effusion. No appreciable pneumothorax. Mild crowding of the pulmonary vasculature, without frank pulmonary edema. Enlargement of the cardiopericardial silhouette, similar to the prior study. IMPRESSION: 1. Postoperative changes and support apparatus, as above. 2. Low lung volumes with probable atelectasis and/or consolidation in the left lung base with superimposed small left pleural effusion. Electronically Signed   By: Trudie Reed M.D.   On: 05/05/2023 08:23   DG Chest Port 1 View  Result Date: 05/04/2023 CLINICAL DATA:  Left ventricular assist device. EXAM: PORTABLE CHEST 1 VIEW COMPARISON:  May 03, 2023. FINDINGS: Endotracheal and nasogastric tubes are unchanged. Right internal jugular Swan-Ganz catheter is unchanged. Left-sided defibrillator is unchanged. Left ventricular assist device is again noted. Left-sided chest tube is noted without pneumothorax. Minimal bibasilar subsegmental atelectasis. IMPRESSION: Stable support apparatus as noted above. Minimal bibasilar subsegmental atelectasis. Electronically Signed   By: Lupita Raider M.D.   On: 05/04/2023 09:56   DG Chest Port 1 View  Result Date: 05/03/2023 CLINICAL DATA:  Intraop assess for pneumothorax EXAM: PORTABLE  CHEST 1 VIEW COMPARISON:  04/08/2023 FINDINGS: Interval sternotomy and partially visualized LVAD. Endotracheal tube tip is  about 6.9 cm superior to carina. Midline and probable right chest drainage catheters. Airspace disease at left base. Partially visualized left-sided pacing device. Right IJ catheter with tip visualized to the cavoatrial region. No discrete pneumothorax on limited view of the chest IMPRESSION: 1. Interval sternotomy with support apparatus as above. 2. Airspace disease at left base, probable atelectasis Electronically Signed   By: Jasmine Pang M.D.   On: 05/03/2023 18:39   DG Chest Port 1 View  Result Date: 05/03/2023 CLINICAL DATA:  LVAD EXAM: PORTABLE CHEST 1 VIEW COMPARISON:  04/08/2023, 05/03/2023 FINDINGS: Endotracheal tube tip is about 5.1 cm superior to the carina. Esophageal tube tip coursing towards diaphragm but poorly visible distally. Left-sided pacing device as before. Right IJ Swan-Ganz catheter tip directed to the right in the region of the main pulmonary artery. Post sternotomy changes, probable drainage catheter over the middle chest. Partially visualize LVAD at the ventricular apex. Probable drainage catheter over the right chest. Question tiny right apical pneumothorax. Airspace disease in the left mid lung and lung base. Subsegmental atelectasis right lower lung. IMPRESSION: 1. Endotracheal tube tip about 5.1 cm superior to the carina. 2. Esophageal tube tip coursing towards diaphragm but poorly visible distally. 3. Other support lines and tubes as above. Partially imaged LVAD at the left ventricular apex. 4. Question tiny right apical pneumothorax, attention on follow-up imaging. 5. Stable mild cardiomegaly. Airspace disease in the left mid lung and lung base, probable atelectasis. No pneumothorax is seen Electronically Signed   By: Jasmine Pang M.D.   On: 05/03/2023 18:35   EP STUDY  Result Date: 05/03/2023 See surgical note for result.    Medications:     Scheduled Medications:  sodium chloride   Intravenous Once   acetaminophen  1,000 mg Oral Q6H   Or   acetaminophen  (TYLENOL) oral liquid 160 mg/5 mL  1,000 mg Per Tube Q6H   allopurinol  100 mg Oral Daily   amiodarone  200 mg Oral BID   aspirin EC  325 mg Oral Daily   Or   aspirin  324 mg Per Tube Daily   Or   aspirin  300 mg Rectal Daily   bisacodyl  10 mg Oral Daily   Or   bisacodyl  10 mg Rectal Daily   Chlorhexidine Gluconate Cloth  6 each Topical Daily   escitalopram  10 mg Oral QHS   ezetimibe  10 mg Oral Daily   feeding supplement  237 mL Oral BID BM   gabapentin  300 mg Oral TID   insulin aspart  0-24 Units Subcutaneous Q4H   insulin aspart  6 Units Subcutaneous TID WC   insulin detemir  30 Units Subcutaneous BID   levothyroxine  25 mcg Oral Daily   mexiletine  250 mg Oral Q12H   mouth rinse  15 mL Mouth Rinse Q4H   rosuvastatin  40 mg Oral Daily   sodium chloride flush  10-40 mL Intracatheter Q12H   sodium chloride flush  3 mL Intravenous Q12H   sodium chloride flush  3 mL Intravenous Q12H   sodium chloride flush  3 mL Intravenous Q12H   sodium chloride flush  3 mL Intravenous Q8H    Infusions:   ceFAZolin (ANCEF) IV Stopped (05/05/23 0538)   epinephrine 7 mcg/min (05/05/23 0700)   lactated ringers     milrinone 0.25 mcg/kg/min (05/05/23 0700)  norepinephrine (LEVOPHED) Adult infusion Stopped (05/05/23 0617)   vasopressin 0.03 Units/min (05/05/23 0700)    PRN Medications: acetaminophen, dextrose, midazolam, morphine injection, ondansetron (ZOFRAN) IV, mouth rinse, mouth rinse, oxyCODONE, polyvinyl alcohol, sodium chloride flush, sodium chloride flush, sodium chloride flush, traMADol   Assessment:  HM 3 implant 11-14 DM Morbid obesity   Plan/Discussion:   DC swan and mobilize OOB to chair Leave chest tubes Repeat coox  after transfusion   I reviewed the LVAD parameters from today, and compared the results to the patient's prior recorded data.  No programming changes were made.  The LVAD is functioning within specified parameters.  The patient performs LVAD self-test  daily.  LVAD interrogation was negative for any significant power changes, alarms or PI events/speed drops.  LVAD equipment check completed and is in good working order.  Back-up equipment present.   LVAD education done on emergency procedures and precautions and reviewed exit site care.  Length of Stay: 5  Lovett Sox 05/05/2023, 9:00 AM

## 2023-05-05 NOTE — Progress Notes (Signed)
Advanced Heart Failure VAD Team Note  PCP-Cardiologist: [name], MD   Subjective:    11/14: s/p HM3 LVAD implantation  Post op day #2  Now on VP. Epi 7 NE off. Milrinone 0.25 Co-ox 57%, 56%  Hgb 8.0 Bili 24   Has had 2 BMs  PAP: (16-38)/(2-21) 33/15 CVP:  [0 mmHg-21 mmHg] 14 mmHg CO:  [6.6 L/min-8.4 L/min] 8.4 L/min CI:  [2.51 L/min/m2-3.21 L/min/m2] 3.21 L/min/m2    LVAD INTERROGATION:  HeartMate HM3 LVAD:   Flow 5.3 liters/min, speed 5600, Power 5.0, PI 3.5.   Objective:    Vital Signs:   Temp:  [98.4 F (36.9 C)-101.1 F (38.4 C)] 98.6 F (37 C) (11/16 1145) Pulse Rate:  [78-176] 84 (11/16 1145) Resp:  [13-32] 25 (11/16 1145) BP: (76-141)/(54-128) 103/86 (11/16 1115) SpO2:  [79 %-100 %] 98 % (11/16 1145) Arterial Line BP: (67-115)/(49-81) 115/78 (11/16 0045) Weight:  [143.9 kg] 143.9 kg (11/16 0500) Last BM Date : 05/05/23  Mean arterial Pressure 80-90s  Intake/Output:   Intake/Output Summary (Last 24 hours) at 05/05/2023 1148 Last data filed at 05/05/2023 1106 Gross per 24 hour  Intake 3650.02 ml  Output 3220 ml  Net 430.02 ml   Physical Exam    General: Sitting up in bed  NAD.  HEENT: normal  Neck: supple. JVP not elevated.  Carotids 2+ bilat; no bruits. No lymphadenopathy or thryomegaly appreciated. Cor: LVAD hum. + sternal dressing and CTs   Lungs: Clear. coarse Abdomen: obese soft, nontender, non-distended. No hepatosplenomegaly. No bruits or masses. + bowel sounds. Driveline site clean. Anchor in place.  Extremities: no cyanosis, clubbing, rash. Warm 2+ edema  Neuro: alert & oriented x 3. No focal deficits. Moves all 4 without problem    Telemetry   AV paced 80 Personally reviewed   Labs   Basic Metabolic Panel: Recent Labs  Lab 05/03/23 0400 05/03/23 0803 05/03/23 1505 05/03/23 1508 05/03/23 2057 05/03/23 2103 05/04/23 0309 05/04/23 0426 05/04/23 0915 05/04/23 1044 05/04/23 1654 05/04/23 1700 05/05/23 0438   NA 135   < > 139   < > 137   < > 136   < > 139 140 138 137 132*  K 4.0   < > 3.0*   < > 3.7   < > 4.1   < > 4.0 4.2 3.9 3.9 4.2  CL 96*   < > 104  --  105  --  105  --   --   --   --  105 102  CO2 27  --  24  --  24  --  20*  --   --   --   --  22 22  GLUCOSE 124*   < > 174*  --  164*  --  153*  --   --   --   --  166* 216*  BUN 25*   < > 20  --  18  --  16  --   --   --   --  13 14  CREATININE 1.60*   < > 1.38*  --  1.48*  --  1.42*  --   --   --   --  1.47* 1.01  CALCIUM 9.0  --  7.9*  --  8.2*  --  8.2*  --   --   --   --  8.4* 8.1*  MG 2.5*  --  2.1  --  2.6*  --   --   --   --   --   --  2.3 2.3  PHOS  --   --   --   --   --   --  3.7  --   --   --   --   --  2.7   < > = values in this interval not displayed.    Liver Function Tests: Recent Labs  Lab 05/04/23 0309 05/05/23 0438  AST 63* 43*  ALT 17 16  ALKPHOS 45 45  BILITOT 2.1* 2.4*  PROT 5.8* 5.7*  ALBUMIN 3.4* 3.1*   No results for input(s): "LIPASE", "AMYLASE" in the last 168 hours. No results for input(s): "AMMONIA" in the last 168 hours.  CBC: Recent Labs  Lab 05/03/23 1505 05/03/23 1508 05/03/23 2057 05/03/23 2103 05/04/23 0309 05/04/23 0426 05/04/23 0915 05/04/23 1044 05/04/23 1654 05/04/23 1700 05/05/23 0438  WBC 15.3*  --  15.9*  --  16.0*  --   --   --   --  17.4* 18.5*  NEUTROABS  --   --   --   --  13.1*  --   --   --   --   --  15.1*  HGB 11.3*   < > 9.9*   < > 9.9*   < > 8.8* 8.8* 8.5* 8.5* 8.0*  HCT 32.6*   < > 28.8*   < > 28.6*   < > 26.0* 26.0* 25.0* 25.5* 24.9*  MCV 93.4  --  92.6  --  94.1  --   --   --   --  94.8 99.2  PLT 135*  135*  --  152  --  163  --   --   --   --  150 132*   < > = values in this interval not displayed.    INR: Recent Labs  Lab 05/02/23 1909 05/03/23 1505 05/04/23 0309 05/05/23 0438  INR 1.8* 1.4* 1.3* 1.6*    Other results:   Imaging   DG Chest Port 1 View  Result Date: 05/05/2023 CLINICAL DATA:  52 year old male with history of left ventricular  cyst device. EXAM: PORTABLE CHEST 1 VIEW COMPARISON:  Chest x-ray 05/04/2023. FINDINGS: Patient has been extubated. Right internal jugular Cordis through which a Swan-Ganz catheter has been passed into the proximal right main pulmonary artery. Bilateral chest tubes are again noted, stable in position. Left-sided biventricular pacemaker/AICD with lead tips projecting over the expected location of the right atrium, right ventricle and overlying the lateral wall the left ventricle via the coronary sinus and coronary veins. Left ventricular cyst device in position projecting over the cardiac apex. Lung volumes are low. Opacity in the left lung base which may reflect atelectasis and/or consolidation. Probable small left pleural effusion. No appreciable pneumothorax. Mild crowding of the pulmonary vasculature, without frank pulmonary edema. Enlargement of the cardiopericardial silhouette, similar to the prior study. IMPRESSION: 1. Postoperative changes and support apparatus, as above. 2. Low lung volumes with probable atelectasis and/or consolidation in the left lung base with superimposed small left pleural effusion. Electronically Signed   By: Trudie Reed M.D.   On: 05/05/2023 08:23   DG Chest Port 1 View  Result Date: 05/04/2023 CLINICAL DATA:  Left ventricular assist device. EXAM: PORTABLE CHEST 1 VIEW COMPARISON:  May 03, 2023. FINDINGS: Endotracheal and nasogastric tubes are unchanged. Right internal jugular Swan-Ganz catheter is unchanged. Left-sided defibrillator is unchanged. Left ventricular assist device is again noted. Left-sided chest tube is noted without pneumothorax. Minimal bibasilar subsegmental atelectasis. IMPRESSION: Stable support apparatus as noted above.  Minimal bibasilar subsegmental atelectasis. Electronically Signed   By: Lupita Raider M.D.   On: 05/04/2023 09:56   DG Chest Port 1 View  Result Date: 05/03/2023 CLINICAL DATA:  Intraop assess for pneumothorax EXAM: PORTABLE  CHEST 1 VIEW COMPARISON:  04/08/2023 FINDINGS: Interval sternotomy and partially visualized LVAD. Endotracheal tube tip is about 6.9 cm superior to carina. Midline and probable right chest drainage catheters. Airspace disease at left base. Partially visualized left-sided pacing device. Right IJ catheter with tip visualized to the cavoatrial region. No discrete pneumothorax on limited view of the chest IMPRESSION: 1. Interval sternotomy with support apparatus as above. 2. Airspace disease at left base, probable atelectasis Electronically Signed   By: Jasmine Pang M.D.   On: 05/03/2023 18:39   DG Chest Port 1 View  Result Date: 05/03/2023 CLINICAL DATA:  LVAD EXAM: PORTABLE CHEST 1 VIEW COMPARISON:  04/08/2023, 05/03/2023 FINDINGS: Endotracheal tube tip is about 5.1 cm superior to the carina. Esophageal tube tip coursing towards diaphragm but poorly visible distally. Left-sided pacing device as before. Right IJ Swan-Ganz catheter tip directed to the right in the region of the main pulmonary artery. Post sternotomy changes, probable drainage catheter over the middle chest. Partially visualize LVAD at the ventricular apex. Probable drainage catheter over the right chest. Question tiny right apical pneumothorax. Airspace disease in the left mid lung and lung base. Subsegmental atelectasis right lower lung. IMPRESSION: 1. Endotracheal tube tip about 5.1 cm superior to the carina. 2. Esophageal tube tip coursing towards diaphragm but poorly visible distally. 3. Other support lines and tubes as above. Partially imaged LVAD at the left ventricular apex. 4. Question tiny right apical pneumothorax, attention on follow-up imaging. 5. Stable mild cardiomegaly. Airspace disease in the left mid lung and lung base, probable atelectasis. No pneumothorax is seen Electronically Signed   By: Jasmine Pang M.D.   On: 05/03/2023 18:35   EP STUDY  Result Date: 05/03/2023 See surgical note for result.   Medications:     Scheduled Medications:  acetaminophen  1,000 mg Oral Q6H   Or   acetaminophen (TYLENOL) oral liquid 160 mg/5 mL  1,000 mg Per Tube Q6H   allopurinol  100 mg Oral Daily   amiodarone  200 mg Oral BID   aspirin EC  325 mg Oral Daily   Or   aspirin  324 mg Per Tube Daily   Or   aspirin  300 mg Rectal Daily   bisacodyl  10 mg Oral Daily   Or   bisacodyl  10 mg Rectal Daily   Chlorhexidine Gluconate Cloth  6 each Topical Daily   escitalopram  10 mg Oral QHS   ezetimibe  10 mg Oral Daily   feeding supplement  237 mL Oral BID BM   gabapentin  300 mg Oral TID   insulin aspart  0-24 Units Subcutaneous Q4H   insulin aspart  6 Units Subcutaneous TID WC   insulin detemir  30 Units Subcutaneous BID   levothyroxine  25 mcg Oral Daily   mexiletine  250 mg Oral Q12H   mouth rinse  15 mL Mouth Rinse Q4H   rosuvastatin  40 mg Oral Daily   sodium chloride flush  10-40 mL Intracatheter Q12H   sodium chloride flush  3 mL Intravenous Q12H   sodium chloride flush  3 mL Intravenous Q12H   sodium chloride flush  3 mL Intravenous Q12H   sodium chloride flush  3 mL Intravenous Q8H    Infusions:  ceFAZolin (ANCEF) IV Stopped (05/05/23 0538)   epinephrine 7 mcg/min (05/05/23 1000)   lactated ringers     milrinone 0.25 mcg/kg/min (05/05/23 1025)   norepinephrine (LEVOPHED) Adult infusion Stopped (05/05/23 0617)   vasopressin 0.03 Units/min (05/05/23 1000)    PRN Medications: acetaminophen, dextrose, midazolam, morphine injection, ondansetron (ZOFRAN) IV, mouth rinse, mouth rinse, oxyCODONE, polyvinyl alcohol, sodium chloride flush, sodium chloride flush, sodium chloride flush, traMADol  Patient Profile    Joe [name] is a 52 y.o. male with h/o morbid obesity, DM2, HTN, HL, OSA, CAD and systolic HF. Now s/p HM3 LVAD Insertion 11/14 by Dt. Vantrigt.  Assessment/Plan:    1.  Chronic Systolic HF s/p HM3 Insertion 16/10 - due to iCM - Echo (8/24): EF 20-25%, RV ok. - Echo 05/01/23: EF 20%,  RV moderately down (improved) - R/LHC (9/24): stable 3v CAD, elevated biventricular filling pressures with low PAPi; CI 2.2 - RHC (11/24): RA 9, PA mean 28, PCWP 19, Fick CI 1.9, TD CI 2.2, PVR 1.6, PAPi 2.4 - S/p HM III LVAD 05/03/23 by Dr. Maren Beach - Received FFP x6, Cryo x2, PLT x1, and DDAVP in OR - Intra-op TEE - EF 20-25%, RV moderately reduced, IV septum with mild rightward bowing, AV opens intermittently - Remains on VP 0.03, Epi 7,  milrinone 025. NE off - Co-ox 57% CVP 14 PAPi 1.6  - VAD interrogated personally. Parameters stable. I turned VAD speed up to 6000 - Start gentle diuresis - Continue inotrope/pressor support. Keep MAP 70-90    2. H/o VT Arrest  Hx PVCs - Followed by Dr. Elberta Fortis - Admitted 10/10-10/13/24 w/ VF treated w/ ICD shock>>loaded w/ IV amio  - Re-admitted 04/08/23 w/ VT arrest, treated w/ appropriate ICD shock x 2 + brief bystander CPR  - in setting of underling CM w/ EF < 35% + intermittent hypokalemia  - Continue  Amio 200 mg bid and mexiletine 250 mg bid   3. CAD with chronic stable angina - s/p multiple stents to LAD and LCX - Cath 9/22 stable CAD - LHC (9/24) with stable 3v CAD - Eventually restart Crestor 40mg  daily - Continue ASA. - No s/s angina  4. AKI  - SCr 1.5 (baseline 1.1) - Keep off SGLT2i  - Avoid hypotension - Post-op sCr 1.38 -> 1.01 - Follow BMET   5. DM2 - A1c 7.2 (1/24) - Insulin gtt per TCTS   6. OSA - Not using CPAP, says machine was recalled a while ago. Needs new machine. - Followed outpatient   7. Obesity - Body mass index is 37.62 kg/m.  CRITICAL CARE Performed by: Arvilla Meres  Total critical care time: 45 minutes  Critical care time was exclusive of separately billable procedures and treating other patients.  Critical care was necessary to treat or prevent imminent or life-threatening deterioration.  Critical care was time spent personally by me (independent of midlevel providers or residents) on  the following activities: development of treatment plan with patient and/or surrogate as well as nursing, discussions with consultants, evaluation of patient's response to treatment, examination of patient, obtaining history from patient or surrogate, ordering and performing treatments and interventions, ordering and review of laboratory studies, ordering and review of radiographic studies, pulse oximetry and re-evaluation of patient's condition.   I reviewed the LVAD parameters from today, and compared the results to the patient's prior recorded data.  No programming changes were made.  The LVAD is functioning within specified parameters.  The patient performs LVAD self-test daily.  LVAD interrogation was negative for any significant power changes, alarms or PI events/speed drops.  LVAD equipment check completed and is in good working order.  Back-up equipment present.   LVAD education done on emergency procedures and precautions and reviewed exit site care.  Length of Stay: 5  Arvilla Meres, MD 05/05/2023, 11:48 AM  VAD Team --- VAD ISSUES ONLY--- Pager [phone] (7am - 7am)  Advanced Heart Failure Team  Pager [phone] (M-F; 7a - 5p)  Please contact [facility] for night-coverage after hours (5p -7a ) and weekends on [email or web address]

## 2023-05-05 NOTE — Plan of Care (Signed)
  Problem: Cardiovascular: Goal: Ability to achieve and maintain adequate cardiovascular perfusion will improve Outcome: Progressing Goal: Vascular access site(s) Level 0-1 will be maintained Outcome: Progressing   Problem: Clinical Measurements: Goal: Ability to maintain clinical measurements within normal limits will improve Outcome: Progressing Goal: Will remain free from infection Outcome: Progressing Goal: Diagnostic test results will improve Outcome: Progressing Goal: Respiratory complications will improve Outcome: Progressing Goal: Cardiovascular complication will be avoided Outcome: Progressing   Problem: Activity: Goal: Risk for activity intolerance will decrease Outcome: Progressing   Problem: Coping: Goal: Level of anxiety will decrease Outcome: Progressing   Problem: Elimination: Goal: Will not experience complications related to bowel motility Outcome: Progressing Goal: Will not experience complications related to urinary retention Outcome: Progressing   Problem: Pain Management: Goal: General experience of comfort will improve Outcome: Progressing   Problem: Safety: Goal: Ability to remain free from injury will improve Outcome: Progressing   Problem: Skin Integrity: Goal: Risk for impaired skin integrity will decrease Outcome: Progressing

## 2023-05-05 NOTE — Progress Notes (Signed)
Arterial line noted to be increasingly positional at beginning of shift compared to previous night. Having difficulty drawing blood from line & maintaining accurate waveform. Issue progressively worsened as night went on, eventually losing all pulsatility in tracing. Unable to pull back any blood despite frequent troubleshooting. RT came to bedside to redress line--unable to fix. Arterial line pulled, site without any blood draining s/p removal & catheter appeared to be kinked.

## 2023-05-06 ENCOUNTER — Other Ambulatory Visit: Payer: Self-pay

## 2023-05-06 ENCOUNTER — Inpatient Hospital Stay (HOSPITAL_COMMUNITY): Payer: 59

## 2023-05-06 DIAGNOSIS — I255 Ischemic cardiomyopathy: Secondary | ICD-10-CM | POA: Diagnosis not present

## 2023-05-06 DIAGNOSIS — Z515 Encounter for palliative care: Secondary | ICD-10-CM | POA: Diagnosis not present

## 2023-05-06 DIAGNOSIS — I509 Heart failure, unspecified: Secondary | ICD-10-CM | POA: Diagnosis not present

## 2023-05-06 LAB — TYPE AND SCREEN
ABO/RH(D): O NEG
Antibody Screen: NEGATIVE
Unit division: 0
Unit division: 0
Unit division: 0

## 2023-05-06 LAB — BPAM RBC
Blood Product Expiration Date: 202411212359
Blood Product Expiration Date: 202411242359
Blood Product Expiration Date: 202411272359
Blood Product Unit Number: 202411272359
ISSUE DATE / TIME: 202411140827
ISSUE DATE / TIME: 202411140827
ISSUE DATE / TIME: 202411140827
ISSUE DATE / TIME: 202411160912
ISSUE DATE / TIME: 202411212359
PRODUCT CODE: 202411242359
Unit Type and Rh: 202411140827
Unit Type and Rh: 9500
Unit Type and Rh: 9500
Unit Type and Rh: 9500
Unit Type and Rh: 9500

## 2023-05-06 LAB — GLUCOSE, CAPILLARY
Glucose-Capillary: 190 mg/dL — ABNORMAL HIGH (ref 70–99)
Glucose-Capillary: 184 mg/dL — ABNORMAL HIGH (ref 70–99)
Glucose-Capillary: 203 mg/dL — ABNORMAL HIGH (ref 70–99)
Glucose-Capillary: 171 mg/dL — ABNORMAL HIGH (ref 70–99)
Glucose-Capillary: 160 mg/dL — ABNORMAL HIGH (ref 70–99)
Glucose-Capillary: 186 mg/dL — ABNORMAL HIGH (ref 70–99)

## 2023-05-06 LAB — CBC WITH DIFFERENTIAL/PLATELET
Abs Immature Granulocytes: 0.14 10*3/uL — ABNORMAL HIGH (ref 0.00–0.07)
Basophils Absolute: 0.1 10*3/uL (ref 0.0–0.1)
Basophils Relative: 0 %
Eosinophils Absolute: 0.1 10*3/uL (ref 0.0–0.5)
Eosinophils Relative: 0 %
HCT: 25.8 % — ABNORMAL LOW (ref 39.0–52.0)
Hemoglobin: 8.3 g/dL — ABNORMAL LOW (ref 13.0–17.0)
Immature Granulocytes: 1 %
Lymphocytes Relative: 6 %
Lymphs Abs: 1 10*3/uL (ref 0.7–4.0)
MCH: 31.2 pg (ref 26.0–34.0)
MCHC: 32.2 g/dL (ref 30.0–36.0)
MCV: 97 fL (ref 80.0–100.0)
Monocytes Absolute: 1.5 10*3/uL — ABNORMAL HIGH (ref 0.1–1.0)
Monocytes Relative: 9 %
Neutro Abs: 13.6 10*3/uL — ABNORMAL HIGH (ref 1.7–7.7)
Neutrophils Relative %: 84 %
Platelets: 149 10*3/uL — ABNORMAL LOW (ref 150–400)
RBC: 2.66 MIL/uL — ABNORMAL LOW (ref 4.22–5.81)
RDW: 17.4 % — ABNORMAL HIGH (ref 11.5–15.5)
WBC: 16.3 10*3/uL — ABNORMAL HIGH (ref 4.0–10.5)
nRBC: 0.2 % (ref 0.0–0.2)

## 2023-05-06 LAB — COMPREHENSIVE METABOLIC PANEL
ALT: 17 U/L (ref 0–44)
AST: 34 U/L (ref 15–41)
Albumin: 3 g/dL — ABNORMAL LOW (ref 3.5–5.0)
Alkaline Phosphatase: 53 U/L (ref 38–126)
Anion gap: 8 (ref 5–15)
BUN: 18 mg/dL (ref 6–20)
CO2: 27 mmol/L (ref 22–32)
Calcium: 8.8 mg/dL — ABNORMAL LOW (ref 8.9–10.3)
Chloride: 100 mmol/L (ref 98–111)
Creatinine, Ser: 0.99 mg/dL (ref 0.61–1.24)
GFR, Estimated: >60 mL/min (ref >60)
Glucose, Bld: 198 mg/dL — ABNORMAL HIGH (ref 70–99)
Potassium: 4.3 mmol/L (ref 3.5–5.1)
Sodium: 135 mmol/L (ref 135–145)
Total Bilirubin: 2.2 mg/dL — ABNORMAL HIGH (ref <1.2)
Total Protein: 5.9 g/dL — ABNORMAL LOW (ref 6.5–8.1)

## 2023-05-06 LAB — COOXEMETRY PANEL
Carboxyhemoglobin: 2 % — ABNORMAL HIGH (ref 0.5–1.5)
Carboxyhemoglobin: 1.5 % (ref 0.5–1.5)
Methemoglobin: <0.7 % (ref 0.0–1.5)
Methemoglobin: <0.7 % (ref 0.0–1.5)
O2 Saturation: 52.9 %
O2 Saturation: 52.4 %
Total hemoglobin: 9 g/dL — ABNORMAL LOW (ref 12.0–16.0)
Total hemoglobin: 8.7 g/dL — ABNORMAL LOW (ref 12.0–16.0)

## 2023-05-06 LAB — MAGNESIUM: Magnesium: 2.2 mg/dL (ref 1.7–2.4)

## 2023-05-06 LAB — LACTATE DEHYDROGENASE: LDH: 235 U/L — ABNORMAL HIGH (ref 98–192)

## 2023-05-06 LAB — PROTIME-INR
INR: 1.4 — ABNORMAL HIGH (ref 0.8–1.2)
Prothrombin Time: 17.3 s — ABNORMAL HIGH (ref 11.4–15.2)

## 2023-05-06 LAB — APTT: aPTT: 38 s — ABNORMAL HIGH (ref 24–36)

## 2023-05-06 LAB — PHOSPHORUS: Phosphorus: 2.5 mg/dL (ref 2.5–4.6)

## 2023-05-06 MED ORDER — FUROSEMIDE 10 MG/ML IJ SOLN
4.0000 mg/h | INTRAVENOUS | Status: DC
  Administered 2023-05-06 – 2023-05-07 (×2): 8 mg/h via INTRAVENOUS
  Filled 2023-05-06 (×2): qty 20

## 2023-05-06 MED ORDER — FUROSEMIDE 10 MG/ML IJ SOLN
40.0000 mg | Freq: Once | INTRAMUSCULAR | Status: AC
  Administered 2023-05-06: 40 mg via INTRAVENOUS
  Filled 2023-05-06: qty 4

## 2023-05-06 MED ORDER — INSULIN DETEMIR 100 UNIT/ML ~~LOC~~ SOLN
35.0000 [IU] | Freq: Two times a day (BID) | SUBCUTANEOUS | Status: DC
  Administered 2023-05-06 – 2023-05-07 (×3): 35 [IU] via SUBCUTANEOUS
  Filled 2023-05-06 (×4): qty 0.35

## 2023-05-06 MED ORDER — GABAPENTIN 400 MG PO CAPS
400.0000 mg | ORAL_CAPSULE | Freq: Three times a day (TID) | ORAL | Status: DC
  Administered 2023-05-06 – 2023-05-21 (×45): 400 mg via ORAL
  Filled 2023-05-06 (×45): qty 1

## 2023-05-06 MED ORDER — WARFARIN - PHYSICIAN DOSING INPATIENT: Freq: Every day | Status: DC

## 2023-05-06 MED ORDER — ORAL CARE MOUTH RINSE: 15.0000 mL | OROMUCOSAL | Status: DC | PRN

## 2023-05-06 MED ORDER — METOCLOPRAMIDE HCL 5 MG/ML IJ SOLN
10.0000 mg | Freq: Four times a day (QID) | INTRAMUSCULAR | Status: DC
  Administered 2023-05-06 – 2023-05-09 (×13): 10 mg via INTRAVENOUS
  Filled 2023-05-06 (×13): qty 2

## 2023-05-06 MED ORDER — WARFARIN SODIUM 5 MG PO TABS
5.0000 mg | ORAL_TABLET | Freq: Once | ORAL | Status: AC
  Administered 2023-05-06: 5 mg via ORAL
  Filled 2023-05-06: qty 1

## 2023-05-06 NOTE — Evaluation (Signed)
Physical Therapy Evaluation Patient Details Name: Joe [name] MRN: [id number] DOB: Mar 18, 1971 Today's Date: 05/06/2023  History of Present Illness  Pt is a 52yo male who underwent LVAD placement on 11/14.  PMH: morbid obesity, DM2, HTN, HL, OSA, CAD and systolic HF (EF 20-25%)   Clinical Impression  Pt underwent LVAD placement on 11/14. Continues to have swan line in but was cleared for OOB to chair. Pt mobilized very well for first time transferring to chair. At this time pt requiring modA for transfers and is dependent for hygiene post BM and LB dressing. Anticipate pt to progress well. At this time recommending inpatient rehab program > 3hrs a day post d/c to progress indep mobility as pt was PTA. Acute PT to cont to follow.    Flow: 5.9 litters/min Speed 6000 Power 4.9 PI 3.7      If plan is discharge home, recommend the following: A little help with walking and/or transfers;A little help with bathing/dressing/bathroom;Assist for transportation;Assistance with cooking/housework   Can travel by private vehicle        Equipment Recommendations  (has walker, hospital bed at home)  Recommendations for Other Services  Rehab consult    Functional Status Assessment Patient has had a recent decline in their functional status and demonstrates the ability to make significant improvements in function in a reasonable and predictable amount of time.     Precautions / Restrictions Precautions Precautions: Sternal Precaution Booklet Issued: No Precaution Comments: pt reports "they told me not to use my arms". educated on "staying in a tube" Restrictions Weight Bearing Restrictions: Yes RUE Weight Bearing: Non weight bearing LUE Weight Bearing: Non weight bearing Other Position/Activity Restrictions: sternal precautions      Mobility  Bed Mobility Overal bed mobility: Needs Assistance Bed Mobility: Supine to Sit     Supine to sit: Mod assist, HOB elevated      General bed mobility comments: HOB elvated to 50 degrees, pt able to bring LEs to EOB, pt hugged heart pillow rolled to the R onto R elbow, modA for trunk elevation and to scoot to EOB    Transfers Overall transfer level: Needs assistance Equipment used: 1 person hand held assist Transfers: Sit to/from Stand, Bed to chair/wheelchair/BSC Sit to Stand: Mod assist, From elevated surface, +2 safety/equipment   Step pivot transfers: Mod assist, +2 safety/equipment       General transfer comment: 2nd person for line management, noted mild difficulty clearing L foot during stepping to chair but no overt knee buckling    Ambulation/Gait               General Gait Details: unable due to Engineer, production     Tilt Bed    Modified Rankin (Stroke Patients Only)       Balance Overall balance assessment: Needs assistance Sitting-balance support: Feet supported, No upper extremity supported Sitting balance-Leahy Scale: Fair     Standing balance support: No upper extremity supported, During functional activity Standing balance-Leahy Scale: Poor Standing balance comment: dependent on external support                             Pertinent Vitals/Pain Pain Assessment Pain Assessment: Faces Pain Location: L flank below breast Pain Descriptors / Indicators: Sharp Pain Intervention(s): Monitored during session    Home Living Family/patient expects to be discharged to:: Private residence  Living Arrangements: Spouse/significant other Available Help at Discharge: Family;Available 24 hours/day Type of Home: House Home Access: Ramped entrance       Home Layout: One level Home Equipment: Shower seat;Hospital bed;Other (comment) (tall rollator) Additional Comments: recently built a fully handicapped accessible home    Prior Function Prior Level of Function : Independent/Modified Independent             Mobility Comments:  no AD ADLs Comments: indep     Extremity/Trunk Assessment   Upper Extremity Assessment Upper Extremity Assessment: Defer to OT evaluation    Lower Extremity Assessment Lower Extremity Assessment: Generalized weakness    Cervical / Trunk Assessment Cervical / Trunk Assessment: Other exceptions Cervical / Trunk Exceptions: sternal incision, chest tubes, LVAD  Communication   Communication Communication: No apparent difficulties  Cognition Arousal: Alert Behavior During Therapy: Flat affect Overall Cognitive Status: Within Functional Limits for tasks assessed                                          General Comments General comments (skin integrity, edema, etc.): pt VSS, mild LE edema, pt with episode of loose stool incontinence upon standing, dependent for hygiene    Exercises General Exercises - Lower Extremity Ankle Circles/Pumps: AROM, Both, 10 reps, Seated Long Arc Quad: AROM, Left, 10 reps, Seated Hip Flexion/Marching: AROM, Both, 5 reps, Seated   Assessment/Plan    PT Assessment Patient needs continued PT services  PT Problem List Decreased strength;Decreased range of motion;Decreased activity tolerance;Decreased balance;Decreased mobility;Decreased knowledge of use of DME;Cardiopulmonary status limiting activity       PT Treatment Interventions DME instruction;Gait training;Functional mobility training;Therapeutic activities;Balance training;Therapeutic exercise;Patient/family education    PT Goals (Current goals can be found in the Care Plan section)  Acute Rehab PT Goals Patient Stated Goal: home PT Goal Formulation: With patient/family Time For Goal Achievement: 05/20/23 Potential to Achieve Goals: Good    Frequency Min 1X/week     Co-evaluation               AM-PAC PT "6 Clicks" Mobility  Outcome Measure Help needed turning from your back to your side while in a flat bed without using bedrails?: A Lot Help needed moving from  lying on your back to sitting on the side of a flat bed without using bedrails?: A Lot Help needed moving to and from a bed to a chair (including a wheelchair)?: A Lot Help needed standing up from a chair using your arms (e.g., wheelchair or bedside chair)?: A Lot Help needed to walk in hospital room?: A Lot Help needed climbing 3-5 steps with a railing? : A Lot 6 Click Score: 12    End of Session Equipment Utilized During Treatment: Oxygen Activity Tolerance: Patient tolerated treatment well Patient left: in chair;with nursing/sitter in room;with family/visitor present;with call bell/phone within reach (sitting EOB) Nurse Communication: Mobility status (RN present during session) PT Visit Diagnosis: Other abnormalities of gait and mobility (R26.89);Muscle weakness (generalized) (M62.81)    Time: 1610-9604 PT Time Calculation (min) (ACUTE ONLY): 36 min   Charges:   PT Evaluation $PT Re-evaluation: 1 Re-eval PT Treatments $Therapeutic Activity: 8-22 mins PT General Charges $$ ACUTE PT VISIT: 1 Visit         [name], PT, DPT Acute Rehabilitation Services Secure chat preferred Office #: [phone]   [name] 05/06/2023, 10:44 AM

## 2023-05-06 NOTE — Progress Notes (Addendum)
   Palliative Medicine Inpatient Follow Up Note HPI: Joe [name] is a 52 y.o. male with medical history significant for chronic systolic heart failure in setting of ischemic cardiomyopathy, V-fib arrest, hyperlipidemia, obstructive sleep apnea on home nocturnal CPAP, type 2 diabetes mellitus. The Palliative medicine team has been asked to assist in VAD evaluation.   Today's Discussion 05/06/2023  *Please note that this is a verbal dictation therefore any spelling or grammatical errors are due to the "Dragon Medical One" system interpretation.  Chart reviewed inclusive of vital signs, progress notes, laboratory results, and diagnostic images.   I met with Joe [name] and his spouse, Joe [name] at bedside this morning. Joe [name] is now LVAD POD #3 in [facility] ICU. He has bilateral chest tubes. He shares the only complaint he has this morning is sharp pain under his "left boob". He expresses it is constant. Patients RN, Geraldo Pitter Is aware and plans to inform the primary team.  Joe [name] is getting ready to work with physical therapy otherwise. He is motivated to get moving. We reviewed the importance of mobility efforts to decrease recovery time.   Palliative Support Provided.   Objective Assessment: Vital Signs Vitals:   05/06/23 0745 05/06/23 0800  BP: (!) 82/62   Pulse: 98 (!) 139  Resp: (!) 22 20  Temp: 97.9 F (36.6 C) 97.7 F (36.5 C)  SpO2: 95% 95%    Intake/Output Summary (Last 24 hours) at 05/06/2023 0930 Last data filed at 05/06/2023 0700 Gross per 24 hour  Intake 1775.26 ml  Output 2520 ml  Net -744.74 ml   Last Weight  Most recent update: 05/06/2023  6:56 AM    Weight  143.9 kg (317 lb 3.9 oz)                SUMMARY OF RECOMMENDATIONS   Full Code / Full Scope of Care   S/P LVAD POD #3   Appreciate Chaplain support   PMT will continue to follow along incrementally  Time Spent:  25 ______________________________________________________________________________________ Joe [name] [place] Palliative Medicine Team Team Cell Phone: [phone] Please utilize secure chat with additional questions, if there is no response within 30 minutes please call the above phone number  Palliative Medicine Team providers are available by phone from 7am to 7pm daily and can be reached through the team cell phone.  Should this patient require assistance outside of these hours, please call the patient's attending physician.

## 2023-05-06 NOTE — Progress Notes (Signed)
3 Days Post-Op Procedure(s) (LRB): INSERTION OF IMPLANTABLE LEFT VENTRICULAR ASSIST DEVICE - HEARTMATE 3 (N/A) TRANSESOPHAGEAL ECHOCARDIOGRAM (N/A) Subjective:  No complaints really. Only pain is left chest wall over pump. Sitting up in chair. In good spirits. Family is here.  Objective: Vital signs in last 24 hours: Temp:  [97.5 F (36.4 C)-99.3 F (37.4 C)] 98.1 F (36.7 C) (11/17 0949) Pulse Rate:  [77-190] 85 (11/17 0949) Cardiac Rhythm: A-V Sequential paced (11/16 2100) Resp:  [4-28] 17 (11/17 0949) BP: (35-135)/(25-119) 91/69 (11/17 0949) SpO2:  [74 %-100 %] 93 % (11/17 0949) Weight:  [143.9 kg] 143.9 kg (11/17 0400)  Hemodynamic parameters for last 24 hours: PAP: (23-38)/(9-23) 32/18 CVP:  [9 mmHg-23 mmHg] 14 mmHg CO:  [7.6 L/min] 7.6 L/min CI:  [2.91 L/min/m2] 2.91 L/min/m2  Intake/Output from previous day: 11/16 0701 - 11/17 0700 In: 2036.1 [P.O.:360; I.V.:1261.3; Blood:315; IV Piggyback:99.9] Out: 2765 [Urine:2515; Chest Tube:250] Intake/Output this shift: Total I/O In: 69.5 [I.V.:69.5] Out: 370 [Urine:170; Chest Tube:200]  General appearance: alert and cooperative Neurologic: intact Heart: regular rate and rhythm and LVAD humm Lungs: clear to auscultation bilaterally Abdomen: soft, non-tender; bowel sounds normal Extremities: edema moderate Wound: incision ok  Lab Results: Recent Labs    05/05/23 0438 05/06/23 0414  WBC 18.5* 16.3*  HGB 8.0* 8.3*  HCT 24.9* 25.8*  PLT 132* 149*   BMET:  Recent Labs    05/05/23 0438 05/06/23 0414  NA 132* 135  K 4.2 4.3  CL 102 100  CO2 22 27  GLUCOSE 216* 198*  BUN 14 18  CREATININE 1.01 0.99  CALCIUM 8.1* 8.8*    PT/INR:  Recent Labs    05/06/23 0414  LABPROT 17.3*  INR 1.4*   ABG    Component Value Date/Time   PHART 7.381 05/04/2023 1654   HCO3 21.2 05/04/2023 1654   TCO2 22 05/04/2023 1654   ACIDBASEDEF 3.0 (H) 05/04/2023 1654   O2SAT 52.9 05/06/2023 0414   CBG (last 3)  Recent Labs     05/05/23 2345 05/06/23 0401 05/06/23 0735  GLUCAP 169* 190* 184*   CXR ok. Large gastric bubble.  Assessment/Plan: S/P Procedure(s) (LRB): INSERTION OF IMPLANTABLE LEFT VENTRICULAR ASSIST DEVICE - HEARTMATE 3 (N/A) TRANSESOPHAGEAL ECHOCARDIOGRAM (N/A)  POD 3  Hemodynamically stable on Milrinone 0.25, epi 6, vaso 0.02.  CI 2.85, CVP 14, Co-ox 53%. Co-ox is marginal but everything else looks good so not too concerned about that.  He is about 16 lbs over preop wt if accurate. Continue diuresis.  CT output low. Will remove pacing wires and then MT, pleural tubes. Keep pocket drain until tomorrow.  Start Coumadin 5 mg today.  IS, OOB, PT/OT.     LOS: 6 days    Alleen Borne 05/06/2023

## 2023-05-06 NOTE — Progress Notes (Signed)
Peripherally Inserted Central Catheter Placement  The IV Nurse has discussed with the patient and/or persons authorized to consent for the patient, the purpose of this procedure and the potential benefits and risks involved with this procedure.  The benefits include less needle sticks, lab draws from the catheter, and the patient may be discharged home with the catheter. Risks include, but not limited to, infection, bleeding, blood clot (thrombus formation), and puncture of an artery; nerve damage and irregular heartbeat and possibility to perform a PICC exchange if needed/ordered by physician.  Alternatives to this procedure were also discussed.  Bard Power PICC patient education guide, fact sheet on infection prevention and patient information card has been provided to patient /or left at bedside.    PICC Placement Documentation  PICC Double Lumen 05/06/23 Right Basilic 48 cm 0 cm (Active)  Indication for Insertion or Continuance of Line Chronic illness with exacerbations (CF, Sickle Cell, etc.) 05/06/23 1200  Exposed Catheter (cm) 0 cm 05/06/23 1200  Site Assessment Clean, Dry, Intact 05/06/23 1200  Lumen #1 Status Saline locked;Blood return noted 05/06/23 1200  Lumen #2 Status Saline locked;Blood return noted 05/06/23 1200  Dressing Type Transparent;Securing device 05/06/23 1200  Dressing Status Antimicrobial disc in place;Clean, Dry, Intact 05/06/23 1200  Line Care Connections checked and tightened 05/06/23 1200  Line Adjustment (NICU/IV Team Only) No 05/06/23 1200  Dressing Intervention New dressing 05/06/23 1200  Dressing Change Due 05/13/23 05/06/23 1200       Joe [name] 05/06/2023, 12:54 PM

## 2023-05-06 NOTE — Progress Notes (Signed)
Inpatient Rehab Admissions Coordinator Note:   Per PT patient was screened for CIR candidacy by Jerrian Mells Luvenia Starch, CCC-SLP. At this time, pt appears to be a potential candidate for CIR. I will place an order for rehab consult for full assessment, per our protocol.  Please contact me any with questions.Wolfgang Phoenix, MS, CCC-SLP Admissions Coordinator [phone] 05/06/23 4:39 PM

## 2023-05-06 NOTE — Progress Notes (Signed)
Drive line dressing change:   LVAD driveline dressing was changed using proper sterile technique. Old dressing removed. Driveline site cleaned using chloroprep x2. Silver strip applied. New gauze dressing applied using sterile technique. Driveline secured at driveline site with suture. Driveline site clean, no drainage, no redness, no swelling, no tenderness. No odor from site. Anchor intact and applied correctly.     Donnelly Angelica, RN  NEXT DRESSING CHANGE DUE 11/18/20024.

## 2023-05-06 NOTE — Progress Notes (Addendum)
Drive line dressing change:   LVAD driveline dressing was changed using proper sterile technique. Old dressing removed. Driveline site cleaned using chloroprep x2. Silver strip applied. New gauze dressing applied using sterile technique. Driveline secured at driveline site with suture. Driveline site clean, no drainage from site, no redness, no swelling, no tenderness. No odor from site. Anchor intact and applied correctly.     Donnelly Angelica, RN  NEXT DRESSING CHANGE DUE 05/05/2022.

## 2023-05-06 NOTE — Progress Notes (Signed)
PHARMACY - ANTICOAGULATION CONSULT NOTE  Pharmacy Consult for warfarin Indication: LVAD HM3 implanted 11/14  No Known Allergies  Patient Measurements: Height: 6\' 3"  (190.5 cm) Weight: (!) 143.9 kg (317 lb 3.9 oz) IBW/kg (Calculated) : 84.5   Vital Signs: Temp: 97.7 F (36.5 C) (11/17 1245) Temp Source: Core (11/17 0400) BP: 97/75 (11/17 1245) Pulse Rate: 79 (11/17 1245)  Labs: Recent Labs    05/04/23 0309 05/04/23 0426 05/04/23 1700 05/05/23 0438 05/06/23 0414  HGB 9.9*   < > 8.5* 8.0* 8.3*  HCT 28.6*   < > 25.5* 24.9* 25.8*  PLT 163  --  150 132* 149*  APTT 30  --   --  39* 38*  LABPROT 16.3*  --   --  19.4* 17.3*  INR 1.3*  --   --  1.6* 1.4*  CREATININE 1.42*  --  1.47* 1.01 0.99   < > = values in this interval not displayed.    Estimated Creatinine Clearance: 133.7 mL/min (by C-G formula based on SCr of 0.99 mg/dL).   Medical History: Past Medical History:  Diagnosis Date   Abnormal EKG    Acute systolic heart failure (HCC) 09/06/2017   Angina pectoris (HCC) 09/05/2017   Body mass index 45.0-49.9, adult (HCC)    CAD S/P percutaneous coronary angioplasty 09/07/2017   mLAD and dLAD PCI with DES 09/06/17   CHF (congestive heart failure) (HCC)    Chronic systolic (congestive) heart failure (HCC) 03/19/2019   Complication of anesthesia 1995   "had hard time waking up; breathing w/vasectomy"   Coronary artery disease    Erectile dysfunction    Essential hypertension, benign    Fatigue    GERD (gastroesophageal reflux disease)    Gout    High cholesterol    "just started tx yesterday" (09/06/2017)   History of gout    "RX prn" (09/06/2017)   Ischemic cardiomyopathy 09/07/2017   EF 25-35%   Muscular chest pain    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   Below Left Nare (MOH's)   Obesity    Obstructive sleep apnea    OSA on CPAP    Plantar fasciitis    Pre-operative clearance 02/11/2018   Presence of cardiac resynchronization therapy defibrillator  (CRT-D) 04/02/2019   [employer] Jude   Right bundle branch block    Shortness of breath 09/05/2017   Testosterone deficiency    Type 2 diabetes mellitus with complication, without long-term current use of insulin (HCC) 09/05/2017   Type II diabetes mellitus (HCC)    "started tx 08/28/2017"      Assessment: Joe [name] with heart failure s/p LVAD implant HM3 11/14.  INR 1.4, Tbili 2.2, no bleeding chest tubes removed except pocket drain, hgb stbable 8, LDH stable 200s. Begin warfarin per TCTS - rx will follow  Asa x30days post op ends 12/14  Goal of Therapy:  INR 2-2.5 Monitor platelets by anticoagulation protocol: Yes   Plan:  Warfarin 5mg  x1 today per TCTS Daily Promtime, cbc  Monitor s/s bleeding    Leota Sauers Pharm.D. CPP, BCPS Clinical Pharmacist [phone] 05/06/2023 1:13 PM

## 2023-05-06 NOTE — Progress Notes (Signed)
Patient ID: Joe [name], male   DOB: Jul 10, 1970, 52 y.o.   MRN: [id number] TCTS evening Rounds:  Hemodynamics stable on milrinone 0.25, epi 6, vaso 0.02.  Co-ox this pm unchanged at 52.4. CI 3.46.  Lasix drip started at 8. UO 150-200/hr since.  PICC line inserted today. Should be able to remove swan and sleeve tomorrow.  He feels well.

## 2023-05-07 ENCOUNTER — Encounter (HOSPITAL_COMMUNITY): Payer: Self-pay | Admitting: Cardiothoracic Surgery

## 2023-05-07 ENCOUNTER — Encounter: Payer: 59 | Admitting: Student

## 2023-05-07 ENCOUNTER — Inpatient Hospital Stay (HOSPITAL_COMMUNITY): Payer: 59

## 2023-05-07 DIAGNOSIS — Z515 Encounter for palliative care: Secondary | ICD-10-CM | POA: Diagnosis not present

## 2023-05-07 DIAGNOSIS — Z95811 Presence of heart assist device: Secondary | ICD-10-CM

## 2023-05-07 DIAGNOSIS — I509 Heart failure, unspecified: Secondary | ICD-10-CM | POA: Diagnosis not present

## 2023-05-07 DIAGNOSIS — I255 Ischemic cardiomyopathy: Secondary | ICD-10-CM | POA: Diagnosis not present

## 2023-05-07 DIAGNOSIS — I5022 Chronic systolic (congestive) heart failure: Secondary | ICD-10-CM | POA: Diagnosis not present

## 2023-05-07 LAB — BASIC METABOLIC PANEL
Anion gap: 9 (ref 5–15)
BUN: 21 mg/dL — ABNORMAL HIGH (ref 6–20)
CO2: 30 mmol/L (ref 22–32)
Calcium: 8.1 mg/dL — ABNORMAL LOW (ref 8.9–10.3)
Chloride: 98 mmol/L (ref 98–111)
Creatinine, Ser: 1.14 mg/dL (ref 0.61–1.24)
GFR, Estimated: >60 mL/min (ref >60)
Glucose, Bld: 165 mg/dL — ABNORMAL HIGH (ref 70–99)
Potassium: 3.3 mmol/L — ABNORMAL LOW (ref 3.5–5.1)
Sodium: 137 mmol/L (ref 135–145)

## 2023-05-07 LAB — MAGNESIUM: Magnesium: 1.9 mg/dL (ref 1.7–2.4)

## 2023-05-07 LAB — PROTIME-INR
INR: 1.4 — ABNORMAL HIGH (ref 0.8–1.2)
Prothrombin Time: 17.5 s — ABNORMAL HIGH (ref 11.4–15.2)

## 2023-05-07 LAB — CBC WITH DIFFERENTIAL/PLATELET
Abs Immature Granulocytes: 0.21 10*3/uL — ABNORMAL HIGH (ref 0.00–0.07)
Basophils Absolute: 0.1 10*3/uL (ref 0.0–0.1)
Basophils Relative: 0 %
Eosinophils Absolute: 0.2 10*3/uL (ref 0.0–0.5)
Eosinophils Relative: 2 %
HCT: 24.3 % — ABNORMAL LOW (ref 39.0–52.0)
Hemoglobin: 8 g/dL — ABNORMAL LOW (ref 13.0–17.0)
Immature Granulocytes: 2 %
Lymphocytes Relative: 9 %
Lymphs Abs: 1.2 10*3/uL (ref 0.7–4.0)
MCH: 32.3 pg (ref 26.0–34.0)
MCHC: 32.9 g/dL (ref 30.0–36.0)
MCV: 98 fL (ref 80.0–100.0)
Monocytes Absolute: 1.3 10*3/uL — ABNORMAL HIGH (ref 0.1–1.0)
Monocytes Relative: 10 %
Neutro Abs: 10.2 10*3/uL — ABNORMAL HIGH (ref 1.7–7.7)
Neutrophils Relative %: 77 %
Platelets: 160 10*3/uL (ref 150–400)
RBC: 2.48 MIL/uL — ABNORMAL LOW (ref 4.22–5.81)
RDW: 16.8 % — ABNORMAL HIGH (ref 11.5–15.5)
WBC: 13.2 10*3/uL — ABNORMAL HIGH (ref 4.0–10.5)
nRBC: 1.4 % — ABNORMAL HIGH (ref 0.0–0.2)

## 2023-05-07 LAB — GLUCOSE, CAPILLARY
Glucose-Capillary: 151 mg/dL — ABNORMAL HIGH (ref 70–99)
Glucose-Capillary: 201 mg/dL — ABNORMAL HIGH (ref 70–99)
Glucose-Capillary: 180 mg/dL — ABNORMAL HIGH (ref 70–99)
Glucose-Capillary: 175 mg/dL — ABNORMAL HIGH (ref 70–99)
Glucose-Capillary: 221 mg/dL — ABNORMAL HIGH (ref 70–99)
Glucose-Capillary: 147 mg/dL — ABNORMAL HIGH (ref 70–99)

## 2023-05-07 LAB — LACTATE DEHYDROGENASE: LDH: 251 U/L — ABNORMAL HIGH (ref 98–192)

## 2023-05-07 LAB — COOXEMETRY PANEL
Carboxyhemoglobin: 1.9 % — ABNORMAL HIGH (ref 0.5–1.5)
Carboxyhemoglobin: 1.7 % — ABNORMAL HIGH (ref 0.5–1.5)
Methemoglobin: <0.7 % (ref 0.0–1.5)
Methemoglobin: 1.7 % — ABNORMAL HIGH (ref 0.0–1.5)
O2 Saturation: 53.4 %
O2 Saturation: 64.2 %
Total hemoglobin: 8.2 g/dL — ABNORMAL LOW (ref 12.0–16.0)
Total hemoglobin: 8.3 g/dL — ABNORMAL LOW (ref 12.0–16.0)

## 2023-05-07 LAB — ECHOCARDIOGRAM LIMITED
Height: 75 in
S' Lateral: 4.5 cm
Weight: 4888.92 [oz_av]

## 2023-05-07 LAB — SURGICAL PATHOLOGY

## 2023-05-07 LAB — PHOSPHORUS: Phosphorus: 3.2 mg/dL (ref 2.5–4.6)

## 2023-05-07 LAB — APTT: aPTT: 32 s (ref 24–36)

## 2023-05-07 MED ORDER — INSULIN DETEMIR 100 UNIT/ML ~~LOC~~ SOLN
38.0000 [IU] | Freq: Two times a day (BID) | SUBCUTANEOUS | Status: DC
  Administered 2023-05-07 – 2023-05-09 (×4): 38 [IU] via SUBCUTANEOUS
  Filled 2023-05-07 (×6): qty 0.38

## 2023-05-07 MED ORDER — INSULIN ASPART 100 UNIT/ML IJ SOLN
0.0000 [IU] | Freq: Three times a day (TID) | INTRAMUSCULAR | Status: DC
Start: 2023-05-07 — End: 2023-05-21
  Administered 2023-05-07: 4 [IU] via SUBCUTANEOUS
  Administered 2023-05-08: 2 [IU] via SUBCUTANEOUS
  Administered 2023-05-08 (×2): 4 [IU] via SUBCUTANEOUS
  Administered 2023-05-10: 2 [IU] via SUBCUTANEOUS
  Administered 2023-05-10: 4 [IU] via SUBCUTANEOUS
  Administered 2023-05-11: 2 [IU] via SUBCUTANEOUS
  Administered 2023-05-11 – 2023-05-14 (×5): 4 [IU] via SUBCUTANEOUS
  Administered 2023-05-14 (×2): 2 [IU] via SUBCUTANEOUS
  Administered 2023-05-15: 4 [IU] via SUBCUTANEOUS
  Administered 2023-05-15: 8 [IU] via SUBCUTANEOUS
  Administered 2023-05-15: 2 [IU] via SUBCUTANEOUS
  Administered 2023-05-16 (×2): 4 [IU] via SUBCUTANEOUS
  Administered 2023-05-16 – 2023-05-17 (×4): 2 [IU] via SUBCUTANEOUS
  Administered 2023-05-17: 4 [IU] via SUBCUTANEOUS
  Administered 2023-05-18 (×4): 2 [IU] via SUBCUTANEOUS
  Administered 2023-05-19: 4 [IU] via SUBCUTANEOUS
  Administered 2023-05-19 (×3): 2 [IU] via SUBCUTANEOUS
  Administered 2023-05-20: 4 [IU] via SUBCUTANEOUS
  Administered 2023-05-20 – 2023-05-21 (×4): 2 [IU] via SUBCUTANEOUS

## 2023-05-07 MED ORDER — MAGNESIUM SULFATE 2 GM/50ML IV SOLN
2.0000 g | Freq: Once | INTRAVENOUS | Status: AC
  Administered 2023-05-07: 2 g via INTRAVENOUS
  Filled 2023-05-07: qty 50

## 2023-05-07 MED ORDER — POTASSIUM CHLORIDE CRYS ER 20 MEQ PO TBCR
20.0000 meq | EXTENDED_RELEASE_TABLET | ORAL | Status: AC
  Administered 2023-05-07 (×3): 20 meq via ORAL
  Filled 2023-05-07 (×3): qty 1

## 2023-05-07 MED ORDER — WARFARIN SODIUM 5 MG PO TABS
5.0000 mg | ORAL_TABLET | Freq: Once | ORAL | Status: AC
  Administered 2023-05-07: 5 mg via ORAL
  Filled 2023-05-07: qty 1

## 2023-05-07 MED ORDER — FUROSEMIDE 10 MG/ML IJ SOLN
40.0000 mg | Freq: Two times a day (BID) | INTRAMUSCULAR | Status: DC
  Administered 2023-05-08 – 2023-05-10 (×6): 40 mg via INTRAVENOUS
  Filled 2023-05-07 (×6): qty 4

## 2023-05-07 MED ORDER — INSULIN ASPART 100 UNIT/ML IJ SOLN
0.0000 [IU] | INTRAMUSCULAR | Status: DC
  Administered 2023-05-07: 4 [IU] via SUBCUTANEOUS

## 2023-05-07 MED ORDER — WARFARIN - PHARMACIST DOSING INPATIENT: Freq: Every day | Status: DC

## 2023-05-07 MED FILL — Potassium Chloride Inj 2 mEq/ML: INTRAVENOUS | Qty: 80 | Status: AC

## 2023-05-07 MED FILL — Magnesium Sulfate Inj 50%: INTRAMUSCULAR | Qty: 10 | Status: AC

## 2023-05-07 MED FILL — Heparin Sodium (Porcine) Inj 1000 Unit/ML: Qty: 1000 | Status: AC

## 2023-05-07 NOTE — Progress Notes (Signed)
   Palliative Medicine Inpatient Follow Up Note HPI: Joe [name] is a 52 y.o. male with medical history significant for chronic systolic heart failure in setting of ischemic cardiomyopathy, V-fib arrest, hyperlipidemia, obstructive sleep apnea on home nocturnal CPAP, type 2 diabetes mellitus. The Palliative medicine team has been asked to assist in VAD evaluation.   Today's Discussion 05/07/2023  *Please note that this is a verbal dictation therefore any spelling or grammatical errors are due to the "Dragon Medical One" system interpretation.  Chart reviewed inclusive of vital signs, progress notes, laboratory results, and diagnostic images.   I met with Khiyan this morning. He was awake and alert. He shares that three chest tubes were removed yesterday though one will remain in place for short term. He got an echocardiogram this morning.   Fermon denies pain in his right chest today. He denies nausea or shortness of breath.  Plan for CIR once medically optimized.  Palliative Support Provided.   Objective Assessment: Vital Signs Vitals:   05/07/23 0930 05/07/23 0945  BP: (!) 78/63 (!) 95/58  Pulse: (!) 144 (!) 135  Resp: 11 12  Temp: (!) 97.5 F (36.4 C) (!) 97.5 F (36.4 C)  SpO2: 96% 97%    Intake/Output Summary (Last 24 hours) at 05/07/2023 1044 Last data filed at 05/07/2023 0700 Gross per 24 hour  Intake 848.57 ml  Output 2950 ml  Net -2101.43 ml   Last Weight  Most recent update: 05/07/2023  6:47 AM    Weight  138.6 kg (305 lb 8.9 oz)                SUMMARY OF RECOMMENDATIONS   Full Code / Full Scope of Care   S/P LVAD POD #4   Appreciate Chaplain support  CIR admissions coordinator involved for ongoing assessment to identify if patient is a good candidate   PMT will continue to follow along incrementally  Time Spent: 25 ______________________________________________________________________________________ Lamarr Lulas [place]  Palliative Medicine Team Team Cell Phone: [phone] Please utilize secure chat with additional questions, if there is no response within 30 minutes please call the above phone number  Palliative Medicine Team providers are available by phone from 7am to 7pm daily and can be reached through the team cell phone.  Should this patient require assistance outside of these hours, please call the patient's attending physician.

## 2023-05-07 NOTE — Progress Notes (Signed)
Inpatient Rehab Admissions Coordinator:   Consult received and chart reviewed.  OT eval pending.  Theone Murdoch to be pulled today.  On lasix gtt.  I will f/u with pt/family at bedside tomorrow.    Estill Dooms, PT, DPT Admissions Coordinator [phone] 05/07/23  3:12 PM

## 2023-05-07 NOTE — Progress Notes (Signed)
OT Cancellation Note  Patient Details Name: Joe [name] MRN: [id number] DOB: [birth year]   Cancelled Treatment:    Reason Eval/Treat Not Completed: Other (comment). RN reports MD has asked to wait for OOB until Theone Murdoch is pulled and to check back this afternoon.   Tyler Deis, OTR/L [facility] Acute Rehabilitation Office: [phone]   Myrla Halsted 05/07/2023, 11:22 AM

## 2023-05-07 NOTE — Progress Notes (Signed)
Remote ICD transmission.   

## 2023-05-07 NOTE — Progress Notes (Signed)
  Echocardiogram 2D Echocardiogram has been performed.  Milda Smart 05/07/2023, 10:03 AM

## 2023-05-07 NOTE — Progress Notes (Signed)
H&V Care Navigation CSW Progress Note  Outpatient Heart Failure CSW continuing to follow patient in inpatient setting post LVAD implant.  Patient asleep in room and no family at bedside at time of visit.  No concerns reported from LVAD coordinators at this time.   SDOH Screenings   Food Insecurity: No Food Insecurity (04/30/2023)  Housing: Low Risk  (04/30/2023)  Transportation Needs: No Transportation Needs (04/30/2023)  Utilities: Not At Risk (04/30/2023)  Financial Resource Strain: Low Risk  (04/20/2023)  Social Connections: Unknown (10/28/2021)   Received from [facility]  Tobacco Use: Low Risk  (05/03/2023)     Burna Sis, LCSW Clinical Social Worker [facility] Desk#: [phone] Cell#: [phone]

## 2023-05-07 NOTE — Progress Notes (Signed)
PHARMACY - ANTICOAGULATION CONSULT NOTE  Pharmacy Consult for warfarin Indication: LVAD HM3 implanted 11/14  No Known Allergies  Patient Measurements: Height: 6\' 3"  (190.5 cm) Weight: (!) 138.6 kg (305 lb 8.9 oz) IBW/kg (Calculated) : 84.5   Vital Signs: Temp: 97.3 F (36.3 C) (11/18 1100) Temp Source: Core (11/18 0400) BP: 83/63 (11/18 1100) Pulse Rate: 113 (11/18 1100)  Labs: Recent Labs    05/05/23 0438 05/06/23 0414 05/07/23 0400  HGB 8.0* 8.3* 8.0*  HCT 24.9* 25.8* 24.3*  PLT 132* 149* 160  APTT 39* 38* 32  LABPROT 19.4* 17.3* 17.5*  INR 1.6* 1.4* 1.4*  CREATININE 1.01 0.99 1.14    Estimated Creatinine Clearance: 113.8 mL/min (by C-G formula based on SCr of 1.14 mg/dL).   Medical History: Past Medical History:  Diagnosis Date   Abnormal EKG    Acute systolic heart failure (HCC) 09/06/2017   Angina pectoris (HCC) 09/05/2017   Body mass index 45.0-49.9, adult (HCC)    CAD S/P percutaneous coronary angioplasty 09/07/2017   mLAD and dLAD PCI with DES 09/06/17   CHF (congestive heart failure) (HCC)    Chronic systolic (congestive) heart failure (HCC) 03/19/2019   Complication of anesthesia 1995   "had hard time waking up; breathing w/vasectomy"   Coronary artery disease    Erectile dysfunction    Essential hypertension, benign    Fatigue    GERD (gastroesophageal reflux disease)    Gout    High cholesterol    "just started tx yesterday" (09/06/2017)   History of gout    "RX prn" (09/06/2017)   Ischemic cardiomyopathy 09/07/2017   EF 25-35%   Muscular chest pain    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   Below Left Nare (MOH's)   Obesity    Obstructive sleep apnea    OSA on CPAP    Plantar fasciitis    Pre-operative clearance 02/11/2018   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/02/2019   [employer] Jude   Right bundle branch block    Shortness of breath 09/05/2017   Testosterone deficiency    Type 2 diabetes mellitus  with complication, without long-term current use of insulin (HCC) 09/05/2017   Type II diabetes mellitus (HCC)    "started tx 08/28/2017"      Assessment: 52yom with heart failure s/p LVAD implant HM3 11/14.  INR 1.4, Tbili 2.2, no bleeding chest tubes removed except pocket drain.  Per TCTS okay to start Coumadin 11/17.  INR slightly elevated at baseline (1.6).  INR 1.4 today.  Goal of Therapy:  INR 2-2.5 Monitor platelets by anticoagulation protocol: Yes   Plan:  Warfarin 5mg  x1 again today. Daily PT/INR. Monitor s/s bleeding    Reece Leader, Colon Flattery, [facility] Clinical Pharmacist  05/07/2023 11:49 AM   [facility] pharmacy phone numbers are listed on [email or web address]

## 2023-05-07 NOTE — Progress Notes (Signed)
LVAD Coordinator Rounding Note:  Admitted 04/30/23 due to for optimization prior to VAD.   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  Pt sitting up in the bed. Wife at bedside. Pocket drain remains. Wires and CTs removed yesterday.   Dr. Gala Romney increased VAD speed to 6000 over the weekend. Dr Maren Beach reviewed echo this morning and increased speed to 6100.  Vital signs: Temp: 97.5 HR: 80 Doppler Pressure:72 Automatic BP: 97/74 (83) O2 Sat: 99% 3L [place] Wt:316.4>305.5lbs    LVAD interrogation reveals:  Speed: 6100 Flow: 5.5 Power:  4.9w PI: 3.9 Alarms: none Events:  3 PI events; 10 yesterday Hematocrit: 24 Fixed speed: 6100 Low speed limit: 5800  Drive Line: Existing VAD dressing removed and site care performed using sterile technique. Drive line exit site cleaned with Chlora prep applicators x 2, rinsed with sterile saline and allowed to dry. Silver strip applied flush with exit site. Exit site is unincorporated, the velour is fully implanted at exit site with 1 suture in place. Scant bloody drainage. No redness, tenderness, foul odor or rash noted. Drive line anchor re-applied. Continue daily dressing changes by nurse champion or VAD Coordinator. Next dressing change 05/08/23.      Labs:  LDH trend: 275>251  Hgb trend: 9.9>8  INR trend: 1.3>1.4  Anticoagulation Plan: -INR Goal: 2.0-2.5 -ASA Dose: 325mg   Blood Products:  Intra-op- 6 FFP 1 PLT 2 CYRO  05/05/23>>1 u/PRBC  Device: -Medtronic BiV -Therapies: OFF  Arrythmias:   Respiratory: Extubated 05/04/23  Infection:   Renal:  -BUN/CRT: 16/1.42>21/1.14  VAD Education: Wife observed dressing change at bedside today and given instructions and sterile gloves to take home to review.  Adverse Events on VAD:  Plan/Recommendations:  1. Page VAD coordinators for any equipment or drive line issues 2. Daily drive line dressing changes per nurse  champion or VAD Coordinator  Carlton Adam RN, BSN VAD Coordinator 24/7 Pager [phone]

## 2023-05-07 NOTE — Plan of Care (Signed)
  Problem: Cardiovascular: Goal: Ability to achieve and maintain adequate cardiovascular perfusion will improve Outcome: Progressing Goal: Vascular access site(s) Level 0-1 will be maintained Outcome: Progressing   Problem: Clinical Measurements: Goal: Ability to maintain clinical measurements within normal limits will improve Outcome: Progressing Goal: Will remain free from infection Outcome: Progressing Goal: Diagnostic test results will improve Outcome: Progressing Goal: Respiratory complications will improve Outcome: Progressing Goal: Cardiovascular complication will be avoided Outcome: Progressing   Problem: Activity: Goal: Risk for activity intolerance will decrease Outcome: Progressing   Problem: Coping: Goal: Level of anxiety will decrease Outcome: Progressing   Problem: Elimination: Goal: Will not experience complications related to bowel motility Outcome: Not Progressing Goal: Will not experience complications related to urinary retention Outcome: Progressing   Problem: Pain Management: Goal: General experience of comfort will improve Outcome: Progressing   Problem: Skin Integrity: Goal: Risk for impaired skin integrity will decrease Outcome: Progressing

## 2023-05-07 NOTE — Progress Notes (Signed)
Advanced Heart Failure VAD Team Note  PCP-Cardiologist: [name], MD   Subjective:    11/14: s/p HM3 LVAD implantation Post op day #4  VP 0.02. Epi 6. Milrinone 0.25  Lasix gtt at 8. 3L UOP. Back to preop weight. Co-ox marginal 53. CI 2.6. CVP 12 on exam  Wires and CTs removed yesterday. Pocket drain remains. Hgb stable 8.0.  Swan #s: PAP: (22-59)/(10-28) 29/20 CVP:  [9 mmHg-33 mmHg] 17 mmHg CO:  [6.8 L/min-9.1 L/min] 6.8 L/min CI:  [2.6 L/min/m2-3.46 L/min/m2] 2.6 L/min/m2 PAPi 1.1  LVAD INTERROGATION:  HeartMate HM3 LVAD:   Flow 5.6 liters/min, speed 6000, Power 4.9, PI 3.8. 3 PI, no speed drops   Objective:    Vital Signs:   Temp:  [97.5 F (36.4 C)-99.9 F (37.7 C)] 97.7 F (36.5 C) (11/18 0730) Pulse Rate:  [75-187] 78 (11/18 0730) Resp:  [5-26] 20 (11/18 0730) BP: (75-135)/(52-119) 99/78 (11/18 0730) SpO2:  [74 %-100 %] 94 % (11/18 0730) Weight:  [138.6 kg] 138.6 kg (11/18 0500) Last BM Date : 05/06/23  Mean arterial Pressure 80-90s  Intake/Output:   Intake/Output Summary (Last 24 hours) at 05/07/2023 0745 Last data filed at 05/07/2023 0700 Gross per 24 hour  Intake 977.83 ml  Output 3400 ml  Net -2422.17 ml   Physical Exam    CVP 12 General: Well appearing. No distress on RA HEENT: neck supple.  RIJ PAC Cardiac: JVP 13cm. Mechanical heart sounds with LVAD hum present.  Resp: Lung sounds clear, diminished and equal B/L Abdomen: Soft, non-tender, non-distended. + BS. + BM Driveline: Dressing C/D/I. No drainage or redness. Anchor in place. MSI OTA Extremities: Warm and dry. No rash, cyanosis, or edema. RUE PICC Neuro: Alert and oriented x3. Affect pleasant. Moves all extremities without difficulty.  Telemetry   AV paced 80 (personally reviewed)  Labs   Basic Metabolic Panel: Recent Labs  Lab 05/03/23 2057 05/03/23 2103 05/04/23 0309 05/04/23 0426 05/04/23 1654 05/04/23 1700 05/05/23 0438 05/06/23 0414 05/07/23 0400  NA  137   < > 136   < > 138 137 132* 135 137  K 3.7   < > 4.1   < > 3.9 3.9 4.2 4.3 3.3*  CL 105  --  105  --   --  105 102 100 98  CO2 24  --  20*  --   --  22 22 27 30   GLUCOSE 164*  --  153*  --   --  166* 216* 198* 165*  BUN 18  --  16  --   --  13 14 18  21*  CREATININE 1.48*  --  1.42*  --   --  1.47* 1.01 0.99 1.14  CALCIUM 8.2*  --  8.2*  --   --  8.4* 8.1* 8.8* 8.1*  MG 2.6*  --   --   --   --  2.3 2.3 2.2 1.9  PHOS  --   --  3.7  --   --   --  2.7 2.5 3.2   < > = values in this interval not displayed.    Liver Function Tests: Recent Labs  Lab 05/04/23 0309 05/05/23 0438 05/06/23 0414  AST 63* 43* 34  ALT 17 16 17   ALKPHOS 45 45 53  BILITOT 2.1* 2.4* 2.2*  PROT 5.8* 5.7* 5.9*  ALBUMIN 3.4* 3.1* 3.0*   No results for input(s): "LIPASE", "AMYLASE" in the last 168 hours. No results for input(s): "AMMONIA" in the last 168 hours.  CBC: Recent Labs  Lab 05/04/23 0309 05/04/23 0426 05/04/23 1654 05/04/23 1700 05/05/23 0438 05/06/23 0414 05/07/23 0400  WBC 16.0*  --   --  17.4* 18.5* 16.3* 13.2*  NEUTROABS 13.1*  --   --   --  15.1* 13.6* 10.2*  HGB 9.9*   < > 8.5* 8.5* 8.0* 8.3* 8.0*  HCT 28.6*   < > 25.0* 25.5* 24.9* 25.8* 24.3*  MCV 94.1  --   --  94.8 99.2 97.0 98.0  PLT 163  --   --  150 132* 149* 160   < > = values in this interval not displayed.    INR: Recent Labs  Lab 05/03/23 1505 05/04/23 0309 05/05/23 0438 05/06/23 0414 05/07/23 0400  INR 1.4* 1.3* 1.6* 1.4* 1.4*    Other results:   Imaging   DG CHEST PORT 1 VIEW  Result Date: 05/06/2023 CLINICAL DATA:  PICC placement EXAM: PORTABLE CHEST 1 VIEW COMPARISON:  05/06/2023, 5:41 a.m. FINDINGS: Enlarged cardiac silhouette. Left base consolidation and effusion. No pneumothorax. Swan-Ganz catheter tip overlies main pulmonary artery. Right-sided PICC tip is in the neck. Left-sided pacer. Left ventricular assist device. IMPRESSION: Left base consolidation and effusion. Right-sided PICC tip extends  into the neck and off the x-ray. Electronically Signed   By: Layla Maw M.D.   On: 05/06/2023 13:25   DG CHEST PORT 1 VIEW  Result Date: 05/06/2023 CLINICAL DATA:  Line placement. EXAM: PORTABLE CHEST 1 VIEW COMPARISON:  12:50 p.m., 05/06/2023. FINDINGS: Enlarged cardiac silhouette. Left base consolidation and effusion. Median sternotomy wires. Right-sided Swan-Ganz catheter tip overlies right pulmonary artery. Right-sided PICC tip mid SVC. Left ventricular cyst device. Left-sided pacer. IMPRESSION: Left base consolidation and effusion. Right-sided PICC tip has been repositioned to the mid SVC. Electronically Signed   By: Layla Maw M.D.   On: 05/06/2023 13:23   [place] EKG SITE RITE  Result Date: 05/06/2023 If Site Rite image not attached, placement could not be confirmed due to current cardiac rhythm.  DG Chest Port 1 View  Result Date: 05/06/2023 CLINICAL DATA:  Left ventricular assist device present. EXAM: PORTABLE CHEST 1 VIEW COMPARISON:  05/05/2023 FINDINGS: Right jugular central line with a pulmonary artery catheter. Pulmonary artery catheter extends into the right pulmonary artery. Again noted is a left chest ICD. Left ventricular assist device is present. Again noted is elevation of the left hemidiaphragm and probable atelectasis at the left lung base. Again noted is a right chest tube. There may be an additional chest tube on left side but difficult to evaluate due to overlying structures. Negative for a pneumothorax. IMPRESSION: 1. No acute findings. Lung volumes are similar with probable atelectasis at the left lung base. 2. Support apparatuses as described.  Negative for pneumothorax. Electronically Signed   By: Richarda Overlie M.D.   On: 05/06/2023 07:52    Medications:    Scheduled Medications:  acetaminophen  1,000 mg Oral Q6H   Or   acetaminophen (TYLENOL) oral liquid 160 mg/5 mL  1,000 mg Per Tube Q6H   allopurinol  100 mg Oral Daily   amiodarone  200 mg Oral BID    aspirin EC  325 mg Oral Daily   Or   aspirin  324 mg Per Tube Daily   Or   aspirin  300 mg Rectal Daily   bisacodyl  10 mg Oral Daily   Or   bisacodyl  10 mg Rectal Daily   Chlorhexidine Gluconate Cloth  6 each Topical Daily  escitalopram  10 mg Oral QHS   ezetimibe  10 mg Oral Daily   feeding supplement  237 mL Oral BID BM   gabapentin  400 mg Oral TID   insulin aspart  0-24 Units Subcutaneous Q4H   insulin aspart  6 Units Subcutaneous TID WC   insulin detemir  35 Units Subcutaneous BID   levothyroxine  25 mcg Oral Daily   metoCLOPramide (REGLAN) injection  10 mg Intravenous Q6H   mexiletine  250 mg Oral Q12H   potassium chloride  20 mEq Oral Q4H   rosuvastatin  40 mg Oral Daily   sodium chloride flush  10-40 mL Intracatheter Q12H   sodium chloride flush  3 mL Intravenous Q12H   sodium chloride flush  3 mL Intravenous Q12H   sodium chloride flush  3 mL Intravenous Q12H   sodium chloride flush  3 mL Intravenous Q8H   Warfarin - Physician Dosing Inpatient   Does not apply q1600    Infusions:  epinephrine 6 mcg/min (05/07/23 0700)   furosemide (LASIX) 200 mg in dextrose 5 % 100 mL (2 mg/mL) infusion 8 mg/hr (05/07/23 0700)   milrinone 0.25 mcg/kg/min (05/07/23 0700)   vasopressin 0.02 Units/min (05/07/23 0700)    PRN Medications: acetaminophen, dextrose, morphine injection, ondansetron (ZOFRAN) IV, mouth rinse, oxyCODONE, polyvinyl alcohol, sodium chloride flush, sodium chloride flush, sodium chloride flush, traMADol  Patient Profile    Joe [name] is a 52 y.o. male with h/o morbid obesity, DM2, HTN, HL, OSA, CAD and systolic HF. Now s/p HM3 LVAD Insertion 11/14 by Dt. Vantrigt.  Assessment/Plan:    1.  Chronic Systolic HF s/p HM3 Insertion 62/13 - due to iCM - Echo (8/24): EF 20-25%, RV ok. - Echo 05/01/23: EF 20%, RV moderately down (improved) - R/LHC (9/24): stable 3v CAD, elevated biventricular filling pressures with low PAPi; CI 2.2 - RHC (11/24): RA 9, PA  mean 28, PCWP 19, Fick CI 1.9, TD CI 2.2, PVR 1.6, PAPi 2.4 - S/p HM III LVAD 05/03/23 by Dr. Maren Beach - Received FFP x6, Cryo x2, PLT x1, and DDAVP in OR - Intra-op TEE - EF 20-25%, RV moderately reduced, IV septum with mild rightward bowing, AV opens intermittently - On VP 0.02, Epi 6,  milrinone 025.  - Co-ox 53% CVP 12 PAPi 1.1 - VAD interrogated personally. Parameters stable. Speeds increased to 6000 11/16 - Continue diuresis. - Continue inotrope/pressor support. Keep MAP 70-90    2. H/o VT Arrest  Hx PVCs - Followed by Dr. Elberta Fortis - Admitted 10/10-10/13/24 w/ VF treated w/ ICD shock>>loaded w/ IV amio  - Re-admitted 04/08/23 w/ VT arrest, treated w/ appropriate ICD shock x 2 + brief bystander CPR  - in setting of underling CM w/ EF < 35% + intermittent hypokalemia  - Continue  Amio 200 mg bid and mexiletine 250 mg bid   3. CAD with chronic stable angina - s/p multiple stents to LAD and LCX - Cath 9/22 stable CAD - LHC (9/24) with stable 3v CAD - Eventually restart Crestor 40mg  daily - Continue ASA. - No s/s angina  4. AKI  - SCr 1.5 (baseline 1.1) - Keep off SGLT2i  - Avoid hypotension - Post-op sCr 1.38>1.01>1.14 - Follow BMET   5. DM2 - A1c 7.2 (1/24) - SSI per CCM   6. OSA - Not using CPAP, says machine was recalled a while ago. Needs new machine. - Followed outpatient   7. Obesity - Body mass index is 37.62 kg/m.  Length  of Stay: 7  [place] Shamell Suarez, NP 05/07/2023, 7:45 AM  VAD Team --- VAD ISSUES ONLY--- Pager [phone] (7am - 7am)  Advanced Heart Failure Team  Pager [phone] (M-F; 7a - 5p)  Please contact [facility] for night-coverage after hours (5p -7a ) and weekends on [email or web address]

## 2023-05-07 NOTE — Progress Notes (Signed)
HeartMate 3 Rounding Note Status post implantation of HeartMate 3 May 03, 2023  Subjective:   Postop day 4 HeartMate 3 implant Patient had a stable weekend with good hemodynamics on low-dose inotropic support and permanent AV pacing from pacemaker generator.  He has been mobilized out of bed, urine output has been satisfactory and he is approaching his preoperative weight. His cardiac index is 2.5-2.8 but his Co-ox remains at 53%. An echocardiogram was performed today demonstrating good RV function, good position of the inflow cannula and no evidence of pericardial effusion.  LV diameter is 5 cm, down from 7 cm preoperatively. VAD speed was increased to 6100 RPM and PA catheter removed.  Coumadin has been started with his first dose of 5 mg taken yesterday.  Temporary pacing wire is out and only the pump pocket drain remains.  Exit site of the driveline is clean and dry. LVAD INTERROGATION:  HeartMate II LVAD:  Flow 5.6 liters/min, speed 6100, power 3.2, PI 4.1.  Controller intact.   Objective:    Vital Signs:   Temp:  [97 F (36.1 C)-99.9 F (37.7 C)] 97 F (36.1 C) (11/18 1457) Pulse Rate:  [71-187] 85 (11/18 1411) Resp:  [10-23] 19 (11/18 1411) BP: (75-134)/(52-112) 86/57 (11/18 1411) SpO2:  [83 %-100 %] 92 % (11/18 1411) Weight:  [138.6 kg] 138.6 kg (11/18 0500) Last BM Date : 05/07/23 Mean arterial Pressure 80-85  Intake/Output:   Intake/Output Summary (Last 24 hours) at 05/07/2023 1510 Last data filed at 05/07/2023 1300 Gross per 24 hour  Intake 899.44 ml  Output 2645 ml  Net -1745.56 ml     Physical Exam: General:  Well appearing. No resp difficulty HEENT: normal Neck: supple. JVP . Carotids no bruits. No lymphadenopathy or thryomegaly appreciated. Cor: Mechanical heart sounds with LVAD hum present. Lungs: clear Abdomen: soft, nontender, nondistended. No hepatosplenomegaly. No bruits or masses. Good bowel sounds. Extremities: no cyanosis, clubbing, rash,  edema Neuro: alert & orientedx3, cranial nerves grossly intact. moves all 4 extremities w/o difficulty. Affect pleasant  Telemetry: A-V sequentially paced at 80 bpm  Labs: Basic Metabolic Panel: Recent Labs  Lab 05/03/23 2057 05/03/23 2103 05/04/23 0309 05/04/23 0426 05/04/23 1654 05/04/23 1700 05/05/23 0438 05/06/23 0414 05/07/23 0400  NA 137   < > 136   < > 138 137 132* 135 137  K 3.7   < > 4.1   < > 3.9 3.9 4.2 4.3 3.3*  CL 105  --  105  --   --  105 102 100 98  CO2 24  --  20*  --   --  22 22 27 30   GLUCOSE 164*  --  153*  --   --  166* 216* 198* 165*  BUN 18  --  16  --   --  13 14 18  21*  CREATININE 1.48*  --  1.42*  --   --  1.47* 1.01 0.99 1.14  CALCIUM 8.2*  --  8.2*  --   --  8.4* 8.1* 8.8* 8.1*  MG 2.6*  --   --   --   --  2.3 2.3 2.2 1.9  PHOS  --   --  3.7  --   --   --  2.7 2.5 3.2   < > = values in this interval not displayed.    Liver Function Tests: Recent Labs  Lab 05/04/23 0309 05/05/23 0438 05/06/23 0414  AST 63* 43* 34  ALT 17 16 17   ALKPHOS 45 45  53  BILITOT 2.1* 2.4* 2.2*  PROT 5.8* 5.7* 5.9*  ALBUMIN 3.4* 3.1* 3.0*   No results for input(s): "LIPASE", "AMYLASE" in the last 168 hours. No results for input(s): "AMMONIA" in the last 168 hours.  CBC: Recent Labs  Lab 05/04/23 0309 05/04/23 0426 05/04/23 1654 05/04/23 1700 05/05/23 0438 05/06/23 0414 05/07/23 0400  WBC 16.0*  --   --  17.4* 18.5* 16.3* 13.2*  NEUTROABS 13.1*  --   --   --  15.1* 13.6* 10.2*  HGB 9.9*   < > 8.5* 8.5* 8.0* 8.3* 8.0*  HCT 28.6*   < > 25.0* 25.5* 24.9* 25.8* 24.3*  MCV 94.1  --   --  94.8 99.2 97.0 98.0  PLT 163  --   --  150 132* 149* 160   < > = values in this interval not displayed.    INR: Recent Labs  Lab 05/03/23 1505 05/04/23 0309 05/05/23 0438 05/06/23 0414 05/07/23 0400  INR 1.4* 1.3* 1.6* 1.4* 1.4*    Other results: EKG:   Imaging: DG CHEST PORT 1 VIEW  Result Date: 05/06/2023 CLINICAL DATA:  PICC placement EXAM: PORTABLE  CHEST 1 VIEW COMPARISON:  05/06/2023, 5:41 a.m. FINDINGS: Enlarged cardiac silhouette. Left base consolidation and effusion. No pneumothorax. Swan-Ganz catheter tip overlies main pulmonary artery. Right-sided PICC tip is in the neck. Left-sided pacer. Left ventricular assist device. IMPRESSION: Left base consolidation and effusion. Right-sided PICC tip extends into the neck and off the x-ray. Electronically Signed   By: Layla Maw M.D.   On: 05/06/2023 13:25   DG CHEST PORT 1 VIEW  Result Date: 05/06/2023 CLINICAL DATA:  Line placement. EXAM: PORTABLE CHEST 1 VIEW COMPARISON:  12:50 p.m., 05/06/2023. FINDINGS: Enlarged cardiac silhouette. Left base consolidation and effusion. Median sternotomy wires. Right-sided Swan-Ganz catheter tip overlies right pulmonary artery. Right-sided PICC tip mid SVC. Left ventricular cyst device. Left-sided pacer. IMPRESSION: Left base consolidation and effusion. Right-sided PICC tip has been repositioned to the mid SVC. Electronically Signed   By: Layla Maw M.D.   On: 05/06/2023 13:23   [place] EKG SITE RITE  Result Date: 05/06/2023 If Site Rite image not attached, placement could not be confirmed due to current cardiac rhythm.  DG Chest Port 1 View  Result Date: 05/06/2023 CLINICAL DATA:  Left ventricular assist device present. EXAM: PORTABLE CHEST 1 VIEW COMPARISON:  05/05/2023 FINDINGS: Right jugular central line with a pulmonary artery catheter. Pulmonary artery catheter extends into the right pulmonary artery. Again noted is a left chest ICD. Left ventricular assist device is present. Again noted is elevation of the left hemidiaphragm and probable atelectasis at the left lung base. Again noted is a right chest tube. There may be an additional chest tube on left side but difficult to evaluate due to overlying structures. Negative for a pneumothorax. IMPRESSION: 1. No acute findings. Lung volumes are similar with probable atelectasis at the left lung base. 2.  Support apparatuses as described.  Negative for pneumothorax. Electronically Signed   By: Richarda Overlie M.D.   On: 05/06/2023 07:52     Medications:     Scheduled Medications:  acetaminophen  1,000 mg Oral Q6H   Or   acetaminophen (TYLENOL) oral liquid 160 mg/5 mL  1,000 mg Per Tube Q6H   allopurinol  100 mg Oral Daily   amiodarone  200 mg Oral BID   aspirin EC  325 mg Oral Daily   Or   aspirin  324 mg Per Tube Daily  Or   aspirin  300 mg Rectal Daily   bisacodyl  10 mg Oral Daily   Or   bisacodyl  10 mg Rectal Daily   Chlorhexidine Gluconate Cloth  6 each Topical Daily   escitalopram  10 mg Oral QHS   ezetimibe  10 mg Oral Daily   feeding supplement  237 mL Oral BID BM   gabapentin  400 mg Oral TID   insulin aspart  0-24 Units Subcutaneous Q4H   insulin aspart  6 Units Subcutaneous TID WC   insulin detemir  35 Units Subcutaneous BID   levothyroxine  25 mcg Oral Daily   metoCLOPramide (REGLAN) injection  10 mg Intravenous Q6H   mexiletine  250 mg Oral Q12H   rosuvastatin  40 mg Oral Daily   sodium chloride flush  10-40 mL Intracatheter Q12H   sodium chloride flush  3 mL Intravenous Q12H   warfarin  5 mg Oral ONCE-1600   Warfarin - Pharmacist Dosing Inpatient   Does not apply q1600    Infusions:  epinephrine 8 mcg/min (05/07/23 1414)   furosemide (LASIX) 200 mg in dextrose 5 % 100 mL (2 mg/mL) infusion 8 mg/hr (05/07/23 1300)   milrinone 0.25 mcg/kg/min (05/07/23 1300)   vasopressin 0.02 Units/min (05/07/23 1300)    PRN Medications: acetaminophen, dextrose, ondansetron (ZOFRAN) IV, mouth rinse, oxyCODONE, polyvinyl alcohol, sodium chloride flush, traMADol   Assessment:   Morbidly obese 52 year old male with type 2 diabetes, sleep apnea and ischemic cardiomyopathy with EF of 10%.  Pre and postoperative RV function appear to be adequate.  Patient was extubated postop day 1 and has been mobilized out of bed to the chair with PA catheter removed on postop day 4 following  Limited echo showing adequate RV function.  Plan/Discussion:    Will continue Lasix drip for CVP 12-14 Wean inotropes slowly, first reduction will be in the vasopressin for MAP greater than 85. Plan removal of remaining pump pocket drain in a.m. and continuing on oral Coumadin load.   I reviewed the LVAD parameters from today, and compared the results to the patient's prior recorded data.  No programming changes were made.  The LVAD is functioning within specified parameters.  The patient performs LVAD self-test daily.  LVAD interrogation was negative for any significant power changes, alarms or PI events/speed drops.  LVAD equipment check completed and is in good working order.  Back-up equipment present.   LVAD education done on emergency procedures and precautions and reviewed exit site care.  Length of Stay: 7  Lovett Sox 05/07/2023, 3:10 PM

## 2023-05-08 ENCOUNTER — Inpatient Hospital Stay (HOSPITAL_COMMUNITY): Payer: 59

## 2023-05-08 DIAGNOSIS — I5022 Chronic systolic (congestive) heart failure: Secondary | ICD-10-CM | POA: Diagnosis not present

## 2023-05-08 DIAGNOSIS — R5381 Other malaise: Secondary | ICD-10-CM

## 2023-05-08 DIAGNOSIS — I5023 Acute on chronic systolic (congestive) heart failure: Secondary | ICD-10-CM | POA: Diagnosis not present

## 2023-05-08 DIAGNOSIS — I255 Ischemic cardiomyopathy: Secondary | ICD-10-CM | POA: Diagnosis not present

## 2023-05-08 DIAGNOSIS — I509 Heart failure, unspecified: Secondary | ICD-10-CM | POA: Diagnosis not present

## 2023-05-08 DIAGNOSIS — Z95811 Presence of heart assist device: Secondary | ICD-10-CM | POA: Diagnosis not present

## 2023-05-08 LAB — LACTATE DEHYDROGENASE: LDH: 260 U/L — ABNORMAL HIGH (ref 98–192)

## 2023-05-08 LAB — CBC WITH DIFFERENTIAL/PLATELET
Abs Immature Granulocytes: 0.41 10*3/uL — ABNORMAL HIGH (ref 0.00–0.07)
Basophils Absolute: 0.1 10*3/uL (ref 0.0–0.1)
Basophils Relative: 0 %
Eosinophils Absolute: 0.2 10*3/uL (ref 0.0–0.5)
Eosinophils Relative: 2 %
HCT: 24.4 % — ABNORMAL LOW (ref 39.0–52.0)
Hemoglobin: 8.1 g/dL — ABNORMAL LOW (ref 13.0–17.0)
Immature Granulocytes: 4 %
Lymphocytes Relative: 9 %
Lymphs Abs: 1 10*3/uL (ref 0.7–4.0)
MCH: 32.5 pg (ref 26.0–34.0)
MCHC: 33.2 g/dL (ref 30.0–36.0)
MCV: 98 fL (ref 80.0–100.0)
Monocytes Absolute: 1.2 10*3/uL — ABNORMAL HIGH (ref 0.1–1.0)
Monocytes Relative: 11 %
Neutro Abs: 8.5 10*3/uL — ABNORMAL HIGH (ref 1.7–7.7)
Neutrophils Relative %: 74 %
Platelets: 188 10*3/uL (ref 150–400)
RBC: 2.49 MIL/uL — ABNORMAL LOW (ref 4.22–5.81)
RDW: 16.8 % — ABNORMAL HIGH (ref 11.5–15.5)
WBC: 11.3 10*3/uL — ABNORMAL HIGH (ref 4.0–10.5)
nRBC: 4.3 % — ABNORMAL HIGH (ref 0.0–0.2)

## 2023-05-08 LAB — COOXEMETRY PANEL
Carboxyhemoglobin: 2.1 % — ABNORMAL HIGH (ref 0.5–1.5)
Carboxyhemoglobin: 1.9 % — ABNORMAL HIGH (ref 0.5–1.5)
Methemoglobin: <0.7 % (ref 0.0–1.5)
Methemoglobin: <0.7 % (ref 0.0–1.5)
O2 Saturation: 58.2 %
O2 Saturation: 57.3 %
Total hemoglobin: 8.4 g/dL — ABNORMAL LOW (ref 12.0–16.0)
Total hemoglobin: 7.8 g/dL — ABNORMAL LOW (ref 12.0–16.0)

## 2023-05-08 LAB — BASIC METABOLIC PANEL
Anion gap: 10 (ref 5–15)
BUN: 15 mg/dL (ref 6–20)
CO2: 29 mmol/L (ref 22–32)
Calcium: 8.6 mg/dL — ABNORMAL LOW (ref 8.9–10.3)
Chloride: 95 mmol/L — ABNORMAL LOW (ref 98–111)
Creatinine, Ser: 1.04 mg/dL (ref 0.61–1.24)
GFR, Estimated: >60 mL/min (ref >60)
Glucose, Bld: 127 mg/dL — ABNORMAL HIGH (ref 70–99)
Potassium: 3.5 mmol/L (ref 3.5–5.1)
Sodium: 134 mmol/L — ABNORMAL LOW (ref 135–145)

## 2023-05-08 LAB — GLUCOSE, CAPILLARY
Glucose-Capillary: 133 mg/dL — ABNORMAL HIGH (ref 70–99)
Glucose-Capillary: 183 mg/dL — ABNORMAL HIGH (ref 70–99)
Glucose-Capillary: 174 mg/dL — ABNORMAL HIGH (ref 70–99)
Glucose-Capillary: 110 mg/dL — ABNORMAL HIGH (ref 70–99)
Glucose-Capillary: 80 mg/dL (ref 70–99)

## 2023-05-08 LAB — PROTIME-INR
INR: 2 — ABNORMAL HIGH (ref 0.8–1.2)
Prothrombin Time: 23 s — ABNORMAL HIGH (ref 11.4–15.2)

## 2023-05-08 LAB — MAGNESIUM: Magnesium: 2 mg/dL (ref 1.7–2.4)

## 2023-05-08 LAB — HEPATIC FUNCTION PANEL
ALT: 21 U/L (ref 0–44)
AST: 44 U/L — ABNORMAL HIGH (ref 15–41)
Albumin: 2.7 g/dL — ABNORMAL LOW (ref 3.5–5.0)
Alkaline Phosphatase: 81 U/L (ref 38–126)
Bilirubin, Direct: 0.6 mg/dL — ABNORMAL HIGH (ref 0.0–0.2)
Indirect Bilirubin: 0.7 mg/dL (ref 0.3–0.9)
Total Bilirubin: 1.3 mg/dL — ABNORMAL HIGH (ref <1.2)
Total Protein: 5.8 g/dL — ABNORMAL LOW (ref 6.5–8.1)

## 2023-05-08 LAB — APTT: aPTT: 34 s (ref 24–36)

## 2023-05-08 LAB — PHOSPHORUS: Phosphorus: 3.1 mg/dL (ref 2.5–4.6)

## 2023-05-08 MED ORDER — WARFARIN SODIUM 2.5 MG PO TABS
2.5000 mg | ORAL_TABLET | Freq: Once | ORAL | Status: AC
  Administered 2023-05-08: 2.5 mg via ORAL
  Filled 2023-05-08: qty 1

## 2023-05-08 MED ORDER — POTASSIUM CHLORIDE CRYS ER 20 MEQ PO TBCR
20.0000 meq | EXTENDED_RELEASE_TABLET | ORAL | Status: AC
  Administered 2023-05-08 (×3): 20 meq via ORAL
  Filled 2023-05-08 (×3): qty 1

## 2023-05-08 MED ORDER — FE FUM-VIT C-VIT B12-FA 460-60-0.01-1 MG PO CAPS
1.0000 | ORAL_CAPSULE | Freq: Every day | ORAL | Status: DC
  Administered 2023-05-08 – 2023-05-21 (×14): 1 via ORAL
  Filled 2023-05-08 (×14): qty 1

## 2023-05-08 MED ORDER — METOLAZONE 5 MG PO TABS
5.0000 mg | ORAL_TABLET | Freq: Once | ORAL | Status: AC
  Administered 2023-05-08: 5 mg via ORAL
  Filled 2023-05-08: qty 1

## 2023-05-08 NOTE — Progress Notes (Signed)
LVAD Coordinator Rounding Note:   Admitted 04/30/23 due to for optimization prior to VAD.    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   Pt lying in bed on my arrival. Wife at bedside. Pocket drain and foley catheter removed this morning. Bedside RN to pull introducer.   Speed increased to 6200 this morning per Echo yesterday.   Vital signs: Temp: 98 HR: 80 Doppler Pressure:82 Automatic BP: 105/68 (82) O2 Sat: 95% 3L Drake Wt:316.4>305.5>317.9lbs     LVAD interrogation reveals:  Speed: 6200 Flow: 5.9 Power: 5.2w PI: 3.6 Alarms: none Events: 13 PI Events  Hematocrit: 24 Fixed speed: 6200 Low speed limit: 5900   Drive Line: Existing VAD dressing removed and site care performed using sterile technique by pt's wife Lenore. Drive line exit site cleaned with Chlora prep applicators x 2, rinsed with sterile saline and allowed to dry. Silver strip applied flush with exit site. Exit site is unincorporated, the velour is fully implanted at exit site with 1 suture in place. Mild amount bloody drainage. No redness, tenderness, foul odor or rash noted. Drive line anchor re-applied. Continue daily dressing changes by nurse champion or VAD Coordinator. Next dressing change 05/09/23.         Labs:  LDH trend: 275>251>260   Hgb trend: 9.9>8>8.1   INR trend: 1.3>1.4>2.0   Anticoagulation Plan: -INR Goal: 2.0-2.5 -ASA Dose: 325mg    Blood Products:  Intra-op- 6 FFP 1 PLT 2 CYRO   05/05/23>>1 u/PRBC   Device: -Medtronic BiV -Therapies: OFF   Arrythmias:    Respiratory: Extubated 05/04/23   Infection:    Renal:  -BUN/CRT: 16/1.42>21/1.14>15/1.04   VAD Education: Wife performed dressing change at bedside today with verbal cues from this VAD Coordinator.   Adverse Events on VAD:   Plan/Recommendations:  1. Page VAD coordinators for any equipment or drive line issues 2. Daily drive line dressing changes per nurse  champion or VAD Coordinator   Simmie Davies RN, BSN VAD Coordinator 24/7 Pager [phone]

## 2023-05-08 NOTE — Progress Notes (Signed)
HeartMate 3 Rounding Note Status post implantation of HeartMate 3 May 03, 2023  Subjective:   Postop day 5 HeartMate 3 implant Patient had a stable day with increased ambulation in the hallway, removal of the remaining pocket drain, and progress with weaning the inotropes-patient now on epinephrine 4 mcg and milrinone.  VAD parameters have been stable \\Speed  increased to 6200 RPM with VAD flows 5.6-5.8 L  INR now 2.0 after second dose of Coumadin Good urine output after twice daily Lasix dosing 40 mg IV.  Foley catheter has been removed.    Exit site of the driveline is clean and dry. LVAD INTERROGATION:  HeartMate II LVAD:  Flow 5.6 liters/min, speed 6200, power 3.2, PI 4.1.  Controller intact.   Objective:    Vital Signs:   Temp:  [98 F (36.7 C)-100.6 F (38.1 C)] 98 F (36.7 C) (11/19 1112) Pulse Rate:  [75-176] 163 (11/19 1530) Resp:  [12-34] 16 (11/19 1545) BP: (80-136)/(51-118) 91/76 (11/19 1545) SpO2:  [70 %-100 %] 100 % (11/19 1530) Weight:  [144.2 kg] 144.2 kg (11/19 0500) Last BM Date : 05/07/23 Mean arterial Pressure 80-85  Intake/Output:   Intake/Output Summary (Last 24 hours) at 05/08/2023 1657 Last data filed at 05/08/2023 1500 Gross per 24 hour  Intake 777.54 ml  Output 3790 ml  Net -3012.46 ml     Physical Exam: General:  Well appearing. No resp difficulty HEENT: normal Neck: supple. JVP . Carotids no bruits. No lymphadenopathy or thryomegaly appreciated. Cor: Mechanical heart sounds with LVAD hum present. Lungs: clear Abdomen: soft, nontender, nondistended. No hepatosplenomegaly. No bruits or masses. Good bowel sounds. Extremities: no cyanosis, clubbing, rash, edema Neuro: alert & orientedx3, cranial nerves grossly intact. moves all 4 extremities w/o difficulty. Affect pleasant  Telemetry: A-V sequentially paced at 80 bpm  Labs: Basic Metabolic Panel: Recent Labs  Lab 05/04/23 0309 05/04/23 0426 05/04/23 1700 05/05/23 0438  05/06/23 0414 05/07/23 0400 05/08/23 0450  NA 136   < > 137 132* 135 137 134*  K 4.1   < > 3.9 4.2 4.3 3.3* 3.5  CL 105  --  105 102 100 98 95*  CO2 20*  --  22 22 27 30 29   GLUCOSE 153*  --  166* 216* 198* 165* 127*  BUN 16  --  13 14 18  21* 15  CREATININE 1.42*  --  1.47* 1.01 0.99 1.14 1.04  CALCIUM 8.2*  --  8.4* 8.1* 8.8* 8.1* 8.6*  MG  --   --  2.3 2.3 2.2 1.9 2.0  PHOS 3.7  --   --  2.7 2.5 3.2 3.1   < > = values in this interval not displayed.    Liver Function Tests: Recent Labs  Lab 05/04/23 0309 05/05/23 0438 05/06/23 0414 05/08/23 0450  AST 63* 43* 34 44*  ALT 17 16 17 21   ALKPHOS 45 45 53 81  BILITOT 2.1* 2.4* 2.2* 1.3*  PROT 5.8* 5.7* 5.9* 5.8*  ALBUMIN 3.4* 3.1* 3.0* 2.7*   No results for input(s): "LIPASE", "AMYLASE" in the last 168 hours. No results for input(s): "AMMONIA" in the last 168 hours.  CBC: Recent Labs  Lab 05/04/23 0309 05/04/23 0426 05/04/23 1700 05/05/23 0438 05/06/23 0414 05/07/23 0400 05/08/23 0450  WBC 16.0*  --  17.4* 18.5* 16.3* 13.2* 11.3*  NEUTROABS 13.1*  --   --  15.1* 13.6* 10.2* 8.5*  HGB 9.9*   < > 8.5* 8.0* 8.3* 8.0* 8.1*  HCT 28.6*   < >  25.5* 24.9* 25.8* 24.3* 24.4*  MCV 94.1  --  94.8 99.2 97.0 98.0 98.0  PLT 163  --  150 132* 149* 160 188   < > = values in this interval not displayed.    INR: Recent Labs  Lab 05/04/23 0309 05/05/23 0438 05/06/23 0414 05/07/23 0400 05/08/23 0450  INR 1.3* 1.6* 1.4* 1.4* 2.0*    Other results: EKG:   Imaging: DG Chest Port 1 View  Result Date: 05/08/2023 CLINICAL DATA:  Left ventricular assist device. EXAM: PORTABLE CHEST 1 VIEW COMPARISON:  May 06, 2023. FINDINGS: Stable cardiomegaly. Left ventricular assist device is unchanged. Left-sided pacemaker is unchanged. Swan-Ganz catheter has been removed. Right-sided PICC line is unchanged. Left basilar atelectasis is noted with probable small pleural effusion. IMPRESSION: Left ventricular assist device is again noted  as well as left-sided pacemaker. Stable left basilar atelectasis with probable small pleural effusion. Electronically Signed   By: Lupita Raider M.D.   On: 05/08/2023 11:26   ECHOCARDIOGRAM LIMITED  Result Date: 05/07/2023    ECHOCARDIOGRAM LIMITED REPORT   Patient Name:   JAVONI DESANTOS [facility] Date of Exam: 05/07/2023 Medical Rec #:  [id number]              Height:       75.0 in Accession #:    [id number]             Weight:       305.6 lb Date of Birth:  [birth year]               BSA:          2.627 m Patient Age:    52 years               BP:           95/58 mmHg Patient Gender: M                      HR:           80 bpm. Exam Location:  Inpatient Procedure: Limited Echo and Limited Color Doppler Indications:    LVAD evaluation  History:        Patient has prior history of Echocardiogram examinations, most                 recent 05/03/2023. Cardiomyopathy and CHF, CAD, Defibrillator,                 Arrythmias:RBBB; Risk Factors:Diabetes, Sleep Apnea and                 Hypertension.  Sonographer:    Milda Smart Referring Phys: [phone] Toluwani Yadav  Sonographer Comments: Image acquisition challenging due to patient body habitus. IMPRESSIONS  1. Limited study due to technically difficult images. HeartMate 3 LVAD at 6000 rpm.  2. The aortic valve opens with each beat. There is no meaningful pericardial effusion.  3. The left ventricle is decompressed and has severely reduced systolic function. Cannot assess regional wall motion.  4. The right ventricle is normal in size and function. A defibrillator lead is seen. FINDINGS  Additional Comments: Color Doppler performed.  LEFT VENTRICLE PLAX 2D LVIDd:         5.10 cm LVIDs:         4.50 cm LV PW:         1.00 cm LV IVS:        0.90 cm  Rachelle Hora Croitoru MD Electronically signed by  Mihai Croitoru MD Signature Date/Time: 05/07/2023/6:01:37 PM    Final      Medications:     Scheduled Medications:  acetaminophen  1,000 mg Oral Q6H   Or   acetaminophen  (TYLENOL) oral liquid 160 mg/5 mL  1,000 mg Per Tube Q6H   allopurinol  100 mg Oral Daily   amiodarone  200 mg Oral BID   aspirin EC  325 mg Oral Daily   Or   aspirin  324 mg Per Tube Daily   Or   aspirin  300 mg Rectal Daily   bisacodyl  10 mg Oral Daily   Or   bisacodyl  10 mg Rectal Daily   Chlorhexidine Gluconate Cloth  6 each Topical Daily   escitalopram  10 mg Oral QHS   ezetimibe  10 mg Oral Daily   Fe Fum-Vit C-Vit B12-FA  1 capsule Oral QPC breakfast   furosemide  40 mg Intravenous BID   gabapentin  400 mg Oral TID   insulin aspart  0-24 Units Subcutaneous TID WC & HS   insulin aspart  6 Units Subcutaneous TID WC   insulin detemir  38 Units Subcutaneous BID   levothyroxine  25 mcg Oral Daily   metoCLOPramide (REGLAN) injection  10 mg Intravenous Q6H   mexiletine  250 mg Oral Q12H   potassium chloride  20 mEq Oral Q4H   rosuvastatin  40 mg Oral Daily   sodium chloride flush  10-40 mL Intracatheter Q12H   sodium chloride flush  3 mL Intravenous Q12H   warfarin  2.5 mg Oral ONCE-1600   Warfarin - Pharmacist Dosing Inpatient   Does not apply q1600    Infusions:  epinephrine 4 mcg/min (05/08/23 1500)   milrinone 0.25 mcg/kg/min (05/08/23 1500)    PRN Medications: acetaminophen, dextrose, ondansetron (ZOFRAN) IV, mouth rinse, oxyCODONE, polyvinyl alcohol, sodium chloride flush, traMADol   Assessment:   Morbidly obese 52 year old male with type 2 diabetes, sleep apnea and ischemic cardiomyopathy with EF of 10%.  Pre and postoperative RV function appear to be adequate.  Patient was extubated postop day 1 and has been mobilized out of bed to the chair with PA catheter removed on postop day 4 following Limited echo showing adequate RV function.  Inotropes weaned to low-dose epinephrine and milrinone postop day 5.  Plan/Discussion:    Continue to wean inotropes slowly Plan on removing IJ sheath from the OR and using PICC line for inotropes, CVP, and blood draws.   I  reviewed the LVAD parameters from today, and compared the results to the patient's prior recorded data.  No programming changes were made.  The LVAD is functioning within specified parameters.  The patient performs LVAD self-test daily.  LVAD interrogation was negative for any significant power changes, alarms or PI events/speed drops.  LVAD equipment check completed and is in good working order.  Back-up equipment present.   LVAD education done on emergency procedures and precautions and reviewed exit site care.  Length of Stay: 8  Lovett Sox 05/08/2023, 4:57 PM

## 2023-05-08 NOTE — Progress Notes (Signed)
Physical Therapy Treatment Patient Details Name: Joe [name] MRN: [id number] DOB: 08-11-1970 Today's Date: 05/08/2023   History of Present Illness 52 yo male admitted 04/30/23 for optimization with Heartmate III LVAD placed 11/14.  PMH: morbid obesity, DM2, HTN, HLD, OSA, CAD and systolic HF (EF 20-25%)    PT Comments  Pt pleasant in chair on arrival and returned to bed after gait for line removal. Pt able to perform power transition with supervision for safety and min cues. Pt progression well with limited assist for all mobility and requires education for precautions and management of LVAD equipment.   Speed 6200, flow 5.7-5.9, PI 3.7-3.9, power 5.3 HR 80    If plan is discharge home, recommend the following: A little help with walking and/or transfers;A little help with bathing/dressing/bathroom;Assist for transportation;Assistance with cooking/housework   Can travel by Pension scheme manager (4 wheels)    Recommendations for Other Services       Precautions / Restrictions Precautions Precautions: Sternal;Other (comment) Precaution Booklet Issued: Yes (comment) Precaution Comments: LVAD, pt able to state 3/4 sternal precautions and recall items in backup bag end of session, chest tube Restrictions Weight Bearing Restrictions: Yes (sternal precaitions) RUE Weight Bearing: Non weight bearing LUE Weight Bearing: Non weight bearing     Mobility  Bed Mobility Overal bed mobility: Needs Assistance Bed Mobility: Sit to Supine       Sit to supine: Mod assist   General bed mobility comments: mod assist to lift legs to surface and position in midline with cues for sequence and precautions    Transfers Overall transfer level: Needs assistance   Transfers: Sit to/from Stand Sit to Stand: Contact guard assist           General transfer comment: pt able stand from chair with tactile cues, then stood from eOB x 5 trials  without cues with increased time    Ambulation/Gait Ambulation/Gait assistance: Contact guard assist Gait Distance (Feet): 150 Feet Assistive device: Rolling walker (2 wheels) Gait Pattern/deviations: Step-through pattern, Decreased stride length, Trunk flexed   Gait velocity interpretation: 1.31 - 2.62 ft/sec, indicative of limited community ambulator   General Gait Details: cues for safety with assist for line management   Stairs             Wheelchair Mobility     Tilt Bed    Modified Rankin (Stroke Patients Only)       Balance Overall balance assessment: Needs assistance Sitting-balance support: Feet supported, No upper extremity supported Sitting balance-Leahy Scale: Good     Standing balance support: During functional activity, Bilateral upper extremity supported, Reliant on assistive device for balance Standing balance-Leahy Scale: Poor Standing balance comment: Rw in standing                            Cognition Arousal: Alert Behavior During Therapy: WFL for tasks assessed/performed Overall Cognitive Status: Within Functional Limits for tasks assessed                                 General Comments: needed cues to recall all sternal precautions and items in back up bag        Exercises      General Comments General comments (skin integrity, edema, etc.): VSS.      Pertinent Vitals/Pain Pain Assessment Faces Pain Scale:  Hurts little more Pain Location: pocket pain Pain Descriptors / Indicators: Aching Pain Intervention(s): Limited activity within patient's tolerance, Repositioned, Monitored during session    Home Living Family/patient expects to be discharged to:: Private residence Living Arrangements: Spouse/significant other Available Help at Discharge: Family;Available 24 hours/day Type of Home: House Home Access: Ramped entrance       Home Layout: One level Home Equipment: Shower seat;Hospital bed;Other  (comment) (tall rollator) Additional Comments: recently built a fully handicapped accessible home    Prior Function            PT Goals (current goals can now be found in the care plan section) Progress towards PT goals: Progressing toward goals    Frequency    Min 1X/week      PT Plan      Co-evaluation              AM-PAC PT "6 Clicks" Mobility   Outcome Measure  Help needed turning from your back to your side while in a flat bed without using bedrails?: A Lot Help needed moving from lying on your back to sitting on the side of a flat bed without using bedrails?: A Lot Help needed moving to and from a bed to a chair (including a wheelchair)?: A Little Help needed standing up from a chair using your arms (e.g., wheelchair or bedside chair)?: A Little Help needed to walk in hospital room?: A Little Help needed climbing 3-5 steps with a railing? : Total 6 Click Score: 14    End of Session Equipment Utilized During Treatment: Oxygen Activity Tolerance: Patient tolerated treatment well Patient left: with family/visitor present;with call bell/phone within reach;in bed Nurse Communication: Mobility status PT Visit Diagnosis: Other abnormalities of gait and mobility (R26.89);Muscle weakness (generalized) (M62.81)     Time: 1610-9604 PT Time Calculation (min) (ACUTE ONLY): 34 min  Charges:    $Gait Training: 8-22 mins $Therapeutic Activity: 8-22 mins PT General Charges $$ ACUTE PT VISIT: 1 Visit                     Merryl Hacker, PT Acute Rehabilitation Services Office: [phone]    Jerlyn Pain B Lismary Kiehn 05/08/2023, 11:19 AM

## 2023-05-08 NOTE — Progress Notes (Signed)
Chaplain received PMT referral for Pt. F/U spiritual care. Pt. participating in patient care at the time of the visit. This chaplain will attempt a revisit.  Chaplain Stephanie Acre [phone]

## 2023-05-08 NOTE — Evaluation (Addendum)
Occupational Therapy Evaluation Patient Details Name: Joe [name] MRN: [id number] DOB: 1970-08-25 Today's Date: 05/08/2023   History of Present Illness 52 yo male admitted 04/30/23 for optimization with Heartmate III LVAD placed 11/14.  PMH: morbid obesity, DM2, HTN, HLD, OSA, CAD and systolic HF (EF 20-25%)   Clinical Impression   PTA, pt lived with his wife who intermittently assisted with LB ADL and performed most cooking/cleaning within the home. Upon eval, pt with decreased balance, strength, activity tolerance, and knowledge of functional implementation of precautions. Requiring up to min A for UB ADL and mod A for LB ADL. Will continue to follow. Due to significant change in functional status, recommending intensive multidisciplinary rehabilitation >3 hours/day to optimize safety and independence in ADL.   Pump flow: 5.5 Speed 6100 Pulse index: 3.7 Pump power: 5.1      If plan is discharge home, recommend the following: A little help with walking and/or transfers;A lot of help with bathing/dressing/bathroom;Assistance with cooking/housework;Assist for transportation;Help with stairs or ramp for entrance    Functional Status Assessment  Patient has had a recent decline in their functional status and demonstrates the ability to make significant improvements in function in a reasonable and predictable amount of time.  Equipment Recommendations  Other (comment) (defer)    Recommendations for Other Services Rehab consult     Precautions / Restrictions Precautions Precautions: Sternal Precaution Booklet Issued: Yes (comment) Precaution Comments: Pt able to recall sternal precautions on arrival. Bringing handout for furture ADL and energy conservatiohn education. 1-2 cues to maintain precautions during mobility Restrictions Weight Bearing Restrictions: Yes (sternal precaitions)      Mobility Bed Mobility Overal bed mobility: Needs Assistance Bed Mobility: Supine to  Sit     Supine to sit: Min assist, HOB elevated     General bed mobility comments: HOB elvated to 50 degrees, pt able to bring LEs to EOB, pt hugged heart pillow rolled to the R onto R elbow, minA for trunk elevation and to scoot to EOB    Transfers Overall transfer level: Needs assistance Equipment used: 1 person hand held assist Transfers: Sit to/from Stand, Bed to chair/wheelchair/BSC Sit to Stand: Min assist     Step pivot transfers: Min assist, +2 safety/equipment     General transfer comment: Light assist for rise and steady. Assist for steadying during pivot +2 present to asisst with line management      Balance Overall balance assessment: Needs assistance Sitting-balance support: Feet supported, No upper extremity supported Sitting balance-Leahy Scale: Fair     Standing balance support: No upper extremity supported, During functional activity Standing balance-Leahy Scale: Poor Standing balance comment: dependent on external support                           ADL either performed or assessed with clinical judgement   ADL Overall ADL's : Needs assistance/impaired Eating/Feeding: Sitting;Modified independent   Grooming: Minimal assistance;Standing Grooming Details (indicate cue type and reason): light assist for standing balance Upper Body Bathing: Minimal assistance;Sitting   Lower Body Bathing: Moderate assistance;Sit to/from stand   Upper Body Dressing : Minimal assistance;Sitting   Lower Body Dressing: Moderate assistance;Sit to/from stand   Toilet Transfer: Minimal assistance;Stand-pivot   Toileting- Clothing Manipulation and Hygiene: Total assistance;Sit to/from stand Toileting - Clothing Manipulation Details (indicate cue type and reason): RN providing posterior pericare as pt with bowel incontinence on standing. Demonstrated anticipatory awareness prior stating my bowels have moved every time I  have stood up     Functional mobility during  ADLs: Minimal assistance General ADL Comments: 1 person HHA     Vision Baseline Vision/History:  (wears contacts) Ability to See in Adequate Light: 0 Adequate Patient Visual Report: No change from baseline Vision Assessment?: No apparent visual deficits     Perception Perception: Not tested       Praxis Praxis: Not tested       Pertinent Vitals/Pain Pain Assessment Pain Assessment: Faces Faces Pain Scale: Hurts little more Pain Location: L chest area Pain Descriptors / Indicators: Sharp Pain Intervention(s): Limited activity within patient's tolerance, Monitored during session (RN aware)     Extremity/Trunk Assessment Upper Extremity Assessment Upper Extremity Assessment: Generalized weakness;Right hand dominant   Lower Extremity Assessment Lower Extremity Assessment: Defer to PT evaluation   Cervical / Trunk Assessment Cervical / Trunk Assessment: Other exceptions Cervical / Trunk Exceptions: sternal incision, chest tubes, LVAD   Communication Communication Communication: No apparent difficulties   Cognition Arousal: Alert Behavior During Therapy: WFL for tasks assessed/performed, Flat affect Overall Cognitive Status: Within Functional Limits for tasks assessed                                 General Comments: oriented, able to recall hospital course and all precautions     General Comments  VSS.    Exercises     Shoulder Instructions      Home Living Family/patient expects to be discharged to:: Private residence Living Arrangements: Spouse/significant other Available Help at Discharge: Family;Available 24 hours/day Type of Home: House Home Access: Ramped entrance     Home Layout: One level     Bathroom Shower/Tub: Producer, television/film/video: Handicapped height     Home Equipment: Shower seat;Hospital bed;Other (comment) (tall rollator)   Additional Comments: recently built a fully handicapped accessible home      Prior  Functioning/Environment Prior Level of Function : Independent/Modified Independent             Mobility Comments: no AD ADLs Comments: Intermittent assist from wife for LB ADL, very occasional assist for bathing, wife performs driving and most home making. Pt does grocery shop sometimes reporting use of electric cart as needed        OT Problem List: Decreased strength;Decreased activity tolerance;Impaired balance (sitting and/or standing);Decreased knowledge of use of DME or AE;Decreased knowledge of precautions;Cardiopulmonary status limiting activity      OT Treatment/Interventions: Self-care/ADL training;Therapeutic exercise;DME and/or AE instruction;Energy conservation;Balance training;Patient/family education;Therapeutic activities    OT Goals(Current goals can be found in the care plan section) Acute Rehab OT Goals Patient Stated Goal: get better OT Goal Formulation: With patient Time For Goal Achievement: 05/22/23 Potential to Achieve Goals: Good  OT Frequency: Min 1X/week    Co-evaluation              AM-PAC OT "6 Clicks" Daily Activity     Outcome Measure Help from another person eating meals?: None Help from another person taking care of personal grooming?: A Little Help from another person toileting, which includes using toliet, bedpan, or urinal?: A Lot Help from another person bathing (including washing, rinsing, drying)?: A Lot Help from another person to put on and taking off regular upper body clothing?: A Little Help from another person to put on and taking off regular lower body clothing?: A Lot 6 Click Score: 16   End of Session Equipment Utilized  During Treatment: Oxygen (3L) Nurse Communication: Mobility status  Activity Tolerance: Patient tolerated treatment well Patient left: in chair;with call bell/phone within reach;with nursing/sitter in room  OT Visit Diagnosis: Unsteadiness on feet (R26.81);Muscle weakness (generalized) (M62.81);Other  (comment) (decreased activity tolerance)                Time: 3244-0102 OT Time Calculation (min): 31 min Charges:  OT General Charges $OT Visit: 1 Visit OT Evaluation $OT Eval Moderate Complexity: 1 Mod OT Treatments $Self Care/Home Management : 8-22 mins  Tyler Deis, OTR/L [facility] Acute Rehabilitation Office: [phone]   Myrla Halsted 05/08/2023, 8:51 AM

## 2023-05-08 NOTE — Progress Notes (Signed)
Inpatient Rehab Coordinator Note:  I met with patient and his spouse, Lenore, at bedside to discuss CIR recommendations and goals/expectations of CIR stay.  We reviewed 3 hrs/day of therapy, physician follow up, and average length of stay 2 weeks (dependent upon progress) with goals of supervision to modified independence.  We reviewed steps to medical readiness for CIR (weaned off gtt medications, etc), and need for prior auth.  Also reviewed progress with therapy.  We will continue to follow.   Estill Dooms, PT, DPT Admissions Coordinator [phone] 05/08/23  12:09 PM

## 2023-05-08 NOTE — Consult Note (Signed)
Physical Medicine and Rehabilitation Consult Reason for Consult: Debility related to end-stage cardiomyopathy and subsequent LVAD placement Referring Physician: Bensimhon    HPI: Joe [name] is a 52 y.o. male with a history of morbid obesity as well as recent VTE arrest, congestive heart failure and ischemic cardiomyopathy who was admitted on 04/30/2023 for LVAD placement which was ultimately performed on 05/03/2023.  He is being actively followed by cardiothoracic surgery and cardiology for postop course.  Patient still has been volume overloaded and Lasix drip recently transition to IV Lasix twice daily.  He was evaluated by physical therapy on 05/06/2023 and was mod assist for supine to sit transfers and mod assist for sit to stand transfers as well.  Patient stood but did not ambulate due to [employer].  Prior to arrival patient lived at home with his spouse in a 1 level house with ramped entry.  Patient was independent prior to arrival.   Review of Systems  Constitutional: Negative.   HENT: Negative.    Eyes: Negative.   Respiratory:  Positive for cough and shortness of breath.   Cardiovascular:  Positive for chest pain and leg swelling.  Gastrointestinal: Negative.   Genitourinary:  Negative for dysuria.  Musculoskeletal:  Positive for myalgias.  Skin: Negative.   Neurological:  Positive for weakness.  Psychiatric/Behavioral:  The patient is not nervous/anxious.    Past Medical History:  Diagnosis Date   Abnormal EKG    Acute systolic heart failure (HCC) 09/06/2017   Angina pectoris (HCC) 09/05/2017   Body mass index 45.0-49.9, adult (HCC)    CAD S/P percutaneous coronary angioplasty 09/07/2017   mLAD and dLAD PCI with DES 09/06/17   CHF (congestive heart failure) (HCC)    Chronic systolic (congestive) heart failure (HCC) 03/19/2019   Complication of anesthesia 1995   "had hard time waking up; breathing w/vasectomy"   Coronary artery disease    Erectile  dysfunction    Essential hypertension, benign    Fatigue    GERD (gastroesophageal reflux disease)    Gout    High cholesterol    "just started tx yesterday" (09/06/2017)   History of gout    "RX prn" (09/06/2017)   Ischemic cardiomyopathy 09/07/2017   EF 25-35%   Muscular chest pain    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   Below Left Nare (MOH's)   Obesity    Obstructive sleep apnea    OSA on CPAP    Plantar fasciitis    Pre-operative clearance 02/11/2018   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/02/2019   [employer]   Right bundle branch block    Shortness of breath 09/05/2017   Testosterone deficiency    Type 2 diabetes mellitus with complication, without long-term current use of insulin (HCC) 09/05/2017   Type II diabetes mellitus (HCC)    "started tx 08/28/2017"   Past Surgical History:  Procedure Laterality Date   BACK SURGERY     BIV ICD INSERTION CRT-D N/A 03/19/2019   Procedure: BIV ICD INSERTION CRT-D;  Surgeon: Regan Lemming, MD;  Location: [facility] INVASIVE CV LAB;  Service: Cardiovascular;  Laterality: N/A;   CORONARY ANGIOPLASTY WITH STENT PLACEMENT  09/06/2017   "3 stents"   CORONARY STENT INTERVENTION N/A 09/06/2017   Procedure: CORONARY STENT INTERVENTION;  Surgeon: Corky Crafts, MD;  Location: [facility] INVASIVE CV LAB;  Service: Cardiovascular;  Laterality: N/A;   CORONARY STENT INTERVENTION N/A 02/15/2018   Procedure: CORONARY STENT INTERVENTION;  Surgeon: Corky Crafts, MD;  Location: [facility] INVASIVE CV LAB;  Service: Cardiovascular;  Laterality: N/A;   INSERTION OF IMPLANTABLE LEFT VENTRICULAR ASSIST DEVICE N/A 05/03/2023   Procedure: INSERTION OF IMPLANTABLE LEFT VENTRICULAR ASSIST DEVICE - HEARTMATE 3;  Surgeon: Lovett Sox, MD;  Location: [facility] OR;  Service: Open Heart Surgery;  Laterality: N/A;   KNEE ARTHROSCOPY Right    LEFT HEART CATH AND CORONARY ANGIOGRAPHY N/A 09/06/2017   Procedure: LEFT HEART CATH AND  CORONARY ANGIOGRAPHY;  Surgeon: Corky Crafts, MD;  Location: [facility] INVASIVE CV LAB;  Service: Cardiovascular;  Laterality: N/A;   LEFT HEART CATH AND CORONARY ANGIOGRAPHY N/A 02/15/2018   Procedure: LEFT HEART CATH AND CORONARY ANGIOGRAPHY;  Surgeon: Corky Crafts, MD;  Location: [facility] INVASIVE CV LAB;  Service: Cardiovascular;  Laterality: N/A;   LEFT HEART CATH AND CORONARY ANGIOGRAPHY N/A 06/27/2018   Procedure: LEFT HEART CATH AND CORONARY ANGIOGRAPHY;  Surgeon: Corky Crafts, MD;  Location: [facility] INVASIVE CV LAB;  Service: Cardiovascular;  Laterality: N/A;   LEFT HEART CATH AND CORONARY ANGIOGRAPHY N/A 06/28/2020   Procedure: LEFT HEART CATH AND CORONARY ANGIOGRAPHY;  Surgeon: Swaziland, Peter M, MD;  Location: [facility] INVASIVE CV LAB;  Service: Cardiovascular;  Laterality: N/A;   LUMBAR SPINE SURGERY  ~ 2001   Intradiscal Electrothermoplasty   RIGHT HEART CATH N/A 04/30/2023   Procedure: RIGHT HEART CATH;  Surgeon: Dolores Patty, MD;  Location: [facility] INVASIVE CV LAB;  Service: Cardiovascular;  Laterality: N/A;   RIGHT/LEFT HEART CATH AND CORONARY ANGIOGRAPHY N/A 03/07/2021   Procedure: RIGHT/LEFT HEART CATH AND CORONARY ANGIOGRAPHY;  Surgeon: Laurey Morale, MD;  Location: [facility] INVASIVE CV LAB;  Service: Cardiovascular;  Laterality: N/A;   RIGHT/LEFT HEART CATH AND CORONARY ANGIOGRAPHY N/A 02/23/2023   Procedure: RIGHT/LEFT HEART CATH AND CORONARY ANGIOGRAPHY;  Surgeon: Dolores Patty, MD;  Location: [facility] INVASIVE CV LAB;  Service: Cardiovascular;  Laterality: N/A;   TEE WITHOUT CARDIOVERSION N/A 05/03/2023   Procedure: TRANSESOPHAGEAL ECHOCARDIOGRAM;  Surgeon: Lovett Sox, MD;  Location: [facility] OR;  Service: Open Heart Surgery;  Laterality: N/A;   VASECTOMY  1995   Family History  Problem Relation Age of Onset   Lung cancer Mother    Brain cancer Mother    Lymphoma Father    Heart disease Brother    Heart attack Brother    Diabetes Mellitus II Brother    Diabetes Mellitus II  Paternal Grandfather    Social History:  reports that he has never smoked. He has never used smokeless tobacco. He reports that he does not drink alcohol and does not use drugs. Allergies: No Known Allergies Medications Prior to Admission  Medication Sig Dispense Refill   acetaminophen (TYLENOL) 500 MG tablet Take 1,000 mg by mouth every 6 (six) hours as needed for moderate pain or headache.     allopurinol (ZYLOPRIM) 100 MG tablet Take 100 mg by mouth daily.     amiodarone (PACERONE) 200 MG tablet Take 2 tablets (400 mg total) by mouth 2 (two) times daily for 7 days, THEN 1 tablet (200 mg total) 2 (two) times daily. 90 tablet 0   Aromatic Inhalants (VICKS VAPOINHALER) INHA Inhale 1 puff into the lungs daily as needed (congestion).     aspirin EC 81 MG tablet Take 1 tablet (81 mg total) by mouth daily. 90 tablet 3   colchicine 0.6 MG tablet Take 0.6 mg by mouth daily as needed (Gout).     diclofenac Sodium (VOLTAREN) 1 % GEL  APPLY 1 G TOPICALLY DAILY. (Patient taking differently: Apply 2 g topically daily as needed (for pain).) 100 g 0   empagliflozin (JARDIANCE) 10 MG TABS tablet Take 1 tablet (10 mg total) by mouth daily before breakfast. 30 tablet 11   escitalopram (LEXAPRO) 10 MG tablet Take 10 mg by mouth at bedtime.     gabapentin (NEURONTIN) 300 MG capsule Take 300 mg by mouth 3 (three) times daily.     levothyroxine (SYNTHROID) 25 MCG tablet Take 25 mcg by mouth daily.     magnesium oxide (MAG-OX) 400 (240 Mg) MG tablet Take 1 tablet (400 mg total) by mouth daily. 30 tablet 5   mexiletine (MEXITIL) 250 MG capsule Take 1 capsule (250 mg total) by mouth 2 (two) times daily. 60 capsule 11   omeprazole (PRILOSEC) 40 MG capsule Take 1 capsule (40 mg total) by mouth daily. 90 capsule 1   Polyethylene Glycol 400 (BLINK TEARS OP) Place 2 drops into both eyes 4 (four) times daily as needed (for dry eyes).     potassium chloride SA (KLOR-CON M) 20 MEQ tablet Take 2 tablets (40 mEq total) by mouth  3 (three) times daily. 540 tablet 1   ranolazine (RANEXA) 500 MG 12 hr tablet Take 1 tablet (500 mg total) by mouth 2 (two) times daily. 180 tablet 2   rosuvastatin (CRESTOR) 40 MG tablet Take 1 tablet (40 mg total) by mouth daily. (Patient taking differently: Take 40 mg by mouth at bedtime.) 90 tablet 2   spironolactone (ALDACTONE) 25 MG tablet Take 1 tablet (25 mg total) by mouth daily. 30 tablet 5   torsemide (DEMADEX) 20 MG tablet Take 3 tablets (60 mg total) by mouth 2 (two) times daily. 540 tablet 1   triamcinolone cream (KENALOG) 0.1 % Apply 1 Application topically 2 (two) times daily.     ENTRESTO 24-26 MG Take 1 tablet by mouth 2 (two) times daily.     nitroGLYCERIN (NITROSTAT) 0.4 MG SL tablet Place 1 tablet (0.4 mg total) under the tongue every 5 (five) minutes as needed for chest pain. 25 tablet 3   OZEMPIC, 1 MG/DOSE, 4 MG/3ML SOPN Inject 1 mg into the skin every Monday.      Home: Home Living Family/patient expects to be discharged to:: Private residence Living Arrangements: Spouse/significant other Available Help at Discharge: Family, Available 24 hours/day Type of Home: House Home Access: Ramped entrance Home Layout: One level Bathroom Shower/Tub: Health visitor: Handicapped height Home Equipment: Shower seat, Hospital bed, Other (comment) (tall rollator) Additional Comments: recently built a fully handicapped accessible home  Functional History: Prior Function Prior Level of Function : Independent/Modified Independent Mobility Comments: no AD ADLs Comments: Intermittent assist from wife for LB ADL, very occasional assist for bathing, wife performs driving and most home making. Pt does grocery shop sometimes reporting use of electric cart as needed Functional Status:  Mobility: Bed Mobility Overal bed mobility: Needs Assistance Bed Mobility: Supine to Sit Supine to sit: Min assist, HOB elevated General bed mobility comments: HOB elvated to 50 degrees,  pt able to bring LEs to EOB, pt hugged heart pillow rolled to the R onto R elbow, minA for trunk elevation and to scoot to EOB Transfers Overall transfer level: Needs assistance Equipment used: 1 person hand held assist Transfers: Sit to/from Stand, Bed to chair/wheelchair/BSC Sit to Stand: Min assist Bed to/from chair/wheelchair/BSC transfer type:: Step pivot Step pivot transfers: Min assist, +2 safety/equipment General transfer comment: Light assist for rise and  steady. Assist for steadying during pivot +2 present to asisst with line management Ambulation/Gait Ambulation/Gait assistance: Modified independent (Device/Increase time) Gait Distance (Feet): 746 Feet Assistive device: None (started with IV pole but then PT pushed it) Gait Pattern/deviations: WFL(Within Functional Limits) General Gait Details: unable due to swan line    ADL: ADL Overall ADL's : Needs assistance/impaired Eating/Feeding: Sitting, Modified independent Grooming: Minimal assistance, Standing Grooming Details (indicate cue type and reason): light assist for standing balance Upper Body Bathing: Minimal assistance, Sitting Lower Body Bathing: Moderate assistance, Sit to/from stand Upper Body Dressing : Minimal assistance, Sitting Lower Body Dressing: Moderate assistance, Sit to/from stand Toilet Transfer: Minimal assistance, Stand-pivot Toileting- Clothing Manipulation and Hygiene: Total assistance, Sit to/from stand Toileting - Clothing Manipulation Details (indicate cue type and reason): RN providing posterior pericare as pt with bowel incontinence on standing. Demonstrated anticipatory awareness prior stating my bowels have moved every time I have stood up Functional mobility during ADLs: Minimal assistance General ADL Comments: 1 person HHA  Cognition: Cognition Overall Cognitive Status: Within Functional Limits for tasks assessed Orientation Level: Oriented X4 Cognition Arousal: Alert Behavior During  Therapy: WFL for tasks assessed/performed, Flat affect Overall Cognitive Status: Within Functional Limits for tasks assessed General Comments: oriented, able to recall hospital course and all precautions  Blood pressure 110/62, pulse 80, temperature 99.9 F (37.7 C), resp. rate 17, height 6\' 3"  (1.905 m), weight (!) 144.2 kg, SpO2 94%. Physical Exam Constitutional:      Appearance: He is obese.  HENT:     Head: Normocephalic and atraumatic.     Nose: Nose normal.  Eyes:     Conjunctiva/sclera: Conjunctivae normal.  Cardiovascular:     Comments: LVAD Pulmonary:     Effort: Pulmonary effort is normal.  Abdominal:     Palpations: Abdomen is soft.  Musculoskeletal:        General: Swelling present.     Cervical back: Normal range of motion.     Right lower leg: Edema present.     Left lower leg: Edema present.  Skin:    General: Skin is warm.     Comments: Sternal incision CDI. LVAD drive site dressed  Neurological:     Mental Status: He is alert.     Comments: Alert and oriented x 3. Normal insight and awareness. Intact Memory. Normal language and speech. Cranial nerve exam unremarkable. MMT: 4/5 UE prox to distal. LE 4-/5 HF, KE and 4/5 ADF/PF. Fair standing balance with RW. No gross sensory deficits.     Psychiatric:        Mood and Affect: Mood normal.        Behavior: Behavior normal.     Results for orders placed or performed during the hospital encounter of 04/30/23 (from the past 24 hour(s))  Glucose, capillary     Status: Abnormal   Collection Time: 05/07/23 11:28 AM  Result Value Ref Range   Glucose-Capillary 180 (H) 70 - 99 mg/dL  Glucose, capillary     Status: Abnormal   Collection Time: 05/07/23  4:20 PM  Result Value Ref Range   Glucose-Capillary 175 (H) 70 - 99 mg/dL  Cooxemetry Panel (carboxy, met, total hgb, O2 sat)     Status: Abnormal   Collection Time: 05/07/23  4:58 PM  Result Value Ref Range   Total hemoglobin 8.3 (L) 12.0 - 16.0 g/dL   O2  Saturation 40.1 %   Carboxyhemoglobin 1.7 (H) 0.5 - 1.5 %   Methemoglobin 1.7 (H) 0.0 -  1.5 %  Glucose, capillary     Status: Abnormal   Collection Time: 05/07/23  7:33 PM  Result Value Ref Range   Glucose-Capillary 221 (H) 70 - 99 mg/dL  Glucose, capillary     Status: Abnormal   Collection Time: 05/07/23 11:16 PM  Result Value Ref Range   Glucose-Capillary 147 (H) 70 - 99 mg/dL  CBC with Differential     Status: Abnormal   Collection Time: 05/08/23  4:50 AM  Result Value Ref Range   WBC 11.3 (H) 4.0 - 10.5 K/uL   RBC 2.49 (L) 4.22 - 5.81 MIL/uL   Hemoglobin 8.1 (L) 13.0 - 17.0 g/dL   HCT 56.4 (L) 33.2 - 95.1 %   MCV 98.0 80.0 - 100.0 fL   MCH 32.5 26.0 - 34.0 pg   MCHC 33.2 30.0 - 36.0 g/dL   RDW 88.4 (H) 16.6 - 06.3 %   Platelets 188 150 - 400 K/uL   nRBC 4.3 (H) 0.0 - 0.2 %   Neutrophils Relative % 74 %   Neutro Abs 8.5 (H) 1.7 - 7.7 K/uL   Lymphocytes Relative 9 %   Lymphs Abs 1.0 0.7 - 4.0 K/uL   Monocytes Relative 11 %   Monocytes Absolute 1.2 (H) 0.1 - 1.0 K/uL   Eosinophils Relative 2 %   Eosinophils Absolute 0.2 0.0 - 0.5 K/uL   Basophils Relative 0 %   Basophils Absolute 0.1 0.0 - 0.1 K/uL   Immature Granulocytes 4 %   Abs Immature Granulocytes 0.41 (H) 0.00 - 0.07 K/uL  Magnesium     Status: None   Collection Time: 05/08/23  4:50 AM  Result Value Ref Range   Magnesium 2.0 1.7 - 2.4 mg/dL  Phosphorus     Status: None   Collection Time: 05/08/23  4:50 AM  Result Value Ref Range   Phosphorus 3.1 2.5 - 4.6 mg/dL  Lactate dehydrogenase     Status: Abnormal   Collection Time: 05/08/23  4:50 AM  Result Value Ref Range   LDH 260 (H) 98 - 192 U/L  Protime-INR     Status: Abnormal   Collection Time: 05/08/23  4:50 AM  Result Value Ref Range   Prothrombin Time 23.0 (H) 11.4 - 15.2 seconds   INR 2.0 (H) 0.8 - 1.2  APTT     Status: None   Collection Time: 05/08/23  4:50 AM  Result Value Ref Range   aPTT 34 24 - 36 seconds  Basic metabolic panel     Status:  Abnormal   Collection Time: 05/08/23  4:50 AM  Result Value Ref Range   Sodium 134 (L) 135 - 145 mmol/L   Potassium 3.5 3.5 - 5.1 mmol/L   Chloride 95 (L) 98 - 111 mmol/L   CO2 29 22 - 32 mmol/L   Glucose, Bld 127 (H) 70 - 99 mg/dL   BUN 15 6 - 20 mg/dL   Creatinine, Ser 0.16 0.61 - 1.24 mg/dL   Calcium 8.6 (L) 8.9 - 10.3 mg/dL   GFR, Estimated >01 >09 mL/min   Anion gap 10 5 - 15  Cooxemetry Panel (carboxy, met, total hgb, O2 sat)     Status: Abnormal   Collection Time: 05/08/23  4:50 AM  Result Value Ref Range   Total hemoglobin 8.4 (L) 12.0 - 16.0 g/dL   O2 Saturation 32.3 %   Carboxyhemoglobin 2.1 (H) 0.5 - 1.5 %   Methemoglobin <0.7 0.0 - 1.5 %  Glucose, capillary  Status: Abnormal   Collection Time: 05/08/23  6:27 AM  Result Value Ref Range   Glucose-Capillary 133 (H) 70 - 99 mg/dL  Glucose, capillary     Status: Abnormal   Collection Time: 05/08/23  8:41 AM  Result Value Ref Range   Glucose-Capillary 183 (H) 70 - 99 mg/dL   ECHOCARDIOGRAM LIMITED  Result Date: 05/07/2023    ECHOCARDIOGRAM LIMITED REPORT   Patient Name:   [name] Date of Exam: 05/07/2023 Medical Rec #:  [id number]              Height:       75.0 in Accession #:    [id number]             Weight:       305.6 lb Date of Birth:  1970/09/06               BSA:          2.627 m Patient Age:    52 years               BP:           95/58 mmHg Patient Gender: M                      HR:           80 bpm. Exam Location:  Inpatient Procedure: Limited Echo and Limited Color Doppler Indications:    LVAD evaluation  History:        Patient has prior history of Echocardiogram examinations, most                 recent 05/03/2023. Cardiomyopathy and CHF, CAD, Defibrillator,                 Arrythmias:RBBB; Risk Factors:Diabetes, Sleep Apnea and                 Hypertension.  Sonographer:    Milda Smart Referring Phys: [phone] PETER VANTRIGT  Sonographer Comments: Image acquisition challenging due to patient body  habitus. IMPRESSIONS  1. Limited study due to technically difficult images. HeartMate 3 LVAD at 6000 rpm.  2. The aortic valve opens with each beat. There is no meaningful pericardial effusion.  3. The left ventricle is decompressed and has severely reduced systolic function. Cannot assess regional wall motion.  4. The right ventricle is normal in size and function. A defibrillator lead is seen. FINDINGS  Additional Comments: Color Doppler performed.  LEFT VENTRICLE PLAX 2D LVIDd:         5.10 cm LVIDs:         4.50 cm LV PW:         1.00 cm LV IVS:        0.90 cm  Rachelle Hora Croitoru MD Electronically signed by Thurmon Fair MD Signature Date/Time: 05/07/2023/6:01:37 PM    Final    DG CHEST PORT 1 VIEW  Result Date: 05/06/2023 CLINICAL DATA:  PICC placement EXAM: PORTABLE CHEST 1 VIEW COMPARISON:  05/06/2023, 5:41 a.m. FINDINGS: Enlarged cardiac silhouette. Left base consolidation and effusion. No pneumothorax. Swan-Ganz catheter tip overlies main pulmonary artery. Right-sided PICC tip is in the neck. Left-sided pacer. Left ventricular assist device. IMPRESSION: Left base consolidation and effusion. Right-sided PICC tip extends into the neck and off the x-ray. Electronically Signed   By: Layla Maw M.D.   On: 05/06/2023 13:25   DG CHEST PORT 1 VIEW  Result Date: 05/06/2023 CLINICAL DATA:  Line placement. EXAM: PORTABLE CHEST 1 VIEW COMPARISON:  12:50 p.m., 05/06/2023. FINDINGS: Enlarged cardiac silhouette. Left base consolidation and effusion. Median sternotomy wires. Right-sided Swan-Ganz catheter tip overlies right pulmonary artery. Right-sided PICC tip mid SVC. Left ventricular cyst device. Left-sided pacer. IMPRESSION: Left base consolidation and effusion. Right-sided PICC tip has been repositioned to the mid SVC. Electronically Signed   By: Layla Maw M.D.   On: 05/06/2023 13:23    Assessment/Plan: Diagnosis: 52 year old male with ischemic cardiomyopathy status post LVAD placement  05/03/2023.  Patient with limitations in functional mobility and self-care due to generalized weakness. Does the need for close, 24 hr/day medical supervision in concert with the patient's rehab needs make it unreasonable for this patient to be served in a less intensive setting? Yes Co-Morbidities requiring supervision/potential complications:  -History of VT arrest -CAD -AKI -Morbid obesity/sleep apnea -Diabetes Due to bladder management, bowel management, safety, skin/wound care, disease management, medication administration, pain management, and patient education, does the patient require 24 hr/day rehab nursing? Yes Does the patient require coordinated care of a physician, rehab nurse, therapy disciplines of PT, OT to address physical and functional deficits in the context of the above medical diagnosis(es)? Yes Addressing deficits in the following areas: balance, endurance, locomotion, strength, transferring, bowel/bladder control, bathing, dressing, feeding, grooming, toileting, and psychosocial support Can the patient actively participate in an intensive therapy program of at least 3 hrs of therapy per day at least 5 days per week? Yes The potential for patient to make measurable gains while on inpatient rehab is excellent Anticipated functional outcomes upon discharge from inpatient rehab are modified independent  with PT, modified independent and supervision with OT, n/a with SLP. Estimated rehab length of stay to reach the above functional goals is: 7 days Anticipated discharge destination: Home Overall Rehab/Functional Prognosis: excellent  POST ACUTE RECOMMENDATIONS: This patient's condition is appropriate for continued rehabilitative care in the following setting: CIR Patient has agreed to participate in recommended program. Yes Note that insurance prior authorization may be required for reimbursement for recommended care.  Comment: Rehab Admissions Coordinator to follow up.          I have personally performed a face to face diagnostic evaluation of this patient. Additionally, I have examined the patient's medical record including any pertinent labs and radiographic images. If the physician assistant has documented in this note, I have reviewed and edited or otherwise concur with the physician assistant's documentation.  Thanks,  Ranelle Oyster, MD 05/08/2023

## 2023-05-08 NOTE — Progress Notes (Signed)
Advanced Heart Failure VAD Team Note  PCP-Cardiologist: [name], MD   Subjective:    11/14: s/p HM3 LVAD implantation Post op day #5  Off Vaso. Epi 6. Milrinone 0.25. Co-ox 58 Lasix transitioned to intermittent IV. CVP 12. PAC removed. Plan to remove pocket drain today per CTS.  VAD speeds increase to 6100 yesterday. Doppler 92  LVAD INTERROGATION:  HeartMate HM3 LVAD:   Flow 5.6 liters/min, Speed 6100, Power 5.1, PI 3.9,        8 PI events yesterday evening, no speed drops and PI >3.5  Objective:    Vital Signs:   Temp:  [97 F (36.1 C)-100.6 F (38.1 C)] 99.9 F (37.7 C) (11/19 0630) Pulse Rate:  [75-165] 80 (11/19 0630) Resp:  [10-23] 17 (11/19 0630) BP: (75-136)/(57-118) 110/62 (11/19 0630) SpO2:  [81 %-99 %] 94 % (11/19 0630) Weight:  [144.2 kg] 144.2 kg (11/19 0500) Last BM Date : 05/07/23  Mean arterial Pressure 80-90s  Intake/Output:   Intake/Output Summary (Last 24 hours) at 05/08/2023 0917 Last data filed at 05/08/2023 0523 Gross per 24 hour  Intake 784.46 ml  Output 2085 ml  Net -1300.54 ml   Physical Exam    CVP 12 General: Well appearing. No distress on RA HEENT: neck supple.  Cardiac: JVP 10. Mechanical heart sounds with LVAD hum present.  Resp: Lung sounds clear and equal B/L Abdomen: Soft, non-tender, non-distended. + BS. Driveline: Dressing C/D/I. No drainage or redness. Anchor in place. Extremities: Warm and dry. No rash, cyanosis. Mild peripheral edema.  Neuro: Alert and oriented x3. Affect pleasant. Moves all extremities without difficulty. Lines/Devices: RIJ introducer, RUE PICC, Foley, Pocket CT  Telemetry   AV paced 80 (personally reviewed)  Labs   Basic Metabolic Panel: Recent Labs  Lab 05/04/23 0309 05/04/23 0426 05/04/23 1700 05/05/23 0438 05/06/23 0414 05/07/23 0400 05/08/23 0450  NA 136   < > 137 132* 135 137 134*  K 4.1   < > 3.9 4.2 4.3 3.3* 3.5  CL 105  --  105 102 100 98 95*  CO2 20*  --  22 22  27 30 29   GLUCOSE 153*  --  166* 216* 198* 165* 127*  BUN 16  --  13 14 18  21* 15  CREATININE 1.42*  --  1.47* 1.01 0.99 1.14 1.04  CALCIUM 8.2*  --  8.4* 8.1* 8.8* 8.1* 8.6*  MG  --   --  2.3 2.3 2.2 1.9 2.0  PHOS 3.7  --   --  2.7 2.5 3.2 3.1   < > = values in this interval not displayed.    Liver Function Tests: Recent Labs  Lab 05/04/23 0309 05/05/23 0438 05/06/23 0414  AST 63* 43* 34  ALT 17 16 17   ALKPHOS 45 45 53  BILITOT 2.1* 2.4* 2.2*  PROT 5.8* 5.7* 5.9*  ALBUMIN 3.4* 3.1* 3.0*   No results for input(s): "LIPASE", "AMYLASE" in the last 168 hours. No results for input(s): "AMMONIA" in the last 168 hours.  CBC: Recent Labs  Lab 05/04/23 0309 05/04/23 0426 05/04/23 1700 05/05/23 0438 05/06/23 0414 05/07/23 0400 05/08/23 0450  WBC 16.0*  --  17.4* 18.5* 16.3* 13.2* 11.3*  NEUTROABS 13.1*  --   --  15.1* 13.6* 10.2* 8.5*  HGB 9.9*   < > 8.5* 8.0* 8.3* 8.0* 8.1*  HCT 28.6*   < > 25.5* 24.9* 25.8* 24.3* 24.4*  MCV 94.1  --  94.8 99.2 97.0 98.0 98.0  PLT 163  --  150 132* 149* 160 188   < > = values in this interval not displayed.    INR: Recent Labs  Lab 05/04/23 0309 05/05/23 0438 05/06/23 0414 05/07/23 0400 05/08/23 0450  INR 1.3* 1.6* 1.4* 1.4* 2.0*    Other results:   Imaging   ECHOCARDIOGRAM LIMITED  Result Date: 05/07/2023    ECHOCARDIOGRAM LIMITED REPORT   Patient Name:   Joe [name] Date of Exam: 05/07/2023 Medical Rec #:  [id number]              Height:       75.0 in Accession #:    [id number]             Weight:       305.6 lb Date of Birth:  [birth year]               BSA:          2.627 m Patient Age:    52 years               BP:           95/58 mmHg Patient Gender: M                      HR:           80 bpm. Exam Location:  Inpatient Procedure: Limited Echo and Limited Color Doppler Indications:    LVAD evaluation  History:        Patient has prior history of Echocardiogram examinations, most                 recent 05/03/2023.  Cardiomyopathy and CHF, CAD, Defibrillator,                 Arrythmias:RBBB; Risk Factors:Diabetes, Sleep Apnea and                 Hypertension.  Sonographer:    Milda Smart Referring Phys: [phone] PETER VANTRIGT  Sonographer Comments: Image acquisition challenging due to patient body habitus. IMPRESSIONS  1. Limited study due to technically difficult images. HeartMate 3 LVAD at 6000 rpm.  2. The aortic valve opens with each beat. There is no meaningful pericardial effusion.  3. The left ventricle is decompressed and has severely reduced systolic function. Cannot assess regional wall motion.  4. The right ventricle is normal in size and function. A defibrillator lead is seen. FINDINGS  Additional Comments: Color Doppler performed.  LEFT VENTRICLE PLAX 2D LVIDd:         5.10 cm LVIDs:         4.50 cm LV PW:         1.00 cm LV IVS:        0.90 cm  Rachelle Hora Croitoru MD Electronically signed by Thurmon Fair MD Signature Date/Time: 05/07/2023/6:01:37 PM    Final    DG CHEST PORT 1 VIEW  Result Date: 05/06/2023 CLINICAL DATA:  PICC placement EXAM: PORTABLE CHEST 1 VIEW COMPARISON:  05/06/2023, 5:41 a.m. FINDINGS: Enlarged cardiac silhouette. Left base consolidation and effusion. No pneumothorax. Swan-Ganz catheter tip overlies main pulmonary artery. Right-sided PICC tip is in the neck. Left-sided pacer. Left ventricular assist device. IMPRESSION: Left base consolidation and effusion. Right-sided PICC tip extends into the neck and off the x-ray. Electronically Signed   By: Layla Maw M.D.   On: 05/06/2023 13:25   DG CHEST PORT 1 VIEW  Result Date: 05/06/2023 CLINICAL DATA:  Line placement. EXAM: PORTABLE CHEST  1 VIEW COMPARISON:  12:50 p.m., 05/06/2023. FINDINGS: Enlarged cardiac silhouette. Left base consolidation and effusion. Median sternotomy wires. Right-sided Swan-Ganz catheter tip overlies right pulmonary artery. Right-sided PICC tip mid SVC. Left ventricular cyst device. Left-sided pacer.  IMPRESSION: Left base consolidation and effusion. Right-sided PICC tip has been repositioned to the mid SVC. Electronically Signed   By: Layla Maw M.D.   On: 05/06/2023 13:23    Medications:    Scheduled Medications:  acetaminophen  1,000 mg Oral Q6H   Or   acetaminophen (TYLENOL) oral liquid 160 mg/5 mL  1,000 mg Per Tube Q6H   allopurinol  100 mg Oral Daily   amiodarone  200 mg Oral BID   aspirin EC  325 mg Oral Daily   Or   aspirin  324 mg Per Tube Daily   Or   aspirin  300 mg Rectal Daily   bisacodyl  10 mg Oral Daily   Or   bisacodyl  10 mg Rectal Daily   Chlorhexidine Gluconate Cloth  6 each Topical Daily   escitalopram  10 mg Oral QHS   ezetimibe  10 mg Oral Daily   Fe Fum-Vit C-Vit B12-FA  1 capsule Oral QPC breakfast   feeding supplement  237 mL Oral BID BM   furosemide  40 mg Intravenous BID   gabapentin  400 mg Oral TID   insulin aspart  0-24 Units Subcutaneous TID WC & HS   insulin aspart  6 Units Subcutaneous TID WC   insulin detemir  38 Units Subcutaneous BID   levothyroxine  25 mcg Oral Daily   metoCLOPramide (REGLAN) injection  10 mg Intravenous Q6H   metolazone  5 mg Oral Once   mexiletine  250 mg Oral Q12H   potassium chloride  20 mEq Oral Q4H   rosuvastatin  40 mg Oral Daily   sodium chloride flush  10-40 mL Intracatheter Q12H   sodium chloride flush  3 mL Intravenous Q12H   Warfarin - Pharmacist Dosing Inpatient   Does not apply q1600    Infusions:  epinephrine 7 mcg/min (05/08/23 0519)   milrinone 0.25 mcg/kg/min (05/08/23 0400)    PRN Medications: acetaminophen, dextrose, ondansetron (ZOFRAN) IV, mouth rinse, oxyCODONE, polyvinyl alcohol, sodium chloride flush, traMADol  Patient Profile   Carnie Crispino is a 52 y.o. male with h/o morbid obesity, DM2, HTN, HL, OSA, CAD and systolic HF.  Now s/p HM3 LVAD Insertion 11/14 by Dt. Vantrigt.  Assessment/Plan:    1.  Chronic Systolic HF s/p HM3 Insertion 91/47 - Etiology: iCM - Echo (8/24):  EF 20-25%, RV ok. - Echo 05/01/23: EF 20%, RV moderately down (improved) - R/LHC (9/24): stable 3v CAD, elevated biventricular filling pressures with low PAPi; CI 2.2 - RHC (11/24): RA 9, PA mean 28, PCWP 19, Fick CI 1.9, TD CI 2.2, PVR 1.6, PAPi 2.4 - S/p HM III LVAD 05/03/23 by Dr. Maren Beach - Received FFP x6, Cryo x2, PLT x1, and DDAVP in OR - Intra-op TEE - EF 20-25%, RV moderately reduced, IV septum with mild rightward bowing, AV opens intermittently - Off vaso, Epi 6,  milrinone 025. Co-ox 58%. Wean Epi as able. - CVP 12. Lasix gtt transitioned to Lasix 40 IV BID. Assess UOP.  - VAD interrogated personally. Parameters stable.  - Limited Echo yesterday with limited images. LV decompressed, ?RV normal. Speed increased to 6100.    2. H/o VT Arrest  Hx PVCs - Followed by Dr. Elberta Fortis - Admitted 10/10-10/13/24 w/ VF treated w/ ICD  shock>>loaded w/ IV amio  - Re-admitted 04/08/23 w/ VT arrest, treated w/ appropriate ICD shock x 2 + brief bystander CPR  - in setting of underling CM w/ EF < 35% + intermittent hypokalemia  - Continue Amio 200 mg bid and Mexiletine 250 mg bid   3. CAD with chronic stable angina - s/p multiple stents to LAD and LCX - Cath 9/22 stable CAD - LHC (9/24) with stable 3v CAD - Continue ASA + Crestor 40mg  daily  4. AKI  - Now at baseline (sCr 1.01) - Keep off SGLT2i  - Avoid hypotension - Follow BMET   5. DM2 - A1c 7.2 (1/24) - SSI   6. OSA - Not using CPAP, says machine was recalled a while ago. Needs new machine. - Followed outpatient   7. Obesity - Body mass index is 37.62 kg/m.  Length of Stay: 8  [place] Alecea Trego, NP 05/08/2023, 9:17 AM  VAD Team --- VAD ISSUES ONLY--- Pager [phone] (7am - 7am)  Advanced Heart Failure Team  Pager [phone] (M-F; 7a - 5p)  Please contact [facility] for night-coverage after hours (5p -7a ) and weekends on [email or web address]

## 2023-05-08 NOTE — Plan of Care (Signed)
  Problem: Cardiovascular: Goal: Ability to achieve and maintain adequate cardiovascular perfusion will improve Outcome: Progressing Goal: Vascular access site(s) Level 0-1 will be maintained Outcome: Progressing   Problem: Clinical Measurements: Goal: Ability to maintain clinical measurements within normal limits will improve Outcome: Progressing Goal: Will remain free from infection Outcome: Progressing Goal: Diagnostic test results will improve Outcome: Progressing Goal: Respiratory complications will improve Outcome: Progressing Goal: Cardiovascular complication will be avoided Outcome: Progressing   Problem: Activity: Goal: Risk for activity intolerance will decrease Outcome: Progressing   Problem: Coping: Goal: Level of anxiety will decrease Outcome: Progressing   Problem: Elimination: Goal: Will not experience complications related to bowel motility Outcome: Progressing Goal: Will not experience complications related to urinary retention Outcome: Progressing   Problem: Pain Management: Goal: General experience of comfort will improve Outcome: Progressing   Problem: Safety: Goal: Ability to remain free from injury will improve Outcome: Progressing   Problem: Skin Integrity: Goal: Risk for impaired skin integrity will decrease Outcome: Progressing

## 2023-05-08 NOTE — Progress Notes (Signed)
PHARMACY - ANTICOAGULATION CONSULT NOTE  Pharmacy Consult for warfarin Indication: LVAD HM3 implanted 11/14  No Known Allergies  Patient Measurements: Height: 6\' 3"  (190.5 cm) Weight: (!) 144.2 kg (317 lb 14.5 oz) IBW/kg (Calculated) : 84.5   Vital Signs: Temp: 99.9 F (37.7 C) (11/19 0630) Temp Source: Bladder (11/19 0400) BP: 110/62 (11/19 0630) Pulse Rate: 80 (11/19 0630)  Labs: Recent Labs    05/06/23 0414 05/07/23 0400 05/08/23 0450  HGB 8.3* 8.0* 8.1*  HCT 25.8* 24.3* 24.4*  PLT 149* 160 188  APTT 38* 32 34  LABPROT 17.3* 17.5* 23.0*  INR 1.4* 1.4* 2.0*  CREATININE 0.99 1.14 1.04    Estimated Creatinine Clearance: 127.4 mL/min (by C-G formula based on SCr of 1.04 mg/dL).   Medical History: Past Medical History:  Diagnosis Date   Abnormal EKG    Acute systolic heart failure (HCC) 09/06/2017   Angina pectoris (HCC) 09/05/2017   Body mass index 45.0-49.9, adult (HCC)    CAD S/P percutaneous coronary angioplasty 09/07/2017   mLAD and dLAD PCI with DES 09/06/17   CHF (congestive heart failure) (HCC)    Chronic systolic (congestive) heart failure (HCC) 03/19/2019   Complication of anesthesia 1995   "had hard time waking up; breathing w/vasectomy"   Coronary artery disease    Erectile dysfunction    Essential hypertension, benign    Fatigue    GERD (gastroesophageal reflux disease)    Gout    High cholesterol    "just started tx yesterday" (09/06/2017)   History of gout    "RX prn" (09/06/2017)   Ischemic cardiomyopathy 09/07/2017   EF 25-35%   Muscular chest pain    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   Below Left Nare (MOH's)   Obesity    Obstructive sleep apnea    OSA on CPAP    Plantar fasciitis    Pre-operative clearance 02/11/2018   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/02/2019   Saint Jude   Right bundle branch block    Shortness of breath 09/05/2017   Testosterone deficiency    Type 2 diabetes mellitus  with complication, without long-term current use of insulin (HCC) 09/05/2017   Type II diabetes mellitus (HCC)    "started tx 08/28/2017"      Assessment: 52yom with heart failure s/p LVAD implant HM3 11/14. INR 1.4, Tbili 2.2, no bleeding chest tubes removed except pocket drain.  INR slightly elevated at baseline (1.6). INR 2.0 is within goal today after warfarin 5 mg x2 days, Hgb (8.1) and Plts (260) are stable. Patient eating >100% of meals per chart. No new DDIs noted.   Goal of Therapy:  INR 2-2.5 Monitor platelets by anticoagulation protocol: Yes   Plan:  Warfarin 2.5 mg x1 today Daily PT/INR. Monitor s/s bleeding    Ernestene Kiel, PharmD PGY1 Pharmacy Resident  Please check AMION for all [facility] Pharmacy phone numbers After 10:00 PM, call Main Pharmacy [phone]  05/08/2023 10:06 AM

## 2023-05-09 ENCOUNTER — Inpatient Hospital Stay (HOSPITAL_COMMUNITY): Payer: 59

## 2023-05-09 ENCOUNTER — Encounter (HOSPITAL_COMMUNITY): Payer: 59

## 2023-05-09 DIAGNOSIS — I5022 Chronic systolic (congestive) heart failure: Secondary | ICD-10-CM | POA: Diagnosis not present

## 2023-05-09 DIAGNOSIS — Z515 Encounter for palliative care: Secondary | ICD-10-CM | POA: Diagnosis not present

## 2023-05-09 DIAGNOSIS — I509 Heart failure, unspecified: Secondary | ICD-10-CM | POA: Diagnosis not present

## 2023-05-09 DIAGNOSIS — I255 Ischemic cardiomyopathy: Secondary | ICD-10-CM | POA: Diagnosis not present

## 2023-05-09 LAB — CBC WITH DIFFERENTIAL/PLATELET
Abs Immature Granulocytes: 0.19 10*3/uL — ABNORMAL HIGH (ref 0.00–0.07)
Basophils Absolute: 0.1 10*3/uL (ref 0.0–0.1)
Basophils Relative: 0 %
Eosinophils Absolute: 0.1 10*3/uL (ref 0.0–0.5)
Eosinophils Relative: 1 %
HCT: 24.3 % — ABNORMAL LOW (ref 39.0–52.0)
Hemoglobin: 8 g/dL — ABNORMAL LOW (ref 13.0–17.0)
Immature Granulocytes: 1 %
Lymphocytes Relative: 6 %
Lymphs Abs: 0.8 10*3/uL (ref 0.7–4.0)
MCH: 32 pg (ref 26.0–34.0)
MCHC: 32.9 g/dL (ref 30.0–36.0)
MCV: 97.2 fL (ref 80.0–100.0)
Monocytes Absolute: 1.3 10*3/uL — ABNORMAL HIGH (ref 0.1–1.0)
Monocytes Relative: 10 %
Neutro Abs: 10.7 10*3/uL — ABNORMAL HIGH (ref 1.7–7.7)
Neutrophils Relative %: 82 %
Platelets: 183 10*3/uL (ref 150–400)
RBC: 2.5 MIL/uL — ABNORMAL LOW (ref 4.22–5.81)
RDW: 17.4 % — ABNORMAL HIGH (ref 11.5–15.5)
WBC: 13.2 10*3/uL — ABNORMAL HIGH (ref 4.0–10.5)
nRBC: 1.9 % — ABNORMAL HIGH (ref 0.0–0.2)

## 2023-05-09 LAB — COOXEMETRY PANEL
Carboxyhemoglobin: 1.7 % — ABNORMAL HIGH (ref 0.5–1.5)
Carboxyhemoglobin: 2 % — ABNORMAL HIGH (ref 0.5–1.5)
Methemoglobin: 0.8 % (ref 0.0–1.5)
Methemoglobin: <0.7 % (ref 0.0–1.5)
O2 Saturation: 60.8 %
O2 Saturation: 65.1 %
Total hemoglobin: 8.5 g/dL — ABNORMAL LOW (ref 12.0–16.0)
Total hemoglobin: 8.8 g/dL — ABNORMAL LOW (ref 12.0–16.0)

## 2023-05-09 LAB — BASIC METABOLIC PANEL
Anion gap: 8 (ref 5–15)
Anion gap: 10 (ref 5–15)
BUN: 11 mg/dL (ref 6–20)
BUN: 9 mg/dL (ref 6–20)
CO2: 33 mmol/L — ABNORMAL HIGH (ref 22–32)
CO2: 33 mmol/L — ABNORMAL HIGH (ref 22–32)
Calcium: 8.3 mg/dL — ABNORMAL LOW (ref 8.9–10.3)
Calcium: 8.1 mg/dL — ABNORMAL LOW (ref 8.9–10.3)
Chloride: 93 mmol/L — ABNORMAL LOW (ref 98–111)
Chloride: 92 mmol/L — ABNORMAL LOW (ref 98–111)
Creatinine, Ser: 1.05 mg/dL (ref 0.61–1.24)
Creatinine, Ser: 0.88 mg/dL (ref 0.61–1.24)
GFR, Estimated: >60 mL/min (ref >60)
GFR, Estimated: >60 mL/min (ref >60)
Glucose, Bld: 120 mg/dL — ABNORMAL HIGH (ref 70–99)
Glucose, Bld: 114 mg/dL — ABNORMAL HIGH (ref 70–99)
Potassium: 2.9 mmol/L — ABNORMAL LOW (ref 3.5–5.1)
Potassium: 3.1 mmol/L — ABNORMAL LOW (ref 3.5–5.1)
Sodium: 134 mmol/L — ABNORMAL LOW (ref 135–145)
Sodium: 135 mmol/L (ref 135–145)

## 2023-05-09 LAB — LACTATE DEHYDROGENASE: LDH: 247 U/L — ABNORMAL HIGH (ref 98–192)

## 2023-05-09 LAB — GLUCOSE, CAPILLARY
Glucose-Capillary: 92 mg/dL (ref 70–99)
Glucose-Capillary: 100 mg/dL — ABNORMAL HIGH (ref 70–99)
Glucose-Capillary: 68 mg/dL — ABNORMAL LOW (ref 70–99)
Glucose-Capillary: 104 mg/dL — ABNORMAL HIGH (ref 70–99)
Glucose-Capillary: 112 mg/dL — ABNORMAL HIGH (ref 70–99)

## 2023-05-09 LAB — APTT: aPTT: 42 s — ABNORMAL HIGH (ref 24–36)

## 2023-05-09 LAB — PROTIME-INR
INR: 2.6 — ABNORMAL HIGH (ref 0.8–1.2)
Prothrombin Time: 28.4 s — ABNORMAL HIGH (ref 11.4–15.2)

## 2023-05-09 LAB — MAGNESIUM: Magnesium: 1.9 mg/dL (ref 1.7–2.4)

## 2023-05-09 LAB — PHOSPHORUS: Phosphorus: 3.7 mg/dL (ref 2.5–4.6)

## 2023-05-09 MED ORDER — SPIRONOLACTONE 12.5 MG HALF TABLET
12.5000 mg | ORAL_TABLET | Freq: Every day | ORAL | Status: DC
  Administered 2023-05-09 – 2023-05-14 (×6): 12.5 mg via ORAL
  Filled 2023-05-09 (×6): qty 1

## 2023-05-09 MED ORDER — BIOTENE DRY MOUTH MT LIQD: 15.0000 mL | OROMUCOSAL | Status: DC | PRN

## 2023-05-09 MED ORDER — ASPIRIN 81 MG PO TBEC
81.0000 mg | DELAYED_RELEASE_TABLET | Freq: Every day | ORAL | Status: DC
  Administered 2023-05-09 – 2023-05-21 (×13): 81 mg via ORAL
  Filled 2023-05-09 (×13): qty 1

## 2023-05-09 MED ORDER — POTASSIUM CHLORIDE CRYS ER 20 MEQ PO TBCR
20.0000 meq | EXTENDED_RELEASE_TABLET | ORAL | Status: DC
  Administered 2023-05-09: 20 meq via ORAL
  Filled 2023-05-09: qty 1

## 2023-05-09 MED ORDER — POTASSIUM CHLORIDE CRYS ER 20 MEQ PO TBCR
40.0000 meq | EXTENDED_RELEASE_TABLET | ORAL | Status: AC
  Administered 2023-05-09 (×3): 40 meq via ORAL
  Filled 2023-05-09 (×3): qty 2

## 2023-05-09 MED ORDER — MAGNESIUM SULFATE 2 GM/50ML IV SOLN
2.0000 g | Freq: Once | INTRAVENOUS | Status: AC
  Administered 2023-05-09: 2 g via INTRAVENOUS
  Filled 2023-05-09: qty 50

## 2023-05-09 MED ORDER — POTASSIUM CHLORIDE CRYS ER 20 MEQ PO TBCR: 40.0000 meq | EXTENDED_RELEASE_TABLET | ORAL | Status: DC

## 2023-05-09 MED ORDER — INSULIN DETEMIR 100 UNIT/ML ~~LOC~~ SOLN
20.0000 [IU] | Freq: Two times a day (BID) | SUBCUTANEOUS | Status: DC
  Administered 2023-05-09 – 2023-05-21 (×24): 20 [IU] via SUBCUTANEOUS
  Filled 2023-05-09 (×25): qty 0.2

## 2023-05-09 MED ORDER — MILRINONE LACTATE IN DEXTROSE 20-5 MG/100ML-% IV SOLN
0.1250 ug/kg/min | INTRAVENOUS | Status: DC
  Administered 2023-05-09: 0.125 ug/kg/min via INTRAVENOUS
  Filled 2023-05-09: qty 100

## 2023-05-09 NOTE — Progress Notes (Signed)
HeartMate 3 Rounding Note Status post implantation of HeartMate 3 May 03, 2023  Subjective:   Postop day 6 HeartMate 3 implant Continues to progress, weaning Mil, epi Coox this am 60% VAD parameters have been stable Speed increased to 6200 RPM with VAD flows 5.6-5.8 L  INR now 2.6 after third dose coumadin Good urine output after twice daily Lasix dosing 40 mg IV.  Foley catheter has been removed. R internal jugular sheath is out , PICC for cvp, coox   Exit site of the driveline is clean and dry. LVAD INTERROGATION:  HeartMate II LVAD:  Flow 5.6 liters/min, speed 6200, power 4.2, PI 3.1.  Controller intact.   Objective:    Vital Signs:   Temp:  [97.7 F (36.5 C)-98.4 F (36.9 C)] 98.1 F (36.7 C) (11/20 1114) Pulse Rate:  [67-182] 83 (11/20 1100) Resp:  [0-34] 11 (11/20 1100) BP: (77-132)/(45-110) 103/45 (11/20 1015) SpO2:  [74 %-100 %] 93 % (11/20 1100) Weight:  [143.2 kg] 143.2 kg (11/20 0500) Last BM Date : 05/09/23 Mean arterial Pressure 80-85  Intake/Output:   Intake/Output Summary (Last 24 hours) at 05/09/2023 1208 Last data filed at 05/09/2023 1141 Gross per 24 hour  Intake 2931.77 ml  Output 5375 ml  Net -2443.23 ml     Physical Exam: General:  Well appearing. No resp difficulty    Sternal incision dry, clean. VAD tunnel dressing dry HEENT: normal Neck: supple. JVP . Carotids no bruits. No lymphadenopathy or thryomegaly appreciated. Cor: Mechanical heart sounds with LVAD hum present. Lungs: clear Abdomen: soft, nontender, nondistended. No hepatosplenomegaly. No bruits or masses. Good bowel sounds. Extremities: no cyanosis, clubbing, rash, edema Neuro: alert & orientedx3, cranial nerves grossly intact. moves all 4 extremities w/o difficulty. Affect pleasant  Telemetry: A-V sequentially paced at 80 bpm  Labs: Basic Metabolic Panel: Recent Labs  Lab 05/05/23 0438 05/06/23 0414 05/07/23 0400 05/08/23 0450 05/09/23 0414  NA 132* 135 137 134*  134*  K 4.2 4.3 3.3* 3.5 2.9*  CL 102 100 98 95* 93*  CO2 22 27 30 29  33*  GLUCOSE 216* 198* 165* 127* 120*  BUN 14 18 21* 15 11  CREATININE 1.01 0.99 1.14 1.04 1.05  CALCIUM 8.1* 8.8* 8.1* 8.6* 8.3*  MG 2.3 2.2 1.9 2.0 1.9  PHOS 2.7 2.5 3.2 3.1 3.7    Liver Function Tests: Recent Labs  Lab 05/04/23 0309 05/05/23 0438 05/06/23 0414 05/08/23 0450  AST 63* 43* 34 44*  ALT 17 16 17 21   ALKPHOS 45 45 53 81  BILITOT 2.1* 2.4* 2.2* 1.3*  PROT 5.8* 5.7* 5.9* 5.8*  ALBUMIN 3.4* 3.1* 3.0* 2.7*   No results for input(s): "LIPASE", "AMYLASE" in the last 168 hours. No results for input(s): "AMMONIA" in the last 168 hours.  CBC: Recent Labs  Lab 05/05/23 0438 05/06/23 0414 05/07/23 0400 05/08/23 0450 05/09/23 0414  WBC 18.5* 16.3* 13.2* 11.3* 13.2*  NEUTROABS 15.1* 13.6* 10.2* 8.5* 10.7*  HGB 8.0* 8.3* 8.0* 8.1* 8.0*  HCT 24.9* 25.8* 24.3* 24.4* 24.3*  MCV 99.2 97.0 98.0 98.0 97.2  PLT 132* 149* 160 188 183    INR: Recent Labs  Lab 05/05/23 0438 05/06/23 0414 05/07/23 0400 05/08/23 0450 05/09/23 0414  INR 1.6* 1.4* 1.4* 2.0* 2.6*    Other results: EKG:   Imaging: DG Chest Port 1 View  Result Date: 05/09/2023 CLINICAL DATA:  Left ventricular assist device. EXAM: PORTABLE CHEST 1 VIEW COMPARISON:  May 08, 2023. FINDINGS: Stable cardiomegaly. Left-sided defibrillator is unchanged.  Left ventricular assist device is again noted. Right-sided PICC line is again noted. Left basilar atelectasis is noted with possible small pleural effusion. IMPRESSION: Stable support apparatus. Left basilar atelectasis and probable small pleural effusion. Electronically Signed   By: Lupita Raider M.D.   On: 05/09/2023 11:58   DG Chest Port 1 View  Result Date: 05/08/2023 CLINICAL DATA:  Left ventricular assist device. EXAM: PORTABLE CHEST 1 VIEW COMPARISON:  May 06, 2023. FINDINGS: Stable cardiomegaly. Left ventricular assist device is unchanged. Left-sided pacemaker is  unchanged. Swan-Ganz catheter has been removed. Right-sided PICC line is unchanged. Left basilar atelectasis is noted with probable small pleural effusion. IMPRESSION: Left ventricular assist device is again noted as well as left-sided pacemaker. Stable left basilar atelectasis with probable small pleural effusion. Electronically Signed   By: Lupita Raider M.D.   On: 05/08/2023 11:26     Medications:     Scheduled Medications:  allopurinol  100 mg Oral Daily   amiodarone  200 mg Oral BID   aspirin EC  81 mg Oral Daily   bisacodyl  10 mg Oral Daily   Or   bisacodyl  10 mg Rectal Daily   Chlorhexidine Gluconate Cloth  6 each Topical Daily   escitalopram  10 mg Oral QHS   ezetimibe  10 mg Oral Daily   Fe Fum-Vit C-Vit B12-FA  1 capsule Oral QPC breakfast   furosemide  40 mg Intravenous BID   gabapentin  400 mg Oral TID   insulin aspart  0-24 Units Subcutaneous TID WC & HS   insulin aspart  6 Units Subcutaneous TID WC   insulin detemir  38 Units Subcutaneous BID   levothyroxine  25 mcg Oral Daily   metoCLOPramide (REGLAN) injection  10 mg Intravenous Q6H   mexiletine  250 mg Oral Q12H   potassium chloride  40 mEq Oral Q2H   rosuvastatin  40 mg Oral Daily   sodium chloride flush  10-40 mL Intracatheter Q12H   sodium chloride flush  3 mL Intravenous Q12H   spironolactone  12.5 mg Oral Daily   Warfarin - Pharmacist Dosing Inpatient   Does not apply q1600    Infusions:  epinephrine 4 mcg/min (05/09/23 1100)   milrinone 0.125 mcg/kg/min (05/09/23 1100)    PRN Medications: acetaminophen, antiseptic oral rinse, dextrose, ondansetron (ZOFRAN) IV, mouth rinse, oxyCODONE, polyvinyl alcohol, sodium chloride flush, traMADol   Assessment:   Morbidly obese 52 year old male with type 2 diabetes, sleep apnea and ischemic cardiomyopathy with EF of 10%.  Pre and postoperative RV function appear to be adequate.  Patient was extubated postop day 1 and has been mobilized out of bed to the  chair with PA catheter removed on postop day 4 following Limited echo showing adequate RV function.  Inotropes weaned to low-dose epinephrine and milrinone postop day 5. Being followed by CIR POD 6 Plan/Discussion:    Continue to wean inotropes slowly Possible tx to 2 C tomorrow Hold coumadin today    I reviewed the LVAD parameters from today, and compared the results to the patient's prior recorded data.  No programming changes were made.  The LVAD is functioning within specified parameters.  The patient performs LVAD self-test daily.  LVAD interrogation was negative for any significant power changes, alarms or PI events/speed drops.  LVAD equipment check completed and is in good working order.  Back-up equipment present.   LVAD education done on emergency procedures and precautions and reviewed exit site care.  Length of  Stay: [address]  Lovett Sox 05/09/2023, 12:08 PM

## 2023-05-09 NOTE — Progress Notes (Signed)
Nutrition Follow-up  DOCUMENTATION CODES:   Not applicable  INTERVENTION:   Consider liberalizing diet if po intake inadequate on follow-up Allow double protein/entree on meal trays  Goal is for pt to drink 2 Premier Protein per day; total of 60 g of protein. Pt usually drinks 1 for breakfast, adding the additional one as PM snack. Pt is bringing Primer Protein in from home  If po intake not adequate on follow-up, recommend switching to Ensure or Boost supplement that is both high in protein and calories. Premier Protein is high in protein but low in calories.   Encouraged pt to focus on protein at meal times; eating the protein first, especially if pt does not believe he will be able to eat majority of meal tray  Recommend changing dulcolax to prn; consider holding reglan given type 7 stools   NUTRITION DIAGNOSIS:   Increased nutrient needs related to chronic illness as evidenced by estimated needs.  Being addressed via diet advancement and supplements  GOAL:   Patient will meet greater than or equal to 90% of their needs Progressing  MONITOR:   PO intake, Supplement acceptance, Labs, Weight trends, I & O's  REASON FOR ASSESSMENT:   Consult LVAD Eval  ASSESSMENT:   52 year old male who was admitted on 11/11 for surgical optimization for LVAD HM3 implant. PMH of T2DM, HTN, HLD, OSA, CAD, systolic HF (EF 20-25%).  11/14 HM3 LVAD  placed 11/15 Extubated  Epinephrine at 4, milrinone at 0.125  Pt denies any nausea or abdominal issues. Pt on CL/FL for several days post VAD but now on Heart Healthy Diet. Pt reports appetite is good; tolerated liquids well.   Pt denies any nausea or GI issues. Pt reports pain is well controlled  Noted Ensure order discontinued; discussed with pt who indicates he brought in Primier protein from home and prefers to drink this. Pt reports he drinks these at home; usually daily at Breakfast. RD ok with pt utilizing Preimier Protein but RD  discussed goal of at least 2 Premier Protein per day currently. Pt agreeable to trying this  Pt currently with frequent stooling; pt reports at baseline he struggles with constipation and has to take medicine to have BM (usually BM 1x per week). Dulcolax held by RN this AM  Pt has been ambulating; reports he feels good overall except reports abdominal muscles are sore. Pt attributes this to working them more than normal, using them to stand, etc  Current wt 143.2 kg; admit wt 139 kg. Lowest wt this admission 136.5 kg.   Excellent UOP with diuresis; 5.6 L in 24 hours, 2L thus far today. Net negative this admission  Labs: potassium 2.9 (L) Med: lasix, KCl, ss novolog, Trigels F Forte (Contains iron, vit c, B12, folic acid), reglan, dulcolax daily    Diet Order:   Diet Order             Diet Heart Room service appropriate? Yes; Fluid consistency: Thin  Diet effective now                   EDUCATION NEEDS:   Education needs have been addressed  Skin:  Skin Assessment: Reviewed RN Assessment  Last BM:  04/29/23  Height:   Ht Readings from Last 1 Encounters:  05/03/23 6\' 3"  (1.905 m)    Weight:   Wt Readings from Last 1 Encounters:  05/09/23 (!) 143.2 kg    Ideal Body Weight:  89.1 kg  BMI:  Body mass index  is 39.46 kg/m.  Estimated Nutritional Needs:   Kcal:  2400-2600  Protein:  120-140 grams  Fluid:  2.0 L    Romelle Starcher MS, RDN, LDN, CNSC Registered Dietitian 3 Clinical Nutrition RD Pager and On-Call Pager Number Located in [place]

## 2023-05-09 NOTE — Progress Notes (Signed)
This chaplain responded to PMT referral for spiritual care. The Pt. is awake and visiting with wife-Lenore and friend-John at the time of the visit.   The Pt. is hopefully optimistic about transitioning to CIR and leaning on the medical team, family, and his friends for support of the unknown.  The chaplain will continue F/U spiritual care as needed.  Chaplain Stephanie Acre [phone]

## 2023-05-09 NOTE — Progress Notes (Signed)
   Palliative Medicine Inpatient Follow Up Note HPI: Joe [name] is a 52 y.o. male with medical history significant for chronic systolic heart failure in setting of ischemic cardiomyopathy, V-fib arrest, hyperlipidemia, obstructive sleep apnea on home nocturnal CPAP, type 2 diabetes mellitus. The Palliative medicine team has been asked to assist in VAD evaluation.   Today's Discussion 05/09/2023  *Please note that this is a verbal dictation therefore any spelling or grammatical errors are due to the "Dragon Medical One" system interpretation.  Chart reviewed inclusive of vital signs, progress notes, laboratory results, and diagnostic images.   I met with Joe [name] this morning. He shares that he walked twice yesterday and did multiple sit/stands. He expresses that he has been encouraged but what he has been told in terms of the progress he is making.  From a symptom perspective, Joe [name] remains to have pain at the LVAD insertion site. He shares that he has some abdominal pain "in his muscles" though otherwise he feels well. He has received tylenol when uncomfortable which does offer relief.  We reviewed the plan for CIR once medically optimized.  Palliative Support Provided.   Objective Assessment: Vital Signs Vitals:   05/09/23 0700 05/09/23 1114  BP:    Pulse:    Resp:    Temp: 97.9 F (36.6 C) 98.1 F (36.7 C)  SpO2:      Intake/Output Summary (Last 24 hours) at 05/09/2023 1136 Last data filed at 05/09/2023 1100 Gross per 24 hour  Intake 3171.77 ml  Output 4715 ml  Net -1543.23 ml   Last Weight  Most recent update: 05/09/2023  6:40 AM    Weight  143.2 kg (315 lb 11.2 oz)                SUMMARY OF RECOMMENDATIONS   Full Code / Full Scope of Care   Likely CIR admission once optimized   PMT will continue to follow along incrementally  Time Spent: 25 ______________________________________________________________________________________ Lamarr Lulas [place] Palliative Medicine Team Team Cell Phone: [phone] Please utilize secure chat with additional questions, if there is no response within 30 minutes please call the above phone number  Palliative Medicine Team providers are available by phone from 7am to 7pm daily and can be reached through the team cell phone.  Should this patient require assistance outside of these hours, please call the patient's attending physician.

## 2023-05-09 NOTE — Progress Notes (Signed)
Physical Therapy Treatment Patient Details Name: Joe [name] MRN: [id number] DOB: Sep 13, 1970 Today's Date: 05/09/2023   History of Present Illness 52 yo male admitted 04/30/23 for optimization with Heartmate III LVAD placed 11/14.  PMH: morbid obesity, DM2, HTN, HLD, OSA, CAD and systolic HF (EF 20-25%)    PT Comments  Pt making excellent progress with mobility. Able to manage transition from power module to batteries to power module independently. Continues to progress with mobility. Has a access to upright rollator at home so we used one of ours today. It doesn't go high enough for pt to place forearms on pads without trunk flexion so pt placed hands on forearm pads. This allowed pt to stand fully upright and have a little stability. If pt continues to progress rapidly we may need to updated dc recommendations.     If plan is discharge home, recommend the following: A little help with walking and/or transfers;A little help with bathing/dressing/bathroom;Assist for transportation;Assistance with cooking/housework   Can travel by private vehicle        Equipment Recommendations  None recommended by PT (pt has upright rollator at home)    Recommendations for Other Services       Precautions / Restrictions Precautions Precautions: Sternal;Other (comment) Precaution Comments: LVAD Restrictions Other Position/Activity Restrictions: sternal precautions     Mobility  Bed Mobility Overal bed mobility: Needs Assistance Bed Mobility: Rolling, Sidelying to Sit Rolling: Contact guard assist Sidelying to sit: Min assist, HOB elevated       General bed mobility comments: Assist to elevate trunk into sitting    Transfers Overall transfer level: Needs assistance Equipment used: Rollator (4 wheels) Transfers: Sit to/from Stand Sit to Stand: Contact guard assist                Ambulation/Gait Ambulation/Gait assistance: Supervision Gait Distance (Feet): 300  Feet Assistive device: Rollator (4 wheels) (upright) Gait Pattern/deviations: Step-through pattern, Decreased stride length Gait velocity: decr Gait velocity interpretation: 1.31 - 2.62 ft/sec, indicative of limited community ambulator   General Gait Details: Supervision for safety and lines. Pt too tall for upright rollator so used hands on forearm pads and pt able to stand fully erect   Stairs             Wheelchair Mobility     Tilt Bed    Modified Rankin (Stroke Patients Only)       Balance Overall balance assessment: Needs assistance Sitting-balance support: Feet supported, No upper extremity supported Sitting balance-Leahy Scale: Good     Standing balance support: No upper extremity supported Standing balance-Leahy Scale: Fair                              Cognition Arousal: Alert Behavior During Therapy: WFL for tasks assessed/performed Overall Cognitive Status: Within Functional Limits for tasks assessed                                          Exercises      General Comments General comments (skin integrity, edema, etc.): VSS on 2L. Difficult to get accurate SpO2 reading      Pertinent Vitals/Pain Pain Assessment Pain Assessment: Faces Faces Pain Scale: Hurts a little bit Pain Location: pocket pain Pain Descriptors / Indicators: Grimacing    Home Living  Prior Function            PT Goals (current goals can now be found in the care plan section) Acute Rehab PT Goals Patient Stated Goal: home Progress towards PT goals: Progressing toward goals    Frequency    Min 1X/week      PT Plan      Co-evaluation              AM-PAC PT "6 Clicks" Mobility   Outcome Measure  Help needed turning from your back to your side while in a flat bed without using bedrails?: A Little Help needed moving from lying on your back to sitting on the side of a flat bed without using  bedrails?: A Little Help needed moving to and from a bed to a chair (including a wheelchair)?: A Little Help needed standing up from a chair using your arms (e.g., wheelchair or bedside chair)?: A Little Help needed to walk in hospital room?: A Little Help needed climbing 3-5 steps with a railing? : Total 6 Click Score: 16    End of Session Equipment Utilized During Treatment: Oxygen Activity Tolerance: Patient tolerated treatment well Patient left: in chair;with call bell/phone within reach;with family/visitor present;with nursing/sitter in room Nurse Communication: Mobility status PT Visit Diagnosis: Other abnormalities of gait and mobility (R26.89);Muscle weakness (generalized) (M62.81)     Time: 7829-5621 PT Time Calculation (min) (ACUTE ONLY): 24 min  Charges:    $Gait Training: 23-37 mins PT General Charges $$ ACUTE PT VISIT: 1 Visit                     [facility] PT Acute Rehabilitation Services Office [phone]    Angelina Ok [facility] 05/09/2023, 4:32 PM

## 2023-05-09 NOTE — Progress Notes (Signed)
PHARMACY - ANTICOAGULATION CONSULT NOTE  Pharmacy Consult for warfarin Indication: LVAD HM3 implanted 11/14  No Known Allergies  Patient Measurements: Height: 6\' 3"  (190.5 cm) Weight: (!) 143.2 kg (315 lb 11.2 oz) IBW/kg (Calculated) : 84.5   Vital Signs: Temp: 97.9 F (36.6 C) (11/20 0700) Temp Source: Oral (11/20 0700) BP: 115/91 (11/20 0630) Pulse Rate: 80 (11/20 0630)  Labs: Recent Labs    05/07/23 0400 05/08/23 0450 05/09/23 0414  HGB 8.0* 8.1* 8.0*  HCT 24.3* 24.4* 24.3*  PLT 160 188 183  APTT 32 34 42*  LABPROT 17.5* 23.0* 28.4*  INR 1.4* 2.0* 2.6*  CREATININE 1.14 1.04 1.05    Estimated Creatinine Clearance: 125.7 mL/min (by C-G formula based on SCr of 1.05 mg/dL).   Medical History: Past Medical History:  Diagnosis Date   Abnormal EKG    Acute systolic heart failure (HCC) 09/06/2017   Angina pectoris (HCC) 09/05/2017   Body mass index 45.0-49.9, adult (HCC)    CAD S/P percutaneous coronary angioplasty 09/07/2017   mLAD and dLAD PCI with DES 09/06/17   CHF (congestive heart failure) (HCC)    Chronic systolic (congestive) heart failure (HCC) 03/19/2019   Complication of anesthesia 1995   "had hard time waking up; breathing w/vasectomy"   Coronary artery disease    Erectile dysfunction    Essential hypertension, benign    Fatigue    GERD (gastroesophageal reflux disease)    Gout    High cholesterol    "just started tx yesterday" (09/06/2017)   History of gout    "RX prn" (09/06/2017)   Ischemic cardiomyopathy 09/07/2017   EF 25-35%   Muscular chest pain    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   Below Left Nare (MOH's)   Obesity    Obstructive sleep apnea    OSA on CPAP    Plantar fasciitis    Pre-operative clearance 02/11/2018   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/02/2019   [employer] Jude   Right bundle branch block    Shortness of breath 09/05/2017   Testosterone deficiency    Type 2 diabetes mellitus  with complication, without long-term current use of insulin (HCC) 09/05/2017   Type II diabetes mellitus (HCC)    "started tx 08/28/2017"      Assessment: 52yom with heart failure s/p LVAD implant HM3 11/14. INR 1.4, Tbili 2.2, no bleeding chest tubes removed except pocket drain. INR slightly elevated at baseline (1.6).   INR 2.6 is slightly above goal today after warfarin 2.5 mg yesterday, Hgb (8.0) and Plts (247) are stable. Patient eating 75-100% of meals per chart, on clear liquid diet (ate pudding and ice pop yesterday). No new DDIs noted.   Given elevated INR and clear liquid diet will hold warfarin dose for today and consider restarting tomorrow evening. Diet changed to heart healthy today.  Goal of Therapy:  INR 2-2.5 Monitor platelets by anticoagulation protocol: Yes   Plan:  Hold warfarin today Daily PT/INR. Monitor s/s bleeding    Ernestene Kiel, PharmD PGY1 Pharmacy Resident  Please check AMION for all [facility] Pharmacy phone numbers After 10:00 PM, call Main Pharmacy [phone]  05/09/2023 8:57 AM

## 2023-05-09 NOTE — Progress Notes (Signed)
Inpatient Rehab Admissions Coordinator:   Note decrease milrinone today, hopeful to decrease EPI.  Repeat ECHO tomorrow.  K runs today.  We will continue to follow.   Estill Dooms, PT, DPT Admissions Coordinator [phone] 05/09/23  11:26 AM

## 2023-05-09 NOTE — Progress Notes (Signed)
LVAD Coordinator Rounding Note:   Admitted 04/30/23 due to for optimization prior to VAD.    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   Pt lying in bed on my arrival. Pocket drain and foley catheter removed yesterday.    Speed increased to 6200 yesterday following echo.  K+ 2.9 today; provider team replacing.   Vital signs: Temp: 98.1 HR: 83 Doppler Pressure: 90 Automatic BP: 103/45 (53) O2 Sat: 93% 3L [place] Wt:316.4>305.5>317.9>315.7lbs     LVAD interrogation reveals:  Speed: 6200 Flow: 5.6 Power: 5.2w PI: 3.9 Alarms: none Events: 20 PI Events today; 10 yesterday Hematocrit: 24 Fixed speed: 6200 Low speed limit: 5900   Drive Line: Existing VAD dressing removed and site care performed using sterile technique. Drive line exit site cleaned with Chlora prep applicators x 2, rinsed with sterile saline and allowed to dry. Silver strip applied flush with exit site. Exit site is unincorporated, the velour is fully implanted at exit site with 1 suture in place. Small amount bloody drainage. No redness, tenderness, foul odor or rash noted. Drive line anchor re-applied. Continue daily dressing changes by nurse champion or VAD Coordinator. Next dressing change 05/10/23.        Labs:  LDH trend: 275>251>260>247   Hgb trend: 9.9>8>8.1>8   INR trend: 1.3>1.4>2.0>2.6   Anticoagulation Plan: -INR Goal: 2.0-2.5 -ASA Dose: 81 mg stop date of 06/02/23   Blood Products:  Intra-op- 6 FFP 1 PLT 2 CYRO   05/05/23>>1 u/PRBC   Device: -Medtronic BiV -Therapies: OFF   Arrythmias:    Respiratory: Extubated 05/04/23   Infection:    Renal:  -BUN/CRT: 16/1.42>21/1.14>15/1.04>11/1.05  Gtts: Milrinone 0.125 mcg/kg/min Epi 4 mcg/min  VAD Education: Wife performed dressing X1 at bedside with verbal cues from this VAD Coordinator.   Adverse Events on VAD:   Plan/Recommendations:  1. Page VAD coordinators for any  equipment or drive line issues 2. Daily drive line dressing changes per nurse champion or VAD Coordinator   Carlton Adam RN, BSN VAD Coordinator 24/7 Pager [phone]

## 2023-05-09 NOTE — Progress Notes (Signed)
This RN informed that pts CBG is 68, pt currently drinking protein shake. A&O X 4.

## 2023-05-09 NOTE — Plan of Care (Signed)
  Problem: Cardiovascular: Goal: Ability to achieve and maintain adequate cardiovascular perfusion will improve Outcome: Progressing Goal: Vascular access site(s) Level 0-1 will be maintained Outcome: Progressing   Problem: Clinical Measurements: Goal: Ability to maintain clinical measurements within normal limits will improve Outcome: Progressing Goal: Will remain free from infection Outcome: Progressing Goal: Diagnostic test results will improve Outcome: Progressing Goal: Respiratory complications will improve Outcome: Progressing Goal: Cardiovascular complication will be avoided Outcome: Progressing   Problem: Activity: Goal: Risk for activity intolerance will decrease Outcome: Progressing   Problem: Coping: Goal: Level of anxiety will decrease Outcome: Progressing   Problem: Elimination: Goal: Will not experience complications related to bowel motility Outcome: Progressing Goal: Will not experience complications related to urinary retention Outcome: Progressing   Problem: Pain Management: Goal: General experience of comfort will improve Outcome: Progressing   Problem: Safety: Goal: Ability to remain free from injury will improve Outcome: Progressing   Problem: Skin Integrity: Goal: Risk for impaired skin integrity will decrease Outcome: Progressing

## 2023-05-09 NOTE — Progress Notes (Addendum)
Advanced Heart Failure VAD Team Note  PCP-Cardiologist: [name], MD   Subjective:    11/14: s/p HM3 LVAD implantation  Post op day #6  CO-OX 61% on 4 Epi + 0.25 milrinone.   CVP 6. About 5.5L UOP yesterday with IV lasix 40 BID and 5 mg metolazone.   Scr 1.05 K 2.9 Na 134  INR 2.6  Sitting up in chair. Abdomen sore from aggressive mobility yesterday.     LVAD INTERROGATION:  HeartMate HM3 LVAD: Flow 5.6, Speed 6200, PI 3.5, Power 5.2. 10 PI events so far today. No speed drops.     Objective:    Vital Signs:   Temp:  [97.7 F (36.5 C)-98.4 F (36.9 C)] 97.9 F (36.6 C) (11/20 0700) Pulse Rate:  [67-182] 80 (11/20 0630) Resp:  [0-34] 18 (11/20 0630) BP: (80-132)/(51-110) 115/91 (11/20 0630) SpO2:  [70 %-100 %] 95 % (11/20 0630) Weight:  [143.2 kg] 143.2 kg (11/20 0500) Last BM Date : 05/08/23  Mean arterial Pressure 80-90s  Intake/Output:   Intake/Output Summary (Last 24 hours) at 05/09/2023 0843 Last data filed at 05/09/2023 0800 Gross per 24 hour  Intake 3019.13 ml  Output 5215 ml  Net -2195.87 ml   Physical Exam    CVP 6. Physical Exam: GENERAL: Sitting up in chair. No distress. HEENT: normal  NECK: Supple, JVP 8 cm CARDIAC:  Mechanical heart sounds with LVAD hum present.  LUNGS:  Clear to auscultation bilaterally.  ABDOMEN:  Soft, round, nontender, + distended LVAD exit site:   Dressing dry and intact.  No erythema or drainage.  Stabilization device present and accurately applied.  EXTREMITIES:  Warm and dry, no cyanosis, clubbing, rash 1+ edema  NEUROLOGIC:  Alert and oriented x 4.  Gait steady.  No aphasia.  No dysarthria.  Affect pleasant.       Telemetry   AV paced 80  Labs   Basic Metabolic Panel: Recent Labs  Lab 05/05/23 0438 05/06/23 0414 05/07/23 0400 05/08/23 0450 05/09/23 0414  NA 132* 135 137 134* 134*  K 4.2 4.3 3.3* 3.5 2.9*  CL 102 100 98 95* 93*  CO2 22 27 30 29  33*  GLUCOSE 216* 198* 165* 127* 120*   BUN 14 18 21* 15 11  CREATININE 1.01 0.99 1.14 1.04 1.05  CALCIUM 8.1* 8.8* 8.1* 8.6* 8.3*  MG 2.3 2.2 1.9 2.0 1.9  PHOS 2.7 2.5 3.2 3.1 3.7    Liver Function Tests: Recent Labs  Lab 05/04/23 0309 05/05/23 0438 05/06/23 0414 05/08/23 0450  AST 63* 43* 34 44*  ALT 17 16 17 21   ALKPHOS 45 45 53 81  BILITOT 2.1* 2.4* 2.2* 1.3*  PROT 5.8* 5.7* 5.9* 5.8*  ALBUMIN 3.4* 3.1* 3.0* 2.7*   No results for input(s): "LIPASE", "AMYLASE" in the last 168 hours. No results for input(s): "AMMONIA" in the last 168 hours.  CBC: Recent Labs  Lab 05/05/23 0438 05/06/23 0414 05/07/23 0400 05/08/23 0450 05/09/23 0414  WBC 18.5* 16.3* 13.2* 11.3* 13.2*  NEUTROABS 15.1* 13.6* 10.2* 8.5* 10.7*  HGB 8.0* 8.3* 8.0* 8.1* 8.0*  HCT 24.9* 25.8* 24.3* 24.4* 24.3*  MCV 99.2 97.0 98.0 98.0 97.2  PLT 132* 149* 160 188 183    INR: Recent Labs  Lab 05/05/23 0438 05/06/23 0414 05/07/23 0400 05/08/23 0450 05/09/23 0414  INR 1.6* 1.4* 1.4* 2.0* 2.6*    Other results:   Imaging   DG Chest Port 1 View  Result Date: 05/08/2023 CLINICAL DATA:  Left ventricular assist  device. EXAM: PORTABLE CHEST 1 VIEW COMPARISON:  May 06, 2023. FINDINGS: Stable cardiomegaly. Left ventricular assist device is unchanged. Left-sided pacemaker is unchanged. Swan-Ganz catheter has been removed. Right-sided PICC line is unchanged. Left basilar atelectasis is noted with probable small pleural effusion. IMPRESSION: Left ventricular assist device is again noted as well as left-sided pacemaker. Stable left basilar atelectasis with probable small pleural effusion. Electronically Signed   By: Lupita Raider M.D.   On: 05/08/2023 11:26   ECHOCARDIOGRAM LIMITED  Result Date: 05/07/2023    ECHOCARDIOGRAM LIMITED REPORT   Patient Name:   Joe [name] [facility] Date of Exam: 05/07/2023 Medical Rec #:  [id number]              Height:       75.0 in Accession #:    [id number]             Weight:       305.6 lb Date of Birth:   [birth year]               BSA:          2.627 m Patient Age:    52 years               BP:           95/58 mmHg Patient Gender: M                      HR:           80 bpm. Exam Location:  Inpatient Procedure: Limited Echo and Limited Color Doppler Indications:    LVAD evaluation  History:        Patient has prior history of Echocardiogram examinations, most                 recent 05/03/2023. Cardiomyopathy and CHF, CAD, Defibrillator,                 Arrythmias:RBBB; Risk Factors:Diabetes, Sleep Apnea and                 Hypertension.  Sonographer:    Milda Smart Referring Phys: [phone] Joe [name]  Sonographer Comments: Image acquisition challenging due to patient body habitus. IMPRESSIONS  1. Limited study due to technically difficult images. HeartMate 3 LVAD at 6000 rpm.  2. The aortic valve opens with each beat. There is no meaningful pericardial effusion.  3. The left ventricle is decompressed and has severely reduced systolic function. Cannot assess regional wall motion.  4. The right ventricle is normal in size and function. A defibrillator lead is seen. FINDINGS  Additional Comments: Color Doppler performed.  LEFT VENTRICLE PLAX 2D LVIDd:         5.10 cm LVIDs:         4.50 cm LV PW:         1.00 cm LV IVS:        0.90 cm  Mihai Croitoru MD Electronically signed by Thurmon Fair MD Signature Date/Time: 05/07/2023/6:01:37 PM    Final     Medications:    Scheduled Medications:  allopurinol  100 mg Oral Daily   amiodarone  200 mg Oral BID   aspirin EC  81 mg Oral Daily   bisacodyl  10 mg Oral Daily   Or   bisacodyl  10 mg Rectal Daily   Chlorhexidine Gluconate Cloth  6 each Topical Daily   escitalopram  10 mg Oral QHS   ezetimibe  10  mg Oral Daily   Fe Fum-Vit C-Vit B12-FA  1 capsule Oral QPC breakfast   furosemide  40 mg Intravenous BID   gabapentin  400 mg Oral TID   insulin aspart  0-24 Units Subcutaneous TID WC & HS   insulin aspart  6 Units Subcutaneous TID WC   insulin detemir  38  Units Subcutaneous BID   levothyroxine  25 mcg Oral Daily   metoCLOPramide (REGLAN) injection  10 mg Intravenous Q6H   mexiletine  250 mg Oral Q12H   potassium chloride  20 mEq Oral Q4H   rosuvastatin  40 mg Oral Daily   sodium chloride flush  10-40 mL Intracatheter Q12H   sodium chloride flush  3 mL Intravenous Q12H   Warfarin - Pharmacist Dosing Inpatient   Does not apply q1600    Infusions:  epinephrine 4 mcg/min (05/09/23 0600)   milrinone 0.25 mcg/kg/min (05/09/23 0623)    PRN Medications: acetaminophen, dextrose, ondansetron (ZOFRAN) IV, mouth rinse, oxyCODONE, polyvinyl alcohol, sodium chloride flush, traMADol  Patient Profile   Joe [name] is a 52 y.o. male with h/o morbid obesity, DM2, HTN, HL, OSA, CAD and systolic HF.  Now s/p HM3 LVAD Insertion 11/14 by Dr. Donata Clay.  Assessment/Plan:    1.  Chronic Systolic HF s/p HM3 Insertion 16/10 - Etiology: iCM - Echo (8/24): EF 20-25%, RV ok. - Echo 05/01/23: EF 20%, RV moderately down (improved) - R/LHC (9/24): stable 3v CAD, elevated biventricular filling pressures with low PAPi; CI 2.2 - RHC (11/24): RA 9, PA mean 28, PCWP 19, Fick CI 1.9, TD CI 2.2, PVR 1.6, PAPi 2.4 - S/p HM III LVAD 05/03/23 by Dr. Maren Beach - Received FFP x6, Cryo x2, PLT x1, and DDAVP in OR - Intra-op TEE - EF 20-25%, RV moderately reduced, IV septum with mild rightward bowing, AV opens intermittently - Limited Echo 11/18: LV decompressed, RV appeared dilated but difficult windows.  - CO-OX 61% this am on 0.25 milrinone + 4 Epi. Wean milrinone to 0.125. Hopefully this will facilitate Epi wean. Check CO-OX around 2 pm. - Continue IV lasix 40 BID. Can increase dose if needed. Will hold off on additional metolazone d/t recurrent hypokalemia that has resulted in prior ICD shocks. - Repeat limited echo tomorrow. - INR 2.6 today. Has received 3 doses of Warfarin. ? Diet contributing. Switched from clear liquids to heart healthy.  2. H/o VT Arrest  Hx  PVCs - Followed by Dr. Elberta Fortis - Admitted 10/10-10/13/24 w/ VF treated w/ ICD shock>>loaded w/ IV amio  - Re-admitted 04/08/23 w/ VT arrest, treated w/ appropriate ICD shock x 2 + brief bystander CPR  - in setting of underling CM w/ EF < 35% + intermittent hypokalemia  - Continue Amio 200 mg bid and Mexiletine 250 mg bid   3. CAD with chronic stable angina - s/p multiple stents to LAD and LCX - Cath 9/22 stable CAD - LHC (9/24) with stable 3v CAD - Continue ASA + Crestor 40mg  daily  4. AKI  - Now at baseline (sCr 1.01) - Keep off SGLT2i  - Avoid hypotension - Follow BMET   5. DM2 - A1c 7.2 (1/24) - SSI   6. OSA - Not using CPAP, says machine was recalled a while ago. Needs new machine. - Followed outpatient   7. Hypokalemia - K 2.9, Mag 1.9 - Supp aggressively today   Length of Stay: 9  [facility], Demonica Farrey N, PA-C 05/09/2023, 8:43 AM  VAD Team --- VAD ISSUES ONLY--- Pager  [phone] (7am - 7am)  Advanced Heart Failure Team  Pager [phone] (M-F; 7a - 5p)  Please contact [facility] for night-coverage after hours (5p -7a ) and weekends on [email or web address]

## 2023-05-10 ENCOUNTER — Inpatient Hospital Stay (HOSPITAL_COMMUNITY): Payer: 59

## 2023-05-10 DIAGNOSIS — M7989 Other specified soft tissue disorders: Secondary | ICD-10-CM | POA: Diagnosis not present

## 2023-05-10 DIAGNOSIS — I5022 Chronic systolic (congestive) heart failure: Secondary | ICD-10-CM | POA: Diagnosis not present

## 2023-05-10 DIAGNOSIS — I255 Ischemic cardiomyopathy: Secondary | ICD-10-CM | POA: Diagnosis not present

## 2023-05-10 DIAGNOSIS — I5021 Acute systolic (congestive) heart failure: Secondary | ICD-10-CM | POA: Diagnosis not present

## 2023-05-10 DIAGNOSIS — I509 Heart failure, unspecified: Secondary | ICD-10-CM | POA: Diagnosis not present

## 2023-05-10 LAB — BASIC METABOLIC PANEL
Anion gap: 7 (ref 5–15)
Anion gap: 9 (ref 5–15)
BUN: 12 mg/dL (ref 6–20)
BUN: 14 mg/dL (ref 6–20)
CO2: 34 mmol/L — ABNORMAL HIGH (ref 22–32)
CO2: 32 mmol/L (ref 22–32)
Calcium: 8.1 mg/dL — ABNORMAL LOW (ref 8.9–10.3)
Calcium: 8.3 mg/dL — ABNORMAL LOW (ref 8.9–10.3)
Chloride: 91 mmol/L — ABNORMAL LOW (ref 98–111)
Chloride: 92 mmol/L — ABNORMAL LOW (ref 98–111)
Creatinine, Ser: 0.92 mg/dL (ref 0.61–1.24)
Creatinine, Ser: 0.87 mg/dL (ref 0.61–1.24)
GFR, Estimated: >60 mL/min (ref >60)
GFR, Estimated: >60 mL/min (ref >60)
Glucose, Bld: 89 mg/dL (ref 70–99)
Glucose, Bld: 89 mg/dL (ref 70–99)
Potassium: 3.5 mmol/L (ref 3.5–5.1)
Potassium: 3.8 mmol/L (ref 3.5–5.1)
Sodium: 132 mmol/L — ABNORMAL LOW (ref 135–145)
Sodium: 133 mmol/L — ABNORMAL LOW (ref 135–145)

## 2023-05-10 LAB — CBC WITH DIFFERENTIAL/PLATELET
Abs Immature Granulocytes: 0.16 10*3/uL — ABNORMAL HIGH (ref 0.00–0.07)
Basophils Absolute: 0 10*3/uL (ref 0.0–0.1)
Basophils Relative: 0 %
Eosinophils Absolute: 0.2 10*3/uL (ref 0.0–0.5)
Eosinophils Relative: 2 %
HCT: 25.5 % — ABNORMAL LOW (ref 39.0–52.0)
Hemoglobin: 8.3 g/dL — ABNORMAL LOW (ref 13.0–17.0)
Immature Granulocytes: 2 %
Lymphocytes Relative: 8 %
Lymphs Abs: 0.8 10*3/uL (ref 0.7–4.0)
MCH: 31.7 pg (ref 26.0–34.0)
MCHC: 32.5 g/dL (ref 30.0–36.0)
MCV: 97.3 fL (ref 80.0–100.0)
Monocytes Absolute: 1.3 10*3/uL — ABNORMAL HIGH (ref 0.1–1.0)
Monocytes Relative: 13 %
Neutro Abs: 6.9 10*3/uL (ref 1.7–7.7)
Neutrophils Relative %: 75 %
Platelets: 231 10*3/uL (ref 150–400)
RBC: 2.62 MIL/uL — ABNORMAL LOW (ref 4.22–5.81)
RDW: 17.7 % — ABNORMAL HIGH (ref 11.5–15.5)
WBC: 9.3 10*3/uL (ref 4.0–10.5)
nRBC: 0.9 % — ABNORMAL HIGH (ref 0.0–0.2)

## 2023-05-10 LAB — COOXEMETRY PANEL
Carboxyhemoglobin: 2.4 % — ABNORMAL HIGH (ref 0.5–1.5)
Carboxyhemoglobin: 2.1 % — ABNORMAL HIGH (ref 0.5–1.5)
Methemoglobin: <0.7 % (ref 0.0–1.5)
Methemoglobin: <0.7 % (ref 0.0–1.5)
O2 Saturation: 71 %
O2 Saturation: 52.6 %
Total hemoglobin: 8.9 g/dL — ABNORMAL LOW (ref 12.0–16.0)
Total hemoglobin: 8.8 g/dL — ABNORMAL LOW (ref 12.0–16.0)

## 2023-05-10 LAB — PROTIME-INR
INR: 2.1 — ABNORMAL HIGH (ref 0.8–1.2)
Prothrombin Time: 23.5 s — ABNORMAL HIGH (ref 11.4–15.2)

## 2023-05-10 LAB — PHOSPHORUS: Phosphorus: 4 mg/dL (ref 2.5–4.6)

## 2023-05-10 LAB — BRAIN NATRIURETIC PEPTIDE: B Natriuretic Peptide: 196.1 pg/mL — ABNORMAL HIGH (ref 0.0–100.0)

## 2023-05-10 LAB — GLUCOSE, CAPILLARY
Glucose-Capillary: 70 mg/dL (ref 70–99)
Glucose-Capillary: 161 mg/dL — ABNORMAL HIGH (ref 70–99)
Glucose-Capillary: 82 mg/dL (ref 70–99)
Glucose-Capillary: 157 mg/dL — ABNORMAL HIGH (ref 70–99)

## 2023-05-10 LAB — MAGNESIUM: Magnesium: 2.1 mg/dL (ref 1.7–2.4)

## 2023-05-10 LAB — ECHOCARDIOGRAM LIMITED
Height: 75 in
Weight: 5008.85 [oz_av]

## 2023-05-10 LAB — LACTATE DEHYDROGENASE: LDH: 251 U/L — ABNORMAL HIGH (ref 98–192)

## 2023-05-10 MED ORDER — POTASSIUM CHLORIDE CRYS ER 20 MEQ PO TBCR
40.0000 meq | EXTENDED_RELEASE_TABLET | Freq: Once | ORAL | Status: AC
  Administered 2023-05-10: 40 meq via ORAL
  Filled 2023-05-10: qty 2

## 2023-05-10 MED ORDER — WARFARIN SODIUM 2.5 MG PO TABS
2.5000 mg | ORAL_TABLET | Freq: Once | ORAL | Status: AC
  Administered 2023-05-10: 2.5 mg via ORAL
  Filled 2023-05-10: qty 1

## 2023-05-10 MED ORDER — POTASSIUM CHLORIDE CRYS ER 20 MEQ PO TBCR
40.0000 meq | EXTENDED_RELEASE_TABLET | ORAL | Status: AC
  Administered 2023-05-10 (×2): 40 meq via ORAL
  Filled 2023-05-10 (×2): qty 2

## 2023-05-10 MED ORDER — LIVING WELL WITH DIABETES BOOK
Freq: Once | Status: AC
  Filled 2023-05-10: qty 1

## 2023-05-10 NOTE — Progress Notes (Signed)
Echocardiogram 2D Echocardiogram has been performed.  Ocie Doyne RDCS 05/10/2023, 8:30 AM

## 2023-05-10 NOTE — Progress Notes (Signed)
HeartMate 3 Rounding Note Status post implantation of HeartMate 3 May 03, 2023  Subjective:   Postop day 7 HeartMate 3 implant  Patient examined and images of today's 2D echo personally reviewed showing good RV function. Continues to progress, weaning Mil, epi off Coox this am 65% VAD parameters have been stable Speed remains at 6200 RPM with VAD flows 5.6-5.8 L  INR now 2.1 Good urine output after twice daily Lasix dosing 40 mg IV.  Foley catheter has been removed. R internal jugular sheath is out , PICC for cvp, coox Patient has been weaned off low-dose milrinone and epinephrine with plans to transfer to stepdown unit today.  Exit site of the driveline is clean and dry. LVAD INTERROGATION:  HeartMate II LVAD:  Flow 5.6 liters/min, speed 6200, power 4.2, PI 3.1.  Controller intact.   Objective:    Vital Signs:   Temp:  [97.7 F (36.5 C)-99.5 F (37.5 C)] 98.2 F (36.8 C) (11/21 0800) Pulse Rate:  [61-176] 145 (11/21 1330) Resp:  [14-34] 20 (11/21 1330) BP: (58-113)/(36-89) 78/58 (11/21 1200) SpO2:  [66 %-99 %] 89 % (11/21 1330) Weight:  [142 kg] 142 kg (11/21 0500) Last BM Date : 05/10/23 Mean arterial Pressure 80-85  Intake/Output:   Intake/Output Summary (Last 24 hours) at 05/10/2023 1411 Last data filed at 05/10/2023 1200 Gross per 24 hour  Intake 341.05 ml  Output 4650 ml  Net -4308.95 ml     Physical Exam: General:  Well appearing. No resp difficulty    Sternal incision dry, clean. VAD tunnel dressing dry HEENT: normal Neck: supple. JVP . Carotids no bruits. No lymphadenopathy or thryomegaly appreciated. Cor: Mechanical heart sounds with LVAD hum present. Lungs: clear Abdomen: soft, nontender, nondistended. No hepatosplenomegaly. No bruits or masses. Good bowel sounds. Extremities: no cyanosis, clubbing, rash, edema Neuro: alert & orientedx3, cranial nerves grossly intact. moves all 4 extremities w/o difficulty. Affect pleasant  Telemetry: A-V  sequentially paced at 80 bpm  Labs: Basic Metabolic Panel: Recent Labs  Lab 05/06/23 0414 05/07/23 0400 05/08/23 0450 05/09/23 0414 05/09/23 1334 05/10/23 0444  NA 135 137 134* 134* 135 132*  K 4.3 3.3* 3.5 2.9* 3.1* 3.5  CL 100 98 95* 93* 92* 91*  CO2 27 30 29  33* 33* 34*  GLUCOSE 198* 165* 127* 120* 114* 89  BUN 18 21* 15 11 9 12   CREATININE 0.99 1.14 1.04 1.05 0.88 0.92  CALCIUM 8.8* 8.1* 8.6* 8.3* 8.1* 8.1*  MG 2.2 1.9 2.0 1.9  --  2.1  PHOS 2.5 3.2 3.1 3.7  --  4.0    Liver Function Tests: Recent Labs  Lab 05/04/23 0309 05/05/23 0438 05/06/23 0414 05/08/23 0450  AST 63* 43* 34 44*  ALT 17 16 17 21   ALKPHOS 45 45 53 81  BILITOT 2.1* 2.4* 2.2* 1.3*  PROT 5.8* 5.7* 5.9* 5.8*  ALBUMIN 3.4* 3.1* 3.0* 2.7*   No results for input(s): "LIPASE", "AMYLASE" in the last 168 hours. No results for input(s): "AMMONIA" in the last 168 hours.  CBC: Recent Labs  Lab 05/06/23 0414 05/07/23 0400 05/08/23 0450 05/09/23 0414 05/10/23 0444  WBC 16.3* 13.2* 11.3* 13.2* 9.3  NEUTROABS 13.6* 10.2* 8.5* 10.7* 6.9  HGB 8.3* 8.0* 8.1* 8.0* 8.3*  HCT 25.8* 24.3* 24.4* 24.3* 25.5*  MCV 97.0 98.0 98.0 97.2 97.3  PLT 149* 160 188 183 231    INR: Recent Labs  Lab 05/06/23 0414 05/07/23 0400 05/08/23 0450 05/09/23 0414 05/10/23 0444  INR 1.4* 1.4* 2.0*  2.6* 2.1*    Other results: EKG:   Imaging: ECHOCARDIOGRAM LIMITED  Result Date: 05/10/2023    ECHOCARDIOGRAM LIMITED REPORT   Patient Name:   Mats Heaster Date of Exam: 05/10/2023 Medical Rec #:  [id number]              Height:       75.0 in Accession #:    [id number]             Weight:       313.1 lb Date of Birth:  [birth year]               BSA:          2.655 m Patient Age:    52 years               BP:           107/85 mmHg Patient Gender: M                      HR:           82 bpm. Exam Location:  Inpatient Procedure: Limited Echo, Color Doppler and Cardiac Doppler Indications:    I50.21 Acute systolic  (congestive) heart failure  History:        Patient has prior history of Echocardiogram examinations, most                 recent 05/07/2023.  Sonographer:    Vern Claude Referring Phys: Benay Spice [facility]  Sonographer Comments: Technically difficult study due to poor echo windows. IMPRESSIONS  1. Left ventricular endocardial border not optimally defined to evaluate regional wall motion.  2. Right ventricular systolic function was not well visualized.  3. The mitral valve was not well visualized.  4. The aortic valve was not well visualized.  5. Very poor windows with essentially inadequate windows in all views to comment on structure. FINDINGS  Left Ventricle: Left ventricular endocardial border not optimally defined to evaluate regional wall motion. Right Ventricle: Right ventricular systolic function was not well visualized. Left Atrium: Left atrial size was not well visualized. Right Atrium: Right atrial size was not well visualized. Mitral Valve: The mitral valve was not well visualized. Tricuspid Valve: The tricuspid valve is not well visualized. Aortic Valve: The aortic valve was not well visualized. Pulmonic Valve: The pulmonic valve was not assessed. Aorta: Aortic root could not be assessed. Venous: The inferior vena cava was not well visualized. Additional Comments: Spectral Doppler performed. Color Doppler performed.  RIGHT VENTRICLE RV S prime:     5.44 cm/s TAPSE (M-mode): 1.2 cm Armanda Magic MD Electronically signed by Armanda Magic MD Signature Date/Time: 05/10/2023/11:51:04 AM    Final    DG Chest Port 1 View  Result Date: 05/10/2023 CLINICAL DATA:  Left ventricular assist device. EXAM: PORTABLE CHEST 1 VIEW COMPARISON:  May 09, 2023. FINDINGS: Stable cardiomegaly. Left-sided pacemaker is unchanged. Right-sided PICC line is unchanged. Left ventricular assist device is unchanged. Right lung is clear. Stable left basilar atelectasis and effusion is noted. IMPRESSION: No significant changes  are noted. Electronically Signed   By: Lupita Raider M.D.   On: 05/10/2023 11:26   DG Chest Port 1 View  Result Date: 05/09/2023 CLINICAL DATA:  Left ventricular assist device. EXAM: PORTABLE CHEST 1 VIEW COMPARISON:  May 08, 2023. FINDINGS: Stable cardiomegaly. Left-sided defibrillator is unchanged. Left ventricular assist device is again noted. Right-sided PICC line is again noted. Left basilar atelectasis  is noted with possible small pleural effusion. IMPRESSION: Stable support apparatus. Left basilar atelectasis and probable small pleural effusion. Electronically Signed   By: Lupita Raider M.D.   On: 05/09/2023 11:58     Medications:     Scheduled Medications:  allopurinol  100 mg Oral Daily   amiodarone  200 mg Oral BID   aspirin EC  81 mg Oral Daily   bisacodyl  10 mg Oral Daily   Or   bisacodyl  10 mg Rectal Daily   Chlorhexidine Gluconate Cloth  6 each Topical Daily   escitalopram  10 mg Oral QHS   ezetimibe  10 mg Oral Daily   Fe Fum-Vit C-Vit B12-FA  1 capsule Oral QPC breakfast   furosemide  40 mg Intravenous BID   gabapentin  400 mg Oral TID   insulin aspart  0-24 Units Subcutaneous TID WC & HS   insulin detemir  20 Units Subcutaneous BID   levothyroxine  25 mcg Oral Daily   mexiletine  250 mg Oral Q12H   potassium chloride  40 mEq Oral Q4H   rosuvastatin  40 mg Oral Daily   sodium chloride flush  10-40 mL Intracatheter Q12H   sodium chloride flush  3 mL Intravenous Q12H   spironolactone  12.5 mg Oral Daily   warfarin  2.5 mg Oral ONCE-1600   Warfarin - Pharmacist Dosing Inpatient   Does not apply q1600    Infusions:  epinephrine 1 mcg/min (05/10/23 1200)    PRN Medications: acetaminophen, antiseptic oral rinse, dextrose, ondansetron (ZOFRAN) IV, mouth rinse, oxyCODONE, polyvinyl alcohol, sodium chloride flush, traMADol   Assessment:   Morbidly obese 52 year old male with type 2 diabetes, sleep apnea and ischemic cardiomyopathy with EF of  10%.  Pre and postoperative RV function appear to be adequate.  Patient was extubated postop day 1 and has been mobilized out of bed to the chair with PA catheter removed on postop day 4 following Limited echo showing adequate RV function.  Inotropes weaned to low-dose epinephrine and milrinone postop day 5. Being followed by CIR POD 6 Plan/Discussion:    Patient ready for transfer to 2C now  that he has been weaned off inotropes.  2D echo shows that satisfactory RV function. Continue diuresis and physical therapy   I reviewed the LVAD parameters from today, and compared the results to the patient's prior recorded data.  No programming changes were made.  The LVAD is functioning within specified parameters.  The patient performs LVAD self-test daily.  LVAD interrogation was negative for any significant power changes, alarms or PI events/speed drops.  LVAD equipment check completed and is in good working order.  Back-up equipment present.   LVAD education done on emergency procedures and precautions and reviewed exit site care.  Length of Stay: 10  Lovett Sox 05/10/2023, 2:11 PM

## 2023-05-10 NOTE — Progress Notes (Signed)
PHARMACY - ANTICOAGULATION CONSULT NOTE  Pharmacy Consult for warfarin Indication: LVAD HM3 implanted 11/14  No Known Allergies  Patient Measurements: Height: 6\' 3"  (190.5 cm) Weight: (!) 142 kg (313 lb 0.9 oz) IBW/kg (Calculated) : 84.5   Vital Signs: Temp: 97.9 F (36.6 C) (11/21 0650) Temp Source: Axillary (11/21 0650) BP: 107/85 (11/21 0545) Pulse Rate: 91 (11/21 0545)  Labs: Recent Labs    05/08/23 0450 05/09/23 0414 05/09/23 1334 05/10/23 0444  HGB 8.1* 8.0*  --  8.3*  HCT 24.4* 24.3*  --  25.5*  PLT 188 183  --  231  APTT 34 42*  --   --   LABPROT 23.0* 28.4*  --  23.5*  INR 2.0* 2.6*  --  2.1*  CREATININE 1.04 1.05 0.88 0.92    Estimated Creatinine Clearance: 142.8 mL/min (by C-G formula based on SCr of 0.92 mg/dL).   Medical History: Past Medical History:  Diagnosis Date   Abnormal EKG    Acute systolic heart failure (HCC) 09/06/2017   Angina pectoris (HCC) 09/05/2017   Body mass index 45.0-49.9, adult (HCC)    CAD S/P percutaneous coronary angioplasty 09/07/2017   mLAD and dLAD PCI with DES 09/06/17   CHF (congestive heart failure) (HCC)    Chronic systolic (congestive) heart failure (HCC) 03/19/2019   Complication of anesthesia 1995   "had hard time waking up; breathing w/vasectomy"   Coronary artery disease    Erectile dysfunction    Essential hypertension, benign    Fatigue    GERD (gastroesophageal reflux disease)    Gout    High cholesterol    "just started tx yesterday" (09/06/2017)   History of gout    "RX prn" (09/06/2017)   Ischemic cardiomyopathy 09/07/2017   EF 25-35%   Muscular chest pain    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   Below Left Nare (MOH's)   Obesity    Obstructive sleep apnea    OSA on CPAP    Plantar fasciitis    Pre-operative clearance 02/11/2018   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/02/2019   [employer]   Right bundle branch block    Shortness of breath 09/05/2017    Testosterone deficiency    Type 2 diabetes mellitus with complication, without long-term current use of insulin (HCC) 09/05/2017   Type II diabetes mellitus (HCC)    "started tx 08/28/2017"    Assessment: Joe [name] with heart failure s/p LVAD implant HM3 11/14. INR 1.4, Tbili 2.2, no bleeding chest tubes removed except pocket drain. INR slightly elevated at baseline (1.6).   INR 2.1 is therapeutic today after holding warfarin yesterday, Hgb (8.3) and Plts (251) are stable. Patient eating 100% of meals per chart, patient ate more substantial food yesterday (chicken and pasta, cereal). No new DDIs noted.    Goal of Therapy:  INR 2-2.5 Monitor platelets by anticoagulation protocol: Yes   Plan:  Give warfarin 2.5 mg x1 today Daily PT/INR. Monitor s/s bleeding    Ernestene Kiel, PharmD PGY1 Pharmacy Resident  Please check AMION for all [facility] Pharmacy phone numbers After 10:00 PM, call Main Pharmacy [phone]  05/10/2023 8:18 AM

## 2023-05-10 NOTE — Progress Notes (Signed)
Left upper extremity venous duplex has been completed. Preliminary results can be found in CV Proc through chart review.   05/10/23 2:26 PM Olen Cordial RVT

## 2023-05-10 NOTE — Progress Notes (Addendum)
Attestation signed by [name], MD at 05/10/2023  2:05 PM   Patient seen with PA/NP, agree with the above note.    Subjective:   No complaints this AM, excellent urine output.    Exam: General: NAD HEENT: Normal.  Lungs: Clear to auscultation bilaterally with normal WOB CV: LVAD hum, JVP mildly elevated, trace LE edema Abdomen: Soft, nontender, no distention, driveline examined, dressing c/d/I, well incorporated. Skin: Intact without lesions or rashes.  Neurologic: awake/alert, no gross FND.  Psych: Normal affect. Extremities: No clubbing or cyanosis.    HeartMate HM3 LVAD: Flow 6.0, Speed 6200, PI 3.7, Power 5.3. 3 PI events so far today. No speed drops.     I reviewed the LVAD parameters from today, and compared the results to the patient's prior recorded data.  No programming changes were made.  The LVAD is functioning within specified parameters.  The patient performs LVAD self-test daily.  LVAD interrogation was negative for any significant power changes, alarms or PI events/speed drops.  LVAD equipment check completed and is in good working order.  Back-up equipment present.   LVAD education done on emergency procedures and precautions and reviewed exit site care.      A/P   Patient s/p LVAD 11/14, recovering well. Responding appropriately to diuresis. Coox 71, wean milrinone off today. INR 2.1 after holding dose. Hopefully wean off epi and downgrade to the floor later today.   Recommendations:  - IV lasix 40mg  BID - Spironolactone 12.5mg  daily - Turn off milrinone - Wean Epi as tolerated - Echo ordered  Clearnce Hasten, MD           Expand All Collapse All    Advanced Heart Failure VAD Team Note   PCP-Cardiologist: [name], MD    Subjective:     11/14: s/p HM3 LVAD implantation. Post op day #7   Epi 4 + Milrinone 0.125. Continue to wean today. Co-ox 71%. CVP 10 5L UOP on Lasix 40mg  IV BID.   INR now 2.1. Warfarin per PharmD dosing.    Sitting up in bed. Feeling well this morning. No SOB. No CP. On RA.    LVAD INTERROGATION:  HeartMate HM3 LVAD: Flow 6.0, Speed 6200, PI 3.7, Power 5.3. 3 PI events so far today. No speed drops.     Objective:     Vital Signs:                 Temp:  [97.7 F (36.5 C)-99.5 F (37.5 C)] 98.2 F (36.8 C) (11/21 0800) Pulse Rate:  [61-176] 133 (11/21 0930) Resp:  [11-34] 14 (11/21 0930) BP: (81-113)/(42-89) 102/80 (11/21 0800) SpO2:  [66 %-100 %] 95 % (11/21 0930) Weight:  [142 kg] 142 kg (11/21 0500) Last BM Date : 05/09/23   Mean arterial Pressure 80-90s   Intake/Output:              Intake/Output Summary (Last 24 hours) at 05/10/2023 1042 Last data filed at 05/10/2023 1000    Gross per 24 hour  Intake 361.4 ml  Output 5450 ml  Net -5088.6 ml    Physical Exam    CVP 10 General: Well appearing. No distress on RA HEENT: neck supple. RIJ scab Cardiac: JVP JVP not visualized. Mechanical heart sounds with LVAD hum present.  Resp: Lung sounds clear and equal B/L Abdomen: Soft, non-tender, non-distended. + BS. Driveline: Dressing C/D/I. No drainage or redness. Anchor in place. Extremities: Warm and dry. No rash, cyanosis, or edema. RUE PICC Neuro:  Alert and oriented x3. Affect pleasant. Moves all extremities without difficulty.   Telemetry    AV paced 80 (personally reviewed)   Labs    Basic Metabolic Panel: Last Labs          Recent Labs  Lab 05/06/23 0414 05/07/23 0400 05/08/23 0450 05/09/23 0414 05/09/23 1334 05/10/23 0444  NA 135 137 134* 134* 135 132*  K 4.3 3.3* 3.5 2.9* 3.1* 3.5  CL 100 98 95* 93* 92* 91*  CO2 27 30 29  33* 33* 34*  GLUCOSE 198* 165* 127* 120* 114* 89  BUN 18 21* 15 11 9 12   CREATININE 0.99 1.14 1.04 1.05 0.88 0.92  CALCIUM 8.8* 8.1* 8.6* 8.3* 8.1* 8.1*  MG 2.2 1.9 2.0 1.9  --  2.1  PHOS 2.5 3.2 3.1 3.7  --  4.0        Liver Function Tests: Last Labs        Recent Labs  Lab 05/04/23 0309 05/05/23 0438 05/06/23 0414  05/08/23 0450  AST 63* 43* 34 44*  ALT 17 16 17 21   ALKPHOS 45 45 53 81  BILITOT 2.1* 2.4* 2.2* 1.3*  PROT 5.8* 5.7* 5.9* 5.8*  ALBUMIN 3.4* 3.1* 3.0* 2.7*      Last Labs  No results for input(s): "LIPASE", "AMYLASE" in the last 168 hours.   Last Labs  No results for input(s): "AMMONIA" in the last 168 hours.     CBC: Last Labs         Recent Labs  Lab 05/06/23 0414 05/07/23 0400 05/08/23 0450 05/09/23 0414 05/10/23 0444  WBC 16.3* 13.2* 11.3* 13.2* 9.3  NEUTROABS 13.6* 10.2* 8.5* 10.7* 6.9  HGB 8.3* 8.0* 8.1* 8.0* 8.3*  HCT 25.8* 24.3* 24.4* 24.3* 25.5*  MCV 97.0 98.0 98.0 97.2 97.3  PLT 149* 160 188 183 231        INR: Last Labs         Recent Labs  Lab 05/06/23 0414 05/07/23 0400 05/08/23 0450 05/09/23 0414 05/10/23 0444  INR 1.4* 1.4* 2.0* 2.6* 2.1*        Other results:     Imaging     Imaging Results (Last 48 hours)  DG Chest Port 1 View   Result Date: 05/09/2023 CLINICAL DATA:  Left ventricular assist device. EXAM: PORTABLE CHEST 1 VIEW COMPARISON:  May 08, 2023. FINDINGS: Stable cardiomegaly. Left-sided defibrillator is unchanged. Left ventricular assist device is again noted. Right-sided PICC line is again noted. Left basilar atelectasis is noted with possible small pleural effusion. IMPRESSION: Stable support apparatus. Left basilar atelectasis and probable small pleural effusion. Electronically Signed   By: Lupita Raider M.D.   On: 05/09/2023 11:58       Medications:     Scheduled Medications:  allopurinol  100 mg Oral Daily   amiodarone  200 mg Oral BID   aspirin EC  81 mg Oral Daily   bisacodyl  10 mg Oral Daily    Or   bisacodyl  10 mg Rectal Daily   Chlorhexidine Gluconate Cloth  6 each Topical Daily   escitalopram  10 mg Oral QHS   ezetimibe  10 mg Oral Daily   Fe Fum-Vit C-Vit B12-FA  1 capsule Oral QPC breakfast   furosemide  40 mg Intravenous BID   gabapentin  400 mg Oral TID   insulin aspart  0-24 Units  Subcutaneous TID WC & HS   insulin detemir  20 Units Subcutaneous BID   levothyroxine  25  mcg Oral Daily   living well with diabetes book   Does not apply Once   mexiletine  250 mg Oral Q12H   rosuvastatin  40 mg Oral Daily   sodium chloride flush  10-40 mL Intracatheter Q12H   sodium chloride flush  3 mL Intravenous Q12H   spironolactone  12.5 mg Oral Daily   Warfarin - Pharmacist Dosing Inpatient   Does not apply q1600          Infusions:  epinephrine 2 mcg/min (05/10/23 0605)   milrinone 0.125 mcg/kg/min (05/10/23 0548)          PRN Medications:  acetaminophen, antiseptic oral rinse, dextrose, ondansetron (ZOFRAN) IV, mouth rinse, oxyCODONE, polyvinyl alcohol, sodium chloride flush, traMADol       Patient Profile    Joe [name] is a 52 y.o. male with h/o morbid obesity, DM2, HTN, HL, OSA, CAD and systolic HF.  Now s/p HM3 LVAD Insertion 11/14 by Dr. Donata Clay.   Assessment/Plan:     1.  Chronic Systolic HF s/p HM3 Insertion 81/19 - Etiology: iCM - Echo (8/24): EF 20-25%, RV ok. - Echo 05/01/23: EF 20%, RV moderately down (improved) - R/LHC (9/24): stable 3v CAD, elevated biventricular filling pressures with low PAPi; CI 2.2 - RHC (11/24): RA 9, PA mean 28, PCWP 19, Fick CI 1.9, TD CI 2.2, PVR 1.6, PAPi 2.4 - S/p HM III LVAD 05/03/23 by Dr. Maren Beach - Received FFP x6, Cryo x2, PLT x1, and DDAVP in OR - Intra-op TEE - EF 20-25%, RV moderately reduced, IV septum with mild rightward bowing, AV opens intermittently - Limited Echo 11/18: LV decompressed, RV appeared dilated but difficult windows.  - Co-ox 71.Stop Milrinone. NE at 2. Repeat Coox this afternoon. - Continue IV lasix 40 BID. 5L UOP yesterday. Will hold off on additional metolazone d/t recurrent hypokalemia that has resulted in prior ICD shocks. - Discussed warfarin dosing with PharmD. INR 2.1 today.  - Repeat Echo today   2. H/o VT Arrest  Hx PVCs - Followed by Dr. Elberta Fortis - Admitted 10/10-10/13/24 w/  VF treated w/ ICD shock>>loaded w/ IV amio  - Re-admitted 04/08/23 w/ VT arrest, treated w/ appropriate ICD shock x 2 + brief bystander CPR  - in setting of underling CM w/ EF < 35% + intermittent hypokalemia  - Continue Amio 200 mg bid and Mexiletine 250 mg bid   3. CAD with chronic stable angina - s/p multiple stents to LAD and LCX - Cath 9/22 stable CAD - LHC (9/24) with stable 3v CAD - Continue ASA + Crestor 40mg  daily   4. AKI  - Now at baseline (sCr 1.01) - Keep off SGLT2i in acute post-op - Avoid hypotension - Follow BMET   5. DM2 - A1c 7.2 (1/24) - SSI   6. OSA - Not using CPAP, says machine was recalled a while ago. Needs new machine. - Followed outpatient   7. Hypokalemia - K 3.5 - Supp today   Length of Stay: 10   [place] Lee, NP 05/10/2023, 10:42 AM   VAD Team --- VAD ISSUES ONLY--- Pager [phone] (7am - 7am)   Advanced Heart Failure Team  Pager [phone] (M-F; 7a - 5p)  Please contact [facility] for night-coverage after hours (5p -7a ) and weekends on [email or web address]

## 2023-05-10 NOTE — Progress Notes (Signed)
LVAD Coordinator Rounding Note:   Admitted 04/30/23 due to for optimization prior to VAD.    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   Pt sitting on the side of the bed today. Tells me that he feels good.    Vital signs: Temp: 98.1 HR: 82 Doppler Pressure: 76 Automatic BP: 89/49 (60) O2 Sat: 92% 3L [place] Wt:316.4>305.5>317.9>315.7>313lbs     LVAD interrogation reveals:  Speed: 6200 Flow: 6.3 Power: 5.3w PI: 2.4 Alarms: none Events: 15 PI Events today; 20 yesterday Hematocrit: 25 Fixed speed: 6200 Low speed limit: 5900   Drive Line: Existing VAD dressing removed and site care performed using sterile technique by wife Lenore observered by VAD coordinator. Drive line exit site cleaned with Chlora prep applicators x 2, rinsed with sterile saline and allowed to dry. Silver strip applied flush with exit site. Exit site is unincorporated, the velour is fully implanted at exit site with 1 suture in place. Moderate amount bloody drainage. No redness, tenderness, foul odor or rash noted. Drive line anchor re-applied. Continue daily dressing changes by nurse champion or VAD Coordinator. Next dressing change 05/11/23.         Labs:  LDH trend: 275>251>260>247>251   Hgb trend: 9.9>8>8.1>8>8.3   INR trend: 1.3>1.4>2.0>2.6>2.1   Anticoagulation Plan: -INR Goal: 2.0-2.5 -ASA Dose: 81 mg stop date of 06/02/23   Blood Products:  Intra-op- 6 FFP 1 PLT 2 CYRO   05/05/23>>1 u/PRBC   Device: -Medtronic BiV -Therapies: OFF   Arrythmias:    Respiratory: Extubated 05/04/23   Infection:    Renal:  -BUN/CRT: 16/1.42>21/1.14>15/1.04>11/1.05>14/.87  Gtts: Milrinone 0.125 mcg/kg/min-off Epi 4 mcg/min-off  VAD Education: Wife performed dressing X1 at bedside with verbal cues from this VAD Coordinator.  2. D/c teaching scheduled for tomorrow 05/11/23. Adverse Events on VAD:   Plan/Recommendations:  1. Page VAD  coordinators for any equipment or drive line issues 2. Daily drive line dressing changes per nurse champion or VAD Coordinator   Carlton Adam RN, BSN VAD Coordinator 24/7 Pager [phone]

## 2023-05-11 DIAGNOSIS — I255 Ischemic cardiomyopathy: Secondary | ICD-10-CM | POA: Diagnosis not present

## 2023-05-11 DIAGNOSIS — I509 Heart failure, unspecified: Secondary | ICD-10-CM | POA: Diagnosis not present

## 2023-05-11 DIAGNOSIS — I5023 Acute on chronic systolic (congestive) heart failure: Secondary | ICD-10-CM | POA: Diagnosis not present

## 2023-05-11 LAB — BASIC METABOLIC PANEL
Anion gap: 12 (ref 5–15)
Anion gap: 10 (ref 5–15)
BUN: 14 mg/dL (ref 6–20)
BUN: 17 mg/dL (ref 6–20)
CO2: 28 mmol/L (ref 22–32)
CO2: 30 mmol/L (ref 22–32)
Calcium: 7.9 mg/dL — ABNORMAL LOW (ref 8.9–10.3)
Calcium: 8 mg/dL — ABNORMAL LOW (ref 8.9–10.3)
Chloride: 91 mmol/L — ABNORMAL LOW (ref 98–111)
Chloride: 92 mmol/L — ABNORMAL LOW (ref 98–111)
Creatinine, Ser: 1.13 mg/dL (ref 0.61–1.24)
Creatinine, Ser: 1.12 mg/dL (ref 0.61–1.24)
GFR, Estimated: >60 mL/min (ref >60)
GFR, Estimated: >60 mL/min (ref >60)
Glucose, Bld: 114 mg/dL — ABNORMAL HIGH (ref 70–99)
Glucose, Bld: 209 mg/dL — ABNORMAL HIGH (ref 70–99)
Potassium: 4 mmol/L (ref 3.5–5.1)
Potassium: 3.9 mmol/L (ref 3.5–5.1)
Sodium: 131 mmol/L — ABNORMAL LOW (ref 135–145)
Sodium: 132 mmol/L — ABNORMAL LOW (ref 135–145)

## 2023-05-11 LAB — CBC
HCT: 26.8 % — ABNORMAL LOW (ref 39.0–52.0)
Hemoglobin: 8.5 g/dL — ABNORMAL LOW (ref 13.0–17.0)
MCH: 31.1 pg (ref 26.0–34.0)
MCHC: 31.7 g/dL (ref 30.0–36.0)
MCV: 98.2 fL (ref 80.0–100.0)
Platelets: 262 10*3/uL (ref 150–400)
RBC: 2.73 MIL/uL — ABNORMAL LOW (ref 4.22–5.81)
RDW: 17.6 % — ABNORMAL HIGH (ref 11.5–15.5)
WBC: 10.1 10*3/uL (ref 4.0–10.5)
nRBC: 0.4 % — ABNORMAL HIGH (ref 0.0–0.2)

## 2023-05-11 LAB — LACTATE DEHYDROGENASE: LDH: 238 U/L — ABNORMAL HIGH (ref 98–192)

## 2023-05-11 LAB — COOXEMETRY PANEL
Carboxyhemoglobin: 2.1 % — ABNORMAL HIGH (ref 0.5–1.5)
Methemoglobin: <0.7 % (ref 0.0–1.5)
O2 Saturation: 59 %
Total hemoglobin: 9.1 g/dL — ABNORMAL LOW (ref 12.0–16.0)

## 2023-05-11 LAB — PROTIME-INR
INR: 2 — ABNORMAL HIGH (ref 0.8–1.2)
Prothrombin Time: 23 s — ABNORMAL HIGH (ref 11.4–15.2)

## 2023-05-11 LAB — GLUCOSE, CAPILLARY
Glucose-Capillary: 83 mg/dL (ref 70–99)
Glucose-Capillary: 193 mg/dL — ABNORMAL HIGH (ref 70–99)
Glucose-Capillary: 104 mg/dL — ABNORMAL HIGH (ref 70–99)
Glucose-Capillary: 133 mg/dL — ABNORMAL HIGH (ref 70–99)

## 2023-05-11 LAB — MAGNESIUM: Magnesium: 2.1 mg/dL (ref 1.7–2.4)

## 2023-05-11 MED ORDER — FUROSEMIDE 10 MG/ML IJ SOLN
80.0000 mg | Freq: Two times a day (BID) | INTRAMUSCULAR | Status: DC
Start: 2023-05-11 — End: 2023-05-11

## 2023-05-11 MED ORDER — POTASSIUM CHLORIDE CRYS ER 20 MEQ PO TBCR
40.0000 meq | EXTENDED_RELEASE_TABLET | Freq: Once | ORAL | Status: AC
  Administered 2023-05-11: 40 meq via ORAL
  Filled 2023-05-11: qty 2

## 2023-05-11 MED ORDER — FUROSEMIDE 10 MG/ML IJ SOLN
80.0000 mg | Freq: Two times a day (BID) | INTRAMUSCULAR | Status: DC
  Administered 2023-05-11 (×2): 80 mg via INTRAVENOUS
  Filled 2023-05-11 (×3): qty 8

## 2023-05-11 MED ORDER — WARFARIN SODIUM 2.5 MG PO TABS
2.5000 mg | ORAL_TABLET | Freq: Once | ORAL | Status: AC
  Administered 2023-05-11: 2.5 mg via ORAL
  Filled 2023-05-11: qty 1

## 2023-05-11 NOTE — Progress Notes (Signed)
Inpatient Rehab Admissions Coordinator:   Continue to follow for CIR.  Note ongoing assist needed to transition OOB.  Pending repeat ECHO.  Lasix increased to 80 BID.  I updated pt at bedside.  We will f/u on Monday.   Estill Dooms, PT, DPT Admissions Coordinator [phone] 05/11/23  10:36 AM

## 2023-05-11 NOTE — TOC Progression Note (Signed)
Transition of Care [facility]) - Progression Note    Patient Details  Name: Joe [name] MRN: [id number] Date of Birth: Jun 20, 1970  Transition of Care [facility]) CM/SW Contact  Elliot Cousin, RN Phone Number: [phone] 05/11/2023, 5:02 PM  Clinical Narrative:    CM spoke to pt and wife at bedside. Wife states he will need a bedside commode for home. Waiting as pt progresses, plan is IP rehab vs HH.  Will continue to follow for dc needs.     Expected Discharge Plan: IP Rehab Facility Barriers to Discharge: Continued Medical Work up  Expected Discharge Plan and Services   Discharge Planning Services: CM Consult Post Acute Care Choice: IP Rehab Living arrangements for the past 2 months: Single Family Home                                       Social Determinants of Health (SDOH) Interventions SDOH Screenings   Food Insecurity: No Food Insecurity (04/30/2023)  Housing: Low Risk  (04/30/2023)  Transportation Needs: No Transportation Needs (04/30/2023)  Utilities: Not At Risk (04/30/2023)  Financial Resource Strain: Low Risk  (04/20/2023)  Social Connections: Unknown (10/28/2021)   Received from [facility]  Tobacco Use: Low Risk  (05/03/2023)    Readmission Risk Interventions     No data to display

## 2023-05-11 NOTE — Plan of Care (Signed)
  Problem: Cardiovascular: Goal: Ability to achieve and maintain adequate cardiovascular perfusion will improve Outcome: Progressing Goal: Vascular access site(s) Level 0-1 will be maintained Outcome: Progressing   Problem: Clinical Measurements: Goal: Ability to maintain clinical measurements within normal limits will improve Outcome: Progressing Goal: Will remain free from infection Outcome: Progressing Goal: Diagnostic test results will improve Outcome: Progressing Goal: Respiratory complications will improve Outcome: Progressing Goal: Cardiovascular complication will be avoided Outcome: Progressing   Problem: Activity: Goal: Risk for activity intolerance will decrease Outcome: Progressing   Problem: Coping: Goal: Level of anxiety will decrease Outcome: Progressing   Problem: Elimination: Goal: Will not experience complications related to bowel motility Outcome: Progressing Goal: Will not experience complications related to urinary retention Outcome: Progressing   Problem: Pain Management: Goal: General experience of comfort will improve Outcome: Progressing   Problem: Safety: Goal: Ability to remain free from injury will improve Outcome: Progressing   Problem: Skin Integrity: Goal: Risk for impaired skin integrity will decrease Outcome: Progressing

## 2023-05-11 NOTE — Progress Notes (Signed)
PHARMACY - ANTICOAGULATION CONSULT NOTE  Pharmacy Consult for warfarin Indication: LVAD HM3 implanted 11/14  No Known Allergies  Patient Measurements: Height: 6\' 3"  (190.5 cm) Weight: (!) 138 kg (304 lb 3.8 oz) IBW/kg (Calculated) : 84.5   Vital Signs: Temp: 98.7 F (37.1 C) (11/22 0400) Temp Source: Oral (11/22 0400) BP: 104/63 (11/22 0612) Pulse Rate: 80 (11/22 0500)  Labs: Recent Labs    05/09/23 0414 05/09/23 1334 05/10/23 0444 05/10/23 1626 05/11/23 0352  HGB 8.0*  --  8.3*  --  8.5*  HCT 24.3*  --  25.5*  --  26.8*  PLT 183  --  231  --  262  APTT 42*  --   --   --   --   LABPROT 28.4*  --  23.5*  --  23.0*  INR 2.6*  --  2.1*  --  2.0*  CREATININE 1.05   < > 0.92 0.87 1.13   < > = values in this interval not displayed.    Estimated Creatinine Clearance: 114.5 mL/min (by C-G formula based on SCr of 1.13 mg/dL).   Medical History: Past Medical History:  Diagnosis Date   Abnormal EKG    Acute systolic heart failure (HCC) 09/06/2017   Angina pectoris (HCC) 09/05/2017   Body mass index 45.0-49.9, adult (HCC)    CAD S/P percutaneous coronary angioplasty 09/07/2017   mLAD and dLAD PCI with DES 09/06/17   CHF (congestive heart failure) (HCC)    Chronic systolic (congestive) heart failure (HCC) 03/19/2019   Complication of anesthesia 1995   "had hard time waking up; breathing w/vasectomy"   Coronary artery disease    Erectile dysfunction    Essential hypertension, benign    Fatigue    GERD (gastroesophageal reflux disease)    Gout    High cholesterol    "just started tx yesterday" (09/06/2017)   History of gout    "RX prn" (09/06/2017)   Ischemic cardiomyopathy 09/07/2017   EF 25-35%   Muscular chest pain    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   Below Left Nare (MOH's)   Obesity    Obstructive sleep apnea    OSA on CPAP    Plantar fasciitis    Pre-operative clearance 02/11/2018   Presence of cardiac resynchronization therapy defibrillator  (CRT-D) 04/02/2019   [employer] Jude   Right bundle branch block    Shortness of breath 09/05/2017   Testosterone deficiency    Type 2 diabetes mellitus with complication, without long-term current use of insulin (HCC) 09/05/2017   Type II diabetes mellitus (HCC)    "started tx 08/28/2017"    Assessment: 52yom with heart failure s/p LVAD implant HM3 11/14. INR slightly elevated at baseline (1.6).   INR 2 is therapeutic after restarting lower dose 11/21. Hgb 8.5, plt 262, LDH 238. No s/sx of bleeding. Patient has good oral intake- eating most meals. No new DDIs noted.   Goal of Therapy:  INR 2-2.5 Monitor platelets by anticoagulation protocol: Yes   Plan:  Order warfarin 2.5 mg x1 tonight Daily PT/INR. Monitor s/s bleeding   Thank you for allowing pharmacy to participate in this patient's care,  Sherron Monday, PharmD, BCCCP Clinical Pharmacist  Phone: [phone] 05/11/2023 8:16 AM  Please check AMION for all [facility] Pharmacy phone numbers After 10:00 PM, call Main Pharmacy [phone]

## 2023-05-11 NOTE — Progress Notes (Signed)
OT Cancellation Note  Patient Details Name: Joe [name] MRN: [id number] DOB: December 08, 1970   Cancelled Treatment:    Reason Eval/Treat Not Completed:  (Pt receiving LVAD education. Will continue to follow.)  Evern Bio 05/11/2023, 2:46 PM Berna Spare, OTR/L Acute Rehabilitation Services Office: [phone]

## 2023-05-11 NOTE — Progress Notes (Signed)
VAD Discharge Teaching Note:  Discharge VAD teaching completed with pt and his wife Lenore.  The home inspection checklist has been reviewed and no unsafe conditions have been identified. Family reports that there are at least two dedicated grounded, 3-prong outlets with clearly labeled circuit breaker has been established in the bedroom for power module and Magazine features editor.   Both patient and caregiver have been trained on the following:  1. HM II LVAD overview of system operations  2. Overview of major lifestyle accommodations and cautions   3. Overview of system components (features and functions) 4. Changing power sources 5. Overview of alerts and alarms 6. How to identify and manage an emergency including when pump is running and when pump has stopped  7. Changing system controller 8. Maintain emergency contact list and medications  The patient and caregiver have successfully demonstrated:  1. Changing power source (from batteries to mobile power unit, mobile power unit to batteries, and replacing batteries) 2. Perform system controller self test  3. Check and charge batteries  4. Change system controller 5. Paged VAD pager and programmed number in phones  A daily flow sheet with patient  weight, temperature,  flow, speed, power, and PI, along with daily self checks on system controller and power module have been performed by patient and caregiver during hospitalization and will also be done daily at home.   The caregiver has been trained on percutaneous lead exit site care, care of the driveline and dressing changes. She/he has performed dressing changes during patient's hospitalization under my supervision with the support of the nursing staff. The importance of lead immobilization has been stressed to patient and caregiver using the attachment device. The caregiver has successfully demonstrated the following: 1. Cleansing site with sterile technique 2. Dressing care and  maintenance  3. Immobilizing driveline  The following routine activities and maintenance have been reviewed with patient and caregiver and both verbalize understanding:  1. Stressed importance of never disconnecting power from both controller power leads at the same time, and never disconnecting both batteries at the same time, or the pump will alarm and eventually stop if no power supply restored.  2. Plug the mobile power unit (MPU) and the universal battery charger (UBC) into properly grounded (3 prong) outlets dedicated to PM use. Do NOT use adapter (cheater plug) for ungrounded outlets or multiple portable socket outlets (power strips) 3. Do not connect the PM or MPU to an outlet controlled by wall switch or the device may not work 4. Transfer from MPU to batteries during [facility] mains power failure. The PM has internal backup battery that will power the pump while you transfer to batteries 5. Keep a backup system controller, charged batteries, battery clips, and flashlight near you during sleep in case of electrical power outage 6. Clean battery, battery clip, and universal battery charger contacts weekly 7. Visually inspect percutaneous lead daily 8. Check cables and connectors when changing power source  9. Rotate batteries; keep all eight batteries charged 10. Always have backup system controller, battery clips, fully charged batteries, and spare fully charged batteries when traveling 11. Re-calibrate batteries every 70 uses; monitor battery life of 36 months or 360 uses; replace batteries at end of battery life   Identified the following changes in activities of daily living with pump:  1. No driving for at least six weeks and then only if doctor gives permission to do so 2. No tub baths while pump implanted, and shower only if doctor gives  permission 3. No swimming or submersion in water while implanted with pump 4. Keep all VAD equipment away from water or moisture 5. Keep all VAD  connections clean and dry 6. No contact sports or engage in jumping activities 7. Never have an MRI while implanted with the pump 8. Never leave or store batteries in extremely hot or cold places (such as   trunk of your car), or the battery life will be shortened 9. Call the doctor or hospital contact person if any change in how the pump sounds, feels, or works 10. Plan to sleep only when connected to the mobile power unit. Marland Kitchen 11. Keep a backup system controller, charged batteries, battery clips, and flashlight near you during sleep in case of electrical power outage 12. Do not sleep on your stomach 13. Talk with doctor before any long distance travel plans 14. Patient will need antibiotics prior to any dental procedure; instructed to contact VAD coordinator before any dental procedures (including routine cleaning)   Discharge binder given to patient and include the following: 1. List of emergency contacts 2. Wallet card 3. HM III Luggage tags 4. HM III Alarms for Patients and their Caregivers 5. HM III Patient Handbook 6. HM III Patient Education Program DVD 7. Daily diary sheets 8. Warfarin teaching sheets 9. Nosebleed teaching sheets 10. Medications you may and may not take with CHF list  Discharge equipment includes:  1. Two system controllers 2. One mobile power unit with attached 21 ' patient cable 3. One universal Magazine features editor (UBC) 4. Eight fully charged batteries  5. Four battery clips 6. One travel case 7. One holster vest 8. Wearable accessory package 9. Daily dressing kits and anchors  Following notification process will be completed at day of discharge: [facility] [address] Lonie Peak [place] PCP  Discussed frequency and importance of INR checks; emphasized importance of maintaining INR goal to prevent clotting and or bleeding issues with pump.  Patient able to answer questions and asked good questions pertaining to warfarin and diet/lifestyle  changes necessary to be successful and safe.  Patient will have INR managed by VAD Clinic; current INR goal is 2.0 - 2.5.    The patient has completed a proficiency test for the HM III and all questions have been answered. The pt and family have been instructed to call if any questions, problems, or concerns arise. Pt and caregiver successfully paged VAD coordinator using VAD pager emergency number and have been instructed to use this number only for emergencies. Patient and caregiver asked appropriate questions, had good interaction with VAD coordinator, and verbalized understanding of above instructions.   Pt discharging to his house per original plan. His caregivers plan to stay with him/her for the first 2 weeks, and as needed after initial 2- week period.  Caregiver will plan to change drive line dressing daily. Pt verbalized agreement with this plan.   Simmie Davies RN,BSN VAD Coordinator  Office: [phone] 24/7 VAD Pager: [phone]

## 2023-05-11 NOTE — Progress Notes (Signed)
LVAD Coordinator Rounding Note:   Admitted 04/30/23 due to for optimization prior to VAD.    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   Pt lying in bed this afternoon on my arrival. States he had a rough night. He did not sleep well due to a coughing fit that caused increased incisional pain. Given Tramdol for pain relief which caused him to have dizziness and hallucinations. Pt states he is feeling better this afternoon.  Plan to transfer to [facility] today.  VAD Education completed today with pt and wife Lenore at bedside. Please see separate note for details. Home equipment has been ordered.    Vital signs: Temp: 98.4 HR: 80 Doppler Pressure: 80 Automatic BP: 91/64 (74) O2 Sat: 100% RA Wt:316.4>305.5>317.9>315.7>313>304.2lbs     LVAD interrogation reveals:  Speed: 6100 Flow: 6.2 Power: 5.0w PI: 1.9 Alarms: none Events: 30 PI events Hematocrit: 25 Fixed speed: 6200 Low speed limit: 5900   Drive Line: Existing VAD dressing removed and site care performed using sterile technique by wife Lenore observered by VAD coordinator. Drive line exit site cleaned with Chlora prep applicators x 2, rinsed with sterile saline and allowed to dry. Silver strip applied flush with exit site. Exit site is unincorporated, the velour is fully implanted at exit site with 1 suture in place. Moderate amount dried bloody drainage. No redness, tenderness, foul odor or rash noted. Drive line anchor re-applied. Continue daily dressing changes by nurse champion or VAD Coordinator. Next dressing change 05/12/23.          Labs:  LDH trend: 275>251>260>247>251>238   Hgb trend: 9.9>8>8.1>8>8.3>8.5   INR trend: 1.3>1.4>2.0>2.6>2.1>2.0   Anticoagulation Plan: -INR Goal: 2.0-2.5 -ASA Dose: 81 mg stop date of 06/02/23   Blood Products:  Intra-op- 6 FFP 1 PLT 2 CYRO   05/05/23>>1 u/PRBC   Device: -Medtronic BiV -Therapies: OFF   Arrythmias:     Respiratory: Extubated 05/04/23   Infection:    Renal:  -BUN/CRT: 16/1.42>21/1.14>15/1.04>11/1.05>14/.87>14/1.13   Gtts: Milrinone 0.125 mcg/kg/min-off Epi 4 mcg/min-off   VAD Education: Discharge teaching complete this afternoon. See separate note for details  Adverse Events on VAD:   Plan/Recommendations:  1. Page VAD coordinators for any equipment or drive line issues 2. Daily drive line dressing changes per nurse champion or VAD Coordinator   Simmie Davies RN, BSN VAD Coordinator 24/7 Pager [phone]

## 2023-05-11 NOTE — Progress Notes (Signed)
H&V Care Navigation CSW Progress Note  Outpatient Heart Failure CSW following patient during admission for LVAD implant.  Met with patient at bedside- no family present at this time.  Patient reports he is doing well overall though he is not sure he can tell a difference in how he feels yet.  Is working well with PT and adjusting to having driveline and learning about equipment.  Has no concerns about his new equipment other than slightly inconvenient placement of the driveline that he is getting used to.  Had a rough night last night due to poor reaction to medication and hopeful he will feel better tomorrow after getting some rest.  Hopeful to move to stepdown unit soon so he can have more visitors including his grandchildren who he would love to see- staff hopeful he can transfer today.  Reports no needs at this time.  Family will be visiting this afternoon for LVAD training.   SDOH Screenings   Food Insecurity: No Food Insecurity (04/30/2023)  Housing: Low Risk  (04/30/2023)  Transportation Needs: No Transportation Needs (04/30/2023)  Utilities: Not At Risk (04/30/2023)  Financial Resource Strain: Low Risk  (04/20/2023)  Social Connections: Unknown (10/28/2021)   Received from [facility]  Tobacco Use: Low Risk  (05/03/2023)   Burna Sis, LCSW Clinical Social Worker [facility] Desk#: [phone] Cell#: [phone]

## 2023-05-11 NOTE — Progress Notes (Addendum)
Advanced Heart Failure VAD Team Note  PCP-Cardiologist: [name], MD   Subjective:    11/14: s/p HM3 LVAD implantation.  Post op day #8  Off Epi and Milrinone. Co-ox 59. 2.5L UOP on Lasix 40mg  IV BID. Increase today. INR now 2. Warfarin per PharmD dosing. Echo 11/21: ?RV function not well visualized  Productive cough overnight, causing incisional pain, took Tramadol. He stated that it made him feel dizzy and hallucinate, so did not sleep well overnight, still feels foggy. Sat up on the side of bed for breakfast.   LVAD INTERROGATION:  HeartMate HM3 LVAD: Flow 5.9, Speed 6200, PI 3.7, Power 5.3, Numerous PI events overnight and this am with multiple speed drops noted and PI <2.     Objective:    Vital Signs:   Temp:  [97.9 F (36.6 C)-99.1 F (37.3 C)] 98.7 F (37.1 C) (11/22 0400) Pulse Rate:  [77-174] 80 (11/22 0500) Resp:  [14-25] 23 (11/22 0612) BP: (58-108)/(36-86) 104/63 (11/22 0612) SpO2:  [81 %-99 %] 87 % (11/22 0500) Weight:  [161 kg] 138 kg (11/22 0500) Last BM Date : 05/10/23  Mean arterial Pressure 80-90s  Intake/Output:   Intake/Output Summary (Last 24 hours) at 05/11/2023 0803 Last data filed at 05/11/2023 0400 Gross per 24 hour  Intake 39.93 ml  Output 2000 ml  Net -1960.07 ml   Physical Exam    CVP 14-15 General: Drowsy. No distress on RA HEENT: neck supple.   Cardiac: JVP to jaw. Mechanical heart sounds with LVAD hum present.  Lung sounds: fine crackles in bases Abdomen: Soft, non-tender, non-distended. + BS. Driveline: Dressing C/D/I. No drainage or redness. Anchor in place. Extremities: Warm and dry. No rash, cyanosis, or edema. RUE PICC Neuro: Alert and oriented x3. Drowsy. Moves all extremities without difficulty.  Telemetry   AV paced 80 (personally reviewed)  Labs   Basic Metabolic Panel: Recent Labs  Lab 05/06/23 0414 05/07/23 0400 05/08/23 0450 05/09/23 0414 05/09/23 1334 05/10/23 0444 05/10/23 1626  05/11/23 0352  NA 135 137 134* 134* 135 132* 133* 131*  K 4.3 3.3* 3.5 2.9* 3.1* 3.5 3.8 4.0  CL 100 98 95* 93* 92* 91* 92* 91*  CO2 27 30 29  33* 33* 34* 32 28  GLUCOSE 198* 165* 127* 120* 114* 89 89 114*  BUN 18 21* 15 11 9 12 14 14   CREATININE 0.99 1.14 1.04 1.05 0.88 0.92 0.87 1.13  CALCIUM 8.8* 8.1* 8.6* 8.3* 8.1* 8.1* 8.3* 7.9*  MG 2.2 1.9 2.0 1.9  --  2.1  --  2.1  PHOS 2.5 3.2 3.1 3.7  --  4.0  --   --     Liver Function Tests: Recent Labs  Lab 05/05/23 0438 05/06/23 0414 05/08/23 0450  AST 43* 34 44*  ALT 16 17 21   ALKPHOS 45 53 81  BILITOT 2.4* 2.2* 1.3*  PROT 5.7* 5.9* 5.8*  ALBUMIN 3.1* 3.0* 2.7*   No results for input(s): "LIPASE", "AMYLASE" in the last 168 hours. No results for input(s): "AMMONIA" in the last 168 hours.  CBC: Recent Labs  Lab 05/06/23 0414 05/07/23 0400 05/08/23 0450 05/09/23 0414 05/10/23 0444 05/11/23 0352  WBC 16.3* 13.2* 11.3* 13.2* 9.3 10.1  NEUTROABS 13.6* 10.2* 8.5* 10.7* 6.9  --   HGB 8.3* 8.0* 8.1* 8.0* 8.3* 8.5*  HCT 25.8* 24.3* 24.4* 24.3* 25.5* 26.8*  MCV 97.0 98.0 98.0 97.2 97.3 98.2  PLT 149* 160 188 183 231 262    INR: Recent Labs  Lab 05/07/23  0400 05/08/23 0450 05/09/23 0414 05/10/23 0444 05/11/23 0352  INR 1.4* 2.0* 2.6* 2.1* 2.0*    Other results:   Imaging   VAS [place] UPPER EXTREMITY VENOUS DUPLEX  Result Date: 05/10/2023 UPPER VENOUS STUDY  Patient Name:  JULIA DEC [facility]  Date of Exam:   05/10/2023 Medical Rec #: [id number]               Accession #:    [id number] Date of Birth: 03-30-1971                Patient Gender: M Patient Age:   34 years Exam Location:  [facility] Procedure:      VAS [place] UPPER EXTREMITY VENOUS DUPLEX Referring Phys: [place] LEE --------------------------------------------------------------------------------  Indications: Swelling Risk Factors: None identified. Limitations: Poor ultrasound/tissue interface. Comparison Study: No prior studies. Performing Technologist:  Chanda Busing RVT  Examination Guidelines: A complete evaluation includes B-mode imaging, spectral Doppler, color Doppler, and power Doppler as needed of all accessible portions of each vessel. Bilateral testing is considered an integral part of a complete examination. Limited examinations for reoccurring indications may be performed as noted.  Right Findings: +----------+------------+---------+-----------+----------+-------+ RIGHT     CompressiblePhasicitySpontaneousPropertiesSummary +----------+------------+---------+-----------+----------+-------+ Subclavian    Full       Yes       Yes                      +----------+------------+---------+-----------+----------+-------+  Left Findings: +----------+------------+---------+-----------+----------+-------+ LEFT      CompressiblePhasicitySpontaneousPropertiesSummary +----------+------------+---------+-----------+----------+-------+ IJV           Full       Yes       Yes                      +----------+------------+---------+-----------+----------+-------+ Subclavian    Full       Yes       Yes                      +----------+------------+---------+-----------+----------+-------+ Axillary      Full       Yes       Yes                      +----------+------------+---------+-----------+----------+-------+ Brachial      Full                                          +----------+------------+---------+-----------+----------+-------+ Radial        Full                                          +----------+------------+---------+-----------+----------+-------+ Ulnar         Full                                          +----------+------------+---------+-----------+----------+-------+ Cephalic      Full                                          +----------+------------+---------+-----------+----------+-------+ Basilic  Full                                           +----------+------------+---------+-----------+----------+-------+  Summary:  Right: No evidence of thrombosis in the subclavian.  Left: No evidence of deep vein thrombosis in the upper extremity. No evidence of superficial vein thrombosis in the upper extremity.  *See table(s) above for measurements and observations.  Diagnosing physician: Carolynn Sayers Electronically signed by Carolynn Sayers on 05/10/2023 at 2:34:52 PM.    Final    ECHOCARDIOGRAM LIMITED  Result Date: 05/10/2023    ECHOCARDIOGRAM LIMITED REPORT   Patient Name:   Aarush Sahagian Date of Exam: 05/10/2023 Medical Rec #:  [id number]              Height:       75.0 in Accession #:    [id number]             Weight:       313.1 lb Date of Birth:  02-23-1971               BSA:          2.655 m Patient Age:    52 years               BP:           107/85 mmHg Patient Gender: M                      HR:           82 bpm. Exam Location:  Inpatient Procedure: Limited Echo, Color Doppler and Cardiac Doppler Indications:    I50.21 Acute systolic (congestive) heart failure  History:        Patient has prior history of Echocardiogram examinations, most                 recent 05/07/2023.  Sonographer:    Vern Claude Referring Phys: Benay Spice [facility]  Sonographer Comments: Technically difficult study due to poor echo windows. IMPRESSIONS  1. Left ventricular endocardial border not optimally defined to evaluate regional wall motion.  2. Right ventricular systolic function was not well visualized.  3. The mitral valve was not well visualized.  4. The aortic valve was not well visualized.  5. Very poor windows with essentially inadequate windows in all views to comment on structure. FINDINGS  Left Ventricle: Left ventricular endocardial border not optimally defined to evaluate regional wall motion. Right Ventricle: Right ventricular systolic function was not well visualized. Left Atrium: Left atrial size was not well visualized. Right Atrium: Right atrial  size was not well visualized. Mitral Valve: The mitral valve was not well visualized. Tricuspid Valve: The tricuspid valve is not well visualized. Aortic Valve: The aortic valve was not well visualized. Pulmonic Valve: The pulmonic valve was not assessed. Aorta: Aortic root could not be assessed. Venous: The inferior vena cava was not well visualized. Additional Comments: Spectral Doppler performed. Color Doppler performed.  RIGHT VENTRICLE RV S prime:     5.44 cm/s TAPSE (M-mode): 1.2 cm Armanda Magic MD Electronically signed by Armanda Magic MD Signature Date/Time: 05/10/2023/11:51:04 AM    Final    DG Chest Port 1 View  Result Date: 05/10/2023 CLINICAL DATA:  Left ventricular assist device. EXAM: PORTABLE CHEST 1 VIEW COMPARISON:  May 09, 2023. FINDINGS: Stable cardiomegaly. Left-sided pacemaker is unchanged.  Right-sided PICC line is unchanged. Left ventricular assist device is unchanged. Right lung is clear. Stable left basilar atelectasis and effusion is noted. IMPRESSION: No significant changes are noted. Electronically Signed   By: Lupita Raider M.D.   On: 05/10/2023 11:26    Medications:    Scheduled Medications:  allopurinol  100 mg Oral Daily   amiodarone  200 mg Oral BID   aspirin EC  81 mg Oral Daily   bisacodyl  10 mg Oral Daily   Or   bisacodyl  10 mg Rectal Daily   Chlorhexidine Gluconate Cloth  6 each Topical Daily   escitalopram  10 mg Oral QHS   ezetimibe  10 mg Oral Daily   Fe Fum-Vit C-Vit B12-FA  1 capsule Oral QPC breakfast   furosemide  40 mg Intravenous BID   gabapentin  400 mg Oral TID   insulin aspart  0-24 Units Subcutaneous TID WC & HS   insulin detemir  20 Units Subcutaneous BID   levothyroxine  25 mcg Oral Daily   mexiletine  250 mg Oral Q12H   rosuvastatin  40 mg Oral Daily   sodium chloride flush  10-40 mL Intracatheter Q12H   sodium chloride flush  3 mL Intravenous Q12H   spironolactone  12.5 mg Oral Daily   Warfarin - Pharmacist Dosing Inpatient    Does not apply q1600    Infusions:    PRN Medications: acetaminophen, antiseptic oral rinse, dextrose, ondansetron (ZOFRAN) IV, mouth rinse, oxyCODONE, polyvinyl alcohol, sodium chloride flush, traMADol  Patient Profile   Joe [name] is a 52 y.o. male with h/o morbid obesity, DM2, HTN, HL, OSA, CAD and systolic HF.  Now s/p HM3 LVAD Insertion 11/14 by Dr. Donata Clay.  Assessment/Plan:    1.  Chronic Systolic HF s/p HM3 Insertion 16/10 - Etiology: iCM - Echo (8/24): EF 20-25%, RV ok. - Echo 05/01/23: EF 20%, RV moderately down (improved) - R/LHC (9/24): stable 3v CAD, elevated biventricular filling pressures with low PAPi; CI 2.2 - RHC (11/24): RA 9, PA mean 28, PCWP 19, Fick CI 1.9, TD CI 2.2, PVR 1.6, PAPi 2.4 - S/p HM III LVAD 05/03/23 by Dr. Maren Beach - Received FFP x6, Cryo x2, PLT x1, and DDAVP in OR - Intra-op TEE - EF 20-25%, RV moderately reduced, IV septum with mild rightward bowing, AV opens intermittently - Limited Echo 11/18: LV decompressed, RV appeared dilated but difficult windows.  - Limited Echo 11/21: ?RV function not well visualized - Of Milrinone and NE. Co-ox 59. - CVP now 14-15 with JVP to jaw and PI events, concerned about RV and hypervolemia. Increase Lasix to 80 IV BID - Discussed warfarin dosing with PharmD. INR 2.0  - Given limited views of RV, re-peat echo  2. H/o VT Arrest  Hx PVCs - Followed by Dr. Elberta Fortis - Admitted 10/10-10/13/24 w/ VF treated w/ ICD shock>>loaded w/ IV amio  - Re-admitted 04/08/23 w/ VT arrest, treated w/ appropriate ICD shock x 2 + brief bystander CPR  - in setting of underling CM w/ EF < 35% + intermittent hypokalemia  - Continue Amio 200 mg bid and Mexiletine 250 mg bid   3. CAD with chronic stable angina - s/p multiple stents to LAD and LCX - Cath 9/22 stable CAD - LHC (9/24) with stable 3v CAD - Continue ASA + Crestor 40mg  daily  4. AKI  - Now at baseline (sCr 1.01) - Keep off SGLT2i in acute post-op - Avoid  hypotension - Follow  BMET   5. DM2 - A1c 7.2 (1/24) - SSI   6. OSA - Not using CPAP, says machine was recalled a while ago. Needs new machine. - Followed outpatient   7. Hypokalemia - K 4.0  Length of Stay: 56  [place] Lee, NP 05/11/2023, 8:03 AM  VAD Team --- VAD ISSUES ONLY--- Pager [phone] (7am - 7am)  Advanced Heart Failure Team  Pager [phone] (M-F; 7a - 5p)  Please contact [facility] for night-coverage after hours (5p -7a ) and weekends on [email or web address]  Agree with above.   Remains on low-dose NE and milrinone. Co-ox 71% Still with cough Able to ambulate   General:  Sitting up in bed NAD.  HEENT: normal  Neck: supple. JVP not elevated.  Carotids 2+ bilat; no bruits. No lymphadenopathy or thryomegaly appreciated. Cor: LVAD hum.  Lungs: Clear. Abdomen: obese soft, nontender, non-distended. No hepatosplenomegaly. No bruits or masses. Good bowel sounds. Driveline site clean. Anchor in place.  Extremities: no cyanosis, clubbing, rash. Warm no edema  Neuro: alert & oriented x 3. No focal deficits. Moves all 4 without problem    Making steady progress. Stop milrinone. Wean NE.    Given low PI on VAD, I dropped VAD speed to 6100.    Will need Ramp echo on Monday.    CRITICAL CARE Performed by: Arvilla Meres   Total critical care time: 40 minutes   Critical care time was exclusive of separately billable procedures and treating other patients.   Critical care was necessary to treat or prevent imminent or life-threatening deterioration.   Critical care was time spent personally by me (independent of midlevel providers or residents) on the following activities: development of treatment plan with patient and/or surrogate as well as nursing, discussions with consultants, evaluation of patient's response to treatment, examination of patient, obtaining history from patient or surrogate, ordering and performing treatments and interventions, ordering and review of  laboratory studies, ordering and review of radiographic studies, pulse oximetry and re-evaluation of patient's condition.     Arvilla Meres, MD  2:44 PM

## 2023-05-11 NOTE — Progress Notes (Signed)
ProgressHeartMate 3 Rounding Note Status post implantation of HeartMate 3 May 03, 2023  Subjective:   Postop day 8 HeartMate 3 implant  Patient is now all lines out except for PICC line. A.m. coox is 59%.  Patient took some tramadol for incisional pain associated with coughing and had hallucinations and poor sleeping pattern last night  Currently sitting up in bed, sinus rhythm feeling somewhat fatigued.  Weight is down to 304 pounds  Speed remains at 6200 RPM with VAD flows  6 L/min INR now 2.0, orders per pharmacy   Exit site of the driveline is clean and dry. LVAD INTERROGATION:  HeartMate II LVAD:  Flow 6.2liters/min, speed 6200, power 4.2, PI 1.1.  Controller intact.   Objective:    Vital Signs:   Temp:  [97.9 F (36.6 C)-99.1 F (37.3 C)] 98.7 F (37.1 C) (11/22 0400) Pulse Rate:  [79-145] 83 (11/22 0700) Resp:  [13-25] 13 (11/22 0900) BP: (58-108)/(36-81) 104/63 (11/22 0612) SpO2:  [87 %-99 %] 90 % (11/22 0700) Weight:  [086 kg] 138 kg (11/22 0500) Last BM Date : 05/10/23 Mean arterial Pressure 80-85  Intake/Output:   Intake/Output Summary (Last 24 hours) at 05/11/2023 1053 Last data filed at 05/11/2023 0400 Gross per 24 hour  Intake 17.17 ml  Output 900 ml  Net -882.83 ml     Physical Exam: General:  Well appearing. No resp difficulty    Sternal incision dry, clean. VAD tunnel dressing dry HEENT: normal Neck: supple. JVP . Carotids no bruits. No lymphadenopathy or thryomegaly appreciated. Cor: Mechanical heart sounds with LVAD hum present. Lungs: clear Abdomen: soft, nontender, nondistended. No hepatosplenomegaly. No bruits or masses. Good bowel sounds. Extremities: no cyanosis, clubbing, rash, edema Neuro: alert & orientedx3, cranial nerves grossly intact. moves all 4 extremities w/o difficulty. Affect pleasant  Telemetry: A-V sequentially paced at 80 bpm  Labs: Basic Metabolic Panel: Recent Labs  Lab 05/06/23 0414 05/07/23 0400  05/08/23 0450 05/09/23 0414 05/09/23 1334 05/10/23 0444 05/10/23 1626 05/11/23 0352  NA 135 137 134* 134* 135 132* 133* 131*  K 4.3 3.3* 3.5 2.9* 3.1* 3.5 3.8 4.0  CL 100 98 95* 93* 92* 91* 92* 91*  CO2 27 30 29  33* 33* 34* 32 28  GLUCOSE 198* 165* 127* 120* 114* 89 89 114*  BUN 18 21* 15 11 9 12 14 14   CREATININE 0.99 1.14 1.04 1.05 0.88 0.92 0.87 1.13  CALCIUM 8.8* 8.1* 8.6* 8.3* 8.1* 8.1* 8.3* 7.9*  MG 2.2 1.9 2.0 1.9  --  2.1  --  2.1  PHOS 2.5 3.2 3.1 3.7  --  4.0  --   --     Liver Function Tests: Recent Labs  Lab 05/05/23 0438 05/06/23 0414 05/08/23 0450  AST 43* 34 44*  ALT 16 17 21   ALKPHOS 45 53 81  BILITOT 2.4* 2.2* 1.3*  PROT 5.7* 5.9* 5.8*  ALBUMIN 3.1* 3.0* 2.7*   No results for input(s): "LIPASE", "AMYLASE" in the last 168 hours. No results for input(s): "AMMONIA" in the last 168 hours.  CBC: Recent Labs  Lab 05/06/23 0414 05/07/23 0400 05/08/23 0450 05/09/23 0414 05/10/23 0444 05/11/23 0352  WBC 16.3* 13.2* 11.3* 13.2* 9.3 10.1  NEUTROABS 13.6* 10.2* 8.5* 10.7* 6.9  --   HGB 8.3* 8.0* 8.1* 8.0* 8.3* 8.5*  HCT 25.8* 24.3* 24.4* 24.3* 25.5* 26.8*  MCV 97.0 98.0 98.0 97.2 97.3 98.2  PLT 149* 160 188 183 231 262    INR: Recent Labs  Lab 05/07/23 0400  05/08/23 0450 05/09/23 0414 05/10/23 0444 05/11/23 0352  INR 1.4* 2.0* 2.6* 2.1* 2.0*    Other results: EKG:   Imaging: VAS [place] UPPER EXTREMITY VENOUS DUPLEX  Result Date: 05/10/2023 UPPER VENOUS STUDY  Patient Name:  Joe [name] [facility]  Date of Exam:   05/10/2023 Medical Rec #: [id number]               Accession #:    [id number] Date of Birth: 09-22-1970                Patient Gender: M Patient Age:   52 years Exam Location:  [facility] Procedure:      VAS [place] UPPER EXTREMITY VENOUS DUPLEX Referring Phys: [place] LEE --------------------------------------------------------------------------------  Indications: Swelling Risk Factors: None identified. Limitations: Poor  ultrasound/tissue interface. Comparison Study: No prior studies. Performing Technologist: Chanda Busing RVT  Examination Guidelines: A complete evaluation includes B-mode imaging, spectral Doppler, color Doppler, and power Doppler as needed of all accessible portions of each vessel. Bilateral testing is considered an integral part of a complete examination. Limited examinations for reoccurring indications may be performed as noted.  Right Findings: +----------+------------+---------+-----------+----------+-------+ RIGHT     CompressiblePhasicitySpontaneousPropertiesSummary +----------+------------+---------+-----------+----------+-------+ Subclavian    Full       Yes       Yes                      +----------+------------+---------+-----------+----------+-------+  Left Findings: +----------+------------+---------+-----------+----------+-------+ LEFT      CompressiblePhasicitySpontaneousPropertiesSummary +----------+------------+---------+-----------+----------+-------+ IJV           Full       Yes       Yes                      +----------+------------+---------+-----------+----------+-------+ Subclavian    Full       Yes       Yes                      +----------+------------+---------+-----------+----------+-------+ Axillary      Full       Yes       Yes                      +----------+------------+---------+-----------+----------+-------+ Brachial      Full                                          +----------+------------+---------+-----------+----------+-------+ Radial        Full                                          +----------+------------+---------+-----------+----------+-------+ Ulnar         Full                                          +----------+------------+---------+-----------+----------+-------+ Cephalic      Full                                          +----------+------------+---------+-----------+----------+-------+  Basilic       Full                                          +----------+------------+---------+-----------+----------+-------+  Summary:  Right: No evidence of thrombosis in the subclavian.  Left: No evidence of deep vein thrombosis in the upper extremity. No evidence of superficial vein thrombosis in the upper extremity.  *See table(s) above for measurements and observations.  Diagnosing physician: Carolynn Sayers Electronically signed by Carolynn Sayers on 05/10/2023 at 2:34:52 PM.    Final    ECHOCARDIOGRAM LIMITED  Result Date: 05/10/2023    ECHOCARDIOGRAM LIMITED REPORT   Patient Name:   Joe [name] Date of Exam: 05/10/2023 Medical Rec #:  [id number]              Height:       75.0 in Accession #:    [id number]             Weight:       313.1 lb Date of Birth:  [birth year]               BSA:          2.655 m Patient Age:    52 years               BP:           107/85 mmHg Patient Gender: M                      HR:           82 bpm. Exam Location:  Inpatient Procedure: Limited Echo, Color Doppler and Cardiac Doppler Indications:    I50.21 Acute systolic (congestive) heart failure  History:        Patient has prior history of Echocardiogram examinations, most                 recent 05/07/2023.  Sonographer:    Vern Claude Referring Phys: Benay Spice [facility]  Sonographer Comments: Technically difficult study due to poor echo windows. IMPRESSIONS  1. Left ventricular endocardial border not optimally defined to evaluate regional wall motion.  2. Right ventricular systolic function was not well visualized.  3. The mitral valve was not well visualized.  4. The aortic valve was not well visualized.  5. Very poor windows with essentially inadequate windows in all views to comment on structure. FINDINGS  Left Ventricle: Left ventricular endocardial border not optimally defined to evaluate regional wall motion. Right Ventricle: Right ventricular systolic function was not well visualized. Left Atrium:  Left atrial size was not well visualized. Right Atrium: Right atrial size was not well visualized. Mitral Valve: The mitral valve was not well visualized. Tricuspid Valve: The tricuspid valve is not well visualized. Aortic Valve: The aortic valve was not well visualized. Pulmonic Valve: The pulmonic valve was not assessed. Aorta: Aortic root could not be assessed. Venous: The inferior vena cava was not well visualized. Additional Comments: Spectral Doppler performed. Color Doppler performed.  RIGHT VENTRICLE RV S prime:     5.44 cm/s TAPSE (M-mode): 1.2 cm Armanda Magic MD Electronically signed by Armanda Magic MD Signature Date/Time: 05/10/2023/11:51:04 AM    Final    DG Chest Port 1 View  Result Date: 05/10/2023 CLINICAL DATA:  Left ventricular assist device. EXAM: PORTABLE CHEST 1 VIEW COMPARISON:  May 09, 2023. FINDINGS: Stable cardiomegaly. Left-sided pacemaker is unchanged. Right-sided PICC line is unchanged. Left ventricular assist device is unchanged. Right lung is clear. Stable left basilar atelectasis and effusion is noted. IMPRESSION: No significant changes are noted. Electronically Signed   By: Lupita Raider M.D.   On: 05/10/2023 11:26  Medications:     Scheduled Medications:  allopurinol  100 mg Oral Daily   amiodarone  200 mg Oral BID   aspirin EC  81 mg Oral Daily   bisacodyl  10 mg Oral Daily   Or   bisacodyl  10 mg Rectal Daily   Chlorhexidine Gluconate Cloth  6 each Topical Daily   escitalopram  10 mg Oral QHS   ezetimibe  10 mg Oral Daily   Fe Fum-Vit C-Vit B12-FA  1 capsule Oral QPC breakfast   furosemide  80 mg Intravenous BID   gabapentin  400 mg Oral TID   insulin aspart  0-24 Units Subcutaneous TID WC & HS   insulin detemir  20 Units Subcutaneous BID   levothyroxine  25 mcg Oral Daily   mexiletine  250 mg Oral Q12H   potassium chloride  40 mEq Oral Once   rosuvastatin  40 mg Oral Daily   sodium chloride flush  10-40 mL Intracatheter Q12H   sodium  chloride flush  3 mL Intravenous Q12H   spironolactone  12.5 mg Oral Daily   warfarin  2.5 mg Oral ONCE-1600   Warfarin - Pharmacist Dosing Inpatient   Does not apply q1600    Infusions:    PRN Medications: acetaminophen, antiseptic oral rinse, dextrose, ondansetron (ZOFRAN) IV, mouth rinse, oxyCODONE, polyvinyl alcohol, sodium chloride flush   Assessment:   Morbidly obese 52 year old male with type 2 diabetes, sleep apnea and ischemic cardiomyopathy with EF of 10%.  Pre and postoperative RV function appear to be adequate.  Patient was extubated postop day 1 and has been mobilized out of bed to the chair with PA catheter removed on postop day 4.  Now ambulating in the hall.  Postop day 7 Limited echo showing adequate RV function.  Inotropes weaned to low-dose epinephrine and milrinone postop day 5, discontinued postop day 7. Coox off IV inotropes today 59%. Plan/Discussion:     Continue diuresis and physical therapy Patient being optimized for admission to CIR next week.  I reviewed the LVAD parameters from today, and compared the results to the patient's prior recorded data.  No programming changes were made.  The LVAD is functioning within specified parameters.  The patient performs LVAD self-test daily.  LVAD interrogation was negative for any significant power changes, alarms or PI events/speed drops.  LVAD equipment check completed and is in good working order.  Back-up equipment present.   LVAD education done on emergency procedures and precautions and reviewed exit site care.  Length of Stay: [address]  Lovett Sox 05/11/2023, 10:53 AM

## 2023-05-11 NOTE — Progress Notes (Signed)
Physical Therapy Treatment Patient Details Name: Joe [name] MRN: [id number] DOB: 07-Jun-1971 Today's Date: 05/11/2023   History of Present Illness 52 yo male admitted 04/30/23 for optimization with Heartmate III LVAD placed 11/14.  PMH: morbid obesity, DM2, HTN, HLD, OSA, CAD and systolic HF (EF 20-25%)    PT Comments  Pt reports tramadol causing him to hallucinate and not sleep resulting in feeling fatigued this AM. Pt able to perform 2 gait trials with seated rest. Pt performing power transition to and from battery with assist only to obtain equipment and don holster. Pt stating all sternal precautions end of session and remains limited at times by bowel incontinence. Will continue to follow.   HR 80 BP 85/52 (63) Speed 6200, flow 6.2, PI 2.5, power 5.2    If plan is discharge home, recommend the following: A little help with walking and/or transfers;A little help with bathing/dressing/bathroom;Assist for transportation;Assistance with cooking/housework   Can travel by private vehicle        Equipment Recommendations  None recommended by PT    Recommendations for Other Services       Precautions / Restrictions Precautions Precautions: Sternal;Other (comment) Precaution Comments: LVAD     Mobility  Bed Mobility Overal bed mobility: Needs Assistance Bed Mobility: Sit to Supine       Sit to supine: Mod assist   General bed mobility comments: mod assist to lift legs to surface, cues for sequence and mod assist to position in midline in bed    Transfers Overall transfer level: Needs assistance   Transfers: Sit to/from Stand Sit to Stand: Contact guard assist           General transfer comment: CGA to rise from bed x 3 trials with good hand placement and adherence to precautions    Ambulation/Gait Ambulation/Gait assistance: Supervision Gait Distance (Feet): 300 Feet Assistive device: Fara Boros Gait Pattern/deviations: Step-through pattern,  Decreased stride length   Gait velocity interpretation: 1.31 - 2.62 ft/sec, indicative of limited community ambulator   General Gait Details: pt walked 150' then 300' after seated rest with reliance on EVA walker preference over RW due to upright rollator at home. Pt with VSS on RA with gait   Stairs             Wheelchair Mobility     Tilt Bed    Modified Rankin (Stroke Patients Only)       Balance Overall balance assessment: Needs assistance   Sitting balance-Leahy Scale: Good     Standing balance support: No upper extremity supported, Bilateral upper extremity supported Standing balance-Leahy Scale: Fair Standing balance comment: EVA in standing, can static stand without support                            Cognition Arousal: Alert Behavior During Therapy: WFL for tasks assessed/performed Overall Cognitive Status: Within Functional Limits for tasks assessed                                          Exercises      General Comments        Pertinent Vitals/Pain Pain Assessment Pain Assessment: No/denies pain    Home Living                          Prior Function  PT Goals (current goals can now be found in the care plan section) Progress towards PT goals: Progressing toward goals    Frequency    Min 1X/week      PT Plan      Co-evaluation              AM-PAC PT "6 Clicks" Mobility   Outcome Measure  Help needed turning from your back to your side while in a flat bed without using bedrails?: A Little Help needed moving from lying on your back to sitting on the side of a flat bed without using bedrails?: A Little Help needed moving to and from a bed to a chair (including a wheelchair)?: A Little Help needed standing up from a chair using your arms (e.g., wheelchair or bedside chair)?: A Little Help needed to walk in hospital room?: A Little Help needed climbing 3-5 steps with a  railing? : Total 6 Click Score: 16    End of Session   Activity Tolerance: Patient tolerated treatment well Patient left: with call bell/phone within reach;in bed Nurse Communication: Mobility status PT Visit Diagnosis: Other abnormalities of gait and mobility (R26.89);Muscle weakness (generalized) (M62.81)     Time: 0922-1008 PT Time Calculation (min) (ACUTE ONLY): 46 min  Charges:    $Gait Training: 23-37 mins $Therapeutic Activity: 8-22 mins PT General Charges $$ ACUTE PT VISIT: 1 Visit                     Merryl Hacker, PT Acute Rehabilitation Services Office: [phone]    Enedina Finner Jahon Bart 05/11/2023, 10:15 AM

## 2023-05-12 DIAGNOSIS — I5023 Acute on chronic systolic (congestive) heart failure: Secondary | ICD-10-CM | POA: Diagnosis not present

## 2023-05-12 LAB — CBC
HCT: 29.2 % — ABNORMAL LOW (ref 39.0–52.0)
Hemoglobin: 9.1 g/dL — ABNORMAL LOW (ref 13.0–17.0)
MCH: 30.4 pg (ref 26.0–34.0)
MCHC: 31.2 g/dL (ref 30.0–36.0)
MCV: 97.7 fL (ref 80.0–100.0)
Platelets: 290 10*3/uL (ref 150–400)
RBC: 2.99 MIL/uL — ABNORMAL LOW (ref 4.22–5.81)
RDW: 17.2 % — ABNORMAL HIGH (ref 11.5–15.5)
WBC: 11.1 10*3/uL — ABNORMAL HIGH (ref 4.0–10.5)
nRBC: 0.2 % (ref 0.0–0.2)

## 2023-05-12 LAB — COOXEMETRY PANEL
Carboxyhemoglobin: 2 % — ABNORMAL HIGH (ref 0.5–1.5)
Carboxyhemoglobin: 1.7 % — ABNORMAL HIGH (ref 0.5–1.5)
Carboxyhemoglobin: 2.7 % — ABNORMAL HIGH (ref 0.5–1.5)
Methemoglobin: <0.7 % (ref 0.0–1.5)
Methemoglobin: <0.7 % (ref 0.0–1.5)
Methemoglobin: <0.7 % (ref 0.0–1.5)
O2 Saturation: 46.2 %
O2 Saturation: 38.1 %
O2 Saturation: 50.5 %
Total hemoglobin: 9 g/dL — ABNORMAL LOW (ref 12.0–16.0)
Total hemoglobin: 9.5 g/dL — ABNORMAL LOW (ref 12.0–16.0)
Total hemoglobin: 8.8 g/dL — ABNORMAL LOW (ref 12.0–16.0)

## 2023-05-12 LAB — BASIC METABOLIC PANEL
Anion gap: 10 (ref 5–15)
BUN: 18 mg/dL (ref 6–20)
CO2: 32 mmol/L (ref 22–32)
Calcium: 8.2 mg/dL — ABNORMAL LOW (ref 8.9–10.3)
Chloride: 91 mmol/L — ABNORMAL LOW (ref 98–111)
Creatinine, Ser: 1.15 mg/dL (ref 0.61–1.24)
GFR, Estimated: >60 mL/min (ref >60)
Glucose, Bld: 91 mg/dL (ref 70–99)
Potassium: 4.1 mmol/L (ref 3.5–5.1)
Sodium: 133 mmol/L — ABNORMAL LOW (ref 135–145)

## 2023-05-12 LAB — LACTATE DEHYDROGENASE: LDH: 219 U/L — ABNORMAL HIGH (ref 98–192)

## 2023-05-12 LAB — GLUCOSE, CAPILLARY
Glucose-Capillary: 94 mg/dL (ref 70–99)
Glucose-Capillary: 173 mg/dL — ABNORMAL HIGH (ref 70–99)
Glucose-Capillary: 114 mg/dL — ABNORMAL HIGH (ref 70–99)
Glucose-Capillary: 198 mg/dL — ABNORMAL HIGH (ref 70–99)

## 2023-05-12 LAB — PROTIME-INR
INR: 1.7 — ABNORMAL HIGH (ref 0.8–1.2)
Prothrombin Time: 20.5 s — ABNORMAL HIGH (ref 11.4–15.2)

## 2023-05-12 MED ORDER — ALBUMIN HUMAN 5 % IV SOLN
12.5000 g | Freq: Once | INTRAVENOUS | Status: AC
  Administered 2023-05-12: 12.5 g via INTRAVENOUS
  Filled 2023-05-12: qty 250

## 2023-05-12 MED ORDER — WARFARIN SODIUM 5 MG PO TABS
5.0000 mg | ORAL_TABLET | Freq: Once | ORAL | Status: AC
  Administered 2023-05-12: 5 mg via ORAL
  Filled 2023-05-12: qty 1

## 2023-05-12 MED ORDER — MILRINONE LACTATE IN DEXTROSE 20-5 MG/100ML-% IV SOLN
0.1250 ug/kg/min | INTRAVENOUS | Status: DC
  Administered 2023-05-12 – 2023-05-15 (×9): 0.25 ug/kg/min via INTRAVENOUS
  Administered 2023-05-15 – 2023-05-18 (×5): 0.125 ug/kg/min via INTRAVENOUS
  Filled 2023-05-12 (×13): qty 100

## 2023-05-12 NOTE — Progress Notes (Signed)
Advanced Heart Failure VAD Team Note  PCP-Cardiologist: [name], MD   Subjective:    11/14: s/p HM3 LVAD implantation.   Transferred to 2C. On lasix 80 IV bid. Out 5L. Very orthostatic this am. MAPs 80-90s at rest.  CVP 15. Co-ox 46%  Innumerable PI events   LVAD INTERROGATION:  HeartMate HM3 LVAD: Flow 6.1, Speed 6100, PI 1.7, Power 5.0 Innumerable PI events   Objective:    Vital Signs:   Temp:  [97.6 F (36.4 C)-98.4 F (36.9 C)] 98.4 F (36.9 C) (11/23 0744) Pulse Rate:  [33-189] 80 (11/23 0744) Resp:  [13-23] 16 (11/23 0400) BP: (74-121)/(55-101) 99/79 (11/23 0744) SpO2:  [84 %-100 %] 93 % (11/23 0400) Weight:  [139.8 kg] 139.8 kg (11/23 0524) Last BM Date : 05/10/23  Mean arterial Pressure 80-90s  Intake/Output:   Intake/Output Summary (Last 24 hours) at 05/12/2023 0831 Last data filed at 05/12/2023 0508 Gross per 24 hour  Intake --  Output 4550 ml  Net -4550 ml   Physical Exam    General:  Sitting up in bed. NAD.  HEENT: normal  Neck: supple. JVP up.  Carotids 2+ bilat; no bruits. No lymphadenopathy or thryomegaly appreciated. Cor: LVAD hum.  Lungs: Clear. Abdomen: obese soft, nontender, non-distended. No hepatosplenomegaly. No bruits or masses. Good bowel sounds. Driveline site clean. Anchor in place.  Extremities: no cyanosis, clubbing, rash. Warm no edema  Neuro: alert & oriented x 3. No focal deficits. Moves all 4 without problem    Telemetry   V paced (personally reviewed)  Labs   Basic Metabolic Panel: Recent Labs  Lab 05/06/23 0414 05/07/23 0400 05/08/23 0450 05/09/23 0414 05/09/23 1334 05/10/23 0444 05/10/23 1626 05/11/23 0352 05/11/23 1745 05/12/23 0415  NA 135 137 134* 134*   < > 132* 133* 131* 132* 133*  K 4.3 3.3* 3.5 2.9*   < > 3.5 3.8 4.0 3.9 4.1  CL 100 98 95* 93*   < > 91* 92* 91* 92* 91*  CO2 27 30 29  33*   < > 34* 32 28 30 32  GLUCOSE 198* 165* 127* 120*   < > 89 89 114* 209* 91  BUN 18 21* 15 11   < > 12  14 14 17 18   CREATININE 0.99 1.14 1.04 1.05   < > 0.92 0.87 1.13 1.12 1.15  CALCIUM 8.8* 8.1* 8.6* 8.3*   < > 8.1* 8.3* 7.9* 8.0* 8.2*  MG 2.2 1.9 2.0 1.9  --  2.1  --  2.1  --   --   PHOS 2.5 3.2 3.1 3.7  --  4.0  --   --   --   --    < > = values in this interval not displayed.    Liver Function Tests: Recent Labs  Lab 05/06/23 0414 05/08/23 0450  AST 34 44*  ALT 17 21  ALKPHOS 53 81  BILITOT 2.2* 1.3*  PROT 5.9* 5.8*  ALBUMIN 3.0* 2.7*   No results for input(s): "LIPASE", "AMYLASE" in the last 168 hours. No results for input(s): "AMMONIA" in the last 168 hours.  CBC: Recent Labs  Lab 05/06/23 0414 05/07/23 0400 05/08/23 0450 05/09/23 0414 05/10/23 0444 05/11/23 0352 05/12/23 0415  WBC 16.3* 13.2* 11.3* 13.2* 9.3 10.1 11.1*  NEUTROABS 13.6* 10.2* 8.5* 10.7* 6.9  --   --   HGB 8.3* 8.0* 8.1* 8.0* 8.3* 8.5* 9.1*  HCT 25.8* 24.3* 24.4* 24.3* 25.5* 26.8* 29.2*  MCV 97.0 98.0 98.0 97.2 97.3 98.2  97.7  PLT 149* 160 188 183 231 262 290    INR: Recent Labs  Lab 05/08/23 0450 05/09/23 0414 05/10/23 0444 05/11/23 0352 05/12/23 0415  INR 2.0* 2.6* 2.1* 2.0* 1.7*    Other results:   Imaging   VAS [place] UPPER EXTREMITY VENOUS DUPLEX  Result Date: 05/10/2023 UPPER VENOUS STUDY  Patient Name:  Joe [name] [facility]  Date of Exam:   05/10/2023 Medical Rec #: [id number]               Accession #:    [id number] Date of Birth: 05/07/1971                Patient Gender: M Patient Age:   52 years Exam Location:  [facility] Procedure:      VAS [place] UPPER EXTREMITY VENOUS DUPLEX Referring Phys: [place] LEE --------------------------------------------------------------------------------  Indications: Swelling Risk Factors: None identified. Limitations: Poor ultrasound/tissue interface. Comparison Study: No prior studies. Performing Technologist: Chanda Busing RVT  Examination Guidelines: A complete evaluation includes B-mode imaging, spectral Doppler, color Doppler, and power  Doppler as needed of all accessible portions of each vessel. Bilateral testing is considered an integral part of a complete examination. Limited examinations for reoccurring indications may be performed as noted.  Right Findings: +----------+------------+---------+-----------+----------+-------+ RIGHT     CompressiblePhasicitySpontaneousPropertiesSummary +----------+------------+---------+-----------+----------+-------+ Subclavian    Full       Yes       Yes                      +----------+------------+---------+-----------+----------+-------+  Left Findings: +----------+------------+---------+-----------+----------+-------+ LEFT      CompressiblePhasicitySpontaneousPropertiesSummary +----------+------------+---------+-----------+----------+-------+ IJV           Full       Yes       Yes                      +----------+------------+---------+-----------+----------+-------+ Subclavian    Full       Yes       Yes                      +----------+------------+---------+-----------+----------+-------+ Axillary      Full       Yes       Yes                      +----------+------------+---------+-----------+----------+-------+ Brachial      Full                                          +----------+------------+---------+-----------+----------+-------+ Radial        Full                                          +----------+------------+---------+-----------+----------+-------+ Ulnar         Full                                          +----------+------------+---------+-----------+----------+-------+ Cephalic      Full                                          +----------+------------+---------+-----------+----------+-------+  Basilic       Full                                          +----------+------------+---------+-----------+----------+-------+  Summary:  Right: No evidence of thrombosis in the subclavian.  Left: No evidence of deep vein  thrombosis in the upper extremity. No evidence of superficial vein thrombosis in the upper extremity.  *See table(s) above for measurements and observations.  Diagnosing physician: Carolynn Sayers Electronically signed by Carolynn Sayers on 05/10/2023 at 2:34:52 PM.    Final     Medications:    Scheduled Medications:  allopurinol  100 mg Oral Daily   amiodarone  200 mg Oral BID   aspirin EC  81 mg Oral Daily   bisacodyl  10 mg Oral Daily   Or   bisacodyl  10 mg Rectal Daily   Chlorhexidine Gluconate Cloth  6 each Topical Daily   escitalopram  10 mg Oral QHS   ezetimibe  10 mg Oral Daily   Fe Fum-Vit C-Vit B12-FA  1 capsule Oral QPC breakfast   furosemide  80 mg Intravenous BID   gabapentin  400 mg Oral TID   insulin aspart  0-24 Units Subcutaneous TID WC & HS   insulin detemir  20 Units Subcutaneous BID   levothyroxine  25 mcg Oral Daily   mexiletine  250 mg Oral Q12H   rosuvastatin  40 mg Oral Daily   sodium chloride flush  10-40 mL Intracatheter Q12H   sodium chloride flush  3 mL Intravenous Q12H   spironolactone  12.5 mg Oral Daily   Warfarin - Pharmacist Dosing Inpatient   Does not apply q1600    Infusions:    PRN Medications: acetaminophen, antiseptic oral rinse, dextrose, ondansetron (ZOFRAN) IV, mouth rinse, oxyCODONE, polyvinyl alcohol, sodium chloride flush  Patient Profile   Joe [name] is a 52 y.o. male with h/o morbid obesity, DM2, HTN, HL, OSA, CAD and systolic HF.  Now s/p HM3 LVAD Insertion 11/14 by Dr. Donata Clay.  Assessment/Plan:    1.  Chronic Systolic HF s/p HM3 Insertion 40/98 - due to iCM - Echo 05/01/23: EF 20%, RV moderately down (improved) - R/LHC (9/24): stable 3v CAD, elevated biventricular filling pressures with low PAPi; CI 2.2 - S/p HM III LVAD 05/03/23 by Dr. Maren Beach - Intra-op TEE - EF 20-25%, RV moderately reduced, IV septum with mild rightward bowing, AV opens intermittently - Limited Echo 11/18: LV decompressed, RV appeared dilated but  difficult windows.  - Limited Echo 11/21: ?RV function not well visualized - Co-ox down to 46, CVP 15 PI low. Suspect RV failure - Stop diuretics. Let CVP run a bit. I turned VAD down to 6000.  - Repeat co-ox. If < 50% restart milrinone - Discussed warfarin dosing with PharmD personally. INR 1.7  - Will need ramp echo  2. H/o VT Arrest  Hx PVCs - had VT arrest x 2 in 10/24 - Continue Amio 200 mg bid and Mexiletine 250 mg bid   3. CAD with chronic stable angina - s/p multiple stents to LAD and LCX - LHC (9/24) with stable 3v CAD - No s/s angina - Continue ASA + Crestor 40mg  daily  4. AKI  - Resolved   5. DM2 - A1c 7.2 (1/24) - SSI   6. OSA - Not using CPAP, says machine was recalled a while ago. Needs new  machine. - Followed outpatient   7. Hypokalemia - K 4.1  Length of Stay: 12  Arvilla Meres, MD 05/12/2023, 8:31 AM  VAD Team --- VAD ISSUES ONLY--- Pager [phone] (7am - 7am)  Advanced Heart Failure Team  Pager [phone] (M-F; 7a - 5p)  Please contact [facility] for night-coverage after hours (5p -7a ) and weekends on [email or web address]

## 2023-05-12 NOTE — Progress Notes (Signed)
PHARMACY - ANTICOAGULATION CONSULT NOTE  Pharmacy Consult for warfarin Indication: LVAD HM3 implanted 11/14  Allergies  Allergen Reactions   Tramadol Other (See Comments)    Hallucinations    Patient Measurements: Height: 6\' 3"  (190.5 cm) Weight: (!) 139.8 kg (308 lb 3.3 oz) IBW/kg (Calculated) : 84.5   Vital Signs: Temp: 98.4 F (36.9 C) (11/23 0744) Temp Source: Oral (11/23 0744) BP: 99/79 (11/23 0744) Pulse Rate: 80 (11/23 0744)  Labs: Recent Labs    05/10/23 0444 05/10/23 1626 05/11/23 0352 05/11/23 1745 05/12/23 0415  HGB 8.3*  --  8.5*  --  9.1*  HCT 25.5*  --  26.8*  --  29.2*  PLT 231  --  262  --  290  LABPROT 23.5*  --  23.0*  --  20.5*  INR 2.1*  --  2.0*  --  1.7*  CREATININE 0.92   < > 1.13 1.12 1.15   < > = values in this interval not displayed.    Estimated Creatinine Clearance: 113.3 mL/min (by C-G formula based on SCr of 1.15 mg/dL).   Medical History: Past Medical History:  Diagnosis Date   Abnormal EKG    Acute systolic heart failure (HCC) 09/06/2017   Angina pectoris (HCC) 09/05/2017   Body mass index 45.0-49.9, adult (HCC)    CAD S/P percutaneous coronary angioplasty 09/07/2017   mLAD and dLAD PCI with DES 09/06/17   CHF (congestive heart failure) (HCC)    Chronic systolic (congestive) heart failure (HCC) 03/19/2019   Complication of anesthesia 1995   "had hard time waking up; breathing w/vasectomy"   Coronary artery disease    Erectile dysfunction    Essential hypertension, benign    Fatigue    GERD (gastroesophageal reflux disease)    Gout    High cholesterol    "just started tx yesterday" (09/06/2017)   History of gout    "RX prn" (09/06/2017)   Ischemic cardiomyopathy 09/07/2017   EF 25-35%   Muscular chest pain    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   Below Left Nare (MOH's)   Obesity    Obstructive sleep apnea    OSA on CPAP    Plantar fasciitis    Pre-operative clearance 02/11/2018   Presence of cardiac  resynchronization therapy defibrillator (CRT-D) 04/02/2019   Saint Jude   Right bundle branch block    Shortness of breath 09/05/2017   Testosterone deficiency    Type 2 diabetes mellitus with complication, without long-term current use of insulin (HCC) 09/05/2017   Type II diabetes mellitus (HCC)    "started tx 08/28/2017"    Assessment: 52yom with heart failure s/p LVAD implant HM3 11/14. INR slightly elevated at baseline (1.6).   INR 1.7 is subtherapeutic after restarting lower dose 11/21. Hgb 9.1, pltc 290, LDH 290 (stable).  No s/sx of bleeding. Patient has good oral intake- eating most meals. No new DDIs noted (continues on amiodarone 200 mg BID).  Goal of Therapy:  INR 2-2.5 Monitor platelets by anticoagulation protocol: Yes   Plan:  Order warfarin 5 mg x1 tonight Daily PT/INR Monitor s/s bleeding   Thank you for allowing pharmacy to participate in this patient's care,  Trixie Rude, PharmD Clinical Pharmacist 05/12/2023  8:37 AM

## 2023-05-12 NOTE — Progress Notes (Signed)
Mobility Specialist Progress Note    05/12/23 1616  Mobility  Activity Ambulated with assistance in hallway  Level of Assistance +2 (takes two people) (safety)  Assistive Device Other (Comment) (eva walker)  Distance Ambulated (ft) 320 ft  Activity Response Tolerated well  Mobility Referral Yes  $Mobility charge 1 Mobility  Mobility Specialist Start Time (ACUTE ONLY) 1548  Mobility Specialist Stop Time (ACUTE ONLY) 1615  Mobility Specialist Time Calculation (min) (ACUTE ONLY) 27 min   Pre-Mobility: 80 HR Post-Mobility: 80 HR  Pt received and agreeable. No complaints. Returned to bed with call bell in reach.   [place] Mobility Specialist  Please Neurosurgeon or Rehab Office at [phone]

## 2023-05-13 DIAGNOSIS — Z515 Encounter for palliative care: Secondary | ICD-10-CM | POA: Diagnosis not present

## 2023-05-13 DIAGNOSIS — I5023 Acute on chronic systolic (congestive) heart failure: Secondary | ICD-10-CM | POA: Diagnosis not present

## 2023-05-13 LAB — BASIC METABOLIC PANEL
Anion gap: 10 (ref 5–15)
BUN: 15 mg/dL (ref 6–20)
CO2: 29 mmol/L (ref 22–32)
Calcium: 7.8 mg/dL — ABNORMAL LOW (ref 8.9–10.3)
Chloride: 91 mmol/L — ABNORMAL LOW (ref 98–111)
Creatinine, Ser: 1.1 mg/dL (ref 0.61–1.24)
GFR, Estimated: >60 mL/min (ref >60)
Glucose, Bld: 95 mg/dL (ref 70–99)
Potassium: 3.3 mmol/L — ABNORMAL LOW (ref 3.5–5.1)
Sodium: 130 mmol/L — ABNORMAL LOW (ref 135–145)

## 2023-05-13 LAB — CBC
HCT: 24.2 % — ABNORMAL LOW (ref 39.0–52.0)
Hemoglobin: 7.8 g/dL — ABNORMAL LOW (ref 13.0–17.0)
MCH: 31.3 pg (ref 26.0–34.0)
MCHC: 32.2 g/dL (ref 30.0–36.0)
MCV: 97.2 fL (ref 80.0–100.0)
Platelets: 320 10*3/uL (ref 150–400)
RBC: 2.49 MIL/uL — ABNORMAL LOW (ref 4.22–5.81)
RDW: 17.2 % — ABNORMAL HIGH (ref 11.5–15.5)
WBC: 14.4 10*3/uL — ABNORMAL HIGH (ref 4.0–10.5)
nRBC: 0.3 % — ABNORMAL HIGH (ref 0.0–0.2)

## 2023-05-13 LAB — COOXEMETRY PANEL
Carboxyhemoglobin: 2.3 % — ABNORMAL HIGH (ref 0.5–1.5)
Methemoglobin: <0.7 % (ref 0.0–1.5)
O2 Saturation: 52.3 %
Total hemoglobin: 8.1 g/dL — ABNORMAL LOW (ref 12.0–16.0)

## 2023-05-13 LAB — PROTIME-INR
INR: 1.6 — ABNORMAL HIGH (ref 0.8–1.2)
Prothrombin Time: 19.4 s — ABNORMAL HIGH (ref 11.4–15.2)

## 2023-05-13 LAB — GLUCOSE, CAPILLARY
Glucose-Capillary: 99 mg/dL (ref 70–99)
Glucose-Capillary: 173 mg/dL — ABNORMAL HIGH (ref 70–99)
Glucose-Capillary: 116 mg/dL — ABNORMAL HIGH (ref 70–99)
Glucose-Capillary: 139 mg/dL — ABNORMAL HIGH (ref 70–99)

## 2023-05-13 LAB — LACTATE DEHYDROGENASE: LDH: 199 U/L — ABNORMAL HIGH (ref 98–192)

## 2023-05-13 MED ORDER — POTASSIUM CHLORIDE CRYS ER 20 MEQ PO TBCR
60.0000 meq | EXTENDED_RELEASE_TABLET | ORAL | Status: AC
  Administered 2023-05-13 (×2): 60 meq via ORAL
  Filled 2023-05-13 (×2): qty 3

## 2023-05-13 MED ORDER — WARFARIN SODIUM 5 MG PO TABS
5.0000 mg | ORAL_TABLET | Freq: Once | ORAL | Status: AC
  Administered 2023-05-13: 5 mg via ORAL
  Filled 2023-05-13: qty 1

## 2023-05-13 NOTE — Progress Notes (Addendum)
Mobility Specialist Progress Note    05/13/23 1040  Mobility  Activity Ambulated with assistance in hallway  Level of Assistance Contact guard assist, steadying assist  Assistive Device Other (Comment) (eva walker)  Distance Ambulated (ft) 475 ft  Activity Response Tolerated well  Mobility Referral Yes  $Mobility charge 1 Mobility  Mobility Specialist Start Time (ACUTE ONLY) 1014  Mobility Specialist Stop Time (ACUTE ONLY) 1040  Mobility Specialist Time Calculation (min) (ACUTE ONLY) 26 min   Pre-Mobility: 80 HR, 96% SpO2 During Mobility: 87 HR Post-Mobility: 81 HR  Pt received in bed and agreeable. Pt able to transfer himself from wall to battery power. No complaints on walk. Returned to chair with call bell in reach. Left on battery power.  [place] Mobility Specialist  Please Neurosurgeon or Rehab Office at [phone]

## 2023-05-13 NOTE — Progress Notes (Signed)
   Palliative Medicine Inpatient Follow Up Note HPI: Joe [name] is a 52 y.o. male with medical history significant for chronic systolic heart failure in setting of ischemic cardiomyopathy, V-fib arrest, hyperlipidemia, obstructive sleep apnea on home nocturnal CPAP, type 2 diabetes mellitus. The Palliative medicine team has been asked to assist in VAD evaluation.   Today's Discussion 05/13/2023  *Please note that this is a verbal dictation therefore any spelling or grammatical errors are due to the "Dragon [employer] One" system interpretation.  Chart reviewed inclusive of vital signs, progress notes, laboratory results, and diagnostic images. Back on milrinone, Diuretics on hold.   I met with [name] this afternoon. He shares with me that he has felt better each day. He now has minimal pocket pain. He states that he was deemed to be too functional to require a CIR admission and he is hopeful for OP rehabilitation.   We discussed the hope of the patient to get home by Thanksgiving.   Allowing time and space for [name] to express his experience thus far.   Palliative Support Provided.   Objective Assessment: Vital Signs Vitals:   05/13/23 0748 05/13/23 1139  BP: 93/62 (!) 87/73  Pulse: 80 77  Resp: 19 18  Temp: 97.9 F (36.6 C) 98.1 F (36.7 C)  SpO2:  90%    Intake/Output Summary (Last 24 hours) at 05/13/2023 1419 Last data filed at 05/13/2023 0900 Gross per 24 hour  Intake 1577 ml  Output 1550 ml  Net 27 ml   Last Weight  Most recent update: 05/13/2023  5:58 AM    Weight  141.2 kg (311 lb 4.6 oz)              SUMMARY OF RECOMMENDATIONS   Full Code / Full Scope of Care  S/P LVAD 11/14    PMT will continue to follow along incrementally  MDM - Moderate ______________________________________________________________________________________ Lamarr Lulas [facility] Palliative Medicine Team Team Cell Phone: [phone] Please utilize secure chat  with additional questions, if there is no response within 30 minutes please call the above phone number  Palliative Medicine Team providers are available by phone from 7am to 7pm daily and can be reached through the team cell phone.  Should this patient require assistance outside of these hours, please call the patient's attending physician.

## 2023-05-13 NOTE — Plan of Care (Signed)
  Problem: Cardiovascular: Goal: Ability to achieve and maintain adequate cardiovascular perfusion will improve Outcome: Progressing Goal: Vascular access site(s) Level 0-1 will be maintained Outcome: Progressing   Problem: Cardiovascular: Goal: Vascular access site(s) Level 0-1 will be maintained Outcome: Progressing

## 2023-05-13 NOTE — Progress Notes (Signed)
Occupational Therapy Treatment Patient Details Name: Joe [name] MRN: [id number] DOB: 1971-02-07 Today's Date: 05/13/2023   History of present illness 52 yo male admitted 04/30/23 for optimization with Heartmate III LVAD placed 11/14.  PMH: morbid obesity, DM2, HTN, HLD, OSA, CAD and systolic HF (EF 20-25%)   OT comments  Pt without complaints of pain. Ambulating with CGA in room/to sink. Educated in benefits of AE for LB bathing and dressing, washing back and pericare. Pt prefers to rely on wife. Instructed in use of 3 in 1 over toilet and in front of bathroom sink at home until he is able to shower. Pt manages LVAD power source change independently. Able to state items in black bag. Pt has a generator in his home should he lose electricity. Pt is aware he is to sleep on wall power source.       If plan is discharge home, recommend the following:  A little help with walking and/or transfers;A lot of help with bathing/dressing/bathroom;Assistance with cooking/housework;Assist for transportation;Help with stairs or ramp for entrance   Equipment Recommendations  BSC/3in1 (bariatric)    Recommendations for Other Services      Precautions / Restrictions Precautions Precautions: Sternal;Other (comment) Precaution Comments: LVAD Restrictions Weight Bearing Restrictions: No       Mobility Bed Mobility               General bed mobility comments: in chair    Transfers Overall transfer level: Needs assistance     Sit to Stand: Contact guard assist           General transfer comment: Has ordered a lift chair for home, also has a recliner he reports is firm and does not rock.     Balance Overall balance assessment: Needs assistance   Sitting balance-Leahy Scale: Good       Standing balance-Leahy Scale: Fair                             ADL either performed or assessed with clinical judgement   ADL Overall ADL's : Needs assistance/impaired      Grooming: Contact guard assist;Standing;Oral care;Wash/dry hands;Wash/dry face     Upper Body Bathing Details (indicate cue type and reason): recommended long handled bath sponge for back   Lower Body Bathing Details (indicate cue type and reason): educated in use of long handled bath sponge and reacher to wash and dry feet, use of BSC in front of sink at home for sponge bathing       Lower Body Dressing Details (indicate cue type and reason): Educated pt in availability of reacher and sock aid, recommended non skid slip-on shoes     Toileting- Clothing Manipulation and Hygiene: Total assistance;Sit to/from stand Toileting - Clothing Manipulation Details (indicate cue type and reason): Educated in various toilet aids.     Functional mobility during ADLs: Contact guard assist;Rolling walker (2 wheels)      Extremity/Trunk Assessment              Vision       Perception     Praxis      Cognition Arousal: Alert Behavior During Therapy: WFL for tasks assessed/performed Overall Cognitive Status: Within Functional Limits for tasks assessed  Exercises      Shoulder Instructions       General Comments      Pertinent Vitals/ Pain       Pain Assessment Pain Assessment: No/denies pain  Home Living                                          Prior Functioning/Environment              Frequency  Min 1X/week        Progress Toward Goals  OT Goals(current goals can now be found in the care plan section)  Progress towards OT goals: Progressing toward goals  Acute Rehab OT Goals OT Goal Formulation: With patient Time For Goal Achievement: 05/22/23 Potential to Achieve Goals: Good  Plan      Co-evaluation                 AM-PAC OT "6 Clicks" Daily Activity     Outcome Measure   Help from another person eating meals?: None Help from another person taking care of  personal grooming?: A Little Help from another person toileting, which includes using toliet, bedpan, or urinal?: Total Help from another person bathing (including washing, rinsing, drying)?: A Lot Help from another person to put on and taking off regular upper body clothing?: A Little Help from another person to put on and taking off regular lower body clothing?: Total 6 Click Score: 14    End of Session    OT Visit Diagnosis: Unsteadiness on feet (R26.81);Muscle weakness (generalized) (M62.81)   Activity Tolerance     Patient Left in chair;with call bell/phone within reach   Nurse Communication          Time: 7253-6644 OT Time Calculation (min): 30 min  Charges: OT General Charges $OT Visit: 1 Visit OT Treatments $Self Care/Home Management : 23-37 mins  Berna Spare, OTR/L Acute Rehabilitation Services Office: [phone]  Evern Bio 05/13/2023, 4:02 PM

## 2023-05-13 NOTE — Progress Notes (Signed)
PHARMACY - ANTICOAGULATION CONSULT NOTE  Pharmacy Consult for warfarin Indication: LVAD HM3 implanted 11/14  Allergies  Allergen Reactions   Tramadol Other (See Comments)    Hallucinations    Patient Measurements: Height: 6\' 3"  (190.5 cm) Weight: (!) 141.2 kg (311 lb 4.6 oz) IBW/kg (Calculated) : 84.5   Vital Signs: Temp: 98.5 F (36.9 C) (11/24 0531) Temp Source: Oral (11/24 0531) BP: 89/76 (11/24 0531) Pulse Rate: 80 (11/24 0531)  Labs: Recent Labs    05/11/23 0352 05/11/23 1745 05/12/23 0415 05/13/23 0536  HGB 8.5*  --  9.1* 7.8*  HCT 26.8*  --  29.2* 24.2*  PLT 262  --  290 320  LABPROT 23.0*  --  20.5* 19.4*  INR 2.0*  --  1.7* 1.6*  CREATININE 1.13 1.12 1.15 1.10    Estimated Creatinine Clearance: 119.1 mL/min (by C-G formula based on SCr of 1.1 mg/dL).   Medical History: Past Medical History:  Diagnosis Date   Abnormal EKG    Acute systolic heart failure (HCC) 09/06/2017   Angina pectoris (HCC) 09/05/2017   Body mass index 45.0-49.9, adult (HCC)    CAD S/P percutaneous coronary angioplasty 09/07/2017   mLAD and dLAD PCI with DES 09/06/17   CHF (congestive heart failure) (HCC)    Chronic systolic (congestive) heart failure (HCC) 03/19/2019   Complication of anesthesia 1995   "had hard time waking up; breathing w/vasectomy"   Coronary artery disease    Erectile dysfunction    Essential hypertension, benign    Fatigue    GERD (gastroesophageal reflux disease)    Gout    High cholesterol    "just started tx yesterday" (09/06/2017)   History of gout    "RX prn" (09/06/2017)   Ischemic cardiomyopathy 09/07/2017   EF 25-35%   Muscular chest pain    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   Below Left Nare (MOH's)   Obesity    Obstructive sleep apnea    OSA on CPAP    Plantar fasciitis    Pre-operative clearance 02/11/2018   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/02/2019   Saint Jude   Right bundle branch block     Shortness of breath 09/05/2017   Testosterone deficiency    Type 2 diabetes mellitus with complication, without long-term current use of insulin (HCC) 09/05/2017   Type II diabetes mellitus (HCC)    "started tx 08/28/2017"    Assessment: 52yom with heart failure s/p LVAD implant HM3 11/14. INR slightly elevated at baseline (1.6).   INR 1.6 is subtherapeutic and trending down after restarting lower dose 11/21. Hgb 7.8 (down), pltc 320, LDH 199 (stable).  No s/sx of bleeding.  Confirmed with RN that patient has good oral intake- eating most meals. No new DDIs noted (continues on amiodarone 200 mg BID).  Goal of Therapy:  INR 2-2.5 Monitor platelets by anticoagulation protocol: Yes   Plan:  Order warfarin 5 mg x1 tonight Daily PT/INR Monitor s/s bleeding   Thank you for allowing pharmacy to participate in this patient's care,  Trixie Rude, PharmD Clinical Pharmacist 05/13/2023  7:09 AM

## 2023-05-13 NOTE — Progress Notes (Signed)
Advanced Heart Failure VAD Team Note  PCP-Cardiologist: [name], MD   Subjective:    11/14: s/p HM3 LVAD implantation.   Milrinone restarted yesterday and diuretics held after co-ox dropped into 40s and was very orthostatic. VAD speed dropped to 6000  Now on milrinone 0.125 CVP 12/13 Feels much better. Walked 3 laps in hall. Still with some watery BMs     LVAD INTERROGATION:  HeartMate HM3 LVAD: Flow 5.6, Speed 6000, PI 3.7, Power 6.0   Objective:    Vital Signs:   Temp:  [97.5 F (36.4 C)-98.6 F (37 C)] 98.1 F (36.7 C) (11/24 1139) Pulse Rate:  [77-80] 77 (11/24 1139) Resp:  [15-23] 18 (11/24 1139) BP: (86-93)/(62-82) 87/73 (11/24 1139) SpO2:  [82 %-94 %] 90 % (11/24 1139) Weight:  [141.2 kg] 141.2 kg (11/24 0558) Last BM Date : 05/13/23  Mean arterial Pressure 70-80s  Intake/Output:   Intake/Output Summary (Last 24 hours) at 05/13/2023 1301 Last data filed at 05/13/2023 0900 Gross per 24 hour  Intake 1577 ml  Output 1550 ml  Net 27 ml   Physical Exam    General:  NAD. Sitting in chair HEENT: normal  Neck: supple. JVP 10.  Carotids 2+ bilat; no bruits. No lymphadenopathy or thryomegaly appreciated. Cor: LVAD hum.  Lungs: Clear. Abdomen: obese soft, nontender, non-distended. No hepatosplenomegaly. No bruits or masses. Good bowel sounds. Driveline site clean. Anchor in place.  Extremities: no cyanosis, clubbing, rash. Warm 1+  edema  Neuro: alert & oriented x 3. No focal deficits. Moves all 4 without problem    Telemetry   V paced 80 (personally reviewed)  Labs   Basic Metabolic Panel: Recent Labs  Lab 05/07/23 0400 05/08/23 0450 05/09/23 0414 05/09/23 1334 05/10/23 0444 05/10/23 1626 05/11/23 0352 05/11/23 1745 05/12/23 0415 05/13/23 0536  NA 137 134* 134*   < > 132* 133* 131* 132* 133* 130*  K 3.3* 3.5 2.9*   < > 3.5 3.8 4.0 3.9 4.1 3.3*  CL 98 95* 93*   < > 91* 92* 91* 92* 91* 91*  CO2 30 29 33*   < > 34* 32 28 30 32 29   GLUCOSE 165* 127* 120*   < > 89 89 114* 209* 91 95  BUN 21* 15 11   < > 12 14 14 17 18 15   CREATININE 1.14 1.04 1.05   < > 0.92 0.87 1.13 1.12 1.15 1.10  CALCIUM 8.1* 8.6* 8.3*   < > 8.1* 8.3* 7.9* 8.0* 8.2* 7.8*  MG 1.9 2.0 1.9  --  2.1  --  2.1  --   --   --   PHOS 3.2 3.1 3.7  --  4.0  --   --   --   --   --    < > = values in this interval not displayed.    Liver Function Tests: Recent Labs  Lab 05/08/23 0450  AST 44*  ALT 21  ALKPHOS 81  BILITOT 1.3*  PROT 5.8*  ALBUMIN 2.7*   No results for input(s): "LIPASE", "AMYLASE" in the last 168 hours. No results for input(s): "AMMONIA" in the last 168 hours.  CBC: Recent Labs  Lab 05/07/23 0400 05/08/23 0450 05/09/23 0414 05/10/23 0444 05/11/23 0352 05/12/23 0415 05/13/23 0536  WBC 13.2* 11.3* 13.2* 9.3 10.1 11.1* 14.4*  NEUTROABS 10.2* 8.5* 10.7* 6.9  --   --   --   HGB 8.0* 8.1* 8.0* 8.3* 8.5* 9.1* 7.8*  HCT 24.3* 24.4* 24.3* 25.5*  26.8* 29.2* 24.2*  MCV 98.0 98.0 97.2 97.3 98.2 97.7 97.2  PLT 160 188 183 231 262 290 320    INR: Recent Labs  Lab 05/09/23 0414 05/10/23 0444 05/11/23 0352 05/12/23 0415 05/13/23 0536  INR 2.6* 2.1* 2.0* 1.7* 1.6*    Other results:   Imaging   No results found.  Medications:    Scheduled Medications:  allopurinol  100 mg Oral Daily   amiodarone  200 mg Oral BID   aspirin EC  81 mg Oral Daily   bisacodyl  10 mg Oral Daily   Or   bisacodyl  10 mg Rectal Daily   Chlorhexidine Gluconate Cloth  6 each Topical Daily   escitalopram  10 mg Oral QHS   ezetimibe  10 mg Oral Daily   Fe Fum-Vit C-Vit B12-FA  1 capsule Oral QPC breakfast   gabapentin  400 mg Oral TID   insulin aspart  0-24 Units Subcutaneous TID WC & HS   insulin detemir  20 Units Subcutaneous BID   levothyroxine  25 mcg Oral Daily   mexiletine  250 mg Oral Q12H   potassium chloride  60 mEq Oral Q4H   rosuvastatin  40 mg Oral Daily   sodium chloride flush  10-40 mL Intracatheter Q12H   sodium chloride  flush  3 mL Intravenous Q12H   spironolactone  12.5 mg Oral Daily   Warfarin - Pharmacist Dosing Inpatient   Does not apply q1600    Infusions:  milrinone 0.25 mcg/kg/min (05/13/23 0819)     PRN Medications: acetaminophen, antiseptic oral rinse, dextrose, ondansetron (ZOFRAN) IV, mouth rinse, oxyCODONE, polyvinyl alcohol, sodium chloride flush  Patient Profile   Joe [name] is a 52 y.o. male with h/o morbid obesity, DM2, HTN, HL, OSA, CAD and systolic HF.  Now s/p HM3 LVAD Insertion 11/14 by Dr. Donata Clay.  Assessment/Plan:    1.  Chronic Systolic HF s/p HM3 Insertion 29/56 - due to iCM - Echo 05/01/23: EF 20%, RV moderately down (improved) - R/LHC (9/24): stable 3v CAD, elevated biventricular filling pressures with low PAPi; CI 2.2 - S/p HM III LVAD 05/03/23 by Dr. Maren Beach - Intra-op TEE - EF 20-25%, RV moderately reduced, IV septum with mild rightward bowing, AV opens intermittently - Limited Echo 11/18: LV decompressed, RV appeared dilated but difficult windows.  - Limited Echo 11/21: ?RV function not well visualized - Milrinone restarted 11/23 Co-ox 46% -> 52% - CVP 12 Holding lasix with RV failure. Weight still up about 5 pounds. Will continue TED hose and spiro. Can restart oral diuretics in 1-2 days as needed - Continue milrinone for now.  - I did POCUS echo today and RV didn't look too bad (limited images)  - Will need RAMP echo tomorrow - INR 1.6  Discussed warfarin dosing with PharmD personally. - LDH 166  2. H/o VT Arrest  Hx PVCs - had VT arrest x 2 in 10/24 - Continue Amio 200 mg bid and Mexiletine 250 mg bid   3. CAD with chronic stable angina - s/p multiple stents to LAD and LCX - LHC (9/24) with stable 3v CAD - No s/s angina - Continue ASA + Crestor 40mg  daily  4. AKI  - Resolved   5. DM2 - A1c 7.2 (1/24) - SSI   6. OSA - Not using CPAP, says machine was recalled a while ago. Needs new machine. - Followed outpatient   7. Hypokalemia - K  3.3 supp  Length of Stay: 74  Maurianna Benard  Cerise Lieber, MD 05/13/2023, 1:01 PM  VAD Team --- VAD ISSUES ONLY--- Pager [phone] (7am - 7am)  Advanced Heart Failure Team  Pager [phone] (M-F; 7a - 5p)  Please contact [facility] for night-coverage after hours (5p -7a ) and weekends on [email or web address]

## 2023-05-14 ENCOUNTER — Other Ambulatory Visit (HOSPITAL_COMMUNITY): Payer: 59

## 2023-05-14 ENCOUNTER — Inpatient Hospital Stay (HOSPITAL_COMMUNITY): Payer: 59

## 2023-05-14 DIAGNOSIS — Z95811 Presence of heart assist device: Secondary | ICD-10-CM | POA: Diagnosis not present

## 2023-05-14 DIAGNOSIS — I509 Heart failure, unspecified: Secondary | ICD-10-CM | POA: Diagnosis not present

## 2023-05-14 DIAGNOSIS — I255 Ischemic cardiomyopathy: Secondary | ICD-10-CM | POA: Diagnosis not present

## 2023-05-14 DIAGNOSIS — I5023 Acute on chronic systolic (congestive) heart failure: Secondary | ICD-10-CM | POA: Diagnosis not present

## 2023-05-14 LAB — CBC
HCT: 24.6 % — ABNORMAL LOW (ref 39.0–52.0)
Hemoglobin: 7.6 g/dL — ABNORMAL LOW (ref 13.0–17.0)
MCH: 30.5 pg (ref 26.0–34.0)
MCHC: 30.9 g/dL (ref 30.0–36.0)
MCV: 98.8 fL (ref 80.0–100.0)
Platelets: 335 10*3/uL (ref 150–400)
RBC: 2.49 MIL/uL — ABNORMAL LOW (ref 4.22–5.81)
RDW: 17.3 % — ABNORMAL HIGH (ref 11.5–15.5)
WBC: 15 10*3/uL — ABNORMAL HIGH (ref 4.0–10.5)
nRBC: 0.3 % — ABNORMAL HIGH (ref 0.0–0.2)

## 2023-05-14 LAB — LACTATE DEHYDROGENASE: LDH: 210 U/L — ABNORMAL HIGH (ref 98–192)

## 2023-05-14 LAB — BASIC METABOLIC PANEL
Anion gap: 9 (ref 5–15)
BUN: 12 mg/dL (ref 6–20)
CO2: 27 mmol/L (ref 22–32)
Calcium: 8 mg/dL — ABNORMAL LOW (ref 8.9–10.3)
Chloride: 93 mmol/L — ABNORMAL LOW (ref 98–111)
Creatinine, Ser: 1 mg/dL (ref 0.61–1.24)
GFR, Estimated: >60 mL/min (ref >60)
Glucose, Bld: 133 mg/dL — ABNORMAL HIGH (ref 70–99)
Potassium: 3.9 mmol/L (ref 3.5–5.1)
Sodium: 129 mmol/L — ABNORMAL LOW (ref 135–145)

## 2023-05-14 LAB — GLUCOSE, CAPILLARY
Glucose-Capillary: 129 mg/dL — ABNORMAL HIGH (ref 70–99)
Glucose-Capillary: 154 mg/dL — ABNORMAL HIGH (ref 70–99)
Glucose-Capillary: 125 mg/dL — ABNORMAL HIGH (ref 70–99)
Glucose-Capillary: 187 mg/dL — ABNORMAL HIGH (ref 70–99)

## 2023-05-14 LAB — PROTIME-INR
INR: 1.7 — ABNORMAL HIGH (ref 0.8–1.2)
Prothrombin Time: 19.9 s — ABNORMAL HIGH (ref 11.4–15.2)

## 2023-05-14 LAB — ECHOCARDIOGRAM LIMITED
Height: 75 in
Weight: 4977.1 [oz_av]

## 2023-05-14 LAB — COOXEMETRY PANEL
Carboxyhemoglobin: 2.2 % — ABNORMAL HIGH (ref 0.5–1.5)
Methemoglobin: <0.7 % (ref 0.0–1.5)
O2 Saturation: 57.6 %
Total hemoglobin: 9.2 g/dL — ABNORMAL LOW (ref 12.0–16.0)

## 2023-05-14 MED ORDER — WARFARIN SODIUM 5 MG PO TABS
5.0000 mg | ORAL_TABLET | Freq: Once | ORAL | Status: AC
  Administered 2023-05-14: 5 mg via ORAL
  Filled 2023-05-14: qty 1

## 2023-05-14 MED ORDER — FUROSEMIDE 10 MG/ML IJ SOLN
40.0000 mg | Freq: Once | INTRAMUSCULAR | Status: AC
  Administered 2023-05-14: 40 mg via INTRAVENOUS
  Filled 2023-05-14: qty 4

## 2023-05-14 MED ORDER — DIGOXIN 125 MCG PO TABS
0.1250 mg | ORAL_TABLET | Freq: Every day | ORAL | Status: DC
  Administered 2023-05-14 – 2023-05-21 (×8): 0.125 mg via ORAL
  Filled 2023-05-14 (×8): qty 1

## 2023-05-14 MED ORDER — POTASSIUM CHLORIDE CRYS ER 20 MEQ PO TBCR
20.0000 meq | EXTENDED_RELEASE_TABLET | Freq: Once | ORAL | Status: AC
  Administered 2023-05-14: 20 meq via ORAL
  Filled 2023-05-14: qty 1

## 2023-05-14 MED ORDER — DIGOXIN 125 MCG PO TABS: 0.0625 mg | ORAL_TABLET | Freq: Every day | ORAL | Status: DC

## 2023-05-14 NOTE — Progress Notes (Signed)
Speed  Flow  PI  Power  LVIDD  AI  Aortic opening MR  TR  Septum  RV  VTI (>18cm)  6000 5.6  4.0 4.8          Mild-moderate     6200 5.8 4.0 5.2      Bowing left     6100 5.7 4.0 5.1      Midline                                               Doppler MAP:  Auto cuff BP:    Ramp ECHO performed at bedside per Dr Gasper Lloyd. Images difficult to obtain.   At completion of ramp study, patients primary controller programmed:  Fixed speed: 6000 Low speed limit: 5700   Alyce Pagan RN VAD Coordinator  Office: [phone]  24/7 Pager: [phone]

## 2023-05-14 NOTE — Progress Notes (Signed)
CARDIAC REHAB PHASE I   PRE:  Rate/Rhythm: 80 pacing    BP: sitting 88/53 (66)    SpO2: 98 RA  MODE:  Ambulation: 710 ft   POST:  Rate/Rhythm: 80 pacing    BP: sitting 145/125 (132), recheck dopplar MAP 110     SpO2: 98 RA  Pt up in recliner, batteries on. Stood independently and walked with Carley Hammed walker (pt has Carley Hammed at home). Independent, no c/o. Feels well. Return to recliner. Automatic BP failed to register then registered severely elevated 2nd attempt. Took dopplared MAP which was also elevated at 110.   Encouraged IS, pt mobilizing well. PT in pm. [phone]  Ethelda Chick BS, ACSM-CEP 05/14/2023 11:18 AM

## 2023-05-14 NOTE — Progress Notes (Signed)
*  PRELIMINARY RESULTS* Echocardiogram 2D Echocardiogram limited for LVAD check has been performed.  Joe [name] 05/14/2023, 4:13 PM

## 2023-05-14 NOTE — Progress Notes (Signed)
PT Cancellation Note  Patient Details Name: Joe [name] MRN: [id number] DOB: 06/26/1970   Cancelled Treatment:    Reason Eval/Treat Not Completed: Patient at procedure or test/unavailable (pt walking with cardiac rehab)   Ayannah Faddis B Taedyn Glasscock 05/14/2023, 11:11 AM Merryl Hacker, PT Acute Rehabilitation Services Office: [phone]

## 2023-05-14 NOTE — TOC Progression Note (Signed)
Transition of Care [facility]) - Progression Note    Patient Details  Name: Joe [name] MRN: [id number] Date of Birth: 12-Jul-1970  [facility]) CM/SW Contact  Elliot Cousin, RN Phone Number: [phone] 05/14/2023, 12:57 PM  Clinical Narrative:    CM contacted [facility], ([email or web address] list placed on chart). Wife states he will need a bariatric 3n1 bedside commode.  Will order DME close to dc  Will need HH orders for HHPT with [facility].     Expected Discharge Plan: Home w Home Health Services Barriers to Discharge: Continued Medical Work up  Expected Discharge Plan and Services   Discharge Planning Services: CM Consult Post Acute Care Choice: IP Rehab Living arrangements for the past 2 months: Single Family Home                             HH [facility]: [facility] Date HH Agency Contacted: 05/14/23 Time HH Agency Contacted: 1257 Representative spoke with at [facility] Agency: Adele Dan   Social Determinants of Health (SDOH) Interventions SDOH Screenings   Food Insecurity: No Food Insecurity (04/30/2023)  Housing: Low Risk  (04/30/2023)  Transportation Needs: No Transportation Needs (04/30/2023)  Utilities: Not At Risk (04/30/2023)  Financial Resource Strain: Low Risk  (04/20/2023)  Social Connections: Unknown (10/28/2021)   [facility]  Tobacco Use: Low Risk  (05/03/2023)    Readmission Risk Interventions     No data to display

## 2023-05-14 NOTE — Progress Notes (Signed)
ProgressHeartMate 3 Rounding Note Status post implantation of HeartMate 3 May 03, 2023  Subjective:   Postop day 11 HeartMate 3 implant  Patient sitting up in chair, feels much better today with less orthostasis.  He is on low-dose milrinone and his coox this a.m. is 58%.  Hemoglobin stable at 7.8.    Currently sitting up in bed, sinus rhythm feeling somewhat fatigued.  Weight is down to 304 pounds  Speed remains at 6000 RPM with VAD flows  5.7L/min INR now 1.8 , orders per pharmacy   Exit site of the driveline is clean and dry. LVAD INTERROGATION:  HeartMate II LVAD:  Flow 5.6 liters/min, speed 6000, power 4.2, PI 1.1.  Controller intact.   Objective:    Vital Signs:   Temp:  [98 F (36.7 C)-98.2 F (36.8 C)] 98.2 F (36.8 C) (11/25 0743) Pulse Rate:  [72-81] 72 (11/25 0440) Resp:  [18-21] 18 (11/25 0743) BP: (81-116)/(58-101) 93/61 (11/25 0743) SpO2:  [90 %-94 %] 94 % (11/25 0440) Weight:  [141.1 kg] 141.1 kg (11/25 0402) Last BM Date : 05/13/23 Mean arterial Pressure 80-85  Intake/Output:   Intake/Output Summary (Last 24 hours) at 05/14/2023 0904 Last data filed at 05/14/2023 0500 Gross per 24 hour  Intake --  Output 1650 ml  Net -1650 ml     Physical Exam: General:  Well appearing. No resp difficulty    Sternal incision dry, clean. VAD tunnel dressing dry HEENT: normal Neck: supple. JVP . Carotids no bruits. No lymphadenopathy or thryomegaly appreciated. Cor: Mechanical heart sounds with LVAD hum present. Lungs: clear Abdomen: soft, nontender, nondistended. No hepatosplenomegaly. No bruits or masses. Good bowel sounds. Extremities: no cyanosis, clubbing, rash, edema Neuro: alert & orientedx3, cranial nerves grossly intact. moves all 4 extremities w/o difficulty. Affect pleasant  Telemetry: A-V sequentially paced at 80 bpm  Labs: Basic Metabolic Panel: Recent Labs  Lab 05/08/23 0450 05/09/23 0414 05/09/23 1334 05/10/23 0444 05/10/23 1626  05/11/23 0352 05/11/23 1745 05/12/23 0415 05/13/23 0536 05/14/23 0415  NA 134* 134*   < > 132*   < > 131* 132* 133* 130* 129*  K 3.5 2.9*   < > 3.5   < > 4.0 3.9 4.1 3.3* 3.9  CL 95* 93*   < > 91*   < > 91* 92* 91* 91* 93*  CO2 29 33*   < > 34*   < > 28 30 32 29 27  GLUCOSE 127* 120*   < > 89   < > 114* 209* 91 95 133*  BUN 15 11   < > 12   < > 14 17 18 15 12   CREATININE 1.04 1.05   < > 0.92   < > 1.13 1.12 1.15 1.10 1.00  CALCIUM 8.6* 8.3*   < > 8.1*   < > 7.9* 8.0* 8.2* 7.8* 8.0*  MG 2.0 1.9  --  2.1  --  2.1  --   --   --   --   PHOS 3.1 3.7  --  4.0  --   --   --   --   --   --    < > = values in this interval not displayed.    Liver Function Tests: Recent Labs  Lab 05/08/23 0450  AST 44*  ALT 21  ALKPHOS 81  BILITOT 1.3*  PROT 5.8*  ALBUMIN 2.7*   No results for input(s): "LIPASE", "AMYLASE" in the last 168 hours. No results for input(s): "AMMONIA" in the  last 168 hours.  CBC: Recent Labs  Lab 05/08/23 0450 05/09/23 0414 05/10/23 0444 05/11/23 0352 05/12/23 0415 05/13/23 0536 05/14/23 0415  WBC 11.3* 13.2* 9.3 10.1 11.1* 14.4* 15.0*  NEUTROABS 8.5* 10.7* 6.9  --   --   --   --   HGB 8.1* 8.0* 8.3* 8.5* 9.1* 7.8* 7.6*  HCT 24.4* 24.3* 25.5* 26.8* 29.2* 24.2* 24.6*  MCV 98.0 97.2 97.3 98.2 97.7 97.2 98.8  PLT 188 183 231 262 290 320 335    INR: Recent Labs  Lab 05/10/23 0444 05/11/23 0352 05/12/23 0415 05/13/23 0536 05/14/23 0415  INR 2.1* 2.0* 1.7* 1.6* 1.7*    Other results: EKG:   Imaging: No results found.   Medications:     Scheduled Medications:  allopurinol  100 mg Oral Daily   amiodarone  200 mg Oral BID   aspirin EC  81 mg Oral Daily   bisacodyl  10 mg Oral Daily   Or   bisacodyl  10 mg Rectal Daily   Chlorhexidine Gluconate Cloth  6 each Topical Daily   escitalopram  10 mg Oral QHS   ezetimibe  10 mg Oral Daily   Fe Fum-Vit C-Vit B12-FA  1 capsule Oral QPC breakfast   gabapentin  400 mg Oral TID   insulin aspart  0-24  Units Subcutaneous TID WC & HS   insulin detemir  20 Units Subcutaneous BID   levothyroxine  25 mcg Oral Daily   mexiletine  250 mg Oral Q12H   rosuvastatin  40 mg Oral Daily   sodium chloride flush  10-40 mL Intracatheter Q12H   sodium chloride flush  3 mL Intravenous Q12H   spironolactone  12.5 mg Oral Daily   Warfarin - Pharmacist Dosing Inpatient   Does not apply q1600    Infusions:  milrinone 0.25 mcg/kg/min (05/14/23 0011)     PRN Medications: acetaminophen, antiseptic oral rinse, dextrose, ondansetron (ZOFRAN) IV, mouth rinse, oxyCODONE, polyvinyl alcohol, sodium chloride flush   Assessment:   Morbidly obese 52 year old male with type 2 diabetes, sleep apnea and ischemic cardiomyopathy with EF of 10%.  Patient making good progress now with ambulation. Hemodynamics stable on low-dose milrinone, careful doses of Lasix.  Plan slow wean off milrinone.  Chest x-ray appears satisfactory with minimal left hemidiaphragm elevation/atelectasis at the left base.  INR is close to goal. Leave chest tube sutures for another week.  I reviewed the LVAD parameters from today, and compared the results to the patient's prior recorded data.  No programming changes were made.  The LVAD is functioning within specified parameters.  The patient performs LVAD self-test daily.  LVAD interrogation was negative for any significant power changes, alarms or PI events/speed drops.  LVAD equipment check completed and is in good working order.  Back-up equipment present.   LVAD education done on emergency procedures and precautions and reviewed exit site care.  Length of Stay: 14  Lovett Sox 05/14/2023, 9:04 AM

## 2023-05-14 NOTE — Progress Notes (Signed)
This chaplain is present for F/U spiritual care with the Pt. Pt. wife and grandchild are visiting. Pt. wife is participating in LVAD education with the Pt. This chaplain will F/U at another time.  Chaplain Stephanie Acre [phone]

## 2023-05-14 NOTE — Progress Notes (Signed)
Orthopedic Tech Progress Note Patient Details:  [place] Georgiev [facility] Jul 18, 1970 [id number]  Ortho Devices Type of Ortho Device: Radio broadcast assistant Ortho Device/Splint Location: BLE Ortho Device/Splint Interventions: Ordered, Application   Post Interventions Patient Tolerated: Well  Dalores Weger OTR/L 05/14/2023, 3:47 PM

## 2023-05-14 NOTE — Progress Notes (Addendum)
Advanced Heart Failure VAD Team Note  PCP-Cardiologist: [name], MD   Subjective:    11/14: s/p HM3 LVAD implantation.   On Milrinone 0.25. Co-ox 58.  Sitting up on the edge of the bed, eating breakfast. Feeling well this morning. No orthostasis on standing this morning. No SOB.   LVAD Interrogation HM3: Speed: 6000 Flow: 5.7 PI: 2.9 Power: 4.9. No PI events   Objective:    Vital Signs:   Temp:  [97.9 F (36.6 C)-98.2 F (36.8 C)] 98.2 F (36.8 C) (11/25 0402) Pulse Rate:  [72-81] 72 (11/25 0440) Resp:  [18-21] 21 (11/25 0440) BP: (81-116)/(58-101) 87/74 (11/25 0440) SpO2:  [90 %-94 %] 94 % (11/25 0440) Weight:  [141.1 kg] 141.1 kg (11/25 0402) Last BM Date : 05/13/23  Mean arterial Pressure 70-80s  Intake/Output:   Intake/Output Summary (Last 24 hours) at 05/14/2023 0713 Last data filed at 05/14/2023 0500 Gross per 24 hour  Intake 437 ml  Output 1650 ml  Net -1213 ml   Physical Exam    General: Well appearing. No distress on RA HEENT: neck supple.   Cardiac: JVP 8cm. Mechanical heart sounds with LVAD hum present.  Resp: Lung sounds clear and equal B/L Abdomen: Soft, non-tender, non-distended. + BS. Driveline: Dressing C/D/I. No drainage or redness. Anchor in place. Extremities: Warm and dry. No rash, cyanosis. +2 peripherial edema. RUE PICC Neuro: Alert and oriented x3. Affect pleasant. Moves all extremities without difficulty.  Telemetry   AV paced 80 (personally reviewed)  Labs   Basic Metabolic Panel: Recent Labs  Lab 05/08/23 0450 05/09/23 0414 05/09/23 1334 05/10/23 0444 05/10/23 1626 05/11/23 0352 05/11/23 1745 05/12/23 0415 05/13/23 0536 05/14/23 0415  NA 134* 134*   < > 132*   < > 131* 132* 133* 130* 129*  K 3.5 2.9*   < > 3.5   < > 4.0 3.9 4.1 3.3* 3.9  CL 95* 93*   < > 91*   < > 91* 92* 91* 91* 93*  CO2 29 33*   < > 34*   < > 28 30 32 29 27  GLUCOSE 127* 120*   < > 89   < > 114* 209* 91 95 133*  BUN 15 11   < > 12   < >  14 17 18 15 12   CREATININE 1.04 1.05   < > 0.92   < > 1.13 1.12 1.15 1.10 1.00  CALCIUM 8.6* 8.3*   < > 8.1*   < > 7.9* 8.0* 8.2* 7.8* 8.0*  MG 2.0 1.9  --  2.1  --  2.1  --   --   --   --   PHOS 3.1 3.7  --  4.0  --   --   --   --   --   --    < > = values in this interval not displayed.    Liver Function Tests: Recent Labs  Lab 05/08/23 0450  AST 44*  ALT 21  ALKPHOS 81  BILITOT 1.3*  PROT 5.8*  ALBUMIN 2.7*   No results for input(s): "LIPASE", "AMYLASE" in the last 168 hours. No results for input(s): "AMMONIA" in the last 168 hours.  CBC: Recent Labs  Lab 05/08/23 0450 05/09/23 0414 05/10/23 0444 05/11/23 0352 05/12/23 0415 05/13/23 0536 05/14/23 0415  WBC 11.3* 13.2* 9.3 10.1 11.1* 14.4* 15.0*  NEUTROABS 8.5* 10.7* 6.9  --   --   --   --   HGB 8.1* 8.0* 8.3* 8.5*  9.1* 7.8* 7.6*  HCT 24.4* 24.3* 25.5* 26.8* 29.2* 24.2* 24.6*  MCV 98.0 97.2 97.3 98.2 97.7 97.2 98.8  PLT 188 183 231 262 290 320 335    INR: Recent Labs  Lab 05/10/23 0444 05/11/23 0352 05/12/23 0415 05/13/23 0536 05/14/23 0415  INR 2.1* 2.0* 1.7* 1.6* 1.7*    Other results:   Imaging   No results found.  Medications:    Scheduled Medications:  allopurinol  100 mg Oral Daily   amiodarone  200 mg Oral BID   aspirin EC  81 mg Oral Daily   bisacodyl  10 mg Oral Daily   Or   bisacodyl  10 mg Rectal Daily   Chlorhexidine Gluconate Cloth  6 each Topical Daily   escitalopram  10 mg Oral QHS   ezetimibe  10 mg Oral Daily   Fe Fum-Vit C-Vit B12-FA  1 capsule Oral QPC breakfast   gabapentin  400 mg Oral TID   insulin aspart  0-24 Units Subcutaneous TID WC & HS   insulin detemir  20 Units Subcutaneous BID   levothyroxine  25 mcg Oral Daily   mexiletine  250 mg Oral Q12H   rosuvastatin  40 mg Oral Daily   sodium chloride flush  10-40 mL Intracatheter Q12H   sodium chloride flush  3 mL Intravenous Q12H   spironolactone  12.5 mg Oral Daily   Warfarin - Pharmacist Dosing Inpatient   Does  not apply q1600    Infusions:  milrinone 0.25 mcg/kg/min (05/14/23 0011)     PRN Medications: acetaminophen, antiseptic oral rinse, dextrose, ondansetron (ZOFRAN) IV, mouth rinse, oxyCODONE, polyvinyl alcohol, sodium chloride flush  Patient Profile   Joe [name] is a 52 y.o. male with h/o morbid obesity, DM2, HTN, HL, OSA, CAD and systolic HF.  Now s/p HM3 LVAD Insertion 11/14 by Dr. Donata Clay.  Assessment/Plan:    1.  Chronic Systolic HF s/p HM3 Insertion 40/98 - due to iCM - Echo 05/01/23: EF 20%, RV moderately down (improved) - R/LHC (9/24): stable 3v CAD, elevated biventricular filling pressures with low PAPi; CI 2.2 - S/p HM III LVAD 05/03/23 by Dr. Maren Beach - Intra-op TEE - EF 20-25%, RV moderately reduced, IV septum with mild rightward bowing, AV opens intermittently - Limited Echo 11/18: LV decompressed, RV appeared dilated but difficult windows.  - Limited Echo 11/21: ?RV function not well visualized - POCUS echo yesterday by Dr. Gala Romney, RV didn't look too bad (limited images)  - Milrinone 0.25 mcg/kg/min. Co-ox 58 - Holding lasix with RV failure. Appears overloaded on exam. Weight remains up 5lbs. Will continue TED hose and spiro. Diureses after RAMP study - Start Digoxin 0.125 today - INR 1.7  Discussed warfarin dosing with PharmD personally. - RAMP echo today  2. H/o VT Arrest  Hx PVCs - had VT arrest x 2 in 10/24 - Continue Amio 200 mg bid and Mexiletine 250 mg bid   3. CAD with chronic stable angina - s/p multiple stents to LAD and LCX - LHC (9/24) with stable 3v CAD - No s/s angina - Continue ASA + Crestor 40mg  daily  4. AKI  - Resolved   5. DM2 - A1c 7.2 (1/24) - SSI   6. OSA - Not using CPAP, says machine was recalled a while ago. Needs new machine. - Followed outpatient   7. Hypokalemia - K 3.9 supp  Length of Stay: 14  [place] Kailan Laws, NP 05/14/2023, 7:13 AM  VAD Team --- VAD ISSUES ONLY--- Pager [phone] (  7am - 7am)  Advanced  Heart Failure Team  Pager [phone] (M-F; 7a - 5p)  Please contact [facility] for night-coverage after hours (5p -7a ) and weekends on [email or web address]

## 2023-05-14 NOTE — Progress Notes (Signed)
PHARMACY - ANTICOAGULATION CONSULT NOTE  Pharmacy Consult for warfarin Indication: LVAD HM3 implanted 11/14  Allergies  Allergen Reactions   Tramadol Other (See Comments)    Hallucinations    Patient Measurements: Height: 6\' 3"  (190.5 cm) Weight: (!) 141.1 kg (311 lb 1.1 oz) IBW/kg (Calculated) : 84.5   Vital Signs: Temp: 98.2 F (36.8 C) (11/25 0743) Temp Source: Oral (11/25 0743) BP: 93/61 (11/25 0743) Pulse Rate: 72 (11/25 0440)  Labs: Recent Labs    05/12/23 0415 05/13/23 0536 05/14/23 0415  HGB 9.1* 7.8* 7.6*  HCT 29.2* 24.2* 24.6*  PLT 290 320 335  LABPROT 20.5* 19.4* 19.9*  INR 1.7* 1.6* 1.7*  CREATININE 1.15 1.10 1.00    Estimated Creatinine Clearance: 130.9 mL/min (by C-G formula based on SCr of 1 mg/dL).   Medical History: Past Medical History:  Diagnosis Date   Abnormal EKG    Acute systolic heart failure (HCC) 09/06/2017   Angina pectoris (HCC) 09/05/2017   Body mass index 45.0-49.9, adult (HCC)    CAD S/P percutaneous coronary angioplasty 09/07/2017   mLAD and dLAD PCI with DES 09/06/17   CHF (congestive heart failure) (HCC)    Chronic systolic (congestive) heart failure (HCC) 03/19/2019   Complication of anesthesia 1995   "had hard time waking up; breathing w/vasectomy"   Coronary artery disease    Erectile dysfunction    Essential hypertension, benign    Fatigue    GERD (gastroesophageal reflux disease)    Gout    High cholesterol    "just started tx yesterday" (09/06/2017)   History of gout    "RX prn" (09/06/2017)   Ischemic cardiomyopathy 09/07/2017   EF 25-35%   Muscular chest pain    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   Below Left Nare (MOH's)   Obesity    Obstructive sleep apnea    OSA on CPAP    Plantar fasciitis    Pre-operative clearance 02/11/2018   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/02/2019   Saint Jude   Right bundle branch block    Shortness of breath 09/05/2017    Testosterone deficiency    Type 2 diabetes mellitus with complication, without long-term current use of insulin (HCC) 09/05/2017   Type II diabetes mellitus (HCC)    "started tx 08/28/2017"    Assessment: Joe [name] with heart failure s/p LVAD implant HM3 11/14. INR slightly elevated at baseline (1.6).   INR 1.7 is subtherapeutic with slight uptrend after 5 mg daily x2 days. Hgb 7.6 (low stable), pltc 210, LDH 210 (stable). No s/sx of bleeding per RN. Eating 100% of meals per chart. No new DDIs noted (continues on amiodarone 200 mg BID).  Goal of Therapy:  INR 2-2.5 Monitor platelets by anticoagulation protocol: Yes   Plan:  Warfarin 5 mg x1 tonight Daily PT/INR Monitor s/s bleeding   Thank you for allowing pharmacy to participate in this patient's care,  Ernestene Kiel, PharmD PGY1 Pharmacy Resident  Please check AMION for all [facility] phone numbers After 10:00 PM, call Main Pharmacy [phone] 05/14/2023  8:10 AM

## 2023-05-14 NOTE — Progress Notes (Signed)
Inpatient Rehab Admissions Coordinator:   Met with patient at bedside.  He is doing exceptionally well with mobility and is hoping to d/c directly home when medically cleared.  Plan to f/u with outpatient therapy.  No further CIR needs at this time.   Estill Dooms, PT, DPT Admissions Coordinator [phone] 05/14/23  11:42 AM

## 2023-05-14 NOTE — Progress Notes (Signed)
Physical Therapy Treatment/ discharge Patient Details Name: Joe [name] MRN: [id number] DOB: [birth year] Today's Date: 05/14/2023   History of Present Illness 52 yo male admitted 04/30/23 for optimization with Heartmate III LVAD placed 11/14.  PMH: morbid obesity, DM2, HTN, HLD, OSA, CAD and systolic HF (EF 20-25%)    PT Comments  Pt very pleasant, in chair on arrival and able to perform bed mobility without physical assist end of session. Pt walking 800' with EVA without need for cues or physical assist. Pt stating all sternal precautions, contents of back up bag and performing power transitions without assist. Min assist to don/doff holster due to precautions and lines. Pt has met all acute goals and will defer to cardiac rehab and OPPT. PT in agreement will sign off.    HR 80 Speed 6000 Flow 5.6 PI 3.5 Power 4.9   If plan is discharge home, recommend the following: A little help with walking and/or transfers;A little help with bathing/dressing/bathroom;Assist for transportation;Assistance with cooking/housework   Can travel by private vehicle        Equipment Recommendations  None recommended by PT    Recommendations for Other Services       Precautions / Restrictions Precautions Precautions: Sternal;Other (comment) Precaution Comments: LVAD     Mobility  Bed Mobility Overal bed mobility: Modified Independent Bed Mobility: Supine to Sit, Sit to Supine           General bed mobility comments: use of momentum with pt able to achieve supine <>sit without physical assist, supervision for lines    Transfers Overall transfer level: Modified independent                      Ambulation/Gait Ambulation/Gait assistance: Modified independent (Device/Increase time) Gait Distance (Feet): 800 Feet Assistive device: Fara Boros Gait Pattern/deviations: Step-through pattern, Decreased stride length   Gait velocity interpretation: 1.31 - 2.62 ft/sec,  indicative of limited community ambulator   General Gait Details: safe and controlled speed with use of EVA walker   Stairs             Wheelchair Mobility     Tilt Bed    Modified Rankin (Stroke Patients Only)       Balance Overall balance assessment: Needs assistance Sitting-balance support: Feet supported, No upper extremity supported Sitting balance-Leahy Scale: Good     Standing balance support: No upper extremity supported, Bilateral upper extremity supported Standing balance-Leahy Scale: Fair Standing balance comment: EVA in standing, can static stand without support                            Cognition Arousal: Alert Behavior During Therapy: WFL for tasks assessed/performed Overall Cognitive Status: Within Functional Limits for tasks assessed                                          Exercises      General Comments        Pertinent Vitals/Pain Pain Assessment Pain Assessment: No/denies pain    Home Living                          Prior Function            PT Goals (current goals can now be found in the care plan section) Progress  towards PT goals: Goals met/education completed, patient discharged from PT (will defer to next venue)    Frequency           PT Plan      Co-evaluation              AM-PAC PT "6 Clicks" Mobility   Outcome Measure  Help needed turning from your back to your side while in a flat bed without using bedrails?: None Help needed moving from lying on your back to sitting on the side of a flat bed without using bedrails?: None Help needed moving to and from a bed to a chair (including a wheelchair)?: None Help needed standing up from a chair using your arms (e.g., wheelchair or bedside chair)?: None Help needed to walk in hospital room?: None Help needed climbing 3-5 steps with a railing? : A Lot 6 Click Score: 22    End of Session   Activity Tolerance: Patient  tolerated treatment well Patient left: with call bell/phone within reach;in bed Nurse Communication: Mobility status PT Visit Diagnosis: Other abnormalities of gait and mobility (R26.89);Muscle weakness (generalized) (M62.81)     Time: 8469-6295 PT Time Calculation (min) (ACUTE ONLY): 29 min  Charges:    $Gait Training: 8-22 mins $Therapeutic Activity: 8-22 mins PT General Charges $$ ACUTE PT VISIT: 1 Visit                     Merryl Hacker, PT Acute Rehabilitation Services Office: [phone]    Enedina Finner Elery Cadenhead 05/14/2023, 1:55 PM

## 2023-05-14 NOTE — Progress Notes (Addendum)
LVAD Coordinator Rounding Note:   Admitted 04/30/23 due to for optimization prior to VAD.    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   Pt sitting up in recliner on my arrival. States he is feeling good this morning. Encouraged continued IS use, and to be OOB as much as possible. He verbalized understanding.   Hgb trending down- 7.6 today. No bleeding noted.   Spoke with St Jude rep and he will plan to turn on therapies.   VAD Education completed 05/11/23 with pt and wife Lenore at bedside. Please see separate note for details. Home equipment has been ordered.    Vital signs: Temp: 98.4 HR: 82 Doppler Pressure: 82 Automatic BP: 93/61 (72) O2 Sat: 95% RA Wt:316.4>305.5>317.9>315.7>313>304.2>311.1 lbs     LVAD interrogation reveals:  Speed: 6000 Flow: 5.8 Power: 4.9 w PI: 3.3  Alarms: none Events: none Hematocrit: 25  Fixed speed: 6000 Low speed limit: 5700   Drive Line: Dressing change performed by pt's wife with VAD coordinator supervision. Existing VAD dressing removed and site care performed using sterile technique by wife Lenore observered by VAD coordinator. Drive line exit site cleaned with Chlora prep applicators x 2, rinsed with sterile saline and allowed to dry. Silver strip applied flush with exit site. Exit site is unincorporated, the velour is fully implanted at exit site with 1 suture in place. Moderate amount  serosanguinous drainage. Slight redness, no tenderness, foul odor or rash noted. Drive line anchor re-applied. Continue daily dressing changes by nurse champion or VAD Coordinator. Next dressing change 05/15/23.          Labs:  LDH trend: 275>251>260>247>251>238>210   Hgb trend: 9.9>8>8.1>8>8.3>8.5>7.6   INR trend: 1.3>1.4>2.0>2.6>2.1>2.0>1.7   Anticoagulation Plan: -INR Goal: 2.0-2.5 -ASA Dose: 81 mg stop date of 06/02/23   Blood Products:  Intra-op- 6 FFP 1 PLT 2 CYRO   05/05/23>>1  u/PRBC   Device: -St Jude BiV -Therapies: OFF   Arrythmias:    Respiratory: Extubated 05/04/23   Infection:    Renal:  -BUN/CRT: 16/1.42>21/1.14>15/1.04>11/1.05>14/.87>14/1.13>1.0   Gtts: Milrinone 0.25 mcg/kg/min    VAD Education: Discharge teaching completed 05/11/23 See separate note for details Ongoing drive line dressing change teaching with pt's wife  Adverse Events on VAD:   Plan/Recommendations:  1. Page VAD coordinators for any equipment or drive line issues 2. Daily drive line dressing changes per nurse champion or VAD Coordinator   Alyce Pagan RN VAD Coordinator  Office: [phone]  24/7 Pager: [phone]

## 2023-05-15 DIAGNOSIS — I509 Heart failure, unspecified: Secondary | ICD-10-CM | POA: Diagnosis not present

## 2023-05-15 DIAGNOSIS — I255 Ischemic cardiomyopathy: Secondary | ICD-10-CM | POA: Diagnosis not present

## 2023-05-15 DIAGNOSIS — I5023 Acute on chronic systolic (congestive) heart failure: Secondary | ICD-10-CM | POA: Diagnosis not present

## 2023-05-15 DIAGNOSIS — Z95811 Presence of heart assist device: Secondary | ICD-10-CM | POA: Diagnosis not present

## 2023-05-15 LAB — PROTIME-INR
INR: 1.6 — ABNORMAL HIGH (ref 0.8–1.2)
Prothrombin Time: 19.1 s — ABNORMAL HIGH (ref 11.4–15.2)

## 2023-05-15 LAB — COOXEMETRY PANEL
Carboxyhemoglobin: 2.4 % — ABNORMAL HIGH (ref 0.5–1.5)
Carboxyhemoglobin: 1.2 % (ref 0.5–1.5)
Methemoglobin: <0.7 % (ref 0.0–1.5)
Methemoglobin: <0.7 % (ref 0.0–1.5)
O2 Saturation: 62 %
O2 Saturation: 49 %
Total hemoglobin: 8.2 g/dL — ABNORMAL LOW (ref 12.0–16.0)
Total hemoglobin: 8.8 g/dL — ABNORMAL LOW (ref 12.0–16.0)

## 2023-05-15 LAB — BASIC METABOLIC PANEL
Anion gap: 8 (ref 5–15)
BUN: 15 mg/dL (ref 6–20)
CO2: 27 mmol/L (ref 22–32)
Calcium: 7.7 mg/dL — ABNORMAL LOW (ref 8.9–10.3)
Chloride: 95 mmol/L — ABNORMAL LOW (ref 98–111)
Creatinine, Ser: 1.03 mg/dL (ref 0.61–1.24)
GFR, Estimated: >60 mL/min (ref >60)
Glucose, Bld: 171 mg/dL — ABNORMAL HIGH (ref 70–99)
Potassium: 3.5 mmol/L (ref 3.5–5.1)
Sodium: 130 mmol/L — ABNORMAL LOW (ref 135–145)

## 2023-05-15 LAB — CBC
HCT: 23.5 % — ABNORMAL LOW (ref 39.0–52.0)
Hemoglobin: 7.6 g/dL — ABNORMAL LOW (ref 13.0–17.0)
MCH: 31.9 pg (ref 26.0–34.0)
MCHC: 32.3 g/dL (ref 30.0–36.0)
MCV: 98.7 fL (ref 80.0–100.0)
Platelets: 359 10*3/uL (ref 150–400)
RBC: 2.38 MIL/uL — ABNORMAL LOW (ref 4.22–5.81)
RDW: 17.8 % — ABNORMAL HIGH (ref 11.5–15.5)
WBC: 14.1 10*3/uL — ABNORMAL HIGH (ref 4.0–10.5)
nRBC: 0.2 % (ref 0.0–0.2)

## 2023-05-15 LAB — GLUCOSE, CAPILLARY
Glucose-Capillary: 138 mg/dL — ABNORMAL HIGH (ref 70–99)
Glucose-Capillary: 111 mg/dL — ABNORMAL HIGH (ref 70–99)
Glucose-Capillary: 122 mg/dL — ABNORMAL HIGH (ref 70–99)
Glucose-Capillary: 171 mg/dL — ABNORMAL HIGH (ref 70–99)
Glucose-Capillary: 226 mg/dL — ABNORMAL HIGH (ref 70–99)

## 2023-05-15 LAB — LACTATE DEHYDROGENASE: LDH: 222 U/L — ABNORMAL HIGH (ref 98–192)

## 2023-05-15 MED ORDER — FUROSEMIDE 10 MG/ML IJ SOLN
40.0000 mg | Freq: Two times a day (BID) | INTRAMUSCULAR | Status: DC
  Administered 2023-05-15 (×2): 40 mg via INTRAVENOUS
  Filled 2023-05-15 (×2): qty 4

## 2023-05-15 MED ORDER — WARFARIN SODIUM 7.5 MG PO TABS
7.5000 mg | ORAL_TABLET | Freq: Once | ORAL | Status: AC
  Administered 2023-05-15: 7.5 mg via ORAL
  Filled 2023-05-15: qty 1

## 2023-05-15 MED ORDER — SPIRONOLACTONE 25 MG PO TABS
25.0000 mg | ORAL_TABLET | Freq: Every day | ORAL | Status: DC
  Administered 2023-05-15 – 2023-05-21 (×7): 25 mg via ORAL
  Filled 2023-05-15 (×7): qty 1

## 2023-05-15 MED ORDER — POTASSIUM CHLORIDE CRYS ER 20 MEQ PO TBCR
40.0000 meq | EXTENDED_RELEASE_TABLET | ORAL | Status: AC
  Administered 2023-05-15: 40 meq via ORAL
  Filled 2023-05-15: qty 2

## 2023-05-15 MED ORDER — POTASSIUM CHLORIDE CRYS ER 20 MEQ PO TBCR
40.0000 meq | EXTENDED_RELEASE_TABLET | Freq: Once | ORAL | Status: AC
  Administered 2023-05-15: 40 meq via ORAL
  Filled 2023-05-15: qty 2

## 2023-05-15 NOTE — Progress Notes (Signed)
Advanced Heart Failure VAD Team Note  PCP-Cardiologist: [name], MD   Subjective:    11/14: s/p HM3 LVAD implantation.   On Milrinone 0.25. Co-ox 62. Lasix 40 IV BID today.    Sitting up eating breakfast. Feels well this morning. Ambulating frequently. No SOB. No CP.  LVAD Interrogation HM3: Speed: 6000    Flow: 5.5    PI: 3.3    Power: 4.8    No PI events   Objective:    Vital Signs:   Temp:  [97.8 F (36.6 C)-98.5 F (36.9 C)] 98.5 F (36.9 C) (11/26 0431) Pulse Rate:  [77-93] 88 (11/26 0431) Resp:  [17-27] 20 (11/26 0431) BP: (93-109)/(53-88) 95/69 (11/26 0431) SpO2:  [93 %-95 %] 93 % (11/25 1613) Weight:  [143.5 kg] 143.5 kg (11/26 0431) Last BM Date : 05/13/23  Mean arterial Pressure 70-90 Doppler: 84  Intake/Output:   Intake/Output Summary (Last 24 hours) at 05/15/2023 0728 Last data filed at 05/15/2023 0432 Gross per 24 hour  Intake 360 ml  Output 2020 ml  Net -1660 ml   Physical Exam    CVP 11-12 General: Sitting on edge of bed. Well appearing. No distress on RA HEENT: neck supple.  Cardiac: JVP 10cm. Mechanical heart sounds with LVAD hum present.  Resp: Lung sounds clear and equal B/L Abdomen: Soft, non-tender, non-distended. + BS. Driveline: Dressing C/D/I. No drainage or redness. Anchor in place. Extremities: Warm and dry. No rash, cyanosis. 2+ edema. + UNNA boots. RUE PICC Neuro: Alert and oriented x3. Affect pleasant. Moves all extremities without difficulty.  Telemetry   AV paced 80 (personally reviewed)  Labs   Basic Metabolic Panel: Recent Labs  Lab 05/09/23 0414 05/09/23 1334 05/10/23 0444 05/10/23 1626 05/11/23 0352 05/11/23 1745 05/12/23 0415 05/13/23 0536 05/14/23 0415 05/15/23 0247  NA 134*   < > 132*   < > 131* 132* 133* 130* 129* 130*  K 2.9*   < > 3.5   < > 4.0 3.9 4.1 3.3* 3.9 3.5  CL 93*   < > 91*   < > 91* 92* 91* 91* 93* 95*  CO2 33*   < > 34*   < > 28 30 32 29 27 27   GLUCOSE 120*   < > 89   < > 114*  209* 91 95 133* 171*  BUN 11   < > 12   < > 14 17 18 15 12 15   CREATININE 1.05   < > 0.92   < > 1.13 1.12 1.15 1.10 1.00 1.03  CALCIUM 8.3*   < > 8.1*   < > 7.9* 8.0* 8.2* 7.8* 8.0* 7.7*  MG 1.9  --  2.1  --  2.1  --   --   --   --   --   PHOS 3.7  --  4.0  --   --   --   --   --   --   --    < > = values in this interval not displayed.    Liver Function Tests: No results for input(s): "AST", "ALT", "ALKPHOS", "BILITOT", "PROT", "ALBUMIN" in the last 168 hours.  No results for input(s): "LIPASE", "AMYLASE" in the last 168 hours. No results for input(s): "AMMONIA" in the last 168 hours.  CBC: Recent Labs  Lab 05/09/23 0414 05/10/23 0444 05/11/23 0352 05/12/23 0415 05/13/23 0536 05/14/23 0415 05/15/23 0247  WBC 13.2* 9.3 10.1 11.1* 14.4* 15.0* 14.1*  NEUTROABS 10.7* 6.9  --   --   --   --   --  HGB 8.0* 8.3* 8.5* 9.1* 7.8* 7.6* 7.6*  HCT 24.3* 25.5* 26.8* 29.2* 24.2* 24.6* 23.5*  MCV 97.2 97.3 98.2 97.7 97.2 98.8 98.7  PLT 183 231 262 290 320 335 359    INR: Recent Labs  Lab 05/11/23 0352 05/12/23 0415 05/13/23 0536 05/14/23 0415 05/15/23 0247  INR 2.0* 1.7* 1.6* 1.7* 1.6*    Other results:   Imaging   ECHOCARDIOGRAM LIMITED  Result Date: 05/14/2023    ECHOCARDIOGRAM LIMITED REPORT   Patient Name:   AXXEL MASTIN [facility] Date of Exam: 05/14/2023 Medical Rec #:  [id number]              Height:       75.0 in Accession #:    [id number]             Weight:       311.1 lb Date of Birth:  1970-12-31               BSA:          2.648 m Patient Age:    52 years               BP:           0/0 mmHg Patient Gender: M                      HR:           96 bpm. Exam Location:  Inpatient Procedure: Limited Echo Indications:    LVAD evaluation Z95.811  History:        Patient has prior history of Echocardiogram examinations, most                 recent 05/10/2023. CHF and Cardiomyopathy, CAD; Arrythmias:RBBB.  Sonographer:    Dondra Prader RVT RCS Referring Phys: [phone] ADITYA  Gasper Lloyd  Sonographer Comments: Technically challenging study due to limited acoustic windows and Technically difficult study due to poor echo windows. IMPRESSIONS  1. LVAD ramp echo. LV EF < 20%. Speed initially 6000 rpm, increased to 6200 rpm then decreased to 6100 rpm. The aortic valve did not appear to open at 6000 rpm. Difficult to comment on images as quite poor. FINDINGS  Left Ventricle: LVAD ramp echo. LV EF < 20%. Speed initially 6000 rpm, increased to 6200 rpm then decreased to 6100 rpm. The aortic valve did not appear to open at 6000 rpm. Difficult to comment on images as quite poor. Dalton McleanMD Electronically signed by Wilfred Lacy Signature Date/Time: 05/14/2023/4:48:39 PM    Final    DG Chest 2 View  Result Date: 05/14/2023 CLINICAL DATA:  Shortness of breath.  Left pleural effusion. EXAM: CHEST - 2 VIEW COMPARISON:  May 10, 2023. FINDINGS: Stable cardiomediastinal silhouette. Left-sided defibrillator is unchanged. Left ventricular assist device is unchanged. Sided PICC line is unchanged. Left basilar opacity is not well visualized currently due to overlying pacemaker device. IMPRESSION: Stable support apparatus. Left basilar opacity is not well visualized currently due to overlying pacemaker device. Electronically Signed   By: Lupita Raider M.D.   On: 05/14/2023 11:32    Medications:    Scheduled Medications:  allopurinol  100 mg Oral Daily   amiodarone  200 mg Oral BID   aspirin EC  81 mg Oral Daily   Chlorhexidine Gluconate Cloth  6 each Topical Daily   digoxin  0.125 mg Oral Daily   escitalopram  10 mg Oral QHS   ezetimibe  10 mg  Oral Daily   Fe Fum-Vit C-Vit B12-FA  1 capsule Oral QPC breakfast   furosemide  40 mg Intravenous BID   gabapentin  400 mg Oral TID   insulin aspart  0-24 Units Subcutaneous TID WC & HS   insulin detemir  20 Units Subcutaneous BID   levothyroxine  25 mcg Oral Daily   mexiletine  250 mg Oral Q12H   potassium chloride  40 mEq Oral Once    rosuvastatin  40 mg Oral Daily   sodium chloride flush  10-40 mL Intracatheter Q12H   sodium chloride flush  3 mL Intravenous Q12H   spironolactone  12.5 mg Oral Daily   Warfarin - Pharmacist Dosing Inpatient   Does not apply q1600    Infusions:  milrinone 0.25 mcg/kg/min (05/15/23 0603)     PRN Medications: acetaminophen, antiseptic oral rinse, dextrose, ondansetron (ZOFRAN) IV, mouth rinse, oxyCODONE, polyvinyl alcohol, sodium chloride flush  Patient Profile   Devone Batt is a 52 y.o. male with h/o morbid obesity, DM2, HTN, HL, OSA, CAD and systolic HF.  Now s/p HM3 LVAD Implantation 11/14 by Dr. Donata Clay.  Assessment/Plan:    1.  Chronic Systolic HF s/p HM3 Implantation 11/14 - due to iCM - Echo 05/01/23: EF 20%, RV moderately down (improved) - RHC (9/24): elevated biventricular filling pressures with low PAPi; CI 2.2 - S/p HM III LVAD 05/03/23 by Dr. Maren Beach - Intra-op TEE - EF 20-25%, RV moderately reduced, IV septum with mild rightward bowing, AV opens intermittently - Limited Echo 11/18: LV decompressed, RV appeared dilated but difficult windows.  - Weaned from inotropes and pressors post-op, however developed worsening RV function. Co-ox 38. Restarted on Milrinone. - RAMP echo 11/21: Speeds left at 6000, will increase to 6100 after diuresis today - Milrinone 0.25 mcg/kg/min. Co-ox 62. Wean when euvolemic. - Weight up overnight. UNNA boots in place. CVP 12. - Lasix 40 IV BID  - Increase Spiro to 25 mg  - Continue Digoxin 0.125  - INR 1.6  Warfarin d/w PharmD   2. H/o VT Arrest  Hx PVCs - had VT arrest x 2 in 10/24 - Continue Amio 200 mg bid and Mexiletine 250 mg bid   3. CAD with chronic stable angina - s/p multiple stents to LAD and LCX - LHC (9/24) with stable 3v CAD - No s/s angina - Continue ASA + Crestor 40mg  daily  4. AKI  - Resolved   5. DM2 - A1c 7.2 (1/24) - SSI   6. OSA - Not using CPAP, says machine was recalled a while ago. Needs new  machine. - Followed outpatient   7. Hypokalemia - K 3.5 supp this am - Increase Kelby Fam of Stay: 15  [place] Pualani Borah, NP 05/15/2023, 7:28 AM  VAD Team --- VAD ISSUES ONLY--- Pager [phone] (7am - 7am)  Advanced Heart Failure Team  Pager [phone] (M-F; 7a - 5p)  Please contact [facility] for night-coverage after hours (5p -7a ) and weekends on [email or web address]

## 2023-05-15 NOTE — Progress Notes (Signed)
ProgressHeartMate 3 Rounding Note Status post implantation of HeartMate 3 May 03, 2023  Subjective:   Postop day 12 HeartMate 3 implant  Patient continues to feel stronger with less orthostatic symptoms despite hemoglobin of 7.6.  Patient is on low-dose milrinone and cardiac meds, diuretics, and Coumadin dosing is being titrated by the heart failure team.  Patient examined and images of yesterday's echo VAD ramp study reviewed.  RV function appears normal well-preserved.  Speed remains at 6000 RPM with VAD flows  5.7L/min INR now 1.6 , orders per pharmacy   Exit site of the driveline is clean and dry. LVAD INTERROGATION:  HeartMate II LVAD:  Flow 5.6 liters/min, speed 6000, power 4.2, PI 1.1.  Controller intact.   Objective:    Vital Signs:   Temp:  [97.7 F (36.5 C)-98.5 F (36.9 C)] 97.7 F (36.5 C) (11/26 0739) Pulse Rate:  [77-93] 87 (11/26 0916) Resp:  [16-27] 16 (11/26 0739) BP: (93-109)/(53-88) 96/78 (11/26 0739) SpO2:  [93 %-95 %] 94 % (11/26 0739) Weight:  [143.5 kg] 143.5 kg (11/26 0431) Last BM Date : 05/15/23 Mean arterial Pressure 80-85  Intake/Output:   Intake/Output Summary (Last 24 hours) at 05/15/2023 1119 Last data filed at 05/15/2023 0432 Gross per 24 hour  Intake 360 ml  Output 2020 ml  Net -1660 ml     Physical Exam: General:  Well appearing. No resp difficulty    Sternal incision dry, clean. VAD tunnel dressing dry HEENT: normal Neck: supple. JVP . Carotids no bruits. No lymphadenopathy or thryomegaly appreciated. Cor: Mechanical heart sounds with LVAD hum present. Lungs: clear Abdomen: soft, nontender, nondistended. No hepatosplenomegaly. No bruits or masses. Good bowel sounds. Extremities: no cyanosis, clubbing, rash, edema Neuro: alert & orientedx3, cranial nerves grossly intact. moves all 4 extremities w/o difficulty. Affect pleasant  Telemetry: A-V sequentially paced at 80 bpm  Labs: Basic Metabolic Panel: Recent Labs  Lab  05/09/23 0414 05/09/23 1334 05/10/23 0444 05/10/23 1626 05/11/23 0352 05/11/23 1745 05/12/23 0415 05/13/23 0536 05/14/23 0415 05/15/23 0247  NA 134*   < > 132*   < > 131* 132* 133* 130* 129* 130*  K 2.9*   < > 3.5   < > 4.0 3.9 4.1 3.3* 3.9 3.5  CL 93*   < > 91*   < > 91* 92* 91* 91* 93* 95*  CO2 33*   < > 34*   < > 28 30 32 29 27 27   GLUCOSE 120*   < > 89   < > 114* 209* 91 95 133* 171*  BUN 11   < > 12   < > 14 17 18 15 12 15   CREATININE 1.05   < > 0.92   < > 1.13 1.12 1.15 1.10 1.00 1.03  CALCIUM 8.3*   < > 8.1*   < > 7.9* 8.0* 8.2* 7.8* 8.0* 7.7*  MG 1.9  --  2.1  --  2.1  --   --   --   --   --   PHOS 3.7  --  4.0  --   --   --   --   --   --   --    < > = values in this interval not displayed.    Liver Function Tests: No results for input(s): "AST", "ALT", "ALKPHOS", "BILITOT", "PROT", "ALBUMIN" in the last 168 hours.  No results for input(s): "LIPASE", "AMYLASE" in the last 168 hours. No results for input(s): "AMMONIA" in the last 168 hours.  CBC: Recent Labs  Lab 05/09/23 0414 05/10/23 0444 05/11/23 0352 05/12/23 0415 05/13/23 0536 05/14/23 0415 05/15/23 0247  WBC 13.2* 9.3 10.1 11.1* 14.4* 15.0* 14.1*  NEUTROABS 10.7* 6.9  --   --   --   --   --   HGB 8.0* 8.3* 8.5* 9.1* 7.8* 7.6* 7.6*  HCT 24.3* 25.5* 26.8* 29.2* 24.2* 24.6* 23.5*  MCV 97.2 97.3 98.2 97.7 97.2 98.8 98.7  PLT 183 231 262 290 320 335 359    INR: Recent Labs  Lab 05/11/23 0352 05/12/23 0415 05/13/23 0536 05/14/23 0415 05/15/23 0247  INR 2.0* 1.7* 1.6* 1.7* 1.6*    Other results: EKG:   Imaging: ECHOCARDIOGRAM LIMITED  Result Date: 05/14/2023    ECHOCARDIOGRAM LIMITED REPORT   Patient Name:   Joe [name] [facility] Date of Exam: 05/14/2023 Medical Rec #:  [id number]              Height:       75.0 in Accession #:    [id number]             Weight:       311.1 lb Date of Birth:  10-01-1970               BSA:          2.648 m Patient Age:    52 years               BP:           0/0  mmHg Patient Gender: M                      HR:           96 bpm. Exam Location:  Inpatient Procedure: Limited Echo Indications:    LVAD evaluation Z95.811  History:        Patient has prior history of Echocardiogram examinations, most                 recent 05/10/2023. CHF and Cardiomyopathy, CAD; Arrythmias:RBBB.  Sonographer:    Dondra Prader RVT RCS Referring Phys: [phone] ADITYA Gasper Lloyd  Sonographer Comments: Technically challenging study due to limited acoustic windows and Technically difficult study due to poor echo windows. IMPRESSIONS  1. LVAD ramp echo. LV EF < 20%. Speed initially 6000 rpm, increased to 6200 rpm then decreased to 6100 rpm. The aortic valve did not appear to open at 6000 rpm. Difficult to comment on images as quite poor. FINDINGS  Left Ventricle: LVAD ramp echo. LV EF < 20%. Speed initially 6000 rpm, increased to 6200 rpm then decreased to 6100 rpm. The aortic valve did not appear to open at 6000 rpm. Difficult to comment on images as quite poor. Dalton McleanMD Electronically signed by Wilfred Lacy Signature Date/Time: 05/14/2023/4:48:39 PM    Final    DG Chest 2 View  Result Date: 05/14/2023 CLINICAL DATA:  Shortness of breath.  Left pleural effusion. EXAM: CHEST - 2 VIEW COMPARISON:  May 10, 2023. FINDINGS: Stable cardiomediastinal silhouette. Left-sided defibrillator is unchanged. Left ventricular assist device is unchanged. Sided PICC line is unchanged. Left basilar opacity is not well visualized currently due to overlying pacemaker device. IMPRESSION: Stable support apparatus. Left basilar opacity is not well visualized currently due to overlying pacemaker device. Electronically Signed   By: Lupita Raider M.D.   On: 05/14/2023 11:32     Medications:     Scheduled Medications:  allopurinol  100 mg Oral Daily  amiodarone  200 mg Oral BID   aspirin EC  81 mg Oral Daily   Chlorhexidine Gluconate Cloth  6 each Topical Daily   digoxin  0.125 mg Oral Daily    escitalopram  10 mg Oral QHS   ezetimibe  10 mg Oral Daily   Fe Fum-Vit C-Vit B12-FA  1 capsule Oral QPC breakfast   furosemide  40 mg Intravenous BID   gabapentin  400 mg Oral TID   insulin aspart  0-24 Units Subcutaneous TID WC & HS   insulin detemir  20 Units Subcutaneous BID   levothyroxine  25 mcg Oral Daily   mexiletine  250 mg Oral Q12H   rosuvastatin  40 mg Oral Daily   sodium chloride flush  10-40 mL Intracatheter Q12H   sodium chloride flush  3 mL Intravenous Q12H   spironolactone  25 mg Oral Daily   Warfarin - Pharmacist Dosing Inpatient   Does not apply q1600    Infusions:  milrinone 0.25 mcg/kg/min (05/15/23 0603)     PRN Medications: acetaminophen, antiseptic oral rinse, dextrose, ondansetron (ZOFRAN) IV, mouth rinse, oxyCODONE, polyvinyl alcohol, sodium chloride flush   Assessment:   Morbidly obese 52 year old male with type 2 diabetes, sleep apnea and ischemic cardiomyopathy with EF of 10%.  Patient making good progress now with ambulation. Hemodynamics stable on low-dose milrinone, careful doses of Lasix.  Plan slow wean off milrinone.  Chest x-ray AP-lateral image today personally reviewed which appears satisfactory with minimal left hemidiaphragm elevation/atelectasis at the left base.  INR is close to goal. Leave chest tube sutures for another week. Reviewed the importance of avoiding having the  controller fall and place tension on the power cord. I reviewed the LVAD parameters from today, and compared the results to the patient's prior recorded data.  No programming changes were made.  The LVAD is functioning within specified parameters.  The patient performs LVAD self-test daily.  LVAD interrogation was negative for any significant power changes, alarms or PI events/speed drops.  LVAD equipment check completed and is in good working order.  Back-up equipment present.   LVAD education done on emergency procedures and precautions and reviewed exit site  care.  Length of Stay: 15  Lovett Sox 05/15/2023, 11:19 AM

## 2023-05-15 NOTE — Progress Notes (Addendum)
LVAD Coordinator Rounding Note:   Admitted 04/30/23 due to for optimization prior to VAD.    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   Pt sitting up in recliner on my arrival. States he is feeling good this morning other than his bottom is sore.   Limited RAMP echo yesterday afternoon due to difficult imaging. Left speed at 6000 at that time. Plan to decrease Milrinone and further diuresis today. Will consider increasing speed tomorrow per Dr Gasper Lloyd.   Satira Sark Jude rep turned on therapies yesterday.   VAD Education completed 05/11/23 with pt and wife Lenore at bedside. Please see separate note for details. Home equipment has been ordered.     Vital signs: Temp: 97.9 HR: 80 Doppler Pressure: 90 Automatic BP: 97/81 (88) O2 Sat: 93% RA Wt: 316.4>305.5>317.9>315.7>313>304.2>311.1>316.4 lbs     LVAD interrogation reveals:  Speed: 6000 Flow: 5.6 Power: 4.9 w PI: 3.4  Alarms: none Events: none Hematocrit: 25  Fixed speed: 6000 Low speed limit: 5700   Drive Line: Dressing change performed by pt's wife with VAD coordinator supervision/minimal verbal cues. Existing VAD dressing removed and site care performed using sterile technique by wife Lenore observered by VAD coordinator. Drive line exit site cleaned with Chlora prep applicators x 2, rinsed with sterile saline and allowed to dry. Silver strip applied flush with exit site. Exit site is unincorporated, the velour is fully implanted at exit site with 1 suture in place. Moderate amount  serosanguinous drainage. Slight redness, no tenderness, foul odor or rash noted. Drive line anchor re-applied. Continue daily dressing changes by nurse champion or VAD Coordinator. Next dressing change 05/16/23.           Labs:  LDH trend: 275>251>260>247>251>238>210>222   Hgb trend: 9.9>8>8.1>8>8.3>8.5>7.6>7.6   INR trend: 1.3>1.4>2.0>2.6>2.1>2.0>1.7>1.6   Anticoagulation Plan: -INR  Goal: 2.0-2.5 -ASA Dose: 81 mg stop date of 06/02/23   Blood Products:  Intra-op- 6 FFP 1 PLT 2 CYRO   05/05/23>>1 u/PRBC   Device: -St Jude BiV -Therapies: ON   Arrythmias:    Respiratory: Extubated 05/04/23   Infection:    Renal:  -BUN/CRT: 16/1.42>21/1.14>15/1.04>11/1.05>14/.87>14/1.13>1.0>1.03   Gtts: Milrinone 0.125 mcg/kg/min    VAD Education: Discharge teaching completed 05/11/23 See separate note for details Ongoing drive line dressing change teaching with pt's wife  Adverse Events on VAD:   Plan/Recommendations:  1. Page VAD coordinators for any equipment or drive line issues 2. Daily drive line dressing changes per nurse champion or VAD Coordinator   Alyce Pagan RN VAD Coordinator  Office: [phone]  24/7 Pager: [phone]

## 2023-05-15 NOTE — Progress Notes (Signed)
PHARMACY - ANTICOAGULATION CONSULT NOTE  Pharmacy Consult for warfarin Indication: LVAD HM3 implanted 11/14  Allergies  Allergen Reactions   Tramadol Other (See Comments)    Hallucinations    Patient Measurements: Height: 6\' 3"  (190.5 cm) Weight: (!) 143.5 kg (316 lb 6.4 oz) (Scale A) IBW/kg (Calculated) : 84.5   Vital Signs: Temp: 98.5 F (36.9 C) (11/26 0431) Temp Source: Oral (11/26 0431) BP: 95/69 (11/26 0431) Pulse Rate: 88 (11/26 0431)  Labs: Recent Labs    05/13/23 0536 05/14/23 0415 05/15/23 0247  HGB 7.8* 7.6* 7.6*  HCT 24.2* 24.6* 23.5*  PLT 320 335 359  LABPROT 19.4* 19.9* 19.1*  INR 1.6* 1.7* 1.6*  CREATININE 1.10 1.00 1.03    Estimated Creatinine Clearance: 128.3 mL/min (by C-G formula based on SCr of 1.03 mg/dL).   Medical History: Past Medical History:  Diagnosis Date   Abnormal EKG    Acute systolic heart failure (HCC) 09/06/2017   Angina pectoris (HCC) 09/05/2017   Body mass index 45.0-49.9, adult (HCC)    CAD S/P percutaneous coronary angioplasty 09/07/2017   mLAD and dLAD PCI with DES 09/06/17   CHF (congestive heart failure) (HCC)    Chronic systolic (congestive) heart failure (HCC) 03/19/2019   Complication of anesthesia 1995   "had hard time waking up; breathing w/vasectomy"   Coronary artery disease    Erectile dysfunction    Essential hypertension, benign    Fatigue    GERD (gastroesophageal reflux disease)    Gout    High cholesterol    "just started tx yesterday" (09/06/2017)   History of gout    "RX prn" (09/06/2017)   Ischemic cardiomyopathy 09/07/2017   EF 25-35%   Muscular chest pain    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   Below Left Nare (MOH's)   Obesity    Obstructive sleep apnea    OSA on CPAP    Plantar fasciitis    Pre-operative clearance 02/11/2018   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/02/2019   Saint Jude   Right bundle branch block    Shortness of breath 09/05/2017    Testosterone deficiency    Type 2 diabetes mellitus with complication, without long-term current use of insulin (HCC) 09/05/2017   Type II diabetes mellitus (HCC)    "started tx 08/28/2017"    Assessment: 52yom with heart failure s/p LVAD implant HM3 11/14. INR slightly elevated at baseline (1.6).   INR 1.6 is subtherapeutic, trending down slightly after 5 mg daily x3 days. Hgb 7.6 (low stable), pltc 359, LDH 222 (stable). No s/sx of bleeding and eating 100% of meals per RN, has some protein shakes from home in the room. No new DDIs noted (continues on amiodarone 200 mg BID).  Goal of Therapy:  INR 2-2.5 Monitor platelets by anticoagulation protocol: Yes   Plan:  Warfarin 7.5 mg x1 tonight Daily PT/INR Monitor s/s bleeding   Thank you for allowing pharmacy to participate in this patient's care,  Ernestene Kiel, PharmD PGY1 Pharmacy Resident  Please check AMION for all [facility] phone numbers After 10:00 PM, call Main Pharmacy [phone] 05/15/2023  6:25 AM

## 2023-05-15 NOTE — Plan of Care (Signed)
Problem: Clinical Measurements: Goal: Will remain free from infection Outcome: Progressing Goal: Diagnostic test results will improve Outcome: Progressing   Problem: Activity: Goal: Risk for activity intolerance will decrease Outcome: Progressing   Problem: Coping: Goal: Level of anxiety will decrease Outcome: Progressing   Problem: Pain Management: Goal: General experience of comfort will improve Outcome: Progressing   Problem: Safety: Goal: Ability to remain free from injury will improve Outcome: Progressing   Problem: Skin Integrity: Goal: Risk for impaired skin integrity will decrease Outcome: Progressing

## 2023-05-15 NOTE — Progress Notes (Signed)
Occupational Therapy Treatment Patient Details Name: Joe [name] MRN: [id number] DOB: [birth year] Today's Date: 05/15/2023   History of present illness 52 yo male admitted 04/30/23 for optimization with Heartmate III LVAD placed 11/14.  PMH: morbid obesity, DM2, HTN, HLD, OSA, CAD and systolic HF (EF 20-25%)   OT comments  Pt progressing well toward established OT goals. Per chart and pt, plan is now to go home; updated dc recommendation to no follow up as pt has been educated regarding compensatory techniques for ADL and predominantly needing to optimize activity tolerance; PT recommended OP therapy. Focus session on compensatory techniques for ADL. Will continue to follow acutely.       If plan is discharge home, recommend the following:  A little help with walking and/or transfers;A lot of help with bathing/dressing/bathroom;Assistance with cooking/housework;Assist for transportation;Help with stairs or ramp for entrance   Equipment Recommendations  BSC/3in1 (bariatric)    Recommendations for Other Services      Precautions / Restrictions Precautions Precautions: Sternal;Other (comment) Precaution Booklet Issued: Yes (comment) Precaution Comments: LVAD Restrictions Weight Bearing Restrictions: No Other Position/Activity Restrictions: sternal precautions       Mobility Bed Mobility Overal bed mobility: Modified Independent                  Transfers Overall transfer level: Modified independent                 General transfer comment: has a RW similar to the EVA RW at home per his report     Balance Overall balance assessment: Needs assistance Sitting-balance support: Feet supported, No upper extremity supported Sitting balance-Leahy Scale: Good     Standing balance support: No upper extremity supported, Bilateral upper extremity supported Standing balance-Leahy Scale: Fair                             ADL either performed or  assessed with clinical judgement   ADL Overall ADL's : Needs assistance/impaired           Upper Body Bathing Details (indicate cue type and reason): reviewed compensatory techniques/modifications for bathing within precautions of LVAD     Upper Body Dressing : Set up;Sitting;Moderate assistance Upper Body Dressing Details (indicate cue type and reason): reviewed compensatory techniques for UB dressing to don shirt with set-up. Needing mod A for bringing LVAD battery pack harness around back     Toilet Transfer: Stand-pivot;Supervision/safety Toilet Transfer Details (indicate cue type and reason): CGA for safety Toileting- Clothing Manipulation and Hygiene: Minimal assistance;Sit to/from stand Toileting - Clothing Manipulation Details (indicate cue type and reason): reviewed compensatory techniques and pt with good initation. OT finishing secondary to pt fatigue     Functional mobility during ADLs: Supervision/safety (EVA)      Extremity/Trunk Assessment Upper Extremity Assessment Upper Extremity Assessment: Generalized weakness   Lower Extremity Assessment Lower Extremity Assessment: Defer to PT evaluation        Vision   Vision Assessment?: No apparent visual deficits   Perception Perception Perception: Not tested   Praxis Praxis Praxis: Not tested    Cognition Arousal: Alert Behavior During Therapy: [facility] for tasks assessed/performed Overall Cognitive Status: Within Functional Limits for tasks assessed                                 General Comments: continued education to implement sternal precautions functionally  Exercises      Shoulder Instructions       General Comments VSS    Pertinent Vitals/ Pain       Pain Assessment Pain Assessment: No/denies pain  Home Living                                          Prior Functioning/Environment              Frequency  Min 1X/week        Progress  Toward Goals  OT Goals(current goals can now be found in the care plan section)  Progress towards OT goals: Progressing toward goals  Acute Rehab OT Goals Patient Stated Goal: go home soon OT Goal Formulation: With patient Time For Goal Achievement: 05/22/23 Potential to Achieve Goals: Good ADL Goals Pt Will Perform Grooming: with supervision;standing Pt Will Perform Upper Body Dressing: with set-up;sitting Pt Will Perform Lower Body Dressing: with supervision;sit to/from stand Pt Will Transfer to Toilet: with contact guard assist;ambulating Additional ADL Goal #1: Pt will safely manage LVAD (switch to batteries and manage VAD bag) during ADL without cues  Plan      Co-evaluation                 AM-PAC OT "6 Clicks" Daily Activity     Outcome Measure   Help from another person eating meals?: None Help from another person taking care of personal grooming?: A Little Help from another person toileting, which includes using toliet, bedpan, or urinal?: A Little Help from another person bathing (including washing, rinsing, drying)?: A Lot Help from another person to put on and taking off regular upper body clothing?: A Little Help from another person to put on and taking off regular lower body clothing?: Total 6 Click Score: 16    End of Session Equipment Utilized During Treatment: Other (comment) (EVA)  OT Visit Diagnosis: Unsteadiness on feet (R26.81);Muscle weakness (generalized) (M62.81)   Activity Tolerance Patient tolerated treatment well   Patient Left in chair;with call bell/phone within reach   Nurse Communication Mobility status        Time: 6213-0865 OT Time Calculation (min): 47 min  Charges: OT General Charges $OT Visit: 1 Visit OT Treatments $Self Care/Home Management : 38-52 mins  Joe [name], OTR/L [facility] Acute Rehabilitation Office: [phone]   Joe [name] 05/15/2023, 12:17 PM

## 2023-05-16 DIAGNOSIS — I255 Ischemic cardiomyopathy: Secondary | ICD-10-CM | POA: Diagnosis not present

## 2023-05-16 DIAGNOSIS — Z95811 Presence of heart assist device: Secondary | ICD-10-CM | POA: Diagnosis not present

## 2023-05-16 DIAGNOSIS — I509 Heart failure, unspecified: Secondary | ICD-10-CM | POA: Diagnosis not present

## 2023-05-16 DIAGNOSIS — I5023 Acute on chronic systolic (congestive) heart failure: Secondary | ICD-10-CM | POA: Diagnosis not present

## 2023-05-16 LAB — CBC
HCT: 25.6 % — ABNORMAL LOW (ref 39.0–52.0)
Hemoglobin: 8 g/dL — ABNORMAL LOW (ref 13.0–17.0)
MCH: 30.9 pg (ref 26.0–34.0)
MCHC: 31.3 g/dL (ref 30.0–36.0)
MCV: 98.8 fL (ref 80.0–100.0)
Platelets: 381 10*3/uL (ref 150–400)
RBC: 2.59 MIL/uL — ABNORMAL LOW (ref 4.22–5.81)
RDW: 17.8 % — ABNORMAL HIGH (ref 11.5–15.5)
WBC: 10.3 10*3/uL (ref 4.0–10.5)
nRBC: 0.2 % (ref 0.0–0.2)

## 2023-05-16 LAB — COOXEMETRY PANEL
Carboxyhemoglobin: 2.6 % — ABNORMAL HIGH (ref 0.5–1.5)
Methemoglobin: 1.2 % (ref 0.0–1.5)
O2 Saturation: 47.8 %
Total hemoglobin: 8.7 g/dL — ABNORMAL LOW (ref 12.0–16.0)

## 2023-05-16 LAB — GLUCOSE, CAPILLARY
Glucose-Capillary: 114 mg/dL — ABNORMAL HIGH (ref 70–99)
Glucose-Capillary: 168 mg/dL — ABNORMAL HIGH (ref 70–99)
Glucose-Capillary: 173 mg/dL — ABNORMAL HIGH (ref 70–99)
Glucose-Capillary: 138 mg/dL — ABNORMAL HIGH (ref 70–99)

## 2023-05-16 LAB — BASIC METABOLIC PANEL
Anion gap: 12 (ref 5–15)
BUN: 15 mg/dL (ref 6–20)
CO2: 25 mmol/L (ref 22–32)
Calcium: 7.7 mg/dL — ABNORMAL LOW (ref 8.9–10.3)
Chloride: 95 mmol/L — ABNORMAL LOW (ref 98–111)
Creatinine, Ser: 1.08 mg/dL (ref 0.61–1.24)
GFR, Estimated: >60 mL/min (ref >60)
Glucose, Bld: 234 mg/dL — ABNORMAL HIGH (ref 70–99)
Potassium: 3.4 mmol/L — ABNORMAL LOW (ref 3.5–5.1)
Sodium: 132 mmol/L — ABNORMAL LOW (ref 135–145)

## 2023-05-16 LAB — LACTATE DEHYDROGENASE: LDH: 223 U/L — ABNORMAL HIGH (ref 98–192)

## 2023-05-16 LAB — PROTIME-INR
INR: 1.6 — ABNORMAL HIGH (ref 0.8–1.2)
Prothrombin Time: 18.8 s — ABNORMAL HIGH (ref 11.4–15.2)

## 2023-05-16 MED ORDER — POTASSIUM CHLORIDE CRYS ER 20 MEQ PO TBCR
40.0000 meq | EXTENDED_RELEASE_TABLET | ORAL | Status: AC
  Administered 2023-05-16 (×2): 40 meq via ORAL
  Filled 2023-05-16 (×2): qty 2

## 2023-05-16 MED ORDER — SILDENAFIL CITRATE 20 MG PO TABS
10.0000 mg | ORAL_TABLET | Freq: Three times a day (TID) | ORAL | Status: DC
  Administered 2023-05-16 (×2): 10 mg via ORAL
  Filled 2023-05-16 (×3): qty 1

## 2023-05-16 MED ORDER — SODIUM CHLORIDE 0.9% IV SOLUTION: Freq: Once | INTRAVENOUS | Status: AC

## 2023-05-16 MED ORDER — FUROSEMIDE 10 MG/ML IJ SOLN
20.0000 mg | Freq: Once | INTRAMUSCULAR | Status: AC
  Administered 2023-05-16: 20 mg via INTRAVENOUS
  Filled 2023-05-16: qty 2

## 2023-05-16 MED ORDER — FUROSEMIDE 10 MG/ML IJ SOLN
40.0000 mg | Freq: Once | INTRAMUSCULAR | Status: AC
  Administered 2023-05-16: 40 mg via INTRAVENOUS
  Filled 2023-05-16: qty 4

## 2023-05-16 MED ORDER — WARFARIN SODIUM 7.5 MG PO TABS
7.5000 mg | ORAL_TABLET | Freq: Once | ORAL | Status: AC
  Administered 2023-05-16: 7.5 mg via ORAL
  Filled 2023-05-16: qty 1

## 2023-05-16 NOTE — Progress Notes (Signed)
Advanced Heart Failure VAD Team Note  PCP-Cardiologist: [name], MD   Subjective:    11/14: s/p HM3 LVAD implantation.   Milrinone 0.125. Co-ox 48. Calculated Fick CO/CI 6.5/2.2  Sitting up eating breakfast. Feels well this morning. BLE swelling improved. No SOB.   LVAD Interrogation HM3: Speed: 6000    Flow: 5.6    PI: 3.9    Power: 4.7    5 PI events overnight (no speed drops, PI>3)  Objective:    Vital Signs:   Temp:  [97.7 F (36.5 C)-98.3 F (36.8 C)] 98.3 F (36.8 C) (11/27 0326) Pulse Rate:  [80-95] 86 (11/27 0326) Resp:  [15-20] 19 (11/27 0326) BP: (81-104)/(66-81) 93/80 (11/27 0326) SpO2:  [92 %-96 %] 94 % (11/27 0326) Weight:  [142.8 kg] 142.8 kg (11/27 0326) Last BM Date : 05/15/23  Mean arterial Pressure 70-90 Doppler: 92  Intake/Output:   Intake/Output Summary (Last 24 hours) at 05/16/2023 0721 Last data filed at 05/16/2023 0600 Gross per 24 hour  Intake 732.4 ml  Output 4150 ml  Net -3417.6 ml   Physical Exam    CVP 12 General: Sitting on edge of bed. Well appearing. No distress on RA HEENT: neck supple.  Cardiac: JVP 12cm. Mechanical heart sounds with LVAD hum present.  Resp: Lung sounds clear and equal B/L Abdomen: Soft, non-tender, non-distended. + BS. Driveline: Dressing C/D/I. No drainage or redness. Anchor in place. Extremities: Warm and dry. No rash, cyanosis.1+ edema in BL thighs. + UNNA boots. RUE PICC Neuro: Alert and oriented x3. Affect pleasant. Moves all extremities without difficulty.  Telemetry   AV paced 80 (personally reviewed)  Labs   Basic Metabolic Panel: Recent Labs  Lab 05/10/23 0444 05/10/23 1626 05/11/23 0352 05/11/23 1745 05/12/23 0415 05/13/23 0536 05/14/23 0415 05/15/23 0247  NA 132*   < > 131* 132* 133* 130* 129* 130*  K 3.5   < > 4.0 3.9 4.1 3.3* 3.9 3.5  CL 91*   < > 91* 92* 91* 91* 93* 95*  CO2 34*   < > 28 30 32 29 27 27   GLUCOSE 89   < > 114* 209* 91 95 133* 171*  BUN 12   < > 14 17  18 15 12 15   CREATININE 0.92   < > 1.13 1.12 1.15 1.10 1.00 1.03  CALCIUM 8.1*   < > 7.9* 8.0* 8.2* 7.8* 8.0* 7.7*  MG 2.1  --  2.1  --   --   --   --   --   PHOS 4.0  --   --   --   --   --   --   --    < > = values in this interval not displayed.    Liver Function Tests: No results for input(s): "AST", "ALT", "ALKPHOS", "BILITOT", "PROT", "ALBUMIN" in the last 168 hours.  No results for input(s): "LIPASE", "AMYLASE" in the last 168 hours. No results for input(s): "AMMONIA" in the last 168 hours.  CBC: Recent Labs  Lab 05/10/23 0444 05/11/23 0352 05/12/23 0415 05/13/23 0536 05/14/23 0415 05/15/23 0247  WBC 9.3 10.1 11.1* 14.4* 15.0* 14.1*  NEUTROABS 6.9  --   --   --   --   --   HGB 8.3* 8.5* 9.1* 7.8* 7.6* 7.6*  HCT 25.5* 26.8* 29.2* 24.2* 24.6* 23.5*  MCV 97.3 98.2 97.7 97.2 98.8 98.7  PLT 231 262 290 320 335 359    INR: Recent Labs  Lab 05/12/23 0415 05/13/23 0536  05/14/23 0415 05/15/23 0247 05/16/23 0335  INR 1.7* 1.6* 1.7* 1.6* 1.6*    Other results:   Imaging   ECHOCARDIOGRAM LIMITED  Result Date: 05/14/2023    ECHOCARDIOGRAM LIMITED REPORT   Patient Name:   Joe [name] [facility] Date of Exam: 05/14/2023 Medical Rec #:  [id number]              Height:       75.0 in Accession #:    [id number]             Weight:       311.1 lb Date of Birth:  [birth year]               BSA:          2.648 m Patient Age:    52 years               BP:           0/0 mmHg Patient Gender: M                      HR:           96 bpm. Exam Location:  Inpatient Procedure: Limited Echo Indications:    LVAD evaluation Z95.811  History:        Patient has prior history of Echocardiogram examinations, most                 recent 05/10/2023. CHF and Cardiomyopathy, CAD; Arrythmias:RBBB.  Sonographer:    Dondra Prader RVT RCS Referring Phys: [phone] ADITYA Gasper Lloyd  Sonographer Comments: Technically challenging study due to limited acoustic windows and Technically difficult study due to poor  echo windows. IMPRESSIONS  1. LVAD ramp echo. LV EF < 20%. Speed initially 6000 rpm, increased to 6200 rpm then decreased to 6100 rpm. The aortic valve did not appear to open at 6000 rpm. Difficult to comment on images as quite poor. FINDINGS  Left Ventricle: LVAD ramp echo. LV EF < 20%. Speed initially 6000 rpm, increased to 6200 rpm then decreased to 6100 rpm. The aortic valve did not appear to open at 6000 rpm. Difficult to comment on images as quite poor. Dalton McleanMD Electronically signed by Wilfred Lacy Signature Date/Time: 05/14/2023/4:48:39 PM    Final    DG Chest 2 View  Result Date: 05/14/2023 CLINICAL DATA:  Shortness of breath.  Left pleural effusion. EXAM: CHEST - 2 VIEW COMPARISON:  May 10, 2023. FINDINGS: Stable cardiomediastinal silhouette. Left-sided defibrillator is unchanged. Left ventricular assist device is unchanged. Sided PICC line is unchanged. Left basilar opacity is not well visualized currently due to overlying pacemaker device. IMPRESSION: Stable support apparatus. Left basilar opacity is not well visualized currently due to overlying pacemaker device. Electronically Signed   By: Lupita Raider M.D.   On: 05/14/2023 11:32    Medications:    Scheduled Medications:  allopurinol  100 mg Oral Daily   amiodarone  200 mg Oral BID   aspirin EC  81 mg Oral Daily   Chlorhexidine Gluconate Cloth  6 each Topical Daily   digoxin  0.125 mg Oral Daily   escitalopram  10 mg Oral QHS   ezetimibe  10 mg Oral Daily   Fe Fum-Vit C-Vit B12-FA  1 capsule Oral QPC breakfast   furosemide  40 mg Intravenous BID   gabapentin  400 mg Oral TID   insulin aspart  0-24 Units Subcutaneous TID WC & HS   insulin detemir  20 Units Subcutaneous BID   levothyroxine  25 mcg Oral Daily   mexiletine  250 mg Oral Q12H   rosuvastatin  40 mg Oral Daily   sodium chloride flush  10-40 mL Intracatheter Q12H   sodium chloride flush  3 mL Intravenous Q12H   spironolactone  25 mg Oral Daily    Warfarin - Pharmacist Dosing Inpatient   Does not apply q1600    Infusions:  milrinone 0.125 mcg/kg/min (05/15/23 1836)     PRN Medications: acetaminophen, antiseptic oral rinse, dextrose, ondansetron (ZOFRAN) IV, mouth rinse, oxyCODONE, polyvinyl alcohol, sodium chloride flush  Patient Profile   Joe [name] is a 52 y.o. male with h/o morbid obesity, DM2, HTN, HL, OSA, CAD and systolic HF.  Now s/p HM3 LVAD Implantation 11/14 by Dr. Donata Clay.  Assessment/Plan:    1.  Chronic Systolic HF s/p HM3 Implantation 11/14 - due to iCM - Echo 05/01/23: EF 20%, RV moderately down (improved) - RHC (9/24): elevated biventricular filling pressures with low PAPi; CI 2.2 - S/p HM III LVAD 05/03/23 by Dr. Maren Beach - Intra-op TEE - EF 20-25%, RV moderately reduced, IV septum with mild rightward bowing, AV opens intermittently - Limited Echo 11/18: LV decompressed, RV appeared dilated but difficult windows.  - Weaned from inotropes and pressors post-op, however developed worsening RV function. Co-ox 38. Restarted on Milrinone. - RAMP echo 11/21: Speeds left at 6000, will increased to 6100 after am diuresis. - Continue Milrinone 0.125 mcg/kg/min. Co-ox 49. Calculated Fick 6.5/2.2 - Lasix 40 IV BID yesterday 4L UOP. UNNA boots in place. CVP 12. - Lasix 40 IV this am. Will reassess need in afternoon, given preload dependent RV. - High MAPs overnight in 90s. Consider addition of Sildenafil today. - Continue Spiro to 25 mg  - Continue Digoxin 0.125  - INR 1.6  Warfarin d/w PharmD   2. H/o VT Arrest  Hx PVCs - had VT arrest x 2 in 10/24 - Continue Amio 200 mg bid and Mexiletine 250 mg bid   3. CAD with chronic stable angina - s/p multiple stents to LAD and LCX - LHC (9/24) with stable 3v CAD - No s/s angina - Continue ASA + Crestor 40mg  daily  4. AKI  - Resolved   5. DM2 - A1c 7.2 (1/24) - SSI   6. OSA - Not using CPAP, says machine was recalled a while ago. Needs new machine. -  Followed outpatient   7. Hypokalemia - K pending this am - Increase Kelby Fam of Stay: 16  [place] Parnika Tweten, NP 05/16/2023, 7:21 AM  VAD Team --- VAD ISSUES ONLY--- Pager [phone] (7am - 7am)  Advanced Heart Failure Team  Pager [phone] (M-F; 7a - 5p)  Please contact [facility] for night-coverage after hours (5p -7a ) and weekends on [email or web address]

## 2023-05-16 NOTE — Progress Notes (Signed)
PHARMACY - ANTICOAGULATION CONSULT NOTE  Pharmacy Consult for warfarin Indication: LVAD HM3 implanted 11/14  Allergies  Allergen Reactions   Tramadol Other (See Comments)    Hallucinations    Patient Measurements: Height: 6\' 3"  (190.5 cm) Weight: (!) 142.8 kg (314 lb 13.1 oz) (scale A) IBW/kg (Calculated) : 84.5   Vital Signs: Temp: 98.6 F (37 C) (11/27 0722) Temp Source: Oral (11/27 0722) BP: 124/88 (11/27 0722) Pulse Rate: 86 (11/27 0326)  Labs: Recent Labs    05/14/23 0415 05/15/23 0247 05/16/23 0335  HGB 7.6* 7.6*  --   HCT 24.6* 23.5*  --   PLT 335 359  --   LABPROT 19.9* 19.1* 18.8*  INR 1.7* 1.6* 1.6*  CREATININE 1.00 1.03  --     Estimated Creatinine Clearance: 127.9 mL/min (by C-G formula based on SCr of 1.03 mg/dL).   Medical History: Past Medical History:  Diagnosis Date   Abnormal EKG    Acute systolic heart failure (HCC) 09/06/2017   Angina pectoris (HCC) 09/05/2017   Body mass index 45.0-49.9, adult (HCC)    CAD S/P percutaneous coronary angioplasty 09/07/2017   mLAD and dLAD PCI with DES 09/06/17   CHF (congestive heart failure) (HCC)    Chronic systolic (congestive) heart failure (HCC) 03/19/2019   Complication of anesthesia 1995   "had hard time waking up; breathing w/vasectomy"   Coronary artery disease    Erectile dysfunction    Essential hypertension, benign    Fatigue    GERD (gastroesophageal reflux disease)    Gout    High cholesterol    "just started tx yesterday" (09/06/2017)   History of gout    "RX prn" (09/06/2017)   Ischemic cardiomyopathy 09/07/2017   EF 25-35%   Muscular chest pain    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   Below Left Nare (MOH's)   Obesity    Obstructive sleep apnea    OSA on CPAP    Plantar fasciitis    Pre-operative clearance 02/11/2018   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/02/2019   [employer] Jude   Right bundle branch block    Shortness of breath 09/05/2017    Testosterone deficiency    Type 2 diabetes mellitus with complication, without long-term current use of insulin (HCC) 09/05/2017   Type II diabetes mellitus (HCC)    "started tx 08/28/2017"    Assessment: 52yom with heart failure s/p LVAD implant HM3 11/14. INR slightly elevated at baseline (1.6).   INR 1.6 is subtherapeutic today, no increase after 7.5 mg yesterday. Hgb 7.6 (low stable), pltc 359, LDH 223 (stable). Eating 100% of meals per chart, noted protein shakes from home in the room that may be contributing to lack of change in INR. No new DDIs noted (continues on amiodarone 200 mg BID).  Goal of Therapy:  INR 2-2.5 Monitor platelets by anticoagulation protocol: Yes   Plan:  Warfarin 7.5 mg x1 tonight Daily PT/INR Monitor s/s bleeding   Thank you for allowing pharmacy to participate in this patient's care,  Ernestene Kiel, PharmD PGY1 Pharmacy Resident  Please check AMION for all [facility] Pharmacy phone numbers After 10:00 PM, call Main Pharmacy [phone] 05/16/2023  9:39 AM

## 2023-05-16 NOTE — Progress Notes (Addendum)
Nutrition Follow-up  DOCUMENTATION CODES:   Not applicable  INTERVENTION:   - Continue double protein with meals - Encouraged pt to order protein foods with each meal and continue good PO intake  - 2 Premier Protein shakes goal   NUTRITION DIAGNOSIS:   Increased nutrient needs related to chronic illness as evidenced by estimated needs.  - Being addressed via double protein with meals and supplements   GOAL:   Patient will meet greater than or equal to 90% of their needs  - Meeting  MONITOR:   PO intake, Supplement acceptance, Labs, Weight trends, I & O's  REASON FOR ASSESSMENT:   Consult LVAD Eval  ASSESSMENT:   52 year old male who was admitted on 11/11 for surgical optimization for LVAD HM3 implant. PMH of T2DM, HTN, HLD, OSA, CAD, systolic HF (EF 20-25%).  11/14 HM3 LVAD  placed 11/15 Extubated  Pt has been doing very well and was eating lunch on visit. He has had good PO intake and appetite even with double proteins added to his meals. He states only drinking 1 Premier Protein shake per day, but has been trying to eat protein foods during each meal. Discussed with pt the importance of protein intake during this time. Encouraged him to try and drink 2 Premier Protein shakes per day.   Pt states having very loose bowel movements however he reports in the last 2 days they have become more solid, but still soft.   4L UOP yesterday with an additional 2L today. Per NP, will slow rate of diuresis today.   Admit weight: 139 kg  Current weight: 142.8 kg   Lowest wt this admission 136.5 kg.   Average Meal Intake: 11/19-11/27: 96% intake x 8 recorded meals  Nutritionally Relevant Medications: Scheduled Meds:  Fe Fum-Vit C-Vit B12-FA  1 capsule Oral QPC breakfast   insulin aspart  0-24 Units Subcutaneous TID WC & HS   insulin detemir  20 Units Subcutaneous BID   levothyroxine  25 mcg Oral Daily   potassium chloride  40 mEq Oral Q4H   warfarin  7.5 mg Oral ONCE-1600    Continuous Infusions:  milrinone 0.125 mcg/kg/min (05/16/23 1400)   Labs Reviewed: Sodium 132, Potassium 3.4, Chloride 95,  CBG ranges from 111-226 mg/dL over the last 24 hours  Diet Order:   Diet Order             Diet Heart Room service appropriate? Yes; Fluid consistency: Thin  Diet effective now                   EDUCATION NEEDS:   Education needs have been addressed  Skin:  Skin Assessment: Skin Integrity Issues: Skin Integrity Issues:: Incisions Incisions: Closed abdomen and chest incision  Last BM:  05/15/23: Type 5  Height:   Ht Readings from Last 1 Encounters:  05/03/23 6\' 3"  (1.905 m)    Weight:   Wt Readings from Last 1 Encounters:  05/16/23 (!) 142.8 kg    Ideal Body Weight:  89.1 kg  BMI:  Body mass index is 39.35 kg/m.  Estimated Nutritional Needs:   Kcal:  2400-2600  Protein:  120-140 grams  Fluid:  2.0 L  Elliot Dally, RD Registered Dietitian  See Amion for more information

## 2023-05-16 NOTE — Plan of Care (Signed)
Problem: Cardiovascular: Goal: Ability to achieve and maintain adequate cardiovascular perfusion will improve Outcome: Progressing Goal: Vascular access site(s) Level 0-1 will be maintained Outcome: Progressing   Problem: Clinical Measurements: Goal: Ability to maintain clinical measurements within normal limits will improve Outcome: Progressing Goal: Will remain free from infection Outcome: Progressing Goal: Diagnostic test results will improve Outcome: Progressing Goal: Respiratory complications will improve Outcome: Progressing Goal: Cardiovascular complication will be avoided Outcome: Progressing   Problem: Activity: Goal: Risk for activity intolerance will decrease Outcome: Progressing   Problem: Coping: Goal: Level of anxiety will decrease Outcome: Progressing   Problem: Elimination: Goal: Will not experience complications related to bowel motility Outcome: Progressing Goal: Will not experience complications related to urinary retention Outcome: Progressing   Problem: Pain Management: Goal: General experience of comfort will improve Outcome: Progressing   Problem: Safety: Goal: Ability to remain free from injury will improve Outcome: Progressing   Problem: Skin Integrity: Goal: Risk for impaired skin integrity will decrease Outcome: Progressing

## 2023-05-16 NOTE — Progress Notes (Signed)
ProgressHeartMate 3 Rounding Note Status post implantation of HeartMate 3 May 03, 2023  Subjective:   Postop day 15 HeartMate 3 implant  Remains on low dose milrinone with low Coox despite good VAD flow and Hb low at 7.6g not responding to trinsicon. Has intermittent bad days with weakness, fatigue.  Speed remains at 6000 RPM with VAD flows  5.7L/min INR now 1.6 , orders per pharmacy  Sternal incision clean, dry  Exit site of the driveline is clean and dry. LVAD INTERROGATION:  HeartMate II LVAD:  Flow 5.6 liters/min, speed 6000, power 4.2, PI 1.1.  Controller intact.   Objective:    Vital Signs:   Temp:  [97.7 F (36.5 C)-98.6 F (37 C)] 98.6 F (37 C) (11/27 0722) Pulse Rate:  [80-95] 86 (11/27 0326) Resp:  [15-25] 25 (11/27 0722) BP: (81-124)/(66-88) 124/88 (11/27 0722) SpO2:  [92 %-96 %] 94 % (11/27 0800) Weight:  [142.8 kg] 142.8 kg (11/27 0326) Last BM Date : 05/15/23 Mean arterial Pressure 80-85  Intake/Output:   Intake/Output Summary (Last 24 hours) at 05/16/2023 1011 Last data filed at 05/16/2023 0800 Gross per 24 hour  Intake 1092.4 ml  Output 4150 ml  Net -3057.6 ml     Physical Exam: General:  Well appearing. No resp difficulty    Sternal incision dry, clean. VAD tunnel dressing dry HEENT: normal Neck: supple. JVP . Carotids no bruits. No lymphadenopathy or thryomegaly appreciated. Cor: Mechanical heart sounds with LVAD hum present. Lungs: clear Abdomen: soft, nontender, nondistended. No hepatosplenomegaly. No bruits or masses. Good bowel sounds. Extremities: no cyanosis, clubbing, rash, edema Neuro: alert & orientedx3, cranial nerves grossly intact. moves all 4 extremities w/o difficulty. Affect pleasant  Telemetry: A-V sequentially paced at 80 bpm  Labs: Basic Metabolic Panel: Recent Labs  Lab 05/10/23 0444 05/10/23 1626 05/11/23 0352 05/11/23 1745 05/12/23 0415 05/13/23 0536 05/14/23 0415 05/15/23 0247  NA 132*   < > 131* 132*  133* 130* 129* 130*  K 3.5   < > 4.0 3.9 4.1 3.3* 3.9 3.5  CL 91*   < > 91* 92* 91* 91* 93* 95*  CO2 34*   < > 28 30 32 29 27 27   GLUCOSE 89   < > 114* 209* 91 95 133* 171*  BUN 12   < > 14 17 18 15 12 15   CREATININE 0.92   < > 1.13 1.12 1.15 1.10 1.00 1.03  CALCIUM 8.1*   < > 7.9* 8.0* 8.2* 7.8* 8.0* 7.7*  MG 2.1  --  2.1  --   --   --   --   --   PHOS 4.0  --   --   --   --   --   --   --    < > = values in this interval not displayed.    Liver Function Tests: No results for input(s): "AST", "ALT", "ALKPHOS", "BILITOT", "PROT", "ALBUMIN" in the last 168 hours.  No results for input(s): "LIPASE", "AMYLASE" in the last 168 hours. No results for input(s): "AMMONIA" in the last 168 hours.  CBC: Recent Labs  Lab 05/10/23 0444 05/11/23 0352 05/12/23 0415 05/13/23 0536 05/14/23 0415 05/15/23 0247  WBC 9.3 10.1 11.1* 14.4* 15.0* 14.1*  NEUTROABS 6.9  --   --   --   --   --   HGB 8.3* 8.5* 9.1* 7.8* 7.6* 7.6*  HCT 25.5* 26.8* 29.2* 24.2* 24.6* 23.5*  MCV 97.3 98.2 97.7 97.2 98.8 98.7  PLT 231 262  290 320 335 359    INR: Recent Labs  Lab 05/12/23 0415 05/13/23 0536 05/14/23 0415 05/15/23 0247 05/16/23 0335  INR 1.7* 1.6* 1.7* 1.6* 1.6*    Other results: EKG:   Imaging: ECHOCARDIOGRAM LIMITED  Result Date: 05/14/2023    ECHOCARDIOGRAM LIMITED REPORT   Patient Name:   Joe [name] [facility] Date of Exam: 05/14/2023 Medical Rec #:  [id number]              Height:       75.0 in Accession #:    [id number]             Weight:       311.1 lb Date of Birth:  Oct 22, 1970               BSA:          2.648 m Patient Age:    52 years               BP:           0/0 mmHg Patient Gender: M                      HR:           96 bpm. Exam Location:  Inpatient Procedure: Limited Echo Indications:    LVAD evaluation Z95.811  History:        Patient has prior history of Echocardiogram examinations, most                 recent 05/10/2023. CHF and Cardiomyopathy, CAD; Arrythmias:RBBB.   Sonographer:    Dondra Prader RVT RCS Referring Phys: [phone] ADITYA Gasper Lloyd  Sonographer Comments: Technically challenging study due to limited acoustic windows and Technically difficult study due to poor echo windows. IMPRESSIONS  1. LVAD ramp echo. LV EF < 20%. Speed initially 6000 rpm, increased to 6200 rpm then decreased to 6100 rpm. The aortic valve did not appear to open at 6000 rpm. Difficult to comment on images as quite poor. FINDINGS  Left Ventricle: LVAD ramp echo. LV EF < 20%. Speed initially 6000 rpm, increased to 6200 rpm then decreased to 6100 rpm. The aortic valve did not appear to open at 6000 rpm. Difficult to comment on images as quite poor. Dalton Mattel Electronically signed by Wilfred Lacy Signature Date/Time: 05/14/2023/4:48:39 PM    Final      Medications:     Scheduled Medications:  sodium chloride   Intravenous Once   allopurinol  100 mg Oral Daily   amiodarone  200 mg Oral BID   aspirin EC  81 mg Oral Daily   Chlorhexidine Gluconate Cloth  6 each Topical Daily   digoxin  0.125 mg Oral Daily   escitalopram  10 mg Oral QHS   ezetimibe  10 mg Oral Daily   Fe Fum-Vit C-Vit B12-FA  1 capsule Oral QPC breakfast   gabapentin  400 mg Oral TID   insulin aspart  0-24 Units Subcutaneous TID WC & HS   insulin detemir  20 Units Subcutaneous BID   levothyroxine  25 mcg Oral Daily   mexiletine  250 mg Oral Q12H   rosuvastatin  40 mg Oral Daily   sodium chloride flush  10-40 mL Intracatheter Q12H   sodium chloride flush  3 mL Intravenous Q12H   spironolactone  25 mg Oral Daily   warfarin  7.5 mg Oral ONCE-1600   Warfarin - Pharmacist Dosing Inpatient   Does not apply q1600  Infusions:  milrinone 0.125 mcg/kg/min (05/15/23 1836)     PRN Medications: acetaminophen, antiseptic oral rinse, dextrose, ondansetron (ZOFRAN) IV, mouth rinse, oxyCODONE, polyvinyl alcohol, sodium chloride flush   Assessment:   Morbidly obese 52 year old male with type 2 diabetes, sleep  apnea and ischemic cardiomyopathy with EF of 10%.  Patient making good progress now with ambulation. Hemodynamics stable on low-dose milrinone, careful doses of Lasix.  Plan slow wean off milrinone. Coox low on 0.125 Mil Will give 1 unit PRBC to help him get off Milrinone- may need increased speed as well.  , and compared the results to the patient's prior recorded data.  No programming changes were made.  The LVAD is functioning within specified parameters.  The patient performs LVAD self-test daily.  LVAD interrogation was negative for any significant power changes, alarms or PI events/speed drops.  LVAD equipment check completed and is in good working order.  Back-up equipment present.   LVAD education done on emergency procedures and precautions and reviewed exit site care.  Length of Stay: [address]  Lovett Sox 05/16/2023, 10:11 AM

## 2023-05-16 NOTE — Progress Notes (Signed)
LVAD Coordinator Rounding Note:   Admitted 04/30/23 due to for optimization prior to VAD.    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   Pt sitting up in bed on my arrival. States he is feeling good this morning other than his bottom is sore.   Speed increased this morning by Sabharwal to 6100.  St Jude rep turned on therapies yesterday. Demand rate dropped to 60.  VAD Education completed 05/11/23 with pt and wife Lenore at bedside. Please see separate note for details. Home equipment arrived and checked in.     Vital signs: Temp: 97.8 HR: 82 Doppler Pressure: 90 Automatic BP: 95/84 (88) O2 Sat: 97% RA Wt: 316.4>305.5>317.9>315.7>313>304.2>311.1>316.4>314.8 lbs     LVAD interrogation reveals:  Speed: 6000 Flow: 5.5 Power: 4.8 w PI: 4  Alarms: none Events: 5 PI today; none yesterday Hematocrit: 25  Fixed speed: 6000 Low speed limit: 5700   Drive Line: Dressing change performed by  VAD coordinator. Existing VAD dressing removed and site care performed using sterile technique. Drive line exit site cleaned with Chlora prep applicators x 2, rinsed with sterile saline and allowed to dry. Silver strip applied flush with exit site. Exit site is unincorporated, the velour is fully implanted at exit site with 1 suture in place. Moderate amount fat necrosis drainage. No redness, tenderness, foul odor or rash noted. There are 2 open sores from tape at the bottom of dressing. These were covered with 2 x 2 prior to placing tape. Drive line anchor re-applied. Continue daily dressing changes by nurse champion or VAD Coordinator. Next dressing change 05/17/23.           Labs:  LDH trend: 275>251>260>247>251>238>210>222>223   Hgb trend: 9.9>8>8.1>8>8.3>8.5>7.6>7.6>8   INR trend: 1.3>1.4>2.0>2.6>2.1>2.0>1.7>1.6   Anticoagulation Plan: -INR Goal: 2.0-2.5 -ASA Dose: 81 mg stop date of 06/02/23   Blood Products:  Intra-op- 6  FFP 1 PLT 2 CYRO   05/05/23>>1 u/PRBC 05/16/23>>1 u/PRBC   Device: -St Jude BiV -Therapies: ON   Arrythmias:    Respiratory: Extubated 05/04/23   Infection:    Renal:  -BUN/CRT: 16/1.42>21/1.14>15/1.04>11/1.05>14/.87>14/1.13>1.0>1.03>1.08   Gtts: Milrinone 0.125 mcg/kg/min    VAD Education: Discharge teaching completed 05/11/23 See separate note for details Ongoing drive line dressing change teaching with pt's wife  Adverse Events on VAD:   Plan/Recommendations:  1. Page VAD coordinators for any equipment or drive line issues 2. Daily drive line dressing changes per nurse champion or VAD Coordinator   Carlton Adam RN VAD Coordinator  Office: [phone]  24/7 Pager: [phone]

## 2023-05-16 NOTE — Progress Notes (Signed)
H&V Care Navigation CSW Progress Note  Outpatient Heart Failure CSW met with pt at bedside to check in.  Patient reports he is doing well and feeling optimistic about his progress.  Was hopeful to go home today but states they are still adjusting medications so now looking like a return home Friday.  Patient reports no needs at this time.  CSW will continue to follow during inpatient stay.   SDOH Screenings   Food Insecurity: No Food Insecurity (04/30/2023)  Housing: Low Risk  (04/30/2023)  Transportation Needs: No Transportation Needs (04/30/2023)  Utilities: Not At Risk (04/30/2023)  Financial Resource Strain: Low Risk  (04/20/2023)  Social Connections: Unknown (10/28/2021)   Received from [facility]  Tobacco Use: Low Risk  (05/03/2023)   Burna Sis, LCSW Clinical Social Worker [facility] Desk#: [phone] Cell#: [phone]

## 2023-05-17 DIAGNOSIS — I5023 Acute on chronic systolic (congestive) heart failure: Secondary | ICD-10-CM | POA: Diagnosis not present

## 2023-05-17 DIAGNOSIS — Z95811 Presence of heart assist device: Secondary | ICD-10-CM | POA: Diagnosis not present

## 2023-05-17 LAB — COOXEMETRY PANEL
Carboxyhemoglobin: 2.5 % — ABNORMAL HIGH (ref 0.5–1.5)
Methemoglobin: <0.7 % (ref 0.0–1.5)
O2 Saturation: 60.5 %
Total hemoglobin: 9.4 g/dL — ABNORMAL LOW (ref 12.0–16.0)

## 2023-05-17 LAB — TYPE AND SCREEN
ABO/RH(D): O NEG
Antibody Screen: NEGATIVE
Unit division: 0

## 2023-05-17 LAB — GLUCOSE, CAPILLARY
Glucose-Capillary: 129 mg/dL — ABNORMAL HIGH (ref 70–99)
Glucose-Capillary: 129 mg/dL — ABNORMAL HIGH (ref 70–99)
Glucose-Capillary: 124 mg/dL — ABNORMAL HIGH (ref 70–99)
Glucose-Capillary: 168 mg/dL — ABNORMAL HIGH (ref 70–99)

## 2023-05-17 LAB — BASIC METABOLIC PANEL
Anion gap: 9 (ref 5–15)
BUN: 12 mg/dL (ref 6–20)
CO2: 25 mmol/L (ref 22–32)
Calcium: 8.1 mg/dL — ABNORMAL LOW (ref 8.9–10.3)
Chloride: 99 mmol/L (ref 98–111)
Creatinine, Ser: 0.97 mg/dL (ref 0.61–1.24)
GFR, Estimated: >60 mL/min (ref >60)
Glucose, Bld: 163 mg/dL — ABNORMAL HIGH (ref 70–99)
Potassium: 3.9 mmol/L (ref 3.5–5.1)
Sodium: 133 mmol/L — ABNORMAL LOW (ref 135–145)

## 2023-05-17 LAB — CBC
HCT: 28.3 % — ABNORMAL LOW (ref 39.0–52.0)
Hemoglobin: 8.9 g/dL — ABNORMAL LOW (ref 13.0–17.0)
MCH: 31.1 pg (ref 26.0–34.0)
MCHC: 31.4 g/dL (ref 30.0–36.0)
MCV: 99 fL (ref 80.0–100.0)
Platelets: 371 10*3/uL (ref 150–400)
RBC: 2.86 MIL/uL — ABNORMAL LOW (ref 4.22–5.81)
RDW: 18 % — ABNORMAL HIGH (ref 11.5–15.5)
WBC: 9.6 10*3/uL (ref 4.0–10.5)
nRBC: 0 % (ref 0.0–0.2)

## 2023-05-17 LAB — PROTIME-INR
INR: 1.6 — ABNORMAL HIGH (ref 0.8–1.2)
Prothrombin Time: 19.2 s — ABNORMAL HIGH (ref 11.4–15.2)

## 2023-05-17 LAB — MAGNESIUM: Magnesium: 2.4 mg/dL (ref 1.7–2.4)

## 2023-05-17 LAB — BPAM RBC
Blood Product Expiration Date: 202412022359
ISSUE DATE / TIME: 202411271124
Unit Type and Rh: 9500

## 2023-05-17 LAB — LACTATE DEHYDROGENASE: LDH: 243 U/L — ABNORMAL HIGH (ref 98–192)

## 2023-05-17 LAB — BRAIN NATRIURETIC PEPTIDE: B Natriuretic Peptide: 130.8 pg/mL — ABNORMAL HIGH (ref 0.0–100.0)

## 2023-05-17 MED ORDER — POTASSIUM CHLORIDE CRYS ER 20 MEQ PO TBCR
40.0000 meq | EXTENDED_RELEASE_TABLET | ORAL | Status: AC
  Administered 2023-05-17 (×2): 40 meq via ORAL
  Filled 2023-05-17 (×2): qty 2

## 2023-05-17 MED ORDER — POTASSIUM CHLORIDE CRYS ER 20 MEQ PO TBCR
40.0000 meq | EXTENDED_RELEASE_TABLET | Freq: Once | ORAL | Status: AC
  Administered 2023-05-17: 40 meq via ORAL
  Filled 2023-05-17: qty 2

## 2023-05-17 MED ORDER — POTASSIUM CHLORIDE CRYS ER 20 MEQ PO TBCR: 40.0000 meq | EXTENDED_RELEASE_TABLET | Freq: Once | ORAL | Status: DC

## 2023-05-17 MED ORDER — WARFARIN SODIUM 7.5 MG PO TABS
7.5000 mg | ORAL_TABLET | Freq: Once | ORAL | Status: AC
  Administered 2023-05-17: 7.5 mg via ORAL
  Filled 2023-05-17: qty 1

## 2023-05-17 MED ORDER — FUROSEMIDE 10 MG/ML IJ SOLN
60.0000 mg | Freq: Two times a day (BID) | INTRAMUSCULAR | Status: AC
  Administered 2023-05-17 (×2): 60 mg via INTRAVENOUS
  Filled 2023-05-17 (×2): qty 6

## 2023-05-17 MED ORDER — SILDENAFIL CITRATE 20 MG PO TABS
20.0000 mg | ORAL_TABLET | Freq: Three times a day (TID) | ORAL | Status: DC
  Administered 2023-05-17 – 2023-05-21 (×13): 20 mg via ORAL
  Filled 2023-05-17 (×15): qty 1

## 2023-05-17 NOTE — Plan of Care (Signed)
  Problem: Cardiovascular: Goal: Ability to achieve and maintain adequate cardiovascular perfusion will improve Outcome: Progressing Goal: Vascular access site(s) Level 0-1 will be maintained Outcome: Progressing   Problem: Clinical Measurements: Goal: Ability to maintain clinical measurements within normal limits will improve Outcome: Progressing Goal: Will remain free from infection Outcome: Progressing Goal: Diagnostic test results will improve Outcome: Progressing Goal: Respiratory complications will improve Outcome: Progressing Goal: Cardiovascular complication will be avoided Outcome: Progressing   Problem: Activity: Goal: Risk for activity intolerance will decrease Outcome: Progressing   Problem: Coping: Goal: Level of anxiety will decrease Outcome: Progressing   Problem: Elimination: Goal: Will not experience complications related to bowel motility Outcome: Progressing Goal: Will not experience complications related to urinary retention Outcome: Progressing   Problem: Pain Management: Goal: General experience of comfort will improve Outcome: Progressing   Problem: Safety: Goal: Ability to remain free from injury will improve Outcome: Progressing   Problem: Skin Integrity: Goal: Risk for impaired skin integrity will decrease Outcome: Progressing

## 2023-05-17 NOTE — Progress Notes (Signed)
PHARMACY - ANTICOAGULATION CONSULT NOTE  Pharmacy Consult for warfarin Indication: LVAD HM3 implanted 11/14  Allergies  Allergen Reactions   Tramadol Other (See Comments)    Hallucinations    Patient Measurements: Height: 6\' 3"  (190.5 cm) Weight: (!) 143.2 kg (315 lb 11.2 oz) IBW/kg (Calculated) : 84.5   Vital Signs: Temp: 97.8 F (36.6 C) (11/28 1057) Temp Source: Oral (11/28 1057) BP: 98/62 (11/28 1057) Pulse Rate: 62 (11/28 0338)  Labs: Recent Labs    05/15/23 0247 05/16/23 0335 05/16/23 0928 05/17/23 0400  HGB 7.6*  --  8.0* 8.9*  HCT 23.5*  --  25.6* 28.3*  PLT 359  --  381 371  LABPROT 19.1* 18.8*  --  19.2*  INR 1.6* 1.6*  --  1.6*  CREATININE 1.03  --  1.08 0.97    Estimated Creatinine Clearance: 136.1 mL/min (by C-G formula based on SCr of 0.97 mg/dL).   Medical History: Past Medical History:  Diagnosis Date   Abnormal EKG    Acute systolic heart failure (HCC) 09/06/2017   Angina pectoris (HCC) 09/05/2017   Body mass index 45.0-49.9, adult (HCC)    CAD S/P percutaneous coronary angioplasty 09/07/2017   mLAD and dLAD PCI with DES 09/06/17   CHF (congestive heart failure) (HCC)    Chronic systolic (congestive) heart failure (HCC) 03/19/2019   Complication of anesthesia 1995   "had hard time waking up; breathing w/vasectomy"   Coronary artery disease    Erectile dysfunction    Essential hypertension, benign    Fatigue    GERD (gastroesophageal reflux disease)    Gout    High cholesterol    "just started tx yesterday" (09/06/2017)   History of gout    "RX prn" (09/06/2017)   Ischemic cardiomyopathy 09/07/2017   EF 25-35%   Muscular chest pain    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   Below Left Nare (MOH's)   Obesity    Obstructive sleep apnea    OSA on CPAP    Plantar fasciitis    Pre-operative clearance 02/11/2018   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/02/2019   Saint Jude   Right bundle branch block     Shortness of breath 09/05/2017   Testosterone deficiency    Type 2 diabetes mellitus with complication, without long-term current use of insulin (HCC) 09/05/2017   Type II diabetes mellitus (HCC)    "started tx 08/28/2017"    Assessment: Joe [name] with heart failure s/p LVAD implant HM3 11/14. INR slightly elevated at baseline (1.6).   INR 1.6 is subtherapeutic today, no increase after 7.5 mg yesterday. Hgb increased 8.9 (+0.9), pltc 359 >371, LDH 223 >243 (stable). Eating 100% of meals per chart, noted protein shakes from home in the room that may be contributing to lack of change in INR. No new DDIs noted (continues on amiodarone 200 mg BID).  Goal of Therapy:  INR 2-2.5 Monitor platelets by anticoagulation protocol: Yes   Plan:  Warfarin 7.5 mg x1 tonight; if not increase in INR tomorrow will go up on dose. Daily PT/INR Monitor s/s bleeding   Thank you for allowing pharmacy to participate in this patient's care,  Wilmer Floor, PharmD PGY2 Cardiology Pharmacy Resident  Please check AMION for all [facility] Pharmacy phone numbers After 10:00 PM, call Main Pharmacy [phone] 05/17/2023  11:22 AM

## 2023-05-17 NOTE — Progress Notes (Addendum)
Patient ID: Joe [name], male   DOB: 04-05-1971, 52 y.o.   MRN: [id number]   Advanced Heart Failure VAD Team Note  PCP-Cardiologist: [name], MD   Subjective:    11/14: s/p HM3 LVAD implantation.   Milrinone 0.125. Co-ox 61%.  No dyspnea.  Did not walk much yesterday.  I/Os net negative 1150 with gentle diuresis yesterday but weight up 1 lb.  CVP line poor waveform but appears to be 14-15.   LVAD Interrogation HM3: Speed: 6100    Flow: 5.8    PI: 3.4    Power: 5    4 PI events over the last day  Objective:    Vital Signs:   Temp:  [97.7 F (36.5 C)-98.7 F (37.1 C)] 97.7 F (36.5 C) (11/28 0338) Pulse Rate:  [62-87] 62 (11/28 0338) Resp:  [14-25] 18 (11/28 0338) BP: (95-124)/(81-93) 120/93 (11/28 0338) SpO2:  [93 %-97 %] 97 % (11/28 0338) Weight:  [143.2 kg] 143.2 kg (11/28 0338) Last BM Date : 05/16/23  Mean arterial Pressure 80s-100  Intake/Output:   Intake/Output Summary (Last 24 hours) at 05/17/2023 0646 Last data filed at 05/17/2023 0337 Gross per 24 hour  Intake 1500.02 ml  Output 2650 ml  Net -1149.98 ml   Physical Exam    CVP 14-15 General: Well appearing this am. NAD.  HEENT: Normal. Neck: Supple, JVP 14 cm. Carotids OK.  Cardiac:  Mechanical heart sounds with LVAD hum present.  Lungs:  CTAB, normal effort.  Abdomen:  NT, ND, no HSM. No bruits or masses. +BS  LVAD exit site: Well-healed and incorporated. Dressing dry and intact. No erythema or drainage. Stabilization device present and accurately applied. Driveline dressing changed daily per sterile technique. Extremities:  Warm and dry. No cyanosis, clubbing, rash, or edema.  Neuro:  Alert & oriented x 3. Cranial nerves grossly intact. Moves all 4 extremities w/o difficulty. Affect pleasant    Telemetry   AV paced 80 (personally reviewed)  Labs   Basic Metabolic Panel: Recent Labs  Lab 05/11/23 0352 05/11/23 1745 05/13/23 0536 05/14/23 0415 05/15/23 0247 05/16/23 0928  05/17/23 0400  NA 131*   < > 130* 129* 130* 132* 133*  K 4.0   < > 3.3* 3.9 3.5 3.4* 3.9  CL 91*   < > 91* 93* 95* 95* 99  CO2 28   < > 29 27 27 25 25   GLUCOSE 114*   < > 95 133* 171* 234* 163*  BUN 14   < > 15 12 15 15 12   CREATININE 1.13   < > 1.10 1.00 1.03 1.08 0.97  CALCIUM 7.9*   < > 7.8* 8.0* 7.7* 7.7* 8.1*  MG 2.1  --   --   --   --   --  2.4   < > = values in this interval not displayed.    Liver Function Tests: No results for input(s): "AST", "ALT", "ALKPHOS", "BILITOT", "PROT", "ALBUMIN" in the last 168 hours.  No results for input(s): "LIPASE", "AMYLASE" in the last 168 hours. No results for input(s): "AMMONIA" in the last 168 hours.  CBC: Recent Labs  Lab 05/13/23 0536 05/14/23 0415 05/15/23 0247 05/16/23 0928 05/17/23 0400  WBC 14.4* 15.0* 14.1* 10.3 9.6  HGB 7.8* 7.6* 7.6* 8.0* 8.9*  HCT 24.2* 24.6* 23.5* 25.6* 28.3*  MCV 97.2 98.8 98.7 98.8 99.0  PLT 320 335 359 381 371    INR: Recent Labs  Lab 05/13/23 0536 05/14/23 0415 05/15/23 0247 05/16/23 0335 05/17/23  0400  INR 1.6* 1.7* 1.6* 1.6* 1.6*    Other results:   Imaging   No results found.  Medications:    Scheduled Medications:  allopurinol  100 mg Oral Daily   amiodarone  200 mg Oral BID   aspirin EC  81 mg Oral Daily   Chlorhexidine Gluconate Cloth  6 each Topical Daily   digoxin  0.125 mg Oral Daily   escitalopram  10 mg Oral QHS   ezetimibe  10 mg Oral Daily   Fe Fum-Vit C-Vit B12-FA  1 capsule Oral QPC breakfast   gabapentin  400 mg Oral TID   insulin aspart  0-24 Units Subcutaneous TID WC & HS   insulin detemir  20 Units Subcutaneous BID   levothyroxine  25 mcg Oral Daily   mexiletine  250 mg Oral Q12H   potassium chloride  40 mEq Oral Once   rosuvastatin  40 mg Oral Daily   sildenafil  10 mg Oral TID   sodium chloride flush  10-40 mL Intracatheter Q12H   sodium chloride flush  3 mL Intravenous Q12H   spironolactone  25 mg Oral Daily   warfarin  7.5 mg Oral ONCE-1600    Warfarin - Pharmacist Dosing Inpatient   Does not apply q1600    Infusions:  milrinone 0.125 mcg/kg/min (05/16/23 1900)     PRN Medications: acetaminophen, antiseptic oral rinse, dextrose, ondansetron (ZOFRAN) IV, mouth rinse, oxyCODONE, polyvinyl alcohol, sodium chloride flush  Patient Profile   Terell Stiteler is a 52 y.o. male with h/o morbid obesity, DM2, HTN, HL, OSA, CAD and systolic HF.  Now s/p HM3 LVAD Implantation 11/14 by Dr. Donata Clay.  Assessment/Plan:    1.  Chronic Systolic HF s/p HM3 Implantation 11/14 - due to iCM - Echo 05/01/23: EF 20%, RV moderately down (improved) - RHC (9/24): elevated biventricular filling pressures with low PAPi; CI 2.2 - S/p HM III LVAD 05/03/23 by Dr. Maren Beach - Intra-op TEE - EF 20-25%, RV moderately reduced, IV septum with mild rightward bowing, AV opens intermittently - Limited Echo 11/18: LV decompressed, RV appeared dilated but difficult windows.  - Weaned from inotropes and pressors post-op, however developed worsening RV function. Co-ox 38. Restarted on Milrinone. - RAMP echo 11/21: Speeds left at 6000.  Speed increased to 6100 on 11/27.  Rare PI events.  - MAP elevated at times, RV failure => will increase sildenafil to 20 tid.  - Co-ox 61%.  Continue Milrinone 0.125 mcg/kg/min with need for further diuresis.  - Volume overloaded with CVP 14-15, will give Lasix 60 mg IV bid today and replace K. Poor CVP waveform, need to change the line.  - Plan to increase speed to 6200 rpm tomorrow.  - Continue Spiro to 25 mg  - Continue Digoxin 0.125  - INR 1.6  Warfarin d/w PharmD   2. H/o VT Arrest  Hx PVCs - had VT arrest x 2 in 10/24 - Continue Amio 200 mg bid and Mexiletine 250 mg bid   3. CAD with chronic stable angina - s/p multiple stents to LAD and LCX - LHC (9/24) with stable 3v CAD - Continue ASA + Crestor 40mg  daily  4. AKI  - Resolved   5. DM2 - A1c 7.2 (1/24) - SSI   6. OSA - Not using CPAP, says machine was  recalled a while ago. Needs new machine. - Followed outpatient   7. Hypokalemia - Give K with Lasix.   Length of Stay: 45  Marca Ancona, MD  05/17/2023, 6:46 AM  VAD Team --- VAD ISSUES ONLY--- Pager [phone] (7am - 7am)  Advanced Heart Failure Team  Pager [phone] (M-F; 7a - 5p)  Please contact [facility] for night-coverage after hours (5p -7a ) and weekends on [email or web address]

## 2023-05-17 NOTE — Progress Notes (Signed)
Drive Line: Existing VAD dressing removed and site care performed using sterile technique. Drive line exit site cleaned with Chlora prep applicators x 2, rinsed with sterile saline and allowed to dry. Silver strip applied flush with exit site. Exit site is unincorporated, the velour is fully implanted at exit site with 1 suture in place. Moderate amount  serosanguinous drainage. Slight redness, no tenderness, foul odor or rash noted. Some skin irritation from adhesive at bottom of dressing. Covered with 2x2 prior to placing tape. Drive line anchor re-applied. Continue daily dressing changes by nurse champion or VAD Coordinator. Next dressing change 05/18/23.

## 2023-05-18 DIAGNOSIS — Z95811 Presence of heart assist device: Secondary | ICD-10-CM | POA: Diagnosis not present

## 2023-05-18 DIAGNOSIS — I5023 Acute on chronic systolic (congestive) heart failure: Secondary | ICD-10-CM | POA: Diagnosis not present

## 2023-05-18 LAB — CBC
HCT: 29.3 % — ABNORMAL LOW (ref 39.0–52.0)
Hemoglobin: 9 g/dL — ABNORMAL LOW (ref 13.0–17.0)
MCH: 30.8 pg (ref 26.0–34.0)
MCHC: 30.7 g/dL (ref 30.0–36.0)
MCV: 100.3 fL — ABNORMAL HIGH (ref 80.0–100.0)
Platelets: 357 10*3/uL (ref 150–400)
RBC: 2.92 MIL/uL — ABNORMAL LOW (ref 4.22–5.81)
RDW: 17.8 % — ABNORMAL HIGH (ref 11.5–15.5)
WBC: 8 10*3/uL (ref 4.0–10.5)
nRBC: 0 % (ref 0.0–0.2)

## 2023-05-18 LAB — GLUCOSE, CAPILLARY
Glucose-Capillary: 127 mg/dL — ABNORMAL HIGH (ref 70–99)
Glucose-Capillary: 153 mg/dL — ABNORMAL HIGH (ref 70–99)
Glucose-Capillary: 141 mg/dL — ABNORMAL HIGH (ref 70–99)
Glucose-Capillary: 159 mg/dL — ABNORMAL HIGH (ref 70–99)

## 2023-05-18 LAB — BASIC METABOLIC PANEL
Anion gap: 8 (ref 5–15)
BUN: 14 mg/dL (ref 6–20)
CO2: 26 mmol/L (ref 22–32)
Calcium: 7.8 mg/dL — ABNORMAL LOW (ref 8.9–10.3)
Chloride: 99 mmol/L (ref 98–111)
Creatinine, Ser: 1.14 mg/dL (ref 0.61–1.24)
GFR, Estimated: >60 mL/min (ref >60)
Glucose, Bld: 230 mg/dL — ABNORMAL HIGH (ref 70–99)
Potassium: 3.7 mmol/L (ref 3.5–5.1)
Sodium: 133 mmol/L — ABNORMAL LOW (ref 135–145)

## 2023-05-18 LAB — DIGOXIN LEVEL: Digoxin Level: <0.2 ng/mL — ABNORMAL LOW (ref 0.8–2.0)

## 2023-05-18 LAB — PROTIME-INR
INR: 1.7 — ABNORMAL HIGH (ref 0.8–1.2)
Prothrombin Time: 19.9 s — ABNORMAL HIGH (ref 11.4–15.2)

## 2023-05-18 LAB — COOXEMETRY PANEL
Carboxyhemoglobin: 2.5 % — ABNORMAL HIGH (ref 0.5–1.5)
Methemoglobin: <0.7 % (ref 0.0–1.5)
O2 Saturation: 68 %
Total hemoglobin: 9.4 g/dL — ABNORMAL LOW (ref 12.0–16.0)

## 2023-05-18 LAB — LACTATE DEHYDROGENASE: LDH: 206 U/L — ABNORMAL HIGH (ref 98–192)

## 2023-05-18 MED ORDER — METOLAZONE 2.5 MG PO TABS
2.5000 mg | ORAL_TABLET | Freq: Once | ORAL | Status: AC
  Administered 2023-05-18: 2.5 mg via ORAL
  Filled 2023-05-18: qty 1

## 2023-05-18 MED ORDER — FUROSEMIDE 10 MG/ML IJ SOLN
80.0000 mg | Freq: Two times a day (BID) | INTRAMUSCULAR | Status: DC
  Administered 2023-05-18: 80 mg via INTRAVENOUS
  Filled 2023-05-18: qty 8

## 2023-05-18 MED ORDER — WARFARIN SODIUM 7.5 MG PO TABS
7.5000 mg | ORAL_TABLET | Freq: Once | ORAL | Status: AC
  Administered 2023-05-18: 7.5 mg via ORAL
  Filled 2023-05-18: qty 1

## 2023-05-18 MED ORDER — POTASSIUM CHLORIDE CRYS ER 20 MEQ PO TBCR
40.0000 meq | EXTENDED_RELEASE_TABLET | ORAL | Status: AC
  Administered 2023-05-18 (×4): 40 meq via ORAL
  Filled 2023-05-18 (×4): qty 2

## 2023-05-18 NOTE — Progress Notes (Signed)
Orthopedic Tech Progress Note Patient Details:  Shahmir Kulzer [facility] Feb 20, 1971 [id number]  Ortho Devices Type of Ortho Device: Radio broadcast assistant Ortho Device/Splint Location: BLE Ortho Device/Splint Interventions: Ordered, Application   Post Interventions Patient Tolerated: Well  Diannia Ruder 05/18/2023, 2:27 PM

## 2023-05-18 NOTE — Progress Notes (Signed)
   Palliative Medicine Inpatient Follow Up Note HPI: Joe [name] is a 52 y.o. male with medical history significant for chronic systolic heart failure in setting of ischemic cardiomyopathy, V-fib arrest, hyperlipidemia, obstructive sleep apnea on home nocturnal CPAP, type 2 diabetes mellitus. The Palliative medicine team has been asked to assist in VAD evaluation.   Today's Discussion 05/18/2023  *Please note that this is a verbal dictation therefore any spelling or grammatical errors are due to the "Dragon Medical One" system interpretation.  Chart reviewed inclusive of vital signs, progress notes, laboratory results, and diagnostic images.   I met with Joe [name] this afternoon. He was joined by his oldest son, daughter in law, and two grandchildren.   Joe [name] shares that he is frustrated that he is still hospitalized. He feels well enough to go home though understands that the cardiology team is still trying to get additional fluid off of him. Joe [name] shares that he see's the silver lining but is antsy to get discharged. Provided time for him to express himself.   From a symptom perspective Sundet pocket pain has resolved and he mostly feels pain on his bottom from lying down so much. He shares that he has a new mattress which helps as do his prn pain medications.   Plan to continue current care.  Palliative Support Provided.   Objective Assessment: Vital Signs Vitals:   05/18/23 0816 05/18/23 0825  BP: (!) 80/42 111/89  Pulse:    Resp: 20   Temp: 97.8 F (36.6 C)   SpO2: 97%     Intake/Output Summary (Last 24 hours) at 05/18/2023 1552 Last data filed at 05/18/2023 3474 Gross per 24 hour  Intake 1825.43 ml  Output 3050 ml  Net -1224.57 ml   Last Weight  Most recent update: 05/18/2023  3:39 AM    Weight  144.4 kg (318 lb 5.5 oz)              SUMMARY OF RECOMMENDATIONS   Full Code / Full Scope of Care  S/P LVAD 11/14    PMT will continue to follow along  incrementally  Time: 25 ______________________________________________________________________________________ Joe [name] [place] Palliative Medicine Team Team Cell Phone: [phone] Please utilize secure chat with additional questions, if there is no response within 30 minutes please call the above phone number  Palliative Medicine Team providers are available by phone from 7am to 7pm daily and can be reached through the team cell phone.  Should this patient require assistance outside of these hours, please call the patient's attending physician.

## 2023-05-18 NOTE — Progress Notes (Addendum)
PHARMACY - ANTICOAGULATION CONSULT NOTE  Pharmacy Consult for warfarin Indication: LVAD HM3 implanted 11/14  Allergies  Allergen Reactions   Tramadol Other (See Comments)    Hallucinations    Patient Measurements: Height: 6\' 3"  (190.5 cm) Weight: (!) 144.4 kg (318 lb 5.5 oz) IBW/kg (Calculated) : 84.5   Vital Signs: Temp: 97.8 F (36.6 C) (11/29 0816) Temp Source: Oral (11/29 0816) BP: 111/89 (11/29 0825)  Labs: Recent Labs    05/16/23 0335 05/16/23 0928 05/16/23 0928 05/17/23 0400 05/18/23 0355  HGB  --  8.0*   < > 8.9* 9.0*  HCT  --  25.6*  --  28.3* 29.3*  PLT  --  381  --  371 357  LABPROT 18.8*  --   --  19.2* 19.9*  INR 1.6*  --   --  1.6* 1.7*  CREATININE  --  1.08  --  0.97 1.14   < > = values in this interval not displayed.    Estimated Creatinine Clearance: 116.3 mL/min (by C-G formula based on SCr of 1.14 mg/dL).   Medical History: Past Medical History:  Diagnosis Date   Abnormal EKG    Acute systolic heart failure (HCC) 09/06/2017   Angina pectoris (HCC) 09/05/2017   Body mass index 45.0-49.9, adult (HCC)    CAD S/P percutaneous coronary angioplasty 09/07/2017   mLAD and dLAD PCI with DES 09/06/17   CHF (congestive heart failure) (HCC)    Chronic systolic (congestive) heart failure (HCC) 03/19/2019   Complication of anesthesia 1995   "had hard time waking up; breathing w/vasectomy"   Coronary artery disease    Erectile dysfunction    Essential hypertension, benign    Fatigue    GERD (gastroesophageal reflux disease)    Gout    High cholesterol    "just started tx yesterday" (09/06/2017)   History of gout    "RX prn" (09/06/2017)   Ischemic cardiomyopathy 09/07/2017   EF 25-35%   Muscular chest pain    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   Below Left Nare (MOH's)   Obesity    Obstructive sleep apnea    OSA on CPAP    Plantar fasciitis    Pre-operative clearance 02/11/2018   Presence of cardiac resynchronization therapy  defibrillator (CRT-D) 04/02/2019   Saint Jude   Right bundle branch block    Shortness of breath 09/05/2017   Testosterone deficiency    Type 2 diabetes mellitus with complication, without long-term current use of insulin (HCC) 09/05/2017   Type II diabetes mellitus (HCC)    "started tx 08/28/2017"    Assessment: 52yom with heart failure s/p LVAD implant HM3 11/14. INR slightly elevated at baseline (1.6).   INR 1.7 is subtherapeutic today, slight increase in INR after 3 days of 7.5mg  doses. Hgb (9.0), pltc (357), LDH (206) are all stable. Eating 100% of meals per chart. No new DDIs noted (continues on amiodarone 200 mg BID).  Goal of Therapy:  INR 2-2.5 Monitor platelets by anticoagulation protocol: Yes   Plan:  Warfarin 7.5 mg x1 tonight Daily PT/INR Monitor s/s bleeding    Thank you for allowing pharmacy to participate in this patient's care,  Ernestene Kiel, PharmD PGY1 Pharmacy Resident  Please check AMION for all [facility] Pharmacy phone numbers After 10:00 PM, call Main Pharmacy [phone] 05/18/2023  10:47 AM

## 2023-05-18 NOTE — Plan of Care (Signed)
  Problem: Cardiovascular: Goal: Ability to achieve and maintain adequate cardiovascular perfusion will improve Outcome: Progressing Goal: Vascular access site(s) Level 0-1 will be maintained Outcome: Progressing   Problem: Clinical Measurements: Goal: Ability to maintain clinical measurements within normal limits will improve Outcome: Progressing Goal: Will remain free from infection Outcome: Progressing Goal: Diagnostic test results will improve Outcome: Progressing Goal: Respiratory complications will improve Outcome: Progressing Goal: Cardiovascular complication will be avoided Outcome: Progressing   Problem: Activity: Goal: Risk for activity intolerance will decrease Outcome: Progressing   Problem: Coping: Goal: Level of anxiety will decrease Outcome: Progressing   Problem: Elimination: Goal: Will not experience complications related to bowel motility Outcome: Progressing Goal: Will not experience complications related to urinary retention Outcome: Progressing   Problem: Pain Management: Goal: General experience of comfort will improve Outcome: Progressing   Problem: Safety: Goal: Ability to remain free from injury will improve Outcome: Progressing   Problem: Skin Integrity: Goal: Risk for impaired skin integrity will decrease Outcome: Progressing

## 2023-05-18 NOTE — Progress Notes (Signed)
CARDIAC REHAB PHASE I    Pt is ambulating independently. Education given to pt on heart healthy diet, low sodium diet, daily weight for fluid retention, SOB, Medication compliance, when to get help from MD vs 911, and home exercise guidelines. All questions answered about VAD. Will refer pt to [facility] health for CRP2.  Pt left in the bed with call bell in reach. Pt verbalized understanding and all questions were answered.   Harrie Jeans ACSM-CEP 05/18/2023 10:22 AM

## 2023-05-18 NOTE — Progress Notes (Signed)
LVAD Coordinator Rounding Note:   Admitted 04/30/23 due to for optimization prior to VAD.    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   Pt sitting up in bed on my arrival. States he is feeling good this morning other than his bottom is sore. He is looking forward to possibly going home next week.   VAD Education completed 05/11/23 with pt and wife Lenore at bedside. Please see separate note for details. Home equipment arrived and checked in. Will continue dressing teaching with pt's wife on Monday at 9:30 AM.     Vital signs: Temp: 97.8 HR: 83 Doppler Pressure: 110 Automatic BP: 111/89 (98) O2 Sat: 97% RA Wt: 316.4>305.5>317.9>315.7>313>304.2>311.1>316.4>314.8>318.3 lbs     LVAD interrogation reveals:  Speed: 6200 Flow: 6.0 Power: 5.2 w PI: 3.6  Alarms: none Events: 2 PI today; none yesterday Hematocrit: 29  Fixed speed: 6200 Low speed limit: 5900   Drive Line: Dressing change performed by  VAD coordinator. Existing VAD dressing removed and site care performed using sterile technique. Drive line exit site cleaned with Chlora prep applicators x 2, rinsed with sterile saline and allowed to dry. Silver strip applied flush with exit site. Exit site is unincorporated, the velour is fully implanted at exit site with 1 suture in place. Moderate amount serous/fat necrosis drainage. No redness, tenderness, foul odor or rash noted. There are 2 open sores from tape at the bottom of dressing. These were covered with 2 x 2 prior to placing tape. Drive line anchor re-applied. Continue daily dressing changes by nurse champion or VAD Coordinator. Next dressing change 05/19/23.         Labs:  LDH trend: 275>251>260>247>251>238>210>222>223>206   Hgb trend: 9.9>8>8.1>8>8.3>8.5>7.6>7.6>8>9.0   INR trend: 1.3>1.4>2.0>2.6>2.1>2.0>1.7>1.6>1.7   Anticoagulation Plan: -INR Goal: 2.0-2.5 -ASA Dose: 81 mg stop date of 06/02/23   Blood  Products:  Intra-op- 6 FFP 1 PLT 2 CYRO   05/05/23>>1 u/PRBC 05/16/23>>1 u/PRBC   Device: -St Jude BiV - Demand rate decreased to 60 05/16/23 -Therapies: ON   Arrythmias:    Respiratory: Extubated 05/04/23   Infection:    Renal:  -BUN/CRT: 16/1.42>21/1.14>15/1.04>11/1.05>14/.87>14/1.13>1.0>1.03>1.08>14/1.14   Gtts: Milrinone 0.125 mcg/kg/min    VAD Education: Discharge teaching completed 05/11/23 See separate note for details Ongoing drive line dressing change teaching with pt's wife  Adverse Events on VAD:   Plan/Recommendations:  1. Page VAD coordinators for any equipment or drive line issues 2. Daily drive line dressing changes per nurse champion or VAD Coordinator   Alyce Pagan RN VAD Coordinator  Office: [phone]  24/7 Pager: [phone]

## 2023-05-18 NOTE — Progress Notes (Signed)
Patient ID: Joe [name], male   DOB: 1971/01/09, 52 y.o.   MRN: [id number]   Advanced Heart Failure VAD Team Note  PCP-Cardiologist: [name], MD   Subjective:    11/14: s/p HM3 LVAD implantation.   Milrinone 0.125. Co-ox 68%.  No dyspnea.  I/Os recorded negative but drinking a lot of water/eating ice and weight up.  CVP 18 this morning. MAP 70s-80s.  No dyspnea.   LVAD Interrogation HM3: Speed: 6100    Flow: 5.8    PI: 3.5    Power: 5      Objective:    Vital Signs:   Temp:  [97.6 F (36.4 C)-97.8 F (36.6 C)] 97.6 F (36.4 C) (11/29 0400) Resp:  [16-20] 16 (11/28 2329) BP: (90-117)/(62-103) 96/69 (11/29 0400) SpO2:  [93 %-97 %] 96 % (11/29 0400) Weight:  [144.4 kg] 144.4 kg (11/29 0339) Last BM Date : 05/17/23  Mean arterial Pressure 70s-80s  Intake/Output:   Intake/Output Summary (Last 24 hours) at 05/18/2023 0659 Last data filed at 05/18/2023 0600 Gross per 24 hour  Intake 2722.43 ml  Output 3950 ml  Net -1227.57 ml   Physical Exam    CVP 18 General: Well appearing this am. NAD.  HEENT: Normal. Neck: Supple, JVP 16+ cm. Carotids OK.  Cardiac:  Mechanical heart sounds with LVAD hum present.  Lungs:  CTAB, normal effort.  Abdomen:  NT, ND, no HSM. No bruits or masses. +BS  LVAD exit site: Well-healed and incorporated. Dressing dry and intact. No erythema or drainage. Stabilization device present and accurately applied. Driveline dressing changed daily per sterile technique. Extremities:  Warm and dry. No cyanosis, clubbing, rash. 1+ edema to thighs.  Neuro:  Alert & oriented x 3. Cranial nerves grossly intact. Moves all 4 extremities w/o difficulty. Affect pleasant    Telemetry   AV paced 80 (personally reviewed)  Labs   Basic Metabolic Panel: Recent Labs  Lab 05/14/23 0415 05/15/23 0247 05/16/23 0928 05/17/23 0400 05/18/23 0355  NA 129* 130* 132* 133* 133*  K 3.9 3.5 3.4* 3.9 3.7  CL 93* 95* 95* 99 99  CO2 27 27 25 25 26    GLUCOSE 133* 171* 234* 163* 230*  BUN 12 15 15 12 14   CREATININE 1.00 1.03 1.08 0.97 1.14  CALCIUM 8.0* 7.7* 7.7* 8.1* 7.8*  MG  --   --   --  2.4  --     Liver Function Tests: No results for input(s): "AST", "ALT", "ALKPHOS", "BILITOT", "PROT", "ALBUMIN" in the last 168 hours.  No results for input(s): "LIPASE", "AMYLASE" in the last 168 hours. No results for input(s): "AMMONIA" in the last 168 hours.  CBC: Recent Labs  Lab 05/14/23 0415 05/15/23 0247 05/16/23 0928 05/17/23 0400 05/18/23 0355  WBC 15.0* 14.1* 10.3 9.6 8.0  HGB 7.6* 7.6* 8.0* 8.9* 9.0*  HCT 24.6* 23.5* 25.6* 28.3* 29.3*  MCV 98.8 98.7 98.8 99.0 100.3*  PLT 335 359 381 371 357    INR: Recent Labs  Lab 05/14/23 0415 05/15/23 0247 05/16/23 0335 05/17/23 0400 05/18/23 0355  INR 1.7* 1.6* 1.6* 1.6* 1.7*    Other results:   Imaging   No results found.  Medications:    Scheduled Medications:  allopurinol  100 mg Oral Daily   amiodarone  200 mg Oral BID   aspirin EC  81 mg Oral Daily   Chlorhexidine Gluconate Cloth  6 each Topical Daily   digoxin  0.125 mg Oral Daily   escitalopram  10 mg  Oral QHS   ezetimibe  10 mg Oral Daily   Fe Fum-Vit C-Vit B12-FA  1 capsule Oral QPC breakfast   furosemide  80 mg Intravenous BID   gabapentin  400 mg Oral TID   insulin aspart  0-24 Units Subcutaneous TID WC & HS   insulin detemir  20 Units Subcutaneous BID   levothyroxine  25 mcg Oral Daily   metolazone  2.5 mg Oral Once   mexiletine  250 mg Oral Q12H   potassium chloride  40 mEq Oral Q3H   rosuvastatin  40 mg Oral Daily   sildenafil  20 mg Oral TID   sodium chloride flush  10-40 mL Intracatheter Q12H   sodium chloride flush  3 mL Intravenous Q12H   spironolactone  25 mg Oral Daily   Warfarin - Pharmacist Dosing Inpatient   Does not apply q1600    Infusions:  milrinone 0.125 mcg/kg/min (05/18/23 0240)     PRN Medications: acetaminophen, antiseptic oral rinse, dextrose, ondansetron (ZOFRAN)  IV, mouth rinse, oxyCODONE, polyvinyl alcohol, sodium chloride flush  Patient Profile   Joe [name] is a 52 y.o. male with h/o morbid obesity, DM2, HTN, HL, OSA, CAD and systolic HF.  Now s/p HM3 LVAD Implantation 11/14 by Dr. Donata Clay.  Assessment/Plan:    1.  Chronic Systolic HF s/p HM3 Implantation 11/14 - due to iCM - Echo 05/01/23: EF 20%, RV moderately down (improved) - RHC (9/24): elevated biventricular filling pressures with low PAPi; CI 2.2 - S/p HM III LVAD 05/03/23 by Dr. Maren Beach - Intra-op TEE - EF 20-25%, RV moderately reduced, IV septum with mild rightward bowing, AV opens intermittently - Limited Echo 11/18: LV decompressed, RV appeared dilated but difficult windows.  - Weaned from inotropes and pressors post-op, however developed worsening RV function. Co-ox 38. Restarted on Milrinone. - RAMP echo 11/21: Speeds left at 6000.  Speed increased to 6100 on 11/27. Today, I increased speed to 6200 with rise in flow to 5.9-6 L/min.   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  - Co-ox 68%.  Continue Milrinone 0.125 mcg/kg/min with need for further diuresis, can stop when volume status better. .  - Volume overloaded with CVP 18.  I/Os net negative but weight is up.  Still quite volume overloaded.  Lasix 80 mg IV bid + metolazone 2.5 x 1 today, will replace K aggressively.  - Continue Spiro to 25 mg  - INR 1.7  Warfarin d/w PharmD  - ASA at 81.   2. H/o VT Arrest  Hx PVCs - had VT arrest x 2 in 10/24 - Continue Amio 200 mg bid and Mexiletine 250 mg bid   3. CAD with chronic stable angina - s/p multiple stents to LAD and LCX - LHC (9/24) with stable 3v CAD - Continue ASA + Crestor 40mg  daily  4. AKI  - Resolved, creatinine 1.14.    5. DM2 - A1c 7.2 (1/24) - SSI   6. OSA - Not using CPAP, says machine was recalled a while ago. Needs new machine. - Followed outpatient   7. Hypokalemia - Give K with Lasix as above.   Length of Stay: 104  Marca Ancona,  MD 05/18/2023, 6:59 AM  VAD Team --- VAD ISSUES ONLY--- Pager [phone] (7am - 7am)  Advanced Heart Failure Team  Pager [phone] (M-F; 7a - 5p)  Please contact [facility] for night-coverage after hours (5p -7a ) and weekends on [email or web address]

## 2023-05-19 DIAGNOSIS — Z95811 Presence of heart assist device: Secondary | ICD-10-CM | POA: Diagnosis not present

## 2023-05-19 DIAGNOSIS — I5023 Acute on chronic systolic (congestive) heart failure: Secondary | ICD-10-CM | POA: Diagnosis not present

## 2023-05-19 LAB — BASIC METABOLIC PANEL
Anion gap: 8 (ref 5–15)
BUN: 15 mg/dL (ref 6–20)
CO2: 26 mmol/L (ref 22–32)
Calcium: 7.9 mg/dL — ABNORMAL LOW (ref 8.9–10.3)
Chloride: 101 mmol/L (ref 98–111)
Creatinine, Ser: 0.92 mg/dL (ref 0.61–1.24)
GFR, Estimated: >60 mL/min (ref >60)
Glucose, Bld: 125 mg/dL — ABNORMAL HIGH (ref 70–99)
Potassium: 4 mmol/L (ref 3.5–5.1)
Sodium: 135 mmol/L (ref 135–145)

## 2023-05-19 LAB — LACTATE DEHYDROGENASE: LDH: 197 U/L — ABNORMAL HIGH (ref 98–192)

## 2023-05-19 LAB — MAGNESIUM: Magnesium: 2.2 mg/dL (ref 1.7–2.4)

## 2023-05-19 LAB — GLUCOSE, CAPILLARY
Glucose-Capillary: 126 mg/dL — ABNORMAL HIGH (ref 70–99)
Glucose-Capillary: 180 mg/dL — ABNORMAL HIGH (ref 70–99)
Glucose-Capillary: 129 mg/dL — ABNORMAL HIGH (ref 70–99)
Glucose-Capillary: 159 mg/dL — ABNORMAL HIGH (ref 70–99)

## 2023-05-19 LAB — CBC
HCT: 29.8 % — ABNORMAL LOW (ref 39.0–52.0)
Hemoglobin: 9.1 g/dL — ABNORMAL LOW (ref 13.0–17.0)
MCH: 30.1 pg (ref 26.0–34.0)
MCHC: 30.5 g/dL (ref 30.0–36.0)
MCV: 98.7 fL (ref 80.0–100.0)
Platelets: 352 10*3/uL (ref 150–400)
RBC: 3.02 MIL/uL — ABNORMAL LOW (ref 4.22–5.81)
RDW: 17.6 % — ABNORMAL HIGH (ref 11.5–15.5)
WBC: 8.3 10*3/uL (ref 4.0–10.5)
nRBC: 0 % (ref 0.0–0.2)

## 2023-05-19 LAB — COOXEMETRY PANEL
Carboxyhemoglobin: 2.4 % — ABNORMAL HIGH (ref 0.5–1.5)
Methemoglobin: <0.7 % (ref 0.0–1.5)
O2 Saturation: 65.8 %
Total hemoglobin: 9.7 g/dL — ABNORMAL LOW (ref 12.0–16.0)

## 2023-05-19 LAB — PROTIME-INR
INR: 1.7 — ABNORMAL HIGH (ref 0.8–1.2)
Prothrombin Time: 20.4 s — ABNORMAL HIGH (ref 11.4–15.2)

## 2023-05-19 MED ORDER — FUROSEMIDE 10 MG/ML IJ SOLN
80.0000 mg | Freq: Once | INTRAMUSCULAR | Status: AC
  Administered 2023-05-19: 80 mg via INTRAVENOUS
  Filled 2023-05-19: qty 8

## 2023-05-19 MED ORDER — WARFARIN SODIUM 5 MG PO TABS
10.0000 mg | ORAL_TABLET | Freq: Once | ORAL | Status: AC
  Administered 2023-05-19: 10 mg via ORAL
  Filled 2023-05-19: qty 2

## 2023-05-19 MED ORDER — POTASSIUM CHLORIDE CRYS ER 20 MEQ PO TBCR
40.0000 meq | EXTENDED_RELEASE_TABLET | ORAL | Status: AC
  Administered 2023-05-19 (×2): 40 meq via ORAL
  Filled 2023-05-19 (×2): qty 2

## 2023-05-19 NOTE — Progress Notes (Signed)
Brief VAD rounding note:  VAD coordinator observed pt's wife Lenore perform dressing change today. She is now checked off to perform dressing changes independently. She will change pt's dressing tomorrow. Bedside RN aware.   Drive Line: Dressing change performed by [employer]. Existing VAD dressing removed and site care performed using sterile technique. Drive line exit site cleaned with Chlora prep applicators x 2, rinsed with sterile saline and allowed to dry. Silver strip applied flush with exit site. Exit site is unincorporated, the velour is fully implanted at exit site with 1 suture in place. Small amount serous/fat necrosis drainage. No redness, tenderness, foul odor or rash noted. There are 2 open sores from tape at the bottom of dressing. These were covered with 2 x 2 prior to placing tape. Drive line anchor re-applied. Continue daily dressing changes by nurse champion or VAD Coordinator. Next dressing change 05/20/23.       Plan/Recommendations:  1. Page VAD coordinators for any equipment or drive line issues 2. Daily drive line dressing changes per nurse champion or VAD Coordinator   Alyce Pagan RN VAD Coordinator  Office: [phone]  24/7 Pager: [phone]

## 2023-05-19 NOTE — Progress Notes (Signed)
Patient ID: Joe [name], male   DOB: June 12, 1971, 52 y.o.   MRN: [id number]   Advanced Heart Failure VAD Team Note  PCP-Cardiologist: [name], MD   Subjective:    11/14: s/p HM3 LVAD implantation.   - CVP 11 this AM; mixed venous 66% - Reports that he feels better; improved energy.   LVAD Interrogation HM3: Speed: 6200    Flow: 5.8    PI: 3.2    Power: 5      Objective:    Vital Signs:   Temp:  [97.7 F (36.5 C)-98.3 F (36.8 C)] 97.7 F (36.5 C) (11/30 0800) Pulse Rate:  [70-86] 70 (11/30 0800) Resp:  [18] 18 (11/30 0800) BP: (93-113)/(51-79) 111/79 (11/30 0800) SpO2:  [94 %-98 %] 98 % (11/30 0800) Weight:  [141.5 kg] 141.5 kg (11/30 0542) Last BM Date : 05/18/23  Mean arterial Pressure 70s-80s  Intake/Output:   Intake/Output Summary (Last 24 hours) at 05/19/2023 1149 Last data filed at 05/19/2023 0542 Gross per 24 hour  Intake 104.27 ml  Output 2350 ml  Net -2245.73 ml   Physical Exam    CVP 11 General: Well appearing this am. NAD.  HEENT: Normal. Neck: Supple, JVP 10 cm. Carotids OK.  Cardiac:  Mechanical heart sounds with LVAD hum present.  Lungs:  CTAB, normal effort.  Abdomen:  NT, ND, no HSM. No bruits or masses. +BS  LVAD exit site: Well-healed and incorporated. Dressing dry and intact. No erythema or drainage. Stabilization device present and accurately applied. Driveline dressing changed daily per sterile technique. Extremities:  Warm and dry. No cyanosis, clubbing, rash.+1 edema to knees Neuro:  Alert & oriented x 3. Cranial nerves grossly intact. Moves all 4 extremities w/o difficulty. Affect pleasant    Telemetry   AV paced 80 (personally reviewed)  Labs   Basic Metabolic Panel: Recent Labs  Lab 05/15/23 0247 05/16/23 0928 05/17/23 0400 05/18/23 0355 05/19/23 0547  NA 130* 132* 133* 133* 135  K 3.5 3.4* 3.9 3.7 4.0  CL 95* 95* 99 99 101  CO2 27 25 25 26 26   GLUCOSE 171* 234* 163* 230* 125*  BUN 15 15 12 14 15    CREATININE 1.03 1.08 0.97 1.14 0.92  CALCIUM 7.7* 7.7* 8.1* 7.8* 7.9*  MG  --   --  2.4  --  2.2    Liver Function Tests: No results for input(s): "AST", "ALT", "ALKPHOS", "BILITOT", "PROT", "ALBUMIN" in the last 168 hours.  No results for input(s): "LIPASE", "AMYLASE" in the last 168 hours. No results for input(s): "AMMONIA" in the last 168 hours.  CBC: Recent Labs  Lab 05/15/23 0247 05/16/23 0928 05/17/23 0400 05/18/23 0355 05/19/23 0547  WBC 14.1* 10.3 9.6 8.0 8.3  HGB 7.6* 8.0* 8.9* 9.0* 9.1*  HCT 23.5* 25.6* 28.3* 29.3* 29.8*  MCV 98.7 98.8 99.0 100.3* 98.7  PLT 359 381 371 357 352    INR: Recent Labs  Lab 05/15/23 0247 05/16/23 0335 05/17/23 0400 05/18/23 0355 05/19/23 0547  INR 1.6* 1.6* 1.6* 1.7* 1.7*    Other results:   Imaging   No results found.  Medications:    Scheduled Medications:  allopurinol  100 mg Oral Daily   amiodarone  200 mg Oral BID   aspirin EC  81 mg Oral Daily   Chlorhexidine Gluconate Cloth  6 each Topical Daily   digoxin  0.125 mg Oral Daily   escitalopram  10 mg Oral QHS   ezetimibe  10 mg Oral Daily  Fe Fum-Vit C-Vit B12-FA  1 capsule Oral QPC breakfast   furosemide  80 mg Intravenous Once   gabapentin  400 mg Oral TID   insulin aspart  0-24 Units Subcutaneous TID WC & HS   insulin detemir  20 Units Subcutaneous BID   levothyroxine  25 mcg Oral Daily   mexiletine  250 mg Oral Q12H   potassium chloride  40 mEq Oral Q4H   rosuvastatin  40 mg Oral Daily   sildenafil  20 mg Oral TID   sodium chloride flush  10-40 mL Intracatheter Q12H   sodium chloride flush  3 mL Intravenous Q12H   spironolactone  25 mg Oral Daily   warfarin  10 mg Oral ONCE-1600   Warfarin - Pharmacist Dosing Inpatient   Does not apply q1600    Infusions:     PRN Medications: acetaminophen, antiseptic oral rinse, dextrose, ondansetron (ZOFRAN) IV, mouth rinse, oxyCODONE, polyvinyl alcohol, sodium chloride flush  Patient Profile   Joe [name] is a 52 y.o. male with h/o morbid obesity, DM2, HTN, HL, OSA, CAD and systolic HF.  Now s/p HM3 LVAD Implantation 11/14 by Dr. Donata Clay.  Assessment/Plan:    1.  Chronic Systolic HF s/p HM3 Implantation 11/14 - due to iCM - Echo 05/01/23: EF 20%, RV moderately down (improved) - RHC (9/24): elevated biventricular filling pressures with low PAPi; CI 2.2 - S/p HM III LVAD 05/03/23 by Dr. Maren Beach - Intra-op TEE - EF 20-25%, RV moderately reduced, IV septum with mild rightward bowing, AV opens intermittently - Limited Echo 11/18: LV decompressed, RV appeared dilated but difficult windows.  - Weaned from inotropes and pressors post-op, however developed worsening RV function. Co-ox 38. Restarted on Milrinone. - RAMP echo 11/21: Speeds left at 6000.  Speed increased to 6100 on 11/27. Speed increased to 6200 on 05/18/23.  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  - Mixed venous 65%; CVP 10-11. IV lasix 80mg  this morning. D/C milrinone later today. Repeat coox this evening.  - Continue Spiro to 25 mg  - INR 1.7  Warfarin d/w PharmD  - ASA at 81.   2. H/o VT Arrest  Hx PVCs - had VT arrest x 2 in 10/24 - Continue Amio 200 mg bid and Mexiletine 250 mg bid   3. CAD with chronic stable angina - s/p multiple stents to LAD and LCX - LHC (9/24) with stable 3v CAD - Continue ASA + Crestor 40mg  daily  4. AKI  - Resolved   5. DM2 - A1c 7.2 (1/24) - SSI   6. OSA - Not using CPAP, says machine was recalled a while ago. Needs new machine. - Followed outpatient   7. Hypokalemia - Give K with Lasix as above.   Length of Stay: [address] DO 05/19/2023, 11:49 AM  VAD Team --- VAD ISSUES ONLY--- Pager [phone] (7am - 7am)  Advanced Heart Failure Team  Pager [phone] (M-F; 7a - 5p)  Please contact [facility] for night-coverage after hours (5p -7a ) and weekends on [email or web address]

## 2023-05-19 NOTE — Progress Notes (Signed)
PHARMACY - ANTICOAGULATION CONSULT NOTE  Pharmacy Consult for warfarin Indication: LVAD HM3 implanted 11/14  Allergies  Allergen Reactions   Tramadol Other (See Comments)    Hallucinations    Patient Measurements: Height: 6\' 3"  (190.5 cm) Weight: (!) 141.5 kg (311 lb 14.4 oz) IBW/kg (Calculated) : 84.5   Vital Signs: Temp: 97.7 F (36.5 C) (11/30 0800) Temp Source: Oral (11/30 0800) BP: 111/79 (11/30 0800) Pulse Rate: 70 (11/30 0800)  Labs: Recent Labs    05/17/23 0400 05/18/23 0355 05/19/23 0547  HGB 8.9* 9.0* 9.1*  HCT 28.3* 29.3* 29.8*  PLT 371 357 352  LABPROT 19.2* 19.9* 20.4*  INR 1.6* 1.7* 1.7*  CREATININE 0.97 1.14 0.92    Estimated Creatinine Clearance: 142.5 mL/min (by C-G formula based on SCr of 0.92 mg/dL).   Medical History: Past Medical History:  Diagnosis Date   Abnormal EKG    Acute systolic heart failure (HCC) 09/06/2017   Angina pectoris (HCC) 09/05/2017   Body mass index 45.0-49.9, adult (HCC)    CAD S/P percutaneous coronary angioplasty 09/07/2017   mLAD and dLAD PCI with DES 09/06/17   CHF (congestive heart failure) (HCC)    Chronic systolic (congestive) heart failure (HCC) 03/19/2019   Complication of anesthesia 1995   "had hard time waking up; breathing w/vasectomy"   Coronary artery disease    Erectile dysfunction    Essential hypertension, benign    Fatigue    GERD (gastroesophageal reflux disease)    Gout    High cholesterol    "just started tx yesterday" (09/06/2017)   History of gout    "RX prn" (09/06/2017)   Ischemic cardiomyopathy 09/07/2017   EF 25-35%   Muscular chest pain    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   Below Left Nare (MOH's)   Obesity    Obstructive sleep apnea    OSA on CPAP    Plantar fasciitis    Pre-operative clearance 02/11/2018   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/02/2019   [employer]   Right bundle branch block    Shortness of breath 09/05/2017    Testosterone deficiency    Type 2 diabetes mellitus with complication, without long-term current use of insulin (HCC) 09/05/2017   Type II diabetes mellitus (HCC)    "started tx 08/28/2017"    Assessment: 52yom with heart failure s/p LVAD implant HM3 11/14. INR slightly elevated at baseline (1.6).   INR 1.7 is subtherapeutic today, slight increase in INR after 3 days of 7.5mg  doses. Hgb (9.1), pltc (352), LDH (197) are all stable. Eating 100% of meals per chart. No new DDIs noted (continues on amiodarone 200 mg BID).  Goal of Therapy:  INR 2-2.5 Monitor platelets by anticoagulation protocol: Yes   Plan:  Warfarin 10 mg x1 tonight Daily PT/INR Monitor s/s bleeding    Thank you for allowing pharmacy to participate in this patient's care,  Wilmer Floor, PharmD PGY2 Cardiology Pharmacy Resident  Please check AMION for all [facility] Pharmacy phone numbers After 10:00 PM, call Main Pharmacy [phone] 05/19/2023  10:06 AM

## 2023-05-19 NOTE — Progress Notes (Signed)
  CARDIAC REHAB PHASE I    Pt with LVAD, battery to wall unit. Asked RN to hook to portable battery and ambulate pt around the hallway. Pt walking around in room. Reviewed education with pt, answered all questions and spoke to him about Cardiac Rehab. Pt is interested in coming to [facility]. Pt stated he will not be discharged today. Will continue to follow.    Service time is from 1035 to 1045  Essie Hart, Charity fundraiser, BSN Cardiac and Pulmonary Rehab

## 2023-05-20 DIAGNOSIS — I5023 Acute on chronic systolic (congestive) heart failure: Secondary | ICD-10-CM | POA: Diagnosis not present

## 2023-05-20 DIAGNOSIS — Z95811 Presence of heart assist device: Secondary | ICD-10-CM | POA: Diagnosis not present

## 2023-05-20 LAB — CBC
HCT: 32.3 % — ABNORMAL LOW (ref 39.0–52.0)
Hemoglobin: 10 g/dL — ABNORMAL LOW (ref 13.0–17.0)
MCH: 30.3 pg (ref 26.0–34.0)
MCHC: 31 g/dL (ref 30.0–36.0)
MCV: 97.9 fL (ref 80.0–100.0)
Platelets: 355 10*3/uL (ref 150–400)
RBC: 3.3 MIL/uL — ABNORMAL LOW (ref 4.22–5.81)
RDW: 17.2 % — ABNORMAL HIGH (ref 11.5–15.5)
WBC: 7.8 10*3/uL (ref 4.0–10.5)
nRBC: 0 % (ref 0.0–0.2)

## 2023-05-20 LAB — GLUCOSE, CAPILLARY
Glucose-Capillary: 113 mg/dL — ABNORMAL HIGH (ref 70–99)
Glucose-Capillary: 158 mg/dL — ABNORMAL HIGH (ref 70–99)
Glucose-Capillary: 134 mg/dL — ABNORMAL HIGH (ref 70–99)
Glucose-Capillary: 196 mg/dL — ABNORMAL HIGH (ref 70–99)

## 2023-05-20 LAB — COOXEMETRY PANEL
Carboxyhemoglobin: 1.7 % — ABNORMAL HIGH (ref 0.5–1.5)
Methemoglobin: <0.7 % (ref 0.0–1.5)
O2 Saturation: 56.7 %
Total hemoglobin: 10.6 g/dL — ABNORMAL LOW (ref 12.0–16.0)

## 2023-05-20 LAB — BASIC METABOLIC PANEL
Anion gap: 9 (ref 5–15)
BUN: 16 mg/dL (ref 6–20)
CO2: 26 mmol/L (ref 22–32)
Calcium: 8.5 mg/dL — ABNORMAL LOW (ref 8.9–10.3)
Chloride: 99 mmol/L (ref 98–111)
Creatinine, Ser: 0.87 mg/dL (ref 0.61–1.24)
GFR, Estimated: >60 mL/min (ref >60)
Glucose, Bld: 116 mg/dL — ABNORMAL HIGH (ref 70–99)
Potassium: 4.1 mmol/L (ref 3.5–5.1)
Sodium: 134 mmol/L — ABNORMAL LOW (ref 135–145)

## 2023-05-20 LAB — PROTIME-INR
INR: 1.8 — ABNORMAL HIGH (ref 0.8–1.2)
Prothrombin Time: 20.9 s — ABNORMAL HIGH (ref 11.4–15.2)

## 2023-05-20 LAB — LACTATE DEHYDROGENASE: LDH: 220 U/L — ABNORMAL HIGH (ref 98–192)

## 2023-05-20 MED ORDER — FUROSEMIDE 10 MG/ML IJ SOLN
80.0000 mg | Freq: Once | INTRAMUSCULAR | Status: AC
  Administered 2023-05-20: 80 mg via INTRAVENOUS
  Filled 2023-05-20: qty 8

## 2023-05-20 MED ORDER — GERHARDT'S BUTT CREAM
TOPICAL_CREAM | Freq: Two times a day (BID) | CUTANEOUS | Status: DC
  Filled 2023-05-20: qty 1

## 2023-05-20 MED ORDER — POTASSIUM CHLORIDE CRYS ER 20 MEQ PO TBCR
40.0000 meq | EXTENDED_RELEASE_TABLET | ORAL | Status: AC
  Administered 2023-05-20 (×2): 40 meq via ORAL
  Filled 2023-05-20 (×2): qty 2

## 2023-05-20 MED ORDER — FUROSEMIDE 10 MG/ML IJ SOLN
40.0000 mg | Freq: Once | INTRAMUSCULAR | Status: AC
  Administered 2023-05-20: 40 mg via INTRAVENOUS
  Filled 2023-05-20: qty 4

## 2023-05-20 MED ORDER — WARFARIN SODIUM 5 MG PO TABS
10.0000 mg | ORAL_TABLET | Freq: Once | ORAL | Status: AC
  Administered 2023-05-20: 10 mg via ORAL
  Filled 2023-05-20: qty 2

## 2023-05-20 NOTE — Progress Notes (Signed)
Patient ID: Joe [name], male   DOB: [birth year], 52 y.o.   MRN: [id number]   Advanced Heart Failure VAD Team Note  PCP-Cardiologist: [name], MD   Subjective:    11/14: s/p HM3 LVAD implantation.   - CVP 11 this AM; mixed venous 66% - Reports that he feels better; improved energy.   LVAD Interrogation HM3: Speed: 6200    Flow: 5.9   PI: 3.9    Power: 5.3    10 PI events  Objective:    Vital Signs:   Temp:  [97.6 F (36.4 C)-98.7 F (37.1 C)] 98.2 F (36.8 C) (12/01 0837) Pulse Rate:  [71-85] 71 (12/01 0540) Resp:  [16-20] 20 (12/01 0540) BP: (90-114)/(71-89) 104/89 (12/01 0837) SpO2:  [93 %-98 %] 95 % (12/01 0837) Weight:  [139.6 kg] 139.6 kg (12/01 0540) Last BM Date : 05/18/23  Mean arterial Pressure 70s-80s  Intake/Output:   Intake/Output Summary (Last 24 hours) at 05/20/2023 1013 Last data filed at 05/20/2023 0700 Gross per 24 hour  Intake 960 ml  Output 4675 ml  Net -3715 ml   Physical Exam    CVP 10-11 General: Well appearing this am. NAD.  HEENT: Normal. Neck: Supple, JVP 8 Cardiac:  Mechanical heart sounds with LVAD hum present.  Lungs:  CTAB, normal effort.  Abdomen:  NT, ND, no HSM. No bruits or masses. +BS  LVAD exit site: Well-healed and incorporated. Dressing dry and intact. No erythema or drainage. Stabilization device present and accurately applied. Driveline dressing changed daily per sterile technique. Extremities:  Warm and dry. No cyanosis, clubbing, rash.+1 edema to knees Neuro:  Alert & oriented x 3. Cranial nerves grossly intact. Moves all 4 extremities w/o difficulty. Affect pleasant    Telemetry   AV paced 80 (personally reviewed)  Labs   Basic Metabolic Panel: Recent Labs  Lab 05/16/23 0928 05/17/23 0400 05/18/23 0355 05/19/23 0547 05/20/23 0555  NA 132* 133* 133* 135 134*  K 3.4* 3.9 3.7 4.0 4.1  CL 95* 99 99 101 99  CO2 25 25 26 26 26   GLUCOSE 234* 163* 230* 125* 116*  BUN 15 12 14 15 16   CREATININE  1.08 0.97 1.14 0.92 0.87  CALCIUM 7.7* 8.1* 7.8* 7.9* 8.5*  MG  --  2.4  --  2.2  --     CBC: Recent Labs  Lab 05/16/23 0928 05/17/23 0400 05/18/23 0355 05/19/23 0547 05/20/23 0555  WBC 10.3 9.6 8.0 8.3 7.8  HGB 8.0* 8.9* 9.0* 9.1* 10.0*  HCT 25.6* 28.3* 29.3* 29.8* 32.3*  MCV 98.8 99.0 100.3* 98.7 97.9  PLT 381 371 357 352 355    INR: Recent Labs  Lab 05/16/23 0335 05/17/23 0400 05/18/23 0355 05/19/23 0547 05/20/23 0555  INR 1.6* 1.6* 1.7* 1.7* 1.8*    Other results:   Imaging   No results found.  Medications:    Scheduled Medications:  allopurinol  100 mg Oral Daily   amiodarone  200 mg Oral BID   aspirin EC  81 mg Oral Daily   Chlorhexidine Gluconate Cloth  6 each Topical Daily   digoxin  0.125 mg Oral Daily   escitalopram  10 mg Oral QHS   ezetimibe  10 mg Oral Daily   Fe Fum-Vit C-Vit B12-FA  1 capsule Oral QPC breakfast   furosemide  40 mg Intravenous Once   furosemide  80 mg Intravenous Once   gabapentin  400 mg Oral TID   insulin aspart  0-24 Units Subcutaneous TID WC &  HS   insulin detemir  20 Units Subcutaneous BID   levothyroxine  25 mcg Oral Daily   mexiletine  250 mg Oral Q12H   rosuvastatin  40 mg Oral Daily   sildenafil  20 mg Oral TID   sodium chloride flush  10-40 mL Intracatheter Q12H   sodium chloride flush  3 mL Intravenous Q12H   spironolactone  25 mg Oral Daily   warfarin  10 mg Oral ONCE-1600   Warfarin - Pharmacist Dosing Inpatient   Does not apply q1600    Infusions:     PRN Medications: acetaminophen, antiseptic oral rinse, dextrose, ondansetron (ZOFRAN) IV, mouth rinse, oxyCODONE, polyvinyl alcohol, sodium chloride flush  Patient Profile   Joe [name] is a 52 y.o. male with h/o morbid obesity, DM2, HTN, HL, OSA, CAD and systolic HF.  Now s/p HM3 LVAD Implantation 11/14 by Dr. Donata Clay.  Assessment/Plan:    1.  Chronic Systolic HF s/p HM3 Implantation 11/14 - due to iCM - Echo 05/01/23: EF 20%, RV  moderately down (improved) - RHC (9/24): elevated biventricular filling pressures with low PAPi; CI 2.2 - S/p HM III LVAD 05/03/23 by Dr. Maren Beach - Intra-op TEE - EF 20-25%, RV moderately reduced, IV septum with mild rightward bowing, AV opens intermittently - Limited Echo 11/18: LV decompressed, RV appeared dilated but difficult windows.  - RAMP echo 11/21: Speeds left at 6000.  Speed increased to 6100 on 11/27. Speed increased to 6200 on 05/18/23.  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  - Mixed venous 57% today; off milrinone.  - Mildly hypervolemic on exam; CVP 11; IV lasix 80mg  this AM, 40mg  tonight. INR 1.8. MAPs 80s.  - Continue Spiro to 25 mg  - INR 1.8  Warfarin d/w PharmD  - ASA at 81.   2. H/o VT Arrest  Hx PVCs - had VT arrest x 2 in 10/24 - Continue Amio 200 mg bid and Mexiletine 250 mg bid   3. CAD with chronic stable angina - s/p multiple stents to LAD and LCX - LHC (9/24) with stable 3v CAD - Continue ASA + Crestor 40mg  daily  4. AKI  - Resolved   5. DM2 - A1c 7.2 (1/24) - SSI   6. OSA - Not using CPAP, says machine was recalled a while ago. Needs new machine. - Followed outpatient   7. Hypokalemia - Give K with Lasix as above.   Length of Stay: [address], DO 05/20/2023, 10:13 AM  VAD Team --- VAD ISSUES ONLY--- Pager [phone] (7am - 7am)  Advanced Heart Failure Team  Pager [phone] (M-F; 7a - 5p)  Please contact [facility] for night-coverage after hours (5p -7a ) and weekends on [email or web address]

## 2023-05-20 NOTE — Progress Notes (Signed)
PHARMACY - ANTICOAGULATION CONSULT NOTE  Pharmacy Consult for warfarin Indication: LVAD HM3 implanted 11/14  Allergies  Allergen Reactions   Tramadol Other (See Comments)    Hallucinations    Patient Measurements: Height: 6\' 3"  (190.5 cm) Weight: (!) 139.6 kg (307 lb 12.2 oz) IBW/kg (Calculated) : 84.5   Vital Signs: Temp: 98.2 F (36.8 C) (12/01 1110) Temp Source: Oral (12/01 1110) BP: 104/89 (12/01 0837) Pulse Rate: 71 (12/01 0540)  Labs: Recent Labs    05/18/23 0355 05/19/23 0547 05/20/23 0555  HGB 9.0* 9.1* 10.0*  HCT 29.3* 29.8* 32.3*  PLT 357 352 355  LABPROT 19.9* 20.4* 20.9*  INR 1.7* 1.7* 1.8*  CREATININE 1.14 0.92 0.87    Estimated Creatinine Clearance: 149.6 mL/min (by C-G formula based on SCr of 0.87 mg/dL).   Medical History: Past Medical History:  Diagnosis Date   Abnormal EKG    Acute systolic heart failure (HCC) 09/06/2017   Angina pectoris (HCC) 09/05/2017   Body mass index 45.0-49.9, adult (HCC)    CAD S/P percutaneous coronary angioplasty 09/07/2017   mLAD and dLAD PCI with DES 09/06/17   CHF (congestive heart failure) (HCC)    Chronic systolic (congestive) heart failure (HCC) 03/19/2019   Complication of anesthesia 1995   "had hard time waking up; breathing w/vasectomy"   Coronary artery disease    Erectile dysfunction    Essential hypertension, benign    Fatigue    GERD (gastroesophageal reflux disease)    Gout    High cholesterol    "just started tx yesterday" (09/06/2017)   History of gout    "RX prn" (09/06/2017)   Ischemic cardiomyopathy 09/07/2017   EF 25-35%   Muscular chest pain    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   Below Left Nare (MOH's)   Obesity    Obstructive sleep apnea    OSA on CPAP    Plantar fasciitis    Pre-operative clearance 02/11/2018   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/02/2019   Saint Jude   Right bundle branch block    Shortness of breath 09/05/2017    Testosterone deficiency    Type 2 diabetes mellitus with complication, without long-term current use of insulin (HCC) 09/05/2017   Type II diabetes mellitus (HCC)    "started tx 08/28/2017"    Assessment: Joe [name] with heart failure s/p LVAD implant HM3 11/14. INR slightly elevated at baseline (1.6).   INR 1.8 is subtherapeutic today. Hgb, pltc, and LDH are all stable. Eating 100% of meals per chart. No new DDIs noted (continues on amiodarone 200 mg BID).  Goal of Therapy:  INR 2-2.5 Monitor platelets by anticoagulation protocol: Yes   Plan:  Warfarin 10 mg x1 tonight Daily PT/INR Monitor s/s bleeding    Thank you for allowing pharmacy to participate in this patient's care,  Wilmer Floor, PharmD PGY2 Cardiology Pharmacy Resident  Please check AMION for all [facility] Pharmacy phone numbers After 10:00 PM, call Main Pharmacy [phone] 05/20/2023  2:36 PM

## 2023-05-21 ENCOUNTER — Other Ambulatory Visit (HOSPITAL_COMMUNITY): Payer: Self-pay

## 2023-05-21 ENCOUNTER — Encounter (HOSPITAL_COMMUNITY): Payer: Self-pay | Admitting: Unknown Physician Specialty

## 2023-05-21 ENCOUNTER — Encounter: Payer: Self-pay | Admitting: Unknown Physician Specialty

## 2023-05-21 DIAGNOSIS — I5023 Acute on chronic systolic (congestive) heart failure: Secondary | ICD-10-CM | POA: Diagnosis not present

## 2023-05-21 DIAGNOSIS — Z95811 Presence of heart assist device: Secondary | ICD-10-CM

## 2023-05-21 DIAGNOSIS — I509 Heart failure, unspecified: Secondary | ICD-10-CM

## 2023-05-21 DIAGNOSIS — I255 Ischemic cardiomyopathy: Secondary | ICD-10-CM

## 2023-05-21 LAB — LACTATE DEHYDROGENASE: LDH: 190 U/L (ref 98–192)

## 2023-05-21 LAB — BASIC METABOLIC PANEL
Anion gap: 6 (ref 5–15)
BUN: 17 mg/dL (ref 6–20)
CO2: 29 mmol/L (ref 22–32)
Calcium: 8.3 mg/dL — ABNORMAL LOW (ref 8.9–10.3)
Chloride: 100 mmol/L (ref 98–111)
Creatinine, Ser: 1.19 mg/dL (ref 0.61–1.24)
GFR, Estimated: >60 mL/min (ref >60)
Glucose, Bld: 124 mg/dL — ABNORMAL HIGH (ref 70–99)
Potassium: 4.1 mmol/L (ref 3.5–5.1)
Sodium: 135 mmol/L (ref 135–145)

## 2023-05-21 LAB — CBC
HCT: 31.2 % — ABNORMAL LOW (ref 39.0–52.0)
Hemoglobin: 9.5 g/dL — ABNORMAL LOW (ref 13.0–17.0)
MCH: 30.1 pg (ref 26.0–34.0)
MCHC: 30.4 g/dL (ref 30.0–36.0)
MCV: 98.7 fL (ref 80.0–100.0)
Platelets: 284 10*3/uL (ref 150–400)
RBC: 3.16 MIL/uL — ABNORMAL LOW (ref 4.22–5.81)
RDW: 17.1 % — ABNORMAL HIGH (ref 11.5–15.5)
WBC: 7.5 10*3/uL (ref 4.0–10.5)
nRBC: 0 % (ref 0.0–0.2)

## 2023-05-21 LAB — GLUCOSE, CAPILLARY
Glucose-Capillary: 133 mg/dL — ABNORMAL HIGH (ref 70–99)
Glucose-Capillary: 159 mg/dL — ABNORMAL HIGH (ref 70–99)

## 2023-05-21 LAB — COOXEMETRY PANEL
Carboxyhemoglobin: 2.1 % — ABNORMAL HIGH (ref 0.5–1.5)
Methemoglobin: <0.7 % (ref 0.0–1.5)
O2 Saturation: 60.2 %
Total hemoglobin: 10 g/dL — ABNORMAL LOW (ref 12.0–16.0)

## 2023-05-21 LAB — PROTIME-INR
INR: 1.9 — ABNORMAL HIGH (ref 0.8–1.2)
Prothrombin Time: 21.9 s — ABNORMAL HIGH (ref 11.4–15.2)

## 2023-05-21 MED ORDER — DIGOXIN 125 MCG PO TABS
0.1250 mg | ORAL_TABLET | Freq: Every day | ORAL | 5 refills | Status: DC
  Filled 2023-05-21: qty 30, 30d supply, fill #0

## 2023-05-21 MED ORDER — WARFARIN SODIUM 5 MG PO TABS
7.5000 mg | ORAL_TABLET | Freq: Every day | ORAL | 5 refills | Status: DC
  Filled 2023-05-21: qty 60, 40d supply, fill #0

## 2023-05-21 MED ORDER — TORSEMIDE 20 MG PO TABS
60.0000 mg | ORAL_TABLET | Freq: Two times a day (BID) | ORAL | 1 refills | Status: DC
  Filled 2023-05-21: qty 540, 90d supply, fill #0

## 2023-05-21 MED ORDER — FE FUM-VIT C-VIT B12-FA 460-60-0.01-1 MG PO CAPS
1.0000 | ORAL_CAPSULE | Freq: Every day | ORAL | 5 refills | Status: DC
  Filled 2023-05-21: qty 30, 30d supply, fill #0

## 2023-05-21 MED ORDER — SILDENAFIL CITRATE 20 MG PO TABS
20.0000 mg | ORAL_TABLET | Freq: Three times a day (TID) | ORAL | 5 refills | Status: DC
  Filled 2023-05-21: qty 15, 30d supply, fill #0

## 2023-05-21 MED ORDER — AMIODARONE HCL 200 MG PO TABS
200.0000 mg | ORAL_TABLET | Freq: Two times a day (BID) | ORAL | 5 refills | Status: DC
  Filled 2023-05-21: qty 60, 30d supply, fill #0

## 2023-05-21 MED ORDER — POTASSIUM CHLORIDE CRYS ER 20 MEQ PO TBCR
60.0000 meq | EXTENDED_RELEASE_TABLET | Freq: Two times a day (BID) | ORAL | 1 refills | Status: DC
  Filled 2023-05-21: qty 540, 90d supply, fill #0

## 2023-05-21 MED ORDER — WARFARIN SODIUM 5 MG PO TABS
10.0000 mg | ORAL_TABLET | Freq: Once | ORAL | Status: AC
  Administered 2023-05-21: 10 mg via ORAL
  Filled 2023-05-21: qty 2

## 2023-05-21 MED ORDER — ASPIRIN EC 81 MG PO TBEC: 81.0000 mg | DELAYED_RELEASE_TABLET | Freq: Every day | ORAL | 0 refills | Status: AC

## 2023-05-21 MED ORDER — WARFARIN SODIUM 5 MG PO TABS: 10.0000 mg | ORAL_TABLET | Freq: Once | ORAL | Status: DC

## 2023-05-21 MED ORDER — EZETIMIBE 10 MG PO TABS
10.0000 mg | ORAL_TABLET | Freq: Every day | ORAL | 5 refills | Status: DC
  Filled 2023-05-21: qty 30, 30d supply, fill #0

## 2023-05-21 MED ORDER — FUROSEMIDE 10 MG/ML IJ SOLN
80.0000 mg | Freq: Once | INTRAMUSCULAR | Status: AC
  Administered 2023-05-21: 80 mg via INTRAVENOUS
  Filled 2023-05-21: qty 8

## 2023-05-21 NOTE — TOC Progression Note (Addendum)
Transition of Care [facility]) - Progression Note    Patient Details  Name: Joe [name] MRN: [id number] Date of Birth: [birth year]  [facility] CM/SW Contact  Nicanor Bake Phone Number: [phone] 05/21/2023, 11:04 AM  Clinical Narrative:   HF CSW  called to schedule pts Hospital follow up appointment scheduled for Thursday, June 07, 2023 at 10:30 AM.   HF CSW met with pt at bedside. Pt stated that his wife will be arriving shortly and she will be transporting him home. CSW informed the pt of his hospital follow up appts. Pt agree.   TOC will continue following.     Expected Discharge Plan: Home w Home Health Services Barriers to Discharge: Continued Medical Work up  Expected Discharge Plan and Services   Discharge Planning Services: CM Consult Post Acute Care Choice: IP Rehab Living arrangements for the past 2 months: Single Family Home Expected Discharge Date: 05/21/23                           [facility] Agency: [facility] Date HH Agency Contacted: 05/14/23 Time HH Agency Contacted: [phone] Representative spoke with at [facility] Agency: Adele Dan   Social Determinants of Health (SDOH) Interventions SDOH Screenings   Food Insecurity: No Food Insecurity (04/30/2023)  Housing: Low Risk  (04/30/2023)  Transportation Needs: No Transportation Needs (04/30/2023)  Utilities: Not At Risk (04/30/2023)  Financial Resource Strain: Low Risk  (04/20/2023)  Social Connections: Unknown (10/28/2021)   [facility] from [facility]  Tobacco Use: Low Risk  (05/03/2023)    Readmission Risk Interventions     No data to display

## 2023-05-21 NOTE — Progress Notes (Signed)
LVAD Coordinator Rounding Note:   Admitted 04/30/23 due to for optimization prior to VAD.    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   Pt sitting up in bed on my arrival. States he is feeling good this morning and feels ready for discharge.   VAD Education completed 05/11/23 with pt and wife Lenore at bedside. Please see separate note for details. Home equipment arrived and checked in. Wife Lenore checked off on dressing change 05/19/23.  Follow up in VAD Clinic made 05/30/23 at 1pm.    Vital signs: Temp: 97.8 HR: 75 Doppler Pressure:  Automatic BP: 106/89 (94) O2 Sat: 99% RA Wt: 316.4>305.5>317.9>315.7>313>304.2>311.1>316.4>314.8>318.3>311.9>307.8>305.6 lbs     LVAD interrogation reveals:  Speed: 6200 Flow: 5.9 Power: 5.2 w PI: 3.3  Alarms: none Events: no PI events observed  Hematocrit: 29  Fixed speed: 6200 Low speed limit: 5900   Drive Line: Dressing change performed by pt's wife Lenore. Existing VAD dressing removed and site care performed using sterile technique. Drive line exit site cleaned with Chlora prep applicators x 2, rinsed with sterile saline and allowed to dry. Silver strip applied flush with exit site. Exit site is unincorporated, the velour is fully implanted at exit site with 1 suture in place. Moderate amount serous/fat necrosis drainage. No redness, tenderness, foul odor or rash noted. There are 2 open sores from tape at the bottom of dressing. These were covered with 2 x 2 prior to placing tape. Drive line anchor re-applied. Continue daily dressing changes by nurse champion or VAD Coordinator. Next dressing change 05/23/23.    Labs:  LDH trend: 275>251>260>247>251>238>210>222>223>206>190   Hgb trend: 9.9>8>8.1>8>8.3>8.5>7.6>7.6>8>9.0>9.5   INR trend: 1.3>1.4>2.0>2.6>2.1>2.0>1.7>1.6>1.7>1.9   Anticoagulation Plan: -INR Goal: 2.0-2.5 -ASA Dose: 81 mg stop date of 06/02/23   Blood Products:   Intra-op- 6 FFP 1 PLT 2 CYRO   05/05/23>>1 u/PRBC 05/16/23>>1 u/PRBC   Device: -St Jude BiV - Demand rate decreased to 60 05/16/23 -Therapies: ON   Arrythmias:    Respiratory: Extubated 05/04/23   Infection:    Renal:  -BUN/CRT: 16/1.42>21/1.14>15/1.04>11/1.05>14/.87>14/1.13>1.0>1.03>1.08>14/1.14>17/1.19   Gtts: Milrinone 0.125 mcg/kg/min-OFF   VAD Education: Discharge teaching completed 05/11/23 See separate note for details Wife Lenore checked off on dressing change 05/19/23  Adverse Events on VAD:   Plan/Recommendations:  1. Page VAD coordinators for any equipment or drive line issues 2. Daily drive line dressing changes per nurse champion or VAD Coordinator   Simmie Davies RN,BSN VAD Coordinator  Office: [phone]  24/7 Pager: [phone]

## 2023-05-21 NOTE — Progress Notes (Signed)
   Palliative Medicine Inpatient Follow Up Note HPI: Joe [name] is a 52 y.o. male with medical history significant for chronic systolic heart failure in setting of ischemic cardiomyopathy, V-fib arrest, hyperlipidemia, obstructive sleep apnea on home nocturnal CPAP, type 2 diabetes mellitus. The Palliative medicine team has been asked to assist in VAD evaluation.   Today's Discussion 05/21/2023  *Please note that this is a verbal dictation therefore any spelling or grammatical errors are due to the "Dragon Medical One" system interpretation.  Chart reviewed inclusive of vital signs, progress notes, laboratory results, and diagnostic images. Per chart review patients weight is now at his pre-op weight.   I met with Joe [name] this morning with Joe [name]. He shares that plan for discharge home this afternoon. He is elated to be discharging. He shares that his bottom is still hurting though he looks forward to getting home and being up and about more.   Patient has received ongoing education from the VAD team regarding ongoing management and care.   Palliative Support Provided.   Objective Assessment: Vital Signs Vitals:   05/21/23 0755 05/21/23 1003  BP: 106/86   Pulse:  73  Resp: 20   Temp: 97.8 F (36.6 C)   SpO2: 99%     Intake/Output Summary (Last 24 hours) at 05/21/2023 1206 Last data filed at 05/21/2023 0700 Gross per 24 hour  Intake 903.35 ml  Output 3725 ml  Net -2821.65 ml   Last Weight  Most recent update: 05/21/2023  5:48 AM    Weight  138.6 kg (305 lb 8.9 oz)              SUMMARY OF RECOMMENDATIONS   Full Code / Full Scope of Care  S/P LVAD 11/14    Plan for discharge today  Time: 25 ______________________________________________________________________________________ Joe [name] [place] Palliative Medicine Team Team Cell Phone: [phone] Please utilize secure chat with additional questions, if there is no response within 30 minutes  please call the above phone number  Palliative Medicine Team providers are available by phone from 7am to 7pm daily and can be reached through the team cell phone.  Should this patient require assistance outside of these hours, please call the patient's attending physician.

## 2023-05-21 NOTE — TOC Transition Note (Signed)
Transition of Care [facility]) - CM/SW Discharge Note   Patient Details  Name: Joe [name] MRN: [id number] Date of Birth: 1970/07/09  Transition [facility] Care [facility]) CM/SW Contact:  Harriet Masson, RN Phone Number: 05/21/2023, 1:34 PM   Clinical Narrative:    Patient prefers OP PT vs Home health. Referral sent to [facility] rehab on [address].    Final next level of care: OP Rehab Barriers to Discharge: No Barriers Identified   Patient Goals and CMS Choice CMS Medicare.gov Compare Post Acute Care list provided to:: Patient Represenative (must comment) Choice offered to / list presented to : Parent  Discharge Placement                         Discharge Plan and Services Additional resources added to the After Visit Summary for     Discharge Planning Services: CM Consult Post Acute Care Choice: IP Rehab                      [facility] Agency: [facility] Date [facility] Agency Contacted: 05/14/23 Time HH Agency Contacted: [phone] Representative spoke with at [facility] Agency: Adele Dan  Social Determinants of Health (SDOH) Interventions SDOH Screenings   Food Insecurity: No Food Insecurity (04/30/2023)  Housing: Low Risk  (04/30/2023)  Transportation Needs: No Transportation Needs (04/30/2023)  Utilities: Not At Risk (04/30/2023)  Financial Resource Strain: Low Risk  (04/20/2023)  Social Connections: Unknown (10/28/2021)   Received from [facility]  Tobacco Use: Low Risk  (05/03/2023)     Readmission Risk Interventions     No data to display

## 2023-05-21 NOTE — Plan of Care (Signed)
  Problem: Cardiovascular: Goal: Ability to achieve and maintain adequate cardiovascular perfusion will improve Outcome: Adequate for Discharge Goal: Vascular access site(s) Level 0-1 will be maintained Outcome: Adequate for Discharge   Problem: Clinical Measurements: Goal: Ability to maintain clinical measurements within normal limits will improve Outcome: Adequate for Discharge Goal: Will remain free from infection Outcome: Adequate for Discharge Goal: Diagnostic test results will improve Outcome: Adequate for Discharge Goal: Respiratory complications will improve Outcome: Adequate for Discharge Goal: Cardiovascular complication will be avoided Outcome: Adequate for Discharge   Problem: Activity: Goal: Risk for activity intolerance will decrease Outcome: Adequate for Discharge   Problem: Coping: Goal: Level of anxiety will decrease Outcome: Adequate for Discharge   Problem: Elimination: Goal: Will not experience complications related to bowel motility Outcome: Adequate for Discharge Goal: Will not experience complications related to urinary retention Outcome: Adequate for Discharge   Problem: Pain Management: Goal: General experience of comfort will improve Outcome: Adequate for Discharge   Problem: Safety: Goal: Ability to remain free from injury will improve Outcome: Adequate for Discharge   Problem: Skin Integrity: Goal: Risk for impaired skin integrity will decrease Outcome: Adequate for Discharge   Problem: Increased Nutrient Needs (NI-5.1) Goal: Food and/or nutrient delivery Description: Individualized approach for food/nutrient provision. Outcome: Adequate for Discharge   Problem: Acute Rehab OT Goals (only OT should resolve) Goal: Pt. Will Perform Grooming Outcome: Adequate for Discharge Goal: Pt. Will Perform Upper Body Dressing Outcome: Adequate for Discharge Goal: Pt. Will Perform Lower Body Dressing Outcome: Adequate for Discharge Goal: Pt. Will  Transfer To Toilet Outcome: Adequate for Discharge Goal: OT Additional ADL Goal #1 Outcome: Adequate for Discharge

## 2023-05-21 NOTE — Discharge Summary (Addendum)
Advanced Heart Failure Team  Discharge Summary   Patient ID: Joe [name] MRN: [id number], DOB/AGE: 20-Jul-1970 52 y.o. Admit date: 04/30/2023 D/C date:     05/21/2023   Primary Discharge Diagnoses:  Chronic systolic CHF HM III LVAD VT arrest CAD AKI DM II   Hospital Course:  Joe [name] is a 52 y.o. male with h/o morbid obesity, DM2, HTN, HL, OSA, CAD and chronic systolic HF.   Saw Dr. Mauri [name] in 3/19 due to CP and SOB. Cath EF 25-35% Severe diabetic CAD with LAD disease (m90 and m75),  RCA d50, LCA m50.    Echo 7/20 EF 20-25% moderate RV dysfunction  Had [employer] CRT-D placed 03/19/19 with Dr. Elberta [name].   Admitted 9/22 for CP. HsTn was 4. Cardiac cath showed patent stents in the LAD and LCX with diffuse distal LAD which may be a trigger for angina as well as mildly elevated RA pressure with normal PCWP and LVEDP w/ preserved cardiac output.    Echo 9/22 EF 35-40%  Struggled with volume overload over the last few months requiring frequent escalation in diuretics. Echo 08/24 with EF 20-25%. Close follow up 9/24, down 33 lbs but still with volume overload.   [facility] 9/24 showed stable 3v CAD, severe biventricular failure with elevated filling pressures and very low PAPi suggestive of poor RV function.    Admitted 10/10 - 04/01/23 for ICD shock in setting of VF. Loaded on IV amiodarone.    Seen at [facility] 04/02/23 for transplant evaluation. Currently not a candidate for transplant d/t BMI. LVAD as bridge to transplant recommended.    Readmitted for VT treated w/ ICD shock. He apparently briefly lost consciousness and had brief chest compressions by his brother-in law. Regained consciousness after his ICD fired twice. K 2.5 on admit.  Weight up 15 lbs above baseline.  Lytes repleted, diuresed and GDMT titrated.    He was admitted on 04/30/23 for LVAD placement. Started on milrinone to optimize pre-op d/t marginal RV on RHC. Underwent HM III VAD by Dr. Maren [name] on 05/03/23.  Inotropes/pressors slowly weaned off and he was diuresed.He then struggled with RV failure and recurrent volume overloaded. Milrinone added back, he was diuresed, then milrinone was successfully titrated off prior to discharge. Had several RAMP echoes throughout his course, speed increased to 6200 on 05/18/23 with improvement in flows. He was felt to be stable for discharge on 05/21/23.   Home health and close follow-up in VAD clinic arranged.   See below for hospital course by problem.   Hospital Course by Problem: Chronic Systolic HF s/p HM3 Implantation 11/14 - due to iCM - Echo 05/01/23: EF 20%, RV moderately down (improved) - RHC (9/24): elevated biventricular filling pressures with low PAPi; CI 2.2 - S/p HM III LVAD 05/03/23 by Dr. Maren [name] - Intra-op TEE - EF 20-25%, RV moderately reduced, IV septum with mild rightward bowing, AV opens intermittently - Limited Echo 11/18: LV decompressed, RV appeared dilated but difficult windows.  - RAMP echo 11/21: Speeds left at 6000.  Speed increased to 6100 on 11/27. Speed increased to 6200 on 05/18/23.  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  - Mixed venous 60% today; off milrinone.  - Volume improved; CVP 8-11; Give IV lasix 80mg  this AM. Transition to Torsemide 60 mg BID this afternoon - Continue Spiro 25 mg daily - INR 1.9  Warfarin dosing at discharge discussed with PharmD. - ASA at 81.    2. H/o VT Arrest  Hx PVCs -  had VT arrest x 2 in 10/24 - Continue Amio 200 mg bid and Mexiletine 250 mg bid   3. CAD with chronic stable angina - s/p multiple stents to LAD and LCX - LHC (9/24) with stable 3v CAD - Continue ASA + Crestor 40mg  daily   4. AKI  - Resolved   5. DM2 - A1c 7.8 (10/24) - Resume home ozempic at discharge.   6. OSA - Not using CPAP, says machine was recalled a while ago. Needs new machine. - Followed outpatient   LVAD Interrogation HM II:   Speed: 6200  Flow: 5.9      PI: 3.4     Power: 5.2       Back-up speed: 5900      Discharge Vitals: Blood pressure 106/86, pulse 73, temperature 97.8 F (36.6 C), temperature source Oral, resp. rate 20, height 6\' 3"  (1.905 m), weight (!) 138.6 kg, SpO2 99%.  Labs: Lab Results  Component Value Date   WBC 7.5 05/21/2023   HGB 9.5 (L) 05/21/2023   HCT 31.2 (L) 05/21/2023   MCV 98.7 05/21/2023   PLT 284 05/21/2023    Recent Labs  Lab 05/21/23 0540  NA 135  K 4.1  CL 100  CO2 29  BUN 17  CREATININE 1.19  CALCIUM 8.3*  GLUCOSE 124*   Lab Results  Component Value Date   CHOL 172 11/28/2021   HDL 27 (L) 11/28/2021   LDLCALC 96 11/28/2021   TRIG 244 (H) 11/28/2021   BNP (last 3 results) Recent Labs    05/04/23 0309 05/10/23 0025 05/17/23 0400  BNP 226.6* 196.1* 130.8*    ProBNP (last 3 results) Recent Labs    08/10/22 1534  PROBNP 351*     Diagnostic Studies/Procedures  RHC 04/30/23: Findings:  RA = 9 RV = 42/12 PA = 38/16 (28) PCW = 19 (v = 26) Fick cardiac output/index = 4.9/1.9 Thermo CO/CI = 5.7/2.2 PVR = 1.6 WU Ao sat = 94% PA sat = 57%, 62% PAPi = 2.4   Assessment: 1. Much improved hemodynamics   Plan/Discussion:   Will start low-dose milrinone for RV support in anticipation of VAD on Thursday.  Limited echo 05/14/23: IMPRESSIONS   1. LVAD ramp echo. LV EF < 20%. Speed initially 6000 rpm, increased to  6200 rpm then decreased to 6100 rpm. The aortic valve did not appear to  open at 6000 rpm. Difficult to comment on images as quite poor.   Discharge Medications   Allergies as of 05/21/2023       Reactions   Tramadol Other (See Comments)   Hallucinations        Medication List     STOP taking these medications    empagliflozin 10 MG Tabs tablet Commonly known as: Jardiance   Entresto 24-26 MG Generic drug: sacubitril-valsartan   magnesium oxide 400 (240 Mg) MG tablet Commonly known as: MAG-OX   nitroGLYCERIN 0.4 MG SL tablet Commonly known as: NITROSTAT   ranolazine 500 MG 12  hr tablet Commonly known as: RANEXA   triamcinolone cream 0.1 % Commonly known as: KENALOG   Vicks VapoInhaler Inha       TAKE these medications    acetaminophen 500 MG tablet Commonly known as: TYLENOL Take 1,000 mg by mouth every 6 (six) hours as needed for moderate pain or headache.   allopurinol 100 MG tablet Commonly known as: ZYLOPRIM Take 100 mg by mouth daily.   amiodarone 200 MG tablet Commonly known as: PACERONE Take  1 tablet (200 mg total) by mouth 2 (two) times daily. What changed: See the new instructions.   aspirin EC 81 MG tablet Take 1 tablet (81 mg total) by mouth daily for 12 days.   BLINK TEARS OP Place 2 drops into both eyes 4 (four) times daily as needed (for dry eyes).   colchicine 0.6 MG tablet Take 0.6 mg by mouth daily as needed (Gout).   diclofenac Sodium 1 % Gel Commonly known as: VOLTAREN APPLY 1 G TOPICALLY DAILY. What changed: See the new instructions.   digoxin 0.125 MG tablet Commonly known as: LANOXIN Take 1 tablet (0.125 mg total) by mouth daily.   escitalopram 10 MG tablet Commonly known as: LEXAPRO Take 10 mg by mouth at bedtime.   ezetimibe 10 MG tablet Commonly known as: ZETIA Take 1 tablet (10 mg total) by mouth daily.   gabapentin 300 MG capsule Commonly known as: NEURONTIN Take 300 mg by mouth 3 (three) times daily.   levothyroxine 25 MCG tablet Commonly known as: SYNTHROID Take 25 mcg by mouth daily.   mexiletine 250 MG capsule Commonly known as: MEXITIL Take 1 capsule (250 mg total) by mouth 2 (two) times daily.   omeprazole 40 MG capsule Commonly known as: PRILOSEC Take 1 capsule (40 mg total) by mouth daily.   Ozempic (1 MG/DOSE) 4 MG/3ML Sopn Generic drug: Semaglutide (1 MG/DOSE) Inject 1 mg into the skin every Monday.   potassium chloride SA 20 MEQ tablet Commonly known as: KLOR-CON M Take 3 tablets (60 mEq total) by mouth 2 (two) times daily. What changed:  how much to take when to take this    rosuvastatin 40 MG tablet Commonly known as: CRESTOR Take 1 tablet (40 mg total) by mouth daily. What changed: when to take this   sildenafil 20 MG tablet Commonly known as: REVATIO Take 1 tablet (20 mg total) by mouth 3 (three) times daily.   spironolactone 25 MG tablet Commonly known as: ALDACTONE Take 1 tablet (25 mg total) by mouth daily.   torsemide 20 MG tablet Commonly known as: DEMADEX Take 3 tablets (60 mg total) by mouth 2 (two) times daily.   Trigels-F Forte Caps capsule Generic drug: Fe Fum-Vit C-Vit B12-FA Take 1 capsule by mouth daily after breakfast.   warfarin 5 MG tablet Commonly known as: COUMADIN Take 1.5 tablets (7.5 mg total) by mouth daily.               Durable Medical Equipment  (From admission, onward)           Start     Ordered   05/18/23 1316  Heart failure home health orders  (Heart failure home health orders / Face to face)  Once       Comments: Heart Failure Follow-up Care:  Verify follow-up appointments per Patient Discharge Instructions. Confirm transportation arranged. Reconcile home medications with discharge medication list. Remove discontinued medications from use. Assist patient/caregiver to manage medications using pill box. Reinforce low sodium food selection Assessments: Vital signs and oxygen saturation at each visit. Assess home environment for safety concerns, caregiver support and availability of low-sodium foods. Consult Child psychotherapist, PT/OT, Dietitian, and CNA based on assessments. Perform comprehensive cardiopulmonary assessment. Notify MD for any change in condition or weight gain of 3 pounds in one day or 5 pounds in one week with symptoms. Daily Weights and Symptom Monitoring: Ensure patient has access to scales. Teach patient/caregiver to weigh daily before breakfast and after voiding using same scale and  record.    Teach patient/caregiver to track weight and symptoms and when to notify  Provider. Activity: Develop individualized activity plan with patient/caregiver.   Question Answer Comment  Heart Failure Follow-up Care Advanced Heart Failure (AHF) Clinic at [phone]   Obtain the following labs Basic Metabolic Panel   Lab frequency Other see comments   Fax lab results to AHF Clinic at [phone]   Diet Low Sodium Heart Healthy   Fluid restrictions: 1800 mL Fluid      05/18/23 1316   05/14/23 1255  For home use only DME Bedside commode  Once       Comments: Bariatric 3n1 bedside commode Weight 141 kg  Question Answer Comment  Patient needs a bedside commode to treat with the following condition LVAD (left ventricular assist device) present [facility])   Patient needs a bedside commode to treat with the following condition Heart failure [facility])   Patient needs a bedside commode to treat with the following condition Physical deconditioning      05/14/23 1256            Disposition   The patient will be discharged in stable condition to home. Discharge Instructions     (HEART FAILURE PATIENTS) Call MD:  Anytime you have any of the following symptoms: 1) 3 pound weight gain in 24 hours or 5 pounds in 1 week 2) shortness of breath, with or without a dry hacking cough 3) swelling in the hands, feet or stomach 4) if you have to sleep on extra pillows at night in order to breathe.   Complete by: As directed    AMB Referral to Advanced Lipid Disorders Clinic   Complete by: As directed    PTA rosuvastatin 40, added ezetimibe during admission. PMH of CAD s/p 4 stents   Reason for referral: Patients refractory to standard guideline based therapy   Internal Lipid Clinic Referral Scheduling  Internal lipid clinic referrals are providers within [facility], who wish to refer established patients for routine management (help in starting PCSK9 inhibitor therapy) or advanced therapies.  Internal MD referral criteria:              1. All patients with LDL>190 mg/dL  2. All  patients with Triglycerides >500 mg/dL  3. Patients with suspected or confirmed heterozygous familial hyperlipidemia (HeFH) or homozygous familial hyperlipidemia (HoFH)  4. Patients with family history of suspicious for genetic dyslipidemia desiring genetic testing  5. Patients refractory to standard guideline based therapy  6. Patients with statin intolerance (failed 2 statins, one of which must be a high potency statin)  7. Patients who the provider desires to be seen by MD   Internal PharmD referral criteria:   1. Follow-up patients for medication management  2. Follow-up for compliance monitoring  3. Patients for drug education  4. Patients with statin intolerance  5. PCSK9 inhibitor education and prior authorization approvals  6. Patients with triglycerides <500 mg/dL  External Lipid Clinic Referral  External lipid clinic referrals are for providers outside of [facility] considered new clinic patients - automatically routed to MD schedule   Amb Referral to Cardiac Rehabilitation   Complete by: As directed    VAD   Diagnosis: Other   After initial evaluation and assessments completed: Virtual Based Care may be provided alone or in conjunction with Phase 2 Cardiac Rehab based on patient barriers.: Yes   Intensive Cardiac Rehabilitation (ICR) [facility] location only OR Traditional Cardiac Rehabilitation (TCR) *If criteria for ICR are not met  will enroll in TCR [facility] only): Yes   Ambulatory referral to Physical Therapy   Complete by: As directed    Diet - low sodium heart healthy   Complete by: As directed    Increase activity slowly   Complete by: As directed        Follow-up Information     [facility] Follow up.   Specialty: Rehabilitation Why: Call to schedule apt for physical thearpy Contact information: [address] [place] [address] [phone]        Lonie Peak, PA-C. Go in 17  day(s).   Specialty: Physician Assistant Why: Hospital follow up appointment scheduled for Thursday, June 07, 2023 at 10:30 AM. PLEASE ARRIVE 10-15 minutes early.  PLEASE call to reschedule/cancel appointment if you CANNOT make it. Contact information: [address] [place] [address] [phone]         [facility] Follow up on 05/30/2023.   Specialty: Cardiology Why: VAD clinic 1 pm Contact information: [address] [place] [address] [phone]                  Duration of Discharge Encounter: Greater than 35 minutes   Signed, [facility], Calea Hribar N  05/21/2023, 1:20 PM

## 2023-05-21 NOTE — Progress Notes (Signed)
Occupational Therapy Treatment Patient Details Name: Joe [name] MRN: [id number] DOB: 1970/08/30 Today's Date: 05/21/2023   History of present illness 52 yo male admitted 04/30/23 for optimization with Heartmate III LVAD placed 11/14.  PMH: morbid obesity, DM2, HTN, HLD, OSA, CAD and systolic HF (EF 20-25%)   OT comments  Pt progressing well toward established OT goals. Pt with discharge orders and plan to go home today. Initiated session to optimize education and carryover of precautions during ADL in the home setting. Pt with good recall of precautions and compensatory techniques for ADLs of UB dressing, LB dressing, bed mobility, and toileting. Pt has been switching his LVAD to batteries without any issues. Wife will be present to assist at home as needed.      If plan is discharge home, recommend the following:  A little help with walking and/or transfers;A lot of help with bathing/dressing/bathroom;Assistance with cooking/housework;Assist for transportation;Help with stairs or ramp for entrance   Equipment Recommendations  BSC/3in1 (bariatric)    Recommendations for Other Services      Precautions / Restrictions Precautions Precautions: Sternal;Other (comment) Precaution Booklet Issued: Yes (comment) Precaution Comments: LVAD Restrictions Other Position/Activity Restrictions: sternal precautions       Mobility Bed Mobility Overal bed mobility: Modified Independent                  Transfers Overall transfer level: Modified independent                       Balance                                           ADL either performed or assessed with clinical judgement   ADL Overall ADL's : Needs assistance/impaired                 Upper Body Dressing : Minimal assistance;Sitting Upper Body Dressing Details (indicate cue type and reason): reviewed compensatory techniques     Toilet Transfer:  Stand-pivot;Supervision/safety   Toileting- Clothing Manipulation and Hygiene: Contact guard assist Toileting - Clothing Manipulation Details (indicate cue type and reason): reviewed compensatory techniques. Encouraged self pacing during posterior pericare and allowing wife to assist as needed       General ADL Comments: focus session on education regarding energy conservation and review of compensatory techniques for return to ADL within sternal precautions. Pt able to verbalize method for donning shirt, LB ADL, toileting, and bed mobility within precautions. pt wife to assist with placing battery packs in harness. Educated that shirts for controller and batteries also available that may be easier to don than harness.    Extremity/Trunk Assessment Upper Extremity Assessment Upper Extremity Assessment: Generalized weakness   Lower Extremity Assessment Lower Extremity Assessment: Defer to PT evaluation        Vision   Vision Assessment?: No apparent visual deficits   Perception Perception Perception: Not tested   Praxis Praxis Praxis: Not tested    Cognition Arousal: Alert Behavior During Therapy: [facility] for tasks assessed/performed Overall Cognitive Status: Within Functional Limits for tasks assessed                                 General Comments: pt able to identify compensatory techniques to maintain sternal precautions during ADL  Exercises      Shoulder Instructions       General Comments focus of session on education    Pertinent Vitals/ Pain       Pain Assessment Pain Assessment: No/denies pain  Home Living                                          Prior Functioning/Environment              Frequency  Min 1X/week        Progress Toward Goals  OT Goals(current goals can now be found in the care plan section)  Progress towards OT goals: Progressing toward goals  Acute Rehab OT Goals Patient Stated Goal:  home today OT Goal Formulation: With patient Time For Goal Achievement: 05/22/23 Potential to Achieve Goals: Good ADL Goals Pt Will Perform Grooming: with supervision;standing Pt Will Perform Upper Body Dressing: with set-up;sitting Pt Will Perform Lower Body Dressing: with supervision;sit to/from stand Pt Will Transfer to Toilet: with contact guard assist;ambulating Additional ADL Goal #1: Pt will safely manage LVAD (switch to batteries and manage VAD bag) during ADL without cues  Plan      Co-evaluation                 AM-PAC OT "6 Clicks" Daily Activity     Outcome Measure   Help from another person eating meals?: None Help from another person taking care of personal grooming?: A Little Help from another person toileting, which includes using toliet, bedpan, or urinal?: A Little Help from another person bathing (including washing, rinsing, drying)?: A Lot Help from another person to put on and taking off regular upper body clothing?: A Little Help from another person to put on and taking off regular lower body clothing?: Total 6 Click Score: 16    End of Session    OT Visit Diagnosis: Unsteadiness on feet (R26.81);Muscle weakness (generalized) (M62.81)   Activity Tolerance Patient tolerated treatment well   Patient Left in bed;with call bell/phone within reach;with nursing/sitter in room (IV team in room)   Nurse Communication Mobility status        Time: 3086-5784 OT Time Calculation (min): 17 min  Charges: OT General Charges $OT Visit: 1 Visit OT Treatments $Self Care/Home Management : 8-22 mins  Tyler Deis, OTR/L [facility] Acute Rehabilitation Office: [phone]   Joe [name] 05/21/2023, 10:01 AM

## 2023-05-21 NOTE — Progress Notes (Signed)
18 Days Post-Op Procedure(s) (LRB): INSERTION OF IMPLANTABLE LEFT VENTRICULAR ASSIST DEVICE - HEARTMATE 3 (N/A) TRANSESOPHAGEAL ECHOCARDIOGRAM (N/A) Subjective: Patient examined, images of last 2D echocardiogram performed 1 week ago personally reviewed.  Patient continues to do well at current speed 6200 RPM.  His coox this a.m. off milrinone was 60%. His mean arterial blood pressure is 80-90 mmHg.  He feels well.  Surgical incisions are all healing well.  He and his wife have worked with the VAD coordinators for VAD use at home.  I discussed the discharge instructions regarding lifting, wound care, and driving.  We will leave his chest tube sutures in place until the first clinic visit.  The patient's INR is stable and therapeutic.  Renal function is baseline with creatinine 1.1. Objective: Vital signs in last 24 hours: Temp:  [97.8 F (36.6 C)-98.7 F (37.1 C)] 97.8 F (36.6 C) (12/02 0755) Cardiac Rhythm: Ventricular paced (12/02 0701) Resp:  [19-20] 20 (12/02 0755) BP: (89-106)/(58-86) 106/86 (12/02 0755) SpO2:  [96 %-99 %] 99 % (12/02 0755) Weight:  [138.6 kg] 138.6 kg (12/02 0539)  Hemodynamic parameters for last 24 hours: CVP:  [8 mmHg-11 mmHg] 8 mmHg  Intake/Output from previous day: 12/01 0701 - 12/02 0700 In: 1263.4 [P.O.:1200; I.V.:63.4] Out: 3725 [Urine:3725] Intake/Output this shift: No intake/output data recorded.       Exam    General- alert and comfortable.  Sternal incision well-healed.    Neck- no JVD, no cervical adenopathy palpable, no carotid bruit   Lungs- clear without rales, wheezes   Cor- regular rate and rhythm, normal VAD hum   Abdomen- soft, non-tender   Extremities - warm, non-tender, minimal edema   Neuro- oriented, appropriate, no focal weakness   Lab Results: Recent Labs    05/20/23 0555 05/21/23 0540  WBC 7.8 7.5  HGB 10.0* 9.5*  HCT 32.3* 31.2*  PLT 355 284   BMET:  Recent Labs    05/20/23 0555 05/21/23 0540  NA 134* 135  K  4.1 4.1  CL 99 100  CO2 26 29  GLUCOSE 116* 124*  BUN 16 17  CREATININE 0.87 1.19  CALCIUM 8.5* 8.3*    PT/INR:  Recent Labs    05/21/23 0540  LABPROT 21.9*  INR 1.9*   ABG    Component Value Date/Time   PHART 7.381 05/04/2023 1654   HCO3 21.2 05/04/2023 1654   TCO2 22 05/04/2023 1654   ACIDBASEDEF 3.0 (H) 05/04/2023 1654   O2SAT 60.2 05/21/2023 0540   CBG (last 3)  Recent Labs    05/20/23 1619 05/20/23 2134 05/21/23 0619  GLUCAP 134* 196* 133*    Assessment/Plan: S/P Procedure(s) (LRB): INSERTION OF IMPLANTABLE LEFT VENTRICULAR ASSIST DEVICE - HEARTMATE 3 (N/A) TRANSESOPHAGEAL ECHOCARDIOGRAM (N/A) Patient doing well 2 and half weeks postop implantation of HeartMate 3.  The patient understands the importance of not allowing the controller to fall or pull on the power cord ever.   LOS: 21 days    Joe [name] 05/21/2023

## 2023-05-21 NOTE — Progress Notes (Addendum)
Patient ID: Joe [name], male   DOB: 06-17-1971, 52 y.o.   MRN: [id number]   Advanced Heart Failure VAD Team Note  PCP-Cardiologist: [name], MD   Subjective:    11/14: s/p HM3 LVAD implantation.   - CVP 8-11 this AM; mixed venous 60%. Diuresed 3.7L yesterday. Back to pre-op/admission weight.  - Feels good ready to go home. Denies CP/SOB.   LVAD Interrogation HM3: Speed: 6200    Flow: 5.9   PI: 3.4    Power: 5.2  No PI events today so far  Objective:    Vital Signs:   Temp:  [97.9 F (36.6 C)-98.7 F (37.1 C)] 97.9 F (36.6 C) (12/02 0539) Resp:  [19-20] 20 (12/02 0539) BP: (89-104)/(58-89) 99/77 (12/02 0539) SpO2:  [95 %-99 %] 99 % (12/02 0539) Weight:  [138.6 kg] 138.6 kg (12/02 0539) Last BM Date : 05/20/23  Mean arterial Pressure 80s  Intake/Output:   Intake/Output Summary (Last 24 hours) at 05/21/2023 0708 Last data filed at 05/21/2023 0555 Gross per 24 hour  Intake 903.35 ml  Output 3725 ml  Net -2821.65 ml   Physical Exam    CVP 8-11 (sitting up) General:  Well appearing. No resp difficulty HEENT: Normal Neck: supple. JVP difficult to see. Carotids 2+ bilat; no bruits. No lymphadenopathy or thyromegaly appreciated. Cor: Mechanical heart sounds with LVAD hum present. Lungs: Clear. Diminished LLL Abdomen: soft, nontender, nondistended. No hepatosplenomegaly. No bruits or masses. Good bowel sounds. Driveline: C/D/I; securement device intact and driveline incorporated Extremities: no cyanosis, clubbing, rash, +1 edema LLE. + UNNA boots. PICC RUE Neuro: alert & orientedx3, cranial nerves grossly intact. moves all 4 extremities w/o difficulty. Affect pleasant   Telemetry   AV paced 80s (personally reviewed)  Labs   Basic Metabolic Panel: Recent Labs  Lab 05/17/23 0400 05/18/23 0355 05/19/23 0547 05/20/23 0555 05/21/23 0540  NA 133* 133* 135 134* 135  K 3.9 3.7 4.0 4.1 4.1  CL 99 99 101 99 100  CO2 25 26 26 26 29   GLUCOSE 163* 230*  125* 116* 124*  BUN 12 14 15 16 17   CREATININE 0.97 1.14 0.92 0.87 1.19  CALCIUM 8.1* 7.8* 7.9* 8.5* 8.3*  MG 2.4  --  2.2  --   --     CBC: Recent Labs  Lab 05/17/23 0400 05/18/23 0355 05/19/23 0547 05/20/23 0555 05/21/23 0540  WBC 9.6 8.0 8.3 7.8 7.5  HGB 8.9* 9.0* 9.1* 10.0* 9.5*  HCT 28.3* 29.3* 29.8* 32.3* 31.2*  MCV 99.0 100.3* 98.7 97.9 98.7  PLT 371 357 352 355 284    INR: Recent Labs  Lab 05/17/23 0400 05/18/23 0355 05/19/23 0547 05/20/23 0555 05/21/23 0540  INR 1.6* 1.7* 1.7* 1.8* 1.9*    Other results:   Imaging   No results found.  Medications:    Scheduled Medications:  allopurinol  100 mg Oral Daily   amiodarone  200 mg Oral BID   aspirin EC  81 mg Oral Daily   Chlorhexidine Gluconate Cloth  6 each Topical Daily   digoxin  0.125 mg Oral Daily   escitalopram  10 mg Oral QHS   ezetimibe  10 mg Oral Daily   Fe Fum-Vit C-Vit B12-FA  1 capsule Oral QPC breakfast   gabapentin  400 mg Oral TID   Gerhardt's butt cream   Topical BID   insulin aspart  0-24 Units Subcutaneous TID WC & HS   insulin detemir  20 Units Subcutaneous BID   levothyroxine  25 mcg Oral Daily   mexiletine  250 mg Oral Q12H   rosuvastatin  40 mg Oral Daily   sildenafil  20 mg Oral TID   sodium chloride flush  10-40 mL Intracatheter Q12H   sodium chloride flush  3 mL Intravenous Q12H   spironolactone  25 mg Oral Daily   Warfarin - Pharmacist Dosing Inpatient   Does not apply q1600    Infusions:     PRN Medications: acetaminophen, antiseptic oral rinse, dextrose, ondansetron (ZOFRAN) IV, mouth rinse, oxyCODONE, polyvinyl alcohol, sodium chloride flush  Patient Profile   Joe [name] is a 52 y.o. male with h/o morbid obesity, DM2, HTN, HL, OSA, CAD and systolic HF.  Now s/p HM3 LVAD Implantation 11/14 by Dr. Donata Clay.  Assessment/Plan:   1.  Chronic Systolic HF s/p HM3 Implantation 11/14 - due to iCM - Echo 05/01/23: EF 20%, RV moderately down (improved) - RHC  (9/24): elevated biventricular filling pressures with low PAPi; CI 2.2 - S/p HM III LVAD 05/03/23 by Dr. Maren Beach - Intra-op TEE - EF 20-25%, RV moderately reduced, IV septum with mild rightward bowing, AV opens intermittently - Limited Echo 11/18: LV decompressed, RV appeared dilated but difficult windows.  - RAMP echo 11/21: Speeds left at 6000.  Speed increased to 6100 on 11/27. Speed increased to 6200 on 05/18/23.  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  - Mixed venous 60% today; off milrinone.  - Volume improving; CVP 8-11; IV lasix 80mg  this AM. Transition to Torsemide 60 mg BID this afternoon - Continue Spiro to 25 mg  - INR 1.9  Warfarin d/w PharmD  - ASA at 81.   2. H/o VT Arrest  Hx PVCs - had VT arrest x 2 in 10/24 - Continue Amio 200 mg bid and Mexiletine 250 mg bid   3. CAD with chronic stable angina - s/p multiple stents to LAD and LCX - LHC (9/24) with stable 3v CAD - Continue ASA + Crestor 40mg  daily  4. AKI  - Resolved   5. DM2 - A1c 7.2 (1/24) - SSI   6. OSA - Not using CPAP, says machine was recalled a while ago. Needs new machine. - Followed outpatient   7. Hypokalemia - Resolved  May be stable for discharge later today. HH RN/PT arranged. Will get post hospital follow up. Awaiting MD eval. Will send meds to [facility] pharmacy at d/c.   Length of Stay: 21  Alen Bleacher, NP 05/21/2023, 7:08 AM  VAD Team --- VAD ISSUES ONLY--- Pager [phone] (7am - 7am)  Advanced Heart Failure Team  Pager [phone] (M-F; 7a - 5p)  Please contact [facility] for night-coverage after hours (5p -7a ) and weekends on [email or web address]  Patient seen with NP, agree with the above note.   He is doing well today.  CVP 8-9 cm.  I/Os negative, weight down 2 lbs.  Creatinine stable.  Co-ox 60% off milrinone. INR 1.9 on warfarin.  Walking hall without dyspnea.   General: Well appearing this am. NAD.  HEENT: Normal. Neck: Supple, JVP 8-9 cm. Carotids OK.  Cardiac:   Mechanical heart sounds with LVAD hum present.  Lungs:  CTAB, normal effort.  Abdomen:  NT, ND, no HSM. No bruits or masses. +BS  LVAD exit site: Well-healed and incorporated. Dressing dry and intact. No erythema or drainage. Stabilization device present and accurately applied. Driveline dressing changed daily per sterile technique. Extremities:  Warm and dry. No cyanosis, clubbing, rash, or edema.  Neuro:  Alert & oriented x 3. Cranial nerves grossly intact. Moves all 4 extremities w/o difficulty. Affect pleasant    He has diuresed well, weight down to pre-op.  Give Lasix 80 mg IV this morning, then start torsemide 60 mg bid this afternoon.   MAP stable on current regimen, LVAD parameters stable.   I think he can go home later today with close followup in LVAD clinic.  Will arrange.   Marca Ancona 05/21/2023 9:57 AM

## 2023-05-21 NOTE — Progress Notes (Signed)
PICC Removal Note: PICC line removed from RUE. PICC catheter tip visualized and intact. Pressure held until hemostasis achieved. Pressure dressing applied. No redness, edema, swelling, or drainage noted at site. Instructions provided on post PICC discharge care, including followup notification instructions.  Bedrest until 1045.

## 2023-05-21 NOTE — TOC Transition Note (Signed)
Transition of Care [facility]) - CM/SW Discharge Note   Patient Details  Name: Joe [name] MRN: [id number] Date of Birth: 1970/07/02  Transition of Care [facility]) CM/SW Contact:  Nicanor Bake Phone Number: [phone] 05/21/2023, 11:09 AM   Clinical Narrative:  HF CSW met with pt at bedside. Pts wife will be providing transportation home. CSW informed pt about upcoming appointments.      Final next level of care: Home/Self Care Barriers to Discharge: No Barriers Identified   Patient Goals and CMS Choice CMS Medicare.gov Compare Post Acute Care list provided to:: Patient Represenative (must comment) Choice offered to / list presented to : Parent  Discharge Placement                         Discharge Plan and Services Additional resources added to the After Visit Summary for     Discharge Planning Services: CM Consult Post Acute Care Choice: IP Rehab                      [facility] Agency: [facility] Date [facility] Agency Contacted: 05/14/23 Time HH Agency Contacted: 1257 Representative spoke with at [facility] Agency: Adele Dan  Social Determinants of Health (SDOH) Interventions SDOH Screenings   Food Insecurity: No Food Insecurity (04/30/2023)  Housing: Low Risk  (04/30/2023)  Transportation Needs: No Transportation Needs (04/30/2023)  Utilities: Not At Risk (04/30/2023)  Financial Resource Strain: Low Risk  (04/20/2023)  Social Connections: Unknown (10/28/2021)   Received from [facility]  Tobacco Use: Low Risk  (05/03/2023)     Readmission Risk Interventions     No data to display

## 2023-05-21 NOTE — Plan of Care (Signed)
  Problem: Cardiovascular: Goal: Ability to achieve and maintain adequate cardiovascular perfusion will improve Outcome: Progressing Goal: Vascular access site(s) Level 0-1 will be maintained Outcome: Progressing   Problem: Clinical Measurements: Goal: Ability to maintain clinical measurements within normal limits will improve Outcome: Progressing Goal: Will remain free from infection Outcome: Progressing Goal: Diagnostic test results will improve Outcome: Progressing Goal: Respiratory complications will improve Outcome: Progressing Goal: Cardiovascular complication will be avoided Outcome: Progressing   Problem: Activity: Goal: Risk for activity intolerance will decrease Outcome: Progressing   Problem: Coping: Goal: Level of anxiety will decrease Outcome: Progressing   Problem: Elimination: Goal: Will not experience complications related to bowel motility Outcome: Progressing Goal: Will not experience complications related to urinary retention Outcome: Progressing   Problem: Pain Management: Goal: General experience of comfort will improve Outcome: Progressing   Problem: Safety: Goal: Ability to remain free from injury will improve Outcome: Progressing   Problem: Skin Integrity: Goal: Risk for impaired skin integrity will decrease Outcome: Progressing

## 2023-05-21 NOTE — Progress Notes (Addendum)
Discharge equipment includes:  1. Two system controllers. 2. Mobile Power Unit (MPU) with 20' patient cable 3. One universal Magazine features editor (UBC) 4. Eight fully charged batteries  5. Four battery clips 6. One travel case 7. One holster vest 8. 1 Consolidated bag  8. Wearable accessory package 9. Daily dressing kits, anchors, Aquacel (silver dressing)   VAD Education:    1. Reviewed importance of having a 24 hour caregiver 2. Reviewed dressing change frequency 3. Reviewed when to call the VAD pager and made sure they have phone number in their phone.  4. Reviewed importance of changing one power source at a time 5. Reviewed importance of carrying black emergency bag containing backup controller, 2 batteries, and 2 battery clips, everywhere 6. Reviewed importance of placing mobile power unit (MPU) and batteries on bedside table with a flashlight. Talked about what to do in case of power failure. Reminded to make sure the outlets that equipment is plugged into are not controlled by a light switch.  7. Reviewed importance of using anchors to hold drive line in place, to prevent accidental pulling, or dislodgement of drive line.  8. Patient and family agreed to pick up prescriptions. Stressed importance of taking Warfarin daily in the evening. Stressed importance of taking all prescribed medications as written 9. First clinic visit bring all medications and VAD log.    Simmie Davies RN, BSN VAD Coordinator 24/7 Pager [phone]

## 2023-05-21 NOTE — Progress Notes (Signed)
Patient provided with verbal discharge instructions. Paper copy of discharge provided to patient. LVAD equipment and black backup bag sent with patient. Wife at bedside during d/c. RN answered all questions. VSS at discharge. PICC removed. Patient belongings sent with patient. Patient dc'd via wheelchair to private vehicle.

## 2023-05-21 NOTE — Progress Notes (Signed)
PHARMACY - ANTICOAGULATION CONSULT NOTE  Pharmacy Consult for warfarin Indication: LVAD HM3 implanted 11/14  Allergies  Allergen Reactions   Tramadol Other (See Comments)    Hallucinations    Patient Measurements: Height: 6\' 3"  (190.5 cm) Weight: (!) 138.6 kg (305 lb 8.9 oz) IBW/kg (Calculated) : 84.5   Vital Signs: Temp: 97.8 F (36.6 C) (12/02 0755) Temp Source: Oral (12/02 0755) BP: 106/86 (12/02 0755)  Labs: Recent Labs    05/19/23 0547 05/20/23 0555 05/21/23 0540  HGB 9.1* 10.0* 9.5*  HCT 29.8* 32.3* 31.2*  PLT 352 355 284  LABPROT 20.4* 20.9* 21.9*  INR 1.7* 1.8* 1.9*  CREATININE 0.92 0.87 1.19    Estimated Creatinine Clearance: 109 mL/min (by C-G formula based on SCr of 1.19 mg/dL).   Medical History: Past Medical History:  Diagnosis Date   Abnormal EKG    Acute systolic heart failure (HCC) 09/06/2017   Angina pectoris (HCC) 09/05/2017   Body mass index 45.0-49.9, adult (HCC)    CAD S/P percutaneous coronary angioplasty 09/07/2017   mLAD and dLAD PCI with DES 09/06/17   CHF (congestive heart failure) (HCC)    Chronic systolic (congestive) heart failure (HCC) 03/19/2019   Complication of anesthesia 1995   "had hard time waking up; breathing w/vasectomy"   Coronary artery disease    Erectile dysfunction    Essential hypertension, benign    Fatigue    GERD (gastroesophageal reflux disease)    Gout    High cholesterol    "just started tx yesterday" (09/06/2017)   History of gout    "RX prn" (09/06/2017)   Ischemic cardiomyopathy 09/07/2017   EF 25-35%   Muscular chest pain    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   Below Left Nare (MOH's)   Obesity    Obstructive sleep apnea    OSA on CPAP    Plantar fasciitis    Pre-operative clearance 02/11/2018   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/02/2019   Saint Jude   Right bundle branch block    Shortness of breath 09/05/2017   Testosterone deficiency    Type 2  diabetes mellitus with complication, without long-term current use of insulin (HCC) 09/05/2017   Type II diabetes mellitus (HCC)    "started tx 08/28/2017"    Assessment: 52yom with heart failure s/p LVAD implant HM3 11/14. INR slightly elevated at baseline (1.6).   INR is slightly subtherapeutic at 1.9. Hgb 9.5, plt 284. LDH 190. No s/sx of bleeding. Eating 100% of documented meals. No new DDIs noted (continues on amiodarone 200 mg BID).   Goal of Therapy:  INR 2-2.5 Monitor platelets by anticoagulation protocol: Yes   Plan:  Warfarin 10 mg x1 tonight Will plan to discharge on warfarin 7.5 mg daily starting 12/3  Daily PT/INR Monitor s/s bleeding   Thank you for allowing pharmacy to participate in this patient's care,  Sherron Monday, PharmD, BCCCP Clinical Pharmacist  Phone: [phone] 05/21/2023 9:34 AM  Please check AMION for all [facility] phone numbers After 10:00 PM, call Main Pharmacy [phone]

## 2023-05-24 ENCOUNTER — Other Ambulatory Visit (HOSPITAL_COMMUNITY): Payer: Self-pay | Admitting: Internal Medicine

## 2023-05-24 ENCOUNTER — Other Ambulatory Visit (HOSPITAL_COMMUNITY): Payer: Self-pay

## 2023-05-24 ENCOUNTER — Telehealth (HOSPITAL_COMMUNITY): Payer: Self-pay

## 2023-05-24 NOTE — Telephone Encounter (Signed)
Called patient to see if he is interested in the Cardiac Rehab Program. Patient expressed interest. Explained scheduling process and went over insurance, patient verbalized understanding. Will contact patient for scheduling once f/u has been completed.  °

## 2023-05-24 NOTE — Telephone Encounter (Signed)
Pt insurance is active and benefits verified through [facility]. Co-pay $40.00, DED $0.00/$0.00 met, out of pocket $4,500.00/$4,500.00 met, co-insurance 0%. No pre-authorization required. [employer], 05/24/23 @ 12PM [id number]   How many CR sessions are covered? (36 visits for TCR, 72 visits for ICR)72 Is this a lifetime maximum or an annual maximum? Annual Has the member used any of these services to date? No Is there a time limit (weeks/months) on start of program and/or program completion? No     Will contact patient to see if he is interested in the Cardiac Rehab Program. If interested, patient will need to complete follow up appt. Once completed, patient will be contacted for scheduling upon review by the RN Navigator.

## 2023-05-25 ENCOUNTER — Telehealth (HOSPITAL_COMMUNITY): Payer: Self-pay | Admitting: Unknown Physician Specialty

## 2023-05-25 ENCOUNTER — Ambulatory Visit: Payer: 59 | Admitting: Cardiology

## 2023-05-25 NOTE — Telephone Encounter (Signed)
Pt called the office c/o of trickle nosebleed in both nostrils off and on over the past few days. Pt was given instruction on keeping his nares moistened with saline nasal sprays, humidifier and holding pressure for 30 minutes to stop bleeding. Pt tells me that it is not bleeding bad but just a trickle "every now and then."  Carlton Adam RN, BSN VAD Coordinator 24/7 Pager [phone]

## 2023-05-28 ENCOUNTER — Other Ambulatory Visit (HOSPITAL_COMMUNITY): Payer: Self-pay

## 2023-05-29 ENCOUNTER — Other Ambulatory Visit (HOSPITAL_COMMUNITY): Payer: Self-pay

## 2023-05-29 DIAGNOSIS — Z95811 Presence of heart assist device: Secondary | ICD-10-CM

## 2023-05-29 DIAGNOSIS — Z7901 Long term (current) use of anticoagulants: Secondary | ICD-10-CM

## 2023-05-30 ENCOUNTER — Other Ambulatory Visit (HOSPITAL_COMMUNITY): Payer: Self-pay

## 2023-05-30 ENCOUNTER — Encounter (HOSPITAL_COMMUNITY): Payer: Self-pay | Admitting: Internal Medicine

## 2023-05-30 ENCOUNTER — Ambulatory Visit (HOSPITAL_COMMUNITY)
Admit: 2023-05-30 | Discharge: 2023-05-30 | Disposition: A | Discharge status: Home / Self Care | Payer: 59 | Attending: Internal Medicine | Admitting: Internal Medicine

## 2023-05-30 ENCOUNTER — Other Ambulatory Visit (HOSPITAL_COMMUNITY): Payer: 59

## 2023-05-30 ENCOUNTER — Ambulatory Visit (HOSPITAL_COMMUNITY): Payer: Self-pay | Admitting: Pharmacist

## 2023-05-30 ENCOUNTER — Encounter (HOSPITAL_COMMUNITY): Payer: 59

## 2023-05-30 VITALS — BP 78/0 | HR 78 | Wt 307.4 lb

## 2023-05-30 DIAGNOSIS — Z95811 Presence of heart assist device: Secondary | ICD-10-CM | POA: Insufficient documentation

## 2023-05-30 DIAGNOSIS — G4733 Obstructive sleep apnea (adult) (pediatric): Secondary | ICD-10-CM

## 2023-05-30 DIAGNOSIS — Z7901 Long term (current) use of anticoagulants: Secondary | ICD-10-CM | POA: Diagnosis not present

## 2023-05-30 DIAGNOSIS — I5022 Chronic systolic (congestive) heart failure: Secondary | ICD-10-CM

## 2023-05-30 DIAGNOSIS — I2510 Atherosclerotic heart disease of native coronary artery without angina pectoris: Secondary | ICD-10-CM

## 2023-05-30 LAB — BASIC METABOLIC PANEL
Anion gap: 9 (ref 5–15)
BUN: 18 mg/dL (ref 6–20)
CO2: 31 mmol/L (ref 22–32)
Calcium: 9.1 mg/dL (ref 8.9–10.3)
Chloride: 95 mmol/L — ABNORMAL LOW (ref 98–111)
Creatinine, Ser: 1.28 mg/dL — ABNORMAL HIGH (ref 0.61–1.24)
GFR, Estimated: >60 mL/min (ref >60)
Glucose, Bld: 217 mg/dL — ABNORMAL HIGH (ref 70–99)
Potassium: 4 mmol/L (ref 3.5–5.1)
Sodium: 135 mmol/L (ref 135–145)

## 2023-05-30 LAB — PROTIME-INR
INR: 2.1 — ABNORMAL HIGH (ref 0.8–1.2)
Prothrombin Time: 23.9 s — ABNORMAL HIGH (ref 11.4–15.2)

## 2023-05-30 LAB — CBC
HCT: 37 % — ABNORMAL LOW (ref 39.0–52.0)
Hemoglobin: 11.8 g/dL — ABNORMAL LOW (ref 13.0–17.0)
MCH: 30.5 pg (ref 26.0–34.0)
MCHC: 31.9 g/dL (ref 30.0–36.0)
MCV: 95.6 fL (ref 80.0–100.0)
Platelets: 236 10*3/uL (ref 150–400)
RBC: 3.87 MIL/uL — ABNORMAL LOW (ref 4.22–5.81)
RDW: 15.8 % — ABNORMAL HIGH (ref 11.5–15.5)
WBC: 6.6 10*3/uL (ref 4.0–10.5)
nRBC: 0 % (ref 0.0–0.2)

## 2023-05-30 LAB — LACTATE DEHYDROGENASE: LDH: 210 U/L — ABNORMAL HIGH (ref 98–192)

## 2023-05-30 LAB — DIGOXIN LEVEL: Digoxin Level: 0.7 ng/mL — ABNORMAL LOW (ref 0.8–2.0)

## 2023-05-30 MED ORDER — TORSEMIDE 20 MG PO TABS: 60.0000 mg | ORAL_TABLET | Freq: Every day | ORAL | 3 refills | Status: DC

## 2023-05-30 NOTE — Progress Notes (Addendum)
Pt presents for hospital follow up in VAD Clinic today. Reports no problems with VAD equipment or concerns with drive line.  Pt states he as been doing well since discharge. He feels like he is adjusting well and has been increasing his activity day to day. Pt experiencing mild dizziness when standing up too quickly and some shortness of breath when bending over. He states he is able to lay flat without issue at night. Pt taking all medication as prescribed. Endorses good response to diuretics. Currently taking Torsemide 60mg  BID. Mild edema noted in lower extremities. Pt endorses good hydration he states he drinks approximately 2L of water daily. Discussed with Dr. Gala Romney. Pt having >100 PI events daily and noted his PI was on the lower end of his trend this morning (1.8). MD advised pt to reduced Torsemide dose to 60mg  daily and wear compression stockings daily.   Pt states he is having some recurrent pocket pain after picking up a jug of milk. Pt taking Gabapentin and Tylenol daily which he states has been helping with his pain control.  Pt inquiring about insurance coverage for Sildenafil and Trigels. Pharmacist advised for pt to use Goodrx for Sildenafil and switch to One a Day multivitamin as it does not have Vitamin K as replacement for Trigels since insurance does not cover Trigels.  Chest tube sutures removed at today's visit without complication.   Vital Signs:  Doppler Pressure: 78 Automatic BP: 120/89 (99) HR:  97 NSR SPO2: 96 %   Weight: 307.4 lb w/o eqt Last weight: 305 lb Home weights: 296-305 lbs BMI today 38.42 today   VAD Indication: Destination therapy- Seen by [facility] for transplant. Transplant team advised for VAD. Once pt lost 25lbs they would reconsider for transplant.   LVAD assessment: HM III : Primary Controller  Speed: 6200 rpms Flow: 6.5 Power: 5.4 w    PI: 1.9 Alarms: no clinical alarms  Events:>100 PI events  Fixed speed: 6200 Low speed limit:  5900  Primary controller: back up battery due for replacement in 29 months Secondary controller:  back up battery due for replacement in  29 months   I reviewed the LVAD parameters from today and compared the results to the patient's prior recorded data. LVAD interrogation was NEGATIVE for significant power changes, NEGATIVE for clinical alarms and STABLE for PI events/speed drops. No programming changes were made and pump is functioning within specified parameters. Pt is performing daily controller and system monitor self tests along with completing weekly and monthly maintenance for LVAD equipment.   LVAD equipment check completed and is in good working order. Back-up equipment present. Charged back up battery and performed self-test on equipment.    Annual Equipment Maintenance on UBC/PM was performed on 05/03/2023.   Education: Discussed with patient and wife  the need to notify the VAD coordinator of any change in VAD numbers from baseline, any alarms (other than low voltage) and any concerns he/she may have about the device, equipment or percutaneous lead. Confirmed that both patient and wife have emergency contact information. Patient if aware that there is someone available 24/7 to assist with any VAD issues. He/she has been instructed to call 911 for any life-threatening emergencies first then page/call VAD coordinator for assistance with the VAD.   Patient was instructed of importance of protecting percutaneous lead from trauma. Discussed importance of not allowing lead to become bent, twisted, pulled on, torn, or damaged in any way.  He/she is aware that the anchor should always  be worn to protect percutaneous lead as well as to prevent system controller from being dropped or falling. Educated patient that trauma to driveline insertion site is major cause of driveline infection and must be avoided at all times.  Patient was informed he MUST always have back-up system controller as well as  extra batteries with him at all times. This is considered an absolute requirement necessary for his safety and well-being.   Exit Site Care: Dressing change performed everyday by pt's wife Lenore at home. Existing VAD dressing removed and site care performed using sterile technique. Drive line exit site cleaned with Chlora prep applicators x 2, rinsed with sterile saline and allowed to dry. Silver strip applied flush with exit site. Exit site is unincorporated, the velour is fully implanted at exit site.Suture removed at exit site today. Scant drainage noted on previous silver strip. No redness, tenderness, foul odor or rash noted. Drive line anchor re-applied. Pt advised they may advance to M/W/F dressing changes with daily kit. Pt provided with 14 daily kits,14 anchors and sterile silver strips pre-cut by VAD Coordinator.   Significant Events on VAD Support:     Device: St Jude Therapies: ON VF 200 Last check: 04/16/23   BP & Labs:  MAP 78 - Doppler is reflecting MAP   Hgb 11.8 - No S/S of bleeding. Specifically denies melena/BRBPR or nosebleeds.   LDH stable at 210 with established baseline of 190- 245. Denies tea-colored urine. No power elevations noted on interrogation.    Plan: 1.Decrease Torsemide to 60mg  daily 2. Please wear compression stockings daily to help with leg swelling 3. Return to VAD Clinic in 1 week  Simmie Davies RN, BSN VAD Coordinator 24/7 Pager [phone]

## 2023-05-30 NOTE — Patient Instructions (Addendum)
1.Decrease Torsemide to 60mg  daily 2. Please wear compression stockings daily to help with leg swelling 3. Return to VAD Clinic in 1 week

## 2023-06-06 ENCOUNTER — Telehealth (HOSPITAL_COMMUNITY): Payer: Self-pay

## 2023-06-06 DIAGNOSIS — Z95811 Presence of heart assist device: Secondary | ICD-10-CM

## 2023-06-06 MED ORDER — TORSEMIDE 20 MG PO TABS: 60.0000 mg | ORAL_TABLET | Freq: Two times a day (BID) | ORAL | 3 refills | Status: DC

## 2023-06-06 NOTE — Telephone Encounter (Signed)
Pt called Joe [name] reporting weight gain of 2 lbs a day since Sunday. Weight Sunday 298.4lbs and today is 305.4lbs. Pt endorses some shortness of breath last night so he took an extra 20mg  Torsemide without relief. Torsemide reduced from 60mg  BID to 60mg  daily at [name] visit last week. Discussed with Joe [name] will increase Torsemide back to 60mg  BID till follow up on Friday and reassess diuretic plan at that time. Pt made aware and verbalizes understanding. Pt asked to bring red folder with weights and Joe parameters to this appointment.  Simmie Davies RN, BSN Joe Coordinator 24/7 Pager [phone]

## 2023-06-07 ENCOUNTER — Other Ambulatory Visit (HOSPITAL_COMMUNITY): Payer: Self-pay

## 2023-06-07 DIAGNOSIS — Z7901 Long term (current) use of anticoagulants: Secondary | ICD-10-CM

## 2023-06-07 DIAGNOSIS — Z95811 Presence of heart assist device: Secondary | ICD-10-CM

## 2023-06-08 ENCOUNTER — Ambulatory Visit (HOSPITAL_COMMUNITY)
Admission: RE | Admit: 2023-06-08 | Discharge: 2023-06-08 | Disposition: A | Discharge status: Home / Self Care | Payer: 59 | Source: Ambulatory Visit | Attending: Internal Medicine | Admitting: Internal Medicine

## 2023-06-08 ENCOUNTER — Ambulatory Visit (HOSPITAL_COMMUNITY): Payer: Self-pay | Admitting: Pharmacist

## 2023-06-08 VITALS — BP 106/0 | HR 69 | Ht 75.0 in | Wt 309.8 lb

## 2023-06-08 DIAGNOSIS — I25118 Atherosclerotic heart disease of native coronary artery with other forms of angina pectoris: Secondary | ICD-10-CM

## 2023-06-08 DIAGNOSIS — G4733 Obstructive sleep apnea (adult) (pediatric): Secondary | ICD-10-CM

## 2023-06-08 DIAGNOSIS — E118 Type 2 diabetes mellitus with unspecified complications: Secondary | ICD-10-CM

## 2023-06-08 DIAGNOSIS — I5022 Chronic systolic (congestive) heart failure: Secondary | ICD-10-CM

## 2023-06-08 DIAGNOSIS — Z95811 Presence of heart assist device: Secondary | ICD-10-CM | POA: Insufficient documentation

## 2023-06-08 DIAGNOSIS — Z7901 Long term (current) use of anticoagulants: Secondary | ICD-10-CM | POA: Insufficient documentation

## 2023-06-08 LAB — CBC
HCT: 38.4 % — ABNORMAL LOW (ref 39.0–52.0)
Hemoglobin: 12.3 g/dL — ABNORMAL LOW (ref 13.0–17.0)
MCH: 30.4 pg (ref 26.0–34.0)
MCHC: 32 g/dL (ref 30.0–36.0)
MCV: 95 fL (ref 80.0–100.0)
Platelets: 229 10*3/uL (ref 150–400)
RBC: 4.04 MIL/uL — ABNORMAL LOW (ref 4.22–5.81)
RDW: 15 % (ref 11.5–15.5)
WBC: 6.6 10*3/uL (ref 4.0–10.5)
nRBC: 0 % (ref 0.0–0.2)

## 2023-06-08 LAB — BASIC METABOLIC PANEL
Anion gap: 14 (ref 5–15)
BUN: 17 mg/dL (ref 6–20)
CO2: 28 mmol/L (ref 22–32)
Calcium: 8.7 mg/dL — ABNORMAL LOW (ref 8.9–10.3)
Chloride: 97 mmol/L — ABNORMAL LOW (ref 98–111)
Creatinine, Ser: 1.21 mg/dL (ref 0.61–1.24)
GFR, Estimated: >60 mL/min (ref >60)
Glucose, Bld: 270 mg/dL — ABNORMAL HIGH (ref 70–99)
Potassium: 3.6 mmol/L (ref 3.5–5.1)
Sodium: 139 mmol/L (ref 135–145)

## 2023-06-08 LAB — LACTATE DEHYDROGENASE: LDH: 195 U/L — ABNORMAL HIGH (ref 98–192)

## 2023-06-08 LAB — DIGOXIN LEVEL: Digoxin Level: 0.4 ng/mL — ABNORMAL LOW (ref 0.8–2.0)

## 2023-06-08 LAB — PROTIME-INR
INR: 3 — ABNORMAL HIGH (ref 0.8–1.2)
Prothrombin Time: 31.4 s — ABNORMAL HIGH (ref 11.4–15.2)

## 2023-06-08 NOTE — Patient Instructions (Addendum)
Titrate afternoon Torsemide based on swelling/dizzines 2. Please wear compression stockings daily to help with leg swelling 3. Advance dressing changes to Tuesday and Friday without the silver strip 4. Return to VAD Clinic in 1 month

## 2023-06-08 NOTE — Progress Notes (Signed)
Pt presents for 1 week follow up in VAD Clinic today. Reports no problems with VAD equipment or concerns with drive line.  Last week pts torsemide was decreased to 60 mg daily. Pt called on 12/18 to report an increase in his weight, so Torsemide was increased back to 60 mg bid. Pt tells me today that his dizzy since increasing the Torsemide. Pt has >100 pi for today. Pt taking all medication as prescribed.   Pt has brought papers for VAD coordinators to fill out regarding disability.   Vital Signs:  Doppler Pressure: 106 Automatic BP: 114/88 (97) HR:  69 NSR SPO2: 95 %   Weight: 309.8 lb w/ eqt Last weight: 307.4 lb Home weights: 300-303 lbs BMI today 38.72 today   VAD Indication: Destination therapy - Seen by [facility] for transplant. Transplant team advised for VAD. Once pt lost 25lbs they would reconsider for transplant.   LVAD assessment: HM III : Speed: 6200 rpms Flow: 6.5 Power: 5.4 w    PI: 3.3 Alarms: no clinical alarms  Events:>100 PI events  Fixed speed: 6200 Low speed limit: 5900  Primary controller: back up battery due for replacement in 29 months Secondary controller:  back up battery due for replacement in 29 months   I reviewed the LVAD parameters from today and compared the results to the patient's prior recorded data. LVAD interrogation was NEGATIVE for significant power changes, NEGATIVE for clinical alarms and STABLE for PI events/speed drops. No programming changes were made and pump is functioning within specified parameters. Pt is performing daily controller and system monitor self tests along with completing weekly and monthly maintenance for LVAD equipment.   LVAD equipment check completed and is in good working order. Back-up equipment present. Charged back up battery and performed self-test on equipment.    Annual Equipment Maintenance on UBC/PM was performed on 05/03/2023.   Exit Site Care: Dressing change performed everyday by pt's wife Lenore at home.  Existing VAD dressing removed and site care performed using sterile technique. Drive line exit site cleaned with Chlora prep applicators x 2, rinsed with sterile saline and allowed to dry. 4 x 4 placed over site without silver strip. Exit site is unincorporated, the velour is fully implanted at exit site. Scant drainage dried on previous silver strip. Slight redness (likely from debriding from silver strip-advised wife to no longer use silver strip), no tenderness, foul odor or rash noted. Drive line anchor re-applied. Pt advised to advance to Tuesday/Friday dressing changes with daily kit without using silver strip as this may be causing the redness by debriding wound. Pt has sufficient kits at home.       Significant Events on VAD Support:     Device: [employer] Jude Therapies: ON VF 200 Last check: 04/16/23   BP & Labs:  MAP 106 - Doppler is reflecting Modified systolic   Hgb 12.3 - No S/S of bleeding. Specifically denies melena/BRBPR or nosebleeds.   LDH stable at 195 with established baseline of 190- 245. Denies tea-colored urine. No power elevations noted on interrogation.    Plan: Titrate afternoon Torsemide based on swelling/dizzines 2. Please wear compression stockings daily to help with leg swelling 3. Advance dressing changes to Tuesday and Friday without the silver strip 4. Return to VAD Clinic in 1 month  Carlton Adam RN, BSN VAD Coordinator 24/7 Pager [phone]

## 2023-06-14 ENCOUNTER — Telehealth: Payer: Self-pay

## 2023-06-14 NOTE — Telephone Encounter (Signed)
Transmission received. LVAd wanted to know if he got a shock.

## 2023-06-14 NOTE — Telephone Encounter (Signed)
Transmission received.  No recent episodes.  Presenting rhythm does show some atrial undersensing at times.    Recent atrial amplitude measures 0.5 mV.  Sensitivity is on auto.

## 2023-06-14 NOTE — Telephone Encounter (Signed)
Pt states he will send the transmission in 10 minutes when he get home.

## 2023-06-15 ENCOUNTER — Other Ambulatory Visit (HOSPITAL_COMMUNITY): Payer: Self-pay

## 2023-06-15 ENCOUNTER — Ambulatory Visit (HOSPITAL_COMMUNITY)
Admission: RE | Admit: 2023-06-15 | Discharge: 2023-06-15 | Disposition: A | Discharge status: Home / Self Care | Payer: 59 | Source: Ambulatory Visit | Attending: Internal Medicine | Admitting: Internal Medicine

## 2023-06-15 DIAGNOSIS — Z4509 Encounter for adjustment and management of other cardiac device: Secondary | ICD-10-CM | POA: Insufficient documentation

## 2023-06-15 DIAGNOSIS — Z95811 Presence of heart assist device: Secondary | ICD-10-CM

## 2023-06-15 DIAGNOSIS — Z7901 Long term (current) use of anticoagulants: Secondary | ICD-10-CM

## 2023-06-15 NOTE — Addendum Note (Signed)
Encounter addended by: Flora Lipps, RN on: 06/15/2023 2:51 PM  Actions taken: Clinical Note Signed

## 2023-06-15 NOTE — Progress Notes (Addendum)
Pt presents for hospital follow up in VAD Clinic today. Reports no problems with VAD equipment or concerns with drive line.  Pt called VAD Clinic yesterday afternoon complaining of a intermittent jerking/shocking sensation on his left side as if he is being shocked by his ICD. Pt advised to send transmission to device clinic. No recent episodes noted. Please see note from Device Clinic RN for details. Discussed with Dr. Gala Romney and pt brought into VAD Clinic for more detailed interrogation with [employer] rep. St Jude rep attempted to replicate event through all leads. No jerking/shocking sensation felt or seen by pt. Rep made adjustments to atrial sensitivity. All settings are stable. Dr. Gala Romney made aware by VAD Coordinator and St [employer] rep. Detailed report in pt's chart.  Pt advised to call VAD Clinic back if symptoms reoccur.  Exit Site Care: Dressing change performed twice a week by pt's wife Lenore at home. Existing VAD dressing removed and site care performed using sterile technique. Drive line exit site cleaned with Chlora prep applicators x 2, rinsed with sterile saline and allowed to dry. Exit site is unincorporated, the velour is fully implanted at exit site. Scant drainage noted on previous dressing. Mild redness underneath drive line. Cellerate applied and VASHE soaked 2x2. No redness, tenderness, foul odor or rash noted. Drive line anchor re-applied. Continue twice a week dressing changes with daily kit.         Plan: Return to VAD Clinic Thursday for INR  Simmie Davies RN, BSN VAD Coordinator 24/7 Pager [phone]

## 2023-06-18 NOTE — Progress Notes (Addendum)
LVAD CLINIC NOTE  PCP: Primary Cardiologist: [name], MD HF MD: DB  HPI:  Joe [name] is a 52 y.o. male with h/o morbid obesity, DM2, HTN, HL, OSA, CAD s/p multiple stents to LAD and LCX  and systolic HF s/[ Abbott CRT-D. Now s/p HM3 LVAD Implantation 11/14 by Dr. Donata Clay.  Follow up for Heart Failure/LVAD:   Recently torsemide increased from 60 daily to 60 bid. Now with multiple PI alarms and also orthostasis at times. Energy improving. Able to ADLs without problem. Denies orthopnea or PND. No fevers, chills or problems with driveline. No bleeding, melena or neuro symptoms. No VAD alarms. Taking all meds as prescribed. Continues to work on weight loss.    VAD Indication: Destination therapy - Seen by [facility] for transplant. Transplant team advised for VAD. Once pt lost 25lbs they would reconsider for transplant.   LVAD assessment: HM III : Speed: 6200 rpms Flow: 6.5 Power: 5.4 w    PI: 3.3 Alarms: no clinical alarms  Events:>100 PI events  Fixed speed: 6200 Low speed limit: 5900   Primary controller: back up battery due for replacement in 29 months Secondary controller:  back up battery due for replacement in 29 months   I reviewed the LVAD parameters from today and compared the results to the patient's prior recorded data. LVAD interrogation was NEGATIVE for significant power changes, NEGATIVE for clinical alarms and STABLE for PI events/speed drops. No programming changes were made and pump is functioning within specified parameters. Pt is performing daily controller and system monitor self tests along with completing weekly and monthly maintenance for LVAD equipment.   LVAD equipment check completed and is in good working order. Back-up equipment present. Charged back up battery and performed self-test on equipment.    Annual Equipment Maintenance on UBC/PM was performed on 05/03/2023.   Denies LVAD alarms.  Denies driveline trauma, erythema or drainage.   Denies ICD shocks.   Reports taking Coumadin as prescribed and adherence to anticoagulation based dietary restrictions.  Denies bright red blood per rectum or melena, no dark urine or hematuria.     Past Medical History:  Diagnosis Date   Abnormal EKG    Acute systolic heart failure (HCC) 09/06/2017   Angina pectoris (HCC) 09/05/2017   Body mass index 45.0-49.9, adult (HCC)    CAD S/P percutaneous coronary angioplasty 09/07/2017   mLAD and dLAD PCI with DES 09/06/17   CHF (congestive heart failure) (HCC)    Chronic systolic (congestive) heart failure (HCC) 03/19/2019   Complication of anesthesia 1995   "had hard time waking up; breathing w/vasectomy"   Coronary artery disease    Erectile dysfunction    Essential hypertension, benign    Fatigue    GERD (gastroesophageal reflux disease)    Gout    High cholesterol    "just started tx yesterday" (09/06/2017)   History of gout    "RX prn" (09/06/2017)   Ischemic cardiomyopathy 09/07/2017   EF 25-35%   Muscular chest pain    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   Below Left Nare (MOH's)   Obesity    Obstructive sleep apnea    OSA on CPAP    Plantar fasciitis    Pre-operative clearance 02/11/2018   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/02/2019   [employer] Jude   Right bundle branch block    Shortness of breath 09/05/2017   Testosterone deficiency    Type 2 diabetes mellitus with complication, without long-term current  use of insulin (HCC) 09/05/2017   Type II diabetes mellitus (HCC)    "started tx 08/28/2017"    Current Outpatient Medications  Medication Sig Dispense Refill   acetaminophen (TYLENOL) 500 MG tablet Take 1,000 mg by mouth every 6 (six) hours as needed for moderate pain or headache.     allopurinol (ZYLOPRIM) 100 MG tablet Take 100 mg by mouth daily.     amiodarone (PACERONE) 200 MG tablet Take 1 tablet (200 mg total) by mouth 2 (two) times daily. 60 tablet 5   digoxin (LANOXIN)  0.125 MG tablet Take 1 tablet (0.125 mg total) by mouth daily. 30 tablet 5   escitalopram (LEXAPRO) 10 MG tablet Take 10 mg by mouth at bedtime.     ezetimibe (ZETIA) 10 MG tablet Take 1 tablet (10 mg total) by mouth daily. 30 tablet 5   gabapentin (NEURONTIN) 300 MG capsule Take 300 mg by mouth 3 (three) times daily.     levothyroxine (SYNTHROID) 25 MCG tablet Take 25 mcg by mouth daily.     mexiletine (MEXITIL) 250 MG capsule Take 1 capsule (250 mg total) by mouth 2 (two) times daily. 60 capsule 11   omeprazole (PRILOSEC) 40 MG capsule Take 1 capsule (40 mg total) by mouth daily. 90 capsule 1   OZEMPIC, 1 MG/DOSE, 4 MG/3ML SOPN Inject 2 mg into the skin every Monday.     Polyethylene Glycol 400 (BLINK TEARS OP) Place 2 drops into both eyes 4 (four) times daily as needed (for dry eyes).     potassium chloride SA (KLOR-CON M) 20 MEQ tablet Take 3 tablets (60 mEq total) by mouth 2 (two) times daily. 540 tablet 1   rosuvastatin (CRESTOR) 40 MG tablet Take 1 tablet (40 mg total) by mouth daily. (Patient taking differently: Take 40 mg by mouth at bedtime.) 90 tablet 2   sildenafil (REVATIO) 20 MG tablet Take 1 tablet (20 mg total) by mouth 3 (three) times daily. 90 tablet 5   spironolactone (ALDACTONE) 25 MG tablet Take 1 tablet (25 mg total) by mouth daily. 30 tablet 5   torsemide (DEMADEX) 20 MG tablet Take 3 tablets (60 mg total) by mouth 2 (two) times daily. 30 tablet 3   colchicine 0.6 MG tablet Take 0.6 mg by mouth daily as needed (Gout). (Patient not taking: Reported on 06/08/2023)     diclofenac Sodium (VOLTAREN) 1 % GEL APPLY 1 G TOPICALLY DAILY. (Patient not taking: No sig reported) 100 g 0   warfarin (COUMADIN) 5 MG tablet Take 1.5 tablets (7.5 mg total) by mouth daily. 60 tablet 5   No current facility-administered medications for this encounter.      Vitals:   06/08/23 1606 06/08/23 1609  BP: 114/88 (!) 106/0  Pulse: 69   SpO2: 95%   Weight: (!) 140.5 kg (309 lb 12.8 oz)    Height: 6\' 3"  (1.905 m)    Vital Signs:  Doppler Pressure: 106 Automatic BP: 114/88 (97) HR:  69 NSR SPO2: 95 %   Weight: 309.8 lb w/ eqt Last weight: 307.4 lb Home weights: 300-303 lbs BMI today 38.72 today Physical Exam: General:  NAD.  HEENT: normal  Neck: supple. JVP not elevated.  Carotids 2+ bilat; no bruits. No lymphadenopathy or thryomegaly appreciated. Cor: LVAD hum.  Lungs: Clear. Abdomen: obese soft, nontender, non-distended. No hepatosplenomegaly. No bruits or masses. Good bowel sounds. Driveline site clean. Anchor in place.  Extremities: no cyanosis, clubbing, rash. Warm no 1+ edema  Neuro: alert & oriented  x 3. No focal deficits. Moves all 4 without problem       ASSESSMENT AND PLAN:    1.  Chronic Systolic HF s/p HM3 Implantation 11/14 - due to iCM - Echo 05/01/23: EF 20%, RV moderately down (improved) - RHC (9/24): elevated biventricular filling pressures with low PAPi; CI 2.2 - S/p HM III LVAD 05/03/23 by Dr. Maren Beach - RAMP echo 11/21: Speeds left at 6000.  Speed increased to 6100 on 11/27. Speed increased to 6200 on 05/18/23.  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  - NYHA II - Volume low. Continue toresemide 60 in am use in pm only as needed - Wear compression hose - Continue spiro 25 - Has been seen at [facility] and will consider for transplant once BMI down   2. LVAD management - s/p HM-3 implant 05/03/23 - VAD interrogated personally. Multiple PI events due to low volume. Continue toresemide 60 in am use in pm only as needed - INR 3.0 Goal 2.0-3.0 Discussed warfarin dosing with PharmD personally. - LDH 195 - MAPs mildly elevated. Can add low-dose Entresto or spiro) as needed once fluid status improved - DL looks good   3. H/o VT Arrest  Hx PVCs - had VT arrest x 2 in 10/24 - Continue Amio 200 mg bid and Mexiletine 250 mg bid   4. CAD with chronic stable angina - s/p multiple stents to LAD and LCX - LHC (9/24) with stable 3v CAD -   No s/s angina   5. DM2 - A1c 7.5 (10/24) - Per PCP   6. OSA - Not using CPAP, says machine was recalled a while ago. Needs new machine. - Followed outpatient   7. Morbid obesity - Body mass index is 38.72 kg/m. - Doing well with weight loss. Continue Ozempic - Understands need to get BMI down near 35 for transplant  I spent a total of 45 minutes today: 1) reviewing the patient's medical records including previous charts, labs and recent notes from other providers; 2) examining the patient and counseling them on their medical issues/explaining the plan of care; 3) adjusting meds as needed and 4) ordering lab work or other needed tests.     Arvilla Meres, MD  11:02 PM

## 2023-06-21 ENCOUNTER — Other Ambulatory Visit (HOSPITAL_COMMUNITY): Payer: 59

## 2023-06-21 ENCOUNTER — Ambulatory Visit (HOSPITAL_COMMUNITY)
Admission: RE | Admit: 2023-06-21 | Discharge: 2023-06-21 | Disposition: A | Discharge status: Home / Self Care | Payer: 59 | Source: Ambulatory Visit | Attending: Cardiology | Admitting: Cardiology

## 2023-06-21 ENCOUNTER — Ambulatory Visit (HOSPITAL_COMMUNITY): Payer: Self-pay | Admitting: Pharmacist

## 2023-06-21 DIAGNOSIS — Z4509 Encounter for adjustment and management of other cardiac device: Secondary | ICD-10-CM | POA: Insufficient documentation

## 2023-06-21 DIAGNOSIS — Z7901 Long term (current) use of anticoagulants: Secondary | ICD-10-CM | POA: Insufficient documentation

## 2023-06-21 DIAGNOSIS — Z95811 Presence of heart assist device: Secondary | ICD-10-CM | POA: Insufficient documentation

## 2023-06-21 LAB — PROTIME-INR
INR: 2.6 — ABNORMAL HIGH (ref 0.8–1.2)
Prothrombin Time: 28.3 s — ABNORMAL HIGH (ref 11.4–15.2)

## 2023-06-21 NOTE — Progress Notes (Signed)
 VAD coordinator met with Joe [name] and his wife Lenore in clinic to complete/review disability paperwork. All paperwork completed at this time. Lenore will submit paperwork as appropriate.   Isaiah Knoll RN VAD Coordinator  Office: [phone]  24/7 Pager: [phone]

## 2023-06-27 ENCOUNTER — Other Ambulatory Visit (HOSPITAL_COMMUNITY): Payer: Self-pay

## 2023-06-27 DIAGNOSIS — Z95811 Presence of heart assist device: Secondary | ICD-10-CM

## 2023-06-27 DIAGNOSIS — Z7901 Long term (current) use of anticoagulants: Secondary | ICD-10-CM

## 2023-06-28 ENCOUNTER — Other Ambulatory Visit (HOSPITAL_COMMUNITY): Payer: Self-pay | Admitting: Family Medicine

## 2023-06-28 ENCOUNTER — Telehealth (HOSPITAL_COMMUNITY): Payer: Self-pay

## 2023-06-28 DIAGNOSIS — Z95811 Presence of heart assist device: Secondary | ICD-10-CM

## 2023-06-28 MED ORDER — TORSEMIDE 20 MG PO TABS: 40.0000 mg | ORAL_TABLET | Freq: Every day | ORAL | 3 refills | Status: DC

## 2023-06-28 NOTE — Telephone Encounter (Signed)
 Pt called VAD Clinic reporting increased dizziness for two days and now noticing his VAD speed is intermittently dropping to 5900 (low speed). Discussed with Dr. Cherrie advised pt to STOP Torsemide  (currently on 60mg  BID) and increase fluid and salt intake. Pt to start Torsemide  40mg  daily if he begins to show symptoms of peripheral edema or shortness of breath. Advised for pt to call VAD Coordinator with any questions.   Schuyler Lunger RN, BSN VAD Coordinator 24/7 Pager [phone]

## 2023-07-04 ENCOUNTER — Ambulatory Visit (HOSPITAL_COMMUNITY)
Admission: RE | Admit: 2023-07-04 | Discharge: 2023-07-04 | Disposition: A | Discharge status: Home / Self Care | Payer: 59 | Source: Ambulatory Visit | Attending: Cardiology | Admitting: Cardiology

## 2023-07-04 ENCOUNTER — Ambulatory Visit (HOSPITAL_COMMUNITY): Payer: Self-pay | Admitting: Pharmacist

## 2023-07-04 DIAGNOSIS — Z7901 Long term (current) use of anticoagulants: Secondary | ICD-10-CM | POA: Diagnosis not present

## 2023-07-04 DIAGNOSIS — Z95811 Presence of heart assist device: Secondary | ICD-10-CM | POA: Diagnosis present

## 2023-07-04 DIAGNOSIS — M109 Gout, unspecified: Secondary | ICD-10-CM | POA: Insufficient documentation

## 2023-07-04 LAB — URIC ACID: Uric Acid, Serum: 5.5 mg/dL (ref 3.7–8.6)

## 2023-07-04 LAB — PROTIME-INR
INR: 2.3 — ABNORMAL HIGH (ref 0.8–1.2)
Prothrombin Time: 25.3 s — ABNORMAL HIGH (ref 11.4–15.2)

## 2023-07-04 NOTE — Addendum Note (Signed)
 Encounter addended by: Antionette Bath, RN on: 07/04/2023 10:21 AM  Actions taken: Visit diagnoses modified, Order list changed, Diagnosis association updated

## 2023-07-05 ENCOUNTER — Other Ambulatory Visit (HOSPITAL_COMMUNITY): Payer: 59

## 2023-07-11 ENCOUNTER — Telehealth (HOSPITAL_COMMUNITY): Payer: Self-pay | Admitting: *Deleted

## 2023-07-11 DIAGNOSIS — Z5181 Encounter for therapeutic drug level monitoring: Secondary | ICD-10-CM

## 2023-07-11 DIAGNOSIS — Z7901 Long term (current) use of anticoagulants: Secondary | ICD-10-CM

## 2023-07-11 DIAGNOSIS — Z95811 Presence of heart assist device: Secondary | ICD-10-CM

## 2023-07-11 NOTE — Addendum Note (Signed)
Addended by: Alyce Pagan B on: 07/11/2023 03:07 PM   Modules accepted: Orders

## 2023-07-11 NOTE — Telephone Encounter (Signed)
Received call from patient reporting dizziness x 3 days. States he feels "like I am going to pass out". Stumbled, but did not fall, yesterday at the grocery store due to dizziness. States he is drinking 2 L per day. VAD numbers stable: Speed 6200 Flow 6.0 PI 2.8 Power 5.4 w. Pt reported similar symptom on 06/28/23 and was advised by Dr Gala Romney at that time to STOP Torsemide (was taking 60mg  BID) and increase fluid and salt intake. Instructed restart Torsemide 40mg  daily at that time if he begins to show symptoms of peripheral edema or shortness of breath. Reports he stopped Torsemide for several days, then restarted 60 mg BID instead of 40 mg daily as instructed.  Per Lenore large scab fell off exit site while she was cleansing, and now there is a "hole." Requesting VAD coordinator assess drive line.   Plan for nurse visit in VAD clinic tomorrow. Advised to HOLD evening Torsemide dose that he has been taking. Encouraged increased PO intake, and to be careful when standing up/ambulating with dizziness. Advised to notify VAD coordinator if symptoms worsen. He and Lenore verbalized understanding.   Alyce Pagan RN VAD Coordinator  Office: [phone]  24/7 Pager: [phone]

## 2023-07-12 ENCOUNTER — Ambulatory Visit (HOSPITAL_COMMUNITY): Payer: Self-pay | Admitting: Pharmacist

## 2023-07-12 ENCOUNTER — Ambulatory Visit (HOSPITAL_COMMUNITY)
Admission: RE | Admit: 2023-07-12 | Discharge: 2023-07-12 | Disposition: A | Discharge status: Home / Self Care | Payer: 59 | Source: Ambulatory Visit | Attending: Cardiology | Admitting: Cardiology

## 2023-07-12 ENCOUNTER — Ambulatory Visit (HOSPITAL_COMMUNITY)
Admission: RE | Admit: 2023-07-12 | Discharge: 2023-07-12 | Disposition: A | Discharge status: Home / Self Care | Payer: 59 | Source: Ambulatory Visit | Attending: Cardiothoracic Surgery | Admitting: Cardiothoracic Surgery

## 2023-07-12 ENCOUNTER — Encounter (HOSPITAL_COMMUNITY): Payer: Self-pay

## 2023-07-12 VITALS — BP 99/74 | HR 74 | Wt 314.2 lb

## 2023-07-12 DIAGNOSIS — Z95811 Presence of heart assist device: Secondary | ICD-10-CM | POA: Insufficient documentation

## 2023-07-12 DIAGNOSIS — Z5181 Encounter for therapeutic drug level monitoring: Secondary | ICD-10-CM | POA: Diagnosis not present

## 2023-07-12 DIAGNOSIS — I5023 Acute on chronic systolic (congestive) heart failure: Secondary | ICD-10-CM | POA: Insufficient documentation

## 2023-07-12 DIAGNOSIS — Z7901 Long term (current) use of anticoagulants: Secondary | ICD-10-CM

## 2023-07-12 LAB — BASIC METABOLIC PANEL
Anion gap: 11 (ref 5–15)
BUN: 18 mg/dL (ref 6–20)
CO2: 26 mmol/L (ref 22–32)
Calcium: 9.1 mg/dL (ref 8.9–10.3)
Chloride: 94 mmol/L — ABNORMAL LOW (ref 98–111)
Creatinine, Ser: 1.08 mg/dL (ref 0.61–1.24)
GFR, Estimated: >60 mL/min (ref >60)
Glucose, Bld: 362 mg/dL — ABNORMAL HIGH (ref 70–99)
Potassium: 4.4 mmol/L (ref 3.5–5.1)
Sodium: 131 mmol/L — ABNORMAL LOW (ref 135–145)

## 2023-07-12 LAB — CBC
HCT: 38.5 % — ABNORMAL LOW (ref 39.0–52.0)
Hemoglobin: 13.3 g/dL (ref 13.0–17.0)
MCH: 31 pg (ref 26.0–34.0)
MCHC: 34.5 g/dL (ref 30.0–36.0)
MCV: 89.7 fL (ref 80.0–100.0)
Platelets: 228 10*3/uL (ref 150–400)
RBC: 4.29 MIL/uL (ref 4.22–5.81)
RDW: 13.8 % (ref 11.5–15.5)
WBC: 9 10*3/uL (ref 4.0–10.5)
nRBC: 0 % (ref 0.0–0.2)

## 2023-07-12 LAB — LACTATE DEHYDROGENASE: LDH: 195 U/L — ABNORMAL HIGH (ref 98–192)

## 2023-07-12 LAB — PROTIME-INR
INR: 2.6 — ABNORMAL HIGH (ref 0.8–1.2)
Prothrombin Time: 28.1 s — ABNORMAL HIGH (ref 11.4–15.2)

## 2023-07-12 LAB — DIGOXIN LEVEL: Digoxin Level: 0.4 ng/mL — ABNORMAL LOW (ref 0.8–2.0)

## 2023-07-12 MED ORDER — CEFADROXIL 500 MG PO CAPS: 1000.0000 mg | ORAL_CAPSULE | Freq: Two times a day (BID) | ORAL | 0 refills | Status: AC

## 2023-07-12 MED ORDER — CEFADROXIL 500 MG PO CAPS: 1000.0000 mg | ORAL_CAPSULE | Freq: Two times a day (BID) | ORAL | Status: DC

## 2023-07-12 NOTE — Patient Instructions (Addendum)
Increase fluid intake  HOLD Torsemide if you become symptomatic prior to appt Thursday you can start 40mg  of Torsemide daily Start Cefadroxil 1000mg  twice a day for 14 days CXR today Coumadin dosing per [facility] Return to VAD Clinic in 1 week for follow up with Dr. Gala Romney

## 2023-07-12 NOTE — Progress Notes (Addendum)
Pt presents for sick visit in VAD Clinic today. Reports no problems with VAD equipment or concerns with drive line.  Pt called VAD Clinic yesterday afternoon complaining of increased dizziness and feeling like he was going to pass out. Pt reports misunderstanding of Torsemide dose. Ordered dose was 40mg  once daily and pt was taking 60mg  BID.   Pt walked into VAD Clinic unassisted. Reports increase dizziness and lightheadedness when changing positions. Denies shortness of breath or signs of bleeding. Pt states first episode started 1/15 and he got dizzy and slumped over a trash can. Then pt reports falling yesterday around 1400 and has a large bruise on his left chest. This was examined by Dr. Maren Beach and a CXR was ordered for further assessment. Discussed images with Dr. Maren Beach and MD advises CXR to be repeated next visit to follow.  [employer] Jude rep interrogated device in VAD Clinic showing no events. See below:    Orthostatic vitals obtained. VAD speed dropped to low speed upon standing. Dr. Gasper Lloyd assessed pt at bedside with Echo. Pt over diuresed on exam per MD. 20G IV placed in right forearm and 1L given. Pt reports improvement in symptoms following infusion. Instructed to HOLD Torsemide and if he becomes symptomatic only take 40mg  daily. Advised to increase fluid and salt intake.   Vital Signs:    HR BP Flow Speed Power PI  Lying  73 110/68 (81) 6.2 6200 5.4 3.2  Sitting  74 104/67 (75) 6.4 6200 5.4 2.5  Standing 83 99/74 (84) 5.7 5900 5.4 5.5   SPO2: 95 %   Weight: 314.2 lb w/ eqt Last weight: 309.8 lb w/ eqt BMI today 39.2 today   VAD Indication: Destination therapy - Seen by [facility] for transplant. Transplant team advised for VAD. Once pt lost 25lbs they would reconsider for transplant.   LVAD assessment: HM III : Speed: 6200 rpms Flow: 6.2 Power: 5.4 w    PI: 3.2 Alarms: no clinical alarms  Events:0-50 PI events  Fixed speed: 6200 Low speed limit: 5900   Primary  controller: back up battery due for replacement in 29 months Secondary controller:  back up battery due for replacement in 29 months   I reviewed the LVAD parameters from today and compared the results to the patient's prior recorded data. LVAD interrogation was NEGATIVE for significant power changes, NEGATIVE for clinical alarms and STABLE for PI events/speed drops. No programming changes were made and pump is functioning within specified parameters. Pt is performing daily controller and system monitor self tests along with completing weekly and monthly maintenance for LVAD equipment.   LVAD equipment check completed and is in good working order. Back-up equipment present. Charged back up battery and performed self-test on equipment.    Annual Equipment Maintenance on UBC/PM was performed on 05/03/2023.  Exit Site Care: Dressing change performed twice a week by pt's wife Lenore at home. Wife reports large scab removed from skin leaving are open and raw. Pt reports no trauma to the exit site that he is aware of. Existing VAD dressing removed and site care performed using sterile technique. Drive line exit site cleaned with Chlora prep applicators x 2, rinsed with sterile saline and allowed to dry. VASHE soaked 2x2 placed flush to exit site. Exit site is unincorporated, the velour is fully implanted at exit site. Moderate sanguinous drainage noted on previous dressing. Pt has increased redness and exit site and underneath drive line. Pt reports tenderness and burning when cleased with CHG. foul odor  or rash noted. Drive line anchor re-applied. Return to daily dressings using VASHE soaked 2x2 at exit site. Pt given 7 daily kits, 7 anchors, 2x2's and VASHE.  Discussed with Dr. Gala Romney will start Cefadroxil 1000mg  BID for 14 days per VAD protocol.        Significant Events on VAD Support:      Device: St Jude Therapies: ON VF 200 Last check: 04/16/23   BP & Labs:  MAP 106 - Doppler is  reflecting Modified systolic   Hgb 13.3 - No S/S of bleeding. Specifically denies melena/BRBPR or nosebleeds.   LDH stable at 195 with established baseline of 190- 245. Denies tea-colored urine. No power elevations noted on interrogation.  Plan: Increase fluid and salt intake  HOLD Torsemide if you become symptomatic prior to appt Thursday you can start 40mg  of Torsemide daily Start Cefadroxil 1000mg  twice a day for 14 days Change VAD dressing daily CXR today Coumadin dosing per [facility] Return to VAD Clinic in 1 week for follow up with Dr. Nadeen Landau RN, BSN VAD Coordinator 24/7 Pager [phone]

## 2023-07-13 NOTE — Progress Notes (Signed)
CSW met with patient and wife in the clinic. Patient had visit due to not feeling well but reports he is improved since arriving at the clinic. Wife asked about LVAD Support Group and looking forward to next meeting. Patient and wife shared that they have been making day trips all over [place] and visiting different restaurants as they enjoy trying new foods and love to go for drives. Patient and wife deny any concerns at this time. CSW available as needed. Lasandra Beech, LCSW, CCSW-MCS [phone]

## 2023-07-14 ENCOUNTER — Telehealth (HOSPITAL_COMMUNITY): Payer: Self-pay | Admitting: Unknown Physician Specialty

## 2023-07-14 NOTE — Telephone Encounter (Signed)
Received page from pt at 1:53pm today c/o of weight gain of 10 lbs and increased SOB since receiving fluid and holding Torsemide for low flows and dizziness/presyncope on Thursday in clinic. D/w Dr Gala Romney, pt instructed to restart Torsemide 40 mg today. Pt instructed that if his SOB does not improve to take another dose this evening and then resume a dose of Torsemide 40mg  daily until we see him Thursday next week. Pt verbalized understanding of all instructions. Pt informed to page again if symptoms do not improve and weight doesn't decrease.  Carlton Adam RN, BSN VAD Coordinator 24/7 Pager [phone]

## 2023-07-16 ENCOUNTER — Ambulatory Visit: Payer: BC Managed Care – PPO

## 2023-07-18 ENCOUNTER — Other Ambulatory Visit (HOSPITAL_COMMUNITY): Payer: Self-pay

## 2023-07-18 DIAGNOSIS — Z95811 Presence of heart assist device: Secondary | ICD-10-CM

## 2023-07-18 DIAGNOSIS — Z7901 Long term (current) use of anticoagulants: Secondary | ICD-10-CM

## 2023-07-19 ENCOUNTER — Ambulatory Visit (HOSPITAL_COMMUNITY)
Admission: RE | Admit: 2023-07-19 | Discharge: 2023-07-19 | Disposition: A | Discharge status: Home / Self Care | Payer: 59 | Source: Ambulatory Visit | Attending: Internal Medicine | Admitting: Internal Medicine

## 2023-07-19 ENCOUNTER — Ambulatory Visit (HOSPITAL_COMMUNITY): Payer: Self-pay | Admitting: Pharmacist

## 2023-07-19 VITALS — BP 80/65 | HR 75 | Ht 75.0 in | Wt 319.0 lb

## 2023-07-19 DIAGNOSIS — Z95811 Presence of heart assist device: Secondary | ICD-10-CM

## 2023-07-19 DIAGNOSIS — E118 Type 2 diabetes mellitus with unspecified complications: Secondary | ICD-10-CM

## 2023-07-19 DIAGNOSIS — I2510 Atherosclerotic heart disease of native coronary artery without angina pectoris: Secondary | ICD-10-CM | POA: Diagnosis not present

## 2023-07-19 DIAGNOSIS — Z4801 Encounter for change or removal of surgical wound dressing: Secondary | ICD-10-CM | POA: Diagnosis present

## 2023-07-19 DIAGNOSIS — Z7901 Long term (current) use of anticoagulants: Secondary | ICD-10-CM

## 2023-07-19 DIAGNOSIS — I5022 Chronic systolic (congestive) heart failure: Secondary | ICD-10-CM | POA: Diagnosis not present

## 2023-07-19 DIAGNOSIS — Z4509 Encounter for adjustment and management of other cardiac device: Secondary | ICD-10-CM | POA: Insufficient documentation

## 2023-07-19 LAB — CUP PACEART REMOTE DEVICE CHECK
Battery Remaining Longevity: 32 mo
Battery Remaining Percentage: 37 %
Battery Voltage: 2.92 V
Brady Statistic AP VP Percent: 1 %
Brady Statistic AP VS Percent: 1 %
Brady Statistic AS VP Percent: 99 %
Brady Statistic AS VS Percent: 1 %
Brady Statistic RA Percent Paced: 1 %
Date Time Interrogation Session: 20250130000020
HighPow Impedance: 80 Ohm
HighPow Impedance: 80 Ohm
Implantable Lead Connection Status: 753985
Implantable Lead Connection Status: 753985
Implantable Lead Connection Status: 753985
Implantable Lead Implant Date: 20200930
Implantable Lead Implant Date: 20200930
Implantable Lead Implant Date: 20200930
Implantable Lead Location: 753858
Implantable Lead Location: 753859
Implantable Lead Location: 753860
Implantable Pulse Generator Implant Date: 20200930
Lead Channel Impedance Value: 1025 Ohm
Lead Channel Impedance Value: 390 Ohm
Lead Channel Impedance Value: 550 Ohm
Lead Channel Pacing Threshold Amplitude: 0.75 V
Lead Channel Pacing Threshold Amplitude: 1 V
Lead Channel Pacing Threshold Amplitude: 1.25 V
Lead Channel Pacing Threshold Pulse Width: 0.5 ms
Lead Channel Pacing Threshold Pulse Width: 0.5 ms
Lead Channel Pacing Threshold Pulse Width: 0.5 ms
Lead Channel Sensing Intrinsic Amplitude: 0.7 mV
Lead Channel Sensing Intrinsic Amplitude: 12 mV
Lead Channel Setting Pacing Amplitude: 2 V
Lead Channel Setting Pacing Amplitude: 2.5 V
Lead Channel Setting Pacing Amplitude: 2.5 V
Lead Channel Setting Pacing Pulse Width: 0.5 ms
Lead Channel Setting Pacing Pulse Width: 0.5 ms
Lead Channel Setting Sensing Sensitivity: 0.5 mV
Pulse Gen Serial Number: 9901124
Zone Setting Status: 755011

## 2023-07-19 LAB — CBC
HCT: 39 % (ref 39.0–52.0)
Hemoglobin: 13.3 g/dL (ref 13.0–17.0)
MCH: 31.4 pg (ref 26.0–34.0)
MCHC: 34.1 g/dL (ref 30.0–36.0)
MCV: 92 fL (ref 80.0–100.0)
Platelets: 219 10*3/uL (ref 150–400)
RBC: 4.24 MIL/uL (ref 4.22–5.81)
RDW: 14.5 % (ref 11.5–15.5)
WBC: 7.3 10*3/uL (ref 4.0–10.5)
nRBC: 0 % (ref 0.0–0.2)

## 2023-07-19 LAB — COMPREHENSIVE METABOLIC PANEL
ALT: 47 U/L — ABNORMAL HIGH (ref 0–44)
AST: 45 U/L — ABNORMAL HIGH (ref 15–41)
Albumin: 3.5 g/dL (ref 3.5–5.0)
Alkaline Phosphatase: 99 U/L (ref 38–126)
Anion gap: 11 (ref 5–15)
BUN: 15 mg/dL (ref 6–20)
CO2: 28 mmol/L (ref 22–32)
Calcium: 8.9 mg/dL (ref 8.9–10.3)
Chloride: 96 mmol/L — ABNORMAL LOW (ref 98–111)
Creatinine, Ser: 1.3 mg/dL — ABNORMAL HIGH (ref 0.61–1.24)
GFR, Estimated: >60 mL/min (ref >60)
Glucose, Bld: 334 mg/dL — ABNORMAL HIGH (ref 70–99)
Potassium: 4.5 mmol/L (ref 3.5–5.1)
Sodium: 135 mmol/L (ref 135–145)
Total Bilirubin: 0.8 mg/dL (ref 0.0–1.2)
Total Protein: 7.5 g/dL (ref 6.5–8.1)

## 2023-07-19 LAB — DIGOXIN LEVEL: Digoxin Level: 0.7 ng/mL — ABNORMAL LOW (ref 0.8–2.0)

## 2023-07-19 LAB — MAGNESIUM: Magnesium: 2.2 mg/dL (ref 1.7–2.4)

## 2023-07-19 LAB — HEMOGLOBIN A1C
Hgb A1c MFr Bld: 9 % — ABNORMAL HIGH (ref 4.8–5.6)
Mean Plasma Glucose: 211.6 mg/dL

## 2023-07-19 LAB — PREALBUMIN: Prealbumin: 29 mg/dL (ref 18–38)

## 2023-07-19 LAB — LACTATE DEHYDROGENASE: LDH: 216 U/L — ABNORMAL HIGH (ref 98–192)

## 2023-07-19 LAB — PROTIME-INR
INR: 2.1 — ABNORMAL HIGH (ref 0.8–1.2)
Prothrombin Time: 23.8 s — ABNORMAL HIGH (ref 11.4–15.2)

## 2023-07-19 MED ORDER — AMIODARONE HCL 200 MG PO TABS: 200.0000 mg | ORAL_TABLET | Freq: Every day | ORAL | 5 refills | Status: DC

## 2023-07-19 NOTE — Progress Notes (Signed)
Pt presents for 1 week f/u in VAD Clinic today w/3 mo intermacs. Reports no problems with VAD equipment or concerns with drive line.  Pt walked into VAD Clinic unassisted. Reports dizziness and lightheadedness is much better. Pt states that he has shortness of breath when he is up walking around. His weight is up 5 lbs today. He denies signs of bleeding.   Pt sustained a fall last week due to dizziness. Chest xray was reviewed by DR Donata Clay. Another chest xray obtained today per Dr Donata Clay.   Pt has been taking Torsemide 40 mg daily.  Pt has 1 more week on Cefadroxil prescribed for driveline redness last week.  Vital Signs:  Doppler Pressure: 94 Automatic BP: UTO HR:  75 NSR SPO2: 95 %   Weight: 319 lb w/ eqt Last weight: 314.2 lb w/ eqt BMI today 39.87 today   VAD Indication: Destination therapy - Seen by [facility] for transplant. Transplant team advised for VAD. Once pt lost 25lbs they would reconsider for transplant.   LVAD assessment: HM III : Speed: 6200 rpms Flow: 6.2 Power: 5.5 w    PI: 3.4 Alarms: no clinical alarms  Events: 5-20 PI events  Fixed speed: 6200 Low speed limit: 5900   Primary controller: back up battery due for replacement in 28 months Secondary controller:  back up battery due for replacement in 28 months   I reviewed the LVAD parameters from today and compared the results to the patient's prior recorded data. LVAD interrogation was NEGATIVE for significant power changes, NEGATIVE for clinical alarms and STABLE for PI events/speed drops. No programming changes were made and pump is functioning within specified parameters. Pt is performing daily controller and system monitor self tests along with completing weekly and monthly maintenance for LVAD equipment.   LVAD equipment check completed and is in good working order. Back-up equipment present. Charged back up battery and performed self-test on equipment.    Annual Equipment Maintenance on UBC/PM was  performed on 05/03/2023.  Exit Site Care: Dressing change performed daily by pt's wife Lenore at home. Pt reports no trauma to the exit site that he is aware of. Existing VAD dressing removed and site care performed using sterile technique. Drive line exit site cleaned with Chlora prep applicators x 2, rinsed with sterile saline and allowed to dry. VASHE soaked 2x2 placed flush to exit site. Exit site is unincorporated, the velour is fully implanted at exit site. No redness, drainage, foul odor or rash noted. Drive line anchor re-applied. Advance to twice weekly dressing changes using VASHE soaked 2x2 at exit site. Pt given 12 daily kits and 12 anchors. It appears the redness around his driveline is his skin pigment--all his other scars from surgery appear the same.         Significant Events on VAD Support:      Device: St Jude Therapies: ON VF 200 Last check: 04/16/23   BP & Labs:  MAP 94 - Doppler is reflecting Modified systolic   Hgb 13.3 - No S/S of bleeding. Specifically denies melena/BRBPR or nosebleeds.   LDH stable at 195 with established baseline of 190- 245. Denies tea-colored urine. No power elevations noted on interrogation.  3 mo. Intermacs follow up completed including:  Quality of Life, KCCQ-12, and Neurocognitive trail making.   Pt completed 1150 feet during 6 minute walk.  Patient Goals: Go on a cruise in April  [place] Cardiomyopathy Questionnaire     07/19/2023    1:38  PM  KCCQ-12  1 a. Ability to shower/bathe Other, Did not do  1 b. Ability to walk 1 block Moderately limited  1 c. Ability to hurry/jog Other, Did not do  2. Edema feet/ankles/legs 1-2 times a week  3. Limited by fatigue All of the time  4. Limited by dyspnea Several times a day  5. Sitting up / on 3+ pillows 1-2 times a week  6. Limited enjoyment of life Moderately limited  7. Rest of life w/ symptoms Somewhat satisfied  8 a. Participation in hobbies Severely limited  8 b.  Participation in chores Limited quite a bit  8 c. Visiting family/friends Moderately limited     Plan: Decrease Amiodarone 200 mg daily If pt has swelling and PI is >3 take extra Lasix Advance dressings to twice a week changes  CXR today Coumadin dosing per [facility] All paperwork sent for home INR machine through [facility]. Return in 2 weeks for INR Return in 1 month for driveline check for possible advancement Return to VAD Clinic in 2 month for follow up with Dr. Ayesha Rumpf RN, BSN VAD Coordinator 24/7 Pager [phone]

## 2023-07-19 NOTE — Patient Instructions (Addendum)
Will obtain chest xray today May take extra Lasix if needed We will send paperwork for home INR machine Pt needs to begin Cardiac Rehab Return for driveline check in 1 month Return for full visit with Dr Gala Romney in 2 months

## 2023-07-27 ENCOUNTER — Other Ambulatory Visit (HOSPITAL_COMMUNITY): Payer: Self-pay | Admitting: Unknown Physician Specialty

## 2023-07-27 DIAGNOSIS — Z95811 Presence of heart assist device: Secondary | ICD-10-CM

## 2023-07-27 DIAGNOSIS — Z7901 Long term (current) use of anticoagulants: Secondary | ICD-10-CM

## 2023-07-30 ENCOUNTER — Ambulatory Visit (HOSPITAL_COMMUNITY): Payer: Self-pay | Admitting: Pharmacist

## 2023-07-30 LAB — POCT INR: INR: 2.1 (ref 2.0–3.0)

## 2023-08-02 ENCOUNTER — Other Ambulatory Visit (HOSPITAL_COMMUNITY): Payer: 59

## 2023-08-03 ENCOUNTER — Ambulatory Visit (HOSPITAL_COMMUNITY): Payer: Self-pay | Admitting: Pharmacist

## 2023-08-03 ENCOUNTER — Ambulatory Visit (HOSPITAL_COMMUNITY)
Admission: RE | Admit: 2023-08-03 | Discharge: 2023-08-03 | Disposition: A | Discharge status: Home / Self Care | Payer: 59 | Source: Ambulatory Visit | Attending: Cardiology | Admitting: Cardiology

## 2023-08-03 VITALS — BP 98/84

## 2023-08-03 DIAGNOSIS — R42 Dizziness and giddiness: Secondary | ICD-10-CM | POA: Insufficient documentation

## 2023-08-03 DIAGNOSIS — Z8674 Personal history of sudden cardiac arrest: Secondary | ICD-10-CM | POA: Insufficient documentation

## 2023-08-03 DIAGNOSIS — G4733 Obstructive sleep apnea (adult) (pediatric): Secondary | ICD-10-CM | POA: Diagnosis not present

## 2023-08-03 DIAGNOSIS — Z79899 Other long term (current) drug therapy: Secondary | ICD-10-CM | POA: Diagnosis not present

## 2023-08-03 DIAGNOSIS — Z7984 Long term (current) use of oral hypoglycemic drugs: Secondary | ICD-10-CM | POA: Insufficient documentation

## 2023-08-03 DIAGNOSIS — I25118 Atherosclerotic heart disease of native coronary artery with other forms of angina pectoris: Secondary | ICD-10-CM | POA: Insufficient documentation

## 2023-08-03 DIAGNOSIS — Z95811 Presence of heart assist device: Secondary | ICD-10-CM | POA: Insufficient documentation

## 2023-08-03 DIAGNOSIS — Z7985 Long-term (current) use of injectable non-insulin antidiabetic drugs: Secondary | ICD-10-CM | POA: Insufficient documentation

## 2023-08-03 DIAGNOSIS — R0602 Shortness of breath: Secondary | ICD-10-CM | POA: Insufficient documentation

## 2023-08-03 DIAGNOSIS — I110 Hypertensive heart disease with heart failure: Secondary | ICD-10-CM | POA: Insufficient documentation

## 2023-08-03 DIAGNOSIS — I5023 Acute on chronic systolic (congestive) heart failure: Secondary | ICD-10-CM

## 2023-08-03 DIAGNOSIS — I5022 Chronic systolic (congestive) heart failure: Secondary | ICD-10-CM | POA: Diagnosis not present

## 2023-08-03 DIAGNOSIS — E119 Type 2 diabetes mellitus without complications: Secondary | ICD-10-CM | POA: Insufficient documentation

## 2023-08-03 LAB — BASIC METABOLIC PANEL
Anion gap: 12 (ref 5–15)
BUN: 15 mg/dL (ref 6–20)
CO2: 28 mmol/L (ref 22–32)
Calcium: 9.1 mg/dL (ref 8.9–10.3)
Chloride: 96 mmol/L — ABNORMAL LOW (ref 98–111)
Creatinine, Ser: 1.5 mg/dL — ABNORMAL HIGH (ref 0.61–1.24)
GFR, Estimated: 56 mL/min — ABNORMAL LOW (ref >60)
Glucose, Bld: 214 mg/dL — ABNORMAL HIGH (ref 70–99)
Potassium: 4.1 mmol/L (ref 3.5–5.1)
Sodium: 136 mmol/L (ref 135–145)

## 2023-08-03 LAB — CBC
HCT: 41.5 % (ref 39.0–52.0)
Hemoglobin: 14.1 g/dL (ref 13.0–17.0)
MCH: 30.5 pg (ref 26.0–34.0)
MCHC: 34 g/dL (ref 30.0–36.0)
MCV: 89.8 fL (ref 80.0–100.0)
Platelets: 218 10*3/uL (ref 150–400)
RBC: 4.62 MIL/uL (ref 4.22–5.81)
RDW: 14.3 % (ref 11.5–15.5)
WBC: 7.5 10*3/uL (ref 4.0–10.5)
nRBC: 0 % (ref 0.0–0.2)

## 2023-08-03 LAB — PROTIME-INR
INR: 1.5 — ABNORMAL HIGH (ref 0.8–1.2)
Prothrombin Time: 17.9 s — ABNORMAL HIGH (ref 11.4–15.2)

## 2023-08-03 LAB — LACTATE DEHYDROGENASE: LDH: 203 U/L — ABNORMAL HIGH (ref 98–192)

## 2023-08-03 MED ORDER — ONDANSETRON 4 MG PO TBDP: 4.0000 mg | ORAL_TABLET | Freq: Three times a day (TID) | ORAL | 0 refills | Status: AC | PRN

## 2023-08-03 NOTE — Progress Notes (Signed)
LVAD INR

## 2023-08-03 NOTE — Progress Notes (Signed)
Pt presents for 1 week f/u in VAD Clinic today w/3 mo intermacs. Reports no problems with VAD equipment or concerns with drive line.  Pt walked into VAD Clinic unassisted. Reports dizziness and lightheadedness is much better. Pt states that he has shortness of breath when he is up walking around. His weight is up 5 lbs today. He denies signs of bleeding.   Pt presents to clinic today for dressing change.   Pt states that he has been very dizzy lately but is also c/o SOB. Pt change to full visit for DR Elwyn Lade.   Pt has been taking Torsemide 40 mg daily. He hasn't taken any extra Torsemide in 3-4 days.   Pts speed is currently 6200, while in clinic he is constantly dropping to his low speed of 5900 while at rest. Dr Elwyn Lade into see pt, handheld echo done at bedside. Speed dropped to 6000. After a few minutes pt continues to drop to his low speed of 5700. Speed was dropped to 5800. Pt tolerated 5800 without significant drops to his low speed.   Pt states that the dizziness is making him feel nauseous. Zofran sent to pts pharmacy per Dr Elwyn Lade.  Vital Signs:  Automatic BP: 98/84 (90) HR:  75 NSR SPO2: 95 %   Weight: 319 lb w/ eqt Last weight: 314.2 lb w/ eqt BMI today 39.87 today   VAD Indication: Destination therapy - Seen by [facility] for transplant. Transplant team advised for VAD. Once pt loses 25lbs they would reconsider for transplant.   LVAD assessment: HM III : Speed: 6200 rpms Flow: 6.4 Power: 5.5 w    PI: 3.3 Alarms: no clinical alarms  Events: 100+ PI events  Fixed speed: 6200 Low speed limit: 5900  D/c from clinic on : Speed: 5800 rpms Flow: 6 Power: 4.8 w    PI: 2.2  Fixed speed: 5800 Low speed limit: 5500   Primary controller: back up battery due for replacement in 28 months Secondary controller:  back up battery due for replacement in 28 months   I reviewed the LVAD parameters from today and compared the results to the patient's prior recorded data. LVAD  interrogation was NEGATIVE for significant power changes, NEGATIVE for clinical alarms and STABLE for PI events/speed drops. No programming changes were made and pump is functioning within specified parameters. Pt is performing daily controller and system monitor self tests along with completing weekly and monthly maintenance for LVAD equipment.   LVAD equipment check completed and is in good working order. Back-up equipment present. Charged back up battery and performed self-test on equipment.    Annual Equipment Maintenance on UBC/PM was performed on 05/03/2023.  Exit Site Care: Dressing change performed twice a week by pt's wife Lenore at home. Existing VAD dressing removed and site care performed using sterile technique. Drive line exit site cleaned with Chlora prep applicators x 2, rinsed with sterile saline and allowed to dry. Covered with dry 4 x 4. Exit site is partially incorporated, the velour is fully implanted at exit site. Scant serous drainage. No redness, foul odor or rash noted. Drive line anchor re-applied. Advance to weekly dressing changes using the daily kit. Pt has sufficient kits at home.    Significant Events on VAD Support:      Device: St Jude Therapies: ON VF 200 Last check: 04/16/23   BP & Labs:  MAP 90 - Doppler is reflecting Modified systolic   Hgb 14.1 - No S/S of bleeding. Specifically denies melena/BRBPR or  nosebleeds.   LDH stable at 203 with established baseline of 190- 245. Denies tea-colored urine. No power elevations noted on interrogation.    Plan: We decreased your speed to 5800 today Right Heart Cath scheduled for Monday 2/17 at 1200-nothing to eat or drink after midnight on Sunday. Report to Admitting at the main entrance at 1000 am.   Carlton Adam RN, BSN VAD Coordinator 24/7 Pager [phone]

## 2023-08-05 NOTE — Progress Notes (Signed)
LVAD CLINIC NOTE  PCP: Primary Cardiologist: [name], MD HF MD: DB   Chief complaint: HF  HPI:  Joe [name] is a 53 y.o. male with h/o morbid obesity, DM2, HTN, HL, OSA, CAD s/p multiple stents to LAD and LCX  and systolic HF s/[ Abbott CRT-D. Now s/p HM3 LVAD Implantation 11/14 by Dr. Donata Clay.  Follow up for Heart Failure/LVAD:   Continues to have ongoing dizziness as well as shortness of breath. His weight fluctuates greatly and he has been having >100 PI events daily. While talking with the patient he continued to drop down to his low speed on the monitor. He had briefly increased his dosing but now back to 40mg  daily. Still has some ongoing swelling as well. No bleeding, melena or neuro symptoms. No VAD alarms. Taking all meds as prescribed.    VAD Indication: Destination therapy currently - Seen by [facility] for transplant. Transplant team advised for VAD. Once pt lost 25lbs they would reconsider for transplant.   LVAD assessment: HM III : Speed: 6200 rpms Flow: 6.4 Power: 5.5 w PI: 3.3 Alarms: no clinical alarms  Events: 100+ PI events  Fixed speed: 6200 Low speed limit: 5900   Primary controller: back up battery due for replacement in 28 months Secondary controller:  back up battery due for replacement in 28 months   I reviewed the LVAD parameters from today and compared the results to the patient's prior recorded data. LVAD interrogation was NEGATIVE for significant power changes, NEGATIVE for clinical alarms and worsening for PI events/speed drops. VAD speed was decreased to 5800 with improvement in speed drop events and stable PI. Concern for worsening RV dysfunction. aThe pump is functioning within specified parameters. Pt is performing daily controller and system monitor self tests along with completing weekly and monthly maintenance for LVAD equipment.   LVAD equipment check completed and is in good working order. Back-up equipment present. Charged back  up battery and performed self-test on equipment.    Annual Equipment Maintenance on UBC/PM was performed on 05/03/2023.   Past Medical History:  Diagnosis Date   Abnormal EKG    Acute systolic heart failure (HCC) 09/06/2017   Angina pectoris (HCC) 09/05/2017   Body mass index 45.0-49.9, adult (HCC)    CAD S/P percutaneous coronary angioplasty 09/07/2017   mLAD and dLAD PCI with DES 09/06/17   CHF (congestive heart failure) (HCC)    Chronic systolic (congestive) heart failure (HCC) 03/19/2019   Complication of anesthesia 1995   "had hard time waking up; breathing w/vasectomy"   Coronary artery disease    Erectile dysfunction    Essential hypertension, benign    Fatigue    GERD (gastroesophageal reflux disease)    Gout    High cholesterol    "just started tx yesterday" (09/06/2017)   History of gout    "RX prn" (09/06/2017)   Ischemic cardiomyopathy 09/07/2017   EF 25-35%   Muscular chest pain    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   Below Left Nare (MOH's)   Obesity    Obstructive sleep apnea    OSA on CPAP    Plantar fasciitis    Pre-operative clearance 02/11/2018   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/02/2019   Saint Jude   Right bundle branch block    Shortness of breath 09/05/2017   Testosterone deficiency    Type 2 diabetes mellitus with complication, without long-term current use of insulin (HCC) 09/05/2017   Type II diabetes  mellitus (HCC)    "started tx 08/28/2017"    Current Outpatient Medications  Medication Sig Dispense Refill   ondansetron (ZOFRAN-ODT) 4 MG disintegrating tablet Take 1 tablet (4 mg total) by mouth every 8 (eight) hours as needed for nausea or vomiting. 20 tablet 0   acetaminophen (TYLENOL) 500 MG tablet Take 1,000 mg by mouth every 6 (six) hours as needed for moderate pain or headache.     allopurinol (ZYLOPRIM) 100 MG tablet Take 100 mg by mouth in the morning.     amiodarone (PACERONE) 200 MG tablet Take  1 tablet (200 mg total) by mouth daily. 30 tablet 5   colchicine 0.6 MG tablet Take 0.6 mg by mouth daily as needed (Gout).     diclofenac Sodium (VOLTAREN) 1 % GEL APPLY 1 G TOPICALLY DAILY. (Patient taking differently: Apply 1 g topically 4 (four) times daily as needed (pain.).) 100 g 0   digoxin (LANOXIN) 0.125 MG tablet Take 1 tablet (0.125 mg total) by mouth daily. 30 tablet 5   escitalopram (LEXAPRO) 10 MG tablet Take 10 mg by mouth at bedtime.     ezetimibe (ZETIA) 10 MG tablet Take 1 tablet (10 mg total) by mouth daily. 30 tablet 5   gabapentin (NEURONTIN) 300 MG capsule Take 300 mg by mouth 3 (three) times daily.     levothyroxine (SYNTHROID) 25 MCG tablet Take 25 mcg by mouth daily before breakfast.     metFORMIN (GLUCOPHAGE-XR) 500 MG 24 hr tablet Take 1,000 mg by mouth in the morning and at bedtime.     mexiletine (MEXITIL) 250 MG capsule Take 1 capsule (250 mg total) by mouth 2 (two) times daily. 60 capsule 11   multivitamin (ONE-A-DAY MEN'S) TABS tablet Take 1 tablet by mouth in the morning.     omeprazole (PRILOSEC) 40 MG capsule Take 1 capsule (40 mg total) by mouth daily. 90 capsule 1   OZEMPIC, 2 MG/DOSE, 8 MG/3ML SOPN Inject 2 mg into the skin every Monday.     Polyethylene Glycol 400 (BLINK TEARS OP) Place 2 drops into both eyes 4 (four) times daily as needed (for dry eyes).     potassium chloride SA (KLOR-CON M) 20 MEQ tablet Take 3 tablets (60 mEq total) by mouth 2 (two) times daily. 540 tablet 1   rosuvastatin (CRESTOR) 40 MG tablet Take 1 tablet (40 mg total) by mouth daily. 90 tablet 2   sildenafil (REVATIO) 20 MG tablet Take 1 tablet (20 mg total) by mouth 3 (three) times daily. 90 tablet 5   spironolactone (ALDACTONE) 25 MG tablet Take 1 tablet (25 mg total) by mouth daily. 30 tablet 5   torsemide (DEMADEX) 20 MG tablet TAKE 2 TABLETS (40 MG TOTAL) BY MOUTH DAILY. 180 tablet 2   warfarin (COUMADIN) 5 MG tablet Take 1.5 tablets (7.5 mg total) by mouth daily. (Patient  taking differently: Take 7.5 mg by mouth every evening.) 60 tablet 5   No current facility-administered medications for this encounter.      Vitals:   08/03/23 1258  BP: 98/84     Vital Signs:  Doppler Pressure: 94 Automatic BP: UTO HR:  75 NSR SPO2: 95 %   Weight: 319 lb w/ eqt Last weight: 314.2 lb w/ eqt BMI today 39.87 today   Physical Exam: General:  NAD.  HEENT: normal  Neck: supple. JVP not elevated.  Carotids 2+ bilat; no bruits. No lymphadenopathy or thryomegaly appreciated. Cor: LVAD hum.  Lungs: Clear. Abdomen: obese soft, nontender, non-distended.  No hepatosplenomegaly. No bruits or masses. Good bowel sounds. Driveline site clean. Anchor in place.  Extremities: no cyanosis, clubbing, rash. Warm 1+ edema  Neuro: alert & oriented x 3. No focal deficits. Moves all 4 without problem   ASSESSMENT AND PLAN:    1.  Chronic Systolic HF s/p HM3 Implantation 11/14 - due to iCM - Echo 05/01/23: EF 20%, RV moderately down (improved) - RHC (9/24): elevated biventricular filling pressures with low PAPi; CI 2.2 - S/p HM III LVAD 05/03/23 by Dr. Maren Beach - RAMP echo 11/21: Speeds left at 6000.  Speed increased to 6100 on 11/27. Speed increased to 6200 on 05/18/23.  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  - NYHA II-III - Symptoms concerning for RV dysfunction. Unfortunately unable to obtain echo images (similar to post operative) so will plan for invasive ramp study on Monday - Decreased speed to 5800 RPM to hopefully improve septal geometry - Continue compression hose - Continue spiro 25 - Has been seen at [facility] and will consider for transplant once BMI down   2. LVAD management - s/p HM-3 implant 05/03/23 - VAD interrogated personally. Parameters stable. - INR 3.0 Goal 2.0-3.0 Discussed warfarin dosing with PharmD personally. - LDH 216 - MAPs ok  - DL looks good. Has 1 more week  of po abx   3. H/o VT Arrest  Hx PVCs - had VT arrest x 2 in 10/24 -  Continue Amio 200 mg bid and Mexiletine 250 mg bid. No change   4. CAD with chronic stable angina - s/p multiple stents to LAD and LCX - LHC (9/24) with stable 3v CAD -  No s/s angina    5. DM2 - A1c 7.5 (10/24) - Per PCP   6. OSA - Not using CPAP, says machine was recalled a while ago. Needs new machine. - Followed outpatient   7. Morbid obesity - There is no height or weight on file to calculate BMI. - Continue Ozempic - Understands need to get BMI down near 35 for transplant  I spent a total of 51 minutes today: 1) reviewing the patient's medical records including previous charts, labs and recent notes from other providers; 2) examining the patient and counseling them on their medical issues/explaining the plan of care; 3) adjusting meds as needed and 4) ordering lab work or other needed tests.     [name], MD  5:58 PM

## 2023-08-05 NOTE — Addendum Note (Signed)
Encounter addended by: Dolores Patty, MD on: 08/05/2023 11:50 AM  Actions taken: Clinical Note Signed, Level of Service modified, Visit diagnoses modified, Charge Capture section accepted

## 2023-08-05 NOTE — H&P (View-Only) (Signed)
LVAD CLINIC NOTE  PCP: Primary Cardiologist: [name], MD HF MD: DB   Chief complaint: HF  HPI:  Joe [name] is a 53 y.o. male with h/o morbid obesity, DM2, HTN, HL, OSA, CAD s/p multiple stents to LAD and LCX  and systolic HF s/[ Abbott CRT-D. Now s/p HM3 LVAD Implantation 11/14 by Dr. Donata Clay.  Follow up for Heart Failure/LVAD:   Continues to have ongoing dizziness as well as shortness of breath. His weight fluctuates greatly and he has been having >100 PI events daily. While talking with the patient he continued to drop down to his low speed on the monitor. He had briefly increased his dosing but now back to 40mg  daily. Still has some ongoing swelling as well. No bleeding, melena or neuro symptoms. No VAD alarms. Taking all meds as prescribed.    VAD Indication: Destination therapy currently - Seen by [facility] for transplant. Transplant team advised for VAD. Once pt lost 25lbs they would reconsider for transplant.   LVAD assessment: HM III : Speed: 6200 rpms Flow: 6.4 Power: 5.5 w PI: 3.3 Alarms: no clinical alarms  Events: 100+ PI events  Fixed speed: 6200 Low speed limit: 5900   Primary controller: back up battery due for replacement in 28 months Secondary controller:  back up battery due for replacement in 28 months   I reviewed the LVAD parameters from today and compared the results to the patient's prior recorded data. LVAD interrogation was NEGATIVE for significant power changes, NEGATIVE for clinical alarms and worsening for PI events/speed drops. VAD speed was decreased to 5800 with improvement in speed drop events and stable PI. Concern for worsening RV dysfunction. aThe pump is functioning within specified parameters. Pt is performing daily controller and system monitor self tests along with completing weekly and monthly maintenance for LVAD equipment.   LVAD equipment check completed and is in good working order. Back-up equipment present. Charged back  up battery and performed self-test on equipment.    Annual Equipment Maintenance on UBC/PM was performed on 05/03/2023.   Past Medical History:  Diagnosis Date   Abnormal EKG    Acute systolic heart failure (HCC) 09/06/2017   Angina pectoris (HCC) 09/05/2017   Body mass index 45.0-49.9, adult (HCC)    CAD S/P percutaneous coronary angioplasty 09/07/2017   mLAD and dLAD PCI with DES 09/06/17   CHF (congestive heart failure) (HCC)    Chronic systolic (congestive) heart failure (HCC) 03/19/2019   Complication of anesthesia 1995   "had hard time waking up; breathing w/vasectomy"   Coronary artery disease    Erectile dysfunction    Essential hypertension, benign    Fatigue    GERD (gastroesophageal reflux disease)    Gout    High cholesterol    "just started tx yesterday" (09/06/2017)   History of gout    "RX prn" (09/06/2017)   Ischemic cardiomyopathy 09/07/2017   EF 25-35%   Muscular chest pain    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   Below Left Nare (MOH's)   Obesity    Obstructive sleep apnea    OSA on CPAP    Plantar fasciitis    Pre-operative clearance 02/11/2018   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/02/2019   Saint Jude   Right bundle branch block    Shortness of breath 09/05/2017   Testosterone deficiency    Type 2 diabetes mellitus with complication, without long-term current use of insulin (HCC) 09/05/2017   Type II diabetes  mellitus (HCC)    "started tx 08/28/2017"    Current Outpatient Medications  Medication Sig Dispense Refill   ondansetron (ZOFRAN-ODT) 4 MG disintegrating tablet Take 1 tablet (4 mg total) by mouth every 8 (eight) hours as needed for nausea or vomiting. 20 tablet 0   acetaminophen (TYLENOL) 500 MG tablet Take 1,000 mg by mouth every 6 (six) hours as needed for moderate pain or headache.     allopurinol (ZYLOPRIM) 100 MG tablet Take 100 mg by mouth in the morning.     amiodarone (PACERONE) 200 MG tablet Take  1 tablet (200 mg total) by mouth daily. 30 tablet 5   colchicine 0.6 MG tablet Take 0.6 mg by mouth daily as needed (Gout).     diclofenac Sodium (VOLTAREN) 1 % GEL APPLY 1 G TOPICALLY DAILY. (Patient taking differently: Apply 1 g topically 4 (four) times daily as needed (pain.).) 100 g 0   digoxin (LANOXIN) 0.125 MG tablet Take 1 tablet (0.125 mg total) by mouth daily. 30 tablet 5   escitalopram (LEXAPRO) 10 MG tablet Take 10 mg by mouth at bedtime.     ezetimibe (ZETIA) 10 MG tablet Take 1 tablet (10 mg total) by mouth daily. 30 tablet 5   gabapentin (NEURONTIN) 300 MG capsule Take 300 mg by mouth 3 (three) times daily.     levothyroxine (SYNTHROID) 25 MCG tablet Take 25 mcg by mouth daily before breakfast.     metFORMIN (GLUCOPHAGE-XR) 500 MG 24 hr tablet Take 1,000 mg by mouth in the morning and at bedtime.     mexiletine (MEXITIL) 250 MG capsule Take 1 capsule (250 mg total) by mouth 2 (two) times daily. 60 capsule 11   multivitamin (ONE-A-DAY MEN'S) TABS tablet Take 1 tablet by mouth in the morning.     omeprazole (PRILOSEC) 40 MG capsule Take 1 capsule (40 mg total) by mouth daily. 90 capsule 1   OZEMPIC, 2 MG/DOSE, 8 MG/3ML SOPN Inject 2 mg into the skin every Monday.     Polyethylene Glycol 400 (BLINK TEARS OP) Place 2 drops into both eyes 4 (four) times daily as needed (for dry eyes).     potassium chloride SA (KLOR-CON M) 20 MEQ tablet Take 3 tablets (60 mEq total) by mouth 2 (two) times daily. 540 tablet 1   rosuvastatin (CRESTOR) 40 MG tablet Take 1 tablet (40 mg total) by mouth daily. 90 tablet 2   sildenafil (REVATIO) 20 MG tablet Take 1 tablet (20 mg total) by mouth 3 (three) times daily. 90 tablet 5   spironolactone (ALDACTONE) 25 MG tablet Take 1 tablet (25 mg total) by mouth daily. 30 tablet 5   torsemide (DEMADEX) 20 MG tablet TAKE 2 TABLETS (40 MG TOTAL) BY MOUTH DAILY. 180 tablet 2   warfarin (COUMADIN) 5 MG tablet Take 1.5 tablets (7.5 mg total) by mouth daily. (Patient  taking differently: Take 7.5 mg by mouth every evening.) 60 tablet 5   No current facility-administered medications for this encounter.      Vitals:   08/03/23 1258  BP: 98/84     Vital Signs:  Doppler Pressure: 94 Automatic BP: UTO HR:  75 NSR SPO2: 95 %   Weight: 319 lb w/ eqt Last weight: 314.2 lb w/ eqt BMI today 39.87 today   Physical Exam: General:  NAD.  HEENT: normal  Neck: supple. JVP not elevated.  Carotids 2+ bilat; no bruits. No lymphadenopathy or thryomegaly appreciated. Cor: LVAD hum.  Lungs: Clear. Abdomen: obese soft, nontender, non-distended.  No hepatosplenomegaly. No bruits or masses. Good bowel sounds. Driveline site clean. Anchor in place.  Extremities: no cyanosis, clubbing, rash. Warm 1+ edema  Neuro: alert & oriented x 3. No focal deficits. Moves all 4 without problem   ASSESSMENT AND PLAN:    1.  Chronic Systolic HF s/p HM3 Implantation 11/14 - due to iCM - Echo 05/01/23: EF 20%, RV moderately down (improved) - RHC (9/24): elevated biventricular filling pressures with low PAPi; CI 2.2 - S/p HM III LVAD 05/03/23 by Dr. Maren Beach - RAMP echo 11/21: Speeds left at 6000.  Speed increased to 6100 on 11/27. Speed increased to 6200 on 05/18/23.  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  - NYHA II-III - Symptoms concerning for RV dysfunction. Unfortunately unable to obtain echo images (similar to post operative) so will plan for invasive ramp study on Monday - Decreased speed to 5800 RPM to hopefully improve septal geometry - Continue compression hose - Continue spiro 25 - Has been seen at [facility] and will consider for transplant once BMI down   2. LVAD management - s/p HM-3 implant 05/03/23 - VAD interrogated personally. Parameters stable. - INR 3.0 Goal 2.0-3.0 Discussed warfarin dosing with PharmD personally. - LDH 216 - MAPs ok  - DL looks good. Has 1 more week  of po abx   3. H/o VT Arrest  Hx PVCs - had VT arrest x 2 in 10/24 -  Continue Amio 200 mg bid and Mexiletine 250 mg bid. No change   4. CAD with chronic stable angina - s/p multiple stents to LAD and LCX - LHC (9/24) with stable 3v CAD -  No s/s angina    5. DM2 - A1c 7.5 (10/24) - Per PCP   6. OSA - Not using CPAP, says machine was recalled a while ago. Needs new machine. - Followed outpatient   7. Morbid obesity - There is no height or weight on file to calculate BMI. - Continue Ozempic - Understands need to get BMI down near 35 for transplant  I spent a total of 51 minutes today: 1) reviewing the patient's medical records including previous charts, labs and recent notes from other providers; 2) examining the patient and counseling them on their medical issues/explaining the plan of care; 3) adjusting meds as needed and 4) ordering lab work or other needed tests.     [name], MD  5:58 PM

## 2023-08-05 NOTE — Progress Notes (Signed)
LVAD CLINIC NOTE  PCP: Primary Cardiologist: [name], MD HF MD: DB   Chief complaint: HF  HPI:  Joe [name] is a 53 y.o. male with h/o morbid obesity, DM2, HTN, HL, OSA, CAD s/p multiple stents to LAD and LCX  and systolic HF s/[ Abbott CRT-D. Now s/p HM3 LVAD Implantation 11/14 by Dr. Donata Clay.  Follow up for Heart Failure/LVAD:   Continues to struggle with diuretic dosing. Now on torsemide 40 daily. Still with a little bit of swelling but got dizzy and had a fall last week. Had CXR which was ok. As 1 more week of po abx. Denies orthopnea or PND. No fevers, chills or problems with driveline. No bleeding, melena or neuro symptoms. No VAD alarms. Taking all meds as prescribed.    VAD Indication: Destination therapy - Seen by [facility] for transplant. Transplant team advised for VAD. Once pt lost 25lbs they would reconsider for transplant.   LVAD assessment: HM III : Speed: 6200 rpms Flow: 6.2 Power: 5.5 w PI: 3.4 Alarms: no clinical alarms  Events: 5-20 PI events  Fixed speed: 6200 Low speed limit: 5900   Primary controller: back up battery due for replacement in 28 months Secondary controller:  back up battery due for replacement in 28 months   I reviewed the LVAD parameters from today and compared the results to the patient's prior recorded data. LVAD interrogation was NEGATIVE for significant power changes, NEGATIVE for clinical alarms and STABLE for PI events/speed drops. No programming changes were made and pump is functioning within specified parameters. Pt is performing daily controller and system monitor self tests along with completing weekly and monthly maintenance for LVAD equipment.   LVAD equipment check completed and is in good working order. Back-up equipment present. Charged back up battery and performed self-test on equipment.    Annual Equipment Maintenance on UBC/PM was performed on 05/03/2023.   Past Medical History:  Diagnosis Date    Abnormal EKG    Acute systolic heart failure (HCC) 09/06/2017   Angina pectoris (HCC) 09/05/2017   Body mass index 45.0-49.9, adult (HCC)    CAD S/P percutaneous coronary angioplasty 09/07/2017   mLAD and dLAD PCI with DES 09/06/17   CHF (congestive heart failure) (HCC)    Chronic systolic (congestive) heart failure (HCC) 03/19/2019   Complication of anesthesia 1995   "had hard time waking up; breathing w/vasectomy"   Coronary artery disease    Erectile dysfunction    Essential hypertension, benign    Fatigue    GERD (gastroesophageal reflux disease)    Gout    High cholesterol    "just started tx yesterday" (09/06/2017)   History of gout    "RX prn" (09/06/2017)   Ischemic cardiomyopathy 09/07/2017   EF 25-35%   Muscular chest pain    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   Below Left Nare (MOH's)   Obesity    Obstructive sleep apnea    OSA on CPAP    Plantar fasciitis    Pre-operative clearance 02/11/2018   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/02/2019   Saint Jude   Right bundle branch block    Shortness of breath 09/05/2017   Testosterone deficiency    Type 2 diabetes mellitus with complication, without long-term current use of insulin (HCC) 09/05/2017   Type II diabetes mellitus (HCC)    "started tx 08/28/2017"    Current Outpatient Medications  Medication Sig Dispense Refill   acetaminophen (TYLENOL) 500 MG tablet  Take 1,000 mg by mouth every 6 (six) hours as needed for moderate pain or headache.     allopurinol (ZYLOPRIM) 100 MG tablet Take 100 mg by mouth in the morning.     diclofenac Sodium (VOLTAREN) 1 % GEL APPLY 1 G TOPICALLY DAILY. (Patient taking differently: Apply 1 g topically 4 (four) times daily as needed (pain.).) 100 g 0   digoxin (LANOXIN) 0.125 MG tablet Take 1 tablet (0.125 mg total) by mouth daily. 30 tablet 5   escitalopram (LEXAPRO) 10 MG tablet Take 10 mg by mouth at bedtime.     gabapentin (NEURONTIN) 300 MG  capsule Take 300 mg by mouth 3 (three) times daily.     levothyroxine (SYNTHROID) 25 MCG tablet Take 25 mcg by mouth daily before breakfast.     mexiletine (MEXITIL) 250 MG capsule Take 1 capsule (250 mg total) by mouth 2 (two) times daily. 60 capsule 11   multivitamin (ONE-A-DAY MEN'S) TABS tablet Take 1 tablet by mouth in the morning.     omeprazole (PRILOSEC) 40 MG capsule Take 1 capsule (40 mg total) by mouth daily. 90 capsule 1   Polyethylene Glycol 400 (BLINK TEARS OP) Place 2 drops into both eyes 4 (four) times daily as needed (for dry eyes).     potassium chloride SA (KLOR-CON M) 20 MEQ tablet Take 3 tablets (60 mEq total) by mouth 2 (two) times daily. 540 tablet 1   rosuvastatin (CRESTOR) 40 MG tablet Take 1 tablet (40 mg total) by mouth daily. 90 tablet 2   sildenafil (REVATIO) 20 MG tablet Take 1 tablet (20 mg total) by mouth 3 (three) times daily. 90 tablet 5   spironolactone (ALDACTONE) 25 MG tablet Take 1 tablet (25 mg total) by mouth daily. 30 tablet 5   torsemide (DEMADEX) 20 MG tablet TAKE 2 TABLETS (40 MG TOTAL) BY MOUTH DAILY. 180 tablet 2   warfarin (COUMADIN) 5 MG tablet Take 1.5 tablets (7.5 mg total) by mouth daily. (Patient taking differently: Take 7.5 mg by mouth every evening.) 60 tablet 5   amiodarone (PACERONE) 200 MG tablet Take 1 tablet (200 mg total) by mouth daily. 30 tablet 5   colchicine 0.6 MG tablet Take 0.6 mg by mouth daily as needed (Gout).     ezetimibe (ZETIA) 10 MG tablet Take 1 tablet (10 mg total) by mouth daily. 30 tablet 5   metFORMIN (GLUCOPHAGE-XR) 500 MG 24 hr tablet Take 1,000 mg by mouth in the morning and at bedtime.     ondansetron (ZOFRAN-ODT) 4 MG disintegrating tablet Take 1 tablet (4 mg total) by mouth every 8 (eight) hours as needed for nausea or vomiting. 20 tablet 0   OZEMPIC, 2 MG/DOSE, 8 MG/3ML SOPN Inject 2 mg into the skin every Monday.     No current facility-administered medications for this encounter.      Vitals:   07/19/23  1300 07/19/23 1301  BP: (!) 98/0 (!) 80/65  Pulse:  75  SpO2:  95%  Weight:  (!) 144.7 kg (319 lb)  Height:  6\' 3"  (1.905 m)     Vital Signs:  Doppler Pressure: 94 Automatic BP: UTO HR:  75 NSR SPO2: 95 %   Weight: 319 lb w/ eqt Last weight: 314.2 lb w/ eqt BMI today 39.87 today   Physical Exam: General:  NAD.  HEENT: normal  Neck: supple. JVP not elevated.  Carotids 2+ bilat; no bruits. No lymphadenopathy or thryomegaly appreciated. Cor: LVAD hum.  Lungs: Clear. Abdomen: obese soft,  nontender, non-distended. No hepatosplenomegaly. No bruits or masses. Good bowel sounds. Driveline site clean. Anchor in place.  Extremities: no cyanosis, clubbing, rash. Warm 1+ edema  Neuro: alert & oriented x 3. No focal deficits. Moves all 4 without problem   ASSESSMENT AND PLAN:    1.  Chronic Systolic HF s/p HM3 Implantation 11/14 - due to iCM - Echo 05/01/23: EF 20%, RV moderately down (improved) - RHC (9/24): elevated biventricular filling pressures with low PAPi; CI 2.2 - S/p HM III LVAD 05/03/23 by Dr. Maren Beach - RAMP echo 11/21: Speeds left at 6000.  Speed increased to 6100 on 11/27. Speed increased to 6200 on 05/18/23.  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  - NYHA I-II - Continues to struggle with fluid balance. Continue torsemide 40 daiy + extra as needed. If not improving will neef=d0 - Continue compression hose - Continue spiro 25 - Has been seen at [facility] and will consider for transplant once BMI down Body mass index is 39.87 kg/m.  2. LVAD management - s/p HM-3 implant 05/03/23 - VAD interrogated personally. Parameters stable. - INR 3.0 Goal 2.0-3.0 Discussed warfarin dosing with PharmD personally. - LDH 216 - MAPs ok  - DL looks good. Has 1 more week  of po abx   3. H/o VT Arrest  Hx PVCs - had VT arrest x 2 in 10/24 - Continue Amio 200 mg bid and Mexiletine 250 mg bid. No change   4. CAD with chronic stable angina - s/p multiple stents to LAD and  LCX - LHC (9/24) with stable 3v CAD -  No s/s angina    5. DM2 - A1c 7.5 (10/24) - Per PCP   6. OSA - Not using CPAP, says machine was recalled a while ago. Needs new machine. - Followed outpatient   7. Morbid obesity - Body mass index is 39.87 kg/m. - Continue Ozempic - Understands need to get BMI down near 35 for transplant  I spent a total of 41 minutes today: 1) reviewing the patient's medical records including previous charts, labs and recent notes from other providers; 2) examining the patient and counseling them on their medical issues/explaining the plan of care; 3) adjusting meds as needed and 4) ordering lab work or other needed tests.     Arvilla Meres, MD  11:39 AM

## 2023-08-06 ENCOUNTER — Encounter (HOSPITAL_COMMUNITY)
Admission: RE | Disposition: A | Discharge status: Home / Self Care | Payer: Self-pay | Source: Ambulatory Visit | Attending: Internal Medicine

## 2023-08-06 ENCOUNTER — Ambulatory Visit (HOSPITAL_COMMUNITY)
Admission: RE | Admit: 2023-08-06 | Discharge: 2023-08-06 | Disposition: A | Discharge status: Home / Self Care | Payer: 59 | Source: Ambulatory Visit | Attending: Internal Medicine | Admitting: Internal Medicine

## 2023-08-06 ENCOUNTER — Ambulatory Visit (HOSPITAL_COMMUNITY): Payer: Self-pay | Admitting: Pharmacist

## 2023-08-06 ENCOUNTER — Other Ambulatory Visit: Payer: Self-pay

## 2023-08-06 ENCOUNTER — Encounter (HOSPITAL_COMMUNITY): Payer: Self-pay | Admitting: Internal Medicine

## 2023-08-06 DIAGNOSIS — Z6841 Body Mass Index (BMI) 40.0 and over, adult: Secondary | ICD-10-CM | POA: Insufficient documentation

## 2023-08-06 DIAGNOSIS — Z95810 Presence of automatic (implantable) cardiac defibrillator: Secondary | ICD-10-CM | POA: Insufficient documentation

## 2023-08-06 DIAGNOSIS — I110 Hypertensive heart disease with heart failure: Secondary | ICD-10-CM | POA: Diagnosis present

## 2023-08-06 DIAGNOSIS — I25118 Atherosclerotic heart disease of native coronary artery with other forms of angina pectoris: Secondary | ICD-10-CM | POA: Insufficient documentation

## 2023-08-06 DIAGNOSIS — Z8674 Personal history of sudden cardiac arrest: Secondary | ICD-10-CM | POA: Diagnosis not present

## 2023-08-06 DIAGNOSIS — Z7984 Long term (current) use of oral hypoglycemic drugs: Secondary | ICD-10-CM | POA: Insufficient documentation

## 2023-08-06 DIAGNOSIS — Z7985 Long-term (current) use of injectable non-insulin antidiabetic drugs: Secondary | ICD-10-CM | POA: Diagnosis not present

## 2023-08-06 DIAGNOSIS — I5022 Chronic systolic (congestive) heart failure: Secondary | ICD-10-CM | POA: Insufficient documentation

## 2023-08-06 DIAGNOSIS — Z955 Presence of coronary angioplasty implant and graft: Secondary | ICD-10-CM | POA: Diagnosis not present

## 2023-08-06 DIAGNOSIS — Z79899 Other long term (current) drug therapy: Secondary | ICD-10-CM | POA: Insufficient documentation

## 2023-08-06 DIAGNOSIS — Z95818 Presence of other cardiac implants and grafts: Secondary | ICD-10-CM | POA: Diagnosis not present

## 2023-08-06 DIAGNOSIS — E119 Type 2 diabetes mellitus without complications: Secondary | ICD-10-CM | POA: Insufficient documentation

## 2023-08-06 DIAGNOSIS — G4733 Obstructive sleep apnea (adult) (pediatric): Secondary | ICD-10-CM | POA: Diagnosis not present

## 2023-08-06 DIAGNOSIS — I5023 Acute on chronic systolic (congestive) heart failure: Secondary | ICD-10-CM

## 2023-08-06 HISTORY — PX: RIGHT HEART CATH: CATH118263

## 2023-08-06 LAB — POCT I-STAT EG7
Acid-Base Excess: 4 mmol/L — ABNORMAL HIGH (ref 0.0–2.0)
Bicarbonate: 29.7 mmol/L — ABNORMAL HIGH (ref 20.0–28.0)
Calcium, Ion: 1.06 mmol/L — ABNORMAL LOW (ref 1.15–1.40)
HCT: 36 % — ABNORMAL LOW (ref 39.0–52.0)
Hemoglobin: 12.2 g/dL — ABNORMAL LOW (ref 13.0–17.0)
O2 Saturation: 61 %
Potassium: 3.7 mmol/L (ref 3.5–5.1)
Sodium: 139 mmol/L (ref 135–145)
TCO2: 31 mmol/L (ref 22–32)
pCO2, Ven: 47.3 mm[Hg] (ref 44–60)
pH, Ven: 7.407 (ref 7.25–7.43)
pO2, Ven: 32 mm[Hg] (ref 32–45)

## 2023-08-06 LAB — PROTIME-INR
INR: 1.7 — ABNORMAL HIGH (ref 0.8–1.2)
Prothrombin Time: 20.1 s — ABNORMAL HIGH (ref 11.4–15.2)

## 2023-08-06 LAB — GLUCOSE, CAPILLARY: Glucose-Capillary: 173 mg/dL — ABNORMAL HIGH (ref 70–99)

## 2023-08-06 LAB — POCT ACTIVATED CLOTTING TIME: Activated Clotting Time: 135 s

## 2023-08-06 SURGERY — RIGHT HEART CATH
Anesthesia: LOCAL

## 2023-08-06 MED ORDER — HEPARIN (PORCINE) IN NACL 1000-0.9 UT/500ML-% IV SOLN
INTRAVENOUS | Status: DC | PRN
  Administered 2023-08-06: 500 mL

## 2023-08-06 MED ORDER — SODIUM CHLORIDE 0.9% FLUSH: 3.0000 mL | Freq: Two times a day (BID) | INTRAVENOUS | Status: DC

## 2023-08-06 MED ORDER — SODIUM CHLORIDE 0.9 % IV SOLN: INTRAVENOUS | Status: DC

## 2023-08-06 MED ORDER — ACETAMINOPHEN 325 MG PO TABS: 650.0000 mg | ORAL_TABLET | ORAL | Status: DC | PRN

## 2023-08-06 MED ORDER — HYDRALAZINE HCL 20 MG/ML IJ SOLN: 10.0000 mg | INTRAMUSCULAR | Status: DC | PRN

## 2023-08-06 MED ORDER — LIDOCAINE HCL (PF) 1 % IJ SOLN
INTRAMUSCULAR | Status: AC
  Filled 2023-08-06: qty 30

## 2023-08-06 MED ORDER — SODIUM CHLORIDE 0.9 % IV SOLN: 250.0000 mL | INTRAVENOUS | Status: DC | PRN

## 2023-08-06 MED ORDER — SODIUM CHLORIDE 0.9% FLUSH: 3.0000 mL | INTRAVENOUS | Status: DC | PRN

## 2023-08-06 MED ORDER — LIDOCAINE HCL (PF) 1 % IJ SOLN
INTRAMUSCULAR | Status: DC | PRN
  Administered 2023-08-06: 2 mL

## 2023-08-06 SURGICAL SUPPLY — 4 items
CATH SWAN GANZ 7F STRAIGHT (CATHETERS) IMPLANT
GLIDESHEATH SLENDER 7FR .021G (SHEATH) IMPLANT
GUIDEWIRE .025 260CM (WIRE) IMPLANT
PACK CARDIAC CATHETERIZATION (CUSTOM PROCEDURE TRAY) ×1 IMPLANT

## 2023-08-06 NOTE — Interval H&P Note (Signed)
History and Physical Interval Note:  08/06/2023 2:54 PM  Joe [name]  has presented today for surgery, with the diagnosis of heart failure.  The various methods of treatment have been discussed with the patient and family. After consideration of risks, benefits and other options for treatment, the patient has consented to  Procedure(s): RIGHT HEART CATH (N/A) as a surgical intervention.  The patient's history has been reviewed, patient examined, no change in status, stable for surgery.  I have reviewed the patient's chart and labs.  Questions were answered to the patient's satisfaction.     Dayan Kreis

## 2023-08-06 NOTE — Progress Notes (Signed)
VAD Coordinator Procedure Note:   VAD Coordinator met patient in cath lab. Pt undergoing RHC per Dr. Gala Romney. Hemodynamics and VAD parameters monitored by myself and cath lab staff throughout the procedure. Blood pressures were obtained with automatic cuff on left arm.     Time: Doppler Auto  BP Flow PI Power Speed  Pre-procedure:  1530  108/89 (97) 5.3 3.0 4.7 5800                    Procedure start:  1545  91/77 (84) 5.4 3.1 4.6 5800   1600  103/79 (88) 5.3 3.0 4.5 5800                    Recovery Area: 1615  89/72 (79) 5.4 3.0 4.6 5800             Patient Disposition: Patient tolerated the procedure well. VAD Coordinator accompanied and remained with patient in recovery area.   Per Dr Gala Romney- pt dry weight 307 lbs. If weight 307 lb or less HOLD daily Torsemide. May take if >307 lb.   F/u in VAD clinic scheduled 08/21/23 at 9:00.  Alyce Pagan RN VAD Coordinator  Office: [phone]  24/7 Pager: [phone]

## 2023-08-06 NOTE — Discharge Instructions (Signed)

## 2023-08-08 LAB — POCT I-STAT EG7
Acid-Base Excess: 5 mmol/L — ABNORMAL HIGH (ref 0.0–2.0)
Bicarbonate: 30.8 mmol/L — ABNORMAL HIGH (ref 20.0–28.0)
Calcium, Ion: 1.13 mmol/L — ABNORMAL LOW (ref 1.15–1.40)
HCT: 38 % — ABNORMAL LOW (ref 39.0–52.0)
Hemoglobin: 12.9 g/dL — ABNORMAL LOW (ref 13.0–17.0)
O2 Saturation: 64 %
Potassium: 3.9 mmol/L (ref 3.5–5.1)
Sodium: 137 mmol/L (ref 135–145)
TCO2: 32 mmol/L (ref 22–32)
pCO2, Ven: 48.8 mm[Hg] (ref 44–60)
pH, Ven: 7.409 (ref 7.25–7.43)
pO2, Ven: 33 mm[Hg] (ref 32–45)

## 2023-08-09 ENCOUNTER — Ambulatory Visit (HOSPITAL_COMMUNITY): Payer: Self-pay | Admitting: Pharmacist

## 2023-08-09 LAB — POCT INR: INR: 2.1 (ref 2.0–3.0)

## 2023-08-14 ENCOUNTER — Ambulatory Visit (HOSPITAL_COMMUNITY): Payer: Self-pay | Admitting: Pharmacist

## 2023-08-14 LAB — POCT INR: INR: 3.7 — AB (ref 2.0–3.0)

## 2023-08-17 ENCOUNTER — Other Ambulatory Visit (HOSPITAL_COMMUNITY): Payer: Self-pay

## 2023-08-17 ENCOUNTER — Other Ambulatory Visit (HOSPITAL_COMMUNITY): Payer: 59

## 2023-08-17 DIAGNOSIS — Z7901 Long term (current) use of anticoagulants: Secondary | ICD-10-CM

## 2023-08-17 DIAGNOSIS — Z95811 Presence of heart assist device: Secondary | ICD-10-CM

## 2023-08-21 ENCOUNTER — Ambulatory Visit (HOSPITAL_COMMUNITY): Payer: Self-pay | Admitting: Pharmacist

## 2023-08-21 ENCOUNTER — Encounter (HOSPITAL_COMMUNITY): Payer: Self-pay | Admitting: Internal Medicine

## 2023-08-21 ENCOUNTER — Ambulatory Visit (HOSPITAL_COMMUNITY)
Admission: RE | Admit: 2023-08-21 | Discharge: 2023-08-21 | Disposition: A | Discharge status: Home / Self Care | Payer: 59 | Source: Ambulatory Visit | Attending: Internal Medicine | Admitting: Internal Medicine

## 2023-08-21 VITALS — BP 108/79 | HR 68 | Wt 323.6 lb

## 2023-08-21 DIAGNOSIS — E118 Type 2 diabetes mellitus with unspecified complications: Secondary | ICD-10-CM

## 2023-08-21 DIAGNOSIS — Z8674 Personal history of sudden cardiac arrest: Secondary | ICD-10-CM | POA: Insufficient documentation

## 2023-08-21 DIAGNOSIS — I2510 Atherosclerotic heart disease of native coronary artery without angina pectoris: Secondary | ICD-10-CM | POA: Diagnosis present

## 2023-08-21 DIAGNOSIS — I110 Hypertensive heart disease with heart failure: Secondary | ICD-10-CM | POA: Diagnosis present

## 2023-08-21 DIAGNOSIS — G4733 Obstructive sleep apnea (adult) (pediatric): Secondary | ICD-10-CM | POA: Insufficient documentation

## 2023-08-21 DIAGNOSIS — I5022 Chronic systolic (congestive) heart failure: Secondary | ICD-10-CM | POA: Insufficient documentation

## 2023-08-21 DIAGNOSIS — Z7901 Long term (current) use of anticoagulants: Secondary | ICD-10-CM | POA: Insufficient documentation

## 2023-08-21 DIAGNOSIS — Z95811 Presence of heart assist device: Secondary | ICD-10-CM | POA: Diagnosis not present

## 2023-08-21 DIAGNOSIS — Z79899 Other long term (current) drug therapy: Secondary | ICD-10-CM | POA: Insufficient documentation

## 2023-08-21 DIAGNOSIS — E119 Type 2 diabetes mellitus without complications: Secondary | ICD-10-CM | POA: Diagnosis not present

## 2023-08-21 DIAGNOSIS — Z4509 Encounter for adjustment and management of other cardiac device: Secondary | ICD-10-CM | POA: Diagnosis not present

## 2023-08-21 DIAGNOSIS — Z955 Presence of coronary angioplasty implant and graft: Secondary | ICD-10-CM | POA: Diagnosis not present

## 2023-08-21 DIAGNOSIS — N529 Male erectile dysfunction, unspecified: Secondary | ICD-10-CM | POA: Diagnosis not present

## 2023-08-21 DIAGNOSIS — I493 Ventricular premature depolarization: Secondary | ICD-10-CM | POA: Insufficient documentation

## 2023-08-21 DIAGNOSIS — I25118 Atherosclerotic heart disease of native coronary artery with other forms of angina pectoris: Secondary | ICD-10-CM | POA: Insufficient documentation

## 2023-08-21 DIAGNOSIS — E785 Hyperlipidemia, unspecified: Secondary | ICD-10-CM | POA: Insufficient documentation

## 2023-08-21 DIAGNOSIS — Z95810 Presence of automatic (implantable) cardiac defibrillator: Secondary | ICD-10-CM | POA: Diagnosis not present

## 2023-08-21 DIAGNOSIS — Z6841 Body Mass Index (BMI) 40.0 and over, adult: Secondary | ICD-10-CM | POA: Diagnosis not present

## 2023-08-21 LAB — CBC
HCT: 36.9 % — ABNORMAL LOW (ref 39.0–52.0)
Hemoglobin: 12.6 g/dL — ABNORMAL LOW (ref 13.0–17.0)
MCH: 31.3 pg (ref 26.0–34.0)
MCHC: 34.1 g/dL (ref 30.0–36.0)
MCV: 91.6 fL (ref 80.0–100.0)
Platelets: 199 10*3/uL (ref 150–400)
RBC: 4.03 MIL/uL — ABNORMAL LOW (ref 4.22–5.81)
RDW: 14.4 % (ref 11.5–15.5)
WBC: 5.8 10*3/uL (ref 4.0–10.5)
nRBC: 0 % (ref 0.0–0.2)

## 2023-08-21 LAB — LACTATE DEHYDROGENASE: LDH: 162 U/L (ref 98–192)

## 2023-08-21 LAB — BASIC METABOLIC PANEL
Anion gap: 12 (ref 5–15)
BUN: 14 mg/dL (ref 6–20)
CO2: 26 mmol/L (ref 22–32)
Calcium: 8.9 mg/dL (ref 8.9–10.3)
Chloride: 98 mmol/L (ref 98–111)
Creatinine, Ser: 1.36 mg/dL — ABNORMAL HIGH (ref 0.61–1.24)
GFR, Estimated: >60 mL/min (ref >60)
Glucose, Bld: 264 mg/dL — ABNORMAL HIGH (ref 70–99)
Potassium: 4.2 mmol/L (ref 3.5–5.1)
Sodium: 136 mmol/L (ref 135–145)

## 2023-08-21 LAB — PROTIME-INR
INR: 2.2 — ABNORMAL HIGH (ref 0.8–1.2)
Prothrombin Time: 24.4 s — ABNORMAL HIGH (ref 11.4–15.2)

## 2023-08-21 LAB — POCT INR: INR: 2.1 (ref 2.0–3.0)

## 2023-08-21 MED ORDER — SILDENAFIL CITRATE 50 MG PO TABS: 50.0000 mg | ORAL_TABLET | Freq: Every day | ORAL | 6 refills | Status: AC | PRN

## 2023-08-21 NOTE — Patient Instructions (Addendum)
 May take PRN Viagra Coumadin dosing per Lauren PharmD Return to clinic in 2 months Closest VAD center to [facility]

## 2023-08-21 NOTE — Progress Notes (Signed)
 Pt presents for 1 month f/u in VAD Clinic today with his wife. Reports no problems with VAD equipment or concerns with drive line.  Pt walked into VAD Clinic unassisted. Reports intermittent dizziness and lightheadedness, but this has improved over time. He is drinking 2L/day. Pt states that he has shortness of breath when he is up walking around. Cardiac rehab referral pending. Would benefit from starting this soon. His weight is up 4 lbs today. He denies falls and signs of bleeding. He has been taking Torsemide 40 mg daily with good urine output.   Tolerating speed 5800. Less PI events/speed drops noted with speed decrease. RHC 08/06/23- RV failure marginal. PAPI 2.0.   Completed course of Cefadroxil for drive line redness. Redness is resolved. Advanced to weekly dressing changes using weekly kit. Joe [name] observed dressing change today with Sorbaview dressing.   Discussed need for PRN Viagra with Dr Gala Romney. Order received for PRN Viagra 50 mg. Prescription sent to pt's pharmacy.   Pt discussed going on a cruise to the [place] at the beginning of April with Dr Gala Romney. Pt aware closest VAD center to where they are cruising in/out of is in [place]. Provided with closest VAD center information below.  Vital Signs:  Doppler Pressure: 98 Automatic BP: 108/79 (89) HR: 68 NSR SPO2: 96%   Weight: 323.6 lb w/ eqt Last weight: 319 lb w/ eqt BMI today 39.87 today   VAD Indication: Destination therapy - Seen by [facility] for transplant. Transplant team advised for VAD. Once pt lost 25lbs they would reconsider for transplant.   LVAD assessment: HM III : Speed: 5800 rpms Flow: 5.4 Power: 4.7 w    PI: 3.5 Alarms: few LV advisories Events: 5-10 PI events  Fixed speed: 5800 Low speed limit: 5500   Primary controller: back up battery due for replacement in 26 months Secondary controller:  back up battery due for replacement in 28 months   I reviewed the LVAD parameters from today and compared  the results to the patient's prior recorded data. LVAD interrogation was NEGATIVE for significant power changes, NEGATIVE for clinical alarms and STABLE for PI events/speed drops. No programming changes were made and pump is functioning within specified parameters. Pt is performing daily controller and system monitor self tests along with completing weekly and monthly maintenance for LVAD equipment.   LVAD equipment check completed and is in good working order. Back-up equipment present. Charged back up battery and performed self-test on equipment.    Annual Equipment Maintenance on UBC/PM was performed on 05/03/2023.  Exit Site Care: Dressing change performed weekly on Fridays using daily kit by pt's wife Joe [name] at home. Existing VAD dressing removed and site care performed using sterile technique. Drive line exit site cleaned with Chlora prep applicators x 2, rinsed with sterile saline and allowed to dry. Covered with Silverlon patch and Sorbaview dressing. Exit site is partially incorporated, the velour is fully implanted at exit site. No redness, drainage, foul odor or rash noted. Drive line anchor re-applied. Advance to weekly dressing changes using weekly kits. Pt given 8 weekly kits and 8 anchors. It appears the redness around his driveline is his skin pigment--all his other scars from surgery appear the same.  Significant Events on VAD Support:      Device: St Jude Therapies: ON VF 200 Last check: 04/16/23   BP & Labs:  MAP 98 - Doppler is reflecting Modified systolic   Hgb 12.6 - No S/S of bleeding. Specifically denies melena/BRBPR or nosebleeds.  LDH stable at 162 with established baseline of 190- 245. Denies tea-colored urine. No power elevations noted on interrogation.   Plan: May take PRN Viagra Coumadin dosing per Lauren PharmD Return to clinic in 2 months Closest VAD center to [facility] RN VAD Coordinator  Office: [phone]  24/7 Pager:  [phone]

## 2023-08-22 NOTE — Progress Notes (Signed)
 LVAD CLINIC NOTE  PCP: Primary Cardiologist: [name], MD HF MD: DB   Chief complaint: HF  HPI:  Joe [name] is a 53 y.o. male with h/o morbid obesity, DM2, HTN, HL, OSA, CAD s/p multiple stents to LAD and LCX  and systolic HF s/[ Abbott CRT-D. Now s/p HM3 LVAD Implantation 11/14 by Dr. Donata Clay.  Underwent RHC and Ramp Echo 08/06/23  LVAD speed 5800 Flow 5.4 L/min Rare PI events     RA = 5 RV = 27/5 PA = 21/9 (14) PCW = 4 Fick cardiac output/index = 5.9/2.2 TD CO/CI = 5.8/2.2 PVR = 1.7 WU Ao sat = 94% PA sat = 61%,64% PAPi = 2.4  Follow up for Heart Failure/LVAD: Here for post cath f/u with his wife. Doing pretty well. Overall feels better, Has gained some weight. Taking torsemide 40 daily. Does ll ADLs without problem. Denies orthopnea or PND. No fevers, chills or problems with driveline. No bleeding, melena or neuro symptoms. No VAD alarms. Taking all meds as prescribed.      VAD Indication: Destination therapy - Seen by [facility] for transplant. Transplant team advised for VAD. Once pt lost 25lbs they would reconsider for transplant.   LVAD assessment: HM III : Speed: 5800 rpms Flow: 5.4 Power: 4.7 w    PI: 3.5 Alarms: few LV advisories Events: 5-10 PI events  Fixed speed: 5800 Low speed limit: 5500   Primary controller: back up battery due for replacement in 26 months Secondary controller:  back up battery due for replacement in 28 months   I reviewed the LVAD parameters from today and compared the results to the patient's prior recorded data. LVAD interrogation was NEGATIVE for significant power changes, NEGATIVE for clinical alarms and STABLE for PI events/speed drops. No programming changes were made and pump is functioning within specified parameters. Pt is performing daily controller and system monitor self tests along with completing weekly and monthly maintenance for LVAD equipment.   LVAD equipment check completed and is in good working  order. Back-up equipment present. Charged back up battery and performed self-test on equipment.    Annual Equipment Maintenance on UBC/PM was performed on 05/03/2023.   Past Medical History:  Diagnosis Date   Abnormal EKG    Acute systolic heart failure (HCC) 09/06/2017   Angina pectoris (HCC) 09/05/2017   Body mass index 45.0-49.9, adult (HCC)    CAD S/P percutaneous coronary angioplasty 09/07/2017   mLAD and dLAD PCI with DES 09/06/17   CHF (congestive heart failure) (HCC)    Chronic systolic (congestive) heart failure (HCC) 03/19/2019   Complication of anesthesia 1995   "had hard time waking up; breathing w/vasectomy"   Coronary artery disease    Erectile dysfunction    Essential hypertension, benign    Fatigue    GERD (gastroesophageal reflux disease)    Gout    High cholesterol    "just started tx yesterday" (09/06/2017)   History of gout    "RX prn" (09/06/2017)   Ischemic cardiomyopathy 09/07/2017   EF 25-35%   Muscular chest pain    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   Below Left Nare (MOH's)   Obesity    Obstructive sleep apnea    OSA on CPAP    Plantar fasciitis    Pre-operative clearance 02/11/2018   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/02/2019   [employer]   Right bundle branch block    Shortness of breath 09/05/2017   Testosterone deficiency  Type 2 diabetes mellitus with complication, without long-term current use of insulin (HCC) 09/05/2017   Type II diabetes mellitus (HCC)    "started tx 08/28/2017"    Current Outpatient Medications  Medication Sig Dispense Refill   acetaminophen (TYLENOL) 500 MG tablet Take 1,000 mg by mouth every 6 (six) hours as needed for moderate pain or headache.     allopurinol (ZYLOPRIM) 100 MG tablet Take 100 mg by mouth in the morning.     amiodarone (PACERONE) 200 MG tablet Take 1 tablet (200 mg total) by mouth daily. 30 tablet 5   colchicine 0.6 MG tablet Take 0.6 mg by mouth daily as  needed (Gout).     diclofenac Sodium (VOLTAREN) 1 % GEL APPLY 1 G TOPICALLY DAILY. (Patient taking differently: Apply 1 g topically 4 (four) times daily as needed (pain.).) 100 g 0   digoxin (LANOXIN) 0.125 MG tablet Take 1 tablet (0.125 mg total) by mouth daily. 30 tablet 5   escitalopram (LEXAPRO) 10 MG tablet Take 10 mg by mouth at bedtime.     ezetimibe (ZETIA) 10 MG tablet Take 1 tablet (10 mg total) by mouth daily. 30 tablet 5   gabapentin (NEURONTIN) 300 MG capsule Take 300 mg by mouth 3 (three) times daily.     levothyroxine (SYNTHROID) 25 MCG tablet Take 25 mcg by mouth daily before breakfast.     metFORMIN (GLUCOPHAGE-XR) 500 MG 24 hr tablet Take 1,000 mg by mouth in the morning and at bedtime.     mexiletine (MEXITIL) 250 MG capsule Take 1 capsule (250 mg total) by mouth 2 (two) times daily. 60 capsule 11   multivitamin (ONE-A-DAY MEN'S) TABS tablet Take 1 tablet by mouth in the morning.     omeprazole (PRILOSEC) 40 MG capsule Take 1 capsule (40 mg total) by mouth daily. 90 capsule 1   ondansetron (ZOFRAN-ODT) 4 MG disintegrating tablet Take 1 tablet (4 mg total) by mouth every 8 (eight) hours as needed for nausea or vomiting. 20 tablet 0   OZEMPIC, 2 MG/DOSE, 8 MG/3ML SOPN Inject 2 mg into the skin every Monday.     Polyethylene Glycol 400 (BLINK TEARS OP) Place 2 drops into both eyes 4 (four) times daily as needed (for dry eyes).     potassium chloride SA (KLOR-CON M) 20 MEQ tablet Take 3 tablets (60 mEq total) by mouth 2 (two) times daily. 540 tablet 1   rosuvastatin (CRESTOR) 40 MG tablet Take 1 tablet (40 mg total) by mouth daily. 90 tablet 2   sildenafil (REVATIO) 20 MG tablet Take 1 tablet (20 mg total) by mouth 3 (three) times daily. 90 tablet 5   sildenafil (VIAGRA) 50 MG tablet Take 1 tablet (50 mg total) by mouth daily as needed for erectile dysfunction. 10 tablet 6   spironolactone (ALDACTONE) 25 MG tablet Take 1 tablet (25 mg total) by mouth daily. 30 tablet 5   torsemide  (DEMADEX) 20 MG tablet TAKE 2 TABLETS (40 MG TOTAL) BY MOUTH DAILY. 180 tablet 2   warfarin (COUMADIN) 5 MG tablet Take 1.5 tablets (7.5 mg total) by mouth daily. (Patient taking differently: Take 7.5 mg by mouth every evening.) 60 tablet 5   No current facility-administered medications for this encounter.      Vitals:   08/21/23 0918 08/21/23 0923  BP: (!) 98/0 108/79  Pulse: 68   SpO2: 96%   Weight: (!) 146.8 kg (323 lb 9.6 oz)      Vital Signs:  Doppler Pressure: 98  Automatic BP: 108/79 (89) HR: 68 NSR SPO2: 96%   Weight: 323.6 lb w/ eqt Last weight: 319 lb w/ eqt BMI today 39.87 today   Physical Exam: General:  NAD.  HEENT: normal  Neck: supple. JVP not elevated.  Carotids 2+ bilat; no bruits. No lymphadenopathy or thryomegaly appreciated. Cor: LVAD hum.  Lungs: Clear. Abdomen: obese soft, nontender, non-distended. No hepatosplenomegaly. No bruits or masses. Good bowel sounds. Driveline site clean. Anchor in place.  Extremities: no cyanosis, clubbing, rash. Warm no edema  Neuro: alert & oriented x 3. No focal deficits. Moves all 4 without problem    ASSESSMENT AND PLAN:    1.  Chronic Systolic HF s/p HM3 Implantation 11/14 - due to iCM - Echo 05/01/23: EF 20%, RV moderately down (improved) - RHC (9/24): elevated biventricular filling pressures with low PAPi; CI 2.2 - S/p HM III LVAD 05/03/23 by Dr. Maren Beach - RAMP echo 11/21: Speeds left at 6000.  Speed increased to 6100 on 11/27. Speed increased to 6200 on 05/18/23.  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  - Had been struggling with RV failure. VAD speed turned down to 5800. Recent RHC as above showed well compensated hemodynamics with dry weight 307-309  - Stable NYHA II-III - Based on RHC fluid status optimized on torsemide 40 daily - Continue compression hose - Continue spiro 25 - Has been seen at [facility] and will consider for transplant once BMI down Body mass index is 40.45 kg/m.  2. LVAD  management - s/p HM-3 implant 05/03/23 - VAD interrogated personally. Parameters stable. - INR 2.2 Goal 2.0-3.0 Discussed warfarin dosing with PharmD personally. - LDH 162 - MAPs ok - DL site looks good has finished abx   3. H/o VT Arrest  Hx PVCs - had VT arrest x 2 in 10/24 - Continue Amio 200 mg daily and Mexiletine 250 mg bid.  - At next visit drop amio to 100 daily   4. CAD with chronic stable angina - s/p multiple stents to LAD and LCX - LHC (9/24) with stable 3v CAD - No s/s angina   5. DM2 - A1c 7.5 (10/24) - Per PCP   6. OSA - Not using CPAP, says machine was recalled a while ago. Needs new machine. - Will need to get him a new machine   7. Morbid obesity - Body mass index is 40.45 kg/m. - Continue Ozempic - Understands need to get BMI down near 35 for transplant   No change  Improving slwoly with AD but still struggles with RV failure and volume overload. We discussed returning to work and I do not think he will be ready to go back yet unless stamina improves.   I spent a total of 40 minutes today: 1) reviewing the patient's medical records including previous charts, labs and recent notes from other providers; 2) examining the patient and counseling them on their medical issues/explaining the plan of care; 3) adjusting meds as needed and 4) ordering lab work or other needed tests.    Arvilla Meres, MD  8:49 PM

## 2023-08-27 ENCOUNTER — Telehealth (HOSPITAL_COMMUNITY): Payer: Self-pay

## 2023-08-27 NOTE — Telephone Encounter (Signed)
 Called patient to see if he was interested in participating in the Cardiac Rehab Program. Patient will come in for orientation on 08/30/23 @ 9:30a and will attend the 10:15a exercise class.   Pensions consultant.

## 2023-08-28 ENCOUNTER — Telehealth (HOSPITAL_COMMUNITY): Payer: Self-pay

## 2023-08-28 ENCOUNTER — Ambulatory Visit (HOSPITAL_COMMUNITY): Payer: Self-pay | Admitting: Pharmacist

## 2023-08-28 LAB — POCT INR: INR: 2.3 (ref 2.0–3.0)

## 2023-08-28 NOTE — Telephone Encounter (Signed)
 Entered in error

## 2023-08-30 ENCOUNTER — Encounter (HOSPITAL_COMMUNITY)
Admission: RE | Admit: 2023-08-30 | Discharge: 2023-08-30 | Disposition: A | Discharge status: Home / Self Care | Source: Ambulatory Visit | Attending: Internal Medicine | Admitting: Internal Medicine

## 2023-08-30 VITALS — BP 84/0 | HR 77 | Ht 74.5 in | Wt 328.9 lb

## 2023-08-30 DIAGNOSIS — Z95811 Presence of heart assist device: Secondary | ICD-10-CM | POA: Insufficient documentation

## 2023-08-30 DIAGNOSIS — Z48812 Encounter for surgical aftercare following surgery on the circulatory system: Secondary | ICD-10-CM | POA: Diagnosis not present

## 2023-08-30 NOTE — Progress Notes (Signed)
 Cardiac Rehab Medication Review by a Nurse  Does the patient  feel that his/her medications are working for him/her?  yes  Has the patient been experiencing any side effects to the medications prescribed?  no  Does the patient measure his/her own blood pressure or blood glucose at home?  yes   Does the patient have any problems obtaining medications due to transportation or finances?   no  Understanding of regimen: good Understanding of indications: good Potential of compliance: good    Nurse comments: Joe [name] is taking his medications as prescribed and has a good understanding of what his medications are for. Joe [name] has a CGM for checking his CBG's with.    Arta [facility] RN 08/30/2023 9:35 AM

## 2023-08-30 NOTE — Progress Notes (Addendum)
 Cardiac Individual Treatment Plan  Patient Details  Name: Joe [name] MRN: [id number] Date of Birth: 16-Jul-1970 Referring Provider:   Flowsheet Row INTENSIVE CARDIAC REHAB ORIENT from 08/30/2023 in [facility] for [facility], V[facility]  Referring Provider Arvilla Meres, MD       Initial Encounter Date:  Flowsheet Row INTENSIVE CARDIAC REHAB ORIENT from 08/30/2023 in [facility] for Heart, Vascular, & Lung Health  Date 08/30/23       Visit Diagnosis: 05/02/24 LVAD, HM3  Patient's Home Medications on Admission:  Current Outpatient Medications:    acetaminophen (TYLENOL) 500 MG tablet, Take 1,000 mg by mouth every 6 (six) hours as needed for moderate pain or headache., Disp: , Rfl:    allopurinol (ZYLOPRIM) 100 MG tablet, Take 100 mg by mouth in the morning., Disp: , Rfl:    amiodarone (PACERONE) 200 MG tablet, Take 1 tablet (200 mg total) by mouth daily., Disp: 30 tablet, Rfl: 5   colchicine 0.6 MG tablet, Take 0.6 mg by mouth daily as needed (Gout)., Disp: , Rfl:    diclofenac Sodium (VOLTAREN) 1 % GEL, APPLY 1 G TOPICALLY DAILY. (Patient taking differently: Apply 1 g topically 4 (four) times daily as needed (pain.).), Disp: 100 g, Rfl: 0   digoxin (LANOXIN) 0.125 MG tablet, Take 1 tablet (0.125 mg total) by mouth daily., Disp: 30 tablet, Rfl: 5   escitalopram (LEXAPRO) 10 MG tablet, Take 10 mg by mouth at bedtime., Disp: , Rfl:    ezetimibe (ZETIA) 10 MG tablet, Take 1 tablet (10 mg total) by mouth daily., Disp: 30 tablet, Rfl: 5   gabapentin (NEURONTIN) 300 MG capsule, Take 300 mg by mouth 3 (three) times daily., Disp: , Rfl:    levothyroxine (SYNTHROID) 25 MCG tablet, Take 25 mcg by mouth daily before breakfast., Disp: , Rfl:    metFORMIN (GLUCOPHAGE-XR) 500 MG 24 hr tablet, Take 1,000 mg by mouth in the morning and at bedtime., Disp: , Rfl:    mexiletine (MEXITIL) 250 MG capsule, Take 1 capsule (250 mg total) by  mouth 2 (two) times daily., Disp: 60 capsule, Rfl: 11   multivitamin (ONE-A-DAY MEN'S) TABS tablet, Take 1 tablet by mouth in the morning., Disp: , Rfl:    omeprazole (PRILOSEC) 40 MG capsule, Take 1 capsule (40 mg total) by mouth daily., Disp: 90 capsule, Rfl: 1   ondansetron (ZOFRAN-ODT) 4 MG disintegrating tablet, Take 1 tablet (4 mg total) by mouth every 8 (eight) hours as needed for nausea or vomiting., Disp: 20 tablet, Rfl: 0   OZEMPIC, 2 MG/DOSE, 8 MG/3ML SOPN, Inject 2 mg into the skin every Monday., Disp: , Rfl:    Polyethylene Glycol 400 (BLINK TEARS OP), Place 2 drops into both eyes 4 (four) times daily as needed (for dry eyes)., Disp: , Rfl:    potassium chloride SA (KLOR-CON M) 20 MEQ tablet, Take 3 tablets (60 mEq total) by mouth 2 (two) times daily., Disp: 540 tablet, Rfl: 1   rosuvastatin (CRESTOR) 40 MG tablet, Take 1 tablet (40 mg total) by mouth daily., Disp: 90 tablet, Rfl: 2   sildenafil (REVATIO) 20 MG tablet, Take 1 tablet (20 mg total) by mouth 3 (three) times daily., Disp: 90 tablet, Rfl: 5   sildenafil (VIAGRA) 50 MG tablet, Take 1 tablet (50 mg total) by mouth daily as needed for erectile dysfunction., Disp: 10 tablet, Rfl: 6   spironolactone (ALDACTONE) 25 MG tablet, Take 1 tablet (25 mg total) by mouth  daily., Disp: 30 tablet, Rfl: 5   torsemide (DEMADEX) 20 MG tablet, TAKE 2 TABLETS (40 MG TOTAL) BY MOUTH DAILY., Disp: 180 tablet, Rfl: 2   warfarin (COUMADIN) 5 MG tablet, Take 1.5 tablets (7.5 mg total) by mouth daily. (Patient taking differently: Take 7.5 mg by mouth every evening.), Disp: 60 tablet, Rfl: 5  Past Medical History: Past Medical History:  Diagnosis Date   Abnormal EKG    Acute systolic heart failure (HCC) 09/06/2017   Angina pectoris (HCC) 09/05/2017   Body mass index 45.0-49.9, adult (HCC)    CAD S/P percutaneous coronary angioplasty 09/07/2017   mLAD and dLAD PCI with DES 09/06/17   CHF (congestive heart failure) (HCC)    Chronic systolic  (congestive) heart failure (HCC) 03/19/2019   Complication of anesthesia 1995   "had hard time waking up; breathing w/vasectomy"   Coronary artery disease    Erectile dysfunction    Essential hypertension, benign    Fatigue    GERD (gastroesophageal reflux disease)    Gout    High cholesterol    "just started tx yesterday" (09/06/2017)   History of gout    "RX prn" (09/06/2017)   Ischemic cardiomyopathy 09/07/2017   EF 25-35%   Muscular chest pain    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   Below Left Nare (MOH's)   Obesity    Obstructive sleep apnea    OSA on CPAP    Plantar fasciitis    Pre-operative clearance 02/11/2018   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/02/2019   [employer]   Right bundle branch block    Shortness of breath 09/05/2017   Testosterone deficiency    Type 2 diabetes mellitus with complication, without long-term current use of insulin (HCC) 09/05/2017   Type II diabetes mellitus (HCC)    "started tx 08/28/2017"    Tobacco Use: Social History   Tobacco Use  Smoking Status Never  Smokeless Tobacco Never    Labs: Review Flowsheet  More data exists      Latest Ref Rng & Units 05/19/2023 05/20/2023 05/21/2023 07/19/2023 08/06/2023  Labs for ITP Cardiac and Pulmonary Rehab  Hemoglobin A1c 4.8 - 5.6 % - - - 9.0  -  Bicarbonate 20.0 - 28.0 mmol/L - - - - 29.7  30.8   TCO2 22 - 32 mmol/L - - - - 31  32   O2 Saturation % 65.8  56.7  60.2  - 61  64     Details       Multiple values from one day are sorted in reverse-chronological order         Capillary Blood Glucose: Lab Results  Component Value Date   GLUCAP 173 (H) 08/06/2023   GLUCAP 159 (H) 05/21/2023   GLUCAP 133 (H) 05/21/2023   GLUCAP 196 (H) 05/20/2023   GLUCAP 134 (H) 05/20/2023     Exercise Target Goals: Exercise Program Goal: Individual exercise prescription set using results from initial 6 min walk test and THRR while considering  patient's  activity barriers and safety.   Exercise Prescription Goal: Initial exercise prescription builds to 30-45 minutes a day of aerobic activity, 2-3 days per week.  Home exercise guidelines will be given to patient during program as part of exercise prescription that the participant will acknowledge.  Activity Barriers & Risk Stratification:   6 Minute Walk:  6 Minute Walk     Row Name 08/30/23 1144         6 Minute  Walk   Phase Initial     Distance 1440 feet     Walk Time 6 minutes     # of Rest Breaks 0     MPH 2.73     METS 2.89     RPE 13     Perceived Dyspnea  2     VO2 Peak 10.12     Symptoms Yes (comment)     Comments SOB, resolved with rest     Resting HR 77 bpm     Resting BP 84/0  MAP     Resting Oxygen Saturation  97 %     Exercise Oxygen Saturation  during 6 min walk 97 %     Max Ex. HR 100 bpm     Max Ex. BP 86/0  MAP     2 Minute Post BP 92/0  MAP              Oxygen Initial Assessment:   Oxygen Re-Evaluation:   Oxygen Discharge (Final Oxygen Re-Evaluation):   Initial Exercise Prescription:  Initial Exercise Prescription - 08/30/23 1100       Date of Initial Exercise RX and Referring Provider   Date 08/30/23    Referring Provider Arvilla Meres, MD    Expected Discharge Date 11/21/23      NuStep   Level 1    SPM 65    Minutes 15    METs 2.5      Track   Laps 10    Minutes 15    METs 2.5      Prescription Details   Frequency (times per week) 3    Duration Progress to 30 minutes of continuous aerobic without signs/symptoms of physical distress      Intensity   THRR 40-80% of Max Heartrate 67-134    Ratings of Perceived Exertion 11-13    Perceived Dyspnea 0-4      Progression   Progression Continue progressive overload as per policy without signs/symptoms or physical distress.      Resistance Training   Training Prescription Yes    Weight 3    Reps 10-15             Perform Capillary Blood Glucose checks as  needed.  Exercise Prescription Changes:   Exercise Comments:   Exercise Goals and Review:   Exercise Goals     Row Name 08/30/23 0929             Exercise Goals   Increase Physical Activity Yes       Intervention Provide advice, education, support and counseling about physical activity/exercise needs.;Develop an individualized exercise prescription for aerobic and resistive training based on initial evaluation findings, risk stratification, comorbidities and participant's personal goals.       Expected Outcomes Long Term: Exercising regularly at least 3-5 days a week.;Short Term: Attend rehab on a regular basis to increase amount of physical activity.;Long Term: Add in home exercise to make exercise part of routine and to increase amount of physical activity.       Increase Strength and Stamina Yes       Intervention Develop an individualized exercise prescription for aerobic and resistive training based on initial evaluation findings, risk stratification, comorbidities and participant's personal goals.;Provide advice, education, support and counseling about physical activity/exercise needs.       Expected Outcomes Short Term: Increase workloads from initial exercise prescription for resistance, speed, and METs.;Short Term: Perform resistance training exercises routinely during rehab and add in  resistance training at home;Long Term: Improve cardiorespiratory fitness, muscular endurance and strength as measured by increased METs and functional capacity ( )       Able to understand and use rate of perceived exertion (RPE) scale Yes       Intervention Provide education and explanation on how to use RPE scale       Expected Outcomes Short Term: Able to use RPE daily in rehab to express subjective intensity level;Long Term:  Able to use RPE to guide intensity level when exercising independently       Knowledge and understanding of Target Heart Rate Range (THRR) Yes       Intervention  Provide education and explanation of THRR including how the numbers were predicted and where they are located for reference       Expected Outcomes Short Term: Able to state/look up THRR;Short Term: Able to use daily as guideline for intensity in rehab;Long Term: Able to use THRR to govern intensity when exercising independently       Understanding of Exercise Prescription Yes       Intervention Provide education, explanation, and written materials on patient's individual exercise prescription       Expected Outcomes Short Term: Able to explain program exercise prescription;Long Term: Able to explain home exercise prescription to exercise independently                Exercise Goals Re-Evaluation :   Discharge Exercise Prescription (Final Exercise Prescription Changes):   Nutrition:  Target Goals: Understanding of nutrition guidelines, daily intake of sodium 1500mg , cholesterol 200mg , calories 30% from fat and 7% or less from saturated fats, daily to have 5 or more servings of fruits and vegetables.  Biometrics:  Pre Biometrics - 08/30/23 1143       Pre Biometrics   Waist Circumference 53.5 inches    Hip Circumference 47 inches    Waist to Hip Ratio 1.14 %    Triceps Skinfold 42 mm    % Body Fat 42 %    Grip Strength 40 kg    Flexibility 0 in   not done, low back issues   Single Leg Stand 7.12 seconds              Nutrition Therapy Plan and Nutrition Goals:   Nutrition Assessments:  MEDIFICTS Score Key: >=70 Need to make dietary changes  40-70 Heart Healthy Diet <= 40 Therapeutic Level Cholesterol Diet    Picture Your Plate Scores: <96 Unhealthy dietary pattern with much room for improvement. 41-50 Dietary pattern unlikely to meet recommendations for good health and room for improvement. 51-60 More healthful dietary pattern, with some room for improvement.  >60 Healthy dietary pattern, although there may be some specific behaviors that could be improved.     Nutrition Goals Re-Evaluation:   Nutrition Goals Re-Evaluation:   Nutrition Goals Discharge (Final Nutrition Goals Re-Evaluation):   Psychosocial: Target Goals: Acknowledge presence or absence of significant depression and/or stress, maximize coping skills, provide positive support system. Participant is able to verbalize types and ability to use techniques and skills needed for reducing stress and depression.  Initial Review & Psychosocial Screening:  Initial Psych Review & Screening - 08/30/23 0938       Initial Review   Current issues with History of Depression;Current Stress Concerns;Current Depression    Source of Stress Concerns Chronic Illness;Financial;Unable to perform yard/household activities;Unable to participate in former interests or hobbies    Comments having heart failure and the LVAD has been  a lifechanging event for Demarquez. Ector cant go on the transplant list until he looses weight. Roshan admits to [employer] low energy since hospitalization. Isiaih has not been able to work since November 2022 and has applied for disability/ social security. Doroteo has no income in and has to rely on wife. Dominque is currently depressed and is taking an antidepressant that is currently controlling his depression. Andren is not interested in counseling at this time. Tymere does meet with LVAD support group once a month.      Family Dynamics   Good Support System? Yes   Suvan has his wife, his son and stepdaughter and neighbors for support.   Comments will continue to monitor and offer support as needed      Barriers   Psychosocial barriers to participate in program The patient should benefit from training in stress management and relaxation.      Screening Interventions   Interventions Encouraged to exercise;To provide support and resources with identified psychosocial needs;Provide feedback about the scores to participant    Expected Outcomes Long Term Goal: Stressors or  current issues are controlled or eliminated.             Quality of Life Scores:  Quality of Life - 08/30/23 1158       Quality of Life   Select Quality of Life      Quality of Life Scores   Health/Function Pre 16.4 %    Socioeconomic Pre 23.79 %    Psych/Spiritual Pre 22.29 %    Family Pre 27.6 %    GLOBAL Pre 20.78 %            Scores of 19 and below usually indicate a poorer quality of life in these areas.  A difference of  2-3 points is a clinically meaningful difference.  A difference of 2-3 points in the total score of the Quality of Life Index has been associated with significant improvement in overall quality of life, self-image, physical symptoms, and general health in studies assessing change in quality of life.  PHQ-9: Review Flowsheet       08/30/2023  Depression screen PHQ 2/9  Decreased Interest 0  Down, Depressed, Hopeless 1  PHQ - 2 Score 1  Altered sleeping 0  Tired, decreased energy 3  Change in appetite 0  Feeling bad or failure about yourself  2  Trouble concentrating 1  Moving slowly or fidgety/restless 0  Suicidal thoughts 0  PHQ-9 Score 7  Difficult doing work/chores Very difficult   Interpretation of Total Score  Total Score Depression Severity:  1-4 = Minimal depression, 5-9 = Mild depression, 10-14 = Moderate depression, 15-19 = Moderately severe depression, 20-27 = Severe depression   Psychosocial Evaluation and Intervention:   Psychosocial Re-Evaluation:   Psychosocial Discharge (Final Psychosocial Re-Evaluation):   Vocational Rehabilitation: Provide vocational rehab assistance to qualifying candidates.   Vocational Rehab Evaluation & Intervention:  Vocational Rehab - 08/30/23 1039       Initial Vocational Rehab Evaluation & Intervention   Assessment shows need for Vocational Rehabilitation No   Laura is currently unemployed and is in the process of applying for social security disability. Benson is not intersted in  vocational rehab at this time.            Education: Education Goals: Education classes will be provided on a weekly basis, covering required topics. Participant will state understanding/return demonstration of topics presented.     Core Videos: Exercise    Move  It!  Clinical staff conducted group or individual video education with verbal and written material and guidebook.  Patient learns the recommended Pritikin exercise program. Exercise with the goal of living a long, healthy life. Some of the health benefits of exercise include controlled diabetes, healthier blood pressure levels, improved cholesterol levels, improved heart and lung capacity, improved sleep, and better body composition. Everyone should speak with their doctor before starting or changing an exercise routine.  Biomechanical Limitations Clinical staff conducted group or individual video education with verbal and written material and guidebook.  Patient learns how biomechanical limitations can impact exercise and how we can mitigate and possibly overcome limitations to have an impactful and balanced exercise routine.  Body Composition Clinical staff conducted group or individual video education with verbal and written material and guidebook.  Patient learns that body composition (ratio of muscle mass to fat mass) is a key component to assessing overall fitness, rather than body weight alone. Increased fat mass, especially visceral belly fat, can put [place] at increased risk for metabolic syndrome, type 2 diabetes, heart disease, and even death. It is recommended to combine diet and exercise (cardiovascular and resistance training) to improve your body composition. Seek guidance from your physician and exercise physiologist before implementing an exercise routine.  Exercise Action Plan Clinical staff conducted group or individual video education with verbal and written material and guidebook.  Patient learns the recommended  strategies to achieve and enjoy long-term exercise adherence, including variety, self-motivation, self-efficacy, and positive decision making. Benefits of exercise include fitness, good health, weight management, more energy, better sleep, less stress, and overall well-being.  Medical   Heart Disease Risk Reduction Clinical staff conducted group or individual video education with verbal and written material and guidebook.  Patient learns our heart is our most vital organ as it circulates oxygen, nutrients, white blood cells, and hormones throughout the entire body, and carries waste away. Data supports a plant-based eating plan like the Pritikin Program for its effectiveness in slowing progression of and reversing heart disease. The video provides a number of recommendations to address heart disease.   Metabolic Syndrome and Belly Fat  Clinical staff conducted group or individual video education with verbal and written material and guidebook.  Patient learns what metabolic syndrome is, how it leads to heart disease, and how one can reverse it and keep it from coming back. You have metabolic syndrome if you have 3 of the following 5 criteria: abdominal obesity, high blood pressure, high triglycerides, low HDL cholesterol, and high blood sugar.  Hypertension and Heart Disease Clinical staff conducted group or individual video education with verbal and written material and guidebook.  Patient learns that high blood pressure, or hypertension, is very common in the [place]. Hypertension is largely due to excessive salt intake, but other important risk factors include being overweight, physical inactivity, drinking too much alcohol, smoking, and not eating enough potassium from fruits and vegetables. High blood pressure is a leading risk factor for heart attack, stroke, congestive heart failure, dementia, kidney failure, and premature death. Long-term effects of excessive salt intake include stiffening  of the arteries and thickening of heart muscle and organ damage. Recommendations include ways to reduce hypertension and the risk of heart disease.  Diseases of Our Time - Focusing on Diabetes Clinical staff conducted group or individual video education with verbal and written material and guidebook.  Patient learns why the best way to stop diseases of our time is prevention, through food and other lifestyle  changes. Medicine (such as prescription pills and surgeries) is often only a Band-Aid on the problem, not a long-term solution. Most common diseases of our time include obesity, type 2 diabetes, hypertension, heart disease, and cancer. The Pritikin Program is recommended and has been proven to help reduce, reverse, and/or prevent the damaging effects of metabolic syndrome.  Nutrition   Overview of the Pritikin Eating Plan  Clinical staff conducted group or individual video education with verbal and written material and guidebook.  Patient learns about the Pritikin Eating Plan for disease risk reduction. The Pritikin Eating Plan emphasizes a wide variety of unrefined, minimally-processed carbohydrates, like fruits, vegetables, whole grains, and legumes. Go, Caution, and Stop food choices are explained. Plant-based and lean animal proteins are emphasized. Rationale provided for low sodium intake for blood pressure control, low added sugars for blood sugar stabilization, and low added fats and oils for coronary artery disease risk reduction and weight management.  Calorie Density  Clinical staff conducted group or individual video education with verbal and written material and guidebook.  Patient learns about calorie density and how it impacts the Pritikin Eating Plan. Knowing the characteristics of the food you choose will help you decide whether those foods will lead to weight gain or weight loss, and whether you want to consume more or less of them. Weight loss is usually a side effect of the  Pritikin Eating Plan because of its focus on low calorie-dense foods.  Label Reading  Clinical staff conducted group or individual video education with verbal and written material and guidebook.  Patient learns about the Pritikin recommended label reading guidelines and corresponding recommendations regarding calorie density, added sugars, sodium content, and whole grains.  Dining Out - Part 1  Clinical staff conducted group or individual video education with verbal and written material and guidebook.  Patient learns that restaurant meals can be sabotaging because they can be so high in calories, fat, sodium, and/or sugar. Patient learns recommended strategies on how to positively address this and avoid unhealthy pitfalls.  Facts on Fats  Clinical staff conducted group or individual video education with verbal and written material and guidebook.  Patient learns that lifestyle modifications can be just as effective, if not more so, as many medications for lowering your risk of heart disease. A Pritikin lifestyle can help to reduce your risk of inflammation and atherosclerosis (cholesterol build-up, or plaque, in the artery walls). Lifestyle interventions such as dietary choices and physical activity address the cause of atherosclerosis. A review of the types of fats and their impact on blood cholesterol levels, along with dietary recommendations to reduce fat intake is also included.  Nutrition Action Plan  Clinical staff conducted group or individual video education with verbal and written material and guidebook.  Patient learns how to incorporate Pritikin recommendations into their lifestyle. Recommendations include planning and keeping personal health goals in mind as an important part of their success.  Healthy Mind-Set    Healthy Minds, Bodies, Hearts  Clinical staff conducted group or individual video education with verbal and written material and guidebook.  Patient learns how to identify  when they are stressed. Video will discuss the impact of that stress, as well as the many benefits of stress management. Patient will also be introduced to stress management techniques. The way we think, act, and feel has an impact on our hearts.  How Our Thoughts Can Heal Our Hearts  Clinical staff conducted group or individual video education with verbal and written material and  guidebook.  Patient learns that negative thoughts can cause depression and anxiety. This can result in negative lifestyle behavior and serious health problems. Cognitive behavioral therapy is an effective method to help control our thoughts in order to change and improve our emotional outlook.  Additional Videos:  Exercise    Improving Performance  Clinical staff conducted group or individual video education with verbal and written material and guidebook.  Patient learns to use a non-linear approach by alternating intensity levels and lengths of time spent exercising to help burn more calories and lose more body fat. Cardiovascular exercise helps improve heart health, metabolism, hormonal balance, blood sugar control, and recovery from fatigue. Resistance training improves strength, endurance, balance, coordination, reaction time, metabolism, and muscle mass. Flexibility exercise improves circulation, posture, and balance. Seek guidance from your physician and exercise physiologist before implementing an exercise routine and learn your capabilities and proper form for all exercise.  Introduction to Yoga  Clinical staff conducted group or individual video education with verbal and written material and guidebook.  Patient learns about yoga, a discipline of the coming together of mind, breath, and body. The benefits of yoga include improved flexibility, improved range of motion, better posture and core strength, increased lung function, weight loss, and positive self-image. Yoga's heart health benefits include lowered blood  pressure, healthier heart rate, decreased cholesterol and triglyceride levels, improved immune function, and reduced stress. Seek guidance from your physician and exercise physiologist before implementing an exercise routine and learn your capabilities and proper form for all exercise.  Medical   Aging: Enhancing Your Quality of Life  Clinical staff conducted group or individual video education with verbal and written material and guidebook.  Patient learns key strategies and recommendations to stay in good physical health and enhance quality of life, such as prevention strategies, having an advocate, securing a Health Care Proxy and Power of Attorney, and keeping a list of medications and system for tracking them. It also discusses how to avoid risk for bone loss.  Biology of Weight Control  Clinical staff conducted group or individual video education with verbal and written material and guidebook.  Patient learns that weight gain occurs because we consume more calories than we burn (eating more, moving less). Even if your body weight is normal, you may have higher ratios of fat compared to muscle mass. Too much body fat puts you at increased risk for cardiovascular disease, heart attack, stroke, type 2 diabetes, and obesity-related cancers. In addition to exercise, following the Pritikin Eating Plan can help reduce your risk.  Decoding Lab Results  Clinical staff conducted group or individual video education with verbal and written material and guidebook.  Patient learns that lab test reflects one measurement whose values change over time and are influenced by many factors, including medication, stress, sleep, exercise, food, hydration, pre-existing medical conditions, and more. It is recommended to use the knowledge from this video to become more involved with your lab results and evaluate your numbers to speak with your doctor.   Diseases of Our Time - Overview  Clinical staff conducted group or  individual video education with verbal and written material and guidebook.  Patient learns that according to the CDC, 50% to 70% of chronic diseases (such as obesity, type 2 diabetes, elevated lipids, hypertension, and heart disease) are avoidable through lifestyle improvements including healthier food choices, listening to satiety cues, and increased physical activity.  Sleep Disorders Clinical staff conducted group or individual video education with verbal and written material  and guidebook.  Patient learns how good quality and duration of sleep are important to overall health and well-being. Patient also learns about sleep disorders and how they impact health along with recommendations to address them, including discussing with a physician.  Nutrition  Dining Out - Part 2 Clinical staff conducted group or individual video education with verbal and written material and guidebook.  Patient learns how to plan ahead and communicate in order to maximize their dining experience in a healthy and nutritious manner. Included are recommended food choices based on the type of restaurant the patient is visiting.   Fueling a Banker conducted group or individual video education with verbal and written material and guidebook.  There is a strong connection between our food choices and our health. Diseases like obesity and type 2 diabetes are very prevalent and are in large-part due to lifestyle choices. The Pritikin Eating Plan provides plenty of food and hunger-curbing satisfaction. It is easy to follow, affordable, and helps reduce health risks.  Menu Workshop  Clinical staff conducted group or individual video education with verbal and written material and guidebook.  Patient learns that restaurant meals can sabotage health goals because they are often packed with calories, fat, sodium, and sugar. Recommendations include strategies to plan ahead and to communicate with the manager,  chef, or server to help order a healthier meal.  Planning Your Eating Strategy  Clinical staff conducted group or individual video education with verbal and written material and guidebook.  Patient learns about the Pritikin Eating Plan and its benefit of reducing the risk of disease. The Pritikin Eating Plan does not focus on calories. Instead, it emphasizes high-quality, nutrient-rich foods. By knowing the characteristics of the foods, we choose, we can determine their calorie density and make informed decisions.  Targeting Your Nutrition Priorities  Clinical staff conducted group or individual video education with verbal and written material and guidebook.  Patient learns that lifestyle habits have a tremendous impact on disease risk and progression. This video provides eating and physical activity recommendations based on your personal health goals, such as reducing LDL cholesterol, losing weight, preventing or controlling type 2 diabetes, and reducing high blood pressure.  Vitamins and Minerals  Clinical staff conducted group or individual video education with verbal and written material and guidebook.  Patient learns different ways to obtain key vitamins and minerals, including through a recommended healthy diet. It is important to discuss all supplements you take with your doctor.   Healthy Mind-Set    Smoking Cessation  Clinical staff conducted group or individual video education with verbal and written material and guidebook.  Patient learns that cigarette smoking and tobacco addiction pose a serious health risk which affects millions of people. Stopping smoking will significantly reduce the risk of heart disease, lung disease, and many forms of cancer. Recommended strategies for quitting are covered, including working with your doctor to develop a successful plan.  Culinary   Becoming a Set designer conducted group or individual video education with verbal and written  material and guidebook.  Patient learns that cooking at home can be healthy, cost-effective, quick, and puts them in control. Keys to cooking healthy recipes will include looking at your recipe, assessing your equipment needs, planning ahead, making it simple, choosing cost-effective seasonal ingredients, and limiting the use of added fats, salts, and sugars.  Cooking - Breakfast and Snacks  Clinical staff conducted group or individual video education with verbal and written material  and guidebook.  Patient learns how important breakfast is to satiety and nutrition through the entire day. Recommendations include key foods to eat during breakfast to help stabilize blood sugar levels and to prevent overeating at meals later in the day. Planning ahead is also a key component.  Cooking - Educational psychologist conducted group or individual video education with verbal and written material and guidebook.  Patient learns eating strategies to improve overall health, including an approach to cook more at home. Recommendations include thinking of animal protein as a side on your plate rather than center stage and focusing instead on lower calorie dense options like vegetables, fruits, whole grains, and plant-based proteins, such as beans. Making sauces in large quantities to freeze for later and leaving the skin on your vegetables are also recommended to maximize your experience.  Cooking - Healthy Salads and Dressing Clinical staff conducted group or individual video education with verbal and written material and guidebook.  Patient learns that vegetables, fruits, whole grains, and legumes are the foundations of the Pritikin Eating Plan. Recommendations include how to incorporate each of these in flavorful and healthy salads, and how to create homemade salad dressings. Proper handling of ingredients is also covered. Cooking - Soups and [employer] - Soups and Desserts Clinical staff conducted  group or individual video education with verbal and written material and guidebook.  Patient learns that Pritikin soups and desserts make for easy, nutritious, and delicious snacks and meal components that are low in sodium, fat, sugar, and calorie density, while high in vitamins, minerals, and filling fiber. Recommendations include simple and healthy ideas for soups and desserts.   Overview     The Pritikin Solution Program Overview Clinical staff conducted group or individual video education with verbal and written material and guidebook.  Patient learns that the results of the Pritikin Program have been documented in more than 100 articles published in peer-reviewed journals, and the benefits include reducing risk factors for (and, in some cases, even reversing) high cholesterol, high blood pressure, type 2 diabetes, obesity, and more! An overview of the three key pillars of the Pritikin Program will be covered: eating well, doing regular exercise, and having a healthy mind-set.  WORKSHOPS  Exercise: Exercise Basics: Building Your Action Plan Clinical staff led group instruction and group discussion with PowerPoint presentation and patient guidebook. To enhance the learning environment the use of posters, models and videos may be added. At the conclusion of this workshop, patients will comprehend the difference between physical activity and exercise, as well as the benefits of incorporating both, into their routine. Patients will understand the FITT (Frequency, Intensity, Time, and Type) principle and how to use it to build an exercise action plan. In addition, safety concerns and other considerations for exercise and cardiac rehab will be addressed by the presenter. The purpose of this lesson is to promote a comprehensive and effective weekly exercise routine in order to improve patients' overall level of fitness.   Managing Heart Disease: Your Path to a Healthier Heart Clinical staff led  group instruction and group discussion with PowerPoint presentation and patient guidebook. To enhance the learning environment the use of posters, models and videos may be added.At the conclusion of this workshop, patients will understand the anatomy and physiology of the heart. Additionally, they will understand how Pritikin's three pillars impact the risk factors, the progression, and the management of heart disease.  The purpose of this lesson is to provide a high-level  overview of the heart, heart disease, and how the Pritikin lifestyle positively impacts risk factors.  Exercise Biomechanics Clinical staff led group instruction and group discussion with PowerPoint presentation and patient guidebook. To enhance the learning environment the use of posters, models and videos may be added. Patients will learn how the structural parts of their bodies function and how these functions impact their daily activities, movement, and exercise. Patients will learn how to promote a neutral spine, learn how to manage pain, and identify ways to improve their physical movement in order to promote healthy living. The purpose of this lesson is to expose patients to common physical limitations that impact physical activity. Participants will learn practical ways to adapt and manage aches and pains, and to minimize their effect on regular exercise. Patients will learn how to maintain good posture while sitting, walking, and lifting.  Balance Training and Fall Prevention  Clinical staff led group instruction and group discussion with PowerPoint presentation and patient guidebook. To enhance the learning environment the use of posters, models and videos may be added. At the conclusion of this workshop, patients will understand the importance of their sensorimotor skills (vision, proprioception, and the vestibular system) in maintaining their ability to balance as they age. Patients will apply a variety of balancing  exercises that are appropriate for their current level of function. Patients will understand the common causes for poor balance, possible solutions to these problems, and ways to modify their physical environment in order to minimize their fall risk. The purpose of this lesson is to teach patients about the importance of maintaining balance as they age and ways to minimize their risk of falling.  WORKSHOPS   Nutrition:  Fueling a Ship broker led group instruction and group discussion with PowerPoint presentation and patient guidebook. To enhance the learning environment the use of posters, models and videos may be added. Patients will review the foundational principles of the Pritikin Eating Plan and understand what constitutes a serving size in each of the food groups. Patients will also learn Pritikin-friendly foods that are better choices when away from home and review make-ahead meal and snack options. Calorie density will be reviewed and applied to three nutrition priorities: weight maintenance, weight loss, and weight gain. The purpose of this lesson is to reinforce (in a group setting) the key concepts around what patients are recommended to eat and how to apply these guidelines when away from home by planning and selecting Pritikin-friendly options. Patients will understand how calorie density may be adjusted for different weight management goals.  Mindful Eating  Clinical staff led group instruction and group discussion with PowerPoint presentation and patient guidebook. To enhance the learning environment the use of posters, models and videos may be added. Patients will briefly review the concepts of the Pritikin Eating Plan and the importance of low-calorie dense foods. The concept of mindful eating will be introduced as well as the importance of paying attention to internal hunger signals. Triggers for non-hunger eating and techniques for dealing with triggers will be explored.  The purpose of this lesson is to provide patients with the opportunity to review the basic principles of the Pritikin Eating Plan, discuss the value of eating mindfully and how to measure internal cues of hunger and fullness using the Hunger Scale. Patients will also discuss reasons for non-hunger eating and learn strategies to use for controlling emotional eating.  Targeting Your Nutrition Priorities Clinical staff led group instruction and group discussion with PowerPoint presentation and  patient guidebook. To enhance the learning environment the use of posters, models and videos may be added. Patients will learn how to determine their genetic susceptibility to disease by reviewing their family history. Patients will gain insight into the importance of diet as part of an overall healthy lifestyle in mitigating the impact of genetics and other environmental insults. The purpose of this lesson is to provide patients with the opportunity to assess their personal nutrition priorities by looking at their family history, their own health history and current risk factors. Patients will also be able to discuss ways of prioritizing and modifying the Pritikin Eating Plan for their highest risk areas  Menu  Clinical staff led group instruction and group discussion with PowerPoint presentation and patient guidebook. To enhance the learning environment the use of posters, models and videos may be added. Using menus brought in from [employer], or printed from [employer], patients will apply the Pritikin dining out guidelines that were presented in the [employer] video. Patients will also be able to practice these guidelines in a variety of provided scenarios. The purpose of this lesson is to provide patients with the opportunity to practice hands-on learning of the Pritikin Dining Out guidelines with actual menus and practice scenarios.  Label Reading Clinical staff led group instruction  and group discussion with PowerPoint presentation and patient guidebook. To enhance the learning environment the use of posters, models and videos may be added. Patients will review and discuss the Pritikin label reading guidelines presented in Pritikin's Label Reading Educational series video. Using fool labels brought in from local grocery stores and markets, patients will apply the label reading guidelines and determine if the packaged food meet the Pritikin guidelines. The purpose of this lesson is to provide patients with the opportunity to review, discuss, and practice hands-on learning of the Pritikin Label Reading guidelines with actual packaged food labels. Cooking School  Pritikin's [employer] are designed to teach patients ways to prepare quick, simple, and affordable recipes at home. The importance of nutrition's role in chronic disease risk reduction is reflected in its emphasis in the overall Pritikin program. By learning how to prepare essential core Pritikin Eating Plan recipes, patients will increase control over what they eat; be able to customize the flavor of foods without the use of added salt, sugar, or fat; and improve the quality of the food they consume. By learning a set of core recipes which are easily assembled, quickly prepared, and affordable, patients are more likely to prepare more healthy foods at home. These workshops focus on convenient breakfasts, simple entres, side dishes, and desserts which can be prepared with minimal effort and are consistent with nutrition recommendations for cardiovascular risk reduction. Cooking [employer] are taught by a Armed forces logistics/support/administrative officer (RD) who has been trained by the [employer]. The chef or RD has a clear understanding of the importance of minimizing - if not completely eliminating - added fat, sugar, and sodium in recipes. Throughout the series of Cooking School Workshop sessions, patients will learn  about healthy ingredients and efficient methods of cooking to build confidence in their capability to prepare    Cooking School weekly topics:  Adding Flavor- Sodium-Free  Fast and Healthy Breakfasts  Powerhouse Plant-Based Proteins  Satisfying Salads and Dressings  Simple Sides and Sauces  International Cuisine-Spotlight on the [employer] Zones  Delicious Desserts  Savory Soups  [employer] - Meals in a Snap  Tasty Appetizers and Snacks  Comforting Weekend Breakfasts  One-Pot Wonders   Fast [employer] Your Pritikin Plate  WORKSHOPS   Healthy Mindset (Psychosocial):  Focused Goals, Sustainable Changes Clinical staff led group instruction and group discussion with PowerPoint presentation and patient guidebook. To enhance the learning environment the use of posters, models and videos may be added. Patients will be able to apply effective goal setting strategies to establish at least one personal goal, and then take consistent, meaningful action toward that goal. They will learn to identify common barriers to achieving personal goals and develop strategies to overcome them. Patients will also gain an understanding of how our mind-set can impact our ability to achieve goals and the importance of cultivating a positive and growth-oriented mind-set. The purpose of this lesson is to provide patients with a deeper understanding of how to set and achieve personal goals, as well as the tools and strategies needed to overcome common obstacles which may arise along the way.  From Head to Heart: The Power of a Healthy Outlook  Clinical staff led group instruction and group discussion with PowerPoint presentation and patient guidebook. To enhance the learning environment the use of posters, models and videos may be added. Patients will be able to recognize and describe the impact of emotions and mood on physical health. They will discover the importance of  self-care and explore self-care practices which may work for them. Patients will also learn how to utilize the 4 C's to cultivate a healthier outlook and better manage stress and challenges. The purpose of this lesson is to demonstrate to patients how a healthy outlook is an essential part of maintaining good health, especially as they continue their cardiac rehab journey.  Healthy Sleep for a Healthy Heart Clinical staff led group instruction and group discussion with PowerPoint presentation and patient guidebook. To enhance the learning environment the use of posters, models and videos may be added. At the conclusion of this workshop, patients will be able to demonstrate knowledge of the importance of sleep to overall health, well-being, and quality of life. They will understand the symptoms of, and treatments for, common sleep disorders. Patients will also be able to identify daytime and nighttime behaviors which impact sleep, and they will be able to apply these tools to help manage sleep-related challenges. The purpose of this lesson is to provide patients with a general overview of sleep and outline the importance of quality sleep. Patients will learn about a few of the most common sleep disorders. Patients will also be introduced to the concept of "sleep hygiene," and discover ways to self-manage certain sleeping problems through simple daily behavior changes. Finally, the workshop will motivate patients by clarifying the links between quality sleep and their goals of heart-healthy living.   Recognizing and Reducing Stress Clinical staff led group instruction and group discussion with PowerPoint presentation and patient guidebook. To enhance the learning environment the use of posters, models and videos may be added. At the conclusion of this workshop, patients will be able to understand the types of stress reactions, differentiate between acute and chronic stress, and recognize the impact that chronic  stress has on their health. They will also be able to apply different coping mechanisms, such as reframing negative self-talk. Patients will have the opportunity to practice a variety of stress management techniques, such as deep abdominal breathing, progressive muscle relaxation, and/or guided imagery.  The purpose of this lesson is to educate patients on the role of stress in their  lives and to provide healthy techniques for coping with it.  Learning Barriers/Preferences:  Learning Barriers/Preferences - 08/30/23 1158       Learning Barriers/Preferences   Learning Barriers None    Learning Preferences Audio;Computer/Internet;Group Instruction;Individual Instruction;Skilled Demonstration;Verbal Instruction;Video;Written Material;Pictoral             Education Topics:  Knowledge Questionnaire Score:  Knowledge Questionnaire Score - 08/30/23 1158       Knowledge Questionnaire Score   Pre Score 23/24             Core Components/Risk Factors/Patient Goals at Admission:  Personal Goals and Risk Factors at Admission - 08/30/23 1201       Core Components/Risk Factors/Patient Goals on Admission    Weight Management Yes;Obesity;Weight Loss    Intervention Weight Management: Develop a combined nutrition and exercise program designed to reach desired caloric intake, while maintaining appropriate intake of nutrient and fiber, sodium and fats, and appropriate energy expenditure required for the weight goal.;Weight Management: Provide education and appropriate resources to help participant work on and attain dietary goals.;Weight Management/Obesity: Establish reasonable short term and long term weight goals.;Obesity: Provide education and appropriate resources to help participant work on and attain dietary goals.    Expected Outcomes Short Term: Continue to assess and modify interventions until short term weight is achieved;Long Term: Adherence to nutrition and physical activity/exercise  program aimed toward attainment of established weight goal;Weight Loss: Understanding of general recommendations for a balanced deficit meal plan, which promotes 1-2 lb weight loss per week and includes a negative energy balance of [phone] kcal/d;Understanding recommendations for meals to include 15-35% energy as protein, 25-35% energy from fat, 35-60% energy from carbohydrates, less than 200mg  of dietary cholesterol, 20-35 gm of total fiber daily;Understanding of distribution of calorie intake throughout the day with the consumption of 4-5 meals/snacks    Diabetes Yes    Intervention Provide education about signs/symptoms and action to take for hypo/hyperglycemia.;Provide education about proper nutrition, including hydration, and aerobic/resistive exercise prescription along with prescribed medications to achieve blood glucose in normal ranges: Fasting glucose 65-99 mg/dL    Expected Outcomes Short Term: Participant verbalizes understanding of the signs/symptoms and immediate care of hyper/hypoglycemia, proper foot care and importance of medication, aerobic/resistive exercise and nutrition plan for blood glucose control.;Long Term: Attainment of HbA1C < 7%.    Heart Failure Yes    Intervention Provide a combined exercise and nutrition program that is supplemented with education, support and counseling about heart failure. Directed toward relieving symptoms such as shortness of breath, decreased exercise tolerance, and extremity edema.    Expected Outcomes Long term: Adoption of self-care skills and reduction of barriers for early signs and symptoms recognition and intervention leading to self-care maintenance.;Short term: Daily weights obtained and reported for increase. Utilizing diuretic protocols set by physician.;Short term: Attendance in program 2-3 days a week with increased exercise capacity. Reported lower sodium intake. Reported increased fruit and vegetable intake. Reports medication  compliance.;Improve functional capacity of life    Hypertension Yes    Intervention Provide education on lifestyle modifcations including regular physical activity/exercise, weight management, moderate sodium restriction and increased consumption of fresh fruit, vegetables, and low fat dairy, alcohol moderation, and smoking cessation.;Monitor prescription use compliance.    Expected Outcomes Short Term: Continued assessment and intervention until BP is < 140/30mm HG in hypertensive participants. < 130/70mm HG in hypertensive participants with diabetes, heart failure or chronic kidney disease.;Long Term: Maintenance of blood pressure at goal levels.    Lipids  Yes    Intervention Provide education and support for participant on nutrition & aerobic/resistive exercise along with prescribed medications to achieve LDL 70mg , HDL >40mg .    Expected Outcomes Long Term: Cholesterol controlled with medications as prescribed, with individualized exercise RX and with personalized nutrition plan. Value goals: LDL < 70mg , HDL > 40 mg.;Short Term: Participant states understanding of desired cholesterol values and is compliant with medications prescribed. Participant is following exercise prescription and nutrition guidelines.    Stress Yes    Intervention Offer individual and/or small group education and counseling on adjustment to heart disease, stress management and health-related lifestyle change. Teach and support self-help strategies.;Refer participants experiencing significant psychosocial distress to appropriate mental health specialists for further evaluation and treatment. When possible, include family members and significant others in education/counseling sessions.    Expected Outcomes Short Term: Participant demonstrates changes in health-related behavior, relaxation and other stress management skills, ability to obtain effective social support, and compliance with psychotropic medications if prescribed.;Long  Term: Emotional wellbeing is indicated by absence of clinically significant psychosocial distress or social isolation.             Core Components/Risk Factors/Patient Goals Review:    Core Components/Risk Factors/Patient Goals at Discharge (Final Review):    ITP Comments:  ITP Comments     Row Name 08/30/23 0928           ITP Comments Dr. Armanda Magic medical director. Introduction to [facility] education/intensive cardiac rehab. Initial orientation packet reviewed with patient.                Comments: Participant attended orientation for the cardiac rehabilitation program on  08/30/2023  to perform initial intake and exercise walk test. Patient introduced to the [facility] education and orientation packet was reviewed. Completed 6-minute walk test, measurements, initial ITP, and exercise prescription. Vital signs stable. Telemetry-normal sinus rhythm V-Paced, asymptomatic. BP = MAP. Pt present with back-up batteries and controller for LVAD. Pt will be absent the week of his birthday for a cruise.   Service time was from 0925 to 1105.   Jonna Coup, MS, ACSM-CEP 08/30/2023 12:02 PM

## 2023-09-03 ENCOUNTER — Encounter (HOSPITAL_COMMUNITY)
Admission: RE | Admit: 2023-09-03 | Discharge: 2023-09-03 | Disposition: A | Discharge status: Home / Self Care | Source: Ambulatory Visit | Attending: Internal Medicine

## 2023-09-03 DIAGNOSIS — Z95811 Presence of heart assist device: Secondary | ICD-10-CM | POA: Diagnosis not present

## 2023-09-03 LAB — GLUCOSE, CAPILLARY
Glucose-Capillary: 260 mg/dL — ABNORMAL HIGH (ref 70–99)
Glucose-Capillary: 247 mg/dL — ABNORMAL HIGH (ref 70–99)

## 2023-09-03 NOTE — Progress Notes (Signed)
 Daily Session Note  Patient Details  Name: Joe [name] MRN: [id number] Date of Birth: March 25, 1971 Referring Provider:   Flowsheet Row INTENSIVE CARDIAC REHAB ORIENT from 08/30/2023 in [facility]  Referring Provider Arvilla Meres, MD       Encounter Date: 09/03/2023  Check In:  Session Check In - 09/03/23 1106       Check-In   Supervising physician immediately available to respond to emergencies [facility] MD immediately available    Physician(s) Edd Fabian, NP    Location [facility]    Staff Present Gladstone Lighter, RN, Marton Redwood, MS, ACSM-CEP, CCRP, Exercise Physiologist;Jetta Dan Humphreys BS, ACSM-CEP, Exercise Physiologist;Johnny Hale Bogus, MS, Exercise Physiologist    Virtual Visit No    Medication changes reported     No    Fall or balance concerns reported    No    Tobacco Cessation No Change    Warm-up and Cool-down Performed as group-led instruction    Resistance Training Performed Yes    VAD Patient? Yes    PAD/SET Patient? No      VAD patient   Has back up controller? Yes    Has spare charged batteries? Yes    Has battery cables? Yes    Has compatible battery clips? Yes      Pain Assessment   Currently in Pain? No/denies    Pain Score 0-No pain    Multiple Pain Sites No             Capillary Blood Glucose: Results for orders placed or performed during the hospital encounter of 08/30/23 (from the past 24 hours)  Glucose, capillary     Status: Abnormal   Collection Time: 09/03/23 10:43 AM  Result Value Ref Range   Glucose-Capillary 260 (H) 70 - 99 mg/dL  Glucose, capillary     Status: Abnormal   Collection Time: 09/03/23 11:28 AM  Result Value Ref Range   Glucose-Capillary 247 (H) 70 - 99 mg/dL     Exercise Prescription Changes - 09/03/23 1200       Response to Exercise   Blood Pressure (Admit) --   86 MAP   Blood Pressure (Exercise) --   104 MAP   Blood  Pressure (Exit) --   96 MAP   Heart Rate (Admit) 73 bpm    Heart Rate (Exercise) 109 bpm    Heart Rate (Exit) 81 bpm    Rating of Perceived Exertion (Exercise) 15    Symptoms None    Comments Pt first day    Duration Continue with 30 min of aerobic exercise without signs/symptoms of physical distress.    Intensity THRR unchanged      Progression   Progression Continue to progress workloads to maintain intensity without signs/symptoms of physical distress.    Average METs 2.2      Resistance Training   Training Prescription Yes    Weight 3    Reps 10-15      NuStep   Level 1    SPM 67    Minutes 15    METs 1.6      Track   Laps 14    Minutes 15    METs 2.79             Social History   Tobacco Use  Smoking Status Never  Smokeless Tobacco Never    Goals Met:  Exercise tolerated well No report of concerns or symptoms today Strength  training completed today  Goals Unmet:  Not Applicable  Comments: Pt started cardiac rehab today.  Pt tolerated light exercise without difficulty. VSS, telemetry-V paced, asymptomatic.  Medication list reconciled. Pt denies barriers to medicaiton compliance.  PSYCHOSOCIAL ASSESSMENT:  PHQ-7. Reviewed quality of life see separate note    Pt enjoys spending time with his grandchildren, yard work and going on cruises.   Pt oriented to exercise equipment and routine.    Understanding verbalized. Eulogio has back up bag with him.Thayer Headings RN BSN    Dr. Armanda Magic is Medical Director for Cardiac Rehab at [facility].

## 2023-09-03 NOTE — Progress Notes (Signed)
QUALITY OF LIFE SCORE REVIEW  Pt completed Quality of Life survey as a participant in Cardiac Rehab.  Scores 21.0 or below are considered low.  Pt score very low in several areas Overall 20.27, Health and Function 17.43, socioeconomic 24.17, physiological and spiritual 20.43, family 23.90. Patient quality of life slightly altered by physical constraints which limits ability to perform as prior to recent cardiac illness. Bob says he is dissatisfied with his health due to his recent cardiac event and other health issues. Offered emotional support and reassurance.  Will continue to monitor and intervene as necessary.  Damyra Luscher, RN,BSN 06/08/2022 4:30 PM  

## 2023-09-04 ENCOUNTER — Ambulatory Visit (HOSPITAL_COMMUNITY): Payer: Self-pay | Admitting: Pharmacist

## 2023-09-04 LAB — POCT INR: INR: 2.2 (ref 2.0–3.0)

## 2023-09-05 ENCOUNTER — Encounter (HOSPITAL_COMMUNITY)
Admission: RE | Admit: 2023-09-05 | Discharge: 2023-09-05 | Discharge status: Home / Self Care | Source: Ambulatory Visit | Attending: Internal Medicine

## 2023-09-05 DIAGNOSIS — Z95811 Presence of heart assist device: Secondary | ICD-10-CM

## 2023-09-07 ENCOUNTER — Telehealth (HOSPITAL_COMMUNITY): Payer: Self-pay | Admitting: Unknown Physician Specialty

## 2023-09-07 ENCOUNTER — Encounter (HOSPITAL_COMMUNITY)
Admission: RE | Admit: 2023-09-07 | Discharge: 2023-09-07 | Disposition: A | Discharge status: Home / Self Care | Source: Ambulatory Visit | Attending: Internal Medicine | Admitting: Internal Medicine

## 2023-09-07 DIAGNOSIS — Z95811 Presence of heart assist device: Secondary | ICD-10-CM | POA: Diagnosis not present

## 2023-09-07 NOTE — Progress Notes (Signed)
 Entry MAP 106. Weight 149 kg. Patient report having increased shortness of breath. Oxygen saturation 96% on room air.  No peripheral edema noted. Map 112 on the walking track. VAD coordinator Carlton Adam notified. Okay for the patient to continue exercise. Will continue to monitor BP. Thayer Headings RN BSN

## 2023-09-07 NOTE — Telephone Encounter (Signed)
 Received page from Laurel in [facility] regarding pts MAP. She states that pt MAP was 106 prior to exercise and 112 during exercise. Pt states he is SOB. Pt continues to c/o SOB at his VAD visits as well. D/w Dr Gala Romney, pt ok to exercise. We have f/u with pt in may. Byrd Hesselbach informed that if pt consistently has elevated MAP at [facility] to let us know.  Carlton Adam RN, BSN VAD Coordinator 24/7 Pager [phone]

## 2023-09-10 ENCOUNTER — Ambulatory Visit (HOSPITAL_COMMUNITY): Payer: Self-pay | Admitting: Pharmacist

## 2023-09-10 ENCOUNTER — Telehealth (HOSPITAL_COMMUNITY): Payer: Self-pay

## 2023-09-10 ENCOUNTER — Encounter (HOSPITAL_COMMUNITY): Admission: RE | Admit: 2023-09-10 | Source: Ambulatory Visit

## 2023-09-10 LAB — POCT INR: INR: 2.1 (ref 2.0–3.0)

## 2023-09-10 NOTE — Telephone Encounter (Signed)
 Pt called stating they are too sore to attend class today and cancelled today's appt.

## 2023-09-12 ENCOUNTER — Telehealth (HOSPITAL_COMMUNITY): Payer: Self-pay

## 2023-09-12 ENCOUNTER — Encounter (HOSPITAL_COMMUNITY): Admission: RE | Admit: 2023-09-12 | Source: Ambulatory Visit

## 2023-09-12 NOTE — Telephone Encounter (Signed)
 Patient called out at 10:19am for 10:15am class, stated he will be here Friday.

## 2023-09-14 ENCOUNTER — Encounter (HOSPITAL_COMMUNITY)
Admission: RE | Admit: 2023-09-14 | Discharge: 2023-09-14 | Disposition: A | Discharge status: Home / Self Care | Source: Ambulatory Visit | Attending: Internal Medicine | Admitting: Internal Medicine

## 2023-09-14 DIAGNOSIS — Z95811 Presence of heart assist device: Secondary | ICD-10-CM | POA: Diagnosis not present

## 2023-09-15 ENCOUNTER — Other Ambulatory Visit: Payer: Self-pay | Admitting: Cardiology

## 2023-09-17 ENCOUNTER — Encounter (HOSPITAL_COMMUNITY): Payer: 59 | Admitting: Internal Medicine

## 2023-09-17 ENCOUNTER — Encounter (HOSPITAL_COMMUNITY)
Admission: RE | Admit: 2023-09-17 | Discharge: 2023-09-17 | Disposition: A | Discharge status: Home / Self Care | Source: Ambulatory Visit | Attending: Internal Medicine

## 2023-09-17 DIAGNOSIS — Z95811 Presence of heart assist device: Secondary | ICD-10-CM | POA: Diagnosis not present

## 2023-09-17 NOTE — Progress Notes (Signed)
 Cardiac Individual Treatment Plan  Patient Details  Name: Joe [name] MRN: [id number] Date of Birth: 08/06/1970 Referring Provider:   Flowsheet Row INTENSIVE CARDIAC REHAB ORIENT from 08/30/2023 in [facility]  Referring Provider Arvilla Meres, MD       Initial Encounter Date:  Flowsheet Row INTENSIVE CARDIAC REHAB ORIENT from 08/30/2023 in [facility]  Date 08/30/23       Visit Diagnosis: 05/02/24 LVAD, HM3  Patient's Home Medications on Admission:  Current Outpatient Medications:    acetaminophen (TYLENOL) 500 MG tablet, Take 1,000 mg by mouth every 6 (six) hours as needed for moderate pain or headache., Disp: , Rfl:    allopurinol (ZYLOPRIM) 100 MG tablet, Take 100 mg by mouth in the morning., Disp: , Rfl:    amiodarone (PACERONE) 200 MG tablet, Take 1 tablet (200 mg total) by mouth daily., Disp: 30 tablet, Rfl: 5   colchicine 0.6 MG tablet, Take 0.6 mg by mouth daily as needed (Gout)., Disp: , Rfl:    diclofenac Sodium (VOLTAREN) 1 % GEL, APPLY 1 G TOPICALLY DAILY. (Patient taking differently: Apply 1 g topically 4 (four) times daily as needed (pain.).), Disp: 100 g, Rfl: 0   digoxin (LANOXIN) 0.125 MG tablet, Take 1 tablet (0.125 mg total) by mouth daily., Disp: 30 tablet, Rfl: 5   escitalopram (LEXAPRO) 10 MG tablet, Take 10 mg by mouth at bedtime., Disp: , Rfl:    ezetimibe (ZETIA) 10 MG tablet, Take 1 tablet (10 mg total) by mouth daily., Disp: 30 tablet, Rfl: 5   gabapentin (NEURONTIN) 300 MG capsule, Take 300 mg by mouth 3 (three) times daily., Disp: , Rfl:    levothyroxine (SYNTHROID) 25 MCG tablet, Take 25 mcg by mouth daily before breakfast., Disp: , Rfl:    metFORMIN (GLUCOPHAGE-XR) 500 MG 24 hr tablet, Take 1,000 mg by mouth in the morning and at bedtime., Disp: , Rfl:    mexiletine (MEXITIL) 250 MG capsule, Take 1 capsule (250 mg total) by  mouth 2 (two) times daily., Disp: 60 capsule, Rfl: 11   multivitamin (ONE-A-DAY MEN'S) TABS tablet, Take 1 tablet by mouth in the morning., Disp: , Rfl:    omeprazole (PRILOSEC) 40 MG capsule, Take 1 capsule (40 mg total) by mouth daily., Disp: 90 capsule, Rfl: 1   ondansetron (ZOFRAN-ODT) 4 MG disintegrating tablet, Take 1 tablet (4 mg total) by mouth every 8 (eight) hours as needed for nausea or vomiting., Disp: 20 tablet, Rfl: 0   OZEMPIC, 2 MG/DOSE, 8 MG/3ML SOPN, Inject 2 mg into the skin every Monday., Disp: , Rfl:    Polyethylene Glycol 400 (BLINK TEARS OP), Place 2 drops into both eyes 4 (four) times daily as needed (for dry eyes)., Disp: , Rfl:    potassium chloride SA (KLOR-CON M) 20 MEQ tablet, Take 3 tablets (60 mEq total) by mouth 2 (two) times daily., Disp: 540 tablet, Rfl: 1   rosuvastatin (CRESTOR) 40 MG tablet, Take 1 tablet (40 mg total) by mouth daily., Disp: 90 tablet, Rfl: 2   sildenafil (REVATIO) 20 MG tablet, Take 1 tablet (20 mg total) by mouth 3 (three) times daily., Disp: 90 tablet, Rfl: 5   sildenafil (VIAGRA) 50 MG tablet, Take 1 tablet (50 mg total) by mouth daily as needed for erectile dysfunction., Disp: 10 tablet, Rfl: 6   spironolactone (ALDACTONE) 25 MG tablet, Take 1 tablet (25 mg total) by mouth  daily., Disp: 30 tablet, Rfl: 5   torsemide (DEMADEX) 20 MG tablet, TAKE 2 TABLETS (40 MG TOTAL) BY MOUTH DAILY., Disp: 180 tablet, Rfl: 2   warfarin (COUMADIN) 5 MG tablet, Take 1.5 tablets (7.5 mg total) by mouth daily. (Patient taking differently: Take 7.5 mg by mouth every evening.), Disp: 60 tablet, Rfl: 5  Past Medical History: Past Medical History:  Diagnosis Date   Abnormal EKG    Acute systolic heart failure (HCC) 09/06/2017   Angina pectoris (HCC) 09/05/2017   Body mass index 45.0-49.9, adult (HCC)    CAD S/P percutaneous coronary angioplasty 09/07/2017   mLAD and dLAD PCI with DES 09/06/17   CHF (congestive heart failure) (HCC)    Chronic systolic  (congestive) heart failure (HCC) 03/19/2019   Complication of anesthesia 1995   "had hard time waking up; breathing w/vasectomy"   Coronary artery disease    Erectile dysfunction    Essential hypertension, benign    Fatigue    GERD (gastroesophageal reflux disease)    Gout    High cholesterol    "just started tx yesterday" (09/06/2017)   History of gout    "RX prn" (09/06/2017)   Ischemic cardiomyopathy 09/07/2017   EF 25-35%   Muscular chest pain    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   Below Left Nare (MOH's)   Obesity    Obstructive sleep apnea    OSA on CPAP    Plantar fasciitis    Pre-operative clearance 02/11/2018   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/02/2019   [employer]   Right bundle branch block    Shortness of breath 09/05/2017   Testosterone deficiency    Type 2 diabetes mellitus with complication, without long-term current use of insulin (HCC) 09/05/2017   Type II diabetes mellitus (HCC)    "started tx 08/28/2017"    Tobacco Use: Social History   Tobacco Use  Smoking Status Never  Smokeless Tobacco Never    Labs: Review Flowsheet  More data exists      Latest Ref Rng & Units 05/19/2023 05/20/2023 05/21/2023 07/19/2023 08/06/2023  Labs for ITP Cardiac and Pulmonary Rehab  Hemoglobin A1c 4.8 - 5.6 % - - - 9.0  -  Bicarbonate 20.0 - 28.0 mmol/L - - - - 29.7  30.8   TCO2 22 - 32 mmol/L - - - - 31  32   O2 Saturation % 65.8  56.7  60.2  - 61  64     Details       Multiple values from one day are sorted in reverse-chronological order         Capillary Blood Glucose: Lab Results  Component Value Date   GLUCAP 247 (H) 09/03/2023   GLUCAP 260 (H) 09/03/2023   GLUCAP 173 (H) 08/06/2023   GLUCAP 159 (H) 05/21/2023   GLUCAP 133 (H) 05/21/2023     Exercise Target Goals: Exercise Program Goal: Individual exercise prescription set using results from initial 6 min walk test and THRR while considering  patient's  activity barriers and safety.   Exercise Prescription Goal: Initial exercise prescription builds to 30-45 minutes a day of aerobic activity, 2-3 days per week.  Home exercise guidelines will be given to patient during program as part of exercise prescription that the participant will acknowledge.  Activity Barriers & Risk Stratification:   6 Minute Walk:  6 Minute Walk     Row Name 08/30/23 1144         6 Minute  Walk   Phase Initial     Distance 1440 feet     Walk Time 6 minutes     # of Rest Breaks 0     MPH 2.73     METS 2.89     RPE 13     Perceived Dyspnea  2     VO2 Peak 10.12     Symptoms Yes (comment)     Comments SOB, resolved with rest     Resting HR 77 bpm     Resting BP 84/0  MAP     Resting Oxygen Saturation  97 %     Exercise Oxygen Saturation  during 6 min walk 97 %     Max Ex. HR 100 bpm     Max Ex. BP 86/0  MAP     2 Minute Post BP 92/0  MAP              Oxygen Initial Assessment:   Oxygen Re-Evaluation:   Oxygen Discharge (Final Oxygen Re-Evaluation):   Initial Exercise Prescription:  Initial Exercise Prescription - 08/30/23 1100       Date of Initial Exercise RX and Referring Provider   Date 08/30/23    Referring Provider Arvilla Meres, MD    Expected Discharge Date 11/21/23      NuStep   Level 1    SPM 65    Minutes 15    METs 2.5      Track   Laps 10    Minutes 15    METs 2.5      Prescription Details   Frequency (times per week) 3    Duration Progress to 30 minutes of continuous aerobic without signs/symptoms of physical distress      Intensity   THRR 40-80% of Max Heartrate 67-134    Ratings of Perceived Exertion 11-13    Perceived Dyspnea 0-4      Progression   Progression Continue progressive overload as per policy without signs/symptoms or physical distress.      Resistance Training   Training Prescription Yes    Weight 3    Reps 10-15             Perform Capillary Blood Glucose checks as  needed.  Exercise Prescription Changes:   Exercise Prescription Changes     Row Name 09/03/23 1200 09/17/23 1021           Response to Exercise   Blood Pressure (Admit) --  86 MAP 102/0  MAP      Blood Pressure (Exercise) --  104 MAP 106/0  MAP      Blood Pressure (Exit) --  96 MAP 90/0  MAP      Heart Rate (Admit) 73 bpm 66 bpm      Heart Rate (Exercise) 109 bpm 104 bpm      Heart Rate (Exit) 81 bpm 75 bpm      Rating of Perceived Exertion (Exercise) 15 12      Symptoms None None      Comments Pt first day --      Duration Continue with 30 min of aerobic exercise without signs/symptoms of physical distress. Continue with 30 min of aerobic exercise without signs/symptoms of physical distress.      Intensity THRR unchanged THRR unchanged        Progression   Progression Continue to progress workloads to maintain intensity without signs/symptoms of physical distress. Continue to progress workloads to maintain intensity without signs/symptoms of physical  distress.      Average METs 2.2 2.6        Resistance Training   Training Prescription Yes Yes      Weight 3 3 lbs      Reps 10-15 10-15      Time -- 10 Minutes        Interval Training   Interval Training -- No        NuStep   Level 1 1      SPM 67 91      Minutes 15 15      METs 1.6 2.4        Track   Laps 14 15      Minutes 15 15      METs 2.79 2.91               Exercise Comments:   Exercise Comments     Row Name 09/03/23 1156           Exercise Comments Pt first full day in program. Pt completed 15 minutes of track walking and 15 minutes of the NuStep averaging 2.2 METs for his aerobic exercise. Pt reported an RPE of 15 on the NuStep at level 1 and was asked to decrease his pace. Pt completed resistance exercise with 3lb weights and cool-down without unusual signs or symptoms. Telemetry rhythm unchanged from orientation.                Exercise Goals and Review:   Exercise Goals     Row  Name 08/30/23 0929             Exercise Goals   Increase Physical Activity Yes       Intervention Provide advice, education, support and counseling about physical activity/exercise needs.;Develop an individualized exercise prescription for aerobic and resistive training based on initial evaluation findings, risk stratification, comorbidities and participant's personal goals.       Expected Outcomes Long Term: Exercising regularly at least 3-5 days a week.;Short Term: Attend rehab on a regular basis to increase amount of physical activity.;Long Term: Add in home exercise to make exercise part of routine and to increase amount of physical activity.       Increase Strength and Stamina Yes       Intervention Develop an individualized exercise prescription for aerobic and resistive training based on initial evaluation findings, risk stratification, comorbidities and participant's personal goals.;Provide advice, education, support and counseling about physical activity/exercise needs.       Expected Outcomes Short Term: Increase workloads from initial exercise prescription for resistance, speed, and METs.;Short Term: Perform resistance training exercises routinely during rehab and add in resistance training at home;Long Term: Improve cardiorespiratory fitness, muscular endurance and strength as measured by increased METs and functional capacity ( )       Able to understand and use rate of perceived exertion (RPE) scale Yes       Intervention Provide education and explanation on how to use RPE scale       Expected Outcomes Short Term: Able to use RPE daily in rehab to express subjective intensity level;Long Term:  Able to use RPE to guide intensity level when exercising independently       Knowledge and understanding of Target Heart Rate Range (THRR) Yes       Intervention Provide education and explanation of THRR including how the numbers were predicted and where they are located for reference        Expected Outcomes Short Term:  Able to state/look up THRR;Short Term: Able to use daily as guideline for intensity in rehab;Long Term: Able to use THRR to govern intensity when exercising independently       Understanding of Exercise Prescription Yes       Intervention Provide education, explanation, and written materials on patient's individual exercise prescription       Expected Outcomes Short Term: Able to explain program exercise prescription;Long Term: Able to explain home exercise prescription to exercise independently                Exercise Goals Re-Evaluation :  Exercise Goals Re-Evaluation     Row Name 09/07/23 1450             Exercise Goal Re-Evaluation   Exercise Goals Review Increase Physical Activity;Increase Strength and Stamina;Able to understand and use rate of perceived exertion (RPE) scale       Comments Seanmichael is able to understand and use RPE scale appropriately.       Expected Outcomes Progress workloads as tolerated to help increase strength and stamina.                Discharge Exercise Prescription (Final Exercise Prescription Changes):  Exercise Prescription Changes - 09/17/23 1021       Response to Exercise   Blood Pressure (Admit) 102/0   MAP   Blood Pressure (Exercise) 106/0   MAP   Blood Pressure (Exit) 90/0   MAP   Heart Rate (Admit) 66 bpm    Heart Rate (Exercise) 104 bpm    Heart Rate (Exit) 75 bpm    Rating of Perceived Exertion (Exercise) 12    Symptoms None    Duration Continue with 30 min of aerobic exercise without signs/symptoms of physical distress.    Intensity THRR unchanged      Progression   Progression Continue to progress workloads to maintain intensity without signs/symptoms of physical distress.    Average METs 2.6      Resistance Training   Training Prescription Yes    Weight 3 lbs    Reps 10-15    Time 10 Minutes      Interval Training   Interval Training No      NuStep   Level 1    SPM 91    Minutes 15     METs 2.4      Track   Laps 15    Minutes 15    METs 2.91             Nutrition:  Target Goals: Understanding of nutrition guidelines, daily intake of sodium 1500mg , cholesterol 200mg , calories 30% from fat and 7% or less from saturated fats, daily to have 5 or more servings of fruits and vegetables.  Biometrics:  Pre Biometrics - 08/30/23 1143       Pre Biometrics   Waist Circumference 53.5 inches    Hip Circumference 47 inches    Waist to Hip Ratio 1.14 %    Triceps Skinfold 42 mm    % Body Fat 42 %    Grip Strength 40 kg    Flexibility 0 in   not done, low back issues   Single Leg Stand 7.12 seconds              Nutrition Therapy Plan and Nutrition Goals:  Nutrition Therapy & Goals - 09/04/23 1147       Nutrition Therapy   Diet Heart Healthy/carbohydrate consistent diet    Drug/Food Interactions Statins/Certain Fruits;Coumadin/Vit  K      Personal Nutrition Goals   Nutrition Goal Patient to improve diet quality by using the plate method as a guide for meal planning to include lean protein/plant protein, fruits, vegetables, whole grains, nonfat dairy as part of a well-balanced diet.    Personal Goal #4 --    Comments Landon has medical history of CAD, LVAD, DM2, OSA, CHF, HTN, hyperlipidemia. He is unable to be listed for transplant through [facility] until he loses ~25# with goal being BMI <35. His A1c is not well controlled. Patient will benefit from participation in intensive cardiac rehab for nutrition, exercise, and lifestyle modification.      Intervention Plan   Intervention Prescribe, educate and counsel regarding individualized specific dietary modifications aiming towards targeted core components such as weight, hypertension, lipid management, diabetes, heart failure and other comorbidities.;Nutrition handout(s) given to patient.    Expected Outcomes Short Term Goal: Understand basic principles of dietary content, such as calories, fat, sodium,  cholesterol and nutrients.;Long Term Goal: Adherence to prescribed nutrition plan.             Nutrition Assessments:  MEDIFICTS Score Key: >=70 Need to make dietary changes  40-70 Heart Healthy Diet <= 40 Therapeutic Level Cholesterol Diet    Picture Your Plate Scores: <96 Unhealthy dietary pattern with much room for improvement. 41-50 Dietary pattern unlikely to meet recommendations for good health and room for improvement. 51-60 More healthful dietary pattern, with some room for improvement.  >60 Healthy dietary pattern, although there may be some specific behaviors that could be improved.    Nutrition Goals Re-Evaluation:  Nutrition Goals Re-Evaluation     Row Name 09/04/23 1147             Goals   Current Weight 327 lb 13.2 oz (148.7 kg)       Comment A1c 9.0, LDL 104 (crestor, zetia).He continues regular follow-up with anti-coag clinic       Expected Outcome Witt has medical history of CAD, LVAD, DM2, OSA, CHF, HTN, hyperlipidemia. He is unable to be listed for transplant through [facility] until he loses ~25# with goal being BMI <35. His A1c is not well controlled. He is taking ozempic but reports that he is not feeling as much satiety as this point. He has previously participated in cardiac rehab in 2019. Patient will benefit from participation in intensive cardiac rehab for nutrition, exercise, and lifestyle modification.                Nutrition Goals Re-Evaluation:  Nutrition Goals Re-Evaluation     Row Name 09/04/23 1147             Goals   Current Weight 327 lb 13.2 oz (148.7 kg)       Comment A1c 9.0, LDL 104 (crestor, zetia).He continues regular follow-up with anti-coag clinic       Expected Outcome Gildo has medical history of CAD, LVAD, DM2, OSA, CHF, HTN, hyperlipidemia. He is unable to be listed for transplant through [facility] until he loses ~25# with goal being BMI <35. His A1c is not well controlled. He is taking ozempic but reports that he is  not feeling as much satiety as this point. He has previously participated in cardiac rehab in 2019. Patient will benefit from participation in intensive cardiac rehab for nutrition, exercise, and lifestyle modification.                Nutrition Goals Discharge (Final Nutrition Goals Re-Evaluation):  Nutrition Goals  Re-Evaluation - 09/04/23 1147       Goals   Current Weight 327 lb 13.2 oz (148.7 kg)    Comment A1c 9.0, LDL 104 (crestor, zetia).He continues regular follow-up with anti-coag clinic    Expected Outcome Jeury has medical history of CAD, LVAD, DM2, OSA, CHF, HTN, hyperlipidemia. He is unable to be listed for transplant through [facility] until he loses ~25# with goal being BMI <35. His A1c is not well controlled. He is taking ozempic but reports that he is not feeling as much satiety as this point. He has previously participated in cardiac rehab in 2019. Patient will benefit from participation in intensive cardiac rehab for nutrition, exercise, and lifestyle modification.             Psychosocial: Target Goals: Acknowledge presence or absence of significant depression and/or stress, maximize coping skills, provide positive support system. Participant is able to verbalize types and ability to use techniques and skills needed for reducing stress and depression.  Initial Review & Psychosocial Screening:  Initial Psych Review & Screening - 08/30/23 0938       Initial Review   Current issues with History of Depression;Current Stress Concerns;Current Depression    Source of Stress Concerns Chronic Illness;Financial;Unable to perform yard/household activities;Unable to participate in former interests or hobbies    Comments having heart failure and the LVAD has been a lifechanging event for Clerance. Jamiel cant go on the transplant list until he looses weight. Saman admits to [employer] low energy since hospitalization. Ethaniel has not been able to work since November 2022 and has  applied for disability/ social security. Braylan has no income in and has to rely on wife. Gaylin is currently depressed and is taking an antidepressant that is currently controlling his depression. Mirza is not interested in counseling at this time. Frederico does meet with LVAD support group once a month.      Family Dynamics   Good Support System? Yes   Kinsey has his wife, his son and stepdaughter and neighbors for support.   Comments will continue to monitor and offer support as needed      Barriers   Psychosocial barriers to participate in program The patient should benefit from training in stress management and relaxation.      Screening Interventions   Interventions Encouraged to exercise;To provide support and resources with identified psychosocial needs;Provide feedback about the scores to participant    Expected Outcomes Long Term Goal: Stressors or current issues are controlled or eliminated.             Quality of Life Scores:  Quality of Life - 08/30/23 1158       Quality of Life   Select Quality of Life      Quality of Life Scores   Health/Function Pre 16.4 %    Socioeconomic Pre 23.79 %    Psych/Spiritual Pre 22.29 %    Family Pre 27.6 %    GLOBAL Pre 20.78 %            Scores of 19 and below usually indicate a poorer quality of life in these areas.  A difference of  2-3 points is a clinically meaningful difference.  A difference of 2-3 points in the total score of the Quality of Life Index has been associated with significant improvement in overall quality of life, self-image, physical symptoms, and general health in studies assessing change in quality of life.  PHQ-9: Review Flowsheet  08/30/2023  Depression screen PHQ 2/9  Decreased Interest 0  Down, Depressed, Hopeless 1  PHQ - 2 Score 1  Altered sleeping 0  Tired, decreased energy 3  Change in appetite 0  Feeling bad or failure about yourself  2  Trouble concentrating 1  Moving slowly  or fidgety/restless 0  Suicidal thoughts 0  PHQ-9 Score 7  Difficult doing work/chores Very difficult   Interpretation of Total Score  Total Score Depression Severity:  1-4 = Minimal depression, 5-9 = Mild depression, 10-14 = Moderate depression, 15-19 = Moderately severe depression, 20-27 = Severe depression   Psychosocial Evaluation and Intervention:   Psychosocial Re-Evaluation:  Psychosocial Re-Evaluation     Row Name 09/17/23 1719             Psychosocial Re-Evaluation   Current issues with Current Depression;Current Stress Concerns;History of Depression       Comments Quality of life reviewed on 09/03/23.Rolondo admits to having some shortness of breath and a low energy level. Giannis is in the process of applying for disability. Heman says he worries about finances. Argenis hopes to be put on the heart transplant list if he can loose the required weight. Marlos admits to being depressed at times due to heart failure diagnosis and recent LVAD placement. Random denies the need for counseling at this time.       Expected Outcomes Ismar will have controlled or decreased depression upon completion of cardiac rehab       Interventions Stress management education;Encouraged to attend Cardiac Rehabilitation for the exercise;Relaxation education       Continue Psychosocial Services  Follow up required by staff         Initial Review   Source of Stress Concerns Chronic Illness;Financial;Unable to participate in former interests or hobbies;Unable to perform yard/household activities       Comments Will continue to monitor and offer support as needed                Psychosocial Discharge (Final Psychosocial Re-Evaluation):  Psychosocial Re-Evaluation - 09/17/23 1719       Psychosocial Re-Evaluation   Current issues with Current Depression;Current Stress Concerns;History of Depression    Comments Quality of life reviewed on 09/03/23.Keniel admits to having some shortness  of breath and a low energy level. Aadyn is in the process of applying for disability. Zakye says he worries about finances. Devaunte hopes to be put on the heart transplant list if he can loose the required weight. Taurus admits to being depressed at times due to heart failure diagnosis and recent LVAD placement. Khrystian denies the need for counseling at this time.    Expected Outcomes Jahmad will have controlled or decreased depression upon completion of cardiac rehab    Interventions Stress management education;Encouraged to attend Cardiac Rehabilitation for the exercise;Relaxation education    Continue Psychosocial Services  Follow up required by staff      Initial Review   Source of Stress Concerns Chronic Illness;Financial;Unable to participate in former interests or hobbies;Unable to perform yard/household activities    Comments Will continue to monitor and offer support as needed             Vocational Rehabilitation: Provide vocational rehab assistance to qualifying candidates.   Vocational Rehab Evaluation & Intervention:  Vocational Rehab - 08/30/23 1039       Initial Vocational Rehab Evaluation & Intervention   Assessment shows need for Vocational Rehabilitation No   Tito is currently unemployed and is in  the process of applying for social security disability. Nicandro is not intersted in vocational rehab at this time.            Education: Education Goals: Education classes will be provided on a weekly basis, covering required topics. Participant will state understanding/return demonstration of topics presented.    Education     Row Name 09/03/23 1500     Education   Cardiac Education Topics Pritikin   Glass blower/designer Nutrition   Nutrition Workshop Targeting Your Nutrition Priorities   Instruction Review Code 1- Verbalizes Understanding   Class Start Time 1150   Class Stop Time 1222   Class Time  Calculation (min) 32 min    Row Name 09/05/23 1500     Education   Cardiac Education Topics Pritikin   Orthoptist   Educator Dietitian   Weekly Topic One-Pot Wonders   Instruction Review Code 1- Verbalizes Understanding   Class Start Time 1145   Class Stop Time 1227   Class Time Calculation (min) 42 min    Row Name 09/07/23 1100     Education   Cardiac Education Topics Pritikin   Psychologist, forensic General Education   General Education Hypertension and Heart Disease   Instruction Review Code 1- Verbalizes Understanding   Class Start Time 1144   Class Stop Time 1217   Class Time Calculation (min) 33 min    Row Name 09/17/23 1100     Education   Cardiac Education Topics Pritikin   Select Core Videos     Core Videos   Educator Exercise Physiologist   Select Exercise Education   Exercise Education Biomechanial Limitations   Instruction Review Code 1- Verbalizes Understanding   Class Start Time 1145   Class Stop Time 1218   Class Time Calculation (min) 33 min            Core Videos: Exercise    Move It!  Clinical staff conducted group or individual video education with verbal and written material and guidebook.  Patient learns the recommended Pritikin exercise program. Exercise with the goal of living a long, healthy life. Some of the health benefits of exercise include controlled diabetes, healthier blood pressure levels, improved cholesterol levels, improved heart and lung capacity, improved sleep, and better body composition. Everyone should speak with their doctor before starting or changing an exercise routine.  Biomechanical Limitations Clinical staff conducted group or individual video education with verbal and written material and guidebook.  Patient learns how biomechanical limitations can impact exercise and how we can mitigate and possibly overcome limitations to  have an impactful and balanced exercise routine.  Body Composition Clinical staff conducted group or individual video education with verbal and written material and guidebook.  Patient learns that body composition (ratio of muscle mass to fat mass) is a key component to assessing overall fitness, rather than body weight alone. Increased fat mass, especially visceral belly fat, can put [place] at increased risk for metabolic syndrome, type 2 diabetes, heart disease, and even death. It is recommended to combine diet and exercise (cardiovascular and resistance training) to improve your body composition. Seek guidance from your physician and exercise physiologist before implementing an exercise routine.  Exercise Action Plan Clinical staff conducted group or individual video education with verbal and written material and guidebook.  Patient learns the recommended strategies to achieve and enjoy long-term exercise adherence, including variety, self-motivation, self-efficacy, and positive decision making. Benefits of exercise include fitness, good health, weight management, more energy, better sleep, less stress, and overall well-being.  Medical   Heart Disease Risk Reduction Clinical staff conducted group or individual video education with verbal and written material and guidebook.  Patient learns our heart is our most vital organ as it circulates oxygen, nutrients, white blood cells, and hormones throughout the entire body, and carries waste away. Data supports a plant-based eating plan like the Pritikin Program for its effectiveness in slowing progression of and reversing heart disease. The video provides a number of recommendations to address heart disease.   Metabolic Syndrome and Belly Fat  Clinical staff conducted group or individual video education with verbal and written material and guidebook.  Patient learns what metabolic syndrome is, how it leads to heart disease, and how one can reverse it and  keep it from coming back. You have metabolic syndrome if you have 3 of the following 5 criteria: abdominal obesity, high blood pressure, high triglycerides, low HDL cholesterol, and high blood sugar.  Hypertension and Heart Disease Clinical staff conducted group or individual video education with verbal and written material and guidebook.  Patient learns that high blood pressure, or hypertension, is very common in the [place]. Hypertension is largely due to excessive salt intake, but other important risk factors include being overweight, physical inactivity, drinking too much alcohol, smoking, and not eating enough potassium from fruits and vegetables. High blood pressure is a leading risk factor for heart attack, stroke, congestive heart failure, dementia, kidney failure, and premature death. Long-term effects of excessive salt intake include stiffening of the arteries and thickening of heart muscle and organ damage. Recommendations include ways to reduce hypertension and the risk of heart disease.  Diseases of Our Time - Focusing on Diabetes Clinical staff conducted group or individual video education with verbal and written material and guidebook.  Patient learns why the best way to stop diseases of our time is prevention, through food and other lifestyle changes. Medicine (such as prescription pills and surgeries) is often only a Band-Aid on the problem, not a long-term solution. Most common diseases of our time include obesity, type 2 diabetes, hypertension, heart disease, and cancer. The Pritikin Program is recommended and has been proven to help reduce, reverse, and/or prevent the damaging effects of metabolic syndrome.  Nutrition   Overview of the Pritikin Eating Plan  Clinical staff conducted group or individual video education with verbal and written material and guidebook.  Patient learns about the Pritikin Eating Plan for disease risk reduction. The Pritikin Eating Plan emphasizes a  wide variety of unrefined, minimally-processed carbohydrates, like fruits, vegetables, whole grains, and legumes. Go, Caution, and Stop food choices are explained. Plant-based and lean animal proteins are emphasized. Rationale provided for low sodium intake for blood pressure control, low added sugars for blood sugar stabilization, and low added fats and oils for coronary artery disease risk reduction and weight management.  Calorie Density  Clinical staff conducted group or individual video education with verbal and written material and guidebook.  Patient learns about calorie density and how it impacts the Pritikin Eating Plan. Knowing the characteristics of the food you choose will help you decide whether those foods will lead to weight gain or weight loss, and whether you want to consume more or less of them. Weight loss is usually a side effect of the Pritikin  Eating Plan because of its focus on low calorie-dense foods.  Label Reading  Clinical staff conducted group or individual video education with verbal and written material and guidebook.  Patient learns about the Pritikin recommended label reading guidelines and corresponding recommendations regarding calorie density, added sugars, sodium content, and whole grains.  Dining Out - Part 1  Clinical staff conducted group or individual video education with verbal and written material and guidebook.  Patient learns that restaurant meals can be sabotaging because they can be so high in calories, fat, sodium, and/or sugar. Patient learns recommended strategies on how to positively address this and avoid unhealthy pitfalls.  Facts on Fats  Clinical staff conducted group or individual video education with verbal and written material and guidebook.  Patient learns that lifestyle modifications can be just as effective, if not more so, as many medications for lowering your risk of heart disease. A Pritikin lifestyle can help to reduce your risk of  inflammation and atherosclerosis (cholesterol build-up, or plaque, in the artery walls). Lifestyle interventions such as dietary choices and physical activity address the cause of atherosclerosis. A review of the types of fats and their impact on blood cholesterol levels, along with dietary recommendations to reduce fat intake is also included.  Nutrition Action Plan  Clinical staff conducted group or individual video education with verbal and written material and guidebook.  Patient learns how to incorporate Pritikin recommendations into their lifestyle. Recommendations include planning and keeping personal health goals in mind as an important part of their success.  Healthy Mind-Set    Healthy Minds, Bodies, Hearts  Clinical staff conducted group or individual video education with verbal and written material and guidebook.  Patient learns how to identify when they are stressed. Video will discuss the impact of that stress, as well as the many benefits of stress management. Patient will also be introduced to stress management techniques. The way we think, act, and feel has an impact on our hearts.  How Our Thoughts Can Heal Our Hearts  Clinical staff conducted group or individual video education with verbal and written material and guidebook.  Patient learns that negative thoughts can cause depression and anxiety. This can result in negative lifestyle behavior and serious health problems. Cognitive behavioral therapy is an effective method to help control our thoughts in order to change and improve our emotional outlook.  Additional Videos:  Exercise    Improving Performance  Clinical staff conducted group or individual video education with verbal and written material and guidebook.  Patient learns to use a non-linear approach by alternating intensity levels and lengths of time spent exercising to help burn more calories and lose more body fat. Cardiovascular exercise helps improve heart health,  metabolism, hormonal balance, blood sugar control, and recovery from fatigue. Resistance training improves strength, endurance, balance, coordination, reaction time, metabolism, and muscle mass. Flexibility exercise improves circulation, posture, and balance. Seek guidance from your physician and exercise physiologist before implementing an exercise routine and learn your capabilities and proper form for all exercise.  Introduction to Yoga  Clinical staff conducted group or individual video education with verbal and written material and guidebook.  Patient learns about yoga, a discipline of the coming together of mind, breath, and body. The benefits of yoga include improved flexibility, improved range of motion, better posture and core strength, increased lung function, weight loss, and positive self-image. Yoga's heart health benefits include lowered blood pressure, healthier heart rate, decreased cholesterol and triglyceride levels, improved immune function, and reduced  stress. Seek guidance from your physician and exercise physiologist before implementing an exercise routine and learn your capabilities and proper form for all exercise.  Medical   Aging: Enhancing Your Quality of Life  Clinical staff conducted group or individual video education with verbal and written material and guidebook.  Patient learns key strategies and recommendations to stay in good physical health and enhance quality of life, such as prevention strategies, having an advocate, securing a Health Care Proxy and Power of Attorney, and keeping a list of medications and system for tracking them. It also discusses how to avoid risk for bone loss.  Biology of Weight Control  Clinical staff conducted group or individual video education with verbal and written material and guidebook.  Patient learns that weight gain occurs because we consume more calories than we burn (eating more, moving less). Even if your body weight is normal, you  may have higher ratios of fat compared to muscle mass. Too much body fat puts you at increased risk for cardiovascular disease, heart attack, stroke, type 2 diabetes, and obesity-related cancers. In addition to exercise, following the Pritikin Eating Plan can help reduce your risk.  Decoding Lab Results  Clinical staff conducted group or individual video education with verbal and written material and guidebook.  Patient learns that lab test reflects one measurement whose values change over time and are influenced by many factors, including medication, stress, sleep, exercise, food, hydration, pre-existing medical conditions, and more. It is recommended to use the knowledge from this video to become more involved with your lab results and evaluate your numbers to speak with your doctor.   Diseases of Our Time - Overview  Clinical staff conducted group or individual video education with verbal and written material and guidebook.  Patient learns that according to the CDC, 50% to 70% of chronic diseases (such as obesity, type 2 diabetes, elevated lipids, hypertension, and heart disease) are avoidable through lifestyle improvements including healthier food choices, listening to satiety cues, and increased physical activity.  Sleep Disorders Clinical staff conducted group or individual video education with verbal and written material and guidebook.  Patient learns how good quality and duration of sleep are important to overall health and well-being. Patient also learns about sleep disorders and how they impact health along with recommendations to address them, including discussing with a physician.  Nutrition  Dining Out - Part 2 Clinical staff conducted group or individual video education with verbal and written material and guidebook.  Patient learns how to plan ahead and communicate in order to maximize their dining experience in a healthy and nutritious manner. Included are recommended food choices  based on the type of restaurant the patient is visiting.   Fueling a Banker conducted group or individual video education with verbal and written material and guidebook.  There is a strong connection between our food choices and our health. Diseases like obesity and type 2 diabetes are very prevalent and are in large-part due to lifestyle choices. The Pritikin Eating Plan provides plenty of food and hunger-curbing satisfaction. It is easy to follow, affordable, and helps reduce health risks.  Menu Workshop  Clinical staff conducted group or individual video education with verbal and written material and guidebook.  Patient learns that restaurant meals can sabotage health goals because they are often packed with calories, fat, sodium, and sugar. Recommendations include strategies to plan ahead and to communicate with the manager, chef, or server to help order a healthier meal.  Planning Your Eating Strategy  Clinical staff conducted group or individual video education with verbal and written material and guidebook.  Patient learns about the Pritikin Eating Plan and its benefit of reducing the risk of disease. The Pritikin Eating Plan does not focus on calories. Instead, it emphasizes high-quality, nutrient-rich foods. By knowing the characteristics of the foods, we choose, we can determine their calorie density and make informed decisions.  Targeting Your Nutrition Priorities  Clinical staff conducted group or individual video education with verbal and written material and guidebook.  Patient learns that lifestyle habits have a tremendous impact on disease risk and progression. This video provides eating and physical activity recommendations based on your personal health goals, such as reducing LDL cholesterol, losing weight, preventing or controlling type 2 diabetes, and reducing high blood pressure.  Vitamins and Minerals  Clinical staff conducted group or individual video  education with verbal and written material and guidebook.  Patient learns different ways to obtain key vitamins and minerals, including through a recommended healthy diet. It is important to discuss all supplements you take with your doctor.   Healthy Mind-Set    Smoking Cessation  Clinical staff conducted group or individual video education with verbal and written material and guidebook.  Patient learns that cigarette smoking and tobacco addiction pose a serious health risk which affects millions of people. Stopping smoking will significantly reduce the risk of heart disease, lung disease, and many forms of cancer. Recommended strategies for quitting are covered, including working with your doctor to develop a successful plan.  Culinary   Becoming a Set designer conducted group or individual video education with verbal and written material and guidebook.  Patient learns that cooking at home can be healthy, cost-effective, quick, and puts them in control. Keys to cooking healthy recipes will include looking at your recipe, assessing your equipment needs, planning ahead, making it simple, choosing cost-effective seasonal ingredients, and limiting the use of added fats, salts, and sugars.  Cooking - Breakfast and Snacks  Clinical staff conducted group or individual video education with verbal and written material and guidebook.  Patient learns how important breakfast is to satiety and nutrition through the entire day. Recommendations include key foods to eat during breakfast to help stabilize blood sugar levels and to prevent overeating at meals later in the day. Planning ahead is also a key component.  Cooking - Educational psychologist conducted group or individual video education with verbal and written material and guidebook.  Patient learns eating strategies to improve overall health, including an approach to cook more at home. Recommendations include thinking of  animal protein as a side on your plate rather than center stage and focusing instead on lower calorie dense options like vegetables, fruits, whole grains, and plant-based proteins, such as beans. Making sauces in large quantities to freeze for later and leaving the skin on your vegetables are also recommended to maximize your experience.  Cooking - Healthy Salads and Dressing Clinical staff conducted group or individual video education with verbal and written material and guidebook.  Patient learns that vegetables, fruits, whole grains, and legumes are the foundations of the Pritikin Eating Plan. Recommendations include how to incorporate each of these in flavorful and healthy salads, and how to create homemade salad dressings. Proper handling of ingredients is also covered. Cooking - Soups and [employer] - Soups and Desserts Clinical staff conducted group or individual video education with verbal and written material and guidebook.  Patient learns that Pritikin soups and desserts make for easy, nutritious, and delicious snacks and meal components that are low in sodium, fat, sugar, and calorie density, while high in vitamins, minerals, and filling fiber. Recommendations include simple and healthy ideas for soups and desserts.   Overview     The Pritikin Solution Program Overview Clinical staff conducted group or individual video education with verbal and written material and guidebook.  Patient learns that the results of the Pritikin Program have been documented in more than 100 articles published in peer-reviewed journals, and the benefits include reducing risk factors for (and, in some cases, even reversing) high cholesterol, high blood pressure, type 2 diabetes, obesity, and more! An overview of the three key pillars of the Pritikin Program will be covered: eating well, doing regular exercise, and having a healthy mind-set.  WORKSHOPS  Exercise: Exercise Basics: Building Your Action  Plan Clinical staff led group instruction and group discussion with PowerPoint presentation and patient guidebook. To enhance the learning environment the use of posters, models and videos may be added. At the conclusion of this workshop, patients will comprehend the difference between physical activity and exercise, as well as the benefits of incorporating both, into their routine. Patients will understand the FITT (Frequency, Intensity, Time, and Type) principle and how to use it to build an exercise action plan. In addition, safety concerns and other considerations for exercise and cardiac rehab will be addressed by the presenter. The purpose of this lesson is to promote a comprehensive and effective weekly exercise routine in order to improve patients' overall level of fitness.   Managing Heart Disease: Your Path to a Healthier Heart Clinical staff led group instruction and group discussion with PowerPoint presentation and patient guidebook. To enhance the learning environment the use of posters, models and videos may be added.At the conclusion of this workshop, patients will understand the anatomy and physiology of the heart. Additionally, they will understand how Pritikin's three pillars impact the risk factors, the progression, and the management of heart disease.  The purpose of this lesson is to provide a high-level overview of the heart, heart disease, and how the Pritikin lifestyle positively impacts risk factors.  Exercise Biomechanics Clinical staff led group instruction and group discussion with PowerPoint presentation and patient guidebook. To enhance the learning environment the use of posters, models and videos may be added. Patients will learn how the structural parts of their bodies function and how these functions impact their daily activities, movement, and exercise. Patients will learn how to promote a neutral spine, learn how to manage pain, and identify ways to improve  their physical movement in order to promote healthy living. The purpose of this lesson is to expose patients to common physical limitations that impact physical activity. Participants will learn practical ways to adapt and manage aches and pains, and to minimize their effect on regular exercise. Patients will learn how to maintain good posture while sitting, walking, and lifting.  Balance Training and Fall Prevention  Clinical staff led group instruction and group discussion with PowerPoint presentation and patient guidebook. To enhance the learning environment the use of posters, models and videos may be added. At the conclusion of this workshop, patients will understand the importance of their sensorimotor skills (vision, proprioception, and the vestibular system) in maintaining their ability to balance as they age. Patients will apply a variety of balancing exercises that are appropriate for their current level of function. Patients will understand the common causes for poor balance,  possible solutions to these problems, and ways to modify their physical environment in order to minimize their fall risk. The purpose of this lesson is to teach patients about the importance of maintaining balance as they age and ways to minimize their risk of falling.  WORKSHOPS   Nutrition:  Fueling a Ship broker led group instruction and group discussion with PowerPoint presentation and patient guidebook. To enhance the learning environment the use of posters, models and videos may be added. Patients will review the foundational principles of the Pritikin Eating Plan and understand what constitutes a serving size in each of the food groups. Patients will also learn Pritikin-friendly foods that are better choices when away from home and review make-ahead meal and snack options. Calorie density will be reviewed and applied to three nutrition priorities: weight maintenance, weight loss, and weight  gain. The purpose of this lesson is to reinforce (in a group setting) the key concepts around what patients are recommended to eat and how to apply these guidelines when away from home by planning and selecting Pritikin-friendly options. Patients will understand how calorie density may be adjusted for different weight management goals.  Mindful Eating  Clinical staff led group instruction and group discussion with PowerPoint presentation and patient guidebook. To enhance the learning environment the use of posters, models and videos may be added. Patients will briefly review the concepts of the Pritikin Eating Plan and the importance of low-calorie dense foods. The concept of mindful eating will be introduced as well as the importance of paying attention to internal hunger signals. Triggers for non-hunger eating and techniques for dealing with triggers will be explored. The purpose of this lesson is to provide patients with the opportunity to review the basic principles of the Pritikin Eating Plan, discuss the value of eating mindfully and how to measure internal cues of hunger and fullness using the Hunger Scale. Patients will also discuss reasons for non-hunger eating and learn strategies to use for controlling emotional eating.  Targeting Your Nutrition Priorities Clinical staff led group instruction and group discussion with PowerPoint presentation and patient guidebook. To enhance the learning environment the use of posters, models and videos may be added. Patients will learn how to determine their genetic susceptibility to disease by reviewing their family history. Patients will gain insight into the importance of diet as part of an overall healthy lifestyle in mitigating the impact of genetics and other environmental insults. The purpose of this lesson is to provide patients with the opportunity to assess their personal nutrition priorities by looking at their family history, their own health history  and current risk factors. Patients will also be able to discuss ways of prioritizing and modifying the Pritikin Eating Plan for their highest risk areas  Menu  Clinical staff led group instruction and group discussion with PowerPoint presentation and patient guidebook. To enhance the learning environment the use of posters, models and videos may be added. Using menus brought in from [employer], or printed from [employer], patients will apply the Pritikin dining out guidelines that were presented in the [employer] video. Patients will also be able to practice these guidelines in a variety of provided scenarios. The purpose of this lesson is to provide patients with the opportunity to practice hands-on learning of the Pritikin Dining Out guidelines with actual menus and practice scenarios.  Label Reading Clinical staff led group instruction and group discussion with PowerPoint presentation and patient guidebook. To enhance the learning environment  the use of posters, models and videos may be added. Patients will review and discuss the Pritikin label reading guidelines presented in Pritikin's Label Reading Educational series video. Using fool labels brought in from local grocery stores and markets, patients will apply the label reading guidelines and determine if the packaged food meet the Pritikin guidelines. The purpose of this lesson is to provide patients with the opportunity to review, discuss, and practice hands-on learning of the Pritikin Label Reading guidelines with actual packaged food labels. Cooking School  Pritikin's [employer] are designed to teach patients ways to prepare quick, simple, and affordable recipes at home. The importance of nutrition's role in chronic disease risk reduction is reflected in its emphasis in the overall Pritikin program. By learning how to prepare essential core Pritikin Eating Plan recipes, patients will increase control over  what they eat; be able to customize the flavor of foods without the use of added salt, sugar, or fat; and improve the quality of the food they consume. By learning a set of core recipes which are easily assembled, quickly prepared, and affordable, patients are more likely to prepare more healthy foods at home. These workshops focus on convenient breakfasts, simple entres, side dishes, and desserts which can be prepared with minimal effort and are consistent with nutrition recommendations for cardiovascular risk reduction. Cooking [employer] are taught by a Armed forces logistics/support/administrative officer (RD) who has been trained by the [employer]. The chef or RD has a clear understanding of the importance of minimizing - if not completely eliminating - added fat, sugar, and sodium in recipes. Throughout the series of Cooking School Workshop sessions, patients will learn about healthy ingredients and efficient methods of cooking to build confidence in their capability to prepare    Cooking School weekly topics:  Adding Flavor- Sodium-Free  Fast and Healthy Breakfasts  Powerhouse Plant-Based Proteins  Satisfying Salads and Dressings  Simple Sides and Sauces  International Cuisine-Spotlight on the [employer] Zones  Delicious Desserts  Savory Soups  [employer] - Meals in a Astronomer Appetizers and Snacks  Comforting Weekend Breakfasts  One-Pot Wonders   Fast Evening Meals  Landscape architect Your Pritikin Plate  WORKSHOPS   Healthy Mindset (Psychosocial):  Focused Goals, Sustainable Changes Clinical staff led group instruction and group discussion with PowerPoint presentation and patient guidebook. To enhance the learning environment the use of posters, models and videos may be added. Patients will be able to apply effective goal setting strategies to establish at least one personal goal, and then take consistent, meaningful action toward that goal. They will learn to  identify common barriers to achieving personal goals and develop strategies to overcome them. Patients will also gain an understanding of how our mind-set can impact our ability to achieve goals and the importance of cultivating a positive and growth-oriented mind-set. The purpose of this lesson is to provide patients with a deeper understanding of how to set and achieve personal goals, as well as the tools and strategies needed to overcome common obstacles which may arise along the way.  From Head to Heart: The Power of a Healthy Outlook  Clinical staff led group instruction and group discussion with PowerPoint presentation and patient guidebook. To enhance the learning environment the use of posters, models and videos may be added. Patients will be able to recognize and describe the impact of emotions and mood on physical health. They will discover the importance of self-care and explore self-care practices  which may work for them. Patients will also learn how to utilize the 4 C's to cultivate a healthier outlook and better manage stress and challenges. The purpose of this lesson is to demonstrate to patients how a healthy outlook is an essential part of maintaining good health, especially as they continue their cardiac rehab journey.  Healthy Sleep for a Healthy Heart Clinical staff led group instruction and group discussion with PowerPoint presentation and patient guidebook. To enhance the learning environment the use of posters, models and videos may be added. At the conclusion of this workshop, patients will be able to demonstrate knowledge of the importance of sleep to overall health, well-being, and quality of life. They will understand the symptoms of, and treatments for, common sleep disorders. Patients will also be able to identify daytime and nighttime behaviors which impact sleep, and they will be able to apply these tools to help manage sleep-related challenges. The purpose of this lesson is to  provide patients with a general overview of sleep and outline the importance of quality sleep. Patients will learn about a few of the most common sleep disorders. Patients will also be introduced to the concept of "sleep hygiene," and discover ways to self-manage certain sleeping problems through simple daily behavior changes. Finally, the workshop will motivate patients by clarifying the links between quality sleep and their goals of heart-healthy living.   Recognizing and Reducing Stress Clinical staff led group instruction and group discussion with PowerPoint presentation and patient guidebook. To enhance the learning environment the use of posters, models and videos may be added. At the conclusion of this workshop, patients will be able to understand the types of stress reactions, differentiate between acute and chronic stress, and recognize the impact that chronic stress has on their health. They will also be able to apply different coping mechanisms, such as reframing negative self-talk. Patients will have the opportunity to practice a variety of stress management techniques, such as deep abdominal breathing, progressive muscle relaxation, and/or guided imagery.  The purpose of this lesson is to educate patients on the role of stress in their lives and to provide healthy techniques for coping with it.  Learning Barriers/Preferences:  Learning Barriers/Preferences - 08/30/23 1158       Learning Barriers/Preferences   Learning Barriers None    Learning Preferences Audio;Computer/Internet;Group Instruction;Individual Instruction;Skilled Demonstration;Verbal Instruction;Video;Written Material;Pictoral             Education Topics:  Knowledge Questionnaire Score:  Knowledge Questionnaire Score - 08/30/23 1158       Knowledge Questionnaire Score   Pre Score 23/24             Core Components/Risk Factors/Patient Goals at Admission:  Personal Goals and Risk Factors at Admission -  08/30/23 1201       Core Components/Risk Factors/Patient Goals on Admission    Weight Management Yes;Obesity;Weight Loss    Intervention Weight Management: Develop a combined nutrition and exercise program designed to reach desired caloric intake, while maintaining appropriate intake of nutrient and fiber, sodium and fats, and appropriate energy expenditure required for the weight goal.;Weight Management: Provide education and appropriate resources to help participant work on and attain dietary goals.;Weight Management/Obesity: Establish reasonable short term and long term weight goals.;Obesity: Provide education and appropriate resources to help participant work on and attain dietary goals.    Expected Outcomes Short Term: Continue to assess and modify interventions until short term weight is achieved;Long Term: Adherence to nutrition and physical activity/exercise program aimed toward attainment  of established weight goal;Weight Loss: Understanding of general recommendations for a balanced deficit meal plan, which promotes 1-2 lb weight loss per week and includes a negative energy balance of [phone] kcal/d;Understanding recommendations for meals to include 15-35% energy as protein, 25-35% energy from fat, 35-60% energy from carbohydrates, less than 200mg  of dietary cholesterol, 20-35 gm of total fiber daily;Understanding of distribution of calorie intake throughout the day with the consumption of 4-5 meals/snacks    Diabetes Yes    Intervention Provide education about signs/symptoms and action to take for hypo/hyperglycemia.;Provide education about proper nutrition, including hydration, and aerobic/resistive exercise prescription along with prescribed medications to achieve blood glucose in normal ranges: Fasting glucose 65-99 mg/dL    Expected Outcomes Short Term: Participant verbalizes understanding of the signs/symptoms and immediate care of hyper/hypoglycemia, proper foot care and importance of  medication, aerobic/resistive exercise and nutrition plan for blood glucose control.;Long Term: Attainment of HbA1C < 7%.    Heart Failure Yes    Intervention Provide a combined exercise and nutrition program that is supplemented with education, support and counseling about heart failure. Directed toward relieving symptoms such as shortness of breath, decreased exercise tolerance, and extremity edema.    Expected Outcomes Long term: Adoption of self-care skills and reduction of barriers for early signs and symptoms recognition and intervention leading to self-care maintenance.;Short term: Daily weights obtained and reported for increase. Utilizing diuretic protocols set by physician.;Short term: Attendance in program 2-3 days a week with increased exercise capacity. Reported lower sodium intake. Reported increased fruit and vegetable intake. Reports medication compliance.;Improve functional capacity of life    Hypertension Yes    Intervention Provide education on lifestyle modifcations including regular physical activity/exercise, weight management, moderate sodium restriction and increased consumption of fresh fruit, vegetables, and low fat dairy, alcohol moderation, and smoking cessation.;Monitor prescription use compliance.    Expected Outcomes Short Term: Continued assessment and intervention until BP is < 140/40mm HG in hypertensive participants. < 130/54mm HG in hypertensive participants with diabetes, heart failure or chronic kidney disease.;Long Term: Maintenance of blood pressure at goal levels.    Lipids Yes    Intervention Provide education and support for participant on nutrition & aerobic/resistive exercise along with prescribed medications to achieve LDL 70mg , HDL >40mg .    Expected Outcomes Long Term: Cholesterol controlled with medications as prescribed, with individualized exercise RX and with personalized nutrition plan. Value goals: LDL < 70mg , HDL > 40 mg.;Short Term: Participant states  understanding of desired cholesterol values and is compliant with medications prescribed. Participant is following exercise prescription and nutrition guidelines.    Stress Yes    Intervention Offer individual and/or small group education and counseling on adjustment to heart disease, stress management and health-related lifestyle change. Teach and support self-help strategies.;Refer participants experiencing significant psychosocial distress to appropriate mental health specialists for further evaluation and treatment. When possible, include family members and significant others in education/counseling sessions.    Expected Outcomes Short Term: Participant demonstrates changes in health-related behavior, relaxation and other stress management skills, ability to obtain effective social support, and compliance with psychotropic medications if prescribed.;Long Term: Emotional wellbeing is indicated by absence of clinically significant psychosocial distress or social isolation.             Core Components/Risk Factors/Patient Goals Review:   Goals and Risk Factor Review     Row Name 09/17/23 1724             Core Components/Risk Factors/Patient Goals Review   Personal Goals  Review Weight Management/Obesity;Heart Failure;Stress;Hypertension;Lipids;Diabetes       Review Glover has been doing well with exercise at cardiac rehab. Weight unchanged. Map 90 to low 100's. Will forward vital signs to Dr Bishop Dublin for review as Jimel is going on a crusie with his wife at the end of the week.       Expected Outcomes Asif will continue to participate in cardiac rehab for exercise, nutrition and lifestyle modifications                Core Components/Risk Factors/Patient Goals at Discharge (Final Review):   Goals and Risk Factor Review - 09/17/23 1724       Core Components/Risk Factors/Patient Goals Review   Personal Goals Review Weight Management/Obesity;Heart  Failure;Stress;Hypertension;Lipids;Diabetes    Review Keondre has been doing well with exercise at cardiac rehab. Weight unchanged. Map 90 to low 100's. Will forward vital signs to Dr Bishop Dublin for review as Sol is going on a crusie with his wife at the end of the week.    Expected Outcomes Calem will continue to participate in cardiac rehab for exercise, nutrition and lifestyle modifications             ITP Comments:  ITP Comments     Row Name 08/30/23 0928 09/17/23 1716         ITP Comments Dr. Armanda Magic medical director. Introduction to pritikin education/intensive cardiac rehab. Initial orientation packet reviewed with patient. 30 Day ITP comments.  Erron has good participation when in attendance at  cardiac rehab               Comments: See ITP Comments

## 2023-09-18 ENCOUNTER — Ambulatory Visit (HOSPITAL_COMMUNITY): Payer: Self-pay | Admitting: Pharmacist

## 2023-09-18 LAB — POCT INR: INR: 1.8 — AB (ref 2.0–3.0)

## 2023-09-19 ENCOUNTER — Telehealth: Payer: Self-pay | Admitting: *Deleted

## 2023-09-19 ENCOUNTER — Encounter (HOSPITAL_COMMUNITY)
Admission: RE | Admit: 2023-09-19 | Discharge: 2023-09-19 | Disposition: A | Discharge status: Home / Self Care | Source: Ambulatory Visit | Attending: Internal Medicine | Admitting: Internal Medicine

## 2023-09-19 DIAGNOSIS — Z95811 Presence of heart assist device: Secondary | ICD-10-CM | POA: Diagnosis present

## 2023-09-19 MED ORDER — ROSUVASTATIN CALCIUM 40 MG PO TABS: 40.0000 mg | ORAL_TABLET | Freq: Every day | ORAL | 2 refills | Status: DC

## 2023-09-19 NOTE — Telephone Encounter (Signed)
 Refill of Rosuvastatin sent in Request for Ranexa also sent from pharmacy but this medication was d/c when pt got his LVAD put in.

## 2023-09-21 ENCOUNTER — Encounter (HOSPITAL_COMMUNITY)

## 2023-09-24 ENCOUNTER — Encounter (HOSPITAL_COMMUNITY)

## 2023-09-25 ENCOUNTER — Ambulatory Visit (HOSPITAL_COMMUNITY): Payer: Self-pay | Admitting: Pharmacist

## 2023-09-25 LAB — POCT INR: INR: 1.2 — AB (ref 2.0–3.0)

## 2023-09-25 NOTE — Progress Notes (Signed)
 LVAD INR

## 2023-09-26 ENCOUNTER — Encounter (HOSPITAL_COMMUNITY)

## 2023-09-28 ENCOUNTER — Encounter (HOSPITAL_COMMUNITY)

## 2023-10-01 ENCOUNTER — Encounter (HOSPITAL_COMMUNITY)

## 2023-10-02 ENCOUNTER — Ambulatory Visit (HOSPITAL_COMMUNITY): Payer: Self-pay | Admitting: Pharmacist

## 2023-10-02 LAB — POCT INR: INR: 4.7 — AB (ref 2.0–3.0)

## 2023-10-03 ENCOUNTER — Encounter (HOSPITAL_COMMUNITY)
Admission: RE | Admit: 2023-10-03 | Discharge: 2023-10-03 | Disposition: A | Discharge status: Home / Self Care | Source: Ambulatory Visit | Attending: Internal Medicine | Admitting: Internal Medicine

## 2023-10-03 DIAGNOSIS — Z95811 Presence of heart assist device: Secondary | ICD-10-CM | POA: Diagnosis not present

## 2023-10-03 LAB — GLUCOSE, CAPILLARY: Glucose-Capillary: 242 mg/dL — ABNORMAL HIGH (ref 70–99)

## 2023-10-03 NOTE — Progress Notes (Signed)
 Shandy returned to cardiac rehab after his vacation and exercised without difficulty.Monte Antonio RN BSN

## 2023-10-04 ENCOUNTER — Other Ambulatory Visit (HOSPITAL_COMMUNITY): Payer: Self-pay | Admitting: *Deleted

## 2023-10-04 DIAGNOSIS — I5022 Chronic systolic (congestive) heart failure: Secondary | ICD-10-CM

## 2023-10-04 DIAGNOSIS — Z95811 Presence of heart assist device: Secondary | ICD-10-CM

## 2023-10-04 DIAGNOSIS — E7800 Pure hypercholesterolemia, unspecified: Secondary | ICD-10-CM

## 2023-10-04 MED ORDER — SILDENAFIL CITRATE 20 MG PO TABS: 20.0000 mg | ORAL_TABLET | Freq: Three times a day (TID) | ORAL | 5 refills | Status: AC

## 2023-10-04 MED ORDER — MEXILETINE HCL 250 MG PO CAPS: 250.0000 mg | ORAL_CAPSULE | Freq: Two times a day (BID) | ORAL | 3 refills | Status: AC

## 2023-10-04 MED ORDER — DIGOXIN 125 MCG PO TABS: 0.1250 mg | ORAL_TABLET | Freq: Every day | ORAL | 3 refills | Status: AC

## 2023-10-04 MED ORDER — AMIODARONE HCL 200 MG PO TABS: 200.0000 mg | ORAL_TABLET | Freq: Every day | ORAL | 3 refills | Status: DC

## 2023-10-04 MED ORDER — SPIRONOLACTONE 25 MG PO TABS: 25.0000 mg | ORAL_TABLET | Freq: Every day | ORAL | 3 refills | Status: AC

## 2023-10-04 MED ORDER — EZETIMIBE 10 MG PO TABS: 10.0000 mg | ORAL_TABLET | Freq: Every day | ORAL | 3 refills | Status: AC

## 2023-10-05 ENCOUNTER — Encounter (HOSPITAL_COMMUNITY)
Admission: RE | Admit: 2023-10-05 | Discharge: 2023-10-05 | Disposition: A | Discharge status: Home / Self Care | Source: Ambulatory Visit | Attending: Internal Medicine | Admitting: Internal Medicine

## 2023-10-05 ENCOUNTER — Telehealth (HOSPITAL_COMMUNITY): Payer: Self-pay | Admitting: Unknown Physician Specialty

## 2023-10-05 DIAGNOSIS — Z95811 Presence of heart assist device: Secondary | ICD-10-CM

## 2023-10-05 NOTE — Telephone Encounter (Signed)
 Received page from [facility] [place] in [place] stating pt is up a couple lbs since returning from cruise. VAD coordinator reached out to pt.  Pt tells me that his weight is up 13 lbs in 2 days. Pt states that he is more SOB, has lower extremity edema, and abdominal distention. Pt states that he ate more salt on the cruise than normal.  D/w Dr Julane Ny, pt instructed to take Torsemide  40 mg bid for 3 days and 80 mEq of potassium bid for 3 days. Then resume a dose of 40 mg daily Torsemide  and Potassium 60 bid. Pt informed that if by day 2 of diuretics his weight is back to baseline he can resume his normal dosing of Torsemide  and Potassium. Pt verbalized understanding.  Adams Adams RN, BSN VAD Coordinator 24/7 Pager [phone]

## 2023-10-05 NOTE — Progress Notes (Signed)
 Joe [name]'s weight is 152.6 kg today. Patient reports feeling a little more short of breath than usual. Trace lower extremity edema noted. Greater on the right. Oxygen saturation 96% on room air. Will let the LVAD coordinator know about weight gain. Spoke with Abraham Hoffmann LVAD coordinator. Isa Manuel said she will call Abundio later on today to discuss. Patient aware and is exercising without complaints.Will continue to monitor the patient throughout  the program. Map 100. Pre exercise.Monte Antonio RN BSN

## 2023-10-08 ENCOUNTER — Encounter (HOSPITAL_COMMUNITY)
Admission: RE | Admit: 2023-10-08 | Discharge: 2023-10-08 | Disposition: A | Discharge status: Home / Self Care | Source: Ambulatory Visit | Attending: Internal Medicine | Admitting: Internal Medicine

## 2023-10-08 DIAGNOSIS — Z95811 Presence of heart assist device: Secondary | ICD-10-CM

## 2023-10-08 LAB — GLUCOSE, CAPILLARY: Glucose-Capillary: 272 mg/dL — ABNORMAL HIGH (ref 70–99)

## 2023-10-09 ENCOUNTER — Ambulatory Visit (HOSPITAL_COMMUNITY): Payer: Self-pay | Admitting: Pharmacist

## 2023-10-09 LAB — POCT INR: INR: 1.5 — AB (ref 2.0–3.0)

## 2023-10-09 NOTE — Progress Notes (Signed)
 Cardiac Individual Treatment Plan  Patient Details  Name: Joe [name] MRN: [id number] Date of Birth: [birth year] Referring Provider:   Flowsheet Row INTENSIVE CARDIAC REHAB ORIENT from 08/30/2023 in [facility]  Referring Provider Jules Oar, MD       Initial Encounter Date:  Flowsheet Row INTENSIVE CARDIAC REHAB ORIENT from 08/30/2023 in [facility]  Date 08/30/23       Visit Diagnosis: 05/02/24 LVAD, HM3  Patient's Home Medications on Admission:  Current Outpatient Medications:    acetaminophen  (TYLENOL ) 500 MG tablet, Take 1,000 mg by mouth every 6 (six) hours as needed for moderate pain or headache., Disp: , Rfl:    allopurinol  (ZYLOPRIM ) 100 MG tablet, Take 100 mg by mouth in the morning., Disp: , Rfl:    amiodarone  (PACERONE ) 200 MG tablet, Take 1 tablet (200 mg total) by mouth daily., Disp: 90 tablet, Rfl: 3   colchicine 0.6 MG tablet, Take 0.6 mg by mouth daily as needed (Gout)., Disp: , Rfl:    diclofenac  Sodium (VOLTAREN ) 1 % GEL, APPLY 1 G TOPICALLY DAILY. (Patient taking differently: Apply 1 g topically 4 (four) times daily as needed (pain.).), Disp: 100 g, Rfl: 0   digoxin  (LANOXIN ) 0.125 MG tablet, Take 1 tablet (0.125 mg total) by mouth daily., Disp: 90 tablet, Rfl: 3   escitalopram  (LEXAPRO ) 10 MG tablet, Take 10 mg by mouth at bedtime., Disp: , Rfl:    ezetimibe  (ZETIA ) 10 MG tablet, Take 1 tablet (10 mg total) by mouth daily., Disp: 90 tablet, Rfl: 3   gabapentin  (NEURONTIN ) 300 MG capsule, Take 300 mg by mouth 3 (three) times daily., Disp: , Rfl:    levothyroxine  (SYNTHROID ) 25 MCG tablet, Take 25 mcg by mouth daily before breakfast., Disp: , Rfl:    metFORMIN  (GLUCOPHAGE -XR) 500 MG 24 hr tablet, Take 1,000 mg by mouth in the morning and at bedtime., Disp: , Rfl:    mexiletine (MEXITIL ) 250 MG capsule, Take 1 capsule (250 mg total) by  mouth 2 (two) times daily., Disp: 180 capsule, Rfl: 3   multivitamin (ONE-A-DAY MEN'S) TABS tablet, Take 1 tablet by mouth in the morning., Disp: , Rfl:    omeprazole  (PRILOSEC) 40 MG capsule, Take 1 capsule (40 mg total) by mouth daily., Disp: 90 capsule, Rfl: 1   ondansetron  (ZOFRAN -ODT) 4 MG disintegrating tablet, Take 1 tablet (4 mg total) by mouth every 8 (eight) hours as needed for nausea or vomiting., Disp: 20 tablet, Rfl: 0   OZEMPIC, 2 MG/DOSE, 8 MG/3ML SOPN, Inject 2 mg into the skin every Monday., Disp: , Rfl:    Polyethylene Glycol 400 (BLINK TEARS OP), Place 2 drops into both eyes 4 (four) times daily as needed (for dry eyes)., Disp: , Rfl:    potassium chloride  SA (KLOR-CON  M) 20 MEQ tablet, Take 3 tablets (60 mEq total) by mouth 2 (two) times daily., Disp: 540 tablet, Rfl: 1   rosuvastatin  (CRESTOR ) 40 MG tablet, Take 1 tablet (40 mg total) by mouth daily., Disp: 90 tablet, Rfl: 2   sildenafil  (REVATIO ) 20 MG tablet, Take 1 tablet (20 mg total) by mouth 3 (three) times daily., Disp: 90 tablet, Rfl: 5   sildenafil  (VIAGRA ) 50 MG tablet, Take 1 tablet (50 mg total) by mouth daily as needed for erectile dysfunction., Disp: 10 tablet, Rfl: 6   spironolactone  (ALDACTONE ) 25 MG tablet, Take 1 tablet (25 mg total) by mouth  daily., Disp: 90 tablet, Rfl: 3   torsemide  (DEMADEX ) 20 MG tablet, TAKE 2 TABLETS (40 MG TOTAL) BY MOUTH DAILY., Disp: 180 tablet, Rfl: 2   warfarin (COUMADIN ) 5 MG tablet, Take 1.5 tablets (7.5 mg total) by mouth daily. (Patient taking differently: Take 7.5 mg by mouth every evening.), Disp: 60 tablet, Rfl: 5  Past Medical History: Past Medical History:  Diagnosis Date   Abnormal EKG    Acute systolic heart failure (HCC) 09/06/2017   Angina pectoris (HCC) 09/05/2017   Body mass index 45.0-49.9, adult (HCC)    CAD S/P percutaneous coronary angioplasty 09/07/2017   mLAD and dLAD PCI with DES 09/06/17   CHF (congestive heart failure) (HCC)    Chronic systolic  (congestive) heart failure (HCC) 03/19/2019   Complication of anesthesia 1995   "had hard time waking up; breathing w/vasectomy"   Coronary artery disease    Erectile dysfunction    Essential hypertension, benign    Fatigue    GERD (gastroesophageal reflux disease)    Gout    High cholesterol    "just started tx yesterday" (09/06/2017)   History of gout    "RX prn" (09/06/2017)   Ischemic cardiomyopathy 09/07/2017   EF 25-35%   Muscular chest pain    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   Below Left Nare (MOH's)   Obesity    Obstructive sleep apnea    OSA on CPAP    Plantar fasciitis    Pre-operative clearance 02/11/2018   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/02/2019   [employer]   Right bundle branch block    Shortness of breath 09/05/2017   Testosterone deficiency    Type 2 diabetes mellitus with complication, without long-term current use of insulin  (HCC) 09/05/2017   Type II diabetes mellitus (HCC)    "started tx 08/28/2017"    Tobacco Use: Social History   Tobacco Use  Smoking Status Never  Smokeless Tobacco Never    Labs: Review Flowsheet  More data exists      Latest Ref Rng & Units 05/19/2023 05/20/2023 05/21/2023 07/19/2023 08/06/2023  Labs for ITP Cardiac and Pulmonary Rehab  Hemoglobin A1c 4.8 - 5.6 % - - - 9.0  -  Bicarbonate 20.0 - 28.0 mmol/L - - - - 29.7  30.8   TCO2 22 - 32 mmol/L - - - - 31  32   O2 Saturation % 65.8  56.7  60.2  - 61  64     Details       Multiple values from one day are sorted in reverse-chronological order         Capillary Blood Glucose: Lab Results  Component Value Date   GLUCAP 272 (H) 10/08/2023   GLUCAP 242 (H) 10/03/2023   GLUCAP 247 (H) 09/03/2023   GLUCAP 260 (H) 09/03/2023   GLUCAP 173 (H) 08/06/2023     Exercise Target Goals: Exercise Program Goal: Individual exercise prescription set using results from initial 6 min walk test and THRR while considering  patient's  activity barriers and safety.   Exercise Prescription Goal: Initial exercise prescription builds to 30-45 minutes a day of aerobic activity, 2-3 days per week.  Home exercise guidelines will be given to patient during program as part of exercise prescription that the participant will acknowledge.  Activity Barriers & Risk Stratification:   6 Minute Walk:  6 Minute Walk     Row Name 08/30/23 1144         6 Minute  Walk   Phase Initial     Distance 1440 feet     Walk Time 6 minutes     # of Rest Breaks 0     MPH 2.73     METS 2.89     RPE 13     Perceived Dyspnea  2     VO2 Peak 10.12     Symptoms Yes (comment)     Comments SOB, resolved with rest     Resting HR 77 bpm     Resting BP 84/0  MAP     Resting Oxygen Saturation  97 %     Exercise Oxygen Saturation  during 6 min walk 97 %     Max Ex. HR 100 bpm     Max Ex. BP 86/0  MAP     2 Minute Post BP 92/0  MAP              Oxygen Initial Assessment:   Oxygen Re-Evaluation:   Oxygen Discharge (Final Oxygen Re-Evaluation):   Initial Exercise Prescription:  Initial Exercise Prescription - 08/30/23 1100       Date of Initial Exercise RX and Referring Provider   Date 08/30/23    Referring Provider Jules Oar, MD    Expected Discharge Date 11/21/23      NuStep   Level 1    SPM 65    Minutes 15    METs 2.5      Track   Laps 10    Minutes 15    METs 2.5      Prescription Details   Frequency (times per week) 3    Duration Progress to 30 minutes of continuous aerobic without signs/symptoms of physical distress      Intensity   THRR 40-80% of Max Heartrate 67-134    Ratings of Perceived Exertion 11-13    Perceived Dyspnea 0-4      Progression   Progression Continue progressive overload as per policy without signs/symptoms or physical distress.      Resistance Training   Training Prescription Yes    Weight 3    Reps 10-15             Perform Capillary Blood Glucose checks as  needed.  Exercise Prescription Changes:   Exercise Prescription Changes     Row Name 09/03/23 1200 09/17/23 1021 10/08/23 1032         Response to Exercise   Blood Pressure (Admit) --  86 MAP 102/0  MAP 102/0  MAP     Blood Pressure (Exercise) --  104 MAP 106/0  MAP 106/0  MAP     Blood Pressure (Exit) --  96 MAP 90/0  MAP 100/0  MAP     Heart Rate (Admit) 73 bpm 66 bpm 67 bpm     Heart Rate (Exercise) 109 bpm 104 bpm 95 bpm     Heart Rate (Exit) 81 bpm 75 bpm 76 bpm     Rating of Perceived Exertion (Exercise) 15 12 11      Symptoms None None None     Comments Pt first day -- --     Duration Continue with 30 min of aerobic exercise without signs/symptoms of physical distress. Continue with 30 min of aerobic exercise without signs/symptoms of physical distress. Continue with 30 min of aerobic exercise without signs/symptoms of physical distress.     Intensity THRR unchanged THRR unchanged THRR unchanged       Progression   Progression Continue to  progress workloads to maintain intensity without signs/symptoms of physical distress. Continue to progress workloads to maintain intensity without signs/symptoms of physical distress. Continue to progress workloads to maintain intensity without signs/symptoms of physical distress.     Average METs 2.2 2.6 2.8       Resistance Training   Training Prescription Yes Yes Yes     Weight 3 3 lbs 3 lbs     Reps 10-15 10-15 10-15     Time -- 10 Minutes 10 Minutes       Interval Training   Interval Training -- No No       NuStep   Level 1 1 2      SPM 67 91 105     Minutes 15 15 15      METs 1.6 2.4 2.8       Track   Laps 14 15 14      Minutes 15 15 15      METs 2.79 2.91 2.79              Exercise Comments:   Exercise Comments     Row Name 09/03/23 1156 09/19/23 1130         Exercise Comments Pt first full day in program. Pt completed 15 minutes of track walking and 15 minutes of the NuStep averaging 2.2 METs for his aerobic  exercise. Pt reported an RPE of 15 on the NuStep at level 1 and was asked to decrease his pace. Pt completed resistance exercise with 3lb weights and cool-down without unusual signs or symptoms. Telemetry rhythm unchanged from orientation. Reviewed METs with Zygmunt. He will be absent the next 2 weeks on a cruise.               Exercise Goals and Review:   Exercise Goals     Row Name 08/30/23 0929             Exercise Goals   Increase Physical Activity Yes       Intervention Provide advice, education, support and counseling about physical activity/exercise needs.;Develop an individualized exercise prescription for aerobic and resistive training based on initial evaluation findings, risk stratification, comorbidities and participant's personal goals.       Expected Outcomes Long Term: Exercising regularly at least 3-5 days a week.;Short Term: Attend rehab on a regular basis to increase amount of physical activity.;Long Term: Add in home exercise to make exercise part of routine and to increase amount of physical activity.       Increase Strength and Stamina Yes       Intervention Develop an individualized exercise prescription for aerobic and resistive training based on initial evaluation findings, risk stratification, comorbidities and participant's personal goals.;Provide advice, education, support and counseling about physical activity/exercise needs.       Expected Outcomes Short Term: Increase workloads from initial exercise prescription for resistance, speed, and METs.;Short Term: Perform resistance training exercises routinely during rehab and add in resistance training at home;Long Term: Improve cardiorespiratory fitness, muscular endurance and strength as measured by increased METs and functional capacity ( )       Able to understand and use rate of perceived exertion (RPE) scale Yes       Intervention Provide education and explanation on how to use RPE scale       Expected  Outcomes Short Term: Able to use RPE daily in rehab to express subjective intensity level;Long Term:  Able to use RPE to guide intensity level when exercising independently  Knowledge and understanding of Target Heart Rate Range (THRR) Yes       Intervention Provide education and explanation of THRR including how the numbers were predicted and where they are located for reference       Expected Outcomes Short Term: Able to state/look up THRR;Short Term: Able to use daily as guideline for intensity in rehab;Long Term: Able to use THRR to govern intensity when exercising independently       Understanding of Exercise Prescription Yes       Intervention Provide education, explanation, and written materials on patient's individual exercise prescription       Expected Outcomes Short Term: Able to explain program exercise prescription;Long Term: Able to explain home exercise prescription to exercise independently                Exercise Goals Re-Evaluation :  Exercise Goals Re-Evaluation     Row Name 09/07/23 1450 10/08/23 1130           Exercise Goal Re-Evaluation   Exercise Goals Review Increase Physical Activity;Increase Strength and Stamina;Able to understand and use rate of perceived exertion (RPE) scale Increase Physical Activity;Increase Strength and Stamina;Able to understand and use rate of perceived exertion (RPE) scale      Comments Roey is able to understand and use RPE scale appropriately. Lenton returned to exercise last week after being on vacation. Tolerating exercise well without symptoms. Will work on [name].      Expected Outcomes Progress workloads as tolerated to help increase strength and stamina. Continue to increase workloads as tolerated.               Discharge Exercise Prescription (Final Exercise Prescription Changes):  Exercise Prescription Changes - 10/08/23 1032       Response to Exercise   Blood Pressure (Admit) 102/0   MAP    Blood Pressure (Exercise) 106/0   MAP   Blood Pressure (Exit) 100/0   MAP   Heart Rate (Admit) 67 bpm    Heart Rate (Exercise) 95 bpm    Heart Rate (Exit) 76 bpm    Rating of Perceived Exertion (Exercise) 11    Symptoms None    Duration Continue with 30 min of aerobic exercise without signs/symptoms of physical distress.    Intensity THRR unchanged      Progression   Progression Continue to progress workloads to maintain intensity without signs/symptoms of physical distress.    Average METs 2.8      Resistance Training   Training Prescription Yes    Weight 3 lbs    Reps 10-15    Time 10 Minutes      Interval Training   Interval Training No      NuStep   Level 2    SPM 105    Minutes 15    METs 2.8      Track   Laps 14    Minutes 15    METs 2.79             Nutrition:  Target Goals: Understanding of nutrition guidelines, daily intake of sodium 1500mg , cholesterol 200mg , calories 30% from fat and 7% or less from saturated fats, daily to have 5 or more servings of fruits and vegetables.  Biometrics:  Pre Biometrics - 08/30/23 1143       Pre Biometrics   Waist Circumference 53.5 inches    Hip Circumference 47 inches    Waist to Hip Ratio 1.14 %    Triceps Skinfold 42  mm    % Body Fat 42 %    Grip Strength 40 kg    Flexibility 0 in   not done, low back issues   Single Leg Stand 7.12 seconds              Nutrition Therapy Plan and Nutrition Goals:  Nutrition Therapy & Goals - 10/05/23 1447       Nutrition Therapy   Diet Heart Healthy/carbohydrate consistent diet    Drug/Food Interactions Statins/Certain Fruits;Coumadin /Vit K      Personal Nutrition Goals   Nutrition Goal Patient to identify strategies for reducing cardiovascular risk by attending the [facility] education and nutrition series weekly.   goal not met.   Personal Goal #2 Patient to improve diet quality by using the plate method as a guide for meal planning to include lean protein/plant  protein, fruits, vegetables, whole grains, nonfat dairy as part of a well-balanced diet.   goal in progress.   Personal Goal #3 Patient to identify strategies for weight loss of 0.5-2.0# per week   goal not met.   Personal Goal #4 Patient to identify strategies for improved blood sugar control with goal A1c <7%   goal in progress.   Comments Goals not met; patient remains precontemplative toward dietary changes. Slevin has medical history of CAD, LVAD, DM2, OSA, CHF, HTN, hyperlipidemia. He is unable to be listed for transplant through [facility] until he loses ~25# with goal being BMI <35. His A1c is not well controlled. He does not regularly attend the [facility] education/nutrition series. He is up 7.5# since starting with our program. Patient will continue to benefit from participation in intensive cardiac rehab for nutrition, exercise, and lifestyle modification.      Intervention Plan   Intervention Prescribe, educate and counsel regarding individualized specific dietary modifications aiming towards targeted core components such as weight, hypertension, lipid management, diabetes, heart failure and other comorbidities.;Nutrition handout(s) given to patient.    Expected Outcomes Short Term Goal: Understand basic principles of dietary content, such as calories, fat, sodium, cholesterol and nutrients.;Long Term Goal: Adherence to prescribed nutrition plan.             Nutrition Assessments:  MEDIFICTS Score Key: >=70 Need to make dietary changes  40-70 Heart Healthy Diet <= 40 Therapeutic Level Cholesterol Diet    Picture Your Plate Scores: <84 Unhealthy dietary pattern with much room for improvement. 41-50 Dietary pattern unlikely to meet recommendations for good health and room for improvement. 51-60 More healthful dietary pattern, with some room for improvement.  >60 Healthy dietary pattern, although there may be some specific behaviors that could be improved.    Nutrition Goals  Re-Evaluation:  Nutrition Goals Re-Evaluation     Row Name 09/04/23 1147 10/05/23 1447           Goals   Current Weight 327 lb 13.2 oz (148.7 kg) 336 lb 6.8 oz (152.6 kg)      Comment A1c 9.0, LDL 104 (crestor , zetia ).He continues regular follow-up with anti-coag clinic no new labs; most recent labs  A1c 9.0, LDL 104 (crestor , zetia ). He continues regular follow-up with anti-coag clinic      Expected Outcome Brondon has medical history of CAD, LVAD, DM2, OSA, CHF, HTN, hyperlipidemia. He is unable to be listed for transplant through [facility] until he loses ~25# with goal being BMI <35. His A1c is not well controlled. He is taking ozempic but reports that he is not feeling as much satiety as this point. He  has previously participated in cardiac rehab in 2019. Patient will benefit from participation in intensive cardiac rehab for nutrition, exercise, and lifestyle modification. Goals not met; patient remains precontemplative toward dietary changes. Bralen has medical history of CAD, LVAD, DM2, OSA, CHF, HTN, hyperlipidemia. He is unable to be listed for transplant through [facility] until he loses ~25# with goal being BMI <35. His A1c is not well controlled. He does not regularly attend the [facility] education/nutrition series. He is up 7.5# since starting with our program. Patient will continue to benefit from participation in intensive cardiac rehab for nutrition, exercise, and lifestyle modification.               Nutrition Goals Re-Evaluation:  Nutrition Goals Re-Evaluation     Row Name 09/04/23 1147 10/05/23 1447           Goals   Current Weight 327 lb 13.2 oz (148.7 kg) 336 lb 6.8 oz (152.6 kg)      Comment A1c 9.0, LDL 104 (crestor , zetia ).He continues regular follow-up with anti-coag clinic no new labs; most recent labs  A1c 9.0, LDL 104 (crestor , zetia ). He continues regular follow-up with anti-coag clinic      Expected Outcome Kalep has medical history of CAD, LVAD, DM2, OSA, CHF, HTN,  hyperlipidemia. He is unable to be listed for transplant through [facility] until he loses ~25# with goal being BMI <35. His A1c is not well controlled. He is taking ozempic but reports that he is not feeling as much satiety as this point. He has previously participated in cardiac rehab in 2019. Patient will benefit from participation in intensive cardiac rehab for nutrition, exercise, and lifestyle modification. Goals not met; patient remains precontemplative toward dietary changes. Edvardo has medical history of CAD, LVAD, DM2, OSA, CHF, HTN, hyperlipidemia. He is unable to be listed for transplant through [facility] until he loses ~25# with goal being BMI <35. His A1c is not well controlled. He does not regularly attend the [facility] education/nutrition series. He is up 7.5# since starting with our program. Patient will continue to benefit from participation in intensive cardiac rehab for nutrition, exercise, and lifestyle modification.               Nutrition Goals Discharge (Final Nutrition Goals Re-Evaluation):  Nutrition Goals Re-Evaluation - 10/05/23 1447       Goals   Current Weight 336 lb 6.8 oz (152.6 kg)    Comment no new labs; most recent labs  A1c 9.0, LDL 104 (crestor , zetia ). He continues regular follow-up with anti-coag clinic    Expected Outcome Goals not met; patient remains precontemplative toward dietary changes. Nalu has medical history of CAD, LVAD, DM2, OSA, CHF, HTN, hyperlipidemia. He is unable to be listed for transplant through [facility] until he loses ~25# with goal being BMI <35. His A1c is not well controlled. He does not regularly attend the [facility] education/nutrition series. He is up 7.5# since starting with our program. Patient will continue to benefit from participation in intensive cardiac rehab for nutrition, exercise, and lifestyle modification.             Psychosocial: Target Goals: Acknowledge presence or absence of significant depression and/or stress, maximize  coping skills, provide positive support system. Participant is able to verbalize types and ability to use techniques and skills needed for reducing stress and depression.  Initial Review & Psychosocial Screening:  Initial Psych Review & Screening - 08/30/23 4098       Initial Review   Current issues  with History of Depression;Current Stress Concerns;Current Depression    Source of Stress Concerns Chronic Illness;Financial;Unable to perform yard/household activities;Unable to participate in former interests or hobbies    Comments having heart failure and the LVAD has been a lifechanging event for Naheim. Terris cant go on the transplant list until he looses weight. Daisuke admits to [employer] low energy since hospitalization. Ural has not been able to work since November 2022 and has applied for disability/ social security. Garfield has no income in and has to rely on wife. Esther is currently depressed and is taking an antidepressant that is currently controlling his depression. Alyas is not interested in counseling at this time. Renard does meet with LVAD support group once a month.      Family Dynamics   Good Support System? Yes   Devaughn has his wife, his son and stepdaughter and neighbors for support.   Comments will continue to monitor and offer support as needed      Barriers   Psychosocial barriers to participate in program The patient should benefit from training in stress management and relaxation.      Screening Interventions   Interventions Encouraged to exercise;To provide support and resources with identified psychosocial needs;Provide feedback about the scores to participant    Expected Outcomes Long Term Goal: Stressors or current issues are controlled or eliminated.             Quality of Life Scores:  Quality of Life - 08/30/23 1158       Quality of Life   Select Quality of Life      Quality of Life Scores   Health/Function Pre 16.4 %    Socioeconomic Pre  23.79 %    Psych/Spiritual Pre 22.29 %    Family Pre 27.6 %    GLOBAL Pre 20.78 %            Scores of 19 and below usually indicate a poorer quality of life in these areas.  A difference of  2-3 points is a clinically meaningful difference.  A difference of 2-3 points in the total score of the Quality of Life Index has been associated with significant improvement in overall quality of life, self-image, physical symptoms, and general health in studies assessing change in quality of life.  PHQ-9: Review Flowsheet       08/30/2023  Depression screen PHQ 2/9  Decreased Interest 0  Down, Depressed, Hopeless 1  PHQ - 2 Score 1  Altered sleeping 0  Tired, decreased energy 3  Change in appetite 0  Feeling bad or failure about yourself  2  Trouble concentrating 1  Moving slowly or fidgety/restless 0  Suicidal thoughts 0  PHQ-9 Score 7  Difficult doing work/chores Very difficult   Interpretation of Total Score  Total Score Depression Severity:  1-4 = Minimal depression, 5-9 = Mild depression, 10-14 = Moderate depression, 15-19 = Moderately severe depression, 20-27 = Severe depression   Psychosocial Evaluation and Intervention:   Psychosocial Re-Evaluation:  Psychosocial Re-Evaluation     Row Name 09/17/23 1719 10/05/23 1129           Psychosocial Re-Evaluation   Current issues with Current Depression;Current Stress Concerns;History of Depression Current Depression;Current Stress Concerns;History of Depression      Comments Quality of life reviewed on 09/03/23.Kentavious admits to having some shortness of breath and a low energy level. James is in the process of applying for disability. Lacey says he worries about finances. Harmon hopes to be put  on the heart transplant list if he can loose the required weight. Sahid admits to being depressed at times due to heart failure diagnosis and recent LVAD placement. Miriam denies the need for counseling at this time. Furqan  returned to cardiac rehab after going on a cruise with his wife. Freddy says it was good to get way.      Expected Outcomes Juwuan will have controlled or decreased depression upon completion of cardiac rehab Michial will have controlled or decreased depression upon completion of cardiac rehab      Interventions Stress management education;Encouraged to attend Cardiac Rehabilitation for the exercise;Relaxation education Stress management education;Encouraged to attend Cardiac Rehabilitation for the exercise;Relaxation education      Continue Psychosocial Services  Follow up required by staff Follow up required by staff        Initial Review   Source of Stress Concerns Chronic Illness;Financial;Unable to participate in former interests or hobbies;Unable to perform yard/household activities Chronic Illness;Financial;Unable to participate in former interests or hobbies;Unable to perform yard/household activities      Comments Will continue to monitor and offer support as needed Will continue to monitor and offer support as needed               Psychosocial Discharge (Final Psychosocial Re-Evaluation):  Psychosocial Re-Evaluation - 10/05/23 1129       Psychosocial Re-Evaluation   Current issues with Current Depression;Current Stress Concerns;History of Depression    Comments Dirck returned to cardiac rehab after going on a cruise with his wife. Dru says it was good to get way.    Expected Outcomes Kamaury will have controlled or decreased depression upon completion of cardiac rehab    Interventions Stress management education;Encouraged to attend Cardiac Rehabilitation for the exercise;Relaxation education    Continue Psychosocial Services  Follow up required by staff      Initial Review   Source of Stress Concerns Chronic Illness;Financial;Unable to participate in former interests or hobbies;Unable to perform yard/household activities    Comments Will continue to monitor and offer  support as needed             Vocational Rehabilitation: Provide vocational rehab assistance to qualifying candidates.   Vocational Rehab Evaluation & Intervention:  Vocational Rehab - 08/30/23 1039       Initial Vocational Rehab Evaluation & Intervention   Assessment shows need for Vocational Rehabilitation No   Niguel is currently unemployed and is in the process of applying for social security disability. Adian is not intersted in vocational rehab at this time.            Education: Education Goals: Education classes will be provided on a weekly basis, covering required topics. Participant will state understanding/return demonstration of topics presented.    Education     Row Name 09/03/23 1500     Education   Cardiac Education Topics Pritikin   Glass blower/designer Nutrition   Nutrition Workshop Targeting Your Nutrition Priorities   Instruction Review Code 1- Verbalizes Understanding   Class Start Time 1150   Class Stop Time 1222   Class Time Calculation (min) 32 min    Row Name 09/05/23 1500     Education   Cardiac Education Topics Pritikin   Customer service manager   Weekly Topic One-Pot Wonders   Instruction Review Code 1- Verbalizes Understanding   Class Start Time 1145  Class Stop Time 1227   Class Time Calculation (min) 42 min    Row Name 09/07/23 1100     Education   Cardiac Education Topics Pritikin   Psychologist, forensic General Education   General Education Hypertension and Heart Disease   Instruction Review Code 1- Verbalizes Understanding   Class Start Time 1144   Class Stop Time 1217   Class Time Calculation (min) 33 min    Row Name 09/17/23 1100     Education   Cardiac Education Topics Pritikin   Select Core Videos     Core Videos   Educator Exercise Physiologist   Select Exercise  Education   Exercise Education Biomechanial Limitations   Instruction Review Code 1- Verbalizes Understanding   Class Start Time 1145   Class Stop Time 1218   Class Time Calculation (min) 33 min    Row Name 10/08/23 1300     Education   Cardiac Education Topics Pritikin   Select Workshops     Workshops   Educator Exercise Physiologist   Select Exercise   Exercise Workshop Managing Heart Disease: Your Path to a Healthier Heart   Instruction Review Code 1- Verbalizes Understanding   Class Start Time 1145   Class Stop Time 1237   Class Time Calculation (min) 52 min            Core Videos: Exercise    Move It!  Clinical staff conducted group or individual video education with verbal and written material and guidebook.  Patient learns the recommended Pritikin exercise program. Exercise with the goal of living a long, healthy life. Some of the health benefits of exercise include controlled diabetes, healthier blood pressure levels, improved cholesterol levels, improved heart and lung capacity, improved sleep, and better body composition. Everyone should speak with their doctor before starting or changing an exercise routine.  Biomechanical Limitations Clinical staff conducted group or individual video education with verbal and written material and guidebook.  Patient learns how biomechanical limitations can impact exercise and how we can mitigate and possibly overcome limitations to have an impactful and balanced exercise routine.  Body Composition Clinical staff conducted group or individual video education with verbal and written material and guidebook.  Patient learns that body composition (ratio of muscle mass to fat mass) is a key component to assessing overall fitness, rather than body weight alone. Increased fat mass, especially visceral belly fat, can put us  at increased risk for metabolic syndrome, type 2 diabetes, heart disease, and even death. It is recommended to combine  diet and exercise (cardiovascular and resistance training) to improve your body composition. Seek guidance from your physician and exercise physiologist before implementing an exercise routine.  Exercise Action Plan Clinical staff conducted group or individual video education with verbal and written material and guidebook.  Patient learns the recommended strategies to achieve and enjoy long-term exercise adherence, including variety, self-motivation, self-efficacy, and positive decision making. Benefits of exercise include fitness, good health, weight management, more energy, better sleep, less stress, and overall well-being.  Medical   Heart Disease Risk Reduction Clinical staff conducted group or individual video education with verbal and written material and guidebook.  Patient learns our heart is our most vital organ as it circulates oxygen, nutrients, white blood cells, and hormones throughout the entire body, and carries waste away. Data supports a plant-based eating plan like the Pritikin Program for its effectiveness in slowing progression  of and reversing heart disease. The video provides a number of recommendations to address heart disease.   Metabolic Syndrome and Belly Fat  Clinical staff conducted group or individual video education with verbal and written material and guidebook.  Patient learns what metabolic syndrome is, how it leads to heart disease, and how one can reverse it and keep it from coming back. You have metabolic syndrome if you have 3 of the following 5 criteria: abdominal obesity, high blood pressure, high triglycerides, low HDL cholesterol, and high blood sugar.  Hypertension and Heart Disease Clinical staff conducted group or individual video education with verbal and written material and guidebook.  Patient learns that high blood pressure, or hypertension, is very common in the [place] . Hypertension is largely due to excessive salt intake, but other important  risk factors include being overweight, physical inactivity, drinking too much alcohol , smoking, and not eating enough potassium from fruits and vegetables. High blood pressure is a leading risk factor for heart attack, stroke, congestive heart failure, dementia, kidney failure, and premature death. Long-term effects of excessive salt intake include stiffening of the arteries and thickening of heart muscle and organ damage. Recommendations include ways to reduce hypertension and the risk of heart disease.  Diseases of Our Time - Focusing on Diabetes Clinical staff conducted group or individual video education with verbal and written material and guidebook.  Patient learns why the best way to stop diseases of our time is prevention, through food and other lifestyle changes. Medicine (such as prescription pills and surgeries) is often only a Band-Aid on the problem, not a long-term solution. Most common diseases of our time include obesity, type 2 diabetes, hypertension, heart disease, and cancer. The Pritikin Program is recommended and has been proven to help reduce, reverse, and/or prevent the damaging effects of metabolic syndrome.  Nutrition   Overview of the Pritikin Eating Plan  Clinical staff conducted group or individual video education with verbal and written material and guidebook.  Patient learns about the Pritikin Eating Plan for disease risk reduction. The Pritikin Eating Plan emphasizes a wide variety of unrefined, minimally-processed carbohydrates, like fruits, vegetables, whole grains, and legumes. Go, Caution, and Stop food choices are explained. Plant-based and lean animal proteins are emphasized. Rationale provided for low sodium intake for blood pressure control, low added sugars for blood sugar stabilization, and low added fats and oils for coronary artery disease risk reduction and weight management.  Calorie Density  Clinical staff conducted group or individual video education with  verbal and written material and guidebook.  Patient learns about calorie density and how it impacts the Pritikin Eating Plan. Knowing the characteristics of the food you choose will help you decide whether those foods will lead to weight gain or weight loss, and whether you want to consume more or less of them. Weight loss is usually a side effect of the Pritikin Eating Plan because of its focus on low calorie-dense foods.  Label Reading  Clinical staff conducted group or individual video education with verbal and written material and guidebook.  Patient learns about the Pritikin recommended label reading guidelines and corresponding recommendations regarding calorie density, added sugars, sodium content, and whole grains.  Dining Out - Part 1  Clinical staff conducted group or individual video education with verbal and written material and guidebook.  Patient learns that restaurant meals can be sabotaging because they can be so high in calories, fat, sodium, and/or sugar. Patient learns recommended strategies on how to positively address this  and avoid unhealthy pitfalls.  Facts on Fats  Clinical staff conducted group or individual video education with verbal and written material and guidebook.  Patient learns that lifestyle modifications can be just as effective, if not more so, as many medications for lowering your risk of heart disease. A Pritikin lifestyle can help to reduce your risk of inflammation and atherosclerosis (cholesterol build-up, or plaque, in the artery walls). Lifestyle interventions such as dietary choices and physical activity address the cause of atherosclerosis. A review of the types of fats and their impact on blood cholesterol levels, along with dietary recommendations to reduce fat intake is also included.  Nutrition Action Plan  Clinical staff conducted group or individual video education with verbal and written material and guidebook.  Patient learns how to incorporate  Pritikin recommendations into their lifestyle. Recommendations include planning and keeping personal health goals in mind as an important part of their success.  Healthy Mind-Set    Healthy Minds, Bodies, Hearts  Clinical staff conducted group or individual video education with verbal and written material and guidebook.  Patient learns how to identify when they are stressed. Video will discuss the impact of that stress, as well as the many benefits of stress management. Patient will also be introduced to stress management techniques. The way we think, act, and feel has an impact on our hearts.  How Our Thoughts Can Heal Our Hearts  Clinical staff conducted group or individual video education with verbal and written material and guidebook.  Patient learns that negative thoughts can cause depression and anxiety. This can result in negative lifestyle behavior and serious health problems. Cognitive behavioral therapy is an effective method to help control our thoughts in order to change and improve our emotional outlook.  Additional Videos:  Exercise    Improving Performance  Clinical staff conducted group or individual video education with verbal and written material and guidebook.  Patient learns to use a non-linear approach by alternating intensity levels and lengths of time spent exercising to help burn more calories and lose more body fat. Cardiovascular exercise helps improve heart health, metabolism, hormonal balance, blood sugar control, and recovery from fatigue. Resistance training improves strength, endurance, balance, coordination, reaction time, metabolism, and muscle mass. Flexibility exercise improves circulation, posture, and balance. Seek guidance from your physician and exercise physiologist before implementing an exercise routine and learn your capabilities and proper form for all exercise.  Introduction to Yoga  Clinical staff conducted group or individual video education with  verbal and written material and guidebook.  Patient learns about yoga, a discipline of the coming together of mind, breath, and body. The benefits of yoga include improved flexibility, improved range of motion, better posture and core strength, increased lung function, weight loss, and positive self-image. Yoga's heart health benefits include lowered blood pressure, healthier heart rate, decreased cholesterol and triglyceride levels, improved immune function, and reduced stress. Seek guidance from your physician and exercise physiologist before implementing an exercise routine and learn your capabilities and proper form for all exercise.  Medical   Aging: Enhancing Your Quality of Life  Clinical staff conducted group or individual video education with verbal and written material and guidebook.  Patient learns key strategies and recommendations to stay in good physical health and enhance quality of life, such as prevention strategies, having an advocate, securing a Health Care Proxy and Power of Attorney, and keeping a list of medications and system for tracking them. It also discusses how to avoid risk for bone loss.  Biology of Weight Control  Clinical staff conducted group or individual video education with verbal and written material and guidebook.  Patient learns that weight gain occurs because we consume more calories than we burn (eating more, moving less). Even if your body weight is normal, you may have higher ratios of fat compared to muscle mass. Too much body fat puts you at increased risk for cardiovascular disease, heart attack, stroke, type 2 diabetes, and obesity-related cancers. In addition to exercise, following the Pritikin Eating Plan can help reduce your risk.  Decoding Lab Results  Clinical staff conducted group or individual video education with verbal and written material and guidebook.  Patient learns that lab test reflects one measurement whose values change over time and are  influenced by many factors, including medication, stress, sleep, exercise, food, hydration, pre-existing medical conditions, and more. It is recommended to use the knowledge from this video to become more involved with your lab results and evaluate your numbers to speak with your doctor.   Diseases of Our Time - Overview  Clinical staff conducted group or individual video education with verbal and written material and guidebook.  Patient learns that according to the CDC, 50% to 70% of chronic diseases (such as obesity, type 2 diabetes, elevated lipids, hypertension, and heart disease) are avoidable through lifestyle improvements including healthier food choices, listening to satiety cues, and increased physical activity.  Sleep Disorders Clinical staff conducted group or individual video education with verbal and written material and guidebook.  Patient learns how good quality and duration of sleep are important to overall health and well-being. Patient also learns about sleep disorders and how they impact health along with recommendations to address them, including discussing with a physician.  Nutrition  Dining Out - Part 2 Clinical staff conducted group or individual video education with verbal and written material and guidebook.  Patient learns how to plan ahead and communicate in order to maximize their dining experience in a healthy and nutritious manner. Included are recommended food choices based on the type of restaurant the patient is visiting.   Fueling a Banker conducted group or individual video education with verbal and written material and guidebook.  There is a strong connection between our food choices and our health. Diseases like obesity and type 2 diabetes are very prevalent and are in large-part due to lifestyle choices. The Pritikin Eating Plan provides plenty of food and hunger-curbing satisfaction. It is easy to follow, affordable, and helps reduce  health risks.  Menu Workshop  Clinical staff conducted group or individual video education with verbal and written material and guidebook.  Patient learns that restaurant meals can sabotage health goals because they are often packed with calories, fat, sodium, and sugar. Recommendations include strategies to plan ahead and to communicate with the manager, chef, or server to help order a healthier meal.  Planning Your Eating Strategy  Clinical staff conducted group or individual video education with verbal and written material and guidebook.  Patient learns about the Pritikin Eating Plan and its benefit of reducing the risk of disease. The Pritikin Eating Plan does not focus on calories. Instead, it emphasizes high-quality, nutrient-rich foods. By knowing the characteristics of the foods, we choose, we can determine their calorie density and make informed decisions.  Targeting Your Nutrition Priorities  Clinical staff conducted group or individual video education with verbal and written material and guidebook.  Patient learns that lifestyle habits have a tremendous impact on disease risk and  progression. This video provides eating and physical activity recommendations based on your personal health goals, such as reducing LDL cholesterol, losing weight, preventing or controlling type 2 diabetes, and reducing high blood pressure.  Vitamins and Minerals  Clinical staff conducted group or individual video education with verbal and written material and guidebook.  Patient learns different ways to obtain key vitamins and minerals, including through a recommended healthy diet. It is important to discuss all supplements you take with your doctor.   Healthy Mind-Set    Smoking Cessation  Clinical staff conducted group or individual video education with verbal and written material and guidebook.  Patient learns that cigarette smoking and tobacco addiction pose a serious health risk which affects millions  of people. Stopping smoking will significantly reduce the risk of heart disease, lung disease, and many forms of cancer. Recommended strategies for quitting are covered, including working with your doctor to develop a successful plan.  Culinary   Becoming a Set designer conducted group or individual video education with verbal and written material and guidebook.  Patient learns that cooking at home can be healthy, cost-effective, quick, and puts them in control. Keys to cooking healthy recipes will include looking at your recipe, assessing your equipment needs, planning ahead, making it simple, choosing cost-effective seasonal ingredients, and limiting the use of added fats, salts, and sugars.  Cooking - Breakfast and Snacks  Clinical staff conducted group or individual video education with verbal and written material and guidebook.  Patient learns how important breakfast is to satiety and nutrition through the entire day. Recommendations include key foods to eat during breakfast to help stabilize blood sugar levels and to prevent overeating at meals later in the day. Planning ahead is also a key component.  Cooking - Educational psychologist conducted group or individual video education with verbal and written material and guidebook.  Patient learns eating strategies to improve overall health, including an approach to cook more at home. Recommendations include thinking of animal protein as a side on your plate rather than center stage and focusing instead on lower calorie dense options like vegetables, fruits, whole grains, and plant-based proteins, such as beans. Making sauces in large quantities to freeze for later and leaving the skin on your vegetables are also recommended to maximize your experience.  Cooking - Healthy Salads and Dressing Clinical staff conducted group or individual video education with verbal and written material and guidebook.  Patient learns that  vegetables, fruits, whole grains, and legumes are the foundations of the Pritikin Eating Plan. Recommendations include how to incorporate each of these in flavorful and healthy salads, and how to create homemade salad dressings. Proper handling of ingredients is also covered. Cooking - Soups and [employer] - Soups and Desserts Clinical staff conducted group or individual video education with verbal and written material and guidebook.  Patient learns that Pritikin soups and desserts make for easy, nutritious, and delicious snacks and meal components that are low in sodium, fat, sugar, and calorie density, while high in vitamins, minerals, and filling fiber. Recommendations include simple and healthy ideas for soups and desserts.   Overview     The Pritikin Solution Program Overview Clinical staff conducted group or individual video education with verbal and written material and guidebook.  Patient learns that the results of the Pritikin Program have been documented in more than 100 articles published in peer-reviewed journals, and the benefits include reducing risk factors for (and, in some  cases, even reversing) high cholesterol, high blood pressure, type 2 diabetes, obesity, and more! An overview of the three key pillars of the Pritikin Program will be covered: eating well, doing regular exercise, and having a healthy mind-set.  WORKSHOPS  Exercise: Exercise Basics: Building Your Action Plan Clinical staff led group instruction and group discussion with PowerPoint presentation and patient guidebook. To enhance the learning environment the use of posters, models and videos may be added. At the conclusion of this workshop, patients will comprehend the difference between physical activity and exercise, as well as the benefits of incorporating both, into their routine. Patients will understand the FITT (Frequency, Intensity, Time, and Type) principle and how to use it to build an exercise action  plan. In addition, safety concerns and other considerations for exercise and cardiac rehab will be addressed by the presenter. The purpose of this lesson is to promote a comprehensive and effective weekly exercise routine in order to improve patients' overall level of fitness.   Managing Heart Disease: Your Path to a Healthier Heart Clinical staff led group instruction and group discussion with PowerPoint presentation and patient guidebook. To enhance the learning environment the use of posters, models and videos may be added.At the conclusion of this workshop, patients will understand the anatomy and physiology of the heart. Additionally, they will understand how Pritikin's three pillars impact the risk factors, the progression, and the management of heart disease.  The purpose of this lesson is to provide a high-level overview of the heart, heart disease, and how the Pritikin lifestyle positively impacts risk factors.  Exercise Biomechanics Clinical staff led group instruction and group discussion with PowerPoint presentation and patient guidebook. To enhance the learning environment the use of posters, models and videos may be added. Patients will learn how the structural parts of their bodies function and how these functions impact their daily activities, movement, and exercise. Patients will learn how to promote a neutral spine, learn how to manage pain, and identify ways to improve their physical movement in order to promote healthy living. The purpose of this lesson is to expose patients to common physical limitations that impact physical activity. Participants will learn practical ways to adapt and manage aches and pains, and to minimize their effect on regular exercise. Patients will learn how to maintain good posture while sitting, walking, and lifting.  Balance Training and Fall Prevention  Clinical staff led group instruction and group discussion with PowerPoint presentation and  patient guidebook. To enhance the learning environment the use of posters, models and videos may be added. At the conclusion of this workshop, patients will understand the importance of their sensorimotor skills (vision, proprioception, and the vestibular system) in maintaining their ability to balance as they age. Patients will apply a variety of balancing exercises that are appropriate for their current level of function. Patients will understand the common causes for poor balance, possible solutions to these problems, and ways to modify their physical environment in order to minimize their fall risk. The purpose of this lesson is to teach patients about the importance of maintaining balance as they age and ways to minimize their risk of falling.  WORKSHOPS   Nutrition:  Fueling a Ship broker led group instruction and group discussion with PowerPoint presentation and patient guidebook. To enhance the learning environment the use of posters, models and videos may be added. Patients will review the foundational principles of the Pritikin Eating Plan and understand what constitutes a serving size in each of  the food groups. Patients will also learn Pritikin-friendly foods that are better choices when away from home and review make-ahead meal and snack options. Calorie density will be reviewed and applied to three nutrition priorities: weight maintenance, weight loss, and weight gain. The purpose of this lesson is to reinforce (in a group setting) the key concepts around what patients are recommended to eat and how to apply these guidelines when away from home by planning and selecting Pritikin-friendly options. Patients will understand how calorie density may be adjusted for different weight management goals.  Mindful Eating  Clinical staff led group instruction and group discussion with PowerPoint presentation and patient guidebook. To enhance the learning environment the use of posters,  models and videos may be added. Patients will briefly review the concepts of the Pritikin Eating Plan and the importance of low-calorie dense foods. The concept of mindful eating will be introduced as well as the importance of paying attention to internal hunger signals. Triggers for non-hunger eating and techniques for dealing with triggers will be explored. The purpose of this lesson is to provide patients with the opportunity to review the basic principles of the Pritikin Eating Plan, discuss the value of eating mindfully and how to measure internal cues of hunger and fullness using the Hunger Scale. Patients will also discuss reasons for non-hunger eating and learn strategies to use for controlling emotional eating.  Targeting Your Nutrition Priorities Clinical staff led group instruction and group discussion with PowerPoint presentation and patient guidebook. To enhance the learning environment the use of posters, models and videos may be added. Patients will learn how to determine their genetic susceptibility to disease by reviewing their family history. Patients will gain insight into the importance of diet as part of an overall healthy lifestyle in mitigating the impact of genetics and other environmental insults. The purpose of this lesson is to provide patients with the opportunity to assess their personal nutrition priorities by looking at their family history, their own health history and current risk factors. Patients will also be able to discuss ways of prioritizing and modifying the Pritikin Eating Plan for their highest risk areas  Menu  Clinical staff led group instruction and group discussion with PowerPoint presentation and patient guidebook. To enhance the learning environment the use of posters, models and videos may be added. Using menus brought in from [employer], or printed from [employer], patients will apply the Pritikin dining out guidelines that were presented in the  [employer] video. Patients will also be able to practice these guidelines in a variety of provided scenarios. The purpose of this lesson is to provide patients with the opportunity to practice hands-on learning of the Pritikin Dining Out guidelines with actual menus and practice scenarios.  Label Reading Clinical staff led group instruction and group discussion with PowerPoint presentation and patient guidebook. To enhance the learning environment the use of posters, models and videos may be added. Patients will review and discuss the Pritikin label reading guidelines presented in Pritikin's Label Reading Educational series video. Using fool labels brought in from local grocery stores and markets, patients will apply the label reading guidelines and determine if the packaged food meet the Pritikin guidelines. The purpose of this lesson is to provide patients with the opportunity to review, discuss, and practice hands-on learning of the Pritikin Label Reading guidelines with actual packaged food labels. Cooking School  Pritikin's [employer] are designed to teach patients ways to prepare quick, simple, and affordable  recipes at home. The importance of nutrition's role in chronic disease risk reduction is reflected in its emphasis in the overall Pritikin program. By learning how to prepare essential core Pritikin Eating Plan recipes, patients will increase control over what they eat; be able to customize the flavor of foods without the use of added salt, sugar, or fat; and improve the quality of the food they consume. By learning a set of core recipes which are easily assembled, quickly prepared, and affordable, patients are more likely to prepare more healthy foods at home. These workshops focus on convenient breakfasts, simple entres, side dishes, and desserts which can be prepared with minimal effort and are consistent with nutrition recommendations for cardiovascular risk  reduction. Cooking [employer] are taught by a Armed forces logistics/support/administrative officer (RD) who has been trained by the [employer]. The chef or RD has a clear understanding of the importance of minimizing - if not completely eliminating - added fat, sugar, and sodium in recipes. Throughout the series of Cooking School Workshop sessions, patients will learn about healthy ingredients and efficient methods of cooking to build confidence in their capability to prepare    Cooking School weekly topics:  Adding Flavor- Sodium-Free  Fast and Healthy Breakfasts  Powerhouse Plant-Based Proteins  Satisfying Salads and Dressings  Simple Sides and Sauces  International Cuisine-Spotlight on the [employer] Zones  Delicious Desserts  Savory Soups  [employer] - Meals in a Astronomer Appetizers and Snacks  Comforting Weekend Breakfasts  One-Pot Wonders   Fast Evening Meals  Landscape architect Your Pritikin Plate  WORKSHOPS   Healthy Mindset (Psychosocial):  Focused Goals, Sustainable Changes Clinical staff led group instruction and group discussion with PowerPoint presentation and patient guidebook. To enhance the learning environment the use of posters, models and videos may be added. Patients will be able to apply effective goal setting strategies to establish at least one personal goal, and then take consistent, meaningful action toward that goal. They will learn to identify common barriers to achieving personal goals and develop strategies to overcome them. Patients will also gain an understanding of how our mind-set can impact our ability to achieve goals and the importance of cultivating a positive and growth-oriented mind-set. The purpose of this lesson is to provide patients with a deeper understanding of how to set and achieve personal goals, as well as the tools and strategies needed to overcome common obstacles which may arise along the way.  From Head to Heart: The  Power of a Healthy Outlook  Clinical staff led group instruction and group discussion with PowerPoint presentation and patient guidebook. To enhance the learning environment the use of posters, models and videos may be added. Patients will be able to recognize and describe the impact of emotions and mood on physical health. They will discover the importance of self-care and explore self-care practices which may work for them. Patients will also learn how to utilize the 4 C's to cultivate a healthier outlook and better manage stress and challenges. The purpose of this lesson is to demonstrate to patients how a healthy outlook is an essential part of maintaining good health, especially as they continue their cardiac rehab journey.  Healthy Sleep for a Healthy Heart Clinical staff led group instruction and group discussion with PowerPoint presentation and patient guidebook. To enhance the learning environment the use of posters, models and videos may be added. At the conclusion of this workshop, patients will be able to demonstrate knowledge  of the importance of sleep to overall health, well-being, and quality of life. They will understand the symptoms of, and treatments for, common sleep disorders. Patients will also be able to identify daytime and nighttime behaviors which impact sleep, and they will be able to apply these tools to help manage sleep-related challenges. The purpose of this lesson is to provide patients with a general overview of sleep and outline the importance of quality sleep. Patients will learn about a few of the most common sleep disorders. Patients will also be introduced to the concept of "sleep hygiene," and discover ways to self-manage certain sleeping problems through simple daily behavior changes. Finally, the workshop will motivate patients by clarifying the links between quality sleep and their goals of heart-healthy living.   Recognizing and Reducing Stress Clinical staff led  group instruction and group discussion with PowerPoint presentation and patient guidebook. To enhance the learning environment the use of posters, models and videos may be added. At the conclusion of this workshop, patients will be able to understand the types of stress reactions, differentiate between acute and chronic stress, and recognize the impact that chronic stress has on their health. They will also be able to apply different coping mechanisms, such as reframing negative self-talk. Patients will have the opportunity to practice a variety of stress management techniques, such as deep abdominal breathing, progressive muscle relaxation, and/or guided imagery.  The purpose of this lesson is to educate patients on the role of stress in their lives and to provide healthy techniques for coping with it.  Learning Barriers/Preferences:  Learning Barriers/Preferences - 08/30/23 1158       Learning Barriers/Preferences   Learning Barriers None    Learning Preferences Audio;Computer/Internet;Group Instruction;Individual Instruction;Skilled Demonstration;Verbal Instruction;Video;Written Material;Pictoral             Education Topics:  Knowledge Questionnaire Score:  Knowledge Questionnaire Score - 08/30/23 1158       Knowledge Questionnaire Score   Pre Score 23/24             Core Components/Risk Factors/Patient Goals at Admission:  Personal Goals and Risk Factors at Admission - 08/30/23 1201       Core Components/Risk Factors/Patient Goals on Admission    Weight Management Yes;Obesity;Weight Loss    Intervention Weight Management: Develop a combined nutrition and exercise program designed to reach desired caloric intake, while maintaining appropriate intake of nutrient and fiber, sodium and fats, and appropriate energy expenditure required for the weight goal.;Weight Management: Provide education and appropriate resources to help participant work on and attain dietary goals.;Weight  Management/Obesity: Establish reasonable short term and long term weight goals.;Obesity: Provide education and appropriate resources to help participant work on and attain dietary goals.    Expected Outcomes Short Term: Continue to assess and modify interventions until short term weight is achieved;Long Term: Adherence to nutrition and physical activity/exercise program aimed toward attainment of established weight goal;Weight Loss: Understanding of general recommendations for a balanced deficit meal plan, which promotes 1-2 lb weight loss per week and includes a negative energy balance of [id number] kcal/d;Understanding recommendations for meals to include 15-35% energy as protein, 25-35% energy from fat, 35-60% energy from carbohydrates, less than 200mg  of dietary cholesterol, 20-35 gm of total fiber daily;Understanding of distribution of calorie intake throughout the day with the consumption of 4-5 meals/snacks    Diabetes Yes    Intervention Provide education about signs/symptoms and action to take for hypo/hyperglycemia.;Provide education about proper nutrition, including hydration, and aerobic/resistive exercise prescription along  with prescribed medications to achieve blood glucose in normal ranges: Fasting glucose 65-99 mg/dL    Expected Outcomes Short Term: Participant verbalizes understanding of the signs/symptoms and immediate care of hyper/hypoglycemia, proper foot care and importance of medication, aerobic/resistive exercise and nutrition plan for blood glucose control.;Long Term: Attainment of HbA1C < 7%.    Heart Failure Yes    Intervention Provide a combined exercise and nutrition program that is supplemented with education, support and counseling about heart failure. Directed toward relieving symptoms such as shortness of breath, decreased exercise tolerance, and extremity edema.    Expected Outcomes Long term: Adoption of self-care skills and reduction of barriers for early signs and symptoms  recognition and intervention leading to self-care maintenance.;Short term: Daily weights obtained and reported for increase. Utilizing diuretic protocols set by physician.;Short term: Attendance in program 2-3 days a week with increased exercise capacity. Reported lower sodium intake. Reported increased fruit and vegetable intake. Reports medication compliance.;Improve functional capacity of life    Hypertension Yes    Intervention Provide education on lifestyle modifcations including regular physical activity/exercise, weight management, moderate sodium restriction and increased consumption of fresh fruit, vegetables, and low fat dairy, alcohol  moderation, and smoking cessation.;Monitor prescription use compliance.    Expected Outcomes Short Term: Continued assessment and intervention until BP is < 140/2mm HG in hypertensive participants. < 130/18mm HG in hypertensive participants with diabetes, heart failure or chronic kidney disease.;Long Term: Maintenance of blood pressure at goal levels.    Lipids Yes    Intervention Provide education and support for participant on nutrition & aerobic/resistive exercise along with prescribed medications to achieve LDL 70mg , HDL >40mg .    Expected Outcomes Long Term: Cholesterol controlled with medications as prescribed, with individualized exercise RX and with personalized nutrition plan. Value goals: LDL < 70mg , HDL > 40 mg.;Short Term: Participant states understanding of desired cholesterol values and is compliant with medications prescribed. Participant is following exercise prescription and nutrition guidelines.    Stress Yes    Intervention Offer individual and/or small group education and counseling on adjustment to heart disease, stress management and health-related lifestyle change. Teach and support self-help strategies.;Refer participants experiencing significant psychosocial distress to appropriate mental health specialists for further evaluation and  treatment. When possible, include family members and significant others in education/counseling sessions.    Expected Outcomes Short Term: Participant demonstrates changes in health-related behavior, relaxation and other stress management skills, ability to obtain effective social support, and compliance with psychotropic medications if prescribed.;Long Term: Emotional wellbeing is indicated by absence of clinically significant psychosocial distress or social isolation.             Core Components/Risk Factors/Patient Goals Review:   Goals and Risk Factor Review     Row Name 09/17/23 1724 10/05/23 1131           Core Components/Risk Factors/Patient Goals Review   Personal Goals Review Weight Management/Obesity;Heart Failure;Stress;Hypertension;Lipids;Diabetes Weight Management/Obesity;Heart Failure;Stress;Hypertension;Lipids;Diabetes      Review Urie has been doing well with exercise at cardiac rehab. Weight unchanged. Map 90 to low 100's. Will forward vital signs to Dr Jennifer Moellers for review as Audry is going on a crusie with his wife at the end of the week. Advaith has been doing well with exercise at cardiac rehab. Weight up since returning from cruise. [facility] coordinator notified. Keion reports some increased shortness of breath. Map 80's to low 100's. CBG's WNL      Expected Outcomes Vaughan will continue to participate in cardiac rehab for exercise,  nutrition and lifestyle modifications Ryken will continue to participate in cardiac rehab for exercise, nutrition and lifestyle modifications               Core Components/Risk Factors/Patient Goals at Discharge (Final Review):   Goals and Risk Factor Review - 10/05/23 1131       Core Components/Risk Factors/Patient Goals Review   Personal Goals Review Weight Management/Obesity;Heart Failure;Stress;Hypertension;Lipids;Diabetes    Review Kamdyn has been doing well with exercise at cardiac rehab. Weight up since returning from  cruise. [facility] coordinator notified. Pastor reports some increased shortness of breath. Map 80's to low 100's. CBG's WNL    Expected Outcomes Philo will continue to participate in cardiac rehab for exercise, nutrition and lifestyle modifications             ITP Comments:  ITP Comments     Row Name 08/30/23 0928 09/17/23 1716 10/05/23 1128       ITP Comments Dr. Gaylyn Keas medical director. Introduction to pritikin education/intensive cardiac rehab. Initial orientation packet reviewed with patient. 30 Day ITP comments.  Everet has good participation when in attendance at  cardiac rehab 30 Day ITP comments.  Urho has good participation when in attendance at  cardiac rehab. Blu returned to exercise this week after going on a cruise with his wife.              Comments: See ITP Comments

## 2023-10-10 ENCOUNTER — Encounter (HOSPITAL_COMMUNITY)
Admission: RE | Admit: 2023-10-10 | Discharge: 2023-10-10 | Disposition: A | Discharge status: Home / Self Care | Source: Ambulatory Visit | Attending: Internal Medicine | Admitting: Internal Medicine

## 2023-10-10 DIAGNOSIS — Z95811 Presence of heart assist device: Secondary | ICD-10-CM

## 2023-10-10 LAB — GLUCOSE, CAPILLARY: Glucose-Capillary: 317 mg/dL — ABNORMAL HIGH (ref 70–99)

## 2023-10-10 NOTE — Progress Notes (Signed)
 CBG 324. No exercise per protocol. Medications reviewed. Taking as prescribed.l Ivis's PCP Aloha Arnold PAC at [facility] was called and notified.Rich Champ said that he ate two yogurts and had chicken tacos for dinner last night. Will have Sam RD talk with Rich Champ as well. Rondarius plans to return to exercise on Friday. Jawara says that his CBG's were running in the 400's last night.Monte Antonio RN BSN

## 2023-10-12 ENCOUNTER — Telehealth (HOSPITAL_COMMUNITY): Payer: Self-pay | Admitting: *Deleted

## 2023-10-12 ENCOUNTER — Telehealth (HOSPITAL_COMMUNITY): Payer: Self-pay

## 2023-10-12 ENCOUNTER — Encounter (HOSPITAL_COMMUNITY): Admission: RE | Admit: 2023-10-12 | Source: Ambulatory Visit

## 2023-10-12 NOTE — Telephone Encounter (Signed)
 Patient called stating their blood sugar was low this morning and would not be in for class. He is waiting on a call back from his doctor regarding this problem as it has been ongoing.

## 2023-10-12 NOTE — Telephone Encounter (Signed)
 Pt called to report blood sugars have been >400 recently. States he is drinking a lot of slushies lately. Cardiac rehab reached out to pt's PCP earlier this week. Pt reports PCP restarted Jardiance  10 mg daily.   Discussed with Dr Julane Ny. Pt to discuss starting insulin  with PCP. Spoke with pt about Dr Bensimhon recommendation. He plans to contact PCP to make an appt for diabetes management.   Paulo Bosworth RN VAD Coordinator  Office: [phone]  24/7 Pager: [phone]

## 2023-10-15 ENCOUNTER — Encounter (HOSPITAL_COMMUNITY)
Admission: RE | Admit: 2023-10-15 | Discharge: 2023-10-15 | Disposition: A | Discharge status: Home / Self Care | Source: Ambulatory Visit | Attending: Internal Medicine | Admitting: Internal Medicine

## 2023-10-15 ENCOUNTER — Ambulatory Visit (INDEPENDENT_AMBULATORY_CARE_PROVIDER_SITE_OTHER): Payer: BC Managed Care – PPO

## 2023-10-15 DIAGNOSIS — Z95811 Presence of heart assist device: Secondary | ICD-10-CM

## 2023-10-15 DIAGNOSIS — I5022 Chronic systolic (congestive) heart failure: Secondary | ICD-10-CM

## 2023-10-16 ENCOUNTER — Ambulatory Visit (HOSPITAL_COMMUNITY): Payer: Self-pay | Admitting: Pharmacist

## 2023-10-16 LAB — POCT INR: INR: 2.7 (ref 2.0–3.0)

## 2023-10-17 ENCOUNTER — Encounter (HOSPITAL_COMMUNITY): Admission: RE | Admit: 2023-10-17 | Source: Ambulatory Visit

## 2023-10-19 ENCOUNTER — Other Ambulatory Visit (HOSPITAL_COMMUNITY): Payer: Self-pay | Admitting: Unknown Physician Specialty

## 2023-10-19 ENCOUNTER — Encounter (HOSPITAL_COMMUNITY)
Admission: RE | Admit: 2023-10-19 | Discharge: 2023-10-19 | Disposition: A | Discharge status: Home / Self Care | Source: Ambulatory Visit | Attending: Internal Medicine | Admitting: Internal Medicine

## 2023-10-19 DIAGNOSIS — Z95811 Presence of heart assist device: Secondary | ICD-10-CM

## 2023-10-19 DIAGNOSIS — Z7901 Long term (current) use of anticoagulants: Secondary | ICD-10-CM

## 2023-10-19 LAB — CUP PACEART REMOTE DEVICE CHECK
Battery Remaining Longevity: 29 mo
Battery Remaining Percentage: 34 %
Battery Voltage: 2.92 V
Brady Statistic AP VP Percent: 1 %
Brady Statistic AP VS Percent: 1 %
Brady Statistic AS VP Percent: 99 %
Brady Statistic AS VS Percent: 1 %
Brady Statistic RA Percent Paced: 1 %
Date Time Interrogation Session: 20250501125529
HighPow Impedance: 81 Ohm
HighPow Impedance: 81 Ohm
Implantable Lead Connection Status: 753985
Implantable Lead Connection Status: 753985
Implantable Lead Connection Status: 753985
Implantable Lead Implant Date: 20200930
Implantable Lead Implant Date: 20200930
Implantable Lead Implant Date: 20200930
Implantable Lead Location: 753858
Implantable Lead Location: 753859
Implantable Lead Location: 753860
Implantable Pulse Generator Implant Date: 20200930
Lead Channel Impedance Value: 1125 Ohm
Lead Channel Impedance Value: 350 Ohm
Lead Channel Impedance Value: 580 Ohm
Lead Channel Pacing Threshold Amplitude: 0.75 V
Lead Channel Pacing Threshold Amplitude: 1 V
Lead Channel Pacing Threshold Amplitude: 1.25 V
Lead Channel Pacing Threshold Pulse Width: 0.5 ms
Lead Channel Pacing Threshold Pulse Width: 0.5 ms
Lead Channel Pacing Threshold Pulse Width: 0.5 ms
Lead Channel Sensing Intrinsic Amplitude: 1.3 mV
Lead Channel Sensing Intrinsic Amplitude: 12 mV
Lead Channel Setting Pacing Amplitude: 2 V
Lead Channel Setting Pacing Amplitude: 2.5 V
Lead Channel Setting Pacing Amplitude: 2.5 V
Lead Channel Setting Pacing Pulse Width: 0.5 ms
Lead Channel Setting Pacing Pulse Width: 0.5 ms
Lead Channel Setting Sensing Sensitivity: 0.5 mV
Pulse Gen Serial Number: 9901124
Zone Setting Status: 755011

## 2023-10-19 NOTE — Progress Notes (Signed)
 Reviewed home exercise guidelines with [name] including endpoints, temperature precautions, target heart rate and rate of perceived exertion. He is currently walking 15 minutes 2 days/week as his mode of home exercise. He plans to join [name], and his goal is to increase strength so that he can walk more without getting winded. [name] voices understanding of instructions given.  Doree Games, MS, ACSM CEP

## 2023-10-22 ENCOUNTER — Ambulatory Visit (HOSPITAL_COMMUNITY)
Admission: RE | Admit: 2023-10-22 | Discharge: 2023-10-22 | Disposition: A | Discharge status: Home / Self Care | Source: Ambulatory Visit | Attending: Internal Medicine | Admitting: Internal Medicine

## 2023-10-22 ENCOUNTER — Encounter (HOSPITAL_COMMUNITY)

## 2023-10-22 ENCOUNTER — Ambulatory Visit (HOSPITAL_COMMUNITY): Payer: Self-pay | Admitting: Pharmacist

## 2023-10-22 ENCOUNTER — Encounter (HOSPITAL_COMMUNITY): Payer: Self-pay | Admitting: Internal Medicine

## 2023-10-22 VITALS — BP 86/0 | HR 86 | Wt 337.0 lb

## 2023-10-22 DIAGNOSIS — I5022 Chronic systolic (congestive) heart failure: Secondary | ICD-10-CM | POA: Diagnosis present

## 2023-10-22 DIAGNOSIS — E118 Type 2 diabetes mellitus with unspecified complications: Secondary | ICD-10-CM | POA: Diagnosis not present

## 2023-10-22 DIAGNOSIS — I25118 Atherosclerotic heart disease of native coronary artery with other forms of angina pectoris: Secondary | ICD-10-CM | POA: Insufficient documentation

## 2023-10-22 DIAGNOSIS — Z6841 Body Mass Index (BMI) 40.0 and over, adult: Secondary | ICD-10-CM | POA: Insufficient documentation

## 2023-10-22 DIAGNOSIS — Z955 Presence of coronary angioplasty implant and graft: Secondary | ICD-10-CM | POA: Diagnosis not present

## 2023-10-22 DIAGNOSIS — Z7901 Long term (current) use of anticoagulants: Secondary | ICD-10-CM | POA: Insufficient documentation

## 2023-10-22 DIAGNOSIS — Z8674 Personal history of sudden cardiac arrest: Secondary | ICD-10-CM | POA: Diagnosis not present

## 2023-10-22 DIAGNOSIS — Z79899 Other long term (current) drug therapy: Secondary | ICD-10-CM | POA: Insufficient documentation

## 2023-10-22 DIAGNOSIS — Z95811 Presence of heart assist device: Secondary | ICD-10-CM

## 2023-10-22 DIAGNOSIS — G4733 Obstructive sleep apnea (adult) (pediatric): Secondary | ICD-10-CM | POA: Insufficient documentation

## 2023-10-22 DIAGNOSIS — E119 Type 2 diabetes mellitus without complications: Secondary | ICD-10-CM | POA: Diagnosis not present

## 2023-10-22 DIAGNOSIS — Z7984 Long term (current) use of oral hypoglycemic drugs: Secondary | ICD-10-CM | POA: Insufficient documentation

## 2023-10-22 DIAGNOSIS — I110 Hypertensive heart disease with heart failure: Secondary | ICD-10-CM | POA: Diagnosis present

## 2023-10-22 DIAGNOSIS — I2510 Atherosclerotic heart disease of native coronary artery without angina pectoris: Secondary | ICD-10-CM | POA: Diagnosis not present

## 2023-10-22 DIAGNOSIS — Z7985 Long-term (current) use of injectable non-insulin antidiabetic drugs: Secondary | ICD-10-CM | POA: Diagnosis not present

## 2023-10-22 LAB — COMPREHENSIVE METABOLIC PANEL WITH GFR
ALT: 30 U/L (ref 0–44)
AST: 31 U/L (ref 15–41)
Albumin: 3.4 g/dL — ABNORMAL LOW (ref 3.5–5.0)
Alkaline Phosphatase: 69 U/L (ref 38–126)
Anion gap: 8 (ref 5–15)
BUN: 13 mg/dL (ref 6–20)
CO2: 24 mmol/L (ref 22–32)
Calcium: 8.7 mg/dL — ABNORMAL LOW (ref 8.9–10.3)
Chloride: 104 mmol/L (ref 98–111)
Creatinine, Ser: 1.34 mg/dL — ABNORMAL HIGH (ref 0.61–1.24)
GFR, Estimated: >60 mL/min (ref >60)
Glucose, Bld: 247 mg/dL — ABNORMAL HIGH (ref 70–99)
Potassium: 4.2 mmol/L (ref 3.5–5.1)
Sodium: 136 mmol/L (ref 135–145)
Total Bilirubin: 0.7 mg/dL (ref 0.0–1.2)
Total Protein: 6.9 g/dL (ref 6.5–8.1)

## 2023-10-22 LAB — CBC
HCT: 36.8 % — ABNORMAL LOW (ref 39.0–52.0)
Hemoglobin: 12.1 g/dL — ABNORMAL LOW (ref 13.0–17.0)
MCH: 31.3 pg (ref 26.0–34.0)
MCHC: 32.9 g/dL (ref 30.0–36.0)
MCV: 95.1 fL (ref 80.0–100.0)
Platelets: 193 10*3/uL (ref 150–400)
RBC: 3.87 MIL/uL — ABNORMAL LOW (ref 4.22–5.81)
RDW: 14.7 % (ref 11.5–15.5)
WBC: 6 10*3/uL (ref 4.0–10.5)
nRBC: 0 % (ref 0.0–0.2)

## 2023-10-22 LAB — LACTATE DEHYDROGENASE: LDH: 173 U/L (ref 98–192)

## 2023-10-22 LAB — PROTIME-INR
INR: 2 — ABNORMAL HIGH (ref 0.8–1.2)
Prothrombin Time: 23.2 s — ABNORMAL HIGH (ref 11.4–15.2)

## 2023-10-22 LAB — TSH: TSH: 3.468 u[IU]/mL (ref 0.350–4.500)

## 2023-10-22 LAB — T4, FREE: Free T4: 0.91 ng/dL (ref 0.61–1.12)

## 2023-10-22 LAB — PREALBUMIN: Prealbumin: 27 mg/dL (ref 18–38)

## 2023-10-22 LAB — DIGOXIN LEVEL: Digoxin Level: 0.4 ng/mL — ABNORMAL LOW (ref 0.8–2.0)

## 2023-10-22 NOTE — Patient Instructions (Signed)
 No medication changes Coumadin  dosing per Barbra Ley [facility] Return to [facility] in 2 months May begin to shower. Please clean showerhead before each shower with Clorox. See instructions below: Provided patient with shower bag for home use. Demonstration along with written instructions and illustrated steps provided.  Patient and caregiver verbalized understanding of same.    Here are some tips for washing:  Avoid getting the driveline exit site dressing wet, and consider planning bathing times around exit site dressing changes. Use only the shower bag we gave you to shower with and don't get creative with your equipment to shower. Water and electricity do not mix and will cause your pump to stop.   Sit on a chair or bathing stool in the bathtub or shower stall, and use a basin of warm water and washcloth or sponge to wash. Or, wash at the sink while standing on a towel, so as not to get the floor or bath rug wet. To wash your hair, try using a hand-held shower wand or sprayer while standing over the kitchen sink or bathtub. Be sure that all floor surfaces are dry when walking around after bathing, to avoid slipping. Do not use powder around the exit site dressing. Anytime your dressing appears wet CHANGE IT! Drink 2 large glasses of water prior to getting in shower for first time (always hydrate before shower). Caregiver needs to be accessible during first few showers. Call VAD Coordinator if any changes in appearance of drive line site.

## 2023-10-22 NOTE — Progress Notes (Addendum)
 Pt presents for 2 month f/u in [facility] Clinic today with his wife. Reports no problems with VAD equipment or concerns with drive line.  Pt walked into VAD Clinic unassisted. Reports intermittent dizziness and lightheadedness, but this has improved over time. He is drinking 2L/day. Pt states that he has shortness of breath during exerction especially with inclines but this is improving with rehab. Pt currently in cardiac rehab 3 times a week. His weight is up 4 lbs today. He denies falls and signs of bleeding. He has been taking Torsemide  40 mg daily with good urine output.   Tolerating speed 5800. Less PI events/speed drops noted with speed decrease. RHC 08/06/23- RV failure marginal. PAPI 2.0.    Pt has been drinking slushies daily to help quench his thirst. Reports he is going through a 2L of crush every 2 days. He saw his PCP for hyperglycemia recently and they started him on Jardiance  25mg  daily along with Metformin  1000mg  BID. Discussed looking for sugar free popsicle options to help prevent blood sugar spikes. Discussed with Dr. Julane Ny will send referral to Endocrinology today.  Vital Signs:  Doppler Pressure: 86 Automatic BP: 84/64 (71) HR: 97 NSR SPO2: 96%   Weight: 337 lb w/ eqt Last weight: 323.6 lb w/ eqt BMI today 39.87 today   VAD Indication: Destination therapy - Seen by [facility] for transplant. Transplant team advised for VAD. Once pt lost 25lbs they would reconsider for transplant.   LVAD assessment: HM III : Speed: 5800 rpms Flow: 5.3 Power: 4.7 w    PI: 3.3 Alarms: none Events: rare PI events  Fixed speed: 5800 Low speed limit: 5500   Primary controller: back up battery due for replacement in 24 months Secondary controller:  back up battery due for replacement in 26 months   I reviewed the LVAD parameters from today and compared the results to the patient's prior recorded data. LVAD interrogation was NEGATIVE for significant power changes, NEGATIVE for clinical alarms  and STABLE for PI events/speed drops. No programming changes were made and pump is functioning within specified parameters. Pt is performing daily controller and system monitor self tests along with completing weekly and monthly maintenance for LVAD equipment.   LVAD equipment check completed and is in good working order. Back-up equipment present. Charged back up battery and performed self-test on equipment.    Annual Equipment Maintenance on UBC/PM was performed on 05/03/2023. Patient completed 1300 feet during 6 minute walk test. Tolerated well.   Neurocognitive trail making completed correctly in 2 m 16 s.   [place] Cardiomyopathy, EQ-5D-3L and post-VAD QOL completed by the patient independently.   [place] Cardiomyopathy Questionnaire     10/22/2023    2:14 PM 07/19/2023    1:38 PM  KCCQ-12  1 a. Ability to shower/bathe Moderately limited Other, Did not do  1 b. Ability to walk 1 block Moderately limited Moderately limited  1 c. Ability to hurry/jog Extremely limited Other, Did not do  2. Edema feet/ankles/legs 1-2 times a week 1-2 times a week  3. Limited by fatigue Several times a day All of the time  4. Limited by dyspnea At least once a day Several times a day  5. Sitting up / on 3+ pillows Every night 1-2 times a week  6. Limited enjoyment of life Moderately limited Moderately limited  7. Rest of life w/ symptoms Somewhat satisfied Somewhat satisfied  8 a. Participation in hobbies Limited quite a bit Severely limited  8 b. Participation  in chores Severely limited Limited quite a bit  8 c. Visiting family/friends Moderately limited Moderately limited     Exit Site Care: Dressing change performed weekly on Fridays using daily kit by pt's wife Lenore at home. Existing VAD dressing removed and site care performed using sterile technique. Drive line exit site cleaned with Chlora prep applicators x 2, rinsed with sterile saline and allowed to dry. Covered with Silverlon patch  and Sorbaview dressing. Exit site is incorporated, the velour is fully implanted at exit site. No redness, drainage, foul odor or rash noted. Drive line anchor re-applied. Continue weekly dressing changes using weekly kits. Pt given 8 weekly kits and 4 anchors. It appears the redness around his driveline is his skin pigment--all his other scars from surgery appear the same.  Pt may shower. Provided patient with shower bag for home use. Demonstration along with written instructions and illustrated steps provided.  Patient and caregiver verbalized understanding of same.    The following education on showering with VAD provided to patient: Avoid getting the driveline exit site dressing wet, and consider planning bathing times around exit site dressing changes. Advised to clean shower head with Clorox prior to each shower.  Use only the shower bag we gave you to shower with and don't get creative with your equipment to shower. Water and electricity do not mix and will cause your pump to stop.   Sit on a chair or bathing stool in the bathtub or shower stall, and use a basin of warm water and washcloth or sponge to wash. Or, wash at the sink while standing on a towel, so as not to get the floor or bath rug wet. To wash your hair, try using a hand-held shower wand or sprayer while standing over the kitchen sink or bathtub. Be sure that all floor surfaces are dry when walking around after bathing, to avoid slipping. Do not use powder around the exit site dressing. Anytime your dressing appears wet CHANGE IT! Drink 2 large glasses of water prior to getting in shower for first time (always hydrate before shower). Caregiver needs to be accessible during first few showers. Call VAD Coordinator if any changes in appearance of drive line site.  Significant Events on VAD Support:      Device: St Jude Therapies: ON VF 200 Last check: 04/16/23   BP & Labs:  MAP 86 - Doppler is reflecting Modified systolic    Hgb 12.1 - No S/S of bleeding. Specifically denies melena/BRBPR or nosebleeds.   LDH stable at 173 with established baseline of 190- 245. Denies tea-colored urine. No power elevations noted on interrogation.  Plan: No medication changes Coumadin  dosing per [facility] Le[facility] Return to VAD Clinic in 2 months May begin to shower. Please clean showerhead before each shower with Clorox. See instructions on AVS.  Laurice Pope RN,BSN VAD Coordinator  Office: [phone]  24/7 Pager: [phone]

## 2023-10-23 LAB — T3, FREE: T3, Free: 2.7 pg/mL (ref 2.0–4.4)

## 2023-10-24 ENCOUNTER — Encounter (HOSPITAL_COMMUNITY): Admission: RE | Admit: 2023-10-24 | Source: Ambulatory Visit

## 2023-10-25 ENCOUNTER — Telehealth (HOSPITAL_COMMUNITY): Payer: Self-pay | Admitting: *Deleted

## 2023-10-25 NOTE — Telephone Encounter (Signed)
 Spoke with Rich Champ he plans to return to exercise at cardiac rehab tomorrow.Monte Antonio RN BSN

## 2023-10-26 ENCOUNTER — Encounter (HOSPITAL_COMMUNITY)
Admission: RE | Admit: 2023-10-26 | Discharge: 2023-10-26 | Disposition: A | Discharge status: Home / Self Care | Source: Ambulatory Visit | Attending: Internal Medicine

## 2023-10-26 DIAGNOSIS — Z95811 Presence of heart assist device: Secondary | ICD-10-CM | POA: Diagnosis not present

## 2023-10-28 NOTE — Progress Notes (Signed)
 LVAD CLINIC NOTE  PCP: Primary Cardiologist: Ralene Burger, MD HF MD: DB   Chief complaint: HF  HPI:  Joe [name] is a 53 y.o. male with h/o morbid obesity, DM2, HTN, HL, OSA, CAD s/p multiple stents to LAD and LCX  and systolic HF s/[ Abbott CRT-D. Now s/p HM3 LVAD Implantation 11/14 by Dr. Matt Song.  Underwent RHC and Ramp Echo 08/06/23  LVAD speed 5800 Flow 5.4 L/min Rare PI events   RHC 08/14/23  RA = 5 RV = 27/5 PA = 21/9 (14) PCW = 4 Fick cardiac output/index = 5.9/2.2 TD CO/CI = 5.8/2.2 PVR = 1.7 WU Ao sat = 94% PA sat = 61%,64% PAPi = 2.4  Follow up for Heart Failure/LVAD: Here for routine f/u with his wife. Doing well. Active. Just got back from a cruise and planning to go to [facility]. Going to [place]. Stamina improving. Still SOB with hills. Weight is up nearly 20 pounds over last few months despite Ozempic but has not been watching his diet closely. Drinking a lot of slushies. Denies orthopnea or PND. No fevers, chills or problems with driveline. No bleeding, melena or neuro symptoms. No VAD alarms. Taking all meds as prescribed.  Vital Signs:  Doppler Pressure: 86 Automatic BP: 84/64 (71) HR: 97 NSR SPO2: 96%   Weight: 337 lb w/ eqt Last weight: 323.6 lb w/ eqt BMI today 39.87 today   VAD Indication: Destination therapy - Seen by [facility] for transplant. Transplant team advised for VAD. Once pt lost 25lbs they would reconsider for transplant.   LVAD assessment: HM III : Speed: 5800 rpms Flow: 5.3 Power: 4.7 w    PI: 3.3 Alarms: none Events: rare PI events  Fixed speed: 5800 Low speed limit: 5500   Primary controller: back up battery due for replacement in 24 months Secondary controller:  back up battery due for replacement in 26 months   I reviewed the LVAD parameters from today and compared the results to the patient's prior recorded data. LVAD interrogation was NEGATIVE for significant power changes, NEGATIVE for clinical alarms and STABLE  for PI events/speed drops. No programming changes were made and pump is functioning within specified parameters. Pt is performing daily controller and system monitor self tests along with completing weekly and monthly maintenance for LVAD equipment.   LVAD equipment check completed and is in good working order. Back-up equipment present. Charged back up battery and performed self-test on equipment.    Annual Equipment Maintenance on UBC/PM was performed on 05/03/2023. Patient completed 1300 feet during 6 minute walk test. Tolerated well.    Past Medical History:  Diagnosis Date   Abnormal EKG    Acute systolic heart failure (HCC) 09/06/2017   Angina pectoris (HCC) 09/05/2017   Body mass index 45.0-49.9, adult (HCC)    CAD S/P percutaneous coronary angioplasty 09/07/2017   mLAD and dLAD PCI with DES 09/06/17   CHF (congestive heart failure) (HCC)    Chronic systolic (congestive) heart failure (HCC) 03/19/2019   Complication of anesthesia 1995   "had hard time waking up; breathing w/vasectomy"   Coronary artery disease    Erectile dysfunction    Essential hypertension, benign    Fatigue    GERD (gastroesophageal reflux disease)    Gout    High cholesterol    "just started tx yesterday" (09/06/2017)   History of gout    "RX prn" (09/06/2017)   Ischemic cardiomyopathy 09/07/2017   EF 25-35%   Muscular chest pain  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   Below Left Nare (MOH's)   Obesity    Obstructive sleep apnea    OSA on CPAP    Plantar fasciitis    Pre-operative clearance 02/11/2018   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/02/2019   Saint Jude   Right bundle branch block    Shortness of breath 09/05/2017   Testosterone deficiency    Type 2 diabetes mellitus with complication, without long-term current use of insulin  (HCC) 09/05/2017   Type II diabetes mellitus (HCC)    "started tx 08/28/2017"    Current Outpatient Medications  Medication  Sig Dispense Refill   acetaminophen  (TYLENOL ) 500 MG tablet Take 1,000 mg by mouth every 6 (six) hours as needed for moderate pain or headache.     allopurinol  (ZYLOPRIM ) 100 MG tablet Take 100 mg by mouth in the morning.     amiodarone  (PACERONE ) 200 MG tablet Take 1 tablet (200 mg total) by mouth daily. 90 tablet 3   colchicine 0.6 MG tablet Take 0.6 mg by mouth daily as needed (Gout).     diclofenac  Sodium (VOLTAREN ) 1 % GEL APPLY 1 G TOPICALLY DAILY. (Patient taking differently: Apply 1 g topically 4 (four) times daily as needed (pain.).) 100 g 0   digoxin  (LANOXIN ) 0.125 MG tablet Take 1 tablet (0.125 mg total) by mouth daily. 90 tablet 3   escitalopram  (LEXAPRO ) 10 MG tablet Take 10 mg by mouth at bedtime.     ezetimibe  (ZETIA ) 10 MG tablet Take 1 tablet (10 mg total) by mouth daily. 90 tablet 3   gabapentin  (NEURONTIN ) 300 MG capsule Take 300 mg by mouth 3 (three) times daily.     levothyroxine  (SYNTHROID ) 25 MCG tablet Take 25 mcg by mouth daily before breakfast.     metFORMIN  (GLUCOPHAGE -XR) 500 MG 24 hr tablet Take 1,000 mg by mouth in the morning and at bedtime.     mexiletine (MEXITIL ) 250 MG capsule Take 1 capsule (250 mg total) by mouth 2 (two) times daily. 180 capsule 3   multivitamin (ONE-A-DAY MEN'S) TABS tablet Take 1 tablet by mouth in the morning.     omeprazole  (PRILOSEC) 40 MG capsule Take 1 capsule (40 mg total) by mouth daily. 90 capsule 1   ondansetron  (ZOFRAN -ODT) 4 MG disintegrating tablet Take 1 tablet (4 mg total) by mouth every 8 (eight) hours as needed for nausea or vomiting. 20 tablet 0   OZEMPIC, 2 MG/DOSE, 8 MG/3ML SOPN Inject 2 mg into the skin every Monday.     Polyethylene Glycol 400 (BLINK TEARS OP) Place 2 drops into both eyes 4 (four) times daily as needed (for dry eyes).     potassium chloride  SA (KLOR-CON  M) 20 MEQ tablet Take 3 tablets (60 mEq total) by mouth 2 (two) times daily. 540 tablet 1   rosuvastatin  (CRESTOR ) 40 MG tablet Take 1 tablet (40 mg  total) by mouth daily. 90 tablet 2   sildenafil  (REVATIO ) 20 MG tablet Take 1 tablet (20 mg total) by mouth 3 (three) times daily. 90 tablet 5   sildenafil  (VIAGRA ) 50 MG tablet Take 1 tablet (50 mg total) by mouth daily as needed for erectile dysfunction. 10 tablet 6   spironolactone  (ALDACTONE ) 25 MG tablet Take 1 tablet (25 mg total) by mouth daily. 90 tablet 3   torsemide  (DEMADEX ) 20 MG tablet TAKE 2 TABLETS (40 MG TOTAL) BY MOUTH DAILY. 180 tablet 2   warfarin (COUMADIN ) 5 MG tablet Take 1.5 tablets (7.5  mg total) by mouth daily. (Patient taking differently: Take 7.5 mg by mouth every evening.) 60 tablet 5   No current facility-administered medications for this encounter.      Vitals:   10/22/23 1214 10/22/23 1215  BP: (!) 84/64 (!) 86/0  Pulse: 86   SpO2: 96%   Weight: (!) 152.9 kg (337 lb)      Vital Signs:  Doppler Pressure: 98 Automatic BP: 108/79 (89) HR: 68 NSR SPO2: 96%   Weight: 323.6 lb w/ eqt Last weight: 319 lb w/ eqt BMI today 39.87 today   Physical Exam: General:  NAD.  HEENT: normal  Neck: supple. JVP not elevated.  Carotids 2+ bilat; no bruits. No lymphadenopathy or thryomegaly appreciated. Cor: LVAD hum.  Lungs: Clear. Abdomen: obese soft, nontender, non-distended. No hepatosplenomegaly. No bruits or masses. Good bowel sounds. Driveline site clean. Anchor in place.  Extremities: no cyanosis, clubbing, rash. Warm no edema  Neuro: alert & oriented x 3. No focal deficits. Moves all 4 without problem     ASSESSMENT AND PLAN:    1.  Chronic Systolic HF s/p HM3 Implantation 11/14 - due to iCM - Echo 05/01/23: EF 20%, RV moderately down (improved) - RHC (9/24): elevated biventricular filling pressures with low PAPi; CI 2.2 - S/p HM III LVAD 05/03/23 by Dr. Jeb Miner - RAMP echo 11/21: Speeds left at 6000.  Speed increased to 6100 on 11/27. Speed increased to 6200 on 05/18/23.  - Continue sildenafil  and digoxin  for RV failure.  - Had been struggling  with RV failure. VAD speed turned down to 5800. RHC 2/25 showed well compensated hemodynamics with dry weight 307-309  - Stable/improved NYHA II - Based on RHC fluid status optimized on torsemide  40 daily. Can take extra as needed but be careful not to overdiurese for non-fluid weight gain - Continue compression hose - Continue spiro 25 - Has been seen at [facility] and will consider for transplant once BMI down but he is struggling to lose weight. Body mass index is 42.69 kg/m. - Continue CR  2. LVAD management - s/p HM-3 implant 05/03/23 - Denies fevers or chills. No bleeding, neuro sx or problems with driveline.  - INR 2.0 Goal 2.0-3.0 Discussed warfarin dosing with PharmD personally. - LDH 173 - MAPs ok - DL site ok   3. H/o VT Arrest and Hx PVCs - had VT arrest x 2 in 10/24 - Currently on Amio 200 mg daily and Mexiletine 250 mg bid.  - Can drop amio to 100 daily at next visit   4. CAD with chronic stable angina - s/p multiple stents to LAD and LCX - LHC (9/24) with stable 3v CAD - No s/s anginaa   5. DM2 - A1c 7.5 (10/24) - Per PCP   6. OSA - Not using CPAP, says machine was recalled a while ago. - Need to get new device   7. Morbid obesity - Continues to gain weight despite Ozempic - Body mass index is 42.69 kg/m. - Understands need to get BMI down near 35 for transplant - Consider switch to Mounjaro  Niyana Chesbro, MD  1:23 PM

## 2023-10-29 ENCOUNTER — Ambulatory Visit (HOSPITAL_COMMUNITY): Payer: Self-pay | Admitting: Pharmacist

## 2023-10-29 ENCOUNTER — Encounter (HOSPITAL_COMMUNITY)
Admission: RE | Admit: 2023-10-29 | Discharge: 2023-10-29 | Disposition: A | Discharge status: Home / Self Care | Source: Ambulatory Visit | Attending: Internal Medicine | Admitting: Internal Medicine

## 2023-10-29 DIAGNOSIS — Z95811 Presence of heart assist device: Secondary | ICD-10-CM

## 2023-10-29 LAB — POCT INR: INR: 2.1 (ref 2.0–3.0)

## 2023-10-31 ENCOUNTER — Encounter (HOSPITAL_COMMUNITY)
Admission: RE | Admit: 2023-10-31 | Discharge: 2023-10-31 | Disposition: A | Discharge status: Home / Self Care | Source: Ambulatory Visit | Attending: Internal Medicine | Admitting: Internal Medicine

## 2023-10-31 VITALS — BP 96/0 | Wt 332.7 lb

## 2023-10-31 DIAGNOSIS — Z95811 Presence of heart assist device: Secondary | ICD-10-CM | POA: Diagnosis not present

## 2023-11-02 ENCOUNTER — Encounter (HOSPITAL_COMMUNITY): Admission: RE | Admit: 2023-11-02 | Source: Ambulatory Visit

## 2023-11-05 ENCOUNTER — Encounter (HOSPITAL_COMMUNITY)
Admission: RE | Admit: 2023-11-05 | Discharge: 2023-11-05 | Disposition: A | Discharge status: Home / Self Care | Source: Ambulatory Visit | Attending: Internal Medicine

## 2023-11-06 ENCOUNTER — Ambulatory Visit (HOSPITAL_COMMUNITY): Payer: Self-pay | Admitting: Pharmacist

## 2023-11-06 LAB — POCT INR: INR: 1.8 — AB (ref 2.0–3.0)

## 2023-11-07 ENCOUNTER — Encounter (HOSPITAL_COMMUNITY): Admission: RE | Admit: 2023-11-07 | Source: Ambulatory Visit

## 2023-11-07 NOTE — Progress Notes (Signed)
 Cardiac Individual Treatment Plan  Patient Details  Name: Joe [name] MRN: [id number] Date of Birth: [birth year] Referring Provider:   Flowsheet Row INTENSIVE CARDIAC REHAB ORIENT from 08/30/2023 in [facility]  Referring Provider Jules Oar, MD       Initial Encounter Date:  Flowsheet Row INTENSIVE CARDIAC REHAB ORIENT from 08/30/2023 in [facility]  Date 08/30/23       Visit Diagnosis: 05/02/24 LVAD, HM3  Patient's Home Medications on Admission:  Current Outpatient Medications:    acetaminophen  (TYLENOL ) 500 MG tablet, Take 1,000 mg by mouth every 6 (six) hours as needed for moderate pain or headache., Disp: , Rfl:    allopurinol  (ZYLOPRIM ) 100 MG tablet, Take 100 mg by mouth in the morning., Disp: , Rfl:    amiodarone  (PACERONE ) 200 MG tablet, Take 1 tablet (200 mg total) by mouth daily., Disp: 90 tablet, Rfl: 3   colchicine 0.6 MG tablet, Take 0.6 mg by mouth daily as needed (Gout)., Disp: , Rfl:    diclofenac  Sodium (VOLTAREN ) 1 % GEL, APPLY 1 G TOPICALLY DAILY. (Patient taking differently: Apply 1 g topically 4 (four) times daily as needed (pain.).), Disp: 100 g, Rfl: 0   digoxin  (LANOXIN ) 0.125 MG tablet, Take 1 tablet (0.125 mg total) by mouth daily., Disp: 90 tablet, Rfl: 3   escitalopram  (LEXAPRO ) 10 MG tablet, Take 10 mg by mouth at bedtime., Disp: , Rfl:    ezetimibe  (ZETIA ) 10 MG tablet, Take 1 tablet (10 mg total) by mouth daily., Disp: 90 tablet, Rfl: 3   gabapentin  (NEURONTIN ) 300 MG capsule, Take 300 mg by mouth 3 (three) times daily., Disp: , Rfl:    levothyroxine  (SYNTHROID ) 25 MCG tablet, Take 25 mcg by mouth daily before breakfast., Disp: , Rfl:    metFORMIN  (GLUCOPHAGE -XR) 500 MG 24 hr tablet, Take 1,000 mg by mouth in the morning and at bedtime., Disp: , Rfl:    mexiletine (MEXITIL ) 250 MG capsule, Take 1 capsule (250 mg total) by  mouth 2 (two) times daily., Disp: 180 capsule, Rfl: 3   multivitamin (ONE-A-DAY MEN'S) TABS tablet, Take 1 tablet by mouth in the morning., Disp: , Rfl:    omeprazole  (PRILOSEC) 40 MG capsule, Take 1 capsule (40 mg total) by mouth daily., Disp: 90 capsule, Rfl: 1   ondansetron  (ZOFRAN -ODT) 4 MG disintegrating tablet, Take 1 tablet (4 mg total) by mouth every 8 (eight) hours as needed for nausea or vomiting., Disp: 20 tablet, Rfl: 0   OZEMPIC, 2 MG/DOSE, 8 MG/3ML SOPN, Inject 2 mg into the skin every Monday., Disp: , Rfl:    Polyethylene Glycol 400 (BLINK TEARS OP), Place 2 drops into both eyes 4 (four) times daily as needed (for dry eyes)., Disp: , Rfl:    potassium chloride  SA (KLOR-CON  M) 20 MEQ tablet, Take 3 tablets (60 mEq total) by mouth 2 (two) times daily., Disp: 540 tablet, Rfl: 1   rosuvastatin  (CRESTOR ) 40 MG tablet, Take 1 tablet (40 mg total) by mouth daily., Disp: 90 tablet, Rfl: 2   sildenafil  (REVATIO ) 20 MG tablet, Take 1 tablet (20 mg total) by mouth 3 (three) times daily., Disp: 90 tablet, Rfl: 5   sildenafil  (VIAGRA ) 50 MG tablet, Take 1 tablet (50 mg total) by mouth daily as needed for erectile dysfunction., Disp: 10 tablet, Rfl: 6   spironolactone  (ALDACTONE ) 25 MG tablet, Take 1 tablet (25 mg total) by mouth  daily., Disp: 90 tablet, Rfl: 3   torsemide  (DEMADEX ) 20 MG tablet, TAKE 2 TABLETS (40 MG TOTAL) BY MOUTH DAILY., Disp: 180 tablet, Rfl: 2   warfarin (COUMADIN ) 5 MG tablet, Take 1.5 tablets (7.5 mg total) by mouth daily. (Patient taking differently: Take 7.5 mg by mouth every evening.), Disp: 60 tablet, Rfl: 5  Past Medical History: Past Medical History:  Diagnosis Date   Abnormal EKG    Acute systolic heart failure (HCC) 09/06/2017   Angina pectoris (HCC) 09/05/2017   Body mass index 45.0-49.9, adult (HCC)    CAD S/P percutaneous coronary angioplasty 09/07/2017   mLAD and dLAD PCI with DES 09/06/17   CHF (congestive heart failure) (HCC)    Chronic systolic  (congestive) heart failure (HCC) 03/19/2019   Complication of anesthesia 1995   "had hard time waking up; breathing w/vasectomy"   Coronary artery disease    Erectile dysfunction    Essential hypertension, benign    Fatigue    GERD (gastroesophageal reflux disease)    Gout    High cholesterol    "just started tx yesterday" (09/06/2017)   History of gout    "RX prn" (09/06/2017)   Ischemic cardiomyopathy 09/07/2017   EF 25-35%   Muscular chest pain    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   Below Left Nare (MOH's)   Obesity    Obstructive sleep apnea    OSA on CPAP    Plantar fasciitis    Pre-operative clearance 02/11/2018   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/02/2019   [employer]   Right bundle branch block    Shortness of breath 09/05/2017   Testosterone deficiency    Type 2 diabetes mellitus with complication, without long-term current use of insulin  (HCC) 09/05/2017   Type II diabetes mellitus (HCC)    "started tx 08/28/2017"    Tobacco Use: Social History   Tobacco Use  Smoking Status Never  Smokeless Tobacco Never    Labs: Review Flowsheet  More data exists      Latest Ref Rng & Units 05/19/2023 05/20/2023 05/21/2023 07/19/2023 08/06/2023  Labs for ITP Cardiac and Pulmonary Rehab  Hemoglobin A1c 4.8 - 5.6 % - - - 9.0  -  Bicarbonate 20.0 - 28.0 mmol/L - - - - 29.7  30.8   TCO2 22 - 32 mmol/L - - - - 31  32   O2 Saturation % 65.8  56.7  60.2  - 61  64     Details       Multiple values from one day are sorted in reverse-chronological order         Capillary Blood Glucose: Lab Results  Component Value Date   GLUCAP 317 (H) 10/10/2023   GLUCAP 272 (H) 10/08/2023   GLUCAP 242 (H) 10/03/2023   GLUCAP 247 (H) 09/03/2023   GLUCAP 260 (H) 09/03/2023     Exercise Target Goals: Exercise Program Goal: Individual exercise prescription set using results from initial 6 min walk test and THRR while considering  patient's  activity barriers and safety.   Exercise Prescription Goal: Initial exercise prescription builds to 30-45 minutes a day of aerobic activity, 2-3 days per week.  Home exercise guidelines will be given to patient during program as part of exercise prescription that the participant will acknowledge.  Activity Barriers & Risk Stratification:   6 Minute Walk:  6 Minute Walk     Row Name 08/30/23 1144         6 Minute  Walk   Phase Initial     Distance 1440 feet     Walk Time 6 minutes     # of Rest Breaks 0     MPH 2.73     METS 2.89     RPE 13     Perceived Dyspnea  2     VO2 Peak 10.12     Symptoms Yes (comment)     Comments SOB, resolved with rest     Resting HR 77 bpm     Resting BP 84/0  MAP     Resting Oxygen Saturation  97 %     Exercise Oxygen Saturation  during 6 min walk 97 %     Max Ex. HR 100 bpm     Max Ex. BP 86/0  MAP     2 Minute Post BP 92/0  MAP              Oxygen Initial Assessment:   Oxygen Re-Evaluation:   Oxygen Discharge (Final Oxygen Re-Evaluation):   Initial Exercise Prescription:  Initial Exercise Prescription - 08/30/23 1100       Date of Initial Exercise RX and Referring Provider   Date 08/30/23    Referring Provider Jules Oar, MD    Expected Discharge Date 11/21/23      NuStep   Level 1    SPM 65    Minutes 15    METs 2.5      Track   Laps 10    Minutes 15    METs 2.5      Prescription Details   Frequency (times per week) 3    Duration Progress to 30 minutes of continuous aerobic without signs/symptoms of physical distress      Intensity   THRR 40-80% of Max Heartrate 67-134    Ratings of Perceived Exertion 11-13    Perceived Dyspnea 0-4      Progression   Progression Continue progressive overload as per policy without signs/symptoms or physical distress.      Resistance Training   Training Prescription Yes    Weight 3    Reps 10-15             Perform Capillary Blood Glucose checks as  needed.  Exercise Prescription Changes:   Exercise Prescription Changes     Row Name 09/03/23 1200 09/17/23 1021 10/08/23 1032 10/15/23 1025 10/29/23 1029     Response to Exercise   Blood Pressure (Admit) --  86 MAP 102/0  MAP 102/0  MAP 100/0  MAP 98/0  MAP   Blood Pressure (Exercise) --  104 MAP 106/0  MAP 106/0  MAP 98/0  MAP --  MAP   Blood Pressure (Exit) --  96 MAP 90/0  MAP 100/0  MAP 88/0  MAP 86/0  MAP   Heart Rate (Admit) 73 bpm 66 bpm 67 bpm 80 bpm 78 bpm   Heart Rate (Exercise) 109 bpm 104 bpm 95 bpm 106 bpm 96 bpm   Heart Rate (Exit) 81 bpm 75 bpm 76 bpm 71 bpm 87 bpm   Rating of Perceived Exertion (Exercise) 15 12 11 11 11    Symptoms None None None None None   Comments Pt first day -- -- -- --   Duration Continue with 30 min of aerobic exercise without signs/symptoms of physical distress. Continue with 30 min of aerobic exercise without signs/symptoms of physical distress. Continue with 30 min of aerobic exercise without signs/symptoms of physical distress. Continue with 30 min of  aerobic exercise without signs/symptoms of physical distress. Continue with 30 min of aerobic exercise without signs/symptoms of physical distress.   Intensity THRR unchanged THRR unchanged THRR unchanged THRR unchanged THRR unchanged     Progression   Progression Continue to progress workloads to maintain intensity without signs/symptoms of physical distress. Continue to progress workloads to maintain intensity without signs/symptoms of physical distress. Continue to progress workloads to maintain intensity without signs/symptoms of physical distress. Continue to progress workloads to maintain intensity without signs/symptoms of physical distress. Continue to progress workloads to maintain intensity without signs/symptoms of physical distress.   Average METs 2.2 2.6 2.8 3.2 2.9     Resistance Training   Training Prescription Yes Yes Yes Yes Yes   Weight 3 3 lbs 3 lbs 4 lbs 4 lbs   Reps 10-15 10-15  10-15 10-15 10-15   Time -- 10 Minutes 10 Minutes 10 Minutes 10 Minutes     Interval Training   Interval Training -- No No No No     NuStep   Level 1 1 2 2 2    SPM 67 91 105 120 107   Minutes 15 15 15 15 15    METs 1.6 2.4 2.8 3.3 2.7     Track   Laps 14 15 14 17 16    Minutes 15 15 15 15 15    METs 2.79 2.91 2.79 3.17 3.04     Home Exercise Plan   Plans to continue exercise at -- -- -- -- Home (comment)  Walking   Frequency -- -- -- -- Add 2 additional days to program exercise sessions.   Initial Home Exercises Provided -- -- -- -- 10/19/23            Exercise Comments:   Exercise Comments     Row Name 09/03/23 1156 09/19/23 1130 10/19/23 1111       Exercise Comments Pt first full day in program. Pt completed 15 minutes of track walking and 15 minutes of the NuStep averaging 2.2 METs for his aerobic exercise. Pt reported an RPE of 15 on the NuStep at level 1 and was asked to decrease his pace. Pt completed resistance exercise with 3lb weights and cool-down without unusual signs or symptoms. Telemetry rhythm unchanged from orientation. Reviewed METs with Kalden. He will be absent the next 2 weeks on a cruise. Reviewed home exercise guidelines and goals with Mansoor.              Exercise Goals and Review:   Exercise Goals     Row Name 08/30/23 0929             Exercise Goals   Increase Physical Activity Yes       Intervention Provide advice, education, support and counseling about physical activity/exercise needs.;Develop an individualized exercise prescription for aerobic and resistive training based on initial evaluation findings, risk stratification, comorbidities and participant's personal goals.       Expected Outcomes Long Term: Exercising regularly at least 3-5 days a week.;Short Term: Attend rehab on a regular basis to increase amount of physical activity.;Long Term: Add in home exercise to make exercise part of routine and to increase amount of physical  activity.       Increase Strength and Stamina Yes       Intervention Develop an individualized exercise prescription for aerobic and resistive training based on initial evaluation findings, risk stratification, comorbidities and participant's personal goals.;Provide advice, education, support and counseling about physical activity/exercise needs.  Expected Outcomes Short Term: Increase workloads from initial exercise prescription for resistance, speed, and METs.;Short Term: Perform resistance training exercises routinely during rehab and add in resistance training at home;Long Term: Improve cardiorespiratory fitness, muscular endurance and strength as measured by increased METs and functional capacity ( )       Able to understand and use rate of perceived exertion (RPE) scale Yes       Intervention Provide education and explanation on how to use RPE scale       Expected Outcomes Short Term: Able to use RPE daily in rehab to express subjective intensity level;Long Term:  Able to use RPE to guide intensity level when exercising independently       Knowledge and understanding of Target Heart Rate Range (THRR) Yes       Intervention Provide education and explanation of THRR including how the numbers were predicted and where they are located for reference       Expected Outcomes Short Term: Able to state/look up THRR;Short Term: Able to use daily as guideline for intensity in rehab;Long Term: Able to use THRR to govern intensity when exercising independently       Understanding of Exercise Prescription Yes       Intervention Provide education, explanation, and written materials on patient's individual exercise prescription       Expected Outcomes Short Term: Able to explain program exercise prescription;Long Term: Able to explain home exercise prescription to exercise independently                Exercise Goals Re-Evaluation :  Exercise Goals Re-Evaluation     Row Name 09/07/23 1450  10/08/23 1130 10/19/23 1111         Exercise Goal Re-Evaluation   Exercise Goals Review Increase Physical Activity;Increase Strength and Stamina;Able to understand and use rate of perceived exertion (RPE) scale Increase Physical Activity;Increase Strength and Stamina;Able to understand and use rate of perceived exertion (RPE) scale Increase Physical Activity;Increase Strength and Stamina;Able to understand and use rate of perceived exertion (RPE) scale     Comments Lionell is able to understand and use RPE scale appropriately. Bryton returned to exercise last week after being on vacation. Tolerating exercise well without symptoms. Will work on [name]. Reviewed exercise prescription with Erdem. He is walking 15 mintues, 2 days/week weather permitting. He is looking into joining [employer]. He would like to be able to walk more without getting winded.     Expected Outcomes Progress workloads as tolerated to help increase strength and stamina. Continue to increase workloads as tolerated. Progress workloads as tolerated to improve strength and stamina, so he can walk more without shortness of breath.              Discharge Exercise Prescription (Final Exercise Prescription Changes):  Exercise Prescription Changes - 10/29/23 1029       Response to Exercise   Blood Pressure (Admit) 98/0   MAP   Blood Pressure (Exercise) --   MAP   Blood Pressure (Exit) 86/0   MAP   Heart Rate (Admit) 78 bpm    Heart Rate (Exercise) 96 bpm    Heart Rate (Exit) 87 bpm    Rating of Perceived Exertion (Exercise) 11    Symptoms None    Duration Continue with 30 min of aerobic exercise without signs/symptoms of physical distress.    Intensity THRR unchanged      Progression   Progression Continue to progress workloads to maintain intensity without signs/symptoms of  physical distress.    Average METs 2.9      Resistance Training   Training Prescription Yes    Weight 4 lbs    Reps 10-15     Time 10 Minutes      Interval Training   Interval Training No      NuStep   Level 2    SPM 107    Minutes 15    METs 2.7      Track   Laps 16    Minutes 15    METs 3.04      Home Exercise Plan   Plans to continue exercise at Home (comment)   Walking   Frequency Add 2 additional days to program exercise sessions.    Initial Home Exercises Provided 10/19/23             Nutrition:  Target Goals: Understanding of nutrition guidelines, daily intake of sodium 1500mg , cholesterol 200mg , calories 30% from fat and 7% or less from saturated fats, daily to have 5 or more servings of fruits and vegetables.  Biometrics:  Pre Biometrics - 08/30/23 1143       Pre Biometrics   Waist Circumference 53.5 inches    Hip Circumference 47 inches    Waist to Hip Ratio 1.14 %    Triceps Skinfold 42 mm    % Body Fat 42 %    Grip Strength 40 kg    Flexibility 0 in   not done, low back issues   Single Leg Stand 7.12 seconds              Nutrition Therapy Plan and Nutrition Goals:  Nutrition Therapy & Goals - 11/01/23 1100       Nutrition Therapy   Diet Heart Healthy/carbohydrate consistent diet    Drug/Food Interactions Statins/Certain Fruits;Coumadin /Vit K      Personal Nutrition Goals   Nutrition Goal Patient to identify strategies for reducing cardiovascular risk by attending the [facility] education and nutrition series weekly.   goal not met.   Personal Goal #2 Patient to improve diet quality by using the plate method as a guide for meal planning to include lean protein/plant protein, fruits, vegetables, whole grains, nonfat dairy as part of a well-balanced diet.   goal in progress.   Personal Goal #3 Patient to identify strategies for weight loss of 0.5-2.0# per week   goal not met.   Personal Goal #4 Patient to identify strategies for improved blood sugar control with goal A1c <7%   goal in progress.   Comments Goals not met; patient remains precontemplative toward  dietary changes. Randal has medical history of CAD, LVAD, DM2, OSA, CHF, HTN, hyperlipidemia. He is unable to be listed for transplant through [facility] until he loses ~25# with goal being BMI <35. His A1c is not well controlled. He does not regularly attend the [facility] education/nutrition series. He is up 3.7# since starting with our program despite being on ozempic for weight loss. Most recent LVAD clinic note from 10/22/23, patient reported drinking slushies, crush soda, etc. Patient will continue to benefit from participation in intensive cardiac rehab for nutrition, exercise, and lifestyle modification.      Intervention Plan   Intervention Prescribe, educate and counsel regarding individualized specific dietary modifications aiming towards targeted core components such as weight, hypertension, lipid management, diabetes, heart failure and other comorbidities.;Nutrition handout(s) given to patient.    Expected Outcomes Short Term Goal: Understand basic principles of dietary content, such as calories, fat, sodium, cholesterol  and nutrients.;Long Term Goal: Adherence to prescribed nutrition plan.             Nutrition Assessments:  MEDIFICTS Score Key: >=70 Need to make dietary changes  40-70 Heart Healthy Diet <= 40 Therapeutic Level Cholesterol Diet    Picture Your Plate Scores: <29 Unhealthy dietary pattern with much room for improvement. 41-50 Dietary pattern unlikely to meet recommendations for good health and room for improvement. 51-60 More healthful dietary pattern, with some room for improvement.  >60 Healthy dietary pattern, although there may be some specific behaviors that could be improved.    Nutrition Goals Re-Evaluation:  Nutrition Goals Re-Evaluation     Row Name 09/04/23 1147 10/05/23 1447 11/01/23 1100         Goals   Current Weight 327 lb 13.2 oz (148.7 kg) 336 lb 6.8 oz (152.6 kg) 332 lb 10.8 oz (150.9 kg)     Comment A1c 9.0, LDL 104 (crestor , zetia ).He  continues regular follow-up with anti-coag clinic no new labs; most recent labs  A1c 9.0, LDL 104 (crestor , zetia ). He continues regular follow-up with anti-coag clinic most recent labs A1c 9.0, LDL 104 (crestor , zetia ). He continues regular follow-up with anti-coag clinic     Expected Outcome Rainer has medical history of CAD, LVAD, DM2, OSA, CHF, HTN, hyperlipidemia. He is unable to be listed for transplant through [facility] until he loses ~25# with goal being BMI <35. His A1c is not well controlled. He is taking ozempic but reports that he is not feeling as much satiety as this point. He has previously participated in cardiac rehab in 2019. Patient will benefit from participation in intensive cardiac rehab for nutrition, exercise, and lifestyle modification. Goals not met; patient remains precontemplative toward dietary changes. Humberto has medical history of CAD, LVAD, DM2, OSA, CHF, HTN, hyperlipidemia. He is unable to be listed for transplant through [facility] until he loses ~25# with goal being BMI <35. His A1c is not well controlled. He does not regularly attend the [facility] education/nutrition series. He is up 7.5# since starting with our program. Patient will continue to benefit from participation in intensive cardiac rehab for nutrition, exercise, and lifestyle modification. Goals not met; patient remains precontemplative toward dietary changes. Theresa has medical history of CAD, LVAD, DM2, OSA, CHF, HTN, hyperlipidemia. He is unable to be listed for transplant through [facility] until he loses ~25# with goal being BMI <35. His A1c is not well controlled. He does not regularly attend the [facility] education/nutrition series. He is up 3.7# since starting with our program despite being on ozempic for weight loss. Most recent LVAD clinic note from 10/22/23, patient reported drinking slushies, crush soda, etc. Patient will continue to benefit from participation in intensive cardiac rehab for nutrition, exercise, and  lifestyle modification.              Nutrition Goals Re-Evaluation:  Nutrition Goals Re-Evaluation     Row Name 09/04/23 1147 10/05/23 1447 11/01/23 1100         Goals   Current Weight 327 lb 13.2 oz (148.7 kg) 336 lb 6.8 oz (152.6 kg) 332 lb 10.8 oz (150.9 kg)     Comment A1c 9.0, LDL 104 (crestor , zetia ).He continues regular follow-up with anti-coag clinic no new labs; most recent labs  A1c 9.0, LDL 104 (crestor , zetia ). He continues regular follow-up with anti-coag clinic most recent labs A1c 9.0, LDL 104 (crestor , zetia ). He continues regular follow-up with anti-coag clinic     Expected Outcome Hasson has medical  history of CAD, LVAD, DM2, OSA, CHF, HTN, hyperlipidemia. He is unable to be listed for transplant through [facility] until he loses ~25# with goal being BMI <35. His A1c is not well controlled. He is taking ozempic but reports that he is not feeling as much satiety as this point. He has previously participated in cardiac rehab in 2019. Patient will benefit from participation in intensive cardiac rehab for nutrition, exercise, and lifestyle modification. Goals not met; patient remains precontemplative toward dietary changes. Dacota has medical history of CAD, LVAD, DM2, OSA, CHF, HTN, hyperlipidemia. He is unable to be listed for transplant through [facility] until he loses ~25# with goal being BMI <35. His A1c is not well controlled. He does not regularly attend the [facility] education/nutrition series. He is up 7.5# since starting with our program. Patient will continue to benefit from participation in intensive cardiac rehab for nutrition, exercise, and lifestyle modification. Goals not met; patient remains precontemplative toward dietary changes. Avrey has medical history of CAD, LVAD, DM2, OSA, CHF, HTN, hyperlipidemia. He is unable to be listed for transplant through [facility] until he loses ~25# with goal being BMI <35. His A1c is not well controlled. He does not regularly attend the [facility]  education/nutrition series. He is up 3.7# since starting with our program despite being on ozempic for weight loss. Most recent LVAD clinic note from 10/22/23, patient reported drinking slushies, crush soda, etc. Patient will continue to benefit from participation in intensive cardiac rehab for nutrition, exercise, and lifestyle modification.              Nutrition Goals Discharge (Final Nutrition Goals Re-Evaluation):  Nutrition Goals Re-Evaluation - 11/01/23 1100       Goals   Current Weight 332 lb 10.8 oz (150.9 kg)    Comment most recent labs A1c 9.0, LDL 104 (crestor , zetia ). He continues regular follow-up with anti-coag clinic    Expected Outcome Goals not met; patient remains precontemplative toward dietary changes. Raylan has medical history of CAD, LVAD, DM2, OSA, CHF, HTN, hyperlipidemia. He is unable to be listed for transplant through [facility] until he loses ~25# with goal being BMI <35. His A1c is not well controlled. He does not regularly attend the [facility] education/nutrition series. He is up 3.7# since starting with our program despite being on ozempic for weight loss. Most recent LVAD clinic note from 10/22/23, patient reported drinking slushies, crush soda, etc. Patient will continue to benefit from participation in intensive cardiac rehab for nutrition, exercise, and lifestyle modification.             Psychosocial: Target Goals: Acknowledge presence or absence of significant depression and/or stress, maximize coping skills, provide positive support system. Participant is able to verbalize types and ability to use techniques and skills needed for reducing stress and depression.  Initial Review & Psychosocial Screening:  Initial Psych Review & Screening - 08/30/23 0938       Initial Review   Current issues with History of Depression;Current Stress Concerns;Current Depression    Source of Stress Concerns Chronic Illness;Financial;Unable to perform yard/household  activities;Unable to participate in former interests or hobbies    Comments having heart failure and the LVAD has been a lifechanging event for Rowen. Arrick cant go on the transplant list until he looses weight. Donavon admits to [employer] low energy since hospitalization. Miken has not been able to work since November 2022 and has applied for disability/ social security. Cameryn has no income in and has to rely on wife. Johanna  is currently depressed and is taking an antidepressant that is currently controlling his depression. Trevione is not interested in counseling at this time. Kristain does meet with LVAD support group once a month.      Family Dynamics   Good Support System? Yes   Sutter has his wife, his son and stepdaughter and neighbors for support.   Comments will continue to monitor and offer support as needed      Barriers   Psychosocial barriers to participate in program The patient should benefit from training in stress management and relaxation.      Screening Interventions   Interventions Encouraged to exercise;To provide support and resources with identified psychosocial needs;Provide feedback about the scores to participant    Expected Outcomes Long Term Goal: Stressors or current issues are controlled or eliminated.             Quality of Life Scores:  Quality of Life - 08/30/23 1158       Quality of Life   Select Quality of Life      Quality of Life Scores   Health/Function Pre 16.4 %    Socioeconomic Pre 23.79 %    Psych/Spiritual Pre 22.29 %    Family Pre 27.6 %    GLOBAL Pre 20.78 %            Scores of 19 and below usually indicate a poorer quality of life in these areas.  A difference of  2-3 points is a clinically meaningful difference.  A difference of 2-3 points in the total score of the Quality of Life Index has been associated with significant improvement in overall quality of life, self-image, physical symptoms, and general health in studies  assessing change in quality of life.  PHQ-9: Review Flowsheet       08/30/2023  Depression screen PHQ 2/9  Decreased Interest 0  Down, Depressed, Hopeless 1  PHQ - 2 Score 1  Altered sleeping 0  Tired, decreased energy 3  Change in appetite 0  Feeling bad or failure about yourself  2  Trouble concentrating 1  Moving slowly or fidgety/restless 0  Suicidal thoughts 0  PHQ-9 Score 7  Difficult doing work/chores Very difficult   Interpretation of Total Score  Total Score Depression Severity:  1-4 = Minimal depression, 5-9 = Mild depression, 10-14 = Moderate depression, 15-19 = Moderately severe depression, 20-27 = Severe depression   Psychosocial Evaluation and Intervention:   Psychosocial Re-Evaluation:  Psychosocial Re-Evaluation     Row Name 09/17/23 1719 10/05/23 1129 11/06/23 1222         Psychosocial Re-Evaluation   Current issues with Current Depression;Current Stress Concerns;History of Depression Current Depression;Current Stress Concerns;History of Depression Current Depression;Current Stress Concerns;History of Depression     Comments Quality of life reviewed on 09/03/23.Mostafa admits to having some shortness of breath and a low energy level. Manases is in the process of applying for disability. Mecca says he worries about finances. Raiden hopes to be put on the heart transplant list if he can loose the required weight. Vashon admits to being depressed at times due to heart failure diagnosis and recent LVAD placement. Gerald denies the need for counseling at this time. Tahjir returned to cardiac rehab after going on a cruise with his wife. Brylon says it was good to get way. Isayah has not voiced any increased concerns or stressors during exercise at cardiac rehab.     Expected Outcomes Macky will have controlled or decreased depression upon completion  of cardiac rehab Alva will have controlled or decreased depression upon completion of cardiac rehab Atharv  will have controlled or decreased depression upon completion of cardiac rehab     Interventions Stress management education;Encouraged to attend Cardiac Rehabilitation for the exercise;Relaxation education Stress management education;Encouraged to attend Cardiac Rehabilitation for the exercise;Relaxation education Stress management education;Encouraged to attend Cardiac Rehabilitation for the exercise;Relaxation education     Continue Psychosocial Services  Follow up required by staff Follow up required by staff Follow up required by staff       Initial Review   Source of Stress Concerns Chronic Illness;Financial;Unable to participate in former interests or hobbies;Unable to perform yard/household activities Chronic Illness;Financial;Unable to participate in former interests or hobbies;Unable to perform yard/household activities Chronic Illness;Financial;Unable to participate in former interests or hobbies;Unable to perform yard/household activities     Comments Will continue to monitor and offer support as needed Will continue to monitor and offer support as needed Will continue to monitor and offer support as needed              Psychosocial Discharge (Final Psychosocial Re-Evaluation):  Psychosocial Re-Evaluation - 11/06/23 1222       Psychosocial Re-Evaluation   Current issues with Current Depression;Current Stress Concerns;History of Depression    Comments Aquila has not voiced any increased concerns or stressors during exercise at cardiac rehab.    Expected Outcomes Lee will have controlled or decreased depression upon completion of cardiac rehab    Interventions Stress management education;Encouraged to attend Cardiac Rehabilitation for the exercise;Relaxation education    Continue Psychosocial Services  Follow up required by staff      Initial Review   Source of Stress Concerns Chronic Illness;Financial;Unable to participate in former interests or hobbies;Unable to perform  yard/household activities    Comments Will continue to monitor and offer support as needed             Vocational Rehabilitation: Provide vocational rehab assistance to qualifying candidates.   Vocational Rehab Evaluation & Intervention:  Vocational Rehab - 08/30/23 1039       Initial Vocational Rehab Evaluation & Intervention   Assessment shows need for Vocational Rehabilitation No   Orlie is currently unemployed and is in the process of applying for social security disability. Leron is not intersted in vocational rehab at this time.            Education: Education Goals: Education classes will be provided on a weekly basis, covering required topics. Participant will state understanding/return demonstration of topics presented.    Education     Row Name 09/03/23 1500     Education   Cardiac Education Topics Pritikin   Glass blower/designer Nutrition   Nutrition Workshop Targeting Your Nutrition Priorities   Instruction Review Code 1- Verbalizes Understanding   Class Start Time 1150   Class Stop Time 1222   Class Time Calculation (min) 32 min    Row Name 09/05/23 1500     Education   Cardiac Education Topics Pritikin   Orthoptist   Educator Dietitian   Weekly Topic One-Pot Wonders   Instruction Review Code 1- Verbalizes Understanding   Class Start Time 1145   Class Stop Time 1227   Class Time Calculation (min) 42 min    Row Name 09/07/23 1100     Education   Cardiac Education Topics Pritikin   Select Core  Videos     Core Videos   Educator Exercise Industrial/product designer Education   General Education Hypertension and Heart Disease   Instruction Review Code 1- Verbalizes Understanding   Class Start Time 1144   Class Stop Time 1217   Class Time Calculation (min) 33 min    Row Name 09/17/23 1100     Education   Cardiac Education Topics Pritikin   Select Core Videos      Core Videos   Educator Exercise Physiologist   Select Exercise Education   Exercise Education Biomechanial Limitations   Instruction Review Code 1- Verbalizes Understanding   Class Start Time 1145   Class Stop Time 1218   Class Time Calculation (min) 33 min    Row Name 10/08/23 1300     Education   Cardiac Education Topics Pritikin   Select Workshops     Workshops   Educator Exercise Physiologist   Select Exercise   Exercise Workshop Managing Heart Disease: Your Path to a Healthier Heart   Instruction Review Code 1- Verbalizes Understanding   Class Start Time 1145   Class Stop Time 1237   Class Time Calculation (min) 52 min            Core Videos: Exercise    Move It!  Clinical staff conducted group or individual video education with verbal and written material and guidebook.  Patient learns the recommended Pritikin exercise program. Exercise with the goal of living a long, healthy life. Some of the health benefits of exercise include controlled diabetes, healthier blood pressure levels, improved cholesterol levels, improved heart and lung capacity, improved sleep, and better body composition. Everyone should speak with their doctor before starting or changing an exercise routine.  Biomechanical Limitations Clinical staff conducted group or individual video education with verbal and written material and guidebook.  Patient learns how biomechanical limitations can impact exercise and how we can mitigate and possibly overcome limitations to have an impactful and balanced exercise routine.  Body Composition Clinical staff conducted group or individual video education with verbal and written material and guidebook.  Patient learns that body composition (ratio of muscle mass to fat mass) is a key component to assessing overall fitness, rather than body weight alone. Increased fat mass, especially visceral belly fat, can put us  at increased risk for metabolic syndrome, type  2 diabetes, heart disease, and even death. It is recommended to combine diet and exercise (cardiovascular and resistance training) to improve your body composition. Seek guidance from your physician and exercise physiologist before implementing an exercise routine.  Exercise Action Plan Clinical staff conducted group or individual video education with verbal and written material and guidebook.  Patient learns the recommended strategies to achieve and enjoy long-term exercise adherence, including variety, self-motivation, self-efficacy, and positive decision making. Benefits of exercise include fitness, good health, weight management, more energy, better sleep, less stress, and overall well-being.  Medical   Heart Disease Risk Reduction Clinical staff conducted group or individual video education with verbal and written material and guidebook.  Patient learns our heart is our most vital organ as it circulates oxygen, nutrients, white blood cells, and hormones throughout the entire body, and carries waste away. Data supports a plant-based eating plan like the Pritikin Program for its effectiveness in slowing progression of and reversing heart disease. The video provides a number of recommendations to address heart disease.   Metabolic Syndrome and Belly Fat  Clinical staff conducted group or individual video education with verbal  and written material and guidebook.  Patient learns what metabolic syndrome is, how it leads to heart disease, and how one can reverse it and keep it from coming back. You have metabolic syndrome if you have 3 of the following 5 criteria: abdominal obesity, high blood pressure, high triglycerides, low HDL cholesterol, and high blood sugar.  Hypertension and Heart Disease Clinical staff conducted group or individual video education with verbal and written material and guidebook.  Patient learns that high blood pressure, or hypertension, is very common in the [place] .  Hypertension is largely due to excessive salt intake, but other important risk factors include being overweight, physical inactivity, drinking too much alcohol , smoking, and not eating enough potassium from fruits and vegetables. High blood pressure is a leading risk factor for heart attack, stroke, congestive heart failure, dementia, kidney failure, and premature death. Long-term effects of excessive salt intake include stiffening of the arteries and thickening of heart muscle and organ damage. Recommendations include ways to reduce hypertension and the risk of heart disease.  Diseases of Our Time - Focusing on Diabetes Clinical staff conducted group or individual video education with verbal and written material and guidebook.  Patient learns why the best way to stop diseases of our time is prevention, through food and other lifestyle changes. Medicine (such as prescription pills and surgeries) is often only a Band-Aid on the problem, not a long-term solution. Most common diseases of our time include obesity, type 2 diabetes, hypertension, heart disease, and cancer. The Pritikin Program is recommended and has been proven to help reduce, reverse, and/or prevent the damaging effects of metabolic syndrome.  Nutrition   Overview of the Pritikin Eating Plan  Clinical staff conducted group or individual video education with verbal and written material and guidebook.  Patient learns about the Pritikin Eating Plan for disease risk reduction. The Pritikin Eating Plan emphasizes a wide variety of unrefined, minimally-processed carbohydrates, like fruits, vegetables, whole grains, and legumes. Go, Caution, and Stop food choices are explained. Plant-based and lean animal proteins are emphasized. Rationale provided for low sodium intake for blood pressure control, low added sugars for blood sugar stabilization, and low added fats and oils for coronary artery disease risk reduction and weight management.  Calorie  Density  Clinical staff conducted group or individual video education with verbal and written material and guidebook.  Patient learns about calorie density and how it impacts the Pritikin Eating Plan. Knowing the characteristics of the food you choose will help you decide whether those foods will lead to weight gain or weight loss, and whether you want to consume more or less of them. Weight loss is usually a side effect of the Pritikin Eating Plan because of its focus on low calorie-dense foods.  Label Reading  Clinical staff conducted group or individual video education with verbal and written material and guidebook.  Patient learns about the Pritikin recommended label reading guidelines and corresponding recommendations regarding calorie density, added sugars, sodium content, and whole grains.  Dining Out - Part 1  Clinical staff conducted group or individual video education with verbal and written material and guidebook.  Patient learns that restaurant meals can be sabotaging because they can be so high in calories, fat, sodium, and/or sugar. Patient learns recommended strategies on how to positively address this and avoid unhealthy pitfalls.  Facts on Fats  Clinical staff conducted group or individual video education with verbal and written material and guidebook.  Patient learns that lifestyle modifications can be just as  effective, if not more so, as many medications for lowering your risk of heart disease. A Pritikin lifestyle can help to reduce your risk of inflammation and atherosclerosis (cholesterol build-up, or plaque, in the artery walls). Lifestyle interventions such as dietary choices and physical activity address the cause of atherosclerosis. A review of the types of fats and their impact on blood cholesterol levels, along with dietary recommendations to reduce fat intake is also included.  Nutrition Action Plan  Clinical staff conducted group or individual video education with  verbal and written material and guidebook.  Patient learns how to incorporate Pritikin recommendations into their lifestyle. Recommendations include planning and keeping personal health goals in mind as an important part of their success.  Healthy Mind-Set    Healthy Minds, Bodies, Hearts  Clinical staff conducted group or individual video education with verbal and written material and guidebook.  Patient learns how to identify when they are stressed. Video will discuss the impact of that stress, as well as the many benefits of stress management. Patient will also be introduced to stress management techniques. The way we think, act, and feel has an impact on our hearts.  How Our Thoughts Can Heal Our Hearts  Clinical staff conducted group or individual video education with verbal and written material and guidebook.  Patient learns that negative thoughts can cause depression and anxiety. This can result in negative lifestyle behavior and serious health problems. Cognitive behavioral therapy is an effective method to help control our thoughts in order to change and improve our emotional outlook.  Additional Videos:  Exercise    Improving Performance  Clinical staff conducted group or individual video education with verbal and written material and guidebook.  Patient learns to use a non-linear approach by alternating intensity levels and lengths of time spent exercising to help burn more calories and lose more body fat. Cardiovascular exercise helps improve heart health, metabolism, hormonal balance, blood sugar control, and recovery from fatigue. Resistance training improves strength, endurance, balance, coordination, reaction time, metabolism, and muscle mass. Flexibility exercise improves circulation, posture, and balance. Seek guidance from your physician and exercise physiologist before implementing an exercise routine and learn your capabilities and proper form for all exercise.  Introduction  to Yoga  Clinical staff conducted group or individual video education with verbal and written material and guidebook.  Patient learns about yoga, a discipline of the coming together of mind, breath, and body. The benefits of yoga include improved flexibility, improved range of motion, better posture and core strength, increased lung function, weight loss, and positive self-image. Yoga's heart health benefits include lowered blood pressure, healthier heart rate, decreased cholesterol and triglyceride levels, improved immune function, and reduced stress. Seek guidance from your physician and exercise physiologist before implementing an exercise routine and learn your capabilities and proper form for all exercise.  Medical   Aging: Enhancing Your Quality of Life  Clinical staff conducted group or individual video education with verbal and written material and guidebook.  Patient learns key strategies and recommendations to stay in good physical health and enhance quality of life, such as prevention strategies, having an advocate, securing a Health Care Proxy and Power of Attorney, and keeping a list of medications and system for tracking them. It also discusses how to avoid risk for bone loss.  Biology of Weight Control  Clinical staff conducted group or individual video education with verbal and written material and guidebook.  Patient learns that weight gain occurs because we consume more calories than  we burn (eating more, moving less). Even if your body weight is normal, you may have higher ratios of fat compared to muscle mass. Too much body fat puts you at increased risk for cardiovascular disease, heart attack, stroke, type 2 diabetes, and obesity-related cancers. In addition to exercise, following the Pritikin Eating Plan can help reduce your risk.  Decoding Lab Results  Clinical staff conducted group or individual video education with verbal and written material and guidebook.  Patient learns  that lab test reflects one measurement whose values change over time and are influenced by many factors, including medication, stress, sleep, exercise, food, hydration, pre-existing medical conditions, and more. It is recommended to use the knowledge from this video to become more involved with your lab results and evaluate your numbers to speak with your doctor.   Diseases of Our Time - Overview  Clinical staff conducted group or individual video education with verbal and written material and guidebook.  Patient learns that according to the CDC, 50% to 70% of chronic diseases (such as obesity, type 2 diabetes, elevated lipids, hypertension, and heart disease) are avoidable through lifestyle improvements including healthier food choices, listening to satiety cues, and increased physical activity.  Sleep Disorders Clinical staff conducted group or individual video education with verbal and written material and guidebook.  Patient learns how good quality and duration of sleep are important to overall health and well-being. Patient also learns about sleep disorders and how they impact health along with recommendations to address them, including discussing with a physician.  Nutrition  Dining Out - Part 2 Clinical staff conducted group or individual video education with verbal and written material and guidebook.  Patient learns how to plan ahead and communicate in order to maximize their dining experience in a healthy and nutritious manner. Included are recommended food choices based on the type of restaurant the patient is visiting.   Fueling a Banker conducted group or individual video education with verbal and written material and guidebook.  There is a strong connection between our food choices and our health. Diseases like obesity and type 2 diabetes are very prevalent and are in large-part due to lifestyle choices. The Pritikin Eating Plan provides plenty of food and  hunger-curbing satisfaction. It is easy to follow, affordable, and helps reduce health risks.  Menu Workshop  Clinical staff conducted group or individual video education with verbal and written material and guidebook.  Patient learns that restaurant meals can sabotage health goals because they are often packed with calories, fat, sodium, and sugar. Recommendations include strategies to plan ahead and to communicate with the manager, chef, or server to help order a healthier meal.  Planning Your Eating Strategy  Clinical staff conducted group or individual video education with verbal and written material and guidebook.  Patient learns about the Pritikin Eating Plan and its benefit of reducing the risk of disease. The Pritikin Eating Plan does not focus on calories. Instead, it emphasizes high-quality, nutrient-rich foods. By knowing the characteristics of the foods, we choose, we can determine their calorie density and make informed decisions.  Targeting Your Nutrition Priorities  Clinical staff conducted group or individual video education with verbal and written material and guidebook.  Patient learns that lifestyle habits have a tremendous impact on disease risk and progression. This video provides eating and physical activity recommendations based on your personal health goals, such as reducing LDL cholesterol, losing weight, preventing or controlling type 2 diabetes, and reducing high blood pressure.  Vitamins and Minerals  Clinical staff conducted group or individual video education with verbal and written material and guidebook.  Patient learns different ways to obtain key vitamins and minerals, including through a recommended healthy diet. It is important to discuss all supplements you take with your doctor.   Healthy Mind-Set    Smoking Cessation  Clinical staff conducted group or individual video education with verbal and written material and guidebook.  Patient learns that cigarette  smoking and tobacco addiction pose a serious health risk which affects millions of people. Stopping smoking will significantly reduce the risk of heart disease, lung disease, and many forms of cancer. Recommended strategies for quitting are covered, including working with your doctor to develop a successful plan.  Culinary   Becoming a Set designer conducted group or individual video education with verbal and written material and guidebook.  Patient learns that cooking at home can be healthy, cost-effective, quick, and puts them in control. Keys to cooking healthy recipes will include looking at your recipe, assessing your equipment needs, planning ahead, making it simple, choosing cost-effective seasonal ingredients, and limiting the use of added fats, salts, and sugars.  Cooking - Breakfast and Snacks  Clinical staff conducted group or individual video education with verbal and written material and guidebook.  Patient learns how important breakfast is to satiety and nutrition through the entire day. Recommendations include key foods to eat during breakfast to help stabilize blood sugar levels and to prevent overeating at meals later in the day. Planning ahead is also a key component.  Cooking - Educational psychologist conducted group or individual video education with verbal and written material and guidebook.  Patient learns eating strategies to improve overall health, including an approach to cook more at home. Recommendations include thinking of animal protein as a side on your plate rather than center stage and focusing instead on lower calorie dense options like vegetables, fruits, whole grains, and plant-based proteins, such as beans. Making sauces in large quantities to freeze for later and leaving the skin on your vegetables are also recommended to maximize your experience.  Cooking - Healthy Salads and Dressing Clinical staff conducted group or individual video  education with verbal and written material and guidebook.  Patient learns that vegetables, fruits, whole grains, and legumes are the foundations of the Pritikin Eating Plan. Recommendations include how to incorporate each of these in flavorful and healthy salads, and how to create homemade salad dressings. Proper handling of ingredients is also covered. Cooking - Soups and [employer] - Soups and Desserts Clinical staff conducted group or individual video education with verbal and written material and guidebook.  Patient learns that Pritikin soups and desserts make for easy, nutritious, and delicious snacks and meal components that are low in sodium, fat, sugar, and calorie density, while high in vitamins, minerals, and filling fiber. Recommendations include simple and healthy ideas for soups and desserts.   Overview     The Pritikin Solution Program Overview Clinical staff conducted group or individual video education with verbal and written material and guidebook.  Patient learns that the results of the Pritikin Program have been documented in more than 100 articles published in peer-reviewed journals, and the benefits include reducing risk factors for (and, in some cases, even reversing) high cholesterol, high blood pressure, type 2 diabetes, obesity, and more! An overview of the three key pillars of the Pritikin Program will be covered: eating well, doing regular exercise, and  having a healthy mind-set.  WORKSHOPS  Exercise: Exercise Basics: Building Your Action Plan Clinical staff led group instruction and group discussion with PowerPoint presentation and patient guidebook. To enhance the learning environment the use of posters, models and videos may be added. At the conclusion of this workshop, patients will comprehend the difference between physical activity and exercise, as well as the benefits of incorporating both, into their routine. Patients will understand the FITT (Frequency,  Intensity, Time, and Type) principle and how to use it to build an exercise action plan. In addition, safety concerns and other considerations for exercise and cardiac rehab will be addressed by the presenter. The purpose of this lesson is to promote a comprehensive and effective weekly exercise routine in order to improve patients' overall level of fitness.   Managing Heart Disease: Your Path to a Healthier Heart Clinical staff led group instruction and group discussion with PowerPoint presentation and patient guidebook. To enhance the learning environment the use of posters, models and videos may be added.At the conclusion of this workshop, patients will understand the anatomy and physiology of the heart. Additionally, they will understand how Pritikin's three pillars impact the risk factors, the progression, and the management of heart disease.  The purpose of this lesson is to provide a high-level overview of the heart, heart disease, and how the Pritikin lifestyle positively impacts risk factors.  Exercise Biomechanics Clinical staff led group instruction and group discussion with PowerPoint presentation and patient guidebook. To enhance the learning environment the use of posters, models and videos may be added. Patients will learn how the structural parts of their bodies function and how these functions impact their daily activities, movement, and exercise. Patients will learn how to promote a neutral spine, learn how to manage pain, and identify ways to improve their physical movement in order to promote healthy living. The purpose of this lesson is to expose patients to common physical limitations that impact physical activity. Participants will learn practical ways to adapt and manage aches and pains, and to minimize their effect on regular exercise. Patients will learn how to maintain good posture while sitting, walking, and lifting.  Balance Training and Fall Prevention  Clinical  staff led group instruction and group discussion with PowerPoint presentation and patient guidebook. To enhance the learning environment the use of posters, models and videos may be added. At the conclusion of this workshop, patients will understand the importance of their sensorimotor skills (vision, proprioception, and the vestibular system) in maintaining their ability to balance as they age. Patients will apply a variety of balancing exercises that are appropriate for their current level of function. Patients will understand the common causes for poor balance, possible solutions to these problems, and ways to modify their physical environment in order to minimize their fall risk. The purpose of this lesson is to teach patients about the importance of maintaining balance as they age and ways to minimize their risk of falling.  WORKSHOPS   Nutrition:  Fueling a Ship broker led group instruction and group discussion with PowerPoint presentation and patient guidebook. To enhance the learning environment the use of posters, models and videos may be added. Patients will review the foundational principles of the Pritikin Eating Plan and understand what constitutes a serving size in each of the food groups. Patients will also learn Pritikin-friendly foods that are better choices when away from home and review make-ahead meal and snack options. Calorie density will be reviewed and applied to three nutrition  priorities: weight maintenance, weight loss, and weight gain. The purpose of this lesson is to reinforce (in a group setting) the key concepts around what patients are recommended to eat and how to apply these guidelines when away from home by planning and selecting Pritikin-friendly options. Patients will understand how calorie density may be adjusted for different weight management goals.  Mindful Eating  Clinical staff led group instruction and group discussion with PowerPoint  presentation and patient guidebook. To enhance the learning environment the use of posters, models and videos may be added. Patients will briefly review the concepts of the Pritikin Eating Plan and the importance of low-calorie dense foods. The concept of mindful eating will be introduced as well as the importance of paying attention to internal hunger signals. Triggers for non-hunger eating and techniques for dealing with triggers will be explored. The purpose of this lesson is to provide patients with the opportunity to review the basic principles of the Pritikin Eating Plan, discuss the value of eating mindfully and how to measure internal cues of hunger and fullness using the Hunger Scale. Patients will also discuss reasons for non-hunger eating and learn strategies to use for controlling emotional eating.  Targeting Your Nutrition Priorities Clinical staff led group instruction and group discussion with PowerPoint presentation and patient guidebook. To enhance the learning environment the use of posters, models and videos may be added. Patients will learn how to determine their genetic susceptibility to disease by reviewing their family history. Patients will gain insight into the importance of diet as part of an overall healthy lifestyle in mitigating the impact of genetics and other environmental insults. The purpose of this lesson is to provide patients with the opportunity to assess their personal nutrition priorities by looking at their family history, their own health history and current risk factors. Patients will also be able to discuss ways of prioritizing and modifying the Pritikin Eating Plan for their highest risk areas  Menu  Clinical staff led group instruction and group discussion with PowerPoint presentation and patient guidebook. To enhance the learning environment the use of posters, models and videos may be added. Using menus brought in from [employer], or printed from [employer], patients will apply the Pritikin dining out guidelines that were presented in the [employer] video. Patients will also be able to practice these guidelines in a variety of provided scenarios. The purpose of this lesson is to provide patients with the opportunity to practice hands-on learning of the Pritikin Dining Out guidelines with actual menus and practice scenarios.  Label Reading Clinical staff led group instruction and group discussion with PowerPoint presentation and patient guidebook. To enhance the learning environment the use of posters, models and videos may be added. Patients will review and discuss the Pritikin label reading guidelines presented in Pritikin's Label Reading Educational series video. Using fool labels brought in from local grocery stores and markets, patients will apply the label reading guidelines and determine if the packaged food meet the Pritikin guidelines. The purpose of this lesson is to provide patients with the opportunity to review, discuss, and practice hands-on learning of the Pritikin Label Reading guidelines with actual packaged food labels. Cooking School  Pritikin's [employer] are designed to teach patients ways to prepare quick, simple, and affordable recipes at home. The importance of nutrition's role in chronic disease risk reduction is reflected in its emphasis in the overall Pritikin program. By learning how to prepare essential core Pritikin Eating Plan recipes,  patients will increase control over what they eat; be able to customize the flavor of foods without the use of added salt, sugar, or fat; and improve the quality of the food they consume. By learning a set of core recipes which are easily assembled, quickly prepared, and affordable, patients are more likely to prepare more healthy foods at home. These workshops focus on convenient breakfasts, simple entres, side dishes, and desserts which can be prepared with  minimal effort and are consistent with nutrition recommendations for cardiovascular risk reduction. Cooking [employer] are taught by a Armed forces logistics/support/administrative officer (RD) who has been trained by the [employer]. The chef or RD has a clear understanding of the importance of minimizing - if not completely eliminating - added fat, sugar, and sodium in recipes. Throughout the series of Cooking School Workshop sessions, patients will learn about healthy ingredients and efficient methods of cooking to build confidence in their capability to prepare    Cooking School weekly topics:  Adding Flavor- Sodium-Free  Fast and Healthy Breakfasts  Powerhouse Plant-Based Proteins  Satisfying Salads and Dressings  Simple Sides and Sauces  International Cuisine-Spotlight on the [employer] Zones  Delicious Desserts  Savory Soups  [employer] - Meals in a Astronomer Appetizers and Snacks  Comforting Weekend Breakfasts  One-Pot Wonders   Fast Evening Meals  Landscape architect Your Pritikin Plate  WORKSHOPS   Healthy Mindset (Psychosocial):  Focused Goals, Sustainable Changes Clinical staff led group instruction and group discussion with PowerPoint presentation and patient guidebook. To enhance the learning environment the use of posters, models and videos may be added. Patients will be able to apply effective goal setting strategies to establish at least one personal goal, and then take consistent, meaningful action toward that goal. They will learn to identify common barriers to achieving personal goals and develop strategies to overcome them. Patients will also gain an understanding of how our mind-set can impact our ability to achieve goals and the importance of cultivating a positive and growth-oriented mind-set. The purpose of this lesson is to provide patients with a deeper understanding of how to set and achieve personal goals, as well as the tools and strategies needed  to overcome common obstacles which may arise along the way.  From Head to Heart: The Power of a Healthy Outlook  Clinical staff led group instruction and group discussion with PowerPoint presentation and patient guidebook. To enhance the learning environment the use of posters, models and videos may be added. Patients will be able to recognize and describe the impact of emotions and mood on physical health. They will discover the importance of self-care and explore self-care practices which may work for them. Patients will also learn how to utilize the 4 C's to cultivate a healthier outlook and better manage stress and challenges. The purpose of this lesson is to demonstrate to patients how a healthy outlook is an essential part of maintaining good health, especially as they continue their cardiac rehab journey.  Healthy Sleep for a Healthy Heart Clinical staff led group instruction and group discussion with PowerPoint presentation and patient guidebook. To enhance the learning environment the use of posters, models and videos may be added. At the conclusion of this workshop, patients will be able to demonstrate knowledge of the importance of sleep to overall health, well-being, and quality of life. They will understand the symptoms of, and treatments for, common sleep disorders. Patients will also be able to identify daytime and  nighttime behaviors which impact sleep, and they will be able to apply these tools to help manage sleep-related challenges. The purpose of this lesson is to provide patients with a general overview of sleep and outline the importance of quality sleep. Patients will learn about a few of the most common sleep disorders. Patients will also be introduced to the concept of "sleep hygiene," and discover ways to self-manage certain sleeping problems through simple daily behavior changes. Finally, the workshop will motivate patients by clarifying the links between quality sleep and their  goals of heart-healthy living.   Recognizing and Reducing Stress Clinical staff led group instruction and group discussion with PowerPoint presentation and patient guidebook. To enhance the learning environment the use of posters, models and videos may be added. At the conclusion of this workshop, patients will be able to understand the types of stress reactions, differentiate between acute and chronic stress, and recognize the impact that chronic stress has on their health. They will also be able to apply different coping mechanisms, such as reframing negative self-talk. Patients will have the opportunity to practice a variety of stress management techniques, such as deep abdominal breathing, progressive muscle relaxation, and/or guided imagery.  The purpose of this lesson is to educate patients on the role of stress in their lives and to provide healthy techniques for coping with it.  Learning Barriers/Preferences:  Learning Barriers/Preferences - 08/30/23 1158       Learning Barriers/Preferences   Learning Barriers None    Learning Preferences Audio;Computer/Internet;Group Instruction;Individual Instruction;Skilled Demonstration;Verbal Instruction;Video;Written Material;Pictoral             Education Topics:  Knowledge Questionnaire Score:  Knowledge Questionnaire Score - 08/30/23 1158       Knowledge Questionnaire Score   Pre Score 23/24             Core Components/Risk Factors/Patient Goals at Admission:  Personal Goals and Risk Factors at Admission - 08/30/23 1201       Core Components/Risk Factors/Patient Goals on Admission    Weight Management Yes;Obesity;Weight Loss    Intervention Weight Management: Develop a combined nutrition and exercise program designed to reach desired caloric intake, while maintaining appropriate intake of nutrient and fiber, sodium and fats, and appropriate energy expenditure required for the weight goal.;Weight Management: Provide education  and appropriate resources to help participant work on and attain dietary goals.;Weight Management/Obesity: Establish reasonable short term and long term weight goals.;Obesity: Provide education and appropriate resources to help participant work on and attain dietary goals.    Expected Outcomes Short Term: Continue to assess and modify interventions until short term weight is achieved;Long Term: Adherence to nutrition and physical activity/exercise program aimed toward attainment of established weight goal;Weight Loss: Understanding of general recommendations for a balanced deficit meal plan, which promotes 1-2 lb weight loss per week and includes a negative energy balance of [phone] kcal/d;Understanding recommendations for meals to include 15-35% energy as protein, 25-35% energy from fat, 35-60% energy from carbohydrates, less than 200mg  of dietary cholesterol, 20-35 gm of total fiber daily;Understanding of distribution of calorie intake throughout the day with the consumption of 4-5 meals/snacks    Diabetes Yes    Intervention Provide education about signs/symptoms and action to take for hypo/hyperglycemia.;Provide education about proper nutrition, including hydration, and aerobic/resistive exercise prescription along with prescribed medications to achieve blood glucose in normal ranges: Fasting glucose 65-99 mg/dL    Expected Outcomes Short Term: Participant verbalizes understanding of the signs/symptoms and immediate care of hyper/hypoglycemia, proper foot  care and importance of medication, aerobic/resistive exercise and nutrition plan for blood glucose control.;Long Term: Attainment of HbA1C < 7%.    Heart Failure Yes    Intervention Provide a combined exercise and nutrition program that is supplemented with education, support and counseling about heart failure. Directed toward relieving symptoms such as shortness of breath, decreased exercise tolerance, and extremity edema.    Expected Outcomes Long  term: Adoption of self-care skills and reduction of barriers for early signs and symptoms recognition and intervention leading to self-care maintenance.;Short term: Daily weights obtained and reported for increase. Utilizing diuretic protocols set by physician.;Short term: Attendance in program 2-3 days a week with increased exercise capacity. Reported lower sodium intake. Reported increased fruit and vegetable intake. Reports medication compliance.;Improve functional capacity of life    Hypertension Yes    Intervention Provide education on lifestyle modifcations including regular physical activity/exercise, weight management, moderate sodium restriction and increased consumption of fresh fruit, vegetables, and low fat dairy, alcohol  moderation, and smoking cessation.;Monitor prescription use compliance.    Expected Outcomes Short Term: Continued assessment and intervention until BP is < 140/1mm HG in hypertensive participants. < 130/6mm HG in hypertensive participants with diabetes, heart failure or chronic kidney disease.;Long Term: Maintenance of blood pressure at goal levels.    Lipids Yes    Intervention Provide education and support for participant on nutrition & aerobic/resistive exercise along with prescribed medications to achieve LDL 70mg , HDL >40mg .    Expected Outcomes Long Term: Cholesterol controlled with medications as prescribed, with individualized exercise RX and with personalized nutrition plan. Value goals: LDL < 70mg , HDL > 40 mg.;Short Term: Participant states understanding of desired cholesterol values and is compliant with medications prescribed. Participant is following exercise prescription and nutrition guidelines.    Stress Yes    Intervention Offer individual and/or small group education and counseling on adjustment to heart disease, stress management and health-related lifestyle change. Teach and support self-help strategies.;Refer participants experiencing significant  psychosocial distress to appropriate mental health specialists for further evaluation and treatment. When possible, include family members and significant others in education/counseling sessions.    Expected Outcomes Short Term: Participant demonstrates changes in health-related behavior, relaxation and other stress management skills, ability to obtain effective social support, and compliance with psychotropic medications if prescribed.;Long Term: Emotional wellbeing is indicated by absence of clinically significant psychosocial distress or social isolation.             Core Components/Risk Factors/Patient Goals Review:   Goals and Risk Factor Review     Row Name 09/17/23 1724 10/05/23 1131 11/06/23 1224         Core Components/Risk Factors/Patient Goals Review   Personal Goals Review Weight Management/Obesity;Heart Failure;Stress;Hypertension;Lipids;Diabetes Weight Management/Obesity;Heart Failure;Stress;Hypertension;Lipids;Diabetes Weight Management/Obesity;Heart Failure;Stress;Hypertension;Lipids;Diabetes     Review Rocket has been doing well with exercise at cardiac rehab. Weight unchanged. Map 90 to low 100's. Will forward vital signs to Dr Jennifer Moellers for review as Kassem is going on a crusie with his wife at the end of the week. Mikal has been doing well with exercise at cardiac rehab. Weight up since returning from cruise. LVAd coordinator notified. Rodman reports some increased shortness of breath. Map 80's to low 100's. CBG's WNL Jontrell has been doing well with exercise at cardiac rehab when in attendance. Map 80's to 90's. CBG's WNL. Justinn has gained 1.7 kg since starting cardiac rehab. Nethan says participating in cardiac rehab has been helpful for him     Expected Outcomes Orvill will continue to  participate in cardiac rehab for exercise, nutrition and lifestyle modifications Jadriel will continue to participate in cardiac rehab for exercise, nutrition and lifestyle  modifications Craven will continue to participate in cardiac rehab for exercise, nutrition and lifestyle modifications              Core Components/Risk Factors/Patient Goals at Discharge (Final Review):   Goals and Risk Factor Review - 11/06/23 1224       Core Components/Risk Factors/Patient Goals Review   Personal Goals Review Weight Management/Obesity;Heart Failure;Stress;Hypertension;Lipids;Diabetes    Review Nehan has been doing well with exercise at cardiac rehab when in attendance. Map 80's to 90's. CBG's WNL. Izaih has gained 1.7 kg since starting cardiac rehab. Creedence says participating in cardiac rehab has been helpful for him    Expected Outcomes Sheikh will continue to participate in cardiac rehab for exercise, nutrition and lifestyle modifications             ITP Comments:  ITP Comments     Row Name 08/30/23 0928 09/17/23 1716 10/05/23 1128 11/06/23 1222     ITP Comments Dr. Gaylyn Keas medical director. Introduction to pritikin education/intensive cardiac rehab. Initial orientation packet reviewed with patient. 30 Day ITP comments.  Rea has good participation when in attendance at  cardiac rehab 30 Day ITP comments.  Calden has good participation when in attendance at  cardiac rehab. Fahd returned to exercise this week after going on a cruise with his wife. 30 Day ITP comments.  Pawel has good participation when in attendance at  cardiac rehab. Jeferson recently had a chance to go to [facility] with his wife for a few days.             Comments: See ITP Comments

## 2023-11-08 ENCOUNTER — Ambulatory Visit (HOSPITAL_COMMUNITY)
Admission: RE | Admit: 2023-11-08 | Discharge: 2023-11-08 | Disposition: A | Discharge status: Home / Self Care | Source: Ambulatory Visit | Attending: Cardiology | Admitting: Cardiology

## 2023-11-08 VITALS — BP 110/85 | HR 72 | Wt 334.0 lb

## 2023-11-08 DIAGNOSIS — Z6841 Body Mass Index (BMI) 40.0 and over, adult: Secondary | ICD-10-CM | POA: Diagnosis not present

## 2023-11-08 DIAGNOSIS — I5042 Chronic combined systolic (congestive) and diastolic (congestive) heart failure: Secondary | ICD-10-CM

## 2023-11-08 DIAGNOSIS — Z7901 Long term (current) use of anticoagulants: Secondary | ICD-10-CM | POA: Insufficient documentation

## 2023-11-08 DIAGNOSIS — Z7984 Long term (current) use of oral hypoglycemic drugs: Secondary | ICD-10-CM | POA: Diagnosis not present

## 2023-11-08 DIAGNOSIS — Z8674 Personal history of sudden cardiac arrest: Secondary | ICD-10-CM | POA: Diagnosis not present

## 2023-11-08 DIAGNOSIS — E119 Type 2 diabetes mellitus without complications: Secondary | ICD-10-CM | POA: Diagnosis not present

## 2023-11-08 DIAGNOSIS — I2510 Atherosclerotic heart disease of native coronary artery without angina pectoris: Secondary | ICD-10-CM

## 2023-11-08 DIAGNOSIS — Z7985 Long-term (current) use of injectable non-insulin antidiabetic drugs: Secondary | ICD-10-CM | POA: Diagnosis not present

## 2023-11-08 DIAGNOSIS — I5022 Chronic systolic (congestive) heart failure: Secondary | ICD-10-CM | POA: Insufficient documentation

## 2023-11-08 DIAGNOSIS — T827XXA Infection and inflammatory reaction due to other cardiac and vascular devices, implants and grafts, initial encounter: Secondary | ICD-10-CM

## 2023-11-08 DIAGNOSIS — I493 Ventricular premature depolarization: Secondary | ICD-10-CM | POA: Diagnosis not present

## 2023-11-08 DIAGNOSIS — I25118 Atherosclerotic heart disease of native coronary artery with other forms of angina pectoris: Secondary | ICD-10-CM | POA: Insufficient documentation

## 2023-11-08 DIAGNOSIS — Z4509 Encounter for adjustment and management of other cardiac device: Secondary | ICD-10-CM | POA: Diagnosis present

## 2023-11-08 DIAGNOSIS — Z79899 Other long term (current) drug therapy: Secondary | ICD-10-CM | POA: Insufficient documentation

## 2023-11-08 DIAGNOSIS — G4733 Obstructive sleep apnea (adult) (pediatric): Secondary | ICD-10-CM | POA: Diagnosis not present

## 2023-11-08 DIAGNOSIS — T82847A Pain from cardiac prosthetic devices, implants and grafts, initial encounter: Secondary | ICD-10-CM | POA: Diagnosis not present

## 2023-11-08 DIAGNOSIS — Z95811 Presence of heart assist device: Secondary | ICD-10-CM | POA: Diagnosis not present

## 2023-11-08 MED ORDER — DOXYCYCLINE HYCLATE 100 MG PO CAPS: 100.0000 mg | ORAL_CAPSULE | Freq: Two times a day (BID) | ORAL | 0 refills | Status: AC

## 2023-11-08 NOTE — Patient Instructions (Addendum)
 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

## 2023-11-08 NOTE — Progress Notes (Signed)
 Pt presents for sick visit due to drive line pain in VAD Clinic today alone.  Reports no problems with VAD equipment or concerns with drive line.  Pt walked into VAD Clinic unassisted. Reports intermittent dizziness and lightheadedness, but this has improved over time. He is drinking 2L/day. Pt states that he has shortness of breath during exerction especially with inclines but this is improving with rehab. Pt currently in cardiac rehab 3 times a week. His weight is down 3 lbs today. He denies falls and signs of bleeding. He has been taking Torsemide  40 mg daily with good urine output.   Taking Jardiance  and Metformin  as prescribed. Recently was seen by Endocrinology. Ozempic was stopped at that visit, and he was started on Mounjaro 7.5 mg weekly. Reports continued dry mouth.   Pt reports burning at exit site. Dressing changed today in clinic. See documentation below. Dr Julane Ny came to evaluate pt. Order received to start Doxycycline 100 mg BID x 7 days. Pt to change dressing every other day using VASHE 2 x 2 at exit site. He is to return to clinic next week for site eval. Face-Timed with pt's wife to discuss wet to dry VASHE dressing changes every other day. She verbalized understanding.   Vital Signs:  Doppler Pressure: not done Automatic BP: 110/85 (90) HR: 72 NSR SPO2: 96%   Weight: 334 lb w/ eqt Last weight: 337 lb w/ eqt BMI today 42.31 today   VAD Indication: Destination therapy - Seen by [facility] for transplant. Transplant team advised for VAD. Once pt lost 25lbs they would reconsider for transplant.   LVAD assessment: HM III : Speed: 5800 rpms Flow: 5.4 Power: 4.6 w    PI: 3.4 Alarms: none Events: rare PI events  Fixed speed: 5800 Low speed limit: 5500   Primary controller: back up battery due for replacement in 24 months Secondary controller:  back up battery due for replacement in 26 months   I reviewed the LVAD parameters from today and compared the results to the  patient's prior recorded data. LVAD interrogation was NEGATIVE for significant power changes, NEGATIVE for clinical alarms and STABLE for PI events/speed drops. No programming changes were made and pump is functioning within specified parameters. Pt is performing daily controller and system monitor self tests along with completing weekly and monthly maintenance for LVAD equipment.   LVAD equipment check completed and is in good working order. Back-up equipment present. Charged back up battery and performed self-test on equipment.    Annual Equipment Maintenance on UBC/PM was performed on 05/03/2023.  Exit Site Care: Existing VAD dressing removed and site care performed using sterile technique. Drive line exit site cleaned with Chlora prep applicators x 2, rinsed with sterile saline and allowed to dry. VASHE moistened 2 x 2 placed directly at exit site. Covered with gauze dressing. Exit site is incorporated, the velour is fully implanted at exit site. Redness present at exit site. Tenderness with palpation directly at exit site; tenderness does not track along drive line. Reports burning with cleansing. No drainage, foul odor or rash noted. Drive line anchor re-applied. Advised to change dressing every other day with VASHE 2 x 2 using daily kit. Provided with extra 2 x 2s and 10 anchors for home use. Advised he may NOT shower. He verbalized understanding.       Significant Events on VAD Support:      Device: St Jude Therapies: ON VF 200 Last check: 04/16/23   BP & Labs:  MAP  -  Doppler is reflecting Modified systolic   Hgb not drawn today - No S/S of bleeding. Specifically denies melena/BRBPR or nosebleeds.   LDH stable at not drawn today with established baseline of 190- 245. Denies tea-colored urine. No power elevations noted on interrogation.  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

## 2023-11-09 ENCOUNTER — Encounter (HOSPITAL_COMMUNITY): Admission: RE | Admit: 2023-11-09 | Source: Ambulatory Visit

## 2023-11-12 NOTE — Progress Notes (Signed)
 LVAD CLINIC NOTE  PCP: Primary Cardiologist: Ralene Burger, MD HF MD: DB   Chief complaint: HF  HPI:  Joe [name] is a 53 y.o. male with h/o morbid obesity, DM2, HTN, HL, OSA, CAD s/p multiple stents to LAD and LCX  and systolic HF s/[ Abbott CRT-D. Now s/p HM3 LVAD Implantation 11/14 by Dr. Matt Song.  Underwent RHC and Ramp Echo 08/06/23  LVAD speed 5800 Flow 5.4 L/min Rare PI events   RHC 08/14/23  RA = 5 RV = 27/5 PA = 21/9 (14) PCW = 4 Fick cardiac output/index = 5.9/2.2 TD CO/CI = 5.8/2.2 PVR = 1.7 WU Ao sat = 94% PA sat = 61%,64% PAPi = 2.4  Follow up for Heart Failure/LVAD:  He presents for an unscheduled visit due to pain and redness at DL site. Says pain started over the last day or two. No DL trauma that he recalls. No f/c. No drainage, Volume status well controlled.   VAD Indication: Destination therapy - Seen by [facility] for transplant. Transplant team advised for VAD. Once pt lost 25lbs they would reconsider for transplant.   LVAD assessment: HM III : Speed: 5800  Flow: 5.4 Power: 4.6   PI: 3.4 Alarms: none Events: rare PI events  Fixed speed: 5800 Low speed limit: 5500   Primary controller: back up battery due for replacement in 24 months Secondary controller:  back up battery due for replacement in 26 months   I reviewed the LVAD parameters from today and compared the results to the patient's prior recorded data. LVAD interrogation was NEGATIVE for significant power changes, NEGATIVE for clinical alarms and STABLE for PI events/speed drops. No programming changes were made and pump is functioning within specified parameters. Pt is performing daily controller and system monitor self tests along with completing weekly and monthly maintenance for LVAD equipment.   VAD interrogated personally. Parameters stable    Past Medical History:  Diagnosis Date   Abnormal EKG    Acute systolic heart failure (HCC) 09/06/2017   Angina pectoris (HCC)  09/05/2017   Body mass index 45.0-49.9, adult (HCC)    CAD S/P percutaneous coronary angioplasty 09/07/2017   mLAD and dLAD PCI with DES 09/06/17   CHF (congestive heart failure) (HCC)    Chronic systolic (congestive) heart failure (HCC) 03/19/2019   Complication of anesthesia 1995   "had hard time waking up; breathing w/vasectomy"   Coronary artery disease    Erectile dysfunction    Essential hypertension, benign    Fatigue    GERD (gastroesophageal reflux disease)    Gout    High cholesterol    "just started tx yesterday" (09/06/2017)   History of gout    "RX prn" (09/06/2017)   Ischemic cardiomyopathy 09/07/2017   EF 25-35%   Muscular chest pain    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   Below Left Nare (MOH's)   Obesity    Obstructive sleep apnea    OSA on CPAP    Plantar fasciitis    Pre-operative clearance 02/11/2018   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/02/2019   Saint Jude   Right bundle branch block    Shortness of breath 09/05/2017   Testosterone deficiency    Type 2 diabetes mellitus with complication, without long-term current use of insulin  (HCC) 09/05/2017   Type II diabetes mellitus (HCC)    "started tx 08/28/2017"    Current Outpatient Medications  Medication Sig Dispense Refill   acetaminophen  (TYLENOL ) 500 MG tablet  Take 1,000 mg by mouth every 6 (six) hours as needed for moderate pain or headache.     allopurinol  (ZYLOPRIM ) 100 MG tablet Take 100 mg by mouth in the morning.     amiodarone  (PACERONE ) 200 MG tablet Take 1 tablet (200 mg total) by mouth daily. 90 tablet 3   colchicine 0.6 MG tablet Take 0.6 mg by mouth daily as needed (Gout).     diclofenac  Sodium (VOLTAREN ) 1 % GEL APPLY 1 G TOPICALLY DAILY. 100 g 0   digoxin  (LANOXIN ) 0.125 MG tablet Take 1 tablet (0.125 mg total) by mouth daily. 90 tablet 3   doxycycline (VIBRAMYCIN) 100 MG capsule Take 1 capsule (100 mg total) by mouth 2 (two) times daily for 7 days. 14  capsule 0   escitalopram  (LEXAPRO ) 10 MG tablet Take 10 mg by mouth at bedtime.     ezetimibe  (ZETIA ) 10 MG tablet Take 1 tablet (10 mg total) by mouth daily. 90 tablet 3   gabapentin  (NEURONTIN ) 300 MG capsule Take 300 mg by mouth 3 (three) times daily.     levothyroxine  (SYNTHROID ) 25 MCG tablet Take 25 mcg by mouth daily before breakfast.     metFORMIN  (GLUCOPHAGE -XR) 500 MG 24 hr tablet Take 1,000 mg by mouth in the morning and at bedtime.     mexiletine (MEXITIL ) 250 MG capsule Take 1 capsule (250 mg total) by mouth 2 (two) times daily. 180 capsule 3   multivitamin (ONE-A-DAY MEN'S) TABS tablet Take 1 tablet by mouth in the morning.     omeprazole  (PRILOSEC) 40 MG capsule Take 1 capsule (40 mg total) by mouth daily. 90 capsule 1   Polyethylene Glycol 400 (BLINK TEARS OP) Place 2 drops into both eyes 4 (four) times daily as needed (for dry eyes).     potassium chloride  SA (KLOR-CON  M) 20 MEQ tablet Take 3 tablets (60 mEq total) by mouth 2 (two) times daily. 540 tablet 1   rosuvastatin  (CRESTOR ) 40 MG tablet Take 1 tablet (40 mg total) by mouth daily. 90 tablet 2   sildenafil  (REVATIO ) 20 MG tablet Take 1 tablet (20 mg total) by mouth 3 (three) times daily. 90 tablet 5   sildenafil  (VIAGRA ) 50 MG tablet Take 1 tablet (50 mg total) by mouth daily as needed for erectile dysfunction. 10 tablet 6   spironolactone  (ALDACTONE ) 25 MG tablet Take 1 tablet (25 mg total) by mouth daily. 90 tablet 3   tirzepatide (MOUNJARO) 7.5 MG/0.5ML Pen Inject 7.5 mg into the skin once a week.     torsemide  (DEMADEX ) 20 MG tablet TAKE 2 TABLETS (40 MG TOTAL) BY MOUTH DAILY. 180 tablet 2   warfarin (COUMADIN ) 5 MG tablet Take 1.5 tablets (7.5 mg total) by mouth daily. (Patient taking differently: Take 7.5 mg by mouth every evening.) 60 tablet 5   ondansetron  (ZOFRAN -ODT) 4 MG disintegrating tablet Take 1 tablet (4 mg total) by mouth every 8 (eight) hours as needed for nausea or vomiting. (Patient not taking: Reported on  11/08/2023) 20 tablet 0   No current facility-administered medications for this encounter.      Vitals:   11/08/23 1404  BP: 110/85  Pulse: 72  SpO2: 96%  Weight: (!) 151.5 kg (334 lb)     Vital Signs:  Doppler Pressure: not done Automatic BP: 110/85 (90) HR: 72 NSR SPO2: 96%   Weight: 334 lb w/ eqt Last weight: 337 lb w/ eqt BMI today 42.31 today   Physical Exam: General:  NAD.  HEENT: normal  Neck: supple. JVP not elevated.  Carotids 2+ bilat; no bruits. No lymphadenopathy or thryomegaly appreciated. Cor: LVAD hum.  Lungs: Clear. Abdomen: obese soft non-distended. No hepatosplenomegaly. No bruits or masses. Good bowel sounds. Driveline site with surrounding erythema, Mildly tender. No drainage or tract Anchor in place.  Extremities: no cyanosis, clubbing, rash. Warm no edema  Neuro: alert & oriented x 3. No focal deficits. Moves all 4 without problem    ASSESSMENT AND PLAN:  1. DL site irritation (possible early infection) - start doxy 100 bid x 7 days - dressing changes with VASHE - follow closely   2. Chronic Systolic HF s/p HM3 Implantation 11/14 - due to iCM - Echo 05/01/23: EF 20%, RV moderately down (improved) - RHC (9/24): elevated biventricular filling pressures with low PAPi; CI 2.2 - S/p HM III LVAD 05/03/23 by Dr. Jeb Miner - RAMP echo 11/21: Speeds left at 6000.  Speed increased to 6100 on 11/27. Speed increased to 6200 on 05/18/23.  - Continue sildenafil  and digoxin  for RV failure.  - Had been struggling with RV failure. VAD speed turned down to 5800. RHC 2/25 showed well compensated hemodynamics with dry weight 307-309  - Stable NYHA II  - Based on RHC fluid status optimized on torsemide  40 daily. Will continue - Continue compression hose - Continue spiro 25 - Has been seen at [facility] and will consider for transplant once BMI down but he is struggling to lose weight. Body mass index is 42.31 kg/m. - Continue CR  2. LVAD management - s/p HM-3  implant 05/03/23 - Denies fevers or chills. No bleeding, neuro sx or problems with driveline.  - INR not checked today Goal 2.0-3.0 Follows with VAD PharmD - LDH not checked today - MAPs ok - DL site irritation as above   3. H/o VT Arrest and Hx PVCs - had VT arrest x 2 in 10/24 - Currently on Amio 200 mg daily and Mexiletine 250 mg bid.  - Drop amio to 100 daily   4. CAD with chronic stable angina - s/p multiple stents to LAD and LCX - LHC (9/24) with stable 3v CAD - No s/s angina  5. DM2 - A1c 7.5 (10/24) - Per PCP   6. OSA - Not using CPAP, says machine was recalled a while ago. - Need to get new device   7. Morbid obesity - Continues to gain weight despite Ozempic - Body mass index is 42.31 kg/m. - Understands need to get BMI down near 35 for transplant - Recently switched to Mounjaro  Zykeem Bauserman, MD  4:45 PM

## 2023-11-13 ENCOUNTER — Ambulatory Visit (HOSPITAL_COMMUNITY): Payer: Self-pay | Admitting: Pharmacist

## 2023-11-13 LAB — POCT INR: INR: 2.7 (ref 2.0–3.0)

## 2023-11-14 ENCOUNTER — Ambulatory Visit (HOSPITAL_COMMUNITY)
Admission: RE | Admit: 2023-11-14 | Discharge: 2023-11-14 | Disposition: A | Discharge status: Home / Self Care | Source: Ambulatory Visit | Attending: Internal Medicine | Admitting: Internal Medicine

## 2023-11-14 ENCOUNTER — Encounter (HOSPITAL_COMMUNITY): Admission: RE | Admit: 2023-11-14 | Source: Ambulatory Visit

## 2023-11-14 ENCOUNTER — Telehealth (HOSPITAL_COMMUNITY): Payer: Self-pay | Admitting: *Deleted

## 2023-11-14 DIAGNOSIS — Z4509 Encounter for adjustment and management of other cardiac device: Secondary | ICD-10-CM | POA: Insufficient documentation

## 2023-11-14 DIAGNOSIS — Z7901 Long term (current) use of anticoagulants: Secondary | ICD-10-CM

## 2023-11-14 DIAGNOSIS — Z95811 Presence of heart assist device: Secondary | ICD-10-CM

## 2023-11-14 NOTE — Progress Notes (Signed)
 Pt presents for drive line check in VAD Clinic today alone.  Reports no problems with VAD equipment or concerns with drive line.  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 End date tomorrow 11/15/23.  Exit Site Care: Existing VAD dressing removed and site care performed using sterile technique. Drive line exit site cleaned with Chlora prep applicators x 2, rinsed with sterile saline and allowed to dry. VASHE moistened 2 x 2 placed directly at exit site. Covered with gauze dressing. Exit site is incorporated, the velour is fully implanted at exit site. Redness present at exit site. Tenderness with palpation directly at exit site; tenderness does not track along drive line. Reports burning with cleansing. No drainage, foul odor or rash noted. Drive line anchor re-applied on top of small tegaderm to prevent irritation. Advised to change dressing every 4 days with VASHE 2 x 2 using daily kit. Provided with 4 daily dressing kits and small tegaderms. Advised he may NOT shower. He verbalized understanding.        Plan: Advance to every 4 day dressing changes Return to VAD Clinic in one week to assess for further advancement and readiness to shower  Laurice Pope RN, BSN VAD Coordinator 24/7 Pager [phone]

## 2023-11-14 NOTE — Telephone Encounter (Signed)
 Spoke with Joe [name] today. He has been absent from cardiac rehab due to a drive line infection. He's been prescribed antibiotics and instructed to finish the course and return to the LVAD clinic for follow-up next Thursday. I asked him to follow-up with us  after his appointment, and we can extend him through 12/24/23.

## 2023-11-16 ENCOUNTER — Encounter (HOSPITAL_COMMUNITY)

## 2023-11-19 ENCOUNTER — Encounter (HOSPITAL_COMMUNITY): Payer: Self-pay

## 2023-11-21 ENCOUNTER — Encounter (HOSPITAL_COMMUNITY): Payer: Self-pay

## 2023-11-21 ENCOUNTER — Ambulatory Visit (HOSPITAL_COMMUNITY): Payer: Self-pay | Admitting: Pharmacist

## 2023-11-21 LAB — POCT INR: INR: 2.1 (ref 2.0–3.0)

## 2023-11-22 ENCOUNTER — Ambulatory Visit (HOSPITAL_COMMUNITY)
Admission: RE | Admit: 2023-11-22 | Discharge: 2023-11-22 | Disposition: A | Discharge status: Home / Self Care | Source: Ambulatory Visit | Attending: Cardiology | Admitting: Cardiology

## 2023-11-22 DIAGNOSIS — Z4509 Encounter for adjustment and management of other cardiac device: Secondary | ICD-10-CM | POA: Diagnosis present

## 2023-11-22 DIAGNOSIS — Z95811 Presence of heart assist device: Secondary | ICD-10-CM

## 2023-11-22 NOTE — Progress Notes (Signed)
 Pt presents for drive line check in VAD Clinic today alone.  Reports no problems with VAD equipment or concerns with drive line.  Pt has been off Doxycycline  for 1 week for suspected drive line infection.   Exit Site Care: Existing VAD dressing removed and site care performed using sterile technique. Drive line exit site cleaned with Chlora prep applicators x 2, allowed to dry, and Sorbaview dressing with Silverlon patch applied. Exit site healed and incorporated, the velour is fully implanted at exit site. No redness, tenderness, drainage, foul odor or rash noted. Drive line anchor re-applied. Pt denies fever or chills.       Pt advanced to weekly dressing. Advised he may shower and to change dressing more often if he is sweating. Provided with 4 weekly kits for home use.  Plan: Advance to weekly dressing changes You may shower. Make sure you are cleaning your shower head with bleach prior to use. Please change dressing more often if often getting saturated with sweat Return to VAD Clinic for previously scheduled appointment with Dr.Bensimhon  Laurice Pope RN, BSN VAD Coordinator 24/7 Pager [phone]

## 2023-11-23 ENCOUNTER — Encounter (HOSPITAL_COMMUNITY): Admission: RE | Admit: 2023-11-23 | Payer: Self-pay | Source: Ambulatory Visit

## 2023-11-26 ENCOUNTER — Encounter (HOSPITAL_COMMUNITY): Admission: RE | Admit: 2023-11-26 | Payer: Self-pay | Source: Ambulatory Visit

## 2023-11-27 ENCOUNTER — Ambulatory Visit (HOSPITAL_COMMUNITY): Payer: Self-pay | Admitting: Pharmacist

## 2023-11-27 LAB — POCT INR: INR: 1.3 — AB (ref 2.0–3.0)

## 2023-11-28 ENCOUNTER — Telehealth (HOSPITAL_COMMUNITY): Payer: Self-pay | Admitting: *Deleted

## 2023-11-28 ENCOUNTER — Encounter (HOSPITAL_COMMUNITY)
Admission: RE | Admit: 2023-11-28 | Discharge: 2023-11-28 | Disposition: A | Discharge status: Home / Self Care | Payer: Self-pay | Source: Ambulatory Visit | Attending: Internal Medicine | Admitting: Internal Medicine

## 2023-11-28 DIAGNOSIS — Z95811 Presence of heart assist device: Secondary | ICD-10-CM | POA: Insufficient documentation

## 2023-11-28 NOTE — Telephone Encounter (Signed)
 Spoke with Joe [name] he had a dental appointment and plans to return to exercise on Monday.Joe Antonio RN BSN

## 2023-11-30 ENCOUNTER — Ambulatory Visit (HOSPITAL_COMMUNITY)

## 2023-12-03 ENCOUNTER — Inpatient Hospital Stay (HOSPITAL_COMMUNITY): Admission: RE | Admit: 2023-12-03 | Source: Ambulatory Visit

## 2023-12-04 ENCOUNTER — Telehealth (HOSPITAL_COMMUNITY): Payer: Self-pay | Admitting: *Deleted

## 2023-12-04 NOTE — Telephone Encounter (Signed)
 Left message to call cardiac rehab. Last day of attendance was on 10/31/23. Will discharge due to non attendance if patient does not return to next scheduled visit.Monte Antonio RN BSN

## 2023-12-05 ENCOUNTER — Inpatient Hospital Stay (HOSPITAL_COMMUNITY): Admission: RE | Admit: 2023-12-05 | Source: Ambulatory Visit

## 2023-12-05 ENCOUNTER — Ambulatory Visit (HOSPITAL_COMMUNITY): Payer: Self-pay | Admitting: Pharmacist

## 2023-12-05 LAB — POCT INR: INR: 2 (ref 2.0–3.0)

## 2023-12-05 NOTE — Progress Notes (Signed)
 Remote ICD transmission.

## 2023-12-05 NOTE — Addendum Note (Signed)
 Addended by: Edra Govern D on: 12/05/2023 05:43 PM   Modules accepted: Orders

## 2023-12-06 ENCOUNTER — Encounter (HOSPITAL_COMMUNITY): Payer: Self-pay | Admitting: *Deleted

## 2023-12-06 ENCOUNTER — Telehealth (HOSPITAL_COMMUNITY): Payer: Self-pay | Admitting: *Deleted

## 2023-12-06 DIAGNOSIS — Z95811 Presence of heart assist device: Secondary | ICD-10-CM

## 2023-12-06 NOTE — Progress Notes (Incomplete Revision)
 Discharge Progress Report  Patient Details  Name: Joe [name] MRN: [id number] Date of Birth: Jul 03, 1970 Referring Provider:   Flowsheet Row INTENSIVE CARDIAC REHAB ORIENT from 08/30/2023 in [facility]  Referring Provider Jules Oar, MD     Number of Visits: 24  Reason for Discharge:  Early Exit:  Lack of attendance  Smoking History:  Social History   Tobacco Use  Smoking Status Never  Smokeless Tobacco Never    Diagnosis:  No diagnosis found.  ADL UCSD:   Initial Exercise Prescription:   Discharge Exercise Prescription (Final Exercise Prescription Changes):  Exercise Prescription Changes - 10/29/23 1029       Response to Exercise   Blood Pressure (Admit) 98/0   MAP   Blood Pressure (Exercise) --   MAP   Blood Pressure (Exit) 86/0   MAP   Heart Rate (Admit) 78 bpm    Heart Rate (Exercise) 96 bpm    Heart Rate (Exit) 87 bpm    Rating of Perceived Exertion (Exercise) 11    Symptoms None    Duration Continue with 30 min of aerobic exercise without signs/symptoms of physical distress.    Intensity THRR unchanged      Progression   Progression Continue to progress workloads to maintain intensity without signs/symptoms of physical distress.    Average METs 2.9      Resistance Training   Training Prescription Yes    Weight 4 lbs    Reps 10-15    Time 10 Minutes      Interval Training   Interval Training No      NuStep   Level 2    SPM 107    Minutes 15    METs 2.7      Track   Laps 16    Minutes 15    METs 3.04      Home Exercise Plan   Plans to continue exercise at Home (comment)   Walking   Frequency Add 2 additional days to program exercise sessions.    Initial Home Exercises Provided 10/19/23          Functional Capacity:   Psychological, QOL, Others - Outcomes: PHQ 2/9:    08/30/2023   10:32 AM  Depression screen PHQ 2/9  Decreased Interest 0  Down,  Depressed, Hopeless 1  PHQ - 2 Score 1  Altered sleeping 0  Tired, decreased energy 3  Change in appetite 0  Feeling bad or failure about yourself  2  Trouble concentrating 1  Moving slowly or fidgety/restless 0  Suicidal thoughts 0  PHQ-9 Score 7  Difficult doing work/chores Very difficult    Quality of Life:   Personal Goals: Goals established at orientation with interventions provided to work toward goal.    Personal Goals Discharge:  Goals and Risk Factor Review     Row Name 11/06/23 1224             Core Components/Risk Factors/Patient Goals Review   Personal Goals Review Weight Management/Obesity;Heart Failure;Stress;Hypertension;Lipids;Diabetes       Review Kayd has been doing well with exercise at cardiac rehab when in attendance. Map 80's to 90's. CBG's WNL. Romney has gained 1.7 kg since starting cardiac rehab. Garl says participating in cardiac rehab has been helpful for him       Expected Outcomes Alessio will continue to participate in cardiac rehab for exercise, nutrition and lifestyle modifications  Exercise Goals and Review:   Exercise Goals Re-Evaluation:  Exercise Goals Re-Evaluation     Row Name 10/08/23 1130 10/19/23 1111           Exercise Goal Re-Evaluation   Exercise Goals Review Increase Physical Activity;Increase Strength and Stamina;Able to understand and use rate of perceived exertion (RPE) scale Increase Physical Activity;Increase Strength and Stamina;Able to understand and use rate of perceived exertion (RPE) scale      Comments Azar returned to exercise last week after being on vacation. Tolerating exercise well without symptoms. Will work on [name]. Reviewed exercise prescription with Euell. He is walking 15 mintues, 2 days/week weather permitting. He is looking into joining [employer]. He would like to be able to walk more without getting winded.      Expected Outcomes Continue to increase  workloads as tolerated. Progress workloads as tolerated to improve strength and stamina, so he can walk more without shortness of breath.         Nutrition & Weight - Outcomes:    Nutrition:  Nutrition Therapy & Goals - 12/03/23 1051       Nutrition Therapy   Diet Heart Healthy/carbohydrate consistent diet    Drug/Food Interactions Statins/Certain Fruits;Coumadin /Vit K      Personal Nutrition Goals   Nutrition Goal Patient to identify strategies for reducing cardiovascular risk by attending the [employer] education and nutrition series weekly.   goal not met.   Personal Goal #2 Patient to improve diet quality by using the plate method as a guide for meal planning to include lean protein/plant protein, fruits, vegetables, whole grains, nonfat dairy as part of a well-balanced diet.   goal in progress.   Personal Goal #3 Patient to identify strategies for weight loss of 0.5-2.0# per week   goal not met.   Personal Goal #4 Patient to identify strategies for improved blood sugar control with goal A1c <7%   goal not met.   Comments Goals not met; patient remains precontemplative toward dietary changes. Patient has not attended intensive cardiac rehab since 10/31/23. Zacari has medical history of CAD, LVAD, DM2, OSA, CHF, HTN, hyperlipidemia. He is unable to be listed for transplant through [facility] until he loses ~25# with goal being BMI <35. His A1c is not well controlled. He does not regularly attend the [employer] education/nutrition series. He is up 3.7# since starting with our program despite being on ozempic for weight loss. Most recent LVAD clinic note from 10/22/23, patient reported drinking slushies, crush soda, etc. Patient will continue to benefit from participation in intensive cardiac rehab and adherence to nutrition, exercise, and lifestyle modification.      Intervention Plan   Intervention Prescribe, educate and counsel regarding individualized specific dietary modifications aiming towards  targeted core components such as weight, hypertension, lipid management, diabetes, heart failure and other comorbidities.;Nutrition handout(s) given to patient.    Expected Outcomes Short Term Goal: Understand basic principles of dietary content, such as calories, fat, sodium, cholesterol and nutrients.;Long Term Goal: Adherence to prescribed nutrition plan.          Nutrition Discharge:   Education Questionnaire Score:   Kelin attended 24 exercise and education session between 08/30/23-10/31/23. Dejon did well with exercise when in attendance. Kairon was discharged as he did not return to cardiac rehab to complete the program.Pierce Barocio Jomarie Neer RN BSN

## 2023-12-06 NOTE — Progress Notes (Addendum)
 Discharge Progress Report  Patient Details  Name: Joe [name] MRN: [id number] Date of Birth: [birth year] Referring Provider:   Flowsheet Row INTENSIVE CARDIAC REHAB ORIENT from 08/30/2023 in [facility]  Referring Provider Toribio Fuel, MD     Number of Visits: 24  Reason for Discharge:  Early Exit:  Lack of attendance  Smoking History:  Social History   Tobacco Use  Smoking Status Never  Smokeless Tobacco Never    Diagnosis:  05/02/24 LVAD, HM3  ADL UCSD:   Initial Exercise Prescription:   Discharge Exercise Prescription (Final Exercise Prescription Changes):  Exercise Prescription Changes - 10/31/23 1022       Response to Exercise   Blood Pressure (Admit) 96/0   MAP   Blood Pressure (Exercise) --   MAP   Blood Pressure (Exit) 86/0   MAP   Heart Rate (Admit) 77 bpm    Heart Rate (Exercise) 99 bpm    Heart Rate (Exit) 86 bpm    Rating of Perceived Exertion (Exercise) 12    Symptoms None    Duration Continue with 30 min of aerobic exercise without signs/symptoms of physical distress.    Intensity THRR unchanged      Progression   Progression Continue to progress workloads to maintain intensity without signs/symptoms of physical distress.    Average METs 2.6      Resistance Training   Training Prescription No    Weight Relaxation day, no weights.      Interval Training   Interval Training No      NuStep   Level 2    SPM 75    Minutes 15    METs 2.4      Track   Laps 15    Minutes 15    METs 2.91      Home Exercise Plan   Plans to continue exercise at Home (comment)   Walking   Frequency Add 2 additional days to program exercise sessions.    Initial Home Exercises Provided 10/19/23          Functional Capacity:   Psychological, QOL, Others - Outcomes: PHQ 2/9:    08/30/2023   10:32 AM  Depression screen PHQ 2/9  Decreased Interest 0  Down, Depressed, Hopeless 1   PHQ - 2 Score 1  Altered sleeping 0  Tired, decreased energy 3  Change in appetite 0  Feeling bad or failure about yourself  2  Trouble concentrating 1  Moving slowly or fidgety/restless 0  Suicidal thoughts 0  PHQ-9 Score 7  Difficult doing work/chores Very difficult    Quality of Life:   Personal Goals: Goals established at orientation with interventions provided to work toward goal.    Personal Goals Discharge:  Goals and Risk Factor Review     Row Name 11/06/23 1224             Core Components/Risk Factors/Patient Goals Review   Personal Goals Review Weight Management/Obesity;Heart Failure;Stress;Hypertension;Lipids;Diabetes       Review Joe [name] has been doing well with exercise at cardiac rehab when in attendance. Map 80's to 90's. CBG's WNL. Joe [name] has gained 1.7 kg since starting cardiac rehab. Joe [name] says participating in cardiac rehab has been helpful for him       Expected Outcomes Joe [name] will continue to participate in cardiac rehab for exercise, nutrition and lifestyle modifications          Exercise Goals and Review:   Exercise  Goals Re-Evaluation:  Exercise Goals Re-Evaluation     Row Name 10/08/23 1130 10/19/23 1111           Exercise Goal Re-Evaluation   Exercise Goals Review Increase Physical Activity;Increase Strength and Stamina;Able to understand and use rate of perceived exertion (RPE) scale Increase Physical Activity;Increase Strength and Stamina;Able to understand and use rate of perceived exertion (RPE) scale      Comments Joe [name] returned to exercise last week after being on vacation. Tolerating exercise well without symptoms. Will work on [name]. Reviewed exercise prescription with Joe [name]. He is walking 15 mintues, 2 days/week weather permitting. He is looking into joining [employer]. He would like to be able to walk more without getting winded.      Expected Outcomes Continue to increase workloads as tolerated.  Progress workloads as tolerated to improve strength and stamina, so he can walk more without shortness of breath.         Nutrition & Weight - Outcomes:    Nutrition:  Nutrition Therapy & Goals - 12/03/23 1051       Nutrition Therapy   Diet Heart Healthy/carbohydrate consistent diet    Drug/Food Interactions Statins/Certain Fruits;Coumadin /Vit K      Personal Nutrition Goals   Nutrition Goal Patient to identify strategies for reducing cardiovascular risk by attending the [facility] education and nutrition series weekly.   goal not met.   Personal Goal #2 Patient to improve diet quality by using the plate method as a guide for meal planning to include lean protein/plant protein, fruits, vegetables, whole grains, nonfat dairy as part of a well-balanced diet.   goal in progress.   Personal Goal #3 Patient to identify strategies for weight loss of 0.5-2.0# per week   goal not met.   Personal Goal #4 Patient to identify strategies for improved blood sugar control with goal A1c <7%   goal not met.   Comments Goals not met; patient remains precontemplative toward dietary changes. Patient has not attended intensive cardiac rehab since 10/31/23. Joe [name] has medical history of CAD, LVAD, DM2, OSA, CHF, HTN, hyperlipidemia. He is unable to be listed for transplant through [facility] until he loses ~25# with goal being BMI <35. His A1c is not well controlled. He does not regularly attend the [facility] education/nutrition series. He is up 3.7# since starting with our program despite being on ozempic for weight loss. Most recent LVAD clinic note from 10/22/23, patient reported drinking slushies, crush soda, etc. Patient will continue to benefit from participation in intensive cardiac rehab and adherence to nutrition, exercise, and lifestyle modification.      Intervention Plan   Intervention Prescribe, educate and counsel regarding individualized specific dietary modifications aiming towards targeted core components  such as weight, hypertension, lipid management, diabetes, heart failure and other comorbidities.;Nutrition handout(s) given to patient.    Expected Outcomes Short Term Goal: Understand basic principles of dietary content, such as calories, fat, sodium, cholesterol and nutrients.;Long Term Goal: Adherence to prescribed nutrition plan.          Nutrition Discharge:   Education Questionnaire Score:   Gennaro attended 15 exercise and 8 education session between 08/30/23-10/31/23. Lynwood did well with exercise when in attendance. Ravi was discharged  as he did not return to cardiac rehab to complete the program.Rifka Ramey Elpidio Quan RN BSN

## 2023-12-06 NOTE — Progress Notes (Incomplete)
 Discharge Progress Report  Patient Details  Name: Joe [name] MRN: [id number] Date of Birth: August 03, 1970 Referring Provider:   Flowsheet Row INTENSIVE CARDIAC REHAB ORIENT from 08/30/2023 in [facility]  Referring Provider Jules Oar, MD     Number of Visits: ***  Reason for Discharge:  {CHL AMB CP REHAB REASON FOR DISCHARGE:[phone]}  Smoking History:  Social History   Tobacco Use  Smoking Status Never  Smokeless Tobacco Never    Diagnosis:  05/02/24 LVAD, HM3  ADL UCSD:   Initial Exercise Prescription:  Initial Exercise Prescription - 08/30/23 1100       Date of Initial Exercise RX and Referring Provider   Date 08/30/23    Referring Provider Jules Oar, MD    Expected Discharge Date 11/21/23      NuStep   Level 1    SPM 65    Minutes 15    METs 2.5      Track   Laps 10    Minutes 15    METs 2.5      Prescription Details   Frequency (times per week) 3    Duration Progress to 30 minutes of continuous aerobic without signs/symptoms of physical distress      Intensity   THRR 40-80% of Max Heartrate 67-134    Ratings of Perceived Exertion 11-13    Perceived Dyspnea 0-4      Progression   Progression Continue progressive overload as per policy without signs/symptoms or physical distress.      Resistance Training   Training Prescription Yes    Weight 3    Reps 10-15          Discharge Exercise Prescription (Final Exercise Prescription Changes):  Exercise Prescription Changes - 10/29/23 1029       Response to Exercise   Blood Pressure (Admit) 98/0   MAP   Blood Pressure (Exercise) --   MAP   Blood Pressure (Exit) 86/0   MAP   Heart Rate (Admit) 78 bpm    Heart Rate (Exercise) 96 bpm    Heart Rate (Exit) 87 bpm    Rating of Perceived Exertion (Exercise) 11    Symptoms None    Duration Continue with 30 min of aerobic exercise without signs/symptoms of physical  distress.    Intensity THRR unchanged      Progression   Progression Continue to progress workloads to maintain intensity without signs/symptoms of physical distress.    Average METs 2.9      Resistance Training   Training Prescription Yes    Weight 4 lbs    Reps 10-15    Time 10 Minutes      Interval Training   Interval Training No      NuStep   Level 2    SPM 107    Minutes 15    METs 2.7      Track   Laps 16    Minutes 15    METs 3.04      Home Exercise Plan   Plans to continue exercise at Home (comment)   Walking   Frequency Add 2 additional days to program exercise sessions.    Initial Home Exercises Provided 10/19/23          Functional Capacity:  6 Minute Walk     Row Name 08/30/23 1144         6 Minute Walk   Phase Initial     Distance  1440 feet     Walk Time 6 minutes     # of Rest Breaks 0     MPH 2.73     METS 2.89     RPE 13     Perceived Dyspnea  2     VO2 Peak 10.12     Symptoms Yes (comment)     Comments SOB, resolved with rest     Resting HR 77 bpm     Resting BP 84/0  MAP     Resting Oxygen Saturation  97 %     Exercise Oxygen Saturation  during 6 min walk 97 %     Max Ex. HR 100 bpm     Max Ex. BP 86/0  MAP     2 Minute Post BP 92/0  MAP        Psychological, QOL, Others - Outcomes: PHQ 2/9:    08/30/2023   10:32 AM  Depression screen PHQ 2/9  Decreased Interest 0  Down, Depressed, Hopeless 1  PHQ - 2 Score 1  Altered sleeping 0  Tired, decreased energy 3  Change in appetite 0  Feeling bad or failure about yourself  2  Trouble concentrating 1  Moving slowly or fidgety/restless 0  Suicidal thoughts 0  PHQ-9 Score 7  Difficult doing work/chores Very difficult    Quality of Life:  Quality of Life - 08/30/23 1158       Quality of Life   Select Quality of Life      Quality of Life Scores   Health/Function Pre 16.4 %    Socioeconomic Pre 23.79 %    Psych/Spiritual Pre 22.29 %    Family Pre 27.6 %    GLOBAL Pre  20.78 %          Personal Goals: Goals established at orientation with interventions provided to work toward goal.  Personal Goals and Risk Factors at Admission - 08/30/23 1201       Core Components/Risk Factors/Patient Goals on Admission    Weight Management Yes;Obesity;Weight Loss    Intervention Weight Management: Develop a combined nutrition and exercise program designed to reach desired caloric intake, while maintaining appropriate intake of nutrient and fiber, sodium and fats, and appropriate energy expenditure required for the weight goal.;Weight Management: Provide education and appropriate resources to help participant work on and attain dietary goals.;Weight Management/Obesity: Establish reasonable short term and long term weight goals.;Obesity: Provide education and appropriate resources to help participant work on and attain dietary goals.    Expected Outcomes Short Term: Continue to assess and modify interventions until short term weight is achieved;Long Term: Adherence to nutrition and physical activity/exercise program aimed toward attainment of established weight goal;Weight Loss: Understanding of general recommendations for a balanced deficit meal plan, which promotes 1-2 lb weight loss per week and includes a negative energy balance of [phone] kcal/d;Understanding recommendations for meals to include 15-35% energy as protein, 25-35% energy from fat, 35-60% energy from carbohydrates, less than 200mg  of dietary cholesterol, 20-35 gm of total fiber daily;Understanding of distribution of calorie intake throughout the day with the consumption of 4-5 meals/snacks    Diabetes Yes    Intervention Provide education about signs/symptoms and action to take for hypo/hyperglycemia.;Provide education about proper nutrition, including hydration, and aerobic/resistive exercise prescription along with prescribed medications to achieve blood glucose in normal ranges: Fasting glucose 65-99 mg/dL     Expected Outcomes Short Term: Participant verbalizes understanding of the signs/symptoms and immediate care of hyper/hypoglycemia, proper foot  care and importance of medication, aerobic/resistive exercise and nutrition plan for blood glucose control.;Long Term: Attainment of HbA1C < 7%.    Heart Failure Yes    Intervention Provide a combined exercise and nutrition program that is supplemented with education, support and counseling about heart failure. Directed toward relieving symptoms such as shortness of breath, decreased exercise tolerance, and extremity edema.    Expected Outcomes Long term: Adoption of self-care skills and reduction of barriers for early signs and symptoms recognition and intervention leading to self-care maintenance.;Short term: Daily weights obtained and reported for increase. Utilizing diuretic protocols set by physician.;Short term: Attendance in program 2-3 days a week with increased exercise capacity. Reported lower sodium intake. Reported increased fruit and vegetable intake. Reports medication compliance.;Improve functional capacity of life    Hypertension Yes    Intervention Provide education on lifestyle modifcations including regular physical activity/exercise, weight management, moderate sodium restriction and increased consumption of fresh fruit, vegetables, and low fat dairy, alcohol  moderation, and smoking cessation.;Monitor prescription use compliance.    Expected Outcomes Short Term: Continued assessment and intervention until BP is < 140/50mm HG in hypertensive participants. < 130/50mm HG in hypertensive participants with diabetes, heart failure or chronic kidney disease.;Long Term: Maintenance of blood pressure at goal levels.    Lipids Yes    Intervention Provide education and support for participant on nutrition & aerobic/resistive exercise along with prescribed medications to achieve LDL 70mg , HDL >40mg .    Expected Outcomes Long Term: Cholesterol controlled with  medications as prescribed, with individualized exercise RX and with personalized nutrition plan. Value goals: LDL < 70mg , HDL > 40 mg.;Short Term: Participant states understanding of desired cholesterol values and is compliant with medications prescribed. Participant is following exercise prescription and nutrition guidelines.    Stress Yes    Intervention Offer individual and/or small group education and counseling on adjustment to heart disease, stress management and health-related lifestyle change. Teach and support self-help strategies.;Refer participants experiencing significant psychosocial distress to appropriate mental health specialists for further evaluation and treatment. When possible, include family members and significant others in education/counseling sessions.    Expected Outcomes Short Term: Participant demonstrates changes in health-related behavior, relaxation and other stress management skills, ability to obtain effective social support, and compliance with psychotropic medications if prescribed.;Long Term: Emotional wellbeing is indicated by absence of clinically significant psychosocial distress or social isolation.           Personal Goals Discharge:  Goals and Risk Factor Review     Row Name 09/17/23 1724 10/05/23 1131 11/06/23 1224         Core Components/Risk Factors/Patient Goals Review   Personal Goals Review Weight Management/Obesity;Heart Failure;Stress;Hypertension;Lipids;Diabetes Weight Management/Obesity;Heart Failure;Stress;Hypertension;Lipids;Diabetes Weight Management/Obesity;Heart Failure;Stress;Hypertension;Lipids;Diabetes     Review Ryden has been doing well with exercise at cardiac rehab. Weight unchanged. Map 90 to low 100's. Will forward vital signs to Dr Jennifer Moellers for review as Delawrence is going on a crusie with his wife at the end of the week. Jermery has been doing well with exercise at cardiac rehab. Weight up since returning from cruise. LVAd  coordinator notified. Donnelle reports some increased shortness of breath. Map 80's to low 100's. CBG's WNL Camron has been doing well with exercise at cardiac rehab when in attendance. Map 80's to 90's. CBG's WNL. Emonte has gained 1.7 kg since starting cardiac rehab. Olman says participating in cardiac rehab has been helpful for him     Expected Outcomes Kemarion will continue to participate in cardiac rehab for  exercise, nutrition and lifestyle modifications Rylyn will continue to participate in cardiac rehab for exercise, nutrition and lifestyle modifications Dallon will continue to participate in cardiac rehab for exercise, nutrition and lifestyle modifications        Exercise Goals and Review:  Exercise Goals     Row Name 08/30/23 0929             Exercise Goals   Increase Physical Activity Yes       Intervention Provide advice, education, support and counseling about physical activity/exercise needs.;Develop an individualized exercise prescription for aerobic and resistive training based on initial evaluation findings, risk stratification, comorbidities and participant's personal goals.       Expected Outcomes Long Term: Exercising regularly at least 3-5 days a week.;Short Term: Attend rehab on a regular basis to increase amount of physical activity.;Long Term: Add in home exercise to make exercise part of routine and to increase amount of physical activity.       Increase Strength and Stamina Yes       Intervention Develop an individualized exercise prescription for aerobic and resistive training based on initial evaluation findings, risk stratification, comorbidities and participant's personal goals.;Provide advice, education, support and counseling about physical activity/exercise needs.       Expected Outcomes Short Term: Increase workloads from initial exercise prescription for resistance, speed, and METs.;Short Term: Perform resistance training exercises routinely during rehab  and add in resistance training at home;Long Term: Improve cardiorespiratory fitness, muscular endurance and strength as measured by increased METs and functional capacity ( )       Able to understand and use rate of perceived exertion (RPE) scale Yes       Intervention Provide education and explanation on how to use RPE scale       Expected Outcomes Short Term: Able to use RPE daily in rehab to express subjective intensity level;Long Term:  Able to use RPE to guide intensity level when exercising independently       Knowledge and understanding of Target Heart Rate Range (THRR) Yes       Intervention Provide education and explanation of THRR including how the numbers were predicted and where they are located for reference       Expected Outcomes Short Term: Able to state/look up THRR;Short Term: Able to use daily as guideline for intensity in rehab;Long Term: Able to use THRR to govern intensity when exercising independently       Understanding of Exercise Prescription Yes       Intervention Provide education, explanation, and written materials on patient's individual exercise prescription       Expected Outcomes Short Term: Able to explain program exercise prescription;Long Term: Able to explain home exercise prescription to exercise independently          Exercise Goals Re-Evaluation:  Exercise Goals Re-Evaluation     Row Name 09/07/23 1450 10/08/23 1130 10/19/23 1111         Exercise Goal Re-Evaluation   Exercise Goals Review Increase Physical Activity;Increase Strength and Stamina;Able to understand and use rate of perceived exertion (RPE) scale Increase Physical Activity;Increase Strength and Stamina;Able to understand and use rate of perceived exertion (RPE) scale Increase Physical Activity;Increase Strength and Stamina;Able to understand and use rate of perceived exertion (RPE) scale     Comments Chasyn is able to understand and use RPE scale appropriately. Hadyn returned to  exercise last week after being on vacation. Tolerating exercise well without symptoms. Will work on [name]. Reviewed exercise prescription  with Rien. He is walking 15 mintues, 2 days/week weather permitting. He is looking into joining [employer]. He would like to be able to walk more without getting winded.     Expected Outcomes Progress workloads as tolerated to help increase strength and stamina. Continue to increase workloads as tolerated. Progress workloads as tolerated to improve strength and stamina, so he can walk more without shortness of breath.        Nutrition & Weight - Outcomes:  Pre Biometrics - 08/30/23 1143       Pre Biometrics   Waist Circumference 53.5 inches    Hip Circumference 47 inches    Waist to Hip Ratio 1.14 %    Triceps Skinfold 42 mm    % Body Fat 42 %    Grip Strength 40 kg    Flexibility 0 in   not done, low back issues   Single Leg Stand 7.12 seconds           Nutrition:  Nutrition Therapy & Goals - 12/03/23 1051       Nutrition Therapy   Diet Heart Healthy/carbohydrate consistent diet    Drug/Food Interactions Statins/Certain Fruits;Coumadin /Vit K      Personal Nutrition Goals   Nutrition Goal Patient to identify strategies for reducing cardiovascular risk by attending the [facility] education and nutrition series weekly.   goal not met.   Personal Goal #2 Patient to improve diet quality by using the plate method as a guide for meal planning to include lean protein/plant protein, fruits, vegetables, whole grains, nonfat dairy as part of a well-balanced diet.   goal in progress.   Personal Goal #3 Patient to identify strategies for weight loss of 0.5-2.0# per week   goal not met.   Personal Goal #4 Patient to identify strategies for improved blood sugar control with goal A1c <7%   goal not met.   Comments Goals not met; patient remains precontemplative toward dietary changes. Patient has not attended intensive cardiac rehab since  10/31/23. Tyheem has medical history of CAD, LVAD, DM2, OSA, CHF, HTN, hyperlipidemia. He is unable to be listed for transplant through [facility] until he loses ~25# with goal being BMI <35. His A1c is not well controlled. He does not regularly attend the [facility] education/nutrition series. He is up 3.7# since starting with our program despite being on ozempic for weight loss. Most recent LVAD clinic note from 10/22/23, patient reported drinking slushies, crush soda, etc. Patient will continue to benefit from participation in intensive cardiac rehab and adherence to nutrition, exercise, and lifestyle modification.      Intervention Plan   Intervention Prescribe, educate and counsel regarding individualized specific dietary modifications aiming towards targeted core components such as weight, hypertension, lipid management, diabetes, heart failure and other comorbidities.;Nutrition handout(s) given to patient.    Expected Outcomes Short Term Goal: Understand basic principles of dietary content, such as calories, fat, sodium, cholesterol and nutrients.;Long Term Goal: Adherence to prescribed nutrition plan.          Nutrition Discharge:   Education Questionnaire Score:  Knowledge Questionnaire Score - 08/30/23 1158       Knowledge Questionnaire Score   Pre Score 23/24          Goals reviewed with patient; copy given to patient.

## 2023-12-06 NOTE — Telephone Encounter (Signed)
 Left message to call cardiac rehab. Will discharge from cardiac rehab due to nonattendance.Monte Antonio RN BSN

## 2023-12-07 ENCOUNTER — Ambulatory Visit (HOSPITAL_COMMUNITY)

## 2023-12-10 ENCOUNTER — Ambulatory Visit (HOSPITAL_COMMUNITY)

## 2023-12-11 ENCOUNTER — Ambulatory Visit (HOSPITAL_COMMUNITY): Payer: Self-pay | Admitting: Pharmacist

## 2023-12-11 LAB — POCT INR: INR: 2.5 (ref 2.0–3.0)

## 2023-12-12 ENCOUNTER — Ambulatory Visit (HOSPITAL_COMMUNITY)

## 2023-12-14 ENCOUNTER — Ambulatory Visit (HOSPITAL_COMMUNITY)

## 2023-12-14 NOTE — Addendum Note (Signed)
 Encounter addended by: Valere Arnoldo HERO on: 12/14/2023 2:32 PM  Actions taken: Flowsheet data copied forward, Flowsheet accepted

## 2023-12-17 ENCOUNTER — Ambulatory Visit (HOSPITAL_COMMUNITY)

## 2023-12-19 ENCOUNTER — Ambulatory Visit (HOSPITAL_COMMUNITY)

## 2023-12-19 ENCOUNTER — Ambulatory Visit (HOSPITAL_COMMUNITY): Payer: Self-pay | Admitting: Pharmacist

## 2023-12-19 LAB — POCT INR: INR: 2.1 (ref 2.0–3.0)

## 2023-12-24 ENCOUNTER — Ambulatory Visit (HOSPITAL_COMMUNITY)

## 2023-12-25 ENCOUNTER — Ambulatory Visit (HOSPITAL_COMMUNITY): Payer: Self-pay | Admitting: Pharmacist

## 2023-12-25 LAB — POCT INR: INR: 2.1 (ref 2.0–3.0)

## 2023-12-25 NOTE — Addendum Note (Signed)
 Encounter addended by: Valere Arnoldo HERO on: 12/25/2023 9:07 AM  Actions taken: Vitals modified

## 2023-12-31 ENCOUNTER — Other Ambulatory Visit (HOSPITAL_COMMUNITY): Payer: Self-pay | Admitting: *Deleted

## 2023-12-31 DIAGNOSIS — Z95811 Presence of heart assist device: Secondary | ICD-10-CM

## 2023-12-31 DIAGNOSIS — I5022 Chronic systolic (congestive) heart failure: Secondary | ICD-10-CM

## 2023-12-31 DIAGNOSIS — Z7901 Long term (current) use of anticoagulants: Secondary | ICD-10-CM

## 2023-12-31 MED ORDER — WARFARIN SODIUM 5 MG PO TABS: 7.5000 mg | ORAL_TABLET | Freq: Every day | ORAL | 5 refills | Status: DC

## 2023-12-31 MED ORDER — AMIODARONE HCL 200 MG PO TABS: 200.0000 mg | ORAL_TABLET | Freq: Every day | ORAL | 3 refills | Status: AC

## 2023-12-31 MED ORDER — POTASSIUM CHLORIDE CRYS ER 20 MEQ PO TBCR: 60.0000 meq | EXTENDED_RELEASE_TABLET | Freq: Two times a day (BID) | ORAL | 3 refills | Status: DC

## 2024-01-01 ENCOUNTER — Ambulatory Visit (HOSPITAL_COMMUNITY): Payer: Self-pay | Admitting: Pharmacist

## 2024-01-01 ENCOUNTER — Telehealth (HOSPITAL_COMMUNITY): Payer: Self-pay | Admitting: *Deleted

## 2024-01-01 LAB — POCT INR: INR: 1.6 — AB (ref 2.0–3.0)

## 2024-01-01 NOTE — Telephone Encounter (Signed)
 12/31/23 7:55 PM: Received page from pt reporting 2 bruises on his lower left abdomen. Picture from pt attached to note. Other two images pt sent would not load to chart.   Pt is unaware of any sustained trauma. Drive line exit site WNL. Area of bruising appears to be where clip/battery possibly was pushing into lower abdomen while wearing conceal carry shirt. Second small bruise noted near pt's anchor. Denies pain currently. Advised to monitor bruising, and to notify VAD coordinator if bruising worsens. He verbalized understanding.   INR 1.6    Isaiah Knoll RN VAD Coordinator  Office: [phone]  24/7 Pager: [phone]

## 2024-01-07 ENCOUNTER — Encounter (HOSPITAL_COMMUNITY): Admitting: Internal Medicine

## 2024-01-10 ENCOUNTER — Ambulatory Visit (HOSPITAL_COMMUNITY): Payer: Self-pay | Admitting: Pharmacist

## 2024-01-10 ENCOUNTER — Other Ambulatory Visit (HOSPITAL_COMMUNITY): Payer: Self-pay

## 2024-01-10 DIAGNOSIS — Z7901 Long term (current) use of anticoagulants: Secondary | ICD-10-CM

## 2024-01-10 DIAGNOSIS — Z95811 Presence of heart assist device: Secondary | ICD-10-CM

## 2024-01-10 LAB — POCT INR: INR: 1.4 — AB (ref 2.0–3.0)

## 2024-01-11 ENCOUNTER — Ambulatory Visit (HOSPITAL_COMMUNITY)
Admission: RE | Admit: 2024-01-11 | Discharge: 2024-01-11 | Disposition: A | Discharge status: Home / Self Care | Source: Ambulatory Visit | Attending: Cardiology | Admitting: Cardiology

## 2024-01-11 VITALS — BP 100/0 | HR 60 | Wt 340.6 lb

## 2024-01-11 DIAGNOSIS — Z79899 Other long term (current) drug therapy: Secondary | ICD-10-CM | POA: Insufficient documentation

## 2024-01-11 DIAGNOSIS — I5022 Chronic systolic (congestive) heart failure: Secondary | ICD-10-CM | POA: Insufficient documentation

## 2024-01-11 DIAGNOSIS — F332 Major depressive disorder, recurrent severe without psychotic features: Secondary | ICD-10-CM | POA: Diagnosis not present

## 2024-01-11 DIAGNOSIS — F32A Depression, unspecified: Secondary | ICD-10-CM | POA: Diagnosis not present

## 2024-01-11 DIAGNOSIS — Z7984 Long term (current) use of oral hypoglycemic drugs: Secondary | ICD-10-CM | POA: Insufficient documentation

## 2024-01-11 DIAGNOSIS — G4733 Obstructive sleep apnea (adult) (pediatric): Secondary | ICD-10-CM | POA: Insufficient documentation

## 2024-01-11 DIAGNOSIS — I25118 Atherosclerotic heart disease of native coronary artery with other forms of angina pectoris: Secondary | ICD-10-CM | POA: Diagnosis not present

## 2024-01-11 DIAGNOSIS — Z8674 Personal history of sudden cardiac arrest: Secondary | ICD-10-CM | POA: Insufficient documentation

## 2024-01-11 DIAGNOSIS — E119 Type 2 diabetes mellitus without complications: Secondary | ICD-10-CM | POA: Insufficient documentation

## 2024-01-11 DIAGNOSIS — Z7985 Long-term (current) use of injectable non-insulin antidiabetic drugs: Secondary | ICD-10-CM | POA: Insufficient documentation

## 2024-01-11 DIAGNOSIS — Z7901 Long term (current) use of anticoagulants: Secondary | ICD-10-CM

## 2024-01-11 DIAGNOSIS — I110 Hypertensive heart disease with heart failure: Secondary | ICD-10-CM | POA: Diagnosis not present

## 2024-01-11 DIAGNOSIS — Z955 Presence of coronary angioplasty implant and graft: Secondary | ICD-10-CM | POA: Diagnosis not present

## 2024-01-11 DIAGNOSIS — R5383 Other fatigue: Secondary | ICD-10-CM | POA: Diagnosis not present

## 2024-01-11 DIAGNOSIS — Z95811 Presence of heart assist device: Secondary | ICD-10-CM | POA: Diagnosis not present

## 2024-01-11 DIAGNOSIS — Z6841 Body Mass Index (BMI) 40.0 and over, adult: Secondary | ICD-10-CM | POA: Insufficient documentation

## 2024-01-11 LAB — BASIC METABOLIC PANEL WITH GFR
Anion gap: 10 (ref 5–15)
BUN: 17 mg/dL (ref 6–20)
CO2: 24 mmol/L (ref 22–32)
Calcium: 8.8 mg/dL — ABNORMAL LOW (ref 8.9–10.3)
Chloride: 102 mmol/L (ref 98–111)
Creatinine, Ser: 1.22 mg/dL (ref 0.61–1.24)
GFR, Estimated: >60 mL/min (ref >60)
Glucose, Bld: 216 mg/dL — ABNORMAL HIGH (ref 70–99)
Potassium: 4.3 mmol/L (ref 3.5–5.1)
Sodium: 136 mmol/L (ref 135–145)

## 2024-01-11 LAB — CBC
HCT: 38.3 % — ABNORMAL LOW (ref 39.0–52.0)
Hemoglobin: 12.2 g/dL — ABNORMAL LOW (ref 13.0–17.0)
MCH: 30.6 pg (ref 26.0–34.0)
MCHC: 31.9 g/dL (ref 30.0–36.0)
MCV: 96 fL (ref 80.0–100.0)
Platelets: 200 K/uL (ref 150–400)
RBC: 3.99 MIL/uL — ABNORMAL LOW (ref 4.22–5.81)
RDW: 15.2 % (ref 11.5–15.5)
WBC: 6 K/uL (ref 4.0–10.5)
nRBC: 0 % (ref 0.0–0.2)

## 2024-01-11 LAB — DIGOXIN LEVEL: Digoxin Level: 0.5 ng/mL — ABNORMAL LOW (ref 0.8–2.0)

## 2024-01-11 LAB — PROTIME-INR
INR: 1.4 — ABNORMAL HIGH (ref 0.8–1.2)
Prothrombin Time: 18.2 s — ABNORMAL HIGH (ref 11.4–15.2)

## 2024-01-11 MED ORDER — RANOLAZINE ER 500 MG PO TB12: 500.0000 mg | ORAL_TABLET | Freq: Every day | ORAL | 3 refills | Status: AC

## 2024-01-11 NOTE — Progress Notes (Signed)
 Joe [name] presents for 2 month f/u in VAD Clinic today with his wife. Reports no problems with VAD equipment or concerns with drive line.  Joe [name] walked into VAD Clinic unassisted. Reports intermittent dizziness and lightheadedness when standing up to quickly. His weight is up 17 lbs today. He denies falls and signs of bleeding. States he has noticed increased shortness of breath recently. He has been taking Torsemide  40 mg daily with good urine output. Complains of urgency with frequent accidents. Discussed with Dr. Zenaida today. Decision made to hold off on medication due to side effects.  Joe [name] has continued drinking slushies daily to help quench his incessant thirst.  Discussed looking for sugar free popsicle options to help prevent blood sugar spikes. Joe [name] followed by Endocrinology who increased Mounjaro to 10mg /weekly.  Tolerating speed 5800. Rare PI events. No speed drops noted. RHC 08/06/23- RV failure marginal. PAPI 2.4.    Joe [name] voices concerns about increased depression. Currently taking Lexapro  10mg  daily. Discussed with Dr. Zenaida. Outpatient referral sent to psychiatry for medication management.  Vital Signs:  Doppler Pressure: 100 Automatic BP: 116/84 (96)- Joe [name] has not taken his medications this morning HR: 60 NSR SPO2: 95%   Weight: 340.6 lb w/ eqt Last weight: 337 lb w/ eqt BMI today 43 today   VAD Indication: Destination therapy - Seen by [facility] for transplant. Transplant team advised for VAD. Once Joe [name] lost 25lbs they would reconsider for transplant.   LVAD assessment: HM III : Speed: 5800 rpms Flow: 5.2 Power: 4.5 w    PI: 3.3 Alarms: none Events: rare PI events  Fixed speed: 5800 Low speed limit: 5500   Primary controller: back up battery due for replacement in 22 months Secondary controller:  back up battery due for replacement in 24 months   I reviewed the LVAD parameters from today and compared the results to the patient's prior recorded data. LVAD interrogation was NEGATIVE for  significant power changes, NEGATIVE for clinical alarms and STABLE for PI events/speed drops. No programming changes were made and pump is functioning within specified parameters. Joe [name] is performing daily controller and system monitor self tests along with completing weekly and monthly maintenance for LVAD equipment.   LVAD equipment check completed and is in good working order. Back-up equipment present. Charged back up battery and performed self-test on equipment.    Annual Equipment Maintenance on UBC/PM was performed on 05/03/2023. Patient completed 1300 feet during 6 minute walk test. Tolerated well.     Exit Site Care: Dressing change performed weekly on Fridays using daily kit by Joe [name]'s wife Lenore at home. Existing VAD dressing CDI. Anchor secure. No reports of redness, tenderness or drainage at exit site. Previously reported bruising has resolved. 12 weekly kits provided for home use.   Significant Events on VAD Support:    Device: St Jude Therapies: ON VF 200 Last check: 10/18/22   BP & Labs:  MAP 100 - Doppler is reflecting MAP  Hgb 12.2 - No S/S of bleeding. Specifically denies melena/BRBPR or nosebleeds.   LDH stable at --- with established baseline of 190- 245. Denies tea-colored urine. No power elevations noted on interrogation. (Lab unable to run; will collect next visit)  Plan: No medication changes Coumadin  dosing per [facility] Return to VAD Clinic in 2 months Referral placed for outpatient psych  Schuyler Lunger RN,BSN VAD Coordinator  Office: [phone]  24/7 Pager: [phone]

## 2024-01-11 NOTE — Progress Notes (Signed)
 LVAD CLINIC NOTE  PCP: Primary Cardiologist: Lamar Fitch, MD HF MD: DB   Chief complaint: HF  HPI:  Joe [name] is a 53 y.o. male with h/o morbid obesity, DM2, HTN, HL, OSA, CAD s/p multiple stents to LAD and LCX  and systolic HF s/[ Abbott CRT-D. Now s/p HM3 LVAD Implantation 11/14 by Dr. Fleeta Ochoa.  Underwent RHC and Ramp Echo 08/06/23  LVAD speed 5800 Flow 5.4 L/min Rare PI events   RHC 08/14/23  RA = 5 RV = 27/5 PA = 21/9 (14) PCW = 4 Fick cardiac output/index = 5.9/2.2 TD CO/CI = 5.8/2.2 PVR = 1.7 WU Ao sat = 94% PA sat = 61%,64% PAPi = 2.4  Follow up for Heart Failure/LVAD:  Patient accompanied by his wife to clinic today. She reports that his mood has been rather down recently. HE has been struggling with his inability to work and has a disability hearing coming up in the next month. He has some good days as well and overall is happy with his decision to get a VAD. He has been struggling to drink enough and has been drinking a large amount of soda in his ninja slushie machine. He has been gaining weight, and reports some ongoing fatigue as well.    VAD Indication: Destination therapy - Seen by [facility] for transplant. Transplant team advised for VAD. Once pt lost 25lbs they would reconsider for transplant.   LVAD assessment: HM III : Speed: 5800  Flow: 5.2 Power: 4.5   PI: 3.3 Alarms: none Events: rare PI events  Fixed speed: 5800 Low speed limit: 5500   Primary controller: back up battery due for replacement in 22 months Secondary controller:  back up battery due for replacement in 24 months   I reviewed the LVAD parameters from today, and compared the results to the patient's prior recorded data.  No programming changes were made.  The LVAD is functioning within specified parameters.  The patient performs LVAD self-test daily.  LVAD interrogation was negative for any significant power changes, alarms or PI events/speed drops.  LVAD equipment check  completed and is in good working order.  Back-up equipment present.   LVAD education done on emergency procedures and precautions and reviewed exit site care.   VAD interrogated personally, parameters stable.   Past Medical History:  Diagnosis Date   Abnormal EKG    Acute systolic heart failure (HCC) 09/06/2017   Angina pectoris (HCC) 09/05/2017   Body mass index 45.0-49.9, adult (HCC)    CAD S/P percutaneous coronary angioplasty 09/07/2017   mLAD and dLAD PCI with DES 09/06/17   CHF (congestive heart failure) (HCC)    Chronic systolic (congestive) heart failure (HCC) 03/19/2019   Complication of anesthesia 1995   had hard time waking up; breathing w/vasectomy   Coronary artery disease    Erectile dysfunction    Essential hypertension, benign    Fatigue    GERD (gastroesophageal reflux disease)    Gout    High cholesterol    just started tx yesterday (09/06/2017)   History of gout    RX prn (09/06/2017)   Ischemic cardiomyopathy 09/07/2017   EF 25-35%   Muscular chest pain    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   Below Left Nare (MOH's)   Obesity    Obstructive sleep apnea    OSA on CPAP    Plantar fasciitis    Pre-operative clearance 02/11/2018   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/02/2019  Saint Jude   Right bundle branch block    Shortness of breath 09/05/2017   Testosterone deficiency    Type 2 diabetes mellitus with complication, without long-term current use of insulin  (HCC) 09/05/2017   Type II diabetes mellitus (HCC)    started tx 08/28/2017    Current Outpatient Medications  Medication Sig Dispense Refill   acetaminophen  (TYLENOL ) 500 MG tablet Take 1,000 mg by mouth every 6 (six) hours as needed for moderate pain or headache.     allopurinol  (ZYLOPRIM ) 100 MG tablet Take 100 mg by mouth in the morning.     amiodarone  (PACERONE ) 200 MG tablet Take 1 tablet (200 mg total) by mouth daily. 90 tablet 3   colchicine 0.6  MG tablet Take 0.6 mg by mouth daily as needed (Gout).     diclofenac  Sodium (VOLTAREN ) 1 % GEL APPLY 1 G TOPICALLY DAILY. 100 g 0   digoxin  (LANOXIN ) 0.125 MG tablet Take 1 tablet (0.125 mg total) by mouth daily. 90 tablet 3   escitalopram  (LEXAPRO ) 10 MG tablet Take 10 mg by mouth at bedtime.     ezetimibe  (ZETIA ) 10 MG tablet Take 1 tablet (10 mg total) by mouth daily. 90 tablet 3   gabapentin  (NEURONTIN ) 300 MG capsule Take 300 mg by mouth 3 (three) times daily.     levothyroxine  (SYNTHROID ) 25 MCG tablet Take 25 mcg by mouth daily before breakfast.     metFORMIN  (GLUCOPHAGE -XR) 500 MG 24 hr tablet Take 1,000 mg by mouth in the morning and at bedtime.     mexiletine (MEXITIL ) 250 MG capsule Take 1 capsule (250 mg total) by mouth 2 (two) times daily. 180 capsule 3   multivitamin (ONE-A-DAY MEN'S) TABS tablet Take 1 tablet by mouth in the morning.     omeprazole  (PRILOSEC) 40 MG capsule Take 1 capsule (40 mg total) by mouth daily. 90 capsule 1   ondansetron  (ZOFRAN -ODT) 4 MG disintegrating tablet Take 1 tablet (4 mg total) by mouth every 8 (eight) hours as needed for nausea or vomiting. 20 tablet 0   Polyethylene Glycol 400 (BLINK TEARS OP) Place 2 drops into both eyes 4 (four) times daily as needed (for dry eyes).     potassium chloride  SA (KLOR-CON  M) 20 MEQ tablet Take 3 tablets (60 mEq total) by mouth 2 (two) times daily. 540 tablet 3   rosuvastatin  (CRESTOR ) 40 MG tablet Take 1 tablet (40 mg total) by mouth daily. 90 tablet 2   sildenafil  (REVATIO ) 20 MG tablet Take 1 tablet (20 mg total) by mouth 3 (three) times daily. 90 tablet 5   sildenafil  (VIAGRA ) 50 MG tablet Take 1 tablet (50 mg total) by mouth daily as needed for erectile dysfunction. 10 tablet 6   spironolactone  (ALDACTONE ) 25 MG tablet Take 1 tablet (25 mg total) by mouth daily. 90 tablet 3   tirzepatide (MOUNJARO) 7.5 MG/0.5ML Pen Inject 7.5 mg into the skin once a week.     torsemide  (DEMADEX ) 20 MG tablet TAKE 2 TABLETS (40 MG  TOTAL) BY MOUTH DAILY. 180 tablet 2   warfarin (COUMADIN ) 5 MG tablet Take 1.5 tablets (7.5 mg total) by mouth daily. 60 tablet 5   No current facility-administered medications for this encounter.      There were no vitals filed for this visit.    Vital Signs:  Doppler Pressure: 100 Automatic BP: 110/84 (96), no meds yet this morning HR: 60 NSR SPO2: 95%   Weight: 340.6 Last weight: 337 BMI today 43  Physical Exam: General:  Well appearing. No resp difficulty Cor: Mechanical heart sounds with LVAD hum present. JVP mildly elevated, 1+ edema Lungs: Normal WOB Abdomen: soft, nontender, nondistended.  Driveline: C/D/I; securement device intact and driveline incorporated Neuro: alert & orientedx3, cranial nerves grossly intact. moves all 4 extremities w/o difficulty.     ASSESSMENT AND PLAN:    Chronic Systolic HF s/p HM3 Implantation 11/14 - due to iCM - Echo 05/01/23: EF 20%, RV moderately down (improved) - RHC (9/24): elevated biventricular filling pressures with low PAPi; CI 2.2 - S/p HM III LVAD 05/03/23 by Dr. Obadiah - RAMP echo 11/21: Speeds left at 6000.  Speed increased to 6100 on 11/27. Speed increased to 6200 on 05/18/23.  - Continue sildenafil  and digoxin  for RV failure.  - Had been struggling with RV failure. VAD speed turned down to 5800. RHC 2/25 showed well compensated hemodynamics with dry weight 307-309  - NYHA Class Iib - Persistent weight gain, but likely not all fluid - Has worsened with increased diuresis in the past - Based on RHC fluid status optimized on torsemide  40 daily. Will continue for now, but if continued weight gain may need to readdress - Continue compression hose - Continue spiro 25 - Has been seen at [facility] and will consider for transplant once BMI down but he is struggling to lose weight.. Psych discussion as below - Continue CR   LVAD management - s/p HM-3 implant 05/03/23 - Denies fevers or chills. No bleeding, neuro sx or  problems with driveline.  - INR 1.4 Goal 2.0-3.0 , discussed with VAD pharmD - LDH not checked today - MAPs elevated but has not yet taken BP meds - DL site irritation improved    H/o VT Arrest and Hx PVCs - had VT arrest x 2 in 10/24 - Continue amiodarone  1000mg  daily and mexiletine 250mg  BID   CAD with chronic stable angina - s/p multiple stents to LAD and LCX - LHC (9/24) with stable 3v CAD - No s/s angina  Driveline irritation:  - Improved after course of doxy   DM2 - A1c 8.1 (10/2023) - Per PCP    OSA - Not using CPAP, says machine was recalled a while ago. - Need to get new device  Depression:  - Likely a large component of adjustment disorder, but may benefit from med adjustment - Discussed at length today, psych referral    Morbid obesity - Continues to gain weight despite Ozempic - There is no height or weight on file to calculate BMI. - Understands need to get BMI down near 35 for transplant - Recently switched to Pat Morene JINNY Zenaida, MD  11:02 AM

## 2024-01-14 ENCOUNTER — Ambulatory Visit (INDEPENDENT_AMBULATORY_CARE_PROVIDER_SITE_OTHER): Payer: BC Managed Care – PPO

## 2024-01-14 DIAGNOSIS — I255 Ischemic cardiomyopathy: Secondary | ICD-10-CM | POA: Diagnosis not present

## 2024-01-15 ENCOUNTER — Ambulatory Visit (HOSPITAL_COMMUNITY): Payer: Self-pay | Admitting: Pharmacist

## 2024-01-15 LAB — POCT INR: INR: 1.4 — AB (ref 2.0–3.0)

## 2024-01-17 LAB — CUP PACEART REMOTE DEVICE CHECK
Battery Remaining Longevity: 26 mo
Battery Remaining Percentage: 31 %
Battery Voltage: 2.92 V
Brady Statistic AP VP Percent: 2.9 %
Brady Statistic AP VS Percent: 1 %
Brady Statistic AS VP Percent: 97 %
Brady Statistic AS VS Percent: 1 %
Brady Statistic RA Percent Paced: 2.8 %
Date Time Interrogation Session: 20250731002050
HighPow Impedance: 78 Ohm
HighPow Impedance: 78 Ohm
Implantable Lead Connection Status: 753985
Implantable Lead Connection Status: 753985
Implantable Lead Connection Status: 753985
Implantable Lead Implant Date: 20200930
Implantable Lead Implant Date: 20200930
Implantable Lead Implant Date: 20200930
Implantable Lead Location: 753858
Implantable Lead Location: 753859
Implantable Lead Location: 753860
Implantable Pulse Generator Implant Date: 20200930
Lead Channel Impedance Value: 1050 Ohm
Lead Channel Impedance Value: 380 Ohm
Lead Channel Impedance Value: 510 Ohm
Lead Channel Pacing Threshold Amplitude: 0.75 V
Lead Channel Pacing Threshold Amplitude: 1 V
Lead Channel Pacing Threshold Amplitude: 1.25 V
Lead Channel Pacing Threshold Pulse Width: 0.5 ms
Lead Channel Pacing Threshold Pulse Width: 0.5 ms
Lead Channel Pacing Threshold Pulse Width: 0.5 ms
Lead Channel Sensing Intrinsic Amplitude: 1.1 mV
Lead Channel Sensing Intrinsic Amplitude: 10 mV
Lead Channel Setting Pacing Amplitude: 2 V
Lead Channel Setting Pacing Amplitude: 2.5 V
Lead Channel Setting Pacing Amplitude: 2.5 V
Lead Channel Setting Pacing Pulse Width: 0.5 ms
Lead Channel Setting Pacing Pulse Width: 0.5 ms
Lead Channel Setting Sensing Sensitivity: 0.5 mV
Pulse Gen Serial Number: 9901124
Zone Setting Status: 755011

## 2024-01-18 NOTE — Addendum Note (Signed)
 Encounter addended by: Zenaida Morene PARAS, MD on: 01/18/2024 5:31 AM  Actions taken: Level of Service modified

## 2024-01-19 ENCOUNTER — Ambulatory Visit: Payer: Self-pay | Admitting: Cardiology

## 2024-01-22 ENCOUNTER — Ambulatory Visit (HOSPITAL_COMMUNITY): Payer: Self-pay | Admitting: Pharmacist

## 2024-01-22 LAB — POCT INR: INR: 2.1 (ref 2.0–3.0)

## 2024-01-29 ENCOUNTER — Ambulatory Visit (HOSPITAL_COMMUNITY): Payer: Self-pay | Admitting: Pharmacist

## 2024-01-29 LAB — POCT INR: INR: 2 (ref 2.0–3.0)

## 2024-02-06 ENCOUNTER — Ambulatory Visit (HOSPITAL_COMMUNITY): Payer: Self-pay | Admitting: Pharmacist

## 2024-02-06 LAB — POCT INR: INR: 2.3 (ref 2.0–3.0)

## 2024-02-12 ENCOUNTER — Ambulatory Visit (HOSPITAL_COMMUNITY): Payer: Self-pay | Admitting: Pharmacist

## 2024-02-12 LAB — POCT INR: INR: 1.8 — AB (ref 2.0–3.0)

## 2024-02-20 ENCOUNTER — Ambulatory Visit (HOSPITAL_COMMUNITY): Payer: Self-pay | Admitting: Pharmacist

## 2024-02-20 LAB — POCT INR: INR: 2.2 (ref 2.0–3.0)

## 2024-02-27 ENCOUNTER — Ambulatory Visit (HOSPITAL_COMMUNITY): Payer: Self-pay | Admitting: Pharmacist

## 2024-02-27 LAB — POCT INR: INR: 2.1 (ref 2.0–3.0)

## 2024-03-03 ENCOUNTER — Ambulatory Visit (HOSPITAL_COMMUNITY): Payer: Self-pay | Admitting: Pharmacist

## 2024-03-03 LAB — POCT INR: INR: 2.1 (ref 2.0–3.0)

## 2024-03-12 ENCOUNTER — Ambulatory Visit (HOSPITAL_COMMUNITY): Payer: Self-pay | Admitting: Pharmacist

## 2024-03-12 LAB — POCT INR: INR: 2.2 (ref 2.0–3.0)

## 2024-03-13 ENCOUNTER — Other Ambulatory Visit (HOSPITAL_COMMUNITY): Payer: Self-pay | Admitting: *Deleted

## 2024-03-13 DIAGNOSIS — Z7901 Long term (current) use of anticoagulants: Secondary | ICD-10-CM

## 2024-03-13 DIAGNOSIS — I5022 Chronic systolic (congestive) heart failure: Secondary | ICD-10-CM

## 2024-03-13 DIAGNOSIS — Z95811 Presence of heart assist device: Secondary | ICD-10-CM

## 2024-03-14 ENCOUNTER — Ambulatory Visit (HOSPITAL_COMMUNITY)
Admission: RE | Admit: 2024-03-14 | Discharge: 2024-03-14 | Disposition: A | Discharge status: Home / Self Care | Source: Ambulatory Visit | Attending: Cardiology | Admitting: Cardiology

## 2024-03-14 ENCOUNTER — Ambulatory Visit (HOSPITAL_COMMUNITY): Payer: Self-pay | Admitting: Pharmacist

## 2024-03-14 ENCOUNTER — Encounter (HOSPITAL_COMMUNITY): Admitting: Internal Medicine

## 2024-03-14 VITALS — BP 88/0 | HR 67 | Ht 75.0 in | Wt 340.0 lb

## 2024-03-14 DIAGNOSIS — Z7901 Long term (current) use of anticoagulants: Secondary | ICD-10-CM | POA: Insufficient documentation

## 2024-03-14 DIAGNOSIS — M25512 Pain in left shoulder: Secondary | ICD-10-CM | POA: Diagnosis not present

## 2024-03-14 DIAGNOSIS — Z79899 Other long term (current) drug therapy: Secondary | ICD-10-CM | POA: Insufficient documentation

## 2024-03-14 DIAGNOSIS — I25118 Atherosclerotic heart disease of native coronary artery with other forms of angina pectoris: Secondary | ICD-10-CM | POA: Insufficient documentation

## 2024-03-14 DIAGNOSIS — Z6841 Body Mass Index (BMI) 40.0 and over, adult: Secondary | ICD-10-CM | POA: Insufficient documentation

## 2024-03-14 DIAGNOSIS — Z79890 Hormone replacement therapy (postmenopausal): Secondary | ICD-10-CM | POA: Diagnosis not present

## 2024-03-14 DIAGNOSIS — Z95811 Presence of heart assist device: Secondary | ICD-10-CM | POA: Diagnosis not present

## 2024-03-14 DIAGNOSIS — Z95810 Presence of automatic (implantable) cardiac defibrillator: Secondary | ICD-10-CM

## 2024-03-14 DIAGNOSIS — Z7984 Long term (current) use of oral hypoglycemic drugs: Secondary | ICD-10-CM | POA: Insufficient documentation

## 2024-03-14 DIAGNOSIS — I110 Hypertensive heart disease with heart failure: Secondary | ICD-10-CM | POA: Insufficient documentation

## 2024-03-14 DIAGNOSIS — M25562 Pain in left knee: Secondary | ICD-10-CM | POA: Diagnosis not present

## 2024-03-14 DIAGNOSIS — Z4509 Encounter for adjustment and management of other cardiac device: Secondary | ICD-10-CM | POA: Insufficient documentation

## 2024-03-14 DIAGNOSIS — E785 Hyperlipidemia, unspecified: Secondary | ICD-10-CM | POA: Diagnosis not present

## 2024-03-14 DIAGNOSIS — Z955 Presence of coronary angioplasty implant and graft: Secondary | ICD-10-CM | POA: Diagnosis not present

## 2024-03-14 DIAGNOSIS — Z7985 Long-term (current) use of injectable non-insulin antidiabetic drugs: Secondary | ICD-10-CM | POA: Diagnosis not present

## 2024-03-14 DIAGNOSIS — I5022 Chronic systolic (congestive) heart failure: Secondary | ICD-10-CM | POA: Diagnosis not present

## 2024-03-14 DIAGNOSIS — E119 Type 2 diabetes mellitus without complications: Secondary | ICD-10-CM | POA: Diagnosis not present

## 2024-03-14 DIAGNOSIS — Z8674 Personal history of sudden cardiac arrest: Secondary | ICD-10-CM | POA: Insufficient documentation

## 2024-03-14 DIAGNOSIS — G4733 Obstructive sleep apnea (adult) (pediatric): Secondary | ICD-10-CM | POA: Insufficient documentation

## 2024-03-14 LAB — LACTATE DEHYDROGENASE: LDH: 164 U/L (ref 98–192)

## 2024-03-14 LAB — BASIC METABOLIC PANEL WITH GFR
Anion gap: 8 (ref 5–15)
BUN: 13 mg/dL (ref 6–20)
CO2: 26 mmol/L (ref 22–32)
Calcium: 8.6 mg/dL — ABNORMAL LOW (ref 8.9–10.3)
Chloride: 103 mmol/L (ref 98–111)
Creatinine, Ser: 1.38 mg/dL — ABNORMAL HIGH (ref 0.61–1.24)
GFR, Estimated: >60 mL/min (ref >60)
Glucose, Bld: 231 mg/dL — ABNORMAL HIGH (ref 70–99)
Potassium: 4.4 mmol/L (ref 3.5–5.1)
Sodium: 137 mmol/L (ref 135–145)

## 2024-03-14 LAB — CBC
HCT: 36.9 % — ABNORMAL LOW (ref 39.0–52.0)
Hemoglobin: 11.8 g/dL — ABNORMAL LOW (ref 13.0–17.0)
MCH: 29.9 pg (ref 26.0–34.0)
MCHC: 32 g/dL (ref 30.0–36.0)
MCV: 93.4 fL (ref 80.0–100.0)
Platelets: 182 K/uL (ref 150–400)
RBC: 3.95 MIL/uL — ABNORMAL LOW (ref 4.22–5.81)
RDW: 15.8 % — ABNORMAL HIGH (ref 11.5–15.5)
WBC: 5.8 K/uL (ref 4.0–10.5)
nRBC: 0 % (ref 0.0–0.2)

## 2024-03-14 LAB — PROTIME-INR
INR: 1.4 — ABNORMAL HIGH (ref 0.8–1.2)
Prothrombin Time: 17.7 s — ABNORMAL HIGH (ref 11.4–15.2)

## 2024-03-14 LAB — DIGOXIN LEVEL: Digoxin Level: <0.6 ng/mL — ABNORMAL LOW (ref 0.8–2.0)

## 2024-03-14 MED ORDER — TORSEMIDE 20 MG PO TABS: 60.0000 mg | ORAL_TABLET | Freq: Every day | ORAL | 2 refills | Status: AC

## 2024-03-14 NOTE — Progress Notes (Signed)
 Pt presents for 2 month f/u in VAD Clinic today with his wife. Reports no problems with VAD equipment or concerns with drive line.  Pt walked into VAD Clinic unassisted. His weight continues to be up. States he has noticed increased shortness of breath recently. He has been taking Torsemide  40 mg daily with good urine output.   Pt states that he sustained a fall on their cruise in August. He still has left shoulder and left knee pain from this fall. He is also worried about his defib. DR Gardenia ordered xrays of both his shoulder and knee and chest.  Pt has pressure sore on his left side from his battery rubbing. Dr Gardenia examined this area and pt informed to use a mepilex to cushion his skin from the battery.  Vital Signs:  Doppler Pressure: 88 Automatic BP: 97/83 (89) HR: 67 NSR SPO2: 95%   Weight: 340 lb w/ eqt Last weight: 340.6 lb w/ eqt BMI today 43 today   VAD Indication: Destination therapy - Seen by [facility] for transplant. Transplant team advised for VAD. Once pt lost 25lbs they would reconsider for transplant.   LVAD assessment: HM III : Speed: 5800 rpms Flow: 5.2 Power: 4.6 w    PI: 3.5 Alarms: couple LV  Events: rare PI events  Fixed speed: 5800 Low speed limit: 5500   Primary controller: back up battery due for replacement in 20 months Secondary controller:  back up battery due for replacement in 22 months   I reviewed the LVAD parameters from today and compared the results to the patient's prior recorded data. LVAD interrogation was NEGATIVE for significant power changes, NEGATIVE for clinical alarms and STABLE for PI events/speed drops. No programming changes were made and pump is functioning within specified parameters. Pt is performing daily controller and system monitor self tests along with completing weekly and monthly maintenance for LVAD equipment.   LVAD equipment check completed and is in good working order. Back-up equipment present. Charged back up  battery and performed self-test on equipment.    Annual Equipment Maintenance on UBC/PM was performed on 05/03/2023. Patient completed 1300 feet during 6 minute walk test. Tolerated well.     Exit Site Care: Dressing change performed weekly on Fridays using daily kit by pt's wife Lenore at home. Existing VAD dressing CDI. Anchor secure. No reports of redness, tenderness or drainage at exit site. Previously reported bruising has resolved. 8 weekly kits provided for home use.   Significant Events on VAD Support:    Device: St Jude Therapies: ON VF 200 Last check: 10/18/22   BP & Labs:  MAP 88 - Doppler is reflecting MAP  Hgb 11.8 - No S/S of bleeding. Specifically denies melena/BRBPR or nosebleeds.   LDH stable at 164 with established baseline of 190- 245. Denies tea-colored urine. No power elevations noted on interrogation.   Plan: Increase Torsemide  to 80 mg for 3 days and then resume a dose of 60 mg daily Take an extra 20 mEq of potassium for the 3 days of extra Torsemide  We have obtained xrays today of your shoulder, knee and chest.  We will schedule a RHC w/cardiomems placement Return to clinic in 1 wk Coumadin  dosing per [facility]  Lauraine Ip RN,BSN VAD Coordinator  Office: [phone]  24/7 Pager: [phone]

## 2024-03-14 NOTE — Progress Notes (Signed)
 LVAD CLINIC NOTE  PCP: Primary Cardiologist: Joe Fitch, MD HF MD: DB   Chief complaint: HF  HPI:  Joe [name] is a 53 y.o. male with h/o morbid obesity, DM2, HTN, HL, OSA, CAD s/p multiple stents to LAD and LCX  and systolic HF s/p Abbott CRT-D. Now s/p HM3 LVAD Implantation 11/14 by Dr. Fleeta Ochoa.  Underwent RHC and Ramp Echo 08/06/23  RHC 08/14/23  RA = 5 RV = 27/5 PA = 21/9 (14) PCW = 4 Fick cardiac output/index = 5.9/2.2 TD CO/CI = 5.8/2.2 PVR = 1.7 WU Ao sat = 94% PA sat = 61%,64% PAPi = 2.4  Interval hx:  Today presents for regular follow-up.  Overall he has been doing fairly well.  Managing his driveline well with no recent infections.  No lightheadedness, chest pain, palpitations.  Has made significant progress since HeartMate implant.  Unfortunately continues to struggle with hypervolemia or hypovolemia.  Today he presents with at least a 10 pound weight gain, 2+ lower extremity edema and slightly worse dyspnea.  Reports compliance with all medications.   VAD Indication: Destination therapy - Seen by [facility] for transplant. Transplant team advised for VAD. Once pt lost 25lbs they would reconsider for transplant.   LVAD assessment: HM III : Speed: 5800  Flow: 5.2 Power: 4.6 PI: 3.5 Alarms: none Events: Rare PI events  Fixed speed: 5800 Low speed limit: 5500   Primary controller: back up battery due for replacement in 24 months Secondary controller:  back up battery due for replacement in 26 months   I reviewed the LVAD parameters from today and compared the results to the patient's prior recorded data. LVAD interrogation was NEGATIVE for significant power changes, NEGATIVE for clinical alarms and STABLE for PI events/speed drops. No programming changes were made and pump is functioning within specified parameters. Pt is performing daily controller and system monitor self tests along with completing weekly and monthly maintenance for LVAD equipment.    LVAD personally interrogated.  Parameters stable.  Reviewed with LVAD coordinator.   Past Medical History:  Diagnosis Date   Abnormal EKG    Acute systolic heart failure (HCC) 09/06/2017   Angina pectoris 09/05/2017   Body mass index 45.0-49.9, adult (HCC)    CAD S/P percutaneous coronary angioplasty 09/07/2017   mLAD and dLAD PCI with DES 09/06/17   CHF (congestive heart failure) (HCC)    Chronic systolic (congestive) heart failure (HCC) 03/19/2019   Complication of anesthesia 1995   had hard time waking up; breathing w/vasectomy   Coronary artery disease    Erectile dysfunction    Essential hypertension, benign    Fatigue    GERD (gastroesophageal reflux disease)    Gout    High cholesterol    just started tx yesterday (09/06/2017)   History of gout    RX prn (09/06/2017)   Ischemic cardiomyopathy 09/07/2017   EF 25-35%   Muscular chest pain    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   Below Left Nare (MOH's)   Obesity    Obstructive sleep apnea    OSA on CPAP    Plantar fasciitis    Pre-operative clearance 02/11/2018   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/02/2019   Saint Jude   Right bundle branch block    Shortness of breath 09/05/2017   Testosterone deficiency    Type 2 diabetes mellitus with complication, without long-term current use of insulin  (HCC) 09/05/2017   Type II diabetes mellitus (HCC)  started tx 08/28/2017    Current Outpatient Medications  Medication Sig Dispense Refill   acetaminophen  (TYLENOL ) 500 MG tablet Take 1,000 mg by mouth every 6 (six) hours as needed for moderate pain or headache.     allopurinol  (ZYLOPRIM ) 100 MG tablet Take 100 mg by mouth in the morning.     amiodarone  (PACERONE ) 200 MG tablet Take 1 tablet (200 mg total) by mouth daily. 90 tablet 3   colchicine 0.6 MG tablet Take 0.6 mg by mouth daily as needed (Gout).     diclofenac  Sodium (VOLTAREN ) 1 % GEL APPLY 1 G TOPICALLY DAILY. 100 g 0    digoxin  (LANOXIN ) 0.125 MG tablet Take 1 tablet (0.125 mg total) by mouth daily. 90 tablet 3   empagliflozin  (JARDIANCE ) 10 MG TABS tablet Take 10 mg by mouth daily.     escitalopram  (LEXAPRO ) 10 MG tablet Take 10 mg by mouth at bedtime.     ezetimibe  (ZETIA ) 10 MG tablet Take 1 tablet (10 mg total) by mouth daily. 90 tablet 3   gabapentin  (NEURONTIN ) 300 MG capsule Take 300 mg by mouth 3 (three) times daily.     levothyroxine  (SYNTHROID ) 25 MCG tablet Take 25 mcg by mouth daily before breakfast.     metFORMIN  (GLUCOPHAGE -XR) 500 MG 24 hr tablet Take 1,000 mg by mouth in the morning and at bedtime.     mexiletine (MEXITIL ) 250 MG capsule Take 1 capsule (250 mg total) by mouth 2 (two) times daily. 180 capsule 3   multivitamin (ONE-A-DAY MEN'S) TABS tablet Take 1 tablet by mouth in the morning.     omeprazole  (PRILOSEC) 40 MG capsule Take 1 capsule (40 mg total) by mouth daily. 90 capsule 1   ondansetron  (ZOFRAN -ODT) 4 MG disintegrating tablet Take 1 tablet (4 mg total) by mouth every 8 (eight) hours as needed for nausea or vomiting. 20 tablet 0   Polyethylene Glycol 400 (BLINK TEARS OP) Place 2 drops into both eyes 4 (four) times daily as needed (for dry eyes).     potassium chloride  SA (KLOR-CON  M) 20 MEQ tablet Take 3 tablets (60 mEq total) by mouth 2 (two) times daily. 540 tablet 3   ranolazine  (RANEXA ) 500 MG 12 hr tablet Take 1 tablet (500 mg total) by mouth daily. 90 tablet 3   rosuvastatin  (CRESTOR ) 40 MG tablet Take 1 tablet (40 mg total) by mouth daily. 90 tablet 2   sildenafil  (REVATIO ) 20 MG tablet Take 1 tablet (20 mg total) by mouth 3 (three) times daily. 90 tablet 5   sildenafil  (VIAGRA ) 50 MG tablet Take 1 tablet (50 mg total) by mouth daily as needed for erectile dysfunction. 10 tablet 6   spironolactone  (ALDACTONE ) 25 MG tablet Take 1 tablet (25 mg total) by mouth daily. 90 tablet 3   tirzepatide (MOUNJARO) 7.5 MG/0.5ML Pen Inject 7.5 mg into the skin once a week.     warfarin  (COUMADIN ) 5 MG tablet Take 1.5 tablets (7.5 mg total) by mouth daily. 60 tablet 5   torsemide  (DEMADEX ) 20 MG tablet Take 3 tablets (60 mg total) by mouth daily. 180 tablet 2   No current facility-administered medications for this encounter.      Vitals:   03/14/24 1454 03/14/24 1455  BP: 97/83 (!) 88/0  Pulse: 67   SpO2: 95%   Weight: (!) 154.2 kg (340 lb)   Height: 6' 3 (1.905 m)       Vital Signs:  Doppler Pressure: 88 Automatic BP: 97/83 (89) HR:  67 NSR SPO2: 95%   Weight: 340 lb w/ eqt Last weight: 340.6 lb w/ eqt BMI today 43 today   Physical Exam: Vitals:   03/14/24 1454 03/14/24 1455  BP: 97/83 (!) 88/0  Pulse: 67   SpO2: 95%    GENERAL: NAD Lungs- normal work of breathing CARDIAC:  JVP: 12 cm          Normal LVAD hum ABDOMEN: Soft, non-tender, non-distended.  EXTREMITIES: Warm and well perfused.  NEUROLOGIC: No obvious FND    ASSESSMENT AND PLAN:  1. Chronic Systolic HF s/p HM3 Implantation 11/14 - due to iCM - Echo 05/01/23: EF 20%, RV moderately down (improved) - RHC (9/24): elevated biventricular filling pressures with low PAPi; CI 2.2 - S/p HM III LVAD 05/03/23 by Dr. Obadiah - RAMP echo 11/21: Speeds left at 6000.  Speed increased to 6100 on 11/27. Speed increased to 6200 on 05/18/23.  - Continue sildenafil  and digoxin  for RV failure.  - Had been struggling with RV failure. VAD speed turned down to 5800. RHC 2/25 showed well compensated hemodynamics with dry weight 307-309  - Continue spiro 25 - Has been seen at [facility] and will consider for transplant once BMI down but he is struggling to lose weight. Body mass index is 42.5 kg/m. - Today presents with volume overload with 2+ pitting edema and JVP up to 10 to 12 cm.  In addition he reports slight decline in functional capacity.  He had a right heart cath in 08/06/2023, at that time dry weight was deemed to be 307 at 309 pounds.  Today he weighs 340 pounds.  I do not believe this is all from  fluid but rather weight gain from excess calories and some degree of hypervolemia.  Due to his persistent struggles with hypo or hypervolemia we will proceed with CardioMEMS placement.  Discussed extensively with patient's discussed risks and benefits.  Will schedule CardioMEMS for Dr. Bensimhon. - Increase torsemide  to 80 mg daily x 3 days followed by 60 mg daily.  Follow-up next week to reassess  2 DL site irritation (possible early infection) - Looks improved.  Status post doxycycline  100 mg twice daily for 7 days  3. LVAD management - s/p HM-3 implant 05/03/23 - Denies fevers or chills. No bleeding, neuro sx or problems with driveline.  - INR 1.4, goal 2-3.  Discussed with dad Pharm.D. - LDH stable at 164 - MAP mildly elevated.  Will continue to monitor.  Plan for aggressive diuresis   4. H/o VT Arrest and Hx PVCs - had VT arrest x 2 in 10/24 - Continue amiodarone  100 mg daily and Mexiletine 250 mg bid.    5. CAD with chronic stable angina - s/p multiple stents to LAD and LCX - LHC (9/24) with stable 3v CAD - No s/s angina  6. DM2 - A1c 7.5 (10/24) - Per PCP   7. OSA - Not using CPAP, says machine was recalled a while ago. - Need to get new device   8. Morbid obesity - Continues to gain weight despite Ozempic - Body mass index is 42.5 kg/m. - Understands need to get BMI down near 35 for transplant - Recently switched to Mounjaro  - Schedule cardiomems. Extensively discussed risks/benefits.   Estelita Iten, DO  4:22 PM

## 2024-03-14 NOTE — Patient Instructions (Addendum)
 Increase Torsemide  to 80 mg for 3 days and then resume a dose of 60 mg daily Take an extra 20 mEq of potassium for the 3 days of extra Torsemide  We have obtained xrays today of your shoulder, knee and chest.  We will schedule a RHC w/cardiomems placement Return to clinic in 1 wk

## 2024-03-17 ENCOUNTER — Telehealth (HOSPITAL_COMMUNITY): Payer: Self-pay

## 2024-03-17 ENCOUNTER — Encounter (HOSPITAL_COMMUNITY)

## 2024-03-17 NOTE — Telephone Encounter (Signed)
 11:50am: Imaging results from 03/14/24 reviewed with Dr. Cherrie this morning showing a clavicle fracture. Referral placed to [facility] and imaging routed to on call provider. Pt made aware that recommendations will be given once provider reviews imaging.  3:39: On call provider Dr. Ernie called VAD Clinic. Recommendations given for patient to get an over the counter arm sling and schedule follow up with Marcey Her at [facility] this week or next. VM left for pt with detailed instructions and contact information for scheduling.   Schuyler Lunger RN, BSN VAD Coordinator 24/7 Pager [phone]

## 2024-03-18 ENCOUNTER — Other Ambulatory Visit (HOSPITAL_COMMUNITY): Payer: Self-pay | Admitting: *Deleted

## 2024-03-18 ENCOUNTER — Ambulatory Visit (HOSPITAL_COMMUNITY): Payer: Self-pay | Admitting: Pharmacist

## 2024-03-18 DIAGNOSIS — I5022 Chronic systolic (congestive) heart failure: Secondary | ICD-10-CM

## 2024-03-18 DIAGNOSIS — Z5181 Encounter for therapeutic drug level monitoring: Secondary | ICD-10-CM

## 2024-03-18 DIAGNOSIS — Z7901 Long term (current) use of anticoagulants: Secondary | ICD-10-CM

## 2024-03-18 DIAGNOSIS — Z95811 Presence of heart assist device: Secondary | ICD-10-CM

## 2024-03-18 LAB — POCT INR: INR: 1.4 — AB (ref 2.0–3.0)

## 2024-03-18 NOTE — Progress Notes (Signed)
 LVAD CLINIC NOTE  PCP: Primary Cardiologist: Lamar Fitch, MD HF MD: DB   Chief complaint: HF  HPI:  Joe [name] is a 53 y.o. male with h/o morbid obesity, DM2, HTN, HL, OSA, CAD s/p multiple stents to LAD and LCX  and systolic HF s/p Abbott CRT-D. Now s/p HM3 LVAD Implantation 11/14 by Dr. Fleeta Ochoa.  Underwent RHC and Ramp Echo 08/06/23  RHC 08/14/23  RA = 5 RV = 27/5 PA = 21/9 (14) PCW = 4 Fick cardiac output/index = 5.9/2.2 TD CO/CI = 5.8/2.2 PVR = 1.7 WU Ao sat = 94% PA sat = 61%,64% PAPi = 2.4  Interval hx:  Today presents for regular follow-up.  Overall he has been doing fairly well.  Managing his driveline well with no recent infections.  No lightheadedness, chest pain, palpitations.  Has made significant progress since HeartMate implant.  Unfortunately continues to struggle with managing his volume status.  Today he presents with at least a 10 pound weight gain, 2+ lower extremity edema and slightly worse dyspnea.  Reports compliance with all medications. Has not followed diet closely, has a tendency to stress eat.    VAD Indication: Destination therapy - Seen by [facility] for transplant. Transplant team advised for VAD. Once pt lost 25lbs they would reconsider for transplant.   LVAD Documentation    03/19/2024  Device Info  LVAD Type: Heartmate III  Date of Implant: 05/14/2023  Therapy Type: Destination Therapy      03/19/2024  Vitals  Heart Rate: 67 BPM  Automatic BP: 97/68  Doppler MAP: 104 mmHg  SpO2: 96 %    Last 3 Weights Weight Weight  03/14/2024 154.223 kg 340 lb  01/11/2024 154.495 kg 340 lb 9.6 oz  11/08/2023 151.501 kg 334 lb       03/19/2024  LVAD Paramaters  Speed: 5800 RPM  Flow: 5 LPM  PI: 5  Power: 5 Watts  Hematocrit: 36 %  Alarms: Few Low Voltage  Events: rare  Last Speed Change Date: 05/14/2023  Last Ramp Echo Date: 05/14/2023  Last Right Heart Cath Date: 08/06/2023  Bleeding History: No  Type of Dressing: Weekly  Annual  Maintenance Date: 05/14/2023    Labs    Units 03/26/24 0000 03/19/24 0918 03/18/24 0000 03/14/24 1346 01/15/24 0000 01/11/24 1552 01/11/24 1045 10/29/23 0000 10/22/23 1106  INR  2.8 1.4* 1.4* 1.4*   < >  --  1.4*   < > 2.0*  LDH U/L  --  166  --  164  --   --   --   --  173  HGB g/dL  --  87.8*  --  88.1*  --   --  12.2*  --  12.1*  CREATININE mg/dL  --  8.84  --  8.61*  --  1.22  --   --  1.34*   < > = values in this interval not displayed.         Past Medical History:  Diagnosis Date   Abnormal EKG    Acute systolic heart failure (HCC) 09/06/2017   Angina pectoris 09/05/2017   Body mass index 45.0-49.9, adult (HCC)    CAD S/P percutaneous coronary angioplasty 09/07/2017   mLAD and dLAD PCI with DES 09/06/17   CHF (congestive heart failure) (HCC)    Chronic systolic (congestive) heart failure (HCC) 03/19/2019   Complication of anesthesia 1995   had hard time waking up; breathing w/vasectomy   Coronary artery disease    Erectile dysfunction  Essential hypertension, benign    Fatigue    GERD (gastroesophageal reflux disease)    Gout    High cholesterol    just started tx yesterday (09/06/2017)   History of gout    RX prn (09/06/2017)   Ischemic cardiomyopathy 09/07/2017   EF 25-35%   Muscular chest pain    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   Below Left Nare (MOH's)   Obesity    Obstructive sleep apnea    OSA on CPAP    Plantar fasciitis    Pre-operative clearance 02/11/2018   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/02/2019   Saint Jude   Right bundle branch block    Shortness of breath 09/05/2017   Testosterone deficiency    Type 2 diabetes mellitus with complication, without long-term current use of insulin  (HCC) 09/05/2017   Type II diabetes mellitus (HCC)    started tx 08/28/2017    Current Outpatient Medications  Medication Sig Dispense Refill   acetaminophen  (TYLENOL ) 500 MG tablet Take 1,000 mg by mouth  every 6 (six) hours as needed for moderate pain or headache.     allopurinol  (ZYLOPRIM ) 100 MG tablet Take 100 mg by mouth in the morning.     amiodarone  (PACERONE ) 200 MG tablet Take 1 tablet (200 mg total) by mouth daily. 90 tablet 3   colchicine 0.6 MG tablet Take 0.6 mg by mouth daily as needed (Gout).     diclofenac  Sodium (VOLTAREN ) 1 % GEL APPLY 1 G TOPICALLY DAILY. (Patient taking differently: Apply 2 g topically daily as needed (pain).) 100 g 0   digoxin  (LANOXIN ) 0.125 MG tablet Take 1 tablet (0.125 mg total) by mouth daily. 90 tablet 3   empagliflozin  (JARDIANCE ) 25 MG TABS tablet Take 25 mg by mouth daily.     escitalopram  (LEXAPRO ) 10 MG tablet Take 10 mg by mouth at bedtime.     ezetimibe  (ZETIA ) 10 MG tablet Take 1 tablet (10 mg total) by mouth daily. 90 tablet 3   gabapentin  (NEURONTIN ) 300 MG capsule Take 300 mg by mouth 3 (three) times daily.     insulin  degludec (TRESIBA) 100 UNIT/ML FlexTouch Pen Inject 20 Units into the skin daily.     levothyroxine  (SYNTHROID ) 25 MCG tablet Take 25 mcg by mouth daily before breakfast.     metFORMIN  (GLUCOPHAGE -XR) 500 MG 24 hr tablet Take 1,000 mg by mouth in the morning and at bedtime.     mexiletine (MEXITIL ) 250 MG capsule Take 1 capsule (250 mg total) by mouth 2 (two) times daily. 180 capsule 3   multivitamin (ONE-A-DAY MEN'S) TABS tablet Take 1 tablet by mouth in the morning.     omeprazole  (PRILOSEC) 40 MG capsule Take 1 capsule (40 mg total) by mouth daily. 90 capsule 1   ondansetron  (ZOFRAN -ODT) 4 MG disintegrating tablet Take 1 tablet (4 mg total) by mouth every 8 (eight) hours as needed for nausea or vomiting. 20 tablet 0   Polyethylene Glycol 400 (BLINK TEARS OP) Place 2 drops into both eyes 4 (four) times daily as needed (for dry eyes).     potassium chloride  SA (KLOR-CON  M) 20 MEQ tablet Take 3 tablets (60 mEq total) by mouth 2 (two) times daily. (Patient taking differently: Take 80 mEq by mouth 2 (two) times daily.) 540 tablet 3    ranolazine  (RANEXA ) 500 MG 12 hr tablet Take 1 tablet (500 mg total) by mouth daily. 90 tablet 3   rosuvastatin  (CRESTOR ) 40 MG tablet Take 1  tablet (40 mg total) by mouth daily. 90 tablet 2   sildenafil  (REVATIO ) 20 MG tablet Take 1 tablet (20 mg total) by mouth 3 (three) times daily. 90 tablet 5   sildenafil  (VIAGRA ) 50 MG tablet Take 1 tablet (50 mg total) by mouth daily as needed for erectile dysfunction. 10 tablet 6   spironolactone  (ALDACTONE ) 25 MG tablet Take 1 tablet (25 mg total) by mouth daily. 90 tablet 3   tirzepatide (MOUNJARO) 7.5 MG/0.5ML Pen Inject 7.5 mg into the skin once a week. (Patient taking differently: Inject 10 mg into the skin once a week.)     torsemide  (DEMADEX ) 20 MG tablet Take 3 tablets (60 mg total) by mouth daily. 180 tablet 2   warfarin (COUMADIN ) 5 MG tablet Take 1.5 tablets (7.5 mg total) by mouth daily. (Patient taking differently: Take 7.5 mg by mouth at bedtime.) 60 tablet 5   No current facility-administered medications for this encounter.      There were no vitals filed for this visit.     Vital Signs:  Doppler Pressure: 88 Automatic BP: 97/83 (89) HR: 67 NSR SPO2: 95%   Weight: 340 lb w/ eqt Last weight: 340.6 lb w/ eqt BMI today 43 today   Physical Exam: General:  NAD.  HEENT: normal  Neck: supple. JVP nto jaw.  Carotids 2+ bilat; no bruits. No lymphadenopathy or thryomegaly appreciated. Cor: LVAD hum.  Lungs: Clear. Abdomen: obese soft, nontender, non-distended. No hepatosplenomegaly. No bruits or masses. Good bowel sounds. Driveline site clean. Anchor in place.  Extremities: no cyanosis, clubbing, rash. Warm tr-1+ edema  Neuro: alert & oriented x 3. No focal deficits. Moves all 4 without problem     ASSESSMENT AND PLAN:  1. Chronic Systolic HF s/p HM3 Implantation 11/14 - due to iCM - Echo 05/01/23: EF 20%, RV moderately down (improved) - RHC (9/24): elevated biventricular filling pressures with low PAPi; CI 2.2 - S/p HM  III LVAD 05/03/23 by Dr. Obadiah - RAMP echo 11/21: Speeds left at 6000.  Speed increased to 6100 on 11/27. Speed increased to 6200 on 05/18/23.  - Continue sildenafil  and digoxin  for RV failure.  - Had been struggling with RV failure. VAD speed turned down to 5800. RHC 2/25 showed well compensated hemodynamics with dry weight 307-309  - NYHA II.  - Continues to struggle with his volume status likely due to a combination of RV failure and dietary inconsistency. We discussed CardioMems sensor and he is interested and would like to proceed - Increase torsemide  to 80 mg daily x 3 days followed by 60 mg daily.  Follow-up next week to reassess - Continue spiro 25 - Has been seen at [facility] and will consider for transplant once BMI down but he is struggling to lose weight. There is no height or weight on file to calculate BMI.  2 DL site irritation (possible early infection) - Status post doxycycline  100 mg twice daily for 7 days. Now improved  3. LVAD management - s/p HM-3 implant 05/03/23 - Denies fevers or chills. No bleeding, neuro sx or problems with driveline.  - INR 1.4, goal 2-3. Discussed warfarin dosing with PharmD personally. - LDH 164 - MAP mildly elevated.  Will continue to monitor.  May improve with diuresis. Adjust anti-HTN meds as needed   4. H/o VT Arrest and Hx PVCs - had VT arrest x 2 in 10/24 - Continue amiodarone  100 mg daily and Mexiletine 250 mg bid.  - Consider wean of amio  5. CAD with chronic stable angina - s/p multiple stents to LAD and LCX - LHC (9/24) with stable 3v CAD - No s/s angina  6. DM2 - A1c 7.5 (10/24) - Per PCP   7. OSA - Not using CPAP, says machine was recalled a while ago. - Need to get new device   8. Morbid obesity - Continues to gain weight - Understands need to get BMI down near 35 for transplant. We discussed again today - Recently switched to Mounjaro  I spent a total of 48 minutes today: 1) reviewing the patient's medical records  including previous charts, labs and recent notes from other providers; 2) examining the patient and counseling them on their medical issues/explaining the plan of care; 3) adjusting meds as needed and 4) ordering lab work or other needed tests.    Toribio Fuel, MD  9:49 PM

## 2024-03-19 ENCOUNTER — Ambulatory Visit (HOSPITAL_COMMUNITY)
Admission: RE | Admit: 2024-03-19 | Discharge: 2024-03-19 | Disposition: A | Discharge status: Home / Self Care | Source: Ambulatory Visit | Attending: Cardiology | Admitting: Cardiology

## 2024-03-19 ENCOUNTER — Other Ambulatory Visit (HOSPITAL_COMMUNITY): Payer: Self-pay

## 2024-03-19 ENCOUNTER — Ambulatory Visit (HOSPITAL_COMMUNITY): Payer: Self-pay | Admitting: Licensed Clinical Social Worker

## 2024-03-19 ENCOUNTER — Ambulatory Visit (HOSPITAL_COMMUNITY): Payer: Self-pay | Admitting: Pharmacist

## 2024-03-19 DIAGNOSIS — Z4509 Encounter for adjustment and management of other cardiac device: Secondary | ICD-10-CM | POA: Diagnosis not present

## 2024-03-19 DIAGNOSIS — Z79899 Other long term (current) drug therapy: Secondary | ICD-10-CM | POA: Insufficient documentation

## 2024-03-19 DIAGNOSIS — R0602 Shortness of breath: Secondary | ICD-10-CM | POA: Diagnosis present

## 2024-03-19 DIAGNOSIS — I5022 Chronic systolic (congestive) heart failure: Secondary | ICD-10-CM

## 2024-03-19 DIAGNOSIS — Z95811 Presence of heart assist device: Secondary | ICD-10-CM | POA: Diagnosis not present

## 2024-03-19 DIAGNOSIS — Z7901 Long term (current) use of anticoagulants: Secondary | ICD-10-CM

## 2024-03-19 DIAGNOSIS — Z5181 Encounter for therapeutic drug level monitoring: Secondary | ICD-10-CM

## 2024-03-19 LAB — DIGOXIN LEVEL: Digoxin Level: <0.6 ng/mL — ABNORMAL LOW (ref 0.8–2.0)

## 2024-03-19 LAB — BASIC METABOLIC PANEL WITH GFR
Anion gap: 8 (ref 5–15)
BUN: 12 mg/dL (ref 6–20)
CO2: 25 mmol/L (ref 22–32)
Calcium: 8.6 mg/dL — ABNORMAL LOW (ref 8.9–10.3)
Chloride: 104 mmol/L (ref 98–111)
Creatinine, Ser: 1.15 mg/dL (ref 0.61–1.24)
GFR, Estimated: >60 mL/min (ref >60)
Glucose, Bld: 323 mg/dL — ABNORMAL HIGH (ref 70–99)
Potassium: 4.6 mmol/L (ref 3.5–5.1)
Sodium: 137 mmol/L (ref 135–145)

## 2024-03-19 LAB — CBC
HCT: 37.8 % — ABNORMAL LOW (ref 39.0–52.0)
Hemoglobin: 12.1 g/dL — ABNORMAL LOW (ref 13.0–17.0)
MCH: 30 pg (ref 26.0–34.0)
MCHC: 32 g/dL (ref 30.0–36.0)
MCV: 93.6 fL (ref 80.0–100.0)
Platelets: 174 K/uL (ref 150–400)
RBC: 4.04 MIL/uL — ABNORMAL LOW (ref 4.22–5.81)
RDW: 15.5 % (ref 11.5–15.5)
WBC: 6.1 K/uL (ref 4.0–10.5)
nRBC: 0 % (ref 0.0–0.2)

## 2024-03-19 LAB — PROTIME-INR
INR: 1.4 — ABNORMAL HIGH (ref 0.8–1.2)
Prothrombin Time: 18.2 s — ABNORMAL HIGH (ref 11.4–15.2)

## 2024-03-19 LAB — LACTATE DEHYDROGENASE: LDH: 166 U/L (ref 98–192)

## 2024-03-19 NOTE — Progress Notes (Signed)
 Pt presents for 1 week f/u in VAD Clinic today with his wife. Reports no problems with VAD equipment or concerns with drive line.  Pt walked into VAD Clinic unassisted. Seen in VAD Clinic last week reporting weight gain and increased shortness of breath. Advised to increase Torsemide  to 80 mg for 3 days and then resume a dose of 60 mg daily. Pt reports mild improvements in shortness of breath for the 3 days he increased his Torsemide  but now shortness of breath has resumed. Pt's weight continues to be up. 338.8 lbs today.   Discussion about RHC with Cardiomems placement in the near future for tighter control on volume status held with Dr.Sabharwal last visit. Discussed again with Dr.Bensimhon today. Pt would like to proceed with RHC and Cardiomems placement. Insurance approval obtained. Will schedule in the next 1-2 weeks with Dr. Bensimhon.  Xrays obtained after recent fall showing displaced clavicle. Reviewed with Dr.Bensimhon on Monday. Imaging sent to Orthopedic Surgeon Dr.Olin. Recommendations made for pt to obtain an OTC arm sling and schedule follow up with Dr. Elspeth Her at [facility]. Pt not wearing arm sling in clinic today he states he has one somewhere at home. Appointment requested with Dr. Her pt awaiting returned call for scheduling.  Vital Signs:  Doppler Pressure: 104 Automatic BP: 115/86 (94) HR: 67 NSR SPO2: 96%   Weight: 338.8 lb w/ eqt Last weight: 340 lb w/ eqt BMI today 43 today   VAD Indication: Destination therapy - Seen by [facility] for transplant. Transplant team advised for VAD. Once pt lost 25lbs they would reconsider for transplant.   LVAD assessment: HM III : Speed: 5800 rpms Flow: 5.3 Power: 4.7 w    PI: 3.3 Alarms: couple LV  Events: rare PI events  Fixed speed: 5800 Low speed limit: 5500   Primary controller: back up battery due for replacement in 20 months Secondary controller:  back up battery due for replacement in 22 months   I reviewed the  LVAD parameters from today and compared the results to the patient's prior recorded data. LVAD interrogation was NEGATIVE for significant power changes, NEGATIVE for clinical alarms and STABLE for PI events/speed drops. No programming changes were made and pump is functioning within specified parameters. Pt is performing daily controller and system monitor self tests along with completing weekly and monthly maintenance for LVAD equipment.   LVAD equipment check completed and is in good working order. Back-up equipment present. Charged back up battery and performed self-test on equipment.    Annual Equipment Maintenance on UBC/PM was performed on 05/03/2023.    Exit Site Care: Dressing change performed weekly on Fridays using daily kit by pt's wife Lenore at home. Existing VAD dressing CDI. Anchor secure. No reports of redness, tenderness or drainage at exit site. Previously reported bruising has resolved.   Significant Events on VAD Support:    Device: St Jude Therapies: ON VF 200 Last check: 10/18/22   BP & Labs:  MAP 104 - Doppler is reflecting modified systolic  Hgb 12.1 - No S/S of bleeding. Specifically denies melena/BRBPR or nosebleeds.   LDH stable at 166 with established baseline of 190- 245. Denies tea-colored urine. No power elevations noted on interrogation.   Plan: Continue 60mg  of Torsemide  daily Will call with scheduling for RHC and Cardiomems placement Coumadin  dosing per [facility]  Schuyler Lunger RN, BSN VAD Coordinator 24/7 Pager [phone]

## 2024-03-19 NOTE — Progress Notes (Signed)
 Patient called to inform of procedure details. Right Heart Catheterization with Cardiomems placement scheduled 10/8 at 1000 with Dr.Bensimhon. Place to arrive at [facility] A and check in at admitting at 0800. Nothing to eat or drink past midnight the night prior to the procedure.    Chantel CMA made aware of scheduled procedure and reached out to Cardiomems reps to notify them of the case.   Schuyler Lunger RN, BSN VAD Coordinator 24/7 Pager [phone]

## 2024-03-20 NOTE — Progress Notes (Signed)
 Remote ICD Transmission

## 2024-03-25 ENCOUNTER — Telehealth (HOSPITAL_COMMUNITY): Payer: Self-pay

## 2024-03-25 NOTE — Telephone Encounter (Signed)
 RHC with Cardiomems placement cancelled for tomorrow due to prior authorization request still pending. Pt recently switched insurance to [facility] Advantage effective 03/19/24. Will reschedule when procedure is approved. Pt scheduled for 3 week f/u with Dr. Cherrie in VAD Clinic.   Schuyler Lunger RN, BSN VAD Coordinator 24/7 Pager [phone]

## 2024-03-26 ENCOUNTER — Ambulatory Visit (HOSPITAL_COMMUNITY): Admission: RE | Admit: 2024-03-26 | Source: Home / Self Care | Admitting: Internal Medicine

## 2024-03-26 ENCOUNTER — Encounter (HOSPITAL_COMMUNITY): Admission: RE | Payer: Self-pay | Source: Home / Self Care

## 2024-03-26 ENCOUNTER — Ambulatory Visit (HOSPITAL_COMMUNITY): Payer: Self-pay | Admitting: Pharmacist

## 2024-03-26 LAB — POCT INR: INR: 2.8 (ref 2.0–3.0)

## 2024-03-26 SURGERY — PRESSURE SENSOR/CARDIOMEMS
Anesthesia: LOCAL

## 2024-03-28 DIAGNOSIS — S42032A Displaced fracture of lateral end of left clavicle, initial encounter for closed fracture: Secondary | ICD-10-CM | POA: Diagnosis not present

## 2024-03-28 DIAGNOSIS — M7502 Adhesive capsulitis of left shoulder: Secondary | ICD-10-CM | POA: Diagnosis not present

## 2024-03-28 DIAGNOSIS — M898X1 Other specified disorders of bone, shoulder: Secondary | ICD-10-CM | POA: Diagnosis not present

## 2024-04-02 LAB — POCT INR: INR: 1.6 — AB (ref 2.0–3.0)

## 2024-04-03 ENCOUNTER — Ambulatory Visit (HOSPITAL_COMMUNITY): Payer: Self-pay | Admitting: Pharmacist

## 2024-04-10 ENCOUNTER — Ambulatory Visit (HOSPITAL_COMMUNITY): Payer: Self-pay | Admitting: Pharmacist

## 2024-04-10 LAB — POCT INR: INR: 2.1 (ref 2.0–3.0)

## 2024-04-14 ENCOUNTER — Ambulatory Visit (INDEPENDENT_AMBULATORY_CARE_PROVIDER_SITE_OTHER): Payer: BC Managed Care – PPO

## 2024-04-14 DIAGNOSIS — I493 Ventricular premature depolarization: Secondary | ICD-10-CM | POA: Diagnosis not present

## 2024-04-15 ENCOUNTER — Ambulatory Visit: Payer: Self-pay | Admitting: Cardiology

## 2024-04-15 LAB — CUP PACEART REMOTE DEVICE CHECK
Battery Remaining Longevity: 24 mo
Battery Remaining Percentage: 28 %
Battery Voltage: 2.92 V
Brady Statistic AP VP Percent: 3.7 %
Brady Statistic AP VS Percent: 1 %
Brady Statistic AS VP Percent: 96 %
Brady Statistic AS VS Percent: 1 %
Brady Statistic RA Percent Paced: 3.6 %
Date Time Interrogation Session: 20251027065323
HighPow Impedance: 82 Ohm
HighPow Impedance: 82 Ohm
Implantable Lead Connection Status: 753985
Implantable Lead Connection Status: 753985
Implantable Lead Connection Status: 753985
Implantable Lead Implant Date: 20200930
Implantable Lead Implant Date: 20200930
Implantable Lead Implant Date: 20200930
Implantable Lead Location: 753858
Implantable Lead Location: 753859
Implantable Lead Location: 753860
Implantable Pulse Generator Implant Date: 20200930
Lead Channel Impedance Value: 1075 Ohm
Lead Channel Impedance Value: 380 Ohm
Lead Channel Impedance Value: 510 Ohm
Lead Channel Pacing Threshold Amplitude: 0.75 V
Lead Channel Pacing Threshold Amplitude: 1 V
Lead Channel Pacing Threshold Amplitude: 1.25 V
Lead Channel Pacing Threshold Pulse Width: 0.5 ms
Lead Channel Pacing Threshold Pulse Width: 0.5 ms
Lead Channel Pacing Threshold Pulse Width: 0.5 ms
Lead Channel Sensing Intrinsic Amplitude: 1.2 mV
Lead Channel Sensing Intrinsic Amplitude: 11.7 mV
Lead Channel Setting Pacing Amplitude: 2 V
Lead Channel Setting Pacing Amplitude: 2.5 V
Lead Channel Setting Pacing Amplitude: 2.5 V
Lead Channel Setting Pacing Pulse Width: 0.5 ms
Lead Channel Setting Pacing Pulse Width: 0.5 ms
Lead Channel Setting Sensing Sensitivity: 0.5 mV
Pulse Gen Serial Number: 9901124
Zone Setting Status: 755011

## 2024-04-18 ENCOUNTER — Telehealth (HOSPITAL_COMMUNITY): Payer: Self-pay | Admitting: Pharmacist

## 2024-04-18 NOTE — Telephone Encounter (Signed)
 Attempted to call patient for INR check reminder with no response. LVM.

## 2024-04-22 NOTE — Progress Notes (Signed)
 Remote ICD Transmission

## 2024-04-23 ENCOUNTER — Ambulatory Visit (HOSPITAL_COMMUNITY): Payer: Self-pay | Admitting: Pharmacist

## 2024-04-23 LAB — POCT INR: INR: 2 (ref 2.0–3.0)

## 2024-04-28 ENCOUNTER — Encounter (HOSPITAL_COMMUNITY): Admitting: Internal Medicine

## 2024-04-29 ENCOUNTER — Other Ambulatory Visit (HOSPITAL_COMMUNITY): Payer: Self-pay

## 2024-04-29 DIAGNOSIS — Z7901 Long term (current) use of anticoagulants: Secondary | ICD-10-CM

## 2024-04-29 DIAGNOSIS — Z95811 Presence of heart assist device: Secondary | ICD-10-CM

## 2024-04-30 ENCOUNTER — Ambulatory Visit (HOSPITAL_COMMUNITY)
Admission: RE | Admit: 2024-04-30 | Discharge: 2024-04-30 | Disposition: A | Discharge status: Home / Self Care | Source: Ambulatory Visit | Attending: Cardiology | Admitting: Cardiology

## 2024-04-30 ENCOUNTER — Ambulatory Visit (HOSPITAL_COMMUNITY): Payer: Self-pay | Admitting: Pharmacist

## 2024-04-30 VITALS — BP 106/0 | HR 62 | Wt 340.6 lb

## 2024-04-30 DIAGNOSIS — Z95811 Presence of heart assist device: Secondary | ICD-10-CM | POA: Diagnosis not present

## 2024-04-30 DIAGNOSIS — Z955 Presence of coronary angioplasty implant and graft: Secondary | ICD-10-CM | POA: Insufficient documentation

## 2024-04-30 DIAGNOSIS — Z794 Long term (current) use of insulin: Secondary | ICD-10-CM | POA: Insufficient documentation

## 2024-04-30 DIAGNOSIS — Z7901 Long term (current) use of anticoagulants: Secondary | ICD-10-CM | POA: Diagnosis not present

## 2024-04-30 DIAGNOSIS — I25118 Atherosclerotic heart disease of native coronary artery with other forms of angina pectoris: Secondary | ICD-10-CM | POA: Insufficient documentation

## 2024-04-30 DIAGNOSIS — I110 Hypertensive heart disease with heart failure: Secondary | ICD-10-CM | POA: Insufficient documentation

## 2024-04-30 DIAGNOSIS — I5022 Chronic systolic (congestive) heart failure: Secondary | ICD-10-CM | POA: Diagnosis not present

## 2024-04-30 DIAGNOSIS — Z8674 Personal history of sudden cardiac arrest: Secondary | ICD-10-CM | POA: Insufficient documentation

## 2024-04-30 DIAGNOSIS — Z7984 Long term (current) use of oral hypoglycemic drugs: Secondary | ICD-10-CM | POA: Diagnosis not present

## 2024-04-30 DIAGNOSIS — I50810 Right heart failure, unspecified: Secondary | ICD-10-CM

## 2024-04-30 DIAGNOSIS — I2510 Atherosclerotic heart disease of native coronary artery without angina pectoris: Secondary | ICD-10-CM

## 2024-04-30 DIAGNOSIS — Z7985 Long-term (current) use of injectable non-insulin antidiabetic drugs: Secondary | ICD-10-CM | POA: Diagnosis not present

## 2024-04-30 DIAGNOSIS — D649 Anemia, unspecified: Secondary | ICD-10-CM | POA: Diagnosis not present

## 2024-04-30 DIAGNOSIS — Z79899 Other long term (current) drug therapy: Secondary | ICD-10-CM | POA: Insufficient documentation

## 2024-04-30 DIAGNOSIS — R0609 Other forms of dyspnea: Secondary | ICD-10-CM | POA: Diagnosis not present

## 2024-04-30 DIAGNOSIS — Z6841 Body Mass Index (BMI) 40.0 and over, adult: Secondary | ICD-10-CM | POA: Diagnosis not present

## 2024-04-30 DIAGNOSIS — G4733 Obstructive sleep apnea (adult) (pediatric): Secondary | ICD-10-CM | POA: Diagnosis not present

## 2024-04-30 LAB — COMPREHENSIVE METABOLIC PANEL WITH GFR
ALT: 21 U/L (ref 0–44)
AST: 22 U/L (ref 15–41)
Albumin: 3.4 g/dL — ABNORMAL LOW (ref 3.5–5.0)
Alkaline Phosphatase: 71 U/L (ref 38–126)
Anion gap: 13 (ref 5–15)
BUN: 11 mg/dL (ref 6–20)
CO2: 23 mmol/L (ref 22–32)
Calcium: 8.4 mg/dL — ABNORMAL LOW (ref 8.9–10.3)
Chloride: 103 mmol/L (ref 98–111)
Creatinine, Ser: 1.38 mg/dL — ABNORMAL HIGH (ref 0.61–1.24)
GFR, Estimated: >60 mL/min (ref >60)
Glucose, Bld: 247 mg/dL — ABNORMAL HIGH (ref 70–99)
Potassium: 4.3 mmol/L (ref 3.5–5.1)
Sodium: 139 mmol/L (ref 135–145)
Total Bilirubin: 0.6 mg/dL (ref 0.0–1.2)
Total Protein: 6.5 g/dL (ref 6.5–8.1)

## 2024-04-30 LAB — MAGNESIUM: Magnesium: 2.6 mg/dL — ABNORMAL HIGH (ref 1.7–2.4)

## 2024-04-30 LAB — CBC
HCT: 36.3 % — ABNORMAL LOW (ref 39.0–52.0)
Hemoglobin: 11.9 g/dL — ABNORMAL LOW (ref 13.0–17.0)
MCH: 30.7 pg (ref 26.0–34.0)
MCHC: 32.8 g/dL (ref 30.0–36.0)
MCV: 93.6 fL (ref 80.0–100.0)
Platelets: 206 K/uL (ref 150–400)
RBC: 3.88 MIL/uL — ABNORMAL LOW (ref 4.22–5.81)
RDW: 15 % (ref 11.5–15.5)
WBC: 5.6 K/uL (ref 4.0–10.5)
nRBC: 0 % (ref 0.0–0.2)

## 2024-04-30 LAB — IRON AND TIBC
Iron: 45 ug/dL (ref 45–182)
Saturation Ratios: 11 % — ABNORMAL LOW (ref 17.9–39.5)
TIBC: 430 ug/dL (ref 250–450)
UIBC: 385 ug/dL

## 2024-04-30 LAB — LACTATE DEHYDROGENASE: LDH: 163 U/L (ref 105–235)

## 2024-04-30 LAB — VITAMIN B12: Vitamin B-12: 236 pg/mL (ref 180–914)

## 2024-04-30 LAB — PREALBUMIN: Prealbumin: 26 mg/dL (ref 18–38)

## 2024-04-30 LAB — FOLATE: Folate: >20 ng/mL (ref >5.9)

## 2024-04-30 LAB — FERRITIN: Ferritin: 17 ng/mL — ABNORMAL LOW (ref 24–336)

## 2024-04-30 LAB — TSH: TSH: 3.899 u[IU]/mL (ref 0.350–4.500)

## 2024-04-30 LAB — PROTIME-INR
INR: 1.5 — ABNORMAL HIGH (ref 0.8–1.2)
Prothrombin Time: 18.8 s — ABNORMAL HIGH (ref 11.4–15.2)

## 2024-04-30 MED ORDER — FUROSCIX 80 MG/10ML ~~LOC~~ CTKT: 80.0000 mg | CARTRIDGE | SUBCUTANEOUS | 0 refills | Status: DC | PRN

## 2024-04-30 MED ORDER — POTASSIUM CHLORIDE CRYS ER 20 MEQ PO TBCR: 40.0000 meq | EXTENDED_RELEASE_TABLET | ORAL | 3 refills | Status: AC | PRN

## 2024-04-30 NOTE — Progress Notes (Signed)
 HPI: This is a f/up for a patient who was last seen by me on 03/18/24 for type 2 diabetes.  His A1C was 8.5% at that time. He has been without the Mounjaro for 3 weeks d/t change in insurance. He has been notified that the Mounjaro is ready to pick up.  Patient denies any nausea or vomiting.   Medication Regimen:  Jardiance  25 mg Metformin  1000 mg BID Mounjaro at 10 mg weekly Tresiba 20 units  The following portions of the patient's history were reviewed and updated as appropriate: allergies, current medications, past medical history, past social history and problem list.  Current Outpatient Medications  Medication Instructions  . acetaminophen  (TYLENOL ) 1,000 mg  . allopurinoL  (ZYLOPRIM ) 100 mg tablet Take  by mouth.  . amiodarone  (PACERONE ) 200 mg, Daily  . blood-glucose sensor (Dexcom G7 Sensor) 1 each, miscellaneous, Every 10 Days, Use as directed to monitor blood sugar levels. Follow package directions for replacement.  . diclofenac  sodium (VOLTAREN ) 1 % gel Every 1 hour prn  . digoxin  (LANOXIN ) 0.125 mg  . ezetimibe  (ZETIA ) 10 mg, Daily  . gabapentin  (NEURONTIN ) 300 mg, 3 times daily  . ibuprofen  (MOTRIN ) 200 mg, Every 1 hour prn  . indomethacin  (INDOCIN ) 50 mg capsule Take  by mouth.  . Jardiance  25 mg, oral, Daily, for diabetes  . levothyroxine  (SYNTHROID ) 25 mcg  . metFORMIN  (GLUCOPHAGE -XR) 1,000 mg  . metOLazone  (ZAROXOLYN ) 2.5 mg  . mexiletine (MEXITIL ) 250 mg  . Mounjaro 10 mg, subcutaneous, Every 7 days, Dx E11.90  . omeprazole  (PriLOSEC) 40 mg DR capsule Take  by mouth.  . ondansetron  (ZOFRAN -ODT) 4 mg, Every 8 hours PRN  . pen needle, diabetic 31 gauge x 5/16 ndle For insulin  injections once a day  . potassium chloride  20 mEq ER tablet 60 mEq  . ranolazine  (RANEXA ) 500 mg  . rosuvastatin  (CRESTOR ) 40 mg, Daily  . sildenafiL  (REVATIO ) 20 mg  . sildenafiL  (VIAGRA ) 50 mg, Daily PRN  . spironolactone  (ALDACTONE ) 25 mg, Daily  . torsemide  (DEMADEX ) 40 mg, Daily  .  Tresiba FlexTouch U-100 20 Units, subcutaneous, Daily  . warfarin (COUMADIN ) 7.5 mg    Allergies[1]  Patient Active Problem List   Diagnosis Date Noted   . Morbid obesity with BMI of 40.0-44.9, adult (CMD) 03/18/2024  . Hyperlipidemia, mixed 03/18/2024  . Diabetes mellitus without complication    (CMD) 10/31/2023  . Carpal tunnel syndrome of right wrist 08/27/2015  . Left hand pain 08/16/2015  . Carpal tunnel syndrome of left wrist 08/16/2015    Resolved Problems  No resolved problems to display.    ROS: Appropriate review of system was done and it was negative except for as mentioned in HPI.   PHYSICAL EXAM:  General: Alert, pleasant, well-nourished, no acute distress HEENT:  vision grossly intact, no LAD  CV: Unable to assess due to L-VAD Resp: Normal respiratory effort, lungs clear to auscultation bilaterally Abd: Soft, nontender, not distended  Neuro: CN2-12 grossly intact, answers questions appropriately  Pysch: Pleasant mood, appropriate affect Extremities:  no edema noted Skin: no rashes or wounds noted   LABS/RADIOLOGY/MEDICAL RECORDS:    Recent Results (from the past 16 weeks)  POC Glucose (Hemocue/NOVA)   Collection Time: 03/18/24  8:31 AM  Result Value Ref Range   Glucose, POC 222 (A) 70 - 125 mg/dL   Glucose, POC     Kit/Device Lot # [id number]    Kit/Device Expiration Date 4857972   POC Hemoglobin A1c   Collection Time: 03/18/24  8:32 AM  Result Value Ref Range   Hemoglobin A1c (POC) 8.5 (A) 4.2 - 5.6 %   A1C Information      A1C Diagnostic Criteria: Prediabetes:5.7% - 6.4% Diabetes:>=6.5% A1C Glycemic Goals: Older Adults: <7.0% Children and Adolescents: <7.0% Pregnant Women: <6.0%   Kit/Device Lot # [id number]    Kit/Device Expiration Date 5837972    CGM reviewed and results discussed with patient: Dates: 10/30-11/12/25 ABG 242  VH 40% GMI --%  H 52% CV 19.4%  TIR 8% No hypoglycemic episodes noted on download or per patient Noted that he was  without Mounjaro for the 2 weeks he was on the sensor.  ASSESSMENT/PLAN: 1. Diabetes mellitus without complication    (CMD) (Primary) No changes due to being off the medication Once he restarts Mounjaro, he will monitor BG closely and let us  know if his BG do NOT go down so we can make adjustments.  2. Hyperlipidemia, mixed Lipid panel from 11/01/23 showed elevated TG, lower HDL He is followed closely by cardiology  3. Morbid obesity with BMI of 40.0-44.9, adult (CMD) He has a lot of fluid retention ?r/t heart? Will be doing another right heart cath         [1] Allergies Allergen Reactions  . Tramadol  Other (See Comments)    Hallucinations

## 2024-04-30 NOTE — Progress Notes (Signed)
 Pt presents for 3 week f/u and 1 year Intermacs  and annual maintenance in VAD Clinic today with his wife. Reports no problems with VAD equipment or concerns with drive line.  Pt walked into VAD Clinic unassisted. Pt reports he has not been feeling well over the last couple of weeks. He states he has increased dizziness/lightheadedness with extreme fatigue and having to nap a lot. He states he has been trying to do a little more but then crashes the next few days after. Pt reports intermittent sharp chest pains without a consistent pattern or clear relation to activity. Currently taking 60mg  of Torsemide  daily. Pt states his weight fluctuates around 20 pounds every few days. Discussed complaints with Dr. Bensimhon. MD felt pt was fluid up today. Advised to use Furosix cartridge as needed for increased fluid with an additional 40meq of Potassium on the days Furosix is administered.   Discussion about RHC with Cardiomems placement in the near future for tighter control on volume status held in the past. Discussed again with Dr.Bensimhon today. 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 Insurance approval pending with new insurance provider.   Pt complains of enlarged left testicle with pain extending up into his left hip. Assessed by Dr. Cherrie who felt it was an inguinal hernia. Due to size and pain Dr. Bensimhon advised for consultation with general surgery. Will place referral to [facility] with Dr. Ebbie.  Anemia panel collected today. Amb referral placed for IV iron.  Vital Signs:  Doppler Pressure: 106 Automatic BP: 105/85 (93) HR: 62 NSR SPO2: 95%   Weight: 340.6 lb w/ eqt Last weight: 338.8 lb w/ eqt BMI today 42.6 today   VAD Indication: Destination therapy - Seen by [facility] for transplant. Transplant team advised for VAD. Once pt lost 25lbs they would  reconsider for transplant.   LVAD assessment: HM III : Speed: 5800 rpms Flow: 5.3 Power: 4.6 w    PI: 3.4 Alarms: couple LV  Events: rare PI events  Fixed speed: 5800 Low speed limit: 5500   Primary controller: back up battery due for replacement in 18 months Secondary controller:  back up battery due for replacement in 19 months   I reviewed the LVAD parameters from today and compared the results to the patient's prior recorded data. LVAD interrogation was NEGATIVE for significant power changes, NEGATIVE for clinical alarms and STABLE for PI events/speed drops. No programming changes were made and pump is functioning within specified parameters. Pt is performing daily controller and system monitor self tests along with completing weekly and monthly maintenance for LVAD equipment.   LVAD equipment check completed and is in good working order. Back-up equipment present. Charged back up battery and performed self-test on equipment.    Annual Equipment Maintenance on UBC/PM was performed on 04/30/24.  Annual maintenance completed per [employer] on patient's home power module and warehouse manager.    Backup system controller 11 volt battery charged during visit.   1 year} Intermacs follow up completed including:  Quality of Life, KCCQ-12, and Neurocognitive trail making.   Pt unable to complete .   [place] Cardiomyopathy Questionnaire     04/30/2024    4:52 PM 10/22/2023    2:14 PM 07/19/2023    1:38 PM  KCCQ-12  1 a. Ability to shower/bathe Moderately limited Moderately limited Other, Did not do  1 b. Ability  to walk 1 block Moderately limited Moderately limited Moderately limited  1 c. Ability to hurry/jog Extremely limited Extremely limited Other, Did not do  2. Edema feet/ankles/legs Every morning 1-2 times a week 1-2 times a week  3. Limited by fatigue Several times a day Several times a day All of the time  4. Limited by dyspnea Several times a day At least once a  day Several times a day  5. Sitting up / on 3+ pillows 3+ times a week, not every day Every night 1-2 times a week  6. Limited enjoyment of life Moderately limited Moderately limited Moderately limited  7. Rest of life w/ symptoms Mostly dissatisfied Somewhat satisfied Somewhat satisfied  8 a. Participation in hobbies Limited quite a bit Limited quite a bit Severely limited  8 b. Participation in chores Limited quite a bit Severely limited Limited quite a bit  8 c. Visiting family/friends Slightly limited Moderately limited Moderately limited     Exit Site Care: Dressing change performed weekly on Fridays using weekly kit by pt's wife Lenore at home. Existing VAD dressing CDI. Anchor secure. No reports of redness, tenderness or drainage at exit site. Previously reported bruising has resolved. Pt given 8 weekly kits for home use.  Significant Events on VAD Support:    Device: St Jude Therapies: ON VF 200 Last check: 10/18/22   BP & Labs:  MAP 106 - Doppler is reflecting modified systolic  Hgb 11.9 - No S/S of bleeding. Specifically denies melena/BRBPR or nosebleeds.   LDH stable at 163 with established baseline of 190- 245. Denies tea-colored urine. No power elevations noted on interrogation.   Plan: Orders placed for Furosix as needed. On days Furosix is administered take 40 meq extra of Potassium Will call with scheduling for RHC and Cardiomems placement Coumadin  dosing per [facility]  Schuyler Lunger RN, BSN VAD Coordinator 24/7 Pager [phone]

## 2024-04-30 NOTE — Progress Notes (Signed)
 LVAD CLINIC NOTE  PCP: Primary Cardiologist: Lamar Fitch, MD HF MD: DB   Chief complaint: HF  HPI:  Joe [name] is a 53 y.o. male with h/o morbid obesity, DM2, HTN, HL, OSA, CAD s/p multiple stents to LAD and LCX  and systolic HF s/p Abbott CRT-D. Now s/p HM3 LVAD Implantation 11/14 by Dr. Fleeta Ochoa.  Underwent RHC and Ramp Echo 08/06/23  RHC 08/14/23  RA = 5 RV = 27/5 PA = 21/9 (14) PCW = 4 Fick cardiac output/index = 5.9/2.2 TD CO/CI = 5.8/2.2 PVR = 1.7 WU Ao sat = 94% PA sat = 61%,64% PAPi = 2.4  Interval hx: Here for routine f/u in VAD Clinic with his wife. Overall feeling so-so. Has good days and bad days. Struggling with his weight. Says weight can fluctuate as much as 20 pounds in one day. Intake is high. Taking torsemide  60mg  daily. + DOE. Denies orthopnea or PND. No fevers, chills or problems with driveline. No bleeding, melena or neuro symptoms. No VAD alarms. Taking all meds as prescribed.    VAD Indication: Destination therapy - Seen by [facility] for transplant. Transplant team advised for VAD. Once pt lost 25lbs they would reconsider for transplant.   LVAD Documentation    04/30/2024  Device Info  LVAD Type: Heartmate III  Date of Implant: 05/14/2023  Therapy Type: Destination Therapy      04/30/2024  Vitals  Heart Rate: 62 BPM  Automatic BP: 105/85  Doppler MAP: 106 mmHg  SpO2: 95 %    Last 3 Weights Weight Weight  04/30/2024 154.495 kg 340 lb 9.6 oz  03/14/2024 154.223 kg 340 lb  01/11/2024 154.495 kg 340 lb 9.6 oz       04/30/2024  LVAD Paramaters  Speed: 5800 RPM  Flow: 5 LPM  PI: 3  Power: 5 Watts  Hematocrit: 36 %  Alarms: Few Low Voltage  Events: rare  Last Speed Change Date: 05/14/2023  Last Ramp Echo Date: 05/14/2023  Last Right Heart Cath Date: 08/06/2023  Bleeding History: No  Type of Dressing: Weekly  Annual Maintenance Date: 04/30/2024    Labs    Units 04/30/24 1403 04/23/24 0000 04/10/24 0000 03/26/24 0000  03/19/24 0918 03/18/24 0000 03/14/24 1346  INR  1.5* 2.0 2.1   < > 1.4*   < > 1.4*  LDH U/L 163  --   --   --  166  --  164  HGB g/dL 88.0*  --   --   --  87.8*  --  11.8*  CREATININE mg/dL 8.61*  --   --   --  8.84  --  1.38*   < > = values in this interval not displayed.         Past Medical History:  Diagnosis Date   Abnormal EKG    Acute systolic heart failure (HCC) 09/06/2017   Angina pectoris 09/05/2017   Body mass index 45.0-49.9, adult (HCC)    CAD S/P percutaneous coronary angioplasty 09/07/2017   mLAD and dLAD PCI with DES 09/06/17   CHF (congestive heart failure) (HCC)    Chronic systolic (congestive) heart failure (HCC) 03/19/2019   Complication of anesthesia 1995   had hard time waking up; breathing w/vasectomy   Coronary artery disease    Erectile dysfunction    Essential hypertension, benign    Fatigue    GERD (gastroesophageal reflux disease)    Gout    High cholesterol    just started tx yesterday (09/06/2017)  History of gout    RX prn (09/06/2017)   Ischemic cardiomyopathy 09/07/2017   EF 25-35%   Muscular chest pain    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   Below Left Nare (MOH's)   Obesity    Obstructive sleep apnea    OSA on CPAP    Plantar fasciitis    Pre-operative clearance 02/11/2018   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/02/2019   Saint Jude   Right bundle branch block    Shortness of breath 09/05/2017   Testosterone deficiency    Type 2 diabetes mellitus with complication, without long-term current use of insulin  (HCC) 09/05/2017   Type II diabetes mellitus (HCC)    started tx 08/28/2017    Current Outpatient Medications  Medication Sig Dispense Refill   acetaminophen  (TYLENOL ) 500 MG tablet Take 1,000 mg by mouth every 6 (six) hours as needed for moderate pain or headache.     allopurinol  (ZYLOPRIM ) 100 MG tablet Take 100 mg by mouth in the morning.     amiodarone  (PACERONE ) 200 MG tablet  Take 1 tablet (200 mg total) by mouth daily. 90 tablet 3   colchicine 0.6 MG tablet Take 0.6 mg by mouth daily as needed (Gout).     diclofenac  Sodium (VOLTAREN ) 1 % GEL APPLY 1 G TOPICALLY DAILY. 100 g 0   digoxin  (LANOXIN ) 0.125 MG tablet Take 1 tablet (0.125 mg total) by mouth daily. 90 tablet 3   empagliflozin  (JARDIANCE ) 25 MG TABS tablet Take 25 mg by mouth daily.     escitalopram  (LEXAPRO ) 10 MG tablet Take 10 mg by mouth at bedtime.     ezetimibe  (ZETIA ) 10 MG tablet Take 1 tablet (10 mg total) by mouth daily. 90 tablet 3   Furosemide  (FUROSCIX ) 80 MG/10ML CTKT Inject 80 mg into the skin as needed. 8 each 0   gabapentin  (NEURONTIN ) 300 MG capsule Take 300 mg by mouth 3 (three) times daily.     insulin  degludec (TRESIBA) 100 UNIT/ML FlexTouch Pen Inject 20 Units into the skin daily.     levothyroxine  (SYNTHROID ) 25 MCG tablet Take 25 mcg by mouth daily before breakfast.     metFORMIN  (GLUCOPHAGE -XR) 500 MG 24 hr tablet Take 1,000 mg by mouth in the morning and at bedtime.     mexiletine (MEXITIL ) 250 MG capsule Take 1 capsule (250 mg total) by mouth 2 (two) times daily. 180 capsule 3   multivitamin (ONE-A-DAY MEN'S) TABS tablet Take 1 tablet by mouth in the morning.     omeprazole  (PRILOSEC) 40 MG capsule Take 1 capsule (40 mg total) by mouth daily. 90 capsule 1   ondansetron  (ZOFRAN -ODT) 4 MG disintegrating tablet Take 1 tablet (4 mg total) by mouth every 8 (eight) hours as needed for nausea or vomiting. 20 tablet 0   Polyethylene Glycol 400 (BLINK TEARS OP) Place 2 drops into both eyes 4 (four) times daily as needed (for dry eyes).     potassium chloride  SA (KLOR-CON  M) 20 MEQ tablet Take 3 tablets (60 mEq total) by mouth 2 (two) times daily. 540 tablet 3   potassium chloride  SA (KLOR-CON  M) 20 MEQ tablet Take 2 tablets (40 mEq total) by mouth as needed (Please take on days Furosix is administer). 90 tablet 3   ranolazine  (RANEXA ) 500 MG 12 hr tablet Take 1 tablet (500 mg total) by  mouth daily. 90 tablet 3   rosuvastatin  (CRESTOR ) 40 MG tablet Take 1 tablet (40 mg total) by mouth daily.  90 tablet 2   sildenafil  (REVATIO ) 20 MG tablet Take 1 tablet (20 mg total) by mouth 3 (three) times daily. 90 tablet 5   sildenafil  (VIAGRA ) 50 MG tablet Take 1 tablet (50 mg total) by mouth daily as needed for erectile dysfunction. 10 tablet 6   spironolactone  (ALDACTONE ) 25 MG tablet Take 1 tablet (25 mg total) by mouth daily. 90 tablet 3   tirzepatide (MOUNJARO) 7.5 MG/0.5ML Pen Inject 7.5 mg into the skin once a week. (Patient taking differently: Inject 10 mg into the skin once a week.)     torsemide  (DEMADEX ) 20 MG tablet Take 3 tablets (60 mg total) by mouth daily. 180 tablet 2   warfarin (COUMADIN ) 5 MG tablet Take 1.5 tablets (7.5 mg total) by mouth daily. 60 tablet 5   No current facility-administered medications for this encounter.      Vitals:   04/30/24 1439 04/30/24 1440  BP: 105/85 (!) 106/0  Pulse: 62   SpO2: 95%   Weight: (!) 154.5 kg (340 lb 9.6 oz)        Body mass index is 42.57 kg/m.  Physical Exam: General:  NAD.  HEENT: normal  Neck: supple. JVP 10.  Carotids 2+ bilat; no bruits. No lymphadenopathy or thryomegaly appreciated. Cor: LVAD hum.  Lungs: Clear. Abdomen: obese soft, nontender, non-distended. No hepatosplenomegaly. No bruits or masses. Good bowel sounds. Driveline site clean. Anchor in place.  Extremities: no cyanosis, clubbing, rash. Warm 1-2+  edema  Neuro: alert & oriented x 3. No focal deficits. Moves all 4 without problem      ASSESSMENT AND PLAN:  1. Chronic Systolic HF s/p HM3 Implantation 11/14 - due to iCM - Echo 05/01/23: EF 20%, RV moderately down (improved) - RHC (9/24): elevated biventricular filling pressures with low PAPi; CI 2.2 - S/p HM III LVAD 05/03/23 by Dr. Obadiah - RAMP echo 11/21: Speeds left at 6000.  Speed increased to 6100 on 11/27. Speed increased to 6200 on 05/18/23.  - Continue sildenafil  and digoxin   for RV failure.  - Had been struggling with RV failure. VAD speed turned down to 5800. RHC 2/25 showed well compensated hemodynamics with dry weight 307-309  - NYHA III (multifactorial) - Continues to struggle with his volume status likely due to a combination of RV failure and dietary inconsistency. Continue torsemide  60 daily with prn Furoscix . Have avoided metolazone  with previous SEs.  - Long talk about possible Cardiomems - given majority of HF is now R-sided, I am not sure how much this will help but certainly an option if he is interested,  - Continue spiro 25 - Has been seen at [facility] and will consider for transplant once BMI down but he is struggling to lose weight. Body mass index is 42.57 kg/m.  2 DL site irritation (possible early infection) - Resolved with doxy  3. LVAD management - s/p HM-3 implant 05/03/23 - VAD interrogated personally. Parameters stable. - INR 1.5 goal 2-3. Discussed warfarin dosing with PharmD personally. - LDH 164 - MAPs ok   4. H/o VT Arrest and Hx PVCs - had VT arrest x 2 in 10/24 - Continue amiodarone  100 mg daily and Mexiletine 250 mg bid.  - Will try to wean amio    5. CAD with chronic stable angina - s/p multiple stents to LAD and LCX - LHC (9/24) with stable 3v CAD - No s/s angina  6. DM2 - Per PCP   7. OSA - Not using CPAP, says machine was recalled  a while ago. - Needs new device   8. Morbid obesity - Continues to gain weight despite Mounjaro - Body mass index is 42.57 kg/m. - Understands need to get BMI down near 35 for transplant. We discussed again today  I spent a total of 42 minutes today: 1) reviewing the patient's medical records including previous charts, labs and recent notes from other providers; 2) examining the patient and counseling them on their medical issues/explaining the plan of care; 3) adjusting meds as needed and 4) ordering lab work or other needed tests.    Toribio Fuel, MD  10:14 PM

## 2024-05-01 LAB — T4: T4, Total: 7.5 ug/dL (ref 4.5–12.0)

## 2024-05-01 LAB — T3, FREE: T3, Free: 2.6 pg/mL (ref 2.0–4.4)

## 2024-05-02 ENCOUNTER — Other Ambulatory Visit (HOSPITAL_COMMUNITY): Payer: Self-pay | Admitting: Internal Medicine

## 2024-05-02 ENCOUNTER — Telehealth: Payer: Self-pay

## 2024-05-02 ENCOUNTER — Telehealth (HOSPITAL_COMMUNITY): Payer: Self-pay | Admitting: Internal Medicine

## 2024-05-02 DIAGNOSIS — D649 Anemia, unspecified: Secondary | ICD-10-CM | POA: Insufficient documentation

## 2024-05-02 DIAGNOSIS — D509 Iron deficiency anemia, unspecified: Secondary | ICD-10-CM | POA: Insufficient documentation

## 2024-05-02 NOTE — Telephone Encounter (Signed)
 Dr. Bensimhon, patient will be scheduled as soon as possible.  Auth Submission: NO AUTH NEEDED Site of care: Site of care: [facility] Payer: [employer] Medication & CPT/J Code(s) submitted: Venofer (Iron Sucrose) J1756 Diagnosis Code:  Route of submission (phone, fax, portal):  Phone # Fax # Auth type: Buy/Bill PB Units/visits requested: 300mg  x 3 doses Reference number:  Approval from: 05/02/24 to 06/18/24

## 2024-05-02 NOTE — Telephone Encounter (Signed)
 Patient referred to infusion pharmacy team for ambulatory infusion of IV iron.  Insurance - Building [employer] Engineer of care - Site of care: [facility] Dx code - D64.9/D50.9  IV Iron Therapy - Venofer 300 mg Iv x 3  Infusion appointments - Scheduling team will schedule patient as soon as possible.   Marili Vader D. Herminia Warren, PharmD

## 2024-05-04 ENCOUNTER — Other Ambulatory Visit (HOSPITAL_COMMUNITY): Payer: Self-pay | Admitting: Internal Medicine

## 2024-05-04 DIAGNOSIS — I5022 Chronic systolic (congestive) heart failure: Secondary | ICD-10-CM

## 2024-05-04 DIAGNOSIS — Z7901 Long term (current) use of anticoagulants: Secondary | ICD-10-CM

## 2024-05-04 DIAGNOSIS — Z95811 Presence of heart assist device: Secondary | ICD-10-CM

## 2024-05-06 ENCOUNTER — Ambulatory Visit (HOSPITAL_COMMUNITY): Payer: Self-pay | Admitting: Pharmacist

## 2024-05-06 LAB — POCT INR: INR: 1.7 — AB (ref 2.0–3.0)

## 2024-05-09 ENCOUNTER — Other Ambulatory Visit (HOSPITAL_COMMUNITY): Payer: Self-pay

## 2024-05-09 ENCOUNTER — Ambulatory Visit (INDEPENDENT_AMBULATORY_CARE_PROVIDER_SITE_OTHER)

## 2024-05-09 ENCOUNTER — Ambulatory Visit (HOSPITAL_COMMUNITY)
Admission: RE | Admit: 2024-05-09 | Discharge: 2024-05-09 | Disposition: A | Discharge status: Home / Self Care | Source: Ambulatory Visit | Attending: Cardiology | Admitting: Cardiology

## 2024-05-09 VITALS — HR 71 | Temp 97.5°F | Resp 20 | Ht 75.0 in | Wt 338.2 lb

## 2024-05-09 DIAGNOSIS — Z7901 Long term (current) use of anticoagulants: Secondary | ICD-10-CM

## 2024-05-09 DIAGNOSIS — Z95811 Presence of heart assist device: Secondary | ICD-10-CM

## 2024-05-09 DIAGNOSIS — D509 Iron deficiency anemia, unspecified: Secondary | ICD-10-CM | POA: Diagnosis not present

## 2024-05-09 DIAGNOSIS — D649 Anemia, unspecified: Secondary | ICD-10-CM

## 2024-05-09 LAB — BASIC METABOLIC PANEL WITH GFR
Anion gap: 9 (ref 5–15)
BUN: 10 mg/dL (ref 6–20)
CO2: 24 mmol/L (ref 22–32)
Calcium: 8.5 mg/dL — ABNORMAL LOW (ref 8.9–10.3)
Chloride: 106 mmol/L (ref 98–111)
Creatinine, Ser: 1.2 mg/dL (ref 0.61–1.24)
GFR, Estimated: >60 mL/min (ref >60)
Glucose, Bld: 150 mg/dL — ABNORMAL HIGH (ref 70–99)
Potassium: 4.3 mmol/L (ref 3.5–5.1)
Sodium: 139 mmol/L (ref 135–145)

## 2024-05-09 MED ORDER — IRON SUCROSE 300 MG IVPB - SIMPLE MED
300.0000 mg | Freq: Once | Status: AC
  Administered 2024-05-09: 300 mg via INTRAVENOUS

## 2024-05-09 NOTE — Progress Notes (Signed)
 Diagnosis: Iron  Deficiency Anemia  Provider:  Praveen Mannam MD  Procedure: IV Infusion  IV Type: Peripheral, IV Location: L Antecubital  Venofer  (Iron  Sucrose), Dose: 300 mg  Infusion Start Time: 1019  Infusion Stop Time: 1159  Post Infusion IV Care: Observation period completed and Peripheral IV Discontinued  Discharge: Condition: Good, Destination: Home . AVS Declined  Performed by:  Maximiano JONELLE Pouch, LPN

## 2024-05-13 ENCOUNTER — Ambulatory Visit (HOSPITAL_COMMUNITY): Payer: Self-pay | Admitting: Pharmacist

## 2024-05-13 LAB — POCT INR: INR: 1.9 — AB (ref 2.0–3.0)

## 2024-05-19 ENCOUNTER — Other Ambulatory Visit: Payer: Self-pay | Admitting: General Surgery

## 2024-05-19 DIAGNOSIS — K4030 Unilateral inguinal hernia, with obstruction, without gangrene, not specified as recurrent: Secondary | ICD-10-CM | POA: Diagnosis not present

## 2024-05-20 ENCOUNTER — Ambulatory Visit (INDEPENDENT_AMBULATORY_CARE_PROVIDER_SITE_OTHER)

## 2024-05-20 VITALS — HR 68 | Temp 97.4°F | Resp 18 | Ht 75.0 in | Wt 330.2 lb

## 2024-05-20 DIAGNOSIS — D509 Iron deficiency anemia, unspecified: Secondary | ICD-10-CM | POA: Diagnosis not present

## 2024-05-20 DIAGNOSIS — D649 Anemia, unspecified: Secondary | ICD-10-CM

## 2024-05-20 MED ORDER — IRON SUCROSE 300 MG IVPB - SIMPLE MED
300.0000 mg | Freq: Once | Status: AC
  Administered 2024-05-20: 300 mg via INTRAVENOUS
  Filled 2024-05-20: qty 265

## 2024-05-20 NOTE — Progress Notes (Signed)
 Diagnosis: Iron  Deficiency Anemia  Provider:  Praveen Mannam MD  Procedure: IV Infusion  IV Type: Peripheral, IV Location: L Antecubital   Venofer  (Iron  Sucrose), Dose: 300 mg  Infusion Start Time: 1002  Infusion Stop Time: 1142  Post Infusion IV Care: Observation period completed and Peripheral IV Discontinued  Discharge: Condition: Good, Destination: Home . AVS Provided  Performed by:  Graig Pouch, LPN

## 2024-05-21 ENCOUNTER — Ambulatory Visit (HOSPITAL_COMMUNITY): Payer: Self-pay

## 2024-05-21 DIAGNOSIS — I5022 Chronic systolic (congestive) heart failure: Secondary | ICD-10-CM

## 2024-05-21 DIAGNOSIS — Z95811 Presence of heart assist device: Secondary | ICD-10-CM

## 2024-05-21 DIAGNOSIS — Z7901 Long term (current) use of anticoagulants: Secondary | ICD-10-CM

## 2024-05-21 LAB — POCT INR: INR: 1.8 — AB (ref 2.0–3.0)

## 2024-05-21 MED ORDER — WARFARIN SODIUM 5 MG PO TABS: ORAL_TABLET | ORAL | Status: AC

## 2024-05-21 NOTE — Progress Notes (Signed)
 LVAD INR

## 2024-05-26 ENCOUNTER — Telehealth: Payer: Self-pay | Admitting: Pharmacist

## 2024-05-26 ENCOUNTER — Inpatient Hospital Stay: Admission: RE | Admit: 2024-05-26 | Discharge: 2024-05-26 | Attending: General Surgery | Admitting: General Surgery

## 2024-05-26 DIAGNOSIS — K4090 Unilateral inguinal hernia, without obstruction or gangrene, not specified as recurrent: Secondary | ICD-10-CM | POA: Diagnosis not present

## 2024-05-26 DIAGNOSIS — K4030 Unilateral inguinal hernia, with obstruction, without gangrene, not specified as recurrent: Secondary | ICD-10-CM

## 2024-05-27 ENCOUNTER — Ambulatory Visit

## 2024-05-27 VITALS — HR 69 | Temp 97.9°F | Resp 18 | Ht 75.0 in | Wt 343.2 lb

## 2024-05-27 DIAGNOSIS — D509 Iron deficiency anemia, unspecified: Secondary | ICD-10-CM

## 2024-05-27 DIAGNOSIS — D649 Anemia, unspecified: Secondary | ICD-10-CM

## 2024-05-27 MED ORDER — SODIUM CHLORIDE 0.9 % IV SOLN
300.0000 mg | Freq: Once | INTRAVENOUS | Status: AC
  Administered 2024-05-27: 300 mg via INTRAVENOUS
  Filled 2024-05-27: qty 15

## 2024-05-27 NOTE — Progress Notes (Signed)
 Diagnosis: Iron  Deficiency Anemia  Provider:  Praveen Mannam MD  Procedure: IV Infusion  IV Type: Peripheral, IV Location: L Antecubital   Venofer  (Iron  Sucrose), Dose: 300 mg  Infusion Start Time: 0825  Infusion Stop Time: 1006  Post Infusion IV Care: Patient declined observation and Peripheral IV Discontinued  Discharge: Condition: Good, Destination: Home . AVS Provided  Performed by:  Maximiano JONELLE Pouch, LPN

## 2024-05-28 ENCOUNTER — Ambulatory Visit (HOSPITAL_COMMUNITY): Payer: Self-pay | Admitting: Pharmacist

## 2024-05-28 LAB — POCT INR: INR: 2.1 (ref 2.0–3.0)

## 2024-05-28 NOTE — Telephone Encounter (Signed)
 Discussed with patient plans for warfarin check 3 days before procedure to target INR <1.5.

## 2024-06-04 ENCOUNTER — Ambulatory Visit (HOSPITAL_COMMUNITY): Payer: Self-pay | Admitting: Pharmacist

## 2024-06-04 LAB — POCT INR: INR: 2 (ref 2.0–3.0)

## 2024-06-13 ENCOUNTER — Telehealth (HOSPITAL_COMMUNITY): Payer: Self-pay | Admitting: Unknown Physician Specialty

## 2024-06-13 ENCOUNTER — Other Ambulatory Visit: Payer: Self-pay | Admitting: Cardiology

## 2024-06-13 NOTE — Telephone Encounter (Signed)
 06/12/24 - 2222: received page from pts wife stating pt has fever, chills and is vomiting. Denies any alarms from his VAD. Wife instructed that he can take Tyleno for fever.  06/13/24 - 0945 called pt to check on him this morning. He tells me that his fever broke overnight and he has not vomited since about 0300. Pt instructed to hydrate and rest. Pt will page again if he has any more issues.   Lauraine Ip RN, BSN VAD Coordinator 24/7 Pager [phone]

## 2024-06-16 ENCOUNTER — Ambulatory Visit (HOSPITAL_COMMUNITY): Payer: Self-pay | Admitting: Pharmacist

## 2024-06-16 LAB — POCT INR: INR: 1.9 — AB (ref 2.0–3.0)

## 2024-06-17 ENCOUNTER — Ambulatory Visit (HOSPITAL_COMMUNITY): Payer: Self-pay | Admitting: Pharmacist

## 2024-06-17 LAB — POCT INR: INR: 1.4 — AB (ref 2.0–3.0)

## 2024-06-20 ENCOUNTER — Other Ambulatory Visit (HOSPITAL_COMMUNITY): Payer: Self-pay

## 2024-06-20 DIAGNOSIS — Z95811 Presence of heart assist device: Secondary | ICD-10-CM

## 2024-06-20 DIAGNOSIS — Z7901 Long term (current) use of anticoagulants: Secondary | ICD-10-CM

## 2024-06-23 ENCOUNTER — Ambulatory Visit (HOSPITAL_COMMUNITY): Payer: Self-pay | Admitting: Pharmacist

## 2024-06-23 ENCOUNTER — Encounter (HOSPITAL_COMMUNITY): Payer: Self-pay

## 2024-06-23 ENCOUNTER — Other Ambulatory Visit (HOSPITAL_COMMUNITY): Payer: Self-pay

## 2024-06-23 ENCOUNTER — Other Ambulatory Visit: Payer: Self-pay

## 2024-06-23 ENCOUNTER — Ambulatory Visit (HOSPITAL_COMMUNITY)
Admission: RE | Admit: 2024-06-23 | Discharge: 2024-06-23 | Disposition: A | Discharge status: Home / Self Care | Source: Ambulatory Visit | Attending: Internal Medicine | Admitting: Internal Medicine

## 2024-06-23 ENCOUNTER — Other Ambulatory Visit (HOSPITAL_COMMUNITY): Payer: Self-pay | Admitting: *Deleted

## 2024-06-23 ENCOUNTER — Telehealth (HOSPITAL_COMMUNITY): Payer: Self-pay

## 2024-06-23 VITALS — BP 123/99 | HR 61 | Wt 349.4 lb

## 2024-06-23 DIAGNOSIS — R32 Unspecified urinary incontinence: Secondary | ICD-10-CM | POA: Insufficient documentation

## 2024-06-23 DIAGNOSIS — K4090 Unilateral inguinal hernia, without obstruction or gangrene, not specified as recurrent: Secondary | ICD-10-CM | POA: Diagnosis not present

## 2024-06-23 DIAGNOSIS — I5022 Chronic systolic (congestive) heart failure: Secondary | ICD-10-CM | POA: Diagnosis not present

## 2024-06-23 DIAGNOSIS — Z4801 Encounter for change or removal of surgical wound dressing: Secondary | ICD-10-CM | POA: Insufficient documentation

## 2024-06-23 DIAGNOSIS — I50810 Right heart failure, unspecified: Secondary | ICD-10-CM | POA: Diagnosis not present

## 2024-06-23 DIAGNOSIS — E118 Type 2 diabetes mellitus with unspecified complications: Secondary | ICD-10-CM

## 2024-06-23 DIAGNOSIS — Z79899 Other long term (current) drug therapy: Secondary | ICD-10-CM | POA: Insufficient documentation

## 2024-06-23 DIAGNOSIS — G4733 Obstructive sleep apnea (adult) (pediatric): Secondary | ICD-10-CM | POA: Diagnosis not present

## 2024-06-23 DIAGNOSIS — Z4509 Encounter for adjustment and management of other cardiac device: Secondary | ICD-10-CM | POA: Diagnosis present

## 2024-06-23 DIAGNOSIS — Z5181 Encounter for therapeutic drug level monitoring: Secondary | ICD-10-CM

## 2024-06-23 DIAGNOSIS — Z7901 Long term (current) use of anticoagulants: Secondary | ICD-10-CM

## 2024-06-23 DIAGNOSIS — Z95811 Presence of heart assist device: Secondary | ICD-10-CM

## 2024-06-23 DIAGNOSIS — R631 Polydipsia: Secondary | ICD-10-CM | POA: Insufficient documentation

## 2024-06-23 DIAGNOSIS — R0789 Other chest pain: Secondary | ICD-10-CM | POA: Insufficient documentation

## 2024-06-23 DIAGNOSIS — I5023 Acute on chronic systolic (congestive) heart failure: Secondary | ICD-10-CM

## 2024-06-23 DIAGNOSIS — I5042 Chronic combined systolic (congestive) and diastolic (congestive) heart failure: Secondary | ICD-10-CM

## 2024-06-23 DIAGNOSIS — R159 Full incontinence of feces: Secondary | ICD-10-CM | POA: Insufficient documentation

## 2024-06-23 LAB — BASIC METABOLIC PANEL WITH GFR
Anion gap: 8 (ref 5–15)
BUN: 9 mg/dL (ref 6–20)
CO2: 28 mmol/L (ref 22–32)
Calcium: 8.9 mg/dL (ref 8.9–10.3)
Chloride: 104 mmol/L (ref 98–111)
Creatinine, Ser: 1.17 mg/dL (ref 0.61–1.24)
GFR, Estimated: >60 mL/min
Glucose, Bld: 162 mg/dL — ABNORMAL HIGH (ref 70–99)
Potassium: 4.5 mmol/L (ref 3.5–5.1)
Sodium: 140 mmol/L (ref 135–145)

## 2024-06-23 LAB — CBC
HCT: 39.9 % (ref 39.0–52.0)
Hemoglobin: 12.9 g/dL — ABNORMAL LOW (ref 13.0–17.0)
MCH: 30.7 pg (ref 26.0–34.0)
MCHC: 32.3 g/dL (ref 30.0–36.0)
MCV: 95 fL (ref 80.0–100.0)
Platelets: 182 K/uL (ref 150–400)
RBC: 4.2 MIL/uL — ABNORMAL LOW (ref 4.22–5.81)
RDW: 15.6 % — ABNORMAL HIGH (ref 11.5–15.5)
WBC: 5.6 K/uL (ref 4.0–10.5)
nRBC: 0 % (ref 0.0–0.2)

## 2024-06-23 LAB — LACTATE DEHYDROGENASE: LDH: 289 U/L — ABNORMAL HIGH (ref 105–235)

## 2024-06-23 LAB — PROTIME-INR
INR: 1.3 — ABNORMAL HIGH (ref 0.8–1.2)
Prothrombin Time: 16.8 s — ABNORMAL HIGH (ref 11.4–15.2)

## 2024-06-23 MED ORDER — FUROSCIX 80 MG/10ML ~~LOC~~ CTKT
80.0000 mg | CARTRIDGE | SUBCUTANEOUS | 3 refills | Status: AC | PRN
  Filled 2024-06-23: qty 8, 8d supply, fill #0

## 2024-06-23 NOTE — Progress Notes (Signed)
 "   LVAD CLINIC NOTE  PCP: Primary Cardiologist: Lamar Fitch, MD HF MD: DB   Chief complaint: HF  HPI:  Aldo Sondgeroth is a 54 y.o. male with h/o morbid obesity, DM2, HTN, HL, OSA, CAD s/p multiple stents to LAD and LCX  and systolic HF s/p Abbott CRT-D. Now s/p HM3 LVAD Implantation 11/14 by Dr. Fleeta Ochoa.  Underwent RHC and Ramp Echo 08/06/23  RHC 08/14/23  RA = 5 RV = 27/5 PA = 21/9 (14) PCW = 4 Fick cardiac output/index = 5.9/2.2 TD CO/CI = 5.8/2.2 PVR = 1.7 WU Ao sat = 94% PA sat = 61%,64% PAPi = 2.4  Here for routine f/u in VAD Clinic with his wife. Continues to struggle with volume overload and weight gain. Says he is very thirsty all the time.Furoscix  has helped. States his weight fluctuates around 20-25 pounds every few days with the Furoscix . Denies orthopnea or PND. No fevers, chills or problems with driveline. No bleeding, melena or neuro symptoms. No VAD alarms. Taking all meds as prescribed.   Pt scheduled for hernia repair with Dr Ebbie 07/21/24 for left inguinal hernia. INR goal <1.5.    VAD Indication: Destination therapy - Seen by [facility] for transplant. Transplant team advised for VAD. Once pt lost 25lbs they would reconsider for transplant.   LVAD Documentation    06/23/2024  Device Info  LVAD Type: Heartmate III  Date of Implant: 05/14/2023  Therapy Type: Destination Therapy      06/23/2024  Vitals  Heart Rate: 61 BPM  Automatic BP: 123/99  Doppler MAP: 124 mmHg  SpO2: 96 %    Last 3 Weights Weight Weight  06/23/2024 158.487 kg 349 lb 6.4 oz  05/27/2024 155.674 kg 343 lb 3.2 oz  05/20/2024 149.778 kg 330 lb 3.2 oz       06/23/2024  LVAD Paramaters  Speed: 5800 RPM  Flow: 5 LPM  PI: 3  Power: 5 Watts  Hematocrit: 36 %  Alarms: Few Low Voltage  Events: rare  Last Speed Change Date: 05/14/2023  Last Ramp Echo Date: 05/14/2023  Last Right Heart Cath Date: 08/06/2023  Bleeding History: No  Type of Dressing: Weekly  Annual Maintenance Date:  04/30/2024    Labs    Units 06/23/24 1225 06/17/24 0000 06/16/24 0000 05/13/24 0000 05/09/24 1300 05/06/24 0000 04/30/24 1403 03/26/24 0000 03/19/24 0918  INR  1.3* 1.4* 1.9*   < >  --    < > 1.5*   < > 1.4*  LDH U/L 289*  --   --   --   --   --  163  --  166  HGB g/dL 87.0*  --   --   --   --   --  11.9*  --  12.1*  CREATININE mg/dL 8.82  --   --   --  8.79  --  1.38*  --  1.15   < > = values in this interval not displayed.         Past Medical History:  Diagnosis Date   Abnormal EKG    Acute systolic heart failure (HCC) 09/06/2017   Angina pectoris 09/05/2017   Body mass index 45.0-49.9, adult (HCC)    CAD S/P percutaneous coronary angioplasty 09/07/2017   mLAD and dLAD PCI with DES 09/06/17   CHF (congestive heart failure) (HCC)    Chronic systolic (congestive) heart failure (HCC) 03/19/2019   Complication of anesthesia 1995   had hard time waking up; breathing w/vasectomy  Coronary artery disease    Erectile dysfunction    Essential hypertension, benign    Fatigue    GERD (gastroesophageal reflux disease)    Gout    High cholesterol    just started tx yesterday (09/06/2017)   History of gout    RX prn (09/06/2017)   Ischemic cardiomyopathy 09/07/2017   EF 25-35%   Muscular chest pain    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   Below Left Nare (MOH's)   Obesity    Obstructive sleep apnea    OSA on CPAP    Plantar fasciitis    Pre-operative clearance 02/11/2018   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/02/2019   Saint Jude   Right bundle branch block    Shortness of breath 09/05/2017   Testosterone deficiency    Type 2 diabetes mellitus with complication, without long-term current use of insulin  (HCC) 09/05/2017   Type II diabetes mellitus (HCC)    started tx 08/28/2017    Current Outpatient Medications  Medication Sig Dispense Refill   acetaminophen  (TYLENOL ) 500 MG tablet Take 1,000 mg by mouth every 6 (six)  hours as needed for moderate pain or headache.     allopurinol  (ZYLOPRIM ) 100 MG tablet Take 100 mg by mouth in the morning.     amiodarone  (PACERONE ) 200 MG tablet Take 1 tablet (200 mg total) by mouth daily. 90 tablet 3   colchicine 0.6 MG tablet Take 0.6 mg by mouth daily as needed (Gout).     diclofenac  Sodium (VOLTAREN ) 1 % GEL APPLY 1 G TOPICALLY DAILY. 100 g 0   digoxin  (LANOXIN ) 0.125 MG tablet Take 1 tablet (0.125 mg total) by mouth daily. 90 tablet 3   empagliflozin  (JARDIANCE ) 25 MG TABS tablet Take 25 mg by mouth daily.     escitalopram  (LEXAPRO ) 10 MG tablet Take 10 mg by mouth at bedtime. (Patient taking differently: Take 10 mg by mouth at bedtime.)     ezetimibe  (ZETIA ) 10 MG tablet Take 1 tablet (10 mg total) by mouth daily. 90 tablet 3   gabapentin  (NEURONTIN ) 300 MG capsule Take 300 mg by mouth 3 (three) times daily.     insulin  degludec (TRESIBA) 100 UNIT/ML FlexTouch Pen Inject 20 Units into the skin daily.     levothyroxine  (SYNTHROID ) 25 MCG tablet Take 25 mcg by mouth daily before breakfast.     metFORMIN  (GLUCOPHAGE -XR) 500 MG 24 hr tablet Take 1,000 mg by mouth in the morning and at bedtime.     mexiletine (MEXITIL ) 250 MG capsule Take 1 capsule (250 mg total) by mouth 2 (two) times daily. 180 capsule 3   multivitamin (ONE-A-DAY MEN'S) TABS tablet Take 1 tablet by mouth in the morning.     omeprazole  (PRILOSEC) 40 MG capsule Take 1 capsule (40 mg total) by mouth daily. 90 capsule 1   Polyethylene Glycol 400 (BLINK TEARS OP) Place 2 drops into both eyes 4 (four) times daily as needed (for dry eyes).     potassium chloride  SA (KLOR-CON  M) 20 MEQ tablet Take 3 tablets (60 mEq total) by mouth 2 (two) times daily. 540 tablet 3   potassium chloride  SA (KLOR-CON  M) 20 MEQ tablet Take 2 tablets (40 mEq total) by mouth as needed (Please take on days Furosix is administer). 90 tablet 3   ranolazine  (RANEXA ) 500 MG 12 hr tablet Take 1 tablet (500 mg total) by mouth daily. 90  tablet 3   rosuvastatin  (CRESTOR ) 40 MG tablet TAKE 1  TABLET BY MOUTH EVERY DAY 90 tablet 2   sildenafil  (REVATIO ) 20 MG tablet Take 1 tablet (20 mg total) by mouth 3 (three) times daily. 90 tablet 5   sildenafil  (VIAGRA ) 50 MG tablet Take 1 tablet (50 mg total) by mouth daily as needed for erectile dysfunction. 10 tablet 6   spironolactone  (ALDACTONE ) 25 MG tablet Take 1 tablet (25 mg total) by mouth daily. 90 tablet 3   tirzepatide (MOUNJARO) 7.5 MG/0.5ML Pen Inject 7.5 mg into the skin once a week. (Patient taking differently: Inject 7.5 mg into the skin once a week.)     torsemide  (DEMADEX ) 20 MG tablet Take 3 tablets (60 mg total) by mouth daily. 180 tablet 2   warfarin (COUMADIN ) 5 MG tablet Take 10 mg (2 tablets) every Wed/Sat and 7.5 mg (1.5 tablets) all other days or as instructed by the HF clinic     Furosemide  (FUROSCIX ) 80 MG/10ML CTKT Inject 80 mg into the skin as needed. 8 each 3   ondansetron  (ZOFRAN -ODT) 4 MG disintegrating tablet Take 1 tablet (4 mg total) by mouth every 8 (eight) hours as needed for nausea or vomiting. (Patient not taking: Reported on 06/23/2024) 20 tablet 0   No current facility-administered medications for this encounter.      Vitals:   06/23/24 1113 06/23/24 1114  BP: (!) 124/0 (!) 123/99  Pulse: 61   SpO2: 96%   Weight: (!) 158.5 kg (349 lb 6.4 oz)     Body mass index is 43.67 kg/m.  Physical Exam: General:  NAD.  HEENT: normal  Neck: supple. JVP not elevated.  Carotids 2+ bilat; no bruits. No lymphadenopathy or thryomegaly appreciated. Cor: LVAD hum.  Lungs: Clear. Abdomen: obese soft, nontender, non-distended. No hepatosplenomegaly. No bruits or masses. Good bowel sounds. Driveline site clean. Anchor in place.  Extremities: no cyanosis, clubbing, rash. Warm 1-2+ edema  Neuro: alert & oriented x 3. No focal deficits. Moves all 4 without problem    ASSESSMENT AND PLAN:  1. Chronic Systolic HF s/p HM3 Implantation 11/14 - due to iCM - Echo  05/01/23: EF 20%, RV moderately down (improved) - RHC (9/24): elevated biventricular filling pressures with low PAPi; CI 2.2 - S/p HM III LVAD 05/03/23 by Dr. Obadiah - RAMP echo 11/21: Speeds left at 6000.  Speed increased to 6100 on 11/27. Speed increased to 6200 on 05/18/23.  - Continue sildenafil  and digoxin  for RV failure.  - Had been struggling with RV failure. VAD speed turned down to 5800. RHC 2/25 showed well compensated hemodynamics with dry weight 307-309  - Remains NYHA II-III (multifactorial) - Continues to struggle with his volume status likely due to a combination of RV failure and dietary indiscretion. Continue torsemide  60 daily with prn Furoscix . Have avoided metolazone  with previous SEs. No change,  - Previously discussed Cardiomems - given majority of HF is now R-sided, I am not sure how much this will help but certainly an option if he is interested,  - Continue spiro 25 - Has been seen at [facility] and will consider for transplant once BMI down but he is struggling to lose weight. Body mass index is 43.67 kg/m. No change  2 DL site irritation (possible early infection) - Resolved  3. LVAD management - s/p HM-3 implant 05/03/23 - VAD interrogated personally. Parameters stable. - INR 1.3 goal 2-3. Discussed warfarin dosing with PharmD personally. - LDH 289 - MAPs elevated today. Has not taken morning meds yet. Will follow   4. H/o VT  Arrest and Hx PVCs - had VT arrest x 2 in 10/24 - Continue amiodarone  100 mg daily and Mexiletine 250 mg bid.  - Continue to wean amio - Likely stop soon   5. CAD with chronic stable angina - s/p multiple stents to LAD and LCX - LHC (9/24) with stable 3v CAD - No s/s angina  6. DM2 - Per PCP - Last A1c 9.0 in 1/25   7. OSA - Not using CPAP, says machine was recalled a while ago. - Needs new device   8. Morbid obesity - Continues to gain weight despite Mounjaro - Body mass index is 43.67 kg/m. - Understands need to get BMI  down near 35 for transplant No change  9. Inguinal hernia - large - will be admitted for surgery 07/21/24. - Hold warfarin for several days before  INR goal < 1.6  I spent a total of 37 minutes today: 1) reviewing the patient's medical records including previous charts, labs and recent notes from other providers; 2) examining the patient and counseling them on their medical issues/explaining the plan of care; 3) adjusting meds as needed and 4) ordering lab work or other needed tests.    Toribio Fuel, MD  11:33 PM  "

## 2024-06-23 NOTE — Progress Notes (Signed)
 Specialty Pharmacy Initial Fill Coordination Note  Joe [name] is a 54 y.o. male contacted today regarding initial fill of specialty medication(s) Furosemide  (Furoscix )   Patient requested [facility] at [facility] at [place] date: 06/23/24   Medication will be filled on: 06/23/24    Patient is aware of 1,[phone] copayment.

## 2024-06-23 NOTE — Progress Notes (Addendum)
 Pt presents for 2 month f/u in VAD Clinic today with his wife. Reports no problems with VAD equipment or concerns with drive line.  Pt walked into VAD Clinic unassisted. Reports intermittent lightheadedness/dizziness, but this has improved since last visit. He is fatigued today, but has been working out in the yard, putting up a fence.  Pt reports rare intermittent sharp chest pains without a consistent pattern or clear relation to activity. Denies falls and signs of bleeding. Reports he is thirsty all the time. Intermittently having issues with bladder/bowel incontinence. Advised to follow up with PCP/urology.   Currently taking 60mg  of Torsemide  daily. Taking Furoscix  one a week with extra K. Pt states his weight fluctuates around 20-25 pounds every few days with the Furoscix . Pt out of cartridges currently. Will need prior authorization for renewed prescription. Reached out to Stephaine Henry CPht for assistance with PA. Pt will need medication prior to leaving on cruise this weekend.   (Addendum: Copay for Furoscix  is [phone] for 8 day supply. Discussed with pt and wife- they are agreeable to co-pay and request medication be filled. Refill sent to [facility] pharmacy. Pt plans to pick up medication tomorrow.)   Pt scheduled for hernia repair with Dr Ebbie 07/21/24 for left inguinal hernia. INR goal <1.5.   Vital Signs:  Doppler Pressure: 124 Automatic BP: 123/99 (106) HR: 61 NSR SPO2: 96%   Weight: 349.4 lb w/ eqt Last weight: 340.6 lb w/ eqt BMI today 43.6 today   VAD Indication: Destination therapy - Seen by [facility] for transplant. Transplant team advised for VAD. Once pt lost weight they would reconsider for transplant.   LVAD assessment: HM III : Speed: 5800 rpms Flow: 5.1 Power: 4.5 w    PI: 3.4 Alarms: couple LV  Events: rare   Fixed speed: 5800 Low speed limit: 5500   Primary controller: back up battery due for replacement in 16 months Secondary controller:  back up battery due  for replacement in 19 months   I reviewed the LVAD parameters from today and compared the results to the patient's prior recorded data. LVAD interrogation was NEGATIVE for significant power changes, NEGATIVE for clinical alarms and STABLE for PI events/speed drops. No programming changes were made and pump is functioning within specified parameters. Pt is performing daily controller and system monitor self tests along with completing weekly and monthly maintenance for LVAD equipment.   LVAD equipment check completed and is in good working order. Back-up equipment present. Charged back up battery and performed self-test on equipment.    Annual Equipment Maintenance on UBC/PM was performed on 04/30/24.   Exit Site Care: Dressing change performed weekly on Fridays using weekly kit by pt's wife Lenore at home. Existing VAD dressing removed and site care performed using sterile technique. Drive line exit site cleaned with Chlora prep applicators x 2, allowed to dry, and Sorbaview dressing with Silverlon patch applied. Exit site healed and incorporated, the velour is fully implanted at exit site. No redness, tenderness, drainage, foul odor or rash noted. Drive line anchor re-applied. Pt denies fever or chills. Pt given 8 weekly kits and 8 anchors for home use.  Significant Events on VAD Support:    Device: St Jude Therapies: ON VF 200 Last check: 10/18/22   BP & Labs:  MAP 124 - Doppler is reflecting modified systolic  Hgb 12.9 - No S/S of bleeding. Specifically denies melena/BRBPR or nosebleeds.   LDH stable at 289-hemolyzed with established baseline of 190- 245. Denies tea-colored urine.  No power elevations noted on interrogation.   Plan: No medication changes today Coumadin  dosing per Lauren PharmD Return to clinic in 2 months for follow up with Dr Cherrie  Isaiah Knoll RN VAD Coordinator  Office: [phone]  24/7 Pager: [phone]

## 2024-06-23 NOTE — Telephone Encounter (Signed)
 Advanced Heart Failure Patient Advocate Encounter  Prior authorization for Furoscix  has been submitted and approved. Test billing returns $376.18 for 1 day supply, [phone] for 8 day supply.  Key: [id number] Effective: 06/23/2024 to 06/18/2025  Rachel DEL, CPhT Rx Patient Advocate Phone: [phone]

## 2024-06-23 NOTE — Patient Instructions (Signed)
No medication changes today Coumadin dosing per Lauren PharmD Return to clinic in 2 months for follow up with Dr Haroldine Laws

## 2024-06-27 ENCOUNTER — Other Ambulatory Visit (HOSPITAL_COMMUNITY): Payer: Self-pay

## 2024-07-02 ENCOUNTER — Ambulatory Visit (HOSPITAL_COMMUNITY): Payer: Self-pay | Admitting: Pharmacist

## 2024-07-02 LAB — POCT INR: INR: 1.4 — AB (ref 2.0–3.0)

## 2024-07-10 ENCOUNTER — Ambulatory Visit (HOSPITAL_COMMUNITY): Payer: Self-pay | Admitting: Pharmacist

## 2024-07-10 ENCOUNTER — Telehealth (HOSPITAL_COMMUNITY): Payer: Self-pay | Admitting: Unknown Physician Specialty

## 2024-07-10 LAB — POCT INR: INR: 1.9 — AB (ref 2.0–3.0)

## 2024-07-10 NOTE — Telephone Encounter (Signed)

## 2024-07-10 NOTE — Progress Notes (Signed)
 Per Lauraine Ip, LVAD coordinator, patient will be admitted the night prior.  No PAT appointment needed.

## 2024-07-14 ENCOUNTER — Ambulatory Visit: Payer: BC Managed Care – PPO

## 2024-07-14 DIAGNOSIS — I255 Ischemic cardiomyopathy: Secondary | ICD-10-CM

## 2024-07-16 ENCOUNTER — Ambulatory Visit (HOSPITAL_COMMUNITY): Payer: Self-pay | Admitting: Pharmacist

## 2024-07-16 ENCOUNTER — Encounter (HOSPITAL_COMMUNITY): Payer: Self-pay

## 2024-07-16 LAB — CUP PACEART REMOTE DEVICE CHECK
Battery Remaining Longevity: 22 mo
Battery Remaining Percentage: 26 %
Battery Voltage: 2.9 V
Brady Statistic AP VP Percent: 4.4 %
Brady Statistic AP VS Percent: 1 %
Brady Statistic AS VP Percent: 95 %
Brady Statistic AS VS Percent: 1 %
Brady Statistic RA Percent Paced: 4.2 %
Date Time Interrogation Session: 20260127110952
HighPow Impedance: 77 Ohm
HighPow Impedance: 77 Ohm
Implantable Lead Connection Status: 753985
Implantable Lead Connection Status: 753985
Implantable Lead Connection Status: 753985
Implantable Lead Implant Date: 20200930
Implantable Lead Implant Date: 20200930
Implantable Lead Implant Date: 20200930
Implantable Lead Location: 753858
Implantable Lead Location: 753859
Implantable Lead Location: 753860
Implantable Pulse Generator Implant Date: 20200930
Lead Channel Impedance Value: 380 Ohm
Lead Channel Impedance Value: 490 Ohm
Lead Channel Impedance Value: 930 Ohm
Lead Channel Pacing Threshold Amplitude: 0.75 V
Lead Channel Pacing Threshold Amplitude: 1 V
Lead Channel Pacing Threshold Amplitude: 1.25 V
Lead Channel Pacing Threshold Pulse Width: 0.5 ms
Lead Channel Pacing Threshold Pulse Width: 0.5 ms
Lead Channel Pacing Threshold Pulse Width: 0.5 ms
Lead Channel Sensing Intrinsic Amplitude: 1.6 mV
Lead Channel Sensing Intrinsic Amplitude: 12 mV
Lead Channel Setting Pacing Amplitude: 2 V
Lead Channel Setting Pacing Amplitude: 2.5 V
Lead Channel Setting Pacing Amplitude: 2.5 V
Lead Channel Setting Pacing Pulse Width: 0.5 ms
Lead Channel Setting Pacing Pulse Width: 0.5 ms
Lead Channel Setting Sensing Sensitivity: 0.5 mV
Pulse Gen Serial Number: 9901124
Zone Setting Status: 755011

## 2024-07-16 LAB — POCT INR: INR: 1.7 — AB (ref 2.0–3.0)

## 2024-07-17 ENCOUNTER — Ambulatory Visit: Payer: Self-pay | Admitting: Cardiology

## 2024-07-18 ENCOUNTER — Inpatient Hospital Stay (HOSPITAL_COMMUNITY)
Admission: RE | Admit: 2024-07-18 | Discharge: 2024-07-25 | Disposition: A | Source: Ambulatory Visit | Attending: Cardiology | Admitting: Cardiology

## 2024-07-18 DIAGNOSIS — Z01810 Encounter for preprocedural cardiovascular examination: Principal | ICD-10-CM

## 2024-07-18 LAB — BASIC METABOLIC PANEL WITH GFR
Anion gap: 10 (ref 5–15)
BUN: 14 mg/dL (ref 6–20)
CO2: 28 mmol/L (ref 22–32)
Calcium: 8.7 mg/dL — ABNORMAL LOW (ref 8.9–10.3)
Chloride: 101 mmol/L (ref 98–111)
Creatinine, Ser: 1.28 mg/dL — ABNORMAL HIGH (ref 0.61–1.24)
GFR, Estimated: >60 mL/min
Glucose, Bld: 196 mg/dL — ABNORMAL HIGH (ref 70–99)
Potassium: 4.2 mmol/L (ref 3.5–5.1)
Sodium: 140 mmol/L (ref 135–145)

## 2024-07-18 LAB — CBC WITH DIFFERENTIAL/PLATELET
Abs Immature Granulocytes: 0.03 10*3/uL (ref 0.00–0.07)
Basophils Absolute: 0 10*3/uL (ref 0.0–0.1)
Basophils Relative: 1 %
Eosinophils Absolute: 0.1 10*3/uL (ref 0.0–0.5)
Eosinophils Relative: 1 %
HCT: 41.7 % (ref 39.0–52.0)
Hemoglobin: 13.9 g/dL (ref 13.0–17.0)
Immature Granulocytes: 1 %
Lymphocytes Relative: 22 %
Lymphs Abs: 1.2 10*3/uL (ref 0.7–4.0)
MCH: 31 pg (ref 26.0–34.0)
MCHC: 33.3 g/dL (ref 30.0–36.0)
MCV: 93.1 fL (ref 80.0–100.0)
Monocytes Absolute: 0.5 10*3/uL (ref 0.1–1.0)
Monocytes Relative: 8 %
Neutro Abs: 3.7 10*3/uL (ref 1.7–7.7)
Neutrophils Relative %: 67 %
Platelets: 211 10*3/uL (ref 150–400)
RBC: 4.48 MIL/uL (ref 4.22–5.81)
RDW: 15.1 % (ref 11.5–15.5)
WBC: 5.5 10*3/uL (ref 4.0–10.5)
nRBC: 0 % (ref 0.0–0.2)

## 2024-07-18 LAB — PROTIME-INR
INR: 1.6 — ABNORMAL HIGH (ref 0.8–1.2)
Prothrombin Time: 19.6 s — ABNORMAL HIGH (ref 11.4–15.2)

## 2024-07-18 LAB — GLUCOSE, CAPILLARY
Glucose-Capillary: 172 mg/dL — ABNORMAL HIGH (ref 70–99)
Glucose-Capillary: 207 mg/dL — ABNORMAL HIGH (ref 70–99)

## 2024-07-18 LAB — HEMOGLOBIN A1C
Hgb A1c MFr Bld: 8 % — ABNORMAL HIGH (ref 4.8–5.6)
Mean Plasma Glucose: 182.9 mg/dL

## 2024-07-18 LAB — HIV ANTIBODY (ROUTINE TESTING W REFLEX): HIV Screen 4th Generation wRfx: NONREACTIVE

## 2024-07-18 LAB — MAGNESIUM: Magnesium: 2.2 mg/dL (ref 1.7–2.4)

## 2024-07-18 LAB — LACTATE DEHYDROGENASE: LDH: 267 U/L — ABNORMAL HIGH (ref 105–235)

## 2024-07-18 LAB — MRSA NEXT GEN BY PCR, NASAL: MRSA by PCR Next Gen: NOT DETECTED

## 2024-07-18 MED ORDER — LEVOTHYROXINE SODIUM 25 MCG PO TABS
25.0000 ug | ORAL_TABLET | Freq: Every day | ORAL | Status: DC
  Administered 2024-07-19 – 2024-07-25 (×6): 25 ug via ORAL
  Filled 2024-07-18 (×6): qty 1

## 2024-07-18 MED ORDER — SPIRONOLACTONE 25 MG PO TABS
25.0000 mg | ORAL_TABLET | Freq: Every day | ORAL | Status: DC
  Administered 2024-07-18 – 2024-07-25 (×8): 25 mg via ORAL
  Filled 2024-07-18 (×8): qty 1

## 2024-07-18 MED ORDER — PANTOPRAZOLE SODIUM 40 MG PO TBEC
40.0000 mg | DELAYED_RELEASE_TABLET | Freq: Every day | ORAL | Status: DC
  Administered 2024-07-18 – 2024-07-25 (×8): 40 mg via ORAL
  Filled 2024-07-18 (×8): qty 1

## 2024-07-18 MED ORDER — ADULT MULTIVITAMIN W/MINERALS CH
1.0000 | ORAL_TABLET | Freq: Every morning | ORAL | Status: DC
  Administered 2024-07-19 – 2024-07-25 (×6): 1 via ORAL
  Filled 2024-07-18 (×7): qty 1

## 2024-07-18 MED ORDER — ALLOPURINOL 100 MG PO TABS
100.0000 mg | ORAL_TABLET | Freq: Every morning | ORAL | Status: DC
  Administered 2024-07-19 – 2024-07-25 (×6): 100 mg via ORAL
  Filled 2024-07-18 (×7): qty 1

## 2024-07-18 MED ORDER — GABAPENTIN 300 MG PO CAPS
300.0000 mg | ORAL_CAPSULE | Freq: Three times a day (TID) | ORAL | Status: DC
  Administered 2024-07-18 – 2024-07-25 (×20): 300 mg via ORAL
  Filled 2024-07-18 (×20): qty 1

## 2024-07-18 MED ORDER — CEFAZOLIN SODIUM-DEXTROSE 1-4 GM/50ML-% IV SOLN
1.0000 g | Freq: Once | INTRAVENOUS | Status: DC
  Filled 2024-07-18 (×2): qty 50

## 2024-07-18 MED ORDER — ALPRAZOLAM 0.25 MG PO TABS: 0.2500 mg | ORAL_TABLET | Freq: Two times a day (BID) | ORAL | Status: DC | PRN

## 2024-07-18 MED ORDER — MEXILETINE HCL 250 MG PO CAPS
250.0000 mg | ORAL_CAPSULE | Freq: Two times a day (BID) | ORAL | Status: DC
  Administered 2024-07-18 – 2024-07-25 (×14): 250 mg via ORAL
  Filled 2024-07-18 (×16): qty 1

## 2024-07-18 MED ORDER — ACETAMINOPHEN 325 MG PO TABS: 650.0000 mg | ORAL_TABLET | ORAL | Status: DC | PRN

## 2024-07-18 MED ORDER — EZETIMIBE 10 MG PO TABS
10.0000 mg | ORAL_TABLET | Freq: Every day | ORAL | Status: DC
  Administered 2024-07-18 – 2024-07-25 (×8): 10 mg via ORAL
  Filled 2024-07-18 (×8): qty 1

## 2024-07-18 MED ORDER — SILDENAFIL CITRATE 20 MG PO TABS
20.0000 mg | ORAL_TABLET | Freq: Three times a day (TID) | ORAL | Status: DC
  Administered 2024-07-18 – 2024-07-25 (×20): 20 mg via ORAL
  Filled 2024-07-18 (×20): qty 1

## 2024-07-18 MED ORDER — ESCITALOPRAM OXALATE 10 MG PO TABS
10.0000 mg | ORAL_TABLET | Freq: Every day | ORAL | Status: DC
  Administered 2024-07-18 – 2024-07-24 (×7): 10 mg via ORAL
  Filled 2024-07-18 (×7): qty 1

## 2024-07-18 MED ORDER — TORSEMIDE 20 MG PO TABS
60.0000 mg | ORAL_TABLET | Freq: Every day | ORAL | Status: DC
  Administered 2024-07-18 – 2024-07-24 (×7): 60 mg via ORAL
  Filled 2024-07-18 (×7): qty 3

## 2024-07-18 MED ORDER — DIGOXIN 125 MCG PO TABS
0.1250 mg | ORAL_TABLET | Freq: Every day | ORAL | Status: DC
  Administered 2024-07-18 – 2024-07-25 (×8): 0.125 mg via ORAL
  Filled 2024-07-18 (×8): qty 1

## 2024-07-18 MED ORDER — INSULIN ASPART 100 UNIT/ML IJ SOLN
0.0000 [IU] | Freq: Three times a day (TID) | INTRAMUSCULAR | Status: DC
  Administered 2024-07-18: 2 [IU] via SUBCUTANEOUS
  Administered 2024-07-19: 3 [IU] via SUBCUTANEOUS
  Administered 2024-07-19: 8 [IU] via SUBCUTANEOUS
  Administered 2024-07-19 – 2024-07-20 (×2): 5 [IU] via SUBCUTANEOUS
  Administered 2024-07-20 (×2): 3 [IU] via SUBCUTANEOUS
  Administered 2024-07-21: 5 [IU] via SUBCUTANEOUS
  Administered 2024-07-21: 3 [IU] via SUBCUTANEOUS
  Administered 2024-07-22: 8 [IU] via SUBCUTANEOUS
  Administered 2024-07-22: 5 [IU] via SUBCUTANEOUS
  Administered 2024-07-22: 1 [IU] via SUBCUTANEOUS
  Administered 2024-07-23: 15 [IU] via SUBCUTANEOUS
  Administered 2024-07-23: 5 [IU] via SUBCUTANEOUS
  Administered 2024-07-23: 11 [IU] via SUBCUTANEOUS
  Administered 2024-07-24: 15 [IU] via SUBCUTANEOUS
  Administered 2024-07-24: 5 [IU] via SUBCUTANEOUS
  Administered 2024-07-24: 8 [IU] via SUBCUTANEOUS
  Administered 2024-07-25: 15 [IU] via SUBCUTANEOUS
  Administered 2024-07-25: 8 [IU] via SUBCUTANEOUS
  Filled 2024-07-18: qty 5
  Filled 2024-07-18: qty 11
  Filled 2024-07-18: qty 15
  Filled 2024-07-18: qty 8
  Filled 2024-07-18 (×2): qty 15
  Filled 2024-07-18: qty 5
  Filled 2024-07-18 (×2): qty 2
  Filled 2024-07-18: qty 3
  Filled 2024-07-18 (×2): qty 5
  Filled 2024-07-18: qty 8
  Filled 2024-07-18: qty 5
  Filled 2024-07-18: qty 3
  Filled 2024-07-18: qty 5
  Filled 2024-07-18 (×2): qty 3
  Filled 2024-07-18: qty 8
  Filled 2024-07-18: qty 15

## 2024-07-18 MED ORDER — AMIODARONE HCL 200 MG PO TABS
200.0000 mg | ORAL_TABLET | Freq: Every day | ORAL | Status: DC
  Administered 2024-07-18 – 2024-07-25 (×8): 200 mg via ORAL
  Filled 2024-07-18 (×8): qty 1

## 2024-07-18 MED ORDER — INSULIN ASPART 100 UNIT/ML IJ SOLN
0.0000 [IU] | Freq: Every day | INTRAMUSCULAR | Status: DC
  Administered 2024-07-18: 2 [IU] via SUBCUTANEOUS
  Administered 2024-07-19: 3 [IU] via SUBCUTANEOUS
  Administered 2024-07-20: 2 [IU] via SUBCUTANEOUS
  Administered 2024-07-21: 5 [IU] via SUBCUTANEOUS
  Administered 2024-07-21: 3 [IU] via SUBCUTANEOUS
  Administered 2024-07-22: 5 [IU] via SUBCUTANEOUS
  Administered 2024-07-23 – 2024-07-24 (×2): 4 [IU] via SUBCUTANEOUS
  Filled 2024-07-18: qty 2
  Filled 2024-07-18: qty 5
  Filled 2024-07-18: qty 3
  Filled 2024-07-18 (×2): qty 4
  Filled 2024-07-18: qty 2
  Filled 2024-07-18: qty 3

## 2024-07-18 MED ORDER — ROSUVASTATIN CALCIUM 20 MG PO TABS
40.0000 mg | ORAL_TABLET | Freq: Every day | ORAL | Status: DC
  Administered 2024-07-18 – 2024-07-25 (×8): 40 mg via ORAL
  Filled 2024-07-18 (×8): qty 2

## 2024-07-18 MED ORDER — RANOLAZINE ER 500 MG PO TB12
500.0000 mg | ORAL_TABLET | Freq: Every day | ORAL | Status: DC
  Administered 2024-07-18 – 2024-07-25 (×8): 500 mg via ORAL
  Filled 2024-07-18 (×8): qty 1

## 2024-07-18 NOTE — H&P (View-Only) (Signed)
 54 year old male who presents with acute onset of left groin pain and a bulge that extends down into his scrotum. He has never had any hernia surgery before. This area is always there and is does not really reduce. He is having normal bowel function and no nausea vomiting. He had a VAD placed in November 2024 and is followed by the heart failure team for this. He is on warfarin. He also has a history of hypercholesterolemia, obstructive sleep apnea on CPAP, diabetes, hypertension as well as morbid obesity.  Review of Systems: A complete review of systems was obtained from the patient. I have reviewed this information and discussed as appropriate with the patient. See HPI as well for other ROS.  Review of Systems  Respiratory: Positive for shortness of breath.  Cardiovascular: Positive for leg swelling.  Gastrointestinal: Positive for abdominal pain and heartburn.  Psychiatric/Behavioral: Positive for depression.   Medical History: Past Medical History:  Diagnosis Date  BCC (basal cell carcinoma of skin)  CAD (coronary artery disease)  Cardiac resynchronization therapy defibrillator (CRT-D) in place  DM2 (diabetes mellitus, type 2) (CMS/HHS-HCC)  Erectile dysfunction  GERD (gastroesophageal reflux disease)  Gout  HFrEF (heart failure with reduced ejection fraction) (CMS/HHS-HCC)  HLD (hyperlipidemia)  HTN (hypertension)  OSA (obstructive sleep apnea)   Patient Active Problem List  Diagnosis  Type 2 diabetes mellitus (CMS/HHS-HCC)  Presence of cardiac resynchronization therapy defibrillator (CRT-D)  Ischemic cardiomyopathy  Coronary artery disease  CAD S/P percutaneous coronary angioplasty   Past Surgical History:  Procedure Laterality Date  coronary angioplasty  hx lumbar disc procedure  INSERTION ICD GENERATOR W/EXISTING LEADS  KNEE ARTHROSCOPY Right  VASECTOMY   No Known Allergies  Current Outpatient Medications on File Prior to Visit  Medication Sig Dispense Refill   acetaminophen  (TYLENOL ) 500 MG tablet Take 1,000 mg by mouth every 6 (six) hours as needed  allopurinoL  (ZYLOPRIM ) 100 MG tablet Take 100 mg by mouth once daily  diclofenac  (VOLTAREN ) 1 % topical gel Apply topically as needed  empagliflozin  (JARDIANCE ) 10 mg tablet Take 10 mg by mouth every morning before breakfast  escitalopram  oxalate (LEXAPRO ) 10 MG tablet Take 10 mg by mouth every morning  gabapentin  (NEURONTIN ) 100 MG capsule Take 100 mg by mouth 2 (two) times daily  ibuprofen  (MOTRIN ) 200 MG tablet Take 200 mg by mouth as needed  mexiletine (MEXITIL ) 250 MG capsule Take 250 mg by mouth 2 (two) times daily  nitroGLYcerin  (NITROSTAT ) 0.4 MG SL tablet Place 0.4 mg under the tongue every 5 (five) minutes as needed  omeprazole  (PRILOSEC) 40 MG DR capsule Take 40 mg by mouth once daily  oxymetazoline HCl (VICKS QLEARQUIL,OXYMETAZOLINE, NASAL) Inhale 1 Inhalation into the lungs as needed (both nostrils)  OZEMPIC 0.25 mg or 0.5 mg (2 mg/3 mL) pen injector Inject 1 mg subcutaneously once a week  potassium chloride  (KLOR-CON  M20) 20 MEQ ER tablet Take 40 mEq by mouth 2 (two) times daily  promethazine (PHENERGAN) 12.5 MG tablet 12.5 mg as needed  ranolazine  (RANEXA ) 500 MG ER tablet Take 500 mg by mouth 2 (two) times daily  rosuvastatin  (CRESTOR ) 40 MG tablet Take 40 mg by mouth once daily  RSH ONC polyethylene glycol 0.4%/propylene glycol 0.3% (IMMUNOGEN [id number]) -eIRB [id number] ophthalmic solution Apply 1-2 drops to eye as needed  triamcinolone 0.1 % cream Apply 1 Application topically 2 (two) times daily  isosorbide  mononitrate (IMDUR ) 30 MG ER tablet Take 1 tablet by mouth once daily (Patient not taking: Reported on 04/04/2023)  metOLazone  (ZAROXOLYN )  2.5 MG tablet 2.5 mg every Saturday  sacubitriL -valsartan  (ENTRESTO ) 24-26 mg tablet Take 1 tablet by mouth 2 (two) times daily (Patient not taking: Reported on 03/08/2023)  spironolactone  (ALDACTONE ) 25 MG tablet Take 1 tablet by mouth once  daily (Patient not taking: Reported on 04/04/2023)  TORsemide  (DEMADEX ) 20 MG tablet Take 40 mg by mouth 2 (two) times daily   Family History  Problem Relation Age of Onset  Cancer Mother  Brain cancer Mother  Lung cancer Mother  Cancer Father  Lymphoma Father  Cancer Brother  Diabetes Brother  Coronary Artery Disease (Blocked arteries around heart) Brother  Diabetes Paternal Grandfather    Social History   Tobacco Use  Smoking Status Never  Smokeless Tobacco Never  Marital status: Married  Tobacco Use  Smoking status: Never  Smokeless tobacco: Never  Vaping Use  Vaping status: Unknown  Substance and Sexual Activity  Alcohol  use: Not Currently  Comment: rare  Drug use: Never  Social History Narrative  Married,  Not working at this time: previously worked in engineer, 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  Constitutional:  Appearance: Normal appearance. He is obese.  Abdominal:  Palpations: Abdomen is soft.  Tenderness: There is no abdominal tenderness.  Hernia: A hernia (scrotal LIH) is present.  Neurological:  Mental Status: He is alert.   Assessment and Plan:   Incarcerated left inguinal hernia  Open left inguinal hernia repair Admission to VAD team preop, will need to transition off anticoagulation  I do think reasonable to consider fixing this although not low risk. He is at high risk of urgent surgery given what is now a chronically incarcerated hernia. Will admit to cardiology first and then plan surgery.  We discussed observation versus repair. We discussed both laparoscopic and open inguinal hernia repairs. I described the procedure in detail. Goals should be achieved with surgery. We discussed the usage of mesh and the rationale behind that. We went over the pathophysiology of inguinal hernia. We have elected to perform open inguinal hernia  repair with mesh. We discussed the risks including bleeding, infection, recurrence, postoperative pain and chronic groin pain, testicular injury, urinary retention, numbness in groin and around incision. This is in addition to his cardiac risk with surgery.

## 2024-07-18 NOTE — TOC Initial Note (Signed)
 Transition of Care [facility]) - Initial/Assessment Note    Patient Details  Name: Joe [name] MRN: [id number] Date of Birth: [birth year]  Transition [facility]) CM/SW Contact:    [name] Phone Number: [phone] 07/18/2024, 6:56 PM  Clinical Narrative:        6:49 PM- HF CSW called and spoke with the patient over the phone. Patient stated that he lives at home with his spouse. Patient stated prior to this hospitalization he was driving. Patient stated that he has no history of HH services. Patient stated that he does not use nay equipment. Patient stated that he has a scale at home. Patient stated that he has a PCP. CSW explained that a hospital follow up appointment is typically scheduled closer towards dc. Patient stated that he has an appointment already scheduled. CSW will confirm.   HF CSW/CM will continue to follow and monitor for dc readiness.            Expected Discharge Plan: Home/Self Care Barriers to Discharge: Continued Medical Work up   Patient Goals and CMS Choice Patient states their goals for this hospitalization and ongoing recovery are:: return home after surgery CMS Medicare.gov Compare Post Acute Care list provided to:: Patient Choice offered to / list presented to : Patient [place] ownership interest in [facility].provided to:: Patient    Expected Discharge Plan and Services       Living arrangements for the past 2 months: Single Family Home                                      Prior Living Arrangements/Services Living arrangements for the past 2 months: Single Family Home Lives with:: Spouse Patient language and need for interpreter reviewed:: Yes Do you feel safe going back to the place where you live?: Yes      Need for Family Participation in Patient Care: No (Comment) Care giver support system in place?: Yes (comment)   Criminal Activity/Legal Involvement Pertinent to Current Situation/Hospitalization:  No - Comment as needed  Activities of Daily Living   ADL Screening (condition at time of admission) Independently performs ADLs?: Yes (appropriate for developmental age) Is the patient deaf or have difficulty hearing?: No Does the patient have difficulty seeing, even when wearing glasses/contacts?: No Does the patient have difficulty concentrating, remembering, or making decisions?: No  Permission Sought/Granted Permission sought to share information with : Case Manager, Family Supports, PCP                Emotional Assessment Appearance:: Appears stated age Attitude/Demeanor/Rapport: Engaged Affect (typically observed): Appropriate Orientation: : Oriented to Self, Oriented to Place, Oriented to  Time, Oriented to Situation Alcohol  / Substance Use: Not Applicable Psych Involvement: No (comment)  Admission diagnosis:  Preoperative cardiovascular examination [Z01.810] Patient Active Problem List   Diagnosis Date Noted   Preoperative cardiovascular examination 07/18/2024   Anemia 05/02/2024   IDA (iron  deficiency anemia) 05/02/2024   Acute on chronic systolic (congestive) heart failure (HCC) 04/30/2023   Acute on chronic systolic congestive heart failure (HCC) 04/30/2023   HFrEF (heart failure with reduced ejection fraction) (HCC) 04/08/2023   Ventricular fibrillation (HCC) 04/08/2023   AKI (acute kidney injury) 04/08/2023   Cardiac arrest (HCC) 04/02/2023   VT (ventricular tachycardia) (HCC) 04/01/2023   Hypokalemia 03/29/2023   Prolonged QT interval 03/29/2023   Syncope 03/28/2023   Unstable angina (HCC)  11/27/2021   Palpitations 03/04/2021   Chest pain 03/04/2021   Body mass index 45.0-49.9, adult (HCC)    CHF (congestive heart failure) (HCC)    Fatigue    Hyperlipidemia    History of gout    Muscular chest pain    OSA on CPAP    Plantar fasciitis    DM2 (diabetes mellitus, type 2) (HCC)    Presence of cardiac resynchronization therapy defibrillator (CRT-D)  04/02/2019   Chronic systolic heart failure (HCC) 03/19/2019   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   Coronary artery disease    Preoperative examination 02/11/2018   CAD S/P percutaneous coronary angioplasty 09/07/2017   Ischemic cardiomyopathy 09/07/2017   Acute systolic heart failure (HCC) 09/06/2017   Angina pectoris 09/05/2017   Shortness of breath 09/05/2017   Type 2 diabetes mellitus with complication, without long-term current use of insulin  (HCC) 09/05/2017   Carpal tunnel syndrome of right wrist 08/27/2015   Left hand pain 08/16/2015   Carpal tunnel syndrome of left wrist 08/16/2015   Obstructive sleep apnea    GERD (gastroesophageal reflux disease)    Erectile dysfunction    Essential hypertension, benign    Obesity    Testosterone deficiency    Gout    Abnormal EKG    Right bundle branch block    Complication of anesthesia 1995   PCP:  Montey Lot, PA-C Pharmacy:   [facility] [phone] [place], [place] - [address] AT [facility] [address] [place] [address] Phone: [phone] Fax: [phone]  [facility] [address] [place] [address] Phone: [phone] Fax: [phone]  [facility] - [facility] [address] [place] [address] Phone: [phone] Fax: [phone]     Social Drivers of Health (SDOH) Social History: SDOH Screenings   Food Insecurity: No Food Insecurity (07/18/2024)  Housing: Low Risk (07/18/2024)  Transportation Needs: No Transportation Needs (07/18/2024)  Utilities: Not At Risk (07/18/2024)  Depression (PHQ2-9): Medium Risk (08/30/2023)  Financial Resource Strain: Low Risk (04/20/2023)  Social Connections: Unknown (10/28/2021)   Received from [facility]  Tobacco Use: Low Risk  (05/19/2024)   Received from [facility]   SDOH Interventions:     Readmission Risk Interventions     No data to display

## 2024-07-18 NOTE — Consult Note (Signed)
 54 year old male who presents with acute onset of left groin pain and a bulge that extends down into his scrotum. He has never had any hernia surgery before. This area is always there and is does not really reduce. He is having normal bowel function and no nausea vomiting. He had a VAD placed in November 2024 and is followed by the heart failure team for this. He is on warfarin. He also has a history of hypercholesterolemia, obstructive sleep apnea on CPAP, diabetes, hypertension as well as morbid obesity.  Review of Systems: A complete review of systems was obtained from the patient. I have reviewed this information and discussed as appropriate with the patient. See HPI as well for other ROS.  Review of Systems  Respiratory: Positive for shortness of breath.  Cardiovascular: Positive for leg swelling.  Gastrointestinal: Positive for abdominal pain and heartburn.  Psychiatric/Behavioral: Positive for depression.   Medical History: Past Medical History:  Diagnosis Date  BCC (basal cell carcinoma of skin)  CAD (coronary artery disease)  Cardiac resynchronization therapy defibrillator (CRT-D) in place  DM2 (diabetes mellitus, type 2) (CMS/HHS-HCC)  Erectile dysfunction  GERD (gastroesophageal reflux disease)  Gout  HFrEF (heart failure with reduced ejection fraction) (CMS/HHS-HCC)  HLD (hyperlipidemia)  HTN (hypertension)  OSA (obstructive sleep apnea)   Patient Active Problem List  Diagnosis  Type 2 diabetes mellitus (CMS/HHS-HCC)  Presence of cardiac resynchronization therapy defibrillator (CRT-D)  Ischemic cardiomyopathy  Coronary artery disease  CAD S/P percutaneous coronary angioplasty   Past Surgical History:  Procedure Laterality Date  coronary angioplasty  hx lumbar disc procedure  INSERTION ICD GENERATOR W/EXISTING LEADS  KNEE ARTHROSCOPY Right  VASECTOMY   No Known Allergies  Current Outpatient Medications on File Prior to Visit  Medication Sig Dispense Refill   acetaminophen  (TYLENOL ) 500 MG tablet Take 1,000 mg by mouth every 6 (six) hours as needed  allopurinoL  (ZYLOPRIM ) 100 MG tablet Take 100 mg by mouth once daily  diclofenac  (VOLTAREN ) 1 % topical gel Apply topically as needed  empagliflozin  (JARDIANCE ) 10 mg tablet Take 10 mg by mouth every morning before breakfast  escitalopram  oxalate (LEXAPRO ) 10 MG tablet Take 10 mg by mouth every morning  gabapentin  (NEURONTIN ) 100 MG capsule Take 100 mg by mouth 2 (two) times daily  ibuprofen  (MOTRIN ) 200 MG tablet Take 200 mg by mouth as needed  mexiletine (MEXITIL ) 250 MG capsule Take 250 mg by mouth 2 (two) times daily  nitroGLYcerin  (NITROSTAT ) 0.4 MG SL tablet Place 0.4 mg under the tongue every 5 (five) minutes as needed  omeprazole  (PRILOSEC) 40 MG DR capsule Take 40 mg by mouth once daily  oxymetazoline HCl (VICKS QLEARQUIL,OXYMETAZOLINE, NASAL) Inhale 1 Inhalation into the lungs as needed (both nostrils)  OZEMPIC 0.25 mg or 0.5 mg (2 mg/3 mL) pen injector Inject 1 mg subcutaneously once a week  potassium chloride  (KLOR-CON  M20) 20 MEQ ER tablet Take 40 mEq by mouth 2 (two) times daily  promethazine (PHENERGAN) 12.5 MG tablet 12.5 mg as needed  ranolazine  (RANEXA ) 500 MG ER tablet Take 500 mg by mouth 2 (two) times daily  rosuvastatin  (CRESTOR ) 40 MG tablet Take 40 mg by mouth once daily  RSH ONC polyethylene glycol 0.4%/propylene glycol 0.3% (IMMUNOGEN [id number]) -eIRB [id number] ophthalmic solution Apply 1-2 drops to eye as needed  triamcinolone 0.1 % cream Apply 1 Application topically 2 (two) times daily  isosorbide  mononitrate (IMDUR ) 30 MG ER tablet Take 1 tablet by mouth once daily (Patient not taking: Reported on 04/04/2023)  metOLazone  (ZAROXOLYN )  2.5 MG tablet 2.5 mg every Saturday  sacubitriL -valsartan  (ENTRESTO ) 24-26 mg tablet Take 1 tablet by mouth 2 (two) times daily (Patient not taking: Reported on 03/08/2023)  spironolactone  (ALDACTONE ) 25 MG tablet Take 1 tablet by mouth once  daily (Patient not taking: Reported on 04/04/2023)  TORsemide  (DEMADEX ) 20 MG tablet Take 40 mg by mouth 2 (two) times daily   Family History  Problem Relation Age of Onset  Cancer Mother  Brain cancer Mother  Lung cancer Mother  Cancer Father  Lymphoma Father  Cancer Brother  Diabetes Brother  Coronary Artery Disease (Blocked arteries around heart) Brother  Diabetes Paternal Grandfather    Social History   Tobacco Use  Smoking Status Never  Smokeless Tobacco Never  Marital status: Married  Tobacco Use  Smoking status: Never  Smokeless tobacco: Never  Vaping Use  Vaping status: Unknown  Substance and Sexual Activity  Alcohol  use: Not Currently  Comment: rare  Drug use: Never  Social History Narrative  Married,  Not working at this time: previously worked in engineer, 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  Constitutional:  Appearance: Normal appearance. He is obese.  Abdominal:  Palpations: Abdomen is soft.  Tenderness: There is no abdominal tenderness.  Hernia: A hernia (scrotal LIH) is present.  Neurological:  Mental Status: He is alert.   Assessment and Plan:   Incarcerated left inguinal hernia  Open left inguinal hernia repair Admission to VAD team preop, will need to transition off anticoagulation  I do think reasonable to consider fixing this although not low risk. He is at high risk of urgent surgery given what is now a chronically incarcerated hernia. Will admit to cardiology first and then plan surgery.  We discussed observation versus repair. We discussed both laparoscopic and open inguinal hernia repairs. I described the procedure in detail. Goals should be achieved with surgery. We discussed the usage of mesh and the rationale behind that. We went over the pathophysiology of inguinal hernia. We have elected to perform open inguinal hernia  repair with mesh. We discussed the risks including bleeding, infection, recurrence, postoperative pain and chronic groin pain, testicular injury, urinary retention, numbness in groin and around incision. This is in addition to his cardiac risk with surgery.

## 2024-07-18 NOTE — Progress Notes (Signed)
 LVAD Coordinator Rounding Note:   Admitted 07/18/24 for heparin  bridge and optimization prior to his Hernia repair with DR Ebbie posted for 07/21/24.    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   Pt sitting up on the side of the bed eating dinner on my arrival. States he feels good and he is ready for surgery on Monday morning. Dr Ebbie has been in to see the pt already today.    Vital signs: Temp: 98.4 HR: 64 Doppler Pressure: 84 Automatic BP: 96/76 (84) O2 Sat: 94% RA Wt: 323.3 lbs     LVAD interrogation reveals:  Speed: 5800 Flow: 5.4 Power: 4.7 w PI: 3.2  Alarms: several LV and no ext power on 1/25 - pt states his MPU kept coming unplugged from the outlet on the cruises [place]. Several LV and one NO External Power on 1/29 - pt states he loss power yesterday morning and he had a brief alarm prior to his Generac coming on Events: no PI events observed  Hematocrit: 41  Fixed speed: 5800 Low speed limit: 5500   Drive Line: Existing VAD dressing removed and site care performed using sterile technique. Drive line exit site cleaned with Chlora prep applicators x 2, allowed to dry, and Sorbaview dressing with Silverlon patch applied. Exit site healed and incorporated, the velour is fully implanted at exit site. No redness, tenderness, drainage, foul odor or rash noted. 2 large tegaderms place over dressing. Drive line anchor re-applied. Continue weekly dressing changes by nurse champion or VAD Coordinator. Next dressing change 07/25/24.    Labs:  LDH trend: 267  Hgb trend: 13.9   INR trend: 1.6  Anticoagulation Plan: -INR Goal: 2.0-2.5-holding for surgery -ASA Dose: 81 mg stop date of 06/02/23   Device: -St Jude BiV - Demand rate decreased to 60 05/16/23 -Therapies: ON    Adverse Events on VAD:   Plan/Recommendations:  1. Page VAD coordinators for any equipment or drive line issues 2. Weekly drive line dressing  changes per nurse champion or VAD Coordinator   Lauraine Ip RN,BSN VAD Coordinator  Office: [phone]  24/7 Pager: [phone]

## 2024-07-18 NOTE — H&P (Addendum)
 "    Advanced Heart Failure VAD History and Physical Note   PCP-Cardiologist: Lamar Fitch, MD  HF Cardiologist:  Dr. Cherrie Reason for Admission: Pre-op optimization HPI:    Joe [name] is a 54 y.o. male with h/o morbid obesity, DM2, HTN, HL, OSA, CAD s/p multiple stents to LAD and LCX and systolic HF s/p Abbott CRT-D. Now s/p HM3 LVAD Implantation 11/14 by Dr. Fleeta Ochoa.   Underwent RHC and Ramp Echo 08/06/23  RHC 08/14/23 : RA 5, RV 27/5, PA 21/9 (14), PCW 4, Fick cardiac output/index 5.9/2.2, TD CO/CI 5.8/2.2, PVR 1.7 WU, Ao sat 94%, PA sat 61%,64%, PAPi 2.4.   Reports compliance with all medications at home. Denies CP/SOB. Struggles with volume management at home d/t high PO intake, uses furoscix  every now and then when his weight goes up. Last used a couple days ago per his report. Feels fine today on admission. Here for pre-op optimization prior to hernia repair with Dr. Ebbie scheduled for 07/21/24. INR goal 1.5.   Wife at bedside. Has not had meds this morning. Having some incontinence of bowel and urine at times 2/2 urgency.   LVAD INTERROGATION:  HeartMate III LVAD:  Flow 5.6 liters/min, speed 5800, power 4.8, PI 3.1.  On batteries unable to fully interrogate but denies any alarms.   Home Medications Prior to Admission medications  Medication Sig Start Date End Date Taking? Authorizing Provider  acetaminophen  (TYLENOL ) 500 MG tablet Take 1,000 mg by mouth every 6 (six) hours as needed for moderate pain or headache.    [provider]  allopurinol  (ZYLOPRIM ) 100 MG tablet Take 100 mg by mouth in the morning.    [provider]  amiodarone  (PACERONE ) 200 MG tablet Take 1 tablet (200 mg total) by mouth daily. 12/31/23   Bensimhon, Daniel R, MD  Aromatic Inhalants (VICKS VAPOINHALER) INHA Inhale 1 Inhalation into the lungs daily as needed (Congestion).    [provider]  colchicine 0.6 MG tablet Take 0.6 mg by mouth daily as needed (Gout).  05/10/20   [provider]  diclofenac  Sodium (VOLTAREN ) 1 % GEL APPLY 1 G TOPICALLY DAILY. Patient taking differently: Apply 2 g topically daily as needed (pain). 01/11/23   Standiford, Marsa FALCON, DPM  digoxin  (LANOXIN ) 0.125 MG tablet Take 1 tablet (0.125 mg total) by mouth daily. 10/04/23   Bensimhon, Toribio SAUNDERS, MD  empagliflozin  (JARDIANCE ) 25 MG TABS tablet Take 25 mg by mouth in the morning.    [provider]  escitalopram  (LEXAPRO ) 10 MG tablet Take 10 mg by mouth at bedtime. Patient taking differently: Take 20 mg by mouth at bedtime. 06/24/21   [provider]  ezetimibe  (ZETIA ) 10 MG tablet Take 1 tablet (10 mg total) by mouth daily. 10/04/23   Bensimhon, Toribio SAUNDERS, MD  Furosemide  (FUROSCIX ) 80 MG/10ML CTKT Inject 80 mg into the skin as needed. 06/23/24   Bensimhon, Toribio SAUNDERS, MD  gabapentin  (NEURONTIN ) 300 MG capsule Take 300 mg by mouth 3 (three) times daily. 04/16/23   [provider]  insulin  degludec (TRESIBA) 100 UNIT/ML FlexTouch Pen Inject 20 Units into the skin in the morning.    [provider]  levothyroxine  (SYNTHROID ) 25 MCG tablet Take 25 mcg by mouth daily before breakfast. 04/17/23   [provider]  metFORMIN  (GLUCOPHAGE -XR) 500 MG 24 hr tablet Take 1,000 mg by mouth in the morning and at bedtime. 07/24/23   [provider]  mexiletine (MEXITIL ) 250 MG capsule Take 1 capsule (  250 mg total) by mouth 2 (two) times daily. 10/04/23   Bensimhon, Daniel R, MD  multivitamin (ONE-A-DAY MEN'S) TABS tablet Take 1 tablet by mouth in the morning.    [provider]  omeprazole  (PRILOSEC) 40 MG capsule Take 1 capsule (40 mg total) by mouth daily. 01/14/19   Krasowski, Robert J, MD  ondansetron  (ZOFRAN -ODT) 4 MG disintegrating tablet Take 1 tablet (4 mg total) by mouth every 8 (eight) hours as needed for nausea or vomiting. 08/03/23   Zenaida Morene PARAS, MD  Polyethylene Glycol 400 (BLINK TEARS OP) Place 2 drops into both eyes 4  (four) times daily as needed (for dry eyes).    [provider]  potassium chloride  SA (KLOR-CON  M) 20 MEQ tablet Take 2 tablets (40 mEq total) by mouth as needed (Please take on days Furosix is administer). Patient taking differently: Take 60 mEq by mouth 2 (two) times daily. 04/30/24   Bensimhon, Toribio SAUNDERS, MD  ranolazine  (RANEXA ) 500 MG 12 hr tablet Take 1 tablet (500 mg total) by mouth daily. 01/11/24   Zenaida Morene PARAS, MD  rosuvastatin  (CRESTOR ) 40 MG tablet TAKE 1 TABLET BY MOUTH EVERY DAY 06/13/24   Krasowski, Robert J, MD  sildenafil  (REVATIO ) 20 MG tablet Take 1 tablet (20 mg total) by mouth 3 (three) times daily. 10/04/23   Bensimhon, Toribio SAUNDERS, MD  sildenafil  (VIAGRA ) 50 MG tablet Take 1 tablet (50 mg total) by mouth daily as needed for erectile dysfunction. 08/21/23   Bensimhon, Toribio SAUNDERS, MD  spironolactone  (ALDACTONE ) 25 MG tablet Take 1 tablet (25 mg total) by mouth daily. 10/04/23   Bensimhon, Daniel R, MD  tirzepatide [facility]) 7.5 MG/0.5ML Pen Inject 7.5 mg into the skin once a week. Patient taking differently: Inject 10 mg into the skin once a week.    [provider]  torsemide  (DEMADEX ) 20 MG tablet Take 3 tablets (60 mg total) by mouth daily. 03/14/24   Sabharwal, Aditya, DO  triamcinolone cream (KENALOG) 0.1 % Apply 1 Application topically 2 (two) times daily as needed (Eczema). 04/05/24   [provider]  warfarin (COUMADIN ) 5 MG tablet Take 10 mg (2 tablets) every Wed/Sat and 7.5 mg (1.5 tablets) all other days or as instructed by the HF clinic 05/21/24   Rolan Joe RAMAN, MD    Past Medical History: Past Medical History:  Diagnosis Date   Abnormal EKG    Acute systolic heart failure (HCC) 09/06/2017   Angina pectoris 09/05/2017   Body mass index 45.0-49.9, adult (HCC)    CAD S/P percutaneous coronary angioplasty 09/07/2017   mLAD and dLAD PCI with DES 09/06/17   CHF (congestive heart failure) (HCC)    Chronic systolic (congestive) heart  failure (HCC) 03/19/2019   Complication of anesthesia 1995   had hard time waking up; breathing w/vasectomy   Coronary artery disease    Erectile dysfunction    Essential hypertension, benign    Fatigue    GERD (gastroesophageal reflux disease)    Gout    High cholesterol    just started tx yesterday (09/06/2017)   History of gout    RX prn (09/06/2017)   Ischemic cardiomyopathy 09/07/2017   EF 25-35%   Muscular chest pain    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   Below Left Nare (MOH's)   Obesity    Obstructive sleep apnea    OSA on CPAP    Plantar fasciitis    Pre-operative clearance 02/11/2018   Presence of cardiac resynchronization  therapy defibrillator (CRT-D) 04/02/2019   [facility] Jude   Right bundle branch block    Shortness of breath 09/05/2017   Testosterone deficiency    Type 2 diabetes mellitus with complication, without long-term current use of insulin  (HCC) 09/05/2017   Type II diabetes mellitus (HCC)    started tx 08/28/2017    Past Surgical History: Past Surgical History:  Procedure Laterality Date   BACK SURGERY     BIV ICD INSERTION CRT-D N/A 03/19/2019   Procedure: BIV ICD INSERTION CRT-D;  Surgeon: Inocencio Soyla Lunger, MD;  Location: [facility] INVASIVE CV LAB;  Service: Cardiovascular;  Laterality: N/A;   CORONARY ANGIOPLASTY WITH STENT PLACEMENT  09/06/2017   3 stents   CORONARY STENT INTERVENTION N/A 09/06/2017   Procedure: CORONARY STENT INTERVENTION;  Surgeon: Dann Candyce RAMAN, MD;  Location: [facility] INVASIVE CV LAB;  Service: Cardiovascular;  Laterality: N/A;   CORONARY STENT INTERVENTION N/A 02/15/2018   Procedure: CORONARY STENT INTERVENTION;  Surgeon: Dann Candyce RAMAN, MD;  Location: [facility] INVASIVE CV LAB;  Service: Cardiovascular;  Laterality: N/A;   INSERTION OF IMPLANTABLE LEFT VENTRICULAR ASSIST DEVICE N/A 05/03/2023   Procedure: INSERTION OF IMPLANTABLE LEFT VENTRICULAR ASSIST DEVICE - HEARTMATE 3;  Surgeon: Obadiah Coy, MD;  Location:  [facility] OR;  Service: Open Heart Surgery;  Laterality: N/A;   KNEE ARTHROSCOPY Right    LEFT HEART CATH AND CORONARY ANGIOGRAPHY N/A 09/06/2017   Procedure: LEFT HEART CATH AND CORONARY ANGIOGRAPHY;  Surgeon: Dann Candyce RAMAN, MD;  Location: [facility] INVASIVE CV LAB;  Service: Cardiovascular;  Laterality: N/A;   LEFT HEART CATH AND CORONARY ANGIOGRAPHY N/A 02/15/2018   Procedure: LEFT HEART CATH AND CORONARY ANGIOGRAPHY;  Surgeon: Dann Candyce RAMAN, MD;  Location: [facility] INVASIVE CV LAB;  Service: Cardiovascular;  Laterality: N/A;   LEFT HEART CATH AND CORONARY ANGIOGRAPHY N/A 06/27/2018   Procedure: LEFT HEART CATH AND CORONARY ANGIOGRAPHY;  Surgeon: Dann Candyce RAMAN, MD;  Location: [facility] INVASIVE CV LAB;  Service: Cardiovascular;  Laterality: N/A;   LEFT HEART CATH AND CORONARY ANGIOGRAPHY N/A 06/28/2020   Procedure: LEFT HEART CATH AND CORONARY ANGIOGRAPHY;  Surgeon: Jordan, Peter M, MD;  Location: [facility] INVASIVE CV LAB;  Service: Cardiovascular;  Laterality: N/A;   LUMBAR SPINE SURGERY  ~ 2001   Intradiscal Electrothermoplasty   RIGHT HEART CATH N/A 04/30/2023   Procedure: RIGHT HEART CATH;  Surgeon: Cherrie Toribio SAUNDERS, MD;  Location: [facility] INVASIVE CV LAB;  Service: Cardiovascular;  Laterality: N/A;   RIGHT HEART CATH N/A 08/06/2023   Procedure: RIGHT HEART CATH;  Surgeon: Cherrie Toribio SAUNDERS, MD;  Location: [facility] INVASIVE CV LAB;  Service: Cardiovascular;  Laterality: N/A;   RIGHT/LEFT HEART CATH AND CORONARY ANGIOGRAPHY N/A 03/07/2021   Procedure: RIGHT/LEFT HEART CATH AND CORONARY ANGIOGRAPHY;  Surgeon: Rolan Joe RAMAN, MD;  Location: [facility] INVASIVE CV LAB;  Service: Cardiovascular;  Laterality: N/A;   RIGHT/LEFT HEART CATH AND CORONARY ANGIOGRAPHY N/A 02/23/2023   Procedure: RIGHT/LEFT HEART CATH AND CORONARY ANGIOGRAPHY;  Surgeon: Cherrie Toribio SAUNDERS, MD;  Location: [facility] INVASIVE CV LAB;  Service: Cardiovascular;  Laterality: N/A;   TEE WITHOUT CARDIOVERSION N/A 05/03/2023   Procedure: TRANSESOPHAGEAL  ECHOCARDIOGRAM;  Surgeon: Obadiah Coy, MD;  Location: [facility] OR;  Service: Open Heart Surgery;  Laterality: N/A;   VASECTOMY  1995    Family History: Family History  Problem Relation Age of Onset   Lung cancer Mother    Brain cancer Mother    Lymphoma Father    Heart disease Brother    Heart  attack Brother    Diabetes Mellitus II Brother    Diabetes Mellitus II Paternal Grandfather     Social History: Social History   Socioeconomic History   Marital status: Married    Spouse name: Not on file   Number of children: Not on file   Years of education: Not on file   Highest education level: Not on file  Occupational History   Not on file  Tobacco Use   Smoking status: Never   Smokeless tobacco: Never  Vaping Use   Vaping status: Never Used  Substance and Sexual Activity   Alcohol  use: No   Drug use: Never   Sexual activity: Not on file  Other Topics Concern   Not on file  Social History Narrative   Not on file   Social Drivers of Health   Tobacco Use: Low Risk  (05/19/2024)   Received from [facility]   Patient History    Smoking Tobacco Use: Never    Smokeless Tobacco Use: Never    Passive Exposure: Not on file  Financial Resource Strain: Low Risk (04/20/2023)   Overall Financial Resource Strain (CARDIA)    Difficulty of Paying Living Expenses: Not hard at all  Food Insecurity: Low Risk (11/01/2023)   Received from [facility]   Epic    Within the past 12 months, you worried that your food would run out before you got money to buy more: Never true    Within the past 12 months, the food you bought just didn't last and you didn't have money to get more. : Never true  Transportation Needs: No Transportation Needs (11/01/2023)   Received from [facility]    In the past 12 months, has lack of reliable transportation kept you from medical appointments, meetings, work or from getting things needed for daily living? : No  Physical  Activity: Not on file  Stress: Not on file  Social Connections: Unknown (10/28/2021)   Received from [facility]   Social Network    Social Network: Not on file  Depression (PHQ2-9): Medium Risk (08/30/2023)   Depression (PHQ2-9)    PHQ-2 Score: 7  Alcohol  Screen: Not on file  Housing: Unknown (05/19/2024)   Received from [facility]   Epic    Unable to Pay for Housing in the Last Year: Not on file    Number of Times Moved in the Last Year: Not on file    At any time in the past 12 months, were you homeless or living in a shelter (including now)?: No  Utilities: Low Risk (11/01/2023)   Received from [facility]   Utilities    In the past 12 months has the electric, gas, oil, or water company threatened to shut off services in your home? : No  Health Literacy: Not on file    Allergies:  Allergies[1]  Objective:   Vital Signs:   Temp:  [97.7 F (36.5 C)] 97.7 F (36.5 C) (01/30 1259) Pulse Rate:  [64] 64 (01/30 1259) Resp:  [11] 11 (01/30 1259) BP: (115)/(93) 115/93 (01/30 1259) SpO2:  [100 %] 100 % (01/30 1259) Weight:  [146.6 kg] 146.6 kg (01/30 1243)   Filed Weights   07/18/24 1243  Weight: (!) 146.6 kg    Mean arterial Pressure pending  Physical Exam    General:  well appearing.  Cor: Mechanical heart sounds with LVAD hum present. JVD flat.  Lungs: Clear Driveline: C/D/I; securement device intact  and driveline incorporated Extremities: non-pitting BLE edema  Telemetry   SR 60s-70s (Personally reviewed)    Labs   Basic Metabolic Panel: No results for input(s): NA, K, CL, CO2, GLUCOSE, BUN, CREATININE, CALCIUM , MG, PHOS in the last 168 hours.  Liver Function Tests: No results for input(s): AST, ALT, ALKPHOS, BILITOT, PROT, ALBUMIN  in the last 168 hours. No results for input(s): LIPASE, AMYLASE in the last 168 hours. No results for input(s): AMMONIA in the last 168 hours.  CBC: No results for  input(s): WBC, NEUTROABS, HGB, HCT, MCV, PLT in the last 168 hours.  Cardiac Enzymes: No results for input(s): CKTOTAL, CKMB, CKMBINDEX, TROPONINI in the last 168 hours.  BNP: BNP (last 3 results) No results for input(s): BNP in the last 8760 hours.  ProBNP (last 3 results) No results for input(s): PROBNP in the last 8760 hours.   CBG: No results for input(s): GLUCAP in the last 168 hours.  Coagulation Studies: Recent Labs    07/16/24 0000  INR 1.7*    Imaging    No results found.  Patient Profile:   Joe [name] is a 54 y.o. male with h/o morbid obesity, DM2, HTN, HL, OSA, CAD s/p multiple stents to LAD and LCX  and systolic HF s/p Abbott CRT-D. Now s/p HM3 LVAD Implantation 11/14 by Dr. Fleeta Ochoa.  Assessment/Plan:    Pre-op optimization for scheduled L inguinal hernia - Hold warfarin - Scheduled for OR 07/21/24 - Goal INR per Dr. Ebbie <1.5. - Start hep gtt if INR <1.5.  Chronic Systolic HF s/p HM3 Implantation 11/14 - due to iCM - Echo 05/01/23: EF 20%, RV moderately down (improved) - RHC (9/24): elevated biventricular filling pressures with low PAPi; CI 2.2 - S/p HM III LVAD 05/03/23 by Dr. Obadiah - RAMP echo 11/21: Speeds left at 6000.  Speed increased to 6100 on 11/27. Speed increased to 6200 on 05/18/23.  - Continue sildenafil  and digoxin  for RV failure.  - Had been struggling with RV failure. VAD speed turned down to 5800. RHC 2/25 showed well compensated hemodynamics with dry weight 307-309  - Remains NYHA II-III (multifactorial) - Continues to struggle with his volume status likely due to a combination of RV failure and dietary indiscretion. Continue torsemide  60 daily. - Previously discussed Cardiomems - given majority of HF is now R-sided, not sure how much this will help but certainly an option if he is interested - Continue spiro 25 - Hold SGLT2i pre-op - Has been seen at [facility] and will consider for transplant  once BMI down but he is struggling to lose weight. Body mass index is 40.41 kg/m. No change at this time.     LVAD management - s/p HM-3 implant 05/03/23 - VAD interrogated personally. Parameters stable. - INR pending. Goal 2-3, goal for surgery <1.5. Holding warfarin for now.  - LDH pending - MAPs pending.    H/o VT Arrest and Hx PVCs - had VT arrest x 2 in 10/24 - Continue amiodarone  200 mg daily and Mexiletine 250 mg bid.  - Continue to wean amio as tolerated - Likely stop soon   CAD with chronic stable angina - s/p multiple stents to LAD and LCX - LHC (9/24) with stable 3v CAD - No s/s angina - Continue statin.    DM2 - Per PCP - Last A1c 8.5 in 9/25   OSA - Not using CPAP, says machine was recalled a while ago. - Needs new device   Morbid obesity - Continues  to gain weight despite Mounjaro (holding) - Body mass index is 40.41 kg/m. - Understands need to get BMI down near 35 for transplant, no change    I reviewed the LVAD parameters from today, and compared the results to the patient's prior recorded data.  No programming changes were made.  The LVAD is functioning within specified parameters.  The patient performs LVAD self-test daily.  LVAD interrogation was negative for any significant power changes, alarms or PI events/speed drops.  LVAD equipment check completed and is in good working order.  Back-up equipment present.   LVAD education done on emergency procedures and precautions and reviewed exit site care.  Length of Stay: 0  Beckey LITTIE Coe, NP 07/18/2024, 2:05 PM   VAD Team Pager [phone] (7am - 7am) +++VAD ISSUES ONLY+++  Advanced Heart Failure Team Pager [phone] (M-F; 7a - 5p)   Please visit [email or web address]: For overnight coverage please call cardiology fellow first. If fellow not available call Shock/ECMO MD on call.  For ECMO / Mechanical Support (Impella, IABP, LVAD) issues call Shock / ECMO MD on call.   Patient seen with NP, I formulated the plan and  agree with the above note.   Patient is admitted pre-inguinal hernia repair. He has been symptomatically stable.  Weight has been trending down, taking torsemide  60 mg daily.    General: Well appearing this am. NAD.  HEENT: Normal. Neck: Supple, JVP 7-8 cm. Carotids OK.  Cardiac:  Mechanical heart sounds with LVAD hum present.  Lungs:  CTAB, normal effort.  Abdomen:  NT, ND, no HSM. No bruits or masses. +BS  LVAD exit site: Well-healed and incorporated. Dressing dry and intact. No erythema or drainage. Stabilization device present and accurately applied. Driveline dressing changed daily per sterile technique. Extremities:  Warm and dry. No cyanosis, clubbing, rash, or edema.  Neuro:  Alert & oriented x 3. Cranial nerves grossly intact. Moves all 4 extremities w/o difficulty. Affect pleasant    Volume status looks good on torsemide  60 mg daily, will continue.   Need INR < 1.5 for surgery, waiting for labs today.  Can cover with low dose heparin  gtt when INR < 1.5.   Plan for left inguinal hernia repair on Monday.   Joe [name] 07/18/2024 2:21 PM     [1]  Allergies Allergen Reactions   Tramadol  Other (See Comments)    Hallucinations   "

## 2024-07-19 ENCOUNTER — Other Ambulatory Visit: Payer: Self-pay

## 2024-07-19 ENCOUNTER — Encounter (HOSPITAL_COMMUNITY): Payer: Self-pay | Admitting: Cardiology

## 2024-07-19 LAB — CBC
HCT: 43.1 % (ref 39.0–52.0)
Hemoglobin: 14.7 g/dL (ref 13.0–17.0)
MCH: 30.9 pg (ref 26.0–34.0)
MCHC: 34.1 g/dL (ref 30.0–36.0)
MCV: 90.7 fL (ref 80.0–100.0)
Platelets: 198 10*3/uL (ref 150–400)
RBC: 4.75 MIL/uL (ref 4.22–5.81)
RDW: 15 % (ref 11.5–15.5)
WBC: 6.1 10*3/uL (ref 4.0–10.5)
nRBC: 0 % (ref 0.0–0.2)

## 2024-07-19 LAB — BASIC METABOLIC PANEL WITH GFR
Anion gap: 13 (ref 5–15)
BUN: 16 mg/dL (ref 6–20)
CO2: 31 mmol/L (ref 22–32)
Calcium: 8.9 mg/dL (ref 8.9–10.3)
Chloride: 97 mmol/L — ABNORMAL LOW (ref 98–111)
Creatinine, Ser: 1.32 mg/dL — ABNORMAL HIGH (ref 0.61–1.24)
GFR, Estimated: >60 mL/min
Glucose, Bld: 139 mg/dL — ABNORMAL HIGH (ref 70–99)
Potassium: 3.4 mmol/L — ABNORMAL LOW (ref 3.5–5.1)
Sodium: 141 mmol/L (ref 135–145)

## 2024-07-19 LAB — GLUCOSE, CAPILLARY
Glucose-Capillary: 199 mg/dL — ABNORMAL HIGH (ref 70–99)
Glucose-Capillary: 234 mg/dL — ABNORMAL HIGH (ref 70–99)
Glucose-Capillary: 273 mg/dL — ABNORMAL HIGH (ref 70–99)
Glucose-Capillary: 281 mg/dL — ABNORMAL HIGH (ref 70–99)

## 2024-07-19 LAB — LACTATE DEHYDROGENASE: LDH: 204 U/L (ref 105–235)

## 2024-07-19 LAB — PROTIME-INR
INR: 1.7 — ABNORMAL HIGH (ref 0.8–1.2)
INR: 1.4 — ABNORMAL HIGH (ref 0.8–1.2)
Prothrombin Time: 20.5 s — ABNORMAL HIGH (ref 11.4–15.2)
Prothrombin Time: 18.3 s — ABNORMAL HIGH (ref 11.4–15.2)

## 2024-07-19 MED ORDER — HEPARIN (PORCINE) 25000 UT/250ML-% IV SOLN
500.0000 [IU]/h | INTRAVENOUS | Status: DC
  Administered 2024-07-19: 500 [IU]/h via INTRAVENOUS
  Filled 2024-07-19: qty 250

## 2024-07-19 MED ORDER — INSULIN GLARGINE-YFGN 100 UNIT/ML ~~LOC~~ SOLN
10.0000 [IU] | Freq: Every day | SUBCUTANEOUS | Status: DC
  Administered 2024-07-19 – 2024-07-21 (×3): 10 [IU] via SUBCUTANEOUS
  Filled 2024-07-19 (×4): qty 0.1

## 2024-07-19 MED ORDER — POTASSIUM CHLORIDE CRYS ER 20 MEQ PO TBCR
40.0000 meq | EXTENDED_RELEASE_TABLET | Freq: Once | ORAL | Status: AC
  Administered 2024-07-19: 40 meq via ORAL
  Filled 2024-07-19: qty 2

## 2024-07-19 NOTE — Plan of Care (Signed)

## 2024-07-19 NOTE — Progress Notes (Addendum)
 PHARMACY - ANTICOAGULATION CONSULT NOTE  Pharmacy Consult for heparin   Indication: LVAD  Allergies[1]  Patient Measurements: Height: 6' 3 (190.5 cm) Weight: (!) 143.7 kg (316 lb 12.8 oz) IBW/kg (Calculated) : 84.5 HEPARIN  DW (KG): 117.9  Vital Signs: Temp: 97.6 F (36.4 C) (01/31 0800) Temp Source: Oral (01/31 0800) BP: 86/71 (01/31 0800) Pulse Rate: 70 (01/31 0800)  Labs: Recent Labs    07/18/24 1445 07/19/24 0222  HGB 13.9 14.7  HCT 41.7 43.1  PLT 211 198  LABPROT 19.6* 20.5*  INR 1.6* 1.7*  CREATININE 1.28* 1.32*    Estimated Creatinine Clearance: 99 mL/min (A) (by C-G formula based on SCr of 1.32 mg/dL (H)).   Medical History: Past Medical History:  Diagnosis Date   Abnormal EKG    Acute systolic heart failure (HCC) 09/06/2017   Angina pectoris 09/05/2017   Body mass index 45.0-49.9, adult (HCC)    CAD S/P percutaneous coronary angioplasty 09/07/2017   mLAD and dLAD PCI with DES 09/06/17   CHF (congestive heart failure) (HCC)    Chronic systolic (congestive) heart failure (HCC) 03/19/2019   Complication of anesthesia 1995   had hard time waking up; breathing w/vasectomy   Coronary artery disease    Erectile dysfunction    Essential hypertension, benign    Fatigue    GERD (gastroesophageal reflux disease)    Gout    High cholesterol    just started tx yesterday (09/06/2017)   History of gout    RX prn (09/06/2017)   Ischemic cardiomyopathy 09/07/2017   EF 25-35%   Muscular chest pain    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   Below Left Nare (MOH's)   Obesity    Obstructive sleep apnea    OSA on CPAP    Plantar fasciitis    Pre-operative clearance 02/11/2018   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/02/2019   [facility]   Right bundle branch block    Shortness of breath 09/05/2017   Testosterone deficiency    Type 2 diabetes mellitus with complication, without long-term current use of insulin  (HCC)  09/05/2017   Type II diabetes mellitus (HCC)    started tx 08/28/2017    Assessment: 54 yo M with LVAD admitted for open inguinal hernia repair. Holding warfarin. Pharmacy consulted to start fixed rate heparin  when INR less than 1.5.   INR 1.7. continue to hold  Goal of Therapy:  INR 2-3  Monitor platelets by anticoagulation protocol: Yes   Plan:  F/u INR at 1700, start heparin  500 units/hr (fixed rate) when INR less than 1.5 Monitor daily INR, heparin  level, CBC, signs/symptoms of bleeding  F/u restart warfarin   Jinnie Door, PharmD, BCPS, [facility] Clinical Pharmacist  Please check AMION for all [facility] Pharmacy phone numbers After 10:00 PM, call Main Pharmacy [phone]   ADDENDUM: INR 1.4 - will begin heparin  as above.  Rocky Slade, PharmD, BCPS 07/19/2024 6:19 PM       [1]  Allergies Allergen Reactions   Tramadol  Other (See Comments)    Hallucinations

## 2024-07-19 NOTE — Progress Notes (Signed)
 Patient ID: Joe [name], male   DOB: February 21, 1971, 54 y.o.   MRN: [id number]     Advanced Heart Failure Rounding Note  Cardiologist: Lamar Fitch, MD       No complaints today.  INR 1.7.  MAP 70s-80s.   LVAD Interrogation HM 3: Speed: 5800 Flow: 5.8 PI: 2.9 Power: 4.7.    Objective:   Weight Range: (!) 143.7 kg Body mass index is 39.6 kg/m.   Vital Signs:   Temp:  [97.6 F (36.4 C)-98.5 F (36.9 C)] 98.5 F (36.9 C) (01/31 1055) Pulse Rate:  [64-70] 69 (01/31 1055) Resp:  [11-19] 17 (01/31 0800) BP: (86-117)/(71-93) 101/88 (01/31 1055) SpO2:  [93 %-100 %] 96 % (01/31 1055) Weight:  [143.7 kg-146.6 kg] 143.7 kg (01/31 0600)    Weight change: Filed Weights   07/18/24 1243 07/19/24 0600  Weight: (!) 146.6 kg (!) 143.7 kg    Intake/Output:   Intake/Output Summary (Last 24 hours) at 07/19/2024 1143 Last data filed at 07/19/2024 0609 Gross per 24 hour  Intake 540 ml  Output 1 ml  Net 539 ml     Physical Exam   General: Well appearing this am. NAD.  HEENT: Normal. Neck: Supple, JVP 7-8 cm. Carotids OK.  Cardiac:  Mechanical heart sounds with LVAD hum present.  Lungs:  CTAB, normal effort.  Abdomen:  NT, ND, no HSM. No bruits or masses. +BS  LVAD exit site: Well-healed and incorporated. Dressing dry and intact. No erythema or drainage. Stabilization device present and accurately applied. Driveline dressing changed daily per sterile technique. Extremities:  Warm and dry. No cyanosis, clubbing, rash, or edema.  Neuro:  Alert & oriented x 3. Cranial nerves grossly intact. Moves all 4 extremities w/o difficulty. Affect pleasant    Telemetry   NSR 70s (personally reviewed)  Labs   CBC Recent Labs    07/18/24 1445 07/19/24 0222  WBC 5.5 6.1  NEUTROABS 3.7  --   HGB 13.9 14.7  HCT 41.7 43.1  MCV 93.1 90.7  PLT 211 198   Basic Metabolic Panel Recent Labs    98/69/73 1445 07/19/24 0222  NA 140 141  K 4.2 3.4*  CL 101 97*  CO2 28 31   GLUCOSE 196* 139*  BUN 14 16  CREATININE 1.28* 1.32*  CALCIUM  8.7* 8.9  MG 2.2  --    Liver Function Tests No results for input(s): AST, ALT, ALKPHOS, BILITOT, PROT, ALBUMIN  in the last 72 hours. No results for input(s): LIPASE, AMYLASE in the last 72 hours. Cardiac Enzymes No results for input(s): CKTOTAL, CKMB, CKMBINDEX, TROPONINI in the last 72 hours.  BNP: BNP (last 3 results) No results for input(s): BNP in the last 8760 hours.  ProBNP (last 3 results) No results for input(s): PROBNP in the last 8760 hours.   D-Dimer No results for input(s): DDIMER in the last 72 hours. Hemoglobin A1C Recent Labs    07/18/24 1445  HGBA1C 8.0*   Fasting Lipid Panel No results for input(s): CHOL, HDL, LDLCALC, TRIG, CHOLHDL, LDLDIRECT in the last 72 hours. Medications:   Scheduled Medications:  allopurinol   100 mg Oral q AM   amiodarone   200 mg Oral Daily   digoxin   0.125 mg Oral Daily   escitalopram   10 mg Oral QHS   ezetimibe   10 mg Oral Daily   gabapentin   300 mg Oral TID   insulin  aspart  0-15 Units Subcutaneous TID WC   insulin  aspart  0-5 Units Subcutaneous QHS  insulin  glargine-yfgn  10 Units Subcutaneous Daily   levothyroxine   25 mcg Oral QAC breakfast   mexiletine  250 mg Oral BID   multivitamin with minerals  1 tablet Oral q AM   pantoprazole   40 mg Oral Daily   potassium chloride   40 mEq Oral Once   ranolazine   500 mg Oral Daily   rosuvastatin   40 mg Oral Daily   sildenafil   20 mg Oral TID   spironolactone   25 mg Oral Daily   torsemide   60 mg Oral Daily    Infusions:  [START ON 07/20/2024]  ceFAZolin  (ANCEF ) IV      PRN Medications: acetaminophen , ALPRAZolam   Assessment/Plan   Pre-op optimization for scheduled L inguinal hernia - Hold warfarin - Scheduled for OR 07/21/24 - Goal INR per Dr. Ebbie <1.5. - Start heparin  gtt if INR <1.5 (1.7 today).   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 Obadiah - RAMP echo 11/21: Speeds left at 6000.  Speed increased to 6100 on 11/27. Speed increased to 6200 on 05/18/23.  - Continue sildenafil  and digoxin  for RV failure.  - Had been struggling with RV failure. VAD speed turned down to 5800. RHC 2/25 showed well compensated hemodynamics with dry weight 307-309  - Remains NYHA II-III (multifactorial) - Continues to struggle with his volume status likely due to a combination of RV failure and dietary indiscretion. Volume status looks ok today.  Continue torsemide  60 daily. - Continue spiro 25 - Hold SGLT2i pre-op - Has been seen at [facility] and will consider for transplant once BMI down but he is struggling to lose weight. Body mass index is 40.41 kg/m. No change at this time.     LVAD management - s/p HM-3 implant 05/03/23 - VAD interrogated personally. Parameters stable. - INR 1.7. Goal 2-3, goal for surgery <1.5. Holding warfarin for now.  - LDH pending - MAP 70s-80s    H/o VT Arrest and Hx PVCs - had VT arrest x 2 in 10/24 - Continue amiodarone  200 mg daily and Mexiletine 250 mg bid.    CAD with chronic stable angina - s/p multiple stents to LAD and LCX - LHC (9/24) with stable 3v CAD - No chest pain.  - Continue statin.    DM2 - SSI   OSA - Not using CPAP, says machine was recalled a while ago. - Needs new device   Morbid obesity - Continues to gain weight despite Mounjaro (holding) - Body mass index is 40.41 kg/m. - Understands need to get BMI down near 35 for transplant, no change     I reviewed the LVAD parameters from today, and compared the results to the patient's prior recorded data.  No programming changes were made.  The LVAD is functioning within specified parameters.  The patient performs LVAD self-test daily.  LVAD interrogation was negative  for any significant power changes, alarms or PI events/speed drops.  LVAD equipment check completed and is in good working order.  Back-up equipment present.   LVAD education done on emergency procedures and precautions and reviewed exit site care.  Length of Stay: 1  Ezra Shuck, MD  07/19/2024, 11:43 AM  Advanced Heart Failure Team Pager [phone] (M-F; 7a - 5p)   Please visit [email or web address]: For overnight coverage please call cardiology fellow first. If fellow  not available call Shock/ECMO MD on call.  For ECMO / Mechanical Support (Impella, IABP, LVAD) issues call Shock / ECMO MD on call.

## 2024-07-20 LAB — CBC
HCT: 42.4 % (ref 39.0–52.0)
Hemoglobin: 14.6 g/dL (ref 13.0–17.0)
MCH: 31.1 pg (ref 26.0–34.0)
MCHC: 34.4 g/dL (ref 30.0–36.0)
MCV: 90.2 fL (ref 80.0–100.0)
Platelets: 227 10*3/uL (ref 150–400)
RBC: 4.7 MIL/uL (ref 4.22–5.81)
RDW: 14.7 % (ref 11.5–15.5)
WBC: 6.4 10*3/uL (ref 4.0–10.5)
nRBC: 0 % (ref 0.0–0.2)

## 2024-07-20 LAB — GLUCOSE, CAPILLARY
Glucose-Capillary: 185 mg/dL — ABNORMAL HIGH (ref 70–99)
Glucose-Capillary: 214 mg/dL — ABNORMAL HIGH (ref 70–99)
Glucose-Capillary: 195 mg/dL — ABNORMAL HIGH (ref 70–99)
Glucose-Capillary: 211 mg/dL — ABNORMAL HIGH (ref 70–99)

## 2024-07-20 LAB — BASIC METABOLIC PANEL WITH GFR
Anion gap: 14 (ref 5–15)
BUN: 20 mg/dL (ref 6–20)
CO2: 29 mmol/L (ref 22–32)
Calcium: 8.9 mg/dL (ref 8.9–10.3)
Chloride: 97 mmol/L — ABNORMAL LOW (ref 98–111)
Creatinine, Ser: 1.37 mg/dL — ABNORMAL HIGH (ref 0.61–1.24)
GFR, Estimated: >60 mL/min
Glucose, Bld: 175 mg/dL — ABNORMAL HIGH (ref 70–99)
Potassium: 3.6 mmol/L (ref 3.5–5.1)
Sodium: 139 mmol/L (ref 135–145)

## 2024-07-20 LAB — LACTATE DEHYDROGENASE: LDH: 222 U/L (ref 105–235)

## 2024-07-20 LAB — PROTIME-INR
INR: 1.5 — ABNORMAL HIGH (ref 0.8–1.2)
Prothrombin Time: 18.5 s — ABNORMAL HIGH (ref 11.4–15.2)

## 2024-07-20 LAB — HEPARIN LEVEL (UNFRACTIONATED): Heparin Unfractionated: <0.1 [IU]/mL — ABNORMAL LOW (ref 0.30–0.70)

## 2024-07-20 MED ORDER — INSULIN GLARGINE-YFGN 100 UNIT/ML ~~LOC~~ SOLN
10.0000 [IU] | Freq: Once | SUBCUTANEOUS | Status: AC
  Administered 2024-07-20: 10 [IU] via SUBCUTANEOUS
  Filled 2024-07-20: qty 0.1

## 2024-07-20 MED ORDER — POTASSIUM CHLORIDE CRYS ER 20 MEQ PO TBCR: 40.0000 meq | EXTENDED_RELEASE_TABLET | ORAL | Status: DC

## 2024-07-20 MED ORDER — POTASSIUM CHLORIDE CRYS ER 20 MEQ PO TBCR
40.0000 meq | EXTENDED_RELEASE_TABLET | Freq: Once | ORAL | Status: AC
  Administered 2024-07-20: 40 meq via ORAL
  Filled 2024-07-20: qty 2

## 2024-07-20 NOTE — Anesthesia Preprocedure Evaluation (Signed)
 "                                  Anesthesia Evaluation  Patient identified by MRN, date of birth, ID band Patient awake    Reviewed: Allergy & Precautions, NPO status , Patient's Chart, lab work & pertinent test results  History of Anesthesia Complications Negative for: history of anesthetic complications  Airway Mallampati: II  TM Distance: >3 FB Neck ROM: Full    Dental  (+) Caps, Teeth Intact   Pulmonary sleep apnea    Pulmonary exam normal        Cardiovascular hypertension, + angina (chronic Stable angina)  + Cardiac Stents and +CHF (Chronic Systolic HF s/p HM3 Implantation 11/14 , on jardiance )  + dysrhythmias (V Tach arrst in 2024 on amiodarne) Ventricular Tachycardia + Cardiac Defibrillator  Rhythm:Regular Rate:Normal  Ischemic Cardiomyopathy 2024 TTE EF 20%   Mechanical heart sounds   Neuro/Psych    GI/Hepatic ,GERD  Medicated and Controlled,,  Endo/Other  diabetes, Type 2    Renal/GU Renal diseaseLab Results      Component                Value               Date                             K                        3.6                 07/20/2024                     CREATININE               1.37 (H)            07/20/2024                GFRNONAA                 >60                 07/20/2024                   ALBUMIN                   3.4 (L)             04/30/2024                GLUCOSE                  175 (H)             07/20/2024                Musculoskeletal   Abdominal  (+) + obese  Peds  Hematology On coumadin  for INR < 1.5 w heparin  Bridge  Lab Results      Component                Value               Date                      WBC  6.4                 07/20/2024                HGB                      14.6                07/20/2024                HCT                      42.4                07/20/2024                MCV                      90.2                07/20/2024                PLT                       227                 07/20/2024              Anesthesia Other Findings   Reproductive/Obstetrics                              Anesthesia Physical Anesthesia Plan  ASA: 4  Anesthesia Plan: General and Regional   Post-op Pain Management: Regional block*, Tylenol  PO (pre-op)*, Precedex  and Toradol IV (intra-op)*   Induction: Intravenous  PONV Risk Score and Plan: 3 and Midazolam , Ondansetron  and Treatment may vary due to age or medical condition  Airway Management Planned: Oral ETT  Additional Equipment: ClearSight  Intra-op Plan:   Post-operative Plan: Extubation in OR  Informed Consent: I have reviewed the patients History and Physical, chart, labs and discussed the procedure including the risks, benefits and alternatives for the proposed anesthesia with the patient or authorized representative who has indicated his/her understanding and acceptance.     Dental advisory given  Plan Discussed with:   Anesthesia Plan Comments: (Pt on LVAD 20% EF clear site   GA w L TAP block)         Anesthesia Quick Evaluation  "

## 2024-07-20 NOTE — Progress Notes (Addendum)
 Patient ID: Joe [name], male   DOB: [birth year], 54 y.o.   MRN: [id number]     Advanced Heart Failure Rounding Note  Cardiologist: Lamar Fitch, MD       No complaints today.  INR 1.5.  MAP 80s.   LVAD Interrogation HM 3: Speed: 5800 Flow: 5.8 PI: 2.7 Power: 5.    Objective:   Weight Range: (!) 144.8 kg Body mass index is 39.9 kg/m.   Vital Signs:   Temp:  [97.6 F (36.4 C)-98.5 F (36.9 C)] 97.6 F (36.4 C) (02/01 1214) Pulse Rate:  [66-72] 72 (02/01 1214) Resp:  [14-20] 14 (02/01 1214) BP: (79-118)/(65-91) 92/72 (02/01 1214) SpO2:  [92 %-96 %] 92 % (02/01 1214) Weight:  [144.8 kg] 144.8 kg (02/01 0300)    Weight change: Filed Weights   07/18/24 1243 07/19/24 0600 07/20/24 0300  Weight: (!) 146.6 kg (!) 143.7 kg (!) 144.8 kg    Intake/Output:   Intake/Output Summary (Last 24 hours) at 07/20/2024 1239 Last data filed at 07/20/2024 0608 Gross per 24 hour  Intake 1124.22 ml  Output --  Net 1124.22 ml     Physical Exam   General: Well appearing this am. NAD.  HEENT: Normal. Neck: Supple, JVP 7-8 cm. Carotids OK.  Cardiac:  Mechanical heart sounds with LVAD hum present.  Lungs:  CTAB, normal effort.  Abdomen:  NT, ND, no HSM. No bruits or masses. +BS  LVAD exit site: Well-healed and incorporated. Dressing dry and intact. No erythema or drainage. Stabilization device present and accurately applied. Driveline dressing changed daily per sterile technique. Extremities:  Warm and dry. No cyanosis, clubbing, rash, or edema.  Neuro:  Alert & oriented x 3. Cranial nerves grossly intact. Moves all 4 extremities w/o difficulty. Affect pleasant    Telemetry   NSR 70s (personally reviewed)  Labs   CBC Recent Labs    07/18/24 1445 07/19/24 0222 07/20/24 0300  WBC 5.5 6.1 6.4  NEUTROABS 3.7  --   --   HGB 13.9 14.7 14.6  HCT 41.7 43.1 42.4  MCV 93.1 90.7 90.2  PLT 211 198 227   Basic Metabolic Panel Recent Labs    98/69/73 1445 07/19/24 0222  07/20/24 0300  NA 140 141 139  K 4.2 3.4* 3.6  CL 101 97* 97*  CO2 28 31 29   GLUCOSE 196* 139* 175*  BUN 14 16 20   CREATININE 1.28* 1.32* 1.37*  CALCIUM  8.7* 8.9 8.9  MG 2.2  --   --    Liver Function Tests No results for input(s): AST, ALT, ALKPHOS, BILITOT, PROT, ALBUMIN  in the last 72 hours. No results for input(s): LIPASE, AMYLASE in the last 72 hours. Cardiac Enzymes No results for input(s): CKTOTAL, CKMB, CKMBINDEX, TROPONINI in the last 72 hours.  BNP: BNP (last 3 results) No results for input(s): BNP in the last 8760 hours.  ProBNP (last 3 results) No results for input(s): PROBNP in the last 8760 hours.   D-Dimer No results for input(s): DDIMER in the last 72 hours. Hemoglobin A1C Recent Labs    07/18/24 1445  HGBA1C 8.0*   Fasting Lipid Panel No results for input(s): CHOL, HDL, LDLCALC, TRIG, CHOLHDL, LDLDIRECT in the last 72 hours. Medications:   Scheduled Medications:  allopurinol   100 mg Oral q AM   amiodarone   200 mg Oral Daily   digoxin   0.125 mg Oral Daily   escitalopram   10 mg Oral QHS   ezetimibe   10 mg Oral Daily   gabapentin   300 mg Oral TID   insulin  aspart  0-15 Units Subcutaneous TID WC   insulin  aspart  0-5 Units Subcutaneous QHS   insulin  glargine-yfgn  10 Units Subcutaneous Daily   insulin  glargine-yfgn  10 Units Subcutaneous Once   levothyroxine   25 mcg Oral QAC breakfast   mexiletine  250 mg Oral BID   multivitamin with minerals  1 tablet Oral q AM   pantoprazole   40 mg Oral Daily   potassium chloride   40 mEq Oral Once   ranolazine   500 mg Oral Daily   rosuvastatin   40 mg Oral Daily   sildenafil   20 mg Oral TID   spironolactone   25 mg Oral Daily   torsemide   60 mg Oral Daily    Infusions:   ceFAZolin  (ANCEF ) IV     heparin  500 Units/hr (07/20/24 0400)    PRN Medications: acetaminophen , ALPRAZolam   Assessment/Plan   Pre-op optimization for scheduled L inguinal hernia - Hold  warfarin - Scheduled for OR 07/21/24 - Goal INR per Dr. Ebbie <1.5. - Start heparin  gtt today with INR 1.5.    Chronic Systolic HF s/p HM3 Implantation 11/14 - due to iCM - Echo 05/01/23: EF 20%, RV moderately down (improved) - RHC (9/24): elevated biventricular filling pressures with low PAPi; CI 2.2 - S/p HM III LVAD 05/03/23 by Dr. Obadiah - RAMP echo 11/21: Speeds left at 6000.  Speed increased to 6100 on 11/27. Speed increased to 6200 on 05/18/23.  - Continue sildenafil  and digoxin  for RV failure.  - Had been struggling with RV failure. VAD speed turned down to 5800. RHC 2/25 showed well compensated hemodynamics with dry weight 307-309  - Remains NYHA II-III (multifactorial) - Continues to struggle with his volume status likely due to a combination of RV failure and dietary indiscretion. Volume status looks ok today.  Continue torsemide  60 daily. - Continue spiro 25 - Hold SGLT2i pre-op - Has been seen at [facility] and will consider for transplant once BMI down but he is struggling to lose weight. Body mass index is 40.41 kg/m. No change at this time.     LVAD management - s/p HM-3 implant 05/03/23 - VAD interrogated personally. Parameters stable. - INR 1.5. Goal 2-3, goal for surgery <1.5. Holding warfarin for now.  - LDH pending - MAP 70s-80s    H/o VT Arrest and Hx PVCs - had VT arrest x 2 in 10/24 - Continue amiodarone  200 mg daily and Mexiletine 250 mg bid.    CAD with chronic stable angina - s/p multiple stents to LAD and LCX - LHC (9/24) with stable 3v CAD - No chest pain.  - Continue statin.    DM2 - SSI   OSA - Not using CPAP, says machine was recalled a while ago. - Needs new device   Morbid obesity - Continues to gain weight despite Mounjaro (holding) - Body mass index is 40.41 kg/m. - Understands need to get BMI down near 35 for transplant, no change     I reviewed the LVAD parameters from today, and compared the results to the patient's prior  recorded data.  No programming changes were made.  The LVAD is functioning within specified parameters.  The patient performs LVAD self-test daily.  LVAD interrogation was negative for any significant power changes, alarms or PI events/speed drops.  LVAD equipment check completed and is in good working order.  Back-up equipment present.   LVAD education done on emergency procedures and precautions and reviewed exit site care.  Length of Stay: 2  Ezra Shuck, MD  07/20/2024, 12:39 PM  Advanced Heart Failure Team Pager [phone] (M-F; 7a - 5p)   Please visit [email or web address]: For overnight coverage please call cardiology fellow first. If fellow not available call Shock/ECMO MD on call.  For ECMO / Mechanical Support (Impella, IABP, LVAD) issues call Shock / ECMO MD on call.

## 2024-07-20 NOTE — Progress Notes (Signed)
 PHARMACY - ANTICOAGULATION CONSULT NOTE  Pharmacy Consult for heparin   Indication: LVAD  Allergies[1]  Patient Measurements: Height: 6' 3 (190.5 cm) Weight: (!) 144.8 kg (319 lb 3.6 oz) IBW/kg (Calculated) : 84.5 HEPARIN  DW (KG): 117.9  Vital Signs: Temp: 98.5 F (36.9 C) (02/01 0737) Temp Source: Oral (02/01 0737) BP: 113/85 (02/01 0737) Pulse Rate: 71 (02/01 0737)  Labs: Recent Labs    07/18/24 1445 07/19/24 0222 07/19/24 1751 07/20/24 0300  HGB 13.9 14.7  --  14.6  HCT 41.7 43.1  --  42.4  PLT 211 198  --  227  LABPROT 19.6* 20.5* 18.3* 18.5*  INR 1.6* 1.7* 1.4* 1.5*  HEPARINUNFRC  --   --   --  <0.10*  CREATININE 1.28* 1.32*  --  1.37*    Estimated Creatinine Clearance: 95.8 mL/min (A) (by C-G formula based on SCr of 1.37 mg/dL (H)).   Medical History: Past Medical History:  Diagnosis Date   Abnormal EKG    Acute systolic heart failure (HCC) 09/06/2017   Angina pectoris 09/05/2017   Body mass index 45.0-49.9, adult (HCC)    CAD S/P percutaneous coronary angioplasty 09/07/2017   mLAD and dLAD PCI with DES 09/06/17   CHF (congestive heart failure) (HCC)    Chronic systolic (congestive) heart failure (HCC) 03/19/2019   Complication of anesthesia 1995   had hard time waking up; breathing w/vasectomy   Coronary artery disease    Erectile dysfunction    Essential hypertension, benign    Fatigue    GERD (gastroesophageal reflux disease)    Gout    High cholesterol    just started tx yesterday (09/06/2017)   History of gout    RX prn (09/06/2017)   Ischemic cardiomyopathy 09/07/2017   EF 25-35%   Muscular chest pain    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   Below Left Nare (MOH's)   Obesity    Obstructive sleep apnea    OSA on CPAP    Plantar fasciitis    Pre-operative clearance 02/11/2018   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/02/2019   [employer]   Right bundle branch block    Shortness of breath  09/05/2017   Testosterone deficiency    Type 2 diabetes mellitus with complication, without long-term current use of insulin  (HCC) 09/05/2017   Type II diabetes mellitus (HCC)    started tx 08/28/2017    Assessment: 54 yo M with LVAD admitted for open inguinal hernia repair. Holding warfarin. Pharmacy consulted to start fixed rate heparin  when INR less than 1.5.   INR 1.5. Heparin  level < 0.1 on heparin  at 500 units/hr no titration.  Goal of Therapy:  INR 2-3  Monitor platelets by anticoagulation protocol: Yes   Plan:  Heparin  500 units/hr (fixed rate)  Monitor daily INR, heparin  level, CBC, signs/symptoms of bleeding  F/u restart warfarin   Jinnie Door, PharmD, BCPS, BCCP Clinical Pharmacist  Please check AMION for all [facility] Pharmacy phone numbers After 10:00 PM, call Main Pharmacy [phone]      [1]  Allergies Allergen Reactions   Tramadol  Other (See Comments)    Hallucinations

## 2024-07-20 NOTE — Plan of Care (Signed)
   Problem: Education: Goal: Knowledge of General Education information will improve Description: Including pain rating scale, medication(s)/side effects and non-pharmacologic comfort measures Outcome: Progressing   Problem: Health Behavior/Discharge Planning: Goal: Ability to manage health-related needs will improve Outcome: Progressing   Problem: Activity: Goal: Risk for activity intolerance will decrease Outcome: Progressing

## 2024-07-21 ENCOUNTER — Encounter (HOSPITAL_COMMUNITY): Payer: Self-pay | Admitting: Anesthesiology

## 2024-07-21 ENCOUNTER — Encounter (HOSPITAL_COMMUNITY): Admission: RE | Disposition: A | Payer: Self-pay | Source: Home / Self Care | Attending: Cardiology

## 2024-07-21 ENCOUNTER — Inpatient Hospital Stay (HOSPITAL_COMMUNITY): Admission: RE | Admit: 2024-07-21 | Admitting: General Surgery

## 2024-07-21 ENCOUNTER — Encounter (HOSPITAL_COMMUNITY): Payer: Self-pay | Admitting: Cardiology

## 2024-07-21 LAB — CBC
HCT: 41.5 % (ref 39.0–52.0)
Hemoglobin: 14.1 g/dL (ref 13.0–17.0)
MCH: 30.8 pg (ref 26.0–34.0)
MCHC: 34 g/dL (ref 30.0–36.0)
MCV: 90.6 fL (ref 80.0–100.0)
Platelets: 215 10*3/uL (ref 150–400)
RBC: 4.58 MIL/uL (ref 4.22–5.81)
RDW: 14.7 % (ref 11.5–15.5)
WBC: 7 10*3/uL (ref 4.0–10.5)
nRBC: 0 % (ref 0.0–0.2)

## 2024-07-21 LAB — BASIC METABOLIC PANEL WITH GFR
Anion gap: 13 (ref 5–15)
BUN: 20 mg/dL (ref 6–20)
CO2: 27 mmol/L (ref 22–32)
Calcium: 8.6 mg/dL — ABNORMAL LOW (ref 8.9–10.3)
Chloride: 97 mmol/L — ABNORMAL LOW (ref 98–111)
Creatinine, Ser: 1.43 mg/dL — ABNORMAL HIGH (ref 0.61–1.24)
GFR, Estimated: 59 mL/min — ABNORMAL LOW
Glucose, Bld: 208 mg/dL — ABNORMAL HIGH (ref 70–99)
Potassium: 3.8 mmol/L (ref 3.5–5.1)
Sodium: 137 mmol/L (ref 135–145)

## 2024-07-21 LAB — GLUCOSE, CAPILLARY
Glucose-Capillary: 197 mg/dL — ABNORMAL HIGH (ref 70–99)
Glucose-Capillary: 177 mg/dL — ABNORMAL HIGH (ref 70–99)
Glucose-Capillary: 211 mg/dL — ABNORMAL HIGH (ref 70–99)
Glucose-Capillary: 247 mg/dL — ABNORMAL HIGH (ref 70–99)
Glucose-Capillary: 239 mg/dL — ABNORMAL HIGH (ref 70–99)
Glucose-Capillary: 273 mg/dL — ABNORMAL HIGH (ref 70–99)

## 2024-07-21 LAB — LACTATE DEHYDROGENASE: LDH: 215 U/L (ref 105–235)

## 2024-07-21 LAB — HEPARIN LEVEL (UNFRACTIONATED): Heparin Unfractionated: <0.1 [IU]/mL — ABNORMAL LOW (ref 0.30–0.70)

## 2024-07-21 LAB — PROTIME-INR
INR: 1.3 — ABNORMAL HIGH (ref 0.8–1.2)
Prothrombin Time: 17 s — ABNORMAL HIGH (ref 11.4–15.2)

## 2024-07-21 MED ORDER — LACTATED RINGERS IV SOLN: INTRAVENOUS | Status: DC

## 2024-07-21 MED ORDER — BUPIVACAINE-EPINEPHRINE (PF) 0.25% -1:200000 IJ SOLN
INTRAMUSCULAR | Status: AC
  Filled 2024-07-21: qty 30

## 2024-07-21 MED ORDER — SUGAMMADEX SODIUM 200 MG/2ML IV SOLN
INTRAVENOUS | Status: DC | PRN
  Administered 2024-07-21: 400 mg via INTRAVENOUS

## 2024-07-21 MED ORDER — CHLORHEXIDINE GLUCONATE 0.12 % MT SOLN
OROMUCOSAL | Status: AC
  Administered 2024-07-21: 15 mL via OROMUCOSAL
  Filled 2024-07-21: qty 15

## 2024-07-21 MED ORDER — SODIUM CHLORIDE 0.9 % IV SOLN: INTRAVENOUS | Status: DC | PRN

## 2024-07-21 MED ORDER — METHOCARBAMOL 500 MG PO TABS
500.0000 mg | ORAL_TABLET | Freq: Three times a day (TID) | ORAL | Status: DC | PRN
  Administered 2024-07-21 – 2024-07-25 (×8): 500 mg via ORAL
  Filled 2024-07-21 (×8): qty 1

## 2024-07-21 MED ORDER — EPINEPHRINE 1 MG/10ML IV SOSY
PREFILLED_SYRINGE | INTRAVENOUS | Status: AC
  Filled 2024-07-21: qty 20

## 2024-07-21 MED ORDER — CHLORHEXIDINE GLUCONATE CLOTH 2 % EX PADS
6.0000 | MEDICATED_PAD | Freq: Every day | CUTANEOUS | Status: DC
  Administered 2024-07-22 – 2024-07-23 (×2): 6 via TOPICAL

## 2024-07-21 MED ORDER — MIDAZOLAM HCL (PF) 2 MG/2ML IJ SOLN
INTRAMUSCULAR | Status: DC | PRN
  Administered 2024-07-21: 1 mg via INTRAVENOUS

## 2024-07-21 MED ORDER — DEXMEDETOMIDINE HCL IN NACL 80 MCG/20ML IV SOLN
INTRAVENOUS | Status: DC | PRN
  Administered 2024-07-21 (×6): 4 ug via INTRAVENOUS

## 2024-07-21 MED ORDER — LIDOCAINE 2% (20 MG/ML) 5 ML SYRINGE
INTRAMUSCULAR | Status: DC | PRN
  Administered 2024-07-21: 100 mg via INTRAVENOUS

## 2024-07-21 MED ORDER — MIDAZOLAM HCL 2 MG/2ML IJ SOLN
INTRAMUSCULAR | Status: AC
  Filled 2024-07-21: qty 2

## 2024-07-21 MED ORDER — DEXAMETHASONE SOD PHOSPHATE PF 10 MG/ML IJ SOLN
INTRAMUSCULAR | Status: AC
  Filled 2024-07-21: qty 1

## 2024-07-21 MED ORDER — PHENYLEPHRINE HCL (PRESSORS) 10 MG/ML IV SOLN
INTRAVENOUS | Status: AC
  Filled 2024-07-21: qty 1

## 2024-07-21 MED ORDER — 0.9 % SODIUM CHLORIDE (POUR BTL) OPTIME
TOPICAL | Status: DC | PRN
  Administered 2024-07-21: 1000 mL

## 2024-07-21 MED ORDER — PROPOFOL 10 MG/ML IV BOLUS
INTRAVENOUS | Status: AC
  Filled 2024-07-21: qty 20

## 2024-07-21 MED ORDER — ROPIVACAINE HCL 5 MG/ML IJ SOLN
INTRAMUSCULAR | Status: DC | PRN
  Administered 2024-07-21: 30 mL

## 2024-07-21 MED ORDER — INSULIN ASPART 100 UNIT/ML IJ SOLN
0.0000 [IU] | INTRAMUSCULAR | Status: DC | PRN
  Administered 2024-07-21: 2 [IU] via SUBCUTANEOUS

## 2024-07-21 MED ORDER — PHENYLEPHRINE 80 MCG/ML (10ML) SYRINGE FOR IV PUSH (FOR BLOOD PRESSURE SUPPORT)
PREFILLED_SYRINGE | INTRAVENOUS | Status: AC
  Filled 2024-07-21: qty 10

## 2024-07-21 MED ORDER — FENTANYL CITRATE (PF) 50 MCG/ML IJ SOSY
25.0000 ug | PREFILLED_SYRINGE | INTRAMUSCULAR | Status: AC | PRN
  Administered 2024-07-21 (×2): 25 ug via INTRAVENOUS
  Filled 2024-07-21 (×2): qty 1

## 2024-07-21 MED ORDER — CEFAZOLIN SODIUM-DEXTROSE 3-4 GM/150ML-% IV SOLN
INTRAVENOUS | Status: AC
  Filled 2024-07-21: qty 150

## 2024-07-21 MED ORDER — BUPIVACAINE-EPINEPHRINE 0.25% -1:200000 IJ SOLN
INTRAMUSCULAR | Status: DC | PRN
  Administered 2024-07-21: 10 mL

## 2024-07-21 MED ORDER — PROPOFOL 10 MG/ML IV BOLUS
INTRAVENOUS | Status: DC | PRN
  Administered 2024-07-21 (×2): 50 ug via INTRAVENOUS

## 2024-07-21 MED ORDER — ORAL CARE MOUTH RINSE: 15.0000 mL | Freq: Once | OROMUCOSAL | Status: AC

## 2024-07-21 MED ORDER — HEMOSTATIC AGENTS (NO CHARGE) OPTIME
TOPICAL | Status: DC | PRN
  Administered 2024-07-21 (×2): 1

## 2024-07-21 MED ORDER — LIDOCAINE 2% (20 MG/ML) 5 ML SYRINGE
INTRAMUSCULAR | Status: AC
  Filled 2024-07-21: qty 5

## 2024-07-21 MED ORDER — PHENYLEPHRINE 80 MCG/ML (10ML) SYRINGE FOR IV PUSH (FOR BLOOD PRESSURE SUPPORT)
PREFILLED_SYRINGE | INTRAVENOUS | Status: DC | PRN
  Administered 2024-07-21: 80 ug via INTRAVENOUS
  Administered 2024-07-21 (×2): 40 ug via INTRAVENOUS

## 2024-07-21 MED ORDER — FENTANYL CITRATE (PF) 100 MCG/2ML IJ SOLN
INTRAMUSCULAR | Status: AC
  Filled 2024-07-21: qty 2

## 2024-07-21 MED ORDER — VASOPRESSIN 20 UNIT/ML IV SOLN
INTRAVENOUS | Status: AC
  Filled 2024-07-21: qty 1

## 2024-07-21 MED ORDER — CEFAZOLIN SODIUM-DEXTROSE 3-4 GM/150ML-% IV SOLN
3.0000 g | INTRAVENOUS | Status: AC
  Administered 2024-07-21: 3 g via INTRAVENOUS

## 2024-07-21 MED ORDER — ROCURONIUM BROMIDE 100 MG/10ML IV SOLN
INTRAVENOUS | Status: DC | PRN
  Administered 2024-07-21: 10 mg via INTRAVENOUS
  Administered 2024-07-21: 20 mg via INTRAVENOUS
  Administered 2024-07-21: 70 mg via INTRAVENOUS

## 2024-07-21 MED ORDER — CHLORHEXIDINE GLUCONATE 0.12 % MT SOLN: 15.0000 mL | Freq: Once | OROMUCOSAL | Status: AC

## 2024-07-21 MED ORDER — ONDANSETRON HCL 4 MG/2ML IJ SOLN
INTRAMUSCULAR | Status: AC
  Filled 2024-07-21: qty 2

## 2024-07-21 MED ORDER — ROCURONIUM BROMIDE 10 MG/ML (PF) SYRINGE
PREFILLED_SYRINGE | INTRAVENOUS | Status: AC
  Filled 2024-07-21: qty 10

## 2024-07-21 MED ORDER — DEXAMETHASONE SOD PHOSPHATE PF 10 MG/ML IJ SOLN
INTRAMUSCULAR | Status: DC | PRN
  Administered 2024-07-21: 4 mg via INTRAVENOUS

## 2024-07-21 MED ORDER — DEXMEDETOMIDINE HCL IN NACL 80 MCG/20ML IV SOLN
INTRAVENOUS | Status: AC
  Filled 2024-07-21: qty 20

## 2024-07-21 MED ORDER — FENTANYL CITRATE (PF) 100 MCG/2ML IJ SOLN
INTRAMUSCULAR | Status: DC | PRN
  Administered 2024-07-21 (×9): 25 ug via INTRAVENOUS

## 2024-07-21 MED ORDER — OXYCODONE HCL 5 MG PO TABS
5.0000 mg | ORAL_TABLET | ORAL | Status: DC | PRN
  Administered 2024-07-21 – 2024-07-25 (×12): 5 mg via ORAL
  Filled 2024-07-21 (×12): qty 1

## 2024-07-21 MED ORDER — ONDANSETRON HCL 4 MG/2ML IJ SOLN
INTRAMUSCULAR | Status: DC | PRN
  Administered 2024-07-21: 4 mg via INTRAVENOUS

## 2024-07-21 NOTE — Anesthesia Procedure Notes (Signed)
 Procedure Name: Intubation Date/Time: 07/21/2024 9:02 AM  Performed by: Obadiah Reyes BROCKS, CRNAPre-anesthesia Checklist: Patient identified, Emergency Drugs available, Suction available and Patient being monitored Patient Re-evaluated:Patient Re-evaluated prior to induction Oxygen Delivery Method: Circle System Utilized Preoxygenation: Pre-oxygenation with 100% oxygen Induction Type: IV induction Ventilation: Mask ventilation without difficulty Laryngoscope Size: Miller and 2 Grade View: Grade II Tube type: Oral Number of attempts: 1 Airway Equipment and Method: Stylet and Oral airway Placement Confirmation: ETT inserted through vocal cords under direct vision, positive ETCO2 and breath sounds checked- equal and bilateral Secured at: 23 cm Tube secured with: Tape Dental Injury: Teeth and Oropharynx as per pre-operative assessment

## 2024-07-21 NOTE — Plan of Care (Signed)
" °  Problem: Education: Goal: Knowledge of General Education information will improve Description: Including pain rating scale, medication(s)/side effects and non-pharmacologic comfort measures Outcome: Progressing   Problem: Health Behavior/Discharge Planning: Goal: Ability to manage health-related needs will improve Outcome: Progressing   Problem: Clinical Measurements: Goal: Ability to maintain clinical measurements within normal limits will improve Outcome: Progressing Goal: Will remain free from infection Outcome: Progressing Goal: Diagnostic test results will improve Outcome: Progressing Goal: Respiratory complications will improve Outcome: Progressing Goal: Cardiovascular complication will be avoided Outcome: Progressing   Problem: Activity: Goal: Risk for activity intolerance will decrease Outcome: Progressing   Problem: Nutrition: Goal: Adequate nutrition will be maintained Outcome: Progressing   Problem: Coping: Goal: Level of anxiety will decrease Outcome: Progressing   Problem: Elimination: Goal: Will not experience complications related to bowel motility Outcome: Progressing Goal: Will not experience complications related to urinary retention Outcome: Progressing   Problem: Pain Managment: Goal: General experience of comfort will improve and/or be controlled Outcome: Progressing   Problem: Safety: Goal: Ability to remain free from injury will improve Outcome: Progressing   Problem: Skin Integrity: Goal: Risk for impaired skin integrity will decrease Outcome: Progressing   Problem: Education: Goal: Patient will understand all VAD equipment and how it functions Outcome: Progressing Goal: Patient will be able to verbalize current INR target range and antiplatelet therapy for discharge home Outcome: Progressing   Problem: Cardiac: Goal: LVAD will function as expected and patient will experience no clinical alarms Outcome: Progressing   Problem:  Education: Goal: Ability to describe self-care measures that may prevent or decrease complications (Diabetes Survival Skills Education) will improve Outcome: Progressing Goal: Individualized Educational Video(s) Outcome: Progressing   Problem: Coping: Goal: Ability to adjust to condition or change in health will improve Outcome: Progressing   Problem: Fluid Volume: Goal: Ability to maintain a balanced intake and output will improve Outcome: Progressing   Problem: Health Behavior/Discharge Planning: Goal: Ability to identify and utilize available resources and services will improve Outcome: Progressing Goal: Ability to manage health-related needs will improve Outcome: Progressing   Problem: Metabolic: Goal: Ability to maintain appropriate glucose levels will improve Outcome: Progressing   "

## 2024-07-21 NOTE — Plan of Care (Signed)
" °  Problem: Education: Goal: Knowledge of General Education information will improve Description: Including pain rating scale, medication(s)/side effects and non-pharmacologic comfort measures Outcome: Progressing   Problem: Health Behavior/Discharge Planning: Goal: Ability to manage health-related needs will improve Outcome: Progressing   Problem: Clinical Measurements: Goal: Ability to maintain clinical measurements within normal limits will improve Outcome: Progressing Goal: Diagnostic test results will improve Outcome: Progressing Goal: Respiratory complications will improve Outcome: Progressing Goal: Cardiovascular complication will be avoided Outcome: Progressing   Problem: Nutrition: Goal: Adequate nutrition will be maintained Outcome: Progressing   Problem: Pain Managment: Goal: General experience of comfort will improve and/or be controlled Outcome: Progressing   Problem: Safety: Goal: Ability to remain free from injury will improve Outcome: Progressing   Problem: Skin Integrity: Goal: Risk for impaired skin integrity will decrease Outcome: Progressing   Problem: Education: Goal: Patient will understand all VAD equipment and how it functions Outcome: Progressing   Problem: Coping: Goal: Ability to adjust to condition or change in health will improve Outcome: Progressing   "

## 2024-07-21 NOTE — Op Note (Signed)
 Preoperative diagnosis: incarcerated left inguinal hernia Postoperative diagnosis: same as above Procedure: Open left inguinal hernia repair with mesh Surgeon: Dr Adina Bury EBL: 100 cc Anesthesia: general with TAP block Complications none Drains none Specimens none Sponge and needle count correct Dispo recovery   Indications: This is a 54 year old male who has chronic systolic heart failure status post a LVAD placement.  He has had a long-term inguinal hernia that is bothering him.  After discussion with the heart failure team we elected to try to proceed with an open repair of this hernia.  Procedure: After informed consent was obtained he was taken to the operating room.  SCDs were in place.  He was placed under general anesthesia without complication.  He was given antibiotics.  He had a Foley catheter placed due to the hernia.  He was then prepped and draped in a sterile sterile surgical fashion.  A surgical timeout was then performed.  I made a lengthy left groin incision.  I did not want to fix this with the laparoscope as I did not think he would tolerate insufflation.  I also did not want to do a laparotomy due to the morbidity of that as well.  I carried the incision through to the hernia sac.  His external oblique really was not even identifiable due to the size of the hernia.  I eventually was able to dissect in the hernia sac and enter into the hernia sac without injuring anything.  I was able to reduce his entire sigmoid colon from his scrotum.  I then encircled the cord structures with a Penrose drain.  I ended up trying to reduce the hernia but was unable to.  I ended up dividing the hernia to make this bigger just to be able to reduce the sigmoid colon.  The sigmoid colon as well as the hernia sac and a cord lipoma were then completely replaced into the abdomen.  I did excise some of the cord lipoma with a LigaSure device.  Once I had done this it was noticeable that he really  had no floor to his inguinal canal.  I tried to close the ring down with some 2-0 Vicryl sutures.  I eventually elected to place a UltraPro hernia system with the bottom portion of the bilayer in the preperitoneal space.  I was able to place this and spread it out.  I then spread the top layer flat.  The top layer was way too small to cover the floor.  I did sutured this into position to the internal superiorly and laid it flat behind the external oblique which I eventually was able to identify.  I sutured this medially to the pubic tubercle.  As well as to the shelving edge inferiorly.  I then placed a sheet of polypropylene over all of this.  This was heavyweight mesh.  I made a cut in this and wrapped around the spermatic cord.  I sutured this to the pubic tubercle with 2-0 Vicryl in 2 positions.  I then secured this to the shelving edge inferiorly in several positions.  Laterally I secured it to the shelving edge and then eventually I sewed together the tails.  I secured it to the internal oblique superiorly.  This mesh was in good position.  With a Valsalva this all looked to be a adequate repair with plenty of coverage of his floor.  This all was oozing at the end of this.  I stopped what ever I could and then  splayed some Surgicel powder into the wound.  I then closed the external oblique or what ever was left with 2-0 Vicryl.  Scarpa's was closed with 3-0 Vicryl and the skin was closed with 4-0 Monocryl.  Glue and Steri-Strips were applied.  He will keep his Foley overnight.  He is going to be transferred back to the PACU extubated.  He had no cardiac issues during the case and was monitored the entire time by the VAD coordinator.

## 2024-07-21 NOTE — Progress Notes (Signed)
 PHARMACY - ANTICOAGULATION CONSULT NOTE  Pharmacy Consult for heparin   Indication: LVAD  Allergies[1]  Patient Measurements: Height: 6' 3 (190.5 cm) Weight: (!) 145.6 kg (321 lb) IBW/kg (Calculated) : 84.5 HEPARIN  DW (KG): 117.6  Vital Signs: Temp: 97.7 F (36.5 C) (02/02 0637) Temp Source: Oral (02/02 0637) BP: 98/67 (02/02 0717) Pulse Rate: 65 (02/02 0717)  Labs: Recent Labs    07/19/24 0222 07/19/24 1751 07/20/24 0300 07/21/24 0130  HGB 14.7  --  14.6 14.1  HCT 43.1  --  42.4 41.5  PLT 198  --  227 215  LABPROT 20.5* 18.3* 18.5* 17.0*  INR 1.7* 1.4* 1.5* 1.3*  HEPARINUNFRC  --   --  <0.10* <0.10*  CREATININE 1.32*  --  1.37* 1.43*    Estimated Creatinine Clearance: 92 mL/min (A) (by C-G formula based on SCr of 1.43 mg/dL (H)).   Medical History: Past Medical History:  Diagnosis Date   Abnormal EKG    Acute systolic heart failure (HCC) 09/06/2017   Angina pectoris 09/05/2017   Body mass index 45.0-49.9, adult (HCC)    CAD S/P percutaneous coronary angioplasty 09/07/2017   mLAD and dLAD PCI with DES 09/06/17   CHF (congestive heart failure) (HCC)    Chronic systolic (congestive) heart failure (HCC) 03/19/2019   Complication of anesthesia 1995   had hard time waking up; breathing w/vasectomy   Coronary artery disease    Erectile dysfunction    Essential hypertension, benign    Fatigue    GERD (gastroesophageal reflux disease)    Gout    High cholesterol    just started tx yesterday (09/06/2017)   History of gout    RX prn (09/06/2017)   Ischemic cardiomyopathy 09/07/2017   EF 25-35%   Muscular chest pain    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   Below Left Nare (MOH's)   Obesity    Obstructive sleep apnea    OSA on CPAP    Plantar fasciitis    Pre-operative clearance 02/11/2018   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/02/2019   Saint Jude   Right bundle branch block    Shortness of breath 09/05/2017    Testosterone deficiency    Type 2 diabetes mellitus with complication, without long-term current use of insulin  (HCC) 09/05/2017   Type II diabetes mellitus (HCC)    started tx 08/28/2017    Assessment: 54 yo M with LVAD admitted for open inguinal hernia repair. Holding warfarin and low dose heparin  started preop.  Pt s/p hernia repair, no heparin  today per surgery.  Goal of Therapy:  INR 2-3  Monitor platelets by anticoagulation protocol: Yes   Plan:  Hold anticoagulation today  Ozell Jamaica, PharmD, BCPS, [facility] Clinical Pharmacist [phone] Please check AMION for all [facility] Pharmacy numbers 07/21/2024      [1]  Allergies Allergen Reactions   Tramadol  Other (See Comments)    Hallucinations

## 2024-07-21 NOTE — Transfer of Care (Signed)
 Immediate Anesthesia Transfer of Care Note  Patient: Joe [name]  Procedure(s) Performed: REPAIR, HERNIA, INGUINAL, INCARCERATED W/MESH (Left: Groin)  Patient Location: PACU  Anesthesia Type:General and Regional  Level of Consciousness: awake, alert , and oriented  Airway & Oxygen Therapy: Patient Spontanous Breathing and Patient connected to face mask oxygen  Post-op Assessment: Report given to RN and Post -op Vital signs reviewed and stable  Post vital signs: Reviewed and stable  Last Vitals:  Vitals Value Taken Time  BP 126/90 07/21/24 11:45  Temp    Pulse    Resp 17 07/21/24 11:46  SpO2    Vitals shown include unfiled device data.  Last Pain:  Vitals:   07/21/24 0717  TempSrc:   PainSc: 0-No pain         Complications: No notable events documented.

## 2024-07-21 NOTE — Inpatient Diabetes Management (Signed)
 Inpatient Diabetes Program Recommendations  AACE/ADA: New Consensus Statement on Inpatient Glycemic Control (2015)  Target Ranges:  Prepandial:   less than 140 mg/dL      Peak postprandial:   less than 180 mg/dL (1-2 hours)      Critically ill patients:  140 - 180 mg/dL   Lab Results  Component Value Date   GLUCAP 211 (H) 07/21/2024   HGBA1C 8.0 (H) 07/18/2024    Review of Glycemic Control  Latest Reference Range & Units 07/20/24 21:19 07/21/24 05:49 07/21/24 08:05 07/21/24 11:53  Glucose-Capillary 70 - 99 mg/dL 788 (H) 802 (H) 822 (H) 211 (H)   Diabetes history: DM  Outpatient Diabetes medications:  Jardiance  25 mg daily Tresiba 20 units daily Metformin  XR 1000 mg bid Mounjaro 10 mg weekly Current orders for Inpatient glycemic control:  Novolog  0-15 units tid with meals and HS Lantus  10 units daily Inpatient Diabetes Program Recommendations:    Please consider increasing Lantus  to 20 units daily (this is home basal dose).   Thanks,  Randall Bullocks, RN, BC-ADM Inpatient Diabetes Coordinator Pager [phone]  (8a-5p)

## 2024-07-21 NOTE — Anesthesia Procedure Notes (Signed)
 Anesthesia Regional Block: TAP block  Laterality: Left and Upper  Prep: chloraprep       Needles:  Injection technique: Single-shot  Needle Type: Echogenic Stimulator Needle     Needle Length: 10cm  Needle Gauge: 20     Additional Needles:   Procedures:,,,, ultrasound used (permanent image in chart),,    Narrative:  Start time: 07/21/2024 7:45 AM End time: 07/21/2024 7:51 AM  Performed by: Personally  Anesthesiologist: Jefm Garnette LABOR, MD

## 2024-07-21 NOTE — TOC Progression Note (Signed)
 Transition of Care [facility]) - Progression Note    Patient Details  Name: Joe [name] MRN: [id number] Date of Birth: May 21, 1971  Transition of Care [facility]) CM/SW Contact  [name] Phone Number: [phone] 07/21/2024, 1:48 PM  Clinical Narrative:   Per chart review, patient is not medically stable for dc. ICM will continue to follow.    HF CSW/CM will continue to follow and monitor for dc readiness.    Expected Discharge Plan: Home/Self Care Barriers to Discharge: Continued Medical Work up               Expected Discharge Plan and Services       Living arrangements for the past 2 months: Single Family Home                                       Social Drivers of Health (SDOH) Interventions SDOH Screenings   Food Insecurity: No Food Insecurity (07/18/2024)  Housing: Low Risk (07/18/2024)  Transportation Needs: No Transportation Needs (07/18/2024)  Utilities: Not At Risk (07/18/2024)  Depression (PHQ2-9): Low Risk (07/20/2024)  Financial Resource Strain: Low Risk (04/20/2023)  Social Connections: Unknown (10/28/2021)   Received from [facility]  Tobacco Use: Low Risk (07/21/2024)    Readmission Risk Interventions     No data to display

## 2024-07-22 ENCOUNTER — Encounter (HOSPITAL_COMMUNITY): Payer: Self-pay | Admitting: General Surgery

## 2024-07-22 LAB — BASIC METABOLIC PANEL WITH GFR
Anion gap: 14 (ref 5–15)
BUN: 18 mg/dL (ref 6–20)
CO2: 27 mmol/L (ref 22–32)
Calcium: 8.6 mg/dL — ABNORMAL LOW (ref 8.9–10.3)
Chloride: 96 mmol/L — ABNORMAL LOW (ref 98–111)
Creatinine, Ser: 1.24 mg/dL (ref 0.61–1.24)
GFR, Estimated: >60 mL/min
Glucose, Bld: 255 mg/dL — ABNORMAL HIGH (ref 70–99)
Potassium: 4.3 mmol/L (ref 3.5–5.1)
Sodium: 136 mmol/L (ref 135–145)

## 2024-07-22 LAB — CBC
HCT: 40.9 % (ref 39.0–52.0)
Hemoglobin: 14 g/dL (ref 13.0–17.0)
MCH: 31.1 pg (ref 26.0–34.0)
MCHC: 34.2 g/dL (ref 30.0–36.0)
MCV: 90.9 fL (ref 80.0–100.0)
Platelets: 257 10*3/uL (ref 150–400)
RBC: 4.5 MIL/uL (ref 4.22–5.81)
RDW: 14.6 % (ref 11.5–15.5)
WBC: 13.6 10*3/uL — ABNORMAL HIGH (ref 4.0–10.5)
nRBC: 0 % (ref 0.0–0.2)

## 2024-07-22 LAB — PROTIME-INR
INR: 1.2 (ref 0.8–1.2)
Prothrombin Time: 15.4 s — ABNORMAL HIGH (ref 11.4–15.2)

## 2024-07-22 LAB — LACTATE DEHYDROGENASE: LDH: 297 U/L — ABNORMAL HIGH (ref 105–235)

## 2024-07-22 LAB — GLUCOSE, CAPILLARY
Glucose-Capillary: 240 mg/dL — ABNORMAL HIGH (ref 70–99)
Glucose-Capillary: 290 mg/dL — ABNORMAL HIGH (ref 70–99)
Glucose-Capillary: 382 mg/dL — ABNORMAL HIGH (ref 70–99)
Glucose-Capillary: 406 mg/dL — ABNORMAL HIGH (ref 70–99)
Glucose-Capillary: 356 mg/dL — ABNORMAL HIGH (ref 70–99)

## 2024-07-22 MED ORDER — SENNA 8.6 MG PO TABS
1.0000 | ORAL_TABLET | Freq: Every day | ORAL | Status: DC
  Administered 2024-07-22: 8.6 mg via ORAL
  Filled 2024-07-22 (×2): qty 1

## 2024-07-22 MED ORDER — WARFARIN - PHARMACIST DOSING INPATIENT: Freq: Every day | Status: DC

## 2024-07-22 MED ORDER — POLYETHYLENE GLYCOL 3350 17 G PO PACK
17.0000 g | PACK | Freq: Every day | ORAL | Status: DC
  Filled 2024-07-22 (×2): qty 1

## 2024-07-22 MED ORDER — HEPARIN (PORCINE) 25000 UT/250ML-% IV SOLN
500.0000 [IU]/h | INTRAVENOUS | Status: DC
  Administered 2024-07-22: 500 [IU]/h via INTRAVENOUS
  Filled 2024-07-22 (×2): qty 250

## 2024-07-22 MED ORDER — WARFARIN SODIUM 7.5 MG PO TABS
12.5000 mg | ORAL_TABLET | Freq: Once | ORAL | Status: AC
  Administered 2024-07-22: 12.5 mg via ORAL
  Filled 2024-07-22: qty 1

## 2024-07-22 MED ORDER — INSULIN GLARGINE-YFGN 100 UNIT/ML ~~LOC~~ SOLN
20.0000 [IU] | Freq: Every day | SUBCUTANEOUS | Status: DC
  Administered 2024-07-22: 20 [IU] via SUBCUTANEOUS
  Filled 2024-07-22 (×2): qty 0.2

## 2024-07-22 MED ORDER — INSULIN GLARGINE-YFGN 100 UNIT/ML ~~LOC~~ SOLN
15.0000 [IU] | Freq: Every day | SUBCUTANEOUS | Status: DC
  Filled 2024-07-22: qty 0.15

## 2024-07-22 NOTE — Plan of Care (Signed)
" °  Problem: Education: Goal: Knowledge of General Education information will improve Description: Including pain rating scale, medication(s)/side effects and non-pharmacologic comfort measures Outcome: Progressing   Problem: Health Behavior/Discharge Planning: Goal: Ability to manage health-related needs will improve Outcome: Progressing   Problem: Clinical Measurements: Goal: Ability to maintain clinical measurements within normal limits will improve Outcome: Progressing Goal: Will remain free from infection Outcome: Progressing Goal: Diagnostic test results will improve Outcome: Progressing   Problem: Nutrition: Goal: Adequate nutrition will be maintained Outcome: Progressing   Problem: Coping: Goal: Level of anxiety will decrease Outcome: Progressing   Problem: Elimination: Goal: Will not experience complications related to bowel motility Outcome: Progressing Goal: Will not experience complications related to urinary retention Outcome: Progressing   Problem: Education: Goal: Patient will understand all VAD equipment and how it functions Outcome: Progressing Goal: Patient will be able to verbalize current INR target range and antiplatelet therapy for discharge home Outcome: Progressing   "

## 2024-07-22 NOTE — Progress Notes (Signed)
 Remote ICD Transmission

## 2024-07-22 NOTE — Progress Notes (Signed)
 LVAD Coordinator Rounding Note:   Admitted 07/18/24 for heparin  bridge and optimization prior to his Hernia repair with DR Ebbie posted for 07/21/24.    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   Pt sitting up in the chair. He states that he is sore today but feels ok. INR is 1.2 today.     Vital signs: Temp: 98.7 HR: 78 Doppler Pressure: not documented Automatic BP: 98/67 (84) O2 Sat: 94% RA Wt: 323.3>321 lbs     LVAD interrogation reveals:  Speed: 5800 Flow: 5.6 Power: 4.6 w PI: 3  Alarms: several LV and no ext power on 1/25 - pt states his MPU kept coming unplugged from the outlet [place]. Several LV and one NO External Power on 1/29 - pt states he loss power yesterday morning and he had a brief alarm prior to his Generac coming on Events: none for today; 10 in the OR yesterday Hematocrit: 41  Fixed speed: 5800 Low speed limit: 5500   Drive Line: CDI. Drive line anchor secure. Continue weekly dressing changes by nurse champion or VAD Coordinator. Next dressing change 07/25/24.    Labs:  LDH trend: 267>215>297  Hgb trend: 13.9>14.1>14   INR trend: 1.6>1.3>1.2  Anticoagulation Plan: -INR Goal: 2.0-2.5 -ASA Dose: 81 mg stop date of 06/02/23   Device: -St Jude BiV - Demand rate decreased to 60 05/16/23 -Therapies: ON    Adverse Events on VAD:   Plan/Recommendations:  1. Page VAD coordinators for any equipment or drive line issues 2. Weekly drive line dressing changes per nurse champion or VAD Coordinator    Lauraine Ip RN,BSN VAD Coordinator  Office: [phone]  24/7 Pager: [phone]

## 2024-07-22 NOTE — Plan of Care (Signed)
" °  Problem: Education: Goal: Knowledge of General Education information will improve Description: Including pain rating scale, medication(s)/side effects and non-pharmacologic comfort measures Outcome: Progressing   Problem: Health Behavior/Discharge Planning: Goal: Ability to manage health-related needs will improve Outcome: Progressing   Problem: Clinical Measurements: Goal: Ability to maintain clinical measurements within normal limits will improve Outcome: Progressing Goal: Will remain free from infection Outcome: Progressing Goal: Diagnostic test results will improve Outcome: Progressing Goal: Respiratory complications will improve Outcome: Progressing Goal: Cardiovascular complication will be avoided Outcome: Progressing   Problem: Activity: Goal: Risk for activity intolerance will decrease Outcome: Progressing   Problem: Nutrition: Goal: Adequate nutrition will be maintained Outcome: Progressing   Problem: Coping: Goal: Level of anxiety will decrease Outcome: Progressing   Problem: Elimination: Goal: Will not experience complications related to bowel motility Outcome: Progressing Goal: Will not experience complications related to urinary retention Outcome: Progressing   Problem: Pain Managment: Goal: General experience of comfort will improve and/or be controlled Outcome: Progressing   Problem: Safety: Goal: Ability to remain free from injury will improve Outcome: Progressing   Problem: Cardiac: Goal: LVAD will function as expected and patient will experience no clinical alarms Outcome: Progressing   Problem: Education: Goal: Patient will understand all VAD equipment and how it functions Outcome: Progressing Goal: Patient will be able to verbalize current INR target range and antiplatelet therapy for discharge home Outcome: Progressing   Problem: Education: Goal: Ability to describe self-care measures that may prevent or decrease complications (Diabetes  Survival Skills Education) will improve Outcome: Progressing Goal: Individualized Educational Video(s) Outcome: Progressing   "

## 2024-07-23 ENCOUNTER — Other Ambulatory Visit (HOSPITAL_COMMUNITY): Payer: Self-pay

## 2024-07-23 LAB — CBC
HCT: 37.5 % — ABNORMAL LOW (ref 39.0–52.0)
Hemoglobin: 12.7 g/dL — ABNORMAL LOW (ref 13.0–17.0)
MCH: 31.2 pg (ref 26.0–34.0)
MCHC: 33.9 g/dL (ref 30.0–36.0)
MCV: 92.1 fL (ref 80.0–100.0)
Platelets: 203 10*3/uL (ref 150–400)
RBC: 4.07 MIL/uL — ABNORMAL LOW (ref 4.22–5.81)
RDW: 15 % (ref 11.5–15.5)
WBC: 12.3 10*3/uL — ABNORMAL HIGH (ref 4.0–10.5)
nRBC: 0 % (ref 0.0–0.2)

## 2024-07-23 LAB — BASIC METABOLIC PANEL WITH GFR
Anion gap: 10 (ref 5–15)
BUN: 23 mg/dL — ABNORMAL HIGH (ref 6–20)
CO2: 30 mmol/L (ref 22–32)
Calcium: 8.7 mg/dL — ABNORMAL LOW (ref 8.9–10.3)
Chloride: 94 mmol/L — ABNORMAL LOW (ref 98–111)
Creatinine, Ser: 1.27 mg/dL — ABNORMAL HIGH (ref 0.61–1.24)
GFR, Estimated: >60 mL/min
Glucose, Bld: 218 mg/dL — ABNORMAL HIGH (ref 70–99)
Potassium: 3.8 mmol/L (ref 3.5–5.1)
Sodium: 135 mmol/L (ref 135–145)

## 2024-07-23 LAB — LACTATE DEHYDROGENASE: LDH: 264 U/L — ABNORMAL HIGH (ref 105–235)

## 2024-07-23 LAB — GLUCOSE, CAPILLARY
Glucose-Capillary: 225 mg/dL — ABNORMAL HIGH (ref 70–99)
Glucose-Capillary: 401 mg/dL — ABNORMAL HIGH (ref 70–99)
Glucose-Capillary: 370 mg/dL — ABNORMAL HIGH (ref 70–99)
Glucose-Capillary: 301 mg/dL — ABNORMAL HIGH (ref 70–99)
Glucose-Capillary: 303 mg/dL — ABNORMAL HIGH (ref 70–99)

## 2024-07-23 LAB — PROTIME-INR
INR: 1.2 (ref 0.8–1.2)
Prothrombin Time: 16.1 s — ABNORMAL HIGH (ref 11.4–15.2)

## 2024-07-23 LAB — HEPARIN LEVEL (UNFRACTIONATED): Heparin Unfractionated: <0.1 [IU]/mL — ABNORMAL LOW (ref 0.30–0.70)

## 2024-07-23 MED ORDER — WARFARIN SODIUM 7.5 MG PO TABS
12.5000 mg | ORAL_TABLET | Freq: Once | ORAL | Status: AC
  Administered 2024-07-23: 12.5 mg via ORAL
  Filled 2024-07-23: qty 1

## 2024-07-23 MED ORDER — POTASSIUM CHLORIDE CRYS ER 20 MEQ PO TBCR: 20.0000 meq | EXTENDED_RELEASE_TABLET | Freq: Once | ORAL | Status: DC

## 2024-07-23 MED ORDER — INSULIN GLARGINE-YFGN 100 UNIT/ML ~~LOC~~ SOLN
30.0000 [IU] | Freq: Every day | SUBCUTANEOUS | Status: DC
  Administered 2024-07-23 – 2024-07-24 (×2): 30 [IU] via SUBCUTANEOUS
  Filled 2024-07-23 (×2): qty 0.3

## 2024-07-23 MED ORDER — OXYCODONE HCL 5 MG PO TABS
5.0000 mg | ORAL_TABLET | ORAL | 0 refills | Status: AC | PRN
  Filled 2024-07-23: qty 30, 5d supply, fill #0

## 2024-07-23 MED ORDER — POTASSIUM CHLORIDE CRYS ER 20 MEQ PO TBCR
40.0000 meq | EXTENDED_RELEASE_TABLET | Freq: Once | ORAL | Status: AC
  Administered 2024-07-23: 40 meq via ORAL
  Filled 2024-07-23: qty 2

## 2024-07-23 MED ORDER — INSULIN ASPART 100 UNIT/ML IJ SOLN
3.0000 [IU] | Freq: Three times a day (TID) | INTRAMUSCULAR | Status: DC
  Administered 2024-07-23 – 2024-07-24 (×2): 3 [IU] via SUBCUTANEOUS
  Filled 2024-07-23 (×2): qty 3

## 2024-07-23 NOTE — Care Management Important Message (Signed)
 Important Message  Patient Details  Name: Joe [name] MRN: [id number] Date of Birth: 09-01-1970   Important Message Given:  Yes - Medicare IM     Claretta Deed 07/23/2024, 3:18 PM

## 2024-07-23 NOTE — Discharge Summary (Incomplete)
 Advanced Heart Failure Team  Discharge Summary   Patient ID: Joe [name] MRN: [id number], DOB/AGE: 54/12/1970 53 y.o. Admit date: 07/18/2024 D/C date:     07/25/2024   Primary Discharge Diagnoses:  Incarcerated Left Inguinal Hernia, s/p Repair   Secondary Discharge Diagnoses:  Chronic Systolic Heart Failure, w/ HMIII LVAD Chronic Anticoagulation Therapy w/ Coumadin   Obesity   Hospital Course:    54 y/o male w/ chronic systolic heart failure s/p HMIII LVAD implanted on 05/03/23 by Dr.Van Trigt under destination therapy criteria due to obesity. He was recently referred to outpatient general surgery given incarcerated left inguinal hernia. Decision made to proceed w/ repair. He was direct admitted 07/18/24 for heparin  bridge, while holding coumadin , and optimization prior to OR. On 2/2, he underwent successful hernia repair w/o complication. Coumadin  restarted on 2/3 w/ heparin  bridge until INR > 1.6.   On 2/7, INR was 1.7. Cleared by Dr. Bensimhon to be discharged home. Instructed to continue previous home coumadin  regimen. All other HF meds continued, except Jardiance . He was instructed to continue to hold until his post hospital f/u d/t presence of scrotal swelling (noted to be present prior to surgery, per pt report).   VAD clinic f/u arranged in 1 wk.   Post surgical f/u arrange w/ Dr. Ebbie 3/3.    LVAD Interrogation HM III:   Speed:  5850   Flow: 5.4    PI:  3.0   Power:  4.6     Discharge Weight Range: 325 lb  Discharge Vitals: Blood pressure 110/83, pulse 73, temperature 98 F (36.7 C), temperature source Oral, resp. rate 16, height 6' 3 (1.905 m), weight (!) 147.6 kg, SpO2 98%.  Physical Exam: GENERAL: no acute distress. HEENT: normal  NECK: Supple, JVP not elevated  .  2+ bilaterally, no bruits.  No lymphadenopathy or thyromegaly appreciated.   CARDIAC:  Mechanical heart sounds with LVAD hum present.  LUNGS:  Clear to auscultation bilaterally.  ABDOMEN:  obese  soft, round, nontender, positive bowel sounds x4.     LVAD exit site:   Dressing dry and intact.  No erythema or drainage.  Stabilization device present and accurately applied.  EXTREMITIES:  Warm and dry, no cyanosis, clubbing, or rash. Trace b/l LEE . Left inguinal surgical site stable  NEUROLOGIC:  Alert and oriented x 4.  Gait steady.  No aphasia.  No dysarthria.  Affect pleasant.    GU: scrotal swelling   Labs: Lab Results  Component Value Date   WBC 9.3 07/25/2024   HGB 11.1 (L) 07/25/2024   HCT 32.6 (L) 07/25/2024   MCV 91.6 07/25/2024   PLT 217 07/25/2024    Recent Labs  Lab 07/25/24 0225  NA 132*  K 3.5  CL 92*  CO2 33*  BUN 19  CREATININE 1.33*  CALCIUM  8.4*  GLUCOSE 301*   Lab Results  Component Value Date   CHOL 172 11/28/2021   HDL 27 (L) 11/28/2021   LDLCALC 96 11/28/2021   TRIG 244 (H) 11/28/2021   BNP (last 3 results) No results for input(s): BNP in the last 8760 hours.  ProBNP (last 3 results) No results for input(s): PROBNP in the last 8760 hours.   Diagnostic Studies/Procedures   S/p Inguinal Hernia Repair (see Op Note)  Discharge Medications   Allergies as of 07/25/2024       Reactions   Tramadol  Other (See Comments)   Hallucinations        Medication List     STOP  taking these medications    empagliflozin  25 MG Tabs tablet Commonly known as: JARDIANCE        TAKE these medications    acetaminophen  500 MG tablet Commonly known as: TYLENOL  Take 1,000 mg by mouth every 6 (six) hours as needed for moderate pain or headache.   allopurinol  100 MG tablet Commonly known as: ZYLOPRIM  Take 100 mg by mouth in the morning.   amiodarone  200 MG tablet Commonly known as: PACERONE  Take 1 tablet (200 mg total) by mouth daily.   BLINK TEARS OP Place 2 drops into both eyes 4 (four) times daily as needed (for dry eyes).   colchicine 0.6 MG tablet Take 0.6 mg by mouth daily as needed (Gout).   diclofenac  Sodium 1 % Gel Commonly  known as: VOLTAREN  APPLY 1 G TOPICALLY DAILY. What changed: See the new instructions.   digoxin  0.125 MG tablet Commonly known as: LANOXIN  Take 1 tablet (0.125 mg total) by mouth daily.   escitalopram  10 MG tablet Commonly known as: LEXAPRO  Take 10 mg by mouth at bedtime. What changed: how much to take   ezetimibe  10 MG tablet Commonly known as: ZETIA  Take 1 tablet (10 mg total) by mouth daily.   Furoscix  80 MG/10ML Ctkt Generic drug: Furosemide  Inject 80 mg into the skin as needed.   gabapentin  300 MG capsule Commonly known as: NEURONTIN  Take 300 mg by mouth 3 (three) times daily.   insulin  degludec 100 UNIT/ML FlexTouch Pen Commonly known as: TRESIBA Inject 20 Units into the skin in the morning.   levothyroxine  25 MCG tablet Commonly known as: SYNTHROID  Take 25 mcg by mouth daily before breakfast.   metFORMIN  500 MG 24 hr tablet Commonly known as: GLUCOPHAGE -XR Take 1,000 mg by mouth in the morning and at bedtime.   mexiletine 250 MG capsule Commonly known as: MEXITIL  Take 1 capsule (250 mg total) by mouth 2 (two) times daily.   Mounjaro 7.5 MG/0.5ML Pen Generic drug: tirzepatide Inject 7.5 mg into the skin once a week. What changed: how much to take   multivitamin Tabs tablet Take 1 tablet by mouth in the morning.   omeprazole  40 MG capsule Commonly known as: PRILOSEC Take 1 capsule (40 mg total) by mouth daily.   ondansetron  4 MG disintegrating tablet Commonly known as: ZOFRAN -ODT Take 1 tablet (4 mg total) by mouth every 8 (eight) hours as needed for nausea or vomiting.   oxyCODONE  5 MG immediate release tablet Commonly known as: Oxy IR/ROXICODONE  Take 1 tablet (5 mg total) by mouth every 4 (four) hours as needed for severe pain (pain score 7-10) or moderate pain (pain score 4-6).   potassium chloride  SA 20 MEQ tablet Commonly known as: KLOR-CON  M Take 2 tablets (40 mEq total) by mouth as needed (Please take 40meq on days Furosix is  administer). What changed:  how much to take when to take this   ranolazine  500 MG 12 hr tablet Commonly known as: RANEXA  Take 1 tablet (500 mg total) by mouth daily.   rosuvastatin  40 MG tablet Commonly known as: CRESTOR  TAKE 1 TABLET BY MOUTH EVERY DAY   sildenafil  20 MG tablet Commonly known as: REVATIO  Take 1 tablet (20 mg total) by mouth 3 (three) times daily.   sildenafil  50 MG tablet Commonly known as: Viagra  Take 1 tablet (50 mg total) by mouth daily as needed for erectile dysfunction.   spironolactone  25 MG tablet Commonly known as: ALDACTONE  Take 1 tablet (25 mg total) by mouth daily.   torsemide   20 MG tablet Commonly known as: DEMADEX  Take 3 tablets (60 mg total) by mouth daily.   triamcinolone cream 0.1 % Commonly known as: KENALOG Apply 1 Application topically 2 (two) times daily as needed (Eczema).   Vicks VapoInhaler Inha Inhale 1 Inhalation into the lungs daily as needed (Congestion).   warfarin 5 MG tablet Commonly known as: COUMADIN  Take as directed. If you are unsure how to take this medication, talk to your nurse or doctor. Original instructions: Take 10 mg (2 tablets) every Wed/Sat and 7.5 mg (1.5 tablets) all other days or as instructed by the HF clinic What changed:  how much to take how to take this when to take this        Disposition   The patient will be discharged in stable condition to home.   Follow-up Information     Ebbie Cough, MD Follow up on 08/19/2024.   Specialty: General Surgery Why: 1:30 Contact information: [address] [place] [address] [phone]         Montey Lot, PA-C. Go in 11 day(s).   Specialty: Physician Assistant Why: Hospital follow up appointment scheduled for 08/05/24 at 8:30 AM.  PLEASE ARRIVE 10-15 minutes early.  PLEASE call to cance/reschedule if you CANNOT make appointment. Contact information: [address] [place] [address] [phone]          [facility] Follow up.   Specialty: Cardiology Why: 08/04/24 at 11:00 AM  Hospital Follow Up in VAD Clinic Contact information: [address] [place]  [phone]                  APP Duration of Discharge Encounter: 30  Signed, Caffie Shed  07/25/2024, 1:40 PM

## 2024-07-23 NOTE — Progress Notes (Addendum)
 Patient ID: Joe [name], male   DOB: Jan 21, 1971, 54 y.o.   MRN: [id number]     Advanced Heart Failure VAD Rounding Note  Cardiologist: Joe Fitch, MD  AHF: Dr. Cherrie   Patient Profile:    54 y/o male w/ chronic systolic heart failure s/p HMIII LVAD implanted on 05/03/23 by JoeVantrigt under destination therapy criteria due to obesity, admitted for heparin  bridge and optimization prior to elective inguinal hernia repair.    Significant Events:    2/2: s/p inguinal hernia repair   Subjective:   POD# 2   Back on coumadin  w/ heparin  gtt. INR 1.2 today   Tolerating diet and having BMs. Feeling better    LVAD Interrogation HM 3: Speed: 5800 Flow: 5.5 PI: 3.0 Power: 4.9.  MAP 86    Objective:   Weight Range: (!) 146.2 kg Body mass index is 40.3 kg/m.   Vital Signs:   Temp:  [98 F (36.7 C)-98.7 F (37.1 C)] 98 F (36.7 C) (02/04 0753) Pulse Rate:  [70-76] 75 (02/04 0753) Resp:  [17-19] 17 (02/04 0753) BP: (98-121)/(69-96) 102/78 (02/04 0753) SpO2:  [92 %] 92 % (02/03 1617) Weight:  [146.2 kg] 146.2 kg (02/04 0419) Last BM Date : 07/22/24  Weight change: Filed Weights   07/21/24 0637 07/22/24 0621 07/23/24 0419  Weight: (!) 145.6 kg (!) 143.7 kg (!) 146.2 kg    Intake/Output:   Intake/Output Summary (Last 24 hours) at 07/23/2024 0956 Last data filed at 07/23/2024 0500 Gross per 24 hour  Intake 866.65 ml  Output --  Net 866.65 ml     Physical Exam   GENERAL: well appearing, NAD  CARDIAC:  JVD not elevated, + LVAD hum  LUNGS:  CTAB  LVAD exit site:  dressing dry and intact.  Stabilization device present and accurately applied.   EXTREMITIES:  no LEE  NEUROLOGIC:  A&Ox 3. Affect pleasant    Telemetry   V paced 70s, personally reviewed   Labs   CBC Recent Labs    07/22/24 0139 07/23/24 0213  WBC 13.6* 12.3*  HGB 14.0 12.7*  HCT 40.9 37.5*  MCV 90.9 92.1  PLT 257 203   Basic Metabolic Panel Recent Labs    97/96/73 0139  07/23/24 0213  NA 136 135  K 4.3 3.8  CL 96* 94*  CO2 27 30  GLUCOSE 255* 218*  BUN 18 23*  CREATININE 1.24 1.27*  CALCIUM  8.6* 8.7*   Liver Function Tests No results for input(s): AST, ALT, ALKPHOS, BILITOT, PROT, ALBUMIN  in the last 72 hours. No results for input(s): LIPASE, AMYLASE in the last 72 hours. Cardiac Enzymes No results for input(s): CKTOTAL, CKMB, CKMBINDEX, TROPONINI in the last 72 hours.  BNP: BNP (last 3 results) No results for input(s): BNP in the last 8760 hours.  ProBNP (last 3 results) No results for input(s): PROBNP in the last 8760 hours.   D-Dimer No results for input(s): DDIMER in the last 72 hours. Hemoglobin A1C No results for input(s): HGBA1C in the last 72 hours.  Fasting Lipid Panel No results for input(s): CHOL, HDL, LDLCALC, TRIG, CHOLHDL, LDLDIRECT in the last 72 hours. Medications:   Scheduled Medications:  allopurinol   100 mg Oral q AM   amiodarone   200 mg Oral Daily   Chlorhexidine  Gluconate Cloth  6 each Topical Daily   digoxin   0.125 mg Oral Daily   escitalopram   10 mg Oral QHS   ezetimibe   10 mg Oral Daily   gabapentin   300 mg  Oral TID   insulin  aspart  0-15 Units Subcutaneous TID WC   insulin  aspart  0-5 Units Subcutaneous QHS   insulin  aspart  3 Units Subcutaneous TID WC   insulin  glargine-yfgn  30 Units Subcutaneous Daily   levothyroxine   25 mcg Oral QAC breakfast   mexiletine  250 mg Oral BID   multivitamin with minerals  1 tablet Oral q AM   pantoprazole   40 mg Oral Daily   polyethylene glycol  17 g Oral Daily   ranolazine   500 mg Oral Daily   rosuvastatin   40 mg Oral Daily   senna  1 tablet Oral Daily   sildenafil   20 mg Oral TID   spironolactone   25 mg Oral Daily   torsemide   60 mg Oral Daily   Warfarin - Pharmacist Dosing Inpatient   Does not apply q1600    Infusions:  heparin  500 Units/hr (07/22/24 1139)    PRN Medications: acetaminophen , ALPRAZolam ,  methocarbamol , oxyCODONE   Assessment/Plan    L inguinal hernia - s/p repair 2/2  - recovering well, appreciated GS    Chronic Systolic HF s/p HM3 Implantation 11/14 - due to iCM - Echo 05/01/23: EF 20%, RV moderately down (improved) - RHC (9/24): elevated biventricular filling pressures with low PAPi; CI 2.2 - S/p HM III LVAD 05/03/23 by Joe [name] - RAMP echo 11/21: Speeds left at 6000.  Speed increased to 6100 on 11/27. Speed increased to 6200 on 05/18/23.  - Continue sildenafil  and digoxin  for RV failure.  - Had been struggling with RV failure. VAD speed turned down to 5800. RHC 2/25 showed well compensated hemodynamics with dry weight 307-309  - Remains NYHA II-III (multifactorial) - Volume ok. Euvolemic.  Continue torsemide  60 mg daily  - Continue spironolactone  25 mg daily  - Holding SGLT2i periop. Restart at discharge. - Has been seen at [facility] and will consider for transplant once BMI down but he is struggling to lose weight.     LVAD management - s/p HM-3 implant 05/03/23 - VAD interrogated personally. Parameters stable. - INR 1.2. Continue warfarin + heparin  bridge. Can discharge once INR 1.6 or higher - LDH trending back down, 297>>264. Follow  - MAP 80s-90s   H/o VT Arrest and Hx PVCs - had VT arrest x 2 in 10/24 - Continue amiodarone  200 mg daily and Mexiletine 250 mg bid.    CAD with chronic stable angina - s/p multiple stents to LAD and LCX - LHC (9/24) with stable 3v CAD - No ischemic chest pain.  - Continue statin.    DM2 - SSI - holding Jardiance  periop, resume prior to discharge   OSA - Not using CPAP, says machine was recalled a while ago. - Needs new device   Morbid obesity - Continues to gain weight despite Mounjaro (holding) - Body mass index is 40.41 kg/m. - Understands need to get BMI down near 35 for transplant, no change     I reviewed the LVAD parameters from today, and compared the results to the patient's prior recorded data.  No  programming changes were made.  The LVAD is functioning within specified parameters.  The patient performs LVAD self-test daily.  LVAD interrogation was negative for any significant power changes, alarms or PI events/speed drops.  LVAD equipment check completed and is in good working order.  Back-up equipment present.   LVAD education done on emergency procedures and precautions and reviewed exit site care.  Length of Stay: [address], [place]  07/23/2024, 9:56  AM  Advanced Heart Failure Team Pager [phone] (M-F; 7a - 5p)   Please visit [email or web address]: For overnight coverage please call cardiology fellow first. If fellow not available call Shock/ECMO MD on call.  For ECMO / Mechanical Support (Impella, IABP, LVAD) issues call Shock / ECMO MD on call.     Patient seen and examined with the above-signed Advanced Practice Provider and/or Housestaff. I personally reviewed laboratory data, imaging studies and relevant notes. I independently examined the patient and formulated the important aspects of the plan. I have edited the note to reflect any of my changes or salient points. I have personally discussed the plan with the patient and/or family.  Pain controlled. Ambulating halls. MAPs and volume status ok  INR 1.2 on heparin   General:  NAD.  HEENT: normal  Neck: supple. JVP not elevated.  Carotids 2+ bilat; no bruits. No lymphadenopathy or thryomegaly appreciated. Cor: LVAD hum.  Lungs: Clear. Abdomen: obese soft, nontender, non-distended. No hepatosplenomegaly. No bruits or masses. Good bowel sounds. Driveline site clean. Anchor in place.  Extremities: no cyanosis, clubbing, rash. Warm no edema  Neuro: alert & oriented x 3. No focal deficits. Moves all 4 without problem   Doing well. Op site stable. Continue heparin  until INR >= 1.6  Discussed warfarin dosing with PharmD personally.  VAD interrogated personally. Parameters stable.  Toribio Fuel, MD  1:52 PM

## 2024-07-23 NOTE — TOC Progression Note (Signed)
 Transition of Care [facility]) - Progression Note    Patient Details  Name: Joe [name] MRN: [id number] Date of Birth: 1971-01-18  Transition of Care [facility]) CM/SW Contact  [name] Phone Number: [phone] 07/23/2024, 9:02 AM  Clinical Narrative:   Per chart review, patient is not medically stable for dc. ICM will continue to follow.    HF CSW/CM will continue to follow and monitor for dc readiness.    Expected Discharge Plan: Home/Self Care Barriers to Discharge: Continued Medical Work up               Expected Discharge Plan and Services       Living arrangements for the past 2 months: Single Family Home                                       Social Drivers of Health (SDOH) Interventions SDOH Screenings   Food Insecurity: No Food Insecurity (07/18/2024)  Housing: Low Risk (07/18/2024)  Transportation Needs: No Transportation Needs (07/18/2024)  Utilities: Not At Risk (07/18/2024)  Depression (PHQ2-9): Low Risk (07/20/2024)  Financial Resource Strain: Low Risk (04/20/2023)  Social Connections: Unknown (10/28/2021)   Received from [facility]  Tobacco Use: Low Risk (07/21/2024)    Readmission Risk Interventions     No data to display

## 2024-07-23 NOTE — Inpatient Diabetes Management (Signed)
 Inpatient Diabetes Program Recommendations  AACE/ADA: New Consensus Statement on Inpatient Glycemic Control (2015)  Target Ranges:  Prepandial:   less than 140 mg/dL      Peak postprandial:   less than 180 mg/dL (1-2 hours)      Critically ill patients:  140 - 180 mg/dL   Lab Results  Component Value Date   GLUCAP 225 (H) 07/23/2024   HGBA1C 8.0 (H) 07/18/2024    Review of Glycemic Control  Latest Reference Range & Units 07/21/24 21:07 07/22/24 05:55 07/22/24 11:50 07/22/24 16:14 07/22/24 16:20 07/22/24 21:18 07/23/24 06:27  Glucose-Capillary 70 - 99 mg/dL 726 (H) 759 (H) 709 (H) 406 (H) 382 (H) 356 (H) 225 (H)   Diabetes history: DM 2 Outpatient Diabetes medications:  Jardiance  25 mg daily Tresiba 20 units daily Metformin  XR 1000 mg bid Mounjaro 10 mg weekly Current orders for Inpatient glycemic control:  Novolog  0-15 units tid with meals and HS Lantus  20 units daily Inpatient Diabetes Program Recommendations:    Please consider increasing Lantus  to 30 units daily.  Also consider adding Novolog  meal coverage 3 units tid w/ meals and add CHO modified to diet.   Thanks,  Randall Bullocks, RN, BC-ADM Inpatient Diabetes Coordinator Pager [phone]  (8a-5p)

## 2024-07-23 NOTE — Plan of Care (Signed)
" °  Problem: Education: Goal: Knowledge of General Education information will improve Description: Including pain rating scale, medication(s)/side effects and non-pharmacologic comfort measures Outcome: Progressing   Problem: Health Behavior/Discharge Planning: Goal: Ability to manage health-related needs will improve Outcome: Progressing   Problem: Clinical Measurements: Goal: Ability to maintain clinical measurements within normal limits will improve Outcome: Progressing Goal: Will remain free from infection Outcome: Progressing Goal: Diagnostic test results will improve Outcome: Progressing Goal: Respiratory complications will improve Outcome: Progressing Goal: Cardiovascular complication will be avoided Outcome: Progressing   Problem: Nutrition: Goal: Adequate nutrition will be maintained Outcome: Progressing   Problem: Activity: Goal: Risk for activity intolerance will decrease Outcome: Progressing   Problem: Coping: Goal: Level of anxiety will decrease Outcome: Progressing   Problem: Elimination: Goal: Will not experience complications related to bowel motility Outcome: Progressing Goal: Will not experience complications related to urinary retention Outcome: Progressing   Problem: Safety: Goal: Ability to remain free from injury will improve Outcome: Progressing   Problem: Skin Integrity: Goal: Risk for impaired skin integrity will decrease Outcome: Progressing   Problem: Education: Goal: Patient will understand all VAD equipment and how it functions Outcome: Progressing Goal: Patient will be able to verbalize current INR target range and antiplatelet therapy for discharge home Outcome: Progressing   Problem: Education: Goal: Ability to describe self-care measures that may prevent or decrease complications (Diabetes Survival Skills Education) will improve Outcome: Progressing Goal: Individualized Educational Video(s) Outcome: Progressing   "

## 2024-07-23 NOTE — Plan of Care (Signed)

## 2024-07-23 NOTE — Progress Notes (Signed)
 PHARMACY - ANTICOAGULATION CONSULT NOTE  Pharmacy Consult for heparin   Indication: LVAD  Allergies[1]  Patient Measurements: Height: 6' 3 (190.5 cm) Weight: (!) 146.2 kg (322 lb 6.4 oz) IBW/kg (Calculated) : 84.5 HEPARIN  DW (KG): 117.6  Vital Signs: Temp: 98 F (36.7 C) (02/04 0753) Temp Source: Oral (02/04 0753) BP: 102/78 (02/04 0753) Pulse Rate: 75 (02/04 0753)  Labs: Recent Labs    07/21/24 0130 07/22/24 0139 07/23/24 0213  HGB 14.1 14.0 12.7*  HCT 41.5 40.9 37.5*  PLT 215 257 203  LABPROT 17.0* 15.4* 16.1*  INR 1.3* 1.2 1.2  HEPARINUNFRC <0.10*  --  <0.10*  CREATININE 1.43* 1.24 1.27*    Estimated Creatinine Clearance: 103.9 mL/min (A) (by C-G formula based on SCr of 1.27 mg/dL (H)).   Medical History: Past Medical History:  Diagnosis Date   Abnormal EKG    Acute systolic heart failure (HCC) 09/06/2017   Angina pectoris 09/05/2017   Body mass index 45.0-49.9, adult (HCC)    CAD S/P percutaneous coronary angioplasty 09/07/2017   mLAD and dLAD PCI with DES 09/06/17   CHF (congestive heart failure) (HCC)    Chronic systolic (congestive) heart failure (HCC) 03/19/2019   Complication of anesthesia 1995   had hard time waking up; breathing w/vasectomy   Coronary artery disease    Erectile dysfunction    Essential hypertension, benign    Fatigue    GERD (gastroesophageal reflux disease)    Gout    High cholesterol    just started tx yesterday (09/06/2017)   History of gout    RX prn (09/06/2017)   Ischemic cardiomyopathy 09/07/2017   EF 25-35%   Muscular chest pain    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   Below Left Nare (MOH's)   Obesity    Obstructive sleep apnea    OSA on CPAP    Plantar fasciitis    Pre-operative clearance 02/11/2018   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/02/2019   [employer]   Right bundle branch block    Shortness of breath 09/05/2017   Testosterone deficiency    Type 2 diabetes  mellitus with complication, without long-term current use of insulin  (HCC) 09/05/2017   Type II diabetes mellitus (HCC)    started tx 08/28/2017    Assessment: 54 yo M with LVAD admitted for open inguinal hernia repair on 2/2. Warfarin held preop and low dose IV heparin  started.  Heparin  resumed 2/3 am, warfarin to resume tonight. INR 1.2 as expected after holding, CBC and LDH stable. Will check heparin  level with am labs since it has been <0.1 as expected on low infusion rate.  Home warfarin dose is 10mg  Wed/Fri and 7.5mg  AODs, INRs have been low as an outpatient over last several checks.  Goal of Therapy:  INR 2-2.5  Monitor platelets by anticoagulation protocol: Yes   Plan:  Continue heparin  500 units/h Warfarin 12.5mg  x1 again tonight Daily INR, heparin  level, CBC  Harlene Barlow, [employer], BCCP Clinical Pharmacist  07/23/2024 11:09 AM   [facility] pharmacy phone numbers are listed on [email or web address]        [1]  Allergies Allergen Reactions   Tramadol  Other (See Comments)    Hallucinations

## 2024-07-23 NOTE — Progress Notes (Signed)
 2 Days Post-Op   Subjective/Chief Complaint: Sore,otherwise well, tol diet, having bms/flatus, voiding after foley out   Objective: Vital signs in last 24 hours: Temp:  [98 F (36.7 C)-98.7 F (37.1 C)] 98 F (36.7 C) (02/04 0753) Pulse Rate:  [70-76] 75 (02/04 0753) Resp:  [17-19] 17 (02/04 0753) BP: (98-121)/(69-96) 102/78 (02/04 0753) SpO2:  [92 %] 92 % (02/03 1617) Weight:  [146.2 kg] 146.2 kg (02/04 0419) Last BM Date : 07/22/24  Intake/Output from previous day: 02/03 0701 - 02/04 0700 In: 866.7 [P.O.:780; I.V.:86.7] Out: -  Intake/Output this shift: No intake/output data recorded.  Left groin incision without infection, some ecchymosis  Lab Results:  Recent Labs    07/22/24 0139 07/23/24 0213  WBC 13.6* 12.3*  HGB 14.0 12.7*  HCT 40.9 37.5*  PLT 257 203   BMET Recent Labs    07/22/24 0139 07/23/24 0213  NA 136 135  K 4.3 3.8  CL 96* 94*  CO2 27 30  GLUCOSE 255* 218*  BUN 18 23*  CREATININE 1.24 1.27*  CALCIUM  8.6* 8.7*   PT/INR Recent Labs    07/22/24 0139 07/23/24 0213  LABPROT 15.4* 16.1*  INR 1.2 1.2   ABG No results for input(s): PHART, HCO3 in the last 72 hours.  Invalid input(s): PCO2, PO2  Studies/Results: No results found.  Anti-infectives: Anti-infectives (From admission, onward)    Start     Dose/Rate Route Frequency Ordered Stop   07/21/24 0730  ceFAZolin  (ANCEF ) IVPB 3g/150 mL premix        3 g 300 mL/hr over 30 Minutes Intravenous To Surgery 07/21/24 0720 07/21/24 9062   07/21/24 0720  ceFAZolin  (ANCEF ) 3-4 GM/150ML-% IVPB       Note to Pharmacy: Barron Friday D: cabinet override      07/21/24 0720 07/21/24 0907   07/20/24 0730  ceFAZolin  (ANCEF ) IVPB 1 g/50 mL premix  Status:  Discontinued        1 g 100 mL/hr over 30 Minutes Intravenous  Once 07/18/24 1402 07/21/24 0720       Assessment/Plan: POD 2 open LIH repair, incarcerated -regular diet -oob, pulm toilet - will put follow up appt and rx for  pain control in  Joe [name] 07/23/2024

## 2024-07-23 NOTE — Progress Notes (Addendum)
 LVAD Coordinator Rounding Note:   Admitted 07/18/24 for heparin  bridge and optimization prior to his Hernia repair with DR Ebbie posted for 07/21/24.    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   Pt sitting up on side of the bed. Finished eating breakfast. He states that he is sore, but feels ok. Feels pain is well managed with PRN PO pain medication. INR is 1.2 today.     Vital signs: Temp: 98.0 HR: 75 Doppler Pressure: not documented Automatic BP: 102/78 (86) O2 Sat: UTO% RA Wt: 323.3>321>322.4 lbs     LVAD interrogation reveals:  Speed: 5800 Flow: 5.5 Power: 4.6 w PI: 3.1  Alarms: none Events: none  Hematocrit: 38  Fixed speed: 5800 Low speed limit: 5500   Drive Line: CDI. Drive line anchor secure. Continue weekly dressing changes by nurse champion or VAD Coordinator. Next dressing change 07/25/24.    Labs:  LDH trend: 267>215>297>264  Hgb trend: 13.9>14.1>14>12.3   INR trend: 1.6>1.3>1.2>1.2  Anticoagulation Plan: -INR Goal: 2.0-2.5 -ASA Dose: 81 mg stop date of 06/02/23 - Heparin  drip per pharmacy   Device: -St Jude BiV - Demand rate decreased to 60 05/16/23 -Therapies: ON    Adverse Events on VAD:  Drips:  Heparin  500 units/hr   Plan/Recommendations:  1. Page VAD coordinators for any equipment or drive line issues 2. Weekly drive line dressing changes per nurse champion or VAD Coordinator  Isaiah Knoll RN VAD Coordinator  Office: [phone]  24/7 Pager: [phone]

## 2024-07-23 NOTE — Discharge Instructions (Signed)
 [facility]  UMBILICAL OR INGUINAL HERNIA REPAIR: POST OP INSTRUCTIONS  Always review your discharge instruction sheet given to you by the facility where your surgery was performed. IF YOU HAVE DISABILITY OR FAMILY LEAVE FORMS, YOU MUST BRING THEM TO THE OFFICE FOR PROCESSING.   DO NOT GIVE THEM TO YOUR DOCTOR.  A  prescription for pain medication may be given to you upon discharge.  Take your pain medication as prescribed, if needed.  If narcotic pain medicine is not needed, then you may take acetaminophen (Tylenol), naprosyn (Alleve) or ibuprofen (Advil) as needed. Take your usually prescribed medications unless otherwise directed. If you need a refill on your pain medication, please contact your pharmacy.  They will contact our office to request authorization. Prescriptions will not be filled after 5 pm or on week-ends. You should follow a light diet the first 24 hours after arrival home, such as soup and crackers, etc.  Be sure to include lots of fluids daily.  Resume your normal diet the day after surgery. Most patients will experience some swelling and bruising around the umbilicus or in the groin and scrotum.  Ice packs and reclining will help.  Swelling and bruising can take several days to resolve.  It is common to experience some constipation if taking pain medication after surgery.  Increasing fluid intake and taking a stool softener (such as Colace) will usually help or prevent this problem from occurring.  A mild laxative (Milk of Magnesia or Miralax) should be taken according to package directions if there are no bowel movements after 48 hours. Unless discharge instructions indicate otherwise, you may remove your bandages 48 hours after surgery, and you may shower at that time.  You may have steri-strips (small skin tapes) in place directly over the incision.  These strips should be left on the skin for 7-10 days and will come off on their own.  If your surgeon used  skin glue on the incision, you may shower in 24 hours.  The glue will flake off over the next 2-3 weeks.  Any sutures or staples will be removed at the office during your follow-up visit. ACTIVITIES:  You may resume regular (light) daily activities beginning the next day--such as daily self-care, walking, climbing stairs--gradually increasing activities as tolerated.  You may have sexual intercourse when it is comfortable.  Refrain from any heavy lifting or straining until approved by your doctor. You may drive when you are no longer taking prescription pain medication, you can comfortably wear a seatbelt, and you can safely maneuver your car and apply brakes. RETURN TO WORK:  __________________________________________________________ Bonita Quin should see your doctor in the office for a follow-up appointment approximately 2-3 weeks after your surgery.  Make sure that you call for this appointment within a day or two after you arrive home to insure a convenient appointment time. OTHER INSTRUCTIONS:  __________________________________________________________________________________________________________________________________________________________________________________________  WHEN TO CALL YOUR DOCTOR: Fever over 101.0 Inability to urinate Nausea and/or vomiting Extreme swelling or bruising Continued bleeding from incision. Increased pain, redness, or drainage from the incision  The clinic staff is available to answer your questions during regular business hours.  Please don't hesitate to call and ask to speak to one of the nurses for clinical concerns.  If you have a medical emergency, go to the nearest emergency room or call 911.  A surgeon from [facility] is always on call at the hospital   [address], [address], [place], [place]  [address] ?  [address] [address], [place], [place]   [address] [phone] ? [phone] ? FAX [phone] Web site:  [email or web address]

## 2024-07-24 LAB — GLUCOSE, CAPILLARY
Glucose-Capillary: 268 mg/dL — ABNORMAL HIGH (ref 70–99)
Glucose-Capillary: 360 mg/dL — ABNORMAL HIGH (ref 70–99)
Glucose-Capillary: 243 mg/dL — ABNORMAL HIGH (ref 70–99)
Glucose-Capillary: 311 mg/dL — ABNORMAL HIGH (ref 70–99)

## 2024-07-24 LAB — PROTIME-INR
INR: 1.5 — ABNORMAL HIGH (ref 0.8–1.2)
INR: 1.5 — ABNORMAL HIGH (ref 0.8–1.2)
Prothrombin Time: 18.5 s — ABNORMAL HIGH (ref 11.4–15.2)
Prothrombin Time: 19.2 s — ABNORMAL HIGH (ref 11.4–15.2)

## 2024-07-24 LAB — DIGOXIN LEVEL: Digoxin Level: 0.7 ng/mL — ABNORMAL LOW (ref 0.8–2.0)

## 2024-07-24 LAB — LACTATE DEHYDROGENASE: LDH: 331 U/L — ABNORMAL HIGH (ref 105–235)

## 2024-07-24 LAB — HEPARIN LEVEL (UNFRACTIONATED): Heparin Unfractionated: <0.1 [IU]/mL — ABNORMAL LOW (ref 0.30–0.70)

## 2024-07-24 MED ORDER — WARFARIN SODIUM 5 MG PO TABS
10.0000 mg | ORAL_TABLET | Freq: Once | ORAL | Status: AC
  Administered 2024-07-24: 10 mg via ORAL
  Filled 2024-07-24: qty 2

## 2024-07-24 MED ORDER — INSULIN ASPART 100 UNIT/ML IJ SOLN
5.0000 [IU] | Freq: Three times a day (TID) | INTRAMUSCULAR | Status: DC
  Administered 2024-07-24 – 2024-07-25 (×3): 5 [IU] via SUBCUTANEOUS
  Filled 2024-07-24 (×3): qty 5

## 2024-07-24 MED ORDER — FUROSEMIDE 10 MG/ML IJ SOLN
60.0000 mg | Freq: Once | INTRAMUSCULAR | Status: AC
  Administered 2024-07-24: 60 mg via INTRAVENOUS
  Filled 2024-07-24: qty 6

## 2024-07-24 MED ORDER — INSULIN GLARGINE-YFGN 100 UNIT/ML ~~LOC~~ SOLN
40.0000 [IU] | Freq: Every day | SUBCUTANEOUS | Status: DC
  Administered 2024-07-25: 40 [IU] via SUBCUTANEOUS
  Filled 2024-07-24: qty 0.4

## 2024-07-24 NOTE — Inpatient Diabetes Management (Addendum)
 Inpatient Diabetes Program Recommendations  AACE/ADA: New Consensus Statement on Inpatient Glycemic Control (2015)  Target Ranges:  Prepandial:   less than 140 mg/dL      Peak postprandial:   less than 180 mg/dL (1-2 hours)      Critically ill patients:  140 - 180 mg/dL   Lab Results  Component Value Date   GLUCAP 268 (H) 07/24/2024   HGBA1C 8.0 (H) 07/18/2024    Review of Glycemic Control  Latest Reference Range & Units 07/23/24 11:16 07/23/24 11:33 07/23/24 16:38 07/23/24 20:58 07/24/24 05:57  Glucose-Capillary 70 - 99 mg/dL 598 (H) 629 (H) 698 (H) 303 (H) 268 (H)   Diabetes history: DM 2 Outpatient Diabetes medications:  Jardiance  25 mg daily Tresiba 20 units daily Metformin  XR 1000 mg bid Mounjaro 10 mg weekly Current orders for Inpatient glycemic control:  Novolog  0-15 units tid with meals and HS Lantus  30 units daily Novolog  3 units tid with meals Inpatient Diabetes Program Recommendations:    Consider increasing Lantus  to 40 units daily and increase Novolog  meal coverage to 5 units tid with meals.   Thanks,  Randall Bullocks, RN, BC-ADM Inpatient Diabetes Coordinator Pager [phone]  (8a-5p)

## 2024-07-24 NOTE — Progress Notes (Signed)
 PHARMACY - ANTICOAGULATION CONSULT NOTE  Pharmacy Consult for heparin   Indication: LVAD  Allergies[1]  Patient Measurements: Height: 6' 3 (190.5 cm) Weight: (!) 147.1 kg (324 lb 4.8 oz) IBW/kg (Calculated) : 84.5 HEPARIN  DW (KG): 117.6  Vital Signs: Temp: 97.7 F (36.5 C) (02/05 0810) Temp Source: Oral (02/05 0810) BP: 95/71 (02/05 0810) Pulse Rate: 77 (02/05 0810)  Labs: Recent Labs    07/22/24 0139 07/23/24 0213 07/24/24 0807  HGB 14.0 12.7*  --   HCT 40.9 37.5*  --   PLT 257 203  --   LABPROT 15.4* 16.1* 18.5*  INR 1.2 1.2 1.5*  HEPARINUNFRC  --  <0.10* <0.10*  CREATININE 1.24 1.27*  --     Estimated Creatinine Clearance: 104.2 mL/min (A) (by C-G formula based on SCr of 1.27 mg/dL (H)).   Medical History: Past Medical History:  Diagnosis Date   Abnormal EKG    Acute systolic heart failure (HCC) 09/06/2017   Angina pectoris 09/05/2017   Body mass index 45.0-49.9, adult (HCC)    CAD S/P percutaneous coronary angioplasty 09/07/2017   mLAD and dLAD PCI with DES 09/06/17   CHF (congestive heart failure) (HCC)    Chronic systolic (congestive) heart failure (HCC) 03/19/2019   Complication of anesthesia 1995   had hard time waking up; breathing w/vasectomy   Coronary artery disease    Erectile dysfunction    Essential hypertension, benign    Fatigue    GERD (gastroesophageal reflux disease)    Gout    High cholesterol    just started tx yesterday (09/06/2017)   History of gout    RX prn (09/06/2017)   Ischemic cardiomyopathy 09/07/2017   EF 25-35%   Muscular chest pain    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   Below Left Nare (MOH's)   Obesity    Obstructive sleep apnea    OSA on CPAP    Plantar fasciitis    Pre-operative clearance 02/11/2018   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/02/2019   Saint Jude   Right bundle branch block    Shortness of breath 09/05/2017   Testosterone deficiency    Type 2 diabetes  mellitus with complication, without long-term current use of insulin  (HCC) 09/05/2017   Type II diabetes mellitus (HCC)    started tx 08/28/2017    Assessment: 54 yo M with LVAD admitted for open inguinal hernia repair on 2/2. Warfarin held preop and low dose IV heparin  started.  Heparin  and warfarin resumed 2/3. INR up to 1.5, heparin  level remains <0.1, CBC stable. LDH up slightly.  Home warfarin dose is 10mg  Wed/Fri and 7.5mg  AODs, INRs have been low as an outpatient over last several checks.  Goal of Therapy:  INR 2-2.5  Monitor platelets by anticoagulation protocol: Yes   Plan:  Continue heparin  500 units/h Warfarin 10mg  x1 tonight - if discharges, recommend 10mg  x1 tonight then resume home regimen noted above w/ 10mg  Friday Daily INR, heparin  level, CBC   Ozell Jamaica, PharmD, BCPS, [facility] Clinical Pharmacist [phone] Please check AMION for all [facility] numbers 07/24/2024        [1]  Allergies Allergen Reactions   Tramadol  Other (See Comments)    Hallucinations

## 2024-07-24 NOTE — Progress Notes (Signed)
 Phlebotomy did not draw AM labs, this writer put order in for STAT lab draw.

## 2024-07-24 NOTE — Plan of Care (Signed)
   Problem: Clinical Measurements: Goal: Will remain free from infection Outcome: Progressing Goal: Diagnostic test results will improve Outcome: Progressing Goal: Respiratory complications will improve Outcome: Progressing Goal: Cardiovascular complication will be avoided Outcome: Progressing   Problem: Activity: Goal: Risk for activity intolerance will decrease Outcome: Progressing   Problem: Nutrition: Goal: Adequate nutrition will be maintained Outcome: Progressing   Problem: Coping: Goal: Level of anxiety will decrease Outcome: Progressing   Problem: Elimination: Goal: Will not experience complications related to bowel motility Outcome: Progressing Goal: Will not experience complications related to urinary retention Outcome: Progressing   Problem: Pain Managment: Goal: General experience of comfort will improve and/or be controlled Outcome: Progressing   Problem: Safety: Goal: Ability to remain free from injury will improve Outcome: Progressing   Problem: Skin Integrity: Goal: Risk for impaired skin integrity will decrease Outcome: Progressing

## 2024-07-24 NOTE — Progress Notes (Signed)
 LVAD Coordinator Rounding Note:   Admitted 07/18/24 for heparin  bridge and optimization prior to his Hernia repair with DR Ebbie posted for 07/21/24.    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   Pt walking around the room. He states that he is sore, but feels ok. Feels pain is well managed with PRN PO pain medication. INR is 1.5 today. Pt is wanting to go home today. Hosp f/u appt made for 2/16 @ 1100.    Vital signs: Temp: 97.9 HR: 83 Doppler Pressure: 100 Automatic BP: 111/60 (76) O2 Sat: UTO% RA Wt: 323.3>321>322.4>324.3 lbs     LVAD interrogation reveals:  Speed: 5800 Flow: 5.4 Power: 4.6 w PI: 2.7  Alarms: none Events: none  Hematocrit: 38  Fixed speed: 5800 Low speed limit: 5500   Drive Line: CDI. Drive line anchor secure. Continue weekly dressing changes by nurse champion or VAD Coordinator. Next dressing change 07/25/24.    Labs:  LDH trend: 267>215>297>264>333  Hgb trend: 13.9>14.1>14>12.3   INR trend: 1.6>1.3>1.2>1.2>1.5  Anticoagulation Plan: -INR Goal: 2.0-2.5 -ASA Dose: 81 mg stop date of 06/02/23 - Heparin  drip per pharmacy   Device: -St Jude BiV - Demand rate decreased to 60 05/16/23 -Therapies: ON    Adverse Events on VAD:  Drips:  Heparin  500 units/hr   Plan/Recommendations:  1. Page VAD coordinators for any equipment or drive line issues 2. Weekly drive line dressing changes per nurse champion or VAD Coordinator  Lauraine Ip RN VAD Coordinator  Office: [phone]  24/7 Pager: [phone]

## 2024-07-24 NOTE — Plan of Care (Signed)
" °  Problem: Education: Goal: Knowledge of General Education information will improve Description: Including pain rating scale, medication(s)/side effects and non-pharmacologic comfort measures Outcome: Progressing   Problem: Health Behavior/Discharge Planning: Goal: Ability to manage health-related needs will improve Outcome: Progressing   Problem: Clinical Measurements: Goal: Ability to maintain clinical measurements within normal limits will improve Outcome: Progressing Goal: Will remain free from infection Outcome: Progressing Goal: Diagnostic test results will improve Outcome: Progressing Goal: Respiratory complications will improve Outcome: Progressing Goal: Cardiovascular complication will be avoided Outcome: Progressing   Problem: Activity: Goal: Risk for activity intolerance will decrease Outcome: Progressing   Problem: Nutrition: Goal: Adequate nutrition will be maintained Outcome: Progressing   Problem: Coping: Goal: Level of anxiety will decrease Outcome: Progressing   Problem: Elimination: Goal: Will not experience complications related to bowel motility Outcome: Progressing Goal: Will not experience complications related to urinary retention Outcome: Progressing   Problem: Pain Managment: Goal: General experience of comfort will improve and/or be controlled Outcome: Progressing   Problem: Safety: Goal: Ability to remain free from injury will improve Outcome: Progressing   Problem: Skin Integrity: Goal: Risk for impaired skin integrity will decrease Outcome: Progressing   Problem: Education: Goal: Patient will understand all VAD equipment and how it functions Outcome: Progressing Goal: Patient will be able to verbalize current INR target range and antiplatelet therapy for discharge home Outcome: Progressing   Problem: Cardiac: Goal: LVAD will function as expected and patient will experience no clinical alarms Outcome: Progressing   Problem:  Education: Goal: Ability to describe self-care measures that may prevent or decrease complications (Diabetes Survival Skills Education) will improve Outcome: Progressing Goal: Individualized Educational Video(s) Outcome: Progressing   Problem: Education: Goal: Ability to describe self-care measures that may prevent or decrease complications (Diabetes Survival Skills Education) will improve Outcome: Progressing Goal: Individualized Educational Video(s) Outcome: Progressing   Problem: Coping: Goal: Ability to adjust to condition or change in health will improve Outcome: Progressing   "

## 2024-07-24 NOTE — Progress Notes (Addendum)
 Patient ID: Joe [name], male   DOB: [birth year], 54 y.o.   MRN: [id number]     Advanced Heart Failure VAD Rounding Note  Cardiologist: Lamar Fitch, MD  AHF: Dr. Cherrie   Patient Profile:    54 y/o male w/ chronic systolic heart failure s/p HMIII LVAD implanted on 05/03/23 by Dr.Vantrigt under destination therapy criteria due to obesity, admitted for heparin  bridge and optimization prior to elective inguinal hernia repair.    Significant Events:    2/2: s/p inguinal hernia repair   Subjective:   POD# 3. Feeling better from post op standpoint. Surgical pain improved. Ambulating w/o difficulty.   Back on coumadin  w/ heparin  gtt. INR 1.5 today   LDH bump 264>>331   No chest pain or dyspnea. Eager to go home.   LVAD Interrogation HM 3: on battery  Speed: 5800 Flow: 5.4 PI: 2.9 Power: 4.6  MAP 79   Objective:   Weight Range: (!) 147.1 kg Body mass index is 40.53 kg/m.   Vital Signs:   Temp:  [97.5 F (36.4 C)-99.1 F (37.3 C)] 97.7 F (36.5 C) (02/05 0810) Pulse Rate:  [74-81] 77 (02/05 0810) Resp:  [12-20] 16 (02/05 0810) BP: (90-123)/(63-86) 95/71 (02/05 0810) SpO2:  [94 %-96 %] 95 % (02/05 0601) Weight:  [147.1 kg] 147.1 kg (02/05 0601) Last BM Date : 07/23/24  Weight change: Filed Weights   07/22/24 0621 07/23/24 0419 07/24/24 0601  Weight: (!) 143.7 kg (!) 146.2 kg (!) 147.1 kg    Intake/Output:   Intake/Output Summary (Last 24 hours) at 07/24/2024 1022 Last data filed at 07/24/2024 0800 Gross per 24 hour  Intake 1039.99 ml  Output --  Net 1039.99 ml     Physical Exam   GENERAL: no acute distress. HEENT: normal  NECK: Supple, JVP not elevated .   CARDIAC:  Mechanical heart sounds with LVAD hum present.  LUNGS:  Clear to auscultation bilaterally.  ABDOMEN:  obese, soft, round, nontender, positive bowel sounds x4.     LVAD exit site:   Dressing dry and intact.  No erythema or drainage.  Stabilization device present and accurately  applied.  EXTREMITIES:  Warm and dry, no cyanosis, clubbing or rash. Mild b/l LEE NEUROLOGIC:  Alert and oriented x 4.  Gait steady.  No aphasia.  No dysarthria.  Affect pleasant.       Telemetry   V paced 70s, personally reviewed   Labs   CBC Recent Labs    07/22/24 0139 07/23/24 0213  WBC 13.6* 12.3*  HGB 14.0 12.7*  HCT 40.9 37.5*  MCV 90.9 92.1  PLT 257 203   Basic Metabolic Panel Recent Labs    97/96/73 0139 07/23/24 0213  NA 136 135  K 4.3 3.8  CL 96* 94*  CO2 27 30  GLUCOSE 255* 218*  BUN 18 23*  CREATININE 1.24 1.27*  CALCIUM  8.6* 8.7*   Liver Function Tests No results for input(s): AST, ALT, ALKPHOS, BILITOT, PROT, ALBUMIN  in the last 72 hours. No results for input(s): LIPASE, AMYLASE in the last 72 hours. Cardiac Enzymes No results for input(s): CKTOTAL, CKMB, CKMBINDEX, TROPONINI in the last 72 hours.  BNP: BNP (last 3 results) No results for input(s): BNP in the last 8760 hours.  ProBNP (last 3 results) No results for input(s): PROBNP in the last 8760 hours.   D-Dimer No results for input(s): DDIMER in the last 72 hours. Hemoglobin A1C No results for input(s): HGBA1C in the last 72 hours.  Fasting  Lipid Panel No results for input(s): CHOL, HDL, LDLCALC, TRIG, CHOLHDL, LDLDIRECT in the last 72 hours. Medications:   Scheduled Medications:  allopurinol   100 mg Oral q AM   amiodarone   200 mg Oral Daily   Chlorhexidine  Gluconate Cloth  6 each Topical Daily   digoxin   0.125 mg Oral Daily   escitalopram   10 mg Oral QHS   ezetimibe   10 mg Oral Daily   gabapentin   300 mg Oral TID   insulin  aspart  0-15 Units Subcutaneous TID WC   insulin  aspart  0-5 Units Subcutaneous QHS   insulin  aspart  3 Units Subcutaneous TID WC   insulin  glargine-yfgn  30 Units Subcutaneous Daily   levothyroxine   25 mcg Oral QAC breakfast   mexiletine  250 mg Oral BID   multivitamin with minerals  1 tablet Oral q AM    pantoprazole   40 mg Oral Daily   polyethylene glycol  17 g Oral Daily   ranolazine   500 mg Oral Daily   rosuvastatin   40 mg Oral Daily   senna  1 tablet Oral Daily   sildenafil   20 mg Oral TID   spironolactone   25 mg Oral Daily   torsemide   60 mg Oral Daily   Warfarin - Pharmacist Dosing Inpatient   Does not apply q1600    Infusions:  heparin  500 Units/hr (07/22/24 1139)    PRN Medications: acetaminophen , ALPRAZolam , methocarbamol , oxyCODONE   Assessment/Plan    L inguinal hernia - s/p repair 2/2  - recovering well, appreciated GS    Chronic Systolic HF s/p HM3 Implantation 11/14 - due to iCM - Echo 05/01/23: EF 20%, RV moderately down (improved) - RHC (9/24): elevated biventricular filling pressures with low PAPi; CI 2.2 - S/p HM III LVAD 05/03/23 by Dr. Obadiah - RAMP echo 11/21: Speeds left at 6000.  Speed increased to 6100 on 11/27. Speed increased to 6200 on 05/18/23.  - Continue sildenafil  and digoxin  for RV failure.  - Had been struggling with RV failure. VAD speed turned down to 5800. RHC 2/25 showed well compensated hemodynamics with dry weight 307-309  - Remains NYHA II-III (multifactorial) - Mild LEE on exam. Give 60 mg IV Lasix  x 1. Back to torsemide  60 mg daily tomorrow  - Continue spironolactone  25 mg daily  - Holding SGLT2i periop. Restart at discharge. - Has been seen at [facility] and will consider for transplant once BMI down but he is struggling to lose weight.     LVAD management - s/p HM-3 implant 05/03/23 - VAD interrogated personally. Parameters stable. - INR 1.5. Continue warfarin + heparin  bridge. Can discharge once INR 1.6 or higher - LDH bump, 264>>331. Follow  - MAPs 70s    H/o VT Arrest and Hx PVCs - had VT arrest x 2 in 10/24 - Continue amiodarone  200 mg daily and Mexiletine 250 mg bid.    CAD with chronic stable angina - s/p multiple stents to LAD and LCX - LHC (9/24) with stable 3v CAD - No ischemic chest pain.  - Continue statin.     DM2 - SSI - holding Jardiance  periop, resume prior to discharge   OSA - Not using CPAP, says machine was recalled a while ago. - Needs new device   Morbid obesity - Continues to gain weight despite Mounjaro (holding) - Body mass index is 40.41 kg/m. - Understands need to get BMI down near 35 for transplant, no change  Will discuss timing of d/c w/ MD. Either later today vs tomorrow.  I reviewed the LVAD parameters from today, and compared the results to the patient's prior recorded data.  No programming changes were made.  The LVAD is functioning within specified parameters.  The patient performs LVAD self-test daily.  LVAD interrogation was negative for any significant power changes, alarms or PI events/speed drops.  LVAD equipment check completed and is in good working order.  Back-up equipment present.   LVAD education done on emergency procedures and precautions and reviewed exit site care.  Length of Stay: [address] PA-C  07/24/2024, 10:22 AM  Advanced Heart Failure Team Pager [phone] (M-F; 7a - 5p)   Please visit [email or web address]: For overnight coverage please call cardiology fellow first. If fellow not available call Shock/ECMO MD on call.  For ECMO / Mechanical Support (Impella, IABP, LVAD) issues call Shock / ECMO MD on call.    Patient seen and examined with the above-signed Advanced Practice Provider and/or Housestaff. I personally reviewed laboratory data, imaging studies and relevant notes. I independently examined the patient and formulated the important aspects of the plan. I have edited the note to reflect any of my changes or salient points. I have personally discussed the plan with the patient and/or family.  Walking the halls. Pain controlled. Notes that scrotum is very swollen.   On heparin  INR 1.5. No bleeding   + mild edema  General:  NAD.  HEENT: normal  Neck: supple. JVP mildly elevated  Carotids 2+ bilat; no bruits. No lymphadenopathy or  thryomegaly appreciated. Cor: LVAD hum.  Lungs: Clear. Abdomen: obese soft, nontender, non-distended. No hepatosplenomegaly. No bruits or masses. Good bowel sounds. Driveline site clean. Anchor in place.  + scrotal edema Extremities: no cyanosis, clubbing, rash. Warm1+ edema  Neuro: alert & oriented x 3. No focal deficits. Moves all 4 without problem   Recovering well from hernia surgery.   INR 1.5. Will continue heparin . Can go home when INR >= 1.6 Discussed warfarin dosing with PharmD personally.  Has some volume overload. Will give one dose IV lasix .   VAD interrogated personally. Parameters stable.  Toribio Fuel, MD  10:19 PM

## 2024-07-25 ENCOUNTER — Other Ambulatory Visit (HOSPITAL_COMMUNITY): Payer: Self-pay

## 2024-07-25 LAB — PROTIME-INR
INR: 1.7 — ABNORMAL HIGH (ref 0.8–1.2)
Prothrombin Time: 20.4 s — ABNORMAL HIGH (ref 11.4–15.2)

## 2024-07-25 LAB — HEPARIN LEVEL (UNFRACTIONATED): Heparin Unfractionated: <0.1 [IU]/mL — ABNORMAL LOW (ref 0.30–0.70)

## 2024-07-25 LAB — BASIC METABOLIC PANEL WITH GFR
Anion gap: 8 (ref 5–15)
BUN: 19 mg/dL (ref 6–20)
CO2: 33 mmol/L — ABNORMAL HIGH (ref 22–32)
Calcium: 8.4 mg/dL — ABNORMAL LOW (ref 8.9–10.3)
Chloride: 92 mmol/L — ABNORMAL LOW (ref 98–111)
Creatinine, Ser: 1.33 mg/dL — ABNORMAL HIGH (ref 0.61–1.24)
GFR, Estimated: >60 mL/min
Glucose, Bld: 301 mg/dL — ABNORMAL HIGH (ref 70–99)
Potassium: 3.5 mmol/L (ref 3.5–5.1)
Sodium: 132 mmol/L — ABNORMAL LOW (ref 135–145)

## 2024-07-25 LAB — CBC
HCT: 32.6 % — ABNORMAL LOW (ref 39.0–52.0)
HCT: 32.6 % — ABNORMAL LOW (ref 39.0–52.0)
Hemoglobin: 10.8 g/dL — ABNORMAL LOW (ref 13.0–17.0)
Hemoglobin: 11.1 g/dL — ABNORMAL LOW (ref 13.0–17.0)
MCH: 30.6 pg (ref 26.0–34.0)
MCH: 31.2 pg (ref 26.0–34.0)
MCHC: 33.1 g/dL (ref 30.0–36.0)
MCHC: 34 g/dL (ref 30.0–36.0)
MCV: 92.4 fL (ref 80.0–100.0)
MCV: 91.6 fL (ref 80.0–100.0)
Platelets: 203 10*3/uL (ref 150–400)
Platelets: 217 10*3/uL (ref 150–400)
RBC: 3.53 MIL/uL — ABNORMAL LOW (ref 4.22–5.81)
RBC: 3.56 MIL/uL — ABNORMAL LOW (ref 4.22–5.81)
RDW: 15.2 % (ref 11.5–15.5)
RDW: 15.2 % (ref 11.5–15.5)
WBC: 9.5 10*3/uL (ref 4.0–10.5)
WBC: 9.3 10*3/uL (ref 4.0–10.5)
nRBC: 0.2 % (ref 0.0–0.2)
nRBC: 0.3 % — ABNORMAL HIGH (ref 0.0–0.2)

## 2024-07-25 LAB — GLUCOSE, CAPILLARY
Glucose-Capillary: 284 mg/dL — ABNORMAL HIGH (ref 70–99)
Glucose-Capillary: 355 mg/dL — ABNORMAL HIGH (ref 70–99)

## 2024-07-25 LAB — LACTATE DEHYDROGENASE: LDH: 248 U/L — ABNORMAL HIGH (ref 105–235)

## 2024-07-25 MED ORDER — INSULIN ASPART 100 UNIT/ML IJ SOLN
8.0000 [IU] | Freq: Three times a day (TID) | INTRAMUSCULAR | Status: DC
  Administered 2024-07-25: 8 [IU] via SUBCUTANEOUS
  Filled 2024-07-25: qty 8

## 2024-07-25 MED ORDER — POTASSIUM CHLORIDE CRYS ER 20 MEQ PO TBCR
40.0000 meq | EXTENDED_RELEASE_TABLET | Freq: Once | ORAL | Status: AC
  Administered 2024-07-25: 40 meq via ORAL
  Filled 2024-07-25: qty 2

## 2024-07-25 MED ORDER — WARFARIN SODIUM 5 MG PO TABS: 10.0000 mg | ORAL_TABLET | Freq: Once | ORAL | Status: DC

## 2024-07-25 MED ORDER — TORSEMIDE 20 MG PO TABS
60.0000 mg | ORAL_TABLET | Freq: Every day | ORAL | Status: DC
  Administered 2024-07-25: 60 mg via ORAL
  Filled 2024-07-25: qty 3

## 2024-07-25 NOTE — Progress Notes (Addendum)
 PHARMACY - ANTICOAGULATION CONSULT NOTE  Pharmacy Consult for heparin   Indication: LVAD  Allergies[1]  Patient Measurements: Height: 6' 3 (190.5 cm) Weight: (!) 147.6 kg (325 lb 6.4 oz) IBW/kg (Calculated) : 84.5 HEPARIN  DW (KG): 117.6  Vital Signs: Temp: 98.4 F (36.9 C) (02/06 0456) Temp Source: Oral (02/06 0456) BP: 106/68 (02/06 0456) Pulse Rate: 71 (02/06 0456)  Labs: Recent Labs    07/23/24 0213 07/24/24 0807 07/24/24 1301 07/25/24 0225  HGB 12.7*  --   --  10.8*  HCT 37.5*  --   --  32.6*  PLT 203  --   --  203  LABPROT 16.1* 18.5* 19.2* 20.4*  INR 1.2 1.5* 1.5* 1.7*  HEPARINUNFRC <0.10* <0.10*  --  <0.10*  CREATININE 1.27*  --   --  1.33*    Estimated Creatinine Clearance: 99.7 mL/min (A) (by C-G formula based on SCr of 1.33 mg/dL (H)).   Medical History: Past Medical History:  Diagnosis Date   Abnormal EKG    Acute systolic heart failure (HCC) 09/06/2017   Angina pectoris 09/05/2017   Body mass index 45.0-49.9, adult (HCC)    CAD S/P percutaneous coronary angioplasty 09/07/2017   mLAD and dLAD PCI with DES 09/06/17   CHF (congestive heart failure) (HCC)    Chronic systolic (congestive) heart failure (HCC) 03/19/2019   Complication of anesthesia 1995   had hard time waking up; breathing w/vasectomy   Coronary artery disease    Erectile dysfunction    Essential hypertension, benign    Fatigue    GERD (gastroesophageal reflux disease)    Gout    High cholesterol    just started tx yesterday (09/06/2017)   History of gout    RX prn (09/06/2017)   Ischemic cardiomyopathy 09/07/2017   EF 25-35%   Muscular chest pain    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   Below Left Nare (MOH's)   Obesity    Obstructive sleep apnea    OSA on CPAP    Plantar fasciitis    Pre-operative clearance 02/11/2018   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/02/2019   Saint Jude   Right bundle branch block    Shortness of breath  09/05/2017   Testosterone deficiency    Type 2 diabetes mellitus with complication, without long-term current use of insulin  (HCC) 09/05/2017   Type II diabetes mellitus (HCC)    started tx 08/28/2017    Assessment: 54 yo M with LVAD admitted for open inguinal hernia repair on 2/2. Warfarin held preop and low dose IV heparin  started.  Heparin  and warfarin resumed 2/3. INR up to 1.7 today, heparin  level remains <0.1, H/H down slightly.  Home warfarin dose is 10mg  Wed/Fri and 7.5mg  AODs, INRs have been low as an outpatient over last several checks.  Goal of Therapy:  INR 2-2.5  Monitor platelets by anticoagulation protocol: Yes   Plan:  Stop heparin  Warfarin 10mg  x1 tonight - if discharged resume regimen above, pt has received 3 days of boosted doses Daily INR, CBC, LDH   Ozell Jamaica, PharmD, [place], [facility] Clinical Pharmacist [phone] Please check AMION for all [facility] Pharmacy numbers 07/25/2024         [1]  Allergies Allergen Reactions   Tramadol  Other (See Comments)    Hallucinations

## 2024-07-25 NOTE — TOC Progression Note (Signed)
 Transition of Care [facility]) - Progression Note    Patient Details  Name: Joe [name] MRN: [id number] Date of Birth: 1970/06/21  Transition of [facility] CM/SW Contact  [name] Phone Number: [phone] 07/25/2024, 10:17 AM  Clinical Narrative:   HF CSW called to schedule patients Hospital follow up appointment or 08/05/24 at 8:30 AM.   HF CSW/Unit CM will continue to follow and monitor for dc readiness.     Expected Discharge Plan: Home/Self Care Barriers to Discharge: Continued Medical Work up               Expected Discharge Plan and Services       Living arrangements for the past 2 months: Single Family Home                                       Social Drivers of Health (SDOH) Interventions SDOH Screenings   Food Insecurity: No Food Insecurity (07/18/2024)  Housing: Low Risk (07/18/2024)  Transportation Needs: No Transportation Needs (07/18/2024)  Utilities: Not At Risk (07/18/2024)  Depression (PHQ2-9): Low Risk (07/20/2024)  Financial Resource Strain: Low Risk (04/20/2023)  Social Connections: Unknown (10/28/2021)   Received from [facility]  Tobacco Use: Low Risk (07/21/2024)    Readmission Risk Interventions     No data to display

## 2024-07-25 NOTE — Plan of Care (Signed)

## 2024-07-25 NOTE — Progress Notes (Signed)
 LVAD Coordinator Rounding Note:   Admitted 07/18/24 for heparin  bridge and optimization prior to his Hernia repair with DR Ebbie posted for 07/21/24.    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   Pt walking around the room. He states that he is sore, but feels ok. Feels pain is well managed with PRN PO pain medication. INR is 1.7 today. Pts hgb continues to drop. He is down to 10.8 from 14 three days ago. Dr Cherrie would like this rechecked before he can go home.  Pt is wanting to go home today. Hosp f/u appt made for 2/16 @ 1100. We will f/u on lab redraw.    Vital signs: Temp: 98.4 HR: 79 Doppler Pressure: not documented Automatic BP: 108/82 (92) O2 Sat: 98% RA Wt: 323.3>321>322.4>324.3>325.4 lbs     LVAD interrogation reveals:  Speed: 5800 Flow: 5.1 Power: 4.6 w PI: 3  Alarms: none Events: none  Hematocrit: 32  Fixed speed: 5800 Low speed limit: 5500   Drive Line: CDI. Drive line anchor secure. Continue weekly dressing changes by nurse champion or VAD Coordinator. Next dressing change 07/28/24.    Labs:  LDH trend: 267>215>297>264>333  Hgb trend: 13.9>14.1>14>12.3>10.8   INR trend: 1.6>1.3>1.2>1.2>1.5>1.7  Anticoagulation Plan: -INR Goal: 2.0-2.5 -ASA Dose: 81 mg stop date of 06/02/23 - Heparin  drip per pharmacy   Device: -St Jude BiV - Demand rate decreased to 60 05/16/23 -Therapies: ON    Adverse Events on VAD:  Drips:  Heparin  500 units/hr-off   Plan/Recommendations:  1. Page VAD coordinators for any equipment or drive line issues 2. Weekly drive line dressing changes per nurse champion or VAD Coordinator  Lauraine Ip RN VAD Coordinator  Office: [phone]  24/7 Pager: [phone]

## 2024-08-04 ENCOUNTER — Ambulatory Visit (HOSPITAL_COMMUNITY): Admitting: Internal Medicine

## 2024-08-18 ENCOUNTER — Ambulatory Visit (HOSPITAL_COMMUNITY): Admitting: Internal Medicine

## 2024-10-13 ENCOUNTER — Ambulatory Visit: Payer: BC Managed Care – PPO

## 2025-01-12 ENCOUNTER — Ambulatory Visit: Payer: BC Managed Care – PPO

## 2025-04-13 ENCOUNTER — Ambulatory Visit: Payer: BC Managed Care – PPO
